# Patient Record
Sex: Female | Born: 1940 | Race: Black or African American | Hispanic: No | Smoking: Never smoker
Health system: Southern US, Community
[De-identification: ages and names within clinical notes are randomized; demographics above are authoritative.]

## PROBLEM LIST (undated history)

## (undated) DIAGNOSIS — C50919 Malignant neoplasm of unspecified site of unspecified female breast: Secondary | ICD-10-CM

## (undated) DIAGNOSIS — I2510 Atherosclerotic heart disease of native coronary artery without angina pectoris: Secondary | ICD-10-CM

## (undated) HISTORY — PX: ABDOMINAL HYSTERECTOMY: SHX81

## (undated) HISTORY — PX: BREAST SURGERY: SHX581

---

## 2005-07-01 ENCOUNTER — Ambulatory Visit (HOSPITAL_COMMUNITY): Admission: RE | Admit: 2005-07-01 | Discharge: 2005-07-01 | Payer: Self-pay | Admitting: Family Medicine

## 2006-01-06 ENCOUNTER — Ambulatory Visit (HOSPITAL_COMMUNITY): Admission: RE | Admit: 2006-01-06 | Discharge: 2006-01-06 | Payer: Self-pay | Admitting: Family Medicine

## 2006-07-14 ENCOUNTER — Ambulatory Visit (HOSPITAL_COMMUNITY): Admission: RE | Admit: 2006-07-14 | Discharge: 2006-07-14 | Payer: Self-pay | Admitting: Family Medicine

## 2007-07-18 ENCOUNTER — Ambulatory Visit (HOSPITAL_COMMUNITY): Admission: RE | Admit: 2007-07-18 | Discharge: 2007-07-18 | Payer: Self-pay | Admitting: Family Medicine

## 2007-07-26 ENCOUNTER — Encounter: Admission: RE | Admit: 2007-07-26 | Discharge: 2007-07-26 | Payer: Self-pay | Admitting: Family Medicine

## 2007-07-26 ENCOUNTER — Encounter (INDEPENDENT_AMBULATORY_CARE_PROVIDER_SITE_OTHER): Payer: Self-pay | Admitting: Diagnostic Radiology

## 2007-08-14 ENCOUNTER — Encounter: Admission: RE | Admit: 2007-08-14 | Discharge: 2007-08-14 | Payer: Self-pay | Admitting: Surgery

## 2007-08-15 ENCOUNTER — Encounter: Admission: RE | Admit: 2007-08-15 | Discharge: 2007-08-15 | Payer: Self-pay | Admitting: Surgery

## 2007-08-16 ENCOUNTER — Encounter: Admission: RE | Admit: 2007-08-16 | Discharge: 2007-08-16 | Payer: Self-pay | Admitting: Surgery

## 2007-08-16 ENCOUNTER — Encounter (INDEPENDENT_AMBULATORY_CARE_PROVIDER_SITE_OTHER): Payer: Self-pay | Admitting: Surgery

## 2007-08-16 ENCOUNTER — Ambulatory Visit (HOSPITAL_BASED_OUTPATIENT_CLINIC_OR_DEPARTMENT_OTHER): Admission: RE | Admit: 2007-08-16 | Discharge: 2007-08-16 | Payer: Self-pay | Admitting: Surgery

## 2007-08-22 ENCOUNTER — Encounter: Admission: RE | Admit: 2007-08-22 | Discharge: 2007-08-22 | Payer: Self-pay | Admitting: Surgery

## 2007-09-28 ENCOUNTER — Ambulatory Visit: Admission: RE | Admit: 2007-09-28 | Discharge: 2007-12-13 | Payer: Self-pay | Admitting: Radiation Oncology

## 2008-04-03 ENCOUNTER — Encounter: Admission: RE | Admit: 2008-04-03 | Discharge: 2008-04-03 | Payer: Self-pay | Admitting: Radiation Oncology

## 2008-05-01 ENCOUNTER — Encounter: Admission: RE | Admit: 2008-05-01 | Discharge: 2008-05-01 | Payer: Self-pay | Admitting: Radiation Oncology

## 2008-06-18 ENCOUNTER — Ambulatory Visit: Payer: Self-pay | Admitting: Gastroenterology

## 2008-06-18 ENCOUNTER — Ambulatory Visit (HOSPITAL_COMMUNITY): Admission: RE | Admit: 2008-06-18 | Discharge: 2008-06-18 | Payer: Self-pay | Admitting: Gastroenterology

## 2008-07-18 ENCOUNTER — Encounter: Admission: RE | Admit: 2008-07-18 | Discharge: 2008-07-18 | Payer: Self-pay | Admitting: Radiation Oncology

## 2009-07-25 ENCOUNTER — Ambulatory Visit (HOSPITAL_COMMUNITY): Admission: RE | Admit: 2009-07-25 | Discharge: 2009-07-25 | Payer: Self-pay | Admitting: Family Medicine

## 2010-07-30 ENCOUNTER — Ambulatory Visit (HOSPITAL_COMMUNITY): Admission: RE | Admit: 2010-07-30 | Discharge: 2010-07-30 | Payer: Self-pay | Admitting: Family Medicine

## 2011-01-18 ENCOUNTER — Encounter: Payer: Self-pay | Admitting: Family Medicine

## 2011-05-12 NOTE — Op Note (Signed)
Brittany Archer, Brittany Archer            ACCOUNT NO.:  [id number]   MEDICAL RECORD NO.:  [id number]          PATIENT TYPE:  AMB   LOCATION:  DSC                          FACILITY:  [facility]   PHYSICIAN:  Thomas A. 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   PROCEDURE:  Left breast needle-localized excisional lumpectomy.   SURGEON:  Harriette Bouillon, MD   ANESTHESIA:  LMA with 20 mL of 0.25% Sensorcaine local.   ESTIMATED BLOOD LOSS:  20 mL.   SPECIMEN:  1. Left breast mass with wire calcifications and stereotactic clip,      verified by radiography.  2. Additional marginal tissue submitted.   DRAINS:  None.   INDICATIONS FOR PROCEDURE:  The patient is a 70 year old female found to  have left breast microcalcifications.  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  She presents for excision of this area in an attempt to conserve her  left breast for DCIS.  Procedure was discussed preoperatively with the  risks and indications.  She understands and agrees to proceed.   DESCRIPTION OF PROCEDURE:  The patient was brought to the operating room  and placed supine.  The left breast prepped and draped in a sterile  fashion.  A localizing wire exited the left lateral breast.  After  infiltration of local anesthesia, a curvilinear incision was made around  the wire.  The wire was brought out through the incision and the tissue  around the entire wire tip was excised in its entirety.  This was  oriented and sent for a radiograph, which showed the calcifications,  clip and wire to be present.  I took additional margins of all margins  and placed a suture on the new surgical margins.  Hemostasis was  excellent.  The wound was  closed in layers with 3-0 Vicryl; 4-  0 Monocryl used to close the subcuticular skin.  Dermabond was placed as  a dressing.  All final counts of sponge, needle and instruments were  found be correct at this portion of the case.  The patient was awoken  and taken to Recovery in satisfactory condition.      Thomas A. Cornett, M.D.  Electronically Signed     TAC/MEDQ  D:  08/16/2007  T:  08/17/2007  Job:  [id number]   cc:   [facility], [place]

## 2011-05-12 NOTE — Op Note (Signed)
Brittany Archer, Brittany Archer            ACCOUNT NO.:  [id number]   MEDICAL RECORD NO.:  [id number]          PATIENT TYPE:  AMB   LOCATION:  DAY                           FACILITY:  [facility]   PHYSICIAN:  Kassie Mends, M.D.      DATE OF BIRTH:  05/28/1941   DATE OF PROCEDURE:  DATE OF DISCHARGE:                               OPERATIVE REPORT   REFERRING Brandilee Pies:  Abbey Chatters. Jorene Guest, MD   PROCEDURE:  Colonoscopy.   INDICATION FOR EXAM:  [name] is a 70 year old female who presents  for average risk colon cancer screening.   FINDINGS:  Frequent sigmoid colon diverticula.  Large internal  hemorrhoids.  Otherwise, no polyps, masses, inflammatory changes, or  AVMs seen.   RECOMMENDATIONS:  1. Screening colonoscopy in 10 years.  She should follow high-fiber      diet.  She was given a handout on high-fiber diet, diverticulosis,      and hemorrhoids.   MEDICATIONS:  1. Demerol 50 mg IV.  2. Versed 4 mg IV.   PROCEDURE TECHNIQUE:  Physical exam was performed.  Informed consent was  obtained from the patient after explaining the benefits, risks, and  alternatives to the procedure.  The patient was connected to monitor and  placed in the left lateral position.  Continuous oxygen was provided by  nasal cannula.  IV medicine administered through an indwelling cannula.  After administration of sedation and rectal exam, the patient's rectum  was entered and the scope was advanced under direct visualization to the  cecum.  The scope was removed slowly by carefully examining the color,  texture, anatomy, and integrity of the mucosa on the way out.  The  patient was recovered in endoscopy and discharged home in satisfactory  condition.      Kassie Mends, M.D.  Electronically Signed     SM/MEDQ  D:  06/18/2008  T:  06/18/2008  Job:  [id number]   cc:   Abbey Chatters. [employer]

## 2011-06-29 ENCOUNTER — Other Ambulatory Visit (HOSPITAL_COMMUNITY): Payer: Self-pay | Admitting: Family Medicine

## 2011-06-29 DIAGNOSIS — Z09 Encounter for follow-up examination after completed treatment for conditions other than malignant neoplasm: Secondary | ICD-10-CM

## 2011-08-12 ENCOUNTER — Ambulatory Visit (HOSPITAL_COMMUNITY)
Admission: RE | Admit: 2011-08-12 | Discharge: 2011-08-12 | Disposition: A | Discharge status: Home / Self Care | Payer: Medicare Other | Source: Ambulatory Visit | Attending: Family Medicine | Admitting: Family Medicine

## 2011-08-12 DIAGNOSIS — Z09 Encounter for follow-up examination after completed treatment for conditions other than malignant neoplasm: Secondary | ICD-10-CM

## 2011-08-12 DIAGNOSIS — Z853 Personal history of malignant neoplasm of breast: Secondary | ICD-10-CM | POA: Insufficient documentation

## 2011-09-10 ENCOUNTER — Encounter: Payer: Self-pay | Admitting: *Deleted

## 2011-09-10 DIAGNOSIS — K5730 Diverticulosis of large intestine without perforation or abscess without bleeding: Secondary | ICD-10-CM | POA: Insufficient documentation

## 2011-09-10 DIAGNOSIS — E119 Type 2 diabetes mellitus without complications: Secondary | ICD-10-CM | POA: Insufficient documentation

## 2011-09-10 DIAGNOSIS — I2510 Atherosclerotic heart disease of native coronary artery without angina pectoris: Secondary | ICD-10-CM | POA: Insufficient documentation

## 2011-09-10 DIAGNOSIS — N201 Calculus of ureter: Secondary | ICD-10-CM | POA: Insufficient documentation

## 2011-09-10 DIAGNOSIS — N1330 Unspecified hydronephrosis: Secondary | ICD-10-CM | POA: Insufficient documentation

## 2011-09-10 DIAGNOSIS — Z853 Personal history of malignant neoplasm of breast: Secondary | ICD-10-CM | POA: Insufficient documentation

## 2011-09-10 NOTE — ED Notes (Signed)
Sudden onset rt flank pain with n/v

## 2011-09-11 ENCOUNTER — Emergency Department (HOSPITAL_COMMUNITY): Payer: Medicare Other

## 2011-09-11 ENCOUNTER — Encounter (HOSPITAL_COMMUNITY): Payer: Self-pay | Admitting: Emergency Medicine

## 2011-09-11 ENCOUNTER — Emergency Department (HOSPITAL_COMMUNITY)
Admission: EM | Admit: 2011-09-11 | Discharge: 2011-09-11 | Disposition: A | Discharge status: Home / Self Care | Payer: Medicare Other | Attending: Emergency Medicine | Admitting: Emergency Medicine

## 2011-09-11 DIAGNOSIS — N200 Calculus of kidney: Secondary | ICD-10-CM

## 2011-09-11 HISTORY — DX: Atherosclerotic heart disease of native coronary artery without angina pectoris: I25.10

## 2011-09-11 HISTORY — DX: Malignant neoplasm of unspecified site of unspecified female breast: C50.919

## 2011-09-11 LAB — CBC
HCT: 31.5 % — ABNORMAL LOW (ref 36.0–46.0)
Hemoglobin: 10.9 g/dL — ABNORMAL LOW (ref 12.0–15.0)
MCH: 32.7 pg (ref 26.0–34.0)
RBC: 3.33 MIL/uL — ABNORMAL LOW (ref 3.87–5.11)

## 2011-09-11 LAB — COMPREHENSIVE METABOLIC PANEL
ALT: 6 U/L (ref 0–35)
AST: 11 U/L (ref 0–37)
CO2: 23 mEq/L (ref 19–32)
Chloride: 101 mEq/L (ref 96–112)
GFR calc non Af Amer: >60 mL/min (ref >60)
Glucose, Bld: 207 mg/dL — ABNORMAL HIGH (ref 70–99)
Sodium: 136 mEq/L (ref 135–145)
Total Bilirubin: 0.3 mg/dL (ref 0.3–1.2)

## 2011-09-11 LAB — DIFFERENTIAL
Eosinophils Absolute: 0 10*3/uL (ref 0.0–0.7)
Lymphs Abs: 0.7 10*3/uL (ref 0.7–4.0)
Monocytes Absolute: 0.3 10*3/uL (ref 0.1–1.0)
Monocytes Relative: 3 % (ref 3–12)
Neutro Abs: 8.9 10*3/uL — ABNORMAL HIGH (ref 1.7–7.7)
Neutrophils Relative %: 90 % — ABNORMAL HIGH (ref 43–77)

## 2011-09-11 LAB — LIPASE, BLOOD: Lipase: 35 U/L (ref 11–59)

## 2011-09-11 LAB — URINALYSIS, ROUTINE W REFLEX MICROSCOPIC
Bilirubin Urine: NEGATIVE
Glucose, UA: 250 mg/dL — AB
Nitrite: NEGATIVE
Specific Gravity, Urine: >1.03 — ABNORMAL HIGH (ref 1.005–1.030)
pH: 6 (ref 5.0–8.0)

## 2011-09-11 MED ORDER — SODIUM CHLORIDE 0.9 % IV SOLN
Freq: Once | INTRAVENOUS | Status: AC
  Administered 2011-09-11: 01:00:00 via INTRAVENOUS

## 2011-09-11 MED ORDER — IOHEXOL 300 MG/ML  SOLN
100.0000 mL | Freq: Once | INTRAMUSCULAR | Status: AC | PRN
  Administered 2011-09-11: 100 mL via INTRAVENOUS

## 2011-09-11 MED ORDER — SODIUM CHLORIDE 0.9 % IV BOLUS (SEPSIS): 500.0000 mL | Freq: Once | INTRAVENOUS | Status: DC

## 2011-09-11 MED ORDER — MORPHINE SULFATE 4 MG/ML IJ SOLN
4.0000 mg | Freq: Once | INTRAMUSCULAR | Status: AC
  Administered 2011-09-11: 4 mg via INTRAVENOUS
  Filled 2011-09-11: qty 1

## 2011-09-11 MED ORDER — ONDANSETRON HCL 4 MG/2ML IJ SOLN
4.0000 mg | Freq: Once | INTRAMUSCULAR | Status: AC
  Administered 2011-09-11: 4 mg via INTRAVENOUS
  Filled 2011-09-11: qty 2

## 2011-09-11 MED ORDER — TAMSULOSIN HCL 0.4 MG PO CAPS: 0.4000 mg | ORAL_CAPSULE | Freq: Two times a day (BID) | ORAL | Status: DC

## 2011-09-11 MED ORDER — HYDROCODONE-ACETAMINOPHEN 5-500 MG PO TABS: 1.0000 | ORAL_TABLET | Freq: Four times a day (QID) | ORAL | Status: AC | PRN

## 2011-09-11 NOTE — ED Provider Notes (Signed)
History     CSN: [id number] Arrival date & time: 09/11/2011 12:00 AM   Chief Complaint  Patient presents with  . Flank Pain     (Include location/radiation/quality/duration/timing/severity/associated sxs/prior treatment) HPI Comments: Gradual onset of right flank and side pain with radiation to the right lower cluttered and right side. Associated with nausea and dizziness. Symptoms are constant, nothing makes better or worse. She has no associated dysuria, diarrhea, rectal bleeding. He has never had symptoms like this before and has had a C-section many years ago. No other abdominal surgeries.  Family state that she had fried some chicken tonight and had some warned up being dispensed leftovers. He denies any other symptoms such as diarrhea  Patient is a 70 y.o. female presenting with flank pain. The history is provided by the patient and a relative.  Flank Pain This is a new problem. Episode onset: 7:00 PM 4 hours prior to arrival. The problem occurs constantly. The problem has not changed since onset.Associated symptoms include abdominal pain. Pertinent negatives include no chest pain, no headaches and no shortness of breath. The symptoms are aggravated by nothing. The symptoms are relieved by nothing. She has tried nothing for the symptoms.     Past Medical History  Diagnosis Date  . Diabetes mellitus   . Coronary artery disease   . Breast cancer      Past Surgical History  Procedure Date  . Abdominal hysterectomy   . Breast surgery     History reviewed. No pertinent family history.  History  Substance Use Topics  . Smoking status: Never Smoker   . Smokeless tobacco: Not on file  . Alcohol Use: No    OB History    Grav Para Term Preterm Abortions TAB SAB Ect Mult Living                  Review of Systems  Constitutional: Negative for fever and chills.  HENT: Negative for congestion, rhinorrhea, neck pain and neck stiffness.   Eyes: Negative for visual  disturbance.  Respiratory: Negative for shortness of breath.   Cardiovascular: Negative for chest pain.  Gastrointestinal: Positive for nausea and abdominal pain. Negative for vomiting, diarrhea, constipation and blood in stool.  Genitourinary: Positive for flank pain.  Musculoskeletal: Negative for back pain.  Neurological: Positive for dizziness. Negative for weakness and headaches.  Hematological: Negative for adenopathy.  All other systems reviewed and are negative.    Allergies  Motrin  Home Medications   Current Outpatient Rx  Name Route Sig Dispense Refill  . ASPIRIN 81 MG PO TABS Oral Take 81 mg by mouth daily.      Marland Kitchen GLIPIZIDE 5 MG PO TABS Oral Take 5 mg by mouth 1 day or 1 dose.      Marland Kitchen LISINOPRIL-HYDROCHLOROTHIAZIDE 10-12.5 MG PO TABS Oral Take 1 tablet by mouth daily.      Marland Kitchen METFORMIN HCL 1000 MG PO TABS Oral Take 1,000 mg by mouth 2 times daily at 12 noon and 4 pm.        Physical Exam    BP 137/43  Pulse 57  Temp(Src) 97.6 F (36.4 C) (Oral)  Resp 20  Ht 5\' 3"  (1.6 m)  Wt 184 lb (83.462 kg)  BMI 32.59 kg/m2  SpO2 96%  Physical Exam  Nursing note and vitals reviewed. Constitutional: She appears well-developed and well-nourished. No distress.  HENT:  Head: Normocephalic and atraumatic.  Mouth/Throat: Oropharynx is clear and moist. No oropharyngeal exudate.  Eyes: Conjunctivae  and EOM are normal. Pupils are equal, round, and reactive to light. Right eye exhibits no discharge. Left eye exhibits no discharge. No scleral icterus.  Neck: Normal range of motion. Neck supple. No JVD present. No thyromegaly present.  Cardiovascular: Normal rate, regular rhythm, normal heart sounds and intact distal pulses.  Exam reveals no gallop and no friction rub.   No murmur heard. Pulmonary/Chest: Effort normal and breath sounds normal. No respiratory distress. She has no wheezes. She has no rales.  Abdominal: Soft. Bowel sounds are normal. She exhibits no distension and no  mass. There is tenderness (Tender to palpation on the right side of the mid abdomen. This part of the abdomen is tympanitic to percussion. There is minimal pain in the right upper cluttered and no pain to the right lower cautery to palpation. No left-sided abdominal tenderness,).       Non-peritoneal, decreased bowel sounds  Musculoskeletal: Normal range of motion. She exhibits no edema and no tenderness.  Lymphadenopathy:    She has no cervical adenopathy.  Neurological: She is alert. Coordination normal.       Clear speech, follows commands, no nystagmus.  Skin: Skin is warm and dry. No rash noted. No erythema.  Psychiatric: She has a normal mood and affect. Her behavior is normal.    ED Course  Procedures  Results for orders placed during the hospital encounter of 09/11/11  URINALYSIS, ROUTINE W REFLEX MICROSCOPIC      Component Value Range   Color, Urine YELLOW  YELLOW    Appearance CLEAR  CLEAR    Specific Gravity, Urine >1.030 (*) 1.005 - 1.030    pH 6.0  5.0 - 8.0    Glucose, UA 250 (*) NEGATIVE (mg/dL)   Hgb urine dipstick LARGE (*) NEGATIVE    Bilirubin Urine NEGATIVE  NEGATIVE    Ketones, ur 15 (*) NEGATIVE (mg/dL)   Protein, ur NEGATIVE  NEGATIVE (mg/dL)   Urobilinogen, UA 0.2  0.0 - 1.0 (mg/dL)   Nitrite NEGATIVE  NEGATIVE    Leukocytes, UA NEGATIVE  NEGATIVE   CBC      Component Value Range   WBC 10.0  4.0 - 10.5 (K/uL)   RBC 3.33 (*) 3.87 - 5.11 (MIL/uL)   Hemoglobin 10.9 (*) 12.0 - 15.0 (g/dL)   HCT 16.1 (*) 09.6 - 46.0 (%)   MCV 94.6  78.0 - 100.0 (fL)   MCH 32.7  26.0 - 34.0 (pg)   MCHC 34.6  30.0 - 36.0 (g/dL)   RDW 04.5  40.9 - 81.1 (%)   Platelets 232  150 - 400 (K/uL)  DIFFERENTIAL      Component Value Range   Neutrophils Relative 90 (*) 43 - 77 (%)   Neutro Abs 8.9 (*) 1.7 - 7.7 (K/uL)   Lymphocytes Relative 7 (*) 12 - 46 (%)   Lymphs Abs 0.7  0.7 - 4.0 (K/uL)   Monocytes Relative 3  3 - 12 (%)   Monocytes Absolute 0.3  0.1 - 1.0 (K/uL)    Eosinophils Relative 0  0 - 5 (%)   Eosinophils Absolute 0.0  0.0 - 0.7 (K/uL)   Basophils Relative 0  0 - 1 (%)   Basophils Absolute 0.0  0.0 - 0.1 (K/uL)  LACTIC ACID, PLASMA      Component Value Range   Lactic Acid, Venous 2.8 (*) 0.5 - 2.2 (mmol/L)  LIPASE, BLOOD      Component Value Range   Lipase 35  11 - 59 (U/L)  COMPREHENSIVE METABOLIC PANEL      Component Value Range   Sodium 136  135 - 145 (mEq/L)   Potassium 3.6  3.5 - 5.1 (mEq/L)   Chloride 101  96 - 112 (mEq/L)   CO2 23  19 - 32 (mEq/L)   Glucose, Bld 207 (*) 70 - 99 (mg/dL)   BUN 20  6 - 23 (mg/dL)   Creatinine, Ser 6.96  0.50 - 1.10 (mg/dL)   Calcium 9.8  8.4 - 29.5 (mg/dL)   Total Protein 7.6  6.0 - 8.3 (g/dL)   Albumin 3.8  3.5 - 5.2 (g/dL)   AST 11  0 - 37 (U/L)   ALT 6  0 - 35 (U/L)   Alkaline Phosphatase 70  39 - 117 (U/L)   Total Bilirubin 0.3  0.3 - 1.2 (mg/dL)   GFR calc non Af Amer >60  >60 (mL/min)   GFR calc Af Amer >60  >60 (mL/min)  URINE MICROSCOPIC-ADD ON      Component Value Range   Squamous Epithelial / LPF MANY (*) RARE    WBC, UA 0-2  <3 (WBC/hpf)   RBC / HPF 3-6  <3 (RBC/hpf)   Bacteria, UA RARE  RARE      MDM Patient with right-sided abdominal pain constant for the last 4 hours, possibly associated with tainted food. Will need lab work do to tenderness and tympanitic sounds. CT scan to rule out other sources. IV placed, fluids, antiemetics, analgesics.  IMPRESSION:  1. Moderate right-sided hydronephrosis, with diffuse right-sided perinephric fluid and stranding, and an obstructing 3 mm stone at the right vesicoureteral junction. 2. Small left renal cyst and possible small hepatic cyst noted.  3. Diffuse diverticulosis along the distal descending and proximal sigmoid colon, without evidence of diverticulitis.  Original Report Authenticated By: Tonia Ghent, M.D.  Findings discussed with patient, medications given, and improved status and will followup as needed.    Vida Roller, MD 09/11/11 [phone]

## 2011-09-11 NOTE — ED Notes (Signed)
Pt states pain in abd started around 7 pm, has vomited several times since then and is now also dizzy.  Pt denies diarrhea.

## 2011-10-06 ENCOUNTER — Other Ambulatory Visit (HOSPITAL_COMMUNITY): Payer: Self-pay | Admitting: Urology

## 2011-10-06 DIAGNOSIS — IMO0002 Reserved for concepts with insufficient information to code with codable children: Secondary | ICD-10-CM

## 2011-10-09 LAB — COMPREHENSIVE METABOLIC PANEL
ALT: 12
AST: 19
Albumin: 3.9
Alkaline Phosphatase: 72
BUN: 6
CO2: 29
Calcium: 9.9
Chloride: 104
Creatinine, Ser: 0.66
GFR calc Af Amer: >60
GFR calc non Af Amer: >60
Glucose, Bld: 105 — ABNORMAL HIGH
Potassium: 4.7
Sodium: 140
Total Bilirubin: 0.7
Total Protein: 7.8

## 2011-10-09 LAB — CBC
HCT: 34.7 — ABNORMAL LOW
Hemoglobin: 12.1
MCHC: 34.8
MCV: 93.1
Platelets: 258
RBC: 3.73 — ABNORMAL LOW
RDW: 13.2
WBC: 4.3

## 2011-10-09 LAB — DIFFERENTIAL
Eosinophils Absolute: 0.1
Lymphocytes Relative: 38
Lymphs Abs: 1.7
Neutrophils Relative %: 55

## 2011-10-20 ENCOUNTER — Ambulatory Visit (HOSPITAL_COMMUNITY)
Admission: RE | Admit: 2011-10-20 | Discharge: 2011-10-20 | Disposition: A | Discharge status: Home / Self Care | Payer: Medicare Other | Source: Ambulatory Visit | Attending: Urology | Admitting: Urology

## 2011-10-20 ENCOUNTER — Encounter (HOSPITAL_COMMUNITY): Payer: Self-pay

## 2011-10-20 DIAGNOSIS — N201 Calculus of ureter: Secondary | ICD-10-CM | POA: Insufficient documentation

## 2011-10-20 DIAGNOSIS — R109 Unspecified abdominal pain: Secondary | ICD-10-CM | POA: Insufficient documentation

## 2011-10-20 DIAGNOSIS — Z853 Personal history of malignant neoplasm of breast: Secondary | ICD-10-CM | POA: Insufficient documentation

## 2011-10-20 DIAGNOSIS — IMO0002 Reserved for concepts with insufficient information to code with codable children: Secondary | ICD-10-CM

## 2011-10-20 DIAGNOSIS — K5730 Diverticulosis of large intestine without perforation or abscess without bleeding: Secondary | ICD-10-CM | POA: Insufficient documentation

## 2012-07-28 ENCOUNTER — Other Ambulatory Visit (HOSPITAL_COMMUNITY): Payer: Self-pay | Admitting: Nurse Practitioner

## 2012-07-28 DIAGNOSIS — Z9889 Other specified postprocedural states: Secondary | ICD-10-CM

## 2012-08-17 ENCOUNTER — Other Ambulatory Visit (HOSPITAL_COMMUNITY): Payer: Self-pay | Admitting: Nurse Practitioner

## 2012-08-17 ENCOUNTER — Ambulatory Visit (HOSPITAL_COMMUNITY)
Admission: RE | Admit: 2012-08-17 | Discharge: 2012-08-17 | Disposition: A | Discharge status: Home / Self Care | Payer: Medicare Other | Source: Ambulatory Visit | Attending: Nurse Practitioner | Admitting: Nurse Practitioner

## 2012-08-17 DIAGNOSIS — Z853 Personal history of malignant neoplasm of breast: Secondary | ICD-10-CM | POA: Insufficient documentation

## 2012-08-17 DIAGNOSIS — Z9889 Other specified postprocedural states: Secondary | ICD-10-CM

## 2012-08-17 DIAGNOSIS — C50912 Malignant neoplasm of unspecified site of left female breast: Secondary | ICD-10-CM

## 2013-08-16 ENCOUNTER — Encounter (HOSPITAL_COMMUNITY): Payer: Medicare Other

## 2013-08-23 ENCOUNTER — Ambulatory Visit (HOSPITAL_COMMUNITY)
Admission: RE | Admit: 2013-08-23 | Discharge: 2013-08-23 | Disposition: A | Discharge status: Home / Self Care | Payer: Medicare Other | Source: Ambulatory Visit | Attending: Nurse Practitioner | Admitting: Nurse Practitioner

## 2013-08-23 DIAGNOSIS — C50912 Malignant neoplasm of unspecified site of left female breast: Secondary | ICD-10-CM

## 2013-08-23 DIAGNOSIS — Z853 Personal history of malignant neoplasm of breast: Secondary | ICD-10-CM | POA: Insufficient documentation

## 2014-07-20 ENCOUNTER — Other Ambulatory Visit (HOSPITAL_COMMUNITY): Payer: Self-pay | Admitting: Family Medicine

## 2014-07-20 DIAGNOSIS — C50912 Malignant neoplasm of unspecified site of left female breast: Secondary | ICD-10-CM

## 2014-08-28 ENCOUNTER — Ambulatory Visit (HOSPITAL_COMMUNITY)
Admission: RE | Admit: 2014-08-28 | Discharge: 2014-08-28 | Disposition: A | Discharge status: Home / Self Care | Payer: Medicare Other | Source: Ambulatory Visit | Attending: Family Medicine | Admitting: Family Medicine

## 2014-08-28 DIAGNOSIS — Z853 Personal history of malignant neoplasm of breast: Secondary | ICD-10-CM | POA: Insufficient documentation

## 2014-08-28 DIAGNOSIS — C50912 Malignant neoplasm of unspecified site of left female breast: Secondary | ICD-10-CM

## 2014-08-28 DIAGNOSIS — Z9010 Acquired absence of unspecified breast and nipple: Secondary | ICD-10-CM | POA: Diagnosis not present

## 2015-07-26 ENCOUNTER — Other Ambulatory Visit (HOSPITAL_COMMUNITY): Payer: Self-pay | Admitting: Family Medicine

## 2015-07-26 DIAGNOSIS — Z1231 Encounter for screening mammogram for malignant neoplasm of breast: Secondary | ICD-10-CM

## 2015-09-04 ENCOUNTER — Ambulatory Visit (HOSPITAL_COMMUNITY)
Admission: RE | Admit: 2015-09-04 | Discharge: 2015-09-04 | Disposition: A | Discharge status: Home / Self Care | Payer: Medicare Other | Source: Ambulatory Visit | Attending: Family Medicine | Admitting: Family Medicine

## 2015-09-04 DIAGNOSIS — Z853 Personal history of malignant neoplasm of breast: Secondary | ICD-10-CM | POA: Insufficient documentation

## 2015-09-04 DIAGNOSIS — Z923 Personal history of irradiation: Secondary | ICD-10-CM | POA: Diagnosis not present

## 2015-09-04 DIAGNOSIS — Z1231 Encounter for screening mammogram for malignant neoplasm of breast: Secondary | ICD-10-CM

## 2016-08-03 ENCOUNTER — Other Ambulatory Visit (HOSPITAL_COMMUNITY): Payer: Self-pay | Admitting: Physician Assistant

## 2016-08-03 DIAGNOSIS — Z1231 Encounter for screening mammogram for malignant neoplasm of breast: Secondary | ICD-10-CM

## 2016-09-07 ENCOUNTER — Ambulatory Visit (HOSPITAL_COMMUNITY)
Admission: RE | Admit: 2016-09-07 | Discharge: 2016-09-07 | Disposition: A | Discharge status: Home / Self Care | Payer: Medicare Other | Source: Ambulatory Visit | Attending: Physician Assistant | Admitting: Physician Assistant

## 2016-09-07 DIAGNOSIS — Z1231 Encounter for screening mammogram for malignant neoplasm of breast: Secondary | ICD-10-CM | POA: Diagnosis present

## 2017-07-28 ENCOUNTER — Other Ambulatory Visit (HOSPITAL_COMMUNITY): Payer: Self-pay | Admitting: Physician Assistant

## 2017-07-28 DIAGNOSIS — Z1231 Encounter for screening mammogram for malignant neoplasm of breast: Secondary | ICD-10-CM

## 2017-09-15 ENCOUNTER — Ambulatory Visit (HOSPITAL_COMMUNITY)
Admission: RE | Admit: 2017-09-15 | Discharge: 2017-09-15 | Disposition: A | Discharge status: Home / Self Care | Payer: Medicare Other | Source: Ambulatory Visit | Attending: Physician Assistant | Admitting: Physician Assistant

## 2017-09-15 DIAGNOSIS — Z1231 Encounter for screening mammogram for malignant neoplasm of breast: Secondary | ICD-10-CM | POA: Diagnosis present

## 2017-09-19 ENCOUNTER — Emergency Department (HOSPITAL_COMMUNITY)
Admission: EM | Admit: 2017-09-19 | Discharge: 2017-09-19 | Disposition: A | Discharge status: Home / Self Care | Payer: Medicare Other | Attending: Emergency Medicine | Admitting: Emergency Medicine

## 2017-09-19 DIAGNOSIS — L089 Local infection of the skin and subcutaneous tissue, unspecified: Secondary | ICD-10-CM | POA: Insufficient documentation

## 2017-09-19 DIAGNOSIS — Z7984 Long term (current) use of oral hypoglycemic drugs: Secondary | ICD-10-CM | POA: Diagnosis not present

## 2017-09-19 DIAGNOSIS — Z853 Personal history of malignant neoplasm of breast: Secondary | ICD-10-CM | POA: Diagnosis not present

## 2017-09-19 DIAGNOSIS — E119 Type 2 diabetes mellitus without complications: Secondary | ICD-10-CM | POA: Diagnosis not present

## 2017-09-19 DIAGNOSIS — Z7982 Long term (current) use of aspirin: Secondary | ICD-10-CM | POA: Insufficient documentation

## 2017-09-19 DIAGNOSIS — I2510 Atherosclerotic heart disease of native coronary artery without angina pectoris: Secondary | ICD-10-CM | POA: Diagnosis not present

## 2017-09-19 DIAGNOSIS — R600 Localized edema: Secondary | ICD-10-CM | POA: Diagnosis present

## 2017-09-19 LAB — COMPREHENSIVE METABOLIC PANEL
ALBUMIN: 4.3 g/dL (ref 3.5–5.0)
ALK PHOS: 67 U/L (ref 38–126)
ALT: 11 U/L — ABNORMAL LOW (ref 14–54)
ANION GAP: 12 (ref 5–15)
AST: 17 U/L (ref 15–41)
BILIRUBIN TOTAL: 0.5 mg/dL (ref 0.3–1.2)
BUN: 18 mg/dL (ref 6–20)
CALCIUM: 10.3 mg/dL (ref 8.9–10.3)
CO2: 25 mmol/L (ref 22–32)
Chloride: 102 mmol/L (ref 101–111)
Creatinine, Ser: 1.02 mg/dL — ABNORMAL HIGH (ref 0.44–1.00)
GFR calc Af Amer: 60 mL/min — ABNORMAL LOW (ref >60)
GFR, EST NON AFRICAN AMERICAN: 52 mL/min — AB (ref >60)
GLUCOSE: 94 mg/dL (ref 65–99)
Potassium: 3.9 mmol/L (ref 3.5–5.1)
Sodium: 139 mmol/L (ref 135–145)
Total Protein: 8.7 g/dL — ABNORMAL HIGH (ref 6.5–8.1)

## 2017-09-19 LAB — CBC WITH DIFFERENTIAL/PLATELET
Basophils Absolute: 0 10*3/uL (ref 0.0–0.1)
Basophils Relative: 0 %
Eosinophils Absolute: 0.1 10*3/uL (ref 0.0–0.7)
Eosinophils Relative: 1 %
HEMATOCRIT: 32.6 % — AB (ref 36.0–46.0)
HEMOGLOBIN: 10.7 g/dL — AB (ref 12.0–15.0)
LYMPHS ABS: 2 10*3/uL (ref 0.7–4.0)
LYMPHS PCT: 19 %
MCH: 32.8 pg (ref 26.0–34.0)
MCHC: 32.8 g/dL (ref 30.0–36.0)
MCV: 100 fL (ref 78.0–100.0)
MONOS PCT: 9 %
Monocytes Absolute: 0.9 10*3/uL (ref 0.1–1.0)
NEUTROS PCT: 71 %
Neutro Abs: 7.8 10*3/uL — ABNORMAL HIGH (ref 1.7–7.7)
Platelets: 275 10*3/uL (ref 150–400)
RBC: 3.26 MIL/uL — ABNORMAL LOW (ref 3.87–5.11)
RDW: 14.8 % (ref 11.5–15.5)
WBC: 10.8 10*3/uL — AB (ref 4.0–10.5)

## 2017-09-19 MED ORDER — ACETAMINOPHEN 325 MG PO TABS
650.0000 mg | ORAL_TABLET | Freq: Once | ORAL | Status: AC
  Administered 2017-09-19: 650 mg via ORAL
  Filled 2017-09-19: qty 2

## 2017-09-19 MED ORDER — LIDOCAINE HCL (PF) 1 % IJ SOLN
INTRAMUSCULAR | Status: AC
  Administered 2017-09-19: 20:00:00
  Filled 2017-09-19: qty 4

## 2017-09-19 MED ORDER — DIPHENHYDRAMINE HCL 50 MG/ML IJ SOLN
25.0000 mg | Freq: Once | INTRAMUSCULAR | Status: AC
  Administered 2017-09-19: 25 mg via INTRAVENOUS
  Filled 2017-09-19: qty 1

## 2017-09-19 MED ORDER — SULFAMETHOXAZOLE-TRIMETHOPRIM 800-160 MG PO TABS: 1.0000 | ORAL_TABLET | Freq: Two times a day (BID) | ORAL | 0 refills | Status: AC

## 2017-09-19 MED ORDER — FAMOTIDINE IN NACL 20-0.9 MG/50ML-% IV SOLN
20.0000 mg | Freq: Once | INTRAVENOUS | Status: AC
  Administered 2017-09-19: 20 mg via INTRAVENOUS
  Filled 2017-09-19: qty 50

## 2017-09-19 MED ORDER — CEFTRIAXONE SODIUM 1 G IJ SOLR
1.0000 g | Freq: Once | INTRAMUSCULAR | Status: AC
  Administered 2017-09-19: 1 g via INTRAMUSCULAR
  Filled 2017-09-19: qty 10

## 2017-09-19 NOTE — ED Notes (Signed)
Dr Roderic Palau notified of pt's temp, additional orders received,

## 2017-09-19 NOTE — Discharge Instructions (Signed)
Follow-up with your doctor in 2-3 days for recheck return if getting worse

## 2017-09-19 NOTE — ED Triage Notes (Signed)
Patient c/o oral swelling that started yesterday. Per patient hit one of her teeth 2 days ago. Denies any fevers. Significant swelling noted to left jaw. Denies any difficulty swallowing or breathing. Patient also has some notable orbital swelling.

## 2017-09-19 NOTE — ED Provider Notes (Signed)
[facility] DEPT Provider Note   CSN: [id number] Arrival date & time: 09/19/17  1422     History   Chief Complaint Chief Complaint  Patient presents with  . Oral Swelling    HPI Brittany Archer is a 76 y.o. female.  Patient complains of swelling around her left side of her nose and left eye. Patient also has some tenderness   The history is provided by the patient.  Illness  This is a new problem. The problem occurs constantly. The problem has not changed since onset.Pertinent negatives include no chest pain, no abdominal pain and no headaches. Nothing aggravates the symptoms. Nothing relieves the symptoms.    Past Medical History:  Diagnosis Date  . Breast cancer    left breast/ 2008/ surg/ rad tx  . Coronary artery disease   . Diabetes mellitus     There are no active problems to display for this patient.   Past Surgical History:  Procedure Laterality Date  . ABDOMINAL HYSTERECTOMY    . BREAST SURGERY      OB History    No data available       Home Medications    Prior to Admission medications   Medication Sig Start Date End Date Taking? Authorizing Provider  acetaminophen (TYLENOL) 500 MG tablet Take 500 mg by mouth every 6 (six) hours as needed for mild pain or moderate pain.   Yes [provider]  aspirin 81 MG tablet Take 81 mg by mouth daily.     Yes [provider]  glipiZIDE (GLUCOTROL) 5 MG tablet Take 5 mg by mouth daily.    Yes [provider]  lisinopril-hydrochlorothiazide (PRINZIDE,ZESTORETIC) 10-12.5 MG per tablet Take 1 tablet by mouth daily.     Yes [provider]  metFORMIN (GLUCOPHAGE) 1000 MG tablet Take 1,000 mg by mouth 2 times daily at 12 noon and 4 pm.     Yes [provider]  sulfamethoxazole-trimethoprim (BACTRIM DS,SEPTRA DS) 800-160 MG tablet Take 1 tablet by mouth 2 (two) times daily. 09/19/17 09/26/17  Milton Ferguson, MD    Family History No family history on file.  Social  History Social History  Substance Use Topics  . Smoking status: Never Smoker  . Smokeless tobacco: Not on file  . Alcohol use No     Allergies   Motrin [ibuprofen]   Review of Systems Review of Systems  Constitutional: Negative for appetite change and fatigue.  HENT: Negative for congestion, ear discharge and sinus pressure.        Facial swelling  Eyes: Negative for discharge.  Respiratory: Negative for cough.   Cardiovascular: Negative for chest pain.  Gastrointestinal: Negative for abdominal pain and diarrhea.  Genitourinary: Negative for frequency and hematuria.  Musculoskeletal: Negative for back pain.  Skin: Negative for rash.  Neurological: Negative for seizures and headaches.  Psychiatric/Behavioral: Negative for hallucinations.     Physical Exam Updated Vital Signs BP (!) 160/69   Pulse 69   Temp 98.4 F (36.9 C) (Oral)   Resp 18   Ht 5\' 3"  (1.6 m)   Wt 81.8 kg (180 lb 6.4 oz)   SpO2 95%   BMI 31.96 kg/m   Physical Exam  Constitutional: She is oriented to person, place, and time. She appears well-developed.  HENT:  Head: Normocephalic.  Patient has swelling and tenderness to left side of nose which appears to be mild cellulitis  Eyes: Conjunctivae and EOM are normal. No scleral icterus.  Neck: Neck supple. No  thyromegaly present.  Cardiovascular: Normal rate and regular rhythm.  Exam reveals no gallop and no friction rub.   No murmur heard. Pulmonary/Chest: No stridor. She has no wheezes. She has no rales. She exhibits no tenderness.  Abdominal: She exhibits no distension. There is no tenderness. There is no rebound.  Musculoskeletal: Normal range of motion. She exhibits no edema.  Lymphadenopathy:    She has no cervical adenopathy.  Neurological: She is oriented to person, place, and time. She exhibits normal muscle tone. Coordination normal.  Skin: No rash noted. No erythema.  Psychiatric: She has a normal mood and affect. Her behavior is normal.       ED Treatments / Results  Labs (all labs ordered are listed, but only abnormal results are displayed) Labs Reviewed  CBC WITH DIFFERENTIAL/PLATELET - Abnormal; Notable for the following:       Result Value   WBC 10.8 (*)    RBC 3.26 (*)    Hemoglobin 10.7 (*)    HCT 32.6 (*)    Neutro Abs 7.8 (*)    All other components within normal limits  COMPREHENSIVE METABOLIC PANEL - Abnormal; Notable for the following:    Creatinine, Ser 1.02 (*)    Total Protein 8.7 (*)    ALT 11 (*)    GFR calc non Af Amer 52 (*)    GFR calc Af Amer 60 (*)    All other components within normal limits    EKG  EKG Interpretation None       Radiology No results found.  Procedures Procedures (including critical care time)  Medications Ordered in ED Medications  famotidine (PEPCID) IVPB 20 mg premix (0 mg Intravenous Stopped 09/19/17 1619)  diphenhydrAMINE (BENADRYL) injection 25 mg (25 mg Intravenous Given 09/19/17 1549)     Initial Impression / Assessment and Plan / ED Course  I have reviewed the triage vital signs and the nursing notes.  Pertinent labs & imaging results that were available during my care of the patient were reviewed by me and considered in my medical decision making (see chart for details).     Patient with cellulitis to the fascial be placed on Bactrim  Final Clinical Impressions(s) / ED Diagnoses   Final diagnoses:  Facial infection    New Prescriptions New Prescriptions   SULFAMETHOXAZOLE-TRIMETHOPRIM (BACTRIM DS,SEPTRA DS) 800-160 MG TABLET    Take 1 tablet by mouth 2 (two) times daily.     Milton Ferguson, MD 09/19/17 [phone]

## 2017-11-25 ENCOUNTER — Encounter (HOSPITAL_COMMUNITY): Payer: Self-pay | Admitting: Oncology

## 2017-11-25 ENCOUNTER — Other Ambulatory Visit: Payer: Self-pay

## 2017-11-25 ENCOUNTER — Encounter (HOSPITAL_COMMUNITY): Payer: Medicare Other | Attending: Oncology | Admitting: Oncology

## 2017-11-25 ENCOUNTER — Encounter (HOSPITAL_COMMUNITY): Payer: Medicare Other

## 2017-11-25 DIAGNOSIS — D508 Other iron deficiency anemias: Secondary | ICD-10-CM | POA: Diagnosis not present

## 2017-11-25 DIAGNOSIS — N183 Chronic kidney disease, stage 3 (moderate): Secondary | ICD-10-CM | POA: Diagnosis not present

## 2017-11-25 DIAGNOSIS — D509 Iron deficiency anemia, unspecified: Secondary | ICD-10-CM | POA: Insufficient documentation

## 2017-11-25 LAB — COMPREHENSIVE METABOLIC PANEL
ALT: 7 U/L — ABNORMAL LOW (ref 14–54)
AST: 14 U/L — AB (ref 15–41)
Albumin: 4.2 g/dL (ref 3.5–5.0)
Alkaline Phosphatase: 69 U/L (ref 38–126)
Anion gap: 9 (ref 5–15)
BILIRUBIN TOTAL: 0.6 mg/dL (ref 0.3–1.2)
BUN: 22 mg/dL — AB (ref 6–20)
CHLORIDE: 106 mmol/L (ref 101–111)
CO2: 26 mmol/L (ref 22–32)
CREATININE: 1.09 mg/dL — AB (ref 0.44–1.00)
Calcium: 10.5 mg/dL — ABNORMAL HIGH (ref 8.9–10.3)
GFR calc non Af Amer: 48 mL/min — ABNORMAL LOW (ref >60)
GFR, EST AFRICAN AMERICAN: 55 mL/min — AB (ref >60)
Glucose, Bld: 61 mg/dL — ABNORMAL LOW (ref 65–99)
POTASSIUM: 3.4 mmol/L — AB (ref 3.5–5.1)
Sodium: 141 mmol/L (ref 135–145)
TOTAL PROTEIN: 8.5 g/dL — AB (ref 6.5–8.1)

## 2017-11-25 LAB — CBC WITH DIFFERENTIAL/PLATELET
BASOS ABS: 0 10*3/uL (ref 0.0–0.1)
Basophils Relative: 0 %
EOS PCT: 1 %
Eosinophils Absolute: 0.1 10*3/uL (ref 0.0–0.7)
HEMATOCRIT: 29.2 % — AB (ref 36.0–46.0)
Hemoglobin: 9.4 g/dL — ABNORMAL LOW (ref 12.0–15.0)
LYMPHS ABS: 1.9 10*3/uL (ref 0.7–4.0)
LYMPHS PCT: 35 %
MCH: 32.4 pg (ref 26.0–34.0)
MCHC: 32.2 g/dL (ref 30.0–36.0)
MCV: 100.7 fL — AB (ref 78.0–100.0)
MONO ABS: 0.3 10*3/uL (ref 0.1–1.0)
MONOS PCT: 6 %
Neutro Abs: 3.2 10*3/uL (ref 1.7–7.7)
Neutrophils Relative %: 58 %
PLATELETS: 269 10*3/uL (ref 150–400)
RBC: 2.9 MIL/uL — ABNORMAL LOW (ref 3.87–5.11)
RDW: 14.5 % (ref 11.5–15.5)
WBC: 5.5 10*3/uL (ref 4.0–10.5)

## 2017-11-25 LAB — VITAMIN B12: Vitamin B-12: 164 pg/mL — ABNORMAL LOW (ref 180–914)

## 2017-11-25 LAB — RETICULOCYTES
RBC.: 2.9 MIL/uL — AB (ref 3.87–5.11)
RETIC CT PCT: 1.3 % (ref 0.4–3.1)
Retic Count, Absolute: 37.7 10*3/uL (ref 19.0–186.0)

## 2017-11-25 LAB — FERRITIN: Ferritin: 26 ng/mL (ref 11–307)

## 2017-11-25 LAB — IRON AND TIBC
IRON: 42 ug/dL (ref 28–170)
Saturation Ratios: 23 % (ref 10.4–31.8)
TIBC: 183 ug/dL — AB (ref 250–450)
UIBC: 141 ug/dL

## 2017-11-25 LAB — FOLATE: Folate: 16.2 ng/mL (ref >5.9)

## 2017-11-25 NOTE — Patient Instructions (Signed)
[place] at [facility] Discharge Instructions  RECOMMENDATIONS MADE BY THE CONSULTANT AND ANY TEST RESULTS WILL BE SENT TO YOUR REFERRING PHYSICIAN.  You were seen today by Dr. Twana First You will have lab work today Follow up for 2 iron infusions We will see you back in 8 weeks with labs   Thank you for choosing [place] at [facility] to provide your oncology and hematology care.  To afford each patient quality time with our provider, please arrive at least 15 minutes before your scheduled appointment time.    If you have a lab appointment with the [facility] please come in thru the  Main Entrance and check in at the main information desk  You need to re-schedule your appointment should you arrive 10 or more minutes late.  We strive to give you quality time with our providers, and arriving late affects you and other patients whose appointments are after yours.  Also, if you no show three or more times for appointments you may be dismissed from the clinic at the providers discretion.     Again, thank you for choosing [facility].  Our hope is that these requests will decrease the amount of time that you wait before being seen by our physicians.       _____________________________________________________________  Should you have questions after your visit to [facility], please contact our office at [phone] between the hours of 8:30 a.m. and 4:30 p.m.  Voicemails left after 4:30 p.m. will not be returned until the following business day.  For prescription refill requests, have your pharmacy contact our office.       Resources For Cancer Patients and their Caregivers ? American Cancer Society: Can assist with transportation, wigs, general needs, runs Look Good Feel Better.        224-661-4704 ? Cancer Care: Provides financial assistance, online support groups, medication/co-pay assistance.   1-800-813-HOPE (260) 036-7099) ? [facility] Assists [place] cancer patients and their families through emotional , educational and financial support.  228-752-6322 ? [place] DSS Where to apply for food stamps, Medicaid and utility assistance. (786) 572-6468 ? [facility]: Transportation to medical appointments. 612-857-6507 ? Social Security Administration: May apply for disability if have a Stage IV cancer. 306-606-8818 4431328959 ? [employer], Disability and Transit Services: Assists with nutrition, care and transit needs. [place] Support Programs: @10RELATIVEDAYS @ > Cancer Support Group  2nd Tuesday of the month 1pm-2pm, Journey Room  > Creative Journey  3rd Tuesday of the month 1130am-1pm, Journey Room  > Look Good Feel Better  1st Wednesday of the month 10am-12 noon, Journey Room (Call [place] to register [phone])

## 2017-11-25 NOTE — Progress Notes (Signed)
[facility] Initial Visit:  Patient Care Team: Vesta Mixer as PCP - General (Physician Assistant)  CHIEF COMPLAINTS/PURPOSE OF CONSULTATION:  Iron deficiency anemia  HISTORY OF PRESENTING ILLNESS: Brittany Archer 76 y.o. female presents today for evaluation of iron deficiency anemia.  She states that she has been doing well and has no complaints today.  She denies any evidence of bleeding including melena, hematochezia, hematuria, hemoptysis.  Her last colonoscopy was more than 5 years ago.  She denies any fatigue, chest pain, shortness of breath, abdominal pain, recent infections, nausea, vomiting, diarrhea.  Blood work on 11/03/2017 demonstrated ferritin 25, serum iron 46, iron binding capacity 152, percent saturation 30, WBC 5.6K, hemoglobin 9.3 g/dL, hematocrit 27.4%, MCV 95.5, platelet count 252K.  Previous CBC from 09/19/2017 demonstrated hemoglobin 10.7 g/dL, hematocrit 32.6%.  Review of Systems - Oncology ROS as per HPI otherwise 12 point ROS is negative.  MEDICAL HISTORY: Past Medical History:  Diagnosis Date  . Breast cancer    left breast/ 2008/ surg/ rad tx  . Coronary artery disease   . Diabetes mellitus     SURGICAL HISTORY: Past Surgical History:  Procedure Laterality Date  . ABDOMINAL HYSTERECTOMY    . BREAST SURGERY      SOCIAL HISTORY: Social History   Socioeconomic History  . Marital status: Divorced    Spouse name: Not on file  . Number of children: Not on file  . Years of education: Not on file  . Highest education level: Not on file  Social Needs  . Financial resource strain: Not on file  . Food insecurity - worry: Not on file  . Food insecurity - inability: Not on file  . Transportation needs - medical: Not on file  . Transportation needs - non-medical: Not on file  Occupational History  . Not on file  Tobacco Use  . Smoking status: Never Smoker  Substance and Sexual Activity  . Alcohol use: No  . Drug use: No   . Sexual activity: Yes    Birth control/protection: Surgical  Other Topics Concern  . Not on file  Social History Narrative  . Not on file    FAMILY HISTORY No family history on file.  ALLERGIES:  is allergic to motrin [ibuprofen].  MEDICATIONS:  Current Outpatient Medications  Medication Sig Dispense Refill  . acetaminophen (TYLENOL) 500 MG tablet Take 500 mg by mouth every 6 (six) hours as needed for mild pain or moderate pain.    Marland Kitchen aspirin 81 MG tablet Take 81 mg by mouth daily.      Marland Kitchen lisinopril-hydrochlorothiazide (PRINZIDE,ZESTORETIC) 10-12.5 MG per tablet Take 1 tablet by mouth daily.      . metFORMIN (GLUCOPHAGE) 1000 MG tablet Take 1,000 mg by mouth 2 times daily at 12 noon and 4 pm.       No current facility-administered medications for this visit.     PHYSICAL EXAMINATION:   Vitals:   11/25/17 1314  BP: (!) 124/57  Pulse: 63  Resp: 16  Temp: 98.4 F (36.9 C)  SpO2: 99%    Filed Weights   11/25/17 1314  Weight: 178 lb 4.8 oz (80.9 kg)     Physical Exam  Constitutional: Well-developed, well-nourished, and in no distress.   HENT:  Head: Normocephalic and atraumatic.  Mouth/Throat: No oropharyngeal exudate. Mucosa moist. Eyes: Pupils are equal, round, and reactive to light. Conjunctivae are normal. No scleral icterus.  Neck: Normal range of motion. Neck supple. No JVD present.  Cardiovascular: Normal rate, regular rhythm and normal heart sounds.  Exam reveals no gallop and no friction rub.   No murmur heard. Pulmonary/Chest: Effort normal and breath sounds normal. No respiratory distress. No wheezes.No rales.  Abdominal: Soft. Bowel sounds are normal. No distension. There is no tenderness. There is no guarding.  Musculoskeletal: No edema or tenderness.  Lymphadenopathy:    No cervical or supraclavicular adenopathy.  Neurological: Alert and oriented to person, place, and time. No cranial nerve deficit.  Skin: Skin is warm and dry. No rash noted. No  erythema. No pallor.  Psychiatric: Affect and judgment normal.    LABORATORY DATA: I have personally reviewed the data as listed:  No visits with results within 1 Month(s) from this visit.  Latest known visit with results is:  Admission on 09/19/2017, Discharged on 09/19/2017  Component Date Value Ref Range Status  . WBC 09/19/2017 10.8* 4.0 - 10.5 K/uL Final  . RBC 09/19/2017 3.26* 3.87 - 5.11 MIL/uL Final  . Hemoglobin 09/19/2017 10.7* 12.0 - 15.0 g/dL Final  . HCT 09/19/2017 32.6* 36.0 - 46.0 % Final  . MCV 09/19/2017 100.0  78.0 - 100.0 fL Final  . MCH 09/19/2017 32.8  26.0 - 34.0 pg Final  . MCHC 09/19/2017 32.8  30.0 - 36.0 g/dL Final  . RDW 09/19/2017 14.8  11.5 - 15.5 % Final  . Platelets 09/19/2017 275  150 - 400 K/uL Final  . Neutrophils Relative % 09/19/2017 71  % Final  . Neutro Abs 09/19/2017 7.8* 1.7 - 7.7 K/uL Final  . Lymphocytes Relative 09/19/2017 19  % Final  . Lymphs Abs 09/19/2017 2.0  0.7 - 4.0 K/uL Final  . Monocytes Relative 09/19/2017 9  % Final  . Monocytes Absolute 09/19/2017 0.9  0.1 - 1.0 K/uL Final  . Eosinophils Relative 09/19/2017 1  % Final  . Eosinophils Absolute 09/19/2017 0.1  0.0 - 0.7 K/uL Final  . Basophils Relative 09/19/2017 0  % Final  . Basophils Absolute 09/19/2017 0.0  0.0 - 0.1 K/uL Final  . Sodium 09/19/2017 139  135 - 145 mmol/L Final  . Potassium 09/19/2017 3.9  3.5 - 5.1 mmol/L Final  . Chloride 09/19/2017 102  101 - 111 mmol/L Final  . CO2 09/19/2017 25  22 - 32 mmol/L Final  . Glucose, Bld 09/19/2017 94  65 - 99 mg/dL Final  . BUN 09/19/2017 18  6 - 20 mg/dL Final  . Creatinine, Ser 09/19/2017 1.02* 0.44 - 1.00 mg/dL Final  . Calcium 09/19/2017 10.3  8.9 - 10.3 mg/dL Final  . Total Protein 09/19/2017 8.7* 6.5 - 8.1 g/dL Final  . Albumin 09/19/2017 4.3  3.5 - 5.0 g/dL Final  . AST 09/19/2017 17  15 - 41 U/L Final  . ALT 09/19/2017 11* 14 - 54 U/L Final  . Alkaline Phosphatase 09/19/2017 67  38 - 126 U/L Final  . Total  Bilirubin 09/19/2017 0.5  0.3 - 1.2 mg/dL Final  . GFR calc non Af Amer 09/19/2017 52* >60 mL/min Final  . GFR calc Af Amer 09/19/2017 60* >60 mL/min Final   Comment: (NOTE) The eGFR has been calculated using the CKD EPI equation. This calculation has not been validated in all clinical situations. eGFR's persistently <60 mL/min signify possible Chronic Kidney Disease.   . Anion gap 09/19/2017 12  5 - 15 Final    RADIOGRAPHIC STUDIES: I have personally reviewed the radiological images as listed and agree with the findings in the report  No results found.  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 Will call patient with any abnormal labs. -Will set patient up for 2 doses of IV feraheme for iron replacement. -RTC in 2 months to assess response to feraheme. CBC, CMP, iron studies on next visit.  Orders Placed This Encounter  Procedures  . CBC with Differential    Standing Status:   Future    Standing Expiration Date:   11/25/2018  . Comprehensive metabolic panel    Standing Status:   Future    Standing Expiration Date:   11/25/2018  . Erythropoietin    Standing Status:   Future    Standing Expiration Date:   11/25/2018  . Iron and TIBC    Standing Status:   Future    Standing Expiration Date:   11/25/2018  . Vitamin B12    Standing Status:   Future    Standing Expiration Date:   11/25/2018  . Ferritin    Standing Status:   Future    Standing Expiration Date:   11/25/2018  . Folate    Standing Status:   Future    Standing Expiration Date:   11/25/2018  . Multiple Myeloma Panel (SPEP&IFE w/QIG)    Standing Status:   Future    Standing Expiration Date:   11/25/2018  . Kappa/lambda light chains    Standing Status:   Future    Standing Expiration Date:   11/25/2018  . Hemoglobinopathy evaluation    Standing Status:   Future    Standing Expiration Date:   11/25/2018  . Reticulocytes     Standing Status:   Future    Standing Expiration Date:   11/25/2018    All questions were answered. The patient knows to call the clinic with any problems, questions or concerns.  This note was electronically signed.    Twana First, MD  11/25/2017 1:15 PM

## 2017-11-26 ENCOUNTER — Other Ambulatory Visit (HOSPITAL_COMMUNITY): Payer: Self-pay | Admitting: Oncology

## 2017-11-26 LAB — ERYTHROPOIETIN: ERYTHROPOIETIN: 18.7 m[IU]/mL — AB (ref 2.6–18.5)

## 2017-11-29 ENCOUNTER — Encounter (HOSPITAL_COMMUNITY): Payer: Self-pay

## 2017-11-29 ENCOUNTER — Encounter (HOSPITAL_COMMUNITY): Payer: Medicare Other | Attending: Oncology

## 2017-11-29 ENCOUNTER — Other Ambulatory Visit: Payer: Self-pay

## 2017-11-29 VITALS — BP 122/64 | HR 70 | Temp 98.9°F | Resp 20

## 2017-11-29 DIAGNOSIS — D509 Iron deficiency anemia, unspecified: Secondary | ICD-10-CM

## 2017-11-29 DIAGNOSIS — D508 Other iron deficiency anemias: Secondary | ICD-10-CM | POA: Insufficient documentation

## 2017-11-29 LAB — MULTIPLE MYELOMA PANEL, SERUM
ALPHA2 GLOB SERPL ELPH-MCNC: 0.8 g/dL (ref 0.4–1.0)
Albumin SerPl Elph-Mcnc: 3.5 g/dL (ref 2.9–4.4)
Albumin/Glob SerPl: 0.9 (ref 0.7–1.7)
Alpha 1: 0.2 g/dL (ref 0.0–0.4)
B-Globulin SerPl Elph-Mcnc: 1.7 g/dL — ABNORMAL HIGH (ref 0.7–1.3)
Gamma Glob SerPl Elph-Mcnc: 1.3 g/dL (ref 0.4–1.8)
Globulin, Total: 4 g/dL — ABNORMAL HIGH (ref 2.2–3.9)
IGG (IMMUNOGLOBIN G), SERUM: 1202 mg/dL (ref 700–1600)
IGM (IMMUNOGLOBULIN M), SRM: 52 mg/dL (ref 26–217)
IgA: 897 mg/dL — ABNORMAL HIGH (ref 64–422)
M Protein SerPl Elph-Mcnc: 0.9 g/dL — ABNORMAL HIGH
TOTAL PROTEIN ELP: 7.5 g/dL (ref 6.0–8.5)

## 2017-11-29 LAB — HEMOGLOBINOPATHY EVALUATION
HGB F QUANT: 0 % (ref 0.0–2.0)
HGB S QUANTITAION: 0 %
Hgb A2 Quant: 2.1 % (ref 1.8–3.2)
Hgb A: 97.9 % (ref 96.4–98.8)
Hgb C: 0 %
Hgb Variant: 0 %

## 2017-11-29 LAB — KAPPA/LAMBDA LIGHT CHAINS
KAPPA, LAMDA LIGHT CHAIN RATIO: 123.43 — AB (ref 0.26–1.65)
Kappa free light chain: 2048.9 mg/L — ABNORMAL HIGH (ref 3.3–19.4)
Lambda free light chains: 16.6 mg/L (ref 5.7–26.3)

## 2017-11-29 MED ORDER — SODIUM CHLORIDE 0.9 % IV SOLN
INTRAVENOUS | Status: DC
  Administered 2017-11-29: 14:00:00 via INTRAVENOUS

## 2017-11-29 MED ORDER — SODIUM CHLORIDE 0.9 % IV SOLN
510.0000 mg | Freq: Once | INTRAVENOUS | Status: AC
  Administered 2017-11-29: 510 mg via INTRAVENOUS
  Filled 2017-11-29: qty 17

## 2017-11-29 MED ORDER — CYANOCOBALAMIN 1000 MCG/ML IJ SOLN
1000.0000 ug | INTRAMUSCULAR | Status: DC
  Administered 2017-11-29: 1000 ug via INTRAMUSCULAR
  Filled 2017-11-29: qty 1

## 2017-11-29 NOTE — Progress Notes (Signed)
Tolerated infusion w/o adverse reaction.  Alert, in no distress; VSS; discharged ambulatory in c/o family.

## 2017-12-06 ENCOUNTER — Ambulatory Visit (HOSPITAL_COMMUNITY): Payer: Medicare Other

## 2017-12-08 NOTE — Progress Notes (Signed)
[facility], [place] [address]   CLINIC:  Medical Oncology/Hematology  PCP:  Vesta Mixer [address] [place] [address] [phone]   REASON FOR VISIT:  Follow-up for Anemia  CURRENT THERAPY: Initial work-up    HISTORY OF PRESENT ILLNESS:  (From Dr. Laverle Patter note on 11/25/17)      INTERVAL HISTORY:  [name] 76 y.o. female returns for routine follow-up for anemia.   Here today with her sister.   Received her first dose of IV Feraheme and her first vitamin B12 injection on 11/29/17. Tolerated both medications well.  She states that she feels well overall; appetite 100% and energy levels 75%. States that she may have noticed a bit of improvement in her energy levels after her initial iron infusion, "but my energy levels have never been bad!"    Denies any frank bleeding episodes including blood in her stools, dark/tarry stools, hematuria, vaginal bleeding, nosebleeds, or gingival bleeding.  Denies any focal bone pain or arthralgias.    She is largely without complaints today.     REVIEW OF SYSTEMS:  Review of Systems  Constitutional: Negative.  Negative for chills, fatigue and fever.  HENT:  Negative.  Negative for nosebleeds.   Eyes: Negative.   Respiratory: Negative.  Negative for cough and shortness of breath.   Cardiovascular: Negative.  Negative for chest pain and leg swelling.  Gastrointestinal: Negative.  Negative for abdominal pain, blood in stool, constipation, diarrhea, nausea and vomiting.  Endocrine: Negative.   Genitourinary: Negative.  Negative for dysuria and hematuria.   Musculoskeletal: Negative.  Negative for arthralgias.  Skin: Negative.  Negative for rash.  Neurological: Negative.  Negative for dizziness and headaches.  Hematological: Negative.  Negative for adenopathy. Does not bruise/bleed easily.  Psychiatric/Behavioral: Negative.  Negative for depression and sleep disturbance. The  patient is not nervous/anxious.      PAST MEDICAL/SURGICAL HISTORY:  Past Medical History:  Diagnosis Date  . Breast cancer ([place])    left breast/ 2008/ surg/ rad tx  . Coronary artery disease   . Diabetes mellitus    Past Surgical History:  Procedure Laterality Date  . ABDOMINAL HYSTERECTOMY    . BREAST SURGERY       SOCIAL HISTORY:  Social History   Socioeconomic History  . Marital status: Divorced    Spouse name: Not on file  . Number of children: Not on file  . Years of education: Not on file  . Highest education level: Not on file  Social Needs  . Financial resource strain: Not on file  . Food insecurity - worry: Not on file  . Food insecurity - inability: Not on file  . Transportation needs - medical: Not on file  . Transportation needs - non-medical: Not on file  Occupational History  . Not on file  Tobacco Use  . Smoking status: Never Smoker  . Smokeless tobacco: Never Used  Substance and Sexual Activity  . Alcohol use: No  . Drug use: No  . Sexual activity: Yes    Birth control/protection: Surgical  Other Topics Concern  . Not on file  Social History Narrative  . Not on file    FAMILY HISTORY:  History reviewed. No pertinent family history.  CURRENT MEDICATIONS:  Outpatient Encounter Medications as of 12/09/2017  Medication Sig  . acetaminophen (TYLENOL) 500 MG tablet Take 500 mg by mouth every 6 (six) hours as needed for mild pain or moderate pain.  Marland Kitchen  aspirin 81 MG tablet Take 81 mg by mouth daily.    Marland Kitchen lisinopril-hydrochlorothiazide (PRINZIDE,ZESTORETIC) 10-12.5 MG per tablet Take 1 tablet by mouth daily.    . metFORMIN (GLUCOPHAGE) 1000 MG tablet Take 1,000 mg by mouth 2 times daily at 12 noon and 4 pm.     No facility-administered encounter medications on file as of 12/09/2017.     ALLERGIES:  Allergies  Allergen Reactions  . Motrin [Ibuprofen] Rash     PHYSICAL EXAM:  ECOG Performance status: 0 - Asymptomatic   Vitals:   12/09/17  1327  BP: 125/63  Pulse: 79  Resp: 16  Temp: 99 F (37.2 C)  SpO2: 99%   Filed Weights   12/09/17 1327  Weight: 179 lb (81.2 kg)    Physical Exam  Constitutional: She is oriented to person, place, and time and well-developed, well-nourished, and in no distress.  HENT:  Head: Normocephalic.  Mouth/Throat: Oropharynx is clear and moist. No oropharyngeal exudate.  Eyes: Conjunctivae are normal. Pupils are equal, round, and reactive to light. No scleral icterus.  Neck: Normal range of motion. Neck supple.  Cardiovascular: Normal rate and regular rhythm.  Pulmonary/Chest: Effort normal and breath sounds normal. No respiratory distress.  Abdominal: Soft. Bowel sounds are normal. There is no tenderness.  Musculoskeletal: Normal range of motion. She exhibits no edema.  Lymphadenopathy:    She has no cervical adenopathy.       Right: No supraclavicular adenopathy present.       Left: No supraclavicular adenopathy present.  Neurological: She is alert and oriented to person, place, and time. No cranial nerve deficit. Gait normal.  Skin: Skin is warm and dry. No rash noted.  Psychiatric: Mood, memory, affect and judgment normal.  Nursing note and vitals reviewed.    LABORATORY DATA:  I have reviewed the labs as listed.  CBC    Component Value Date/Time   WBC 5.5 11/25/2017 1356   RBC 2.90 (L) 11/25/2017 1356   RBC 2.90 (L) 11/25/2017 1356   HGB 9.4 (L) 11/25/2017 1356   HCT 29.2 (L) 11/25/2017 1356   PLT 269 11/25/2017 1356   MCV 100.7 (H) 11/25/2017 1356   MCH 32.4 11/25/2017 1356   MCHC 32.2 11/25/2017 1356   RDW 14.5 11/25/2017 1356   LYMPHSABS 1.9 11/25/2017 1356   MONOABS 0.3 11/25/2017 1356   EOSABS 0.1 11/25/2017 1356   BASOSABS 0.0 11/25/2017 1356   CMP Latest Ref Rng & Units 11/25/2017 09/19/2017 09/11/2011  Glucose 65 - 99 mg/dL 61(L) 94 207(H)  BUN 6 - 20 mg/dL 22(H) 18 20  Creatinine 0.44 - 1.00 mg/dL 1.09(H) 1.02(H) 0.92  Sodium 135 - 145 mmol/L 141 139 136    Potassium 3.5 - 5.1 mmol/L 3.4(L) 3.9 3.6  Chloride 101 - 111 mmol/L 106 102 101  CO2 22 - 32 mmol/L _0 Calcium 8.9 - 10.3 mg/dL 10.5(H) 10.3 9.8  Total Protein 6.5 - 8.1 g/dL 8.5(H) 8.7(H) 7.6  Total Bilirubin 0.3 - 1.2 mg/dL 0.6 0.5 0.3  Alkaline Phos 38 - 126 U/L 69 67 70  AST 15 - 41 U/L 14(L) 17 11  ALT 14 - 54 U/L 7(L) 11(L) 6   Results for JUDE, LINCK (MRN [id number])  Ref. Range 11/25/2017 13:56  Iron Latest Ref Range: 28 - 170 ug/dL 42  UIBC Latest Units: ug/dL 141  TIBC Latest Ref Range: 250 - 450 ug/dL 183 (L)  Saturation Ratios Latest Ref Range: 10.4 - 31.8 % 23  Ferritin Latest Ref Range: 11 - 307 ng/mL 26  Results for JAMILYNN, WHITACRE (MRN [id number])   Ref. Range 11/25/2017 13:56  Vitamin B12 Latest Ref Range: 180 - 914 pg/mL 164 (L)  Results for COURTLYN, AKI (MRN [id number])   Ref. Range 11/25/2017 13:57  Folate Latest Ref Range: >5.9 ng/mL 16.2   Results for TERESE, HEIER (MRN [id number])   Ref. Range 11/25/2017 13:57  Total Protein ELP Latest Ref Range: 6.0 - 8.5 g/dL 7.5  Albumin SerPl Elph-Mcnc Latest Ref Range: 2.9 - 4.4 g/dL 3.5  Albumin/Glob SerPl Latest Ref Range: 0.7 - 1.7  0.9  Alpha2 Glob SerPl Elph-Mcnc Latest Ref Range: 0.4 - 1.0 g/dL 0.8  Alpha 1 Latest Ref Range: 0.0 - 0.4 g/dL 0.2  Gamma Glob SerPl Elph-Mcnc Latest Ref Range: 0.4 - 1.8 g/dL 1.3  IFE 1 Unknown Comment  Globulin, Total Latest Ref Range: 2.2 - 3.9 g/dL 4.0 (H)  B-Globulin SerPl Elph-Mcnc Latest Ref Range: 0.7 - 1.3 g/dL 1.7 (H)  IgG (Immunoglobin G), Serum Latest Ref Range: 700 - 1,600 mg/dL 1,202  IgA Latest Ref Range: 64 - 422 mg/dL 897 (H)  IgM (Immunoglobulin M), Srm Latest Ref Range: 26 - 217 mg/dL 52   Results for OCEANNA, ARRUDA (MRN [id number])   Ref. Range 11/25/2017 13:56  Kappa free light chain Latest Ref Range: 3.3 - 19.4 mg/L 2,048.9 (H)  Lamda free light chains Latest Ref Range: 5.7 - 26.3 mg/L 16.6  Kappa, lamda light chain ratio  Latest Ref Range: 0.26 - 1.65  123.43 (H)  Results for RESHONDA, KOERBER (MRN [id number])  Ref. Range 11/25/2017 13:57  M Protein SerPl Elph-Mcnc Latest Ref Range: Not Observed g/dL 0.9 (H)  Results for FATE, GALANTI (MRN [id number])   Ref. Range 11/25/2017 13:56  RBC. Latest Ref Range: 3.87 - 5.11 MIL/uL 2.90 (L)  Retic Ct Pct Latest Ref Range: 0.4 - 3.1 % 1.3  Retic Count, Absolute Latest Ref Range: 19.0 - 186.0 K/uL 37.7  Hgb A Latest Ref Range: 96.4 - 98.8 % 97.9  Hgb A2 Quant Latest Ref Range: 1.8 - 3.2 % 2.1  Hgb F Quant Latest Ref Range: 0.0 - 2.0 % 0.0  Hgb S Quant Latest Ref Range: 0.0 % 0.0  HGB C Latest Ref Range: 0.0 % 0.0  HGB VARIANT Latest Ref Range: 0.0 % 0.0  Please Note: Unknown Comment    Results for AHLEY, BULLS (MRN [id number])   Ref. Range 11/25/2017 13:56  Erythropoietin Latest Ref Range: 2.6 - 18.5 mIU/mL 18.7 (H)   PENDING LABS:    DIAGNOSTIC IMAGING:    PATHOLOGY:       ASSESSMENT & PLAN:   Anemia:  -Labs reviewed with patient in detail.  There are likely several elements contributing to her anemia including iron deficiency, vitamin B12 deficiency, & CKD. Also noted to have IgA monoclonal protein on SPEP/IFE (see below).  -Received 1 dose of IV Feraheme and first vitamin B12 injection on 11/29/17. She tolerated both medications without adverse event. Due for her 2nd dose of IV Feraheme today; this will be administered and she will be closely monitored by nursing during this infusion.  -Continue monthly vitamin B12 injections.  -Could consider starting ESA therapy as well; EPO level 18.7.  Will hold off until bone marrow biopsy is completed and results reviewed.  -Return to cancer center ~2 weeks after bone marrow biopsy to review results with MD.   Concern for multiple myeloma:  -Discussed with  Dr. Talbert Cage. 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  -"CRAB" signs/symptoms reviewed: Calcium elevated at 10.5. Mild renal disease appreciated with BUN/CRE 22/1.09 with GFR 55.  Hgb 9.4 g/dL.  Denies focal bony pain.  -Discussed the nature and diagnosis of multiple myeloma with patient in very simplistic terms. Discussed further work-up for multiple myeloma, which includes bone marrow biopsy. We discussed the purpose, procedure, and possible side effects of bone marrow biopsy/aspiration.  She agrees to proceed. Orders placed for bone marrow biopsy to be completed with image-guidance and sedation at [facility] in [place].  Instructed her to ensure she has someone to drive her to this procedure since she will receive sedation; she shares with me that she does not drive and her sister confirms that someone will be able to take the patient to [facility] for procedure.   -Will have her return for follow-up visit with MD ~2 weeks after bone marrow biopsy is completed to review results and discuss further plan of care.  Patient agrees with this plan.      Dispo:  -Bone marrow biopsy/aspiration sometime within the next couple of weeks at [facility] in [place].  -Return to cancer center for follow-up with MD ~2 weeks after bone marrow biopsy to review results and discuss further treatment plan.      All questions were answered to patient's stated satisfaction. Encouraged patient to call with any new concerns or questions before her next visit to the cancer center and we can certain see her sooner, if needed.    Plan of care discussed with Dr. Talbert Cage, who agrees with the above aforementioned.    Orders placed this encounter:  Orders Placed This Encounter  Procedures  . Biopsy bone marrow  . CT BIOPSY      Mike Craze, [place] [phone]

## 2017-12-09 ENCOUNTER — Other Ambulatory Visit: Payer: Self-pay

## 2017-12-09 ENCOUNTER — Encounter (HOSPITAL_BASED_OUTPATIENT_CLINIC_OR_DEPARTMENT_OTHER): Payer: Medicare Other | Admitting: Adult Health

## 2017-12-09 ENCOUNTER — Other Ambulatory Visit (HOSPITAL_COMMUNITY): Payer: Self-pay | Admitting: Adult Health

## 2017-12-09 ENCOUNTER — Encounter (HOSPITAL_COMMUNITY): Payer: Self-pay | Admitting: Adult Health

## 2017-12-09 ENCOUNTER — Encounter (HOSPITAL_BASED_OUTPATIENT_CLINIC_OR_DEPARTMENT_OTHER): Payer: Medicare Other

## 2017-12-09 VITALS — BP 124/62 | HR 81 | Temp 98.7°F | Resp 18

## 2017-12-09 VITALS — BP 125/63 | HR 79 | Temp 99.0°F | Resp 16 | Ht 62.0 in | Wt 179.0 lb

## 2017-12-09 DIAGNOSIS — D472 Monoclonal gammopathy: Secondary | ICD-10-CM

## 2017-12-09 DIAGNOSIS — D508 Other iron deficiency anemias: Secondary | ICD-10-CM

## 2017-12-09 DIAGNOSIS — D649 Anemia, unspecified: Secondary | ICD-10-CM

## 2017-12-09 DIAGNOSIS — E538 Deficiency of other specified B group vitamins: Secondary | ICD-10-CM

## 2017-12-09 MED ORDER — SODIUM CHLORIDE 0.9 % IV SOLN
510.0000 mg | Freq: Once | INTRAVENOUS | Status: AC
  Administered 2017-12-09: 510 mg via INTRAVENOUS
  Filled 2017-12-09: qty 17

## 2017-12-09 MED ORDER — SODIUM CHLORIDE 0.9 % IV SOLN
INTRAVENOUS | Status: DC
  Administered 2017-12-09: 14:00:00 via INTRAVENOUS

## 2017-12-09 NOTE — Patient Instructions (Signed)
[facility] at [facility] Discharge Instructions  RECOMMENDATIONS MADE BY THE CONSULTANT AND ANY TEST RESULTS WILL BE SENT TO YOUR REFERRING PHYSICIAN.  You were seen today by Mike Craze NP. Bone Marrow Biopsy to be scheduled. Continue monthly B12 injection. Return in about 2 weeks for follow up and results.   Thank you for choosing [facility] to provide your oncology and hematology care.  To afford each patient quality time with our provider, please arrive at least 15 minutes before your scheduled appointment time.    If you have a lab appointment with the [facility] please come in thru the  Main Entrance and check in at the main information desk  You need to re-schedule your appointment should you arrive 10 or more minutes late.  We strive to give you quality time with our providers, and arriving late affects you and other patients whose appointments are after yours.  Also, if you no show three or more times for appointments you may be dismissed from the clinic at the providers discretion.     Again, thank you for choosing [facility].  Our hope is that these requests will decrease the amount of time that you wait before being seen by our physicians.       _____________________________________________________________  Should you have questions after your visit to [facility], please contact our office at [phone] between the hours of 8:30 a.m. and 4:30 p.m.  Voicemails left after 4:30 p.m. will not be returned until the following business day.  For prescription refill requests, have your pharmacy contact our office.       Resources For Cancer Patients and their Caregivers ? American Cancer Society: Can assist with transportation, wigs, general needs, runs Look Good Feel Better.        470-381-3806 ? Cancer Care: Provides financial assistance, online support groups, medication/co-pay  assistance.  1-800-813-HOPE 671-476-9199) ? [facility] Assists [place] cancer patients and their families through emotional , educational and financial support.  (331)062-3417 ? [place] DSS Where to apply for food stamps, Medicaid and utility assistance. (703)555-4847 ? [facility]: Transportation to medical appointments. 234 607 4784 ? Social Security Administration: May apply for disability if have a Stage IV cancer. 225-207-2674 (920) 743-2079 ? [employer], Disability and Transit Services: Assists with nutrition, care and transit needs. [facility] Support Programs: _0 @ > Cancer Support Group  2nd Tuesday of the month 1pm-2pm, Journey Room  > Creative Journey  3rd Tuesday of the month 1130am-1pm, Journey Room  > Look Good Feel Better  1st Wednesday of the month 10am-12 noon, Journey Room (Call [facility] to register [phone])

## 2017-12-09 NOTE — Patient Instructions (Signed)
[facility] at [facility] Discharge Instructions  RECOMMENDATIONS MADE BY THE CONSULTANT AND ANY TEST RESULTS WILL BE SENT TO YOUR REFERRING PHYSICIAN.  Received Feraheme infusion today.Follow-up as scheduled. Call clinic for any questions or concerns  Thank you for choosing  Cancer Center at [facility] to provide your oncology and hematology care.  To afford each patient quality time with our provider, please arrive at least 15 minutes before your scheduled appointment time.    If you have a lab appointment with the Cancer Center please come in thru the  Main Entrance and check in at the main information desk  You need to re-schedule your appointment should you arrive 10 or more minutes late.  We strive to give you quality time with our providers, and arriving late affects you and other patients whose appointments are after yours.  Also, if you no show three or more times for appointments you may be dismissed from the clinic at the providers discretion.     Again, thank you for choosing [facility].  Our hope is that these requests will decrease the amount of time that you wait before being seen by our physicians.       _____________________________________________________________  Should you have questions after your visit to [facility], please contact our office at [phone] between the hours of 8:30 a.m. and 4:30 p.m.  Voicemails left after 4:30 p.m. will not be returned until the following business day.  For prescription refill requests, have your pharmacy contact our office.       Resources For Cancer Patients and their Caregivers ? American Cancer Society: Can assist with transportation, wigs, general needs, runs Look Good Feel Better.        1-888-227-6333 ? Cancer Care: Provides financial assistance, online support groups, medication/co-pay assistance.  1-800-813-HOPE (4673) ? [facility] Assists [place] cancer patients and their families through emotional , educational and financial support.  336-427-4357 ? [place] DSS Where to apply for food stamps, Medicaid and utility assistance. 336-342-1394 ? RCATS: Transportation to medical appointments. 336-347-2287 ? Social Security Administration: May apply for disability if have a Stage IV cancer. 336-342-7796 1-800-772-1213 ? [place] Aging, Disability and Transit Services: Assists with nutrition, care and transit needs. 336-349-2343  Cancer Center Support Programs: @[email or web address]@ > Cancer Support Group  2nd Tuesday of the month 1pm-2pm, Journey Room  > Creative Journey  3rd Tuesday of the month 1130am-1pm, Journey Room  > Look Good Feel Better  1st Wednesday of the month 10am-12 noon, Journey Room (Call American Cancer Society to register 1-800-395-5775)   

## 2017-12-09 NOTE — Progress Notes (Signed)
Shanitra Phillippi Dacosta tolerated Feraheme infusion well without complaints or incident. VSS upon discharge. Pt discharged self ambulatory in satisfactory condition accompanied by family member

## 2017-12-31 ENCOUNTER — Encounter (HOSPITAL_COMMUNITY): Payer: Self-pay

## 2017-12-31 ENCOUNTER — Inpatient Hospital Stay (HOSPITAL_COMMUNITY): Payer: Medicare Other | Attending: Oncology

## 2017-12-31 ENCOUNTER — Other Ambulatory Visit: Payer: Self-pay | Admitting: Radiology

## 2017-12-31 VITALS — BP 120/56 | HR 74 | Temp 98.9°F | Resp 20

## 2017-12-31 DIAGNOSIS — D649 Anemia, unspecified: Secondary | ICD-10-CM

## 2017-12-31 DIAGNOSIS — Z5112 Encounter for antineoplastic immunotherapy: Secondary | ICD-10-CM | POA: Insufficient documentation

## 2017-12-31 DIAGNOSIS — D508 Other iron deficiency anemias: Secondary | ICD-10-CM

## 2017-12-31 DIAGNOSIS — C9000 Multiple myeloma not having achieved remission: Secondary | ICD-10-CM | POA: Insufficient documentation

## 2017-12-31 DIAGNOSIS — E119 Type 2 diabetes mellitus without complications: Secondary | ICD-10-CM | POA: Insufficient documentation

## 2017-12-31 DIAGNOSIS — E538 Deficiency of other specified B group vitamins: Secondary | ICD-10-CM | POA: Insufficient documentation

## 2017-12-31 MED ORDER — CYANOCOBALAMIN 1000 MCG/ML IJ SOLN
INTRAMUSCULAR | Status: AC
  Filled 2017-12-31: qty 1

## 2017-12-31 MED ORDER — CYANOCOBALAMIN 1000 MCG/ML IJ SOLN
1000.0000 ug | INTRAMUSCULAR | Status: DC
  Administered 2017-12-31: 1000 ug via INTRAMUSCULAR

## 2017-12-31 NOTE — Patient Instructions (Signed)
[facility] at [facility] Discharge Instructions  RECOMMENDATIONS MADE BY THE CONSULTANT AND ANY TEST RESULTS WILL BE SENT TO YOUR REFERRING PHYSICIAN.  Received Vit B12 injection today. Follow-up as scheduled. Call clinic for any questions or concerns  Thank you for choosing [facility] at [facility] to provide your oncology and hematology care.  To afford each patient quality time with our provider, please arrive at least 15 minutes before your scheduled appointment time.    If you have a lab appointment with the Cancer Center please come in thru the  Main Entrance and check in at the main information desk  You need to re-schedule your appointment should you arrive 10 or more minutes late.  We strive to give you quality time with our providers, and arriving late affects you and other patients whose appointments are after yours.  Also, if you no show three or more times for appointments you may be dismissed from the clinic at the providers discretion.     Again, thank you for choosing  Cancer Center.  Our hope is that these requests will decrease the amount of time that you wait before being seen by our physicians.       _____________________________________________________________  Should you have questions after your visit to  Cancer Center, please contact our office at [phone] between the hours of 8:30 a.m. and 4:30 p.m.  Voicemails left after 4:30 p.m. will not be returned until the following business day.  For prescription refill requests, have your pharmacy contact our office.       Resources For Cancer Patients and their Caregivers ? American Cancer Society: Can assist with transportation, wigs, general needs, runs Look Good Feel Better.        1-888-227-6333 ? Cancer Care: Provides financial assistance, online support groups, medication/co-pay assistance.  1-800-813-HOPE (4673) ? [facility] Assists [place] cancer patients and their families through emotional , educational and financial support.  336-427-4357 ? [place] DSS Where to apply for food stamps, Medicaid and utility assistance. 336-342-1394 ? RCATS: Transportation to medical appointments. 336-347-2287 ? Social Security Administration: May apply for disability if have a Stage IV cancer. 336-342-7796 1-800-772-1213 ? [place] Aging, Disability and Transit Services: Assists with nutrition, care and transit needs. 336-349-2343  Cancer Center Support Programs: @[email or web address]@ > Cancer Support Group  2nd Tuesday of the month 1pm-2pm, Journey Room  > Creative Journey  3rd Tuesday of the month 1130am-1pm, Journey Room  > Look Good Feel Better  1st Wednesday of the month 10am-12 noon, Journey Room (Call American Cancer Society to register 1-800-395-5775)   

## 2017-12-31 NOTE — Progress Notes (Signed)
Brittany Archer tolerated Vit B12 injection well without complaints or incident. VSS Pt discharged self ambulatory in satisfactory condition accompanied by family member

## 2018-01-03 ENCOUNTER — Ambulatory Visit (HOSPITAL_COMMUNITY)
Admission: RE | Admit: 2018-01-03 | Discharge: 2018-01-03 | Disposition: A | Discharge status: Home / Self Care | Payer: Medicare Other | Source: Ambulatory Visit | Attending: Adult Health | Admitting: Adult Health

## 2018-01-03 ENCOUNTER — Encounter (HOSPITAL_COMMUNITY): Payer: Self-pay

## 2018-01-03 DIAGNOSIS — I2510 Atherosclerotic heart disease of native coronary artery without angina pectoris: Secondary | ICD-10-CM | POA: Insufficient documentation

## 2018-01-03 DIAGNOSIS — C9000 Multiple myeloma not having achieved remission: Secondary | ICD-10-CM | POA: Diagnosis not present

## 2018-01-03 DIAGNOSIS — D649 Anemia, unspecified: Secondary | ICD-10-CM | POA: Insufficient documentation

## 2018-01-03 DIAGNOSIS — D472 Monoclonal gammopathy: Secondary | ICD-10-CM | POA: Diagnosis present

## 2018-01-03 DIAGNOSIS — Z853 Personal history of malignant neoplasm of breast: Secondary | ICD-10-CM | POA: Insufficient documentation

## 2018-01-03 DIAGNOSIS — D539 Nutritional anemia, unspecified: Secondary | ICD-10-CM | POA: Insufficient documentation

## 2018-01-03 DIAGNOSIS — E119 Type 2 diabetes mellitus without complications: Secondary | ICD-10-CM | POA: Diagnosis not present

## 2018-01-03 DIAGNOSIS — Z7984 Long term (current) use of oral hypoglycemic drugs: Secondary | ICD-10-CM | POA: Diagnosis not present

## 2018-01-03 DIAGNOSIS — Z7982 Long term (current) use of aspirin: Secondary | ICD-10-CM | POA: Diagnosis not present

## 2018-01-03 LAB — BASIC METABOLIC PANEL
ANION GAP: 9 (ref 5–15)
BUN: 21 mg/dL — ABNORMAL HIGH (ref 6–20)
CHLORIDE: 105 mmol/L (ref 101–111)
CO2: 27 mmol/L (ref 22–32)
Calcium: 10.2 mg/dL (ref 8.9–10.3)
Creatinine, Ser: 0.93 mg/dL (ref 0.44–1.00)
GFR calc non Af Amer: 58 mL/min — ABNORMAL LOW (ref >60)
Glucose, Bld: 98 mg/dL (ref 65–99)
Potassium: 3.6 mmol/L (ref 3.5–5.1)
SODIUM: 141 mmol/L (ref 135–145)

## 2018-01-03 LAB — CBC WITH DIFFERENTIAL/PLATELET
BASOS PCT: 0 %
Basophils Absolute: 0 10*3/uL (ref 0.0–0.1)
Eosinophils Absolute: 0.1 10*3/uL (ref 0.0–0.7)
Eosinophils Relative: 2 %
HCT: 30.3 % — ABNORMAL LOW (ref 36.0–46.0)
HEMOGLOBIN: 9.9 g/dL — AB (ref 12.0–15.0)
LYMPHS ABS: 1.7 10*3/uL (ref 0.7–4.0)
Lymphocytes Relative: 26 %
MCH: 32.9 pg (ref 26.0–34.0)
MCHC: 32.7 g/dL (ref 30.0–36.0)
MCV: 100.7 fL — ABNORMAL HIGH (ref 78.0–100.0)
MONOS PCT: 6 %
Monocytes Absolute: 0.4 10*3/uL (ref 0.1–1.0)
NEUTROS ABS: 4.3 10*3/uL (ref 1.7–7.7)
NEUTROS PCT: 66 %
Platelets: 207 10*3/uL (ref 150–400)
RBC: 3.01 MIL/uL — AB (ref 3.87–5.11)
RDW: 14.8 % (ref 11.5–15.5)
WBC: 6.5 10*3/uL (ref 4.0–10.5)

## 2018-01-03 LAB — PROTIME-INR
INR: 0.89
PROTHROMBIN TIME: 12 s (ref 11.4–15.2)

## 2018-01-03 LAB — GLUCOSE, CAPILLARY: Glucose-Capillary: 97 mg/dL (ref 65–99)

## 2018-01-03 MED ORDER — FENTANYL CITRATE (PF) 100 MCG/2ML IJ SOLN
INTRAMUSCULAR | Status: AC | PRN
  Administered 2018-01-03 (×2): 50 ug via INTRAVENOUS

## 2018-01-03 MED ORDER — LIDOCAINE-EPINEPHRINE 1 %-1:100000 IJ SOLN
INTRAMUSCULAR | Status: AC | PRN
  Administered 2018-01-03: 10 mL via INTRADERMAL

## 2018-01-03 MED ORDER — SODIUM CHLORIDE 0.9 % IV SOLN
INTRAVENOUS | Status: DC
  Administered 2018-01-03: 09:00:00 via INTRAVENOUS

## 2018-01-03 MED ORDER — FLUMAZENIL 0.5 MG/5ML IV SOLN
INTRAVENOUS | Status: AC
  Filled 2018-01-03: qty 5

## 2018-01-03 MED ORDER — FENTANYL CITRATE (PF) 100 MCG/2ML IJ SOLN
INTRAMUSCULAR | Status: AC
  Filled 2018-01-03: qty 4

## 2018-01-03 MED ORDER — MIDAZOLAM HCL 2 MG/2ML IJ SOLN
INTRAMUSCULAR | Status: AC
  Filled 2018-01-03: qty 6

## 2018-01-03 MED ORDER — MIDAZOLAM HCL 2 MG/2ML IJ SOLN
INTRAMUSCULAR | Status: AC | PRN
  Administered 2018-01-03 (×2): 1 mg via INTRAVENOUS

## 2018-01-03 MED ORDER — NALOXONE HCL 0.4 MG/ML IJ SOLN
INTRAMUSCULAR | Status: AC
  Filled 2018-01-03: qty 1

## 2018-01-03 NOTE — Consult Note (Signed)
Chief Complaint: Patient was seen in consultation today for CT guided bone marrow biopsy  Referring Physician(s): Dawson,Gretchen W  Supervising Physician: Sandi Mariscal  Patient Status: [facility] - Out-pt  History of Present Illness: Brittany Archer is a 77 y.o. 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  She presents today for CT-guided bone marrow biopsy to rule out multiple myeloma  Past Medical History:  Diagnosis Date  . Breast cancer ([place])    left breast/ 2008/ surg/ rad tx  . Coronary artery disease   . Diabetes mellitus     Past Surgical History:  Procedure Laterality Date  . ABDOMINAL HYSTERECTOMY    . BREAST SURGERY      Allergies: Motrin [ibuprofen]  Medications: Prior to Admission medications   Medication Sig Start Date End Date Taking? Authorizing Provider  acetaminophen (TYLENOL) 500 MG tablet Take 500 mg by mouth every 6 (six) hours as needed for mild pain or moderate pain.    [provider]  aspirin 81 MG tablet Take 81 mg by mouth daily.      [provider]  lisinopril-hydrochlorothiazide (PRINZIDE,ZESTORETIC) 10-12.5 MG per tablet Take 1 tablet by mouth daily.      [provider]  metFORMIN (GLUCOPHAGE) 1000 MG tablet Take 1,000 mg by mouth 2 times daily at 12 noon and 4 pm.      [provider]     No family history on file.  Social History   Socioeconomic History  . Marital status: Divorced    Spouse name: Not on file  . Number of children: Not on file  . Years of education: Not on file  . Highest education level: Not on file  Social Needs  . Financial resource strain: Not on file  . Food insecurity - worry: Not on file  . Food insecurity - inability: Not on file  . Transportation needs - medical: Not on file  . Transportation needs - non-medical: Not on file  Occupational History  .  Not on file  Tobacco Use  . Smoking status: Never Smoker  . Smokeless tobacco: Never Used  Substance and Sexual Activity  . Alcohol use: No  . Drug use: No  . Sexual activity: Yes    Birth control/protection: Surgical  Other Topics Concern  . Not on file  Social History Narrative  . Not on file      Review of Systems currently denies fever, headache, chest pain, dyspnea, cough, abdominal/back pain, nausea, vomiting or bleeding.  Vital Signs: Blood pressure 148/76, heart rate 64, respirations 20, O2 sat 100% room air, temp 98.1   Physical Exam awake, alert.  Chest clear to auscultation bilaterally.  Heart with regular rate and rhythm.  Abdomen soft, positive bowel sounds, nontender.  Trace bilateral lower extremity edema.  Imaging: No results found.  Labs:  CBC: Recent Labs    09/19/17 1554 11/25/17 1356  WBC 10.8* 5.5  HGB 10.7* 9.4*  HCT 32.6* 29.2*  PLT 275 269    COAGS: No results for input(s): INR, APTT in the last 8760 hours.  BMP: Recent Labs    09/19/17 1554 11/25/17 1356  NA 139 141  K 3.9 3.4*  CL 102 106  CO2 25 26  GLUCOSE 94 61*  BUN 18 22*  CALCIUM 10.3 10.5*  CREATININE 1.02* 1.09*  GFRNONAA 52* 48*  GFRAA 60* 55*    LIVER FUNCTION TESTS: Recent  Labs    09/19/17 1554 11/25/17 1356  BILITOT 0.5 0.6  AST 17 14*  ALT 11* 7*  ALKPHOS 67 69  PROT 8.7* 8.5*  ALBUMIN 4.3 4.2    TUMOR MARKERS: No results for input(s): AFPTM, CEA, CA199, CHROMGRNA in the last 8760 hours.  Assessment and Plan: 77 y.o. 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  She presents today for CT-guided bone marrow biopsy to rule out multiple myeloma.Risks and benefits discussed with the patient/sister including, but not limited to bleeding, infection, damage to adjacent structures or low yield requiring additional tests.All of the patient's  questions were answered, patient is agreeable to proceed. Consent signed and in chart.       Thank you for this interesting consult.  I greatly enjoyed meeting Brittany Archer and look forward to participating in their care.  A copy of this report was sent to the requesting provider on this date.  Electronically Signed: D. Rowe Robert, PA-C 01/03/2018, 8:57 AM   I spent a total of  20 minutes   in face to face in clinical consultation, greater than 50% of which was counseling/coordinating care for CT guided bone marrow biopsy

## 2018-01-03 NOTE — Sedation Documentation (Signed)
Patient denies pain and is resting comfortably.  

## 2018-01-03 NOTE — Discharge Instructions (Signed)
Moderate Conscious Sedation, Adult, Care After These instructions provide you with information about caring for yourself after your procedure. Your health care provider may also give you more specific instructions. Your treatment has been planned according to current medical practices, but problems sometimes occur. Call your health care provider if you have any problems or questions after your procedure. What can I expect after the procedure? After your procedure, it is common:  To feel sleepy for several hours.  To feel clumsy and have poor balance for several hours.  To have poor judgment for several hours.  To vomit if you eat too soon.  Follow these instructions at home: For at least 24 hours after the procedure:   Do not: ? Participate in activities where you could fall or become injured. ? Drive. ? Use heavy machinery. ? Drink alcohol. ? Take sleeping pills or medicines that cause drowsiness. ? Make important decisions or sign legal documents. ? Take care of children on your own.  Rest. Eating and drinking  Follow the diet recommended by your health care provider.  If you vomit: ? Drink water, juice, or soup when you can drink without vomiting. ? Make sure you have little or no nausea before eating solid foods. General instructions  Have a responsible adult stay with you until you are awake and alert.  Take over-the-counter and prescription medicines only as told by your health care provider.  If you smoke, do not smoke without supervision.  Keep all follow-up visits as told by your health care provider. This is important. Contact a health care provider if:  You keep feeling nauseous or you keep vomiting.  You feel light-headed.  You develop a rash.  You have a fever. Get help right away if:  You have trouble breathing. This information is not intended to replace advice given to you by your health care provider. Make sure you discuss any questions you have  with your health care provider. Document Released: 10/04/2013 Document Revised: 05/18/2016 Document Reviewed: 04/04/2016 Elsevier Interactive Patient Education  2018 [place].   Bone Marrow Aspiration and Bone Marrow Biopsy, Adult, Care After This sheet gives you information about how to care for yourself after your procedure. Your health care provider may also give you more specific instructions. If you have problems or questions, contact your health care provider. What can I expect after the procedure? After the procedure, it is common to have:  Mild pain and tenderness.  Swelling.  Bruising.  Follow these instructions at home:  Take over-the-counter or prescription medicines only as told by your health care provider.  Do not take baths, swim, or use a hot tub until your health care provider approves. Ask if you can take a shower or have a sponge bath.  You may shower tomorrow.  Follow instructions from your health care provider about how to take care of the puncture site. Make sure you: ? Wash your hands with soap and water before you change your bandage (dressing). If soap and water are not available, use hand sanitizer. ? Change your dressing as told by your health care provider.  You may remove your dressing tomorrow.  Check your puncture siteevery day for signs of infection. Check for: ? More redness, swelling, or pain. ? More fluid or blood. ? Warmth. ? Pus or a bad smell.  Return to your normal activities as told by your health care provider. Ask your health care provider what activities are safe for you.  Do not drive  for 24 hours if you were given a medicine to help you relax (sedative). °· Keep all follow-up visits as told by your health care provider. This is important. °Contact a health care provider if: °· You have more redness, swelling, or pain around the puncture site. °· You have more fluid or blood coming from the puncture site. °· Your puncture site feels  warm to the touch. °· You have pus or a bad smell coming from the puncture site. °· You have a fever. °· Your pain is not controlled with medicine. °This information is not intended to replace advice given to you by your health care provider. Make sure you discuss any questions you have with your health care provider. °Document Released: 07/03/2005 Document Revised: 07/03/2016 Document Reviewed: 05/27/2016 °Elsevier Interactive Patient Education © 2018 Elsevier Inc. ° °

## 2018-01-03 NOTE — Procedures (Signed)
Pre-procedure Diagnosis: Concern for multiple myeloma Post-procedure Diagnosis: Same  Technically successful CT guided bone marrow aspiration and biopsy of left iliac crest.   Complications: None Immediate  EBL: None  SignedSandi Mariscal Pager: [phone] 01/03/2018, 11:03 AM

## 2018-01-20 ENCOUNTER — Inpatient Hospital Stay (HOSPITAL_BASED_OUTPATIENT_CLINIC_OR_DEPARTMENT_OTHER): Payer: Medicare Other | Admitting: Internal Medicine

## 2018-01-20 ENCOUNTER — Other Ambulatory Visit: Payer: Self-pay

## 2018-01-20 ENCOUNTER — Encounter (HOSPITAL_COMMUNITY): Payer: Self-pay

## 2018-01-20 ENCOUNTER — Encounter (HOSPITAL_COMMUNITY): Payer: Self-pay | Admitting: Internal Medicine

## 2018-01-20 ENCOUNTER — Inpatient Hospital Stay (HOSPITAL_COMMUNITY): Payer: Medicare Other

## 2018-01-20 DIAGNOSIS — E538 Deficiency of other specified B group vitamins: Secondary | ICD-10-CM | POA: Diagnosis not present

## 2018-01-20 DIAGNOSIS — C9000 Multiple myeloma not having achieved remission: Secondary | ICD-10-CM | POA: Diagnosis present

## 2018-01-20 DIAGNOSIS — E119 Type 2 diabetes mellitus without complications: Secondary | ICD-10-CM

## 2018-01-20 DIAGNOSIS — D508 Other iron deficiency anemias: Secondary | ICD-10-CM

## 2018-01-20 DIAGNOSIS — Z5112 Encounter for antineoplastic immunotherapy: Secondary | ICD-10-CM | POA: Diagnosis not present

## 2018-01-20 LAB — IRON AND TIBC
IRON: 48 ug/dL (ref 28–170)
Saturation Ratios: 38 % — ABNORMAL HIGH (ref 10.4–31.8)
TIBC: 127 ug/dL — ABNORMAL LOW (ref 250–450)
UIBC: 79 ug/dL

## 2018-01-20 LAB — CBC WITH DIFFERENTIAL/PLATELET
BASOS ABS: 0 10*3/uL (ref 0.0–0.1)
BASOS PCT: 0 %
EOS PCT: 1 %
Eosinophils Absolute: 0.1 10*3/uL (ref 0.0–0.7)
HEMATOCRIT: 28.9 % — AB (ref 36.0–46.0)
HEMOGLOBIN: 9.1 g/dL — AB (ref 12.0–15.0)
LYMPHS ABS: 2.1 10*3/uL (ref 0.7–4.0)
LYMPHS PCT: 31 %
MCH: 32.6 pg (ref 26.0–34.0)
MCHC: 31.5 g/dL (ref 30.0–36.0)
MCV: 103.6 fL — AB (ref 78.0–100.0)
Monocytes Absolute: 0.5 10*3/uL (ref 0.1–1.0)
Monocytes Relative: 8 %
NEUTROS ABS: 4 10*3/uL (ref 1.7–7.7)
Neutrophils Relative %: 60 %
Platelets: 232 10*3/uL (ref 150–400)
RBC: 2.79 MIL/uL — ABNORMAL LOW (ref 3.87–5.11)
RDW: 14.3 % (ref 11.5–15.5)
WBC: 6.7 10*3/uL (ref 4.0–10.5)

## 2018-01-20 LAB — COMPREHENSIVE METABOLIC PANEL
ALBUMIN: 3.8 g/dL (ref 3.5–5.0)
ALK PHOS: 64 U/L (ref 38–126)
ALT: 10 U/L — ABNORMAL LOW (ref 14–54)
ANION GAP: 11 (ref 5–15)
AST: 16 U/L (ref 15–41)
BILIRUBIN TOTAL: 0.4 mg/dL (ref 0.3–1.2)
BUN: 21 mg/dL — ABNORMAL HIGH (ref 6–20)
CALCIUM: 10.1 mg/dL (ref 8.9–10.3)
CO2: 26 mmol/L (ref 22–32)
Chloride: 103 mmol/L (ref 101–111)
Creatinine, Ser: 1.03 mg/dL — ABNORMAL HIGH (ref 0.44–1.00)
GFR, EST AFRICAN AMERICAN: 59 mL/min — AB (ref >60)
GFR, EST NON AFRICAN AMERICAN: 51 mL/min — AB (ref >60)
GLUCOSE: 84 mg/dL (ref 65–99)
POTASSIUM: 3.8 mmol/L (ref 3.5–5.1)
Sodium: 140 mmol/L (ref 135–145)
TOTAL PROTEIN: 7.7 g/dL (ref 6.5–8.1)

## 2018-01-20 LAB — FERRITIN: Ferritin: 416 ng/mL — ABNORMAL HIGH (ref 11–307)

## 2018-01-20 NOTE — Progress Notes (Addendum)
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  Returns to review bone marrow biopsy ordered at her last visit due to monoclonal protein noted in the peripheral blood.  Prior history of iron deficiency anemia.  HPI:  [name] has been seeing Dr. Talbert Cage here for anemia,  Workup for anemia  showed an IgM monoclonal protein in the serum.  Therefore a bone marrow biopsy was ordered which was performed recently and showed 60% infiltration by plasma cells Two different clones were detected 1 clone had normal karyotype by FISH the other Clone had multiple hyperdiploid cells.   Labs show intermittent mild increase in serum creatinine today is at 1.03 mild hypercalcemia ranging between 10.1 and 10.5.  Ferritin was 416 today. Serum protein electrophoresis from November 2018 showed an increase in IgA to 897 with normal IgG and IgM M protein was measured at 0.9 but beta globulin was also increased at 1.7.  Immunofixation confirmed this to be IgA kappa protein.  Serum erythropoietin level was elevated at 18.7 vitamin B12 was 164 serum kappa light chains were 2048 and kappa light lambda light chains were 16.6 with a ratio 123. And chronic anemia hemoglobin is stable at 9 Recent iron panel did not show iron deficiency. CKD  Assessment and plan: Multiple myeloma IgA kappa variety presenting with anemia renal insufficiency and hypercalcemia.  I do not have urine assessments for Bence-Jones proteinuria and no skeletal survey is available.  Nevertheless given the profound anemia and hypercalcemia and renal insufficiency, she should start treatment. Today we discussed the treatment options and prognosis. I recommended triple regimen with Revlimid and bortezomib with Decadron.  Given her diabetes I would do a low-dose( 76m weekly) Decadron.   Potential side effects particularly with diabetic neuropathy there is increased risk of worsening neuropathy with  Velcade.  We will offer subcutaneous Velcade weekly. Risk of cytopenias, myelosuppression and risk of infections from neutropenia, fatigue from anemia and bleeding or easy bruising from thrombocytopenia have been explained.   She will be at high risk for varicella-zoster infection and DVT. She will need herpes zoster prophylaxis.  We will begin aspirin 81 mg a day to reduce her risk of thrombosis.   We will set schedule her for detailed teaching with regard to the side effects and the schedule next week we will plan on beginning this therapy within 2 weeks to avoid risk of progressive kidney disease.  We will also initiate Xgeva for bone strengthening she currently has no active dental issues.  Continue drinking plenty of fluids due to hypercalcemia.    She has a history of diabetes with peripheral neuropathy.   Potential for worsening diabetes and the potential need to start insulin while on therapy with high-dose steroids has been discussed with the patient. Stop IV iron B12 def- continue B12 monthly  I will request skeletal survey and a 24-hour urine collection for baseline measurement.  Patient should be referred to stem cell transplant evaluation once she achieves remission. Total face-to-face time spent today was 60 minutes greater than 45 minutes was spent counseling coordinating her care and reviewing pathology discussing prognosis discussing the side effects of treatment risks and benefits.

## 2018-01-21 LAB — TISSUE HYBRIDIZATION (BONE MARROW)-NCBH

## 2018-01-21 LAB — CHROMOSOME ANALYSIS, BONE MARROW

## 2018-01-25 ENCOUNTER — Telehealth (HOSPITAL_COMMUNITY): Payer: Self-pay | Admitting: Adult Health

## 2018-01-25 ENCOUNTER — Other Ambulatory Visit (HOSPITAL_COMMUNITY): Payer: Self-pay | Admitting: *Deleted

## 2018-01-25 DIAGNOSIS — C9000 Multiple myeloma not having achieved remission: Secondary | ICD-10-CM

## 2018-01-25 MED ORDER — LENALIDOMIDE 25 MG PO CAPS
ORAL_CAPSULE | ORAL | 3 refills | Status: DC
Start: 2018-01-25 — End: 2018-01-25

## 2018-01-25 MED ORDER — ONDANSETRON HCL 8 MG PO TABS
8.0000 mg | ORAL_TABLET | Freq: Two times a day (BID) | ORAL | 1 refills | Status: DC | PRN
Start: 2018-01-25 — End: 2018-01-26

## 2018-01-25 MED ORDER — ACYCLOVIR 400 MG PO TABS: 400.0000 mg | ORAL_TABLET | Freq: Two times a day (BID) | ORAL | 3 refills | Status: DC

## 2018-01-25 MED ORDER — DEXAMETHASONE 4 MG PO TABS: ORAL_TABLET | ORAL | 3 refills | Status: DC

## 2018-01-25 MED ORDER — PROCHLORPERAZINE MALEATE 10 MG PO TABS: 10.0000 mg | ORAL_TABLET | Freq: Four times a day (QID) | ORAL | 1 refills | Status: DC | PRN

## 2018-01-25 MED ORDER — LENALIDOMIDE 25 MG PO CAPS: ORAL_CAPSULE | ORAL | 3 refills | Status: DC

## 2018-01-25 NOTE — Telephone Encounter (Signed)
FAXED REVLIMID SCRIPT TO [facility] °

## 2018-01-25 NOTE — Patient Instructions (Signed)
[place]   CHEMOTHERAPY INSTRUCTIONS  We are going to start treating your multiple myeloma with velcade and dexamethasone and revlimid.   Velcade will be day 1, day 8, and day 15 every 21 days.  Thus having Velcade weekly.  You will take your dexamethasone dose weekly.  You will take Revlimid for 14 days in a row with 7 days off.  You will have your labs checked prior to having your injection every week.   You will see the doctor regularly throughout treatment.  We monitor your lab work prior to every treatment.  The doctor monitors your response to treatment by the way you are feeling and your blood work.  There will be wait times while you are here for treatment.  It will take about 30 minutes to 1 hour for your labs to result.  Then pharmacy has to mix your medications.    POTENTIAL SIDE EFFECTS OF TREATMENT: Bortezomib (Velcade)  About This Drug Bortezomib is used to treat cancer. It is given in the vein (IV) or by a shot under the skin (subcutaneously)  Possible Side Effects . Bone marrow depression. Decrease in the number of white blood cells, red blood cells, and platelets. This may raise your risk of infection, make you tired and weak (fatigue), and raise your risk of bleeding. . Nausea and vomiting (throwing up) . Decreased appetite (decreased hunger) . Constipation (not able to move bowels) . Loose bowel movements (diarrhea) . Neuropathy. Effects on the nerves are called peripheral neuropathy. You may feel numbness, tingling, or pain in your hands and feet. It may be hard for you to button your clothes, open jars, or walk as usual. The effect on the nerves may get worse with more doses of the drug. These effects get better in some people after the drug is stopped but it does not get better in all people. . Tiredness . Rash . Fever Note: Each of the side effects above was reported in 20% or greater of patients treated with bortezomib. Not all  possible side effects are included above.  Warnings and Precautions . Low blood pressure . Congestive heart failure. You may be short of breath. Your arms, hands, legs and feet may swell. . Inflammation (swelling) of the lungs. You may have a dry cough or trouble breathing. . A partial or complete blockage of your small and/or large intestine. . Changes in your central nervous system can happen. The central nervous system is made up of your brain and spinal cord. You could feel extreme tiredness, agitation, confusion, hallucinations (see or hear things that are not there), trouble understanding or speaking, loss of control of your bowels or bladder, eyesight changes, numbness or lack of strength to your arms, legs, face, or body, and coma. If you start to have any of these symptoms let your doctor know right away. . Tumor lysis: This drug may act on the cancer cells very quickly. This may affect how your kidneys work. . Changes in your liver function  Important Information . This drug may be present in the saliva, tears, sweat, urine, stool, vomit, semen, and vaginal secretions. Talk to your doctor and/or your nurse about the necessary precautions to take during this time. . This drug may impair your ability to drive or use machinery. Use caution and tell your nurse or doctor if you feel dizzy, very sleepy, and/or experience low blood pressure.  Treating Side Effects . Manage tiredness by pacing your activities  for the day. . Be sure to include periods of rest between energy-draining activities. Wendee Copp your hands regularly. . Avoid close contact with people who have a cold, the flu, or other infections. . Use a soft toothbrush. Check with your nurse before using dental floss. . Be very careful when using knives or tools. . Use an electric shaver instead of a razor. . Ask your doctor or nurse about medicines that are available to help stop or lessen constipation. . If you are not able to move  your bowels, check with your doctor or nurse before you use enemas, laxatives, or suppositories. . Drink plenty of fluids (a minimum of eight glasses per day is recommended). . If you throw up or have loose bowel movements, you should drink more fluids so that you do not become dehydrated (lack water in the body from losing too much fluid). . If you get diarrhea, eat low-fiber foods that are high in protein and calories and avoid foods that can irritate your digestive tracts or lead to cramping. . Ask your nurse or doctor about medicine that can lessen or stop your diarrhea. . To help with nausea and vomiting, eat small, frequent meals instead of three large meals a day. Choose foods and drinks that are at room temperature. Ask your nurse or doctor about other helpful tips and medicine that is available to help or stop lessen these symptoms. . To help with decreased appetite, eat small, frequent meals. . Eat high caloric food such as pudding, ice cream, yogurt and milkshakes. . If you have numbness and tingling in your hands and feet, be careful when cooking, walking, and handling sharp objects and hot liquids. . If you get a rash do not put anything on it unless your doctor or nurse says you may. Keep the area around the rash clean and dry. Ask your doctor for medicine if your rash bothers you. . Signs of tumor lysis: Confusion or agitation, decreased urine, nausea/vomiting, diarrhea, muscle cramping, numbness and/or tingling, seizures.  Food and Drug Interactions . There are no known interactions of bortezomib with food. . Check with your doctor or pharmacist about all other prescription medicines and dietary supplements you are taking before starting this medicine as there are a lot of known drug interactions with bortezemib. Also, check with your doctor or pharmacist before starting any new prescription or over-the-counter medicines, or dietary supplement to make sure that there are no  interactions. . Avoid the use of St. John's Wort with bortezomib as this may lower the levels of the drug in your body, which can make it less effective.   When to Call the Doctor Call your doctor or nurse if you have any of these symptoms and/or any new or unusual symptoms: . Fever of 100.5 F (38 C) or higher . Chills . Fatigue that interferes with your daily activities . Feeling dizzy or lightheaded . Feeling that your heart is beating in a fast or not normal way (palpitations) . Trouble breathing . Swelling of legs, ankles, or feet . Weight gain of 5 pounds in one week (fluid retention) . Easy bleeding or bruising . Confusion and/or agitation . Hallucinations . Trouble understanding or speaking . Blurry vision or changes in your eyesight . Numbness or lack of strength to your arms, legs, face, or body . No bowel movement in 3 days or when you feel uncomfortable. . Abdominal pain that does not go away . Loose bowel movements (diarrhea) 4 times a  day or loose bowel movements with lack of strength or a feeling of being dizzy . Nausea that stops you from eating or drinking and/or is not relieved by prescribed medicines . Throwing up more than 3 times a day . Numbness, tingling, or pain your hands and feet . New rash and/or itching . Rash that is not relieved by prescribed medicines . Lasting loss of appetite or rapid weight loss of five pounds in a week . Signs of tumor lysis: Confusion or agitation, decreased urine, nausea/vomiting, diarrhea, muscle cramping, numbness and/or tingling, seizures. . Signs of possible liver problems: dark urine, pale bowel movements, bad stomach pain, feeling very tired and weak, unusual itching, or yellowing of the eyes or skin . If you think you are pregnant or may have impregnated your partner  Reproduction Warnings . Pregnancy warning: This drug can have harmful effects on the unborn baby. Women of child bearing potential should use effective  methods of birth control during your cancer treatment and for at least 2 months after treatment. Men with female partners of child bearing potential should use effective methods of birth control during your cancer treatment and for at least 2 months after your cancer treatment. Let your doctor know right away if you think you may be pregnant or may have impregnated your partner. . Breastfeeding warning: Women should not breast feed during treatment and for at least 2 months month after treatment because this drug could enter the breast milk and cause harm to a breast feeding baby. . Fertility warning: In men and women both, this drug may affect your ability to have children in the future. Talk with your doctor or nurse if you plan to have children. Ask for information on sperm or egg banking.   Lenalidomide (Revlimid)  About This Drug Lenalidomide is used to treat cancer. It is given orally (by mouth).  Possible Side Effects . Bone marrow depression. This is a decrease in the number of white blood cells, red blood cells, and platelets. This may raise your risk of infection, make you tired and weak (fatigue), and raise your risk of bleeding . Fever . Tiredness and weakness . Feeling dizzy . Trouble sleeping . Upper respiratory infection, bronchitis . Inflammation of the nasal passages and throat . Trouble breathing . Cough . Nausea . Decreased appetite (decreased hunger) . Loose bowel movements (diarrhea) . Constipation (unable to move bowels) . Inflammation of your stomach and/or intestines . Pain in your abdomen . Pain in your joints . Swelling of your legs, ankles and/or feet . Muscle cramps/spasms . Back pain . Rash and itching . Tremor Note: Each of the side effects above was reported in 15% or greater of patients treated with lenalidomide. Not all possible side effects are included above.  Warnings and Precautions . Blood clots and events such as stroke and heart attack. A  blood clot in your leg may cause your leg to swell, appear red and warm, and/or cause pain. A blood clot in your lungs may cause trouble breathing, pain when breathing, and/or chest pain. . Severe bone marrow depression . Changes in your liver function, which may cause liver failure and be life-threatening . Tumor lysis syndrome: This drug may act on the cancer cells very quickly. This may affect how your kidneys work and can be life-threatening. . Changes in your thyroid function . Severe allergic skin reaction which may be life-threatening. You may develop blisters on your skin that are filled with fluid or a severe  red rash all over your body that may be painful. . This drug may raise your risk of getting a second cancer . You may develop a syndrome called tumor flare reaction. You may have painful lymph nodes, enlarged spleen, fever and a rash. . This drug may make it more difficult to collect your stem cells if an autologous stem cell transplant is part of your treatment plan. . There is a rare increased risk of death in patients with chronic lymphocytic leukemia and a risk of early death (dying sooner) in patient with mantle cell lymphoma Note: Some of the side effects above are very rare. If you have concerns and/or questions, please discuss them with your medical team.  Important Information . You will need to sign up for a special program called Revlimid REMS when you start taking this drug. Your nurse will help you get started. . Do not donate blood during your treatment and for 4 weeks after your treatment. . For males only, do not donate sperm during your treatment and for 4 weeks after your treatment because this drug is present in semen and may badly harm a baby.  How to Take Your Medication . Swallow the medicine whole with water, with or without food. Do not chew, break, or open it. . Take this medicine at the same time each day . Missed dose: If you miss a dose, take it as soon  as you think about it ONLY if it has been less than 12 hours since your regular time. If it has been more than 12 hours, skip the missed dose and contact your physician. Take your next dose at the regular time. Do not take 2 doses at the same time and do not double up on the next dose. . If you vomit a dose, take your next dose at the regular time. . Handling: Wash your hands after handling your medicine, your caretakers should not handle your medicine with bare hands and should wear latex gloves. . If you get any of the content of a broken capsules on your skin, you should wash the area of the skin well with soap and water right away. Call your doctor if you get a skin reaction . This drug may be present in the saliva, tears, sweat, urine, stool, vomit, semen, and vaginal secretions. Talk to your doctor and/or your nurse about the necessary precautions to take during this time. . Storage: Store this medicine in the original container at room temperature. Discuss with your nurse or your doctor how to dispose of unused medicine Treating Side Effects . Manage tiredness by pacing your activities for the day. . Be sure to include periods of rest between energy-draining activities. . To decrease infection, wash your hands regularly. . Avoid close contact with people who have a cold, the flu, or other infections. . Take your temperature as your doctor or nurse tells you, and whenever you feel like you may have a fever. . To help decrease bleeding, use a soft toothbrush. Check with your nurse before using dental floss. . Be very careful when using knives or tools. . Use an electric shaver instead of a razor. . Ask your doctor or nurse about medicines that are available to help stop or lessen constipation. . If you are not able to move your bowels, check with your doctor or nurse before you use enemas, laxatives, or suppositories. . Drink plenty of fluids (a minimum of eight glasses per day is  recommended). . If  you throw up or have loose bowel movements, you should drink more fluids so that you do not become dehydrated (lack water in the body from losing too much fluid). . If you get diarrhea, eat low-fiber foods that are high in protein and calories and avoid foods that can irritate your digestive tracts or lead to cramping. . Ask your nurse or doctor about medicine that can lessen or stop your diarrhea. . To help with nausea and vomiting, eat small, frequent meals instead of three large meals a day. Choose foods and drinks that are at room temperature. Ask your nurse or doctor about other helpful tips and medicine that is available to help or stop lessen these symptoms. . To help with decreased appetite, eat small, frequent meals. . Eat high caloric food such as pudding, ice cream, yogurt and milkshakes. . If you get a rash, do not put anything on it unless your doctor or nurse says you may. Keep the area around the rash clean and dry. Ask your doctor for medicine if your rash bothers you. Marland Kitchen Keeping your pain under control is important to your well-being. Please tell your doctor or nurse if you are experiencing pain. . If you are having trouble sleeping, talk to your nurse or doctor on tips to help you sleep better.  Food and Drug Interactions . There are no known interactions of lenalidomide with food. . Check with your doctor or pharmacist about all other prescription medicines and dietary supplements you are taking before starting this medicine as there are known drug interactions with lenalidomide. Also, check with your doctor or pharmacist before starting any new prescription or over-the-counter medicines, or dietary supplement to make sure that there are no interactions.  When to Call the Doctor Call your doctor or nurse if you have any of these symptoms and/or any new or unusual symptoms: . Fever of 100.5 F (38 C) or higher . Chills . Fatigue that interferes with your daily  activities . Feeling dizzy or lightheaded . Easy bleeding or bruising . Your leg or arm is swollen, red, warm and/or painful . Painful lymph nodes . Wheezing and/or trouble breathing . Chest pain or symptoms of a heart attack. Most heart attacks involve pain in the center of the chest that lasts more than a few minutes. The pain may go away and come back. It can feel like pressure, squeezing, fullness, or pain. Sometimes pain is felt in one or both arms, the back, neck, jaw, or stomach. If any of these symptoms last 2 minutes, call 911. Marland Kitchen Symptoms of a stroke such as sudden numbness or weakness of your face, arm, or leg, mostly on one side of your body; sudden confusion, trouble speaking or understanding; sudden trouble seeing in one or both eyes; sudden trouble walking, feeling dizzy, loss of balance or coordination; or sudden, bad headache with no known cause. If you have any of these symptoms for 2 minutes, call 911. . Wheezing or trouble breathing . You cough up yellow, green, or bloody mucus . Feeling that your heart is beating in a fast or not normal way (palpitations) . Nausea that stops you from eating or drinking and/or is not relieved by prescribed medicines . Throwing up more than 3 times a day . Loose bowel movements (diarrhea) 4 times a day or loose bowel movements with lack of strength or a feeling of being dizzy . No bowel movement in 3 days or when you feel uncomfortable . Pain in your  abdomen that does not go away . Weight gain of 5 pounds in one week (fluid retention) . Unexplained weight gain . Lasting loss of appetite or rapid weight loss of five pounds in a week . Pain that does not go away, or is not relieved by prescribed medicines . Flu-like symptoms: fever, headache, muscle and joint aches, and fatigue (low energy, feeling weak) . A new rash or a rash that is not relieved by prescribed medicines . Signs of possible liver problems: dark urine, pale bowel movements, bad  stomach pain, feeling very tired and weak, unusual itching, or yellowing of the eyes or skin . Signs of tumor lysis: Confusion or agitation, decreased urine, nausea/vomiting, diarrhea, muscle cramping, numbness and/or tingling, seizures. . If you think you may be pregnant or may have impregnated your partner  Reproduction Warnings . Pregnancy warning: This drug can have harmful effects on the unborn baby. Women of childbearing potential should use 2 effective methods of birth control, one of which, must be a highly effective method of birth control, beginning 4 weeks before treatment starts, during your cancer treatment, including dose interruptions, and for at least 4 weeks after treatment. A highly effective method of birth control includes tubal ligation, intra-uterine device (IUD), hormonal (birth control pills, injections, patch and/or implants) or a partner's vasectomy. Let your doctor know right away if you think you may be pregnant . Two negative pregnancy tests are required in women of child-bearing potential prior to starting treatment. . You will need to have routine pregnancy tests while you are taking this drug. . Men with female partners of child-bearing potential should use effective methods of birth control during your cancer treatment and for at least 4 weeks after your cancer treatment. You should always wear a condom even if you have undergone a successful vasectomy. Let your doctor know right away if you think you may have impregnated your partner. . Breastfeeding warning: Women should not breast feed during treatment because this drug could enter the breast milk and cause harm to a breast feeding baby. . Fertility warning: Human fertility studies have not been done with this drug. Talk with your doctor or nurse if you plan to have children. Ask for information on sperm or egg banking.     SELF CARE ACTIVITIES WHILE ON CHEMOTHERAPY:  Hydration Increase your fluid intake 48  hours prior to treatment and drink at least 8 to 12 cups (64 ounces) of water/decaff beverages per day after treatment. You can still have your cup of coffee or soda but these beverages do not count as part of your 8 to 12 cups that you need to drink daily. No alcohol intake.  Medications Continue taking your normal prescription medication as prescribed.  If you start any new herbal or new supplements please let us know first to make sure it is safe.  Mouth Care Have teeth cleaned professionally before starting treatment. Keep dentures and partial plates clean. Use soft toothbrush and do not use mouthwashes that contain alcohol. Biotene is a good mouthwash that is available at most pharmacies or may be ordered by calling [phone]. Use warm salt water gargles (1 teaspoon salt per 1 quart warm water) before and after meals and at bedtime. Or you may rinse with 2 tablespoons of three-percent hydrogen peroxide mixed in eight ounces of water. If you are still having problems with your mouth or sores in your mouth please call the clinic. If you need dental work, please let the doctor  know before you go for your appointment so that we can coordinate the best possible time for you in regards to your chemo regimen. You need to also let your dentist know that you are actively taking chemo. We may need to do labs prior to your dental appointment.  Skin Care Always use sunscreen that has not expired and with SPF (Sun Protection Factor) of 50 or higher. Wear hats to protect your head from the sun. Remember to use sunscreen on your hands, ears, face, & feet.  Use good moisturizing lotions such as udder cream, eucerin, or even Vaseline. Some chemotherapies can cause dry skin, color changes in your skin and nails.    . Avoid long, hot showers or baths. . Use gentle, fragrance-free soaps and laundry detergent. . Use moisturizers, preferably creams or ointments rather than lotions because the thicker consistency  is better at preventing skin dehydration. Apply the cream or ointment within 15 minutes of showering. Reapply moisturizer at night, and moisturize your hands every time after you wash them.  Hair Loss (if your doctor says your hair will fall out)  . If your doctor says that your hair is likely to fall out, decide before you begin chemo whether you want to wear a wig. You may want to shop before treatment to match your hair color. . Hats, turbans, and scarves can also camouflage hair loss, although some people prefer to leave their heads uncovered. If you go bare-headed outdoors, be sure to use sunscreen on your scalp. . Cut your hair short. It eases the inconvenience of shedding lots of hair, but it also can reduce the emotional impact of watching your hair fall out. . Don't perm or color your hair during chemotherapy. Those chemical treatments are already damaging to hair and can enhance hair loss. Once your chemo treatments are done and your hair has grown back, it's OK to resume dyeing or perming hair. With chemotherapy, hair loss is almost always temporary. But when it grows back, it may be a different color or texture. In older adults who still had hair color before chemotherapy, the new growth may be completely gray.  Often, new hair is very fine and soft. Infection Prevention Please wash your hands for at least 30 seconds using warm soapy water. Handwashing is the #1 way to prevent the spread of germs. Stay away from sick people or people who are getting over a cold. If you develop respiratory systems such as green/yellow mucus production or productive cough or persistent cough let us know and we will see if you need an antibiotic. It is a good idea to keep a pair of gloves on when going into grocery stores/[employer] to decrease your risk of coming into contact with germs on the carts, etc. Carry alcohol hand gel with you at all times and use it frequently if out in public. If your temperature reaches  100.5 or higher please call the clinic and let us know.  If it is after hours or on the weekend please go to the ER if your temperature is over 100.5.  Please have your own personal thermometer at home to use.    Sex and bodily fluids If you are going to have sex, a condom must be used to protect the person that isn't taking chemotherapy. Chemo can decrease your libido (sex drive). For a few days after chemotherapy, chemotherapy can be excreted through your bodily fluids.  When using the toilet please close the lid and flush the  toilet twice.  Do this for a few day after you have had chemotherapy.   Effects of chemotherapy on your sex life Some changes are simple and won't last long. They won't affect your sex life permanently. Sometimes you may feel: . too tired . not strong enough to be very active . sick or sore  . not in the mood . anxious or low Your anxiety might not seem related to sex. For example, you may be worried about the cancer and how your treatment is going. Or you may be worried about money, or about how you family are coping with your illness. These things can cause stress, which can affect your interest in sex. It's important to talk to your partner about how you feel. Remember - the changes to your sex life don't usually last long. There's usually no medical reason to stop having sex during chemo. The drugs won't have any long term physical effects on your performance or enjoyment of sex. Cancer can't be passed on to your partner during sex  Contraception It's important to use reliable contraception during treatment. Avoid getting pregnant while you or your partner are having chemotherapy. This is because the drugs may harm the baby. Sometimes chemotherapy drugs can leave a man or woman infertile.  This means you would not be able to have children in the future. You might want to talk to someone about permanent infertility. It can be very difficult to learn that you may no  longer be able to have children. Some people find counselling helpful. There might be ways to preserve your fertility, although this is easier for men than for women. You may want to speak to a fertility expert. You can talk about sperm banking or harvesting your eggs. You can also ask about other fertility options, such as donor eggs. If you have or have had breast cancer, your doctor might advise you not to take the contraceptive pill. This is because the hormones in it might affect the cancer.  It is not known for sure whether or not chemotherapy drugs can be passed on through semen or secretions from the vagina. Because of this some doctors advise people to use a barrier method if you have sex during treatment. This applies to vaginal, anal or oral sex. Generally, doctors advise a barrier method only for the time you are actually having the treatment and for about a week after your treatment. Advice like this can be worrying, but this does not mean that you have to avoid being intimate with your partner. You can still have close contact with your partner and continue to enjoy sex.  Animals If you have cats or birds we just ask that you not change the litter or change the cage.  Please have someone else do this for you while you are on chemotherapy.   Food Safety During and After Cancer Treatment Food safety is important for people both during and after cancer treatment. Cancer and cancer treatments, such as chemotherapy, radiation therapy, and stem cell/bone marrow transplantation, often weaken the immune system. This makes it harder for your body to protect itself from foodborne illness, also called food poisoning. Foodborne illness is caused by eating food that contains harmful bacteria, parasites, or viruses.    Foods to avoid Some foods have a higher risk of becoming tainted with bacteria. These include: Marland Kitchen Unwashed fresh fruit and vegetables, especially leafy vegetables that can hide dirt  and other contaminants . Raw sprouts, such as alfalfa  sprouts . Raw or undercooked beef, especially ground beef, or other raw or undercooked meat and poultry . Fatty, fried, or spicy foods immediately before or after treatment.  These can sit heavy on your stomach and make you feel nauseous. . Raw or undercooked shellfish, such as oysters. . Sushi and sashimi, which often contain raw fish.  . Unpasteurized beverages, such as unpasteurized fruit juices, raw milk, raw yogurt, or cider . Undercooked eggs, such as soft boiled, over easy, and poached; raw, unpasteurized eggs; or foods made with raw egg, such as homemade raw cookie dough and homemade mayonnaise Simple steps for food safety Shop smart. . Do not buy food stored or displayed in an unclean area. . Do not buy bruised or damaged fruits or vegetables. . Do not buy cans that have cracks, dents, or bulges. . Pick up foods that can spoil at the end of your shopping trip and store them in a cooler on the way home. Prepare and clean up foods carefully. . Rinse all fresh fruits and vegetables under running water, and dry them with a clean towel or paper towel. . Clean the top of cans before opening them. . After preparing food, wash your hands for 20 seconds with hot water and soap. Pay special attention to areas between fingers and under nails. . Clean your utensils and dishes with hot water and soap. Marland Kitchen Disinfect your kitchen and cutting boards using 1 teaspoon of liquid, unscented bleach mixed into 1 quart of water.   Dispose of old food. . Eat canned and packaged food before its expiration date (the "use by" or "best before" date). . Consume refrigerated leftovers within 3 to 4 days. After that time, throw out the food. Even if the food does not smell or look spoiled, it still may be unsafe. Some bacteria, such as Listeria, can grow even on foods stored in the refrigerator if they are kept for too long. Take precautions when eating out. . At  restaurants, avoid buffets and salad bars where food sits out for a long time and comes in contact with many people. Food can become contaminated when someone with a virus, often a norovirus, or another "bug" handles it. . Put any leftover food in a "to-go" container yourself, rather than having the server do it. And, refrigerate leftovers as soon as you get home. . Choose restaurants that are clean and that are willing to prepare your food as you order it cooked.    MEDICATIONS:  Dexamethasone 50m tablet.  Take weekly as prescribed.  Acyclovir 400 mg tablets:  Take 1 tablet (400 mg total) by mouth 2 (two) times daily.  Zofran/Ondansetron 79m tablet. Take 1 tablet every 8 hours as needed for nausea/vomiting. (#1 nausea med to take, this can constipate)  Compazine/Prochlorperazine 17mtablet. Take 1 tablet every 6 hours as needed for nausea/vomiting. (#2 nausea med to take, this can make you sleepy)   Over-the-Counter Meds:  Miralax 1 capful in 8 oz of fluid daily. May increase to two times a day if needed. This is a stool softener. If this doesn't work proceed you can add:  Senokot S-start with 1 tablet two times a day and increase to 4 tablets two times a day if needed. (total of 8 tablets in a 24 hour period). This is a stimulant laxative.   Call us[place]af this does not help your bowels move.   Imodium 34m60mapsule. Take 2 capsules after the 1st loose stool and then 1 capsule every 2 hours until you go a total of 12 hours without having a loose stool. Call the [place] loose stools continue. If diarrhea occurs @ bedtime, take 2 capsules @ bedtime. Then take 2 capsules every 4 hours until morning. Call [place] Diarrhea Sheet  If you are having loose stools/diarrhea, please purchase Imodium and begin taking as outlined:   At the first sign of poorly formed or loose stools you should begin taking Imodium(loperamide) 2 mg capsules.  Take two caplets (4mg98mollowed by one caplet (34mg)37mery 2 hours until you have had no diarrhea for 12 hours.  During the night take two caplets (4mg) 1mbedtime and continue every 4 hours during the night until the morning.  Stop taking Imodium only after there is no sign of diarrhea for 12 hours.    Always call the [facility] are having loose stools/diarrhea that you can't get under control.  Loose stools/disrrhea leads to dehydration (loss of water) in your body.  We have other options of trying to get the loose stools/diarrhea to stopped but you must let us knoKorea    Constipation Sheet *Miralax in 8 oz of fluid daily.  May increase to two times a day if needed.  This is a stool softener.  If this not enough to keep your bowel regular:  You can add:  *Senokot S, start with one tablet twice a day and can increase to 4 tablets twice a day if needed.  This is a stimulant laxative.   Sometimes when you take pain medication you need BOTH a medicine to keep your stool soft and a medicine to help your bowel push it out!  Please call if the above does not work for you.   Do not go more than 2 days without a bowel movement.  It is very important that you do not become constipated.  It will make you feel sick to your stomach (nausea) and can cause abdominal pain and vomiting.    Nausea Sheet  Zofran/Ondansetron 8mg ta21mt. Take 1 tablet every 8 hours as needed for nausea/vomiting. (#1 nausea med to take, this can constipate)  Compazine/Prochlorperazine 10mg ta51m. Take 1 tablet every 6 hours as needed for nausea/vomiting. (#2 nausea med to take, this can make you sleepy)  You can take these medications together or separately.  We would first like for you to try the Ondansetron by itself and then take the Prochloperizine if needed. But you are allowed to take both medications  at the same time if your nausea is that severe.  If you are having persistent nausea (nausea that does not  stop) please take these medications on a staggered schedule so that the nausea medication stays in your body.  Please call the [facility] and let us know the amount of nausea that you are experiencing.  If you begin to vomit, you need to call the [place] and if it is the weekend and you have vomited more than one time and cant get it to stop-go to the Emergency Room.  Persistent nausea/vomiting can lead to dehydration (loss of fluid in your body) and will make you feel terrible.   Ice chips, sips of clear liquids, foods that are @ room temperature, crackers, and toast tend to be better tolerated.    SYMPTOMS TO REPORT AS SOON AS POSSIBLE AFTER TREATMENT:  FEVER GREATER THAN 100.5 F  CHILLS WITH OR WITHOUT FEVER  NAUSEA AND VOMITING THAT IS NOT CONTROLLED WITH YOUR NAUSEA MEDICATION  UNUSUAL SHORTNESS OF BREATH  UNUSUAL BRUISING OR BLEEDING  TENDERNESS IN MOUTH AND THROAT WITH OR WITHOUT PRESENCE OF ULCERS  URINARY PROBLEMS  BOWEL PROBLEMS  UNUSUAL RASH     Wear comfortable clothing and clothing appropriate for easy access to any Portacath or PICC line. Let us know if there is anything that we can do to make your therapy better!    What to do if you need assistance after hours or on the weekends: CALL [phone].  HOLD on the line, do not hang up.  You will hear multiple messages but at the end you will be connected with a nurse triage line.  They will contact the doctor if necessary.  Most of the time they will be able to assist you.  Do not call the hospital operator.      I have been informed and understand all of the instructions given to me and have received a copy. I have been instructed to call the clinic [phone] or my family physician as soon as possible for continued medical care, if indicated. I do not have any more questions at this time but  understand that I may call the [facility] or the Patient Navigator at [phone] during office hours should I have questions or need assistance in obtaining follow-up care.

## 2018-01-26 ENCOUNTER — Inpatient Hospital Stay (HOSPITAL_COMMUNITY): Payer: Medicare Other

## 2018-01-26 ENCOUNTER — Encounter (HOSPITAL_COMMUNITY): Payer: Self-pay | Admitting: Emergency Medicine

## 2018-01-26 ENCOUNTER — Encounter (HOSPITAL_COMMUNITY): Payer: Self-pay

## 2018-01-26 ENCOUNTER — Other Ambulatory Visit: Payer: Self-pay

## 2018-01-26 ENCOUNTER — Telehealth (HOSPITAL_COMMUNITY): Payer: Self-pay | Admitting: Emergency Medicine

## 2018-01-26 VITALS — BP 126/50 | HR 66 | Temp 98.7°F | Resp 20 | Wt 178.4 lb

## 2018-01-26 DIAGNOSIS — C9000 Multiple myeloma not having achieved remission: Secondary | ICD-10-CM

## 2018-01-26 DIAGNOSIS — Z5112 Encounter for antineoplastic immunotherapy: Secondary | ICD-10-CM | POA: Diagnosis not present

## 2018-01-26 LAB — COMPREHENSIVE METABOLIC PANEL
ALK PHOS: 68 U/L (ref 38–126)
ALT: 12 U/L — ABNORMAL LOW (ref 14–54)
AST: 16 U/L (ref 15–41)
Albumin: 4.1 g/dL (ref 3.5–5.0)
Anion gap: 11 (ref 5–15)
BUN: 18 mg/dL (ref 6–20)
CALCIUM: 10.5 mg/dL — AB (ref 8.9–10.3)
CHLORIDE: 102 mmol/L (ref 101–111)
CO2: 26 mmol/L (ref 22–32)
CREATININE: 1.04 mg/dL — AB (ref 0.44–1.00)
GFR calc Af Amer: 59 mL/min — ABNORMAL LOW (ref >60)
GFR, EST NON AFRICAN AMERICAN: 50 mL/min — AB (ref >60)
Glucose, Bld: 69 mg/dL (ref 65–99)
Potassium: 3.6 mmol/L (ref 3.5–5.1)
Sodium: 139 mmol/L (ref 135–145)
Total Bilirubin: 0.7 mg/dL (ref 0.3–1.2)
Total Protein: 8 g/dL (ref 6.5–8.1)

## 2018-01-26 LAB — CBC WITH DIFFERENTIAL/PLATELET
BASOS ABS: 0 10*3/uL (ref 0.0–0.1)
Basophils Relative: 0 %
EOS PCT: 2 %
Eosinophils Absolute: 0.1 10*3/uL (ref 0.0–0.7)
HCT: 30.1 % — ABNORMAL LOW (ref 36.0–46.0)
HEMOGLOBIN: 9.9 g/dL — AB (ref 12.0–15.0)
LYMPHS PCT: 34 %
Lymphs Abs: 2.2 10*3/uL (ref 0.7–4.0)
MCH: 33.4 pg (ref 26.0–34.0)
MCHC: 32.9 g/dL (ref 30.0–36.0)
MCV: 101.7 fL — AB (ref 78.0–100.0)
Monocytes Absolute: 0.5 10*3/uL (ref 0.1–1.0)
Monocytes Relative: 7 %
NEUTROS ABS: 3.7 10*3/uL (ref 1.7–7.7)
NEUTROS PCT: 57 %
PLATELETS: 236 10*3/uL (ref 150–400)
RBC: 2.96 MIL/uL — ABNORMAL LOW (ref 3.87–5.11)
RDW: 14.5 % (ref 11.5–15.5)
WBC: 6.5 10*3/uL (ref 4.0–10.5)

## 2018-01-26 MED ORDER — PROCHLORPERAZINE MALEATE 10 MG PO TABS: 10.0000 mg | ORAL_TABLET | Freq: Four times a day (QID) | ORAL | 1 refills | Status: DC | PRN

## 2018-01-26 MED ORDER — ONDANSETRON HCL 8 MG PO TABS: 8.0000 mg | ORAL_TABLET | Freq: Two times a day (BID) | ORAL | 1 refills | Status: DC | PRN

## 2018-01-26 MED ORDER — ACYCLOVIR 400 MG PO TABS: 400.0000 mg | ORAL_TABLET | Freq: Two times a day (BID) | ORAL | 3 refills | Status: DC

## 2018-01-26 MED ORDER — BORTEZOMIB CHEMO SQ INJECTION 3.5 MG (2.5MG/ML)
1.3000 mg/m2 | Freq: Once | INTRAMUSCULAR | Status: AC
  Administered 2018-01-26: 2.5 mg via SUBCUTANEOUS
  Filled 2018-01-26: qty 2.5

## 2018-01-26 MED ORDER — DENOSUMAB 120 MG/1.7ML ~~LOC~~ SOLN
120.0000 mg | Freq: Once | SUBCUTANEOUS | Status: AC
  Administered 2018-01-26: 120 mg via SUBCUTANEOUS
  Filled 2018-01-26: qty 1.7

## 2018-01-26 MED ORDER — PROCHLORPERAZINE MALEATE 10 MG PO TABS
10.0000 mg | ORAL_TABLET | Freq: Once | ORAL | Status: AC
  Administered 2018-01-26: 10 mg via ORAL
  Filled 2018-01-26: qty 1

## 2018-01-26 MED ORDER — DEXAMETHASONE 4 MG PO TABS: ORAL_TABLET | ORAL | 3 refills | Status: DC

## 2018-01-26 NOTE — Progress Notes (Signed)
Chemotherapy education completed.  Extensive teaching packet given.  Calender of appt and medications given.  Consent signed.

## 2018-01-26 NOTE — Progress Notes (Signed)
Careen Mauch Bautch presents today for injection per MD orders. Velcade 2.5mg  administered SQ in left Abdomen. Administration without incident. Patient tolerated well. Labs reviewed with Dr. [place]. Proceed with treatment today.   Lurae Hornbrook Mcmackin presents today for injection per MD orders. Xgeva 120 mcg administered SQ in right Abdomen. Administration without incident. Patient tolerated well.   Treatment given per orders. Patient tolerated it well without problems. Vitals stable and discharged home from clinic ambulatory. Follow up as scheduled.

## 2018-01-26 NOTE — Progress Notes (Signed)
RVD, x-geva teaching pulled together.  Calender made of appt and when to take medications.

## 2018-01-26 NOTE — Telephone Encounter (Signed)
Per Dr Sherrine Maples, she wants pt to hold off on taking calcium supplement at this time.  Notified the pt.  Explained that we would let her know when to start taking calcium.

## 2018-01-26 NOTE — Patient Instructions (Signed)
[facility] Discharge Instructions for Patients Receiving Chemotherapy   Beginning January 23rd 2017 lab work for the [facility] will be done in the  Main lab at [facility] on 1st floor. If you have a lab appointment with the [facility] please come in thru the  Main Entrance and check in at the main information desk  Delton See given today also.  Today you received the following chemotherapy agents   To help prevent nausea and vomiting after your treatment, we encourage you to take your nausea medication     If you develop nausea and vomiting, or diarrhea that is not controlled by your medication, call the clinic.  The clinic phone number is [phone]. Office hours are Monday-Friday 8:30am-5:00pm.  BELOW ARE SYMPTOMS THAT SHOULD BE REPORTED IMMEDIATELY:  *FEVER GREATER THAN 101.0 F  *CHILLS WITH OR WITHOUT FEVER  NAUSEA AND VOMITING THAT IS NOT CONTROLLED WITH YOUR NAUSEA MEDICATION  *UNUSUAL SHORTNESS OF BREATH  *UNUSUAL BRUISING OR BLEEDING  TENDERNESS IN MOUTH AND THROAT WITH OR WITHOUT PRESENCE OF ULCERS  *URINARY PROBLEMS  *BOWEL PROBLEMS  UNUSUAL RASH Items with * indicate a potential emergency and should be followed up as soon as possible. If you have an emergency after office hours please contact your primary care physician or go to the nearest emergency department.  Please call the clinic during office hours if you have any questions or concerns.   You may also contact the Patient Navigator at [phone] should you have any questions or need assistance in obtaining follow up care.      Resources For Cancer Patients and their Caregivers ? American Cancer Society: Can assist with transportation, wigs, general needs, runs Look Good Feel Better.        458-732-4941 ? Cancer Care: Provides financial assistance, online support groups, medication/co-pay assistance.  1-800-813-HOPE (402)478-3679) ? [place] Assists  [place] cancer patients and their families through emotional , educational and financial support.  ([phone] 586-2494 ? [place] DSS Where to apply for food stamps, Medicaid and utility assistance. 614-606-5368 ? RCATS: Transportation to medical appointments. ([phone] ? Social Security Administration: May apply for disability if have a Stage IV cancer. [phone] 763-775-4368 ? [employer], Disability and Transit Services: Assists with nutrition, care and transit needs. [phone]

## 2018-01-27 ENCOUNTER — Telehealth (HOSPITAL_COMMUNITY): Payer: Self-pay

## 2018-01-27 NOTE — Telephone Encounter (Signed)
24 hour follow up - patient is doing good, no complaints or concerns at this time.

## 2018-01-31 ENCOUNTER — Ambulatory Visit (HOSPITAL_COMMUNITY): Payer: Medicare Other

## 2018-01-31 ENCOUNTER — Telehealth (HOSPITAL_COMMUNITY): Payer: Self-pay | Admitting: Emergency Medicine

## 2018-01-31 NOTE — Telephone Encounter (Signed)
Called the pt to make sure she understands why we are treating her.  We went back over the medications.  She states that she could not understand the lady that had called her on the telephone to talk with her from the speciality pharmacy.  She knows that she comes in for weekly velcade and that she has her calender that her sister will help her with when she gets the medication (revlimid) to help her with to remember when to take it.  2 weeks on 1 week off.

## 2018-02-01 ENCOUNTER — Other Ambulatory Visit (HOSPITAL_COMMUNITY): Payer: Self-pay

## 2018-02-01 DIAGNOSIS — C9000 Multiple myeloma not having achieved remission: Secondary | ICD-10-CM

## 2018-02-02 ENCOUNTER — Encounter (HOSPITAL_COMMUNITY): Payer: Self-pay

## 2018-02-02 ENCOUNTER — Inpatient Hospital Stay (HOSPITAL_COMMUNITY): Payer: Medicare Other | Attending: Oncology

## 2018-02-02 ENCOUNTER — Inpatient Hospital Stay (HOSPITAL_COMMUNITY): Payer: Medicare Other

## 2018-02-02 VITALS — BP 136/50 | HR 79 | Temp 98.4°F | Resp 20 | Wt 182.6 lb

## 2018-02-02 DIAGNOSIS — Z5112 Encounter for antineoplastic immunotherapy: Secondary | ICD-10-CM | POA: Diagnosis present

## 2018-02-02 DIAGNOSIS — C9000 Multiple myeloma not having achieved remission: Secondary | ICD-10-CM | POA: Insufficient documentation

## 2018-02-02 DIAGNOSIS — D509 Iron deficiency anemia, unspecified: Secondary | ICD-10-CM | POA: Insufficient documentation

## 2018-02-02 LAB — COMPREHENSIVE METABOLIC PANEL
ALBUMIN: 3.9 g/dL (ref 3.5–5.0)
ALK PHOS: 66 U/L (ref 38–126)
ALT: 11 U/L — ABNORMAL LOW (ref 14–54)
ANION GAP: 12 (ref 5–15)
AST: 17 U/L (ref 15–41)
BUN: 22 mg/dL — ABNORMAL HIGH (ref 6–20)
CALCIUM: 9.2 mg/dL (ref 8.9–10.3)
CO2: 22 mmol/L (ref 22–32)
Chloride: 104 mmol/L (ref 101–111)
Creatinine, Ser: 0.89 mg/dL (ref 0.44–1.00)
GFR calc Af Amer: >60 mL/min (ref >60)
GLUCOSE: 129 mg/dL — AB (ref 65–99)
Potassium: 3.8 mmol/L (ref 3.5–5.1)
Sodium: 138 mmol/L (ref 135–145)
Total Bilirubin: 0.6 mg/dL (ref 0.3–1.2)
Total Protein: 7.9 g/dL (ref 6.5–8.1)

## 2018-02-02 LAB — CBC WITH DIFFERENTIAL/PLATELET
BASOS PCT: 0 %
Basophils Absolute: 0 10*3/uL (ref 0.0–0.1)
Eosinophils Absolute: 0 10*3/uL (ref 0.0–0.7)
Eosinophils Relative: 0 %
HCT: 31.1 % — ABNORMAL LOW (ref 36.0–46.0)
HEMOGLOBIN: 10.1 g/dL — AB (ref 12.0–15.0)
LYMPHS PCT: 20 %
Lymphs Abs: 1.7 10*3/uL (ref 0.7–4.0)
MCH: 32.8 pg (ref 26.0–34.0)
MCHC: 32.5 g/dL (ref 30.0–36.0)
MCV: 101 fL — ABNORMAL HIGH (ref 78.0–100.0)
MONO ABS: 0.3 10*3/uL (ref 0.1–1.0)
MONOS PCT: 3 %
NEUTROS ABS: 6.2 10*3/uL (ref 1.7–7.7)
NEUTROS PCT: 77 %
Platelets: 227 10*3/uL (ref 150–400)
RBC: 3.08 MIL/uL — ABNORMAL LOW (ref 3.87–5.11)
RDW: 14.7 % (ref 11.5–15.5)
WBC: 8.2 10*3/uL (ref 4.0–10.5)

## 2018-02-02 MED ORDER — BORTEZOMIB CHEMO SQ INJECTION 3.5 MG (2.5MG/ML)
1.3000 mg/m2 | Freq: Once | INTRAMUSCULAR | Status: AC
  Administered 2018-02-02: 2.5 mg via SUBCUTANEOUS
  Filled 2018-02-02: qty 2.5

## 2018-02-02 MED ORDER — PROCHLORPERAZINE MALEATE 10 MG PO TABS
10.0000 mg | ORAL_TABLET | Freq: Once | ORAL | Status: AC
  Administered 2018-02-02: 10 mg via ORAL

## 2018-02-02 MED ORDER — PROCHLORPERAZINE MALEATE 10 MG PO TABS
ORAL_TABLET | ORAL | Status: AC
  Filled 2018-02-02: qty 1

## 2018-02-02 NOTE — Patient Instructions (Signed)
Cancer Center Discharge Instructions for Patients Receiving Chemotherapy  Today you received the following chemotherapy agents velcade.    If you develop nausea and vomiting that is not controlled by your nausea medication, call the clinic.   BELOW ARE SYMPTOMS THAT SHOULD BE REPORTED IMMEDIATELY:  *FEVER GREATER THAN 100.5 F  *CHILLS WITH OR WITHOUT FEVER  NAUSEA AND VOMITING THAT IS NOT CONTROLLED WITH YOUR NAUSEA MEDICATION  *UNUSUAL SHORTNESS OF BREATH  *UNUSUAL BRUISING OR BLEEDING  TENDERNESS IN MOUTH AND THROAT WITH OR WITHOUT PRESENCE OF ULCERS  *URINARY PROBLEMS  *BOWEL PROBLEMS  UNUSUAL RASH Items with * indicate a potential emergency and should be followed up as soon as possible.  Feel free to call the clinic should you have any questions or concerns. The clinic phone number is [phone].  Please show the CHEMO ALERT CARD at check-in to the Emergency Department and triage nurse.   

## 2018-02-02 NOTE — Progress Notes (Signed)
Reviewed patients dexamethasone directions for clarification.  Patient and family verbalized understanding.  Revlimid to be delivered tomorrow per patient and family.  All questions asked and answered.    Reviewed vitamin b12 shot for today but the patient asked if she could wait until next week with next velcade shot to receive a b12 shot.  Patient tolerated velcade with no complaints voiced.  Injection site clean and dry with no bruising or swelling noted at site.  Band aid applied.  VSs with discharge and hand out information given for revlimid and velcade.  No s/s of distress noted.

## 2018-02-03 LAB — PROTEIN ELECTROPHORESIS, SERUM
A/G Ratio: 1 (ref 0.7–1.7)
ALBUMIN ELP: 3.7 g/dL (ref 2.9–4.4)
Alpha-1-Globulin: 0.2 g/dL (ref 0.0–0.4)
Alpha-2-Globulin: 0.8 g/dL (ref 0.4–1.0)
Beta Globulin: 1.5 g/dL — ABNORMAL HIGH (ref 0.7–1.3)
GAMMA GLOBULIN: 1.3 g/dL (ref 0.4–1.8)
Globulin, Total: 3.7 g/dL (ref 2.2–3.9)
M-Spike, %: 0.3 g/dL — ABNORMAL HIGH
TOTAL PROTEIN ELP: 7.4 g/dL (ref 6.0–8.5)

## 2018-02-03 LAB — BETA 2 MICROGLOBULIN, SERUM: BETA 2 MICROGLOBULIN: 2.4 mg/L (ref 0.6–2.4)

## 2018-02-03 LAB — IGG, IGA, IGM
IGG (IMMUNOGLOBIN G), SERUM: 1217 mg/dL (ref 700–1600)
IgA: 686 mg/dL — ABNORMAL HIGH (ref 64–422)
IgM (Immunoglobulin M), Srm: 50 mg/dL (ref 26–217)

## 2018-02-03 LAB — KAPPA/LAMBDA LIGHT CHAINS
KAPPA FREE LGHT CHN: 1796.7 mg/L — AB (ref 3.3–19.4)
Kappa, lambda light chain ratio: 114.44 — ABNORMAL HIGH (ref 0.26–1.65)
LAMDA FREE LIGHT CHAINS: 15.7 mg/L (ref 5.7–26.3)

## 2018-02-04 LAB — IMMUNOFIXATION ELECTROPHORESIS
IGG (IMMUNOGLOBIN G), SERUM: 1183 mg/dL (ref 700–1600)
IgA: 699 mg/dL — ABNORMAL HIGH (ref 64–422)
IgM (Immunoglobulin M), Srm: 45 mg/dL (ref 26–217)
Total Protein ELP: 7.4 g/dL (ref 6.0–8.5)

## 2018-02-04 LAB — MULTIPLE MYELOMA PANEL, SERUM
ALBUMIN SERPL ELPH-MCNC: 3.8 g/dL (ref 2.9–4.4)
ALPHA 1: 0.2 g/dL (ref 0.0–0.4)
Albumin/Glob SerPl: 1.1 (ref 0.7–1.7)
Alpha2 Glob SerPl Elph-Mcnc: 0.8 g/dL (ref 0.4–1.0)
B-Globulin SerPl Elph-Mcnc: 1.5 g/dL — ABNORMAL HIGH (ref 0.7–1.3)
Gamma Glob SerPl Elph-Mcnc: 1.2 g/dL (ref 0.4–1.8)
Globulin, Total: 3.7 g/dL (ref 2.2–3.9)
IGA: 698 mg/dL — AB (ref 64–422)
IGM (IMMUNOGLOBULIN M), SRM: 48 mg/dL (ref 26–217)
IgG (Immunoglobin G), Serum: 1167 mg/dL (ref 700–1600)
M Protein SerPl Elph-Mcnc: 0.6 g/dL — ABNORMAL HIGH
Total Protein ELP: 7.5 g/dL (ref 6.0–8.5)

## 2018-02-08 ENCOUNTER — Other Ambulatory Visit (HOSPITAL_COMMUNITY): Payer: Self-pay | Admitting: *Deleted

## 2018-02-08 DIAGNOSIS — C9000 Multiple myeloma not having achieved remission: Secondary | ICD-10-CM

## 2018-02-09 ENCOUNTER — Encounter (HOSPITAL_COMMUNITY): Payer: Self-pay

## 2018-02-09 ENCOUNTER — Inpatient Hospital Stay (HOSPITAL_COMMUNITY): Payer: Medicare Other

## 2018-02-09 VITALS — BP 114/50 | HR 71 | Temp 98.4°F | Resp 18 | Wt 183.6 lb

## 2018-02-09 DIAGNOSIS — C9000 Multiple myeloma not having achieved remission: Secondary | ICD-10-CM

## 2018-02-09 DIAGNOSIS — Z5112 Encounter for antineoplastic immunotherapy: Secondary | ICD-10-CM | POA: Diagnosis not present

## 2018-02-09 DIAGNOSIS — E538 Deficiency of other specified B group vitamins: Secondary | ICD-10-CM | POA: Insufficient documentation

## 2018-02-09 LAB — COMPREHENSIVE METABOLIC PANEL
ALT: 13 U/L — ABNORMAL LOW (ref 14–54)
ANION GAP: 12 (ref 5–15)
AST: 19 U/L (ref 15–41)
Albumin: 4 g/dL (ref 3.5–5.0)
Alkaline Phosphatase: 76 U/L (ref 38–126)
BILIRUBIN TOTAL: 0.5 mg/dL (ref 0.3–1.2)
BUN: 15 mg/dL (ref 6–20)
CO2: 25 mmol/L (ref 22–32)
Calcium: 10 mg/dL (ref 8.9–10.3)
Chloride: 99 mmol/L — ABNORMAL LOW (ref 101–111)
Creatinine, Ser: 1.05 mg/dL — ABNORMAL HIGH (ref 0.44–1.00)
GFR calc non Af Amer: 50 mL/min — ABNORMAL LOW (ref >60)
GFR, EST AFRICAN AMERICAN: 58 mL/min — AB (ref >60)
Glucose, Bld: 154 mg/dL — ABNORMAL HIGH (ref 65–99)
Potassium: 3.9 mmol/L (ref 3.5–5.1)
SODIUM: 136 mmol/L (ref 135–145)
TOTAL PROTEIN: 7.6 g/dL (ref 6.5–8.1)

## 2018-02-09 LAB — CBC WITH DIFFERENTIAL/PLATELET
BASOS PCT: 0 %
Basophils Absolute: 0 10*3/uL (ref 0.0–0.1)
EOS ABS: 0 10*3/uL (ref 0.0–0.7)
Eosinophils Relative: 0 %
HEMATOCRIT: 30.3 % — AB (ref 36.0–46.0)
HEMOGLOBIN: 9.9 g/dL — AB (ref 12.0–15.0)
LYMPHS ABS: 0.6 10*3/uL — AB (ref 0.7–4.0)
LYMPHS PCT: 8 %
MCH: 33.4 pg (ref 26.0–34.0)
MCHC: 32.7 g/dL (ref 30.0–36.0)
MCV: 102.4 fL — ABNORMAL HIGH (ref 78.0–100.0)
Monocytes Absolute: 0 10*3/uL — ABNORMAL LOW (ref 0.1–1.0)
Monocytes Relative: 0 %
NEUTROS ABS: 7.5 10*3/uL (ref 1.7–7.7)
Neutrophils Relative %: 92 %
Platelets: 210 10*3/uL (ref 150–400)
RBC: 2.96 MIL/uL — ABNORMAL LOW (ref 3.87–5.11)
RDW: 14.9 % (ref 11.5–15.5)
WBC: 8.1 10*3/uL (ref 4.0–10.5)

## 2018-02-09 MED ORDER — BORTEZOMIB CHEMO SQ INJECTION 3.5 MG (2.5MG/ML)
1.3000 mg/m2 | Freq: Once | INTRAMUSCULAR | Status: AC
  Administered 2018-02-09: 2.5 mg via SUBCUTANEOUS
  Filled 2018-02-09: qty 2.5

## 2018-02-09 MED ORDER — PROCHLORPERAZINE MALEATE 10 MG PO TABS
10.0000 mg | ORAL_TABLET | Freq: Once | ORAL | Status: AC
  Administered 2018-02-09: 10 mg via ORAL
  Filled 2018-02-09: qty 1

## 2018-02-09 MED ORDER — CYANOCOBALAMIN 1000 MCG/ML IJ SOLN
1000.0000 ug | Freq: Once | INTRAMUSCULAR | Status: AC
  Administered 2018-02-09: 1000 ug via INTRAMUSCULAR

## 2018-02-09 MED ORDER — CYANOCOBALAMIN 1000 MCG/ML IJ SOLN
INTRAMUSCULAR | Status: AC
  Filled 2018-02-09: qty 1

## 2018-02-09 NOTE — Progress Notes (Signed)
Brittany Archer tolerated Velcade and Vit B12 injection well without complaints or incident. Labs reviewed prior to administering these injections.Pt continues to take her Revlimid and Decadron as prescribed without issues VSS Pt discharged self ambulatory in satisfactory condition accompanied by family member

## 2018-02-09 NOTE — Patient Instructions (Signed)
[facility] Discharge Instructions for Patients Receiving Chemotherapy   Beginning January 23rd 2017 lab work for the [facility] will be done in the  Main lab at [facility] on 1st floor. If you have a lab appointment with the [facility] please come in thru the  Main Entrance and check in at the main information desk   Today you received the following chemotherapy agents Velcade injection as well as Vit B12 injection . Follow-up as scheduled. Call clinic for any questions or concerns  To help prevent nausea and vomiting after your treatment, we encourage you to take your nausea medication   If you develop nausea and vomiting, or diarrhea that is not controlled by your medication, call the clinic.  The clinic phone number is [phone]. Office hours are Monday-Friday 8:30am-5:00pm.  BELOW ARE SYMPTOMS THAT SHOULD BE REPORTED IMMEDIATELY:  *FEVER GREATER THAN 101.0 F  *CHILLS WITH OR WITHOUT FEVER  NAUSEA AND VOMITING THAT IS NOT CONTROLLED WITH YOUR NAUSEA MEDICATION  *UNUSUAL SHORTNESS OF BREATH  *UNUSUAL BRUISING OR BLEEDING  TENDERNESS IN MOUTH AND THROAT WITH OR WITHOUT PRESENCE OF ULCERS  *URINARY PROBLEMS  *BOWEL PROBLEMS  UNUSUAL RASH Items with * indicate a potential emergency and should be followed up as soon as possible. If you have an emergency after office hours please contact your primary care physician or go to the nearest emergency department.  Please call the clinic during office hours if you have any questions or concerns.   You may also contact the Patient Navigator at [phone] should you have any questions or need assistance in obtaining follow up care.      Resources For Cancer Patients and their Caregivers ? American Cancer Society: Can assist with transportation, wigs, general needs, runs Look Good Feel Better.        845-683-8851 ? Cancer Care: Provides financial assistance, online support groups,  medication/co-pay assistance.  1-800-813-HOPE 7400393988) ? [place] Assists [place] cancer patients and their families through emotional , educational and financial support.  424-542-7289 ? [place] DSS Where to apply for food stamps, Medicaid and utility assistance. 365-591-6498 ? RCATS: Transportation to medical appointments. 770-706-0921 ? Social Security Administration: May apply for disability if have a Stage IV cancer. [phone] ? [employer], Disability and Transit Services: Assists with nutrition, care and transit needs. [phone]

## 2018-02-11 NOTE — Addendum Note (Signed)
Addended by: Creola Corn on: 02/11/2018 05:13 PM   Modules accepted: Orders

## 2018-02-15 ENCOUNTER — Other Ambulatory Visit (HOSPITAL_COMMUNITY): Payer: Self-pay | Admitting: *Deleted

## 2018-02-15 DIAGNOSIS — C9000 Multiple myeloma not having achieved remission: Secondary | ICD-10-CM

## 2018-02-16 ENCOUNTER — Other Ambulatory Visit: Payer: Self-pay

## 2018-02-16 ENCOUNTER — Other Ambulatory Visit (HOSPITAL_COMMUNITY): Payer: Self-pay | Admitting: Emergency Medicine

## 2018-02-16 ENCOUNTER — Inpatient Hospital Stay (HOSPITAL_COMMUNITY): Payer: Medicare Other

## 2018-02-16 ENCOUNTER — Inpatient Hospital Stay (HOSPITAL_BASED_OUTPATIENT_CLINIC_OR_DEPARTMENT_OTHER): Payer: Medicare Other | Admitting: Oncology

## 2018-02-16 ENCOUNTER — Encounter (HOSPITAL_COMMUNITY): Payer: Self-pay | Admitting: Oncology

## 2018-02-16 ENCOUNTER — Encounter: Payer: Self-pay | Admitting: Oncology

## 2018-02-16 ENCOUNTER — Ambulatory Visit (HOSPITAL_COMMUNITY)
Admission: RE | Admit: 2018-02-16 | Discharge: 2018-02-16 | Disposition: A | Discharge status: Home / Self Care | Payer: Medicare Other | Source: Ambulatory Visit | Attending: Internal Medicine | Admitting: Internal Medicine

## 2018-02-16 VITALS — BP 118/53 | HR 69 | Temp 98.3°F | Resp 16 | Wt 184.1 lb

## 2018-02-16 DIAGNOSIS — C9000 Multiple myeloma not having achieved remission: Secondary | ICD-10-CM

## 2018-02-16 DIAGNOSIS — M8588 Other specified disorders of bone density and structure, other site: Secondary | ICD-10-CM | POA: Diagnosis not present

## 2018-02-16 DIAGNOSIS — R05 Cough: Secondary | ICD-10-CM

## 2018-02-16 DIAGNOSIS — Z5112 Encounter for antineoplastic immunotherapy: Secondary | ICD-10-CM | POA: Diagnosis not present

## 2018-02-16 LAB — COMPREHENSIVE METABOLIC PANEL
ALBUMIN: 3.6 g/dL (ref 3.5–5.0)
ALK PHOS: 68 U/L (ref 38–126)
ALT: 14 U/L (ref 14–54)
ANION GAP: 11 (ref 5–15)
AST: 18 U/L (ref 15–41)
BUN: 16 mg/dL (ref 6–20)
CALCIUM: 8.9 mg/dL (ref 8.9–10.3)
CO2: 23 mmol/L (ref 22–32)
Chloride: 102 mmol/L (ref 101–111)
Creatinine, Ser: 0.95 mg/dL (ref 0.44–1.00)
GFR calc Af Amer: >60 mL/min (ref >60)
GFR calc non Af Amer: 56 mL/min — ABNORMAL LOW (ref >60)
GLUCOSE: 83 mg/dL (ref 65–99)
POTASSIUM: 3.6 mmol/L (ref 3.5–5.1)
SODIUM: 136 mmol/L (ref 135–145)
Total Bilirubin: 0.5 mg/dL (ref 0.3–1.2)
Total Protein: 7.1 g/dL (ref 6.5–8.1)

## 2018-02-16 LAB — CBC WITH DIFFERENTIAL/PLATELET
BASOS ABS: 0.1 10*3/uL (ref 0.0–0.1)
Basophils Relative: 1 %
EOS ABS: 0.2 10*3/uL (ref 0.0–0.7)
Eosinophils Relative: 3 %
HEMATOCRIT: 28.2 % — AB (ref 36.0–46.0)
HEMOGLOBIN: 9.2 g/dL — AB (ref 12.0–15.0)
LYMPHS PCT: 19 %
Lymphs Abs: 1.2 10*3/uL (ref 0.7–4.0)
MCH: 33.3 pg (ref 26.0–34.0)
MCHC: 32.6 g/dL (ref 30.0–36.0)
MCV: 102.2 fL — ABNORMAL HIGH (ref 78.0–100.0)
Monocytes Absolute: 0.5 10*3/uL (ref 0.1–1.0)
Monocytes Relative: 8 %
NEUTROS ABS: 4.5 10*3/uL (ref 1.7–7.7)
NEUTROS PCT: 69 %
Platelets: 188 10*3/uL (ref 150–400)
RBC: 2.76 MIL/uL — AB (ref 3.87–5.11)
RDW: 14.9 % (ref 11.5–15.5)
WBC: 6.5 10*3/uL (ref 4.0–10.5)

## 2018-02-16 MED ORDER — BORTEZOMIB CHEMO SQ INJECTION 3.5 MG (2.5MG/ML)
1.3000 mg/m2 | Freq: Once | INTRAMUSCULAR | Status: AC
  Administered 2018-02-16: 2.5 mg via SUBCUTANEOUS
  Filled 2018-02-16: qty 2.5

## 2018-02-16 MED ORDER — PROCHLORPERAZINE MALEATE 10 MG PO TABS: 10.0000 mg | ORAL_TABLET | Freq: Once | ORAL | Status: DC

## 2018-02-16 MED ORDER — PROCHLORPERAZINE MALEATE 10 MG PO TABS
ORAL_TABLET | ORAL | Status: AC
  Filled 2018-02-16: qty 1

## 2018-02-16 MED ORDER — LENALIDOMIDE 25 MG PO CAPS: ORAL_CAPSULE | ORAL | 0 refills | Status: DC

## 2018-02-16 NOTE — Patient Instructions (Signed)
[facility] Discharge Instructions for Patients Receiving Chemotherapy   Beginning January 23rd 2017 lab work for the Cancer Center will be done in the  Main lab at [facility] on 1st floor. If you have a lab appointment with the Cancer Center please come in thru the  Main Entrance and check in at the main information desk   Today you received the following chemotherapy agents   To help prevent nausea and vomiting after your treatment, we encourage you to take your nausea medication     If you develop nausea and vomiting, or diarrhea that is not controlled by your medication, call the clinic.  The clinic phone number is [phone]. Office hours are Monday-Friday 8:30am-5:00pm.  BELOW ARE SYMPTOMS THAT SHOULD BE REPORTED IMMEDIATELY:  *FEVER GREATER THAN 101.0 F  *CHILLS WITH OR WITHOUT FEVER  NAUSEA AND VOMITING THAT IS NOT CONTROLLED WITH YOUR NAUSEA MEDICATION  *UNUSUAL SHORTNESS OF BREATH  *UNUSUAL BRUISING OR BLEEDING  TENDERNESS IN MOUTH AND THROAT WITH OR WITHOUT PRESENCE OF ULCERS  *URINARY PROBLEMS  *BOWEL PROBLEMS  UNUSUAL RASH Items with * indicate a potential emergency and should be followed up as soon as possible. If you have an emergency after office hours please contact your primary care physician or go to the nearest emergency department.  Please call the clinic during office hours if you have any questions or concerns.   You may also contact the Patient Navigator at [phone] should you have any questions or need assistance in obtaining follow up care.      Resources For Cancer Patients and their Caregivers ? American Cancer Society: Can assist with transportation, wigs, general needs, runs Look Good Feel Better.        1-888-227-6333 ? Cancer Care: Provides financial assistance, online support groups, medication/co-pay assistance.  1-800-813-HOPE (4673) ? [facility] Assists [place] cancer  patients and their families through emotional , educational and financial support.  336-427-4357 ? [place] DSS Where to apply for food stamps, Medicaid and utility assistance. 336-342-1394 ? RCATS: Transportation to medical appointments. 336-347-2287 ? Social Security Administration: May apply for disability if have a Stage IV cancer. [phone] 1-800-772-1213 ? [place] [employer], Disability and Transit Services: Assists with nutrition, care and transit needs. [phone]         

## 2018-02-16 NOTE — Progress Notes (Signed)
Patient is taking Revlimid and dexamethasone and has not missed any doses and reports no side effects at this time.

## 2018-02-16 NOTE — Progress Notes (Signed)
Brittany Archer presents today for injection per MD orders. Velcade 2.5 mg administered SQ in left Abdomen. Administration without incident. Patient tolerated well.

## 2018-02-16 NOTE — Patient Instructions (Signed)
[facility] at [facility] Discharge Instructions  RECOMMENDATIONS MADE BY THE CONSULTANT AND ANY TEST RESULTS WILL BE SENT TO YOUR REFERRING PHYSICIAN.  You were seen today by Jenny Burns, NP  Thank you for choosing [facility] at [facility] to provide your oncology and hematology care.  To afford each patient quality time with our provider, please arrive at least 15 minutes before your scheduled appointment time.    If you have a lab appointment with the Cancer Center please come in thru the  Main Entrance and check in at the main information desk  You need to re-schedule your appointment should you arrive 10 or more minutes late.  We strive to give you quality time with our providers, and arriving late affects you and other patients whose appointments are after yours.  Also, if you no show three or more times for appointments you may be dismissed from the clinic at the providers discretion.     Again, thank you for choosing [facility].  Our hope is that these requests will decrease the amount of time that you wait before being seen by our physicians.       _____________________________________________________________  Should you have questions after your visit to [facility], please contact our office at [phone] between the hours of 8:30 a.m. and 4:30 p.m.  Voicemails left after 4:30 p.m. will not be returned until the following business day.  For prescription refill requests, have your pharmacy contact our office.       Resources For Cancer Patients and their Caregivers ? American Cancer Society: Can assist with transportation, wigs, general needs, runs Look Good Feel Better.        1-888-227-6333 ? Cancer Care: Provides financial assistance, online support groups, medication/co-pay assistance.  1-800-813-HOPE (4673) ? [facility] Assists [place] cancer patients and their  families through emotional , educational and financial support.  336-427-4357 ? [place] DSS Where to apply for food stamps, Medicaid and utility assistance. 336-342-1394 ? RCATS: Transportation to medical appointments. 336-347-2287 ? Social Security Administration: May apply for disability if have a Stage IV cancer. 336-342-7796 1-800-772-1213 ? [place] Aging, Disability and Transit Services: Assists with nutrition, care and transit needs. 336-349-2343  Cancer Center Support Programs: [email or web address]@ > Cancer Support Group  2nd Tuesday of the month 1pm-2pm, Journey Room  > Creative Journey  3rd Tuesday of the month 1130am-1pm, Journey Room  > Look Good Feel Better  1st Wednesday of the month 10am-12 noon, Journey Room (Call American Cancer Society to register 1-800-395-5775)    

## 2018-02-16 NOTE — Progress Notes (Signed)
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  HPI: Patient was seen initially for anemia. Workup for anemia showed an IgM monoclonal protein in the serum.  Therefore a bone marrow biopsy was ordered which was performed recently and showed 60% infiltration by plasma cells Two different clones were detected 1 clone had normal karyotype by FISH the other Clone had multiple hyperdiploid cells.   Labs showed intermittent mild increase in serum creatinine today is at 1.03 mild hypercalcemia ranging between 10.1 and 10.5.  Ferritin was 416. Serum protein electrophoresis from November 2018 showed an increase in IgA to 897 with normal IgG and IgM M protein was measured at 0.9 but beta globulin was also increased at 1.7.  Immunofixation confirmed this to be IgA kappa protein.  Serum erythropoietin level was elevated at 18.7 vitamin B12 was 164 serum kappa light chains were 2048 and kappa light lambda light chains were 16.6 with a ratio 123. And chronic anemia hemoglobin is stable at 9. Recent iron panel did not show iron deficiency.  Per Dr. Annie Paras note from 01/20/2018:   "I recommended triple regimen with Revlimid and bortezomib with Decadron.  Given her diabetes I would do a low-dose( 18m weekly) Decadron.  Potential side effects particularly with diabetic neuropathy there is increased risk of worsening neuropathy with Velcade.  We will offer subcutaneous Velcade weekly. Risk of cytopenias, myelosuppression and risk of infections from neutropenia, fatigue from anemia and bleeding or easy bruising from thrombocytopenia have been explained.  She will be at high risk for varicella-zoster infection and DVT. She will need herpes zoster prophylaxis. We will begin aspirin 81 mg a day to reduce her risk of thrombosis."  Interval History: Patient presents today for cycle 2 day 1 of Revlimid/Velcade/Decadron. States her appetite is 25% and energy level to  75%. She complains of no pain today. Patient feels she is tolerating medications well. Patient states her appetite appears to be improving. She noticed she has gained some weight. Patient's sister states her weakness has improved.  She is trying to eat a balanced diet. She is drinking  Fluids. She admits to a dry hacking cough for the past week. She denies any sputum production. She admits to trying to remain active. She denies any recent illnesses or fevers. She denies shortness of breath, chest pain, abdominal pain, constipation, diarrhea, dizziness or nausea/vomiting.  Review of Systems  Constitutional: Negative.  Negative for chills, fever, malaise/fatigue and weight loss.  HENT: Negative for congestion and ear pain.   Eyes: Negative.  Negative for blurred vision and double vision.  Respiratory: Positive for cough. Negative for sputum production and shortness of breath.   Cardiovascular: Negative.  Negative for chest pain, palpitations and leg swelling.  Gastrointestinal: Negative.  Negative for abdominal pain, constipation, diarrhea, nausea and vomiting.  Genitourinary: Negative for dysuria, frequency and urgency.  Musculoskeletal: Negative for back pain and falls.  Skin: Negative.  Negative for rash.  Neurological: Negative.  Negative for weakness and headaches.  Endo/Heme/Allergies: Negative.  Does not bruise/bleed easily.  Psychiatric/Behavioral: Negative.  Negative for depression. The patient is not nervous/anxious and does not have insomnia.    Physical Exam  Constitutional: She is oriented to person, place, and time and well-developed, well-nourished, and in no distress. Vital signs are normal.  HENT:  Head: Normocephalic and atraumatic.  Eyes: Pupils are equal, round, and reactive to light.  Neck: Normal range of motion.  Cardiovascular: Normal rate, regular rhythm and normal  heart sounds.  No murmur heard. Pulmonary/Chest: Effort normal and breath sounds normal. She has no  wheezes.  Abdominal: Soft. Normal appearance and bowel sounds are normal. She exhibits no distension. There is no tenderness.  Musculoskeletal: Normal range of motion. She exhibits no edema.  Neurological: She is alert and oriented to person, place, and time. Gait normal.  Skin: Skin is warm and dry. No rash noted.  Psychiatric: Mood, memory, affect and judgment normal.   Assessment and plan: Continue with Revlimid/Velcade/Decadron as indicated by Dr. [place]. Patient is here for cycle 2 day 1. She is tolerating Revlimid/Velcade for/Decadron well. She is in need any refill on Revlimid and Decadron. Additionally she continues her acyclovir as prescribed.  - Labs from today are stable and okay for treatment. Hemoglobin slowly trending down and is today 9.2. Previously 9.9. Reiterated side effects of medications including cytopenias, myelosuppression and risk of infection from neutropenia, fatigue from anemia and bleeding or easy bruising from thrombocytopenia. We'll continue to monitor these counts closely. - Per Dr. Annie Paras last note patient needs 24-hour urine collection and bone scan to complete work-up. Patient states she will have bone scan completed today. RN presented patient with 24-hour urine jug and went over instructions. Patient denies any questions. - RTC in one week for Velcade and to initiate Xgeva for bone strengthening. - RTC in two weeks for Velcade, labs and MD assessment.  - Return to clinic monthly for vitamin B12 injections.  Greater than 50% was spent in counseling and coordination of care with this patient including but not limited to discussion of the relevant topics above (See A&P) including, but not limited to diagnosis and management of acute and chronic medical conditions.   Faythe Casa, NP 02/16/2018 1:29 PM

## 2018-02-16 NOTE — Progress Notes (Signed)
revlimid refilled

## 2018-02-18 ENCOUNTER — Other Ambulatory Visit (HOSPITAL_COMMUNITY)
Admission: RE | Admit: 2018-02-18 | Discharge: 2018-02-18 | Disposition: A | Discharge status: Home / Self Care | Payer: Medicare Other | Source: Ambulatory Visit | Attending: Internal Medicine | Admitting: Internal Medicine

## 2018-02-18 DIAGNOSIS — C9000 Multiple myeloma not having achieved remission: Secondary | ICD-10-CM | POA: Diagnosis present

## 2018-02-18 LAB — PROTEIN, URINE, 24 HOUR
Collection Interval-UPROT: 24 hours
PROTEIN, 24H URINE: 125 mg/d — AB (ref 50–100)
Urine Total Volume-UPROT: 500 mL

## 2018-02-23 ENCOUNTER — Other Ambulatory Visit: Payer: Self-pay

## 2018-02-23 ENCOUNTER — Encounter (HOSPITAL_COMMUNITY): Payer: Self-pay

## 2018-02-23 ENCOUNTER — Inpatient Hospital Stay (HOSPITAL_COMMUNITY): Payer: Medicare Other

## 2018-02-23 DIAGNOSIS — C9000 Multiple myeloma not having achieved remission: Secondary | ICD-10-CM

## 2018-02-23 DIAGNOSIS — Z5112 Encounter for antineoplastic immunotherapy: Secondary | ICD-10-CM | POA: Diagnosis not present

## 2018-02-23 LAB — CBC WITH DIFFERENTIAL/PLATELET
Basophils Absolute: 0 10*3/uL (ref 0.0–0.1)
Basophils Relative: 1 %
Eosinophils Absolute: 0.1 10*3/uL (ref 0.0–0.7)
Eosinophils Relative: 3 %
HEMATOCRIT: 27.2 % — AB (ref 36.0–46.0)
HEMOGLOBIN: 8.8 g/dL — AB (ref 12.0–15.0)
LYMPHS PCT: 34 %
Lymphs Abs: 1.3 10*3/uL (ref 0.7–4.0)
MCH: 33.2 pg (ref 26.0–34.0)
MCHC: 32.4 g/dL (ref 30.0–36.0)
MCV: 102.6 fL — ABNORMAL HIGH (ref 78.0–100.0)
MONOS PCT: 21 %
Monocytes Absolute: 0.8 10*3/uL (ref 0.1–1.0)
NEUTROS PCT: 41 %
Neutro Abs: 1.5 10*3/uL — ABNORMAL LOW (ref 1.7–7.7)
Platelets: 244 10*3/uL (ref 150–400)
RBC: 2.65 MIL/uL — AB (ref 3.87–5.11)
RDW: 14.8 % (ref 11.5–15.5)
WBC: 3.7 10*3/uL — AB (ref 4.0–10.5)

## 2018-02-23 LAB — COMPREHENSIVE METABOLIC PANEL
ALBUMIN: 3.1 g/dL — AB (ref 3.5–5.0)
ALT: 11 U/L — AB (ref 14–54)
AST: 13 U/L — AB (ref 15–41)
Alkaline Phosphatase: 67 U/L (ref 38–126)
Anion gap: 10 (ref 5–15)
BILIRUBIN TOTAL: 0.4 mg/dL (ref 0.3–1.2)
BUN: 15 mg/dL (ref 6–20)
CO2: 26 mmol/L (ref 22–32)
CREATININE: 1.09 mg/dL — AB (ref 0.44–1.00)
Calcium: 9.2 mg/dL (ref 8.9–10.3)
Chloride: 100 mmol/L — ABNORMAL LOW (ref 101–111)
GFR calc Af Amer: 55 mL/min — ABNORMAL LOW (ref >60)
GFR, EST NON AFRICAN AMERICAN: 48 mL/min — AB (ref >60)
GLUCOSE: 126 mg/dL — AB (ref 65–99)
Potassium: 3.5 mmol/L (ref 3.5–5.1)
Sodium: 136 mmol/L (ref 135–145)
TOTAL PROTEIN: 7.2 g/dL (ref 6.5–8.1)

## 2018-02-23 LAB — LACTATE DEHYDROGENASE: LDH: 110 U/L (ref 98–192)

## 2018-02-23 MED ORDER — BORTEZOMIB CHEMO SQ INJECTION 3.5 MG (2.5MG/ML)
1.3000 mg/m2 | Freq: Once | INTRAMUSCULAR | Status: AC
  Administered 2018-02-23: 2.5 mg via SUBCUTANEOUS
  Filled 2018-02-23: qty 2.5

## 2018-02-23 MED ORDER — DENOSUMAB 120 MG/1.7ML ~~LOC~~ SOLN
120.0000 mg | Freq: Once | SUBCUTANEOUS | Status: AC
  Administered 2018-02-23: 120 mg via SUBCUTANEOUS
  Filled 2018-02-23: qty 1.7

## 2018-02-23 MED ORDER — PROCHLORPERAZINE MALEATE 10 MG PO TABS: 10.0000 mg | ORAL_TABLET | Freq: Once | ORAL | Status: DC

## 2018-02-23 NOTE — Patient Instructions (Signed)
[facility] Discharge Instructions for Patients Receiving Chemotherapy   Beginning January 23rd 2017 lab work for the Cancer Center will be done in the  Main lab at [place] on 1st floor. If you have a lab appointment with the Cancer Center please come in thru the  Main Entrance and check in at the main information desk   Today you received the following chemotherapy agents   To help prevent nausea and vomiting after your treatment, we encourage you to take your nausea medication     If you develop nausea and vomiting, or diarrhea that is not controlled by your medication, call the clinic.  The clinic phone number is [phone]. Office hours are Monday-Friday 8:30am-5:00pm.  BELOW ARE SYMPTOMS THAT SHOULD BE REPORTED IMMEDIATELY:  *FEVER GREATER THAN 101.0 F  *CHILLS WITH OR WITHOUT FEVER  NAUSEA AND VOMITING THAT IS NOT CONTROLLED WITH YOUR NAUSEA MEDICATION  *UNUSUAL SHORTNESS OF BREATH  *UNUSUAL BRUISING OR BLEEDING  TENDERNESS IN MOUTH AND THROAT WITH OR WITHOUT PRESENCE OF ULCERS  *URINARY PROBLEMS  *BOWEL PROBLEMS  UNUSUAL RASH Items with * indicate a potential emergency and should be followed up as soon as possible. If you have an emergency after office hours please contact your primary care physician or go to the nearest emergency department.  Please call the clinic during office hours if you have any questions or concerns.   You may also contact the Patient Navigator at [phone] should you have any questions or need assistance in obtaining follow up care.      Resources For Cancer Patients and their Caregivers ? American Cancer Society: Can assist with transportation, wigs, general needs, runs Look Good Feel Better.        1-888-227-6333 ? Cancer Care: Provides financial assistance, online support groups, medication/co-pay assistance.  1-800-813-HOPE (4673) ? [facility] Assists [place] cancer  patients and their families through emotional , educational and financial support.  336-427-4357 ? [place] DSS Where to apply for food stamps, Medicaid and utility assistance. 336-342-1394 ? RCATS: Transportation to medical appointments. 336-347-2287 ? Social Security Administration: May apply for disability if have a Stage IV cancer. 336-342-7796 1-800-772-1213 ? [place] Aging, Disability and Transit Services: Assists with nutrition, care and transit needs. 336-349-2343         

## 2018-02-23 NOTE — Progress Notes (Signed)
Labs reviewed with md. Proceed with treatment.  Patient is taking Revlamid and has not missed any doses and reports no side effects at this time.   Chelle Cayton Moseley presents today for injection per MD orders. Velcade  2.5 mg administered SQ in left Abdomen. Administration without incident. Patient tolerated well.  Ieasha Boerema Washko presents today for injection per MD orders. xgeva 120 mg  administered SQ in right Abdomen. Administration without incident. Patient tolerated well.  Treatment given per orders. Patient tolerated it well without problems. Vitals stable and discharged home from clinic ambulatory. Follow up as scheduled.

## 2018-03-02 ENCOUNTER — Inpatient Hospital Stay (HOSPITAL_COMMUNITY): Payer: Medicare Other | Attending: Oncology

## 2018-03-02 ENCOUNTER — Encounter (HOSPITAL_COMMUNITY): Payer: Self-pay

## 2018-03-02 ENCOUNTER — Inpatient Hospital Stay (HOSPITAL_COMMUNITY): Payer: Medicare Other

## 2018-03-02 VITALS — BP 121/45 | HR 75 | Temp 98.2°F | Resp 18 | Wt 178.6 lb

## 2018-03-02 DIAGNOSIS — Z5112 Encounter for antineoplastic immunotherapy: Secondary | ICD-10-CM | POA: Insufficient documentation

## 2018-03-02 DIAGNOSIS — E538 Deficiency of other specified B group vitamins: Secondary | ICD-10-CM | POA: Diagnosis not present

## 2018-03-02 DIAGNOSIS — C9000 Multiple myeloma not having achieved remission: Secondary | ICD-10-CM | POA: Diagnosis present

## 2018-03-02 LAB — COMPREHENSIVE METABOLIC PANEL
ALT: 11 U/L — ABNORMAL LOW (ref 14–54)
AST: 13 U/L — AB (ref 15–41)
Albumin: 3.3 g/dL — ABNORMAL LOW (ref 3.5–5.0)
Alkaline Phosphatase: 77 U/L (ref 38–126)
Anion gap: 13 (ref 5–15)
BILIRUBIN TOTAL: 0.7 mg/dL (ref 0.3–1.2)
BUN: 14 mg/dL (ref 6–20)
CHLORIDE: 97 mmol/L — AB (ref 101–111)
CO2: 26 mmol/L (ref 22–32)
Calcium: 9.7 mg/dL (ref 8.9–10.3)
Creatinine, Ser: 1.03 mg/dL — ABNORMAL HIGH (ref 0.44–1.00)
GFR, EST AFRICAN AMERICAN: 59 mL/min — AB (ref >60)
GFR, EST NON AFRICAN AMERICAN: 51 mL/min — AB (ref >60)
Glucose, Bld: 82 mg/dL (ref 65–99)
POTASSIUM: 3.8 mmol/L (ref 3.5–5.1)
Sodium: 136 mmol/L (ref 135–145)
Total Protein: 7.9 g/dL (ref 6.5–8.1)

## 2018-03-02 LAB — CBC WITH DIFFERENTIAL/PLATELET
BASOS PCT: 1 %
Basophils Absolute: 0.1 10*3/uL (ref 0.0–0.1)
EOS PCT: 3 %
Eosinophils Absolute: 0.2 10*3/uL (ref 0.0–0.7)
HEMATOCRIT: 29.4 % — AB (ref 36.0–46.0)
Hemoglobin: 9.4 g/dL — ABNORMAL LOW (ref 12.0–15.0)
LYMPHS ABS: 1.2 10*3/uL (ref 0.7–4.0)
Lymphocytes Relative: 23 %
MCH: 32.9 pg (ref 26.0–34.0)
MCHC: 32 g/dL (ref 30.0–36.0)
MCV: 102.8 fL — AB (ref 78.0–100.0)
MONO ABS: 0.2 10*3/uL (ref 0.1–1.0)
MONOS PCT: 4 %
NEUTROS ABS: 3.5 10*3/uL (ref 1.7–7.7)
Neutrophils Relative %: 69 %
PLATELETS: 288 10*3/uL (ref 150–400)
RBC: 2.86 MIL/uL — ABNORMAL LOW (ref 3.87–5.11)
RDW: 14.8 % (ref 11.5–15.5)
WBC: 5.2 10*3/uL (ref 4.0–10.5)

## 2018-03-02 LAB — UIFE/LIGHT CHAINS/TP QN, 24-HR UR
% BETA, URINE: 19.1 %
ALPHA 1 URINE: 2 %
ALPHA 2 UR: 6.6 %
Albumin, U: 55.2 %
FREE KAPPA LT CHAINS, UR: 126 mg/L — AB (ref 1.35–24.19)
FREE LAMBDA LT CHAINS, UR: 10.5 mg/L — AB (ref 0.24–6.66)
Free Kappa/Lambda Ratio: 12 — ABNORMAL HIGH (ref 2.04–10.37)
GAMMA GLOBULIN URINE: 17.1 %
TOTAL VOLUME: 500
Total Protein, Urine: 32.4 mg/dL

## 2018-03-02 LAB — LACTATE DEHYDROGENASE: LDH: 128 U/L (ref 98–192)

## 2018-03-02 MED ORDER — PROCHLORPERAZINE MALEATE 10 MG PO TABS
10.0000 mg | ORAL_TABLET | Freq: Once | ORAL | Status: AC
  Administered 2018-03-02: 10 mg via ORAL
  Filled 2018-03-02: qty 1

## 2018-03-02 MED ORDER — BORTEZOMIB CHEMO SQ INJECTION 3.5 MG (2.5MG/ML)
1.3000 mg/m2 | Freq: Once | INTRAMUSCULAR | Status: AC
  Administered 2018-03-02: 2.5 mg via SUBCUTANEOUS
  Filled 2018-03-02: qty 2.5

## 2018-03-02 NOTE — Progress Notes (Signed)
Brittany Archer tolerated Velcade injection well without complaints or incident. Labs reviewed prior to administering this medication. VSS Pt discharged self ambulatory in satisfactory condition accompanied by family member

## 2018-03-02 NOTE — Patient Instructions (Signed)
[facility] Discharge Instructions for Patients Receiving Chemotherapy   Beginning January 23rd 2017 lab work for the Cancer Center will be done in the  Main lab at [place] on 1st floor. If you have a lab appointment with the Cancer Center please come in thru the  Main Entrance and check in at the main information desk   Today you received the following chemotherapy agents Velcade injection. Follow-up as scheduled. Call clinic for any questions or concerns  To help prevent nausea and vomiting after your treatment, we encourage you to take your nausea medication   If you develop nausea and vomiting, or diarrhea that is not controlled by your medication, call the clinic.  The clinic phone number is [phone]. Office hours are Monday-Friday 8:30am-5:00pm.  BELOW ARE SYMPTOMS THAT SHOULD BE REPORTED IMMEDIATELY:  *FEVER GREATER THAN 101.0 F  *CHILLS WITH OR WITHOUT FEVER  NAUSEA AND VOMITING THAT IS NOT CONTROLLED WITH YOUR NAUSEA MEDICATION  *UNUSUAL SHORTNESS OF BREATH  *UNUSUAL BRUISING OR BLEEDING  TENDERNESS IN MOUTH AND THROAT WITH OR WITHOUT PRESENCE OF ULCERS  *URINARY PROBLEMS  *BOWEL PROBLEMS  UNUSUAL RASH Items with * indicate a potential emergency and should be followed up as soon as possible. If you have an emergency after office hours please contact your primary care physician or go to the nearest emergency department.  Please call the clinic during office hours if you have any questions or concerns.   You may also contact the Patient Navigator at [phone] should you have any questions or need assistance in obtaining follow up care.      Resources For Cancer Patients and their Caregivers ? American Cancer Society: Can assist with transportation, wigs, general needs, runs Look Good Feel Better.        1-888-227-6333 ? Cancer Care: Provides financial assistance, online support groups, medication/co-pay assistance.   1-800-813-HOPE (4673) ? [facility] Assists [place] cancer patients and their families through emotional , educational and financial support.  336-427-4357 ? [place] DSS Where to apply for food stamps, Medicaid and utility assistance. 336-342-1394 ? RCATS: Transportation to medical appointments. 336-347-2287 ? Social Security Administration: May apply for disability if have a Stage IV cancer. 336-342-7796 1-800-772-1213 ? [place] Aging, Disability and Transit Services: Assists with nutrition, care and transit needs. 336-349-2343         

## 2018-03-03 LAB — IGG, IGA, IGM
IgA: 410 mg/dL (ref 64–422)
IgG (Immunoglobin G), Serum: 1361 mg/dL (ref 700–1600)
IgM (Immunoglobulin M), Srm: 60 mg/dL (ref 26–217)

## 2018-03-03 LAB — KAPPA/LAMBDA LIGHT CHAINS
Kappa free light chain: 102.5 mg/L — ABNORMAL HIGH (ref 3.3–19.4)
Kappa, lambda light chain ratio: 2.59 — ABNORMAL HIGH (ref 0.26–1.65)
Lambda free light chains: 39.6 mg/L — ABNORMAL HIGH (ref 5.7–26.3)

## 2018-03-03 LAB — BETA 2 MICROGLOBULIN, SERUM: Beta-2 Microglobulin: 3 mg/L — ABNORMAL HIGH (ref 0.6–2.4)

## 2018-03-07 LAB — MULTIPLE MYELOMA PANEL, SERUM
ALBUMIN SERPL ELPH-MCNC: 3.2 g/dL (ref 2.9–4.4)
ALPHA 1: 0.2 g/dL (ref 0.0–0.4)
Albumin/Glob SerPl: 0.9 (ref 0.7–1.7)
Alpha2 Glob SerPl Elph-Mcnc: 1 g/dL (ref 0.4–1.0)
B-Globulin SerPl Elph-Mcnc: 1.1 g/dL (ref 0.7–1.3)
GAMMA GLOB SERPL ELPH-MCNC: 1.3 g/dL (ref 0.4–1.8)
GLOBULIN, TOTAL: 3.7 g/dL (ref 2.2–3.9)
IGA: 421 mg/dL (ref 64–422)
IGM (IMMUNOGLOBULIN M), SRM: 61 mg/dL (ref 26–217)
IgG (Immunoglobin G), Serum: 1406 mg/dL (ref 700–1600)
M Protein SerPl Elph-Mcnc: 0.3 g/dL — ABNORMAL HIGH
Total Protein ELP: 6.9 g/dL (ref 6.0–8.5)

## 2018-03-09 ENCOUNTER — Ambulatory Visit (HOSPITAL_COMMUNITY): Payer: Medicare Other | Admitting: Internal Medicine

## 2018-03-09 ENCOUNTER — Encounter (HOSPITAL_COMMUNITY): Payer: Self-pay | Admitting: Internal Medicine

## 2018-03-09 ENCOUNTER — Inpatient Hospital Stay (HOSPITAL_BASED_OUTPATIENT_CLINIC_OR_DEPARTMENT_OTHER): Payer: Medicare Other | Admitting: Internal Medicine

## 2018-03-09 ENCOUNTER — Inpatient Hospital Stay (HOSPITAL_COMMUNITY): Payer: Medicare Other

## 2018-03-09 VITALS — BP 121/49 | HR 75 | Temp 97.2°F | Resp 16 | Wt 179.2 lb

## 2018-03-09 DIAGNOSIS — Z7982 Long term (current) use of aspirin: Secondary | ICD-10-CM

## 2018-03-09 DIAGNOSIS — Z5112 Encounter for antineoplastic immunotherapy: Secondary | ICD-10-CM | POA: Diagnosis not present

## 2018-03-09 DIAGNOSIS — C9000 Multiple myeloma not having achieved remission: Secondary | ICD-10-CM

## 2018-03-09 DIAGNOSIS — M8580 Other specified disorders of bone density and structure, unspecified site: Secondary | ICD-10-CM | POA: Diagnosis not present

## 2018-03-09 DIAGNOSIS — D509 Iron deficiency anemia, unspecified: Secondary | ICD-10-CM

## 2018-03-09 DIAGNOSIS — E538 Deficiency of other specified B group vitamins: Secondary | ICD-10-CM

## 2018-03-09 LAB — CBC WITH DIFFERENTIAL/PLATELET
BASOS ABS: 0 10*3/uL (ref 0.0–0.1)
Basophils Relative: 1 %
EOS ABS: 0.1 10*3/uL (ref 0.0–0.7)
Eosinophils Relative: 3 %
HEMATOCRIT: 28 % — AB (ref 36.0–46.0)
Hemoglobin: 9.1 g/dL — ABNORMAL LOW (ref 12.0–15.0)
LYMPHS PCT: 28 %
Lymphs Abs: 1.1 10*3/uL (ref 0.7–4.0)
MCH: 33.6 pg (ref 26.0–34.0)
MCHC: 32.5 g/dL (ref 30.0–36.0)
MCV: 103.3 fL — ABNORMAL HIGH (ref 78.0–100.0)
MONOS PCT: 7 %
Monocytes Absolute: 0.3 10*3/uL (ref 0.1–1.0)
Neutro Abs: 2.4 10*3/uL (ref 1.7–7.7)
Neutrophils Relative %: 61 %
Platelets: 204 10*3/uL (ref 150–400)
RBC: 2.71 MIL/uL — AB (ref 3.87–5.11)
RDW: 15 % (ref 11.5–15.5)
WBC: 3.9 10*3/uL — AB (ref 4.0–10.5)

## 2018-03-09 LAB — COMPREHENSIVE METABOLIC PANEL
ALBUMIN: 3.4 g/dL — AB (ref 3.5–5.0)
ALT: 12 U/L — ABNORMAL LOW (ref 14–54)
ANION GAP: 10 (ref 5–15)
AST: 18 U/L (ref 15–41)
Alkaline Phosphatase: 75 U/L (ref 38–126)
BILIRUBIN TOTAL: 0.5 mg/dL (ref 0.3–1.2)
BUN: 18 mg/dL (ref 6–20)
CHLORIDE: 103 mmol/L (ref 101–111)
CO2: 26 mmol/L (ref 22–32)
Calcium: 9 mg/dL (ref 8.9–10.3)
Creatinine, Ser: 0.96 mg/dL (ref 0.44–1.00)
GFR calc Af Amer: >60 mL/min (ref >60)
GFR calc non Af Amer: 56 mL/min — ABNORMAL LOW (ref >60)
GLUCOSE: 82 mg/dL (ref 65–99)
Potassium: 3.1 mmol/L — ABNORMAL LOW (ref 3.5–5.1)
SODIUM: 139 mmol/L (ref 135–145)
Total Protein: 7.3 g/dL (ref 6.5–8.1)

## 2018-03-09 LAB — LACTATE DEHYDROGENASE: LDH: 137 U/L (ref 98–192)

## 2018-03-09 MED ORDER — LENALIDOMIDE 25 MG PO CAPS: ORAL_CAPSULE | ORAL | 0 refills | Status: DC

## 2018-03-09 MED ORDER — PROCHLORPERAZINE MALEATE 10 MG PO TABS
ORAL_TABLET | ORAL | Status: AC
  Filled 2018-03-09: qty 1

## 2018-03-09 MED ORDER — BORTEZOMIB CHEMO SQ INJECTION 3.5 MG (2.5MG/ML)
1.3000 mg/m2 | Freq: Once | INTRAMUSCULAR | Status: AC
  Administered 2018-03-09: 2.5 mg via SUBCUTANEOUS
  Filled 2018-03-09: qty 2.5

## 2018-03-09 MED ORDER — PROCHLORPERAZINE MALEATE 10 MG PO TABS
10.0000 mg | ORAL_TABLET | Freq: Once | ORAL | Status: AC
  Administered 2018-03-09: 10 mg via ORAL

## 2018-03-09 MED ORDER — CYANOCOBALAMIN 1000 MCG/ML IJ SOLN
INTRAMUSCULAR | Status: AC
  Filled 2018-03-09: qty 1

## 2018-03-09 MED ORDER — CYANOCOBALAMIN 1000 MCG/ML IJ SOLN
1000.0000 ug | Freq: Once | INTRAMUSCULAR | Status: AC
  Administered 2018-03-09: 1000 ug via INTRAMUSCULAR

## 2018-03-09 NOTE — Patient Instructions (Signed)
[facility] Discharge Instructions for Patients Receiving Chemotherapy   Beginning January 23rd 2017 lab work for the [facility] will be done in the  Main lab at [facility] on 1st floor. If you have a lab appointment with the [facility] please come in thru the  Main Entrance and check in at the main information desk   Today you received the following chemotherapy agents Velcade injection as well as Vit B12 injection. Follow-up as scheduled. Call clinic for any questions or concerns  To help prevent nausea and vomiting after your treatment, we encourage you to take your nausea medication   If you develop nausea and vomiting, or diarrhea that is not controlled by your medication, call the clinic.  The clinic phone number is [phone]. Office hours are Monday-Friday 8:30am-5:00pm.  BELOW ARE SYMPTOMS THAT SHOULD BE REPORTED IMMEDIATELY:  *FEVER GREATER THAN 101.0 F  *CHILLS WITH OR WITHOUT FEVER  NAUSEA AND VOMITING THAT IS NOT CONTROLLED WITH YOUR NAUSEA MEDICATION  *UNUSUAL SHORTNESS OF BREATH  *UNUSUAL BRUISING OR BLEEDING  TENDERNESS IN MOUTH AND THROAT WITH OR WITHOUT PRESENCE OF ULCERS  *URINARY PROBLEMS  *BOWEL PROBLEMS  UNUSUAL RASH Items with * indicate a potential emergency and should be followed up as soon as possible. If you have an emergency after office hours please contact your primary care physician or go to the nearest emergency department.  Please call the clinic during office hours if you have any questions or concerns.   You may also contact the Patient Navigator at [phone] should you have any questions or need assistance in obtaining follow up care.      Resources For Cancer Patients and their Caregivers ? American Cancer Society: Can assist with transportation, wigs, general needs, runs Look Good Feel Better.        (747) 020-2286 ? Cancer Care: Provides financial assistance, online support groups,  medication/co-pay assistance.  1-800-813-HOPE 346-787-3671) ? [facility] Assists [place] cancer patients and their families through emotional , educational and financial support.  (302) 540-6461 ? [place] DSS Where to apply for food stamps, Medicaid and utility assistance. 276-276-6865 ? RCATS: Transportation to medical appointments. 838-373-2873 ? Social Security Administration: May apply for disability if have a Stage IV cancer. (623)190-8159 406-204-6976 ? [employer], Disability and Transit Services: Assists with nutrition, care and transit needs. [phone]

## 2018-03-09 NOTE — Patient Instructions (Signed)
[facility] Discharge Instructions   You were seen today by Dr. Zoila Shutter She went over your recent labs. Dr. Walden Field also discussed your scan results with you as well. We want you to have 4 complete cycles and then get a PET scan to reevaluate and determine if treatment is working. You will get treatment today. We will see you back in 3 weeks for your treatment. We will get you scheduled for your scan, labs and follow up once the scan has been done.   Thank you for choosing [facility] at [facility] to provide your oncology and hematology care.  To afford each patient quality time with our provider, please arrive at least 15 minutes before your scheduled appointment time.    If you have a lab appointment with the [facility] please come in thru the  Main Entrance and check in at the main information desk  You need to re-schedule your appointment should you arrive 10 or more minutes late.  We strive to give you quality time with our providers, and arriving late affects you and other patients whose appointments are after yours.  Also, if you no show three or more times for appointments you may be dismissed from the clinic at the providers discretion.     Again, thank you for choosing [facility].  Our hope is that these requests will decrease the amount of time that you wait before being seen by our physicians.       _____________________________________________________________  Should you have questions after your visit to [facility], please contact our office at [phone] between the hours of 8:30 a.m. and 4:30 p.m.  Voicemails left after 4:30 p.m. will not be returned until the following business day.  For prescription refill requests, have your pharmacy contact our office.       Resources For Cancer Patients and their Caregivers ? American Cancer Society: Can assist with transportation,  wigs, general needs, runs Look Good Feel Better.        (412)534-0147 ? Cancer Care: Provides financial assistance, online support groups, medication/co-pay assistance.  1-800-813-HOPE (470) 370-6375) ? [facility] Assists [place] cancer patients and their families through emotional , educational and financial support.  308 053 8090 ? [place] DSS Where to apply for food stamps, Medicaid and utility assistance. (412)644-9764 ? RCATS: Transportation to medical appointments. 281-787-8177 ? Social Security Administration: May apply for disability if have a Stage IV cancer. (408) 458-0855 319-448-9662 ? [employer], Disability and Transit Services: Assists with nutrition, care and transit needs. [place] Support Programs:   > Cancer Support Group  2nd Tuesday of the month 1pm-2pm, Journey Room   > Creative Journey  3rd Tuesday of the month 1130am-1pm, Journey Room

## 2018-03-09 NOTE — Progress Notes (Signed)
1440 Labs reviewed with and pt seen by Dr. Walden Field and pt approved for Velcade injection today per MD      Brittany Archer tolerated Velcade and Vit B12 injection well without complaints or incident. Pt continues to take her Revlimid as prescribed without any issues. VSS Pt discharged self ambulatory in satisfactory condition accompanied by family member

## 2018-03-30 ENCOUNTER — Inpatient Hospital Stay (HOSPITAL_COMMUNITY): Payer: Medicare Other | Attending: Oncology

## 2018-03-30 ENCOUNTER — Inpatient Hospital Stay (HOSPITAL_COMMUNITY): Payer: Medicare Other

## 2018-03-30 VITALS — BP 128/48 | HR 53 | Temp 98.3°F | Resp 16 | Wt 182.6 lb

## 2018-03-30 DIAGNOSIS — E538 Deficiency of other specified B group vitamins: Secondary | ICD-10-CM | POA: Diagnosis not present

## 2018-03-30 DIAGNOSIS — C9000 Multiple myeloma not having achieved remission: Secondary | ICD-10-CM

## 2018-03-30 DIAGNOSIS — Z5112 Encounter for antineoplastic immunotherapy: Secondary | ICD-10-CM | POA: Diagnosis present

## 2018-03-30 LAB — CBC WITH DIFFERENTIAL/PLATELET
BASOS ABS: 0.1 10*3/uL (ref 0.0–0.1)
Basophils Relative: 2 %
EOS PCT: 8 %
Eosinophils Absolute: 0.3 10*3/uL (ref 0.0–0.7)
HCT: 28 % — ABNORMAL LOW (ref 36.0–46.0)
Hemoglobin: 9.1 g/dL — ABNORMAL LOW (ref 12.0–15.0)
Lymphocytes Relative: 35 %
Lymphs Abs: 1.5 10*3/uL (ref 0.7–4.0)
MCH: 33.1 pg (ref 26.0–34.0)
MCHC: 32.5 g/dL (ref 30.0–36.0)
MCV: 101.8 fL — ABNORMAL HIGH (ref 78.0–100.0)
MONO ABS: 0.4 10*3/uL (ref 0.1–1.0)
Monocytes Relative: 9 %
Neutro Abs: 2 10*3/uL (ref 1.7–7.7)
Neutrophils Relative %: 46 %
PLATELETS: 167 10*3/uL (ref 150–400)
RBC: 2.75 MIL/uL — AB (ref 3.87–5.11)
RDW: 15.1 % (ref 11.5–15.5)
WBC: 4.3 10*3/uL (ref 4.0–10.5)

## 2018-03-30 LAB — COMPREHENSIVE METABOLIC PANEL
ALT: 12 U/L — AB (ref 14–54)
ANION GAP: 12 (ref 5–15)
AST: 16 U/L (ref 15–41)
Albumin: 3.7 g/dL (ref 3.5–5.0)
Alkaline Phosphatase: 85 U/L (ref 38–126)
BILIRUBIN TOTAL: 0.7 mg/dL (ref 0.3–1.2)
BUN: 17 mg/dL (ref 6–20)
CALCIUM: 9.4 mg/dL (ref 8.9–10.3)
CO2: 25 mmol/L (ref 22–32)
Chloride: 102 mmol/L (ref 101–111)
Creatinine, Ser: 1.05 mg/dL — ABNORMAL HIGH (ref 0.44–1.00)
GFR calc Af Amer: 58 mL/min — ABNORMAL LOW (ref >60)
GFR, EST NON AFRICAN AMERICAN: 50 mL/min — AB (ref >60)
Glucose, Bld: 140 mg/dL — ABNORMAL HIGH (ref 65–99)
Potassium: 3 mmol/L — ABNORMAL LOW (ref 3.5–5.1)
Sodium: 139 mmol/L (ref 135–145)
TOTAL PROTEIN: 7.7 g/dL (ref 6.5–8.1)

## 2018-03-30 LAB — LACTATE DEHYDROGENASE: LDH: 149 U/L (ref 98–192)

## 2018-03-30 MED ORDER — PROCHLORPERAZINE MALEATE 10 MG PO TABS
10.0000 mg | ORAL_TABLET | Freq: Once | ORAL | Status: AC
  Administered 2018-03-30: 10 mg via ORAL

## 2018-03-30 MED ORDER — PROCHLORPERAZINE MALEATE 10 MG PO TABS
ORAL_TABLET | ORAL | Status: AC
  Filled 2018-03-30: qty 1

## 2018-03-30 MED ORDER — BORTEZOMIB CHEMO SQ INJECTION 3.5 MG (2.5MG/ML)
1.3000 mg/m2 | Freq: Once | INTRAMUSCULAR | Status: AC
  Administered 2018-03-30: 2.5 mg via SUBCUTANEOUS
  Filled 2018-03-30: qty 2.5

## 2018-03-30 NOTE — Progress Notes (Signed)
Patient tolerated injection with no complaints voiced.  Injection site clean and dry with no bruising or swelling noted at site.  Band aid applied.  VSS with discharge and left ambulatory with family with no s/s of distress noted.

## 2018-03-30 NOTE — Patient Instructions (Signed)
[facility] Discharge Instructions for Patients Receiving Chemotherapy  Today you received the following chemotherapy agents velcade.    If you develop nausea and vomiting that is not controlled by your nausea medication, call the clinic.   BELOW ARE SYMPTOMS THAT SHOULD BE REPORTED IMMEDIATELY:  *FEVER GREATER THAN 100.5 F  *CHILLS WITH OR WITHOUT FEVER  NAUSEA AND VOMITING THAT IS NOT CONTROLLED WITH YOUR NAUSEA MEDICATION  *UNUSUAL SHORTNESS OF BREATH  *UNUSUAL BRUISING OR BLEEDING  TENDERNESS IN MOUTH AND THROAT WITH OR WITHOUT PRESENCE OF ULCERS  *URINARY PROBLEMS  *BOWEL PROBLEMS  UNUSUAL RASH Items with * indicate a potential emergency and should be followed up as soon as possible.  Feel free to call the clinic should you have any questions or concerns. The clinic phone number is [phone].  Please show the CHEMO ALERT CARD at check-in to the Emergency Department and triage nurse.   

## 2018-03-31 LAB — PROTEIN ELECTROPHORESIS, SERUM
A/G Ratio: 1 (ref 0.7–1.7)
ALPHA-1-GLOBULIN: 0.2 g/dL (ref 0.0–0.4)
Albumin ELP: 3.4 g/dL (ref 2.9–4.4)
Alpha-2-Globulin: 0.7 g/dL (ref 0.4–1.0)
Beta Globulin: 1.1 g/dL (ref 0.7–1.3)
GAMMA GLOBULIN: 1.5 g/dL (ref 0.4–1.8)
Globulin, Total: 3.5 g/dL (ref 2.2–3.9)
TOTAL PROTEIN ELP: 6.9 g/dL (ref 6.0–8.5)

## 2018-03-31 LAB — KAPPA/LAMBDA LIGHT CHAINS
KAPPA FREE LGHT CHN: 100 mg/L — AB (ref 3.3–19.4)
Kappa, lambda light chain ratio: 2.82 — ABNORMAL HIGH (ref 0.26–1.65)
Lambda free light chains: 35.5 mg/L — ABNORMAL HIGH (ref 5.7–26.3)

## 2018-03-31 LAB — BETA 2 MICROGLOBULIN, SERUM: Beta-2 Microglobulin: 3.2 mg/L — ABNORMAL HIGH (ref 0.6–2.4)

## 2018-03-31 LAB — IGG, IGA, IGM
IgA: 393 mg/dL (ref 64–422)
IgG (Immunoglobin G), Serum: 1398 mg/dL (ref 700–1600)
IgM (Immunoglobulin M), Srm: 80 mg/dL (ref 26–217)

## 2018-04-01 ENCOUNTER — Other Ambulatory Visit (HOSPITAL_COMMUNITY): Payer: Self-pay

## 2018-04-01 DIAGNOSIS — C9000 Multiple myeloma not having achieved remission: Secondary | ICD-10-CM

## 2018-04-01 LAB — IMMUNOFIXATION ELECTROPHORESIS
IgA: 405 mg/dL (ref 64–422)
IgG (Immunoglobin G), Serum: 1531 mg/dL (ref 700–1600)
IgM (Immunoglobulin M), Srm: 80 mg/dL (ref 26–217)
TOTAL PROTEIN ELP: 7.1 g/dL (ref 6.0–8.5)

## 2018-04-01 MED ORDER — LENALIDOMIDE 25 MG PO CAPS: ORAL_CAPSULE | ORAL | 0 refills | Status: DC

## 2018-04-01 NOTE — Telephone Encounter (Signed)
Received refill request from patients pharmacy for revlimid. Reviewed with provider, chart checked and refilled.

## 2018-04-06 ENCOUNTER — Inpatient Hospital Stay (HOSPITAL_COMMUNITY): Payer: Medicare Other

## 2018-04-06 ENCOUNTER — Encounter (HOSPITAL_COMMUNITY): Payer: Self-pay | Admitting: Internal Medicine

## 2018-04-06 ENCOUNTER — Inpatient Hospital Stay (HOSPITAL_BASED_OUTPATIENT_CLINIC_OR_DEPARTMENT_OTHER): Payer: Medicare Other | Admitting: Internal Medicine

## 2018-04-06 VITALS — BP 128/54 | HR 50 | Temp 98.3°F | Resp 16 | Wt 184.9 lb

## 2018-04-06 DIAGNOSIS — D509 Iron deficiency anemia, unspecified: Secondary | ICD-10-CM

## 2018-04-06 DIAGNOSIS — Z7982 Long term (current) use of aspirin: Secondary | ICD-10-CM

## 2018-04-06 DIAGNOSIS — Z5112 Encounter for antineoplastic immunotherapy: Secondary | ICD-10-CM | POA: Diagnosis not present

## 2018-04-06 DIAGNOSIS — M8580 Other specified disorders of bone density and structure, unspecified site: Secondary | ICD-10-CM

## 2018-04-06 DIAGNOSIS — E538 Deficiency of other specified B group vitamins: Secondary | ICD-10-CM | POA: Diagnosis not present

## 2018-04-06 DIAGNOSIS — C9000 Multiple myeloma not having achieved remission: Secondary | ICD-10-CM

## 2018-04-06 LAB — CBC WITH DIFFERENTIAL/PLATELET
BASOS ABS: 0 10*3/uL (ref 0.0–0.1)
Basophils Relative: 1 %
Eosinophils Absolute: 0.1 10*3/uL (ref 0.0–0.7)
Eosinophils Relative: 3 %
HCT: 29.1 % — ABNORMAL LOW (ref 36.0–46.0)
Hemoglobin: 9.4 g/dL — ABNORMAL LOW (ref 12.0–15.0)
LYMPHS PCT: 54 %
Lymphs Abs: 1.6 10*3/uL (ref 0.7–4.0)
MCH: 33.5 pg (ref 26.0–34.0)
MCHC: 32.3 g/dL (ref 30.0–36.0)
MCV: 103.6 fL — AB (ref 78.0–100.0)
MONO ABS: 0.3 10*3/uL (ref 0.1–1.0)
MONOS PCT: 11 %
Neutro Abs: 0.9 10*3/uL — ABNORMAL LOW (ref 1.7–7.7)
Neutrophils Relative %: 31 %
PLATELETS: 172 10*3/uL (ref 150–400)
RBC: 2.81 MIL/uL — ABNORMAL LOW (ref 3.87–5.11)
RDW: 15.2 % (ref 11.5–15.5)
WBC: 2.9 10*3/uL — ABNORMAL LOW (ref 4.0–10.5)

## 2018-04-06 LAB — COMPREHENSIVE METABOLIC PANEL
ALT: 11 U/L — ABNORMAL LOW (ref 14–54)
ANION GAP: 9 (ref 5–15)
AST: 14 U/L — AB (ref 15–41)
Albumin: 3.6 g/dL (ref 3.5–5.0)
Alkaline Phosphatase: 82 U/L (ref 38–126)
BUN: 20 mg/dL (ref 6–20)
CHLORIDE: 107 mmol/L (ref 101–111)
CO2: 24 mmol/L (ref 22–32)
Calcium: 9.2 mg/dL (ref 8.9–10.3)
Creatinine, Ser: 1 mg/dL (ref 0.44–1.00)
GFR calc Af Amer: >60 mL/min (ref >60)
GFR, EST NON AFRICAN AMERICAN: 53 mL/min — AB (ref >60)
Glucose, Bld: 251 mg/dL — ABNORMAL HIGH (ref 65–99)
POTASSIUM: 3.7 mmol/L (ref 3.5–5.1)
Sodium: 140 mmol/L (ref 135–145)
TOTAL PROTEIN: 7.3 g/dL (ref 6.5–8.1)
Total Bilirubin: 0.5 mg/dL (ref 0.3–1.2)

## 2018-04-06 LAB — LACTATE DEHYDROGENASE: LDH: 143 U/L (ref 98–192)

## 2018-04-06 MED ORDER — BORTEZOMIB CHEMO SQ INJECTION 3.5 MG (2.5MG/ML)
1.3000 mg/m2 | Freq: Once | INTRAMUSCULAR | Status: AC
  Administered 2018-04-06: 2.5 mg via SUBCUTANEOUS
  Filled 2018-04-06: qty 2.5

## 2018-04-06 MED ORDER — PROCHLORPERAZINE MALEATE 10 MG PO TABS
10.0000 mg | ORAL_TABLET | Freq: Once | ORAL | Status: AC
  Administered 2018-04-06: 10 mg via ORAL

## 2018-04-06 MED ORDER — PROCHLORPERAZINE MALEATE 10 MG PO TABS
ORAL_TABLET | ORAL | Status: AC
  Filled 2018-04-06: qty 1

## 2018-04-06 NOTE — Progress Notes (Signed)
Diagnosis Multiple myeloma not having achieved remission ([place]) - Plan: NM PET Image Initial (PI) Whole Body, CBC with Differential/Platelet, Comprehensive metabolic panel, Lactate dehydrogenase, Protein electrophoresis, serum, Kappa/lambda light chains, Beta 2 microglobuline, serum, IgG, IgA, IgM  Staging Cancer Staging No matching staging information was found for the patient.  Assessment and Plan:  1.  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  Labs done 11/25/2018 showed a kappa/lambda ratio 123.43, SPEP 0.9 g/dl.  Cr 1.09, HB 9.4 Calcium 10.5.  Skeletal survey done 02/16/2018 showed  IMPRESSION: Subtle patchy areas of osteopenia of the thoracolumbar spine and possibly distal right clavicle that may reflect subtle changes of multiple myeloma. Otherwise negative.  Based on these findings of free light chain ratio greater than 100 and bone marrow plasma cells greater than 60% with evidence of anemia with hemoglobin 9.4 and an elevated calcium was 10.5, Dr. Sherrine Maples recommended treatment with  triple regimen with Revlimid and bortezomib with Decadron.  Given her diabetes she recommended Decadron '40mg'$  weekly.   She is on aspirin 81 mg a day for thrombosis prophylaxis.  She is also on acyclovir for HSV prophylaxis.  Patient is here today for evaluation prior to day 15 cycle 3 of Velcade.  She will be treated with 4 cycles of therapy and will undergo PET scan for detailed bone evaluation.  We will continue to monitor labs as therapy proceeds.  SPEP done 03/2018 is negative.  Labs done today 04/06/2018 shw HB 9.4 Cr 1 Ca++ 9.2.  Will discuss with Dr. Norma Fredrickson  once Pet has been reviewed.    2.  Osteopenia.  This was noted on skeletal survey and she also has subtle areas in the spine and clavicle that could represent myeloma.  She will be set up for a PET scan for evaluation after completion of 4 cycles.  She will receive Xgeva  monthly.  3. B12 def- continue B12 monthly.    4.  Iron deficiency anemia.  She has been treated with IV iron in the past.  We will continue to monitor hemoglobin which was 9.4 on labs done 04/06/2018.    Interval History:  77 yo Patient was seen initially for anemia by Dr. Sherrine Maples. Workup for anemia showed an IgM monoclonal protein in the serum.  Bone marrow biopsy was ordered which was performed 01/03/2018  and showed 60% infiltration by plasma cells Two different clones were detected 1 clone had normal karyotype by FISH the other Clone had multiple hyperdiploid cells. Kappa/lambda ratio done 11/25/2017 was elevated at 123.43.  Skeletal survey done 02/16/2018 showed IMPRESSION: Subtle patchy areas of osteopenia of the thoracolumbar spine and possibly distal right clavicle that may reflect subtle changes of multiple myeloma. Otherwise negative.  Per Dr. Corliss Skains note from 01/20/2018:   "I recommended triple regimen with Revlimid and bortezomib with Decadron.  Given her diabetes I would do a low-dose( '40mg'$  weekly) Decadron.  Potential side effects particularly with diabetic neuropathy there is increased risk of worsening neuropathy with Velcade.  We will offer subcutaneous Velcade weekly. Risk of cytopenias, myelosuppression and risk of infections from neutropenia, fatigue from anemia and bleeding or easy bruising from thrombocytopenia have been explained.  She will be at high risk for varicella-zoster infection and DVT. She will need herpes zoster prophylaxis. We will begin aspirin 81 mg a day to reduce her risk of thrombosis."   Current Status: She is here today for follow-up.  She is here today for cycle 3 day 15  of Velcade.   Problem List Patient Active Problem List   Diagnosis Date Noted  . B12 deficiency [E53.8] 02/09/2018  . Multiple myeloma not having achieved remission ([place]) [C90.00] 01/20/2018  . Iron deficiency anemia [D50.9] 11/25/2017    Past Medical History Past Medical  History:  Diagnosis Date  . Breast cancer ([place])    left breast/ 2008/ surg/ rad tx  . Coronary artery disease   . Diabetes mellitus     Past Surgical History Past Surgical History:  Procedure Laterality Date  . ABDOMINAL HYSTERECTOMY    . BREAST SURGERY      Family History History reviewed. No pertinent family history.   Social History  reports that she has never smoked. She has never used smokeless tobacco. She reports that she does not drink alcohol or use drugs.  Medications  Current Outpatient Medications:  .  acetaminophen (TYLENOL) 500 MG tablet, Take 500 mg by mouth every 6 (six) hours as needed for mild pain or moderate pain., Disp: , Rfl:  .  acyclovir (ZOVIRAX) 400 MG tablet, Take 1 tablet (400 mg total) by mouth 2 (two) times daily., Disp: 60 tablet, Rfl: 3 .  aspirin 81 MG tablet, Take 81 mg by mouth daily.  , Disp: , Rfl:  .  bortezomib IV (VELCADE) 3.5 MG injection, Inject into the vein once. weekly, Disp: , Rfl:  .  cholecalciferol (VITAMIN D) 1000 units tablet, Take 1,000 Units by mouth daily., Disp: , Rfl:  .  Denosumab (XGEVA [place]), Inject into the skin. Every 28 days, Disp: , Rfl:  .  dexamethasone (DECADRON) 4 MG tablet, Take 10 tablets (40 mg) on days 1, 8, and 15 of chemo. Repeat every 21 days., Disp: 30 tablet, Rfl: 3 .  glipiZIDE (GLUCOTROL) 5 MG tablet, , Disp: , Rfl:  .  lenalidomide (REVLIMID) 25 MG capsule, Take one capsule daily on days 1-14 every 21 days., Disp: 14 capsule, Rfl: 0 .  lisinopril-hydrochlorothiazide (PRINZIDE,ZESTORETIC) 20-25 MG tablet, , Disp: , Rfl:  .  metFORMIN (GLUCOPHAGE) 1000 MG tablet, Take 1,000 mg by mouth 2 times daily at 12 noon and 4 pm.  , Disp: , Rfl:  .  ondansetron (ZOFRAN) 8 MG tablet, Take 1 tablet (8 mg total) by mouth 2 (two) times daily as needed (Nausea or vomiting)., Disp: 30 tablet, Rfl: 1 .  prochlorperazine (COMPAZINE) 10 MG tablet, Take 1 tablet (10 mg total) by mouth every 6 (six) hours as needed (Nausea or  vomiting)., Disp: 30 tablet, Rfl: 1  Allergies Motrin [ibuprofen]  Review of Systems Review of Systems - Oncology ROS as per HPI otherwise 12 point ROS is negative.   Physical Exam  Vitals Wt Readings from Last 3 Encounters:  04/06/18 184 lb 14.4 oz (83.9 kg)  03/30/18 182 lb 9.6 oz (82.8 kg)  03/09/18 179 lb 3.2 oz (81.3 kg)   Temp Readings from Last 3 Encounters:  04/06/18 98.3 F (36.8 C) (Oral)  03/30/18 98.3 F (36.8 C) (Oral)  03/09/18 (!) 97.2 F (36.2 C) (Oral)   BP Readings from Last 3 Encounters:  04/06/18 (!) 128/54  03/30/18 (!) 128/48  03/09/18 (!) 121/49   Pulse Readings from Last 3 Encounters:  04/06/18 (!) 50  03/30/18 (!) 53  03/09/18 75    Constitutional: Well-developed, well-nourished, and in no distress.   HENT: Head: Normocephalic and atraumatic.  Mouth/Throat: No oropharyngeal exudate. Mucosa moist. Eyes: Pupils are equal, round, and reactive to light. Conjunctivae are normal. No scleral icterus.  Neck:  Normal range of motion. Neck supple. No JVD present.  Cardiovascular: Normal rate, regular rhythm and normal heart sounds.  Exam reveals no gallop and no friction rub.   No murmur heard. Pulmonary/Chest: Effort normal and breath sounds normal. No respiratory distress. No wheezes.No rales.  Abdominal: Soft. Bowel sounds are normal. No distension. There is no tenderness. There is no guarding.  Musculoskeletal: No edema or tenderness.  Lymphadenopathy: No cervical, axillary or supraclavicular adenopathy.  Neurological: Alert and oriented to person, place, and time. No cranial nerve deficit.  Skin: Skin is warm and dry. No rash noted. No erythema. No pallor.  Psychiatric: Affect and judgment normal.   Labs Appointment on 04/06/2018  Component Date Value Ref Range Status  . WBC 04/06/2018 2.9* 4.0 - 10.5 K/uL Final  . RBC 04/06/2018 2.81* 3.87 - 5.11 MIL/uL Final  . Hemoglobin 04/06/2018 9.4* 12.0 - 15.0 g/dL Final  . HCT 04/06/2018 29.1* 36.0  - 46.0 % Final  . MCV 04/06/2018 103.6* 78.0 - 100.0 fL Final  . MCH 04/06/2018 33.5  26.0 - 34.0 pg Final  . MCHC 04/06/2018 32.3  30.0 - 36.0 g/dL Final  . RDW 04/06/2018 15.2  11.5 - 15.5 % Final  . Platelets 04/06/2018 172  150 - 400 K/uL Final  . Neutrophils Relative % 04/06/2018 31  % Final  . Neutro Abs 04/06/2018 0.9* 1.7 - 7.7 K/uL Final  . Lymphocytes Relative 04/06/2018 54  % Final  . Lymphs Abs 04/06/2018 1.6  0.7 - 4.0 K/uL Final  . Monocytes Relative 04/06/2018 11  % Final  . Monocytes Absolute 04/06/2018 0.3  0.1 - 1.0 K/uL Final  . Eosinophils Relative 04/06/2018 3  % Final  . Eosinophils Absolute 04/06/2018 0.1  0.0 - 0.7 K/uL Final  . Basophils Relative 04/06/2018 1  % Final  . Basophils Absolute 04/06/2018 0.0  0.0 - 0.1 K/uL Final   Performed at [facility], [address] [place], [place] [address]  . Sodium 04/06/2018 140  135 - 145 mmol/L Final  . Potassium 04/06/2018 3.7  3.5 - 5.1 mmol/L Final  . Chloride 04/06/2018 107  101 - 111 mmol/L Final  . CO2 04/06/2018 24  22 - 32 mmol/L Final  . Glucose, Bld 04/06/2018 251* 65 - 99 mg/dL Final  . BUN 04/06/2018 20  6 - 20 mg/dL Final  . Creatinine, Ser 04/06/2018 1.00  0.44 - 1.00 mg/dL Final  . Calcium 04/06/2018 9.2  8.9 - 10.3 mg/dL Final  . Total Protein 04/06/2018 7.3  6.5 - 8.1 g/dL Final  . Albumin 04/06/2018 3.6  3.5 - 5.0 g/dL Final  . AST 04/06/2018 14* 15 - 41 U/L Final  . ALT 04/06/2018 11* 14 - 54 U/L Final  . Alkaline Phosphatase 04/06/2018 82  38 - 126 U/L Final  . Total Bilirubin 04/06/2018 0.5  0.3 - 1.2 mg/dL Final  . GFR calc non Af Amer 04/06/2018 53* >60 mL/min Final  . GFR calc Af Amer 04/06/2018 >60  >60 mL/min Final   Comment: (NOTE) The eGFR has been calculated using the CKD EPI equation. This calculation has not been validated in all clinical situations. eGFR's persistently <60 mL/min signify possible Chronic Kidney Disease.   Georgiann Hahn gap 04/06/2018 9  5 - 15 Final   Performed at  [facility], [address] [place], [place] [address]  . LDH 04/06/2018 143  98 - 192 U/L Final   Performed at [facility], [address] [place], [place] [address]  Pathology Orders Placed This Encounter  Procedures  . NM PET Image Initial (PI) Whole Body    Standing Status:   Future    Standing Expiration Date:   04/06/2019    Order Specific Question:   If indicated for the ordered procedure, I authorize the administration of a radiopharmaceutical per Radiology protocol    Answer:   Yes    Order Specific Question:   Preferred imaging location?    Answer:   [facility]    Order Specific Question:   Radiology Contrast Protocol - do NOT remove file path    Answer:   [email or web address]  . CBC with Differential/Platelet    Standing Status:   Future    Standing Expiration Date:   04/07/2019  . Comprehensive metabolic panel    Standing Status:   Future    Standing Expiration Date:   04/07/2019  . Lactate dehydrogenase    Standing Status:   Future    Standing Expiration Date:   04/07/2019  . Protein electrophoresis, serum    Standing Status:   Future    Standing Expiration Date:   04/07/2019  . Kappa/lambda light chains    Standing Status:   Future    Standing Expiration Date:   04/07/2019  . Beta 2 microglobuline, serum    Standing Status:   Future    Standing Expiration Date:   04/07/2019  . IgG, IgA, IgM    Standing Status:   Future    Standing Expiration Date:   04/07/2019       Zoila Shutter MD

## 2018-04-06 NOTE — Progress Notes (Signed)
Diagnosis Multiple myeloma not having achieved remission ([place]) - Plan: CBC with Differential/Platelet, Comprehensive metabolic panel, Lactate dehydrogenase, CBC with Differential/Platelet, Comprehensive metabolic panel, Lactate dehydrogenase, Protein electrophoresis, serum, IgG, IgA, IgM, Beta 2 microglobuline, serum, Kappa/lambda light chains, Immunofixation electrophoresis, NM PET Image Initial (PI) Whole Body, DISCONTINUED: lenalidomide (REVLIMID) 25 MG capsule, DISCONTINUED: prochlorperazine (COMPAZINE) tablet 10 mg, DISCONTINUED: bortezomib SQ (VELCADE) chemo injection 2.5 mg, DISCONTINUED: prochlorperazine (COMPAZINE) tablet 10 mg, DISCONTINUED: bortezomib SQ (VELCADE) chemo injection 2.5 mg  Staging Cancer Staging No matching staging information was found for the patient.  Assessment and Plan:  1.  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  Labs done 11/25/2018 showed a kappa/lambda ratio 123.43, SPEP 0.9 g/dl.  Cr 1.09, HB 9.4 Calcium 10.5.  Skeletal survey done 02/16/2018 showed  IMPRESSION: Subtle patchy areas of osteopenia of the thoracolumbar spine and possibly distal right clavicle that may reflect subtle changes of multiple myeloma. Otherwise negative.  Based on these findings of free light chain ratio greater than 100 and bone marrow plasma cells greater than 60% with evidence of anemia with hemoglobin 9.4 and an elevated calcium was 10.5, Dr. Sherrine Maples recommended treatment with  triple regimen with Revlimid and bortezomib with Decadron.  Given her diabetes she recommended Decadron 72m weekly.   She is on aspirin 81 mg a day for thrombosis prophylaxis.  She is also on acyclovir for HSV prophylaxis.  Patient is here today for evaluation prior to day 1 cycle 3 of Velcade.  She will be treated with 4 cycles of therapy and will undergo PET scan for detailed bone evaluation.  We will continue to monitor labs as therapy  proceeds.  SPEP is improved at 0.3 g/dL.  Calcium level is improved.  2.  Osteopenia.  This was noted on skeletal survey and she also has subtle areas in the spine and clavicle that could represent myeloma.  She will be set up for a PET scan for evaluation after completion of 4 cycles.  She will receive Xgeva monthly.  3. B12 def- continue B12 monthly.    4.  Iron deficiency anemia.  She has been treated with IV iron in the past.  We will continue to monitor hemoglobin which was 9.1 on recent labs.  Interval History:  77yo Patient was seen initially for anemia by Dr. PSherrine Maples Workup for anemia showed an IgM monoclonal protein in the serum.  Bone marrow biopsy was ordered which was performed 01/03/2018  and showed 60% infiltration by plasma cells Two different clones were detected 1 clone had normal karyotype by FISH the other Clone had multiple hyperdiploid cells. Kappa/lambda ratio done 11/25/2017 was elevated at 123.43.  Skeletal survey done 02/16/2018 showed IMPRESSION: Subtle patchy areas of osteopenia of the thoracolumbar spine and possibly distal right clavicle that may reflect subtle changes of multiple myeloma. Otherwise negative.  Per Dr. PCorliss Skainsnote from 01/20/2018:   "I recommended triple regimen with Revlimid and bortezomib with Decadron.  Given her diabetes I would do a low-dose( 443mweekly) Decadron.  Potential side effects particularly with diabetic neuropathy there is increased risk of worsening neuropathy with Velcade.  We will offer subcutaneous Velcade weekly. Risk of cytopenias, myelosuppression and risk of infections from neutropenia, fatigue from anemia and bleeding or easy bruising from thrombocytopenia have been explained.  She will be at high risk for varicella-zoster infection and DVT. She will need herpes zoster prophylaxis. We will begin aspirin 81 mg a day to reduce her risk of  thrombosis."   Current Status: She is here today for follow-up.  She is here today  for cycle 3 day 1of Velcade.     Problem List Patient Active Problem List   Diagnosis Date Noted  . B12 deficiency [E53.8] 02/09/2018  . Multiple myeloma not having achieved remission ([place]) [C90.00] 01/20/2018  . Iron deficiency anemia [D50.9] 11/25/2017    Past Medical History Past Medical History:  Diagnosis Date  . Breast cancer ([place])    left breast/ 2008/ surg/ rad tx  . Coronary artery disease   . Diabetes mellitus     Past Surgical History Past Surgical History:  Procedure Laterality Date  . ABDOMINAL HYSTERECTOMY    . BREAST SURGERY      Family History History reviewed. No pertinent family history.   Social History  reports that she has never smoked. She has never used smokeless tobacco. She reports that she does not drink alcohol or use drugs.  Medications  Current Outpatient Medications:  .  acetaminophen (TYLENOL) 500 MG tablet, Take 500 mg by mouth every 6 (six) hours as needed for mild pain or moderate pain., Disp: , Rfl:  .  acyclovir (ZOVIRAX) 400 MG tablet, Take 1 tablet (400 mg total) by mouth 2 (two) times daily., Disp: 60 tablet, Rfl: 3 .  aspirin 81 MG tablet, Take 81 mg by mouth daily.  , Disp: , Rfl:  .  bortezomib IV (VELCADE) 3.5 MG injection, Inject into the vein once. weekly, Disp: , Rfl:  .  cholecalciferol (VITAMIN D) 1000 units tablet, Take 1,000 Units by mouth daily., Disp: , Rfl:  .  Denosumab (XGEVA [place]), Inject into the skin. Every 28 days, Disp: , Rfl:  .  dexamethasone (DECADRON) 4 MG tablet, Take 10 tablets (40 mg) on days 1, 8, and 15 of chemo. Repeat every 21 days., Disp: 30 tablet, Rfl: 3 .  glipiZIDE (GLUCOTROL) 5 MG tablet, , Disp: , Rfl:  .  lenalidomide (REVLIMID) 25 MG capsule, Take one capsule daily on days 1-14 every 21 days., Disp: 14 capsule, Rfl: 0 .  lisinopril-hydrochlorothiazide (PRINZIDE,ZESTORETIC) 20-25 MG tablet, , Disp: , Rfl:  .  metFORMIN (GLUCOPHAGE) 1000 MG tablet, Take 1,000 mg by mouth 2 times daily at 12 noon  and 4 pm.  , Disp: , Rfl:  .  ondansetron (ZOFRAN) 8 MG tablet, Take 1 tablet (8 mg total) by mouth 2 (two) times daily as needed (Nausea or vomiting)., Disp: 30 tablet, Rfl: 1 .  prochlorperazine (COMPAZINE) 10 MG tablet, Take 1 tablet (10 mg total) by mouth every 6 (six) hours as needed (Nausea or vomiting)., Disp: 30 tablet, Rfl: 1  Allergies Motrin [ibuprofen]  Review of Systems Review of Systems - Oncology ROS as per HPI otherwise 12 point ROS is negative.   Physical Exam  Vitals Wt Readings from Last 3 Encounters:  03/30/18 182 lb 9.6 oz (82.8 kg)  03/09/18 179 lb 3.2 oz (81.3 kg)  03/02/18 178 lb 9.6 oz (81 kg)   Temp Readings from Last 3 Encounters:  03/30/18 98.3 F (36.8 C) (Oral)  03/09/18 (!) 97.2 F (36.2 C) (Oral)  03/02/18 98.2 F (36.8 C) (Oral)   BP Readings from Last 3 Encounters:  03/30/18 (!) 128/48  03/09/18 (!) 121/49  03/02/18 (!) 121/45   Pulse Readings from Last 3 Encounters:  03/30/18 (!) 53  03/09/18 75  03/02/18 75   Constitutional: Well-developed, well-nourished, and in no distress.   HENT: Head: Normocephalic and atraumatic.  Mouth/Throat: No oropharyngeal  exudate. Mucosa moist. Eyes: Pupils are equal, round, and reactive to light. Conjunctivae are normal. No scleral icterus.  Neck: Normal range of motion. Neck supple. No JVD present.  Cardiovascular: Normal rate, regular rhythm and normal heart sounds.  Exam reveals no gallop and no friction rub.   No murmur heard. Pulmonary/Chest: Effort normal and breath sounds normal. No respiratory distress. No wheezes.No rales.  Abdominal: Soft. Bowel sounds are normal. No distension. There is no tenderness. There is no guarding.  Musculoskeletal: No edema or tenderness.  Lymphadenopathy: No cervical, axillary or supraclavicular adenopathy.  Neurological: Alert and oriented to person, place, and time. No cranial nerve deficit.  Skin: Skin is warm and dry. No rash noted. No erythema. No pallor.   Psychiatric: Affect and judgment normal.   Labs Appointment on 03/09/2018  Component Date Value Ref Range Status  . WBC 03/09/2018 3.9* 4.0 - 10.5 K/uL Final  . RBC 03/09/2018 2.71* 3.87 - 5.11 MIL/uL Final  . Hemoglobin 03/09/2018 9.1* 12.0 - 15.0 g/dL Final  . HCT 03/09/2018 28.0* 36.0 - 46.0 % Final  . MCV 03/09/2018 103.3* 78.0 - 100.0 fL Final  . MCH 03/09/2018 33.6  26.0 - 34.0 pg Final  . MCHC 03/09/2018 32.5  30.0 - 36.0 g/dL Final  . RDW 03/09/2018 15.0  11.5 - 15.5 % Final  . Platelets 03/09/2018 204  150 - 400 K/uL Final  . Neutrophils Relative % 03/09/2018 61  % Final  . Lymphocytes Relative 03/09/2018 28  % Final  . Monocytes Relative 03/09/2018 7  % Final  . Eosinophils Relative 03/09/2018 3  % Final  . Basophils Relative 03/09/2018 1  % Final  . Neutro Abs 03/09/2018 2.4  1.7 - 7.7 K/uL Final  . Lymphs Abs 03/09/2018 1.1  0.7 - 4.0 K/uL Final  . Monocytes Absolute 03/09/2018 0.3  0.1 - 1.0 K/uL Final  . Eosinophils Absolute 03/09/2018 0.1  0.0 - 0.7 K/uL Final  . Basophils Absolute 03/09/2018 0.0  0.0 - 0.1 K/uL Final  . WBC Morphology 03/09/2018 FEW BANDS   Final   Performed at [facility], [address], [place], [place] [address]  . Sodium 03/09/2018 139  135 - 145 mmol/L Final  . Potassium 03/09/2018 3.1* 3.5 - 5.1 mmol/L Final  . Chloride 03/09/2018 103  101 - 111 mmol/L Final  . CO2 03/09/2018 26  22 - 32 mmol/L Final  . Glucose, Bld 03/09/2018 82  65 - 99 mg/dL Final  . BUN 03/09/2018 18  6 - 20 mg/dL Final  . Creatinine, Ser 03/09/2018 0.96  0.44 - 1.00 mg/dL Final  . Calcium 03/09/2018 9.0  8.9 - 10.3 mg/dL Final  . Total Protein 03/09/2018 7.3  6.5 - 8.1 g/dL Final  . Albumin 03/09/2018 3.4* 3.5 - 5.0 g/dL Final  . AST 03/09/2018 18  15 - 41 U/L Final  . ALT 03/09/2018 12* 14 - 54 U/L Final  . Alkaline Phosphatase 03/09/2018 75  38 - 126 U/L Final  . Total Bilirubin 03/09/2018 0.5  0.3 - 1.2 mg/dL Final  . GFR calc non Af Amer 03/09/2018 56* >60  mL/min Final  . GFR calc Af Amer 03/09/2018 >60  >60 mL/min Final   Comment: (NOTE) The eGFR has been calculated using the CKD EPI equation. This calculation has not been validated in all clinical situations. eGFR's persistently <60 mL/min signify possible Chronic Kidney Disease.   . Anion gap 03/09/2018 10  5 - 15 Final   Performed at [facility], [address] [place], [place] [address]  . LDH 03/09/2018 137  98 - 192 U/L Final   Performed at [facility], [address] [place], [place] [address]     Pathology Orders Placed This Encounter  Procedures  . NM PET Image Initial (PI) Whole Body    Standing Status:   Future    Standing Expiration Date:   03/09/2019    Order Specific Question:   If indicated for the ordered procedure, I authorize the administration of a radiopharmaceutical per Radiology protocol    Answer:   Yes    Order Specific Question:   Preferred imaging location?    Answer:   [facility]    Order Specific Question:   Radiology Contrast Protocol - do NOT remove file path    Answer:   [email or web address]  . CBC with Differential/Platelet    Standing Status:   Future    Standing Expiration Date:   03/10/2019  . Comprehensive metabolic panel    Standing Status:   Future    Standing Expiration Date:   03/10/2019  . Lactate dehydrogenase    Standing Status:   Future    Standing Expiration Date:   03/10/2019  . CBC with Differential/Platelet    Standing Status:   Future    Number of Occurrences:   1    Standing Expiration Date:   03/10/2019  . Comprehensive metabolic panel    Standing Status:   Future    Number of Occurrences:   1    Standing Expiration Date:   03/10/2019  . Lactate dehydrogenase    Standing Status:   Future    Number of Occurrences:   1    Standing Expiration Date:   03/10/2019  . Protein electrophoresis, serum    Standing Status:   Future    Number of Occurrences:   1    Standing Expiration Date:   03/10/2019   . IgG, IgA, IgM    Standing Status:   Future    Number of Occurrences:   1    Standing Expiration Date:   03/09/2019  . Beta 2 microglobuline, serum    Standing Status:   Future    Number of Occurrences:   1    Standing Expiration Date:   03/09/2019  . Kappa/lambda light chains    Standing Status:   Future    Number of Occurrences:   1    Standing Expiration Date:   03/09/2019  . Immunofixation electrophoresis    Standing Status:   Future    Number of Occurrences:   1    Standing Expiration Date:   03/09/2019       Zoila Shutter MD

## 2018-04-06 NOTE — Progress Notes (Signed)
Labs reviewed by Dr. Walden Field - okay to tx today per MD.   Brittany Archer presents today for injection per the provider's orders.  Velcade administration without incident; see MAR for injection details.  Patient tolerated procedure well and without incident.  No questions or complaints noted at this time.  Discharged ambulatory.

## 2018-04-06 NOTE — Patient Instructions (Signed)
[place] at [facility] Discharge Instructions  Seen by Dr. Walden Field today. F/U as scheduled   Thank you for choosing [place] at [facility] to provide your oncology and hematology care.  To afford each patient quality time with our provider, please arrive at least 15 minutes before your scheduled appointment time.   If you have a lab appointment with the [facility] please come in thru the  Main Entrance and check in at the main information desk  You need to re-schedule your appointment should you arrive 10 or more minutes late.  We strive to give you quality time with our providers, and arriving late affects you and other patients whose appointments are after yours.  Also, if you no show three or more times for appointments you may be dismissed from the clinic at the providers discretion.     Again, thank you for choosing [name].  Our hope is that these requests will decrease the amount of time that you wait before being seen by our physicians.       _____________________________________________________________  Should you have questions after your visit to [facility], please contact our office at [phone] between the hours of 8:30 a.m. and 4:30 p.m.  Voicemails left after 4:30 p.m. will not be returned until the following business day.  For prescription refill requests, have your pharmacy contact our office.       Resources For Cancer Patients and their Caregivers ? American Cancer Society: Can assist with transportation, wigs, general needs, runs Look Good Feel Better.        6504029091 ? Cancer Care: Provides financial assistance, online support groups, medication/co-pay assistance.  1-800-813-HOPE 314-393-2640) ? [place] Assists [place] cancer patients and their families through emotional , educational and financial support.  901-512-7575 ? [place] DSS Where  to apply for food stamps, Medicaid and utility assistance. 4105712266 ? [employer]: Transportation to medical appointments. (405)208-2084 ? Social Security Administration: May apply for disability if have a Stage IV cancer. 385-837-4474 720-721-2234 ? [employer], Disability and Transit Services: Assists with nutrition, care and transit needs. [place] Support Programs:   > Cancer Support Group  2nd Tuesday of the month 1pm-2pm, Journey Room   > Creative Journey  3rd Tuesday of the month 1130am-1pm, Journey Room

## 2018-04-13 ENCOUNTER — Inpatient Hospital Stay (HOSPITAL_COMMUNITY): Payer: Medicare Other

## 2018-04-13 ENCOUNTER — Encounter (HOSPITAL_COMMUNITY): Payer: Self-pay

## 2018-04-13 VITALS — BP 115/48 | HR 78 | Temp 98.2°F | Resp 18 | Wt 184.6 lb

## 2018-04-13 DIAGNOSIS — Z5112 Encounter for antineoplastic immunotherapy: Secondary | ICD-10-CM | POA: Diagnosis not present

## 2018-04-13 DIAGNOSIS — C9000 Multiple myeloma not having achieved remission: Secondary | ICD-10-CM

## 2018-04-13 DIAGNOSIS — E538 Deficiency of other specified B group vitamins: Secondary | ICD-10-CM

## 2018-04-13 LAB — LACTATE DEHYDROGENASE: LDH: 131 U/L (ref 98–192)

## 2018-04-13 LAB — CBC WITH DIFFERENTIAL/PLATELET
BASOS ABS: 0 10*3/uL (ref 0.0–0.1)
Basophils Relative: 0 %
EOS ABS: 0.4 10*3/uL (ref 0.0–0.7)
Eosinophils Relative: 14 %
HEMATOCRIT: 28.1 % — AB (ref 36.0–46.0)
Hemoglobin: 9.2 g/dL — ABNORMAL LOW (ref 12.0–15.0)
Lymphocytes Relative: 46 %
Lymphs Abs: 1.4 10*3/uL (ref 0.7–4.0)
MCH: 33.5 pg (ref 26.0–34.0)
MCHC: 32.7 g/dL (ref 30.0–36.0)
MCV: 102.2 fL — ABNORMAL HIGH (ref 78.0–100.0)
MONO ABS: 0.2 10*3/uL (ref 0.1–1.0)
Monocytes Relative: 5 %
NEUTROS ABS: 1.1 10*3/uL — AB (ref 1.7–7.7)
NEUTROS PCT: 35 %
Platelets: 251 10*3/uL (ref 150–400)
RBC: 2.75 MIL/uL — ABNORMAL LOW (ref 3.87–5.11)
RDW: 15.9 % — AB (ref 11.5–15.5)
WBC: 3.1 10*3/uL — ABNORMAL LOW (ref 4.0–10.5)

## 2018-04-13 LAB — COMPREHENSIVE METABOLIC PANEL
ALT: 12 U/L — ABNORMAL LOW (ref 14–54)
ANION GAP: 10 (ref 5–15)
AST: 17 U/L (ref 15–41)
Albumin: 3.7 g/dL (ref 3.5–5.0)
Alkaline Phosphatase: 71 U/L (ref 38–126)
BILIRUBIN TOTAL: 0.3 mg/dL (ref 0.3–1.2)
BUN: 20 mg/dL (ref 6–20)
CHLORIDE: 105 mmol/L (ref 101–111)
CO2: 24 mmol/L (ref 22–32)
Calcium: 9.1 mg/dL (ref 8.9–10.3)
Creatinine, Ser: 1.14 mg/dL — ABNORMAL HIGH (ref 0.44–1.00)
GFR calc Af Amer: 52 mL/min — ABNORMAL LOW (ref >60)
GFR calc non Af Amer: 45 mL/min — ABNORMAL LOW (ref >60)
GLUCOSE: 132 mg/dL — AB (ref 65–99)
POTASSIUM: 3.2 mmol/L — AB (ref 3.5–5.1)
SODIUM: 139 mmol/L (ref 135–145)
TOTAL PROTEIN: 7.1 g/dL (ref 6.5–8.1)

## 2018-04-13 MED ORDER — CYANOCOBALAMIN 1000 MCG/ML IJ SOLN
INTRAMUSCULAR | Status: AC
Start: 2018-04-13 — End: ?
  Filled 2018-04-13: qty 1

## 2018-04-13 MED ORDER — DENOSUMAB 120 MG/1.7ML ~~LOC~~ SOLN
120.0000 mg | Freq: Once | SUBCUTANEOUS | Status: AC
  Administered 2018-04-13: 120 mg via SUBCUTANEOUS
  Filled 2018-04-13: qty 1.7

## 2018-04-13 MED ORDER — CYANOCOBALAMIN 1000 MCG/ML IJ SOLN
1000.0000 ug | Freq: Once | INTRAMUSCULAR | Status: AC
  Administered 2018-04-13: 1000 ug via INTRAMUSCULAR

## 2018-04-13 MED ORDER — BORTEZOMIB CHEMO SQ INJECTION 3.5 MG (2.5MG/ML)
1.3000 mg/m2 | Freq: Once | INTRAMUSCULAR | Status: AC
  Administered 2018-04-13: 2.5 mg via SUBCUTANEOUS
  Filled 2018-04-13: qty 2.5

## 2018-04-13 MED ORDER — PROCHLORPERAZINE MALEATE 10 MG PO TABS
10.0000 mg | ORAL_TABLET | Freq: Once | ORAL | Status: AC
  Administered 2018-04-13: 10 mg via ORAL

## 2018-04-13 MED ORDER — PROCHLORPERAZINE MALEATE 10 MG PO TABS
ORAL_TABLET | ORAL | Status: AC
  Filled 2018-04-13: qty 1

## 2018-04-13 NOTE — Progress Notes (Signed)
[place]ave reviewed with Dr. Delton Coombes and pt approved for Velcade injection today per MD                 Yves Dill Covey tolerated Velcade,Xgeva and Vit B12 injections well without complaint or incident. Calcium 9.1 today and pt denied any tooth or jaw pain and no recent or future dental visits. VSS Pt discharged self ambulatory in satisfactory condition accompanied by family member

## 2018-04-13 NOTE — Progress Notes (Signed)
Brittany Archer continues to take her Revlimid as prescribed without any issues per pt

## 2018-04-13 NOTE — Patient Instructions (Signed)
[facility] Discharge Instructions for Patients Receiving Chemotherapy   Beginning January 23rd 2017 lab work for the [facility] will be done in the  Main lab at [facility] on 1st floor. If you have a lab appointment with the [facility] please come in thru the  Main Entrance and check in at the main information desk   Today you received the following chemotherapy agents Velcade injection as well as Xgeva and Vit B12 injections. Follow-up as scheduled. Call clinic for any questions or concerns  To help prevent nausea and vomiting after your treatment, we encourage you to take your nausea medication   If you develop nausea and vomiting, or diarrhea that is not controlled by your medication, call the clinic.  The clinic phone number is [phone]. Office hours are Monday-Friday 8:30am-5:00pm.  BELOW ARE SYMPTOMS THAT SHOULD BE REPORTED IMMEDIATELY:  *FEVER GREATER THAN 101.0 F  *CHILLS WITH OR WITHOUT FEVER  NAUSEA AND VOMITING THAT IS NOT CONTROLLED WITH YOUR NAUSEA MEDICATION  *UNUSUAL SHORTNESS OF BREATH  *UNUSUAL BRUISING OR BLEEDING  TENDERNESS IN MOUTH AND THROAT WITH OR WITHOUT PRESENCE OF ULCERS  *URINARY PROBLEMS  *BOWEL PROBLEMS  UNUSUAL RASH Items with * indicate a potential emergency and should be followed up as soon as possible. If you have an emergency after office hours please contact your primary care physician or go to the nearest emergency department.  Please call the clinic during office hours if you have any questions or concerns.   You may also contact the Patient Navigator at [phone] should you have any questions or need assistance in obtaining follow up care.      Resources For Cancer Patients and their Caregivers ? American Cancer Society: Can assist with transportation, wigs, general needs, runs Look Good Feel Better.        305-082-3489 ? Cancer Care: Provides financial assistance, online support groups,  medication/co-pay assistance.  1-800-813-HOPE 787-483-4193) ? [place] Assists [place] cancer patients and their families through emotional , educational and financial support.  408-480-8863 ? [place] DSS Where to apply for food stamps, Medicaid and utility assistance. 867-289-0918 ? RCATS: Transportation to medical appointments. 740-466-1901 ? Social Security Administration: May apply for disability if have a Stage IV cancer. 719-529-9520 (305)715-3257 ? [employer], Disability and Transit Services: Assists with nutrition, care and transit needs. [phone]

## 2018-04-20 ENCOUNTER — Ambulatory Visit (HOSPITAL_COMMUNITY): Payer: Medicare Other

## 2018-04-20 ENCOUNTER — Other Ambulatory Visit (HOSPITAL_COMMUNITY): Payer: Medicare Other

## 2018-04-21 ENCOUNTER — Inpatient Hospital Stay (HOSPITAL_COMMUNITY): Payer: Medicare Other

## 2018-04-21 ENCOUNTER — Encounter (HOSPITAL_COMMUNITY): Payer: Self-pay

## 2018-04-21 ENCOUNTER — Other Ambulatory Visit (HOSPITAL_COMMUNITY): Payer: Self-pay

## 2018-04-21 VITALS — BP 142/60 | HR 80 | Temp 98.1°F | Resp 18 | Wt 183.2 lb

## 2018-04-21 DIAGNOSIS — C9000 Multiple myeloma not having achieved remission: Secondary | ICD-10-CM

## 2018-04-21 DIAGNOSIS — E876 Hypokalemia: Secondary | ICD-10-CM

## 2018-04-21 DIAGNOSIS — Z5112 Encounter for antineoplastic immunotherapy: Secondary | ICD-10-CM | POA: Diagnosis not present

## 2018-04-21 LAB — CBC WITH DIFFERENTIAL/PLATELET
BASOS ABS: 0 10*3/uL (ref 0.0–0.1)
BASOS PCT: 0 %
Eosinophils Absolute: 0.1 10*3/uL (ref 0.0–0.7)
Eosinophils Relative: 2 %
HEMATOCRIT: 28.3 % — AB (ref 36.0–46.0)
Hemoglobin: 9.1 g/dL — ABNORMAL LOW (ref 12.0–15.0)
Lymphocytes Relative: 32 %
Lymphs Abs: 1.1 10*3/uL (ref 0.7–4.0)
MCH: 33.5 pg (ref 26.0–34.0)
MCHC: 32.2 g/dL (ref 30.0–36.0)
MCV: 104 fL — ABNORMAL HIGH (ref 78.0–100.0)
MONO ABS: 0.4 10*3/uL (ref 0.1–1.0)
Monocytes Relative: 12 %
NEUTROS ABS: 1.9 10*3/uL (ref 1.7–7.7)
Neutrophils Relative %: 54 %
Platelets: 186 10*3/uL (ref 150–400)
RBC: 2.72 MIL/uL — AB (ref 3.87–5.11)
RDW: 15.1 % (ref 11.5–15.5)
WBC: 3.4 10*3/uL — AB (ref 4.0–10.5)

## 2018-04-21 LAB — COMPREHENSIVE METABOLIC PANEL
ALBUMIN: 3.7 g/dL (ref 3.5–5.0)
ALT: 14 U/L (ref 14–54)
AST: 16 U/L (ref 15–41)
Alkaline Phosphatase: 68 U/L (ref 38–126)
Anion gap: 12 (ref 5–15)
BILIRUBIN TOTAL: 0.9 mg/dL (ref 0.3–1.2)
BUN: 15 mg/dL (ref 6–20)
CO2: 26 mmol/L (ref 22–32)
Calcium: 8.9 mg/dL (ref 8.9–10.3)
Chloride: 103 mmol/L (ref 101–111)
Creatinine, Ser: 0.96 mg/dL (ref 0.44–1.00)
GFR calc Af Amer: >60 mL/min (ref >60)
GFR calc non Af Amer: 55 mL/min — ABNORMAL LOW (ref >60)
GLUCOSE: 102 mg/dL — AB (ref 65–99)
POTASSIUM: 3 mmol/L — AB (ref 3.5–5.1)
Sodium: 141 mmol/L (ref 135–145)
Total Protein: 7.3 g/dL (ref 6.5–8.1)

## 2018-04-21 LAB — LACTATE DEHYDROGENASE: LDH: 141 U/L (ref 98–192)

## 2018-04-21 MED ORDER — PROCHLORPERAZINE MALEATE 10 MG PO TABS
10.0000 mg | ORAL_TABLET | Freq: Once | ORAL | Status: AC
  Administered 2018-04-21: 10 mg via ORAL

## 2018-04-21 MED ORDER — PROCHLORPERAZINE MALEATE 10 MG PO TABS
ORAL_TABLET | ORAL | Status: AC
  Filled 2018-04-21: qty 1

## 2018-04-21 MED ORDER — LENALIDOMIDE 25 MG PO CAPS: ORAL_CAPSULE | ORAL | 0 refills | Status: DC

## 2018-04-21 MED ORDER — POTASSIUM CHLORIDE CRYS ER 20 MEQ PO TBCR: 20.0000 meq | EXTENDED_RELEASE_TABLET | Freq: Every day | ORAL | 3 refills | Status: DC

## 2018-04-21 MED ORDER — BORTEZOMIB CHEMO SQ INJECTION 3.5 MG (2.5MG/ML)
1.3000 mg/m2 | Freq: Once | INTRAMUSCULAR | Status: AC
  Administered 2018-04-21: 2.5 mg via SUBCUTANEOUS
  Filled 2018-04-21: qty 2.5

## 2018-04-21 MED ORDER — POTASSIUM CHLORIDE CRYS ER 20 MEQ PO TBCR
40.0000 meq | EXTENDED_RELEASE_TABLET | Freq: Once | ORAL | Status: AC
  Administered 2018-04-21: 40 meq via ORAL
  Filled 2018-04-21: qty 2

## 2018-04-21 NOTE — Patient Instructions (Signed)
[facility] Discharge Instructions for Patients Receiving Chemotherapy   Beginning January 23rd 2017 lab work for the Cancer Center will be done in the  Main lab at [facility] on 1st floor. If you have a lab appointment with the Cancer Center please come in thru the  Main Entrance and check in at the main information desk   Today you received the following chemotherapy agents Velcade injection. Follow-up as scheduled. Call clinic for any questions or concerns  To help prevent nausea and vomiting after your treatment, we encourage you to take your nausea medication   If you develop nausea and vomiting, or diarrhea that is not controlled by your medication, call the clinic.  The clinic phone number is [phone]. Office hours are Monday-Friday 8:30am-5:00pm.  BELOW ARE SYMPTOMS THAT SHOULD BE REPORTED IMMEDIATELY:  *FEVER GREATER THAN 101.0 F  *CHILLS WITH OR WITHOUT FEVER  NAUSEA AND VOMITING THAT IS NOT CONTROLLED WITH YOUR NAUSEA MEDICATION  *UNUSUAL SHORTNESS OF BREATH  *UNUSUAL BRUISING OR BLEEDING  TENDERNESS IN MOUTH AND THROAT WITH OR WITHOUT PRESENCE OF ULCERS  *URINARY PROBLEMS  *BOWEL PROBLEMS  UNUSUAL RASH Items with * indicate a potential emergency and should be followed up as soon as possible. If you have an emergency after office hours please contact your primary care physician or go to the nearest emergency department.  Please call the clinic during office hours if you have any questions or concerns.   You may also contact the Patient Navigator at [phone] should you have any questions or need assistance in obtaining follow up care.      Resources For Cancer Patients and their Caregivers ? American Cancer Society: Can assist with transportation, wigs, general needs, runs Look Good Feel Better.        1-888-227-6333 ? Cancer Care: Provides financial assistance, online support groups, medication/co-pay assistance.   1-800-813-HOPE (4673) ? [facility] Assists [place] cancer patients and their families through emotional , educational and financial support.  336-427-4357 ? [place] DSS Where to apply for food stamps, Medicaid and utility assistance. 336-342-1394 ? [employer]: Transportation to medical appointments. 336-347-2287 ? Social Security Administration: May apply for disability if have a Stage IV cancer. 336-342-7796 1-800-772-1213 ? [place] Aging, Disability and Transit Services: Assists with nutrition, care and transit needs. [phone]         

## 2018-04-21 NOTE — Progress Notes (Signed)
Brittany Archer Evinger tolerated Velcade injection well without complaints or incident. Labs reviewed with Dr. Raliegh Ip prior to administering this medication. Potassium 3 today so MD ordered K-dur 40 meq PO today which was given and script was called in for 20 meq PO daily to start tomorrow. Reviewed this information with the pt and family member along with Potassium rich food she could try and pt verbalized understanding. VSS Pt discharged self ambulatory in satisfactory condition accompanied by family member

## 2018-04-21 NOTE — Telephone Encounter (Signed)
Received refill request from patients pharmacy for Revlimid. Reviewed with provider, chart checked and refilled.  

## 2018-04-25 ENCOUNTER — Encounter (HOSPITAL_COMMUNITY)
Admission: RE | Admit: 2018-04-25 | Discharge: 2018-04-25 | Disposition: A | Discharge status: Home / Self Care | Payer: Medicare Other | Source: Ambulatory Visit | Attending: Internal Medicine | Admitting: Internal Medicine

## 2018-04-25 DIAGNOSIS — C9000 Multiple myeloma not having achieved remission: Secondary | ICD-10-CM | POA: Insufficient documentation

## 2018-04-26 ENCOUNTER — Inpatient Hospital Stay (HOSPITAL_COMMUNITY): Payer: Medicare Other

## 2018-04-26 DIAGNOSIS — Z5112 Encounter for antineoplastic immunotherapy: Secondary | ICD-10-CM | POA: Diagnosis not present

## 2018-04-26 DIAGNOSIS — C9000 Multiple myeloma not having achieved remission: Secondary | ICD-10-CM

## 2018-04-26 LAB — COMPREHENSIVE METABOLIC PANEL
ALBUMIN: 3.6 g/dL (ref 3.5–5.0)
ALK PHOS: 68 U/L (ref 38–126)
ALT: 11 U/L — AB (ref 14–54)
AST: 15 U/L (ref 15–41)
Anion gap: 10 (ref 5–15)
BUN: 17 mg/dL (ref 6–20)
CALCIUM: 9.1 mg/dL (ref 8.9–10.3)
CHLORIDE: 103 mmol/L (ref 101–111)
CO2: 24 mmol/L (ref 22–32)
CREATININE: 0.99 mg/dL (ref 0.44–1.00)
GFR calc Af Amer: >60 mL/min (ref >60)
GFR calc non Af Amer: 53 mL/min — ABNORMAL LOW (ref >60)
GLUCOSE: 60 mg/dL — AB (ref 65–99)
Potassium: 3.5 mmol/L (ref 3.5–5.1)
SODIUM: 137 mmol/L (ref 135–145)
Total Bilirubin: 0.8 mg/dL (ref 0.3–1.2)
Total Protein: 7.5 g/dL (ref 6.5–8.1)

## 2018-04-26 LAB — CBC WITH DIFFERENTIAL/PLATELET
BASOS ABS: 0 10*3/uL (ref 0.0–0.1)
BASOS PCT: 1 %
Eosinophils Absolute: 0.1 10*3/uL (ref 0.0–0.7)
Eosinophils Relative: 3 %
HCT: 27.9 % — ABNORMAL LOW (ref 36.0–46.0)
HEMOGLOBIN: 9.1 g/dL — AB (ref 12.0–15.0)
LYMPHS ABS: 1.7 10*3/uL (ref 0.7–4.0)
Lymphocytes Relative: 57 %
MCH: 33.1 pg (ref 26.0–34.0)
MCHC: 32.6 g/dL (ref 30.0–36.0)
MCV: 101.5 fL — ABNORMAL HIGH (ref 78.0–100.0)
Monocytes Absolute: 0.5 10*3/uL (ref 0.1–1.0)
Monocytes Relative: 18 %
NEUTROS PCT: 21 %
Neutro Abs: 0.6 10*3/uL — ABNORMAL LOW (ref 1.7–7.7)
Platelets: 198 10*3/uL (ref 150–400)
RBC: 2.75 MIL/uL — AB (ref 3.87–5.11)
RDW: 15.3 % (ref 11.5–15.5)
WBC: 2.9 10*3/uL — AB (ref 4.0–10.5)

## 2018-04-26 LAB — LACTATE DEHYDROGENASE: LDH: 151 U/L (ref 98–192)

## 2018-04-27 ENCOUNTER — Other Ambulatory Visit: Payer: Self-pay

## 2018-04-27 ENCOUNTER — Inpatient Hospital Stay (HOSPITAL_BASED_OUTPATIENT_CLINIC_OR_DEPARTMENT_OTHER): Payer: Medicare Other | Admitting: Hematology

## 2018-04-27 ENCOUNTER — Inpatient Hospital Stay (HOSPITAL_COMMUNITY): Payer: Medicare Other | Attending: Oncology

## 2018-04-27 ENCOUNTER — Encounter (HOSPITAL_COMMUNITY): Payer: Self-pay | Admitting: Hematology

## 2018-04-27 VITALS — BP 115/47 | HR 69 | Temp 98.6°F | Resp 16 | Wt 182.4 lb

## 2018-04-27 DIAGNOSIS — M8580 Other specified disorders of bone density and structure, unspecified site: Secondary | ICD-10-CM

## 2018-04-27 DIAGNOSIS — C9000 Multiple myeloma not having achieved remission: Secondary | ICD-10-CM

## 2018-04-27 DIAGNOSIS — D72819 Decreased white blood cell count, unspecified: Secondary | ICD-10-CM

## 2018-04-27 DIAGNOSIS — E538 Deficiency of other specified B group vitamins: Secondary | ICD-10-CM

## 2018-04-27 DIAGNOSIS — Z5112 Encounter for antineoplastic immunotherapy: Secondary | ICD-10-CM | POA: Insufficient documentation

## 2018-04-27 DIAGNOSIS — Z5111 Encounter for antineoplastic chemotherapy: Secondary | ICD-10-CM

## 2018-04-27 DIAGNOSIS — Z79899 Other long term (current) drug therapy: Secondary | ICD-10-CM | POA: Diagnosis not present

## 2018-04-27 NOTE — Patient Instructions (Signed)
[place] at [facility] Discharge Instructions  DO NOT TAKE Revlimid until we tell you to start it again.  We will see you next week for follow up, labs and injections.   Thank you for choosing [place] at [facility] to provide your oncology and hematology care.  To afford each patient quality time with our provider, please arrive at least 15 minutes before your scheduled appointment time.   If you have a lab appointment with the [facility] please come in thru the  Main Entrance and check in at the main information desk  You need to re-schedule your appointment should you arrive 10 or more minutes late.  We strive to give you quality time with our providers, and arriving late affects you and other patients whose appointments are after yours.  Also, if you no show three or more times for appointments you may be dismissed from the clinic at the providers discretion.     Again, thank you for choosing [facility].  Our hope is that these requests will decrease the amount of time that you wait before being seen by our physicians.       _____________________________________________________________  Should you have questions after your visit to [facility], please contact our office at [phone] between the hours of 8:30 a.m. and 4:30 p.m.  Voicemails left after 4:30 p.m. will not be returned until the following business day.  For prescription refill requests, have your pharmacy contact our office.       Resources For Cancer Patients and their Caregivers ? American Cancer Society: Can assist with transportation, wigs, general needs, runs Look Good Feel Better.        ([phone] ? Cancer Care: Provides financial assistance, online support groups, medication/co-pay assistance.  1-800-813-HOPE 6407928851) ? [place] Assists [place] cancer patients and their families through emotional  , educational and financial support.  559-747-9119 ? [place] DSS Where to apply for food stamps, Medicaid and utility assistance. ([phone] 395-4343 ? [employer]: Transportation to medical appointments. 913-185-8907 ? Social Security Administration: May apply for disability if have a Stage IV cancer. 713-363-2422 364-008-2081 ? [employer], Disability and Transit Services: Assists with nutrition, care and transit needs. [place] Support Programs:   > Cancer Support Group  2nd Tuesday of the month 1pm-2pm, Journey Room   > Creative Journey  3rd Tuesday of the month 1130am-1pm, Journey Room

## 2018-04-27 NOTE — Assessment & Plan Note (Signed)
1.  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: -Bone marrow biopsy on 01/03/2018 with 60% plasma cells, FISH panel with no abnormalities, chromosome analysis showing hyperdiploidy with gains of chromosomes 2, 3, 4, 7, 10, 15, beta-2 microglobulin 3.2, LDH normal, 0.9 g/dL of M spike at diagnosis, free light chain ratio of 114, Kappa Light chain of 1796 -Skeletal survey on 02/16/2018 showing subtle patchy areas of osteopenia of the thoracolumbar spine and possibly distal right clavicle which may reflect subtle changes of multiple myeloma - RVD cycle 1 started on 01/26/2018, cycle 4 on 04/13/2018, she is asynchronous with Revlimid and Velcade.  Today is cycle 4-day 15.  She reports starting new bottle of Revlimid 2 days ago.  Her ANC is down to 600.  I have told her to hold Revlimid.  I will hold her Velcade today.  She reports that she is taking 1 tablet of dexamethasone on Wednesdays.  She is not sure about it.  We have told her to bring all the medication bottles at next visit next week.  She is also not sure if she is taking acyclovir.  She definitely takes aspirin.  She is also taking calcium pills.  She does not seem to have good understanding of her disease process.  2.  Bone strengthening: She is on monthly denosumab injections.  Her last denosumab was on 04/13/2018.

## 2018-04-27 NOTE — Progress Notes (Signed)
[place]  Patient Care Team: Vesta Mixer as PCP - General (Physician Assistant)  DIAGNOSIS:  Encounter Diagnoses  Name Primary?  . Multiple myeloma not having achieved remission ([place]) Yes  . Vitamin B12 deficiency   . Encounter for antineoplastic chemotherapy     SUMMARY OF ONCOLOGIC HISTORY:  No history exists.    CHIEF COMPLIANT: IgA kappa multiple myeloma   INTERVAL HISTORY: Brittany Archer is a 77 y.o. female here for follow-up for multiple myeloma.    Here today with her sister.   Due for cycle #4, day15 Velcade today.  Remains on Revlimid 25 mg and Decadron. She appears confused on when she takes her Revlimid and when/how much of her Decadron she is taking.  It sounds like she has been taking the Decadron once daily.  It also sounds like she is not taking her Acyclovir correctly either.   Last skeletal survey on 02/16/18 showed subtle patchy areas of osteopenia of thoracolumbar spine and possibly distal (R) clavicle that may reflect subtle changes of multiple myeloma; otherwise skeletal survey was negative.   She is also receiving Xgeva monthly, as well as monthly vitamin B12 injections.   Her WBCs are low; recommended she stop Revlimid for now. Will plan to recheck labs next week.  Strongly recommended she bring all of her medications next week so that we can review; it appears that she has some confusion about how to take her medications.    No fevers, infections, diarrhea, constipation, or blood in her stools. Denies peripheral neuropathy. Reports that her appetite is good.   Will hold Velcade today.      REVIEW OF SYSTEMS:   Constitutional: Denies fevers, chills or abnormal weight loss Eyes: Denies blurriness of vision Ears, nose, mouth, throat, and face: Denies mucositis or sore throat Respiratory: Denies cough, dyspnea or wheezes Cardiovascular: Denies palpitation, chest discomfort Gastrointestinal:  Denies nausea, heartburn or  change in bowel habits Skin: Denies abnormal skin rashes Lymphatics: Denies new lymphadenopathy or easy bruising Neurological:Denies numbness, tingling or new weaknesses Behavioral/Psych: Mood is stable, no new changes  Extremities: No lower extremity edema All other systems were reviewed with the patient and are negative.  I have reviewed the past medical history, past surgical history, social history and family history with the patient and they are unchanged from previous note.  ALLERGIES:  is allergic to motrin [ibuprofen].  MEDICATIONS:  Current Outpatient Medications  Medication Sig Dispense Refill  . acetaminophen (TYLENOL) 500 MG tablet Take 500 mg by mouth every 6 (six) hours as needed for mild pain or moderate pain.    Marland Kitchen acyclovir (ZOVIRAX) 400 MG tablet Take 1 tablet (400 mg total) by mouth 2 (two) times daily. 60 tablet 3  . aspirin 81 MG tablet Take 81 mg by mouth daily.      . bortezomib IV (VELCADE) 3.5 MG injection Inject into the vein once. weekly    . cholecalciferol (VITAMIN D) 1000 units tablet Take 1,000 Units by mouth daily.    . Denosumab (XGEVA [place]) Inject into the skin. Every 28 days    . dexamethasone (DECADRON) 4 MG tablet Take 10 tablets (40 mg) on days 1, 8, and 15 of chemo. Repeat every 21 days. 30 tablet 3  . glipiZIDE (GLUCOTROL) 5 MG tablet     . lenalidomide (REVLIMID) 25 MG capsule Take one capsule daily on days 1-14 every 21 days. 14 capsule 0  . lisinopril-hydrochlorothiazide (PRINZIDE,ZESTORETIC) 20-25 MG tablet     .  metFORMIN (GLUCOPHAGE) 1000 MG tablet Take 1,000 mg by mouth 2 times daily at 12 noon and 4 pm.      . ondansetron (ZOFRAN) 8 MG tablet Take 1 tablet (8 mg total) by mouth 2 (two) times daily as needed (Nausea or vomiting). 30 tablet 1  . potassium chloride SA (K-DUR,KLOR-CON) 20 MEQ tablet Take 1 tablet (20 mEq total) by mouth daily. 30 tablet 3  . prochlorperazine (COMPAZINE) 10 MG tablet Take 1 tablet (10 mg total) by mouth every 6  (six) hours as needed (Nausea or vomiting). 30 tablet 1   No current facility-administered medications for this visit.     PHYSICAL EXAMINATION: ECOG PERFORMANCE STATUS: 1 - Symptomatic but completely ambulatory  Vitals:   04/27/18 1003  BP: (!) 115/47  Pulse: 69  Resp: 16  Temp: 98.6 F (37 C)  SpO2: 99%   Filed Weights   04/27/18 1003  Weight: 182 lb 6.4 oz (82.7 kg)    GENERAL:alert, no distress and comfortable SKIN: skin color, texture, turgor are normal, no rashes or significant lesions EYES: normal, Conjunctiva are pink and non-injected, sclera clear OROPHARYNX:no mucositis, no erythema and lips, buccal mucosa, and tongue normal  LYMPH:  no palpable lymphadenopathy in the cervical, axillary or inguinal LUNGS: clear to auscultation and percussion with normal breathing effort HEART: regular rate & rhythm and no murmurs and no lower extremity edema ABDOMEN:abdomen soft, non-tender and normal bowel sounds MUSCULOSKELETAL:no cyanosis of digits and no clubbing  EXTREMITIES: No lower extremity edema   LABORATORY DATA:  I have reviewed the data as listed CMP Latest Ref Rng & Units 04/26/2018 04/21/2018 04/13/2018  Glucose 65 - 99 mg/dL 60(L) 102(H) 132(H)  BUN 6 - 20 mg/dL 17 15 20  Creatinine 0.44 - 1.00 mg/dL 0.99 0.96 1.14(H)  Sodium 135 - 145 mmol/L 137 141 139  Potassium 3.5 - 5.1 mmol/L 3.5 3.0(L) 3.2(L)  Chloride 101 - 111 mmol/L 103 103 105  CO2 22 - 32 mmol/L 24 26 24  Calcium 8.9 - 10.3 mg/dL 9.1 8.9 9.1  Total Protein 6.5 - 8.1 g/dL 7.5 7.3 7.1  Total Bilirubin 0.3 - 1.2 mg/dL 0.8 0.9 0.3  Alkaline Phos 38 - 126 U/L 68 68 71  AST 15 - 41 U/L 15 16 17  ALT 14 - 54 U/L 11(L) 14 12(L)   No results found for: CAN153   Lab Results  Component Value Date   WBC 2.9 (L) 04/26/2018   HGB 9.1 (L) 04/26/2018   HCT 27.9 (L) 04/26/2018   MCV 101.5 (H) 04/26/2018   PLT 198 04/26/2018   NEUTROABS 0.6 (L) 04/26/2018    ASSESSMENT & PLAN:  Multiple myeloma not  having achieved remission ([facility]) 1.  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: -Bone marrow biopsy on 01/03/2018 with 60% plasma cells, FISH panel with no abnormalities, chromosome analysis showing hyperdiploidy with gains of chromosomes 2, 3, 4, 7, 10, 15, beta-2 microglobulin 3.2, LDH normal, 0.9 g/dL of M spike at diagnosis, free light chain ratio of 114, Kappa Light chain of 1796 -Skeletal survey on 02/16/2018 showing subtle patchy areas of osteopenia of the thoracolumbar spine and possibly distal right clavicle which may reflect subtle changes of multiple myeloma - RVD cycle 1 started on 01/26/2018, cycle 4 on 04/13/2018, she is asynchronous with Revlimid and Velcade.  Today is cycle 4-day 15.  She reports starting new bottle of Revlimid 2 days ago.  Her ANC is down to 600.  I   have told her to hold Revlimid.  I will hold her Velcade today.  She reports that she is taking 1 tablet of dexamethasone on Wednesdays.  She is not sure about it.  We have told her to bring all the medication bottles at next visit next week.  She is also not sure if she is taking acyclovir.  She definitely takes aspirin.  She is also taking calcium pills.  She does not seem to have good understanding of her disease process.  2.  Bone strengthening: She is on monthly denosumab injections.  Her last denosumab was on 04/13/2018.   The patient has a good understanding of the overall plan. she agrees with it. she will call with any problems that may develop before the next visit here.  This note includes documentation from Mike Craze, NP, who was present during this patient's office visit and evaluation.  I have reviewed this note for its completeness and accuracy.  I have edited this note accordingly based on my findings and medical opinion.      Derek Jack, MD 04/27/18

## 2018-05-06 ENCOUNTER — Inpatient Hospital Stay (HOSPITAL_BASED_OUTPATIENT_CLINIC_OR_DEPARTMENT_OTHER): Payer: Medicare Other | Admitting: Adult Health

## 2018-05-06 ENCOUNTER — Inpatient Hospital Stay (HOSPITAL_COMMUNITY): Payer: Medicare Other

## 2018-05-06 ENCOUNTER — Encounter (HOSPITAL_COMMUNITY): Payer: Self-pay | Admitting: Hematology

## 2018-05-06 ENCOUNTER — Other Ambulatory Visit: Payer: Self-pay

## 2018-05-06 VITALS — BP 122/61 | HR 76 | Temp 98.2°F | Resp 16 | Wt 179.3 lb

## 2018-05-06 DIAGNOSIS — C9000 Multiple myeloma not having achieved remission: Secondary | ICD-10-CM | POA: Diagnosis not present

## 2018-05-06 DIAGNOSIS — Z79899 Other long term (current) drug therapy: Secondary | ICD-10-CM

## 2018-05-06 DIAGNOSIS — Z5112 Encounter for antineoplastic immunotherapy: Secondary | ICD-10-CM | POA: Diagnosis not present

## 2018-05-06 LAB — CBC WITH DIFFERENTIAL/PLATELET
Basophils Absolute: 0 10*3/uL (ref 0.0–0.1)
Basophils Relative: 0 %
EOS ABS: 0 10*3/uL (ref 0.0–0.7)
Eosinophils Relative: 1 %
HCT: 31.1 % — ABNORMAL LOW (ref 36.0–46.0)
Hemoglobin: 10.2 g/dL — ABNORMAL LOW (ref 12.0–15.0)
LYMPHS ABS: 1.1 10*3/uL (ref 0.7–4.0)
LYMPHS PCT: 22 %
MCH: 33.1 pg (ref 26.0–34.0)
MCHC: 32.8 g/dL (ref 30.0–36.0)
MCV: 101 fL — ABNORMAL HIGH (ref 78.0–100.0)
MONOS PCT: 5 %
Monocytes Absolute: 0.2 10*3/uL (ref 0.1–1.0)
Neutro Abs: 3.6 10*3/uL (ref 1.7–7.7)
Neutrophils Relative %: 72 %
PLATELETS: 352 10*3/uL (ref 150–400)
RBC: 3.08 MIL/uL — AB (ref 3.87–5.11)
RDW: 15.5 % (ref 11.5–15.5)
WBC: 4.9 10*3/uL (ref 4.0–10.5)

## 2018-05-06 LAB — COMPREHENSIVE METABOLIC PANEL
ALT: 10 U/L — ABNORMAL LOW (ref 14–54)
ANION GAP: 8 (ref 5–15)
AST: 15 U/L (ref 15–41)
Albumin: 3.9 g/dL (ref 3.5–5.0)
Alkaline Phosphatase: 66 U/L (ref 38–126)
BUN: 20 mg/dL (ref 6–20)
CO2: 25 mmol/L (ref 22–32)
Calcium: 9.1 mg/dL (ref 8.9–10.3)
Chloride: 107 mmol/L (ref 101–111)
Creatinine, Ser: 1.04 mg/dL — ABNORMAL HIGH (ref 0.44–1.00)
GFR calc non Af Amer: 50 mL/min — ABNORMAL LOW (ref >60)
GFR, EST AFRICAN AMERICAN: 58 mL/min — AB (ref >60)
Glucose, Bld: 115 mg/dL — ABNORMAL HIGH (ref 65–99)
POTASSIUM: 3.9 mmol/L (ref 3.5–5.1)
SODIUM: 140 mmol/L (ref 135–145)
Total Bilirubin: 0.6 mg/dL (ref 0.3–1.2)
Total Protein: 8.2 g/dL — ABNORMAL HIGH (ref 6.5–8.1)

## 2018-05-06 LAB — LACTATE DEHYDROGENASE: LDH: 162 U/L (ref 98–192)

## 2018-05-06 MED ORDER — PROCHLORPERAZINE MALEATE 10 MG PO TABS
ORAL_TABLET | ORAL | Status: AC
  Filled 2018-05-06: qty 1

## 2018-05-06 MED ORDER — BORTEZOMIB CHEMO SQ INJECTION 3.5 MG (2.5MG/ML)
1.3000 mg/m2 | Freq: Once | INTRAMUSCULAR | Status: AC
  Administered 2018-05-06: 2.5 mg via SUBCUTANEOUS
  Filled 2018-05-06: qty 2.5

## 2018-05-06 MED ORDER — ACYCLOVIR 400 MG PO TABS: 400.0000 mg | ORAL_TABLET | Freq: Two times a day (BID) | ORAL | 3 refills | Status: DC

## 2018-05-06 MED ORDER — PROCHLORPERAZINE MALEATE 10 MG PO TABS
10.0000 mg | ORAL_TABLET | Freq: Once | ORAL | Status: AC
  Administered 2018-05-06: 10 mg via ORAL

## 2018-05-06 NOTE — Progress Notes (Signed)
Brittany Archer presents today for injection per the provider's orders.  Velcade administration without incident; see MAR for injection details.  Patient tolerated procedure well and without incident.  No questions or complaints noted at this time. Discharged ambulatory in c/o family.  

## 2018-05-06 NOTE — Progress Notes (Signed)
[facility]  Patient Care Team: Vesta Mixer as PCP - General (Physician Assistant)  DIAGNOSIS:  Encounter Diagnoses  Name Primary?  . Multiple myeloma not having achieved remission ([place]) Yes  . Polypharmacy     SUMMARY OF ONCOLOGIC HISTORY:  No history exists.    CHIEF COMPLIANT: IgA kappa multiple myeloma   INTERVAL HISTORY: Brittany Archer is a 77 y.o. female here for follow-up for multiple myeloma.    Here today with her sister.   Appetite and energy levels 75%. She has not taken her Revlimid since she was told to hold her medications last week at her visit with Dr. Delton Coombes (d/t concerns regarding adherence to medication regimen).  She reports that she did take her "10 pills" this morning (she is referring to the Dexamethasone).    She brought all of her pill bottles with her today and we will review her oncologic medications together today.   Otherwise, she is largely without other complaints today.     REVIEW OF SYSTEMS:     I have reviewed the past medical history, past surgical history, social history and family history with the patient and they are unchanged from previous note.  ALLERGIES:  is allergic to motrin [ibuprofen].  MEDICATIONS:  Current Outpatient Medications  Medication Sig Dispense Refill  . acetaminophen (TYLENOL) 500 MG tablet Take 500 mg by mouth every 6 (six) hours as needed for mild pain or moderate pain.    Marland Kitchen acyclovir (ZOVIRAX) 400 MG tablet Take 1 tablet (400 mg total) by mouth 2 (two) times daily. 60 tablet 3  . aspirin 81 MG tablet Take 81 mg by mouth daily.      . bortezomib IV (VELCADE) 3.5 MG injection Inject into the vein once. weekly    . cholecalciferol (VITAMIN D) 1000 units tablet Take 1,000 Units by mouth daily.    . Denosumab (XGEVA ) Inject into the skin. Every 28 days    . dexamethasone (DECADRON) 4 MG tablet Take 10 tablets (40 mg) on days 1, 8, and 15 of chemo. Repeat every 21 days. 30 tablet 3   . glipiZIDE (GLUCOTROL) 5 MG tablet     . lenalidomide (REVLIMID) 25 MG capsule Take one capsule daily on days 1-14 every 21 days. 14 capsule 0  . lisinopril-hydrochlorothiazide (PRINZIDE,ZESTORETIC) 20-25 MG tablet     . metFORMIN (GLUCOPHAGE) 1000 MG tablet Take 1,000 mg by mouth 2 times daily at 12 noon and 4 pm.      . ondansetron (ZOFRAN) 8 MG tablet Take 1 tablet (8 mg total) by mouth 2 (two) times daily as needed (Nausea or vomiting). 30 tablet 1  . potassium chloride SA (K-DUR,KLOR-CON) 20 MEQ tablet Take 1 tablet (20 mEq total) by mouth daily. 30 tablet 3  . prochlorperazine (COMPAZINE) 10 MG tablet Take 1 tablet (10 mg total) by mouth every 6 (six) hours as needed (Nausea or vomiting). 30 tablet 1   No current facility-administered medications for this visit.     PHYSICAL EXAMINATION: ECOG PERFORMANCE STATUS: 1 - Symptomatic but completely ambulatory  Vitals:   05/06/18 1041  BP: 122/61  Pulse: 76  Resp: 16  Temp: 98.2 F (36.8 C)  SpO2: 98%   Filed Weights   05/06/18 1041  Weight: 179 lb 4.8 oz (81.3 kg)    Physical Exam  Constitutional: She is well-developed, well-nourished, and in no distress.  Eyes: Conjunctivae are normal. No scleral icterus.  Neck: Normal range of motion.  Pulmonary/Chest:  Effort normal.  Musculoskeletal: Normal range of motion.  Neurological: She is alert.  Psychiatric: Mood and affect normal.  Nursing note and vitals reviewed.     LABORATORY DATA:  I have reviewed the data as listed CMP Latest Ref Rng & Units 05/06/2018 04/26/2018 04/21/2018  Glucose 65 - 99 mg/dL 115(H) 60(L) 102(H)  BUN 6 - 20 mg/dL '20 17 15  ' Creatinine 0.44 - 1.00 mg/dL 1.04(H) 0.99 0.96  Sodium 135 - 145 mmol/L 140 137 141  Potassium 3.5 - 5.1 mmol/L 3.9 3.5 3.0(L)  Chloride 101 - 111 mmol/L 107 103 103  CO2 22 - 32 mmol/L '25 24 26  ' Calcium 8.9 - 10.3 mg/dL 9.1 9.1 8.9  Total Protein 6.5 - 8.1 g/dL 8.2(H) 7.5 7.3  Total Bilirubin 0.3 - 1.2 mg/dL 0.6 0.8 0.9    Alkaline Phos 38 - 126 U/L 66 68 68  AST 15 - 41 U/L '15 15 16  ' ALT 14 - 54 U/L 10(L) 11(L) 14   No results found for: [id number]   Lab Results  Component Value Date   WBC 4.9 05/06/2018   HGB 10.2 (L) 05/06/2018   HCT 31.1 (L) 05/06/2018   MCV 101.0 (H) 05/06/2018   PLT 352 05/06/2018   NEUTROABS 3.6 05/06/2018    ASSESSMENT & PLAN:  No problem-specific Assessment & Plan notes found for this encounter.   Medication Reconciliation: -Spent about 20 minutes with patient today reconciling her oncologic-related medications including Revlimid, Dexamethasone, and Acyclovir.    -It is difficult for her to tell me how she takes her medications.  She did bring all of her medications today to review together. Below are my findings and recommendations;   *Revlimid: Appears she has been taking her Revlimid daily for 14 days as directed (she does have about 12 pills in her bottle today because Dr. Delton Coombes told her not to take any of her medications until we were able to review them together today.  She and her sister state that the speciality pharmacy gives her a calendar and also reviews with her how to take her medications.  She has used pill bottles in the past; unclear if she uses them consistently at this time.  Recommended she resume Revlimid today once daily to complete this cycle of therapy.  Reinforced the importance of waiting 7 days before starting the next cycle (and subsequent cycles in the future).    *Dexamethasone: Initially she tells me that she takes this medication once daily.  Then, she is able to tell me that she takes 10 pills (40 mg) "on the day I come up here for my shot."  She denies taking Decadron any other days of the week, which is encouraging.  Reinforced the importance of taking the Decadron once per week; she has been taking on the same day as her Velcade and this is excellent.  Recommended she continue to do so.    *Acyclovir:  She has not been taking this medication at  all for unknown amount of time.  She is not clear if "I ever took a shingles pill before."  Her sister tells me that she did take the "shingles pill" initially when she first started her treatment, "but I'm not sure if she ever got any refills after she finished taking that bottle."  If this is accurate, she may not have had Acyclovir since 01/2018 (initial prescription was given by Dr. Ulyses Amor medical oncologist in 12/2017).  Reinforced the importance of Acyclovir in VZV prophylaxis with  active treatment for her myeloma.  A new prescription for the Acyclovir was e-scribed to [facility] today.  Instructed to her to pick up this medication and start it today.    She voiced understanding of her oncologic medications and voiced no additional questions.  She will receive Velcade injection today as scheduled.    The patient has a good understanding of the overall plan. she agrees with it. she will call with any problems that may develop before the next visit here.    Dispo:  -Continue weekly Velcade  -I will have her follow-up with Dr. Delton Coombes again in 2 weeks to ensure continued tolerance and adherence to her chemotherapy regimen.    A total of 20 minutes was spent in face-to-face care of this patient, with greater than 50% of that time spent in counseling and care-coordination.     Mike Craze, NP [facility] [phone]

## 2018-05-11 ENCOUNTER — Encounter (HOSPITAL_COMMUNITY): Payer: Self-pay

## 2018-05-11 ENCOUNTER — Other Ambulatory Visit (HOSPITAL_COMMUNITY): Payer: Self-pay | Admitting: Emergency Medicine

## 2018-05-11 ENCOUNTER — Inpatient Hospital Stay (HOSPITAL_COMMUNITY): Payer: Medicare Other

## 2018-05-11 VITALS — BP 122/56 | HR 75 | Temp 98.1°F | Resp 18

## 2018-05-11 DIAGNOSIS — C9000 Multiple myeloma not having achieved remission: Secondary | ICD-10-CM

## 2018-05-11 DIAGNOSIS — Z5112 Encounter for antineoplastic immunotherapy: Secondary | ICD-10-CM | POA: Diagnosis not present

## 2018-05-11 DIAGNOSIS — E538 Deficiency of other specified B group vitamins: Secondary | ICD-10-CM

## 2018-05-11 LAB — COMPREHENSIVE METABOLIC PANEL
ALBUMIN: 3.8 g/dL (ref 3.5–5.0)
ALK PHOS: 76 U/L (ref 38–126)
ALT: 12 U/L — ABNORMAL LOW (ref 14–54)
ANION GAP: 10 (ref 5–15)
AST: 16 U/L (ref 15–41)
BILIRUBIN TOTAL: 0.7 mg/dL (ref 0.3–1.2)
BUN: 18 mg/dL (ref 6–20)
CALCIUM: 9.4 mg/dL (ref 8.9–10.3)
CO2: 23 mmol/L (ref 22–32)
Chloride: 100 mmol/L — ABNORMAL LOW (ref 101–111)
Creatinine, Ser: 0.97 mg/dL (ref 0.44–1.00)
GFR calc non Af Amer: 55 mL/min — ABNORMAL LOW (ref >60)
GLUCOSE: 190 mg/dL — AB (ref 65–99)
POTASSIUM: 3.6 mmol/L (ref 3.5–5.1)
SODIUM: 133 mmol/L — AB (ref 135–145)
Total Protein: 7.8 g/dL (ref 6.5–8.1)

## 2018-05-11 LAB — CBC WITH DIFFERENTIAL/PLATELET
BASOS PCT: 0 %
Basophils Absolute: 0 10*3/uL (ref 0.0–0.1)
EOS ABS: 0 10*3/uL (ref 0.0–0.7)
Eosinophils Relative: 0 %
HCT: 30.6 % — ABNORMAL LOW (ref 36.0–46.0)
Hemoglobin: 10.3 g/dL — ABNORMAL LOW (ref 12.0–15.0)
LYMPHS ABS: 0.7 10*3/uL (ref 0.7–4.0)
Lymphocytes Relative: 11 %
MCH: 33.4 pg (ref 26.0–34.0)
MCHC: 33.7 g/dL (ref 30.0–36.0)
MCV: 99.4 fL (ref 78.0–100.0)
MONO ABS: 0.1 10*3/uL (ref 0.1–1.0)
MONOS PCT: 2 %
Neutro Abs: 5.7 10*3/uL (ref 1.7–7.7)
Neutrophils Relative %: 87 %
Platelets: 209 10*3/uL (ref 150–400)
RBC: 3.08 MIL/uL — ABNORMAL LOW (ref 3.87–5.11)
RDW: 15.5 % (ref 11.5–15.5)
WBC: 6.5 10*3/uL (ref 4.0–10.5)

## 2018-05-11 LAB — LACTATE DEHYDROGENASE: LDH: 124 U/L (ref 98–192)

## 2018-05-11 MED ORDER — PROCHLORPERAZINE MALEATE 10 MG PO TABS
ORAL_TABLET | ORAL | Status: AC
  Filled 2018-05-11: qty 1

## 2018-05-11 MED ORDER — PROCHLORPERAZINE MALEATE 10 MG PO TABS
10.0000 mg | ORAL_TABLET | Freq: Once | ORAL | Status: AC
  Administered 2018-05-11: 10 mg via ORAL

## 2018-05-11 MED ORDER — DENOSUMAB 120 MG/1.7ML ~~LOC~~ SOLN
120.0000 mg | Freq: Once | SUBCUTANEOUS | Status: AC
Start: 2018-05-11 — End: 2018-05-11
  Administered 2018-05-11: 120 mg via SUBCUTANEOUS
  Filled 2018-05-11: qty 1.7

## 2018-05-11 MED ORDER — LENALIDOMIDE 25 MG PO CAPS: ORAL_CAPSULE | ORAL | 0 refills | Status: DC

## 2018-05-11 MED ORDER — CYANOCOBALAMIN 1000 MCG/ML IJ SOLN
1000.0000 ug | Freq: Once | INTRAMUSCULAR | Status: AC
  Administered 2018-05-11: 1000 ug via INTRAMUSCULAR
  Filled 2018-05-11: qty 1

## 2018-05-11 MED ORDER — BORTEZOMIB CHEMO SQ INJECTION 3.5 MG (2.5MG/ML)
1.3000 mg/m2 | Freq: Once | INTRAMUSCULAR | Status: AC
  Administered 2018-05-11: 2.5 mg via SUBCUTANEOUS
  Filled 2018-05-11: qty 2.5

## 2018-05-11 NOTE — Patient Instructions (Signed)
[facility] Discharge Instructions for Patients Receiving Chemotherapy  Today you received the following chemotherapy agents velcade, xgeva, and b12 shot.  If you develop nausea and vomiting that is not controlled by your nausea medication, call the clinic.   BELOW ARE SYMPTOMS THAT SHOULD BE REPORTED IMMEDIATELY:  *FEVER GREATER THAN 100.5 F  *CHILLS WITH OR WITHOUT FEVER  NAUSEA AND VOMITING THAT IS NOT CONTROLLED WITH YOUR NAUSEA MEDICATION  *UNUSUAL SHORTNESS OF BREATH  *UNUSUAL BRUISING OR BLEEDING  TENDERNESS IN MOUTH AND THROAT WITH OR WITHOUT PRESENCE OF ULCERS  *URINARY PROBLEMS  *BOWEL PROBLEMS  UNUSUAL RASH Items with * indicate a potential emergency and should be followed up as soon as possible.  Feel free to call the clinic should you have any questions or concerns. The clinic phone number is [phone].  Please show the [facility] at check-in to the Emergency Department and triage nurse.

## 2018-05-11 NOTE — Progress Notes (Signed)
revlimid refilled

## 2018-05-11 NOTE — Progress Notes (Signed)
Patient taking calcium  Bid as directed.  Denied tooth, jaw, or leg pain.  No recent or upcoming dental visits.  No other complaints voiced.  Patient tolerated injections with no complaints voiced.  All sites clean and dry with no bruising or swelling noted at sites.  Band aids applied.  VSs with discharge and left ambulatory with family with no s/s of distress noted.

## 2018-05-12 LAB — KAPPA/LAMBDA LIGHT CHAINS
Kappa free light chain: 44 mg/L — ABNORMAL HIGH (ref 3.3–19.4)
Kappa, lambda light chain ratio: 1.74 — ABNORMAL HIGH (ref 0.26–1.65)
Lambda free light chains: 25.3 mg/L (ref 5.7–26.3)

## 2018-05-12 LAB — IGG, IGA, IGM
IGA: 466 mg/dL — AB (ref 64–422)
IGM (IMMUNOGLOBULIN M), SRM: 88 mg/dL (ref 26–217)
IgG (Immunoglobin G), Serum: 1479 mg/dL (ref 700–1600)

## 2018-05-13 LAB — PROTEIN ELECTROPHORESIS, SERUM
A/G Ratio: 1 (ref 0.7–1.7)
Albumin ELP: 3.6 g/dL (ref 2.9–4.4)
Alpha-1-Globulin: 0.2 g/dL (ref 0.0–0.4)
Alpha-2-Globulin: 0.8 g/dL (ref 0.4–1.0)
Beta Globulin: 1.2 g/dL (ref 0.7–1.3)
GLOBULIN, TOTAL: 3.6 g/dL (ref 2.2–3.9)
Gamma Globulin: 1.4 g/dL (ref 0.4–1.8)
M-SPIKE, %: 0.5 g/dL — AB
TOTAL PROTEIN ELP: 7.2 g/dL (ref 6.0–8.5)

## 2018-05-13 LAB — BETA 2 MICROGLOBULIN, SERUM: BETA 2 MICROGLOBULIN: 1.9 mg/L (ref 0.6–2.4)

## 2018-05-18 ENCOUNTER — Other Ambulatory Visit (HOSPITAL_COMMUNITY): Payer: Self-pay

## 2018-05-18 DIAGNOSIS — Z5111 Encounter for antineoplastic chemotherapy: Secondary | ICD-10-CM

## 2018-05-18 DIAGNOSIS — D472 Monoclonal gammopathy: Secondary | ICD-10-CM

## 2018-05-18 DIAGNOSIS — C9000 Multiple myeloma not having achieved remission: Secondary | ICD-10-CM

## 2018-05-20 ENCOUNTER — Encounter (HOSPITAL_COMMUNITY): Payer: Self-pay

## 2018-05-20 ENCOUNTER — Inpatient Hospital Stay (HOSPITAL_COMMUNITY): Payer: Medicare Other

## 2018-05-20 ENCOUNTER — Inpatient Hospital Stay (HOSPITAL_COMMUNITY): Payer: Medicare Other | Attending: Hematology

## 2018-05-20 VITALS — BP 113/52 | HR 63 | Temp 98.2°F | Resp 18 | Wt 177.6 lb

## 2018-05-20 DIAGNOSIS — C9000 Multiple myeloma not having achieved remission: Secondary | ICD-10-CM

## 2018-05-20 DIAGNOSIS — Z5111 Encounter for antineoplastic chemotherapy: Secondary | ICD-10-CM

## 2018-05-20 DIAGNOSIS — Z5112 Encounter for antineoplastic immunotherapy: Secondary | ICD-10-CM | POA: Diagnosis not present

## 2018-05-20 DIAGNOSIS — D472 Monoclonal gammopathy: Secondary | ICD-10-CM

## 2018-05-20 LAB — COMPREHENSIVE METABOLIC PANEL
ALT: 14 U/L (ref 14–54)
AST: 17 U/L (ref 15–41)
Albumin: 3.8 g/dL (ref 3.5–5.0)
Alkaline Phosphatase: 61 U/L (ref 38–126)
Anion gap: 9 (ref 5–15)
BUN: 22 mg/dL — AB (ref 6–20)
CHLORIDE: 102 mmol/L (ref 101–111)
CO2: 23 mmol/L (ref 22–32)
CREATININE: 0.96 mg/dL (ref 0.44–1.00)
Calcium: 8.5 mg/dL — ABNORMAL LOW (ref 8.9–10.3)
GFR calc Af Amer: >60 mL/min (ref >60)
GFR, EST NON AFRICAN AMERICAN: 55 mL/min — AB (ref >60)
Glucose, Bld: 193 mg/dL — ABNORMAL HIGH (ref 65–99)
Potassium: 4.4 mmol/L (ref 3.5–5.1)
Sodium: 134 mmol/L — ABNORMAL LOW (ref 135–145)
Total Bilirubin: 0.7 mg/dL (ref 0.3–1.2)
Total Protein: 7.4 g/dL (ref 6.5–8.1)

## 2018-05-20 LAB — CBC WITH DIFFERENTIAL/PLATELET
BASOS ABS: 0 10*3/uL (ref 0.0–0.1)
Basophils Relative: 0 %
Eosinophils Absolute: 0 10*3/uL (ref 0.0–0.7)
Eosinophils Relative: 0 %
HEMATOCRIT: 29.2 % — AB (ref 36.0–46.0)
HEMOGLOBIN: 9.7 g/dL — AB (ref 12.0–15.0)
LYMPHS ABS: 0.5 10*3/uL — AB (ref 0.7–4.0)
Lymphocytes Relative: 10 %
MCH: 33 pg (ref 26.0–34.0)
MCHC: 33.2 g/dL (ref 30.0–36.0)
MCV: 99.3 fL (ref 78.0–100.0)
Monocytes Absolute: 0.1 10*3/uL (ref 0.1–1.0)
Monocytes Relative: 2 %
Neutro Abs: 4.6 10*3/uL (ref 1.7–7.7)
Neutrophils Relative %: 88 %
PLATELETS: 131 10*3/uL — AB (ref 150–400)
RBC: 2.94 MIL/uL — AB (ref 3.87–5.11)
RDW: 15.8 % — ABNORMAL HIGH (ref 11.5–15.5)
WBC: 5.2 10*3/uL (ref 4.0–10.5)

## 2018-05-20 MED ORDER — BORTEZOMIB CHEMO SQ INJECTION 3.5 MG (2.5MG/ML)
1.3000 mg/m2 | Freq: Once | INTRAMUSCULAR | Status: AC
  Administered 2018-05-20: 2.5 mg via SUBCUTANEOUS
  Filled 2018-05-20: qty 1

## 2018-05-20 MED ORDER — PROCHLORPERAZINE MALEATE 10 MG PO TABS
ORAL_TABLET | ORAL | Status: AC
  Filled 2018-05-20: qty 1

## 2018-05-20 MED ORDER — PROCHLORPERAZINE MALEATE 10 MG PO TABS
10.0000 mg | ORAL_TABLET | Freq: Once | ORAL | Status: AC
  Administered 2018-05-20: 10 mg via ORAL

## 2018-05-20 NOTE — Patient Instructions (Signed)
[facility] Discharge Instructions for Patients Receiving Chemotherapy   Beginning January 23rd 2017 lab work for the Cancer Center will be done in the  Main lab at [facility] on 1st floor. If you have a lab appointment with the Cancer Center please come in thru the  Main Entrance and check in at the main information desk   Today you received the following chemotherapy agents Velcade injection. Follow-up as scheduled. Call clinic for any questions or concerns  To help prevent nausea and vomiting after your treatment, we encourage you to take your nausea medication   If you develop nausea and vomiting, or diarrhea that is not controlled by your medication, call the clinic.  The clinic phone number is [phone]. Office hours are Monday-Friday 8:30am-5:00pm.  BELOW ARE SYMPTOMS THAT SHOULD BE REPORTED IMMEDIATELY:  *FEVER GREATER THAN 101.0 F  *CHILLS WITH OR WITHOUT FEVER  NAUSEA AND VOMITING THAT IS NOT CONTROLLED WITH YOUR NAUSEA MEDICATION  *UNUSUAL SHORTNESS OF BREATH  *UNUSUAL BRUISING OR BLEEDING  TENDERNESS IN MOUTH AND THROAT WITH OR WITHOUT PRESENCE OF ULCERS  *URINARY PROBLEMS  *BOWEL PROBLEMS  UNUSUAL RASH Items with * indicate a potential emergency and should be followed up as soon as possible. If you have an emergency after office hours please contact your primary care physician or go to the nearest emergency department.  Please call the clinic during office hours if you have any questions or concerns.   You may also contact the Patient Navigator at [phone] should you have any questions or need assistance in obtaining follow up care.      Resources For Cancer Patients and their Caregivers ? American Cancer Society: Can assist with transportation, wigs, general needs, runs Look Good Feel Better.        1-888-227-6333 ? Cancer Care: Provides financial assistance, online support groups, medication/co-pay assistance.   1-800-813-HOPE (4673) ? [facility] Assists [place] cancer patients and their families through emotional , educational and financial support.  336-427-4357 ? [place] DSS Where to apply for food stamps, Medicaid and utility assistance. 336-342-1394 ? RCATS: Transportation to medical appointments. 336-347-2287 ? Social Security Administration: May apply for disability if have a Stage IV cancer. 336-342-7796 1-800-772-1213 ? [place] Aging, Disability and Transit Services: Assists with nutrition, care and transit needs. 336-349-2343         

## 2018-05-20 NOTE — Progress Notes (Signed)
Brittany Archer tolerated Velcade injection well without complaints or incident. Labs reviewed prior to administering this medication.Pt continues to take her Revlimid as prescribed without any issues. VSS Pt discharged self ambulatory in satisfactory condition accompanied by a family memebr

## 2018-05-25 ENCOUNTER — Encounter: Payer: Self-pay | Admitting: Gastroenterology

## 2018-05-27 ENCOUNTER — Inpatient Hospital Stay (HOSPITAL_COMMUNITY): Payer: Medicare Other

## 2018-05-27 ENCOUNTER — Inpatient Hospital Stay (HOSPITAL_BASED_OUTPATIENT_CLINIC_OR_DEPARTMENT_OTHER): Payer: Medicare Other | Admitting: Hematology

## 2018-05-27 ENCOUNTER — Other Ambulatory Visit: Payer: Self-pay

## 2018-05-27 ENCOUNTER — Encounter (HOSPITAL_COMMUNITY): Payer: Self-pay | Admitting: Hematology

## 2018-05-27 VITALS — BP 108/56 | HR 56 | Temp 99.0°F | Resp 18 | Wt 178.2 lb

## 2018-05-27 DIAGNOSIS — Z5111 Encounter for antineoplastic chemotherapy: Secondary | ICD-10-CM

## 2018-05-27 DIAGNOSIS — Z5112 Encounter for antineoplastic immunotherapy: Secondary | ICD-10-CM | POA: Diagnosis not present

## 2018-05-27 DIAGNOSIS — C9000 Multiple myeloma not having achieved remission: Secondary | ICD-10-CM

## 2018-05-27 DIAGNOSIS — D472 Monoclonal gammopathy: Secondary | ICD-10-CM

## 2018-05-27 LAB — CBC WITH DIFFERENTIAL/PLATELET
BASOS ABS: 0 10*3/uL (ref 0.0–0.1)
Basophils Relative: 1 %
EOS PCT: 8 %
Eosinophils Absolute: 0.2 10*3/uL (ref 0.0–0.7)
HCT: 28 % — ABNORMAL LOW (ref 36.0–46.0)
Hemoglobin: 9.2 g/dL — ABNORMAL LOW (ref 12.0–15.0)
Lymphocytes Relative: 27 %
Lymphs Abs: 0.5 10*3/uL — ABNORMAL LOW (ref 0.7–4.0)
MCH: 33.3 pg (ref 26.0–34.0)
MCHC: 32.9 g/dL (ref 30.0–36.0)
MCV: 101.4 fL — ABNORMAL HIGH (ref 78.0–100.0)
MONO ABS: 0.2 10*3/uL (ref 0.1–1.0)
Monocytes Relative: 10 %
NEUTROS ABS: 1 10*3/uL — AB (ref 1.7–7.7)
Neutrophils Relative %: 54 %
Platelets: 130 10*3/uL — ABNORMAL LOW (ref 150–400)
RBC: 2.76 MIL/uL — ABNORMAL LOW (ref 3.87–5.11)
RDW: 16.2 % — AB (ref 11.5–15.5)
WBC: 1.9 10*3/uL — ABNORMAL LOW (ref 4.0–10.5)

## 2018-05-27 LAB — COMPREHENSIVE METABOLIC PANEL
ALBUMIN: 3.7 g/dL (ref 3.5–5.0)
ALT: 9 U/L — ABNORMAL LOW (ref 14–54)
AST: 15 U/L (ref 15–41)
Alkaline Phosphatase: 57 U/L (ref 38–126)
Anion gap: 10 (ref 5–15)
BUN: 21 mg/dL — AB (ref 6–20)
CO2: 23 mmol/L (ref 22–32)
Calcium: 8.9 mg/dL (ref 8.9–10.3)
Chloride: 104 mmol/L (ref 101–111)
Creatinine, Ser: 1.08 mg/dL — ABNORMAL HIGH (ref 0.44–1.00)
GFR calc Af Amer: 55 mL/min — ABNORMAL LOW (ref >60)
GFR calc non Af Amer: 48 mL/min — ABNORMAL LOW (ref >60)
GLUCOSE: 103 mg/dL — AB (ref 65–99)
POTASSIUM: 3.9 mmol/L (ref 3.5–5.1)
Sodium: 137 mmol/L (ref 135–145)
Total Bilirubin: 1 mg/dL (ref 0.3–1.2)
Total Protein: 6.9 g/dL (ref 6.5–8.1)

## 2018-05-27 MED ORDER — BORTEZOMIB CHEMO SQ INJECTION 3.5 MG (2.5MG/ML)
1.3000 mg/m2 | Freq: Once | INTRAMUSCULAR | Status: AC
Start: 2018-05-27 — End: 2018-05-27
  Administered 2018-05-27: 2.5 mg via SUBCUTANEOUS
  Filled 2018-05-27: qty 2.5

## 2018-05-27 MED ORDER — PROCHLORPERAZINE MALEATE 10 MG PO TABS
10.0000 mg | ORAL_TABLET | Freq: Once | ORAL | Status: AC
  Administered 2018-05-27: 10 mg via ORAL

## 2018-05-27 MED ORDER — PROCHLORPERAZINE MALEATE 10 MG PO TABS
ORAL_TABLET | ORAL | Status: AC
  Filled 2018-05-27: qty 1

## 2018-05-27 NOTE — Progress Notes (Signed)
 [facility] [address] [place], [place] [address]   CLINIC:  Medical Oncology/Hematology  PCP:  Baucom, Jenny B, PA-C [address] [place]  [address] [phone]   REASON FOR VISIT:  Follow-up for multiple myeloma.  CURRENT THERAPY: Revlimid, Velcade and dexamethasone.   INTERVAL HISTORY:  Brittany Archer 77 y.o. female returns for follow-up of multiple myeloma.  She is on RVD regimen.  She has poor understanding of her disease.  She does not remember when she started her Revlimid.  She lives by herself at home.  We have made her get her medication from her car.  She has about 4 more Revlimid pills left.  She denies any diarrhea with it.  She denies any nausea or vomiting.  She denies any peripheral neuropathy.  No fevers or infections noted.  She is able to function well.  No hospitalizations.  She is taking dexamethasone 10 tablets on days of Velcade injection.  She is continuing acyclovir tablets twice daily.    REVIEW OF SYSTEMS:  Review of Systems  Constitutional: Positive for fatigue.  All other systems reviewed and are negative.    PAST MEDICAL/SURGICAL HISTORY:  Past Medical History:  Diagnosis Date  . Breast cancer (HCC)    left breast/ 2008/ surg/ rad tx  . Coronary artery disease   . Diabetes mellitus    Past Surgical History:  Procedure Laterality Date  . ABDOMINAL HYSTERECTOMY    . BREAST SURGERY       SOCIAL HISTORY:  Social History   Socioeconomic History  . Marital status: Divorced    Spouse name: Not on file  . Number of children: Not on file  . Years of education: Not on file  . Highest education level: Not on file  Occupational History  . Not on file  Social Needs  . Financial resource strain: Not on file  . Food insecurity:    Worry: Not on file    Inability: Not on file  . Transportation needs:    Medical: Not on file    Non-medical: Not on file  Tobacco Use  . Smoking status: Never Smoker  . Smokeless  tobacco: Never Used  Substance and Sexual Activity  . Alcohol use: No  . Drug use: No  . Sexual activity: Yes    Birth control/protection: Surgical  Lifestyle  . Physical activity:    Days per week: Not on file    Minutes per session: Not on file  . Stress: Not on file  Relationships  . Social connections:    Talks on phone: Not on file    Gets together: Not on file    Attends religious service: Not on file    Active member of club or organization: Not on file    Attends meetings of clubs or organizations: Not on file    Relationship status: Not on file  . Intimate partner violence:    Fear of current or ex partner: Not on file    Emotionally abused: Not on file    Physically abused: Not on file    Forced sexual activity: Not on file  Other Topics Concern  . Not on file  Social History Narrative  . Not on file    FAMILY HISTORY:  History reviewed. No pertinent family history.  CURRENT MEDICATIONS:  Outpatient Encounter Medications as of 05/27/2018  Medication Sig  . acetaminophen (TYLENOL) 500 MG tablet Take 500 mg by mouth every 6 (six) hours as needed   for mild pain or moderate pain.  . acyclovir (ZOVIRAX) 400 MG tablet Take 1 tablet (400 mg total) by mouth 2 (two) times daily.  . aspirin 81 MG tablet Take 81 mg by mouth daily.    . bortezomib IV (VELCADE) 3.5 MG injection Inject into the vein once. weekly  . cholecalciferol (VITAMIN D) 1000 units tablet Take 1,000 Units by mouth daily.  . Denosumab (XGEVA [place]) Inject into the skin. Every 28 days  . dexamethasone (DECADRON) 4 MG tablet Take 10 tablets (40 mg) on days 1, 8, and 15 of chemo. Repeat every 21 days.  . glipiZIDE (GLUCOTROL) 5 MG tablet   . lenalidomide (REVLIMID) 25 MG capsule Take one capsule daily on days 1-14 every 21 days.  . lisinopril-hydrochlorothiazide (PRINZIDE,ZESTORETIC) 20-25 MG tablet   . metFORMIN (GLUCOPHAGE) 1000 MG tablet Take 1,000 mg by mouth 2 times daily at 12 noon and 4 pm.    .  ondansetron (ZOFRAN) 8 MG tablet Take 1 tablet (8 mg total) by mouth 2 (two) times daily as needed (Nausea or vomiting).  . potassium chloride SA (K-DUR,KLOR-CON) 20 MEQ tablet Take 1 tablet (20 mEq total) by mouth daily.  . prochlorperazine (COMPAZINE) 10 MG tablet Take 1 tablet (10 mg total) by mouth every 6 (six) hours as needed (Nausea or vomiting).   No facility-administered encounter medications on file as of 05/27/2018.     ALLERGIES:  Allergies  Allergen Reactions  . Motrin [Ibuprofen] Rash     PHYSICAL EXAM:  ECOG Performance status: 1  Vitals:   05/27/18 0855  BP: (!) 108/56  Pulse: (!) 56  Resp: 18  Temp: 99 F (37.2 C)  SpO2: 100%   Filed Weights   05/27/18 0855  Weight: 178 lb 3.2 oz (80.8 kg)    Physical Exam HEENT shows no mucositis or thrush. Chest is bilaterally clear to auscultation. Cardiovascular S1-S2 regular rate and rhythm. Extremities no edema or cyanosis.  LABORATORY DATA:  I have reviewed the labs as listed.  CBC    Component Value Date/Time   WBC 1.9 (L) 05/27/2018 0758   RBC 2.76 (L) 05/27/2018 0758   HGB 9.2 (L) 05/27/2018 0758   HCT 28.0 (L) 05/27/2018 0758   PLT 130 (L) 05/27/2018 0758   MCV 101.4 (H) 05/27/2018 0758   MCH 33.3 05/27/2018 0758   MCHC 32.9 05/27/2018 0758   RDW 16.2 (H) 05/27/2018 0758   LYMPHSABS 0.5 (L) 05/27/2018 0758   MONOABS 0.2 05/27/2018 0758   EOSABS 0.2 05/27/2018 0758   BASOSABS 0.0 05/27/2018 0758   CMP Latest Ref Rng & Units 05/27/2018 05/20/2018 05/11/2018  Glucose 65 - 99 mg/dL 103(H) 193(H) 190(H)  BUN 6 - 20 mg/dL 21(H) 22(H) 18  Creatinine 0.44 - 1.00 mg/dL 1.08(H) 0.96 0.97  Sodium 135 - 145 mmol/L 137 134(L) 133(L)  Potassium 3.5 - 5.1 mmol/L 3.9 4.4 3.6  Chloride 101 - 111 mmol/L 104 102 100(L)  CO2 22 - 32 mmol/L 23 23 23  Calcium 8.9 - 10.3 mg/dL 8.9 8.5(L) 9.4  Total Protein 6.5 - 8.1 g/dL 6.9 7.4 7.8  Total Bilirubin 0.3 - 1.2 mg/dL 1.0 0.7 0.7  Alkaline Phos 38 - 126 U/L 57 61 76    AST 15 - 41 U/L 15 17 16  ALT 14 - 54 U/L 9(L) 14 12(L)       ASSESSMENT & PLAN:   Multiple myeloma not having achieved remission (HCC) 1.  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,   standard risk: -Bone marrow biopsy on 01/03/2018 with 60% plasma cells, FISH panel with no abnormalities, chromosome analysis showing hyperdiploidy with gains of chromosomes 2, 3, 4, 7, 10, 15, beta-2 microglobulin 3.2, LDH normal, 0.9 g/dL of M spike at diagnosis, free light chain ratio of 114, Kappa Light chain of 1796 -Skeletal survey on 02/16/2018 showing subtle patchy areas of osteopenia of the thoracolumbar spine and possibly distal right clavicle which may reflect subtle changes of multiple myeloma - RVD cycle 1 started on 01/26/2018, cycle 4 on 04/13/2018, she is asynchronous with Revlimid and Velcade.  We have held her Revlimid once when [place] dropped to 600 during cycle 4. - She is here today for cycle 5-day 15.  We have counted her pills.  She has 4 more Revlimid tablets left.  Today ANC is 1000.  She may proceed with her Velcade shot.  If her ANC drops again below 1000, we will consider decreasing the dose of Revlimid to 20 mg. -I have discussed the results of M spike which went to 0.5.  This was undetectable on 04/09/2018.  It was 0.3 on 02/02/2018.  However light chain ratio has come down to 1.74 from 2.8.  Kappa light chains also come down to 44 from 100.  Hence I will continue the same regimen at this time.  I plan to repeat SPEP and free light chains in 1 month.  05/11/2018.  2.  Bone strengthening: She is on monthly denosumab injections.  Her last denosumab was on 04/13/2018.      Orders placed this encounter:  Orders Placed This Encounter  Procedures  . Kappa/lambda light chains  . Protein electrophoresis, serum  . CBC with Differential  . Comprehensive metabolic panel      Derek Jack, MD [place] [phone]

## 2018-05-27 NOTE — Patient Instructions (Signed)
[facility] Discharge Instructions  Today you saw Dr. K.   Thank you for choosing [facility] at [facility] to provide your oncology and hematology care.  To afford each patient quality time with our provider, please arrive at least 15 minutes before your scheduled appointment time.   If you have a lab appointment with the Cancer Center please come in thru the  Main Entrance and check in at the main information desk  You need to re-schedule your appointment should you arrive 10 or more minutes late.  We strive to give you quality time with our providers, and arriving late affects you and other patients whose appointments are after yours.  Also, if you no show three or more times for appointments you may be dismissed from the clinic at the providers discretion.     Again, thank you for choosing [facility].  Our hope is that these requests will decrease the amount of time that you wait before being seen by our physicians.       _____________________________________________________________  Should you have questions after your visit to [facility], please contact our office at [phone] between the hours of 8:30 a.m. and 4:30 p.m.  Voicemails left after 4:30 p.m. will not be returned until the following business day.  For prescription refill requests, have your pharmacy contact our office.       Resources For Cancer Patients and their Caregivers ? American Cancer Society: Can assist with transportation, wigs, general needs, runs Look Good Feel Better.        1-888-227-6333 ? Cancer Care: Provides financial assistance, online support groups, medication/co-pay assistance.  1-800-813-HOPE (4673) ? [facility] Assists [place] cancer patients and their families through emotional , educational and financial support.  336-427-4357 ? [place] DSS Where to apply for food  stamps, Medicaid and utility assistance. 336-342-1394 ? RCATS: Transportation to medical appointments. 336-347-2287 ? Social Security Administration: May apply for disability if have a Stage IV cancer. 336-342-7796 1-800-772-1213 ? [place] Aging, Disability and Transit Services: Assists with nutrition, care and transit needs. 336-349-2343  Cancer Center Support Programs:   > Cancer Support Group  2nd Tuesday of the month 1pm-2pm, Journey Room   > Creative Journey  3rd Tuesday of the month 1130am-1pm, Journey Room    

## 2018-05-27 NOTE — Progress Notes (Signed)
Exam and discussion today with Dr. Delton Coombes - he is aware of lab results, [place] 1000 - okay to tx with Velcade today per MD.   Brittany Archer presents today for injection per the provider's orders.  Velcade administration without incident; see MAR for injection details.  Patient tolerated procedure well and without incident.  No questions or complaints noted at this time.  Discharged ambulatory in c/o family.

## 2018-05-27 NOTE — Assessment & Plan Note (Signed)
1.  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: -Bone marrow biopsy on 01/03/2018 with 60% plasma cells, FISH panel with no abnormalities, chromosome analysis showing hyperdiploidy with gains of chromosomes 2, 3, 4, 7, 10, 15, beta-2 microglobulin 3.2, LDH normal, 0.9 g/dL of M spike at diagnosis, free light chain ratio of 114, Kappa Light chain of 1796 -Skeletal survey on 02/16/2018 showing subtle patchy areas of osteopenia of the thoracolumbar spine and possibly distal right clavicle which may reflect subtle changes of multiple myeloma - RVD cycle 1 started on 01/26/2018, cycle 4 on 04/13/2018, she is asynchronous with Revlimid and Velcade.  We have held her Revlimid once when [place] dropped to 600 during cycle 4. - She is here today for cycle 5-day 15.  We have counted her pills.  She has 4 more Revlimid tablets left.  Today ANC is 1000.  She may proceed with her Velcade shot.  If her ANC drops again below 1000, we will consider decreasing the dose of Revlimid to 20 mg. -I have discussed the results of M spike which went to 0.5.  This was undetectable on 04/09/2018.  It was 0.3 on 02/02/2018.  However light chain ratio has come down to 1.74 from 2.8.  Kappa light chains also come down to 44 from 100.  Hence I will continue the same regimen at this time.  I plan to repeat SPEP and free light chains in 1 month.  05/11/2018.  2.  Bone strengthening: She is on monthly denosumab injections.  Her last denosumab was on 04/13/2018.

## 2018-06-03 ENCOUNTER — Inpatient Hospital Stay (HOSPITAL_COMMUNITY): Payer: Medicare Other | Attending: Hematology

## 2018-06-03 ENCOUNTER — Inpatient Hospital Stay (HOSPITAL_COMMUNITY): Payer: Medicare Other

## 2018-06-03 DIAGNOSIS — E538 Deficiency of other specified B group vitamins: Secondary | ICD-10-CM | POA: Diagnosis not present

## 2018-06-03 DIAGNOSIS — C9000 Multiple myeloma not having achieved remission: Secondary | ICD-10-CM | POA: Insufficient documentation

## 2018-06-03 DIAGNOSIS — Z5111 Encounter for antineoplastic chemotherapy: Secondary | ICD-10-CM

## 2018-06-03 DIAGNOSIS — Z5112 Encounter for antineoplastic immunotherapy: Secondary | ICD-10-CM | POA: Diagnosis not present

## 2018-06-03 DIAGNOSIS — D472 Monoclonal gammopathy: Secondary | ICD-10-CM

## 2018-06-03 LAB — CBC WITH DIFFERENTIAL/PLATELET
BASOS ABS: 0 10*3/uL (ref 0.0–0.1)
Basophils Relative: 2 %
EOS ABS: 0.1 10*3/uL (ref 0.0–0.7)
EOS PCT: 4 %
HCT: 28.8 % — ABNORMAL LOW (ref 36.0–46.0)
Hemoglobin: 9.6 g/dL — ABNORMAL LOW (ref 12.0–15.0)
Lymphocytes Relative: 34 %
Lymphs Abs: 0.5 10*3/uL — ABNORMAL LOW (ref 0.7–4.0)
MCH: 33.4 pg (ref 26.0–34.0)
MCHC: 33.3 g/dL (ref 30.0–36.0)
MCV: 100.3 fL — AB (ref 78.0–100.0)
MONO ABS: 0.2 10*3/uL (ref 0.1–1.0)
Monocytes Relative: 16 %
NEUTROS PCT: 44 %
Neutro Abs: 0.6 10*3/uL — ABNORMAL LOW (ref 1.7–7.7)
PLATELETS: 142 10*3/uL — AB (ref 150–400)
RBC: 2.87 MIL/uL — AB (ref 3.87–5.11)
RDW: 16.3 % — ABNORMAL HIGH (ref 11.5–15.5)
WBC: 1.4 10*3/uL — AB (ref 4.0–10.5)

## 2018-06-03 LAB — COMPREHENSIVE METABOLIC PANEL
ALBUMIN: 3.5 g/dL (ref 3.5–5.0)
ALT: 9 U/L — AB (ref 14–54)
AST: 12 U/L — AB (ref 15–41)
Alkaline Phosphatase: 63 U/L (ref 38–126)
Anion gap: 9 (ref 5–15)
BUN: 14 mg/dL (ref 6–20)
CHLORIDE: 106 mmol/L (ref 101–111)
CO2: 25 mmol/L (ref 22–32)
CREATININE: 1.07 mg/dL — AB (ref 0.44–1.00)
Calcium: 8.6 mg/dL — ABNORMAL LOW (ref 8.9–10.3)
GFR calc Af Amer: 56 mL/min — ABNORMAL LOW (ref >60)
GFR calc non Af Amer: 48 mL/min — ABNORMAL LOW (ref >60)
GLUCOSE: 144 mg/dL — AB (ref 65–99)
Potassium: 4.3 mmol/L (ref 3.5–5.1)
SODIUM: 140 mmol/L (ref 135–145)
Total Bilirubin: 0.8 mg/dL (ref 0.3–1.2)
Total Protein: 7.2 g/dL (ref 6.5–8.1)

## 2018-06-03 MED ORDER — LENALIDOMIDE 25 MG PO CAPS: ORAL_CAPSULE | ORAL | 0 refills | Status: DC

## 2018-06-03 NOTE — Progress Notes (Signed)
Per Dr. Delton Coombes, Tried to call and notify patient to not take any Revlimid until after she has labs performed next Friday and finds out the results. The nurse next Friday will let her know. Unable to reach her. Will try again later this afternoon.

## 2018-06-03 NOTE — Progress Notes (Signed)
Spoke with patients sister, Lanney Gins, and instructed her to tell patient not to take any Revlimid until she has her labs rechecked next Friday. She verbalized understanding and states she will let the patient know.

## 2018-06-03 NOTE — Progress Notes (Signed)
Will hold velcade today per Dr. Delton Coombes due to abnormal labs.

## 2018-06-03 NOTE — Progress Notes (Signed)
CRITICAL VALUE ALERT Critical value received:  WBC-1.4 Date of notification:  06/03/18 Time of notification: 9629 Critical value read back:  Yes.   Nurse who received alert:  M.Roya Gieselman,LPN MD notified (1st page):  S.Katragadda, MD

## 2018-06-07 ENCOUNTER — Telehealth (HOSPITAL_COMMUNITY): Payer: Self-pay

## 2018-06-07 NOTE — Telephone Encounter (Signed)
Spoke with Cyril Loosen, the patients sister, she will tell Evelisse to hold off on taking the revlimid until she see the doctor again.

## 2018-06-10 ENCOUNTER — Inpatient Hospital Stay (HOSPITAL_COMMUNITY): Payer: Medicare Other

## 2018-06-10 ENCOUNTER — Encounter (HOSPITAL_COMMUNITY): Payer: Self-pay

## 2018-06-10 ENCOUNTER — Other Ambulatory Visit: Payer: Self-pay

## 2018-06-10 VITALS — BP 119/51 | HR 56 | Temp 97.6°F | Resp 18

## 2018-06-10 DIAGNOSIS — Z5112 Encounter for antineoplastic immunotherapy: Secondary | ICD-10-CM | POA: Diagnosis not present

## 2018-06-10 DIAGNOSIS — C9000 Multiple myeloma not having achieved remission: Secondary | ICD-10-CM

## 2018-06-10 DIAGNOSIS — E538 Deficiency of other specified B group vitamins: Secondary | ICD-10-CM

## 2018-06-10 LAB — COMPREHENSIVE METABOLIC PANEL
ALT: 11 U/L — ABNORMAL LOW (ref 14–54)
ANION GAP: 7 (ref 5–15)
AST: 13 U/L — AB (ref 15–41)
Albumin: 3.6 g/dL (ref 3.5–5.0)
Alkaline Phosphatase: 68 U/L (ref 38–126)
BILIRUBIN TOTAL: 0.6 mg/dL (ref 0.3–1.2)
BUN: 18 mg/dL (ref 6–20)
CO2: 26 mmol/L (ref 22–32)
Calcium: 9.5 mg/dL (ref 8.9–10.3)
Chloride: 106 mmol/L (ref 101–111)
Creatinine, Ser: 1.07 mg/dL — ABNORMAL HIGH (ref 0.44–1.00)
GFR calc Af Amer: 56 mL/min — ABNORMAL LOW (ref >60)
GFR, EST NON AFRICAN AMERICAN: 48 mL/min — AB (ref >60)
Glucose, Bld: 105 mg/dL — ABNORMAL HIGH (ref 65–99)
POTASSIUM: 3.4 mmol/L — AB (ref 3.5–5.1)
Sodium: 139 mmol/L (ref 135–145)
TOTAL PROTEIN: 7.4 g/dL (ref 6.5–8.1)

## 2018-06-10 LAB — CBC WITH DIFFERENTIAL/PLATELET
Basophils Absolute: 0.1 10*3/uL (ref 0.0–0.1)
Basophils Relative: 2 %
Eosinophils Absolute: 0.1 10*3/uL (ref 0.0–0.7)
Eosinophils Relative: 3 %
HCT: 28.8 % — ABNORMAL LOW (ref 36.0–46.0)
Hemoglobin: 9.6 g/dL — ABNORMAL LOW (ref 12.0–15.0)
LYMPHS PCT: 32 %
Lymphs Abs: 1 10*3/uL (ref 0.7–4.0)
MCH: 33.7 pg (ref 26.0–34.0)
MCHC: 33.3 g/dL (ref 30.0–36.0)
MCV: 101.1 fL — AB (ref 78.0–100.0)
MONO ABS: 0.2 10*3/uL (ref 0.1–1.0)
MONOS PCT: 7 %
NEUTROS ABS: 1.8 10*3/uL (ref 1.7–7.7)
Neutrophils Relative %: 56 %
Platelets: 253 10*3/uL (ref 150–400)
RBC: 2.85 MIL/uL — ABNORMAL LOW (ref 3.87–5.11)
RDW: 16 % — AB (ref 11.5–15.5)
WBC: 3.2 10*3/uL — ABNORMAL LOW (ref 4.0–10.5)

## 2018-06-10 MED ORDER — DENOSUMAB 120 MG/1.7ML ~~LOC~~ SOLN
120.0000 mg | Freq: Once | SUBCUTANEOUS | Status: AC
  Administered 2018-06-10: 120 mg via SUBCUTANEOUS
  Filled 2018-06-10: qty 1.7

## 2018-06-10 MED ORDER — PROCHLORPERAZINE MALEATE 10 MG PO TABS
ORAL_TABLET | ORAL | Status: AC
  Filled 2018-06-10: qty 1

## 2018-06-10 MED ORDER — PROCHLORPERAZINE MALEATE 10 MG PO TABS
10.0000 mg | ORAL_TABLET | Freq: Once | ORAL | Status: AC
  Administered 2018-06-10: 10 mg via ORAL

## 2018-06-10 MED ORDER — CYANOCOBALAMIN 1000 MCG/ML IJ SOLN
INTRAMUSCULAR | Status: AC
  Filled 2018-06-10: qty 1

## 2018-06-10 MED ORDER — LENALIDOMIDE 20 MG PO CAPS: ORAL_CAPSULE | ORAL | 0 refills | Status: DC

## 2018-06-10 MED ORDER — CYANOCOBALAMIN 1000 MCG/ML IJ SOLN
1000.0000 ug | Freq: Once | INTRAMUSCULAR | Status: AC
Start: 2018-06-10 — End: 2018-06-10
  Administered 2018-06-10: 1000 ug via INTRAMUSCULAR

## 2018-06-10 MED ORDER — BORTEZOMIB CHEMO SQ INJECTION 3.5 MG (2.5MG/ML)
1.3000 mg/m2 | Freq: Once | INTRAMUSCULAR | Status: AC
  Administered 2018-06-10: 2.5 mg via SUBCUTANEOUS
  Filled 2018-06-10: qty 1

## 2018-06-10 NOTE — Progress Notes (Signed)
Labs reviewed with Dr. Delton Coombes, proceed with treatment.  B12 ,xgeva and velcade given today per orders. See MAR for details.   Prescription sent in for revlimid 20 mg , instructed patient to start Monday.   Treatment given per orders. Patient tolerated it well without problems. Vitals stable and discharged home from clinic ambulatory. Follow up as scheduled.

## 2018-06-10 NOTE — Patient Instructions (Signed)
[facility] Discharge Instructions for Patients Receiving Chemotherapy   Beginning January 23rd 2017 lab work for the [facility] will be done in the  Main lab at [facility] on 1st floor. If you have a lab appointment with the [place] please come in thru the  Main Entrance and check in at the main information desk  B12 and xgeva given today.   Today you received the following chemotherapy agents velcade.  To help prevent nausea and vomiting after your treatment, we encourage you to take your nausea medication     If you develop nausea and vomiting, or diarrhea that is not controlled by your medication, call the clinic.  The clinic phone number is [phone]. Office hours are Monday-Friday 8:30am-5:00pm.  BELOW ARE SYMPTOMS THAT SHOULD BE REPORTED IMMEDIATELY:  *FEVER GREATER THAN 101.0 F  *CHILLS WITH OR WITHOUT FEVER  NAUSEA AND VOMITING THAT IS NOT CONTROLLED WITH YOUR NAUSEA MEDICATION  *UNUSUAL SHORTNESS OF BREATH  *UNUSUAL BRUISING OR BLEEDING  TENDERNESS IN MOUTH AND THROAT WITH OR WITHOUT PRESENCE OF ULCERS  *URINARY PROBLEMS  *BOWEL PROBLEMS  UNUSUAL RASH Items with * indicate a potential emergency and should be followed up as soon as possible. If you have an emergency after office hours please contact your primary care physician or go to the nearest emergency department.  Please call the clinic during office hours if you have any questions or concerns.   You may also contact the Patient Navigator at [phone] should you have any questions or need assistance in obtaining follow up care.      Resources For Cancer Patients and their Caregivers ? American Cancer Society: Can assist with transportation, wigs, general needs, runs Look Good Feel Better.        (609) 085-9815 ? Cancer Care: Provides financial assistance, online support groups, medication/co-pay assistance.  1-800-813-HOPE 570-672-9706) ? [facility] Assists [place] cancer patients and their families through emotional , educational and financial support.  905-451-8406 ? [place] DSS Where to apply for food stamps, Medicaid and utility assistance. 640-467-6380 ? [employer]: Transportation to medical appointments. 5394369125 ? Social Security Administration: May apply for disability if have a Stage IV cancer. (312)832-6683 (551) 770-9018 ? [employer], Disability and Transit Services: Assists with nutrition, care and transit needs. ([phone] [phone]

## 2018-06-13 ENCOUNTER — Telehealth (HOSPITAL_COMMUNITY): Payer: Self-pay | Admitting: Hematology

## 2018-06-13 LAB — PROTEIN ELECTROPHORESIS, SERUM
A/G RATIO SPE: 1.1 (ref 0.7–1.7)
ALBUMIN ELP: 3.5 g/dL (ref 2.9–4.4)
ALPHA-1-GLOBULIN: 0.2 g/dL (ref 0.0–0.4)
Alpha-2-Globulin: 0.9 g/dL (ref 0.4–1.0)
BETA GLOBULIN: 1.2 g/dL (ref 0.7–1.3)
Gamma Globulin: 0.8 g/dL (ref 0.4–1.8)
Globulin, Total: 3.2 g/dL (ref 2.2–3.9)
M-Spike, %: 0.5 g/dL — ABNORMAL HIGH
Total Protein ELP: 6.7 g/dL (ref 6.0–8.5)

## 2018-06-13 LAB — KAPPA/LAMBDA LIGHT CHAINS
KAPPA, LAMDA LIGHT CHAIN RATIO: 1.64 (ref 0.26–1.65)
Kappa free light chain: 47.3 mg/L — ABNORMAL HIGH (ref 3.3–19.4)
Lambda free light chains: 28.9 mg/L — ABNORMAL HIGH (ref 5.7–26.3)

## 2018-06-13 NOTE — Telephone Encounter (Signed)
PC TO PT TO LET HER KNOW HER REVLIMID WILL NOT BE DELIVERED TODAY AND TO LISTEN OUT FOR A CALL FROM [facility]. ADVISED HER TO CONTACT us WHEN MEDS ARE DELIVERED.

## 2018-06-17 ENCOUNTER — Other Ambulatory Visit: Payer: Self-pay

## 2018-06-17 ENCOUNTER — Encounter (HOSPITAL_COMMUNITY): Payer: Self-pay

## 2018-06-17 ENCOUNTER — Inpatient Hospital Stay (HOSPITAL_COMMUNITY): Payer: Medicare Other

## 2018-06-17 VITALS — BP 125/54 | HR 82 | Temp 98.7°F | Resp 18

## 2018-06-17 DIAGNOSIS — C9000 Multiple myeloma not having achieved remission: Secondary | ICD-10-CM

## 2018-06-17 DIAGNOSIS — D472 Monoclonal gammopathy: Secondary | ICD-10-CM

## 2018-06-17 DIAGNOSIS — Z5112 Encounter for antineoplastic immunotherapy: Secondary | ICD-10-CM | POA: Diagnosis not present

## 2018-06-17 DIAGNOSIS — Z5111 Encounter for antineoplastic chemotherapy: Secondary | ICD-10-CM

## 2018-06-17 LAB — COMPREHENSIVE METABOLIC PANEL
ALBUMIN: 3.7 g/dL (ref 3.5–5.0)
ALT: 10 U/L — AB (ref 14–54)
AST: 16 U/L (ref 15–41)
Alkaline Phosphatase: 69 U/L (ref 38–126)
Anion gap: 9 (ref 5–15)
BILIRUBIN TOTAL: 0.5 mg/dL (ref 0.3–1.2)
BUN: 19 mg/dL (ref 6–20)
CHLORIDE: 106 mmol/L (ref 101–111)
CO2: 22 mmol/L (ref 22–32)
CREATININE: 1.04 mg/dL — AB (ref 0.44–1.00)
Calcium: 9.3 mg/dL (ref 8.9–10.3)
GFR calc Af Amer: 58 mL/min — ABNORMAL LOW (ref >60)
GFR calc non Af Amer: 50 mL/min — ABNORMAL LOW (ref >60)
GLUCOSE: 152 mg/dL — AB (ref 65–99)
POTASSIUM: 4 mmol/L (ref 3.5–5.1)
Sodium: 137 mmol/L (ref 135–145)
TOTAL PROTEIN: 7.5 g/dL (ref 6.5–8.1)

## 2018-06-17 LAB — CBC WITH DIFFERENTIAL/PLATELET
BASOS ABS: 0 10*3/uL (ref 0.0–0.1)
BASOS PCT: 0 %
Eosinophils Absolute: 0 10*3/uL (ref 0.0–0.7)
Eosinophils Relative: 0 %
HEMATOCRIT: 29 % — AB (ref 36.0–46.0)
Hemoglobin: 9.5 g/dL — ABNORMAL LOW (ref 12.0–15.0)
LYMPHS PCT: 10 %
Lymphs Abs: 0.6 10*3/uL — ABNORMAL LOW (ref 0.7–4.0)
MCH: 33.1 pg (ref 26.0–34.0)
MCHC: 32.8 g/dL (ref 30.0–36.0)
MCV: 101 fL — AB (ref 78.0–100.0)
MONO ABS: 0.1 10*3/uL (ref 0.1–1.0)
Monocytes Relative: 1 %
NEUTROS ABS: 5.4 10*3/uL (ref 1.7–7.7)
Neutrophils Relative %: 89 %
Platelets: 224 10*3/uL (ref 150–400)
RBC: 2.87 MIL/uL — ABNORMAL LOW (ref 3.87–5.11)
RDW: 16.9 % — AB (ref 11.5–15.5)
WBC: 6.1 10*3/uL (ref 4.0–10.5)

## 2018-06-17 MED ORDER — PROCHLORPERAZINE MALEATE 10 MG PO TABS
10.0000 mg | ORAL_TABLET | Freq: Once | ORAL | Status: AC
  Administered 2018-06-17: 10 mg via ORAL
  Filled 2018-06-17: qty 1

## 2018-06-17 MED ORDER — BORTEZOMIB CHEMO SQ INJECTION 3.5 MG (2.5MG/ML)
1.3000 mg/m2 | Freq: Once | INTRAMUSCULAR | Status: AC
  Administered 2018-06-17: 2.5 mg via SUBCUTANEOUS
  Filled 2018-06-17: qty 1

## 2018-06-17 NOTE — Progress Notes (Signed)
Brittany Archer presents today for injection per the provider's orders.  Velcade administration without incident; see MAR for injection details.  Patient tolerated procedure well and without incident.  No questions or complaints noted at this time. Discharged ambulatory in c/o family.  

## 2018-06-24 ENCOUNTER — Inpatient Hospital Stay (HOSPITAL_COMMUNITY): Payer: Medicare Other

## 2018-06-24 ENCOUNTER — Inpatient Hospital Stay (HOSPITAL_BASED_OUTPATIENT_CLINIC_OR_DEPARTMENT_OTHER): Payer: Medicare Other | Admitting: Hematology

## 2018-06-24 ENCOUNTER — Encounter (HOSPITAL_COMMUNITY): Payer: Self-pay | Admitting: Hematology

## 2018-06-24 VITALS — BP 109/49 | HR 72 | Temp 98.0°F | Resp 18

## 2018-06-24 VITALS — BP 137/58 | HR 75 | Temp 98.1°F | Resp 16 | Wt 174.6 lb

## 2018-06-24 DIAGNOSIS — Z5111 Encounter for antineoplastic chemotherapy: Secondary | ICD-10-CM

## 2018-06-24 DIAGNOSIS — Z5112 Encounter for antineoplastic immunotherapy: Secondary | ICD-10-CM | POA: Diagnosis not present

## 2018-06-24 DIAGNOSIS — D472 Monoclonal gammopathy: Secondary | ICD-10-CM

## 2018-06-24 DIAGNOSIS — C9000 Multiple myeloma not having achieved remission: Secondary | ICD-10-CM

## 2018-06-24 LAB — CBC WITH DIFFERENTIAL/PLATELET
BASOS PCT: 0 %
Basophils Absolute: 0 10*3/uL (ref 0.0–0.1)
EOS ABS: 0.1 10*3/uL (ref 0.0–0.7)
EOS PCT: 1 %
HEMATOCRIT: 29.9 % — AB (ref 36.0–46.0)
HEMOGLOBIN: 9.7 g/dL — AB (ref 12.0–15.0)
LYMPHS PCT: 11 %
Lymphs Abs: 0.7 10*3/uL (ref 0.7–4.0)
MCH: 32.7 pg (ref 26.0–34.0)
MCHC: 32.4 g/dL (ref 30.0–36.0)
MCV: 100.7 fL — ABNORMAL HIGH (ref 78.0–100.0)
MONOS PCT: 4 %
Monocytes Absolute: 0.3 10*3/uL (ref 0.1–1.0)
NEUTROS ABS: 5.5 10*3/uL (ref 1.7–7.7)
NEUTROS PCT: 84 %
Platelets: 212 10*3/uL (ref 150–400)
RBC: 2.97 MIL/uL — ABNORMAL LOW (ref 3.87–5.11)
RDW: 17.4 % — ABNORMAL HIGH (ref 11.5–15.5)
WBC: 6.6 10*3/uL (ref 4.0–10.5)

## 2018-06-24 LAB — COMPREHENSIVE METABOLIC PANEL
ALBUMIN: 3.9 g/dL (ref 3.5–5.0)
ALK PHOS: 68 U/L (ref 38–126)
ALT: 10 U/L (ref 0–44)
AST: 17 U/L (ref 15–41)
Anion gap: 11 (ref 5–15)
BUN: 17 mg/dL (ref 8–23)
CALCIUM: 9.8 mg/dL (ref 8.9–10.3)
CO2: 22 mmol/L (ref 22–32)
CREATININE: 1.26 mg/dL — AB (ref 0.44–1.00)
Chloride: 105 mmol/L (ref 98–111)
GFR calc non Af Amer: 40 mL/min — ABNORMAL LOW (ref >60)
GFR, EST AFRICAN AMERICAN: 46 mL/min — AB (ref >60)
GLUCOSE: 129 mg/dL — AB (ref 70–99)
Potassium: 3.6 mmol/L (ref 3.5–5.1)
SODIUM: 138 mmol/L (ref 135–145)
Total Bilirubin: 0.8 mg/dL (ref 0.3–1.2)
Total Protein: 7.4 g/dL (ref 6.5–8.1)

## 2018-06-24 MED ORDER — BORTEZOMIB CHEMO SQ INJECTION 3.5 MG (2.5MG/ML)
1.3000 mg/m2 | Freq: Once | INTRAMUSCULAR | Status: AC
  Administered 2018-06-24: 2.5 mg via SUBCUTANEOUS
  Filled 2018-06-24: qty 1

## 2018-06-24 MED ORDER — PROCHLORPERAZINE MALEATE 10 MG PO TABS
10.0000 mg | ORAL_TABLET | Freq: Once | ORAL | Status: AC
  Administered 2018-06-24: 10 mg via ORAL
  Filled 2018-06-24: qty 1

## 2018-06-24 NOTE — Progress Notes (Signed)
Patient is taking Revlimid as directed. She denies side effects.

## 2018-06-24 NOTE — Progress Notes (Signed)
Brittany Archer tolerated Velcade injection well without complaints or incident. Labs reviewed with and pt seen by Dr. Delton Coombes prior to administering this medication.Pt continues to take Revlimid as prescribed without issues VSS Pt discharged self ambulatory in satisfactory condition accompanied by family member

## 2018-06-24 NOTE — Patient Instructions (Signed)
[facility] at [facility] Discharge Instructions  You saw Dr. Katragadda today.   Thank you for choosing [facility] at [facility] to provide your oncology and hematology care.  To afford each patient quality time with our provider, please arrive at least 15 minutes before your scheduled appointment time.   If you have a lab appointment with the Cancer Center please come in thru the  Main Entrance and check in at the main information desk  You need to re-schedule your appointment should you arrive 10 or more minutes late.  We strive to give you quality time with our providers, and arriving late affects you and other patients whose appointments are after yours.  Also, if you no show three or more times for appointments you may be dismissed from the clinic at the providers discretion.     Again, thank you for choosing [facility].  Our hope is that these requests will decrease the amount of time that you wait before being seen by our physicians.       _____________________________________________________________  Should you have questions after your visit to [facility], please contact our office at [phone] between the hours of 8:30 a.m. and 4:30 p.m.  Voicemails left after 4:30 p.m. will not be returned until the following business day.  For prescription refill requests, have your pharmacy contact our office.       Resources For Cancer Patients and their Caregivers ? American Cancer Society: Can assist with transportation, wigs, general needs, runs Look Good Feel Better.        1-888-227-6333 ? Cancer Care: Provides financial assistance, online support groups, medication/co-pay assistance.  1-800-813-HOPE (4673) ? [facility] Assists [place] cancer patients and their families through emotional , educational and financial support.  336-427-4357 ? [place] DSS Where to apply for  food stamps, Medicaid and utility assistance. 336-342-1394 ? RCATS: Transportation to medical appointments. 336-347-2287 ? Social Security Administration: May apply for disability if have a Stage IV cancer. 336-342-7796 1-800-772-1213 ? [place] [employer], Disability and Transit Services: Assists with nutrition, care and transit needs. 336-349-2343  Cancer Center Support Programs:   > Cancer Support Group  2nd Tuesday of the month 1pm-2pm, Journey Room   > Creative Journey  3rd Tuesday of the month 1130am-1pm, Journey Room     

## 2018-06-24 NOTE — Patient Instructions (Signed)
[place] at [facility] Discharge Instructions  Received Velcade injection today. Follow-up as scheduled. Call clinic for any questions or concerns   Thank you for choosing [facility] at [facility] to provide your oncology and hematology care.  To afford each patient quality time with our provider, please arrive at least 15 minutes before your scheduled appointment time.   If you have a lab appointment with the [facility] please come in thru the  Main Entrance and check in at the main information desk  You need to re-schedule your appointment should you arrive 10 or more minutes late.  We strive to give you quality time with our providers, and arriving late affects you and other patients whose appointments are after yours.  Also, if you no show three or more times for appointments you may be dismissed from the clinic at the providers discretion.     Again, thank you for choosing [facility].  Our hope is that these requests will decrease the amount of time that you wait before being seen by our physicians.       _____________________________________________________________  Should you have questions after your visit to [facility], please contact our office at [phone] between the hours of 8:30 a.m. and 4:30 p.m.  Voicemails left after 4:30 p.m. will not be returned until the following business day.  For prescription refill requests, have your pharmacy contact our office.       Resources For Cancer Patients and their Caregivers ? American Cancer Society: Can assist with transportation, wigs, general needs, runs Look Good Feel Better.        (301) 432-8923 ? Cancer Care: Provides financial assistance, online support groups, medication/co-pay assistance.  1-800-813-HOPE (240)256-1816) ? [facility] Assists [place] cancer patients and their families through emotional , educational and  financial support.  581-651-7837 ? [place] DSS Where to apply for food stamps, Medicaid and utility assistance. 267-794-3754 ? [employer]: Transportation to medical appointments. 629-825-2874 ? Social Security Administration: May apply for disability if have a Stage IV cancer. (281)231-5896 631-463-7199 ? [employer], Disability and Transit Services: Assists with nutrition, care and transit needs. [facility] Support Programs:   > Cancer Support Group  2nd Tuesday of the month 1pm-2pm, Journey Room   > Creative Journey  3rd Tuesday of the month 1130am-1pm, Journey Room

## 2018-06-24 NOTE — Assessment & Plan Note (Signed)
1.  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: -Bone marrow biopsy on 01/03/2018 with 60% plasma cells, FISH panel with no abnormalities, chromosome analysis showing hyperdiploidy with gains of chromosomes 2, 3, 4, 7, 10, 15, beta-2 microglobulin 3.2, LDH normal, 0.9 g/dL of M spike at diagnosis, free light chain ratio of 114, Kappa Light chain of 1796 -Skeletal survey on 02/16/2018 showing subtle patchy areas of osteopenia of the thoracolumbar spine and possibly distal right clavicle which may reflect subtle changes of multiple myeloma - RVD (Velcade on days 1, 8 and 15, Revlimid 25 on days 1-21, dexamethasone 10 pills on day of Velcade every 28 days) cycle 1 started on 01/26/2018, cycle 4 on 04/13/2018, she is asynchronous with Revlimid and Velcade.  We have held her Revlimid once when ANC dropped to 600 during cycle 4. - She had drop in her white count in the past.  If there is any further drop of ANC, we will consider decreasing Revlimid dose to 20 mg daily. - We discussed the results of the M spike which is stable at 0.5.  However free light chain ratio has decreased.  Hence I did not recommend any changes.  If there is any increase in M spike, we will consider changing her regimen.  2.  Bone strengthening: She will continue monthly denosumab injections. 

## 2018-06-24 NOTE — Progress Notes (Signed)
Brittany Archer,  [id number]   CLINIC:  Medical Oncology/Hematology  PCP:  Vesta Mixer [address] [place] [address] [phone]   REASON FOR VISIT:  Follow-up for multiple myeloma.  CURRENT THERAPY: RVD.  BRIEF ONCOLOGIC HISTORY:    Multiple myeloma not having achieved remission (Brittany Archer)   01/20/2018 Initial Diagnosis    Multiple myeloma not having achieved remission (Brittany Archer)      01/23/2018 -  Chemotherapy    The patient had bortezomib SQ (VELCADE) chemo injection 2.5 mg, 1.3 mg/m2 = 2.5 mg, Subcutaneous,  Once, 6 of 8 cycles Administration: 2.5 mg (01/26/2018), 2.5 mg (02/02/2018), 2.5 mg (02/09/2018), 2.5 mg (02/16/2018), 2.5 mg (02/23/2018), 2.5 mg (03/02/2018), 2.5 mg (03/09/2018), 2.5 mg (03/30/2018), 2.5 mg (04/06/2018), 2.5 mg (04/13/2018), 2.5 mg (04/21/2018), 2.5 mg (05/06/2018), 2.5 mg (05/11/2018), 2.5 mg (05/20/2018), 2.5 mg (05/27/2018), 2.5 mg (06/10/2018), 2.5 mg (06/17/2018)  for chemotherapy treatment.         CANCER STAGING: Cancer Staging No matching staging information was found for the patient.   INTERVAL HISTORY:  [name] 77 y.o. female returns for next weekly dose of Velcade.  She is taking Revlimid as directed.  She denies any GI side effects including nausea, vomiting or diarrhea.  She denies any tingling or numbness next remedies.  No fevers or infections were reported.  She is accompanied by her sister today.  Denies any new onset pains.  No ER visits or hospitalizations.    REVIEW OF SYSTEMS:  Review of Systems  Constitutional: Positive for fatigue.  All other systems reviewed and are negative.    PAST MEDICAL/SURGICAL HISTORY:  Past Medical History:  Diagnosis Date  . Breast cancer ([place])    left breast/ 2008/ surg/ rad tx  . Coronary artery disease   . Diabetes mellitus    Past Surgical History:  Procedure Laterality Date  . ABDOMINAL HYSTERECTOMY    . BREAST SURGERY       SOCIAL  HISTORY:  Social History   Socioeconomic History  . Marital status: Divorced    Spouse name: Not on file  . Number of children: Not on file  . Years of education: Not on file  . Highest education level: Not on file  Occupational History  . Not on file  Social Needs  . Financial resource strain: Not on file  . Food insecurity:    Worry: Not on file    Inability: Not on file  . Transportation needs:    Medical: Not on file    Non-medical: Not on file  Tobacco Use  . Smoking status: Never Smoker  . Smokeless tobacco: Never Used  Substance and Sexual Activity  . Alcohol use: No  . Drug use: No  . Sexual activity: Yes    Birth control/protection: Surgical  Lifestyle  . Physical activity:    Days per week: Not on file    Minutes per session: Not on file  . Stress: Not on file  Relationships  . Social connections:    Talks on phone: Not on file    Gets together: Not on file    Attends religious service: Not on file    Active member of club or organization: Not on file    Attends meetings of clubs or organizations: Not on file    Relationship status: Not on file  . Intimate partner violence:    Fear of current or ex partner: Not on file  Emotionally abused: Not on file    Physically abused: Not on file    Forced sexual activity: Not on file  Other Topics Concern  . Not on file  Social History Narrative  . Not on file    FAMILY HISTORY:  Family History  Problem Relation Age of Onset  . Obesity Sister     CURRENT MEDICATIONS:  Outpatient Encounter Medications as of 06/24/2018  Medication Sig  . acetaminophen (TYLENOL) 500 MG tablet Take 500 mg by mouth every 6 (six) hours as needed for mild pain or moderate pain.  Marland Kitchen acyclovir (ZOVIRAX) 400 MG tablet Take 1 tablet (400 mg total) by mouth 2 (two) times daily.  Marland Kitchen aspirin 81 MG tablet Take 81 mg by mouth daily.    . bortezomib IV (VELCADE) 3.5 MG injection Inject into the vein once. weekly  . cholecalciferol  (VITAMIN D) 1000 units tablet Take 1,000 Units by mouth daily.  . Denosumab (XGEVA [place]) Inject into the skin. Every 28 days  . dexamethasone (DECADRON) 4 MG tablet Take 10 tablets (40 mg) on days 1, 8, and 15 of chemo. Repeat every 21 days.  Marland Kitchen glipiZIDE (GLUCOTROL) 5 MG tablet   . lenalidomide (REVLIMID) 20 MG capsule Take one capsule daily on days 1-14 every 21 days.  Marland Kitchen lisinopril-hydrochlorothiazide (PRINZIDE,ZESTORETIC) 20-25 MG tablet   . metFORMIN (GLUCOPHAGE) 1000 MG tablet Take 1,000 mg by mouth 2 times daily at 12 noon and 4 pm.    . ondansetron (ZOFRAN) 8 MG tablet Take 1 tablet (8 mg total) by mouth 2 (two) times daily as needed (Nausea or vomiting).  . potassium chloride SA (K-DUR,KLOR-CON) 20 MEQ tablet Take 1 tablet (20 mEq total) by mouth daily.  . prochlorperazine (COMPAZINE) 10 MG tablet Take 1 tablet (10 mg total) by mouth every 6 (six) hours as needed (Nausea or vomiting).   No facility-administered encounter medications on file as of 06/24/2018.     ALLERGIES:  Allergies  Allergen Reactions  . Motrin [Ibuprofen] Rash     PHYSICAL EXAM:  ECOG Performance status: 1  Vitals:   06/24/18 0950  BP: (!) 137/58  Pulse: 75  Resp: 16  Temp: 98.1 F (36.7 C)  SpO2: 99%   Filed Weights   06/24/18 0950  Weight: 174 lb 9.6 oz (79.2 kg)    Physical Exam   LABORATORY DATA:  I have reviewed the labs as listed.  CBC    Component Value Date/Time   WBC 6.6 06/24/2018 0921   RBC 2.97 (L) 06/24/2018 0921   HGB 9.7 (L) 06/24/2018 0921   HCT 29.9 (L) 06/24/2018 0921   PLT 212 06/24/2018 0921   MCV 100.7 (H) 06/24/2018 0921   MCH 32.7 06/24/2018 0921   MCHC 32.4 06/24/2018 0921   RDW 17.4 (H) 06/24/2018 0921   LYMPHSABS 0.7 06/24/2018 0921   MONOABS 0.3 06/24/2018 0921   EOSABS 0.1 06/24/2018 0921   BASOSABS 0.0 06/24/2018 0921   CMP Latest Ref Rng & Units 06/24/2018 06/17/2018 06/10/2018  Glucose 70 - 99 mg/dL 129(H) 152(H) 105(H)  BUN 8 - 23 mg/dL _0 Creatinine 0.44 - 1.00 mg/dL 1.26(H) 1.04(H) 1.07(H)  Sodium 135 - 145 mmol/L 138 137 139  Potassium 3.5 - 5.1 mmol/L 3.6 4.0 3.4(L)  Chloride 98 - 111 mmol/L 105 106 106  CO2 22 - 32 mmol/L _1 Calcium 8.9 - 10.3 mg/dL 9.8 9.3 9.5  Total Protein 6.5 - 8.1 g/dL 7.4 7.5 7.4  Total Bilirubin 0.3 - 1.2 mg/dL 0.8 0.5 0.6  Alkaline Phos 38 - 126 U/L 68 69 68  AST 15 - 41 U/L 17 16 13(L)  ALT 0 - 44 U/L 10 10(L) 11(L)             ASSESSMENT & PLAN:   Multiple myeloma not having achieved remission (HCC) 1.  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: -Bone marrow biopsy on 01/03/2018 with 60% plasma cells, FISH panel with no abnormalities, chromosome analysis showing hyperdiploidy with gains of chromosomes 2, 3, 4, 7, 10, 15, beta-2 microglobulin 3.2, LDH normal, 0.9 g/dL of M spike at diagnosis, free light chain ratio of 114, Kappa Light chain of 1796 -Skeletal survey on 02/16/2018 showing subtle patchy areas of osteopenia of the thoracolumbar spine and possibly distal right clavicle which may reflect subtle changes of multiple myeloma - RVD (Velcade on days 1, 8 and 15, Revlimid 25 on days 1-21, dexamethasone 10 pills on day of Velcade every 28 days) cycle 1 started on 01/26/2018, cycle 4 on 04/13/2018, she is asynchronous with Revlimid and Velcade.  We have held her Revlimid once when [place] dropped to 600 during cycle 4. - She had drop in her white count in the past.  If there is any further drop of ANC, we will consider decreasing Revlimid dose to 20 mg daily. - We discussed the results of the M spike which is stable at 0.5.  However free light chain ratio has decreased.  Hence I did not recommend any changes.  If there is any increase in M spike, we will consider changing her regimen.  2.  Bone strengthening: She will continue monthly denosumab injections.      Orders placed this encounter:  Orders Placed This Encounter  Procedures  . Protein electrophoresis,  serum  . Kappa/lambda light chains  . CBC with Differential/Platelet  . Comprehensive metabolic panel      Derek Jack, MD [place] [phone]

## 2018-06-29 ENCOUNTER — Other Ambulatory Visit (HOSPITAL_COMMUNITY): Payer: Self-pay | Admitting: *Deleted

## 2018-06-29 DIAGNOSIS — C9000 Multiple myeloma not having achieved remission: Secondary | ICD-10-CM

## 2018-06-29 MED ORDER — LENALIDOMIDE 20 MG PO CAPS: ORAL_CAPSULE | ORAL | 0 refills | Status: DC

## 2018-07-01 ENCOUNTER — Ambulatory Visit (HOSPITAL_COMMUNITY): Payer: Medicare Other

## 2018-07-01 ENCOUNTER — Other Ambulatory Visit (HOSPITAL_COMMUNITY): Payer: Medicare Other

## 2018-07-07 ENCOUNTER — Other Ambulatory Visit (HOSPITAL_COMMUNITY): Payer: Self-pay | Admitting: *Deleted

## 2018-07-07 DIAGNOSIS — C9000 Multiple myeloma not having achieved remission: Secondary | ICD-10-CM

## 2018-07-07 DIAGNOSIS — E876 Hypokalemia: Secondary | ICD-10-CM

## 2018-07-07 MED ORDER — POTASSIUM CHLORIDE CRYS ER 20 MEQ PO TBCR: 20.0000 meq | EXTENDED_RELEASE_TABLET | Freq: Every day | ORAL | 3 refills | Status: DC

## 2018-07-08 ENCOUNTER — Other Ambulatory Visit: Payer: Self-pay

## 2018-07-08 ENCOUNTER — Inpatient Hospital Stay (HOSPITAL_COMMUNITY): Payer: Medicare Other | Attending: Hematology

## 2018-07-08 ENCOUNTER — Encounter (HOSPITAL_COMMUNITY): Payer: Self-pay

## 2018-07-08 ENCOUNTER — Inpatient Hospital Stay (HOSPITAL_COMMUNITY): Payer: Medicare Other

## 2018-07-08 VITALS — BP 124/55 | HR 67 | Temp 98.7°F | Resp 18 | Wt 179.0 lb

## 2018-07-08 DIAGNOSIS — E538 Deficiency of other specified B group vitamins: Secondary | ICD-10-CM

## 2018-07-08 DIAGNOSIS — Z5112 Encounter for antineoplastic immunotherapy: Secondary | ICD-10-CM | POA: Insufficient documentation

## 2018-07-08 DIAGNOSIS — C9000 Multiple myeloma not having achieved remission: Secondary | ICD-10-CM | POA: Diagnosis present

## 2018-07-08 DIAGNOSIS — D472 Monoclonal gammopathy: Secondary | ICD-10-CM

## 2018-07-08 DIAGNOSIS — E876 Hypokalemia: Secondary | ICD-10-CM | POA: Diagnosis not present

## 2018-07-08 DIAGNOSIS — Z5111 Encounter for antineoplastic chemotherapy: Secondary | ICD-10-CM

## 2018-07-08 LAB — COMPREHENSIVE METABOLIC PANEL
ALBUMIN: 3.6 g/dL (ref 3.5–5.0)
ALT: 10 U/L (ref 0–44)
ANION GAP: 10 (ref 5–15)
AST: 13 U/L — ABNORMAL LOW (ref 15–41)
Alkaline Phosphatase: 62 U/L (ref 38–126)
BILIRUBIN TOTAL: 0.7 mg/dL (ref 0.3–1.2)
BUN: 15 mg/dL (ref 8–23)
CO2: 24 mmol/L (ref 22–32)
Calcium: 8.8 mg/dL — ABNORMAL LOW (ref 8.9–10.3)
Chloride: 105 mmol/L (ref 98–111)
Creatinine, Ser: 0.94 mg/dL (ref 0.44–1.00)
GFR calc Af Amer: >60 mL/min (ref >60)
GFR calc non Af Amer: 57 mL/min — ABNORMAL LOW (ref >60)
GLUCOSE: 170 mg/dL — AB (ref 70–99)
POTASSIUM: 3.9 mmol/L (ref 3.5–5.1)
Sodium: 139 mmol/L (ref 135–145)
TOTAL PROTEIN: 7.1 g/dL (ref 6.5–8.1)

## 2018-07-08 LAB — CBC WITH DIFFERENTIAL/PLATELET
BASOS PCT: 0 %
Basophils Absolute: 0 10*3/uL (ref 0.0–0.1)
EOS ABS: 0 10*3/uL (ref 0.0–0.7)
Eosinophils Relative: 0 %
HEMATOCRIT: 26.6 % — AB (ref 36.0–46.0)
Hemoglobin: 8.7 g/dL — ABNORMAL LOW (ref 12.0–15.0)
Lymphocytes Relative: 19 %
Lymphs Abs: 0.6 10*3/uL — ABNORMAL LOW (ref 0.7–4.0)
MCH: 33.7 pg (ref 26.0–34.0)
MCHC: 32.7 g/dL (ref 30.0–36.0)
MCV: 103.1 fL — ABNORMAL HIGH (ref 78.0–100.0)
MONO ABS: 0.1 10*3/uL (ref 0.1–1.0)
MONOS PCT: 4 %
Neutro Abs: 2.3 10*3/uL (ref 1.7–7.7)
Neutrophils Relative %: 77 %
Platelets: 262 10*3/uL (ref 150–400)
RBC: 2.58 MIL/uL — ABNORMAL LOW (ref 3.87–5.11)
RDW: 17.8 % — AB (ref 11.5–15.5)
WBC: 3 10*3/uL — ABNORMAL LOW (ref 4.0–10.5)

## 2018-07-08 MED ORDER — CYANOCOBALAMIN 1000 MCG/ML IJ SOLN
1000.0000 ug | Freq: Once | INTRAMUSCULAR | Status: AC
  Administered 2018-07-08: 1000 ug via INTRAMUSCULAR
  Filled 2018-07-08: qty 1

## 2018-07-08 MED ORDER — PROCHLORPERAZINE MALEATE 10 MG PO TABS
10.0000 mg | ORAL_TABLET | Freq: Once | ORAL | Status: AC
  Administered 2018-07-08: 10 mg via ORAL
  Filled 2018-07-08: qty 1

## 2018-07-08 MED ORDER — DENOSUMAB 120 MG/1.7ML ~~LOC~~ SOLN
120.0000 mg | Freq: Once | SUBCUTANEOUS | Status: AC
  Administered 2018-07-08: 120 mg via SUBCUTANEOUS
  Filled 2018-07-08: qty 1.7

## 2018-07-08 MED ORDER — BORTEZOMIB CHEMO SQ INJECTION 3.5 MG (2.5MG/ML)
1.3000 mg/m2 | Freq: Once | INTRAMUSCULAR | Status: AC
  Administered 2018-07-08: 2.5 mg via SUBCUTANEOUS
  Filled 2018-07-08: qty 1

## 2018-07-08 NOTE — Progress Notes (Signed)
Brittany Archer presents today for injection per the provider's orders.  Velcade, Xgeva, and B12 administrations without incident; see MAR for injection details.  Patient tolerated procedure well and without incident.  No questions or complaints noted at this time.  Discharged ambulatory in c/o family.

## 2018-07-15 ENCOUNTER — Inpatient Hospital Stay (HOSPITAL_COMMUNITY): Payer: Medicare Other

## 2018-07-15 VITALS — BP 110/48 | HR 61 | Temp 98.1°F | Resp 18 | Wt 174.4 lb

## 2018-07-15 DIAGNOSIS — C9000 Multiple myeloma not having achieved remission: Secondary | ICD-10-CM

## 2018-07-15 DIAGNOSIS — Z5112 Encounter for antineoplastic immunotherapy: Secondary | ICD-10-CM | POA: Diagnosis not present

## 2018-07-15 LAB — CBC WITH DIFFERENTIAL/PLATELET
BASOS PCT: 0 %
Basophils Absolute: 0 10*3/uL (ref 0.0–0.1)
Eosinophils Absolute: 0.1 10*3/uL (ref 0.0–0.7)
Eosinophils Relative: 1 %
HEMATOCRIT: 26.6 % — AB (ref 36.0–46.0)
HEMOGLOBIN: 8.9 g/dL — AB (ref 12.0–15.0)
LYMPHS PCT: 14 %
Lymphs Abs: 0.7 10*3/uL (ref 0.7–4.0)
MCH: 34.8 pg — ABNORMAL HIGH (ref 26.0–34.0)
MCHC: 33.5 g/dL (ref 30.0–36.0)
MCV: 103.9 fL — ABNORMAL HIGH (ref 78.0–100.0)
Monocytes Absolute: 0.2 10*3/uL (ref 0.1–1.0)
Monocytes Relative: 5 %
NEUTROS ABS: 3.9 10*3/uL (ref 1.7–7.7)
Neutrophils Relative %: 80 %
Platelets: 195 10*3/uL (ref 150–400)
RBC: 2.56 MIL/uL — ABNORMAL LOW (ref 3.87–5.11)
RDW: 17.8 % — ABNORMAL HIGH (ref 11.5–15.5)
WBC: 4.9 10*3/uL (ref 4.0–10.5)

## 2018-07-15 LAB — COMPREHENSIVE METABOLIC PANEL
ALBUMIN: 3.6 g/dL (ref 3.5–5.0)
ALK PHOS: 55 U/L (ref 38–126)
ALT: 8 U/L (ref 0–44)
ANION GAP: 10 (ref 5–15)
AST: 13 U/L — ABNORMAL LOW (ref 15–41)
BILIRUBIN TOTAL: 0.8 mg/dL (ref 0.3–1.2)
BUN: 14 mg/dL (ref 8–23)
CALCIUM: 8.1 mg/dL — AB (ref 8.9–10.3)
CO2: 24 mmol/L (ref 22–32)
Chloride: 104 mmol/L (ref 98–111)
Creatinine, Ser: 1.09 mg/dL — ABNORMAL HIGH (ref 0.44–1.00)
GFR calc non Af Amer: 47 mL/min — ABNORMAL LOW (ref >60)
GFR, EST AFRICAN AMERICAN: 55 mL/min — AB (ref >60)
GLUCOSE: 109 mg/dL — AB (ref 70–99)
Potassium: 3.1 mmol/L — ABNORMAL LOW (ref 3.5–5.1)
Sodium: 138 mmol/L (ref 135–145)
TOTAL PROTEIN: 7.1 g/dL (ref 6.5–8.1)

## 2018-07-15 MED ORDER — PROCHLORPERAZINE MALEATE 10 MG PO TABS
10.0000 mg | ORAL_TABLET | Freq: Once | ORAL | Status: AC
  Administered 2018-07-15: 10 mg via ORAL
  Filled 2018-07-15: qty 1

## 2018-07-15 MED ORDER — BORTEZOMIB CHEMO SQ INJECTION 3.5 MG (2.5MG/ML)
1.3000 mg/m2 | Freq: Once | INTRAMUSCULAR | Status: AC
  Administered 2018-07-15: 2.5 mg via SUBCUTANEOUS
  Filled 2018-07-15: qty 1

## 2018-07-15 NOTE — Patient Instructions (Signed)
[facility] Discharge Instructions for Patients Receiving Chemotherapy  Today you received the following chemotherapy agents velcade.    If you develop nausea and vomiting that is not controlled by your nausea medication, call the clinic.   BELOW ARE SYMPTOMS THAT SHOULD BE REPORTED IMMEDIATELY:  *FEVER GREATER THAN 100.5 F  *CHILLS WITH OR WITHOUT FEVER  NAUSEA AND VOMITING THAT IS NOT CONTROLLED WITH YOUR NAUSEA MEDICATION  *UNUSUAL SHORTNESS OF BREATH  *UNUSUAL BRUISING OR BLEEDING  TENDERNESS IN MOUTH AND THROAT WITH OR WITHOUT PRESENCE OF ULCERS  *URINARY PROBLEMS  *BOWEL PROBLEMS  UNUSUAL RASH Items with * indicate a potential emergency and should be followed up as soon as possible.  Feel free to call the clinic should you have any questions or concerns. The clinic phone number is [phone].  Please show the CHEMO ALERT CARD at check-in to the Emergency Department and triage nurse.   

## 2018-07-15 NOTE — Progress Notes (Signed)
Reviewed Potassium 3.1 and calcium 8.1 and patients history with Dr. Walden Field.  Patient to start taking her PO potassium by mouth twice a day.  No orders received for low calcium.  Reviewed with the patient with understanding verbalized.   Patient tolerated velcade injection with no complaints voiced.  Site clean and dry with no bruising or swelling noted at site.  Band aid applied.  VSS with discharge and left ambulatory with no s/s of distress noted.

## 2018-07-18 LAB — PROTEIN ELECTROPHORESIS, SERUM
A/G Ratio: 1.2 (ref 0.7–1.7)
ALBUMIN ELP: 3.7 g/dL (ref 2.9–4.4)
Alpha-1-Globulin: 0.2 g/dL (ref 0.0–0.4)
Alpha-2-Globulin: 0.8 g/dL (ref 0.4–1.0)
Beta Globulin: 1.2 g/dL (ref 0.7–1.3)
GAMMA GLOBULIN: 0.9 g/dL (ref 0.4–1.8)
GLOBULIN, TOTAL: 3.1 g/dL (ref 2.2–3.9)
M-Spike, %: 0.4 g/dL — ABNORMAL HIGH
TOTAL PROTEIN ELP: 6.8 g/dL (ref 6.0–8.5)

## 2018-07-18 LAB — KAPPA/LAMBDA LIGHT CHAINS
KAPPA FREE LGHT CHN: 46 mg/L — AB (ref 3.3–19.4)
Kappa, lambda light chain ratio: 1.86 — ABNORMAL HIGH (ref 0.26–1.65)
Lambda free light chains: 24.7 mg/L (ref 5.7–26.3)

## 2018-07-19 ENCOUNTER — Other Ambulatory Visit (HOSPITAL_COMMUNITY): Payer: Self-pay | Admitting: *Deleted

## 2018-07-19 DIAGNOSIS — C9000 Multiple myeloma not having achieved remission: Secondary | ICD-10-CM

## 2018-07-19 MED ORDER — LENALIDOMIDE 20 MG PO CAPS: ORAL_CAPSULE | ORAL | 0 refills | Status: DC

## 2018-07-19 NOTE — Telephone Encounter (Signed)
Chart reviewed, Revlimid refilled.   

## 2018-07-22 ENCOUNTER — Inpatient Hospital Stay (HOSPITAL_BASED_OUTPATIENT_CLINIC_OR_DEPARTMENT_OTHER): Payer: Medicare Other | Admitting: Hematology

## 2018-07-22 ENCOUNTER — Encounter (HOSPITAL_COMMUNITY): Payer: Self-pay | Admitting: Hematology

## 2018-07-22 ENCOUNTER — Inpatient Hospital Stay (HOSPITAL_COMMUNITY): Payer: Medicare Other

## 2018-07-22 VITALS — BP 118/47 | HR 58 | Temp 98.4°F | Resp 16 | Wt 171.0 lb

## 2018-07-22 DIAGNOSIS — D472 Monoclonal gammopathy: Secondary | ICD-10-CM

## 2018-07-22 DIAGNOSIS — E876 Hypokalemia: Secondary | ICD-10-CM | POA: Diagnosis not present

## 2018-07-22 DIAGNOSIS — Z5111 Encounter for antineoplastic chemotherapy: Secondary | ICD-10-CM

## 2018-07-22 DIAGNOSIS — C9000 Multiple myeloma not having achieved remission: Secondary | ICD-10-CM | POA: Diagnosis not present

## 2018-07-22 DIAGNOSIS — Z5112 Encounter for antineoplastic immunotherapy: Secondary | ICD-10-CM | POA: Diagnosis not present

## 2018-07-22 LAB — CBC WITH DIFFERENTIAL/PLATELET
Basophils Absolute: 0 10*3/uL (ref 0.0–0.1)
Basophils Relative: 0 %
Eosinophils Absolute: 0 10*3/uL (ref 0.0–0.7)
Eosinophils Relative: 1 %
HEMATOCRIT: 25.8 % — AB (ref 36.0–46.0)
Hemoglobin: 8.8 g/dL — ABNORMAL LOW (ref 12.0–15.0)
LYMPHS ABS: 0.6 10*3/uL — AB (ref 0.7–4.0)
LYMPHS PCT: 15 %
MCH: 34.4 pg — AB (ref 26.0–34.0)
MCHC: 34.1 g/dL (ref 30.0–36.0)
MCV: 100.8 fL — AB (ref 78.0–100.0)
MONO ABS: 0.2 10*3/uL (ref 0.1–1.0)
Monocytes Relative: 6 %
NEUTROS ABS: 3.2 10*3/uL (ref 1.7–7.7)
Neutrophils Relative %: 78 %
Platelets: 116 10*3/uL — ABNORMAL LOW (ref 150–400)
RBC: 2.56 MIL/uL — AB (ref 3.87–5.11)
RDW: 18.4 % — ABNORMAL HIGH (ref 11.5–15.5)
WBC: 4.1 10*3/uL (ref 4.0–10.5)

## 2018-07-22 LAB — COMPREHENSIVE METABOLIC PANEL
ALT: 8 U/L (ref 0–44)
AST: 15 U/L (ref 15–41)
Albumin: 3.6 g/dL (ref 3.5–5.0)
Alkaline Phosphatase: 57 U/L (ref 38–126)
Anion gap: 9 (ref 5–15)
BILIRUBIN TOTAL: 1 mg/dL (ref 0.3–1.2)
BUN: 14 mg/dL (ref 8–23)
CALCIUM: 8 mg/dL — AB (ref 8.9–10.3)
CO2: 21 mmol/L — ABNORMAL LOW (ref 22–32)
CREATININE: 0.92 mg/dL (ref 0.44–1.00)
Chloride: 107 mmol/L (ref 98–111)
GFR, EST NON AFRICAN AMERICAN: 58 mL/min — AB (ref >60)
Glucose, Bld: 106 mg/dL — ABNORMAL HIGH (ref 70–99)
Potassium: 3.6 mmol/L (ref 3.5–5.1)
Sodium: 137 mmol/L (ref 135–145)
TOTAL PROTEIN: 6.7 g/dL (ref 6.5–8.1)

## 2018-07-22 MED ORDER — PROCHLORPERAZINE MALEATE 10 MG PO TABS
10.0000 mg | ORAL_TABLET | Freq: Once | ORAL | Status: AC
  Administered 2018-07-22: 10 mg via ORAL
  Filled 2018-07-22: qty 1

## 2018-07-22 MED ORDER — BORTEZOMIB CHEMO SQ INJECTION 3.5 MG (2.5MG/ML)
1.3000 mg/m2 | Freq: Once | INTRAMUSCULAR | Status: AC
  Administered 2018-07-22: 2.5 mg via SUBCUTANEOUS
  Filled 2018-07-22: qty 1

## 2018-07-22 NOTE — Progress Notes (Signed)
Brittany Archer presents today for injection per the provider's orders.  Velcade administration without incident; see MAR for injection details.  Patient tolerated procedure well and without incident.  No questions or complaints noted at this time. Discharged ambulatory in c/o family.  

## 2018-07-22 NOTE — Patient Instructions (Signed)
[facility] at [facility]  Discharge Instructions:  You were seen by dr. k today _______________________________________________________________  Thank you for choosing [facility] at [facility] to provide your oncology and hematology care.  To afford each patient quality time with our providers, please arrive at least 15 minutes before your scheduled appointment.  You need to re-schedule your appointment if you arrive 10 or more minutes late.  We strive to give you quality time with our providers, and arriving late affects you and other patients whose appointments are after yours.  Also, if you no show three or more times for appointments you may be dismissed from the clinic.  Again, thank you for choosing [facility] at [facility]. Our hope is that these requests will allow you access to exceptional care and in a timely manner. _______________________________________________________________  If you have questions after your visit, please contact our office at [phone] between the hours of 8:30 a.m. and 5:00 p.m. Voicemails left after 4:30 p.m. will not be returned until the following business day. _______________________________________________________________  For prescription refill requests, have your pharmacy contact our office. _______________________________________________________________  Recommendations made by the consultant and any test results will be sent to your referring physician. _______________________________________________________________ 

## 2018-07-22 NOTE — Assessment & Plan Note (Signed)
1.  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: -Bone marrow biopsy on 01/03/2018 with 60% plasma cells, FISH panel with no abnormalities, chromosome analysis showing hyperdiploidy with gains of chromosomes 2, 3, 4, 7, 10, 15, beta-2 microglobulin 3.2, LDH normal, 0.9 g/dL of M spike at diagnosis, free light chain ratio of 114, Kappa Light chain of 1796 -Skeletal survey on 02/16/2018 showing subtle patchy areas of osteopenia of the thoracolumbar spine and possibly distal right clavicle which may reflect subtle changes of multiple myeloma - RVD (Velcade on days 1, 8 and 15, Revlimid 25 on days 1-21, dexamethasone 10 pills on day of Velcade every 28 days) cycle 1 started on 01/26/2018, cycle 4 on 04/13/2018, she is asynchronous with Revlimid and Velcade.  We have held her Revlimid once when [place] dropped to 600 during cycle 4. - She had drop in her white count in the past.  If there is any further drop of ANC, we will consider decreasing Revlimid dose to 20 mg daily. - We discussed the results of the M spike on 07/15/2018 which has come down to 0.4.  Previously this was 0.5.  Free light chain ratio was 1.86, slightly up from 1.64.  Hence we will continue the same regimen at this time.  She finished her last Revlimid pill yesterday.  She will have the next week off of Revlimid.  I will see her back in 6 weeks with repeat myeloma work-up 1 week prior.  2.  Bone strengthening: She will continue monthly denosumab injections.  She will continue calcium and vitamin D twice daily.  3.  Hypokalemia: Today her potassium is within normal limits.  She will continue potassium 20 mEq twice a day.

## 2018-07-22 NOTE — Progress Notes (Signed)
[place], [place] [address]   CLINIC:  Medical Oncology/Hematology  PCP:  Vesta Mixer [address] [place] [address] [phone]   REASON FOR VISIT:  Follow-up for multiple myeloma.  CURRENT THERAPY: RVD.  BRIEF ONCOLOGIC HISTORY:    Multiple myeloma not having achieved remission ([place])   01/20/2018 Initial Diagnosis    Multiple myeloma not having achieved remission ([facility] [place])      01/23/2018 -  Chemotherapy    The patient had bortezomib SQ (VELCADE) chemo injection 2.5 mg, 1.3 mg/m2 = 2.5 mg, Subcutaneous,  Once, 7 of 8 cycles Administration: 2.5 mg (01/26/2018), 2.5 mg (02/02/2018), 2.5 mg (02/09/2018), 2.5 mg (02/16/2018), 2.5 mg (02/23/2018), 2.5 mg (03/02/2018), 2.5 mg (03/09/2018), 2.5 mg (03/30/2018), 2.5 mg (04/06/2018), 2.5 mg (04/13/2018), 2.5 mg (04/21/2018), 2.5 mg (05/06/2018), 2.5 mg (05/11/2018), 2.5 mg (05/20/2018), 2.5 mg (05/27/2018), 2.5 mg (06/10/2018), 2.5 mg (06/17/2018), 2.5 mg (06/24/2018), 2.5 mg (07/08/2018), 2.5 mg (07/15/2018), 2.5 mg (07/22/2018)  for chemotherapy treatment.         CANCER STAGING: Cancer Staging No matching staging information was found for the patient.   INTERVAL HISTORY:  Brittany Archer 77 y.o. female returns for next weekly dose of Velcade.  She is taking Revlimid as directed.  She denies any GI side effects including nausea, vomiting or diarrhea.  She denies any tingling or numbness next remedies.  No fevers or infections were reported.  Denies any recent hospitalizations or ER visits.  No new onset pains were reported.  She takes potassium twice daily.  Energy levels are rated at 75%.   REVIEW OF SYSTEMS:  Review of Systems  Constitutional: Positive for fatigue.  All other systems reviewed and are negative.    PAST MEDICAL/SURGICAL HISTORY:  Past Medical History:  Diagnosis Date  . Breast cancer ([place])    left breast/ 2008/ surg/ rad tx  . Coronary artery disease   . Diabetes mellitus     Past Surgical History:  Procedure Laterality Date  . ABDOMINAL HYSTERECTOMY    . BREAST SURGERY       SOCIAL HISTORY:  Social History   Socioeconomic History  . Marital status: Divorced    Spouse name: Not on file  . Number of children: Not on file  . Years of education: Not on file  . Highest education level: Not on file  Occupational History  . Not on file  Social Needs  . Financial resource strain: Not on file  . Food insecurity:    Worry: Not on file    Inability: Not on file  . Transportation needs:    Medical: Not on file    Non-medical: Not on file  Tobacco Use  . Smoking status: Never Smoker  . Smokeless tobacco: Never Used  Substance and Sexual Activity  . Alcohol use: No  . Drug use: No  . Sexual activity: Yes    Birth control/protection: Surgical  Lifestyle  . Physical activity:    Days per week: Not on file    Minutes per session: Not on file  . Stress: Not on file  Relationships  . Social connections:    Talks on phone: Not on file    Gets together: Not on file    Attends religious service: Not on file    Active member of club or organization: Not on file    Attends meetings of clubs or organizations: Not on file    Relationship status: Not  on file  . Intimate partner violence:    Fear of current or ex partner: Not on file    Emotionally abused: Not on file    Physically abused: Not on file    Forced sexual activity: Not on file  Other Topics Concern  . Not on file  Social History Narrative  . Not on file    FAMILY HISTORY:  Family History  Problem Relation Age of Onset  . Obesity Sister     CURRENT MEDICATIONS:  Outpatient Encounter Medications as of 07/22/2018  Medication Sig  . acetaminophen (TYLENOL) 500 MG tablet Take 500 mg by mouth every 6 (six) hours as needed for mild pain or moderate pain.  Marland Kitchen acyclovir (ZOVIRAX) 400 MG tablet Take 1 tablet (400 mg total) by mouth 2 (two) times daily.  Marland Kitchen aspirin 81 MG tablet Take 81 mg by  mouth daily.    . bortezomib IV (VELCADE) 3.5 MG injection Inject into the vein once. weekly  . cholecalciferol (VITAMIN D) 1000 units tablet Take 1,000 Units by mouth daily.  . Denosumab (XGEVA [place]) Inject into the skin. Every 28 days  . dexamethasone (DECADRON) 4 MG tablet Take 10 tablets (40 mg) on days 1, 8, and 15 of chemo. Repeat every 21 days.  Marland Kitchen glipiZIDE (GLUCOTROL) 5 MG tablet   . lenalidomide (REVLIMID) 20 MG capsule Take one capsule daily on days 1-14 every 21 days.  Marland Kitchen lisinopril-hydrochlorothiazide (PRINZIDE,ZESTORETIC) 20-25 MG tablet   . metFORMIN (GLUCOPHAGE) 1000 MG tablet Take 1,000 mg by mouth 2 times daily at 12 noon and 4 pm.    . ondansetron (ZOFRAN) 8 MG tablet Take 1 tablet (8 mg total) by mouth 2 (two) times daily as needed (Nausea or vomiting).  . potassium chloride SA (K-DUR,KLOR-CON) 20 MEQ tablet Take 1 tablet (20 mEq total) by mouth daily.  . prochlorperazine (COMPAZINE) 10 MG tablet Take 1 tablet (10 mg total) by mouth every 6 (six) hours as needed (Nausea or vomiting).   No facility-administered encounter medications on file as of 07/22/2018.     ALLERGIES:  Allergies  Allergen Reactions  . Motrin [Ibuprofen] Rash     PHYSICAL EXAM:  ECOG Performance status: 1  Vitals:   07/22/18 1103  BP: (!) 118/47  Pulse: (!) 58  Resp: 16  Temp: 98.4 F (36.9 C)  SpO2: 100%   Filed Weights   07/22/18 1103  Weight: 171 lb (77.6 kg)    Physical Exam HEENT: No thrush or mucositis. Chest: Bilaterally clear to auscultation. CVS: S1-S2 regular rate and rhythm. Extremities: No edema or cyanosis.  LABORATORY DATA:  I have reviewed the labs as listed.  CBC    Component Value Date/Time   WBC 4.1 07/22/2018 1014   RBC 2.56 (L) 07/22/2018 1014   HGB 8.8 (L) 07/22/2018 1014   HCT 25.8 (L) 07/22/2018 1014   PLT 116 (L) 07/22/2018 1014   MCV 100.8 (H) 07/22/2018 1014   MCH 34.4 (H) 07/22/2018 1014   MCHC 34.1 07/22/2018 1014   RDW 18.4 (H) 07/22/2018 1014     LYMPHSABS 0.6 (L) 07/22/2018 1014   MONOABS 0.2 07/22/2018 1014   EOSABS 0.0 07/22/2018 1014   BASOSABS 0.0 07/22/2018 1014   CMP Latest Ref Rng & Units 07/22/2018 07/15/2018 07/08/2018  Glucose 70 - 99 mg/dL 106(H) 109(H) 170(H)  BUN 8 - 23 mg/dL '14 14 15  ' Creatinine 0.44 - 1.00 mg/dL 0.92 1.09(H) 0.94  Sodium 135 - 145 mmol/L 137 138 139  Potassium 3.5 -  5.1 mmol/L 3.6 3.1(L) 3.9  Chloride 98 - 111 mmol/L 107 104 105  CO2 22 - 32 mmol/L 21(L) 24 24  Calcium 8.9 - 10.3 mg/dL 8.0(L) 8.1(L) 8.8(L)  Total Protein 6.5 - 8.1 g/dL 6.7 7.1 7.1  Total Bilirubin 0.3 - 1.2 mg/dL 1.0 0.8 0.7  Alkaline Phos 38 - 126 U/L 57 55 62  AST 15 - 41 U/L 15 13(L) 13(L)  ALT 0 - 44 U/L '8 8 10          ' ASSESSMENT & PLAN:   Multiple myeloma not having achieved remission ([facility]) 1.  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: -Bone marrow biopsy on 01/03/2018 with 60% plasma cells, FISH panel with no abnormalities, chromosome analysis showing hyperdiploidy with gains of chromosomes 2, 3, 4, 7, 10, 15, beta-2 microglobulin 3.2, LDH normal, 0.9 g/dL of M spike at diagnosis, free light chain ratio of 114, Kappa Light chain of 1796 -Skeletal survey on 02/16/2018 showing subtle patchy areas of osteopenia of the thoracolumbar spine and possibly distal right clavicle which may reflect subtle changes of multiple myeloma - RVD (Velcade on days 1, 8 and 15, Revlimid 25 on days 1-21, dexamethasone 10 pills on day of Velcade every 28 days) cycle 1 started on 01/26/2018, cycle 4 on 04/13/2018, she is asynchronous with Revlimid and Velcade.  We have held her Revlimid once when [place] dropped to 600 during cycle 4. - She had drop in her white count in the past.  If there is any further drop of ANC, we will consider decreasing Revlimid dose to 20 mg daily. - We discussed the results of the M spike on 07/15/2018 which has come down to 0.4.  Previously this was 0.5.  Free light chain ratio was 1.86, slightly up  from 1.64.  Hence we will continue the same regimen at this time.  She finished her last Revlimid pill yesterday.  She will have the next week off of Revlimid.  I will see her back in 6 weeks with repeat myeloma work-up 1 week prior.  2.  Bone strengthening: She will continue monthly denosumab injections.  She will continue calcium and vitamin D twice daily.  3.  Hypokalemia: Today her potassium is within normal limits.  She will continue potassium 20 mEq twice a day.      Orders placed this encounter:  Orders Placed This Encounter  Procedures  . Protein electrophoresis, serum  . Kappa/lambda light chains      Derek Jack, [place] [phone]

## 2018-08-05 ENCOUNTER — Inpatient Hospital Stay (HOSPITAL_COMMUNITY): Payer: Medicare Other | Attending: Hematology

## 2018-08-05 ENCOUNTER — Encounter (HOSPITAL_COMMUNITY): Payer: Self-pay

## 2018-08-05 ENCOUNTER — Inpatient Hospital Stay (HOSPITAL_COMMUNITY): Payer: Medicare Other

## 2018-08-05 ENCOUNTER — Other Ambulatory Visit: Payer: Self-pay

## 2018-08-05 VITALS — BP 122/45 | HR 61 | Temp 98.2°F | Resp 18 | Wt 170.6 lb

## 2018-08-05 DIAGNOSIS — C9000 Multiple myeloma not having achieved remission: Secondary | ICD-10-CM | POA: Diagnosis present

## 2018-08-05 DIAGNOSIS — E538 Deficiency of other specified B group vitamins: Secondary | ICD-10-CM | POA: Insufficient documentation

## 2018-08-05 DIAGNOSIS — Z5112 Encounter for antineoplastic immunotherapy: Secondary | ICD-10-CM | POA: Insufficient documentation

## 2018-08-05 DIAGNOSIS — Z5111 Encounter for antineoplastic chemotherapy: Secondary | ICD-10-CM

## 2018-08-05 DIAGNOSIS — D472 Monoclonal gammopathy: Secondary | ICD-10-CM

## 2018-08-05 LAB — COMPREHENSIVE METABOLIC PANEL
ALT: 8 U/L (ref 0–44)
AST: 12 U/L — ABNORMAL LOW (ref 15–41)
Albumin: 3.7 g/dL (ref 3.5–5.0)
Alkaline Phosphatase: 51 U/L (ref 38–126)
Anion gap: 8 (ref 5–15)
BILIRUBIN TOTAL: 0.8 mg/dL (ref 0.3–1.2)
BUN: 14 mg/dL (ref 8–23)
CO2: 24 mmol/L (ref 22–32)
Calcium: 8.6 mg/dL — ABNORMAL LOW (ref 8.9–10.3)
Chloride: 108 mmol/L (ref 98–111)
Creatinine, Ser: 1.03 mg/dL — ABNORMAL HIGH (ref 0.44–1.00)
GFR, EST AFRICAN AMERICAN: 59 mL/min — AB (ref >60)
GFR, EST NON AFRICAN AMERICAN: 51 mL/min — AB (ref >60)
Glucose, Bld: 69 mg/dL — ABNORMAL LOW (ref 70–99)
POTASSIUM: 3.5 mmol/L (ref 3.5–5.1)
Sodium: 140 mmol/L (ref 135–145)
TOTAL PROTEIN: 6.9 g/dL (ref 6.5–8.1)

## 2018-08-05 LAB — CBC WITH DIFFERENTIAL/PLATELET
BASOS ABS: 0.1 10*3/uL (ref 0.0–0.1)
Basophils Relative: 2 %
Eosinophils Absolute: 0.2 10*3/uL (ref 0.0–0.7)
Eosinophils Relative: 7 %
HEMATOCRIT: 25 % — AB (ref 36.0–46.0)
Hemoglobin: 8.3 g/dL — ABNORMAL LOW (ref 12.0–15.0)
LYMPHS ABS: 1 10*3/uL (ref 0.7–4.0)
LYMPHS PCT: 32 %
MCH: 34.6 pg — AB (ref 26.0–34.0)
MCHC: 33.2 g/dL (ref 30.0–36.0)
MCV: 104.2 fL — AB (ref 78.0–100.0)
MONOS PCT: 6 %
Monocytes Absolute: 0.2 10*3/uL (ref 0.1–1.0)
NEUTROS ABS: 1.7 10*3/uL (ref 1.7–7.7)
Neutrophils Relative %: 53 %
Platelets: 157 10*3/uL (ref 150–400)
RBC: 2.4 MIL/uL — ABNORMAL LOW (ref 3.87–5.11)
RDW: 17.3 % — AB (ref 11.5–15.5)
WBC: 3.2 10*3/uL — ABNORMAL LOW (ref 4.0–10.5)

## 2018-08-05 MED ORDER — BORTEZOMIB CHEMO SQ INJECTION 3.5 MG (2.5MG/ML)
1.3000 mg/m2 | Freq: Once | INTRAMUSCULAR | Status: AC
  Administered 2018-08-05: 2.5 mg via SUBCUTANEOUS
  Filled 2018-08-05: qty 1

## 2018-08-05 MED ORDER — DENOSUMAB 120 MG/1.7ML ~~LOC~~ SOLN
120.0000 mg | Freq: Once | SUBCUTANEOUS | Status: AC
  Administered 2018-08-05: 120 mg via SUBCUTANEOUS
  Filled 2018-08-05: qty 1.7

## 2018-08-05 MED ORDER — CYANOCOBALAMIN 1000 MCG/ML IJ SOLN
1000.0000 ug | Freq: Once | INTRAMUSCULAR | Status: AC
  Administered 2018-08-05: 1000 ug via INTRAMUSCULAR
  Filled 2018-08-05: qty 1

## 2018-08-05 MED ORDER — PROCHLORPERAZINE MALEATE 10 MG PO TABS
10.0000 mg | ORAL_TABLET | Freq: Once | ORAL | Status: AC
  Administered 2018-08-05: 10 mg via ORAL
  Filled 2018-08-05: qty 1

## 2018-08-05 NOTE — Progress Notes (Signed)
Labs reviewed with Dr. Delton Coombes today for velcade. Proceed with treatment today. Given information on xgeva today regarding taking calcium and vitamin D. Patient and sister understood. B12 , xgeva and velcade given as ordered. See MAR for details.   Patient tolerated it well without problems. Vitals stable and discharged home from clinic ambulatory. Follow up as scheduled.

## 2018-08-05 NOTE — Progress Notes (Signed)
08/05/18 @ [facility], RN confirms patient has not been taking calcium supplement and will discuss with patient to start calcium supplements and provide written education.  V.O. Forest Gleason, RN/[facility] [place] Ronnald Ramp, PharmD

## 2018-08-05 NOTE — Patient Instructions (Signed)
[facility] Discharge Instructions for Patients Receiving Chemotherapy   Beginning January 23rd 2017 lab work for the Cancer Center will be done in the  Main lab at [facility] on 1st floor. If you have a lab appointment with the Cancer Center please come in thru the  Main Entrance and check in at the main information desk   Today you received the following chemotherapy agents   To help prevent nausea and vomiting after your treatment, we encourage you to take your nausea medication     If you develop nausea and vomiting, or diarrhea that is not controlled by your medication, call the clinic.  The clinic phone number is [phone]. Office hours are Monday-Friday 8:30am-5:00pm.  BELOW ARE SYMPTOMS THAT SHOULD BE REPORTED IMMEDIATELY:  *FEVER GREATER THAN 101.0 F  *CHILLS WITH OR WITHOUT FEVER  NAUSEA AND VOMITING THAT IS NOT CONTROLLED WITH YOUR NAUSEA MEDICATION  *UNUSUAL SHORTNESS OF BREATH  *UNUSUAL BRUISING OR BLEEDING  TENDERNESS IN MOUTH AND THROAT WITH OR WITHOUT PRESENCE OF ULCERS  *URINARY PROBLEMS  *BOWEL PROBLEMS  UNUSUAL RASH Items with * indicate a potential emergency and should be followed up as soon as possible. If you have an emergency after office hours please contact your primary care physician or go to the nearest emergency department.  Please call the clinic during office hours if you have any questions or concerns.   You may also contact the Patient Navigator at [phone] should you have any questions or need assistance in obtaining follow up care.      Resources For Cancer Patients and their Caregivers ? American Cancer Society: Can assist with transportation, wigs, general needs, runs Look Good Feel Better.        1-888-227-6333 ? Cancer Care: Provides financial assistance, online support groups, medication/co-pay assistance.  1-800-813-HOPE (4673) ? [facility] Assists [place] cancer  patients and their families through emotional , educational and financial support.  336-427-4357 ? [place] DSS Where to apply for food stamps, Medicaid and utility assistance. 336-342-1394 ? [facility]: Transportation to medical appointments. 336-347-2287 ? Social Security Administration: May apply for disability if have a Stage IV cancer. 336-342-7796 1-800-772-1213 ? [place] Aging, Disability and Transit Services: Assists with nutrition, care and transit needs. 336-349-2343         

## 2018-08-09 ENCOUNTER — Other Ambulatory Visit (HOSPITAL_COMMUNITY): Payer: Self-pay

## 2018-08-09 DIAGNOSIS — Z5111 Encounter for antineoplastic chemotherapy: Secondary | ICD-10-CM

## 2018-08-09 DIAGNOSIS — C9000 Multiple myeloma not having achieved remission: Secondary | ICD-10-CM

## 2018-08-11 ENCOUNTER — Other Ambulatory Visit (HOSPITAL_COMMUNITY): Payer: Self-pay | Admitting: *Deleted

## 2018-08-11 DIAGNOSIS — C9000 Multiple myeloma not having achieved remission: Secondary | ICD-10-CM

## 2018-08-11 MED ORDER — LENALIDOMIDE 20 MG PO CAPS: ORAL_CAPSULE | ORAL | 0 refills | Status: DC

## 2018-08-11 NOTE — Telephone Encounter (Signed)
Chart reviewed, revlimid refilled. 

## 2018-08-12 ENCOUNTER — Inpatient Hospital Stay (HOSPITAL_COMMUNITY): Payer: Medicare Other

## 2018-08-12 ENCOUNTER — Encounter (HOSPITAL_COMMUNITY): Payer: Self-pay

## 2018-08-12 ENCOUNTER — Other Ambulatory Visit: Payer: Self-pay

## 2018-08-12 VITALS — BP 101/43 | HR 60 | Temp 97.5°F | Resp 18 | Wt 168.6 lb

## 2018-08-12 DIAGNOSIS — C9000 Multiple myeloma not having achieved remission: Secondary | ICD-10-CM

## 2018-08-12 DIAGNOSIS — Z5112 Encounter for antineoplastic immunotherapy: Secondary | ICD-10-CM | POA: Diagnosis not present

## 2018-08-12 DIAGNOSIS — Z5111 Encounter for antineoplastic chemotherapy: Secondary | ICD-10-CM

## 2018-08-12 DIAGNOSIS — D649 Anemia, unspecified: Secondary | ICD-10-CM

## 2018-08-12 LAB — CBC WITH DIFFERENTIAL/PLATELET
Basophils Absolute: 0 10*3/uL (ref 0.0–0.1)
Basophils Relative: 1 %
EOS PCT: 6 %
Eosinophils Absolute: 0.2 10*3/uL (ref 0.0–0.7)
HCT: 23.4 % — ABNORMAL LOW (ref 36.0–46.0)
Hemoglobin: 7.8 g/dL — ABNORMAL LOW (ref 12.0–15.0)
LYMPHS ABS: 0.9 10*3/uL (ref 0.7–4.0)
LYMPHS PCT: 38 %
MCH: 34.2 pg — AB (ref 26.0–34.0)
MCHC: 33.3 g/dL (ref 30.0–36.0)
MCV: 102.6 fL — AB (ref 78.0–100.0)
MONO ABS: 0.3 10*3/uL (ref 0.1–1.0)
MONOS PCT: 12 %
Neutro Abs: 1 10*3/uL — ABNORMAL LOW (ref 1.7–7.7)
Neutrophils Relative %: 43 %
Platelets: 136 10*3/uL — ABNORMAL LOW (ref 150–400)
RBC: 2.28 MIL/uL — ABNORMAL LOW (ref 3.87–5.11)
RDW: 17.6 % — ABNORMAL HIGH (ref 11.5–15.5)
WBC: 2.4 10*3/uL — ABNORMAL LOW (ref 4.0–10.5)

## 2018-08-12 LAB — COMPREHENSIVE METABOLIC PANEL
ALK PHOS: 51 U/L (ref 38–126)
ALT: 9 U/L (ref 0–44)
ANION GAP: 8 (ref 5–15)
AST: 14 U/L — ABNORMAL LOW (ref 15–41)
Albumin: 3.8 g/dL (ref 3.5–5.0)
BUN: 12 mg/dL (ref 8–23)
CALCIUM: 7.6 mg/dL — AB (ref 8.9–10.3)
CO2: 22 mmol/L (ref 22–32)
CREATININE: 0.9 mg/dL (ref 0.44–1.00)
Chloride: 110 mmol/L (ref 98–111)
GFR, EST NON AFRICAN AMERICAN: 60 mL/min — AB (ref >60)
Glucose, Bld: 61 mg/dL — ABNORMAL LOW (ref 70–99)
Potassium: 3.4 mmol/L — ABNORMAL LOW (ref 3.5–5.1)
Sodium: 140 mmol/L (ref 135–145)
Total Bilirubin: 1.2 mg/dL (ref 0.3–1.2)
Total Protein: 6.7 g/dL (ref 6.5–8.1)

## 2018-08-12 LAB — IRON AND TIBC
Iron: 61 ug/dL (ref 28–170)
SATURATION RATIOS: 44 % — AB (ref 10.4–31.8)
TIBC: 139 ug/dL — ABNORMAL LOW (ref 250–450)
UIBC: 78 ug/dL

## 2018-08-12 LAB — ABO/RH: ABO/RH(D): O POS

## 2018-08-12 LAB — PREPARE RBC (CROSSMATCH)

## 2018-08-12 LAB — FERRITIN: FERRITIN: 367 ng/mL — AB (ref 11–307)

## 2018-08-12 MED ORDER — PROCHLORPERAZINE MALEATE 10 MG PO TABS
10.0000 mg | ORAL_TABLET | Freq: Once | ORAL | Status: AC
  Administered 2018-08-12: 10 mg via ORAL
  Filled 2018-08-12: qty 1

## 2018-08-12 MED ORDER — BORTEZOMIB CHEMO SQ INJECTION 3.5 MG (2.5MG/ML)
1.3000 mg/m2 | Freq: Once | INTRAMUSCULAR | Status: AC
  Administered 2018-08-12: 2.5 mg via SUBCUTANEOUS
  Filled 2018-08-12: qty 1

## 2018-08-12 NOTE — Progress Notes (Signed)
Pt reports numbness bilateral to toes, off and on, onset several days ago.  This was told to Dr. Delton Coombes, as well as lab results - ANC 1000, K+ 3.4, and hgb 7.8.  Okay to proceed with Velcade injection today per MD.  Orders rec'd for add on labs of iron/TIBC, ferritin, and to transfuse 1 unit of blood on Monday, 08/15/18.    Brittany Archer presents today for injection per the provider's orders.  Velcade administration without incident; see MAR for injection details.  Patient tolerated procedure well and without incident.  No questions or complaints noted at this time.  Discharged ambulatory in c/o family.

## 2018-08-15 ENCOUNTER — Other Ambulatory Visit: Payer: Self-pay

## 2018-08-15 ENCOUNTER — Encounter (HOSPITAL_COMMUNITY): Payer: Self-pay

## 2018-08-15 ENCOUNTER — Inpatient Hospital Stay (HOSPITAL_COMMUNITY): Payer: Medicare Other

## 2018-08-15 DIAGNOSIS — C9000 Multiple myeloma not having achieved remission: Secondary | ICD-10-CM

## 2018-08-15 DIAGNOSIS — D649 Anemia, unspecified: Secondary | ICD-10-CM

## 2018-08-15 DIAGNOSIS — Z5112 Encounter for antineoplastic immunotherapy: Secondary | ICD-10-CM | POA: Diagnosis not present

## 2018-08-15 MED ORDER — ACETAMINOPHEN 325 MG PO TABS
ORAL_TABLET | ORAL | Status: AC
  Filled 2018-08-15: qty 2

## 2018-08-15 MED ORDER — SODIUM CHLORIDE 0.9% IV SOLUTION
250.0000 mL | Freq: Once | INTRAVENOUS | Status: AC
  Administered 2018-08-15: 250 mL via INTRAVENOUS

## 2018-08-15 MED ORDER — DIPHENHYDRAMINE HCL 25 MG PO CAPS
ORAL_CAPSULE | ORAL | Status: AC
  Filled 2018-08-15: qty 1

## 2018-08-15 MED ORDER — ACETAMINOPHEN 325 MG PO TABS
650.0000 mg | ORAL_TABLET | Freq: Once | ORAL | Status: AC
  Administered 2018-08-15: 650 mg via ORAL

## 2018-08-15 MED ORDER — DIPHENHYDRAMINE HCL 25 MG PO CAPS
25.0000 mg | ORAL_CAPSULE | Freq: Once | ORAL | Status: AC
  Administered 2018-08-15: 25 mg via ORAL

## 2018-08-15 NOTE — Progress Notes (Signed)
Patient presented for one unit of blood today per orders. Consent signed.     Patient tolerated it well without problems. Vitals stable and discharged home from clinic ambulatory. Follow up as scheduled.

## 2018-08-15 NOTE — Patient Instructions (Signed)
[facility] at [facility] Discharge Instructions  One unit of blood given today Follow up as scheduled   Thank you for choosing [facility] at [facility] to provide your oncology and hematology care.  To afford each patient quality time with our provider, please arrive at least 15 minutes before your scheduled appointment time.   If you have a lab appointment with the Cancer Center please come in thru the  Main Entrance and check in at the main information desk  You need to re-schedule your appointment should you arrive 10 or more minutes late.  We strive to give you quality time with our providers, and arriving late affects you and other patients whose appointments are after yours.  Also, if you no show three or more times for appointments you may be dismissed from the clinic at the providers discretion.     Again, thank you for choosing [facility].  Our hope is that these requests will decrease the amount of time that you wait before being seen by our physicians.       _____________________________________________________________  Should you have questions after your visit to [facility], please contact our office at [phone] between the hours of 8:00 a.m. and 4:30 p.m.  Voicemails left after 4:00 p.m. will not be returned until the following business day.  For prescription refill requests, have your pharmacy contact our office and allow 72 hours.    Cancer Center Support Programs:   > Cancer Support Group  2nd Tuesday of the month 1pm-2pm, Journey Room   

## 2018-08-16 LAB — TYPE AND SCREEN
ABO/RH(D): O POS
Antibody Screen: NEGATIVE
Unit division: 0

## 2018-08-16 LAB — BPAM RBC
Blood Product Expiration Date: 201909092359
ISSUE DATE / TIME: 201908191106
Unit Type and Rh: 5100

## 2018-08-19 ENCOUNTER — Inpatient Hospital Stay (HOSPITAL_COMMUNITY): Payer: Medicare Other

## 2018-08-19 DIAGNOSIS — C9000 Multiple myeloma not having achieved remission: Secondary | ICD-10-CM

## 2018-08-19 DIAGNOSIS — Z5112 Encounter for antineoplastic immunotherapy: Secondary | ICD-10-CM | POA: Diagnosis not present

## 2018-08-19 DIAGNOSIS — Z5111 Encounter for antineoplastic chemotherapy: Secondary | ICD-10-CM

## 2018-08-19 LAB — COMPREHENSIVE METABOLIC PANEL
ALT: 9 U/L (ref 0–44)
AST: 12 U/L — AB (ref 15–41)
Albumin: 3.8 g/dL (ref 3.5–5.0)
Alkaline Phosphatase: 58 U/L (ref 38–126)
Anion gap: 9 (ref 5–15)
BUN: 17 mg/dL (ref 8–23)
CHLORIDE: 109 mmol/L (ref 98–111)
CO2: 21 mmol/L — AB (ref 22–32)
CREATININE: 0.99 mg/dL (ref 0.44–1.00)
Calcium: 7.3 mg/dL — ABNORMAL LOW (ref 8.9–10.3)
GFR calc non Af Amer: 53 mL/min — ABNORMAL LOW (ref >60)
Glucose, Bld: 67 mg/dL — ABNORMAL LOW (ref 70–99)
Potassium: 3.5 mmol/L (ref 3.5–5.1)
SODIUM: 139 mmol/L (ref 135–145)
Total Bilirubin: 1.1 mg/dL (ref 0.3–1.2)
Total Protein: 6.9 g/dL (ref 6.5–8.1)

## 2018-08-19 LAB — CBC WITH DIFFERENTIAL/PLATELET
BASOS PCT: 1 %
Basophils Absolute: 0 10*3/uL (ref 0.0–0.1)
EOS PCT: 4 %
Eosinophils Absolute: 0.1 10*3/uL (ref 0.0–0.7)
HCT: 26.8 % — ABNORMAL LOW (ref 36.0–46.0)
Hemoglobin: 8.9 g/dL — ABNORMAL LOW (ref 12.0–15.0)
Lymphocytes Relative: 51 %
Lymphs Abs: 1 10*3/uL (ref 0.7–4.0)
MCH: 33.5 pg (ref 26.0–34.0)
MCHC: 33.2 g/dL (ref 30.0–36.0)
MCV: 100.8 fL — ABNORMAL HIGH (ref 78.0–100.0)
Monocytes Absolute: 0.3 10*3/uL (ref 0.1–1.0)
Monocytes Relative: 18 %
Neutro Abs: 0.5 10*3/uL — ABNORMAL LOW (ref 1.7–7.7)
Neutrophils Relative %: 26 %
PLATELETS: 151 10*3/uL (ref 150–400)
RBC: 2.66 MIL/uL — AB (ref 3.87–5.11)
RDW: 17.5 % — ABNORMAL HIGH (ref 11.5–15.5)
WBC: 1.9 10*3/uL — AB (ref 4.0–10.5)

## 2018-08-19 MED ORDER — DEXAMETHASONE 4 MG PO TABS: ORAL_TABLET | ORAL | 3 refills | Status: DC

## 2018-08-19 NOTE — Patient Instructions (Signed)
[facility] at [facility] Discharge Instructions  Hold Revlimid till next week when you see doctor again. Will hold velcade this week.  Take one vitamin D tablet twice a day.  Start Taking your decadron again on day 1, 8, and 15 (same days of velcade)   Thank you for choosing [place] at [facility] to provide your oncology and hematology care.  To afford each patient quality time with our provider, please arrive at least 15 minutes before your scheduled appointment time.   If you have a lab appointment with the [facility] please come in thru the  Main Entrance and check in at the main information desk  You need to re-schedule your appointment should you arrive 10 or more minutes late.  We strive to give you quality time with our providers, and arriving late affects you and other patients whose appointments are after yours.  Also, if you no show three or more times for appointments you may be dismissed from the clinic at the providers discretion.     Again, thank you for choosing [facility].  Our hope is that these requests will decrease the amount of time that you wait before being seen by our physicians.       _____________________________________________________________  Should you have questions after your visit to [facility], please contact our office at [phone] between the hours of 8:00 a.m. and 4:30 p.m.  Voicemails left after 4:00 p.m. will not be returned until the following business day.  For prescription refill requests, have your pharmacy contact our office and allow 72 hours.    Cancer Center Support Programs:   > Cancer Support Group  2nd Tuesday of the month 1pm-2pm, Journey Room

## 2018-08-19 NOTE — Progress Notes (Signed)
Labs reviewed with Dr. Delton Coombes today. WBC 1.9, Neutrophils 0.5. Will hold velcade today. This week was her week off revlimid. Patient stated she is supposed to receive the medication this week. Instructed patient and her sister to not take the revlimid till she comes back next week and sees the doctor. I also reordered her prednisone that she was not taking and instructed her and the sister on how to take this. I told her she was supposed to be taking the calcium/vitD twice a day. Her calcium runs really low.   Patient verbalized understanding and I gave them a new scheduled.   Vitals stable and discharged home from clinic ambulatory. Follow up as scheduled.

## 2018-08-22 LAB — PROTEIN ELECTROPHORESIS, SERUM
A/G RATIO SPE: 1.3 (ref 0.7–1.7)
Albumin ELP: 3.6 g/dL (ref 2.9–4.4)
Alpha-1-Globulin: 0.2 g/dL (ref 0.0–0.4)
Alpha-2-Globulin: 0.8 g/dL (ref 0.4–1.0)
Beta Globulin: 1 g/dL (ref 0.7–1.3)
GLOBULIN, TOTAL: 2.8 g/dL (ref 2.2–3.9)
Gamma Globulin: 0.8 g/dL (ref 0.4–1.8)
TOTAL PROTEIN ELP: 6.4 g/dL (ref 6.0–8.5)

## 2018-08-22 LAB — KAPPA/LAMBDA LIGHT CHAINS
KAPPA FREE LGHT CHN: 35.3 mg/L — AB (ref 3.3–19.4)
Kappa, lambda light chain ratio: 2.1 — ABNORMAL HIGH (ref 0.26–1.65)
LAMDA FREE LIGHT CHAINS: 16.8 mg/L (ref 5.7–26.3)

## 2018-09-02 ENCOUNTER — Inpatient Hospital Stay (HOSPITAL_BASED_OUTPATIENT_CLINIC_OR_DEPARTMENT_OTHER): Payer: Medicare Other | Admitting: Hematology

## 2018-09-02 ENCOUNTER — Inpatient Hospital Stay (HOSPITAL_COMMUNITY): Payer: Medicare Other | Attending: Hematology

## 2018-09-02 ENCOUNTER — Other Ambulatory Visit: Payer: Self-pay

## 2018-09-02 ENCOUNTER — Encounter (HOSPITAL_COMMUNITY): Payer: Self-pay | Admitting: Hematology

## 2018-09-02 ENCOUNTER — Inpatient Hospital Stay (HOSPITAL_COMMUNITY): Payer: Medicare Other

## 2018-09-02 VITALS — BP 112/44 | HR 53 | Temp 98.2°F | Resp 18 | Wt 163.6 lb

## 2018-09-02 DIAGNOSIS — Z5112 Encounter for antineoplastic immunotherapy: Secondary | ICD-10-CM | POA: Diagnosis present

## 2018-09-02 DIAGNOSIS — C9000 Multiple myeloma not having achieved remission: Secondary | ICD-10-CM | POA: Diagnosis present

## 2018-09-02 DIAGNOSIS — Z79899 Other long term (current) drug therapy: Secondary | ICD-10-CM | POA: Insufficient documentation

## 2018-09-02 DIAGNOSIS — E876 Hypokalemia: Secondary | ICD-10-CM | POA: Diagnosis not present

## 2018-09-02 DIAGNOSIS — R200 Anesthesia of skin: Secondary | ICD-10-CM

## 2018-09-02 DIAGNOSIS — Z5111 Encounter for antineoplastic chemotherapy: Secondary | ICD-10-CM

## 2018-09-02 DIAGNOSIS — E538 Deficiency of other specified B group vitamins: Secondary | ICD-10-CM

## 2018-09-02 LAB — CBC WITH DIFFERENTIAL/PLATELET
Basophils Absolute: 0 10*3/uL (ref 0.0–0.1)
Basophils Relative: 1 %
EOS PCT: 3 %
Eosinophils Absolute: 0.1 10*3/uL (ref 0.0–0.7)
HCT: 27.2 % — ABNORMAL LOW (ref 36.0–46.0)
Hemoglobin: 8.9 g/dL — ABNORMAL LOW (ref 12.0–15.0)
LYMPHS ABS: 1 10*3/uL (ref 0.7–4.0)
LYMPHS PCT: 23 %
MCH: 33.7 pg (ref 26.0–34.0)
MCHC: 32.7 g/dL (ref 30.0–36.0)
MCV: 103 fL — AB (ref 78.0–100.0)
MONO ABS: 0.3 10*3/uL (ref 0.1–1.0)
MONOS PCT: 7 %
Neutro Abs: 2.9 10*3/uL (ref 1.7–7.7)
Neutrophils Relative %: 66 %
PLATELETS: 187 10*3/uL (ref 150–400)
RBC: 2.64 MIL/uL — AB (ref 3.87–5.11)
RDW: 16.3 % — ABNORMAL HIGH (ref 11.5–15.5)
WBC: 4.3 10*3/uL (ref 4.0–10.5)

## 2018-09-02 LAB — COMPREHENSIVE METABOLIC PANEL
ALT: 6 U/L (ref 0–44)
AST: 9 U/L — ABNORMAL LOW (ref 15–41)
Albumin: 3.8 g/dL (ref 3.5–5.0)
Alkaline Phosphatase: 53 U/L (ref 38–126)
Anion gap: 10 (ref 5–15)
BUN: 15 mg/dL (ref 8–23)
CALCIUM: 8.7 mg/dL — AB (ref 8.9–10.3)
CHLORIDE: 107 mmol/L (ref 98–111)
CO2: 21 mmol/L — ABNORMAL LOW (ref 22–32)
CREATININE: 0.93 mg/dL (ref 0.44–1.00)
GFR, EST NON AFRICAN AMERICAN: 57 mL/min — AB (ref >60)
Glucose, Bld: 62 mg/dL — ABNORMAL LOW (ref 70–99)
Potassium: 3.2 mmol/L — ABNORMAL LOW (ref 3.5–5.1)
Sodium: 138 mmol/L (ref 135–145)
Total Bilirubin: 0.8 mg/dL (ref 0.3–1.2)
Total Protein: 7 g/dL (ref 6.5–8.1)

## 2018-09-02 MED ORDER — PROCHLORPERAZINE MALEATE 10 MG PO TABS
ORAL_TABLET | ORAL | Status: AC
  Filled 2018-09-02: qty 1

## 2018-09-02 MED ORDER — CYANOCOBALAMIN 1000 MCG/ML IJ SOLN
1000.0000 ug | Freq: Once | INTRAMUSCULAR | Status: AC
  Administered 2018-09-02: 1000 ug via INTRAMUSCULAR
  Filled 2018-09-02: qty 1

## 2018-09-02 MED ORDER — DENOSUMAB 120 MG/1.7ML ~~LOC~~ SOLN
120.0000 mg | Freq: Once | SUBCUTANEOUS | Status: AC
  Administered 2018-09-02: 120 mg via SUBCUTANEOUS
  Filled 2018-09-02: qty 1.7

## 2018-09-02 MED ORDER — PROCHLORPERAZINE MALEATE 10 MG PO TABS
10.0000 mg | ORAL_TABLET | Freq: Once | ORAL | Status: AC
  Administered 2018-09-02: 10 mg via ORAL

## 2018-09-02 MED ORDER — BORTEZOMIB CHEMO SQ INJECTION 3.5 MG (2.5MG/ML)
1.3000 mg/m2 | Freq: Once | INTRAMUSCULAR | Status: AC
  Administered 2018-09-02: 2.5 mg via SUBCUTANEOUS
  Filled 2018-09-02: qty 1

## 2018-09-02 NOTE — Progress Notes (Signed)
Patient seen by oncologist with lab review and ok to treat today verbal order Dr. Delton Coombes.   Patient taking calcium as directed with no jaw, tooth, or leg pain.  No recent or upcoming dental visits.  Patient tolerated injections with no complaints voiced.  Sites clean and dry with no bruising or swelling noted at site.  Band aids applied.  VSs with discharge and left ambulatory with no s/s of distress noted.

## 2018-09-02 NOTE — Patient Instructions (Signed)
[facility] at [facility] Discharge Instructions  Follow up in 6 weeks with labs. Continue your normal treatment schedule.    Thank you for choosing  [facility] at [facility] to provide your oncology and hematology care.  To afford each patient quality time with our provider, please arrive at least 15 minutes before your scheduled appointment time.   If you have a lab appointment with the Cancer Center please come in thru the  Main Entrance and check in at the main information desk  You need to re-schedule your appointment should you arrive 10 or more minutes late.  We strive to give you quality time with our providers, and arriving late affects you and other patients whose appointments are after yours.  Also, if you no show three or more times for appointments you may be dismissed from the clinic at the providers discretion.     Again, thank you for choosing [facility].  Our hope is that these requests will decrease the amount of time that you wait before being seen by our physicians.       _____________________________________________________________  Should you have questions after your visit to [facility], please contact our office at [phone] between the hours of 8:00 a.m. and 4:30 p.m.  Voicemails left after 4:00 p.m. will not be returned until the following business day.  For prescription refill requests, have your pharmacy contact our office and allow 72 hours.    Cancer Center Support Programs:   > Cancer Support Group  2nd Tuesday of the month 1pm-2pm, Journey Room    

## 2018-09-02 NOTE — Progress Notes (Signed)
[place], [place] [address]   CLINIC:  Medical Oncology/Hematology  PCP:  Vesta Mixer [address] [place] [address] [phone]   REASON FOR VISIT:  Follow-up for multiple myeloma  CURRENT THERAPY: Valcade, Revlimd, and dexamethasone every 3 weeks  BRIEF ONCOLOGIC HISTORY:    Multiple myeloma not having achieved remission ([facility])   01/20/2018 Initial Diagnosis    Multiple myeloma not having achieved remission ([facility])    01/23/2018 -  Chemotherapy    The patient had bortezomib SQ (VELCADE) chemo injection 2.5 mg, 1.3 mg/m2 = 2.5 mg, Subcutaneous,  Once, 9 of 12 cycles Administration: 2.5 mg (01/26/2018), 2.5 mg (02/02/2018), 2.5 mg (02/09/2018), 2.5 mg (02/16/2018), 2.5 mg (02/23/2018), 2.5 mg (03/02/2018), 2.5 mg (03/09/2018), 2.5 mg (03/30/2018), 2.5 mg (04/06/2018), 2.5 mg (04/13/2018), 2.5 mg (04/21/2018), 2.5 mg (05/06/2018), 2.5 mg (05/11/2018), 2.5 mg (05/20/2018), 2.5 mg (05/27/2018), 2.5 mg (06/10/2018), 2.5 mg (06/17/2018), 2.5 mg (06/24/2018), 2.5 mg (07/08/2018), 2.5 mg (07/15/2018), 2.5 mg (07/22/2018), 2.5 mg (08/05/2018), 2.5 mg (08/12/2018), 2.5 mg (09/02/2018)  for chemotherapy treatment.       INTERVAL HISTORY:  Brittany Archer 77 y.o. female returns for routine follow-up for multiple myeloma. Patient is here today with her daughter. She is doing well and tolerating her treatment well. She does report numbness in her feet but this time stable at this time. She had one episode of diarrhea last night but that was the only time. Denies any fevers or infections. Denies any new pains. Denies any nausea, vomiting, or constipation. She reports her appetite and energy level at 75%. She is maintaining her weight well.    REVIEW OF SYSTEMS:  Review of Systems  Neurological: Positive for dizziness and numbness.  All other systems reviewed and are negative.    PAST MEDICAL/SURGICAL HISTORY:  Past Medical History:  Diagnosis Date  . Breast  cancer ([facility])    left breast/ 2008/ surg/ rad tx  . Coronary artery disease   . Diabetes mellitus    Past Surgical History:  Procedure Laterality Date  . ABDOMINAL HYSTERECTOMY    . BREAST SURGERY       SOCIAL HISTORY:  Social History   Socioeconomic History  . Marital status: Divorced    Spouse name: Not on file  . Number of children: Not on file  . Years of education: Not on file  . Highest education level: Not on file  Occupational History  . Not on file  Social Needs  . Financial resource strain: Not on file  . Food insecurity:    Worry: Not on file    Inability: Not on file  . Transportation needs:    Medical: Not on file    Non-medical: Not on file  Tobacco Use  . Smoking status: Never Smoker  . Smokeless tobacco: Never Used  Substance and Sexual Activity  . Alcohol use: No  . Drug use: No  . Sexual activity: Yes    Birth control/protection: Surgical  Lifestyle  . Physical activity:    Days per week: Not on file    Minutes per session: Not on file  . Stress: Not on file  Relationships  . Social connections:    Talks on phone: Not on file    Gets together: Not on file    Attends religious service: Not on file    Active member of club or organization: Not on file    Attends meetings of clubs or  organizations: Not on file    Relationship status: Not on file  . Intimate partner violence:    Fear of current or ex partner: Not on file    Emotionally abused: Not on file    Physically abused: Not on file    Forced sexual activity: Not on file  Other Topics Concern  . Not on file  Social History Narrative  . Not on file    FAMILY HISTORY:  Family History  Problem Relation Age of Onset  . Obesity Sister     CURRENT MEDICATIONS:  Outpatient Encounter Medications as of 09/02/2018  Medication Sig  . acetaminophen (TYLENOL) 500 MG tablet Take 500 mg by mouth every 6 (six) hours as needed for mild pain or moderate pain.  Marland Kitchen acyclovir (ZOVIRAX) 400 MG tablet  Take 1 tablet (400 mg total) by mouth 2 (two) times daily.  Marland Kitchen aspirin 81 MG tablet Take 81 mg by mouth daily.    . bortezomib IV (VELCADE) 3.5 MG injection Inject into the vein once. weekly  . cholecalciferol (VITAMIN D) 1000 units tablet Take 1,000 Units by mouth daily.  . Denosumab (XGEVA [place]) Inject into the skin. Every 28 days  . dexamethasone (DECADRON) 4 MG tablet Take 10 tablets (40 mg) on days 1, 8, and 15 of chemo. Repeat every 21 days.  Marland Kitchen glipiZIDE (GLUCOTROL) 5 MG tablet   . lenalidomide (REVLIMID) 20 MG capsule Take one capsule daily on days 1-14 every 21 days.  Marland Kitchen lisinopril-hydrochlorothiazide (PRINZIDE,ZESTORETIC) 20-25 MG tablet   . metFORMIN (GLUCOPHAGE) 1000 MG tablet Take 1,000 mg by mouth 2 times daily at 12 noon and 4 pm.    . ondansetron (ZOFRAN) 8 MG tablet Take 1 tablet (8 mg total) by mouth 2 (two) times daily as needed (Nausea or vomiting).  . potassium chloride SA (K-DUR,KLOR-CON) 20 MEQ tablet Take 1 tablet (20 mEq total) by mouth daily.  . prochlorperazine (COMPAZINE) 10 MG tablet Take 1 tablet (10 mg total) by mouth every 6 (six) hours as needed (Nausea or vomiting).   No facility-administered encounter medications on file as of 09/02/2018.     ALLERGIES:  Allergies  Allergen Reactions  . Motrin [Ibuprofen] Rash     PHYSICAL EXAM:  ECOG Performance status: 1  Vitals:   09/02/18 1053  BP: (!) 112/44  Pulse: (!) 53  Resp: 18  Temp: 98.2 F (36.8 C)  SpO2: 100%   Filed Weights   09/02/18 1053  Weight: 163 lb 9.6 oz (74.2 kg)    Physical Exam  Constitutional: She is oriented to person, place, and time. She appears well-developed and well-nourished.  Cardiovascular: Normal rate, regular rhythm and normal heart sounds.  Pulmonary/Chest: Effort normal and breath sounds normal.  Musculoskeletal: Normal range of motion.  Neurological: She is alert and oriented to person, place, and time.  Skin: Skin is warm and dry.  Psychiatric: She has a normal mood  and affect. Her behavior is normal. Judgment and thought content normal.     LABORATORY DATA:  I have reviewed the labs as listed.  CBC    Component Value Date/Time   WBC 4.3 09/02/2018 0950   RBC 2.64 (L) 09/02/2018 0950   HGB 8.9 (L) 09/02/2018 0950   HCT 27.2 (L) 09/02/2018 0950   PLT 187 09/02/2018 0950   MCV 103.0 (H) 09/02/2018 0950   MCH 33.7 09/02/2018 0950   MCHC 32.7 09/02/2018 0950   RDW 16.3 (H) 09/02/2018 0950   LYMPHSABS 1.0 09/02/2018 0950  MONOABS 0.3 09/02/2018 0950   EOSABS 0.1 09/02/2018 0950   BASOSABS 0.0 09/02/2018 0950   CMP Latest Ref Rng & Units 09/02/2018 08/19/2018 08/12/2018  Glucose 70 - 99 mg/dL 62(L) 67(L) 61(L)  BUN 8 - 23 mg/dL '15 17 12  ' Creatinine 0.44 - 1.00 mg/dL 0.93 0.99 0.90  Sodium 135 - 145 mmol/L 138 139 140  Potassium 3.5 - 5.1 mmol/L 3.2(L) 3.5 3.4(L)  Chloride 98 - 111 mmol/L 107 109 110  CO2 22 - 32 mmol/L 21(L) 21(L) 22  Calcium 8.9 - 10.3 mg/dL 8.7(L) 7.3(L) 7.6(L)  Total Protein 6.5 - 8.1 g/dL 7.0 6.9 6.7  Total Bilirubin 0.3 - 1.2 mg/dL 0.8 1.1 1.2  Alkaline Phos 38 - 126 U/L 53 58 51  AST 15 - 41 U/L 9(L) 12(L) 14(L)  ALT 0 - 44 U/L '6 9 9         ' ASSESSMENT & PLAN:   Multiple myeloma not having achieved remission ([facility]) 1.  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: -Bone marrow biopsy on 01/03/2018 with 60% plasma cells, FISH panel with no abnormalities, chromosome analysis showing hyperdiploidy with gains of chromosomes 2, 3, 4, 7, 10, 15, beta-2 microglobulin 3.2, LDH normal, 0.9 g/dL of M spike at diagnosis, free light chain ratio of 114, Kappa Light chain of 1796 -Skeletal survey on 02/16/2018 showing subtle patchy areas of osteopenia of the thoracolumbar spine and possibly distal right clavicle which may reflect subtle changes of multiple myeloma - RVD (Velcade on days 1, 8 and 15, Revlimid 25 on days 1-21, dexamethasone 10 pills on day of Velcade every 28 days) cycle 1 started on 01/26/2018, cycle  4 on 04/13/2018, she is asynchronous with Revlimid and Velcade.  We have held her Revlimid once when [place] dropped to 600 during cycle 4. - Her Revlimid was dose reduced to 20 mg because of cytopenias.  She is tolerating it very well.  She will finish her Revlimid on 09/08/2018. - We discussed myeloma test results from 08/19/2018.  SPEP has become negative.  Free light chain ratio has gone up to 2.1 although kappa light chains have decreased from 46-35. -We will continue the same regimen at this time.  She has not developed any side effects from it.  We will see her back in 6 weeks for follow-up.  2.  Bone strengthening: She will continue calcium and vitamin D twice daily.  She will continue denosumab injections.  3.  Hypokalemia: Today her potassium is within normal limits.  She will continue potassium 20 mEq twice a day.      Orders placed this encounter:  Orders Placed This Encounter  Procedures  . Protein electrophoresis, serum  . Immunofixation electrophoresis  . Kappa/lambda light chains  . CBC with Differential/Platelet  . Comprehensive metabolic panel  . Lactate dehydrogenase      Derek Jack, MD [place] [phone]

## 2018-09-02 NOTE — Assessment & Plan Note (Signed)
1.  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: -Bone marrow biopsy on 01/03/2018 with 60% plasma cells, FISH panel with no abnormalities, chromosome analysis showing hyperdiploidy with gains of chromosomes 2, 3, 4, 7, 10, 15, beta-2 microglobulin 3.2, LDH normal, 0.9 g/dL of M spike at diagnosis, free light chain ratio of 114, Kappa Light chain of 1796 -Skeletal survey on 02/16/2018 showing subtle patchy areas of osteopenia of the thoracolumbar spine and possibly distal right clavicle which may reflect subtle changes of multiple myeloma - RVD (Velcade on days 1, 8 and 15, Revlimid 25 on days 1-21, dexamethasone 10 pills on day of Velcade every 28 days) cycle 1 started on 01/26/2018, cycle 4 on 04/13/2018, she is asynchronous with Revlimid and Velcade.  We have held her Revlimid once when [place] dropped to 600 during cycle 4. - Her Revlimid was dose reduced to 20 mg because of cytopenias.  She is tolerating it very well.  She will finish her Revlimid on 09/08/2018. - We discussed myeloma test results from 08/19/2018.  SPEP has become negative.  Free light chain ratio has gone up to 2.1 although kappa light chains have decreased from 46-35. -We will continue the same regimen at this time.  She has not developed any side effects from it.  We will see her back in 6 weeks for follow-up.  2.  Bone strengthening: She will continue calcium and vitamin D twice daily.  She will continue denosumab injections.  3.  Hypokalemia: Today her potassium is within normal limits.  She will continue potassium 20 mEq twice a day.

## 2018-09-02 NOTE — Patient Instructions (Signed)
[facility] Discharge Instructions for Patients Receiving Chemotherapy  Today you received the following chemotherapy agents velcade, b12, and xgeva.    If you develop nausea and vomiting that is not controlled by your nausea medication, call the clinic.   BELOW ARE SYMPTOMS THAT SHOULD BE REPORTED IMMEDIATELY:  *FEVER GREATER THAN 100.5 F  *CHILLS WITH OR WITHOUT FEVER  NAUSEA AND VOMITING THAT IS NOT CONTROLLED WITH YOUR NAUSEA MEDICATION  *UNUSUAL SHORTNESS OF BREATH  *UNUSUAL BRUISING OR BLEEDING  TENDERNESS IN MOUTH AND THROAT WITH OR WITHOUT PRESENCE OF ULCERS  *URINARY PROBLEMS  *BOWEL PROBLEMS  UNUSUAL RASH Items with * indicate a potential emergency and should be followed up as soon as possible.  Feel free to call the clinic should you have any questions or concerns. The clinic phone number is [phone].  Please show the [facility] at check-in to the Emergency Department and triage nurse.

## 2018-09-05 ENCOUNTER — Other Ambulatory Visit (HOSPITAL_COMMUNITY): Payer: Self-pay | Admitting: *Deleted

## 2018-09-05 DIAGNOSIS — C9000 Multiple myeloma not having achieved remission: Secondary | ICD-10-CM

## 2018-09-05 MED ORDER — LENALIDOMIDE 20 MG PO CAPS: ORAL_CAPSULE | ORAL | 0 refills | Status: DC

## 2018-09-05 NOTE — Telephone Encounter (Signed)
Chart reviewed, per Dr. Tomie China last office note, Revlimid refilled.

## 2018-09-09 ENCOUNTER — Inpatient Hospital Stay (HOSPITAL_COMMUNITY): Payer: Medicare Other

## 2018-09-09 ENCOUNTER — Encounter (HOSPITAL_COMMUNITY): Payer: Self-pay

## 2018-09-09 VITALS — BP 112/54 | HR 56 | Temp 98.0°F | Resp 18

## 2018-09-09 DIAGNOSIS — C9000 Multiple myeloma not having achieved remission: Secondary | ICD-10-CM

## 2018-09-09 DIAGNOSIS — Z5111 Encounter for antineoplastic chemotherapy: Secondary | ICD-10-CM

## 2018-09-09 DIAGNOSIS — Z5112 Encounter for antineoplastic immunotherapy: Secondary | ICD-10-CM | POA: Diagnosis not present

## 2018-09-09 LAB — CBC WITH DIFFERENTIAL/PLATELET
BASOS ABS: 0 10*3/uL (ref 0.0–0.1)
Basophils Relative: 0 %
EOS PCT: 4 %
Eosinophils Absolute: 0.1 10*3/uL (ref 0.0–0.7)
HCT: 26.2 % — ABNORMAL LOW (ref 36.0–46.0)
HEMOGLOBIN: 8.8 g/dL — AB (ref 12.0–15.0)
LYMPHS PCT: 24 %
Lymphs Abs: 0.8 10*3/uL (ref 0.7–4.0)
MCH: 34.1 pg — ABNORMAL HIGH (ref 26.0–34.0)
MCHC: 33.6 g/dL (ref 30.0–36.0)
MCV: 101.6 fL — AB (ref 78.0–100.0)
Monocytes Absolute: 0.6 10*3/uL (ref 0.1–1.0)
Monocytes Relative: 19 %
NEUTROS ABS: 1.8 10*3/uL (ref 1.7–7.7)
NEUTROS PCT: 53 %
PLATELETS: 149 10*3/uL — AB (ref 150–400)
RBC: 2.58 MIL/uL — AB (ref 3.87–5.11)
RDW: 16.3 % — ABNORMAL HIGH (ref 11.5–15.5)
WBC: 3.3 10*3/uL — AB (ref 4.0–10.5)

## 2018-09-09 LAB — COMPREHENSIVE METABOLIC PANEL
ALT: 9 U/L (ref 0–44)
ANION GAP: 9 (ref 5–15)
AST: 10 U/L — ABNORMAL LOW (ref 15–41)
Albumin: 3.8 g/dL (ref 3.5–5.0)
Alkaline Phosphatase: 61 U/L (ref 38–126)
BUN: 21 mg/dL (ref 8–23)
CHLORIDE: 106 mmol/L (ref 98–111)
CO2: 26 mmol/L (ref 22–32)
CREATININE: 1.11 mg/dL — AB (ref 0.44–1.00)
Calcium: 7.7 mg/dL — ABNORMAL LOW (ref 8.9–10.3)
GFR, EST AFRICAN AMERICAN: 54 mL/min — AB (ref >60)
GFR, EST NON AFRICAN AMERICAN: 46 mL/min — AB (ref >60)
Glucose, Bld: 77 mg/dL (ref 70–99)
POTASSIUM: 2.3 mmol/L — AB (ref 3.5–5.1)
Sodium: 141 mmol/L (ref 135–145)
Total Bilirubin: 1.3 mg/dL — ABNORMAL HIGH (ref 0.3–1.2)
Total Protein: 7 g/dL (ref 6.5–8.1)

## 2018-09-09 MED ORDER — BORTEZOMIB CHEMO SQ INJECTION 3.5 MG (2.5MG/ML)
1.3000 mg/m2 | Freq: Once | INTRAMUSCULAR | Status: AC
  Administered 2018-09-09: 2.5 mg via SUBCUTANEOUS
  Filled 2018-09-09: qty 1

## 2018-09-09 MED ORDER — PROCHLORPERAZINE MALEATE 10 MG PO TABS
10.0000 mg | ORAL_TABLET | Freq: Once | ORAL | Status: AC
  Administered 2018-09-09: 10 mg via ORAL
  Filled 2018-09-09: qty 1

## 2018-09-09 MED ORDER — SODIUM CHLORIDE 0.9 % IV SOLN
INTRAVENOUS | Status: DC
  Administered 2018-09-09: 14:00:00 via INTRAVENOUS

## 2018-09-09 MED ORDER — POTASSIUM CHLORIDE 10 MEQ/100ML IV SOLN
10.0000 meq | INTRAVENOUS | Status: AC
  Administered 2018-09-09 (×2): 10 meq via INTRAVENOUS
  Filled 2018-09-09 (×2): qty 100

## 2018-09-09 MED ORDER — ACYCLOVIR 400 MG PO TABS: 400.0000 mg | ORAL_TABLET | Freq: Two times a day (BID) | ORAL | 3 refills | Status: DC

## 2018-09-09 NOTE — Progress Notes (Signed)
1340 Labs reviewed with Dr. Walden Field  And pt approved for Velcade injection today as well as Potassium 20 meq IV now for K+ of 2.3 per MD orders. Pt has been taking her Potassium 20 meq PO daily as prescribed and will continue to do so.                                                                       Rafeef Lau Broz tolerated Velcade injection and Potassium infusions well. Pt did complain of her right arm burning a little from the IV potassium, peripheral IV site checked with positive blood return noted, so rate was decreased and pt was able to tolerate it better. VSS upon discharge. Pt discharged self ambulatory in satisfactory condition accompanied by a family member

## 2018-09-09 NOTE — Patient Instructions (Signed)
[facility] Discharge Instructions for Patients Receiving Chemotherapy   Beginning January 23rd 2017 lab work for the [facility] will be done in the  Main lab at [facility] on 1st floor. If you have a lab appointment with the [facility] please come in thru the  Main Entrance and check in at the main information desk   Today you received the following chemotherapy agents Velcade injection as well as Potassium infusions. Follow-up as scheduled. Call clinic for any questions or concerns  To help prevent nausea and vomiting after your treatment, we encourage you to take your nausea medication   If you develop nausea and vomiting, or diarrhea that is not controlled by your medication, call the clinic.  The clinic phone number is [phone]. Office hours are Monday-Friday 8:30am-5:00pm.  BELOW ARE SYMPTOMS THAT SHOULD BE REPORTED IMMEDIATELY:  *FEVER GREATER THAN 101.0 F  *CHILLS WITH OR WITHOUT FEVER  NAUSEA AND VOMITING THAT IS NOT CONTROLLED WITH YOUR NAUSEA MEDICATION  *UNUSUAL SHORTNESS OF BREATH  *UNUSUAL BRUISING OR BLEEDING  TENDERNESS IN MOUTH AND THROAT WITH OR WITHOUT PRESENCE OF ULCERS  *URINARY PROBLEMS  *BOWEL PROBLEMS  UNUSUAL RASH Items with * indicate a potential emergency and should be followed up as soon as possible. If you have an emergency after office hours please contact your primary care physician or go to the nearest emergency department.  Please call the clinic during office hours if you have any questions or concerns.   You may also contact the Patient Navigator at [phone] should you have any questions or need assistance in obtaining follow up care.      Resources For Cancer Patients and their Caregivers ? American Cancer Society: Can assist with transportation, wigs, general needs, runs Look Good Feel Better.        9177885060 ? Cancer Care: Provides financial assistance, online support groups,  medication/co-pay assistance.  1-800-813-HOPE (313) 052-5721) ? [facility] Assists [place] cancer patients and their families through emotional , educational and financial support.  463-489-0145 ? [place] DSS Where to apply for food stamps, Medicaid and utility assistance. [phone] ? [facility]: Transportation to medical appointments. (938)241-5732 ? Social Security Administration: May apply for disability if have a Stage IV cancer. 780-214-6637 [phone] ? [employer], Disability and Transit Services: Assists with nutrition, care and transit needs. [phone]

## 2018-09-16 ENCOUNTER — Inpatient Hospital Stay (HOSPITAL_COMMUNITY): Payer: Medicare Other

## 2018-09-16 ENCOUNTER — Encounter (HOSPITAL_COMMUNITY): Payer: Self-pay

## 2018-09-16 VITALS — BP 106/42 | HR 59 | Temp 97.8°F | Resp 18

## 2018-09-16 DIAGNOSIS — Z5111 Encounter for antineoplastic chemotherapy: Secondary | ICD-10-CM

## 2018-09-16 DIAGNOSIS — Z5112 Encounter for antineoplastic immunotherapy: Secondary | ICD-10-CM | POA: Diagnosis not present

## 2018-09-16 DIAGNOSIS — C9000 Multiple myeloma not having achieved remission: Secondary | ICD-10-CM

## 2018-09-16 LAB — COMPREHENSIVE METABOLIC PANEL
ALBUMIN: 3.7 g/dL (ref 3.5–5.0)
ALT: 10 U/L (ref 0–44)
AST: 14 U/L — AB (ref 15–41)
Alkaline Phosphatase: 59 U/L (ref 38–126)
Anion gap: 9 (ref 5–15)
BUN: 21 mg/dL (ref 8–23)
CHLORIDE: 104 mmol/L (ref 98–111)
CO2: 25 mmol/L (ref 22–32)
CREATININE: 1.22 mg/dL — AB (ref 0.44–1.00)
Calcium: 7.1 mg/dL — ABNORMAL LOW (ref 8.9–10.3)
GFR calc Af Amer: 48 mL/min — ABNORMAL LOW (ref >60)
GFR calc non Af Amer: 41 mL/min — ABNORMAL LOW (ref >60)
GLUCOSE: 59 mg/dL — AB (ref 70–99)
POTASSIUM: 2.7 mmol/L — AB (ref 3.5–5.1)
SODIUM: 138 mmol/L (ref 135–145)
Total Bilirubin: 1 mg/dL (ref 0.3–1.2)
Total Protein: 6.6 g/dL (ref 6.5–8.1)

## 2018-09-16 LAB — CBC WITH DIFFERENTIAL/PLATELET
BASOS ABS: 0 10*3/uL (ref 0.0–0.1)
Basophils Relative: 0 %
EOS ABS: 0.2 10*3/uL (ref 0.0–0.7)
EOS PCT: 5 %
HCT: 26.3 % — ABNORMAL LOW (ref 36.0–46.0)
Hemoglobin: 8.8 g/dL — ABNORMAL LOW (ref 12.0–15.0)
Lymphocytes Relative: 26 %
Lymphs Abs: 0.8 10*3/uL (ref 0.7–4.0)
MCH: 33.8 pg (ref 26.0–34.0)
MCHC: 33.5 g/dL (ref 30.0–36.0)
MCV: 101.2 fL — ABNORMAL HIGH (ref 78.0–100.0)
MONO ABS: 0.7 10*3/uL (ref 0.1–1.0)
Monocytes Relative: 21 %
NEUTROS ABS: 1.5 10*3/uL — AB (ref 1.7–7.7)
Neutrophils Relative %: 48 %
PLATELETS: 151 10*3/uL (ref 150–400)
RBC: 2.6 MIL/uL — ABNORMAL LOW (ref 3.87–5.11)
RDW: 16.4 % — AB (ref 11.5–15.5)
WBC: 3.2 10*3/uL — ABNORMAL LOW (ref 4.0–10.5)

## 2018-09-16 MED ORDER — PROCHLORPERAZINE MALEATE 10 MG PO TABS
10.0000 mg | ORAL_TABLET | Freq: Once | ORAL | Status: AC
  Administered 2018-09-16: 10 mg via ORAL
  Filled 2018-09-16: qty 1

## 2018-09-16 MED ORDER — SODIUM CHLORIDE 0.9 % IV SOLN
INTRAVENOUS | Status: DC
  Administered 2018-09-16: 13:00:00 via INTRAVENOUS

## 2018-09-16 MED ORDER — BORTEZOMIB CHEMO SQ INJECTION 3.5 MG (2.5MG/ML)
1.3000 mg/m2 | Freq: Once | INTRAMUSCULAR | Status: AC
  Administered 2018-09-16: 2.5 mg via SUBCUTANEOUS
  Filled 2018-09-16: qty 1

## 2018-09-16 MED ORDER — POTASSIUM CHLORIDE 10 MEQ/100ML IV SOLN
10.0000 meq | INTRAVENOUS | Status: AC
  Administered 2018-09-16 (×2): 10 meq via INTRAVENOUS
  Filled 2018-09-16 (×2): qty 100

## 2018-09-16 NOTE — Patient Instructions (Signed)
[facility] Discharge Instructions for Patients Receiving Chemotherapy   Beginning January 23rd 2017 lab work for the [facility] will be done in the  Main lab at [facility] on 1st floor. If you have a lab appointment with the [facility] please come in thru the  Main Entrance and check in at the main information desk   Today you received the following chemotherapy agents Velcade injection as well as Potassium infusions. Follow-up as scheduled. Call clinic for any questions or concerns  To help prevent nausea and vomiting after your treatment, we encourage you to take your nausea medication   If you develop nausea and vomiting, or diarrhea that is not controlled by your medication, call the clinic.  The clinic phone number is [phone]. Office hours are Monday-Friday 8:30am-5:00pm.  BELOW ARE SYMPTOMS THAT SHOULD BE REPORTED IMMEDIATELY:  *FEVER GREATER THAN 101.0 F  *CHILLS WITH OR WITHOUT FEVER  NAUSEA AND VOMITING THAT IS NOT CONTROLLED WITH YOUR NAUSEA MEDICATION  *UNUSUAL SHORTNESS OF BREATH  *UNUSUAL BRUISING OR BLEEDING  TENDERNESS IN MOUTH AND THROAT WITH OR WITHOUT PRESENCE OF ULCERS  *URINARY PROBLEMS  *BOWEL PROBLEMS  UNUSUAL RASH Items with * indicate a potential emergency and should be followed up as soon as possible. If you have an emergency after office hours please contact your primary care physician or go to the nearest emergency department.  Please call the clinic during office hours if you have any questions or concerns.   You may also contact the Patient Navigator at [phone] should you have any questions or need assistance in obtaining follow up care.      Resources For Cancer Patients and their Caregivers ? American Cancer Society: Can assist with transportation, wigs, general needs, runs Look Good Feel Better.        (913)256-4606 ? Cancer Care: Provides financial assistance, online support groups,  medication/co-pay assistance.  1-800-813-HOPE (820)407-5635) ? [facility] Assists [place] cancer patients and their families through emotional , educational and financial support.  475-301-1671 ? [place] DSS Where to apply for food stamps, Medicaid and utility assistance. 915 265 6614 ? [employer]: Transportation to medical appointments. 432-377-9365 ? Social Security Administration: May apply for disability if have a Stage IV cancer. [phone] ? [employer], Disability and Transit Services: Assists with nutrition, care and transit needs. 519-264-7975

## 2018-09-16 NOTE — Progress Notes (Signed)
1230 Lab reviewed with Dr. Walden Field and pt approved for Velcade injection today as well as Potassium 20 meq IV for K+ of 2.7 per MD             Brittany Archer tolerated Velcade injection and Potassium infusions well without complaints or incident. Peripheral IV checked with positive blood return noted prior to and after infusions. VSS upon discharge. Pt discharged self ambulatory in satisfactory condition accompanied by family member

## 2018-09-30 ENCOUNTER — Inpatient Hospital Stay (HOSPITAL_COMMUNITY): Payer: Medicare Other

## 2018-09-30 ENCOUNTER — Inpatient Hospital Stay (HOSPITAL_COMMUNITY): Payer: Medicare Other | Attending: Hematology

## 2018-09-30 ENCOUNTER — Encounter (HOSPITAL_COMMUNITY): Payer: Self-pay

## 2018-09-30 ENCOUNTER — Other Ambulatory Visit: Payer: Self-pay

## 2018-09-30 VITALS — BP 119/44 | HR 59 | Temp 98.2°F | Resp 18 | Wt 155.0 lb

## 2018-09-30 DIAGNOSIS — E876 Hypokalemia: Secondary | ICD-10-CM | POA: Insufficient documentation

## 2018-09-30 DIAGNOSIS — C9000 Multiple myeloma not having achieved remission: Secondary | ICD-10-CM

## 2018-09-30 DIAGNOSIS — Z79899 Other long term (current) drug therapy: Secondary | ICD-10-CM | POA: Diagnosis not present

## 2018-09-30 DIAGNOSIS — E538 Deficiency of other specified B group vitamins: Secondary | ICD-10-CM

## 2018-09-30 DIAGNOSIS — Z5112 Encounter for antineoplastic immunotherapy: Secondary | ICD-10-CM | POA: Diagnosis not present

## 2018-09-30 DIAGNOSIS — Z5111 Encounter for antineoplastic chemotherapy: Secondary | ICD-10-CM

## 2018-09-30 DIAGNOSIS — D649 Anemia, unspecified: Secondary | ICD-10-CM | POA: Diagnosis not present

## 2018-09-30 LAB — COMPREHENSIVE METABOLIC PANEL
ALT: 9 U/L (ref 0–44)
AST: 13 U/L — AB (ref 15–41)
Albumin: 3.8 g/dL (ref 3.5–5.0)
Alkaline Phosphatase: 57 U/L (ref 38–126)
Anion gap: 13 (ref 5–15)
BUN: 23 mg/dL (ref 8–23)
CHLORIDE: 100 mmol/L (ref 98–111)
CO2: 24 mmol/L (ref 22–32)
CREATININE: 1.23 mg/dL — AB (ref 0.44–1.00)
Calcium: 10.1 mg/dL (ref 8.9–10.3)
GFR, EST AFRICAN AMERICAN: 47 mL/min — AB (ref >60)
GFR, EST NON AFRICAN AMERICAN: 41 mL/min — AB (ref >60)
Glucose, Bld: 56 mg/dL — ABNORMAL LOW (ref 70–99)
POTASSIUM: 3.2 mmol/L — AB (ref 3.5–5.1)
SODIUM: 137 mmol/L (ref 135–145)
Total Bilirubin: 0.8 mg/dL (ref 0.3–1.2)
Total Protein: 7.4 g/dL (ref 6.5–8.1)

## 2018-09-30 LAB — CBC WITH DIFFERENTIAL/PLATELET
BASOS ABS: 0 10*3/uL (ref 0.0–0.1)
Basophils Relative: 0 %
Eosinophils Absolute: 0.2 10*3/uL (ref 0.0–0.7)
Eosinophils Relative: 4 %
HCT: 26.4 % — ABNORMAL LOW (ref 36.0–46.0)
Hemoglobin: 8.6 g/dL — ABNORMAL LOW (ref 12.0–15.0)
LYMPHS PCT: 19 %
Lymphs Abs: 1.1 10*3/uL (ref 0.7–4.0)
MCH: 34 pg (ref 26.0–34.0)
MCHC: 32.6 g/dL (ref 30.0–36.0)
MCV: 104.3 fL — AB (ref 78.0–100.0)
MONO ABS: 0.3 10*3/uL (ref 0.1–1.0)
MONOS PCT: 4 %
NEUTROS ABS: 4.3 10*3/uL (ref 1.7–7.7)
Neutrophils Relative %: 73 %
PLATELETS: 161 10*3/uL (ref 150–400)
RBC: 2.53 MIL/uL — ABNORMAL LOW (ref 3.87–5.11)
RDW: 15.9 % — AB (ref 11.5–15.5)
WBC: 5.9 10*3/uL (ref 4.0–10.5)

## 2018-09-30 MED ORDER — BORTEZOMIB CHEMO SQ INJECTION 3.5 MG (2.5MG/ML)
1.3000 mg/m2 | Freq: Once | INTRAMUSCULAR | Status: AC
  Administered 2018-09-30: 2.5 mg via SUBCUTANEOUS
  Filled 2018-09-30: qty 1

## 2018-09-30 MED ORDER — POTASSIUM CHLORIDE CRYS ER 20 MEQ PO TBCR: 20.0000 meq | EXTENDED_RELEASE_TABLET | Freq: Two times a day (BID) | ORAL | 3 refills | Status: DC

## 2018-09-30 MED ORDER — PROCHLORPERAZINE MALEATE 10 MG PO TABS
10.0000 mg | ORAL_TABLET | Freq: Once | ORAL | Status: AC
  Administered 2018-09-30: 10 mg via ORAL
  Filled 2018-09-30: qty 1

## 2018-09-30 MED ORDER — CYANOCOBALAMIN 1000 MCG/ML IJ SOLN
1000.0000 ug | Freq: Once | INTRAMUSCULAR | Status: AC
  Administered 2018-09-30: 1000 ug via INTRAMUSCULAR
  Filled 2018-09-30: qty 1

## 2018-09-30 MED ORDER — DENOSUMAB 120 MG/1.7ML ~~LOC~~ SOLN
120.0000 mg | Freq: Once | SUBCUTANEOUS | Status: AC
  Administered 2018-09-30: 120 mg via SUBCUTANEOUS
  Filled 2018-09-30: qty 1.7

## 2018-09-30 NOTE — Progress Notes (Signed)
Brittany Archer presents today for injection per the provider's orders.  Velcade, B12, and Xgeva administration without incident; see MAR for injection details.  Patient tolerated procedure well and without incident.  No questions or complaints noted at this time.  Discharged ambulatory in c/o family.

## 2018-10-03 ENCOUNTER — Other Ambulatory Visit (HOSPITAL_COMMUNITY): Payer: Self-pay | Admitting: *Deleted

## 2018-10-03 DIAGNOSIS — C9000 Multiple myeloma not having achieved remission: Secondary | ICD-10-CM

## 2018-10-03 MED ORDER — LENALIDOMIDE 20 MG PO CAPS: ORAL_CAPSULE | ORAL | 0 refills | Status: DC

## 2018-10-03 NOTE — Telephone Encounter (Signed)
Chart reviewed, revlimid refilled. 

## 2018-10-07 ENCOUNTER — Inpatient Hospital Stay (HOSPITAL_COMMUNITY): Payer: Medicare Other

## 2018-10-07 ENCOUNTER — Encounter (HOSPITAL_COMMUNITY): Payer: Self-pay

## 2018-10-07 ENCOUNTER — Other Ambulatory Visit: Payer: Self-pay

## 2018-10-07 VITALS — BP 100/48 | HR 60 | Temp 97.3°F | Resp 18 | Wt 152.6 lb

## 2018-10-07 DIAGNOSIS — C9000 Multiple myeloma not having achieved remission: Secondary | ICD-10-CM

## 2018-10-07 DIAGNOSIS — Z5112 Encounter for antineoplastic immunotherapy: Secondary | ICD-10-CM | POA: Diagnosis not present

## 2018-10-07 LAB — CBC WITH DIFFERENTIAL/PLATELET
ABS IMMATURE GRANULOCYTES: 0.02 10*3/uL (ref 0.00–0.07)
BASOS ABS: 0 10*3/uL (ref 0.0–0.1)
Basophils Relative: 0 %
EOS PCT: 7 %
Eosinophils Absolute: 0.3 10*3/uL (ref 0.0–0.5)
HEMATOCRIT: 26.5 % — AB (ref 36.0–46.0)
HEMOGLOBIN: 8.6 g/dL — AB (ref 12.0–15.0)
Immature Granulocytes: 0 %
LYMPHS ABS: 0.8 10*3/uL (ref 0.7–4.0)
Lymphocytes Relative: 15 %
MCH: 33.7 pg (ref 26.0–34.0)
MCHC: 32.5 g/dL (ref 30.0–36.0)
MCV: 103.9 fL — ABNORMAL HIGH (ref 80.0–100.0)
MONO ABS: 0.6 10*3/uL (ref 0.1–1.0)
Monocytes Relative: 12 %
NEUTROS ABS: 3.5 10*3/uL (ref 1.7–7.7)
Neutrophils Relative %: 66 %
Platelets: 146 10*3/uL — ABNORMAL LOW (ref 150–400)
RBC: 2.55 MIL/uL — ABNORMAL LOW (ref 3.87–5.11)
RDW: 15.6 % — ABNORMAL HIGH (ref 11.5–15.5)
WBC: 5.2 10*3/uL (ref 4.0–10.5)
nRBC: 0 % (ref 0.0–0.2)

## 2018-10-07 LAB — COMPREHENSIVE METABOLIC PANEL
ALBUMIN: 3.8 g/dL (ref 3.5–5.0)
ALK PHOS: 57 U/L (ref 38–126)
ALT: 7 U/L (ref 0–44)
ANION GAP: 11 (ref 5–15)
AST: 12 U/L — ABNORMAL LOW (ref 15–41)
BILIRUBIN TOTAL: 0.8 mg/dL (ref 0.3–1.2)
BUN: 34 mg/dL — ABNORMAL HIGH (ref 8–23)
CO2: 18 mmol/L — AB (ref 22–32)
CREATININE: 1.21 mg/dL — AB (ref 0.44–1.00)
Calcium: 8.5 mg/dL — ABNORMAL LOW (ref 8.9–10.3)
Chloride: 105 mmol/L (ref 98–111)
GFR calc Af Amer: 48 mL/min — ABNORMAL LOW (ref >60)
GFR calc non Af Amer: 42 mL/min — ABNORMAL LOW (ref >60)
GLUCOSE: 113 mg/dL — AB (ref 70–99)
Potassium: 3.1 mmol/L — ABNORMAL LOW (ref 3.5–5.1)
Sodium: 134 mmol/L — ABNORMAL LOW (ref 135–145)
TOTAL PROTEIN: 7.4 g/dL (ref 6.5–8.1)

## 2018-10-07 LAB — LACTATE DEHYDROGENASE: LDH: 106 U/L (ref 98–192)

## 2018-10-07 MED ORDER — BORTEZOMIB CHEMO SQ INJECTION 3.5 MG (2.5MG/ML)
1.3000 mg/m2 | Freq: Once | INTRAMUSCULAR | Status: AC
  Administered 2018-10-07: 2.5 mg via SUBCUTANEOUS
  Filled 2018-10-07: qty 1

## 2018-10-07 MED ORDER — POTASSIUM CHLORIDE CRYS ER 20 MEQ PO TBCR
40.0000 meq | EXTENDED_RELEASE_TABLET | Freq: Once | ORAL | Status: AC
  Administered 2018-10-07: 40 meq via ORAL
  Filled 2018-10-07: qty 2

## 2018-10-07 MED ORDER — PROCHLORPERAZINE MALEATE 10 MG PO TABS
10.0000 mg | ORAL_TABLET | Freq: Once | ORAL | Status: AC
  Administered 2018-10-07: 10 mg via ORAL
  Filled 2018-10-07: qty 1

## 2018-10-07 NOTE — Progress Notes (Signed)
Brittany Archer presents today for injection per the provider's orders.  Velcade administration without incident; see MAR for injection details.  Patient tolerated procedure well and without incident.  No questions or complaints noted at this time. Discharged ambulatory in c/o family.  

## 2018-10-10 LAB — KAPPA/LAMBDA LIGHT CHAINS
KAPPA, LAMDA LIGHT CHAIN RATIO: 1.45 (ref 0.26–1.65)
Kappa free light chain: 35 mg/L — ABNORMAL HIGH (ref 3.3–19.4)
LAMDA FREE LIGHT CHAINS: 24.2 mg/L (ref 5.7–26.3)

## 2018-10-10 LAB — IMMUNOFIXATION ELECTROPHORESIS
IGM (IMMUNOGLOBULIN M), SRM: 53 mg/dL (ref 26–217)
IgA: 296 mg/dL (ref 64–422)
IgG (Immunoglobin G), Serum: 776 mg/dL (ref 700–1600)
TOTAL PROTEIN ELP: 6.6 g/dL (ref 6.0–8.5)

## 2018-10-10 LAB — PROTEIN ELECTROPHORESIS, SERUM
A/G Ratio: 1 (ref 0.7–1.7)
ALBUMIN ELP: 3.3 g/dL (ref 2.9–4.4)
Alpha-1-Globulin: 0.2 g/dL (ref 0.0–0.4)
Alpha-2-Globulin: 1.2 g/dL — ABNORMAL HIGH (ref 0.4–1.0)
BETA GLOBULIN: 1.1 g/dL (ref 0.7–1.3)
GAMMA GLOBULIN: 0.7 g/dL (ref 0.4–1.8)
Globulin, Total: 3.2 g/dL (ref 2.2–3.9)
M-SPIKE, %: 0.5 g/dL — AB
TOTAL PROTEIN ELP: 6.5 g/dL (ref 6.0–8.5)

## 2018-10-12 ENCOUNTER — Other Ambulatory Visit (HOSPITAL_COMMUNITY): Payer: Self-pay | Admitting: Nurse Practitioner

## 2018-10-14 ENCOUNTER — Inpatient Hospital Stay (HOSPITAL_BASED_OUTPATIENT_CLINIC_OR_DEPARTMENT_OTHER): Payer: Medicare Other | Admitting: Internal Medicine

## 2018-10-14 ENCOUNTER — Inpatient Hospital Stay (HOSPITAL_COMMUNITY): Payer: Medicare Other

## 2018-10-14 ENCOUNTER — Other Ambulatory Visit: Payer: Self-pay

## 2018-10-14 ENCOUNTER — Encounter (HOSPITAL_COMMUNITY): Payer: Self-pay | Admitting: Internal Medicine

## 2018-10-14 VITALS — BP 91/45 | HR 66 | Temp 98.1°F | Resp 18 | Wt 150.1 lb

## 2018-10-14 DIAGNOSIS — C9000 Multiple myeloma not having achieved remission: Secondary | ICD-10-CM

## 2018-10-14 DIAGNOSIS — Z5112 Encounter for antineoplastic immunotherapy: Secondary | ICD-10-CM | POA: Diagnosis not present

## 2018-10-14 DIAGNOSIS — E876 Hypokalemia: Secondary | ICD-10-CM

## 2018-10-14 DIAGNOSIS — D649 Anemia, unspecified: Secondary | ICD-10-CM | POA: Diagnosis not present

## 2018-10-14 DIAGNOSIS — Z5111 Encounter for antineoplastic chemotherapy: Secondary | ICD-10-CM

## 2018-10-14 LAB — COMPREHENSIVE METABOLIC PANEL
ALT: 9 U/L (ref 0–44)
ANION GAP: 11 (ref 5–15)
AST: 13 U/L — ABNORMAL LOW (ref 15–41)
Albumin: 3.9 g/dL (ref 3.5–5.0)
Alkaline Phosphatase: 55 U/L (ref 38–126)
BILIRUBIN TOTAL: 0.5 mg/dL (ref 0.3–1.2)
BUN: 69 mg/dL — ABNORMAL HIGH (ref 8–23)
CHLORIDE: 110 mmol/L (ref 98–111)
CO2: 16 mmol/L — ABNORMAL LOW (ref 22–32)
Calcium: 8.1 mg/dL — ABNORMAL LOW (ref 8.9–10.3)
Creatinine, Ser: 1.78 mg/dL — ABNORMAL HIGH (ref 0.44–1.00)
GFR calc Af Amer: 30 mL/min — ABNORMAL LOW (ref >60)
GFR calc non Af Amer: 26 mL/min — ABNORMAL LOW (ref >60)
GLUCOSE: 89 mg/dL (ref 70–99)
POTASSIUM: 3.1 mmol/L — AB (ref 3.5–5.1)
Sodium: 137 mmol/L (ref 135–145)
TOTAL PROTEIN: 7.3 g/dL (ref 6.5–8.1)

## 2018-10-14 LAB — CBC WITH DIFFERENTIAL/PLATELET
Abs Immature Granulocytes: 0.01 10*3/uL (ref 0.00–0.07)
BASOS ABS: 0 10*3/uL (ref 0.0–0.1)
BASOS PCT: 1 %
EOS ABS: 0.1 10*3/uL (ref 0.0–0.5)
EOS PCT: 3 %
HEMATOCRIT: 26.7 % — AB (ref 36.0–46.0)
Hemoglobin: 8.7 g/dL — ABNORMAL LOW (ref 12.0–15.0)
Immature Granulocytes: 0 %
LYMPHS ABS: 0.7 10*3/uL (ref 0.7–4.0)
Lymphocytes Relative: 21 %
MCH: 33.3 pg (ref 26.0–34.0)
MCHC: 32.6 g/dL (ref 30.0–36.0)
MCV: 102.3 fL — ABNORMAL HIGH (ref 80.0–100.0)
Monocytes Absolute: 0.4 10*3/uL (ref 0.1–1.0)
Monocytes Relative: 12 %
NEUTROS PCT: 63 %
Neutro Abs: 2.1 10*3/uL (ref 1.7–7.7)
PLATELETS: 141 10*3/uL — AB (ref 150–400)
RBC: 2.61 MIL/uL — AB (ref 3.87–5.11)
RDW: 16.3 % — AB (ref 11.5–15.5)
WBC: 3.4 10*3/uL — AB (ref 4.0–10.5)
nRBC: 0 % (ref 0.0–0.2)

## 2018-10-14 MED ORDER — PROCHLORPERAZINE MALEATE 10 MG PO TABS
ORAL_TABLET | ORAL | Status: AC
  Filled 2018-10-14: qty 1

## 2018-10-14 MED ORDER — BORTEZOMIB CHEMO SQ INJECTION 3.5 MG (2.5MG/ML)
1.3000 mg/m2 | Freq: Once | INTRAMUSCULAR | Status: AC
  Administered 2018-10-14: 2.5 mg via SUBCUTANEOUS
  Filled 2018-10-14: qty 1

## 2018-10-14 MED ORDER — PROCHLORPERAZINE MALEATE 10 MG PO TABS
10.0000 mg | ORAL_TABLET | Freq: Once | ORAL | Status: AC
  Administered 2018-10-14: 10 mg via ORAL

## 2018-10-14 NOTE — Progress Notes (Signed)
1100 Labs reviewed with and pt seen by Dr. Walden Field today and pt approved for Velcade injection per MD                                                                      Brittany Archer tolerated Velcade injection well without complaints or incident.Pt continues to take her Revlimid as prescribed without any issues Pt discharged self ambulatory in satisfactory condition accompanied by family member

## 2018-10-14 NOTE — Patient Instructions (Signed)
[facility] Discharge Instructions for Patients Receiving Chemotherapy   Beginning January 23rd 2017 lab work for the Cancer Center will be done in the  Main lab at [facility] on 1st floor. If you have a lab appointment with the Cancer Center please come in thru the  Main Entrance and check in at the main information desk   Today you received the following chemotherapy agents Velcade injection. Follow-up as scheduled. Call clinic for any questions or concerns  To help prevent nausea and vomiting after your treatment, we encourage you to take your nausea medication   If you develop nausea and vomiting, or diarrhea that is not controlled by your medication, call the clinic.  The clinic phone number is [phone]. Office hours are Monday-Friday 8:30am-5:00pm.  BELOW ARE SYMPTOMS THAT SHOULD BE REPORTED IMMEDIATELY:  *FEVER GREATER THAN 101.0 F  *CHILLS WITH OR WITHOUT FEVER  NAUSEA AND VOMITING THAT IS NOT CONTROLLED WITH YOUR NAUSEA MEDICATION  *UNUSUAL SHORTNESS OF BREATH  *UNUSUAL BRUISING OR BLEEDING  TENDERNESS IN MOUTH AND THROAT WITH OR WITHOUT PRESENCE OF ULCERS  *URINARY PROBLEMS  *BOWEL PROBLEMS  UNUSUAL RASH Items with * indicate a potential emergency and should be followed up as soon as possible. If you have an emergency after office hours please contact your primary care physician or go to the nearest emergency department.  Please call the clinic during office hours if you have any questions or concerns.   You may also contact the Patient Navigator at [phone] should you have any questions or need assistance in obtaining follow up care.      Resources For Cancer Patients and their Caregivers ? American Cancer Society: Can assist with transportation, wigs, general needs, runs Look Good Feel Better.        1-888-227-6333 ? Cancer Care: Provides financial assistance, online support groups, medication/co-pay assistance.   1-800-813-HOPE (4673) ? [facility] Assists [place] cancer patients and their families through emotional , educational and financial support.  336-427-4357 ? [place] DSS Where to apply for food stamps, Medicaid and utility assistance. 336-342-1394 ? [employer]: Transportation to medical appointments. 336-347-2287 ? Social Security Administration: May apply for disability if have a Stage IV cancer. [phone] 1-800-772-1213 ? [place] [employer], Disability and Transit Services: Assists with nutrition, care and transit needs. [phone]         

## 2018-10-14 NOTE — Progress Notes (Signed)
Diagnosis No diagnosis found.  Staging Cancer Staging No matching staging information was found for the patient.  Assessment and Plan:  Multiple myeloma not having achieved remission ([place]) 1.  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: -Bone marrow biopsy on 01/03/2018 with 60% plasma cells, FISH panel with no abnormalities, chromosome analysis showing hyperdiploidy with gains of chromosomes 2, 3, 4, 7, 10, 15, beta-2 microglobulin 3.2, LDH normal, 0.9 g/dL of M spike at diagnosis, free light chain ratio of 114, Kappa Light chain of 1796 -Skeletal survey on 02/16/2018 showing subtle patchy areas of osteopenia of the thoracolumbar spine and possibly distal right clavicle which may reflect subtle changes of multiple myeloma - RVD (Velcade on days 1, 8 and 15, Revlimid 25 on days 1-21, dexamethasone 10 pills on day of Velcade every 28 days) cycle 1 started on 01/26/2018, cycle 4 on 04/13/2018, she is asynchronous with Revlimid and Velcade.  We have held her Revlimid once when [place] dropped to 600 during cycle 4. - Her Revlimid was dose reduced to 20 mg because of cytopenias.   - Myeloma test results from 08/19/2018 showed SPEP has become negative.  Free light chain ratio has gone up to 2.1 although kappa light chains have decreased from 46-35.  Labs done 10/14/2018 reviewed and showed WBC 3.4 Hb 8.7 Plts 141,000.  Chemistries show K+ 3.1 and Cr 1.78.  SPEP on labs done 10/07/2018 showed an M spike of 0.5 g/dl.  Will continue Velcade and repeat labs in 6 weeks.  Pt will follow-up with Dr. Worthy Keeler.    2.  Bone strengthening: She will continue calcium and vitamin D twice daily.  She will continue Xgeva as directed.    3.  Hypokalemia: Potassium 3.1.  Pt should continue potassium 20 mEq twice a day.  4.  Anemia.  HB stable at 8.7.  Pt will have repeat labs in 6 weeks.    Current Status:  Pt is seen today for follow-up prior to Velcade.     Multiple myeloma not having achieved  remission ([place])   01/20/2018 Initial Diagnosis    Multiple myeloma not having achieved remission ([place])    01/23/2018 -  Chemotherapy    The patient had bortezomib SQ (VELCADE) chemo injection 2.5 mg, 1.3 mg/m2 = 2.5 mg, Subcutaneous,  Once, 10 of 12 cycles Administration: 2.5 mg (01/26/2018), 2.5 mg (02/02/2018), 2.5 mg (02/09/2018), 2.5 mg (02/16/2018), 2.5 mg (02/23/2018), 2.5 mg (03/02/2018), 2.5 mg (03/09/2018), 2.5 mg (03/30/2018), 2.5 mg (04/06/2018), 2.5 mg (04/13/2018), 2.5 mg (04/21/2018), 2.5 mg (05/06/2018), 2.5 mg (05/11/2018), 2.5 mg (05/20/2018), 2.5 mg (05/27/2018), 2.5 mg (06/10/2018), 2.5 mg (06/17/2018), 2.5 mg (06/24/2018), 2.5 mg (07/08/2018), 2.5 mg (07/15/2018), 2.5 mg (07/22/2018), 2.5 mg (08/05/2018), 2.5 mg (08/12/2018), 2.5 mg (09/02/2018), 2.5 mg (09/09/2018), 2.5 mg (09/16/2018), 2.5 mg (09/30/2018), 2.5 mg (10/07/2018), 2.5 mg (10/14/2018)  for chemotherapy treatment.       Problem List Patient Active Problem List   Diagnosis Date Noted  . B12 deficiency [E53.8] 02/09/2018  . Multiple myeloma not having achieved remission ([place]) [C90.00] 01/20/2018  . Iron deficiency anemia [D50.9] 11/25/2017    Past Medical History Past Medical History:  Diagnosis Date  . Breast cancer ([place])    left breast/ 2008/ surg/ rad tx  . Coronary artery disease   . Diabetes mellitus     Past Surgical History Past Surgical History:  Procedure Laterality Date  . ABDOMINAL HYSTERECTOMY    . BREAST SURGERY  Family History Family History  Problem Relation Age of Onset  . Obesity Sister      Social History  reports that she has never smoked. She has never used smokeless tobacco. She reports that she does not drink alcohol or use drugs.  Medications  Current Outpatient Medications:  .  acetaminophen (TYLENOL) 500 MG tablet, Take 500 mg by mouth every 6 (six) hours as needed for mild pain or moderate pain., Disp: , Rfl:  .  acyclovir (ZOVIRAX) 400 MG tablet, Take 1 tablet (400 mg total) by mouth 2  (two) times daily., Disp: 60 tablet, Rfl: 3 .  aspirin 81 MG tablet, Take 81 mg by mouth daily.  , Disp: , Rfl:  .  bortezomib IV (VELCADE) 3.5 MG injection, Inject into the vein once. weekly, Disp: , Rfl:  .  cholecalciferol (VITAMIN D) 1000 units tablet, Take 1,000 Units by mouth daily., Disp: , Rfl:  .  Denosumab (XGEVA [place]), Inject into the skin. Every 28 days, Disp: , Rfl:  .  dexamethasone (DECADRON) 4 MG tablet, Take 10 tablets (40 mg) on days 1, 8, and 15 of chemo. Repeat every 21 days., Disp: 30 tablet, Rfl: 3 .  glipiZIDE (GLUCOTROL) 5 MG tablet, , Disp: , Rfl:  .  lenalidomide (REVLIMID) 20 MG capsule, Take one capsule daily on days 1-14 every 21 days., Disp: 14 capsule, Rfl: 0 .  lisinopril-hydrochlorothiazide (PRINZIDE,ZESTORETIC) 20-25 MG tablet, , Disp: , Rfl:  .  metFORMIN (GLUCOPHAGE) 1000 MG tablet, Take 1,000 mg by mouth 2 times daily at 12 noon and 4 pm.  , Disp: , Rfl:  .  ondansetron (ZOFRAN) 8 MG tablet, Take 1 tablet (8 mg total) by mouth 2 (two) times daily as needed (Nausea or vomiting)., Disp: 30 tablet, Rfl: 1 .  potassium chloride SA (K-DUR,KLOR-CON) 20 MEQ tablet, Take 1 tablet (20 mEq total) by mouth 2 (two) times daily., Disp: 60 tablet, Rfl: 3 .  prochlorperazine (COMPAZINE) 10 MG tablet, Take 1 tablet (10 mg total) by mouth every 6 (six) hours as needed (Nausea or vomiting)., Disp: 30 tablet, Rfl: 1  Allergies Motrin [ibuprofen]  Review of Systems Review of Systems - Oncology ROS negative.    Physical Exam  Vitals Wt Readings from Last 3 Encounters:  10/14/18 150 lb 1.6 oz (68.1 kg)  10/07/18 152 lb 9.6 oz (69.2 kg)  09/30/18 155 lb (70.3 kg)   Temp Readings from Last 3 Encounters:  10/14/18 98.1 F (36.7 C) (Oral)  10/07/18 (!) 97.3 F (36.3 C) (Oral)  09/30/18 98.2 F (36.8 C) (Oral)   BP Readings from Last 3 Encounters:  10/14/18 (!) 91/45  10/07/18 (!) 100/48  09/30/18 (!) 119/44   Pulse Readings from Last 3 Encounters:  10/14/18 66   10/07/18 60  09/30/18 (!) 59   Constitutional: Well-developed, well-nourished, and in no distress.   HENT: Head: Normocephalic and atraumatic.  Mouth/Throat: No oropharyngeal exudate. Mucosa moist. Eyes: Pupils are equal, round, and reactive to light. Conjunctivae are normal. No scleral icterus.  Neck: Normal range of motion. Neck supple. No JVD present.  Cardiovascular: Normal rate, regular rhythm and normal heart sounds.  Exam reveals no gallop and no friction rub.   No murmur heard. Pulmonary/Chest: Effort normal and breath sounds normal. No respiratory distress. No wheezes.No rales.  Abdominal: Soft. Bowel sounds are normal. No distension. There is no tenderness. There is no guarding.  Musculoskeletal: No edema or tenderness.  Lymphadenopathy: No cervical, axillary or supraclavicular adenopathy.  Neurological: Alert  and oriented to person, place, and time. No cranial nerve deficit.  Skin: Skin is warm and dry. No rash noted. No erythema. No pallor.  Psychiatric: Affect and judgment normal.   Labs Appointment on 10/14/2018  Component Date Value Ref Range Status  . WBC 10/14/2018 3.4* 4.0 - 10.5 K/uL Final  . RBC 10/14/2018 2.61* 3.87 - 5.11 MIL/uL Final  . Hemoglobin 10/14/2018 8.7* 12.0 - 15.0 g/dL Final  . HCT 10/14/2018 26.7* 36.0 - 46.0 % Final  . MCV 10/14/2018 102.3* 80.0 - 100.0 fL Final  . MCH 10/14/2018 33.3  26.0 - 34.0 pg Final  . MCHC 10/14/2018 32.6  30.0 - 36.0 g/dL Final  . RDW 10/14/2018 16.3* 11.5 - 15.5 % Final  . Platelets 10/14/2018 141* 150 - 400 K/uL Final  . nRBC 10/14/2018 0.0  0.0 - 0.2 % Final  . Neutrophils Relative % 10/14/2018 63  % Final  . Neutro Abs 10/14/2018 2.1  1.7 - 7.7 K/uL Final  . Lymphocytes Relative 10/14/2018 21  % Final  . Lymphs Abs 10/14/2018 0.7  0.7 - 4.0 K/uL Final  . Monocytes Relative 10/14/2018 12  % Final  . Monocytes Absolute 10/14/2018 0.4  0.1 - 1.0 K/uL Final  . Eosinophils Relative 10/14/2018 3  % Final  .  Eosinophils Absolute 10/14/2018 0.1  0.0 - 0.5 K/uL Final  . Basophils Relative 10/14/2018 1  % Final  . Basophils Absolute 10/14/2018 0.0  0.0 - 0.1 K/uL Final  . Immature Granulocytes 10/14/2018 0  % Final  . Abs Immature Granulocytes 10/14/2018 0.01  0.00 - 0.07 K/uL Final   Performed at [facility], [address], [place], [place] [address]  . Sodium 10/14/2018 137  135 - 145 mmol/L Final  . Potassium 10/14/2018 3.1* 3.5 - 5.1 mmol/L Final  . Chloride 10/14/2018 110  98 - 111 mmol/L Final  . CO2 10/14/2018 16* 22 - 32 mmol/L Final  . Glucose, Bld 10/14/2018 89  70 - 99 mg/dL Final  . BUN 10/14/2018 69* 8 - 23 mg/dL Final  . Creatinine, Ser 10/14/2018 1.78* 0.44 - 1.00 mg/dL Final  . Calcium 10/14/2018 8.1* 8.9 - 10.3 mg/dL Final  . Total Protein 10/14/2018 7.3  6.5 - 8.1 g/dL Final  . Albumin 10/14/2018 3.9  3.5 - 5.0 g/dL Final  . AST 10/14/2018 13* 15 - 41 U/L Final  . ALT 10/14/2018 9  0 - 44 U/L Final  . Alkaline Phosphatase 10/14/2018 55  38 - 126 U/L Final  . Total Bilirubin 10/14/2018 0.5  0.3 - 1.2 mg/dL Final  . GFR calc non Af Amer 10/14/2018 26* >60 mL/min Final  . GFR calc Af Amer 10/14/2018 30* >60 mL/min Final   Comment: (NOTE) The eGFR has been calculated using the CKD EPI equation. This calculation has not been validated in all clinical situations. eGFR's persistently <60 mL/min signify possible Chronic Kidney Disease.   Georgiann Hahn gap 10/14/2018 11  5 - 15 Final   Performed at [facility], [address], [place], [place] [address]     Pathology No orders of the defined types were placed in this encounter.      Zoila Shutter MD

## 2018-10-31 ENCOUNTER — Inpatient Hospital Stay (HOSPITAL_COMMUNITY): Payer: Medicare Other

## 2018-10-31 ENCOUNTER — Inpatient Hospital Stay (HOSPITAL_COMMUNITY): Payer: Medicare Other | Attending: Hematology

## 2018-10-31 ENCOUNTER — Other Ambulatory Visit (HOSPITAL_COMMUNITY): Payer: Self-pay | Admitting: Hematology

## 2018-10-31 VITALS — BP 110/45 | HR 65 | Temp 97.3°F | Resp 18 | Wt 152.2 lb

## 2018-10-31 DIAGNOSIS — C9000 Multiple myeloma not having achieved remission: Secondary | ICD-10-CM

## 2018-10-31 DIAGNOSIS — Z5112 Encounter for antineoplastic immunotherapy: Secondary | ICD-10-CM | POA: Insufficient documentation

## 2018-10-31 DIAGNOSIS — E538 Deficiency of other specified B group vitamins: Secondary | ICD-10-CM | POA: Insufficient documentation

## 2018-10-31 DIAGNOSIS — D649 Anemia, unspecified: Secondary | ICD-10-CM | POA: Diagnosis not present

## 2018-10-31 DIAGNOSIS — Z5111 Encounter for antineoplastic chemotherapy: Secondary | ICD-10-CM

## 2018-10-31 LAB — CBC WITH DIFFERENTIAL/PLATELET
Abs Immature Granulocytes: 0.01 10*3/uL (ref 0.00–0.07)
BASOS ABS: 0 10*3/uL (ref 0.0–0.1)
Basophils Relative: 1 %
EOS ABS: 0.2 10*3/uL (ref 0.0–0.5)
Eosinophils Relative: 5 %
HEMATOCRIT: 23 % — AB (ref 36.0–46.0)
HEMOGLOBIN: 7.3 g/dL — AB (ref 12.0–15.0)
IMMATURE GRANULOCYTES: 0 %
LYMPHS ABS: 0.9 10*3/uL (ref 0.7–4.0)
LYMPHS PCT: 30 %
MCH: 33.5 pg (ref 26.0–34.0)
MCHC: 31.7 g/dL (ref 30.0–36.0)
MCV: 105.5 fL — ABNORMAL HIGH (ref 80.0–100.0)
Monocytes Absolute: 0.4 10*3/uL (ref 0.1–1.0)
Monocytes Relative: 14 %
NEUTROS PCT: 50 %
NRBC: 0 % (ref 0.0–0.2)
Neutro Abs: 1.6 10*3/uL — ABNORMAL LOW (ref 1.7–7.7)
PLATELETS: 175 10*3/uL (ref 150–400)
RBC: 2.18 MIL/uL — AB (ref 3.87–5.11)
RDW: 16.9 % — ABNORMAL HIGH (ref 11.5–15.5)
WBC: 3.1 10*3/uL — AB (ref 4.0–10.5)

## 2018-10-31 LAB — COMPREHENSIVE METABOLIC PANEL
ALK PHOS: 43 U/L (ref 38–126)
ALT: 7 U/L (ref 0–44)
AST: 12 U/L — ABNORMAL LOW (ref 15–41)
Albumin: 3.5 g/dL (ref 3.5–5.0)
Anion gap: 10 (ref 5–15)
BUN: 25 mg/dL — ABNORMAL HIGH (ref 8–23)
CALCIUM: 8.8 mg/dL — AB (ref 8.9–10.3)
CHLORIDE: 105 mmol/L (ref 98–111)
CO2: 22 mmol/L (ref 22–32)
Creatinine, Ser: 1.28 mg/dL — ABNORMAL HIGH (ref 0.44–1.00)
GFR, EST AFRICAN AMERICAN: 45 mL/min — AB (ref >60)
GFR, EST NON AFRICAN AMERICAN: 39 mL/min — AB (ref >60)
Glucose, Bld: 41 mg/dL — CL (ref 70–99)
Potassium: 2.8 mmol/L — ABNORMAL LOW (ref 3.5–5.1)
Sodium: 137 mmol/L (ref 135–145)
Total Bilirubin: 0.8 mg/dL (ref 0.3–1.2)
Total Protein: 6.6 g/dL (ref 6.5–8.1)

## 2018-10-31 LAB — PREPARE RBC (CROSSMATCH)

## 2018-10-31 MED ORDER — POTASSIUM CHLORIDE CRYS ER 20 MEQ PO TBCR
40.0000 meq | EXTENDED_RELEASE_TABLET | Freq: Once | ORAL | Status: AC
  Administered 2018-10-31: 40 meq via ORAL

## 2018-10-31 MED ORDER — BORTEZOMIB CHEMO SQ INJECTION 3.5 MG (2.5MG/ML)
1.3000 mg/m2 | Freq: Once | INTRAMUSCULAR | Status: AC
  Administered 2018-10-31: 2.5 mg via SUBCUTANEOUS
  Filled 2018-10-31: qty 1

## 2018-10-31 MED ORDER — PROCHLORPERAZINE MALEATE 10 MG PO TABS
ORAL_TABLET | ORAL | Status: AC
  Filled 2018-10-31: qty 1

## 2018-10-31 MED ORDER — PROCHLORPERAZINE MALEATE 10 MG PO TABS
10.0000 mg | ORAL_TABLET | Freq: Once | ORAL | Status: AC
  Administered 2018-10-31: 10 mg via ORAL

## 2018-10-31 MED ORDER — POTASSIUM CHLORIDE CRYS ER 20 MEQ PO TBCR
EXTENDED_RELEASE_TABLET | ORAL | Status: AC
  Filled 2018-10-31: qty 1

## 2018-10-31 NOTE — Patient Instructions (Signed)
[facility] Discharge Instructions for Patients Receiving Chemotherapy   Beginning January 23rd 2017 lab work for the Cancer Center will be done in the  Main lab at [facility] on 1st floor. If you have a lab appointment with the Cancer Center please come in thru the  Main Entrance and check in at the main information desk   Today you received the following chemotherapy agents Velcade  To help prevent nausea and vomiting after your treatment, we encourage you to take your nausea medication    If you develop nausea and vomiting, or diarrhea that is not controlled by your medication, call the clinic.  The clinic phone number is [phone]. Office hours are Monday-Friday 8:30am-5:00pm.  BELOW ARE SYMPTOMS THAT SHOULD BE REPORTED IMMEDIATELY:  *FEVER GREATER THAN 101.0 F  *CHILLS WITH OR WITHOUT FEVER  NAUSEA AND VOMITING THAT IS NOT CONTROLLED WITH YOUR NAUSEA MEDICATION  *UNUSUAL SHORTNESS OF BREATH  *UNUSUAL BRUISING OR BLEEDING  TENDERNESS IN MOUTH AND THROAT WITH OR WITHOUT PRESENCE OF ULCERS  *URINARY PROBLEMS  *BOWEL PROBLEMS  UNUSUAL RASH Items with * indicate a potential emergency and should be followed up as soon as possible. If you have an emergency after office hours please contact your primary care physician or go to the nearest emergency department.  Please call the clinic during office hours if you have any questions or concerns.   You may also contact the Patient Navigator at [phone] should you have any questions or need assistance in obtaining follow up care.      Resources For Cancer Patients and their Caregivers ? American Cancer Society: Can assist with transportation, wigs, general needs, runs Look Good Feel Better.        1-888-227-6333 ? Cancer Care: Provides financial assistance, online support groups, medication/co-pay assistance.  1-800-813-HOPE (4673) ? [facility] Assists [place] cancer  patients and their families through emotional , educational and financial support.  336-427-4357 ? [place] DSS Where to apply for food stamps, Medicaid and utility assistance. 336-342-1394 ? RCATS: Transportation to medical appointments. 336-347-2287 ? Social Security Administration: May apply for disability if have a Stage IV cancer. 336-342-7796 1-800-772-1213 ? [place] Aging, Disability and Transit Services: Assists with nutrition, care and transit needs. 336-349-2343          

## 2018-10-31 NOTE — Progress Notes (Signed)
1400 labs reviewed with Dr. Delton Coombes, including blood glucose 41, hgb 7.3, and K+ 2.8. Order received to transfuse one unit PRBC tomorrow and to give 70mEq potassium PO x2 today. Drink and snack given to patient for low glucose. Pt states she feels better after eating. Dr. Delton Coombes also approved pt for Velcade injection today.  Hillari Zumwalt Lodato tolerated Velcade injection into left abdomen without incident or complaint. VSS. Pt and niece made aware of appointment time for tomorrows blood transfusion in Short Stay. Verbalized understanding. Pt discharged self ambulatory in satisfactory condition in presence of family members.

## 2018-11-01 ENCOUNTER — Encounter (HOSPITAL_COMMUNITY)
Admission: RE | Admit: 2018-11-01 | Discharge: 2018-11-01 | Disposition: A | Discharge status: Home / Self Care | Payer: Medicare Other | Source: Ambulatory Visit | Attending: Hematology | Admitting: Hematology

## 2018-11-01 ENCOUNTER — Encounter (HOSPITAL_COMMUNITY): Payer: Self-pay

## 2018-11-01 DIAGNOSIS — C9000 Multiple myeloma not having achieved remission: Secondary | ICD-10-CM | POA: Insufficient documentation

## 2018-11-01 MED ORDER — SODIUM CHLORIDE 0.9% IV SOLUTION
250.0000 mL | Freq: Once | INTRAVENOUS | Status: AC
  Administered 2018-11-01: 250 mL via INTRAVENOUS

## 2018-11-01 MED ORDER — DIPHENHYDRAMINE HCL 25 MG PO CAPS
25.0000 mg | ORAL_CAPSULE | Freq: Once | ORAL | Status: AC
  Administered 2018-11-01: 25 mg via ORAL
  Filled 2018-11-01: qty 1

## 2018-11-01 MED ORDER — ACETAMINOPHEN 325 MG PO TABS
650.0000 mg | ORAL_TABLET | Freq: Once | ORAL | Status: AC
  Administered 2018-11-01: 650 mg via ORAL
  Filled 2018-11-01: qty 2

## 2018-11-02 LAB — TYPE AND SCREEN
ABO/RH(D): O POS
Antibody Screen: NEGATIVE
Unit division: 0

## 2018-11-02 LAB — BPAM RBC
Blood Product Expiration Date: 201912022359
ISSUE DATE / TIME: 201911050901
Unit Type and Rh: 5100

## 2018-11-03 ENCOUNTER — Encounter (HOSPITAL_COMMUNITY): Payer: Self-pay | Admitting: *Deleted

## 2018-11-03 ENCOUNTER — Emergency Department (HOSPITAL_COMMUNITY)
Admission: EM | Admit: 2018-11-03 | Discharge: 2018-11-03 | Disposition: A | Discharge status: Home / Self Care | Payer: Medicare Other | Attending: Emergency Medicine | Admitting: Emergency Medicine

## 2018-11-03 ENCOUNTER — Other Ambulatory Visit: Payer: Self-pay

## 2018-11-03 DIAGNOSIS — C9000 Multiple myeloma not having achieved remission: Secondary | ICD-10-CM | POA: Diagnosis not present

## 2018-11-03 DIAGNOSIS — Z79899 Other long term (current) drug therapy: Secondary | ICD-10-CM | POA: Insufficient documentation

## 2018-11-03 DIAGNOSIS — Z7982 Long term (current) use of aspirin: Secondary | ICD-10-CM | POA: Insufficient documentation

## 2018-11-03 DIAGNOSIS — Z853 Personal history of malignant neoplasm of breast: Secondary | ICD-10-CM | POA: Diagnosis not present

## 2018-11-03 DIAGNOSIS — Z9221 Personal history of antineoplastic chemotherapy: Secondary | ICD-10-CM | POA: Diagnosis not present

## 2018-11-03 DIAGNOSIS — I2510 Atherosclerotic heart disease of native coronary artery without angina pectoris: Secondary | ICD-10-CM | POA: Insufficient documentation

## 2018-11-03 DIAGNOSIS — Z7984 Long term (current) use of oral hypoglycemic drugs: Secondary | ICD-10-CM | POA: Diagnosis not present

## 2018-11-03 DIAGNOSIS — E119 Type 2 diabetes mellitus without complications: Secondary | ICD-10-CM | POA: Insufficient documentation

## 2018-11-03 DIAGNOSIS — E11649 Type 2 diabetes mellitus with hypoglycemia without coma: Secondary | ICD-10-CM | POA: Diagnosis not present

## 2018-11-03 DIAGNOSIS — E162 Hypoglycemia, unspecified: Secondary | ICD-10-CM

## 2018-11-03 DIAGNOSIS — R5381 Other malaise: Secondary | ICD-10-CM | POA: Diagnosis present

## 2018-11-03 LAB — BASIC METABOLIC PANEL
ANION GAP: 8 (ref 5–15)
BUN: 20 mg/dL (ref 8–23)
CALCIUM: 8.4 mg/dL — AB (ref 8.9–10.3)
CHLORIDE: 112 mmol/L — AB (ref 98–111)
CO2: 21 mmol/L — ABNORMAL LOW (ref 22–32)
CREATININE: 1.09 mg/dL — AB (ref 0.44–1.00)
GFR calc Af Amer: 55 mL/min — ABNORMAL LOW (ref >60)
GFR calc non Af Amer: 47 mL/min — ABNORMAL LOW (ref >60)
Glucose, Bld: 159 mg/dL — ABNORMAL HIGH (ref 70–99)
Potassium: 4 mmol/L (ref 3.5–5.1)
SODIUM: 141 mmol/L (ref 135–145)

## 2018-11-03 LAB — CBC
HEMATOCRIT: 28.3 % — AB (ref 36.0–46.0)
Hemoglobin: 8.9 g/dL — ABNORMAL LOW (ref 12.0–15.0)
MCH: 32.6 pg (ref 26.0–34.0)
MCHC: 31.4 g/dL (ref 30.0–36.0)
MCV: 103.7 fL — ABNORMAL HIGH (ref 80.0–100.0)
NRBC: 0.9 % — AB (ref 0.0–0.2)
PLATELETS: 169 10*3/uL (ref 150–400)
RBC: 2.73 MIL/uL — AB (ref 3.87–5.11)
RDW: 17.9 % — AB (ref 11.5–15.5)
WBC: 2.1 10*3/uL — AB (ref 4.0–10.5)

## 2018-11-03 LAB — CBG MONITORING, ED
GLUCOSE-CAPILLARY: 97 mg/dL (ref 70–99)
Glucose-Capillary: 145 mg/dL — ABNORMAL HIGH (ref 70–99)
Glucose-Capillary: 146 mg/dL — ABNORMAL HIGH (ref 70–99)

## 2018-11-03 NOTE — ED Notes (Signed)
Patient arrived soiled from urine.  Patient's clothing was removed and pads in place.

## 2018-11-03 NOTE — ED Notes (Signed)
Patient ambulatory to restroom with assistance.  Patient walked with no problems

## 2018-11-03 NOTE — ED Triage Notes (Signed)
Pt brought in by [facility] for c/o low blood sugar; pt was found to have a cbg of 44 on scene with ems; ems administered 1/2 amp of D50; cbg increased 133; pt is alert and oriented at this time; pt is a cancer pt and is taking chemo; pt c/o numbness and tingling to bilateral feet; pt c/o generalized weakness

## 2018-11-03 NOTE — ED Provider Notes (Signed)
[facility] EMERGENCY DEPARTMENT Provider Note   CSN: [id number] Arrival date & time: 11/03/18  3536     History   Chief Complaint Chief Complaint  Patient presents with  . Hypoglycemia    HPI MADGELINE RAYO is a 77 y.o. female.  Patient was very weak this morning when she woke up.  Check she felt some weakness before she went to bed.  She was too weak or feeling too dizzy and lightheaded when she got up so her family member that stays with her this week called EMS.  EMS noted that patient's blood sugar was 44 at the scene.  EMS gave a half amp of D50.  Blood sugar increased to 133.  Following this patient became alert and oriented.  Patient is a cancer patient undergoes chemotherapy for multiple myeloma.  Patient had some long-term complaints of some right-sided abdominal pain and numbness and tingling in both feet.  Patient is also had some complaint for generalized weakness but it was much worse this morning.  Patient denied any chest pain or shortness of breath.  There was no fall she did not hit her head.  Was reported to that may be her speech was a little slurred.  All this corrected itself when the blood sugar improved.     Past Medical History:  Diagnosis Date  . Breast cancer ([place])    left breast/ 2008/ surg/ rad tx  . Coronary artery disease   . Diabetes mellitus     Patient Active Problem List   Diagnosis Date Noted  . B12 deficiency 02/09/2018  . Multiple myeloma not having achieved remission ([place]) 01/20/2018  . Iron deficiency anemia 11/25/2017    Past Surgical History:  Procedure Laterality Date  . ABDOMINAL HYSTERECTOMY    . BREAST SURGERY       OB History   None      Home Medications    Prior to Admission medications   Medication Sig Start Date End Date Taking? Authorizing Provider  acetaminophen (TYLENOL) 500 MG tablet Take 500 mg by mouth every 6 (six) hours as needed for mild pain or moderate pain.   Yes [provider]    acyclovir (ZOVIRAX) 400 MG tablet Take 1 tablet (400 mg total) by mouth 2 (two) times daily. 09/09/18  Yes Higgs, Mathis Dad, MD  aspirin 81 MG tablet Take 81 mg by mouth daily.     Yes [provider]  cholecalciferol (VITAMIN D) 1000 units tablet Take 1,000 Units by mouth daily.   Yes [provider]  Denosumab (XGEVA [place]) Inject into the skin. Every 28 days   Yes [provider]  dexamethasone (DECADRON) 4 MG tablet Take 10 tablets (40 mg) on days 1, 8, and 15 of chemo. Repeat every 21 days. 08/19/18  Yes Derek Jack, MD  glipiZIDE (GLUCOTROL) 5 MG tablet Take 5 mg by mouth daily before breakfast.  01/31/18  Yes [provider]  lenalidomide (REVLIMID) 20 MG capsule Take one capsule daily on days 1-14 every 21 days. 10/03/18  Yes Derek Jack, MD  lisinopril-hydrochlorothiazide (PRINZIDE,ZESTORETIC) 20-25 MG tablet Take 1 tablet by mouth daily.  01/12/18  Yes [provider]  metFORMIN (GLUCOPHAGE) 1000 MG tablet Take 1,000 mg by mouth 2 times daily at 12 noon and 4 pm.     Yes [provider]  ondansetron (ZOFRAN) 8 MG tablet Take 1 tablet (8 mg total) by mouth 2 (two) times daily as needed (Nausea or vomiting). 01/26/18  Yes Perumandla,  Pascal Lux, MD  potassium chloride SA (K-DUR,KLOR-CON) 20 MEQ tablet Take 1 tablet (20 mEq total) by mouth 2 (two) times daily. 09/30/18  Yes Derek Jack, MD  prochlorperazine (COMPAZINE) 10 MG tablet Take 1 tablet (10 mg total) by mouth every 6 (six) hours as needed (Nausea or vomiting). 01/26/18  Yes Creola Corn, MD  bortezomib IV (VELCADE) 3.5 MG injection Inject into the vein once. weekly    [provider]    Family History Family History  Problem Relation Age of Onset  . Obesity Sister     Social History Social History   Tobacco Use  . Smoking status: Never Smoker  . Smokeless tobacco: Never Used  Substance Use Topics  . Alcohol use: No  . Drug use: No      Allergies   Motrin [ibuprofen]   Review of Systems Review of Systems  Constitutional: Positive for fatigue. Negative for fever.  HENT: Negative for congestion.   Eyes: Negative for visual disturbance.  Respiratory: Negative for shortness of breath.   Cardiovascular: Negative for chest pain.  Gastrointestinal: Positive for abdominal pain.  Genitourinary: Negative for dysuria.  Musculoskeletal: Negative for back pain and neck pain.  Skin: Negative for rash.  Neurological: Positive for speech difficulty, weakness, light-headedness and numbness. Negative for dizziness.  Hematological: Does not bruise/bleed easily.  Psychiatric/Behavioral: Negative for confusion.     Physical Exam Updated Vital Signs BP (!) 119/55   Pulse 60   Temp 98.2 F (36.8 C) (Oral)   Resp 17   Ht 1.6 m (_0 )   Wt 69 kg   SpO2 100%   BMI 26.96 kg/m   Physical Exam  Constitutional: She is oriented to person, place, and time. She appears well-developed and well-nourished. No distress.  HENT:  Head: Normocephalic and atraumatic.  Mouth/Throat: Oropharynx is clear and moist.  Eyes: Pupils are equal, round, and reactive to light. EOM are normal.  Neck: Normal range of motion. Neck supple.  Cardiovascular: Normal rate, regular rhythm and normal heart sounds.  Pulmonary/Chest: Effort normal and breath sounds normal.  Abdominal: Soft. Bowel sounds are normal.  Musculoskeletal: Normal range of motion.  Neurological: She is alert and oriented to person, place, and time. No cranial nerve deficit or sensory deficit. She exhibits normal muscle tone. Coordination normal.  Neuro exam normal.  No evidence of any vertigo.  Patient able to ambulate fine.  No further lightheadedness or dizziness.  Speech is normal.  Skin: Skin is warm.  Nursing note and vitals reviewed.    ED Treatments / Results  Labs (all labs ordered are listed, but only abnormal results are displayed) Labs Reviewed  BASIC METABOLIC  PANEL - Abnormal; Notable for the following components:      Result Value   Chloride 112 (*)    CO2 21 (*)    Glucose, Bld 159 (*)    Creatinine, Ser 1.09 (*)    Calcium 8.4 (*)    GFR calc non Af Amer 47 (*)    GFR calc Af Amer 55 (*)    All other components within normal limits  CBC - Abnormal; Notable for the following components:   WBC 2.1 (*)    RBC 2.73 (*)    Hemoglobin 8.9 (*)    HCT 28.3 (*)    MCV 103.7 (*)    RDW 17.9 (*)    nRBC 0.9 (*)    All other components within normal limits  CBG MONITORING, ED - Abnormal; Notable for the following  components:   Glucose-Capillary 145 (*)    All other components within normal limits  CBG MONITORING, ED - Abnormal; Notable for the following components:   Glucose-Capillary 146 (*)    All other components within normal limits  CBG MONITORING, ED    EKG None  Radiology No results found.  Procedures Procedures (including critical care time)  Medications Ordered in ED Medications - No data to display   Initial Impression / Assessment and Plan / ED Course  I have reviewed the triage vital signs and the nursing notes.  Pertinent labs & imaging results that were available during my care of the patient were reviewed by me and considered in my medical decision making (see chart for details).     Patient symptoms seem to be entirely related to the low blood sugar.  When that was corrected all symptoms resolved.  Family stated that when she was at the hematology oncology clinic a few days ago blood sugar was low than as well.  Patient is not on anything the lower blood pressure.  Discussed with family may be the concerns that she is not getting enough caloric intake.  Talked about Ensure.  Here patient to maintain her blood sugar.  She did eat.  Blood sugars staying in the 140s.  Patient was ambulated no further symptoms.  Patient back to baseline.  Labs without significant abnormalities.  Final Clinical Impressions(s) / ED  Diagnoses   Final diagnoses:  Hypoglycemia    ED Discharge Orders    None       Fredia Sorrow, MD 11/03/18 1515

## 2018-11-03 NOTE — ED Notes (Signed)
Given graham crackers. Orange juice, cereal and milk.

## 2018-11-03 NOTE — Discharge Instructions (Addendum)
Recommend following up with your doctor for blood sugar checks.  Check your sugars carefully at home.  Would recommend a calorie supplement like Ensure at least twice a day.  Return for any new or worse symptoms.

## 2018-11-07 ENCOUNTER — Inpatient Hospital Stay (HOSPITAL_COMMUNITY): Payer: Medicare Other

## 2018-11-07 VITALS — BP 109/50 | HR 70 | Temp 98.9°F | Resp 18 | Wt 153.2 lb

## 2018-11-07 DIAGNOSIS — C9000 Multiple myeloma not having achieved remission: Secondary | ICD-10-CM

## 2018-11-07 DIAGNOSIS — Z5112 Encounter for antineoplastic immunotherapy: Secondary | ICD-10-CM | POA: Diagnosis not present

## 2018-11-07 DIAGNOSIS — E538 Deficiency of other specified B group vitamins: Secondary | ICD-10-CM

## 2018-11-07 DIAGNOSIS — Z5111 Encounter for antineoplastic chemotherapy: Secondary | ICD-10-CM

## 2018-11-07 LAB — COMPREHENSIVE METABOLIC PANEL
ALT: 7 U/L (ref 0–44)
AST: 12 U/L — AB (ref 15–41)
Albumin: 3.4 g/dL — ABNORMAL LOW (ref 3.5–5.0)
Alkaline Phosphatase: 48 U/L (ref 38–126)
Anion gap: 7 (ref 5–15)
BILIRUBIN TOTAL: 0.8 mg/dL (ref 0.3–1.2)
BUN: 20 mg/dL (ref 8–23)
CALCIUM: 8.5 mg/dL — AB (ref 8.9–10.3)
CHLORIDE: 107 mmol/L (ref 98–111)
CO2: 25 mmol/L (ref 22–32)
Creatinine, Ser: 1.13 mg/dL — ABNORMAL HIGH (ref 0.44–1.00)
GFR, EST AFRICAN AMERICAN: 53 mL/min — AB (ref >60)
GFR, EST NON AFRICAN AMERICAN: 45 mL/min — AB (ref >60)
GLUCOSE: 82 mg/dL (ref 70–99)
POTASSIUM: 3.2 mmol/L — AB (ref 3.5–5.1)
Sodium: 139 mmol/L (ref 135–145)
Total Protein: 6.6 g/dL (ref 6.5–8.1)

## 2018-11-07 LAB — CBC WITH DIFFERENTIAL/PLATELET
Abs Immature Granulocytes: 0.02 10*3/uL (ref 0.00–0.07)
BASOS PCT: 1 %
Basophils Absolute: 0 10*3/uL (ref 0.0–0.1)
EOS ABS: 0.2 10*3/uL (ref 0.0–0.5)
Eosinophils Relative: 4 %
HEMATOCRIT: 26.4 % — AB (ref 36.0–46.0)
Hemoglobin: 8.3 g/dL — ABNORMAL LOW (ref 12.0–15.0)
IMMATURE GRANULOCYTES: 1 %
LYMPHS ABS: 0.8 10*3/uL (ref 0.7–4.0)
Lymphocytes Relative: 20 %
MCH: 32.3 pg (ref 26.0–34.0)
MCHC: 31.4 g/dL (ref 30.0–36.0)
MCV: 102.7 fL — ABNORMAL HIGH (ref 80.0–100.0)
MONO ABS: 0.4 10*3/uL (ref 0.1–1.0)
Monocytes Relative: 9 %
NEUTROS PCT: 65 %
Neutro Abs: 2.6 10*3/uL (ref 1.7–7.7)
Platelets: 173 10*3/uL (ref 150–400)
RBC: 2.57 MIL/uL — ABNORMAL LOW (ref 3.87–5.11)
RDW: 17 % — AB (ref 11.5–15.5)
WBC: 3.9 10*3/uL — ABNORMAL LOW (ref 4.0–10.5)
nRBC: 0 % (ref 0.0–0.2)

## 2018-11-07 MED ORDER — PROCHLORPERAZINE MALEATE 10 MG PO TABS
10.0000 mg | ORAL_TABLET | Freq: Once | ORAL | Status: AC
  Administered 2018-11-07: 10 mg via ORAL
  Filled 2018-11-07: qty 1

## 2018-11-07 MED ORDER — CYANOCOBALAMIN 1000 MCG/ML IJ SOLN
1000.0000 ug | Freq: Once | INTRAMUSCULAR | Status: AC
  Administered 2018-11-07: 1000 ug via INTRAMUSCULAR
  Filled 2018-11-07: qty 1

## 2018-11-07 MED ORDER — BORTEZOMIB CHEMO SQ INJECTION 3.5 MG (2.5MG/ML)
1.3000 mg/m2 | Freq: Once | INTRAMUSCULAR | Status: AC
  Administered 2018-11-07: 2.5 mg via SUBCUTANEOUS
  Filled 2018-11-07: qty 1

## 2018-11-07 MED ORDER — DENOSUMAB 120 MG/1.7ML ~~LOC~~ SOLN
120.0000 mg | Freq: Once | SUBCUTANEOUS | Status: AC
  Administered 2018-11-07: 120 mg via SUBCUTANEOUS
  Filled 2018-11-07: qty 1.7

## 2018-11-07 NOTE — Patient Instructions (Signed)
[facility] Discharge Instructions for Patients Receiving Chemotherapy   Beginning January 23rd 2017 lab work for the [facility] will be done in the  Main lab at [facility] on 1st floor. If you have a lab appointment with the [facility] please come in thru the  Main Entrance and check in at the main information desk   Today you received the following chemotherapy agents Velcade, Xgeva, and B12 injections.  To help prevent nausea and vomiting after your treatment, we encourage you to take your nausea medication   If you develop nausea and vomiting, or diarrhea that is not controlled by your medication, call the clinic.  The clinic phone number is [phone]. Office hours are Monday-Friday 8:30am-5:00pm.  BELOW ARE SYMPTOMS THAT SHOULD BE REPORTED IMMEDIATELY:  *FEVER GREATER THAN 101.0 F  *CHILLS WITH OR WITHOUT FEVER  NAUSEA AND VOMITING THAT IS NOT CONTROLLED WITH YOUR NAUSEA MEDICATION  *UNUSUAL SHORTNESS OF BREATH  *UNUSUAL BRUISING OR BLEEDING  TENDERNESS IN MOUTH AND THROAT WITH OR WITHOUT PRESENCE OF ULCERS  *URINARY PROBLEMS  *BOWEL PROBLEMS  UNUSUAL RASH Items with * indicate a potential emergency and should be followed up as soon as possible. If you have an emergency after office hours please contact your primary care physician or go to the nearest emergency department.  Please call the clinic during office hours if you have any questions or concerns.   You may also contact the Patient Navigator at [phone] should you have any questions or need assistance in obtaining follow up care.      Resources For Cancer Patients and their Caregivers ? American Cancer Society: Can assist with transportation, wigs, general needs, runs Look Good Feel Better.        404-268-7912 ? Cancer Care: Provides financial assistance, online support groups, medication/co-pay assistance.  1-800-813-HOPE 639 216 5914) ? [facility] Assists [place] cancer patients and their families through emotional , educational and financial support.  618-575-2519 ? [place] DSS Where to apply for food stamps, Medicaid and utility assistance. 870-018-0598 ? RCATS: Transportation to medical appointments. 512 196 0179 ? Social Security Administration: May apply for disability if have a Stage IV cancer. [phone] 423-345-9345 ? [employer], Disability and Transit Services: Assists with nutrition, care and transit needs. 305-533-4538

## 2018-11-07 NOTE — Progress Notes (Signed)
1345 labs reviewed with Dr. Delton Coombes and pt approved for Velcade, B12, and Xgeva injections today.   Brittany Archer tolerated injections without incident or complaint. Denies any recent dental work, tooth, or jaw pain. Ca 8.2 and pt reports taking her Ca supplements. Pt discharged self ambulatory in satisfactory condition in presence of family member.

## 2018-11-09 ENCOUNTER — Other Ambulatory Visit (HOSPITAL_COMMUNITY): Payer: Self-pay | Admitting: *Deleted

## 2018-11-09 DIAGNOSIS — C9000 Multiple myeloma not having achieved remission: Secondary | ICD-10-CM

## 2018-11-09 MED ORDER — LENALIDOMIDE 20 MG PO CAPS: ORAL_CAPSULE | ORAL | 0 refills | Status: DC

## 2018-11-09 NOTE — Telephone Encounter (Signed)
Chart reviewed, per Dr. Higgs last office note, Revlimid refilled. 

## 2018-11-14 ENCOUNTER — Encounter (HOSPITAL_COMMUNITY): Payer: Self-pay

## 2018-11-14 ENCOUNTER — Inpatient Hospital Stay (HOSPITAL_COMMUNITY): Payer: Medicare Other

## 2018-11-14 ENCOUNTER — Other Ambulatory Visit (HOSPITAL_COMMUNITY): Payer: Self-pay | Admitting: *Deleted

## 2018-11-14 VITALS — BP 107/68 | HR 51 | Temp 97.6°F | Resp 18 | Wt 150.2 lb

## 2018-11-14 DIAGNOSIS — E876 Hypokalemia: Secondary | ICD-10-CM

## 2018-11-14 DIAGNOSIS — Z5112 Encounter for antineoplastic immunotherapy: Secondary | ICD-10-CM | POA: Diagnosis not present

## 2018-11-14 DIAGNOSIS — C9000 Multiple myeloma not having achieved remission: Secondary | ICD-10-CM

## 2018-11-14 LAB — COMPREHENSIVE METABOLIC PANEL
ALT: 7 U/L (ref 0–44)
AST: 10 U/L — AB (ref 15–41)
Albumin: 3.5 g/dL (ref 3.5–5.0)
Alkaline Phosphatase: 51 U/L (ref 38–126)
Anion gap: 9 (ref 5–15)
BUN: 17 mg/dL (ref 8–23)
CHLORIDE: 105 mmol/L (ref 98–111)
CO2: 26 mmol/L (ref 22–32)
Calcium: 8.6 mg/dL — ABNORMAL LOW (ref 8.9–10.3)
Creatinine, Ser: 1.17 mg/dL — ABNORMAL HIGH (ref 0.44–1.00)
GFR calc Af Amer: 50 mL/min — ABNORMAL LOW (ref >60)
GFR, EST NON AFRICAN AMERICAN: 43 mL/min — AB (ref >60)
Glucose, Bld: 103 mg/dL — ABNORMAL HIGH (ref 70–99)
Potassium: 2.5 mmol/L — CL (ref 3.5–5.1)
Sodium: 140 mmol/L (ref 135–145)
Total Bilirubin: 0.8 mg/dL (ref 0.3–1.2)
Total Protein: 6.8 g/dL (ref 6.5–8.1)

## 2018-11-14 LAB — CBC WITH DIFFERENTIAL/PLATELET
Abs Immature Granulocytes: 0.02 10*3/uL (ref 0.00–0.07)
BASOS ABS: 0 10*3/uL (ref 0.0–0.1)
Basophils Relative: 0 %
Eosinophils Absolute: 0 10*3/uL (ref 0.0–0.5)
Eosinophils Relative: 1 %
HCT: 27.1 % — ABNORMAL LOW (ref 36.0–46.0)
HEMOGLOBIN: 8.5 g/dL — AB (ref 12.0–15.0)
Immature Granulocytes: 1 %
LYMPHS PCT: 19 %
Lymphs Abs: 0.8 10*3/uL (ref 0.7–4.0)
MCH: 32.2 pg (ref 26.0–34.0)
MCHC: 31.4 g/dL (ref 30.0–36.0)
MCV: 102.7 fL — ABNORMAL HIGH (ref 80.0–100.0)
Monocytes Absolute: 0.3 10*3/uL (ref 0.1–1.0)
Monocytes Relative: 6 %
NEUTROS ABS: 3.2 10*3/uL (ref 1.7–7.7)
NRBC: 0 % (ref 0.0–0.2)
Neutrophils Relative %: 73 %
PLATELETS: 149 10*3/uL — AB (ref 150–400)
RBC: 2.64 MIL/uL — AB (ref 3.87–5.11)
RDW: 16.9 % — ABNORMAL HIGH (ref 11.5–15.5)
WBC: 4.4 10*3/uL (ref 4.0–10.5)

## 2018-11-14 LAB — LACTATE DEHYDROGENASE: LDH: 132 U/L (ref 98–192)

## 2018-11-14 MED ORDER — PROCHLORPERAZINE MALEATE 10 MG PO TABS
10.0000 mg | ORAL_TABLET | Freq: Once | ORAL | Status: AC
  Administered 2018-11-14: 10 mg via ORAL

## 2018-11-14 MED ORDER — PROCHLORPERAZINE MALEATE 10 MG PO TABS
ORAL_TABLET | ORAL | Status: AC
  Filled 2018-11-14: qty 1

## 2018-11-14 MED ORDER — SODIUM CHLORIDE 0.9 % IV SOLN: INTRAVENOUS | Status: DC

## 2018-11-14 MED ORDER — POTASSIUM CHLORIDE 10 MEQ/100ML IV SOLN
10.0000 meq | INTRAVENOUS | Status: AC
  Administered 2018-11-14 (×2): 10 meq via INTRAVENOUS
  Filled 2018-11-14 (×2): qty 100

## 2018-11-14 MED ORDER — POTASSIUM CHLORIDE CRYS ER 20 MEQ PO TBCR
40.0000 meq | EXTENDED_RELEASE_TABLET | Freq: Two times a day (BID) | ORAL | Status: DC
  Administered 2018-11-14 (×2): 40 meq via ORAL
  Filled 2018-11-14 (×2): qty 2

## 2018-11-14 MED ORDER — BORTEZOMIB CHEMO SQ INJECTION 3.5 MG (2.5MG/ML)
1.3000 mg/m2 | Freq: Once | INTRAMUSCULAR | Status: AC
  Administered 2018-11-14: 2.5 mg via SUBCUTANEOUS
  Filled 2018-11-14: qty 1

## 2018-11-14 MED ORDER — SODIUM CHLORIDE 0.9 % IV SOLN
INTRAVENOUS | Status: DC
  Administered 2018-11-14: 14:00:00 via INTRAVENOUS

## 2018-11-14 NOTE — Progress Notes (Signed)
Treatment given per orders. Patient tolerated it well without problems. Vitals stable and discharged home from clinic ambulatory. Follow up as scheduled.  

## 2018-11-14 NOTE — Progress Notes (Signed)
Reviewed Potassium 2.5 for lab results today with orders for Potassium 74meq IV and Potassium 59meq by mouth every hour x 2 verbal order Dr. Delton Coombes.   Patient denied any symptoms for low potassium.  No s/s of distress noted.  Resting with family at side.

## 2018-11-14 NOTE — Progress Notes (Signed)
K+ 2.5 reported from lab. Dr. Delton Coombes notified. Report received at 1pm and given to [place].

## 2018-11-18 ENCOUNTER — Other Ambulatory Visit (HOSPITAL_COMMUNITY): Payer: Self-pay

## 2018-11-18 DIAGNOSIS — D649 Anemia, unspecified: Secondary | ICD-10-CM

## 2018-11-18 DIAGNOSIS — E876 Hypokalemia: Secondary | ICD-10-CM

## 2018-11-18 DIAGNOSIS — E538 Deficiency of other specified B group vitamins: Secondary | ICD-10-CM

## 2018-11-18 DIAGNOSIS — Z5111 Encounter for antineoplastic chemotherapy: Secondary | ICD-10-CM

## 2018-11-18 DIAGNOSIS — C9000 Multiple myeloma not having achieved remission: Secondary | ICD-10-CM

## 2018-11-28 ENCOUNTER — Inpatient Hospital Stay (HOSPITAL_BASED_OUTPATIENT_CLINIC_OR_DEPARTMENT_OTHER): Payer: Medicare Other | Admitting: Hematology

## 2018-11-28 ENCOUNTER — Other Ambulatory Visit: Payer: Self-pay

## 2018-11-28 ENCOUNTER — Inpatient Hospital Stay (HOSPITAL_COMMUNITY): Payer: Medicare Other

## 2018-11-28 ENCOUNTER — Inpatient Hospital Stay (HOSPITAL_COMMUNITY): Payer: Medicare Other | Attending: Hematology

## 2018-11-28 ENCOUNTER — Encounter (HOSPITAL_COMMUNITY): Payer: Self-pay | Admitting: Hematology

## 2018-11-28 VITALS — BP 108/46 | HR 49 | Temp 98.5°F | Resp 18 | Wt 150.1 lb

## 2018-11-28 DIAGNOSIS — Z5111 Encounter for antineoplastic chemotherapy: Secondary | ICD-10-CM

## 2018-11-28 DIAGNOSIS — Z79899 Other long term (current) drug therapy: Secondary | ICD-10-CM | POA: Diagnosis not present

## 2018-11-28 DIAGNOSIS — C9000 Multiple myeloma not having achieved remission: Secondary | ICD-10-CM | POA: Diagnosis present

## 2018-11-28 DIAGNOSIS — D649 Anemia, unspecified: Secondary | ICD-10-CM

## 2018-11-28 DIAGNOSIS — E876 Hypokalemia: Secondary | ICD-10-CM

## 2018-11-28 DIAGNOSIS — D631 Anemia in chronic kidney disease: Secondary | ICD-10-CM | POA: Diagnosis not present

## 2018-11-28 DIAGNOSIS — E538 Deficiency of other specified B group vitamins: Secondary | ICD-10-CM

## 2018-11-28 DIAGNOSIS — N189 Chronic kidney disease, unspecified: Secondary | ICD-10-CM | POA: Diagnosis present

## 2018-11-28 DIAGNOSIS — Z5112 Encounter for antineoplastic immunotherapy: Secondary | ICD-10-CM | POA: Diagnosis not present

## 2018-11-28 LAB — CBC WITH DIFFERENTIAL/PLATELET
ABS IMMATURE GRANULOCYTES: 0.02 10*3/uL (ref 0.00–0.07)
Basophils Absolute: 0 10*3/uL (ref 0.0–0.1)
Basophils Relative: 1 %
Eosinophils Absolute: 0.1 10*3/uL (ref 0.0–0.5)
Eosinophils Relative: 2 %
HCT: 25.5 % — ABNORMAL LOW (ref 36.0–46.0)
Hemoglobin: 7.8 g/dL — ABNORMAL LOW (ref 12.0–15.0)
Immature Granulocytes: 1 %
LYMPHS PCT: 22 %
Lymphs Abs: 0.8 10*3/uL (ref 0.7–4.0)
MCH: 32.4 pg (ref 26.0–34.0)
MCHC: 30.6 g/dL (ref 30.0–36.0)
MCV: 105.8 fL — AB (ref 80.0–100.0)
MONO ABS: 0.2 10*3/uL (ref 0.1–1.0)
MONOS PCT: 7 %
NEUTROS ABS: 2.5 10*3/uL (ref 1.7–7.7)
Neutrophils Relative %: 67 %
Platelets: 173 10*3/uL (ref 150–400)
RBC: 2.41 MIL/uL — ABNORMAL LOW (ref 3.87–5.11)
RDW: 17.5 % — ABNORMAL HIGH (ref 11.5–15.5)
WBC: 3.7 10*3/uL — ABNORMAL LOW (ref 4.0–10.5)
nRBC: 0 % (ref 0.0–0.2)

## 2018-11-28 LAB — COMPREHENSIVE METABOLIC PANEL
ALT: 8 U/L (ref 0–44)
ANION GAP: 8 (ref 5–15)
AST: 11 U/L — ABNORMAL LOW (ref 15–41)
Albumin: 3.6 g/dL (ref 3.5–5.0)
Alkaline Phosphatase: 44 U/L (ref 38–126)
BUN: 13 mg/dL (ref 8–23)
CO2: 26 mmol/L (ref 22–32)
Calcium: 8.8 mg/dL — ABNORMAL LOW (ref 8.9–10.3)
Chloride: 108 mmol/L (ref 98–111)
Creatinine, Ser: 1.1 mg/dL — ABNORMAL HIGH (ref 0.44–1.00)
GFR calc Af Amer: 56 mL/min — ABNORMAL LOW (ref >60)
GFR calc non Af Amer: 48 mL/min — ABNORMAL LOW (ref >60)
Glucose, Bld: 129 mg/dL — ABNORMAL HIGH (ref 70–99)
POTASSIUM: 3 mmol/L — AB (ref 3.5–5.1)
Sodium: 142 mmol/L (ref 135–145)
Total Bilirubin: 0.8 mg/dL (ref 0.3–1.2)
Total Protein: 6.8 g/dL (ref 6.5–8.1)

## 2018-11-28 LAB — LACTATE DEHYDROGENASE: LDH: 133 U/L (ref 98–192)

## 2018-11-28 MED ORDER — POTASSIUM CHLORIDE CRYS ER 20 MEQ PO TBCR: 20.0000 meq | EXTENDED_RELEASE_TABLET | Freq: Three times a day (TID) | ORAL | 3 refills | Status: DC

## 2018-11-28 MED ORDER — BORTEZOMIB CHEMO SQ INJECTION 3.5 MG (2.5MG/ML)
1.3000 mg/m2 | Freq: Once | INTRAMUSCULAR | Status: AC
  Administered 2018-11-28: 2.5 mg via SUBCUTANEOUS
  Filled 2018-11-28: qty 1

## 2018-11-28 MED ORDER — PROCHLORPERAZINE MALEATE 10 MG PO TABS
10.0000 mg | ORAL_TABLET | Freq: Once | ORAL | Status: AC
  Administered 2018-11-28: 10 mg via ORAL
  Filled 2018-11-28: qty 1

## 2018-11-28 NOTE — Progress Notes (Signed)
[place], [place] [address]   CLINIC:  Medical Oncology/Hematology  PCP:  Vesta Mixer [address] [place] [address] [phone]   REASON FOR VISIT: Follow-up for multiple myeloma  CURRENT THERAPY: Valcade, Revlimd, and dexamethasone every 3 weeks  BRIEF ONCOLOGIC HISTORY:    Multiple myeloma not having achieved remission ([place])   01/20/2018 Initial Diagnosis    Multiple myeloma not having achieved remission ([place])    01/23/2018 -  Chemotherapy    The patient had bortezomib SQ (VELCADE) chemo injection 2.5 mg, 1.3 mg/m2 = 2.5 mg, Subcutaneous,  Once, 12 of 12 cycles Administration: 2.5 mg (01/26/2018), 2.5 mg (02/02/2018), 2.5 mg (02/09/2018), 2.5 mg (02/16/2018), 2.5 mg (02/23/2018), 2.5 mg (03/02/2018), 2.5 mg (03/09/2018), 2.5 mg (03/30/2018), 2.5 mg (04/06/2018), 2.5 mg (04/13/2018), 2.5 mg (04/21/2018), 2.5 mg (05/06/2018), 2.5 mg (05/11/2018), 2.5 mg (05/20/2018), 2.5 mg (05/27/2018), 2.5 mg (06/10/2018), 2.5 mg (06/17/2018), 2.5 mg (06/24/2018), 2.5 mg (07/08/2018), 2.5 mg (07/15/2018), 2.5 mg (07/22/2018), 2.5 mg (08/05/2018), 2.5 mg (08/12/2018), 2.5 mg (09/02/2018), 2.5 mg (09/09/2018), 2.5 mg (09/16/2018), 2.5 mg (09/30/2018), 2.5 mg (10/07/2018), 2.5 mg (10/14/2018), 2.5 mg (10/31/2018), 2.5 mg (11/07/2018), 2.5 mg (11/14/2018)  for chemotherapy treatment.       INTERVAL HISTORY:  Ms. Gladd 77 y.o. female returns for routine follow-up for multiple myeloma. She is here today with her daughter. She is tolerating treatment well. She had one episode of hypoglycemia and had to go to the ER last week. She has mild numbness and tingling in her toes however it is stable at this time. She denies any new pains. Denies any nausea, vomiting, or diarrhea. Denies any bleeding or easy bruising. Denies any fevers or recent infections. She reports her appetite at 100% and her energy level at 50%.    REVIEW OF SYSTEMS:  Review of Systems  Constitutional:  Positive for fatigue.  Cardiovascular: Positive for leg swelling.  Neurological: Positive for numbness.  Hematological: Bruises/bleeds easily.  All other systems reviewed and are negative.    PAST MEDICAL/SURGICAL HISTORY:  Past Medical History:  Diagnosis Date  . Breast cancer ([facility])    left breast/ 2008/ surg/ rad tx  . Coronary artery disease   . Diabetes mellitus    Past Surgical History:  Procedure Laterality Date  . ABDOMINAL HYSTERECTOMY    . BREAST SURGERY       SOCIAL HISTORY:  Social History   Socioeconomic History  . Marital status: Divorced    Spouse name: Not on file  . Number of children: Not on file  . Years of education: Not on file  . Highest education level: Not on file  Occupational History  . Not on file  Social Needs  . Financial resource strain: Not on file  . Food insecurity:    Worry: Not on file    Inability: Not on file  . Transportation needs:    Medical: Not on file    Non-medical: Not on file  Tobacco Use  . Smoking status: Never Smoker  . Smokeless tobacco: Never Used  Substance and Sexual Activity  . Alcohol use: No  . Drug use: No  . Sexual activity: Yes    Birth control/protection: Surgical  Lifestyle  . Physical activity:    Days per week: Not on file    Minutes per session: Not on file  . Stress: Not on file  Relationships  . Social connections:    Talks on phone:  Not on file    Gets together: Not on file    Attends religious service: Not on file    Active member of club or organization: Not on file    Attends meetings of clubs or organizations: Not on file    Relationship status: Not on file  . Intimate partner violence:    Fear of current or ex partner: Not on file    Emotionally abused: Not on file    Physically abused: Not on file    Forced sexual activity: Not on file  Other Topics Concern  . Not on file  Social History Narrative  . Not on file    FAMILY HISTORY:  Family History  Problem Relation Age of  Onset  . Obesity Sister     CURRENT MEDICATIONS:  Outpatient Encounter Medications as of 11/28/2018  Medication Sig  . acetaminophen (TYLENOL) 500 MG tablet Take 500 mg by mouth every 6 (six) hours as needed for mild pain or moderate pain.  Marland Kitchen acyclovir (ZOVIRAX) 400 MG tablet Take 1 tablet (400 mg total) by mouth 2 (two) times daily.  Marland Kitchen aspirin 81 MG tablet Take 81 mg by mouth daily.    . bortezomib IV (VELCADE) 3.5 MG injection Inject into the vein once. weekly  . cholecalciferol (VITAMIN D) 1000 units tablet Take 1,000 Units by mouth daily.  . Denosumab (XGEVA [place]) Inject into the skin. Every 28 days  . dexamethasone (DECADRON) 4 MG tablet Take 10 tablets (40 mg) on days 1, 8, and 15 of chemo. Repeat every 21 days.  Marland Kitchen glipiZIDE (GLUCOTROL) 5 MG tablet Take 5 mg by mouth daily before breakfast.   . lenalidomide (REVLIMID) 20 MG capsule Take one capsule daily on days 1-14 every 21 days.  Marland Kitchen lisinopril-hydrochlorothiazide (PRINZIDE,ZESTORETIC) 20-25 MG tablet Take 1 tablet by mouth daily.   . metFORMIN (GLUCOPHAGE) 1000 MG tablet Take 1,000 mg by mouth 2 times daily at 12 noon and 4 pm.    . ondansetron (ZOFRAN) 8 MG tablet Take 1 tablet (8 mg total) by mouth 2 (two) times daily as needed (Nausea or vomiting).  . potassium chloride SA (K-DUR,KLOR-CON) 20 MEQ tablet Take 1 tablet (20 mEq total) by mouth 3 (three) times daily.  . prochlorperazine (COMPAZINE) 10 MG tablet Take 1 tablet (10 mg total) by mouth every 6 (six) hours as needed (Nausea or vomiting).  . [DISCONTINUED] potassium chloride SA (K-DUR,KLOR-CON) 20 MEQ tablet Take 1 tablet (20 mEq total) by mouth 2 (two) times daily.   No facility-administered encounter medications on file as of 11/28/2018.     ALLERGIES:  Allergies  Allergen Reactions  . Motrin [Ibuprofen] Rash     PHYSICAL EXAM:  ECOG Performance status: 1  Vitals:   11/28/18 1319  BP: (!) 108/46  Pulse: (!) 49  Resp: 18  Temp: 98.5 F (36.9 C)  SpO2: 100%    Filed Weights   11/28/18 1319  Weight: 150 lb 1.6 oz (68.1 kg)    Physical Exam  Constitutional: She is oriented to person, place, and time. She appears well-developed and well-nourished.  Musculoskeletal: Normal range of motion.  Neurological: She is alert and oriented to person, place, and time.  Skin: Skin is warm and dry.  Psychiatric: She has a normal mood and affect. Her behavior is normal. Judgment and thought content normal.     LABORATORY DATA:  I have reviewed the labs as listed.  CBC    Component Value Date/Time   WBC 3.7 (L) 11/28/2018  1242   RBC 2.41 (L) 11/28/2018 1242   HGB 7.8 (L) 11/28/2018 1242   HCT 25.5 (L) 11/28/2018 1242   PLT 173 11/28/2018 1242   MCV 105.8 (H) 11/28/2018 1242   MCH 32.4 11/28/2018 1242   MCHC 30.6 11/28/2018 1242   RDW 17.5 (H) 11/28/2018 1242   LYMPHSABS 0.8 11/28/2018 1242   MONOABS 0.2 11/28/2018 1242   EOSABS 0.1 11/28/2018 1242   BASOSABS 0.0 11/28/2018 1242   CMP Latest Ref Rng & Units 11/28/2018 11/14/2018 11/07/2018  Glucose 70 - 99 mg/dL 129(H) 103(H) 82  BUN 8 - 23 mg/dL _0 Creatinine 0.44 - 1.00 mg/dL 1.10(H) 1.17(H) 1.13(H)  Sodium 135 - 145 mmol/L 142 140 139  Potassium 3.5 - 5.1 mmol/L 3.0(L) 2.5(LL) 3.2(L)  Chloride 98 - 111 mmol/L 108 105 107  CO2 22 - 32 mmol/L _1 Calcium 8.9 - 10.3 mg/dL 8.8(L) 8.6(L) 8.5(L)  Total Protein 6.5 - 8.1 g/dL 6.8 6.8 6.6  Total Bilirubin 0.3 - 1.2 mg/dL 0.8 0.8 0.8  Alkaline Phos 38 - 126 U/L 44 51 48  AST 15 - 41 U/L 11(L) 10(L) 12(L)  ALT 0 - 44 U/L _2 I have reviewed Francene Finders, NP's note and agree with the documentation.  I personally performed a face-to-face visit, made revisions and my assessment and plan is as follows.    ASSESSMENT & PLAN:   Multiple myeloma not having achieved remission ([place]) 1.  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: -Bone marrow biopsy on 01/03/2018 with 60% plasma cells, FISH panel with no  abnormalities, chromosome analysis showing hyperdiploidy with gains of chromosomes 2, 3, 4, 7, 10, 15, beta-2 microglobulin 3.2, LDH normal, 0.9 g/dL of M spike at diagnosis, free light chain ratio of 114, Kappa Light chain of 1796 -Skeletal survey on 02/16/2018 showing subtle patchy areas of osteopenia of the thoracolumbar spine and possibly distal right clavicle which may reflect subtle changes of multiple myeloma - RVD (Velcade on days 1, 8 and 15, Revlimid 25 on days 1-21, dexamethasone 10 pills on day of Velcade every 28 days) cycle 1 started on 01/26/2018, cycle 4 on 04/13/2018, she is asynchronous with Revlimid and Velcade.  We have held her Revlimid once when [place] dropped to 600 during cycle 4. - Her Revlimid was dose reduced to 20 mg because of cytopenias.  She is tolerating it very well.  She will finish her Revlimid on 09/08/2018. - I have reviewed myeloma panel from 10/07/2018.  SPEP showed 0.5 g of monoclonal protein.  However serum immunofixation was normal.  Free light chain ratio was normal at 1.45. -We have sent another myeloma panel from today which is pending.  We reviewed blood work from today which showed hemoglobin of 7.8.  Patient is asymptomatic.  We will hold off on any transfusion today.  We will recheck her counts next week.  She may proceed with Velcade today.  She is continuing Revlimid 3 weeks on 1 week off.  She was reinforced to take dexamethasone 40 mg once a week along with Velcade. - If she gets a complete response, we will plan to discontinue Velcade and to keep her on Revlimid maintenance. -I will see her back in 6 weeks.  2.  Bone strengthening: She will continue denosumab.  She will continue calcium and vitamin D twice daily.  3.  Hypokalemia: She is taking potassium 20 mg twice daily.  Her potassium has been consistently low her last few times.  Hence I have told her to increase it to 20 mEq 3 times a day.      Orders placed this encounter:  Orders Placed This  Encounter  Procedures  . Lactate dehydrogenase  . Protein electrophoresis, serum  . Immunofixation electrophoresis  . Kappa/lambda light chains  . Lactate dehydrogenase  . Protein electrophoresis, serum  . Immunofixation electrophoresis  . Kappa/lambda light chains  . IgG, IgA, IgM  . CBC with Differential/Platelet  . Comprehensive metabolic panel  . CBC with Differential/Platelet  . Comprehensive metabolic panel  . Draw extra clot tube      Derek Jack, MD [place] [phone]

## 2018-11-28 NOTE — Progress Notes (Signed)
Brittany Archer presents today for injection per the provider's orders.  Velcade administration without incident; see MAR for injection details.  Patient tolerated procedure well and without incident.  No questions or complaints noted at this time. Discharged ambulatory in c/o family.

## 2018-11-28 NOTE — Patient Instructions (Signed)
[facility] at [facility]  Discharge Instructions:  You saw Dr. Katragadda today. _______________________________________________________________  Thank you for choosing [facility] at [facility] to provide your oncology and hematology care.  To afford each patient quality time with our providers, please arrive at least 15 minutes before your scheduled appointment.  You need to re-schedule your appointment if you arrive 10 or more minutes late.  We strive to give you quality time with our providers, and arriving late affects you and other patients whose appointments are after yours.  Also, if you no show three or more times for appointments you may be dismissed from the clinic.  Again, thank you for choosing [facility] at [facility]. Our hope is that these requests will allow you access to exceptional care and in a timely manner. _______________________________________________________________  If you have questions after your visit, please contact our office at [phone] between the hours of 8:30 a.m. and 5:00 p.m. Voicemails left after 4:30 p.m. will not be returned until the following business day. _______________________________________________________________  For prescription refill requests, have your pharmacy contact our office. _______________________________________________________________  Recommendations made by the consultant and any test results will be sent to your referring physician. _______________________________________________________________ 

## 2018-11-28 NOTE — Assessment & Plan Note (Signed)
1.  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: -Bone marrow biopsy on 01/03/2018 with 60% plasma cells, FISH panel with no abnormalities, chromosome analysis showing hyperdiploidy with gains of chromosomes 2, 3, 4, 7, 10, 15, beta-2 microglobulin 3.2, LDH normal, 0.9 g/dL of M spike at diagnosis, free light chain ratio of 114, Kappa Light chain of 1796 -Skeletal survey on 02/16/2018 showing subtle patchy areas of osteopenia of the thoracolumbar spine and possibly distal right clavicle which may reflect subtle changes of multiple myeloma - RVD (Velcade on days 1, 8 and 15, Revlimid 25 on days 1-21, dexamethasone 10 pills on day of Velcade every 28 days) cycle 1 started on 01/26/2018, cycle 4 on 04/13/2018, she is asynchronous with Revlimid and Velcade.  We have held her Revlimid once when [place] dropped to 600 during cycle 4. - Her Revlimid was dose reduced to 20 mg because of cytopenias.  She is tolerating it very well.  She will finish her Revlimid on 09/08/2018. - I have reviewed myeloma panel from 10/07/2018.  SPEP showed 0.5 g of monoclonal protein.  However serum immunofixation was normal.  Free light chain ratio was normal at 1.45. -We have sent another myeloma panel from today which is pending.  We reviewed blood work from today which showed hemoglobin of 7.8.  Patient is asymptomatic.  We will hold off on any transfusion today.  We will recheck her counts next week.  She may proceed with Velcade today.  She is continuing Revlimid 3 weeks on 1 week off.  She was reinforced to take dexamethasone 40 mg once a week along with Velcade. - If she gets a complete response, we will plan to discontinue Velcade and to keep her on Revlimid maintenance. -I will see her back in 6 weeks.  2.  Bone strengthening: She will continue denosumab.  She will continue calcium and vitamin D twice daily.  3.  Hypokalemia: She is taking potassium 20 mg twice daily.  Her potassium has been consistently low her  last few times.  Hence I have told her to increase it to 20 mEq 3 times a day.

## 2018-11-29 LAB — PROTEIN ELECTROPHORESIS, SERUM
A/G Ratio: 1.2 (ref 0.7–1.7)
Albumin ELP: 3.4 g/dL (ref 2.9–4.4)
Alpha-1-Globulin: 0.2 g/dL (ref 0.0–0.4)
Alpha-2-Globulin: 0.9 g/dL (ref 0.4–1.0)
Beta Globulin: 1 g/dL (ref 0.7–1.3)
Gamma Globulin: 0.8 g/dL (ref 0.4–1.8)
Globulin, Total: 2.9 g/dL (ref 2.2–3.9)
M-Spike, %: 0.4 g/dL — ABNORMAL HIGH
TOTAL PROTEIN ELP: 6.3 g/dL (ref 6.0–8.5)

## 2018-11-29 LAB — IGG, IGA, IGM
IGA: 316 mg/dL (ref 64–422)
IgG (Immunoglobin G), Serum: 882 mg/dL (ref 700–1600)
IgM (Immunoglobulin M), Srm: 50 mg/dL (ref 26–217)

## 2018-11-29 LAB — KAPPA/LAMBDA LIGHT CHAINS
Kappa free light chain: 46 mg/L — ABNORMAL HIGH (ref 3.3–19.4)
Kappa, lambda light chain ratio: 1.54 (ref 0.26–1.65)
Lambda free light chains: 29.8 mg/L — ABNORMAL HIGH (ref 5.7–26.3)

## 2018-11-29 LAB — IMMUNOFIXATION ELECTROPHORESIS: Total Protein ELP: UNDETERMINED g/dL

## 2018-12-01 ENCOUNTER — Other Ambulatory Visit (HOSPITAL_COMMUNITY): Payer: Self-pay | Admitting: *Deleted

## 2018-12-01 DIAGNOSIS — C9000 Multiple myeloma not having achieved remission: Secondary | ICD-10-CM

## 2018-12-05 ENCOUNTER — Inpatient Hospital Stay (HOSPITAL_COMMUNITY): Payer: Medicare Other

## 2018-12-05 ENCOUNTER — Encounter (HOSPITAL_COMMUNITY): Payer: Self-pay

## 2018-12-05 ENCOUNTER — Other Ambulatory Visit: Payer: Self-pay

## 2018-12-05 VITALS — BP 109/43 | HR 56 | Temp 97.6°F | Resp 18 | Wt 146.4 lb

## 2018-12-05 DIAGNOSIS — E538 Deficiency of other specified B group vitamins: Secondary | ICD-10-CM

## 2018-12-05 DIAGNOSIS — C9000 Multiple myeloma not having achieved remission: Secondary | ICD-10-CM

## 2018-12-05 DIAGNOSIS — Z5112 Encounter for antineoplastic immunotherapy: Secondary | ICD-10-CM | POA: Diagnosis not present

## 2018-12-05 LAB — CBC WITH DIFFERENTIAL/PLATELET
Abs Immature Granulocytes: 0.02 10*3/uL (ref 0.00–0.07)
Basophils Absolute: 0 10*3/uL (ref 0.0–0.1)
Basophils Relative: 1 %
Eosinophils Absolute: 0.1 10*3/uL (ref 0.0–0.5)
Eosinophils Relative: 2 %
HCT: 24.8 % — ABNORMAL LOW (ref 36.0–46.0)
HEMOGLOBIN: 7.8 g/dL — AB (ref 12.0–15.0)
Immature Granulocytes: 1 %
Lymphocytes Relative: 17 %
Lymphs Abs: 0.8 10*3/uL (ref 0.7–4.0)
MCH: 32.4 pg (ref 26.0–34.0)
MCHC: 31.5 g/dL (ref 30.0–36.0)
MCV: 102.9 fL — ABNORMAL HIGH (ref 80.0–100.0)
Monocytes Absolute: 0.5 10*3/uL (ref 0.1–1.0)
Monocytes Relative: 13 %
Neutro Abs: 2.9 10*3/uL (ref 1.7–7.7)
Neutrophils Relative %: 66 %
Platelets: 122 10*3/uL — ABNORMAL LOW (ref 150–400)
RBC: 2.41 MIL/uL — AB (ref 3.87–5.11)
RDW: 17.2 % — ABNORMAL HIGH (ref 11.5–15.5)
WBC: 4.3 10*3/uL (ref 4.0–10.5)
nRBC: 0 % (ref 0.0–0.2)

## 2018-12-05 LAB — COMPREHENSIVE METABOLIC PANEL
ALT: 9 U/L (ref 0–44)
AST: 9 U/L — ABNORMAL LOW (ref 15–41)
Albumin: 3.6 g/dL (ref 3.5–5.0)
Alkaline Phosphatase: 47 U/L (ref 38–126)
Anion gap: 9 (ref 5–15)
BUN: 23 mg/dL (ref 8–23)
CO2: 22 mmol/L (ref 22–32)
Calcium: 8.1 mg/dL — ABNORMAL LOW (ref 8.9–10.3)
Chloride: 107 mmol/L (ref 98–111)
Creatinine, Ser: 1.38 mg/dL — ABNORMAL HIGH (ref 0.44–1.00)
GFR calc Af Amer: 42 mL/min — ABNORMAL LOW (ref >60)
GFR calc non Af Amer: 37 mL/min — ABNORMAL LOW (ref >60)
Glucose, Bld: 114 mg/dL — ABNORMAL HIGH (ref 70–99)
Potassium: 3.2 mmol/L — ABNORMAL LOW (ref 3.5–5.1)
Sodium: 138 mmol/L (ref 135–145)
Total Bilirubin: 1 mg/dL (ref 0.3–1.2)
Total Protein: 6.5 g/dL (ref 6.5–8.1)

## 2018-12-05 MED ORDER — BORTEZOMIB CHEMO SQ INJECTION 3.5 MG (2.5MG/ML)
1.3000 mg/m2 | Freq: Once | INTRAMUSCULAR | Status: AC
  Administered 2018-12-05: 2.25 mg via SUBCUTANEOUS
  Filled 2018-12-05: qty 0.9

## 2018-12-05 MED ORDER — POTASSIUM CHLORIDE CRYS ER 20 MEQ PO TBCR
40.0000 meq | EXTENDED_RELEASE_TABLET | Freq: Once | ORAL | Status: AC
  Administered 2018-12-05: 40 meq via ORAL
  Filled 2018-12-05: qty 2

## 2018-12-05 MED ORDER — PROCHLORPERAZINE MALEATE 10 MG PO TABS
10.0000 mg | ORAL_TABLET | Freq: Once | ORAL | Status: AC
  Administered 2018-12-05: 10 mg via ORAL
  Filled 2018-12-05: qty 1

## 2018-12-05 MED ORDER — CYANOCOBALAMIN 1000 MCG/ML IJ SOLN
1000.0000 ug | Freq: Once | INTRAMUSCULAR | Status: AC
  Administered 2018-12-05: 1000 ug via INTRAMUSCULAR

## 2018-12-05 MED ORDER — CYANOCOBALAMIN 1000 MCG/ML IJ SOLN
INTRAMUSCULAR | Status: AC
  Filled 2018-12-05: qty 1

## 2018-12-05 MED ORDER — DENOSUMAB 120 MG/1.7ML ~~LOC~~ SOLN
120.0000 mg | Freq: Once | SUBCUTANEOUS | Status: AC
  Administered 2018-12-05: 120 mg via SUBCUTANEOUS
  Filled 2018-12-05: qty 1.7

## 2018-12-05 NOTE — Progress Notes (Signed)
Labs reviewed with Dr. Delton Coombes - he is aware of hgb 7.8, K+ of 3.2, and Ca++ 8.1. Okay to proceed with Velcade injection, as well as B12 and Xgeva today per MD. Order rec'd for K-Dur 40 mEq po.   Brittany Archer presents today for injection per the provider's orders.  Velcade, Xgeva, and B12 administrations without incident; see MAR for injection details.  Patient tolerated procedure well and without incident.  No questions or complaints noted at this time.  Discharged ambulatory in c/o family.

## 2018-12-09 LAB — IMMUNOFIXATION ELECTROPHORESIS
IgA: 302 mg/dL (ref 64–422)
IgG (Immunoglobin G), Serum: 847 mg/dL (ref 700–1600)
IgM (Immunoglobulin M), Srm: 52 mg/dL (ref 26–217)
Total Protein ELP: 6.1 g/dL (ref 6.0–8.5)

## 2018-12-12 ENCOUNTER — Inpatient Hospital Stay (HOSPITAL_COMMUNITY): Payer: Medicare Other

## 2018-12-12 ENCOUNTER — Encounter (HOSPITAL_COMMUNITY): Payer: Self-pay

## 2018-12-12 VITALS — BP 116/51 | HR 59 | Temp 98.0°F | Resp 18 | Wt 141.4 lb

## 2018-12-12 DIAGNOSIS — C9000 Multiple myeloma not having achieved remission: Secondary | ICD-10-CM

## 2018-12-12 DIAGNOSIS — Z5112 Encounter for antineoplastic immunotherapy: Secondary | ICD-10-CM | POA: Diagnosis not present

## 2018-12-12 LAB — CBC WITH DIFFERENTIAL/PLATELET
Abs Immature Granulocytes: 0.02 10*3/uL (ref 0.00–0.07)
Basophils Absolute: 0 10*3/uL (ref 0.0–0.1)
Basophils Relative: 0 %
EOS ABS: 0.1 10*3/uL (ref 0.0–0.5)
Eosinophils Relative: 2 %
HCT: 22.7 % — ABNORMAL LOW (ref 36.0–46.0)
Hemoglobin: 7.3 g/dL — ABNORMAL LOW (ref 12.0–15.0)
IMMATURE GRANULOCYTES: 1 %
Lymphocytes Relative: 25 %
Lymphs Abs: 0.7 10*3/uL (ref 0.7–4.0)
MCH: 33.2 pg (ref 26.0–34.0)
MCHC: 32.2 g/dL (ref 30.0–36.0)
MCV: 103.2 fL — ABNORMAL HIGH (ref 80.0–100.0)
Monocytes Absolute: 0.5 10*3/uL (ref 0.1–1.0)
Monocytes Relative: 18 %
NEUTROS PCT: 54 %
Neutro Abs: 1.6 10*3/uL — ABNORMAL LOW (ref 1.7–7.7)
Platelets: 120 10*3/uL — ABNORMAL LOW (ref 150–400)
RBC: 2.2 MIL/uL — ABNORMAL LOW (ref 3.87–5.11)
RDW: 17.1 % — AB (ref 11.5–15.5)
WBC: 3 10*3/uL — ABNORMAL LOW (ref 4.0–10.5)
nRBC: 0 % (ref 0.0–0.2)

## 2018-12-12 LAB — COMPREHENSIVE METABOLIC PANEL
ALT: 10 U/L (ref 0–44)
AST: 14 U/L — AB (ref 15–41)
Albumin: 3.8 g/dL (ref 3.5–5.0)
Alkaline Phosphatase: 42 U/L (ref 38–126)
Anion gap: 6 (ref 5–15)
BUN: 21 mg/dL (ref 8–23)
CO2: 21 mmol/L — ABNORMAL LOW (ref 22–32)
Calcium: 7.7 mg/dL — ABNORMAL LOW (ref 8.9–10.3)
Chloride: 112 mmol/L — ABNORMAL HIGH (ref 98–111)
Creatinine, Ser: 1.26 mg/dL — ABNORMAL HIGH (ref 0.44–1.00)
GFR calc Af Amer: 47 mL/min — ABNORMAL LOW (ref >60)
GFR, EST NON AFRICAN AMERICAN: 41 mL/min — AB (ref >60)
Glucose, Bld: 131 mg/dL — ABNORMAL HIGH (ref 70–99)
Potassium: 3 mmol/L — ABNORMAL LOW (ref 3.5–5.1)
Sodium: 139 mmol/L (ref 135–145)
Total Bilirubin: 2 mg/dL — ABNORMAL HIGH (ref 0.3–1.2)
Total Protein: 6.4 g/dL — ABNORMAL LOW (ref 6.5–8.1)

## 2018-12-12 LAB — PREPARE RBC (CROSSMATCH)

## 2018-12-12 MED ORDER — POTASSIUM CHLORIDE CRYS ER 20 MEQ PO TBCR
EXTENDED_RELEASE_TABLET | ORAL | Status: AC
  Filled 2018-12-12: qty 1

## 2018-12-12 MED ORDER — PROCHLORPERAZINE MALEATE 10 MG PO TABS
10.0000 mg | ORAL_TABLET | Freq: Once | ORAL | Status: AC
  Administered 2018-12-12: 10 mg via ORAL

## 2018-12-12 MED ORDER — PROCHLORPERAZINE MALEATE 10 MG PO TABS
ORAL_TABLET | ORAL | Status: AC
  Filled 2018-12-12: qty 1

## 2018-12-12 MED ORDER — BORTEZOMIB CHEMO SQ INJECTION 3.5 MG (2.5MG/ML)
1.3000 mg/m2 | Freq: Once | INTRAMUSCULAR | Status: AC
  Administered 2018-12-12: 2.25 mg via SUBCUTANEOUS
  Filled 2018-12-12: qty 0.9

## 2018-12-12 MED ORDER — POTASSIUM CHLORIDE CRYS ER 20 MEQ PO TBCR
40.0000 meq | EXTENDED_RELEASE_TABLET | Freq: Once | ORAL | Status: AC
  Administered 2018-12-12: 40 meq via ORAL
  Filled 2018-12-12: qty 2

## 2018-12-12 NOTE — Progress Notes (Signed)
Reviewed labs with Dr. Delton Coombes. VO received to give 1 unit of blood on Wed, 76meq of k+ today. VSS. Proceed with treatment per Dr. Delton Coombes.

## 2018-12-12 NOTE — Patient Instructions (Signed)
[facility] Discharge Instructions for Patients Receiving Chemotherapy   Beginning January 23rd 2017 lab work for the Cancer Center will be done in the  Main lab at [facility] on 1st floor. If you have a lab appointment with the Cancer Center please come in thru the  Main Entrance and check in at the main information desk   Today you received the following chemotherapy agents   To help prevent nausea and vomiting after your treatment, we encourage you to take your nausea medication     If you develop nausea and vomiting, or diarrhea that is not controlled by your medication, call the clinic.  The clinic phone number is [phone]. Office hours are Monday-Friday 8:30am-5:00pm.  BELOW ARE SYMPTOMS THAT SHOULD BE REPORTED IMMEDIATELY:  *FEVER GREATER THAN 101.0 F  *CHILLS WITH OR WITHOUT FEVER  NAUSEA AND VOMITING THAT IS NOT CONTROLLED WITH YOUR NAUSEA MEDICATION  *UNUSUAL SHORTNESS OF BREATH  *UNUSUAL BRUISING OR BLEEDING  TENDERNESS IN MOUTH AND THROAT WITH OR WITHOUT PRESENCE OF ULCERS  *URINARY PROBLEMS  *BOWEL PROBLEMS  UNUSUAL RASH Items with * indicate a potential emergency and should be followed up as soon as possible. If you have an emergency after office hours please contact your primary care physician or go to the nearest emergency department.  Please call the clinic during office hours if you have any questions or concerns.   You may also contact the Patient Navigator at [phone] should you have any questions or need assistance in obtaining follow up care.      Resources For Cancer Patients and their Caregivers ? American Cancer Society: Can assist with transportation, wigs, general needs, runs Look Good Feel Better.        1-888-227-6333 ? Cancer Care: Provides financial assistance, online support groups, medication/co-pay assistance.  1-800-813-HOPE (4673) ? [facility] Assists [place] cancer  patients and their families through emotional , educational and financial support.  336-427-4357 ? [place] DSS Where to apply for food stamps, Medicaid and utility assistance. 336-342-1394 ? [facility]: Transportation to medical appointments. 336-347-2287 ? Social Security Administration: May apply for disability if have a Stage IV cancer. 336-342-7796 1-800-772-1213 ? [place] Aging, Disability and Transit Services: Assists with nutrition, care and transit needs. 336-349-2343         

## 2018-12-14 ENCOUNTER — Encounter (HOSPITAL_COMMUNITY): Payer: Self-pay

## 2018-12-14 ENCOUNTER — Inpatient Hospital Stay (HOSPITAL_COMMUNITY): Payer: Medicare Other

## 2018-12-14 DIAGNOSIS — Z5112 Encounter for antineoplastic immunotherapy: Secondary | ICD-10-CM | POA: Diagnosis not present

## 2018-12-14 DIAGNOSIS — C9000 Multiple myeloma not having achieved remission: Secondary | ICD-10-CM

## 2018-12-14 MED ORDER — DIPHENHYDRAMINE HCL 25 MG PO CAPS
25.0000 mg | ORAL_CAPSULE | Freq: Once | ORAL | Status: AC
  Administered 2018-12-14: 25 mg via ORAL

## 2018-12-14 MED ORDER — DIPHENHYDRAMINE HCL 50 MG/ML IJ SOLN
25.0000 mg | Freq: Once | INTRAMUSCULAR | Status: DC
  Filled 2018-12-14: qty 1

## 2018-12-14 MED ORDER — DIPHENHYDRAMINE HCL 25 MG PO CAPS
ORAL_CAPSULE | ORAL | Status: AC
  Filled 2018-12-14: qty 1

## 2018-12-14 MED ORDER — ACETAMINOPHEN 325 MG PO TABS
650.0000 mg | ORAL_TABLET | Freq: Once | ORAL | Status: AC
  Administered 2018-12-14: 650 mg via ORAL
  Filled 2018-12-14: qty 2

## 2018-12-14 MED ORDER — SODIUM CHLORIDE 0.9% IV SOLUTION
250.0000 mL | Freq: Once | INTRAVENOUS | Status: AC
  Administered 2018-12-14: 250 mL via INTRAVENOUS

## 2018-12-14 NOTE — Patient Instructions (Signed)
[facility] at [facility] Discharge Instructions  Received 1 unit of blood today. Follow-up as scheduled. Call clinic for any questions or concerns   Thank you for choosing [facility] at [facility] to provide your oncology and hematology care.  To afford each patient quality time with our provider, please arrive at least 15 minutes before your scheduled appointment time.   If you have a lab appointment with the Cancer Center please come in thru the  Main Entrance and check in at the main information desk  You need to re-schedule your appointment should you arrive 10 or more minutes late.  We strive to give you quality time with our providers, and arriving late affects you and other patients whose appointments are after yours.  Also, if you no show three or more times for appointments you may be dismissed from the clinic at the providers discretion.     Again, thank you for choosing [facility].  Our hope is that these requests will decrease the amount of time that you wait before being seen by our physicians.       _____________________________________________________________  Should you have questions after your visit to [facility], please contact our office at [phone] between the hours of 8:00 a.m. and 4:30 p.m.  Voicemails left after 4:00 p.m. will not be returned until the following business day.  For prescription refill requests, have your pharmacy contact our office and allow 72 hours.    Cancer Center Support Programs:   > Cancer Support Group  2nd Tuesday of the month 1pm-2pm, Journey Room   

## 2018-12-14 NOTE — Progress Notes (Signed)
Brittany Archer tolerated blood transfusion well without complaints or incident. VSS upon discharge. Pt discharged self ambulatory in satisfactory condition accompanied by family member

## 2018-12-15 LAB — BPAM RBC
Blood Product Expiration Date: 202001102359
ISSUE DATE / TIME: 201912181234
Unit Type and Rh: 5100

## 2018-12-15 LAB — TYPE AND SCREEN
ABO/RH(D): O POS
Antibody Screen: NEGATIVE
Unit division: 0

## 2018-12-26 ENCOUNTER — Encounter (HOSPITAL_COMMUNITY): Payer: Self-pay | Admitting: *Deleted

## 2018-12-26 ENCOUNTER — Other Ambulatory Visit: Payer: Self-pay

## 2018-12-26 ENCOUNTER — Inpatient Hospital Stay (HOSPITAL_COMMUNITY): Payer: Medicare Other

## 2018-12-26 ENCOUNTER — Encounter (HOSPITAL_COMMUNITY): Payer: Self-pay

## 2018-12-26 ENCOUNTER — Other Ambulatory Visit (HOSPITAL_COMMUNITY): Payer: Self-pay | Admitting: *Deleted

## 2018-12-26 VITALS — BP 108/44 | HR 70 | Temp 98.6°F | Wt 152.1 lb

## 2018-12-26 DIAGNOSIS — C9000 Multiple myeloma not having achieved remission: Secondary | ICD-10-CM

## 2018-12-26 DIAGNOSIS — E538 Deficiency of other specified B group vitamins: Secondary | ICD-10-CM

## 2018-12-26 DIAGNOSIS — Z5112 Encounter for antineoplastic immunotherapy: Secondary | ICD-10-CM | POA: Diagnosis not present

## 2018-12-26 LAB — CBC WITH DIFFERENTIAL/PLATELET
Abs Immature Granulocytes: 0.01 10*3/uL (ref 0.00–0.07)
Basophils Absolute: 0.1 10*3/uL (ref 0.0–0.1)
Basophils Relative: 1 %
EOS ABS: 0.1 10*3/uL (ref 0.0–0.5)
Eosinophils Relative: 3 %
HCT: 24.1 % — ABNORMAL LOW (ref 36.0–46.0)
Hemoglobin: 7.7 g/dL — ABNORMAL LOW (ref 12.0–15.0)
Immature Granulocytes: 0 %
Lymphocytes Relative: 19 %
Lymphs Abs: 0.9 10*3/uL (ref 0.7–4.0)
MCH: 33.3 pg (ref 26.0–34.0)
MCHC: 32 g/dL (ref 30.0–36.0)
MCV: 104.3 fL — ABNORMAL HIGH (ref 80.0–100.0)
MONO ABS: 0.3 10*3/uL (ref 0.1–1.0)
Monocytes Relative: 7 %
Neutro Abs: 3.1 10*3/uL (ref 1.7–7.7)
Neutrophils Relative %: 70 %
Platelets: 130 10*3/uL — ABNORMAL LOW (ref 150–400)
RBC: 2.31 MIL/uL — ABNORMAL LOW (ref 3.87–5.11)
RDW: 17.4 % — ABNORMAL HIGH (ref 11.5–15.5)
WBC: 4.4 10*3/uL (ref 4.0–10.5)
nRBC: 0 % (ref 0.0–0.2)

## 2018-12-26 LAB — COMPREHENSIVE METABOLIC PANEL
ALT: 7 U/L (ref 0–44)
ANION GAP: 7 (ref 5–15)
AST: 11 U/L — ABNORMAL LOW (ref 15–41)
Albumin: 3.5 g/dL (ref 3.5–5.0)
Alkaline Phosphatase: 46 U/L (ref 38–126)
BUN: 21 mg/dL (ref 8–23)
CO2: 22 mmol/L (ref 22–32)
Calcium: 8.1 mg/dL — ABNORMAL LOW (ref 8.9–10.3)
Chloride: 109 mmol/L (ref 98–111)
Creatinine, Ser: 1.23 mg/dL — ABNORMAL HIGH (ref 0.44–1.00)
GFR calc Af Amer: 49 mL/min — ABNORMAL LOW (ref >60)
GFR calc non Af Amer: 42 mL/min — ABNORMAL LOW (ref >60)
Glucose, Bld: 86 mg/dL (ref 70–99)
POTASSIUM: 3.4 mmol/L — AB (ref 3.5–5.1)
Sodium: 138 mmol/L (ref 135–145)
Total Bilirubin: 0.6 mg/dL (ref 0.3–1.2)
Total Protein: 6.1 g/dL — ABNORMAL LOW (ref 6.5–8.1)

## 2018-12-26 MED ORDER — BORTEZOMIB CHEMO SQ INJECTION 3.5 MG (2.5MG/ML)
1.3000 mg/m2 | Freq: Once | INTRAMUSCULAR | Status: AC
  Administered 2018-12-26: 2.25 mg via SUBCUTANEOUS
  Filled 2018-12-26: qty 0.9

## 2018-12-26 MED ORDER — PROCHLORPERAZINE MALEATE 10 MG PO TABS: 10.0000 mg | ORAL_TABLET | Freq: Once | ORAL | Status: DC

## 2018-12-26 MED ORDER — EPOETIN ALFA 20000 UNIT/ML IJ SOLN
20000.0000 [IU] | Freq: Once | INTRAMUSCULAR | Status: AC
  Administered 2018-12-26: 20000 [IU] via SUBCUTANEOUS

## 2018-12-26 NOTE — Patient Instructions (Signed)
[facility] Discharge Instructions for Patients Receiving Chemotherapy  Today you received the following chemotherapy agents   To help prevent nausea and vomiting after your treatment, we encourage you to take your nausea medication   If you develop nausea and vomiting that is not controlled by your nausea medication, call the clinic.   BELOW ARE SYMPTOMS THAT SHOULD BE REPORTED IMMEDIATELY:  *FEVER GREATER THAN 100.5 F  *CHILLS WITH OR WITHOUT FEVER  NAUSEA AND VOMITING THAT IS NOT CONTROLLED WITH YOUR NAUSEA MEDICATION  *UNUSUAL SHORTNESS OF BREATH  *UNUSUAL BRUISING OR BLEEDING  TENDERNESS IN MOUTH AND THROAT WITH OR WITHOUT PRESENCE OF ULCERS  *URINARY PROBLEMS  *BOWEL PROBLEMS  UNUSUAL RASH Items with * indicate a potential emergency and should be followed up as soon as possible.  Feel free to call the clinic should you have any questions or concerns. The clinic phone number is [phone].  Please show the CHEMO ALERT CARD at check-in to the Emergency Department and triage nurse.   

## 2018-12-26 NOTE — Progress Notes (Signed)
Pt presents today for Velcade injection. VSS. Pt has no complaints of any pain or changes since the last visit. MAR reviewed. Labs drawn. Appt schedule printed off and given to patient.   VO received today to start patient on Procrit 20,000 weekly. Labs reviewed by Dr. Delton Coombes. MD aware hgb 7.7. No authorization needed per [employer].   Procrit and Velcade today. Pt tolerated well. Appt schedule given. Niece to call Jene Every Nurse Navigator, pertaining to Revlimid. Pt ambulated off the unit.

## 2018-12-26 NOTE — Progress Notes (Signed)
I spoke with Dr. Delton Coombes today and patient is to be taking Revlimid 14 days on and 7 days off.  I spoke with Oneita Jolly, patient's niece and she states that patient has not been taking the revlimid correctly and she doesn't remember the last time she took a pill.  I advised her per Dr. Delton Coombes, to start taking the revlimid today 12/30 and take it through 1/12 then stop for 7 days which she will restart it again on 1/20.  I advised her to write it on the calendar for Mrs. Mooty and she states that she has already completed that.  With patient starting the Revlimid back today, this makes her synchronous with her Velcade injections.  I advised them to call anytime they have any questions or concerns.

## 2018-12-26 NOTE — Progress Notes (Signed)
Pt unable to confirm if she is taking Revlimid. Niece to call Nurse Navigator.

## 2019-01-02 ENCOUNTER — Encounter (HOSPITAL_COMMUNITY): Payer: Self-pay

## 2019-01-02 ENCOUNTER — Inpatient Hospital Stay (HOSPITAL_COMMUNITY): Payer: Medicare Other

## 2019-01-02 ENCOUNTER — Inpatient Hospital Stay (HOSPITAL_COMMUNITY): Payer: Medicare Other | Attending: Hematology

## 2019-01-02 VITALS — BP 121/49 | HR 62 | Temp 98.6°F | Resp 18 | Wt 151.6 lb

## 2019-01-02 DIAGNOSIS — Z5112 Encounter for antineoplastic immunotherapy: Secondary | ICD-10-CM | POA: Insufficient documentation

## 2019-01-02 DIAGNOSIS — N189 Chronic kidney disease, unspecified: Secondary | ICD-10-CM | POA: Diagnosis present

## 2019-01-02 DIAGNOSIS — E538 Deficiency of other specified B group vitamins: Secondary | ICD-10-CM

## 2019-01-02 DIAGNOSIS — C9000 Multiple myeloma not having achieved remission: Secondary | ICD-10-CM

## 2019-01-02 DIAGNOSIS — D631 Anemia in chronic kidney disease: Secondary | ICD-10-CM | POA: Diagnosis not present

## 2019-01-02 LAB — CBC WITH DIFFERENTIAL/PLATELET
Abs Immature Granulocytes: 0.01 10*3/uL (ref 0.00–0.07)
Basophils Absolute: 0 10*3/uL (ref 0.0–0.1)
Basophils Relative: 0 %
EOS PCT: 4 %
Eosinophils Absolute: 0.1 10*3/uL (ref 0.0–0.5)
HCT: 25.2 % — ABNORMAL LOW (ref 36.0–46.0)
Hemoglobin: 7.7 g/dL — ABNORMAL LOW (ref 12.0–15.0)
Immature Granulocytes: 0 %
Lymphocytes Relative: 24 %
Lymphs Abs: 0.8 10*3/uL (ref 0.7–4.0)
MCH: 32.5 pg (ref 26.0–34.0)
MCHC: 30.6 g/dL (ref 30.0–36.0)
MCV: 106.3 fL — ABNORMAL HIGH (ref 80.0–100.0)
MONO ABS: 0.2 10*3/uL (ref 0.1–1.0)
Monocytes Relative: 6 %
Neutro Abs: 2.2 10*3/uL (ref 1.7–7.7)
Neutrophils Relative %: 66 %
Platelets: 138 10*3/uL — ABNORMAL LOW (ref 150–400)
RBC: 2.37 MIL/uL — ABNORMAL LOW (ref 3.87–5.11)
RDW: 18 % — ABNORMAL HIGH (ref 11.5–15.5)
WBC: 3.3 10*3/uL — ABNORMAL LOW (ref 4.0–10.5)
nRBC: 0 % (ref 0.0–0.2)

## 2019-01-02 LAB — COMPREHENSIVE METABOLIC PANEL
ALT: 9 U/L (ref 0–44)
AST: 10 U/L — ABNORMAL LOW (ref 15–41)
Albumin: 3.5 g/dL (ref 3.5–5.0)
Alkaline Phosphatase: 52 U/L (ref 38–126)
Anion gap: 6 (ref 5–15)
BUN: 15 mg/dL (ref 8–23)
CALCIUM: 8.8 mg/dL — AB (ref 8.9–10.3)
CO2: 24 mmol/L (ref 22–32)
Chloride: 107 mmol/L (ref 98–111)
Creatinine, Ser: 0.93 mg/dL (ref 0.44–1.00)
GFR calc Af Amer: >60 mL/min (ref >60)
GFR, EST NON AFRICAN AMERICAN: 59 mL/min — AB (ref >60)
Glucose, Bld: 93 mg/dL (ref 70–99)
Potassium: 4 mmol/L (ref 3.5–5.1)
Sodium: 137 mmol/L (ref 135–145)
Total Bilirubin: 0.5 mg/dL (ref 0.3–1.2)
Total Protein: 6.3 g/dL — ABNORMAL LOW (ref 6.5–8.1)

## 2019-01-02 MED ORDER — PROCHLORPERAZINE MALEATE 10 MG PO TABS
10.0000 mg | ORAL_TABLET | Freq: Once | ORAL | Status: AC
  Administered 2019-01-02: 10 mg via ORAL
  Filled 2019-01-02: qty 1

## 2019-01-02 MED ORDER — DIPHENHYDRAMINE HCL 50 MG/ML IJ SOLN: 50.0000 mg | Freq: Once | INTRAMUSCULAR | Status: DC | PRN

## 2019-01-02 MED ORDER — CYANOCOBALAMIN 1000 MCG/ML IJ SOLN
1000.0000 ug | Freq: Once | INTRAMUSCULAR | Status: AC
  Administered 2019-01-02: 1000 ug via INTRAMUSCULAR
  Filled 2019-01-02: qty 1

## 2019-01-02 MED ORDER — METHYLPREDNISOLONE SODIUM SUCC 125 MG IJ SOLR: 125.0000 mg | Freq: Once | INTRAMUSCULAR | Status: DC | PRN

## 2019-01-02 MED ORDER — SODIUM CHLORIDE 0.9 % IV SOLN: Freq: Once | INTRAVENOUS | Status: DC | PRN

## 2019-01-02 MED ORDER — BORTEZOMIB CHEMO SQ INJECTION 3.5 MG (2.5MG/ML)
1.3000 mg/m2 | Freq: Once | INTRAMUSCULAR | Status: AC
  Administered 2019-01-02: 2.25 mg via SUBCUTANEOUS
  Filled 2019-01-02: qty 0.9

## 2019-01-02 MED ORDER — DIPHENHYDRAMINE HCL 50 MG/ML IJ SOLN: 25.0000 mg | Freq: Once | INTRAMUSCULAR | Status: DC | PRN

## 2019-01-02 MED ORDER — DENOSUMAB 120 MG/1.7ML ~~LOC~~ SOLN
120.0000 mg | Freq: Once | SUBCUTANEOUS | Status: AC
  Administered 2019-01-02: 120 mg via SUBCUTANEOUS
  Filled 2019-01-02: qty 1.7

## 2019-01-02 MED ORDER — EPINEPHRINE HCL 0.1 MG/ML IJ SOLN: 0.2500 mg | Freq: Once | INTRAMUSCULAR | Status: DC | PRN

## 2019-01-02 MED ORDER — ALBUTEROL SULFATE (2.5 MG/3ML) 0.083% IN NEBU: 2.5000 mg | INHALATION_SOLUTION | Freq: Once | RESPIRATORY_TRACT | Status: DC | PRN

## 2019-01-02 MED ORDER — EPOETIN ALFA 20000 UNIT/ML IJ SOLN
20000.0000 [IU] | Freq: Once | INTRAMUSCULAR | Status: AC
  Administered 2019-01-02: 20000 [IU] via SUBCUTANEOUS
  Filled 2019-01-02: qty 1

## 2019-01-02 NOTE — Patient Instructions (Signed)
[facility] Discharge Instructions for Patients Receiving Chemotherapy   Beginning January 23rd 2017 lab work for the [facility] will be done in the  Main lab at [facility] on 1st floor. If you have a lab appointment with the [facility] please come in thru the  Main Entrance and check in at the main information desk   Today you received the following chemotherapy agents Velcade injection as well as Vit B12,Xgeva and Procrit injections. Follow-up as scheduled. Call clinic for any questions or concerns  To help prevent nausea and vomiting after your treatment, we encourage you to take your nausea medication   If you develop nausea and vomiting, or diarrhea that is not controlled by your medication, call the clinic.  The clinic phone number is [phone]. Office hours are Monday-Friday 8:30am-5:00pm.  BELOW ARE SYMPTOMS THAT SHOULD BE REPORTED IMMEDIATELY:  *FEVER GREATER THAN 101.0 F  *CHILLS WITH OR WITHOUT FEVER  NAUSEA AND VOMITING THAT IS NOT CONTROLLED WITH YOUR NAUSEA MEDICATION  *UNUSUAL SHORTNESS OF BREATH  *UNUSUAL BRUISING OR BLEEDING  TENDERNESS IN MOUTH AND THROAT WITH OR WITHOUT PRESENCE OF ULCERS  *URINARY PROBLEMS  *BOWEL PROBLEMS  UNUSUAL RASH Items with * indicate a potential emergency and should be followed up as soon as possible. If you have an emergency after office hours please contact your primary care physician or go to the nearest emergency department.  Please call the clinic during office hours if you have any questions or concerns.   You may also contact the Patient Navigator at [phone] should you have any questions or need assistance in obtaining follow up care.      Resources For Cancer Patients and their Caregivers ? American Cancer Society: Can assist with transportation, wigs, general needs, runs Look Good Feel Better.        608-748-3260 ? Cancer Care: Provides financial assistance, online support  groups, medication/co-pay assistance.  1-800-813-HOPE (806) 049-8829) ? [place] Assists [place] cancer patients and their families through emotional , educational and financial support.  619-224-1049 ? [place] DSS Where to apply for food stamps, Medicaid and utility assistance. 337-220-5754 ? [employer]: Transportation to medical appointments. 250-282-3462 ? Social Security Administration: May apply for disability if have a Stage IV cancer. 848-135-8539 [phone] ? [employer], Disability and Transit Services: Assists with nutrition, care and transit needs. [phone]

## 2019-01-02 NOTE — Progress Notes (Signed)
1329 Labs,including Hgb 7.7, reviewed with Dr. Delton Coombes and pt approved for Velcade and Procrit injections today per MD.                                 Yves Dill Sabic tolerated Velcade,Procrit,Xgeva and Vit B12 injections well without complaints or incident. Calcium 8.8 today and pt denied any tooth or jaw pain and no recent or future dental visits prior to administering the Xgeva injection. VSS Pt discharged self ambulatory in satisfactory condition accompanied by family member

## 2019-01-09 ENCOUNTER — Other Ambulatory Visit (HOSPITAL_COMMUNITY): Payer: Medicare Other

## 2019-01-09 ENCOUNTER — Inpatient Hospital Stay (HOSPITAL_COMMUNITY): Payer: Medicare Other

## 2019-01-09 ENCOUNTER — Other Ambulatory Visit (HOSPITAL_COMMUNITY): Payer: Self-pay | Admitting: Hematology

## 2019-01-09 ENCOUNTER — Ambulatory Visit (HOSPITAL_COMMUNITY): Payer: Medicare Other | Admitting: Hematology

## 2019-01-09 ENCOUNTER — Ambulatory Visit (HOSPITAL_COMMUNITY): Payer: Medicare Other

## 2019-01-09 ENCOUNTER — Other Ambulatory Visit (HOSPITAL_COMMUNITY): Payer: Self-pay | Admitting: *Deleted

## 2019-01-09 VITALS — BP 120/49 | HR 67 | Temp 98.6°F | Resp 18 | Wt 147.2 lb

## 2019-01-09 DIAGNOSIS — C9000 Multiple myeloma not having achieved remission: Secondary | ICD-10-CM

## 2019-01-09 DIAGNOSIS — E538 Deficiency of other specified B group vitamins: Secondary | ICD-10-CM

## 2019-01-09 DIAGNOSIS — Z5112 Encounter for antineoplastic immunotherapy: Secondary | ICD-10-CM | POA: Diagnosis not present

## 2019-01-09 LAB — CBC WITH DIFFERENTIAL/PLATELET
Abs Immature Granulocytes: 0.01 10*3/uL (ref 0.00–0.07)
BASOS ABS: 0 10*3/uL (ref 0.0–0.1)
Basophils Relative: 0 %
Eosinophils Absolute: 0.1 10*3/uL (ref 0.0–0.5)
Eosinophils Relative: 3 %
HCT: 28.6 % — ABNORMAL LOW (ref 36.0–46.0)
Hemoglobin: 8.5 g/dL — ABNORMAL LOW (ref 12.0–15.0)
IMMATURE GRANULOCYTES: 0 %
Lymphocytes Relative: 20 %
Lymphs Abs: 0.9 10*3/uL (ref 0.7–4.0)
MCH: 32.4 pg (ref 26.0–34.0)
MCHC: 29.7 g/dL — ABNORMAL LOW (ref 30.0–36.0)
MCV: 109.2 fL — ABNORMAL HIGH (ref 80.0–100.0)
Monocytes Absolute: 0.7 10*3/uL (ref 0.1–1.0)
Monocytes Relative: 15 %
NEUTROS ABS: 2.6 10*3/uL (ref 1.7–7.7)
NEUTROS PCT: 62 %
Platelets: 131 10*3/uL — ABNORMAL LOW (ref 150–400)
RBC: 2.62 MIL/uL — ABNORMAL LOW (ref 3.87–5.11)
RDW: 18.4 % — ABNORMAL HIGH (ref 11.5–15.5)
WBC: 4.3 10*3/uL (ref 4.0–10.5)
nRBC: 0 % (ref 0.0–0.2)

## 2019-01-09 LAB — COMPREHENSIVE METABOLIC PANEL
ALT: 8 U/L (ref 0–44)
AST: 10 U/L — AB (ref 15–41)
Albumin: 3.6 g/dL (ref 3.5–5.0)
Alkaline Phosphatase: 46 U/L (ref 38–126)
Anion gap: 9 (ref 5–15)
BUN: 11 mg/dL (ref 8–23)
CO2: 26 mmol/L (ref 22–32)
Calcium: 8.1 mg/dL — ABNORMAL LOW (ref 8.9–10.3)
Chloride: 105 mmol/L (ref 98–111)
Creatinine, Ser: 1.07 mg/dL — ABNORMAL HIGH (ref 0.44–1.00)
GFR calc Af Amer: 58 mL/min — ABNORMAL LOW (ref >60)
GFR calc non Af Amer: 50 mL/min — ABNORMAL LOW (ref >60)
GLUCOSE: 126 mg/dL — AB (ref 70–99)
Potassium: 3.1 mmol/L — ABNORMAL LOW (ref 3.5–5.1)
Sodium: 140 mmol/L (ref 135–145)
Total Bilirubin: 0.9 mg/dL (ref 0.3–1.2)
Total Protein: 6.3 g/dL — ABNORMAL LOW (ref 6.5–8.1)

## 2019-01-09 MED ORDER — PROCHLORPERAZINE MALEATE 10 MG PO TABS
10.0000 mg | ORAL_TABLET | Freq: Once | ORAL | Status: AC
  Administered 2019-01-09: 10 mg via ORAL

## 2019-01-09 MED ORDER — EPOETIN ALFA 20000 UNIT/ML IJ SOLN
INTRAMUSCULAR | Status: AC
  Filled 2019-01-09: qty 1

## 2019-01-09 MED ORDER — EPOETIN ALFA 20000 UNIT/ML IJ SOLN
20000.0000 [IU] | Freq: Once | INTRAMUSCULAR | Status: AC
  Administered 2019-01-09: 20000 [IU] via SUBCUTANEOUS

## 2019-01-09 MED ORDER — BORTEZOMIB CHEMO SQ INJECTION 3.5 MG (2.5MG/ML)
1.3000 mg/m2 | Freq: Once | INTRAMUSCULAR | Status: AC
  Administered 2019-01-09: 2.25 mg via SUBCUTANEOUS
  Filled 2019-01-09: qty 0.9

## 2019-01-09 MED ORDER — PROCHLORPERAZINE MALEATE 10 MG PO TABS
ORAL_TABLET | ORAL | Status: AC
  Filled 2019-01-09: qty 1

## 2019-01-09 MED ORDER — LENALIDOMIDE 20 MG PO CAPS: ORAL_CAPSULE | ORAL | 0 refills | Status: DC

## 2019-01-09 NOTE — Progress Notes (Signed)
Brittany Archer tolerated Velcade and Procrit injections without incident or complaint. VSS. Discharged self ambulatory in satisfactory condition in presence of family member.

## 2019-01-09 NOTE — Patient Instructions (Signed)
[facility] Discharge Instructions for Patients Receiving Chemotherapy   Beginning January 23rd 2017 lab work for the [facility] will be done in the  Main lab at [facility] on 1st floor. If you have a lab appointment with the [facility] please come in thru the  Main Entrance and check in at the main information desk   Today you received the following chemotherapy agents Velcade. You also received Procrit.  To help prevent nausea and vomiting after your treatment, we encourage you to take your nausea medication   If you develop nausea and vomiting, or diarrhea that is not controlled by your medication, call the clinic.  The clinic phone number is [phone]. Office hours are Monday-Friday 8:30am-5:00pm.  BELOW ARE SYMPTOMS THAT SHOULD BE REPORTED IMMEDIATELY:  *FEVER GREATER THAN 101.0 F  *CHILLS WITH OR WITHOUT FEVER  NAUSEA AND VOMITING THAT IS NOT CONTROLLED WITH YOUR NAUSEA MEDICATION  *UNUSUAL SHORTNESS OF BREATH  *UNUSUAL BRUISING OR BLEEDING  TENDERNESS IN MOUTH AND THROAT WITH OR WITHOUT PRESENCE OF ULCERS  *URINARY PROBLEMS  *BOWEL PROBLEMS  UNUSUAL RASH Items with * indicate a potential emergency and should be followed up as soon as possible. If you have an emergency after office hours please contact your primary care physician or go to the nearest emergency department.  Please call the clinic during office hours if you have any questions or concerns.   You may also contact the Patient Navigator at [phone] should you have any questions or need assistance in obtaining follow up care.      Resources For Cancer Patients and their Caregivers ? American Cancer Society: Can assist with transportation, wigs, general needs, runs Look Good Feel Better.        ([phone] ? Cancer Care: Provides financial assistance, online support groups, medication/co-pay assistance.  1-800-813-HOPE 616-422-8219) ? [place] Assists [place] cancer patients and their families through emotional , educational and financial support.  270-821-5478 ? [place] DSS Where to apply for food stamps, Medicaid and utility assistance. ([phone] ? RCATS: Transportation to medical appointments. (832) [phone] ? Social Security Administration: May apply for disability if have a Stage IV cancer. 707-385-7058 [phone] ? [employer], Disability and Transit Services: Assists with nutrition, care and transit needs. [phone]

## 2019-01-09 NOTE — Telephone Encounter (Signed)
Chart reviewed, revlimid refilled. 

## 2019-01-23 ENCOUNTER — Encounter (HOSPITAL_COMMUNITY): Payer: Self-pay | Admitting: Hematology

## 2019-01-23 ENCOUNTER — Ambulatory Visit (HOSPITAL_COMMUNITY): Payer: Medicare Other

## 2019-01-23 ENCOUNTER — Inpatient Hospital Stay (HOSPITAL_COMMUNITY): Payer: Medicare Other

## 2019-01-23 ENCOUNTER — Inpatient Hospital Stay (HOSPITAL_BASED_OUTPATIENT_CLINIC_OR_DEPARTMENT_OTHER): Payer: Medicare Other | Admitting: Hematology

## 2019-01-23 ENCOUNTER — Other Ambulatory Visit (HOSPITAL_COMMUNITY): Payer: Medicare Other

## 2019-01-23 VITALS — BP 129/60 | HR 64 | Temp 98.3°F | Resp 16 | Wt 151.0 lb

## 2019-01-23 DIAGNOSIS — C9000 Multiple myeloma not having achieved remission: Secondary | ICD-10-CM

## 2019-01-23 DIAGNOSIS — E538 Deficiency of other specified B group vitamins: Secondary | ICD-10-CM

## 2019-01-23 DIAGNOSIS — E876 Hypokalemia: Secondary | ICD-10-CM

## 2019-01-23 DIAGNOSIS — Z5112 Encounter for antineoplastic immunotherapy: Secondary | ICD-10-CM | POA: Diagnosis not present

## 2019-01-23 LAB — COMPREHENSIVE METABOLIC PANEL
ALT: 9 U/L (ref 0–44)
AST: 11 U/L — ABNORMAL LOW (ref 15–41)
Albumin: 3.4 g/dL — ABNORMAL LOW (ref 3.5–5.0)
Alkaline Phosphatase: 48 U/L (ref 38–126)
Anion gap: 7 (ref 5–15)
BUN: 18 mg/dL (ref 8–23)
CO2: 26 mmol/L (ref 22–32)
Calcium: 8.4 mg/dL — ABNORMAL LOW (ref 8.9–10.3)
Chloride: 107 mmol/L (ref 98–111)
Creatinine, Ser: 0.95 mg/dL (ref 0.44–1.00)
GFR calc Af Amer: >60 mL/min (ref >60)
GFR calc non Af Amer: 57 mL/min — ABNORMAL LOW (ref >60)
Glucose, Bld: 91 mg/dL (ref 70–99)
Potassium: 3.6 mmol/L (ref 3.5–5.1)
Sodium: 140 mmol/L (ref 135–145)
Total Bilirubin: 0.8 mg/dL (ref 0.3–1.2)
Total Protein: 6.2 g/dL — ABNORMAL LOW (ref 6.5–8.1)

## 2019-01-23 LAB — CBC WITH DIFFERENTIAL/PLATELET
Abs Immature Granulocytes: 0.01 10*3/uL (ref 0.00–0.07)
Basophils Absolute: 0 10*3/uL (ref 0.0–0.1)
Basophils Relative: 1 %
EOS ABS: 0 10*3/uL (ref 0.0–0.5)
Eosinophils Relative: 2 %
HCT: 28.8 % — ABNORMAL LOW (ref 36.0–46.0)
Hemoglobin: 8.9 g/dL — ABNORMAL LOW (ref 12.0–15.0)
Immature Granulocytes: 0 %
LYMPHS ABS: 0.7 10*3/uL (ref 0.7–4.0)
Lymphocytes Relative: 32 %
MCH: 33.6 pg (ref 26.0–34.0)
MCHC: 30.9 g/dL (ref 30.0–36.0)
MCV: 108.7 fL — ABNORMAL HIGH (ref 80.0–100.0)
Monocytes Absolute: 0.2 10*3/uL (ref 0.1–1.0)
Monocytes Relative: 8 %
Neutro Abs: 1.3 10*3/uL — ABNORMAL LOW (ref 1.7–7.7)
Neutrophils Relative %: 57 %
Platelets: 179 10*3/uL (ref 150–400)
RBC: 2.65 MIL/uL — ABNORMAL LOW (ref 3.87–5.11)
RDW: 17.2 % — ABNORMAL HIGH (ref 11.5–15.5)
WBC: 2.3 10*3/uL — ABNORMAL LOW (ref 4.0–10.5)
nRBC: 0 % (ref 0.0–0.2)

## 2019-01-23 MED ORDER — EPOETIN ALFA 20000 UNIT/ML IJ SOLN
INTRAMUSCULAR | Status: AC
  Filled 2019-01-23: qty 1

## 2019-01-23 MED ORDER — PROCHLORPERAZINE MALEATE 10 MG PO TABS
10.0000 mg | ORAL_TABLET | Freq: Once | ORAL | Status: AC
  Administered 2019-01-23: 10 mg via ORAL

## 2019-01-23 MED ORDER — PROCHLORPERAZINE MALEATE 10 MG PO TABS
ORAL_TABLET | ORAL | Status: AC
  Filled 2019-01-23: qty 1

## 2019-01-23 MED ORDER — EPOETIN ALFA 20000 UNIT/ML IJ SOLN
20000.0000 [IU] | Freq: Once | INTRAMUSCULAR | Status: AC
  Administered 2019-01-23: 20000 [IU] via SUBCUTANEOUS

## 2019-01-23 MED ORDER — BORTEZOMIB CHEMO SQ INJECTION 3.5 MG (2.5MG/ML)
1.3000 mg/m2 | Freq: Once | INTRAMUSCULAR | Status: AC
  Administered 2019-01-23: 2.25 mg via SUBCUTANEOUS
  Filled 2019-01-23: qty 0.9

## 2019-01-23 NOTE — Patient Instructions (Signed)
[facility] Discharge Instructions     Thank you for choosing [facility] at [facility] to provide your oncology and hematology care.  To afford each patient quality time with our provider, please arrive at least 15 minutes before your scheduled appointment time.   If you have a lab appointment with the Cancer Center please come in thru the  Main Entrance and check in at the main information desk  You need to re-schedule your appointment should you arrive 10 or more minutes late.  We strive to give you quality time with our providers, and arriving late affects you and other patients whose appointments are after yours.  Also, if you no show three or more times for appointments you may be dismissed from the clinic at the providers discretion.     Again, thank you for choosing [facility].  Our hope is that these requests will decrease the amount of time that you wait before being seen by our physicians.       _____________________________________________________________  Should you have questions after your visit to [facility], please contact our office at [phone] between the hours of 8:00 a.m. and 4:30 p.m.  Voicemails left after 4:00 p.m. will not be returned until the following business day.  For prescription refill requests, have your pharmacy contact our office and allow 72 hours.    Cancer Center Support Programs:   > Cancer Support Group  2nd Tuesday of the month 1pm-2pm, Journey Room    

## 2019-01-23 NOTE — Patient Instructions (Signed)
[facility] Discharge Instructions for Patients Receiving Chemotherapy   Beginning January 23rd 2017 lab work for the [facility] will be done in the  Main lab at [facility] on 1st floor. If you have a lab appointment with the [facility] please come in thru the  Main Entrance and check in at the main information desk   Today you received the following chemotherapy agents Velcade. You also received Procrit.  To help prevent nausea and vomiting after your treatment, we encourage you to take your nausea medication   If you develop nausea and vomiting, or diarrhea that is not controlled by your medication, call the clinic.  The clinic phone number is [phone]. Office hours are Monday-Friday 8:30am-5:00pm.  BELOW ARE SYMPTOMS THAT SHOULD BE REPORTED IMMEDIATELY:  *FEVER GREATER THAN 101.0 F  *CHILLS WITH OR WITHOUT FEVER  NAUSEA AND VOMITING THAT IS NOT CONTROLLED WITH YOUR NAUSEA MEDICATION  *UNUSUAL SHORTNESS OF BREATH  *UNUSUAL BRUISING OR BLEEDING  TENDERNESS IN MOUTH AND THROAT WITH OR WITHOUT PRESENCE OF ULCERS  *URINARY PROBLEMS  *BOWEL PROBLEMS  UNUSUAL RASH Items with * indicate a potential emergency and should be followed up as soon as possible. If you have an emergency after office hours please contact your primary care physician or go to the nearest emergency department.  Please call the clinic during office hours if you have any questions or concerns.   You may also contact the Patient Navigator at [phone] should you have any questions or need assistance in obtaining follow up care.      Resources For Cancer Patients and their Caregivers ? American Cancer Society: Can assist with transportation, wigs, general needs, runs Look Good Feel Better.        267-491-1587 ? Cancer Care: Provides financial assistance, online support groups, medication/co-pay assistance.  1-800-813-HOPE 780-542-6346) ? [facility] Assists [place] cancer patients and their families through emotional , educational and financial support.  ([phone] [phone] ? [place] DSS Where to apply for food stamps, Medicaid and utility assistance. 647-662-0614 ? [employer]: Transportation to medical appointments. 534-429-2872 ? Social Security Administration: May apply for disability if have a Stage IV cancer. [phone] [phone] ? [employer], Disability and Transit Services: Assists with nutrition, care and transit needs. [phone]

## 2019-01-23 NOTE — Progress Notes (Signed)
Patient seen by Dr. Delton Coombes and lab work reviewed, including ANC 1.3. Per MD, ok to proceed with Velcade and Procrit injections today.  Brittany Archer tolerated Velcade and Procrit injections without incident or complaints. Discharged self ambulatory in satisfactory condition.

## 2019-01-23 NOTE — Progress Notes (Signed)
[place], [place] [address]   CLINIC:  Medical Oncology/Hematology  PCP:  Vesta Mixer [address] [place] [address] [phone]   REASON FOR VISIT: Follow-up for multiple myeloma  CURRENT THERAPY: Valcade, Revlimd, and dexamethasone every 3 weeks  BRIEF ONCOLOGIC HISTORY:    Multiple myeloma not having achieved remission ([place])   01/20/2018 Initial Diagnosis    Multiple myeloma not having achieved remission ([place])    01/26/2018 -  Chemotherapy    The patient had bortezomib SQ (VELCADE) chemo injection 2.5 mg, 1.3 mg/m2 = 2.5 mg, Subcutaneous,  Once, 14 of 16 cycles Administration: 2.5 mg (01/26/2018), 2.5 mg (02/02/2018), 2.5 mg (02/09/2018), 2.5 mg (02/16/2018), 2.5 mg (02/23/2018), 2.5 mg (03/02/2018), 2.5 mg (03/09/2018), 2.5 mg (03/30/2018), 2.5 mg (04/06/2018), 2.5 mg (04/13/2018), 2.5 mg (04/21/2018), 2.5 mg (05/06/2018), 2.5 mg (05/11/2018), 2.5 mg (05/20/2018), 2.5 mg (05/27/2018), 2.5 mg (06/10/2018), 2.5 mg (06/17/2018), 2.5 mg (06/24/2018), 2.5 mg (07/08/2018), 2.5 mg (07/15/2018), 2.5 mg (07/22/2018), 2.5 mg (08/05/2018), 2.5 mg (08/12/2018), 2.5 mg (09/02/2018), 2.5 mg (09/09/2018), 2.5 mg (09/16/2018), 2.5 mg (09/30/2018), 2.5 mg (10/07/2018), 2.5 mg (10/14/2018), 2.5 mg (10/31/2018), 2.5 mg (11/07/2018), 2.5 mg (11/14/2018), 2.5 mg (11/28/2018), 2.25 mg (12/05/2018), 2.25 mg (12/12/2018), 2.25 mg (12/26/2018), 2.25 mg (01/02/2019), 2.25 mg (01/09/2019)  for chemotherapy treatment.      INTERVAL HISTORY:  Ms. Pippins 78 y.o. female returns for routine follow-up for multiple myeloma. She is here today with her daughter. She has been tolerating treatment well. She has no complains besides intermittent numbness in her toes. Denies any nausea, vomiting, or diarrhea. Denies any new pains. Had not noticed any recent bleeding such as epistaxis, hematuria or hematochezia. Denies recent chest pain on exertion, shortness of breath on minimal exertion,  pre-syncopal episodes, or palpitations. Denies any numbness or tingling in hands. Denies any recent fevers, infections, or recent hospitalizations. Patient reports appetite at 100% and energy level at 75%.    REVIEW OF SYSTEMS:  Review of Systems  Neurological: Positive for numbness.  All other systems reviewed and are negative.    PAST MEDICAL/SURGICAL HISTORY:  Past Medical History:  Diagnosis Date  . Breast cancer ([place])    left breast/ 2008/ surg/ rad tx  . Coronary artery disease   . Diabetes mellitus    Past Surgical History:  Procedure Laterality Date  . ABDOMINAL HYSTERECTOMY    . BREAST SURGERY       SOCIAL HISTORY:  Social History   Socioeconomic History  . Marital status: Divorced    Spouse name: Not on file  . Number of children: Not on file  . Years of education: Not on file  . Highest education level: Not on file  Occupational History  . Not on file  Social Needs  . Financial resource strain: Not on file  . Food insecurity:    Worry: Not on file    Inability: Not on file  . Transportation needs:    Medical: Not on file    Non-medical: Not on file  Tobacco Use  . Smoking status: Never Smoker  . Smokeless tobacco: Never Used  Substance and Sexual Activity  . Alcohol use: No  . Drug use: No  . Sexual activity: Yes    Birth control/protection: Surgical  Lifestyle  . Physical activity:    Days per week: Not on file    Minutes per session: Not on file  . Stress: Not on file  Relationships  .  Social connections:    Talks on phone: Not on file    Gets together: Not on file    Attends religious service: Not on file    Active member of club or organization: Not on file    Attends meetings of clubs or organizations: Not on file    Relationship status: Not on file  . Intimate partner violence:    Fear of current or ex partner: Not on file    Emotionally abused: Not on file    Physically abused: Not on file    Forced sexual activity: Not on file    Other Topics Concern  . Not on file  Social History Narrative  . Not on file    FAMILY HISTORY:  Family History  Problem Relation Age of Onset  . Obesity Sister     CURRENT MEDICATIONS:  Outpatient Encounter Medications as of 01/23/2019  Medication Sig  . acetaminophen (TYLENOL) 500 MG tablet Take 500 mg by mouth every 6 (six) hours as needed for mild pain or moderate pain.  Marland Kitchen acyclovir (ZOVIRAX) 400 MG tablet Take 1 tablet (400 mg total) by mouth 2 (two) times daily.  Marland Kitchen aspirin 81 MG tablet Take 81 mg by mouth daily.    . bortezomib IV (VELCADE) 3.5 MG injection Inject into the vein once. weekly  . cholecalciferol (VITAMIN D) 1000 units tablet Take 1,000 Units by mouth daily.  . Denosumab (XGEVA [place]) Inject into the skin. Every 28 days  . dexamethasone (DECADRON) 4 MG tablet Take 10 tablets (40 mg) on days 1, 8, and 15 of chemo. Repeat every 21 days.  Marland Kitchen glipiZIDE (GLUCOTROL) 5 MG tablet Take 5 mg by mouth daily before breakfast.   . lenalidomide (REVLIMID) 20 MG capsule Take one capsule daily on days 1-14 every 21 days.  Marland Kitchen lisinopril-hydrochlorothiazide (PRINZIDE,ZESTORETIC) 20-25 MG tablet Take 1 tablet by mouth daily.   . metFORMIN (GLUCOPHAGE) 1000 MG tablet Take 1,000 mg by mouth 2 times daily at 12 noon and 4 pm.    . ondansetron (ZOFRAN) 8 MG tablet Take 1 tablet (8 mg total) by mouth 2 (two) times daily as needed (Nausea or vomiting).  . potassium chloride SA (K-DUR,KLOR-CON) 20 MEQ tablet Take 1 tablet (20 mEq total) by mouth 3 (three) times daily.  . prochlorperazine (COMPAZINE) 10 MG tablet Take 1 tablet (10 mg total) by mouth every 6 (six) hours as needed (Nausea or vomiting).   No facility-administered encounter medications on file as of 01/23/2019.     ALLERGIES:  Allergies  Allergen Reactions  . Motrin [Ibuprofen] Rash     PHYSICAL EXAM:  ECOG Performance status: 1  Vitals:   01/23/19 0915  BP: 129/60  Pulse: 64  Resp: 16  Temp: 98.3 F (36.8 C)  SpO2:  100%   Filed Weights   01/23/19 0915  Weight: 151 lb (68.5 kg)    Physical Exam Constitutional:      Appearance: Normal appearance. She is normal weight.  Cardiovascular:     Rate and Rhythm: Normal rate and regular rhythm.     Heart sounds: Normal heart sounds.  Pulmonary:     Effort: Pulmonary effort is normal.     Breath sounds: Normal breath sounds.  Musculoskeletal: Normal range of motion.  Skin:    General: Skin is warm and dry.  Neurological:     Mental Status: She is alert and oriented to person, place, and time. Mental status is at baseline.  Psychiatric:  Mood and Affect: Mood normal.        Behavior: Behavior normal.        Thought Content: Thought content normal.        Judgment: Judgment normal.      LABORATORY DATA:  I have reviewed the labs as listed.  CBC    Component Value Date/Time   WBC 2.3 (L) 01/23/2019 0852   RBC 2.65 (L) 01/23/2019 0852   HGB 8.9 (L) 01/23/2019 0852   HCT 28.8 (L) 01/23/2019 0852   PLT 179 01/23/2019 0852   MCV 108.7 (H) 01/23/2019 0852   MCH 33.6 01/23/2019 0852   MCHC 30.9 01/23/2019 0852   RDW 17.2 (H) 01/23/2019 0852   LYMPHSABS 0.7 01/23/2019 0852   MONOABS 0.2 01/23/2019 0852   EOSABS 0.0 01/23/2019 0852   BASOSABS 0.0 01/23/2019 0852   CMP Latest Ref Rng & Units 01/23/2019 01/09/2019 01/02/2019  Glucose 70 - 99 mg/dL 91 126(H) 93  BUN 8 - 23 mg/dL '18 11 15  ' Creatinine 0.44 - 1.00 mg/dL 0.95 1.07(H) 0.93  Sodium 135 - 145 mmol/L 140 140 137  Potassium 3.5 - 5.1 mmol/L 3.6 3.1(L) 4.0  Chloride 98 - 111 mmol/L 107 105 107  CO2 22 - 32 mmol/L '26 26 24  ' Calcium 8.9 - 10.3 mg/dL 8.4(L) 8.1(L) 8.8(L)  Total Protein 6.5 - 8.1 g/dL 6.2(L) 6.3(L) 6.3(L)  Total Bilirubin 0.3 - 1.2 mg/dL 0.8 0.9 0.5  Alkaline Phos 38 - 126 U/L 48 46 52  AST 15 - 41 U/L 11(L) 10(L) 10(L)  ALT 0 - 44 U/L '9 8 9       ' DIAGNOSTIC IMAGING:  I have independently reviewed the scans and discussed with the patient.   I have reviewed  Francene Finders, NP's note and agree with the documentation.  I personally performed a face-to-face visit, made revisions and my assessment and plan is as follows.    ASSESSMENT & PLAN:   Multiple myeloma not having achieved remission ([place]) 1.  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: -Bone marrow biopsy on 01/03/2018 with 60% plasma cells, FISH panel with no abnormalities, chromosome analysis showing hyperdiploidy with gains of chromosomes 2, 3, 4, 7, 10, 15, beta-2 microglobulin 3.2, LDH normal, 0.9 g/dL of M spike at diagnosis, free light chain ratio of 114, Kappa Light chain of 1796 -Skeletal survey on 02/16/2018 showing subtle patchy areas of osteopenia of the thoracolumbar spine and possibly distal right clavicle which may reflect subtle changes of multiple myeloma - RVD (Velcade on days 1, 8 and 15, Revlimid 25 on days 1-21, dexamethasone 10 pills on day of Velcade every 28 days) cycle 1 started on 01/26/2018, cycle 4 on 04/13/2018, she is asynchronous with Revlimid and Velcade.  We have held her Revlimid once when [place] dropped to 600 during cycle 4. - Revlimid was dose reduced to 20 mg because of cytopenias.  - She is tolerating Velcade very well without any side effects.  Denies any peripheral neuropathy. -She is continuing dexamethasone 40 mg weekly. - We reviewed myeloma panel from 11/28/2018 which shows M spike of 0.4 g/dL.  Free light chain ratio was 1.54.  Kappa light chains were 46.  Serum immunofixation on 12/05/2018 was within normal limits. - We will see her back in 6 weeks for follow-up.  I plan to repeat multiple myeloma panel next week.  2.  Bone strengthening: -She is continue monthly denosumab injections. -She will continue calcium and vitamin D twice daily.  3.  Hypokalemia: Potassium is 3.6 today.  She will continue potassium 20 mEq 3 times a day.        Orders placed this encounter:  Orders Placed This Encounter  Procedures  . CBC with  Differential/Platelet  . Comprehensive metabolic panel  . Lactate dehydrogenase  . Protein electrophoresis, serum  . Immunofixation electrophoresis  . Kappa/lambda light chains  . Lactate dehydrogenase  . Protein electrophoresis, serum  . Immunofixation electrophoresis  . Kappa/lambda light chains  . CBC with Differential/Platelet  . Comprehensive metabolic panel      Derek Jack, MD [facility] [phone]

## 2019-01-23 NOTE — Progress Notes (Signed)
Patient is in room 413 and ready for treatment. Labs reviewed.

## 2019-01-23 NOTE — Assessment & Plan Note (Signed)
1.  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: -Bone marrow biopsy on 01/03/2018 with 60% plasma cells, FISH panel with no abnormalities, chromosome analysis showing hyperdiploidy with gains of chromosomes 2, 3, 4, 7, 10, 15, beta-2 microglobulin 3.2, LDH normal, 0.9 g/dL of M spike at diagnosis, free light chain ratio of 114, Kappa Light chain of 1796 -Skeletal survey on 02/16/2018 showing subtle patchy areas of osteopenia of the thoracolumbar spine and possibly distal right clavicle which may reflect subtle changes of multiple myeloma - RVD (Velcade on days 1, 8 and 15, Revlimid 25 on days 1-21, dexamethasone 10 pills on day of Velcade every 28 days) cycle 1 started on 01/26/2018, cycle 4 on 04/13/2018, she is asynchronous with Revlimid and Velcade.  We have held her Revlimid once when [place] dropped to 600 during cycle 4. - Revlimid was dose reduced to 20 mg because of cytopenias.  - She is tolerating Velcade very well without any side effects.  Denies any peripheral neuropathy. -She is continuing dexamethasone 40 mg weekly. - We reviewed myeloma panel from 11/28/2018 which shows M spike of 0.4 g/dL.  Free light chain ratio was 1.54.  Kappa light chains were 46.  Serum immunofixation on 12/05/2018 was within normal limits. - We will see her back in 6 weeks for follow-up.  I plan to repeat multiple myeloma panel next week.  2.  Bone strengthening: -She is continue monthly denosumab injections. -She will continue calcium and vitamin D twice daily.  3.  Hypokalemia: Potassium is 3.6 today.  She will continue potassium 20 mEq 3 times a day.

## 2019-01-26 ENCOUNTER — Other Ambulatory Visit (HOSPITAL_COMMUNITY): Payer: Self-pay | Admitting: *Deleted

## 2019-01-26 DIAGNOSIS — C9000 Multiple myeloma not having achieved remission: Secondary | ICD-10-CM

## 2019-01-26 MED ORDER — LENALIDOMIDE 20 MG PO CAPS: ORAL_CAPSULE | ORAL | 0 refills | Status: DC

## 2019-01-26 NOTE — Telephone Encounter (Signed)
Chart reviewed, revlimid refilled. 

## 2019-01-30 ENCOUNTER — Inpatient Hospital Stay (HOSPITAL_COMMUNITY): Payer: Medicare Other | Attending: Hematology

## 2019-01-30 ENCOUNTER — Inpatient Hospital Stay (HOSPITAL_COMMUNITY): Payer: Medicare Other

## 2019-01-30 ENCOUNTER — Other Ambulatory Visit (HOSPITAL_COMMUNITY): Payer: Self-pay | Admitting: Pharmacist

## 2019-01-30 ENCOUNTER — Encounter (HOSPITAL_COMMUNITY): Payer: Self-pay

## 2019-01-30 VITALS — BP 126/54 | HR 62 | Temp 97.7°F | Resp 16 | Wt 152.8 lb

## 2019-01-30 DIAGNOSIS — N189 Chronic kidney disease, unspecified: Secondary | ICD-10-CM | POA: Insufficient documentation

## 2019-01-30 DIAGNOSIS — D631 Anemia in chronic kidney disease: Secondary | ICD-10-CM | POA: Diagnosis not present

## 2019-01-30 DIAGNOSIS — Z5112 Encounter for antineoplastic immunotherapy: Secondary | ICD-10-CM | POA: Insufficient documentation

## 2019-01-30 DIAGNOSIS — C9000 Multiple myeloma not having achieved remission: Secondary | ICD-10-CM | POA: Diagnosis present

## 2019-01-30 DIAGNOSIS — E538 Deficiency of other specified B group vitamins: Secondary | ICD-10-CM

## 2019-01-30 LAB — COMPREHENSIVE METABOLIC PANEL
ALT: 10 U/L (ref 0–44)
AST: 11 U/L — ABNORMAL LOW (ref 15–41)
Albumin: 3.4 g/dL — ABNORMAL LOW (ref 3.5–5.0)
Alkaline Phosphatase: 49 U/L (ref 38–126)
Anion gap: 7 (ref 5–15)
BILIRUBIN TOTAL: 0.6 mg/dL (ref 0.3–1.2)
BUN: 19 mg/dL (ref 8–23)
CO2: 26 mmol/L (ref 22–32)
Calcium: 8.5 mg/dL — ABNORMAL LOW (ref 8.9–10.3)
Chloride: 107 mmol/L (ref 98–111)
Creatinine, Ser: 0.91 mg/dL (ref 0.44–1.00)
GFR calc Af Amer: >60 mL/min (ref >60)
GFR calc non Af Amer: >60 mL/min (ref >60)
Glucose, Bld: 144 mg/dL — ABNORMAL HIGH (ref 70–99)
POTASSIUM: 3 mmol/L — AB (ref 3.5–5.1)
Sodium: 140 mmol/L (ref 135–145)
Total Protein: 6.3 g/dL — ABNORMAL LOW (ref 6.5–8.1)

## 2019-01-30 LAB — CBC WITH DIFFERENTIAL/PLATELET
Abs Immature Granulocytes: 0.01 10*3/uL (ref 0.00–0.07)
Basophils Absolute: 0 10*3/uL (ref 0.0–0.1)
Basophils Relative: 1 %
Eosinophils Absolute: 0.1 10*3/uL (ref 0.0–0.5)
Eosinophils Relative: 2 %
HCT: 29.8 % — ABNORMAL LOW (ref 36.0–46.0)
Hemoglobin: 9.2 g/dL — ABNORMAL LOW (ref 12.0–15.0)
Immature Granulocytes: 0 %
Lymphocytes Relative: 23 %
Lymphs Abs: 0.9 10*3/uL (ref 0.7–4.0)
MCH: 33.3 pg (ref 26.0–34.0)
MCHC: 30.9 g/dL (ref 30.0–36.0)
MCV: 108 fL — ABNORMAL HIGH (ref 80.0–100.0)
MONO ABS: 0.6 10*3/uL (ref 0.1–1.0)
MONOS PCT: 16 %
Neutro Abs: 2.1 10*3/uL (ref 1.7–7.7)
Neutrophils Relative %: 58 %
Platelets: 170 10*3/uL (ref 150–400)
RBC: 2.76 MIL/uL — ABNORMAL LOW (ref 3.87–5.11)
RDW: 17.2 % — ABNORMAL HIGH (ref 11.5–15.5)
WBC: 3.7 10*3/uL — ABNORMAL LOW (ref 4.0–10.5)
nRBC: 0 % (ref 0.0–0.2)

## 2019-01-30 LAB — LACTATE DEHYDROGENASE: LDH: 128 U/L (ref 98–192)

## 2019-01-30 MED ORDER — EPOETIN ALFA 20000 UNIT/ML IJ SOLN
20000.0000 [IU] | Freq: Once | INTRAMUSCULAR | Status: AC
  Administered 2019-01-30: 20000 [IU] via SUBCUTANEOUS
  Filled 2019-01-30: qty 1

## 2019-01-30 MED ORDER — PROCHLORPERAZINE MALEATE 10 MG PO TABS
10.0000 mg | ORAL_TABLET | Freq: Once | ORAL | Status: AC
  Administered 2019-01-30: 10 mg via ORAL
  Filled 2019-01-30: qty 1

## 2019-01-30 MED ORDER — POTASSIUM CHLORIDE CRYS ER 20 MEQ PO TBCR
40.0000 meq | EXTENDED_RELEASE_TABLET | Freq: Once | ORAL | Status: AC
  Administered 2019-01-30: 40 meq via ORAL
  Filled 2019-01-30: qty 2

## 2019-01-30 MED ORDER — CYANOCOBALAMIN 1000 MCG/ML IJ SOLN
1000.0000 ug | Freq: Once | INTRAMUSCULAR | Status: AC
  Administered 2019-01-30: 1000 ug via INTRAMUSCULAR
  Filled 2019-01-30: qty 1

## 2019-01-30 MED ORDER — DENOSUMAB 120 MG/1.7ML ~~LOC~~ SOLN
120.0000 mg | Freq: Once | SUBCUTANEOUS | Status: AC
  Administered 2019-01-30: 120 mg via SUBCUTANEOUS
  Filled 2019-01-30: qty 1.7

## 2019-01-30 MED ORDER — BORTEZOMIB CHEMO SQ INJECTION 3.5 MG (2.5MG/ML)
1.3000 mg/m2 | Freq: Once | INTRAMUSCULAR | Status: AC
  Administered 2019-01-30: 2.25 mg via SUBCUTANEOUS
  Filled 2019-01-30: qty 0.9

## 2019-01-30 NOTE — Patient Instructions (Signed)
[facility] Discharge Instructions for Patients Receiving Chemotherapy  Today you received the following chemotherapy agents   To help prevent nausea and vomiting after your treatment, we encourage you to take your nausea medication   If you develop nausea and vomiting that is not controlled by your nausea medication, call the clinic.   BELOW ARE SYMPTOMS THAT SHOULD BE REPORTED IMMEDIATELY:  *FEVER GREATER THAN 100.5 F  *CHILLS WITH OR WITHOUT FEVER  NAUSEA AND VOMITING THAT IS NOT CONTROLLED WITH YOUR NAUSEA MEDICATION  *UNUSUAL SHORTNESS OF BREATH  *UNUSUAL BRUISING OR BLEEDING  TENDERNESS IN MOUTH AND THROAT WITH OR WITHOUT PRESENCE OF ULCERS  *URINARY PROBLEMS  *BOWEL PROBLEMS  UNUSUAL RASH Items with * indicate a potential emergency and should be followed up as soon as possible.  Feel free to call the clinic should you have any questions or concerns. The clinic phone number is [phone].  Please show the CHEMO ALERT CARD at check-in to the Emergency Department and triage nurse.   

## 2019-01-30 NOTE — Progress Notes (Signed)
Labs reviewed with Dr. Delton Coombes. Will proceed with treatment today and give Potassium PO per orders. Xgeva, B12, procrit given today per orders. See MAR  Treatment given per orders. Patient tolerated it well without problems. Vitals stable and discharged home from clinic ambulatory. Follow up as scheduled.]

## 2019-01-31 LAB — PROTEIN ELECTROPHORESIS, SERUM
A/G Ratio: 1.3 (ref 0.7–1.7)
ALPHA-1-GLOBULIN: 0.2 g/dL (ref 0.0–0.4)
Albumin ELP: 3.3 g/dL (ref 2.9–4.4)
Alpha-2-Globulin: 0.8 g/dL (ref 0.4–1.0)
Beta Globulin: 0.8 g/dL (ref 0.7–1.3)
Gamma Globulin: 0.8 g/dL (ref 0.4–1.8)
Globulin, Total: 2.6 g/dL (ref 2.2–3.9)
Total Protein ELP: 5.9 g/dL — ABNORMAL LOW (ref 6.0–8.5)

## 2019-01-31 LAB — KAPPA/LAMBDA LIGHT CHAINS
Kappa free light chain: 33.2 mg/L — ABNORMAL HIGH (ref 3.3–19.4)
Kappa, lambda light chain ratio: 1.46 (ref 0.26–1.65)
Lambda free light chains: 22.7 mg/L (ref 5.7–26.3)

## 2019-02-02 LAB — IMMUNOFIXATION ELECTROPHORESIS
IgA: 279 mg/dL (ref 64–422)
IgG (Immunoglobin G), Serum: 855 mg/dL (ref 700–1600)
IgM (Immunoglobulin M), Srm: 61 mg/dL (ref 26–217)
Total Protein ELP: 6.1 g/dL (ref 6.0–8.5)

## 2019-02-06 ENCOUNTER — Other Ambulatory Visit: Payer: Self-pay

## 2019-02-06 ENCOUNTER — Inpatient Hospital Stay (HOSPITAL_COMMUNITY): Payer: Medicare Other

## 2019-02-06 ENCOUNTER — Encounter (HOSPITAL_COMMUNITY): Payer: Self-pay

## 2019-02-06 VITALS — BP 124/54 | HR 62 | Temp 98.6°F | Wt 149.4 lb

## 2019-02-06 DIAGNOSIS — C9000 Multiple myeloma not having achieved remission: Secondary | ICD-10-CM

## 2019-02-06 DIAGNOSIS — Z5112 Encounter for antineoplastic immunotherapy: Secondary | ICD-10-CM | POA: Diagnosis not present

## 2019-02-06 LAB — CBC WITH DIFFERENTIAL/PLATELET
Abs Immature Granulocytes: 0.01 10*3/uL (ref 0.00–0.07)
BASOS ABS: 0 10*3/uL (ref 0.0–0.1)
Basophils Relative: 1 %
EOS PCT: 3 %
Eosinophils Absolute: 0.1 10*3/uL (ref 0.0–0.5)
HCT: 33.3 % — ABNORMAL LOW (ref 36.0–46.0)
Hemoglobin: 10.4 g/dL — ABNORMAL LOW (ref 12.0–15.0)
Immature Granulocytes: 0 %
Lymphocytes Relative: 21 %
Lymphs Abs: 0.9 10*3/uL (ref 0.7–4.0)
MCH: 34 pg (ref 26.0–34.0)
MCHC: 31.2 g/dL (ref 30.0–36.0)
MCV: 108.8 fL — ABNORMAL HIGH (ref 80.0–100.0)
Monocytes Absolute: 0.4 10*3/uL (ref 0.1–1.0)
Monocytes Relative: 9 %
Neutro Abs: 2.9 10*3/uL (ref 1.7–7.7)
Neutrophils Relative %: 66 %
Platelets: 172 10*3/uL (ref 150–400)
RBC: 3.06 MIL/uL — ABNORMAL LOW (ref 3.87–5.11)
RDW: 17.4 % — ABNORMAL HIGH (ref 11.5–15.5)
WBC: 4.3 10*3/uL (ref 4.0–10.5)
nRBC: 0 % (ref 0.0–0.2)

## 2019-02-06 LAB — COMPREHENSIVE METABOLIC PANEL
ALT: 8 U/L (ref 0–44)
AST: 9 U/L — ABNORMAL LOW (ref 15–41)
Albumin: 3.8 g/dL (ref 3.5–5.0)
Alkaline Phosphatase: 48 U/L (ref 38–126)
Anion gap: 9 (ref 5–15)
BILIRUBIN TOTAL: 0.7 mg/dL (ref 0.3–1.2)
BUN: 13 mg/dL (ref 8–23)
CO2: 26 mmol/L (ref 22–32)
Calcium: 9.5 mg/dL (ref 8.9–10.3)
Chloride: 106 mmol/L (ref 98–111)
Creatinine, Ser: 1.02 mg/dL — ABNORMAL HIGH (ref 0.44–1.00)
GFR calc Af Amer: >60 mL/min (ref >60)
GFR calc non Af Amer: 53 mL/min — ABNORMAL LOW (ref >60)
GLUCOSE: 55 mg/dL — AB (ref 70–99)
Potassium: 3.2 mmol/L — ABNORMAL LOW (ref 3.5–5.1)
Sodium: 141 mmol/L (ref 135–145)
TOTAL PROTEIN: 6.8 g/dL (ref 6.5–8.1)

## 2019-02-06 MED ORDER — BORTEZOMIB CHEMO SQ INJECTION 3.5 MG (2.5MG/ML)
1.3000 mg/m2 | Freq: Once | INTRAMUSCULAR | Status: AC
  Administered 2019-02-06: 2.25 mg via SUBCUTANEOUS
  Filled 2019-02-06: qty 0.9

## 2019-02-06 MED ORDER — PROCHLORPERAZINE MALEATE 10 MG PO TABS: 10.0000 mg | ORAL_TABLET | Freq: Once | ORAL | Status: DC

## 2019-02-06 NOTE — Patient Instructions (Signed)
[facility] Discharge Instructions for Patients Receiving Chemotherapy  Today you received the following chemotherapy agents   To help prevent nausea and vomiting after your treatment, we encourage you to take your nausea medication   If you develop nausea and vomiting that is not controlled by your nausea medication, call the clinic.   BELOW ARE SYMPTOMS THAT SHOULD BE REPORTED IMMEDIATELY:  *FEVER GREATER THAN 100.5 F  *CHILLS WITH OR WITHOUT FEVER  NAUSEA AND VOMITING THAT IS NOT CONTROLLED WITH YOUR NAUSEA MEDICATION  *UNUSUAL SHORTNESS OF BREATH  *UNUSUAL BRUISING OR BLEEDING  TENDERNESS IN MOUTH AND THROAT WITH OR WITHOUT PRESENCE OF ULCERS  *URINARY PROBLEMS  *BOWEL PROBLEMS  UNUSUAL RASH Items with * indicate a potential emergency and should be followed up as soon as possible.  Feel free to call the clinic should you have any questions or concerns. The clinic phone number is [phone].  Please show the CHEMO ALERT CARD at check-in to the Emergency Department and triage nurse.   

## 2019-02-06 NOTE — Progress Notes (Signed)
Brittany Archer presents today for injection per MD orders. Velcade administered SQ in abdomen. Administration without incident. Patient tolerated well.  Velcade administered today per MD orders. Tolerated infusion without adverse affects. Vital signs stable. No complaints at this time. Discharged from clinic ambulatory. Pt given a new copy of appt scheduled and highlighted dates and times for patient. Understanding verbalized. F/U with [facility] as scheduled.

## 2019-02-13 ENCOUNTER — Inpatient Hospital Stay (HOSPITAL_COMMUNITY): Payer: Medicare Other

## 2019-02-13 ENCOUNTER — Encounter (HOSPITAL_COMMUNITY): Payer: Self-pay

## 2019-02-13 VITALS — BP 143/50 | HR 58 | Temp 98.7°F | Resp 18

## 2019-02-13 DIAGNOSIS — E538 Deficiency of other specified B group vitamins: Secondary | ICD-10-CM

## 2019-02-13 DIAGNOSIS — Z5112 Encounter for antineoplastic immunotherapy: Secondary | ICD-10-CM | POA: Diagnosis not present

## 2019-02-13 DIAGNOSIS — C9000 Multiple myeloma not having achieved remission: Secondary | ICD-10-CM

## 2019-02-13 MED ORDER — EPOETIN ALFA 20000 UNIT/ML IJ SOLN
INTRAMUSCULAR | Status: AC
  Filled 2019-02-13: qty 1

## 2019-02-13 MED ORDER — EPOETIN ALFA 20000 UNIT/ML IJ SOLN
20000.0000 [IU] | Freq: Once | INTRAMUSCULAR | Status: AC
  Administered 2019-02-13: 20000 [IU] via SUBCUTANEOUS

## 2019-02-13 NOTE — Patient Instructions (Signed)
[facility] at [facility]  Discharge Instructions:   _______________________________________________________________  Thank you for choosing [facility] at [facility] to provide your oncology and hematology care.  To afford each patient quality time with our providers, please arrive at least 15 minutes before your scheduled appointment.  You need to re-schedule your appointment if you arrive 10 or more minutes late.  We strive to give you quality time with our providers, and arriving late affects you and other patients whose appointments are after yours.  Also, if you no show three or more times for appointments you may be dismissed from the clinic.  Again, thank you for choosing [facility] at [facility]. Our hope is that these requests will allow you access to exceptional care and in a timely manner. _______________________________________________________________  If you have questions after your visit, please contact our office at [phone] between the hours of 8:30 a.m. and 5:00 p.m. Voicemails left after 4:30 p.m. will not be returned until the following business day. _______________________________________________________________  For prescription refill requests, have your pharmacy contact our office. _______________________________________________________________  Recommendations made by the consultant and any test results will be sent to your referring physician. _______________________________________________________________ 

## 2019-02-13 NOTE — Progress Notes (Signed)
Brittany Archer presents today for injection per MD orders. Procrit 20,000 units administered SQ in left Abdomen. Administration without incident. Patient tolerated well.  Treatment given per orders. Patient tolerated it well without problems. Vitals stable and discharged home from clinic ambulatory. Follow up as scheduled.

## 2019-02-17 ENCOUNTER — Other Ambulatory Visit (HOSPITAL_COMMUNITY): Payer: Self-pay | Admitting: Hematology

## 2019-02-20 ENCOUNTER — Inpatient Hospital Stay (HOSPITAL_COMMUNITY): Payer: Medicare Other

## 2019-02-20 ENCOUNTER — Other Ambulatory Visit: Payer: Self-pay

## 2019-02-20 ENCOUNTER — Encounter (HOSPITAL_COMMUNITY): Payer: Self-pay

## 2019-02-20 VITALS — BP 144/58 | HR 61 | Temp 98.8°F | Resp 18 | Wt 151.8 lb

## 2019-02-20 DIAGNOSIS — E538 Deficiency of other specified B group vitamins: Secondary | ICD-10-CM

## 2019-02-20 DIAGNOSIS — C9000 Multiple myeloma not having achieved remission: Secondary | ICD-10-CM

## 2019-02-20 DIAGNOSIS — Z5112 Encounter for antineoplastic immunotherapy: Secondary | ICD-10-CM | POA: Diagnosis not present

## 2019-02-20 LAB — COMPREHENSIVE METABOLIC PANEL
ALBUMIN: 3.7 g/dL (ref 3.5–5.0)
ALT: 5 U/L (ref 0–44)
AST: 9 U/L — ABNORMAL LOW (ref 15–41)
Alkaline Phosphatase: 50 U/L (ref 38–126)
Anion gap: 8 (ref 5–15)
BUN: 13 mg/dL (ref 8–23)
CALCIUM: 8.5 mg/dL — AB (ref 8.9–10.3)
CO2: 25 mmol/L (ref 22–32)
Chloride: 110 mmol/L (ref 98–111)
Creatinine, Ser: 1.08 mg/dL — ABNORMAL HIGH (ref 0.44–1.00)
GFR calc Af Amer: 57 mL/min — ABNORMAL LOW (ref >60)
GFR calc non Af Amer: 49 mL/min — ABNORMAL LOW (ref >60)
GLUCOSE: 64 mg/dL — AB (ref 70–99)
Potassium: 3 mmol/L — ABNORMAL LOW (ref 3.5–5.1)
Sodium: 143 mmol/L (ref 135–145)
Total Bilirubin: 0.6 mg/dL (ref 0.3–1.2)
Total Protein: 6.7 g/dL (ref 6.5–8.1)

## 2019-02-20 LAB — CBC WITH DIFFERENTIAL/PLATELET
ABS IMMATURE GRANULOCYTES: 0.02 10*3/uL (ref 0.00–0.07)
Basophils Absolute: 0 10*3/uL (ref 0.0–0.1)
Basophils Relative: 1 %
Eosinophils Absolute: 0.1 10*3/uL (ref 0.0–0.5)
Eosinophils Relative: 5 %
HEMATOCRIT: 32.8 % — AB (ref 36.0–46.0)
Hemoglobin: 10.3 g/dL — ABNORMAL LOW (ref 12.0–15.0)
Immature Granulocytes: 1 %
LYMPHS ABS: 0.8 10*3/uL (ref 0.7–4.0)
LYMPHS PCT: 33 %
MCH: 33.3 pg (ref 26.0–34.0)
MCHC: 31.4 g/dL (ref 30.0–36.0)
MCV: 106.1 fL — ABNORMAL HIGH (ref 80.0–100.0)
Monocytes Absolute: 0.3 10*3/uL (ref 0.1–1.0)
Monocytes Relative: 11 %
Neutro Abs: 1.2 10*3/uL — ABNORMAL LOW (ref 1.7–7.7)
Neutrophils Relative %: 49 %
Platelets: 211 10*3/uL (ref 150–400)
RBC: 3.09 MIL/uL — ABNORMAL LOW (ref 3.87–5.11)
RDW: 16.1 % — ABNORMAL HIGH (ref 11.5–15.5)
WBC: 2.5 10*3/uL — ABNORMAL LOW (ref 4.0–10.5)
nRBC: 0 % (ref 0.0–0.2)

## 2019-02-20 MED ORDER — BORTEZOMIB CHEMO SQ INJECTION 3.5 MG (2.5MG/ML)
1.3000 mg/m2 | Freq: Once | INTRAMUSCULAR | Status: AC
  Administered 2019-02-20: 2.25 mg via SUBCUTANEOUS
  Filled 2019-02-20: qty 0.9

## 2019-02-20 MED ORDER — PROCHLORPERAZINE MALEATE 10 MG PO TABS
10.0000 mg | ORAL_TABLET | Freq: Once | ORAL | Status: AC
  Administered 2019-02-20: 10 mg via ORAL

## 2019-02-20 MED ORDER — PROCHLORPERAZINE MALEATE 10 MG PO TABS
ORAL_TABLET | ORAL | Status: AC
  Filled 2019-02-20: qty 1

## 2019-02-20 MED ORDER — EPOETIN ALFA 20000 UNIT/ML IJ SOLN
20000.0000 [IU] | Freq: Once | INTRAMUSCULAR | Status: AC
  Administered 2019-02-20: 20000 [IU] via SUBCUTANEOUS
  Filled 2019-02-20: qty 1

## 2019-02-20 NOTE — Patient Instructions (Signed)

## 2019-02-20 NOTE — Progress Notes (Signed)
Labs reviewed with Dr. Walden Field today. ANC 1.2 noted by MD. Proceed with treatment.   Treatment given per orders. Patient tolerated it well without problems. Vitals stable and discharged home from clinic ambulatory. Follow up as scheduled.

## 2019-02-24 ENCOUNTER — Other Ambulatory Visit (HOSPITAL_COMMUNITY): Payer: Self-pay | Admitting: *Deleted

## 2019-02-24 DIAGNOSIS — C9000 Multiple myeloma not having achieved remission: Secondary | ICD-10-CM

## 2019-02-24 MED ORDER — LENALIDOMIDE 20 MG PO CAPS: ORAL_CAPSULE | ORAL | 0 refills | Status: DC

## 2019-02-24 NOTE — Telephone Encounter (Signed)
Chart reviewed, revlimid refilled. 

## 2019-02-27 ENCOUNTER — Encounter (HOSPITAL_COMMUNITY): Payer: Self-pay

## 2019-02-27 ENCOUNTER — Inpatient Hospital Stay (HOSPITAL_COMMUNITY): Payer: Medicare Other | Attending: Hematology

## 2019-02-27 ENCOUNTER — Inpatient Hospital Stay (HOSPITAL_COMMUNITY): Payer: Medicare Other

## 2019-02-27 VITALS — BP 139/52 | HR 55 | Temp 97.9°F | Resp 18 | Wt 153.0 lb

## 2019-02-27 DIAGNOSIS — C9000 Multiple myeloma not having achieved remission: Secondary | ICD-10-CM

## 2019-02-27 DIAGNOSIS — Z5112 Encounter for antineoplastic immunotherapy: Secondary | ICD-10-CM | POA: Insufficient documentation

## 2019-02-27 DIAGNOSIS — E538 Deficiency of other specified B group vitamins: Secondary | ICD-10-CM

## 2019-02-27 LAB — CBC WITH DIFFERENTIAL/PLATELET
Abs Immature Granulocytes: 0.02 10*3/uL (ref 0.00–0.07)
BASOS ABS: 0 10*3/uL (ref 0.0–0.1)
Basophils Relative: 1 %
Eosinophils Absolute: 0.1 10*3/uL (ref 0.0–0.5)
Eosinophils Relative: 1 %
HCT: 36.2 % (ref 36.0–46.0)
Hemoglobin: 11.1 g/dL — ABNORMAL LOW (ref 12.0–15.0)
IMMATURE GRANULOCYTES: 1 %
Lymphocytes Relative: 22 %
Lymphs Abs: 0.9 10*3/uL (ref 0.7–4.0)
MCH: 32.7 pg (ref 26.0–34.0)
MCHC: 30.7 g/dL (ref 30.0–36.0)
MCV: 106.8 fL — ABNORMAL HIGH (ref 80.0–100.0)
Monocytes Absolute: 0.4 10*3/uL (ref 0.1–1.0)
Monocytes Relative: 9 %
NRBC: 0 % (ref 0.0–0.2)
Neutro Abs: 2.8 10*3/uL (ref 1.7–7.7)
Neutrophils Relative %: 66 %
Platelets: 198 10*3/uL (ref 150–400)
RBC: 3.39 MIL/uL — AB (ref 3.87–5.11)
RDW: 16 % — ABNORMAL HIGH (ref 11.5–15.5)
WBC: 4.2 10*3/uL (ref 4.0–10.5)

## 2019-02-27 LAB — COMPREHENSIVE METABOLIC PANEL
ALT: 7 U/L (ref 0–44)
AST: 8 U/L — ABNORMAL LOW (ref 15–41)
Albumin: 3.9 g/dL (ref 3.5–5.0)
Alkaline Phosphatase: 55 U/L (ref 38–126)
Anion gap: 8 (ref 5–15)
BUN: 20 mg/dL (ref 8–23)
CO2: 25 mmol/L (ref 22–32)
Calcium: 9.4 mg/dL (ref 8.9–10.3)
Chloride: 106 mmol/L (ref 98–111)
Creatinine, Ser: 1.01 mg/dL — ABNORMAL HIGH (ref 0.44–1.00)
GFR calc Af Amer: >60 mL/min (ref >60)
GFR calc non Af Amer: 53 mL/min — ABNORMAL LOW (ref >60)
Glucose, Bld: 74 mg/dL (ref 70–99)
Potassium: 3.2 mmol/L — ABNORMAL LOW (ref 3.5–5.1)
SODIUM: 139 mmol/L (ref 135–145)
Total Bilirubin: 0.5 mg/dL (ref 0.3–1.2)
Total Protein: 6.8 g/dL (ref 6.5–8.1)

## 2019-02-27 LAB — LACTATE DEHYDROGENASE: LDH: 132 U/L (ref 98–192)

## 2019-02-27 MED ORDER — DENOSUMAB 120 MG/1.7ML ~~LOC~~ SOLN
120.0000 mg | Freq: Once | SUBCUTANEOUS | Status: AC
  Administered 2019-02-27: 120 mg via SUBCUTANEOUS
  Filled 2019-02-27: qty 1.7

## 2019-02-27 MED ORDER — PROCHLORPERAZINE MALEATE 10 MG PO TABS
ORAL_TABLET | ORAL | Status: AC
  Filled 2019-02-27: qty 1

## 2019-02-27 MED ORDER — BORTEZOMIB CHEMO SQ INJECTION 3.5 MG (2.5MG/ML)
1.3000 mg/m2 | Freq: Once | INTRAMUSCULAR | Status: AC
  Administered 2019-02-27: 2.25 mg via SUBCUTANEOUS
  Filled 2019-02-27: qty 0.9

## 2019-02-27 MED ORDER — PROCHLORPERAZINE MALEATE 10 MG PO TABS
10.0000 mg | ORAL_TABLET | Freq: Once | ORAL | Status: AC
  Administered 2019-02-27: 10 mg via ORAL

## 2019-02-27 NOTE — Progress Notes (Signed)
[place] reviewed prior to administering Velcade and Xgeva injections Calcium 9.4 today and pt denied any tooth or jaw pain and no recent or future dental visits. Pt continues to take her Revlimid,Calcium and Decadron medications as prescribed without any issues. Hgb 11.1 today so Procrit injection will be held today per parameters                                                        Brittany Archer tolerated Velcade and Xgeva injections well without complaints or incident. VSS Pt discharged self ambulatory in satisfactory condition accompanied by family member

## 2019-02-27 NOTE — Patient Instructions (Signed)
[facility] Discharge Instructions for Patients Receiving Chemotherapy   Beginning January 23rd 2017 lab work for the [facility] will be done in the  Main lab at [facility] on 1st floor. If you have a lab appointment with the [facility] please come in thru the  Main Entrance and check in at the main information desk   Today you received the following chemotherapy agents Velcade injection as well as  Xgeva injection. Follow-up as scheduled. Call clinic for any questions or concerns To help prevent nausea and vomiting after your treatment, we encourage you to take your nausea medication   If you develop nausea and vomiting, or diarrhea that is not controlled by your medication, call the clinic.  The clinic phone number is [phone]. Office hours are Monday-Friday 8:30am-5:00pm.  BELOW ARE SYMPTOMS THAT SHOULD BE REPORTED IMMEDIATELY:  *FEVER GREATER THAN 101.0 F  *CHILLS WITH OR WITHOUT FEVER  NAUSEA AND VOMITING THAT IS NOT CONTROLLED WITH YOUR NAUSEA MEDICATION  *UNUSUAL SHORTNESS OF BREATH  *UNUSUAL BRUISING OR BLEEDING  TENDERNESS IN MOUTH AND THROAT WITH OR WITHOUT PRESENCE OF ULCERS  *URINARY PROBLEMS  *BOWEL PROBLEMS  UNUSUAL RASH Items with * indicate a potential emergency and should be followed up as soon as possible. If you have an emergency after office hours please contact your primary care physician or go to the nearest emergency department.  Please call the clinic during office hours if you have any questions or concerns.   You may also contact the Patient Navigator at [phone] should you have any questions or need assistance in obtaining follow up care.      Resources For Cancer Patients and their Caregivers ? American Cancer Society: Can assist with transportation, wigs, general needs, runs Look Good Feel Better.        ([phone] ? Cancer Care: Provides financial assistance, online support groups, medication/co-pay  assistance.  1-800-813-HOPE (937) 769-4873) ? [place] Assists [place] cancer patients and their families through emotional , educational and financial support.  325-318-1375 ? [place] DSS Where to apply for food stamps, Medicaid and utility assistance. 2721436376 ? [employer]: Transportation to medical appointments. 814-724-7939 ? Social Security Administration: May apply for disability if have a Stage IV cancer. [phone] ? [employer], Disability and Transit Services: Assists with nutrition, care and transit needs. [phone]

## 2019-02-28 LAB — IMMUNOFIXATION ELECTROPHORESIS
IGA: 340 mg/dL (ref 64–422)
IgG (Immunoglobin G), Serum: 946 mg/dL (ref 700–1600)
IgM (Immunoglobulin M), Srm: 78 mg/dL (ref 26–217)
Total Protein ELP: 6.4 g/dL (ref 6.0–8.5)

## 2019-02-28 LAB — KAPPA/LAMBDA LIGHT CHAINS
Kappa free light chain: 30.1 mg/L — ABNORMAL HIGH (ref 3.3–19.4)
Kappa, lambda light chain ratio: 1.7 — ABNORMAL HIGH (ref 0.26–1.65)
Lambda free light chains: 17.7 mg/L (ref 5.7–26.3)

## 2019-03-01 LAB — PROTEIN ELECTROPHORESIS, SERUM
A/G Ratio: 1.3 (ref 0.7–1.7)
ALBUMIN ELP: 3.6 g/dL (ref 2.9–4.4)
Alpha-1-Globulin: 0.2 g/dL (ref 0.0–0.4)
Alpha-2-Globulin: 0.8 g/dL (ref 0.4–1.0)
Beta Globulin: 0.9 g/dL (ref 0.7–1.3)
Gamma Globulin: 0.8 g/dL (ref 0.4–1.8)
Globulin, Total: 2.8 g/dL (ref 2.2–3.9)
M-Spike, %: 0.2 g/dL — ABNORMAL HIGH
Total Protein ELP: 6.4 g/dL (ref 6.0–8.5)

## 2019-03-06 ENCOUNTER — Other Ambulatory Visit: Payer: Self-pay

## 2019-03-06 ENCOUNTER — Inpatient Hospital Stay (HOSPITAL_COMMUNITY): Payer: Medicare Other

## 2019-03-06 ENCOUNTER — Encounter (HOSPITAL_COMMUNITY): Payer: Self-pay | Admitting: Hematology

## 2019-03-06 ENCOUNTER — Inpatient Hospital Stay (HOSPITAL_BASED_OUTPATIENT_CLINIC_OR_DEPARTMENT_OTHER): Payer: Medicare Other | Admitting: Hematology

## 2019-03-06 VITALS — BP 125/52 | HR 70 | Temp 98.0°F | Resp 18 | Wt 154.0 lb

## 2019-03-06 DIAGNOSIS — Z5112 Encounter for antineoplastic immunotherapy: Secondary | ICD-10-CM | POA: Diagnosis not present

## 2019-03-06 DIAGNOSIS — E876 Hypokalemia: Secondary | ICD-10-CM | POA: Diagnosis not present

## 2019-03-06 DIAGNOSIS — C9000 Multiple myeloma not having achieved remission: Secondary | ICD-10-CM | POA: Diagnosis not present

## 2019-03-06 LAB — CBC WITH DIFFERENTIAL/PLATELET
Abs Immature Granulocytes: 0.01 10*3/uL (ref 0.00–0.07)
Basophils Absolute: 0 10*3/uL (ref 0.0–0.1)
Basophils Relative: 0 %
Eosinophils Absolute: 0.1 10*3/uL (ref 0.0–0.5)
Eosinophils Relative: 2 %
HCT: 36.1 % (ref 36.0–46.0)
Hemoglobin: 11.2 g/dL — ABNORMAL LOW (ref 12.0–15.0)
Immature Granulocytes: 0 %
Lymphocytes Relative: 19 %
Lymphs Abs: 0.9 10*3/uL (ref 0.7–4.0)
MCH: 33.2 pg (ref 26.0–34.0)
MCHC: 31 g/dL (ref 30.0–36.0)
MCV: 107.1 fL — AB (ref 80.0–100.0)
Monocytes Absolute: 0.4 10*3/uL (ref 0.1–1.0)
Monocytes Relative: 8 %
Neutro Abs: 3.3 10*3/uL (ref 1.7–7.7)
Neutrophils Relative %: 71 %
Platelets: 124 10*3/uL — ABNORMAL LOW (ref 150–400)
RBC: 3.37 MIL/uL — ABNORMAL LOW (ref 3.87–5.11)
RDW: 16.2 % — ABNORMAL HIGH (ref 11.5–15.5)
WBC: 4.7 10*3/uL (ref 4.0–10.5)
nRBC: 0 % (ref 0.0–0.2)

## 2019-03-06 LAB — COMPREHENSIVE METABOLIC PANEL
ALT: 7 U/L (ref 0–44)
AST: 9 U/L — ABNORMAL LOW (ref 15–41)
Albumin: 3.7 g/dL (ref 3.5–5.0)
Alkaline Phosphatase: 51 U/L (ref 38–126)
Anion gap: 7 (ref 5–15)
BUN: 27 mg/dL — ABNORMAL HIGH (ref 8–23)
CO2: 28 mmol/L (ref 22–32)
Calcium: 9.7 mg/dL (ref 8.9–10.3)
Chloride: 106 mmol/L (ref 98–111)
Creatinine, Ser: 0.96 mg/dL (ref 0.44–1.00)
GFR calc Af Amer: >60 mL/min (ref >60)
GFR, EST NON AFRICAN AMERICAN: 57 mL/min — AB (ref >60)
Glucose, Bld: 55 mg/dL — ABNORMAL LOW (ref 70–99)
Potassium: 3.8 mmol/L (ref 3.5–5.1)
Sodium: 141 mmol/L (ref 135–145)
Total Bilirubin: 1 mg/dL (ref 0.3–1.2)
Total Protein: 6.6 g/dL (ref 6.5–8.1)

## 2019-03-06 MED ORDER — PROCHLORPERAZINE MALEATE 10 MG PO TABS
10.0000 mg | ORAL_TABLET | Freq: Once | ORAL | Status: AC
  Administered 2019-03-06: 10 mg via ORAL
  Filled 2019-03-06: qty 1

## 2019-03-06 MED ORDER — BORTEZOMIB CHEMO SQ INJECTION 3.5 MG (2.5MG/ML)
1.3000 mg/m2 | Freq: Once | INTRAMUSCULAR | Status: AC
  Administered 2019-03-06: 2.25 mg via SUBCUTANEOUS
  Filled 2019-03-06: qty 0.9

## 2019-03-06 NOTE — Progress Notes (Signed)
Patient tolerated injection with no complaints voiced.  Site clean and dry with no bruising or swelling noted at site.  Band aid applied.  Vss with discharge and left ambulatory with no s/s of distress noted.  

## 2019-03-06 NOTE — Patient Instructions (Signed)
[facility] Discharge Instructions for Patients Receiving Chemotherapy  Today you received the following chemotherapy agents  If you develop nausea and vomiting that is not controlled by your nausea medication, call the clinic.   BELOW ARE SYMPTOMS THAT SHOULD BE REPORTED IMMEDIATELY:  *FEVER GREATER THAN 100.5 F  *CHILLS WITH OR WITHOUT FEVER  NAUSEA AND VOMITING THAT IS NOT CONTROLLED WITH YOUR NAUSEA MEDICATION  *UNUSUAL SHORTNESS OF BREATH  *UNUSUAL BRUISING OR BLEEDING  TENDERNESS IN MOUTH AND THROAT WITH OR WITHOUT PRESENCE OF ULCERS  *URINARY PROBLEMS  *BOWEL PROBLEMS  UNUSUAL RASH Items with * indicate a potential emergency and should be followed up as soon as possible.  Feel free to call the clinic should you have any questions or concerns. The clinic phone number is [phone].  Please show the CHEMO ALERT CARD at check-in to the Emergency Department and triage nurse.   

## 2019-03-06 NOTE — Progress Notes (Signed)
[place] [place], [place] [address]   CLINIC:  Medical Oncology/Hematology  PCP:  Vesta Mixer [address] [place] [address] [phone]   REASON FOR VISIT: Follow-up for multiple myeloma  CURRENT THERAPY:Valcade, Revlimd, and dexamethasone every 3 weeks   BRIEF ONCOLOGIC HISTORY:    Multiple myeloma not having achieved remission ([place])   01/20/2018 Initial Diagnosis    Multiple myeloma not having achieved remission ([place])    01/26/2018 -  Chemotherapy    The patient had bortezomib SQ (VELCADE) chemo injection 2.5 mg, 1.3 mg/m2 = 2.5 mg, Subcutaneous,  Once, 15 of 16 cycles Administration: 2.5 mg (01/26/2018), 2.5 mg (02/02/2018), 2.5 mg (02/09/2018), 2.5 mg (02/16/2018), 2.5 mg (02/23/2018), 2.5 mg (03/02/2018), 2.5 mg (03/09/2018), 2.5 mg (03/30/2018), 2.5 mg (04/06/2018), 2.5 mg (04/13/2018), 2.5 mg (04/21/2018), 2.5 mg (05/06/2018), 2.5 mg (05/11/2018), 2.5 mg (05/20/2018), 2.5 mg (05/27/2018), 2.5 mg (06/10/2018), 2.5 mg (06/17/2018), 2.5 mg (06/24/2018), 2.5 mg (07/08/2018), 2.5 mg (07/15/2018), 2.5 mg (07/22/2018), 2.5 mg (08/05/2018), 2.5 mg (08/12/2018), 2.5 mg (09/02/2018), 2.5 mg (09/09/2018), 2.5 mg (09/16/2018), 2.5 mg (09/30/2018), 2.5 mg (10/07/2018), 2.5 mg (10/14/2018), 2.5 mg (10/31/2018), 2.5 mg (11/07/2018), 2.5 mg (11/14/2018), 2.5 mg (11/28/2018), 2.25 mg (12/05/2018), 2.25 mg (12/12/2018), 2.25 mg (12/26/2018), 2.25 mg (01/02/2019), 2.25 mg (01/09/2019), 2.25 mg (01/23/2019), 2.25 mg (01/30/2019), 2.25 mg (02/06/2019), 2.25 mg (02/20/2019), 2.25 mg (02/27/2019), 2.25 mg (03/06/2019)  for chemotherapy treatment.       INTERVAL HISTORY:  Ms. Peyser 78 y.o. female returns for routine follow-up multiple myeloma. She reports she has been doing well since her last visit. She has been taking her revlimid as prescribed with no complications. Denies any nausea, vomiting, or diarrhea. Denies any new pains. Had not noticed any recent bleeding such as epistaxis,  hematuria or hematochezia. Denies recent chest pain on exertion, shortness of breath on minimal exertion, pre-syncopal episodes, or palpitations. Denies any numbness or tingling in hands or feet. Denies any recent fevers, infections, or recent hospitalizations. Patient reports appetite at 100% and energy level at 75%. She is eating well and maintaining her weight at this time.    REVIEW OF SYSTEMS:  Review of Systems  All other systems reviewed and are negative.    PAST MEDICAL/SURGICAL HISTORY:  Past Medical History:  Diagnosis Date  . Breast cancer ([place])    left breast/ 2008/ surg/ rad tx  . Coronary artery disease   . Diabetes mellitus    Past Surgical History:  Procedure Laterality Date  . ABDOMINAL HYSTERECTOMY    . BREAST SURGERY       SOCIAL HISTORY:  Social History   Socioeconomic History  . Marital status: Divorced    Spouse name: Not on file  . Number of children: Not on file  . Years of education: Not on file  . Highest education level: Not on file  Occupational History  . Not on file  Social Needs  . Financial resource strain: Not on file  . Food insecurity:    Worry: Not on file    Inability: Not on file  . Transportation needs:    Medical: Not on file    Non-medical: Not on file  Tobacco Use  . Smoking status: Never Smoker  . Smokeless tobacco: Never Used  Substance and Sexual Activity  . Alcohol use: No  . Drug use: No  . Sexual activity: Yes    Birth control/protection: Surgical  Lifestyle  . Physical activity:  Days per week: Not on file    Minutes per session: Not on file  . Stress: Not on file  Relationships  . Social connections:    Talks on phone: Not on file    Gets together: Not on file    Attends religious service: Not on file    Active member of club or organization: Not on file    Attends meetings of clubs or organizations: Not on file    Relationship status: Not on file  . Intimate partner violence:    Fear of current or ex  partner: Not on file    Emotionally abused: Not on file    Physically abused: Not on file    Forced sexual activity: Not on file  Other Topics Concern  . Not on file  Social History Narrative  . Not on file    FAMILY HISTORY:  Family History  Problem Relation Age of Onset  . Obesity Sister     CURRENT MEDICATIONS:  Outpatient Encounter Medications as of 03/06/2019  Medication Sig  . acetaminophen (TYLENOL) 500 MG tablet Take 500 mg by mouth every 6 (six) hours as needed for mild pain or moderate pain.  [place] acyclovir (ZOVIRAX) 400 MG tablet Take 1 tablet (400 mg total) by mouth 2 (two) times daily.  Mar[place] Kitchen aspirin 81 MG tablet Take 81 mg by mouth daily.    . bortezomib IV (VELCADE) 3.5 MG injection Inject into the vein once. weekly  . cholecalciferol (VITAMIN D) 1000 units tablet Take 1,000 Units by mouth daily.  . Denosumab (XGEVA [place]) Inject into the skin. Every 28 days  . dexamethasone (DECADRON) 4 MG tablet Take 10 tablets (40 mg) on days 1, 8, and 15 of chemo. Repeat every 21 days.  Marland Kitchen glipiZIDE (GLUCOTROL) 5 MG tablet Take 5 mg by mouth daily before breakfast.   . lenalidomide (REVLIMID) 20 MG capsule Take one capsule daily on days 1-14 every 21 days.  Marland Kitchen lisinopril-hydrochlorothiazide (PRINZIDE,ZESTORETIC) 20-25 MG tablet Take 1 tablet by mouth daily.   . metFORMIN (GLUCOPHAGE) 1000 MG tablet Take 1,000 mg by mouth 2 times daily at 12 noon and 4 pm.    . ondansetron (ZOFRAN) 8 MG tablet Take 1 tablet (8 mg total) by mouth 2 (two) times daily as needed (Nausea or vomiting).  . potassium chloride SA (K-DUR,KLOR-CON) 20 MEQ tablet Take 1 tablet (20 mEq total) by mouth 3 (three) times daily.  . prochlorperazine (COMPAZINE) 10 MG tablet Take 1 tablet (10 mg total) by mouth every 6 (six) hours as needed (Nausea or vomiting).   No facility-administered encounter medications on file as of 03/06/2019.     ALLERGIES:  Allergies  Allergen Reactions  . Motrin [Ibuprofen] Rash     PHYSICAL  EXAM:  ECOG Performance status: 1  Vitals:   03/06/19 1029  BP: (!) 125/52  Pulse: 70  Resp: 18  Temp: 98 F (36.7 C)  SpO2: 100%   Filed Weights   03/06/19 1029  Weight: 154 lb (69.9 kg)    Physical Exam Constitutional:      Appearance: Normal appearance. She is normal weight.  Musculoskeletal: Normal range of motion.  Skin:    General: Skin is warm and dry.  Neurological:     Mental Status: She is alert and oriented to person, place, and time. Mental status is at baseline.  Psychiatric:        Mood and Affect: Mood normal.        Behavior: Behavior normal.  Thought Content: Thought content normal.        Judgment: Judgment normal.   Chest: Bilateral clear to auscultation CVS: Sinus regular rate and rhythm.   LABORATORY DATA:  I have reviewed the labs as listed.  CBC    Component Value Date/Time   WBC 4.7 03/06/2019 0959   RBC 3.37 (L) 03/06/2019 0959   HGB 11.2 (L) 03/06/2019 0959   HCT 36.1 03/06/2019 0959   PLT 124 (L) 03/06/2019 0959   MCV 107.1 (H) 03/06/2019 0959   MCH 33.2 03/06/2019 0959   MCHC 31.0 03/06/2019 0959   RDW 16.2 (H) 03/06/2019 0959   LYMPHSABS 0.9 03/06/2019 0959   MONOABS 0.4 03/06/2019 0959   EOSABS 0.1 03/06/2019 0959   BASOSABS 0.0 03/06/2019 0959   CMP Latest Ref Rng & Units 03/06/2019 02/27/2019 02/20/2019  Glucose 70 - 99 mg/dL 55(L) 74 64(L)  BUN 8 - 23 mg/dL 27(H) 20 13  Creatinine 0.44 - 1.00 mg/dL 0.96 1.01(H) 1.08(H)  Sodium 135 - 145 mmol/L 141 139 143  Potassium 3.5 - 5.1 mmol/L 3.8 3.2(L) 3.0(L)  Chloride 98 - 111 mmol/L 106 106 110  CO2 22 - 32 mmol/L '28 25 25  ' Calcium 8.9 - 10.3 mg/dL 9.7 9.4 8.5(L)  Total Protein 6.5 - 8.1 g/dL 6.6 6.8 6.7  Total Bilirubin 0.3 - 1.2 mg/dL 1.0 0.5 0.6  Alkaline Phos 38 - 126 U/L 51 55 50  AST 15 - 41 U/L 9(L) 8(L) 9(L)  ALT 0 - 44 U/L '7 7 5       ' DIAGNOSTIC IMAGING:  I have independently reviewed the scans and discussed with the patient.   I have reviewed Francene Finders, NP's note and agree with the documentation.  I personally performed a face-to-face visit, made revisions and my assessment and plan is as follows.    ASSESSMENT & PLAN:   Multiple myeloma not having achieved remission ([place]) 1.  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: -Bone marrow biopsy on 01/03/2018 with 60% plasma cells, FISH panel with no abnormalities, chromosome analysis showing hyperdiploidy with gains of chromosomes 2, 3, 4, 7, 10, 15, beta-2 microglobulin 3.2, LDH normal, 0.9 g/dL of M spike at diagnosis, free light chain ratio of 114, Kappa Light chain of 1796 -Skeletal survey on 02/16/2018 showing subtle patchy areas of osteopenia of the thoracolumbar spine and possibly distal right clavicle which may reflect subtle changes of multiple myeloma - RVD (Velcade on days 1, 8 and 15, Revlimid 25 on days 1-21, dexamethasone 10 pills on day of Velcade every 28 days) cycle 1 started on 01/26/2018, cycle 4 on 04/13/2018, she is asynchronous with Revlimid and Velcade.  We have held her Revlimid once when [place] dropped to 600 during cycle 4. - Revlimid was dose reduced to 20 mg because of cytopenias.  - She is continuing to tolerate Velcade weekly without any side effects.  Denies any tingling or numbness in extremities. -She is taking dexamethasone 40 mg weekly.  She is also taking Revlimid 20 mg, 2 weeks on 1 week off. -We reviewed myeloma blood work from 02/27/2019.  M spike was 0.2 g.  Kappa light chains also improved to 30.1.  Free light chain ratio was 1.7. - I will reevaluate her in 4 weeks.  We plan to discontinue Velcade if her SPEP and immunofixation normalize.  2.  Bone strengthening: -She is continuing monthly denosumab. -She will continue calcium and vitamin D twice daily.   3.  Hypokalemia: -  Potassium is normal today.  She will continue potassium supplements.       Orders placed this encounter:  Orders Placed This Encounter  Procedures  . CBC with  Differential/Platelet  . Comprehensive metabolic panel  . Draw extra clot tube      Derek Jack, MD [place] [phone]

## 2019-03-06 NOTE — Patient Instructions (Addendum)
[facility] at [facility] Discharge Instructions  Follow up in 4 weeks with labs    Thank you for choosing  Cancer Center at [facility] to provide your oncology and hematology care.  To afford each patient quality time with our provider, please arrive at least 15 minutes before your scheduled appointment time.   If you have a lab appointment with the Cancer Center please come in thru the  Main Entrance and check in at the main information desk  You need to re-schedule your appointment should you arrive 10 or more minutes late.  We strive to give you quality time with our providers, and arriving late affects you and other patients whose appointments are after yours.  Also, if you no show three or more times for appointments you may be dismissed from the clinic at the providers discretion.     Again, thank you for choosing [facility].  Our hope is that these requests will decrease the amount of time that you wait before being seen by our physicians.       _____________________________________________________________  Should you have questions after your visit to [facility], please contact our office at [phone] between the hours of 8:00 a.m. and 4:30 p.m.  Voicemails left after 4:00 p.m. will not be returned until the following business day.  For prescription refill requests, have your pharmacy contact our office and allow 72 hours.    Cancer Center Support Programs:   > Cancer Support Group  2nd Tuesday of the month 1pm-2pm, Journey Room    

## 2019-03-06 NOTE — Assessment & Plan Note (Signed)
1.  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: -Bone marrow biopsy on 01/03/2018 with 60% plasma cells, FISH panel with no abnormalities, chromosome analysis showing hyperdiploidy with gains of chromosomes 2, 3, 4, 7, 10, 15, beta-2 microglobulin 3.2, LDH normal, 0.9 g/dL of M spike at diagnosis, free light chain ratio of 114, Kappa Light chain of 1796 -Skeletal survey on 02/16/2018 showing subtle patchy areas of osteopenia of the thoracolumbar spine and possibly distal right clavicle which may reflect subtle changes of multiple myeloma - RVD (Velcade on days 1, 8 and 15, Revlimid 25 on days 1-21, dexamethasone 10 pills on day of Velcade every 28 days) cycle 1 started on 01/26/2018, cycle 4 on 04/13/2018, she is asynchronous with Revlimid and Velcade.  We have held her Revlimid once when [place] dropped to 600 during cycle 4. - Revlimid was dose reduced to 20 mg because of cytopenias.  - She is continuing to tolerate Velcade weekly without any side effects.  Denies any tingling or numbness in extremities. -She is taking dexamethasone 40 mg weekly.  She is also taking Revlimid 20 mg, 2 weeks on 1 week off. -We reviewed myeloma blood work from 02/27/2019.  M spike was 0.2 g.  Kappa light chains also improved to 30.1.  Free light chain ratio was 1.7. - I will reevaluate her in 4 weeks.  We plan to discontinue Velcade if her SPEP and immunofixation normalize.  2.  Bone strengthening: -She is continuing monthly denosumab. -She will continue calcium and vitamin D twice daily.   3.  Hypokalemia: -Potassium is normal today.  She will continue potassium supplements.

## 2019-03-20 ENCOUNTER — Inpatient Hospital Stay (HOSPITAL_COMMUNITY): Payer: Medicare Other

## 2019-03-20 ENCOUNTER — Other Ambulatory Visit: Payer: Self-pay

## 2019-03-20 VITALS — BP 153/62 | HR 57 | Temp 98.0°F | Resp 18 | Wt 160.4 lb

## 2019-03-20 DIAGNOSIS — E538 Deficiency of other specified B group vitamins: Secondary | ICD-10-CM

## 2019-03-20 DIAGNOSIS — Z5112 Encounter for antineoplastic immunotherapy: Secondary | ICD-10-CM | POA: Diagnosis not present

## 2019-03-20 DIAGNOSIS — C9000 Multiple myeloma not having achieved remission: Secondary | ICD-10-CM

## 2019-03-20 LAB — CBC WITH DIFFERENTIAL/PLATELET
Abs Immature Granulocytes: 0.01 10*3/uL (ref 0.00–0.07)
Basophils Absolute: 0 10*3/uL (ref 0.0–0.1)
Basophils Relative: 1 %
EOS ABS: 0.1 10*3/uL (ref 0.0–0.5)
Eosinophils Relative: 2 %
HCT: 36.7 % (ref 36.0–46.0)
Hemoglobin: 11.3 g/dL — ABNORMAL LOW (ref 12.0–15.0)
Immature Granulocytes: 0 %
LYMPHS ABS: 1 10*3/uL (ref 0.7–4.0)
Lymphocytes Relative: 27 %
MCH: 33.4 pg (ref 26.0–34.0)
MCHC: 30.8 g/dL (ref 30.0–36.0)
MCV: 108.6 fL — ABNORMAL HIGH (ref 80.0–100.0)
Monocytes Absolute: 0.4 10*3/uL (ref 0.1–1.0)
Monocytes Relative: 11 %
NRBC: 0 % (ref 0.0–0.2)
Neutro Abs: 2.1 10*3/uL (ref 1.7–7.7)
Neutrophils Relative %: 59 %
Platelets: 209 10*3/uL (ref 150–400)
RBC: 3.38 MIL/uL — ABNORMAL LOW (ref 3.87–5.11)
RDW: 14.9 % (ref 11.5–15.5)
WBC: 3.6 10*3/uL — ABNORMAL LOW (ref 4.0–10.5)

## 2019-03-20 LAB — COMPREHENSIVE METABOLIC PANEL
ALT: 9 U/L (ref 0–44)
AST: 10 U/L — ABNORMAL LOW (ref 15–41)
Albumin: 4 g/dL (ref 3.5–5.0)
Alkaline Phosphatase: 51 U/L (ref 38–126)
Anion gap: 9 (ref 5–15)
BILIRUBIN TOTAL: 0.5 mg/dL (ref 0.3–1.2)
BUN: 24 mg/dL — ABNORMAL HIGH (ref 8–23)
CO2: 26 mmol/L (ref 22–32)
Calcium: 9.3 mg/dL (ref 8.9–10.3)
Chloride: 108 mmol/L (ref 98–111)
Creatinine, Ser: 1.02 mg/dL — ABNORMAL HIGH (ref 0.44–1.00)
GFR calc Af Amer: >60 mL/min (ref >60)
GFR calc non Af Amer: 53 mL/min — ABNORMAL LOW (ref >60)
Glucose, Bld: 90 mg/dL (ref 70–99)
POTASSIUM: 4.1 mmol/L (ref 3.5–5.1)
Sodium: 143 mmol/L (ref 135–145)
TOTAL PROTEIN: 7.2 g/dL (ref 6.5–8.1)

## 2019-03-20 MED ORDER — CYANOCOBALAMIN 1000 MCG/ML IJ SOLN
1000.0000 ug | Freq: Once | INTRAMUSCULAR | Status: AC
  Administered 2019-03-20: 1000 ug via INTRAMUSCULAR
  Filled 2019-03-20: qty 1

## 2019-03-20 MED ORDER — BORTEZOMIB CHEMO SQ INJECTION 3.5 MG (2.5MG/ML)
1.3000 mg/m2 | Freq: Once | INTRAMUSCULAR | Status: AC
  Administered 2019-03-20: 2.25 mg via SUBCUTANEOUS
  Filled 2019-03-20: qty 0.9

## 2019-03-20 MED ORDER — PROCHLORPERAZINE MALEATE 10 MG PO TABS
10.0000 mg | ORAL_TABLET | Freq: Once | ORAL | Status: AC
  Administered 2019-03-20: 10 mg via ORAL
  Filled 2019-03-20: qty 1

## 2019-03-20 NOTE — Patient Instructions (Signed)
[facility] Discharge Instructions for Patients Receiving Chemotherapy   Beginning January 23rd 2017 lab work for the [facility] will be done in the  Main lab at [facility] on 1st floor. If you have a lab appointment with the [facility] please come in thru the  Main Entrance and check in at the main information desk  Today you received the following chemotherapy agents Velcade and a B12 injection  To help prevent nausea and vomiting after your treatment, we encourage you to take your nausea medication   If you develop nausea and vomiting, or diarrhea that is not controlled by your medication, call the clinic.  The clinic phone number is [phone]. Office hours are Monday-Friday 8:30am-5:00pm.  BELOW ARE SYMPTOMS THAT SHOULD BE REPORTED IMMEDIATELY:  *FEVER GREATER THAN 101.0 F  *CHILLS WITH OR WITHOUT FEVER  NAUSEA AND VOMITING THAT IS NOT CONTROLLED WITH YOUR NAUSEA MEDICATION  *UNUSUAL SHORTNESS OF BREATH  *UNUSUAL BRUISING OR BLEEDING  TENDERNESS IN MOUTH AND THROAT WITH OR WITHOUT PRESENCE OF ULCERS  *URINARY PROBLEMS  *BOWEL PROBLEMS  UNUSUAL RASH Items with * indicate a potential emergency and should be followed up as soon as possible. If you have an emergency after office hours please contact your primary care physician or go to the nearest emergency department.  Please call the clinic during office hours if you have any questions or concerns.   You may also contact the Patient Navigator at [phone] should you have any questions or need assistance in obtaining follow up care.      Resources For Cancer Patients and their Caregivers ? American Cancer Society: Can assist with transportation, wigs, general needs, runs Look Good Feel Better.        5057950279 ? Cancer Care: Provides financial assistance, online support groups, medication/co-pay assistance.  1-800-813-HOPE 669 081 7608) ? [place] Assists  [place] cancer patients and their families through emotional , educational and financial support.  617-468-6827 ? [place] DSS Where to apply for food stamps, Medicaid and utility assistance. (562) 175-0475 ? [employer]: Transportation to medical appointments. 670 293 3247 ? Social Security Administration: May apply for disability if have a Stage IV cancer. [phone] ? [employer], Disability and Transit Services: Assists with nutrition, care and transit needs. [phone]

## 2019-03-20 NOTE — Progress Notes (Signed)
Brittany Archer tolerated Velcade and B12 injection without incident or complaint. VSS. Procrit held for hgb 11.3. Pt reports she is taking her Revlimid as instructed. Discharged self ambulatory in satisfactory condition.

## 2019-03-27 ENCOUNTER — Inpatient Hospital Stay (HOSPITAL_COMMUNITY): Payer: Medicare Other

## 2019-03-27 ENCOUNTER — Other Ambulatory Visit: Payer: Self-pay

## 2019-03-27 VITALS — BP 130/65 | HR 67 | Temp 98.4°F | Resp 18 | Wt 161.2 lb

## 2019-03-27 DIAGNOSIS — E538 Deficiency of other specified B group vitamins: Secondary | ICD-10-CM

## 2019-03-27 DIAGNOSIS — C9000 Multiple myeloma not having achieved remission: Secondary | ICD-10-CM

## 2019-03-27 DIAGNOSIS — Z5112 Encounter for antineoplastic immunotherapy: Secondary | ICD-10-CM | POA: Diagnosis not present

## 2019-03-27 LAB — CBC WITH DIFFERENTIAL/PLATELET
Abs Immature Granulocytes: 0.01 10*3/uL (ref 0.00–0.07)
Basophils Absolute: 0 10*3/uL (ref 0.0–0.1)
Basophils Relative: 1 %
Eosinophils Absolute: 0.1 10*3/uL (ref 0.0–0.5)
Eosinophils Relative: 4 %
HCT: 35 % — ABNORMAL LOW (ref 36.0–46.0)
Hemoglobin: 11.1 g/dL — ABNORMAL LOW (ref 12.0–15.0)
Immature Granulocytes: 0 %
Lymphocytes Relative: 29 %
Lymphs Abs: 0.9 10*3/uL (ref 0.7–4.0)
MCH: 34.3 pg — AB (ref 26.0–34.0)
MCHC: 31.7 g/dL (ref 30.0–36.0)
MCV: 108 fL — ABNORMAL HIGH (ref 80.0–100.0)
MONO ABS: 0.3 10*3/uL (ref 0.1–1.0)
Monocytes Relative: 9 %
Neutro Abs: 1.7 10*3/uL (ref 1.7–7.7)
Neutrophils Relative %: 57 %
Platelets: 183 10*3/uL (ref 150–400)
RBC: 3.24 MIL/uL — ABNORMAL LOW (ref 3.87–5.11)
RDW: 15 % (ref 11.5–15.5)
WBC: 3 10*3/uL — ABNORMAL LOW (ref 4.0–10.5)
nRBC: 0 % (ref 0.0–0.2)

## 2019-03-27 LAB — COMPREHENSIVE METABOLIC PANEL
ALT: 7 U/L (ref 0–44)
AST: 12 U/L — ABNORMAL LOW (ref 15–41)
Albumin: 4.1 g/dL (ref 3.5–5.0)
Alkaline Phosphatase: 66 U/L (ref 38–126)
Anion gap: 8 (ref 5–15)
BUN: 21 mg/dL (ref 8–23)
CALCIUM: 9.1 mg/dL (ref 8.9–10.3)
CO2: 24 mmol/L (ref 22–32)
Chloride: 108 mmol/L (ref 98–111)
Creatinine, Ser: 1.12 mg/dL — ABNORMAL HIGH (ref 0.44–1.00)
GFR calc non Af Amer: 47 mL/min — ABNORMAL LOW (ref >60)
GFR, EST AFRICAN AMERICAN: 54 mL/min — AB (ref >60)
Glucose, Bld: 104 mg/dL — ABNORMAL HIGH (ref 70–99)
Potassium: 3.6 mmol/L (ref 3.5–5.1)
Sodium: 140 mmol/L (ref 135–145)
Total Bilirubin: 0.5 mg/dL (ref 0.3–1.2)
Total Protein: 7.3 g/dL (ref 6.5–8.1)

## 2019-03-27 MED ORDER — PROCHLORPERAZINE MALEATE 10 MG PO TABS
10.0000 mg | ORAL_TABLET | Freq: Once | ORAL | Status: AC
  Administered 2019-03-27: 10 mg via ORAL

## 2019-03-27 MED ORDER — DENOSUMAB 120 MG/1.7ML ~~LOC~~ SOLN
120.0000 mg | Freq: Once | SUBCUTANEOUS | Status: AC
  Administered 2019-03-27: 120 mg via SUBCUTANEOUS

## 2019-03-27 MED ORDER — BORTEZOMIB CHEMO SQ INJECTION 3.5 MG (2.5MG/ML)
1.3000 mg/m2 | Freq: Once | INTRAMUSCULAR | Status: AC
  Administered 2019-03-27: 2.25 mg via SUBCUTANEOUS
  Filled 2019-03-27: qty 0.9

## 2019-03-27 NOTE — Patient Instructions (Signed)
[facility] Discharge Instructions for Patients Receiving Chemotherapy   Beginning January 23rd 2017 lab work for the [facility] will be done in the  Main lab at [facility] on 1st floor. If you have a lab appointment with the [facility] please come in thru the  Main Entrance and check in at the main information desk   Today you received the following chemotherapy agents Velcade and Xgeva  To help prevent nausea and vomiting after your treatment, we encourage you to take your nausea medication   If you develop nausea and vomiting, or diarrhea that is not controlled by your medication, call the clinic.  The clinic phone number is [phone]. Office hours are Monday-Friday 8:30am-5:00pm.  BELOW ARE SYMPTOMS THAT SHOULD BE REPORTED IMMEDIATELY:  *FEVER GREATER THAN 101.0 F  *CHILLS WITH OR WITHOUT FEVER  NAUSEA AND VOMITING THAT IS NOT CONTROLLED WITH YOUR NAUSEA MEDICATION  *UNUSUAL SHORTNESS OF BREATH  *UNUSUAL BRUISING OR BLEEDING  TENDERNESS IN MOUTH AND THROAT WITH OR WITHOUT PRESENCE OF ULCERS  *URINARY PROBLEMS  *BOWEL PROBLEMS  UNUSUAL RASH Items with * indicate a potential emergency and should be followed up as soon as possible. If you have an emergency after office hours please contact your primary care physician or go to the nearest emergency department.  Please call the clinic during office hours if you have any questions or concerns.   You may also contact the Patient Navigator at [phone] should you have any questions or need assistance in obtaining follow up care.      Resources For Cancer Patients and their Caregivers ? American Cancer Society: Can assist with transportation, wigs, general needs, runs Look Good Feel Better.        ([phone] ? Cancer Care: Provides financial assistance, online support groups, medication/co-pay assistance.  1-800-813-HOPE 928-735-5157) ? [place] Assists [place] cancer patients and their families through emotional , educational and financial support.  680-765-2830 ? [place] DSS Where to apply for food stamps, Medicaid and utility assistance. 8014551735 ? RCATS: Transportation to medical appointments. [phone] ? Social Security Administration: May apply for disability if have a Stage IV cancer. [phone] ? [employer], Disability and Transit Services: Assists with nutrition, care and transit needs. [phone]

## 2019-03-27 NOTE — Progress Notes (Signed)
Brittany Archer tolerated Xgeva and Velcade injections without incident or complaint. VSS. Discharged self ambulatory in satisfactory condition.

## 2019-03-28 ENCOUNTER — Other Ambulatory Visit (HOSPITAL_COMMUNITY): Payer: Self-pay | Admitting: *Deleted

## 2019-03-28 DIAGNOSIS — C9000 Multiple myeloma not having achieved remission: Secondary | ICD-10-CM

## 2019-03-28 MED ORDER — LENALIDOMIDE 20 MG PO CAPS: ORAL_CAPSULE | ORAL | 0 refills | Status: DC

## 2019-04-03 ENCOUNTER — Other Ambulatory Visit: Payer: Self-pay

## 2019-04-03 ENCOUNTER — Inpatient Hospital Stay (HOSPITAL_COMMUNITY): Payer: Medicare Other | Attending: Hematology

## 2019-04-03 ENCOUNTER — Inpatient Hospital Stay (HOSPITAL_BASED_OUTPATIENT_CLINIC_OR_DEPARTMENT_OTHER): Payer: Medicare Other | Admitting: Hematology

## 2019-04-03 ENCOUNTER — Inpatient Hospital Stay (HOSPITAL_COMMUNITY): Payer: Medicare Other

## 2019-04-03 ENCOUNTER — Encounter (HOSPITAL_COMMUNITY): Payer: Self-pay | Admitting: Hematology

## 2019-04-03 VITALS — BP 123/47 | HR 61 | Temp 98.4°F | Resp 14 | Wt 158.3 lb

## 2019-04-03 DIAGNOSIS — C9000 Multiple myeloma not having achieved remission: Secondary | ICD-10-CM

## 2019-04-03 DIAGNOSIS — Z5112 Encounter for antineoplastic immunotherapy: Secondary | ICD-10-CM | POA: Insufficient documentation

## 2019-04-03 DIAGNOSIS — E876 Hypokalemia: Secondary | ICD-10-CM

## 2019-04-03 LAB — COMPREHENSIVE METABOLIC PANEL
ALT: 8 U/L (ref 0–44)
AST: 8 U/L — ABNORMAL LOW (ref 15–41)
Albumin: 3.9 g/dL (ref 3.5–5.0)
Alkaline Phosphatase: 60 U/L (ref 38–126)
Anion gap: 9 (ref 5–15)
BUN: 23 mg/dL (ref 8–23)
CO2: 25 mmol/L (ref 22–32)
Calcium: 9.5 mg/dL (ref 8.9–10.3)
Chloride: 106 mmol/L (ref 98–111)
Creatinine, Ser: 1.1 mg/dL — ABNORMAL HIGH (ref 0.44–1.00)
GFR calc Af Amer: 56 mL/min — ABNORMAL LOW (ref >60)
GFR calc non Af Amer: 48 mL/min — ABNORMAL LOW (ref >60)
Glucose, Bld: 84 mg/dL (ref 70–99)
Potassium: 2.8 mmol/L — ABNORMAL LOW (ref 3.5–5.1)
Sodium: 140 mmol/L (ref 135–145)
Total Bilirubin: 0.6 mg/dL (ref 0.3–1.2)
Total Protein: 7.1 g/dL (ref 6.5–8.1)

## 2019-04-03 LAB — CBC WITH DIFFERENTIAL/PLATELET
Abs Immature Granulocytes: 0.03 10*3/uL (ref 0.00–0.07)
Basophils Absolute: 0 10*3/uL (ref 0.0–0.1)
Basophils Relative: 0 %
Eosinophils Absolute: 0.1 10*3/uL (ref 0.0–0.5)
Eosinophils Relative: 2 %
HCT: 34.3 % — ABNORMAL LOW (ref 36.0–46.0)
Hemoglobin: 11.1 g/dL — ABNORMAL LOW (ref 12.0–15.0)
Immature Granulocytes: 1 %
Lymphocytes Relative: 22 %
Lymphs Abs: 1 10*3/uL (ref 0.7–4.0)
MCH: 33.7 pg (ref 26.0–34.0)
MCHC: 32.4 g/dL (ref 30.0–36.0)
MCV: 104.3 fL — ABNORMAL HIGH (ref 80.0–100.0)
Monocytes Absolute: 0.5 10*3/uL (ref 0.1–1.0)
Monocytes Relative: 13 %
Neutro Abs: 2.7 10*3/uL (ref 1.7–7.7)
Neutrophils Relative %: 62 %
Platelets: 163 10*3/uL (ref 150–400)
RBC: 3.29 MIL/uL — ABNORMAL LOW (ref 3.87–5.11)
RDW: 14.5 % (ref 11.5–15.5)
WBC: 4.3 10*3/uL (ref 4.0–10.5)
nRBC: 0 % (ref 0.0–0.2)

## 2019-04-03 MED ORDER — PROCHLORPERAZINE MALEATE 10 MG PO TABS
10.0000 mg | ORAL_TABLET | Freq: Once | ORAL | Status: AC
  Administered 2019-04-03: 10 mg via ORAL
  Filled 2019-04-03: qty 1

## 2019-04-03 MED ORDER — ACYCLOVIR 400 MG PO TABS: 400.0000 mg | ORAL_TABLET | Freq: Two times a day (BID) | ORAL | 3 refills | Status: DC

## 2019-04-03 MED ORDER — BORTEZOMIB CHEMO SQ INJECTION 3.5 MG (2.5MG/ML)
1.3000 mg/m2 | Freq: Once | INTRAMUSCULAR | Status: AC
  Administered 2019-04-03: 2.25 mg via SUBCUTANEOUS
  Filled 2019-04-03: qty 0.9

## 2019-04-03 NOTE — Patient Instructions (Signed)
[facility] at [facility] Discharge Instructions  You were seen today by Dr. Katragadda. He went over your recent lab results. He will see you back in 4 weeks for labs and follow up.   Thank you for choosing [facility] at [facility] to provide your oncology and hematology care.  To afford each patient quality time with our provider, please arrive at least 15 minutes before your scheduled appointment time.   If you have a lab appointment with the Cancer Center please come in thru the  Main Entrance and check in at the main information desk  You need to re-schedule your appointment should you arrive 10 or more minutes late.  We strive to give you quality time with our providers, and arriving late affects you and other patients whose appointments are after yours.  Also, if you no show three or more times for appointments you may be dismissed from the clinic at the providers discretion.     Again, thank you for choosing [facility].  Our hope is that these requests will decrease the amount of time that you wait before being seen by our physicians.       _____________________________________________________________  Should you have questions after your visit to [facility], please contact our office at [phone] between the hours of 8:00 a.m. and 4:30 p.m.  Voicemails left after 4:00 p.m. will not be returned until the following business day.  For prescription refill requests, have your pharmacy contact our office and allow 72 hours.    Cancer Center Support Programs:   > Cancer Support Group  2nd Tuesday of the month 1pm-2pm, Journey Room    

## 2019-04-03 NOTE — Progress Notes (Signed)
[place], [place] [address]   CLINIC:  Medical Oncology/Hematology  PCP:  Vesta Mixer [address] [place] [address] [phone]   REASON FOR VISIT:  Follow-up for  multiple myeloma  CURRENT THERAPY:Valcade, Revlimd, and dexamethasone every 3 weeks    BRIEF ONCOLOGIC HISTORY:    Multiple myeloma not having achieved remission ([place])   01/20/2018 Initial Diagnosis    Multiple myeloma not having achieved remission ([place])    01/26/2018 -  Chemotherapy    The patient had bortezomib SQ (VELCADE) chemo injection 2.5 mg, 1.3 mg/m2 = 2.5 mg, Subcutaneous,  Once, 16 of 16 cycles Administration: 2.5 mg (01/26/2018), 2.5 mg (02/02/2018), 2.5 mg (02/09/2018), 2.5 mg (02/16/2018), 2.5 mg (02/23/2018), 2.5 mg (03/02/2018), 2.5 mg (03/09/2018), 2.5 mg (03/30/2018), 2.5 mg (04/06/2018), 2.5 mg (04/13/2018), 2.5 mg (04/21/2018), 2.5 mg (05/06/2018), 2.5 mg (05/11/2018), 2.5 mg (05/20/2018), 2.5 mg (05/27/2018), 2.5 mg (06/10/2018), 2.5 mg (06/17/2018), 2.5 mg (06/24/2018), 2.5 mg (07/08/2018), 2.5 mg (07/15/2018), 2.5 mg (07/22/2018), 2.5 mg (08/05/2018), 2.5 mg (08/12/2018), 2.5 mg (09/02/2018), 2.5 mg (09/09/2018), 2.5 mg (09/16/2018), 2.5 mg (09/30/2018), 2.5 mg (10/07/2018), 2.5 mg (10/14/2018), 2.5 mg (10/31/2018), 2.5 mg (11/07/2018), 2.5 mg (11/14/2018), 2.5 mg (11/28/2018), 2.25 mg (12/05/2018), 2.25 mg (12/12/2018), 2.25 mg (12/26/2018), 2.25 mg (01/02/2019), 2.25 mg (01/09/2019), 2.25 mg (01/23/2019), 2.25 mg (01/30/2019), 2.25 mg (02/06/2019), 2.25 mg (02/20/2019), 2.25 mg (02/27/2019), 2.25 mg (03/06/2019), 2.25 mg (03/20/2019), 2.25 mg (03/27/2019)  for chemotherapy treatment.       CANCER STAGING: Cancer Staging No matching staging information was found for the patient.   INTERVAL HISTORY:  Brittany Archer 78 y.o. female returns for routine follow-up and consideration for next cycle of chemotherapy.  She is continuing to tolerate Velcade very well.  She is also taking  acyclovir but requests refill.  Denies any shingles.  Denies any other infections or fevers in the last 4 weeks.  Denies any tingling or numbness in extremities.  Appetite is 100% and energy 75%.    REVIEW OF SYSTEMS:  Review of Systems  All other systems reviewed and are negative.    PAST MEDICAL/SURGICAL HISTORY:  Past Medical History:  Diagnosis Date  . Breast cancer ([place])    left breast/ 2008/ surg/ rad tx  . Coronary artery disease   . Diabetes mellitus    Past Surgical History:  Procedure Laterality Date  . ABDOMINAL HYSTERECTOMY    . BREAST SURGERY       SOCIAL HISTORY:  Social History   Socioeconomic History  . Marital status: Divorced    Spouse name: Not on file  . Number of children: Not on file  . Years of education: Not on file  . Highest education level: Not on file  Occupational History  . Not on file  Social Needs  . Financial resource strain: Not on file  . Food insecurity:    Worry: Not on file    Inability: Not on file  . Transportation needs:    Medical: Not on file    Non-medical: Not on file  Tobacco Use  . Smoking status: Never Smoker  . Smokeless tobacco: Never Used  Substance and Sexual Activity  . Alcohol use: No  . Drug use: No  . Sexual activity: Yes    Birth control/protection: Surgical  Lifestyle  . Physical activity:    Days per week: Not on file    Minutes per session: Not on file  . Stress:  Not on file  Relationships  . Social connections:    Talks on phone: Not on file    Gets together: Not on file    Attends religious service: Not on file    Active member of club or organization: Not on file    Attends meetings of clubs or organizations: Not on file    Relationship status: Not on file  . Intimate partner violence:    Fear of current or ex partner: Not on file    Emotionally abused: Not on file    Physically abused: Not on file    Forced sexual activity: Not on file  Other Topics Concern  . Not on file  Social  History Narrative  . Not on file    FAMILY HISTORY:  Family History  Problem Relation Age of Onset  . Obesity Sister     CURRENT MEDICATIONS:  Outpatient Encounter Medications as of 04/03/2019  Medication Sig  . acetaminophen (TYLENOL) 500 MG tablet Take 500 mg by mouth every 6 (six) hours as needed for mild pain or moderate pain.  Marland Kitchen acyclovir (ZOVIRAX) 400 MG tablet Take 1 tablet (400 mg total) by mouth 2 (two) times daily.  Marland Kitchen aspirin 81 MG tablet Take 81 mg by mouth daily.    . bortezomib IV (VELCADE) 3.5 MG injection Inject into the vein once. weekly  . cholecalciferol (VITAMIN D) 1000 units tablet Take 1,000 Units by mouth daily.  . Denosumab (XGEVA [place]) Inject into the skin. Every 28 days  . dexamethasone (DECADRON) 4 MG tablet Take 10 tablets (40 mg) on days 1, 8, and 15 of chemo. Repeat every 21 days.  Marland Kitchen glipiZIDE (GLUCOTROL) 5 MG tablet Take 5 mg by mouth daily before breakfast.   . lenalidomide (REVLIMID) 20 MG capsule Take one capsule daily on days 1-14 every 21 days.  Marland Kitchen lisinopril-hydrochlorothiazide (PRINZIDE,ZESTORETIC) 20-25 MG tablet Take 1 tablet by mouth daily.   . metFORMIN (GLUCOPHAGE) 1000 MG tablet Take 1,000 mg by mouth 2 times daily at 12 noon and 4 pm.    . ondansetron (ZOFRAN) 8 MG tablet Take 1 tablet (8 mg total) by mouth 2 (two) times daily as needed (Nausea or vomiting).  . potassium chloride SA (K-DUR,KLOR-CON) 20 MEQ tablet Take 1 tablet (20 mEq total) by mouth 3 (three) times daily.  . prochlorperazine (COMPAZINE) 10 MG tablet Take 1 tablet (10 mg total) by mouth every 6 (six) hours as needed (Nausea or vomiting).   No facility-administered encounter medications on file as of 04/03/2019.     ALLERGIES:  Allergies  Allergen Reactions  . Motrin [Ibuprofen] Rash     PHYSICAL EXAM:  ECOG Performance status: 1  Vitals:   04/03/19 1025  BP: (!) 123/47  Pulse: 61  Resp: 14  Temp: 98.4 F (36.9 C)  SpO2: 100%   Filed Weights   04/03/19 1025   Weight: 158 lb 4.8 oz (71.8 kg)    Physical Exam Constitutional:      Appearance: Normal appearance.  Cardiovascular:     Rate and Rhythm: Normal rate and regular rhythm.  Pulmonary:     Effort: Pulmonary effort is normal.     Breath sounds: Normal breath sounds.  Abdominal:     General: There is no distension.     Palpations: Abdomen is soft.     Tenderness: There is no abdominal tenderness.  Musculoskeletal:        General: No swelling.  Skin:    General: Skin is warm.  Neurological:     General: No focal deficit present.     Mental Status: She is alert and oriented to person, place, and time.  Psychiatric:        Mood and Affect: Mood normal.        Behavior: Behavior normal.      LABORATORY DATA:  I have reviewed the labs as listed.  CBC    Component Value Date/Time   WBC 4.3 04/03/2019 0946   RBC 3.29 (L) 04/03/2019 0946   HGB 11.1 (L) 04/03/2019 0946   HCT 34.3 (L) 04/03/2019 0946   PLT 163 04/03/2019 0946   MCV 104.3 (H) 04/03/2019 0946   MCH 33.7 04/03/2019 0946   MCHC 32.4 04/03/2019 0946   RDW 14.5 04/03/2019 0946   LYMPHSABS 1.0 04/03/2019 0946   MONOABS 0.5 04/03/2019 0946   EOSABS 0.1 04/03/2019 0946   BASOSABS 0.0 04/03/2019 0946   CMP Latest Ref Rng & Units 04/03/2019 03/27/2019 03/20/2019  Glucose 70 - 99 mg/dL 84 104(H) 90  BUN 8 - 23 mg/dL 23 21 24(H)  Creatinine 0.44 - 1.00 mg/dL 1.10(H) 1.12(H) 1.02(H)  Sodium 135 - 145 mmol/L 140 140 143  Potassium 3.5 - 5.1 mmol/L 2.8(L) 3.6 4.1  Chloride 98 - 111 mmol/L 106 108 108  CO2 22 - 32 mmol/L _0 Calcium 8.9 - 10.3 mg/dL 9.5 9.1 9.3  Total Protein 6.5 - 8.1 g/dL 7.1 7.3 7.2  Total Bilirubin 0.3 - 1.2 mg/dL 0.6 0.5 0.5  Alkaline Phos 38 - 126 U/L 60 66 51  AST 15 - 41 U/L 8(L) 12(L) 10(L)  ALT 0 - 44 U/L _1 DIAGNOSTIC IMAGING:  I have independently reviewed the scans and discussed with the patient.   I have reviewed Venita Lick LPN's note and agree with the  documentation.  I personally performed a face-to-face visit, made revisions and my assessment and plan is as follows.    ASSESSMENT & PLAN:   Multiple myeloma not having achieved remission ([place]) 1.  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: -Bone marrow biopsy on 01/03/2018 with 60% plasma cells, FISH panel with no abnormalities, chromosome analysis showing hyperdiploidy with gains of chromosomes 2, 3, 4, 7, 10, 15, beta-2 microglobulin 3.2, LDH normal, 0.9 g/dL of M spike at diagnosis, free light chain ratio of 114, Kappa Light chain of 1796 -Skeletal survey on 02/16/2018 showing subtle patchy areas of osteopenia of the thoracolumbar spine and possibly distal right clavicle which may reflect subtle changes of multiple myeloma - RVD (Velcade on days 1, 8 and 15, Revlimid 25 on days 1-21, dexamethasone 10 pills on day of Velcade every 28 days) cycle 1 started on 01/26/2018, cycle 4 on 04/13/2018, she is asynchronous with Revlimid and Velcade.  We have held her Revlimid once when [place] dropped to 600 during cycle 4. - Revlimid was dose reduced to 20 mg because of cytopenias. -She is continuing to tolerate Velcade very well without any major side effects.  Denies any tingling or numbness next 2 days. -She will continue dexamethasone 40 mg weekly.  She takes Revlimid 2 weeks on 1 week off.  - I reviewed myeloma panel from 02/27/2019.  M spike was 0.2 g.  Free light chain ratio was 1.7, up from 1.46.  Kappa light chains are down to 30.1, from 33. - We will send in a refill for acyclovir.  She will continue current treatment.  If  her SPEP and immunofixation are negative, will consider discontinuing Velcade at that point. -I will plan to see her back in 4 weeks with repeat myeloma panel.  2.  Bone strengthening: -She will continue monthly denosumab.  She will continue calcium and vitamin D twice daily.   3.  Hypokalemia: -She will continue potassium supplements.  She was told to take  extra pills today.      Orders placed this encounter:  Orders Placed This Encounter  Procedures  . Protein electrophoresis, serum  . Kappa/lambda light chains  . Lactate dehydrogenase  . CBC with Differential/Platelet  . Comprehensive metabolic panel  . Immunofixation electrophoresis      Derek Jack, MD [place] [phone]

## 2019-04-03 NOTE — Progress Notes (Signed)
1130 CBCD and CMET results reviewed with and pt seen by Dr. Delton Coombes and pt approved for Velcade injection today per MD. Pt took 2 of her Potassium pills that she brought with her today per MD order since her potassium was low. Pt instructed to continue and take her Potassium TID and verbalized understanding.           Brittany Archer tolerated Velcade injection well without complaints or incident. VSS Pt discharged self ambulatory in satisfactory condition

## 2019-04-03 NOTE — Assessment & Plan Note (Signed)
1.  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: -Bone marrow biopsy on 01/03/2018 with 60% plasma cells, FISH panel with no abnormalities, chromosome analysis showing hyperdiploidy with gains of chromosomes 2, 3, 4, 7, 10, 15, beta-2 microglobulin 3.2, LDH normal, 0.9 g/dL of M spike at diagnosis, free light chain ratio of 114, Kappa Light chain of 1796 -Skeletal survey on 02/16/2018 showing subtle patchy areas of osteopenia of the thoracolumbar spine and possibly distal right clavicle which may reflect subtle changes of multiple myeloma - RVD (Velcade on days 1, 8 and 15, Revlimid 25 on days 1-21, dexamethasone 10 pills on day of Velcade every 28 days) cycle 1 started on 01/26/2018, cycle 4 on 04/13/2018, she is asynchronous with Revlimid and Velcade.  We have held her Revlimid once when [place] dropped to 600 during cycle 4. - Revlimid was dose reduced to 20 mg because of cytopenias. -She is continuing to tolerate Velcade very well without any major side effects.  Denies any tingling or numbness next 2 days. -She will continue dexamethasone 40 mg weekly.  She takes Revlimid 2 weeks on 1 week off.  - I reviewed myeloma panel from 02/27/2019.  M spike was 0.2 g.  Free light chain ratio was 1.7, up from 1.46.  Kappa light chains are down to 30.1, from 33. - We will send in a refill for acyclovir.  She will continue current treatment.  If her SPEP and immunofixation are negative, will consider discontinuing Velcade at that point. -I will plan to see her back in 4 weeks with repeat myeloma panel.  2.  Bone strengthening: -She will continue monthly denosumab.  She will continue calcium and vitamin D twice daily.   3.  Hypokalemia: -She will continue potassium supplements.  She was told to take extra pills today.

## 2019-04-03 NOTE — Patient Instructions (Signed)
[facility] Discharge Instructions for Patients Receiving Chemotherapy   Beginning January 23rd 2017 lab work for the [facility] will be done in the  Main lab at [facility] on 1st floor. If you have a lab appointment with the [facility] please come in thru the  Main Entrance and check in at the main information desk   Today you received the following chemotherapy agents Velcae. Follow-up as scheduled. Call clinic for any questions or concerns  To help prevent nausea and vomiting after your treatment, we encourage you to take your nausea medication   If you develop nausea and vomiting, or diarrhea that is not controlled by your medication, call the clinic.  The clinic phone number is [phone]. Office hours are Monday-Friday 8:30am-5:00pm.  BELOW ARE SYMPTOMS THAT SHOULD BE REPORTED IMMEDIATELY:  *FEVER GREATER THAN 101.0 F  *CHILLS WITH OR WITHOUT FEVER  NAUSEA AND VOMITING THAT IS NOT CONTROLLED WITH YOUR NAUSEA MEDICATION  *UNUSUAL SHORTNESS OF BREATH  *UNUSUAL BRUISING OR BLEEDING  TENDERNESS IN MOUTH AND THROAT WITH OR WITHOUT PRESENCE OF ULCERS  *URINARY PROBLEMS  *BOWEL PROBLEMS  UNUSUAL RASH Items with * indicate a potential emergency and should be followed up as soon as possible. If you have an emergency after office hours please contact your primary care physician or go to the nearest emergency department.  Please call the clinic during office hours if you have any questions or concerns.   You may also contact the Patient Navigator at [phone] should you have any questions or need assistance in obtaining follow up care.      Resources For Cancer Patients and their Caregivers ? American Cancer Society: Can assist with transportation, wigs, general needs, runs Look Good Feel Better.        ([phone] ? Cancer Care: Provides financial assistance, online support groups, medication/co-pay assistance.  1-800-813-HOPE  804-516-4599) ? [place] Assists [place] cancer patients and their families through emotional , educational and financial support.  813-453-7120 ? [place] DSS Where to apply for food stamps, Medicaid and utility assistance. (860)057-4685 ? [employer]: Transportation to medical appointments. 2505540623 ? Social Security Administration: May apply for disability if have a Stage IV cancer. [phone] 412-590-1275 ? [employer], Disability and Transit Services: Assists with nutrition, care and transit needs. [phone]

## 2019-04-17 ENCOUNTER — Other Ambulatory Visit: Payer: Self-pay

## 2019-04-17 ENCOUNTER — Inpatient Hospital Stay (HOSPITAL_COMMUNITY): Payer: Medicare Other

## 2019-04-17 VITALS — BP 119/51 | HR 62 | Temp 98.6°F | Resp 18 | Wt 160.3 lb

## 2019-04-17 DIAGNOSIS — E876 Hypokalemia: Secondary | ICD-10-CM

## 2019-04-17 DIAGNOSIS — C9000 Multiple myeloma not having achieved remission: Secondary | ICD-10-CM

## 2019-04-17 DIAGNOSIS — D649 Anemia, unspecified: Secondary | ICD-10-CM

## 2019-04-17 DIAGNOSIS — Z5112 Encounter for antineoplastic immunotherapy: Secondary | ICD-10-CM | POA: Diagnosis not present

## 2019-04-17 DIAGNOSIS — Z5111 Encounter for antineoplastic chemotherapy: Secondary | ICD-10-CM

## 2019-04-17 DIAGNOSIS — E538 Deficiency of other specified B group vitamins: Secondary | ICD-10-CM

## 2019-04-17 LAB — COMPREHENSIVE METABOLIC PANEL
ALT: 11 U/L (ref 0–44)
AST: 10 U/L — ABNORMAL LOW (ref 15–41)
Albumin: 4.1 g/dL (ref 3.5–5.0)
Alkaline Phosphatase: 53 U/L (ref 38–126)
Anion gap: 9 (ref 5–15)
BUN: 30 mg/dL — ABNORMAL HIGH (ref 8–23)
CO2: 24 mmol/L (ref 22–32)
Calcium: 10 mg/dL (ref 8.9–10.3)
Chloride: 106 mmol/L (ref 98–111)
Creatinine, Ser: 1.25 mg/dL — ABNORMAL HIGH (ref 0.44–1.00)
GFR calc Af Amer: 47 mL/min — ABNORMAL LOW (ref >60)
GFR calc non Af Amer: 41 mL/min — ABNORMAL LOW (ref >60)
Glucose, Bld: 122 mg/dL — ABNORMAL HIGH (ref 70–99)
Potassium: 3.9 mmol/L (ref 3.5–5.1)
Sodium: 139 mmol/L (ref 135–145)
Total Bilirubin: 0.5 mg/dL (ref 0.3–1.2)
Total Protein: 7.3 g/dL (ref 6.5–8.1)

## 2019-04-17 LAB — CBC WITH DIFFERENTIAL/PLATELET
Abs Immature Granulocytes: 0.02 10*3/uL (ref 0.00–0.07)
Basophils Absolute: 0 10*3/uL (ref 0.0–0.1)
Basophils Relative: 1 %
Eosinophils Absolute: 0.2 10*3/uL (ref 0.0–0.5)
Eosinophils Relative: 5 %
HCT: 33.8 % — ABNORMAL LOW (ref 36.0–46.0)
Hemoglobin: 10.7 g/dL — ABNORMAL LOW (ref 12.0–15.0)
Immature Granulocytes: 1 %
Lymphocytes Relative: 26 %
Lymphs Abs: 0.9 10*3/uL (ref 0.7–4.0)
MCH: 33.3 pg (ref 26.0–34.0)
MCHC: 31.7 g/dL (ref 30.0–36.0)
MCV: 105.3 fL — ABNORMAL HIGH (ref 80.0–100.0)
Monocytes Absolute: 0.4 10*3/uL (ref 0.1–1.0)
Monocytes Relative: 12 %
Neutro Abs: 1.9 10*3/uL (ref 1.7–7.7)
Neutrophils Relative %: 55 %
Platelets: 199 10*3/uL (ref 150–400)
RBC: 3.21 MIL/uL — ABNORMAL LOW (ref 3.87–5.11)
RDW: 15 % (ref 11.5–15.5)
WBC: 3.3 10*3/uL — ABNORMAL LOW (ref 4.0–10.5)
nRBC: 0 % (ref 0.0–0.2)

## 2019-04-17 MED ORDER — PROCHLORPERAZINE MALEATE 10 MG PO TABS
10.0000 mg | ORAL_TABLET | Freq: Once | ORAL | Status: AC
  Administered 2019-04-17: 10 mg via ORAL
  Filled 2019-04-17: qty 1

## 2019-04-17 MED ORDER — BORTEZOMIB CHEMO SQ INJECTION 3.5 MG (2.5MG/ML)
1.3000 mg/m2 | Freq: Once | INTRAMUSCULAR | Status: AC
  Administered 2019-04-17: 2.25 mg via SUBCUTANEOUS
  Filled 2019-04-17: qty 0.9

## 2019-04-17 MED ORDER — LENALIDOMIDE 20 MG PO CAPS: ORAL_CAPSULE | ORAL | 0 refills | Status: DC

## 2019-04-17 MED ORDER — CYANOCOBALAMIN 1000 MCG/ML IJ SOLN
1000.0000 ug | Freq: Once | INTRAMUSCULAR | Status: AC
  Administered 2019-04-17: 1000 ug via INTRAMUSCULAR

## 2019-04-17 MED ORDER — CYANOCOBALAMIN 1000 MCG/ML IJ SOLN
INTRAMUSCULAR | Status: AC
  Filled 2019-04-17: qty 1

## 2019-04-17 NOTE — Patient Instructions (Signed)
[facility] Discharge Instructions for Patients Receiving Chemotherapy   Beginning January 23rd 2017 lab work for the Cancer Center will be done in the  Main lab at [facility] on 1st floor. If you have a lab appointment with the Cancer Center please come in thru the  Main Entrance and check in at the main information desk   Today you received the following chemotherapy agents Velcade  To help prevent nausea and vomiting after your treatment, we encourage you to take your nausea medication    If you develop nausea and vomiting, or diarrhea that is not controlled by your medication, call the clinic.  The clinic phone number is [phone]. Office hours are Monday-Friday 8:30am-5:00pm.  BELOW ARE SYMPTOMS THAT SHOULD BE REPORTED IMMEDIATELY:  *FEVER GREATER THAN 101.0 F  *CHILLS WITH OR WITHOUT FEVER  NAUSEA AND VOMITING THAT IS NOT CONTROLLED WITH YOUR NAUSEA MEDICATION  *UNUSUAL SHORTNESS OF BREATH  *UNUSUAL BRUISING OR BLEEDING  TENDERNESS IN MOUTH AND THROAT WITH OR WITHOUT PRESENCE OF ULCERS  *URINARY PROBLEMS  *BOWEL PROBLEMS  UNUSUAL RASH Items with * indicate a potential emergency and should be followed up as soon as possible. If you have an emergency after office hours please contact your primary care physician or go to the nearest emergency department.  Please call the clinic during office hours if you have any questions or concerns.   You may also contact the Patient Navigator at [phone] should you have any questions or need assistance in obtaining follow up care.      Resources For Cancer Patients and their Caregivers ? American Cancer Society: Can assist with transportation, wigs, general needs, runs Look Good Feel Better.        1-888-227-6333 ? Cancer Care: Provides financial assistance, online support groups, medication/co-pay assistance.  1-800-813-HOPE (4673) ? [facility] Assists [place] cancer  patients and their families through emotional , educational and financial support.  336-427-4357 ? [place] DSS Where to apply for food stamps, Medicaid and utility assistance. 336-342-1394 ? [employer]: Transportation to medical appointments. 336-347-2287 ? Social Security Administration: May apply for disability if have a Stage IV cancer. [phone] 1-800-772-1213 ? [place] Aging, Disability and Transit Services: Assists with nutrition, care and transit needs. [phone]          

## 2019-04-17 NOTE — Addendum Note (Signed)
Addended by: Joie Bimler on: 04/17/2019 11:53 AM   Modules accepted: Orders

## 2019-04-17 NOTE — Progress Notes (Signed)
Brittany Archer tolerated Velcade and B12 injection without incident or complaint. VSS. Discharged self ambulatory in satisfactory condition.

## 2019-04-24 ENCOUNTER — Inpatient Hospital Stay (HOSPITAL_COMMUNITY): Payer: Medicare Other

## 2019-04-24 ENCOUNTER — Other Ambulatory Visit: Payer: Self-pay

## 2019-04-24 ENCOUNTER — Encounter (HOSPITAL_COMMUNITY): Payer: Self-pay

## 2019-04-24 VITALS — BP 146/61 | HR 61 | Temp 97.7°F | Resp 18 | Wt 164.0 lb

## 2019-04-24 DIAGNOSIS — C9000 Multiple myeloma not having achieved remission: Secondary | ICD-10-CM

## 2019-04-24 DIAGNOSIS — Z5112 Encounter for antineoplastic immunotherapy: Secondary | ICD-10-CM | POA: Diagnosis not present

## 2019-04-24 DIAGNOSIS — E538 Deficiency of other specified B group vitamins: Secondary | ICD-10-CM

## 2019-04-24 LAB — CBC WITH DIFFERENTIAL/PLATELET
Abs Immature Granulocytes: 0.01 10*3/uL (ref 0.00–0.07)
Basophils Absolute: 0 10*3/uL (ref 0.0–0.1)
Basophils Relative: 1 %
Eosinophils Absolute: 0 10*3/uL (ref 0.0–0.5)
Eosinophils Relative: 1 %
HCT: 31.6 % — ABNORMAL LOW (ref 36.0–46.0)
Hemoglobin: 10.5 g/dL — ABNORMAL LOW (ref 12.0–15.0)
Immature Granulocytes: 0 %
Lymphocytes Relative: 30 %
Lymphs Abs: 1.2 10*3/uL (ref 0.7–4.0)
MCH: 34 pg (ref 26.0–34.0)
MCHC: 33.2 g/dL (ref 30.0–36.0)
MCV: 102.3 fL — ABNORMAL HIGH (ref 80.0–100.0)
Monocytes Absolute: 0.5 10*3/uL (ref 0.1–1.0)
Monocytes Relative: 12 %
Neutro Abs: 2.3 10*3/uL (ref 1.7–7.7)
Neutrophils Relative %: 56 %
Platelets: 155 10*3/uL (ref 150–400)
RBC: 3.09 MIL/uL — ABNORMAL LOW (ref 3.87–5.11)
RDW: 14.9 % (ref 11.5–15.5)
WBC: 4.1 10*3/uL (ref 4.0–10.5)
nRBC: 0 % (ref 0.0–0.2)

## 2019-04-24 LAB — COMPREHENSIVE METABOLIC PANEL
ALT: 10 U/L (ref 0–44)
AST: 11 U/L — ABNORMAL LOW (ref 15–41)
Albumin: 3.9 g/dL (ref 3.5–5.0)
Alkaline Phosphatase: 58 U/L (ref 38–126)
Anion gap: 7 (ref 5–15)
BUN: 20 mg/dL (ref 8–23)
CO2: 23 mmol/L (ref 22–32)
Calcium: 8.6 mg/dL — ABNORMAL LOW (ref 8.9–10.3)
Chloride: 110 mmol/L (ref 98–111)
Creatinine, Ser: 0.93 mg/dL (ref 0.44–1.00)
GFR calc Af Amer: >60 mL/min (ref >60)
GFR calc non Af Amer: 58 mL/min — ABNORMAL LOW (ref >60)
Glucose, Bld: 104 mg/dL — ABNORMAL HIGH (ref 70–99)
Potassium: 4.1 mmol/L (ref 3.5–5.1)
Sodium: 140 mmol/L (ref 135–145)
Total Bilirubin: 0.7 mg/dL (ref 0.3–1.2)
Total Protein: 7 g/dL (ref 6.5–8.1)

## 2019-04-24 LAB — LACTATE DEHYDROGENASE: LDH: 163 U/L (ref 98–192)

## 2019-04-24 MED ORDER — PROCHLORPERAZINE MALEATE 10 MG PO TABS
10.0000 mg | ORAL_TABLET | Freq: Once | ORAL | Status: AC
  Administered 2019-04-24: 10 mg via ORAL
  Filled 2019-04-24: qty 1

## 2019-04-24 MED ORDER — DEXAMETHASONE 4 MG PO TABS: ORAL_TABLET | ORAL | 3 refills | Status: DC

## 2019-04-24 MED ORDER — DENOSUMAB 120 MG/1.7ML ~~LOC~~ SOLN
120.0000 mg | Freq: Once | SUBCUTANEOUS | Status: AC
  Administered 2019-04-24: 120 mg via SUBCUTANEOUS
  Filled 2019-04-24: qty 1.7

## 2019-04-24 MED ORDER — BORTEZOMIB CHEMO SQ INJECTION 3.5 MG (2.5MG/ML)
1.3000 mg/m2 | Freq: Once | INTRAMUSCULAR | Status: AC
  Administered 2019-04-24: 2.25 mg via SUBCUTANEOUS
  Filled 2019-04-24: qty 0.9

## 2019-04-24 MED ORDER — DEXAMETHASONE 4 MG PO TABS
40.0000 mg | ORAL_TABLET | Freq: Once | ORAL | Status: AC
  Administered 2019-04-24: 40 mg via ORAL
  Filled 2019-04-24: qty 10

## 2019-04-24 NOTE — Progress Notes (Signed)
Pt presents today for Velcade injection. Pt states she is taking Revlimid as prescribed. VSS. Pt has no complaints of any changes since last visit. MAR reviewed. Labs within parameters for treatment.

## 2019-04-24 NOTE — Patient Instructions (Signed)
[facility] Discharge Instructions for Patients Receiving Chemotherapy  Today you received the following chemotherapy agents   To help prevent nausea and vomiting after your treatment, we encourage you to take your nausea medication   If you develop nausea and vomiting that is not controlled by your nausea medication, call the clinic.   BELOW ARE SYMPTOMS THAT SHOULD BE REPORTED IMMEDIATELY:  *FEVER GREATER THAN 100.5 F  *CHILLS WITH OR WITHOUT FEVER  NAUSEA AND VOMITING THAT IS NOT CONTROLLED WITH YOUR NAUSEA MEDICATION  *UNUSUAL SHORTNESS OF BREATH  *UNUSUAL BRUISING OR BLEEDING  TENDERNESS IN MOUTH AND THROAT WITH OR WITHOUT PRESENCE OF ULCERS  *URINARY PROBLEMS  *BOWEL PROBLEMS  UNUSUAL RASH Items with * indicate a potential emergency and should be followed up as soon as possible.  Feel free to call the clinic should you have any questions or concerns. The clinic phone number is [phone].  Please show the CHEMO ALERT CARD at check-in to the Emergency Department and triage nurse.   

## 2019-04-25 LAB — PROTEIN ELECTROPHORESIS, SERUM
A/G Ratio: 1.3 (ref 0.7–1.7)
Albumin ELP: 3.6 g/dL (ref 2.9–4.4)
Alpha-1-Globulin: 0.2 g/dL (ref 0.0–0.4)
Alpha-2-Globulin: 0.7 g/dL (ref 0.4–1.0)
Beta Globulin: 1 g/dL (ref 0.7–1.3)
Gamma Globulin: 0.9 g/dL (ref 0.4–1.8)
Globulin, Total: 2.8 g/dL (ref 2.2–3.9)
Total Protein ELP: 6.4 g/dL (ref 6.0–8.5)

## 2019-04-25 LAB — KAPPA/LAMBDA LIGHT CHAINS
Kappa free light chain: 25.9 mg/L — ABNORMAL HIGH (ref 3.3–19.4)
Kappa, lambda light chain ratio: 1.6 (ref 0.26–1.65)
Lambda free light chains: 16.2 mg/L (ref 5.7–26.3)

## 2019-04-26 LAB — IMMUNOFIXATION ELECTROPHORESIS
IgA: 354 mg/dL (ref 64–422)
IgG (Immunoglobin G), Serum: 1085 mg/dL (ref 586–1602)
IgM (Immunoglobulin M), Srm: 78 mg/dL (ref 26–217)
Total Protein ELP: 6.6 g/dL (ref 6.0–8.5)

## 2019-05-01 ENCOUNTER — Encounter (HOSPITAL_COMMUNITY): Payer: Self-pay | Admitting: Hematology

## 2019-05-01 ENCOUNTER — Inpatient Hospital Stay (HOSPITAL_COMMUNITY): Payer: Medicare Other | Attending: Hematology

## 2019-05-01 ENCOUNTER — Other Ambulatory Visit: Payer: Self-pay

## 2019-05-01 ENCOUNTER — Inpatient Hospital Stay (HOSPITAL_BASED_OUTPATIENT_CLINIC_OR_DEPARTMENT_OTHER): Payer: Medicare Other | Admitting: Hematology

## 2019-05-01 VITALS — BP 147/47 | HR 61 | Temp 98.1°F | Resp 18 | Wt 166.9 lb

## 2019-05-01 DIAGNOSIS — N189 Chronic kidney disease, unspecified: Secondary | ICD-10-CM

## 2019-05-01 DIAGNOSIS — C9000 Multiple myeloma not having achieved remission: Secondary | ICD-10-CM | POA: Diagnosis present

## 2019-05-01 DIAGNOSIS — D631 Anemia in chronic kidney disease: Secondary | ICD-10-CM | POA: Insufficient documentation

## 2019-05-01 DIAGNOSIS — Z5112 Encounter for antineoplastic immunotherapy: Secondary | ICD-10-CM | POA: Diagnosis not present

## 2019-05-01 DIAGNOSIS — E876 Hypokalemia: Secondary | ICD-10-CM | POA: Diagnosis not present

## 2019-05-01 DIAGNOSIS — E538 Deficiency of other specified B group vitamins: Secondary | ICD-10-CM

## 2019-05-01 LAB — COMPREHENSIVE METABOLIC PANEL
ALT: 9 U/L (ref 0–44)
AST: 11 U/L — ABNORMAL LOW (ref 15–41)
Albumin: 3.7 g/dL (ref 3.5–5.0)
Alkaline Phosphatase: 52 U/L (ref 38–126)
Anion gap: 10 (ref 5–15)
BUN: 20 mg/dL (ref 8–23)
CO2: 22 mmol/L (ref 22–32)
Calcium: 9.2 mg/dL (ref 8.9–10.3)
Chloride: 110 mmol/L (ref 98–111)
Creatinine, Ser: 1.13 mg/dL — ABNORMAL HIGH (ref 0.44–1.00)
GFR calc Af Amer: 54 mL/min — ABNORMAL LOW (ref >60)
GFR calc non Af Amer: 46 mL/min — ABNORMAL LOW (ref >60)
Glucose, Bld: 70 mg/dL (ref 70–99)
Potassium: 3.7 mmol/L (ref 3.5–5.1)
Sodium: 142 mmol/L (ref 135–145)
Total Bilirubin: 0.7 mg/dL (ref 0.3–1.2)
Total Protein: 6.6 g/dL (ref 6.5–8.1)

## 2019-05-01 LAB — CBC WITH DIFFERENTIAL/PLATELET
Abs Immature Granulocytes: 0.01 10*3/uL (ref 0.00–0.07)
Basophils Absolute: 0 10*3/uL (ref 0.0–0.1)
Basophils Relative: 0 %
Eosinophils Absolute: 0.1 10*3/uL (ref 0.0–0.5)
Eosinophils Relative: 2 %
HCT: 30 % — ABNORMAL LOW (ref 36.0–46.0)
Hemoglobin: 9.9 g/dL — ABNORMAL LOW (ref 12.0–15.0)
Immature Granulocytes: 0 %
Lymphocytes Relative: 26 %
Lymphs Abs: 1.2 10*3/uL (ref 0.7–4.0)
MCH: 33.6 pg (ref 26.0–34.0)
MCHC: 33 g/dL (ref 30.0–36.0)
MCV: 101.7 fL — ABNORMAL HIGH (ref 80.0–100.0)
Monocytes Absolute: 0.5 10*3/uL (ref 0.1–1.0)
Monocytes Relative: 11 %
Neutro Abs: 2.8 10*3/uL (ref 1.7–7.7)
Neutrophils Relative %: 61 %
Platelets: 117 10*3/uL — ABNORMAL LOW (ref 150–400)
RBC: 2.95 MIL/uL — ABNORMAL LOW (ref 3.87–5.11)
RDW: 15.1 % (ref 11.5–15.5)
WBC: 4.6 10*3/uL (ref 4.0–10.5)
nRBC: 0 % (ref 0.0–0.2)

## 2019-05-01 MED ORDER — EPOETIN ALFA 20000 UNIT/ML IJ SOLN
20000.0000 [IU] | Freq: Once | INTRAMUSCULAR | Status: AC
  Administered 2019-05-01: 20000 [IU] via SUBCUTANEOUS
  Filled 2019-05-01: qty 1

## 2019-05-01 MED ORDER — DIPHENHYDRAMINE HCL 50 MG/ML IJ SOLN: 25.0000 mg | Freq: Once | INTRAMUSCULAR | Status: DC | PRN

## 2019-05-01 MED ORDER — DIPHENHYDRAMINE HCL 50 MG/ML IJ SOLN: 50.0000 mg | Freq: Once | INTRAMUSCULAR | Status: DC | PRN

## 2019-05-01 MED ORDER — EPINEPHRINE HCL 0.1 MG/ML IJ SOLN: 0.2500 mg | Freq: Once | INTRAMUSCULAR | Status: DC | PRN

## 2019-05-01 MED ORDER — SODIUM CHLORIDE 0.9 % IV SOLN: Freq: Once | INTRAVENOUS | Status: DC | PRN

## 2019-05-01 MED ORDER — METHYLPREDNISOLONE SODIUM SUCC 125 MG IJ SOLR: 125.0000 mg | Freq: Once | INTRAMUSCULAR | Status: DC | PRN

## 2019-05-01 MED ORDER — ALBUTEROL SULFATE (2.5 MG/3ML) 0.083% IN NEBU: 2.5000 mg | INHALATION_SOLUTION | Freq: Once | RESPIRATORY_TRACT | Status: DC | PRN

## 2019-05-01 MED ORDER — PROCHLORPERAZINE MALEATE 10 MG PO TABS
10.0000 mg | ORAL_TABLET | Freq: Once | ORAL | Status: AC
  Administered 2019-05-01: 10 mg via ORAL
  Filled 2019-05-01: qty 1

## 2019-05-01 MED ORDER — CYANOCOBALAMIN 1000 MCG/ML IJ SOLN: 1000.0000 ug | Freq: Once | INTRAMUSCULAR | Status: DC

## 2019-05-01 MED ORDER — BORTEZOMIB CHEMO SQ INJECTION 3.5 MG (2.5MG/ML)
1.3000 mg/m2 | Freq: Once | INTRAMUSCULAR | Status: AC
  Administered 2019-05-01: 2.25 mg via SUBCUTANEOUS
  Filled 2019-05-01: qty 0.9

## 2019-05-01 NOTE — Assessment & Plan Note (Signed)
1.  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: -Bone marrow biopsy on 01/03/2018 with 60% plasma cells, FISH panel with no abnormalities, chromosome analysis showing hyperdiploidy with gains of chromosomes 2, 3, 4, 7, 10, 15, beta-2 microglobulin 3.2, LDH normal, 0.9 g/dL of M spike at diagnosis, free light chain ratio of 114, Kappa Light chain of 1796 -Skeletal survey on 02/16/2018 showing subtle patchy areas of osteopenia of the thoracolumbar spine and possibly distal right clavicle which may reflect subtle changes of multiple myeloma - RVD (Velcade on days 1, 8 and 15, Revlimid 25 on days 1-21, dexamethasone 10 pills on day of Velcade every 28 days) cycle 1 started on 01/26/2018, cycle 4 on 04/13/2018, she is asynchronous with Revlimid and Velcade.  We have held her Revlimid once when [place] dropped to 600 during cycle 4. - Revlimid was dose reduced to 20 mg because of cytopenias. -She is continuing dexamethasone 40 mg weekly.  She takes Revlimid 20 mg 2 weeks on 1 week off.  - Myeloma panel from 04/24/2019 shows SPEP negative.  Immunofixation was normal.  Kappa light chains are 26.  However ratio is 1.6. -At this time we will plan to continue Velcade along with Revlimid.  I plan to see her back in 4 weeks.  I plan to repeat myeloma panel again.  If they continue to be negative, will consider discontinuing Velcade.  2.  Bone strengthening: -She will continue monthly denosumab.  She will also continue calcium and vitamin D twice daily.  3.  Hypokalemia: -Potassium is normal today.  She will continue potassium supplements.

## 2019-05-01 NOTE — Patient Instructions (Signed)
Continue current Tx

## 2019-05-01 NOTE — Progress Notes (Addendum)
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  CURRENT THERAPY: RVD  BRIEF ONCOLOGIC HISTORY:    Multiple myeloma not having achieved remission ([place])   01/20/2018 Initial Diagnosis    Multiple myeloma not having achieved remission ([place])    01/26/2018 -  Chemotherapy    The patient had bortezomib SQ (VELCADE) chemo injection 2.5 mg, 1.3 mg/m2 = 2.5 mg, Subcutaneous,  Once, 17 of 19 cycles Administration: 2.5 mg (01/26/2018), 2.5 mg (02/02/2018), 2.5 mg (02/09/2018), 2.5 mg (02/16/2018), 2.5 mg (02/23/2018), 2.5 mg (03/02/2018), 2.5 mg (03/09/2018), 2.5 mg (03/30/2018), 2.5 mg (04/06/2018), 2.5 mg (04/13/2018), 2.5 mg (04/21/2018), 2.5 mg (05/06/2018), 2.5 mg (05/11/2018), 2.5 mg (05/20/2018), 2.5 mg (05/27/2018), 2.5 mg (06/10/2018), 2.5 mg (06/17/2018), 2.5 mg (06/24/2018), 2.5 mg (07/08/2018), 2.5 mg (07/15/2018), 2.5 mg (07/22/2018), 2.5 mg (08/05/2018), 2.5 mg (08/12/2018), 2.5 mg (09/02/2018), 2.5 mg (09/09/2018), 2.5 mg (09/16/2018), 2.5 mg (09/30/2018), 2.5 mg (10/07/2018), 2.5 mg (10/14/2018), 2.5 mg (10/31/2018), 2.5 mg (11/07/2018), 2.5 mg (11/14/2018), 2.5 mg (11/28/2018), 2.25 mg (12/05/2018), 2.25 mg (12/12/2018), 2.25 mg (12/26/2018), 2.25 mg (01/02/2019), 2.25 mg (01/09/2019), 2.25 mg (01/23/2019), 2.25 mg (01/30/2019), 2.25 mg (02/06/2019), 2.25 mg (02/20/2019), 2.25 mg (02/27/2019), 2.25 mg (03/06/2019), 2.25 mg (03/20/2019), 2.25 mg (03/27/2019), 2.25 mg (04/03/2019), 2.25 mg (04/17/2019), 2.25 mg (04/24/2019), 2.25 mg (05/01/2019)  for chemotherapy treatment.       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 Vanengen 78 y.o. female returns for routine follow-up and consideration for next cycle of chemotherapy.  She  reports overall doing well. Denies any nausea, vomiting, or diarrhea. No abdominal pain. No new neuropathies. No CP, SOB, lightheadedness or dizziness. Overall, she tells me she has been feeling pretty well. Energy levels 75%; appetite 75%. States feels ready for next cycle of chemo today.     REVIEW OF SYSTEMS:  Review of Systems  Constitutional: Positive for fatigue.  HENT:  Negative.   Eyes: Negative.   Respiratory: Negative.   Cardiovascular: Positive for leg swelling.  Gastrointestinal: Negative.   Endocrine: Negative.   Genitourinary: Negative.    Musculoskeletal: Negative.   Skin: Negative.   Neurological: Negative.   Hematological: Negative.   Psychiatric/Behavioral: Negative.      PAST MEDICAL/SURGICAL HISTORY:  Past Medical History:  Diagnosis Date  . Breast cancer ([place])    left breast/ 2008/ surg/ rad tx  . Coronary artery disease   . Diabetes mellitus    Past Surgical History:  Procedure Laterality Date  . ABDOMINAL HYSTERECTOMY    . BREAST SURGERY       SOCIAL HISTORY:  Social History   Socioeconomic History  . Marital status: Divorced    Spouse name: Not on file  . Number of children: Not on file  . Years of education: Not on file  . Highest education level: Not on file  Occupational History  . Not on file  Social Needs  . Financial resource strain: Not on file  . Food insecurity:    Worry: Not on file    Inability: Not on file  . Transportation needs:    Medical: Not on file    Non-medical: Not on file  Tobacco Use  .  Smoking status: Never Smoker  . Smokeless tobacco: Never Used  Substance and Sexual Activity  . Alcohol use: No  . Drug use: No  . Sexual activity: Yes    Birth control/protection: Surgical  Lifestyle  . Physical activity:    Days per week: Not on file    Minutes per session: Not on file  . Stress: Not on file  Relationships  . Social connections:    Talks on phone: Not on file    Gets together: Not on file     Attends religious service: Not on file    Active member of club or organization: Not on file    Attends meetings of clubs or organizations: Not on file    Relationship status: Not on file  . Intimate partner violence:    Fear of current or ex partner: Not on file    Emotionally abused: Not on file    Physically abused: Not on file    Forced sexual activity: Not on file  Other Topics Concern  . Not on file  Social History Narrative  . Not on file    FAMILY HISTORY:  Family History  Problem Relation Age of Onset  . Obesity Sister     CURRENT MEDICATIONS:  Outpatient Encounter Medications as of 05/01/2019  Medication Sig  . acetaminophen (TYLENOL) 500 MG tablet Take 500 mg by mouth every 6 (six) hours as needed for mild pain or moderate pain.  Marland Kitchen acyclovir (ZOVIRAX) 400 MG tablet Take 1 tablet (400 mg total) by mouth 2 (two) times daily.  Marland Kitchen aspirin 81 MG tablet Take 81 mg by mouth daily.    . bortezomib IV (VELCADE) 3.5 MG injection Inject into the vein once. weekly  . cholecalciferol (VITAMIN D) 1000 units tablet Take 1,000 Units by mouth daily.  . Denosumab (XGEVA [place]) Inject into the skin. Every 28 days  . dexamethasone (DECADRON) 4 MG tablet Take 10 tablets (40 mg) on days 1, 8, and 15 of chemo. Repeat every 21 days.  Marland Kitchen glipiZIDE (GLUCOTROL) 5 MG tablet Take 5 mg by mouth daily before breakfast.   . lenalidomide (REVLIMID) 20 MG capsule Take one capsule daily on days 1-14 every 21 days.  Marland Kitchen lisinopril-hydrochlorothiazide (PRINZIDE,ZESTORETIC) 20-25 MG tablet Take 1 tablet by mouth daily.   . metFORMIN (GLUCOPHAGE) 1000 MG tablet Take 1,000 mg by mouth 2 times daily at 12 noon and 4 pm.    . ondansetron (ZOFRAN) 8 MG tablet Take 1 tablet (8 mg total) by mouth 2 (two) times daily as needed (Nausea or vomiting).  . potassium chloride SA (K-DUR,KLOR-CON) 20 MEQ tablet Take 1 tablet (20 mEq total) by mouth 3 (three) times daily.  . prochlorperazine (COMPAZINE) 10 MG tablet Take 1 tablet  (10 mg total) by mouth every 6 (six) hours as needed (Nausea or vomiting).   No facility-administered encounter medications on file as of 05/01/2019.     ALLERGIES:  Allergies  Allergen Reactions  . Motrin [Ibuprofen] Rash     PHYSICAL EXAM:  ECOG Performance status: 1  Vitals:   05/01/19 0852  BP: (!) 147/47  Pulse: 61  Resp: 18  Temp: 98.1 F (36.7 C)  SpO2: 100%   Filed Weights   05/01/19 0852  Weight: 166 lb 14.4 oz (75.7 kg)    Physical Exam Vitals signs reviewed.  Constitutional:      Appearance: Normal appearance.  Cardiovascular:     Rate and Rhythm: Normal rate and regular rhythm.  Heart sounds: Normal heart sounds.  Pulmonary:     Effort: Pulmonary effort is normal.     Breath sounds: Normal breath sounds.  Abdominal:     General: There is no distension.     Palpations: Abdomen is soft. There is no mass.  Musculoskeletal:        General: No swelling.  Skin:    General: Skin is warm.  Neurological:     General: No focal deficit present.     Mental Status: She is alert and oriented to person, place, and time.  Psychiatric:        Mood and Affect: Mood normal.        Behavior: Behavior normal.      LABORATORY DATA:  I have reviewed the labs as listed.  CBC    Component Value Date/Time   WBC 4.6 05/01/2019 0833   RBC 2.95 (L) 05/01/2019 0833   HGB 9.9 (L) 05/01/2019 0833   HCT 30.0 (L) 05/01/2019 0833   PLT 117 (L) 05/01/2019 0833   MCV 101.7 (H) 05/01/2019 0833   MCH 33.6 05/01/2019 0833   MCHC 33.0 05/01/2019 0833   RDW 15.1 05/01/2019 0833   LYMPHSABS 1.2 05/01/2019 0833   MONOABS 0.5 05/01/2019 0833   EOSABS 0.1 05/01/2019 0833   BASOSABS 0.0 05/01/2019 0833   CMP Latest Ref Rng & Units 05/01/2019 04/24/2019 04/17/2019  Glucose 70 - 99 mg/dL 70 104(H) 122(H)  BUN 8 - 23 mg/dL 20 20 30(H)  Creatinine 0.44 - 1.00 mg/dL 1.13(H) 0.93 1.25(H)  Sodium 135 - 145 mmol/L 142 140 139  Potassium 3.5 - 5.1 mmol/L 3.7 4.1 3.9  Chloride 98 -  111 mmol/L 110 110 106  CO2 22 - 32 mmol/L _0 Calcium 8.9 - 10.3 mg/dL 9.2 8.6(L) 10.0  Total Protein 6.5 - 8.1 g/dL 6.6 7.0 7.3  Total Bilirubin 0.3 - 1.2 mg/dL 0.7 0.7 0.5  Alkaline Phos 38 - 126 U/L 52 58 53  AST 15 - 41 U/L 11(L) 11(L) 10(L)  ALT 0 - 44 U/L _1 DIAGNOSTIC IMAGING:  I have independently reviewed the scans and discussed with the patient.   I have reviewed Elodia Florence, NP's note and agree with the documentation.  I personally performed a face-to-face visit, made revisions and my assessment and plan is as follows.    ASSESSMENT & PLAN:   Multiple myeloma not having achieved remission ([place]) 1.  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: -Bone marrow biopsy on 01/03/2018 with 60% plasma cells, FISH panel with no abnormalities, chromosome analysis showing hyperdiploidy with gains of chromosomes 2, 3, 4, 7, 10, 15, beta-2 microglobulin 3.2, LDH normal, 0.9 g/dL of M spike at diagnosis, free light chain ratio of 114, Kappa Light chain of 1796 -Skeletal survey on 02/16/2018 showing subtle patchy areas of osteopenia of the thoracolumbar spine and possibly distal right clavicle which may reflect subtle changes of multiple myeloma - RVD (Velcade on days 1, 8 and 15, Revlimid 25 on days 1-21, dexamethasone 10 pills on day of Velcade every 28 days) cycle 1 started on 01/26/2018, cycle 4 on 04/13/2018, she is asynchronous with Revlimid and Velcade.  We have held her Revlimid once when [place] dropped to 600 during cycle 4. - Revlimid was dose reduced to 20 mg because of cytopenias. -She is continuing dexamethasone 40 mg weekly.  She takes Revlimid 20 mg 2 weeks on 1 week off.  -  Myeloma panel from 04/24/2019 shows SPEP negative.  Immunofixation was normal.  Kappa light chains are 26.  However ratio is 1.6. -At this time we will plan to continue Velcade along with Revlimid.  I plan to see her back in 4 weeks.  I plan to repeat myeloma panel again.  If  they continue to be negative, will consider discontinuing Velcade.  2.  Bone strengthening: -She will continue monthly denosumab.  She will also continue calcium and vitamin D twice daily.  3.  Hypokalemia: -Potassium is normal today.  She will continue potassium supplements.  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  Total time spent is 25 minutes with more than 50% of the time spent face-to-face discussing treatment management, management of side effects and coordination of care.    Orders placed this encounter:  Orders Placed This Encounter  Procedures  . CBC with Differential  . Comprehensive metabolic panel  . Immunofixation electrophoresis  . Protein electrophoresis, serum  . Kappa/lambda light chains      Derek Jack, [place] [phone]

## 2019-05-01 NOTE — Progress Notes (Signed)
Madison Direnzo Hanrahan presents today for injection per MD orders. Procrit 20,000 administered SQ in left Upper Arm. Administration without incident. Patient tolerated well.  Velcade given today per MD orders. Tolerated infusion without adverse affects. Vital signs stable. No complaints at this time. Discharged from clinic ambulatory. F/U with [facility] as scheduled.

## 2019-05-11 ENCOUNTER — Other Ambulatory Visit (HOSPITAL_COMMUNITY): Payer: Self-pay | Admitting: Nurse Practitioner

## 2019-05-11 DIAGNOSIS — E876 Hypokalemia: Secondary | ICD-10-CM

## 2019-05-12 ENCOUNTER — Other Ambulatory Visit (HOSPITAL_COMMUNITY): Payer: Self-pay | Admitting: Nurse Practitioner

## 2019-05-12 ENCOUNTER — Other Ambulatory Visit (HOSPITAL_COMMUNITY): Payer: Self-pay | Admitting: Hematology

## 2019-05-15 ENCOUNTER — Inpatient Hospital Stay (HOSPITAL_COMMUNITY): Payer: Medicare Other

## 2019-05-15 ENCOUNTER — Other Ambulatory Visit: Payer: Self-pay

## 2019-05-15 ENCOUNTER — Encounter (HOSPITAL_COMMUNITY): Payer: Self-pay

## 2019-05-15 VITALS — BP 144/68 | HR 71 | Temp 97.7°F | Resp 18 | Wt 164.0 lb

## 2019-05-15 DIAGNOSIS — C9000 Multiple myeloma not having achieved remission: Secondary | ICD-10-CM

## 2019-05-15 DIAGNOSIS — E538 Deficiency of other specified B group vitamins: Secondary | ICD-10-CM

## 2019-05-15 DIAGNOSIS — Z5112 Encounter for antineoplastic immunotherapy: Secondary | ICD-10-CM | POA: Diagnosis not present

## 2019-05-15 LAB — CBC WITH DIFFERENTIAL/PLATELET
Abs Immature Granulocytes: 0.01 10*3/uL (ref 0.00–0.07)
Basophils Absolute: 0 10*3/uL (ref 0.0–0.1)
Basophils Relative: 0 %
Eosinophils Absolute: 0.1 10*3/uL (ref 0.0–0.5)
Eosinophils Relative: 1 %
HCT: 31.5 % — ABNORMAL LOW (ref 36.0–46.0)
Hemoglobin: 10.1 g/dL — ABNORMAL LOW (ref 12.0–15.0)
Immature Granulocytes: 0 %
Lymphocytes Relative: 19 %
Lymphs Abs: 1.3 10*3/uL (ref 0.7–4.0)
MCH: 33.3 pg (ref 26.0–34.0)
MCHC: 32.1 g/dL (ref 30.0–36.0)
MCV: 104 fL — ABNORMAL HIGH (ref 80.0–100.0)
Monocytes Absolute: 0.7 10*3/uL (ref 0.1–1.0)
Monocytes Relative: 11 %
Neutro Abs: 4.7 10*3/uL (ref 1.7–7.7)
Neutrophils Relative %: 69 %
Platelets: 189 10*3/uL (ref 150–400)
RBC: 3.03 MIL/uL — ABNORMAL LOW (ref 3.87–5.11)
RDW: 16 % — ABNORMAL HIGH (ref 11.5–15.5)
WBC: 6.8 10*3/uL (ref 4.0–10.5)
nRBC: 0 % (ref 0.0–0.2)

## 2019-05-15 LAB — COMPREHENSIVE METABOLIC PANEL
ALT: 9 U/L (ref 0–44)
AST: 9 U/L — ABNORMAL LOW (ref 15–41)
Albumin: 3.9 g/dL (ref 3.5–5.0)
Alkaline Phosphatase: 61 U/L (ref 38–126)
Anion gap: 10 (ref 5–15)
BUN: 19 mg/dL (ref 8–23)
CO2: 24 mmol/L (ref 22–32)
Calcium: 9 mg/dL (ref 8.9–10.3)
Chloride: 106 mmol/L (ref 98–111)
Creatinine, Ser: 1.07 mg/dL — ABNORMAL HIGH (ref 0.44–1.00)
GFR calc Af Amer: 57 mL/min — ABNORMAL LOW (ref >60)
GFR calc non Af Amer: 49 mL/min — ABNORMAL LOW (ref >60)
Glucose, Bld: 92 mg/dL (ref 70–99)
Potassium: 3.8 mmol/L (ref 3.5–5.1)
Sodium: 140 mmol/L (ref 135–145)
Total Bilirubin: 0.8 mg/dL (ref 0.3–1.2)
Total Protein: 7.1 g/dL (ref 6.5–8.1)

## 2019-05-15 MED ORDER — PROCHLORPERAZINE MALEATE 10 MG PO TABS
ORAL_TABLET | ORAL | Status: AC
  Filled 2019-05-15: qty 1

## 2019-05-15 MED ORDER — BORTEZOMIB CHEMO SQ INJECTION 3.5 MG (2.5MG/ML)
1.3000 mg/m2 | Freq: Once | INTRAMUSCULAR | Status: AC
  Administered 2019-05-15: 14:00:00 2.25 mg via SUBCUTANEOUS
  Filled 2019-05-15: qty 0.9

## 2019-05-15 MED ORDER — PROCHLORPERAZINE MALEATE 10 MG PO TABS
10.0000 mg | ORAL_TABLET | Freq: Once | ORAL | Status: AC
  Administered 2019-05-15: 10 mg via ORAL

## 2019-05-15 MED ORDER — CYANOCOBALAMIN 1000 MCG/ML IJ SOLN
1000.0000 ug | Freq: Once | INTRAMUSCULAR | Status: AC
  Administered 2019-05-15: 1000 ug via INTRAMUSCULAR

## 2019-05-15 MED ORDER — CYANOCOBALAMIN 1000 MCG/ML IJ SOLN
INTRAMUSCULAR | Status: AC
  Filled 2019-05-15: qty 1

## 2019-05-15 NOTE — Patient Instructions (Signed)
[facility] Discharge Instructions for Patients Receiving Chemotherapy   Beginning January 23rd 2017 lab work for the Cancer Center will be done in the  Main lab at [facility] on 1st floor. If you have a lab appointment with the Cancer Center please come in thru the  Main Entrance and check in at the main information desk   Today you received the following chemotherapy agents Velcade  To help prevent nausea and vomiting after your treatment, we encourage you to take your nausea medication    If you develop nausea and vomiting, or diarrhea that is not controlled by your medication, call the clinic.  The clinic phone number is [phone]. Office hours are Monday-Friday 8:30am-5:00pm.  BELOW ARE SYMPTOMS THAT SHOULD BE REPORTED IMMEDIATELY:  *FEVER GREATER THAN 101.0 F  *CHILLS WITH OR WITHOUT FEVER  NAUSEA AND VOMITING THAT IS NOT CONTROLLED WITH YOUR NAUSEA MEDICATION  *UNUSUAL SHORTNESS OF BREATH  *UNUSUAL BRUISING OR BLEEDING  TENDERNESS IN MOUTH AND THROAT WITH OR WITHOUT PRESENCE OF ULCERS  *URINARY PROBLEMS  *BOWEL PROBLEMS  UNUSUAL RASH Items with * indicate a potential emergency and should be followed up as soon as possible. If you have an emergency after office hours please contact your primary care physician or go to the nearest emergency department.  Please call the clinic during office hours if you have any questions or concerns.   You may also contact the Patient Navigator at [phone] should you have any questions or need assistance in obtaining follow up care.      Resources For Cancer Patients and their Caregivers ? American Cancer Society: Can assist with transportation, wigs, general needs, runs Look Good Feel Better.        1-888-227-6333 ? Cancer Care: Provides financial assistance, online support groups, medication/co-pay assistance.  1-800-813-HOPE (4673) ? [facility] Assists [place] cancer  patients and their families through emotional , educational and financial support.  336-427-4357 ? [place] DSS Where to apply for food stamps, Medicaid and utility assistance. 336-342-1394 ? [facility]: Transportation to medical appointments. 336-347-2287 ? Social Security Administration: May apply for disability if have a Stage IV cancer. 336-342-7796 1-800-772-1213 ? [place] [facility]ging, Disability and Transit Services: Assists with nutrition, care and transit needs. 336-349-2343          

## 2019-05-15 NOTE — Progress Notes (Signed)
Brittany Archer tolerated Velcade injection without incident or complaint. VSS. Discharged self ambulatory in satisfactory condition.

## 2019-05-23 ENCOUNTER — Encounter (HOSPITAL_COMMUNITY): Payer: Self-pay

## 2019-05-23 ENCOUNTER — Inpatient Hospital Stay (HOSPITAL_COMMUNITY): Payer: Medicare Other

## 2019-05-23 ENCOUNTER — Other Ambulatory Visit: Payer: Self-pay

## 2019-05-23 VITALS — BP 138/68 | HR 55 | Temp 98.7°F | Resp 18 | Wt 161.0 lb

## 2019-05-23 DIAGNOSIS — Z5112 Encounter for antineoplastic immunotherapy: Secondary | ICD-10-CM | POA: Diagnosis not present

## 2019-05-23 DIAGNOSIS — E538 Deficiency of other specified B group vitamins: Secondary | ICD-10-CM

## 2019-05-23 DIAGNOSIS — C9000 Multiple myeloma not having achieved remission: Secondary | ICD-10-CM

## 2019-05-23 LAB — CBC WITH DIFFERENTIAL/PLATELET
Abs Immature Granulocytes: 0 10*3/uL (ref 0.00–0.07)
Basophils Absolute: 0 10*3/uL (ref 0.0–0.1)
Basophils Relative: 0 %
Eosinophils Absolute: 0.1 10*3/uL (ref 0.0–0.5)
Eosinophils Relative: 1 %
HCT: 30.7 % — ABNORMAL LOW (ref 36.0–46.0)
Hemoglobin: 9.7 g/dL — ABNORMAL LOW (ref 12.0–15.0)
Immature Granulocytes: 0 %
Lymphocytes Relative: 28 %
Lymphs Abs: 1.2 10*3/uL (ref 0.7–4.0)
MCH: 33.2 pg (ref 26.0–34.0)
MCHC: 31.6 g/dL (ref 30.0–36.0)
MCV: 105.1 fL — ABNORMAL HIGH (ref 80.0–100.0)
Monocytes Absolute: 0.3 10*3/uL (ref 0.1–1.0)
Monocytes Relative: 8 %
Neutro Abs: 2.6 10*3/uL (ref 1.7–7.7)
Neutrophils Relative %: 63 %
Platelets: 211 10*3/uL (ref 150–400)
RBC: 2.92 MIL/uL — ABNORMAL LOW (ref 3.87–5.11)
RDW: 15.5 % (ref 11.5–15.5)
WBC: 4.2 10*3/uL (ref 4.0–10.5)
nRBC: 0 % (ref 0.0–0.2)

## 2019-05-23 LAB — COMPREHENSIVE METABOLIC PANEL
ALT: 8 U/L (ref 0–44)
AST: 10 U/L — ABNORMAL LOW (ref 15–41)
Albumin: 3.9 g/dL (ref 3.5–5.0)
Alkaline Phosphatase: 57 U/L (ref 38–126)
Anion gap: 9 (ref 5–15)
BUN: 23 mg/dL (ref 8–23)
CO2: 26 mmol/L (ref 22–32)
Calcium: 10.6 mg/dL — ABNORMAL HIGH (ref 8.9–10.3)
Chloride: 104 mmol/L (ref 98–111)
Creatinine, Ser: 1.17 mg/dL — ABNORMAL HIGH (ref 0.44–1.00)
GFR calc Af Amer: 51 mL/min — ABNORMAL LOW (ref >60)
GFR calc non Af Amer: 44 mL/min — ABNORMAL LOW (ref >60)
Glucose, Bld: 124 mg/dL — ABNORMAL HIGH (ref 70–99)
Potassium: 3.9 mmol/L (ref 3.5–5.1)
Sodium: 139 mmol/L (ref 135–145)
Total Bilirubin: 0.7 mg/dL (ref 0.3–1.2)
Total Protein: 7.3 g/dL (ref 6.5–8.1)

## 2019-05-23 MED ORDER — DEXAMETHASONE 4 MG PO TABS
40.0000 mg | ORAL_TABLET | Freq: Once | ORAL | Status: AC
  Administered 2019-05-23: 15:00:00 40 mg via ORAL

## 2019-05-23 MED ORDER — EPOETIN ALFA 20000 UNIT/ML IJ SOLN
20000.0000 [IU] | Freq: Once | INTRAMUSCULAR | Status: AC
  Administered 2019-05-23: 15:00:00 20000 [IU] via SUBCUTANEOUS
  Filled 2019-05-23: qty 1

## 2019-05-23 MED ORDER — DENOSUMAB 120 MG/1.7ML ~~LOC~~ SOLN
120.0000 mg | Freq: Once | SUBCUTANEOUS | Status: AC
  Administered 2019-05-23: 120 mg via SUBCUTANEOUS
  Filled 2019-05-23: qty 1.7

## 2019-05-23 MED ORDER — BORTEZOMIB CHEMO SQ INJECTION 3.5 MG (2.5MG/ML)
1.3000 mg/m2 | Freq: Once | INTRAMUSCULAR | Status: AC
  Administered 2019-05-23: 2.25 mg via SUBCUTANEOUS
  Filled 2019-05-23: qty 0.9

## 2019-05-23 MED ORDER — PROCHLORPERAZINE MALEATE 10 MG PO TABS
10.0000 mg | ORAL_TABLET | Freq: Once | ORAL | Status: AC
  Administered 2019-05-23: 10 mg via ORAL
  Filled 2019-05-23: qty 1

## 2019-05-23 NOTE — Progress Notes (Signed)
Patient tolerated injections with no complaints voiced.  Sites clean and dry with no bruising or swelling noted at site.  Band aids applied.  Vss with discharge and left ambulatory with no s/s of distress noted.  

## 2019-05-24 LAB — KAPPA/LAMBDA LIGHT CHAINS
Kappa free light chain: 40.5 mg/L — ABNORMAL HIGH (ref 3.3–19.4)
Kappa, lambda light chain ratio: 1.91 — ABNORMAL HIGH (ref 0.26–1.65)
Lambda free light chains: 21.2 mg/L (ref 5.7–26.3)

## 2019-05-24 LAB — PROTEIN ELECTROPHORESIS, SERUM
A/G Ratio: 1.1 (ref 0.7–1.7)
Albumin ELP: 3.5 g/dL (ref 2.9–4.4)
Alpha-1-Globulin: 0.2 g/dL (ref 0.0–0.4)
Alpha-2-Globulin: 0.8 g/dL (ref 0.4–1.0)
Beta Globulin: 1.2 g/dL (ref 0.7–1.3)
Gamma Globulin: 1 g/dL (ref 0.4–1.8)
Globulin, Total: 3.2 g/dL (ref 2.2–3.9)
Total Protein ELP: 6.7 g/dL (ref 6.0–8.5)

## 2019-05-24 LAB — IMMUNOFIXATION ELECTROPHORESIS
IgA: 407 mg/dL (ref 64–422)
IgG (Immunoglobin G), Serum: 1017 mg/dL (ref 586–1602)
IgM (Immunoglobulin M), Srm: 78 mg/dL (ref 26–217)
Total Protein ELP: 6.5 g/dL (ref 6.0–8.5)

## 2019-05-30 ENCOUNTER — Encounter (HOSPITAL_COMMUNITY): Payer: Self-pay | Admitting: Hematology

## 2019-05-30 ENCOUNTER — Inpatient Hospital Stay (HOSPITAL_COMMUNITY): Payer: Medicare Other | Attending: Hematology

## 2019-05-30 ENCOUNTER — Other Ambulatory Visit: Payer: Self-pay

## 2019-05-30 ENCOUNTER — Inpatient Hospital Stay (HOSPITAL_COMMUNITY): Payer: Medicare Other

## 2019-05-30 ENCOUNTER — Inpatient Hospital Stay (HOSPITAL_BASED_OUTPATIENT_CLINIC_OR_DEPARTMENT_OTHER): Payer: Medicare Other | Admitting: Hematology

## 2019-05-30 VITALS — BP 138/60 | HR 64 | Temp 98.4°F | Resp 18 | Wt 160.5 lb

## 2019-05-30 DIAGNOSIS — E876 Hypokalemia: Secondary | ICD-10-CM

## 2019-05-30 DIAGNOSIS — C9000 Multiple myeloma not having achieved remission: Secondary | ICD-10-CM | POA: Insufficient documentation

## 2019-05-30 DIAGNOSIS — D631 Anemia in chronic kidney disease: Secondary | ICD-10-CM | POA: Insufficient documentation

## 2019-05-30 DIAGNOSIS — E119 Type 2 diabetes mellitus without complications: Secondary | ICD-10-CM | POA: Diagnosis not present

## 2019-05-30 DIAGNOSIS — I2510 Atherosclerotic heart disease of native coronary artery without angina pectoris: Secondary | ICD-10-CM | POA: Diagnosis not present

## 2019-05-30 DIAGNOSIS — N189 Chronic kidney disease, unspecified: Secondary | ICD-10-CM | POA: Diagnosis not present

## 2019-05-30 DIAGNOSIS — Z7982 Long term (current) use of aspirin: Secondary | ICD-10-CM | POA: Diagnosis not present

## 2019-05-30 DIAGNOSIS — Z7984 Long term (current) use of oral hypoglycemic drugs: Secondary | ICD-10-CM | POA: Insufficient documentation

## 2019-05-30 DIAGNOSIS — Z79899 Other long term (current) drug therapy: Secondary | ICD-10-CM | POA: Insufficient documentation

## 2019-05-30 DIAGNOSIS — Z5112 Encounter for antineoplastic immunotherapy: Secondary | ICD-10-CM | POA: Diagnosis present

## 2019-05-30 DIAGNOSIS — Z853 Personal history of malignant neoplasm of breast: Secondary | ICD-10-CM | POA: Insufficient documentation

## 2019-05-30 DIAGNOSIS — E538 Deficiency of other specified B group vitamins: Secondary | ICD-10-CM

## 2019-05-30 LAB — CBC WITH DIFFERENTIAL/PLATELET
Abs Immature Granulocytes: 0.03 10*3/uL (ref 0.00–0.07)
Basophils Absolute: 0 10*3/uL (ref 0.0–0.1)
Basophils Relative: 1 %
Eosinophils Absolute: 0.1 10*3/uL (ref 0.0–0.5)
Eosinophils Relative: 1 %
HCT: 31.5 % — ABNORMAL LOW (ref 36.0–46.0)
Hemoglobin: 10 g/dL — ABNORMAL LOW (ref 12.0–15.0)
Immature Granulocytes: 1 %
Lymphocytes Relative: 29 %
Lymphs Abs: 1.2 10*3/uL (ref 0.7–4.0)
MCH: 33.1 pg (ref 26.0–34.0)
MCHC: 31.7 g/dL (ref 30.0–36.0)
MCV: 104.3 fL — ABNORMAL HIGH (ref 80.0–100.0)
Monocytes Absolute: 0.6 10*3/uL (ref 0.1–1.0)
Monocytes Relative: 15 %
Neutro Abs: 2.2 10*3/uL (ref 1.7–7.7)
Neutrophils Relative %: 53 %
Platelets: 216 10*3/uL (ref 150–400)
RBC: 3.02 MIL/uL — ABNORMAL LOW (ref 3.87–5.11)
RDW: 16 % — ABNORMAL HIGH (ref 11.5–15.5)
WBC: 4.1 10*3/uL (ref 4.0–10.5)
nRBC: 1 % — ABNORMAL HIGH (ref 0.0–0.2)

## 2019-05-30 LAB — COMPREHENSIVE METABOLIC PANEL
ALT: 9 U/L (ref 0–44)
AST: 11 U/L — ABNORMAL LOW (ref 15–41)
Albumin: 3.9 g/dL (ref 3.5–5.0)
Alkaline Phosphatase: 54 U/L (ref 38–126)
Anion gap: 9 (ref 5–15)
BUN: 27 mg/dL — ABNORMAL HIGH (ref 8–23)
CO2: 24 mmol/L (ref 22–32)
Calcium: 9.8 mg/dL (ref 8.9–10.3)
Chloride: 107 mmol/L (ref 98–111)
Creatinine, Ser: 1.05 mg/dL — ABNORMAL HIGH (ref 0.44–1.00)
GFR calc Af Amer: 58 mL/min — ABNORMAL LOW (ref >60)
GFR calc non Af Amer: 50 mL/min — ABNORMAL LOW (ref >60)
Glucose, Bld: 138 mg/dL — ABNORMAL HIGH (ref 70–99)
Potassium: 4.1 mmol/L (ref 3.5–5.1)
Sodium: 140 mmol/L (ref 135–145)
Total Bilirubin: 0.7 mg/dL (ref 0.3–1.2)
Total Protein: 7.1 g/dL (ref 6.5–8.1)

## 2019-05-30 MED ORDER — DENOSUMAB 120 MG/1.7ML ~~LOC~~ SOLN: 120.0000 mg | Freq: Once | SUBCUTANEOUS | Status: DC

## 2019-05-30 MED ORDER — CYANOCOBALAMIN 1000 MCG/ML IJ SOLN: 1000.0000 ug | Freq: Once | INTRAMUSCULAR | Status: DC

## 2019-05-30 MED ORDER — PROCHLORPERAZINE MALEATE 10 MG PO TABS
10.0000 mg | ORAL_TABLET | Freq: Once | ORAL | Status: AC
  Administered 2019-05-30: 10 mg via ORAL

## 2019-05-30 MED ORDER — BORTEZOMIB CHEMO SQ INJECTION 3.5 MG (2.5MG/ML)
1.3000 mg/m2 | Freq: Once | INTRAMUSCULAR | Status: AC
  Administered 2019-05-30: 2.25 mg via SUBCUTANEOUS
  Filled 2019-05-30: qty 0.9

## 2019-05-30 MED ORDER — EPOETIN ALFA 20000 UNIT/ML IJ SOLN
20000.0000 [IU] | Freq: Once | INTRAMUSCULAR | Status: AC
  Administered 2019-05-30: 20000 [IU] via SUBCUTANEOUS
  Filled 2019-05-30: qty 1

## 2019-05-30 MED ORDER — PROCHLORPERAZINE MALEATE 10 MG PO TABS
ORAL_TABLET | ORAL | Status: AC
  Filled 2019-05-30: qty 1

## 2019-05-30 NOTE — Progress Notes (Signed)
[place], [place] [address]   CLINIC:  Medical Oncology/Hematology  PCP:  Vesta Mixer [address] [place] [address] [phone]   REASON FOR VISIT:  Follow-up for multiple myeloma   BRIEF ONCOLOGIC HISTORY:    Multiple myeloma not having achieved remission ([place])   01/20/2018 Initial Diagnosis    Multiple myeloma not having achieved remission ([place])    01/26/2018 -  Chemotherapy    The patient had bortezomib SQ (VELCADE) chemo injection 2.5 mg, 1.3 mg/m2 = 2.5 mg, Subcutaneous,  Once, 18 of 20 cycles Administration: 2.5 mg (01/26/2018), 2.5 mg (02/02/2018), 2.5 mg (02/09/2018), 2.5 mg (02/16/2018), 2.5 mg (02/23/2018), 2.5 mg (03/02/2018), 2.5 mg (03/09/2018), 2.5 mg (03/30/2018), 2.5 mg (04/06/2018), 2.5 mg (04/13/2018), 2.5 mg (04/21/2018), 2.5 mg (05/06/2018), 2.5 mg (05/11/2018), 2.5 mg (05/20/2018), 2.5 mg (05/27/2018), 2.5 mg (06/10/2018), 2.5 mg (06/17/2018), 2.5 mg (06/24/2018), 2.5 mg (07/08/2018), 2.5 mg (07/15/2018), 2.5 mg (07/22/2018), 2.5 mg (08/05/2018), 2.5 mg (08/12/2018), 2.5 mg (09/02/2018), 2.5 mg (09/09/2018), 2.5 mg (09/16/2018), 2.5 mg (09/30/2018), 2.5 mg (10/07/2018), 2.5 mg (10/14/2018), 2.5 mg (10/31/2018), 2.5 mg (11/07/2018), 2.5 mg (11/14/2018), 2.5 mg (11/28/2018), 2.25 mg (12/05/2018), 2.25 mg (12/12/2018), 2.25 mg (12/26/2018), 2.25 mg (01/02/2019), 2.25 mg (01/09/2019), 2.25 mg (01/23/2019), 2.25 mg (01/30/2019), 2.25 mg (02/06/2019), 2.25 mg (02/20/2019), 2.25 mg (02/27/2019), 2.25 mg (03/06/2019), 2.25 mg (03/20/2019), 2.25 mg (03/27/2019), 2.25 mg (04/03/2019), 2.25 mg (04/17/2019), 2.25 mg (04/24/2019), 2.25 mg (05/01/2019), 2.25 mg (05/15/2019), 2.25 mg (05/23/2019), 2.25 mg (05/30/2019)  for chemotherapy treatment.       CANCER STAGING: Cancer Staging No matching staging information was found for the patient.   INTERVAL HISTORY:  Brittany Archer 78 y.o. female returns for routine follow-up. She is here today alone. She states that she has been  doing well since her last visit. She continues taking her revlimid with no missed doses. Denies any nausea, vomiting, or diarrhea. Denies any new pains. Had not noticed any recent bleeding such as epistaxis, hematuria or hematochezia. Denies recent chest pain on exertion, shortness of breath on minimal exertion, pre-syncopal episodes, or palpitations. Denies any numbness or tingling in hands or feet. Denies any recent fevers, infections, or recent hospitalizations. Patient reports appetite at 100% and energy level at 100%.   REVIEW OF SYSTEMS:  Review of Systems  All other systems reviewed and are negative.    PAST MEDICAL/SURGICAL HISTORY:  Past Medical History:  Diagnosis Date  . Breast cancer ([place])    left breast/ 2008/ surg/ rad tx  . Coronary artery disease   . Diabetes mellitus    Past Surgical History:  Procedure Laterality Date  . ABDOMINAL HYSTERECTOMY    . BREAST SURGERY       SOCIAL HISTORY:  Social History   Socioeconomic History  . Marital status: Divorced    Spouse name: Not on file  . Number of children: Not on file  . Years of education: Not on file  . Highest education level: Not on file  Occupational History  . Not on file  Social Needs  . Financial resource strain: Not on file  . Food insecurity:    Worry: Not on file    Inability: Not on file  . Transportation needs:    Medical: Not on file    Non-medical: Not on file  Tobacco Use  . Smoking status: Never Smoker  . Smokeless tobacco: Never Used  Substance and Sexual Activity  . Alcohol use:  No  . Drug use: No  . Sexual activity: Yes    Birth control/protection: Surgical  Lifestyle  . Physical activity:    Days per week: Not on file    Minutes per session: Not on file  . Stress: Not on file  Relationships  . Social connections:    Talks on phone: Not on file    Gets together: Not on file    Attends religious service: Not on file    Active member of club or organization: Not on file     Attends meetings of clubs or organizations: Not on file    Relationship status: Not on file  . Intimate partner violence:    Fear of current or ex partner: Not on file    Emotionally abused: Not on file    Physically abused: Not on file    Forced sexual activity: Not on file  Other Topics Concern  . Not on file  Social History Narrative  . Not on file    FAMILY HISTORY:  Family History  Problem Relation Age of Onset  . Obesity Sister     CURRENT MEDICATIONS:  Outpatient Encounter Medications as of 05/30/2019  Medication Sig Note  . acetaminophen (TYLENOL) 500 MG tablet Take 500 mg by mouth every 6 (six) hours as needed for mild pain or moderate pain.   Marland Kitchen acyclovir (ZOVIRAX) 400 MG tablet Take 1 tablet (400 mg total) by mouth 2 (two) times daily.   Marland Kitchen aspirin 81 MG tablet Take 81 mg by mouth daily.     . bortezomib IV (VELCADE) 3.5 MG injection Inject into the vein once. weekly   . cholecalciferol (VITAMIN D) 1000 units tablet Take 1,000 Units by mouth daily.   . Denosumab (XGEVA [place]) Inject into the skin. Every 28 days   . dexamethasone (DECADRON) 4 MG tablet Take 10 tablets (40 mg) on days 1, 8, and 15 of chemo. Repeat every 21 days.   Marland Kitchen glipiZIDE (GLUCOTROL) 5 MG tablet Take 5 mg by mouth daily before breakfast.    . lenalidomide (REVLIMID) 20 MG capsule Take one capsule daily on days 1-14 every 21 days.   Marland Kitchen lisinopril-hydrochlorothiazide (PRINZIDE,ZESTORETIC) 20-25 MG tablet Take 1 tablet by mouth daily.    . metFORMIN (GLUCOPHAGE) 1000 MG tablet Take 1,000 mg by mouth 2 times daily at 12 noon and 4 pm.     . ondansetron (ZOFRAN) 8 MG tablet Take 1 tablet (8 mg total) by mouth 2 (two) times daily as needed (Nausea or vomiting).   . potassium chloride SA (K-DUR) 20 MEQ tablet TAKE (1) TABLET BY MOUTH THREE TIMES DAILY   . prochlorperazine (COMPAZINE) 10 MG tablet Take 1 tablet (10 mg total) by mouth every 6 (six) hours as needed (Nausea or vomiting).   . [EXPIRED] epoetin alfa  (EPOGEN) injection 20,000 Units    . [DISCONTINUED] cyanocobalamin ((VITAMIN B-12)) injection 1,000 mcg  05/30/2019: last dose 5/18  . [DISCONTINUED] denosumab (XGEVA) injection 120 mg  05/30/2019: last dose 5/26   No facility-administered encounter medications on file as of 05/30/2019.     ALLERGIES:  Allergies  Allergen Reactions  . Motrin [Ibuprofen] Rash     PHYSICAL EXAM:  ECOG Performance status: 1  Vitals:   05/30/19 1133  BP: 138/60  Pulse: 64  Resp: 18  Temp: 98.4 F (36.9 C)  SpO2: 100%   Filed Weights   05/30/19 1133  Weight: 160 lb 8 oz (72.8 kg)    Physical Exam Vitals signs  reviewed.  Constitutional:      Appearance: Normal appearance.  Cardiovascular:     Rate and Rhythm: Normal rate and regular rhythm.     Heart sounds: Normal heart sounds.  Pulmonary:     Effort: Pulmonary effort is normal.     Breath sounds: Normal breath sounds.  Abdominal:     General: There is no distension.     Palpations: Abdomen is soft. There is no mass.  Musculoskeletal:        General: No swelling.  Skin:    General: Skin is warm.  Neurological:     General: No focal deficit present.     Mental Status: She is alert and oriented to person, place, and time.  Psychiatric:        Mood and Affect: Mood normal.        Behavior: Behavior normal.      LABORATORY DATA:  I have reviewed the labs as listed.  CBC    Component Value Date/Time   WBC 4.1 05/30/2019 1031   RBC 3.02 (L) 05/30/2019 1031   HGB 10.0 (L) 05/30/2019 1031   HCT 31.5 (L) 05/30/2019 1031   PLT 216 05/30/2019 1031   MCV 104.3 (H) 05/30/2019 1031   MCH 33.1 05/30/2019 1031   MCHC 31.7 05/30/2019 1031   RDW 16.0 (H) 05/30/2019 1031   LYMPHSABS 1.2 05/30/2019 1031   MONOABS 0.6 05/30/2019 1031   EOSABS 0.1 05/30/2019 1031   BASOSABS 0.0 05/30/2019 1031   CMP Latest Ref Rng & Units 05/30/2019 05/23/2019 05/15/2019  Glucose 70 - 99 mg/dL 138(H) 124(H) 92  BUN 8 - 23 mg/dL 27(H) 23 19  Creatinine 0.44  - 1.00 mg/dL 1.05(H) 1.17(H) 1.07(H)  Sodium 135 - 145 mmol/L 140 139 140  Potassium 3.5 - 5.1 mmol/L 4.1 3.9 3.8  Chloride 98 - 111 mmol/L 107 104 106  CO2 22 - 32 mmol/L '24 26 24  ' Calcium 8.9 - 10.3 mg/dL 9.8 10.6(H) 9.0  Total Protein 6.5 - 8.1 g/dL 7.1 7.3 7.1  Total Bilirubin 0.3 - 1.2 mg/dL 0.7 0.7 0.8  Alkaline Phos 38 - 126 U/L 54 57 61  AST 15 - 41 U/L 11(L) 10(L) 9(L)  ALT 0 - 44 U/L '9 8 9       ' DIAGNOSTIC IMAGING:  I have independently reviewed the scans and discussed with the patient.   I have reviewed Venita Lick LPN's note and agree with the documentation.  I personally performed a face-to-face visit, made revisions and my assessment and plan is as follows.    ASSESSMENT & PLAN:   Multiple myeloma not having achieved remission ([place]) 1.  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: -Bone marrow biopsy on 01/03/2018 with 60% plasma cells, FISH panel with no abnormalities, chromosome analysis showing hyperdiploidy with gains of chromosomes 2, 3, 4, 7, 10, 15, beta-2 microglobulin 3.2, LDH normal, 0.9 g/dL of M spike at diagnosis, free light chain ratio of 114, Kappa Light chain of 1796 -Skeletal survey on 02/16/2018 showing subtle patchy areas of osteopenia of the thoracolumbar spine and possibly distal right clavicle which may reflect subtle changes of multiple myeloma - RVD (Velcade on days 1, 8 and 15, Revlimid 25 on days 1-21, dexamethasone 10 pills on day of Velcade every 28 days) cycle 1 started on 01/26/2018, cycle 4 on 04/13/2018, she is asynchronous with Revlimid and Velcade.  We have held her Revlimid once when [place] dropped to 600 during cycle 4. -  Revlimid was dose reduced to 20 mg 2 weeks on/1 week off because of cytopenias.  She takes dexamethasone 40 mg weekly. - Myeloma panel from 05/23/2019 shows SPEP was negative.  Immunofixation was negative.  However free light chain ratio has gone up to 1.91.  Previously this was 1.6.  Kappa light chains  have also gone up to 40 from 26.  -Hence I have recommended continuing current triple drug regimen.  She is tolerating it without any side effects. -I plan to repeat her myeloma panel in 1 month and see her back.  If her free light chains also become negative, then we will consider discontinuing Velcade.  2.  Bone strengthening: -She will continue monthly denosumab.  She will continue calcium and vitamin D.  3.  Hypokalemia: -Potassium is normal today.  She will continue potassium supplements.   Total time spent is 25 minutes with more than 50% of the time spent face-to-face discussing test results, treatment plan and coordination of care.    Orders placed this encounter:  Orders Placed This Encounter  Procedures  . CBC with Differential/Platelet  . Comprehensive metabolic panel  . Protein electrophoresis, serum  . Kappa/lambda light chains  . Lactate dehydrogenase  . Immunofixation electrophoresis      Derek Jack, MD [place] [phone]

## 2019-05-30 NOTE — Patient Instructions (Addendum)
[facility] at [facility] Discharge Instructions  You were seen today by Dr. Katragadda. He went over your recent lab results. He will see you back in 4 weeks for labs and follow up.   Thank you for choosing [facility] at [facility] to provide your oncology and hematology care.  To afford each patient quality time with our provider, please arrive at least 15 minutes before your scheduled appointment time.   If you have a lab appointment with the Cancer Center please come in thru the  Main Entrance and check in at the main information desk  You need to re-schedule your appointment should you arrive 10 or more minutes late.  We strive to give you quality time with our providers, and arriving late affects you and other patients whose appointments are after yours.  Also, if you no show three or more times for appointments you may be dismissed from the clinic at the providers discretion.     Again, thank you for choosing [facility].  Our hope is that these requests will decrease the amount of time that you wait before being seen by our physicians.       _____________________________________________________________  Should you have questions after your visit to [facility], please contact our office at [phone] between the hours of 8:00 a.m. and 4:30 p.m.  Voicemails left after 4:00 p.m. will not be returned until the following business day.  For prescription refill requests, have your pharmacy contact our office and allow 72 hours.    Cancer Center Support Programs:   > Cancer Support Group  2nd Tuesday of the month 1pm-2pm, Journey Room    

## 2019-05-30 NOTE — Assessment & Plan Note (Signed)
1.  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: -Bone marrow biopsy on 01/03/2018 with 60% plasma cells, FISH panel with no abnormalities, chromosome analysis showing hyperdiploidy with gains of chromosomes 2, 3, 4, 7, 10, 15, beta-2 microglobulin 3.2, LDH normal, 0.9 g/dL of M spike at diagnosis, free light chain ratio of 114, Kappa Light chain of 1796 -Skeletal survey on 02/16/2018 showing subtle patchy areas of osteopenia of the thoracolumbar spine and possibly distal right clavicle which may reflect subtle changes of multiple myeloma - RVD (Velcade on days 1, 8 and 15, Revlimid 25 on days 1-21, dexamethasone 10 pills on day of Velcade every 28 days) cycle 1 started on 01/26/2018, cycle 4 on 04/13/2018, she is asynchronous with Revlimid and Velcade.  We have held her Revlimid once when [place] dropped to 600 during cycle 4. - Revlimid was dose reduced to 20 mg 2 weeks on/1 week off because of cytopenias.  She takes dexamethasone 40 mg weekly. - Myeloma panel from 05/23/2019 shows SPEP was negative.  Immunofixation was negative.  However free light chain ratio has gone up to 1.91.  Previously this was 1.6.  Kappa light chains have also gone up to 40 from 26.  -Hence I have recommended continuing current triple drug regimen.  She is tolerating it without any side effects. -I plan to repeat her myeloma panel in 1 month and see her back.  If her free light chains also become negative, then we will consider discontinuing Velcade.  2.  Bone strengthening: -She will continue monthly denosumab.  She will continue calcium and vitamin D.  3.  Hypokalemia: -Potassium is normal today.  She will continue potassium supplements.

## 2019-05-30 NOTE — Progress Notes (Signed)
Labs reviewed during office visit today. Proceed with treatment today per MD.   Procrit and velcade given today per orders.  Patient tolerated it well without problems. Vitals stable and discharged home from clinic ambulatory. Follow up as scheduled.

## 2019-05-31 LAB — KAPPA/LAMBDA LIGHT CHAINS
Kappa free light chain: 34.9 mg/L — ABNORMAL HIGH (ref 3.3–19.4)
Kappa, lambda light chain ratio: 2.03 — ABNORMAL HIGH (ref 0.26–1.65)
Lambda free light chains: 17.2 mg/L (ref 5.7–26.3)

## 2019-05-31 LAB — PROTEIN ELECTROPHORESIS, SERUM
A/G Ratio: 1.2 (ref 0.7–1.7)
Albumin ELP: 3.6 g/dL (ref 2.9–4.4)
Alpha-1-Globulin: 0.2 g/dL (ref 0.0–0.4)
Alpha-2-Globulin: 0.9 g/dL (ref 0.4–1.0)
Beta Globulin: 1.1 g/dL (ref 0.7–1.3)
Gamma Globulin: 0.9 g/dL (ref 0.4–1.8)
Globulin, Total: 3 g/dL (ref 2.2–3.9)
Total Protein ELP: 6.6 g/dL (ref 6.0–8.5)

## 2019-06-01 LAB — IMMUNOFIXATION ELECTROPHORESIS
IgA: 400 mg/dL (ref 64–422)
IgG (Immunoglobin G), Serum: 1016 mg/dL (ref 586–1602)
IgM (Immunoglobulin M), Srm: 80 mg/dL (ref 26–217)
Total Protein ELP: 6.7 g/dL (ref 6.0–8.5)

## 2019-06-06 ENCOUNTER — Inpatient Hospital Stay (HOSPITAL_COMMUNITY): Payer: Medicare Other

## 2019-06-06 ENCOUNTER — Other Ambulatory Visit: Payer: Self-pay

## 2019-06-06 ENCOUNTER — Encounter (HOSPITAL_COMMUNITY): Payer: Self-pay

## 2019-06-06 VITALS — BP 141/61 | HR 56 | Temp 98.5°F | Resp 18

## 2019-06-06 DIAGNOSIS — E538 Deficiency of other specified B group vitamins: Secondary | ICD-10-CM

## 2019-06-06 DIAGNOSIS — C9000 Multiple myeloma not having achieved remission: Secondary | ICD-10-CM

## 2019-06-06 DIAGNOSIS — Z5111 Encounter for antineoplastic chemotherapy: Secondary | ICD-10-CM

## 2019-06-06 DIAGNOSIS — D649 Anemia, unspecified: Secondary | ICD-10-CM

## 2019-06-06 DIAGNOSIS — E876 Hypokalemia: Secondary | ICD-10-CM

## 2019-06-06 DIAGNOSIS — Z5112 Encounter for antineoplastic immunotherapy: Secondary | ICD-10-CM | POA: Diagnosis not present

## 2019-06-06 LAB — CBC WITH DIFFERENTIAL/PLATELET
Abs Immature Granulocytes: 0.01 10*3/uL (ref 0.00–0.07)
Basophils Absolute: 0 10*3/uL (ref 0.0–0.1)
Basophils Relative: 0 %
Eosinophils Absolute: 0 10*3/uL (ref 0.0–0.5)
Eosinophils Relative: 1 %
HCT: 31.8 % — ABNORMAL LOW (ref 36.0–46.0)
Hemoglobin: 10.2 g/dL — ABNORMAL LOW (ref 12.0–15.0)
Immature Granulocytes: 0 %
Lymphocytes Relative: 27 %
Lymphs Abs: 1.3 10*3/uL (ref 0.7–4.0)
MCH: 34 pg (ref 26.0–34.0)
MCHC: 32.1 g/dL (ref 30.0–36.0)
MCV: 106 fL — ABNORMAL HIGH (ref 80.0–100.0)
Monocytes Absolute: 0.5 10*3/uL (ref 0.1–1.0)
Monocytes Relative: 9 %
Neutro Abs: 3.1 10*3/uL (ref 1.7–7.7)
Neutrophils Relative %: 63 %
Platelets: 169 10*3/uL (ref 150–400)
RBC: 3 MIL/uL — ABNORMAL LOW (ref 3.87–5.11)
RDW: 17.1 % — ABNORMAL HIGH (ref 11.5–15.5)
WBC: 4.9 10*3/uL (ref 4.0–10.5)
nRBC: 0.6 % — ABNORMAL HIGH (ref 0.0–0.2)

## 2019-06-06 MED ORDER — EPOETIN ALFA 20000 UNIT/ML IJ SOLN
20000.0000 [IU] | Freq: Once | INTRAMUSCULAR | Status: AC
  Administered 2019-06-06: 20000 [IU] via SUBCUTANEOUS
  Filled 2019-06-06: qty 1

## 2019-06-06 NOTE — Progress Notes (Signed)
Patient tolerated injection with no complaints voiced.  Site clean and dry with no bruising or swelling noted at site.  Band aid applied.  Vss with discharge and left ambulatory with no s/s of distress noted.  

## 2019-06-13 ENCOUNTER — Encounter (HOSPITAL_COMMUNITY): Payer: Self-pay

## 2019-06-13 ENCOUNTER — Other Ambulatory Visit: Payer: Self-pay

## 2019-06-13 ENCOUNTER — Inpatient Hospital Stay (HOSPITAL_COMMUNITY): Payer: Medicare Other

## 2019-06-13 ENCOUNTER — Other Ambulatory Visit (HOSPITAL_COMMUNITY): Payer: Self-pay | Admitting: Nurse Practitioner

## 2019-06-13 VITALS — BP 145/75 | HR 58 | Temp 98.2°F | Resp 18

## 2019-06-13 DIAGNOSIS — Z5112 Encounter for antineoplastic immunotherapy: Secondary | ICD-10-CM | POA: Diagnosis not present

## 2019-06-13 DIAGNOSIS — Z5111 Encounter for antineoplastic chemotherapy: Secondary | ICD-10-CM

## 2019-06-13 DIAGNOSIS — E538 Deficiency of other specified B group vitamins: Secondary | ICD-10-CM

## 2019-06-13 DIAGNOSIS — D649 Anemia, unspecified: Secondary | ICD-10-CM

## 2019-06-13 DIAGNOSIS — E876 Hypokalemia: Secondary | ICD-10-CM

## 2019-06-13 DIAGNOSIS — C9000 Multiple myeloma not having achieved remission: Secondary | ICD-10-CM

## 2019-06-13 LAB — CBC WITH DIFFERENTIAL/PLATELET
Abs Immature Granulocytes: 0.02 10*3/uL (ref 0.00–0.07)
Basophils Absolute: 0 10*3/uL (ref 0.0–0.1)
Basophils Relative: 0 %
Eosinophils Absolute: 0 10*3/uL (ref 0.0–0.5)
Eosinophils Relative: 1 %
HCT: 33.6 % — ABNORMAL LOW (ref 36.0–46.0)
Hemoglobin: 10.7 g/dL — ABNORMAL LOW (ref 12.0–15.0)
Immature Granulocytes: 0 %
Lymphocytes Relative: 16 %
Lymphs Abs: 0.8 10*3/uL (ref 0.7–4.0)
MCH: 34.2 pg — ABNORMAL HIGH (ref 26.0–34.0)
MCHC: 31.8 g/dL (ref 30.0–36.0)
MCV: 107.3 fL — ABNORMAL HIGH (ref 80.0–100.0)
Monocytes Absolute: 0.5 10*3/uL (ref 0.1–1.0)
Monocytes Relative: 10 %
Neutro Abs: 3.7 10*3/uL (ref 1.7–7.7)
Neutrophils Relative %: 73 %
Platelets: 221 10*3/uL (ref 150–400)
RBC: 3.13 MIL/uL — ABNORMAL LOW (ref 3.87–5.11)
RDW: 17.7 % — ABNORMAL HIGH (ref 11.5–15.5)
WBC: 5.1 10*3/uL (ref 4.0–10.5)
nRBC: 0.4 % — ABNORMAL HIGH (ref 0.0–0.2)

## 2019-06-13 LAB — COMPREHENSIVE METABOLIC PANEL
ALT: 8 U/L (ref 0–44)
AST: 9 U/L — ABNORMAL LOW (ref 15–41)
Albumin: 3.8 g/dL (ref 3.5–5.0)
Alkaline Phosphatase: 67 U/L (ref 38–126)
Anion gap: 9 (ref 5–15)
BUN: 18 mg/dL (ref 8–23)
CO2: 27 mmol/L (ref 22–32)
Calcium: 9.5 mg/dL (ref 8.9–10.3)
Chloride: 105 mmol/L (ref 98–111)
Creatinine, Ser: 1.12 mg/dL — ABNORMAL HIGH (ref 0.44–1.00)
GFR calc Af Amer: 54 mL/min — ABNORMAL LOW (ref >60)
GFR calc non Af Amer: 47 mL/min — ABNORMAL LOW (ref >60)
Glucose, Bld: 147 mg/dL — ABNORMAL HIGH (ref 70–99)
Potassium: 3.6 mmol/L (ref 3.5–5.1)
Sodium: 141 mmol/L (ref 135–145)
Total Bilirubin: 0.8 mg/dL (ref 0.3–1.2)
Total Protein: 7 g/dL (ref 6.5–8.1)

## 2019-06-13 MED ORDER — PROCHLORPERAZINE MALEATE 10 MG PO TABS
10.0000 mg | ORAL_TABLET | Freq: Once | ORAL | Status: AC
  Administered 2019-06-13: 10 mg via ORAL
  Filled 2019-06-13: qty 1

## 2019-06-13 MED ORDER — PROCHLORPERAZINE MALEATE 10 MG PO TABS
ORAL_TABLET | ORAL | Status: AC
  Filled 2019-06-13: qty 1

## 2019-06-13 MED ORDER — CYANOCOBALAMIN 1000 MCG/ML IJ SOLN
1000.0000 ug | Freq: Once | INTRAMUSCULAR | Status: AC
  Administered 2019-06-13: 1000 ug via INTRAMUSCULAR
  Filled 2019-06-13: qty 1

## 2019-06-13 MED ORDER — BORTEZOMIB CHEMO SQ INJECTION 3.5 MG (2.5MG/ML)
1.3000 mg/m2 | Freq: Once | INTRAMUSCULAR | Status: AC
  Administered 2019-06-13: 2.25 mg via SUBCUTANEOUS
  Filled 2019-06-13: qty 0.9

## 2019-06-13 MED ORDER — EPOETIN ALFA 20000 UNIT/ML IJ SOLN
20000.0000 [IU] | Freq: Once | INTRAMUSCULAR | Status: AC
  Administered 2019-06-13: 20000 [IU] via SUBCUTANEOUS
  Filled 2019-06-13: qty 1

## 2019-06-13 NOTE — Progress Notes (Signed)
Patient tolerated injection with no complaints voiced.  Site clean and dry with no bruising or swelling noted at site.  Band aid applied.  Vss with discharge and left ambulatory with no s/s of distress noted.  

## 2019-06-20 ENCOUNTER — Inpatient Hospital Stay (HOSPITAL_COMMUNITY): Payer: Medicare Other

## 2019-06-20 ENCOUNTER — Other Ambulatory Visit: Payer: Self-pay

## 2019-06-20 VITALS — BP 142/63 | HR 59 | Temp 98.3°F | Resp 18 | Wt 166.8 lb

## 2019-06-20 DIAGNOSIS — C9000 Multiple myeloma not having achieved remission: Secondary | ICD-10-CM

## 2019-06-20 DIAGNOSIS — Z5112 Encounter for antineoplastic immunotherapy: Secondary | ICD-10-CM | POA: Diagnosis not present

## 2019-06-20 DIAGNOSIS — E538 Deficiency of other specified B group vitamins: Secondary | ICD-10-CM

## 2019-06-20 LAB — CBC WITH DIFFERENTIAL/PLATELET
Abs Immature Granulocytes: 0.01 10*3/uL (ref 0.00–0.07)
Basophils Absolute: 0 10*3/uL (ref 0.0–0.1)
Basophils Relative: 0 %
Eosinophils Absolute: 0.1 10*3/uL (ref 0.0–0.5)
Eosinophils Relative: 2 %
HCT: 31.8 % — ABNORMAL LOW (ref 36.0–46.0)
Hemoglobin: 10 g/dL — ABNORMAL LOW (ref 12.0–15.0)
Immature Granulocytes: 0 %
Lymphocytes Relative: 24 %
Lymphs Abs: 0.9 10*3/uL (ref 0.7–4.0)
MCH: 33.7 pg (ref 26.0–34.0)
MCHC: 31.4 g/dL (ref 30.0–36.0)
MCV: 107.1 fL — ABNORMAL HIGH (ref 80.0–100.0)
Monocytes Absolute: 0.5 10*3/uL (ref 0.1–1.0)
Monocytes Relative: 13 %
Neutro Abs: 2.3 10*3/uL (ref 1.7–7.7)
Neutrophils Relative %: 61 %
Platelets: 204 10*3/uL (ref 150–400)
RBC: 2.97 MIL/uL — ABNORMAL LOW (ref 3.87–5.11)
RDW: 17.3 % — ABNORMAL HIGH (ref 11.5–15.5)
WBC: 3.8 10*3/uL — ABNORMAL LOW (ref 4.0–10.5)
nRBC: 0.5 % — ABNORMAL HIGH (ref 0.0–0.2)

## 2019-06-20 LAB — COMPREHENSIVE METABOLIC PANEL
ALT: 7 U/L (ref 0–44)
AST: 11 U/L — ABNORMAL LOW (ref 15–41)
Albumin: 3.9 g/dL (ref 3.5–5.0)
Alkaline Phosphatase: 53 U/L (ref 38–126)
Anion gap: 12 (ref 5–15)
BUN: 16 mg/dL (ref 8–23)
CO2: 23 mmol/L (ref 22–32)
Calcium: 9.1 mg/dL (ref 8.9–10.3)
Chloride: 105 mmol/L (ref 98–111)
Creatinine, Ser: 1.15 mg/dL — ABNORMAL HIGH (ref 0.44–1.00)
GFR calc Af Amer: 52 mL/min — ABNORMAL LOW (ref >60)
GFR calc non Af Amer: 45 mL/min — ABNORMAL LOW (ref >60)
Glucose, Bld: 104 mg/dL — ABNORMAL HIGH (ref 70–99)
Potassium: 3.6 mmol/L (ref 3.5–5.1)
Sodium: 140 mmol/L (ref 135–145)
Total Bilirubin: 0.8 mg/dL (ref 0.3–1.2)
Total Protein: 7.3 g/dL (ref 6.5–8.1)

## 2019-06-20 LAB — LACTATE DEHYDROGENASE: LDH: 143 U/L (ref 98–192)

## 2019-06-20 MED ORDER — EPOETIN ALFA 20000 UNIT/ML IJ SOLN
20000.0000 [IU] | Freq: Once | INTRAMUSCULAR | Status: AC
  Administered 2019-06-20: 20000 [IU] via SUBCUTANEOUS
  Filled 2019-06-20: qty 1

## 2019-06-20 MED ORDER — DENOSUMAB 120 MG/1.7ML ~~LOC~~ SOLN
120.0000 mg | Freq: Once | SUBCUTANEOUS | Status: AC
  Administered 2019-06-20: 120 mg via SUBCUTANEOUS
  Filled 2019-06-20: qty 1.7

## 2019-06-20 MED ORDER — PROCHLORPERAZINE MALEATE 10 MG PO TABS
10.0000 mg | ORAL_TABLET | Freq: Once | ORAL | Status: AC
  Administered 2019-06-20: 10 mg via ORAL
  Filled 2019-06-20: qty 1

## 2019-06-20 MED ORDER — BORTEZOMIB CHEMO SQ INJECTION 3.5 MG (2.5MG/ML)
1.3000 mg/m2 | Freq: Once | INTRAMUSCULAR | Status: AC
  Administered 2019-06-20: 14:00:00 2.25 mg via SUBCUTANEOUS
  Filled 2019-06-20: qty 0.9

## 2019-06-20 MED ORDER — DEXAMETHASONE 4 MG PO TABS
40.0000 mg | ORAL_TABLET | Freq: Once | ORAL | Status: AC
  Administered 2019-06-20: 40 mg via ORAL

## 2019-06-20 NOTE — Patient Instructions (Signed)
[facility] at [facility]  Discharge Instructions:   _______________________________________________________________  Thank you for choosing [facility] at [facility] to provide your oncology and hematology care.  To afford each patient quality time with our providers, please arrive at least 15 minutes before your scheduled appointment.  You need to re-schedule your appointment if you arrive 10 or more minutes late.  We strive to give you quality time with our providers, and arriving late affects you and other patients whose appointments are after yours.  Also, if you no show three or more times for appointments you may be dismissed from the clinic.  Again, thank you for choosing [facility] at [facility]. Our hope is that these requests will allow you access to exceptional care and in a timely manner. _______________________________________________________________  If you have questions after your visit, please contact our office at [phone] between the hours of 8:30 a.m. and 5:00 p.m. Voicemails left after 4:30 p.m. will not be returned until the following business day. _______________________________________________________________  For prescription refill requests, have your pharmacy contact our office. _______________________________________________________________  Recommendations made by the consultant and any test results will be sent to your referring physician. _______________________________________________________________ 

## 2019-06-20 NOTE — Progress Notes (Signed)
Pt presents today for Velcade, XGeva, and Procrit 20,000. VS and labs within parameters for tx. No complaints of any changes since the last visit. Message sent to RNester NP for a refill on Dexamethasone for pt. Reported WBC of 3.8 to RNester NP. Pt states she is taking Calcium and Vit D supplements. Pt is taking Revlimid per Dr. Tomie China orders.   Velcade, SGeva, and Procrit given today per MD orders. Tolerated without adverse affects. Vital signs stable. No complaints at this time. Discharged from clinic ambulatory. F/U with [facility] as scheduled.

## 2019-06-21 LAB — KAPPA/LAMBDA LIGHT CHAINS
Kappa free light chain: 61.3 mg/L — ABNORMAL HIGH (ref 3.3–19.4)
Kappa, lambda light chain ratio: 2.32 — ABNORMAL HIGH (ref 0.26–1.65)
Lambda free light chains: 26.4 mg/L — ABNORMAL HIGH (ref 5.7–26.3)

## 2019-06-22 LAB — PROTEIN ELECTROPHORESIS, SERUM
A/G Ratio: 1.2 (ref 0.7–1.7)
Albumin ELP: 3.6 g/dL (ref 2.9–4.4)
Alpha-1-Globulin: 0.2 g/dL (ref 0.0–0.4)
Alpha-2-Globulin: 0.8 g/dL (ref 0.4–1.0)
Beta Globulin: 1 g/dL (ref 0.7–1.3)
Gamma Globulin: 0.9 g/dL (ref 0.4–1.8)
Globulin, Total: 3 g/dL (ref 2.2–3.9)
Total Protein ELP: 6.6 g/dL (ref 6.0–8.5)

## 2019-06-22 LAB — IMMUNOFIXATION ELECTROPHORESIS
IgA: 410 mg/dL (ref 64–422)
IgG (Immunoglobin G), Serum: 1061 mg/dL (ref 586–1602)
IgM (Immunoglobulin M), Srm: 79 mg/dL (ref 26–217)
Total Protein ELP: 6.7 g/dL (ref 6.0–8.5)

## 2019-06-27 ENCOUNTER — Inpatient Hospital Stay (HOSPITAL_BASED_OUTPATIENT_CLINIC_OR_DEPARTMENT_OTHER): Payer: Medicare Other | Admitting: Hematology

## 2019-06-27 ENCOUNTER — Inpatient Hospital Stay (HOSPITAL_COMMUNITY): Payer: Medicare Other

## 2019-06-27 ENCOUNTER — Other Ambulatory Visit: Payer: Self-pay

## 2019-06-27 DIAGNOSIS — N189 Chronic kidney disease, unspecified: Secondary | ICD-10-CM | POA: Diagnosis not present

## 2019-06-27 DIAGNOSIS — Z853 Personal history of malignant neoplasm of breast: Secondary | ICD-10-CM

## 2019-06-27 DIAGNOSIS — C9000 Multiple myeloma not having achieved remission: Secondary | ICD-10-CM

## 2019-06-27 DIAGNOSIS — I2510 Atherosclerotic heart disease of native coronary artery without angina pectoris: Secondary | ICD-10-CM

## 2019-06-27 DIAGNOSIS — D631 Anemia in chronic kidney disease: Secondary | ICD-10-CM

## 2019-06-27 DIAGNOSIS — E876 Hypokalemia: Secondary | ICD-10-CM

## 2019-06-27 DIAGNOSIS — Z79899 Other long term (current) drug therapy: Secondary | ICD-10-CM

## 2019-06-27 DIAGNOSIS — E119 Type 2 diabetes mellitus without complications: Secondary | ICD-10-CM

## 2019-06-27 DIAGNOSIS — Z7984 Long term (current) use of oral hypoglycemic drugs: Secondary | ICD-10-CM

## 2019-06-27 DIAGNOSIS — Z5112 Encounter for antineoplastic immunotherapy: Secondary | ICD-10-CM | POA: Diagnosis not present

## 2019-06-27 DIAGNOSIS — Z7982 Long term (current) use of aspirin: Secondary | ICD-10-CM

## 2019-06-27 LAB — COMPREHENSIVE METABOLIC PANEL
ALT: 8 U/L (ref 0–44)
AST: 9 U/L — ABNORMAL LOW (ref 15–41)
Albumin: 3.9 g/dL (ref 3.5–5.0)
Alkaline Phosphatase: 68 U/L (ref 38–126)
Anion gap: 8 (ref 5–15)
BUN: 21 mg/dL (ref 8–23)
CO2: 26 mmol/L (ref 22–32)
Calcium: 9.4 mg/dL (ref 8.9–10.3)
Chloride: 106 mmol/L (ref 98–111)
Creatinine, Ser: 1.49 mg/dL — ABNORMAL HIGH (ref 0.44–1.00)
GFR calc Af Amer: 38 mL/min — ABNORMAL LOW (ref >60)
GFR calc non Af Amer: 33 mL/min — ABNORMAL LOW (ref >60)
Glucose, Bld: 141 mg/dL — ABNORMAL HIGH (ref 70–99)
Potassium: 3.9 mmol/L (ref 3.5–5.1)
Sodium: 140 mmol/L (ref 135–145)
Total Bilirubin: 0.8 mg/dL (ref 0.3–1.2)
Total Protein: 7.3 g/dL (ref 6.5–8.1)

## 2019-06-27 LAB — CBC WITH DIFFERENTIAL/PLATELET
Abs Immature Granulocytes: 0.01 10*3/uL (ref 0.00–0.07)
Basophils Absolute: 0 10*3/uL (ref 0.0–0.1)
Basophils Relative: 0 %
Eosinophils Absolute: 0.1 10*3/uL (ref 0.0–0.5)
Eosinophils Relative: 1 %
HCT: 34.9 % — ABNORMAL LOW (ref 36.0–46.0)
Hemoglobin: 11.1 g/dL — ABNORMAL LOW (ref 12.0–15.0)
Immature Granulocytes: 0 %
Lymphocytes Relative: 26 %
Lymphs Abs: 1.2 10*3/uL (ref 0.7–4.0)
MCH: 33.9 pg (ref 26.0–34.0)
MCHC: 31.8 g/dL (ref 30.0–36.0)
MCV: 106.7 fL — ABNORMAL HIGH (ref 80.0–100.0)
Monocytes Absolute: 0.7 10*3/uL (ref 0.1–1.0)
Monocytes Relative: 14 %
Neutro Abs: 2.6 10*3/uL (ref 1.7–7.7)
Neutrophils Relative %: 59 %
Platelets: 197 10*3/uL (ref 150–400)
RBC: 3.27 MIL/uL — ABNORMAL LOW (ref 3.87–5.11)
RDW: 16.3 % — ABNORMAL HIGH (ref 11.5–15.5)
WBC: 4.5 10*3/uL (ref 4.0–10.5)
nRBC: 0 % (ref 0.0–0.2)

## 2019-06-27 MED ORDER — BORTEZOMIB CHEMO SQ INJECTION 3.5 MG (2.5MG/ML)
1.3000 mg/m2 | Freq: Once | INTRAMUSCULAR | Status: AC
  Administered 2019-06-27: 2.25 mg via SUBCUTANEOUS
  Filled 2019-06-27: qty 0.9

## 2019-06-27 MED ORDER — PROCHLORPERAZINE MALEATE 10 MG PO TABS
10.0000 mg | ORAL_TABLET | Freq: Once | ORAL | Status: AC
  Administered 2019-06-27: 10 mg via ORAL
  Filled 2019-06-27: qty 1

## 2019-06-27 NOTE — Progress Notes (Signed)
1440 Labs reviewed with and pt seen by Harriet Pho NP and pt approved for Velcade injection today per NP                Yves Dill Schleifer tolerated Velcade injection well without complaints or incident. Hgb 11.1 today so Procrit injection held per parameters. Pt discharged self ambulatory in satisfactory condition

## 2019-06-27 NOTE — Assessment & Plan Note (Signed)
1.  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: -Bone marrow biopsy on 01/03/2018 with 60% plasma cells, FISH panel with no abnormalities, chromosome analysis showing hyperdiploidy with gains of chromosomes 2, 3, 4, 7, 10, 15, beta-2 microglobulin 3.2, LDH normal, 0.9 g/dL of M spike at diagnosis, free light chain ratio of 114, Kappa Light chain of 1796 -Skeletal survey on 02/16/2018 showing subtle patchy areas of osteopenia of the thoracolumbar spine and possibly distal right clavicle which may reflect subtle changes of multiple myeloma - RVD (Velcade on days 1, 8 and 15, Revlimid 25 on days 1-21, dexamethasone 10 pills on day of Velcade every 28 days) cycle 1 started on 01/26/2018, cycle 4 on 04/13/2018, she is asynchronous with Revlimid and Velcade.  We have held her Revlimid once when [place] dropped to 600 during cycle 4. - Revlimid was dose reduced to 20 mg 2 weeks on/1 week off because of cytopenias.  She takes dexamethasone 40 mg weekly. - Myeloma panel from 05/23/2019 shows SPEP was negative.  Immunofixation was negative.  However free light chain ratio has gone up to 1.91.  Previously this was 1.6.  Kappa light chains have also gone up to 40 from 26.  -Hence I have recommended continuing current triple drug regimen.  She is tolerating it without any side effects. - Multiple myeloma labs are pending from today.  Labs are stable to proceed with Velcade injection today.  Recommend patient continue on RVD therapy. -She will return to clinic in 4 weeks.  2.  Bone strengthening: -She will continue monthly denosumab.  She will continue calcium and vitamin D.  3.  Hypokalemia: -Potassium is normal today.  She will continue potassium supplements.

## 2019-06-27 NOTE — Progress Notes (Signed)
[place], [place] [id number]   CLINIC:  Medical Oncology/Hematology  PCP:  Vesta Mixer [address] [place] [address] [phone]   REASON FOR VISIT:  Follow-up for IgA Kappa Multiple Myeloma   CURRENT THERAPY: RVD  BRIEF ONCOLOGIC HISTORY:  Oncology History  Multiple myeloma not having achieved remission ([facility])  01/20/2018 Initial Diagnosis   Multiple myeloma not having achieved remission ([facility])   01/26/2018 -  Chemotherapy   The patient had bortezomib SQ (VELCADE) chemo injection 2.5 mg, 1.3 mg/m2 = 2.5 mg, Subcutaneous,  Once, 19 of 20 cycles Administration: 2.5 mg (01/26/2018), 2.5 mg (02/02/2018), 2.5 mg (02/09/2018), 2.5 mg (02/16/2018), 2.5 mg (02/23/2018), 2.5 mg (03/02/2018), 2.5 mg (03/09/2018), 2.5 mg (03/30/2018), 2.5 mg (04/06/2018), 2.5 mg (04/13/2018), 2.5 mg (04/21/2018), 2.5 mg (05/06/2018), 2.5 mg (05/11/2018), 2.5 mg (05/20/2018), 2.5 mg (05/27/2018), 2.5 mg (06/10/2018), 2.5 mg (06/17/2018), 2.5 mg (06/24/2018), 2.5 mg (07/08/2018), 2.5 mg (07/15/2018), 2.5 mg (07/22/2018), 2.5 mg (08/05/2018), 2.5 mg (08/12/2018), 2.5 mg (09/02/2018), 2.5 mg (09/09/2018), 2.5 mg (09/16/2018), 2.5 mg (09/30/2018), 2.5 mg (10/07/2018), 2.5 mg (10/14/2018), 2.5 mg (10/31/2018), 2.5 mg (11/07/2018), 2.5 mg (11/14/2018), 2.5 mg (11/28/2018), 2.25 mg (12/05/2018), 2.25 mg (12/12/2018), 2.25 mg (12/26/2018), 2.25 mg (01/02/2019), 2.25 mg (01/09/2019), 2.25 mg (01/23/2019), 2.25 mg (01/30/2019), 2.25 mg (02/06/2019), 2.25 mg (02/20/2019), 2.25 mg (02/27/2019), 2.25 mg (03/06/2019), 2.25 mg (03/20/2019), 2.25 mg (03/27/2019), 2.25 mg (04/03/2019), 2.25 mg (04/17/2019), 2.25 mg (04/24/2019), 2.25 mg (05/01/2019), 2.25 mg (05/15/2019), 2.25 mg (05/23/2019), 2.25 mg (05/30/2019), 2.25 mg (06/13/2019), 2.25 mg (06/20/2019)  for chemotherapy treatment.         INTERVAL HISTORY:  Ms. Stallworth 78 y.o. female presents today for follow up. Reports over all doing well. Denies any significant fatigue.  Currently, on RVD, tolerating well. Denies any constipation or diarrhea. Denies any peripheral neuropathies.  Energy levels are adequate.  Appetite is stable.  She is here for repeat labs and treatment.    REVIEW OF SYSTEMS:  Review of Systems  All other systems reviewed and are negative.    PAST MEDICAL/SURGICAL HISTORY:  Past Medical History:  Diagnosis Date  . Breast cancer ([facility])    left breast/ 2008/ surg/ rad tx  . Coronary artery disease   . Diabetes mellitus    Past Surgical History:  Procedure Laterality Date  . ABDOMINAL HYSTERECTOMY    . BREAST SURGERY       SOCIAL HISTORY:  Social History   Socioeconomic History  . Marital status: Divorced    Spouse name: Not on file  . Number of children: Not on file  . Years of education: Not on file  . Highest education level: Not on file  Occupational History  . Not on file  Social Needs  . Financial resource strain: Not on file  . Food insecurity    Worry: Not on file    Inability: Not on file  . Transportation needs    Medical: Not on file    Non-medical: Not on file  Tobacco Use  . Smoking status: Never Smoker  . Smokeless tobacco: Never Used  Substance and Sexual Activity  . Alcohol use: No  . Drug use: No  . Sexual activity: Yes    Birth control/protection: Surgical  Lifestyle  . Physical activity    Days per week: Not on file    Minutes per session: Not on file  . Stress: Not on file  Relationships  . Social  connections    Talks on phone: Not on file    Gets together: Not on file    Attends religious service: Not on file    Active member of club or organization: Not on file    Attends meetings of clubs or organizations: Not on file    Relationship status: Not on file  . Intimate partner violence    Fear of current or ex partner: Not on file    Emotionally abused: Not on file    Physically abused: Not on file    Forced sexual activity: Not on file  Other Topics Concern  . Not on file  Social  History Narrative  . Not on file    FAMILY HISTORY:  Family History  Problem Relation Age of Onset  . Obesity Sister     CURRENT MEDICATIONS:  Outpatient Encounter Medications as of 06/27/2019  Medication Sig  . acetaminophen (TYLENOL) 500 MG tablet Take 500 mg by mouth every 6 (six) hours as needed for mild pain or moderate pain.  Marland Kitchen acyclovir (ZOVIRAX) 400 MG tablet Take 1 tablet (400 mg total) by mouth 2 (two) times daily.  Marland Kitchen aspirin 81 MG tablet Take 81 mg by mouth daily.    . bortezomib IV (VELCADE) 3.5 MG injection Inject into the vein once. weekly  . cholecalciferol (VITAMIN D) 1000 units tablet Take 1,000 Units by mouth daily.  . Denosumab (XGEVA [place]) Inject into the skin. Every 28 days  . dexamethasone (DECADRON) 4 MG tablet Take 10 tablets (40 mg) on days 1, 8, and 15 of chemo. Repeat every 21 days.  Marland Kitchen glipiZIDE (GLUCOTROL) 5 MG tablet Take 5 mg by mouth daily before breakfast.   . lisinopril-hydrochlorothiazide (PRINZIDE,ZESTORETIC) 20-25 MG tablet Take 1 tablet by mouth daily.   . metFORMIN (GLUCOPHAGE) 1000 MG tablet Take 1,000 mg by mouth 2 times daily at 12 noon and 4 pm.    . ondansetron (ZOFRAN) 8 MG tablet Take 1 tablet (8 mg total) by mouth 2 (two) times daily as needed (Nausea or vomiting).  . potassium chloride SA (K-DUR) 20 MEQ tablet TAKE (1) TABLET BY MOUTH THREE TIMES DAILY  . prochlorperazine (COMPAZINE) 10 MG tablet Take 1 tablet (10 mg total) by mouth every 6 (six) hours as needed (Nausea or vomiting).  . REVLIMID 20 MG capsule Take 1 capsule by mouth once daily for 14 days on, and 7 days off of a 21 day cycle.   No facility-administered encounter medications on file as of 06/27/2019.     ALLERGIES:  Allergies  Allergen Reactions  . Motrin [Ibuprofen] Rash     PHYSICAL EXAM:  ECOG Performance status: 1  Vitals:   06/27/19 1343  BP: (!) 154/66  Pulse: 64  Resp: 18  Temp: (!) 97.1 F (36.2 C)  SpO2: 99%   Filed Weights   06/27/19 1343  Weight:  163 lb 6.4 oz (74.1 kg)    Physical Exam Vitals signs reviewed.  Constitutional:      Appearance: Normal appearance. She is obese.  HENT:     Head: Normocephalic.     Nose: Nose normal.     Mouth/Throat:     Mouth: Mucous membranes are moist.     Pharynx: Oropharynx is clear.  Eyes:     Extraocular Movements: Extraocular movements intact.     Conjunctiva/sclera: Conjunctivae normal.  Neck:     Musculoskeletal: Normal range of motion.  Cardiovascular:     Rate and Rhythm: Normal rate and regular rhythm.  Pulses: Normal pulses.     Heart sounds: Normal heart sounds.  Pulmonary:     Effort: Pulmonary effort is normal.     Breath sounds: Normal breath sounds.  Abdominal:     General: Bowel sounds are normal.     Palpations: Abdomen is soft.  Musculoskeletal: Normal range of motion.  Skin:    General: Skin is warm and dry.  Neurological:     General: No focal deficit present.     Mental Status: She is alert and oriented to person, place, and time. Mental status is at baseline.  Psychiatric:        Mood and Affect: Mood normal.        Behavior: Behavior normal.        Thought Content: Thought content normal.        Judgment: Judgment normal.      LABORATORY DATA:  I have reviewed the labs as listed.  CBC    Component Value Date/Time   WBC 4.5 06/27/2019 1311   RBC 3.27 (L) 06/27/2019 1311   HGB 11.1 (L) 06/27/2019 1311   HCT 34.9 (L) 06/27/2019 1311   PLT 197 06/27/2019 1311   MCV 106.7 (H) 06/27/2019 1311   MCH 33.9 06/27/2019 1311   MCHC 31.8 06/27/2019 1311   RDW 16.3 (H) 06/27/2019 1311   LYMPHSABS 1.2 06/27/2019 1311   MONOABS 0.7 06/27/2019 1311   EOSABS 0.1 06/27/2019 1311   BASOSABS 0.0 06/27/2019 1311   CMP Latest Ref Rng & Units 06/27/2019 06/20/2019 06/13/2019  Glucose 70 - 99 mg/dL 141(H) 104(H) 147(H)  BUN 8 - 23 mg/dL '21 16 18  ' Creatinine 0.44 - 1.00 mg/dL 1.49(H) 1.15(H) 1.12(H)  Sodium 135 - 145 mmol/L 140 140 141  Potassium 3.5 - 5.1  mmol/L 3.9 3.6 3.6  Chloride 98 - 111 mmol/L 106 105 105  CO2 22 - 32 mmol/L '26 23 27  ' Calcium 8.9 - 10.3 mg/dL 9.4 9.1 9.5  Total Protein 6.5 - 8.1 g/dL 7.3 7.3 7.0  Total Bilirubin 0.3 - 1.2 mg/dL 0.8 0.8 0.8  Alkaline Phos 38 - 126 U/L 68 53 67  AST 15 - 41 U/L 9(L) 11(L) 9(L)  ALT 0 - 44 U/L '8 7 8         ' ASSESSMENT & PLAN:   Multiple myeloma not having achieved remission ([facility]) 1.  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: -Bone marrow biopsy on 01/03/2018 with 60% plasma cells, FISH panel with no abnormalities, chromosome analysis showing hyperdiploidy with gains of chromosomes 2, 3, 4, 7, 10, 15, beta-2 microglobulin 3.2, LDH normal, 0.9 g/dL of M spike at diagnosis, free light chain ratio of 114, Kappa Light chain of 1796 -Skeletal survey on 02/16/2018 showing subtle patchy areas of osteopenia of the thoracolumbar spine and possibly distal right clavicle which may reflect subtle changes of multiple myeloma - RVD (Velcade on days 1, 8 and 15, Revlimid 25 on days 1-21, dexamethasone 10 pills on day of Velcade every 28 days) cycle 1 started on 01/26/2018, cycle 4 on 04/13/2018, she is asynchronous with Revlimid and Velcade.  We have held her Revlimid once when [place] dropped to 600 during cycle 4. - Revlimid was dose reduced to 20 mg 2 weeks on/1 week off because of cytopenias.  She takes dexamethasone 40 mg weekly. - Myeloma panel from 05/23/2019 shows SPEP was negative.  Immunofixation was negative.  However free light chain ratio has gone up to 1.91.  Previously this  was 1.6.  Kappa light chains have also gone up to 40 from 26.  -Hence I have recommended continuing current triple drug regimen.  She is tolerating it without any side effects. - Multiple myeloma labs are pending from today.  Labs are stable to proceed with Velcade injection today.  Recommend patient continue on RVD therapy. -She will return to clinic in 4 weeks.  2.  Bone strengthening: -She will  continue monthly denosumab.  She will continue calcium and vitamin D.  3.  Hypokalemia: -Potassium is normal today.  She will continue potassium supplements.       Orders placed this encounter:  Orders Placed This Encounter  Procedures  . CBC with Differential  . Comprehensive metabolic panel  . Multiple Myeloma Panel (SPEP&IFE w/QIG)  . Kappa/lambda light chains      Roger Shelter, [place] [phone]

## 2019-06-27 NOTE — Patient Instructions (Signed)
[facility] Discharge Instructions for Patients Receiving Chemotherapy   Beginning January 23rd 2017 lab work for the Cancer Center will be done in the  Main lab at [facility] on 1st floor. If you have a lab appointment with the Cancer Center please come in thru the  Main Entrance and check in at the main information desk   Today you received the following chemotherapy agents Velcade injection. Follow-up as scheduled. Call clinic for any questions or concerns  To help prevent nausea and vomiting after your treatment, we encourage you to take your nausea medication   If you develop nausea and vomiting, or diarrhea that is not controlled by your medication, call the clinic.  The clinic phone number is [phone]. Office hours are Monday-Friday 8:30am-5:00pm.  BELOW ARE SYMPTOMS THAT SHOULD BE REPORTED IMMEDIATELY:  *FEVER GREATER THAN 101.0 F  *CHILLS WITH OR WITHOUT FEVER  NAUSEA AND VOMITING THAT IS NOT CONTROLLED WITH YOUR NAUSEA MEDICATION  *UNUSUAL SHORTNESS OF BREATH  *UNUSUAL BRUISING OR BLEEDING  TENDERNESS IN MOUTH AND THROAT WITH OR WITHOUT PRESENCE OF ULCERS  *URINARY PROBLEMS  *BOWEL PROBLEMS  UNUSUAL RASH Items with * indicate a potential emergency and should be followed up as soon as possible. If you have an emergency after office hours please contact your primary care physician or go to the nearest emergency department.  Please call the clinic during office hours if you have any questions or concerns.   You may also contact the Patient Navigator at [phone] should you have any questions or need assistance in obtaining follow up care.      Resources For Cancer Patients and their Caregivers ? American Cancer Society: Can assist with transportation, wigs, general needs, runs Look Good Feel Better.        1-888-227-6333 ? Cancer Care: Provides financial assistance, online support groups, medication/co-pay assistance.   1-800-813-HOPE (4673) ? [facility] Assists [place] cancer patients and their families through emotional , educational and financial support.  336-427-4357 ? [place] DSS Where to apply for food stamps, Medicaid and utility assistance. 336-342-1394 ? [facility]: Transportation to medical appointments. 336-347-2287 ? Social Security Administration: May apply for disability if have a Stage IV cancer. 336-342-7796 1-800-772-1213 ? [place] Aging, Disability and Transit Services: Assists with nutrition, care and transit needs. 336-349-2343         

## 2019-06-28 LAB — PROTEIN ELECTROPHORESIS, SERUM
A/G Ratio: 1.2 (ref 0.7–1.7)
Albumin ELP: 3.7 g/dL (ref 2.9–4.4)
Alpha-1-Globulin: 0.2 g/dL (ref 0.0–0.4)
Alpha-2-Globulin: 0.9 g/dL (ref 0.4–1.0)
Beta Globulin: 1 g/dL (ref 0.7–1.3)
Gamma Globulin: 0.9 g/dL (ref 0.4–1.8)
Globulin, Total: 3 g/dL (ref 2.2–3.9)
Total Protein ELP: 6.7 g/dL (ref 6.0–8.5)

## 2019-06-28 LAB — KAPPA/LAMBDA LIGHT CHAINS
Kappa free light chain: 35 mg/L — ABNORMAL HIGH (ref 3.3–19.4)
Kappa, lambda light chain ratio: 1.85 — ABNORMAL HIGH (ref 0.26–1.65)
Lambda free light chains: 18.9 mg/L (ref 5.7–26.3)

## 2019-06-29 LAB — MULTIPLE MYELOMA PANEL, SERUM
Albumin SerPl Elph-Mcnc: 3.5 g/dL (ref 2.9–4.4)
Albumin/Glob SerPl: 1.3 (ref 0.7–1.7)
Alpha 1: 0.2 g/dL (ref 0.0–0.4)
Alpha2 Glob SerPl Elph-Mcnc: 0.8 g/dL (ref 0.4–1.0)
B-Globulin SerPl Elph-Mcnc: 1 g/dL (ref 0.7–1.3)
Gamma Glob SerPl Elph-Mcnc: 0.8 g/dL (ref 0.4–1.8)
Globulin, Total: 2.8 g/dL (ref 2.2–3.9)
IgA: 414 mg/dL (ref 64–422)
IgG (Immunoglobin G), Serum: 1020 mg/dL (ref 586–1602)
IgM (Immunoglobulin M), Srm: 83 mg/dL (ref 26–217)
Total Protein ELP: 6.3 g/dL (ref 6.0–8.5)

## 2019-06-29 LAB — IMMUNOFIXATION ELECTROPHORESIS
IgA: 421 mg/dL (ref 64–422)
IgG (Immunoglobin G), Serum: 1038 mg/dL (ref 586–1602)
IgM (Immunoglobulin M), Srm: 87 mg/dL (ref 26–217)
Total Protein ELP: 6.9 g/dL (ref 6.0–8.5)

## 2019-07-04 ENCOUNTER — Inpatient Hospital Stay (HOSPITAL_COMMUNITY): Payer: Medicare Other | Attending: Hematology

## 2019-07-04 ENCOUNTER — Other Ambulatory Visit: Payer: Self-pay

## 2019-07-04 ENCOUNTER — Inpatient Hospital Stay (HOSPITAL_COMMUNITY): Payer: Medicare Other

## 2019-07-04 DIAGNOSIS — E538 Deficiency of other specified B group vitamins: Secondary | ICD-10-CM | POA: Insufficient documentation

## 2019-07-04 DIAGNOSIS — N189 Chronic kidney disease, unspecified: Secondary | ICD-10-CM | POA: Diagnosis present

## 2019-07-04 DIAGNOSIS — D631 Anemia in chronic kidney disease: Secondary | ICD-10-CM | POA: Diagnosis not present

## 2019-07-04 DIAGNOSIS — Z5112 Encounter for antineoplastic immunotherapy: Secondary | ICD-10-CM | POA: Insufficient documentation

## 2019-07-04 DIAGNOSIS — C9000 Multiple myeloma not having achieved remission: Secondary | ICD-10-CM | POA: Diagnosis present

## 2019-07-04 LAB — COMPREHENSIVE METABOLIC PANEL
ALT: 8 U/L (ref 0–44)
AST: 11 U/L — ABNORMAL LOW (ref 15–41)
Albumin: 3.7 g/dL (ref 3.5–5.0)
Alkaline Phosphatase: 80 U/L (ref 38–126)
Anion gap: 9 (ref 5–15)
BUN: 16 mg/dL (ref 8–23)
CO2: 28 mmol/L (ref 22–32)
Calcium: 9.9 mg/dL (ref 8.9–10.3)
Chloride: 106 mmol/L (ref 98–111)
Creatinine, Ser: 1.12 mg/dL — ABNORMAL HIGH (ref 0.44–1.00)
GFR calc Af Amer: 54 mL/min — ABNORMAL LOW (ref >60)
GFR calc non Af Amer: 47 mL/min — ABNORMAL LOW (ref >60)
Glucose, Bld: 209 mg/dL — ABNORMAL HIGH (ref 70–99)
Potassium: 3.8 mmol/L (ref 3.5–5.1)
Sodium: 143 mmol/L (ref 135–145)
Total Bilirubin: 0.8 mg/dL (ref 0.3–1.2)
Total Protein: 6.7 g/dL (ref 6.5–8.1)

## 2019-07-04 LAB — CBC WITH DIFFERENTIAL/PLATELET
Abs Immature Granulocytes: 0.02 10*3/uL (ref 0.00–0.07)
Basophils Absolute: 0 10*3/uL (ref 0.0–0.1)
Basophils Relative: 1 %
Eosinophils Absolute: 0.1 10*3/uL (ref 0.0–0.5)
Eosinophils Relative: 2 %
HCT: 34.1 % — ABNORMAL LOW (ref 36.0–46.0)
Hemoglobin: 11.1 g/dL — ABNORMAL LOW (ref 12.0–15.0)
Immature Granulocytes: 1 %
Lymphocytes Relative: 22 %
Lymphs Abs: 1 10*3/uL (ref 0.7–4.0)
MCH: 34.3 pg — ABNORMAL HIGH (ref 26.0–34.0)
MCHC: 32.6 g/dL (ref 30.0–36.0)
MCV: 105.2 fL — ABNORMAL HIGH (ref 80.0–100.0)
Monocytes Absolute: 0.3 10*3/uL (ref 0.1–1.0)
Monocytes Relative: 7 %
Neutro Abs: 2.9 10*3/uL (ref 1.7–7.7)
Neutrophils Relative %: 67 %
Platelets: 202 10*3/uL (ref 150–400)
RBC: 3.24 MIL/uL — ABNORMAL LOW (ref 3.87–5.11)
RDW: 15.2 % (ref 11.5–15.5)
WBC: 4.3 10*3/uL (ref 4.0–10.5)
nRBC: 0 % (ref 0.0–0.2)

## 2019-07-04 NOTE — Progress Notes (Signed)
Hgb 11.1 today.  Patient did not receive Epogen today.  Patient verbalized understanding and returns next week.  No s/s of distress noted.

## 2019-07-11 ENCOUNTER — Encounter (HOSPITAL_COMMUNITY): Payer: Self-pay

## 2019-07-11 ENCOUNTER — Inpatient Hospital Stay (HOSPITAL_COMMUNITY): Payer: Medicare Other

## 2019-07-11 ENCOUNTER — Other Ambulatory Visit: Payer: Self-pay

## 2019-07-11 VITALS — BP 158/67 | HR 65 | Temp 97.1°F | Resp 18 | Wt 166.8 lb

## 2019-07-11 DIAGNOSIS — C9000 Multiple myeloma not having achieved remission: Secondary | ICD-10-CM

## 2019-07-11 DIAGNOSIS — E538 Deficiency of other specified B group vitamins: Secondary | ICD-10-CM

## 2019-07-11 DIAGNOSIS — D649 Anemia, unspecified: Secondary | ICD-10-CM

## 2019-07-11 DIAGNOSIS — Z5111 Encounter for antineoplastic chemotherapy: Secondary | ICD-10-CM

## 2019-07-11 DIAGNOSIS — E876 Hypokalemia: Secondary | ICD-10-CM

## 2019-07-11 DIAGNOSIS — Z5112 Encounter for antineoplastic immunotherapy: Secondary | ICD-10-CM | POA: Diagnosis not present

## 2019-07-11 LAB — CBC WITH DIFFERENTIAL/PLATELET
Abs Immature Granulocytes: 0.01 10*3/uL (ref 0.00–0.07)
Basophils Absolute: 0.1 10*3/uL (ref 0.0–0.1)
Basophils Relative: 1 %
Eosinophils Absolute: 0.1 10*3/uL (ref 0.0–0.5)
Eosinophils Relative: 2 %
HCT: 33.9 % — ABNORMAL LOW (ref 36.0–46.0)
Hemoglobin: 10.6 g/dL — ABNORMAL LOW (ref 12.0–15.0)
Immature Granulocytes: 0 %
Lymphocytes Relative: 26 %
Lymphs Abs: 1.3 10*3/uL (ref 0.7–4.0)
MCH: 33.1 pg (ref 26.0–34.0)
MCHC: 31.3 g/dL (ref 30.0–36.0)
MCV: 105.9 fL — ABNORMAL HIGH (ref 80.0–100.0)
Monocytes Absolute: 0.5 10*3/uL (ref 0.1–1.0)
Monocytes Relative: 10 %
Neutro Abs: 2.9 10*3/uL (ref 1.7–7.7)
Neutrophils Relative %: 61 %
Platelets: 245 10*3/uL (ref 150–400)
RBC: 3.2 MIL/uL — ABNORMAL LOW (ref 3.87–5.11)
RDW: 14.6 % (ref 11.5–15.5)
WBC: 4.8 10*3/uL (ref 4.0–10.5)
nRBC: 0 % (ref 0.0–0.2)

## 2019-07-11 LAB — COMPREHENSIVE METABOLIC PANEL
ALT: 10 U/L (ref 0–44)
AST: 12 U/L — ABNORMAL LOW (ref 15–41)
Albumin: 3.8 g/dL (ref 3.5–5.0)
Alkaline Phosphatase: 66 U/L (ref 38–126)
Anion gap: 8 (ref 5–15)
BUN: 18 mg/dL (ref 8–23)
CO2: 26 mmol/L (ref 22–32)
Calcium: 8.9 mg/dL (ref 8.9–10.3)
Chloride: 102 mmol/L (ref 98–111)
Creatinine, Ser: 1.03 mg/dL — ABNORMAL HIGH (ref 0.44–1.00)
GFR calc Af Amer: 60 mL/min — ABNORMAL LOW (ref >60)
GFR calc non Af Amer: 52 mL/min — ABNORMAL LOW (ref >60)
Glucose, Bld: 131 mg/dL — ABNORMAL HIGH (ref 70–99)
Potassium: 3.1 mmol/L — ABNORMAL LOW (ref 3.5–5.1)
Sodium: 136 mmol/L (ref 135–145)
Total Bilirubin: 0.6 mg/dL (ref 0.3–1.2)
Total Protein: 7.2 g/dL (ref 6.5–8.1)

## 2019-07-11 MED ORDER — POTASSIUM CHLORIDE CRYS ER 20 MEQ PO TBCR
40.0000 meq | EXTENDED_RELEASE_TABLET | Freq: Once | ORAL | Status: AC
  Administered 2019-07-11: 40 meq via ORAL
  Filled 2019-07-11: qty 2

## 2019-07-11 MED ORDER — DEXAMETHASONE 4 MG PO TABS
40.0000 mg | ORAL_TABLET | Freq: Once | ORAL | Status: AC
  Administered 2019-07-11: 40 mg via ORAL

## 2019-07-11 MED ORDER — DEXAMETHASONE 4 MG PO TABS
ORAL_TABLET | ORAL | Status: AC
  Filled 2019-07-11: qty 10

## 2019-07-11 MED ORDER — CYANOCOBALAMIN 1000 MCG/ML IJ SOLN
1000.0000 ug | Freq: Once | INTRAMUSCULAR | Status: AC
  Administered 2019-07-11: 1000 ug via INTRAMUSCULAR
  Filled 2019-07-11: qty 1

## 2019-07-11 MED ORDER — EPOETIN ALFA 20000 UNIT/ML IJ SOLN
20000.0000 [IU] | Freq: Once | INTRAMUSCULAR | Status: AC
  Administered 2019-07-11: 20000 [IU] via SUBCUTANEOUS
  Filled 2019-07-11: qty 1

## 2019-07-11 MED ORDER — PROCHLORPERAZINE MALEATE 10 MG PO TABS
10.0000 mg | ORAL_TABLET | Freq: Once | ORAL | Status: AC
  Administered 2019-07-11: 10 mg via ORAL

## 2019-07-11 MED ORDER — PROCHLORPERAZINE MALEATE 10 MG PO TABS
ORAL_TABLET | ORAL | Status: AC
  Filled 2019-07-11: qty 1

## 2019-07-11 MED ORDER — BORTEZOMIB CHEMO SQ INJECTION 3.5 MG (2.5MG/ML)
1.3000 mg/m2 | Freq: Once | INTRAMUSCULAR | Status: AC
  Administered 2019-07-11: 2.25 mg via SUBCUTANEOUS
  Filled 2019-07-11: qty 0.9

## 2019-07-11 NOTE — Patient Instructions (Signed)
[facility] Discharge Instructions for Patients Receiving Chemotherapy  Today you received the following chemotherapy agents   To help prevent nausea and vomiting after your treatment, we encourage you to take your nausea medication   If you develop nausea and vomiting that is not controlled by your nausea medication, call the clinic.   BELOW ARE SYMPTOMS THAT SHOULD BE REPORTED IMMEDIATELY:  *FEVER GREATER THAN 100.5 F  *CHILLS WITH OR WITHOUT FEVER  NAUSEA AND VOMITING THAT IS NOT CONTROLLED WITH YOUR NAUSEA MEDICATION  *UNUSUAL SHORTNESS OF BREATH  *UNUSUAL BRUISING OR BLEEDING  TENDERNESS IN MOUTH AND THROAT WITH OR WITHOUT PRESENCE OF ULCERS  *URINARY PROBLEMS  *BOWEL PROBLEMS  UNUSUAL RASH Items with * indicate a potential emergency and should be followed up as soon as possible.  Feel free to call the clinic should you have any questions or concerns. The clinic phone number is [phone].  Please show the CHEMO ALERT CARD at check-in to the Emergency Department and triage nurse.   

## 2019-07-11 NOTE — Progress Notes (Signed)
Labs reviewed with MD today. Will given PO potassium supplement per MD. Proceed per MD with velcade treatment today.   Velcade, B12, procrit given today per orders. Patient tolerated it well without problems. Vitals stable and discharged home from clinic ambulatory. Follow up as scheduled.

## 2019-07-13 ENCOUNTER — Other Ambulatory Visit (HOSPITAL_COMMUNITY): Payer: Self-pay | Admitting: *Deleted

## 2019-07-13 DIAGNOSIS — C9000 Multiple myeloma not having achieved remission: Secondary | ICD-10-CM

## 2019-07-13 MED ORDER — LENALIDOMIDE 20 MG PO CAPS: ORAL_CAPSULE | ORAL | 0 refills | Status: DC

## 2019-07-18 ENCOUNTER — Other Ambulatory Visit: Payer: Self-pay

## 2019-07-18 ENCOUNTER — Inpatient Hospital Stay (HOSPITAL_COMMUNITY): Payer: Medicare Other

## 2019-07-18 VITALS — BP 145/63 | HR 72 | Temp 97.1°F | Resp 18 | Wt 161.4 lb

## 2019-07-18 DIAGNOSIS — Z5112 Encounter for antineoplastic immunotherapy: Secondary | ICD-10-CM | POA: Diagnosis not present

## 2019-07-18 DIAGNOSIS — E876 Hypokalemia: Secondary | ICD-10-CM

## 2019-07-18 DIAGNOSIS — Z5111 Encounter for antineoplastic chemotherapy: Secondary | ICD-10-CM

## 2019-07-18 DIAGNOSIS — D649 Anemia, unspecified: Secondary | ICD-10-CM

## 2019-07-18 DIAGNOSIS — E538 Deficiency of other specified B group vitamins: Secondary | ICD-10-CM

## 2019-07-18 DIAGNOSIS — C9000 Multiple myeloma not having achieved remission: Secondary | ICD-10-CM

## 2019-07-18 LAB — CBC WITH DIFFERENTIAL/PLATELET
Abs Immature Granulocytes: 0.03 10*3/uL (ref 0.00–0.07)
Basophils Absolute: 0 10*3/uL (ref 0.0–0.1)
Basophils Relative: 0 %
Eosinophils Absolute: 0.1 10*3/uL (ref 0.0–0.5)
Eosinophils Relative: 2 %
HCT: 34.5 % — ABNORMAL LOW (ref 36.0–46.0)
Hemoglobin: 11.3 g/dL — ABNORMAL LOW (ref 12.0–15.0)
Immature Granulocytes: 1 %
Lymphocytes Relative: 23 %
Lymphs Abs: 1.2 10*3/uL (ref 0.7–4.0)
MCH: 33.8 pg (ref 26.0–34.0)
MCHC: 32.8 g/dL (ref 30.0–36.0)
MCV: 103.3 fL — ABNORMAL HIGH (ref 80.0–100.0)
Monocytes Absolute: 0.6 10*3/uL (ref 0.1–1.0)
Monocytes Relative: 11 %
Neutro Abs: 3.2 10*3/uL (ref 1.7–7.7)
Neutrophils Relative %: 63 %
Platelets: 178 10*3/uL (ref 150–400)
RBC: 3.34 MIL/uL — ABNORMAL LOW (ref 3.87–5.11)
RDW: 15.2 % (ref 11.5–15.5)
WBC: 5.2 10*3/uL (ref 4.0–10.5)
nRBC: 0 % (ref 0.0–0.2)

## 2019-07-18 LAB — COMPREHENSIVE METABOLIC PANEL
ALT: 8 U/L (ref 0–44)
AST: 11 U/L — ABNORMAL LOW (ref 15–41)
Albumin: 3.8 g/dL (ref 3.5–5.0)
Alkaline Phosphatase: 74 U/L (ref 38–126)
Anion gap: 11 (ref 5–15)
BUN: 21 mg/dL (ref 8–23)
CO2: 24 mmol/L (ref 22–32)
Calcium: 9.5 mg/dL (ref 8.9–10.3)
Chloride: 101 mmol/L (ref 98–111)
Creatinine, Ser: 1.01 mg/dL — ABNORMAL HIGH (ref 0.44–1.00)
GFR calc Af Amer: >60 mL/min (ref >60)
GFR calc non Af Amer: 53 mL/min — ABNORMAL LOW (ref >60)
Glucose, Bld: 263 mg/dL — ABNORMAL HIGH (ref 70–99)
Potassium: 3.3 mmol/L — ABNORMAL LOW (ref 3.5–5.1)
Sodium: 136 mmol/L (ref 135–145)
Total Bilirubin: 0.8 mg/dL (ref 0.3–1.2)
Total Protein: 7.1 g/dL (ref 6.5–8.1)

## 2019-07-18 MED ORDER — DEXAMETHASONE 4 MG PO TABS
40.0000 mg | ORAL_TABLET | Freq: Once | ORAL | Status: AC
  Administered 2019-07-18: 40 mg via ORAL
  Filled 2019-07-18: qty 10

## 2019-07-18 MED ORDER — PROCHLORPERAZINE MALEATE 10 MG PO TABS
10.0000 mg | ORAL_TABLET | Freq: Once | ORAL | Status: AC
  Administered 2019-07-18: 10 mg via ORAL
  Filled 2019-07-18: qty 1

## 2019-07-18 MED ORDER — BORTEZOMIB CHEMO SQ INJECTION 3.5 MG (2.5MG/ML)
1.3000 mg/m2 | Freq: Once | INTRAMUSCULAR | Status: AC
  Administered 2019-07-18: 2.25 mg via SUBCUTANEOUS
  Filled 2019-07-18: qty 0.9

## 2019-07-18 MED ORDER — DENOSUMAB 120 MG/1.7ML ~~LOC~~ SOLN
120.0000 mg | Freq: Once | SUBCUTANEOUS | Status: AC
  Administered 2019-07-18: 120 mg via SUBCUTANEOUS
  Filled 2019-07-18: qty 1.7

## 2019-07-18 MED ORDER — POTASSIUM CHLORIDE CRYS ER 20 MEQ PO TBCR
40.0000 meq | EXTENDED_RELEASE_TABLET | Freq: Once | ORAL | Status: AC
  Administered 2019-07-18: 40 meq via ORAL
  Filled 2019-07-18: qty 2

## 2019-07-18 NOTE — Patient Instructions (Signed)
[facility] Discharge Instructions for Patients Receiving Chemotherapy   Beginning January 23rd 2017 lab work for the Cancer Center will be done in the  Main lab at  on 1st floor. If you have a lab appointment with the Cancer Center please come in thru the  Main Entrance and check in at the main information desk   Today you received the following chemotherapy agents Velcade  To help prevent nausea and vomiting after your treatment, we encourage you to take your nausea medication    If you develop nausea and vomiting, or diarrhea that is not controlled by your medication, call the clinic.  The clinic phone number is [phone]. Office hours are Monday-Friday 8:30am-5:00pm.  BELOW ARE SYMPTOMS THAT SHOULD BE REPORTED IMMEDIATELY:  *FEVER GREATER THAN 101.0 F  *CHILLS WITH OR WITHOUT FEVER  NAUSEA AND VOMITING THAT IS NOT CONTROLLED WITH YOUR NAUSEA MEDICATION  *UNUSUAL SHORTNESS OF BREATH  *UNUSUAL BRUISING OR BLEEDING  TENDERNESS IN MOUTH AND THROAT WITH OR WITHOUT PRESENCE OF ULCERS  *URINARY PROBLEMS  *BOWEL PROBLEMS  UNUSUAL RASH Items with * indicate a potential emergency and should be followed up as soon as possible. If you have an emergency after office hours please contact your primary care physician or go to the nearest emergency department.  Please call the clinic during office hours if you have any questions or concerns.   You may also contact the Patient Navigator at [phone] should you have any questions or need assistance in obtaining follow up care.      Resources For Cancer Patients and their Caregivers ? American Cancer Society: Can assist with transportation, wigs, general needs, runs Look Good Feel Better.        1-888-227-6333 ? Cancer Care: Provides financial assistance, online support groups, medication/co-pay assistance.  1-800-813-HOPE (4673) ? [facility] Assists [place] cancer  patients and their families through emotional , educational and financial support.  336-427-4357 ? [place] DSS Where to apply for food stamps, Medicaid and utility assistance. 336-342-1394 ? [facility]: Transportation to medical appointments. 336-347-2287 ? Social Security Administration: May apply for disability if have a Stage IV cancer. 336-342-7796 1-800-772-1213 ? [place] Aging, Disability and Transit Services: Assists with nutrition, care and transit needs. 336-349-2343          

## 2019-07-18 NOTE — Progress Notes (Signed)
Brittany Archer presents today for Velcade injection. Lab work reviewed. K 3.3. 103mEq PO given x1. Retacrit held for hgb >11. Delton See and Velcade given without incident or complaint. Discharged self ambulatory in satisfactory condition.

## 2019-07-25 ENCOUNTER — Inpatient Hospital Stay (HOSPITAL_BASED_OUTPATIENT_CLINIC_OR_DEPARTMENT_OTHER): Payer: Medicare Other | Admitting: Hematology

## 2019-07-25 ENCOUNTER — Inpatient Hospital Stay (HOSPITAL_COMMUNITY): Payer: Medicare Other

## 2019-07-25 ENCOUNTER — Encounter (HOSPITAL_COMMUNITY): Payer: Self-pay | Admitting: Hematology

## 2019-07-25 ENCOUNTER — Other Ambulatory Visit: Payer: Self-pay

## 2019-07-25 DIAGNOSIS — E876 Hypokalemia: Secondary | ICD-10-CM | POA: Diagnosis not present

## 2019-07-25 DIAGNOSIS — R200 Anesthesia of skin: Secondary | ICD-10-CM | POA: Diagnosis not present

## 2019-07-25 DIAGNOSIS — C9000 Multiple myeloma not having achieved remission: Secondary | ICD-10-CM

## 2019-07-25 DIAGNOSIS — Z5112 Encounter for antineoplastic immunotherapy: Secondary | ICD-10-CM | POA: Diagnosis not present

## 2019-07-25 LAB — COMPREHENSIVE METABOLIC PANEL
ALT: 9 U/L (ref 0–44)
AST: 13 U/L — ABNORMAL LOW (ref 15–41)
Albumin: 3.7 g/dL (ref 3.5–5.0)
Alkaline Phosphatase: 75 U/L (ref 38–126)
Anion gap: 10 (ref 5–15)
BUN: 21 mg/dL (ref 8–23)
CO2: 25 mmol/L (ref 22–32)
Calcium: 9.7 mg/dL (ref 8.9–10.3)
Chloride: 102 mmol/L (ref 98–111)
Creatinine, Ser: 1.22 mg/dL — ABNORMAL HIGH (ref 0.44–1.00)
GFR calc Af Amer: 49 mL/min — ABNORMAL LOW (ref >60)
GFR calc non Af Amer: 42 mL/min — ABNORMAL LOW (ref >60)
Glucose, Bld: 292 mg/dL — ABNORMAL HIGH (ref 70–99)
Potassium: 3.6 mmol/L (ref 3.5–5.1)
Sodium: 137 mmol/L (ref 135–145)
Total Bilirubin: 0.9 mg/dL (ref 0.3–1.2)
Total Protein: 7.2 g/dL (ref 6.5–8.1)

## 2019-07-25 LAB — CBC WITH DIFFERENTIAL/PLATELET
Abs Immature Granulocytes: 0.02 10*3/uL (ref 0.00–0.07)
Basophils Absolute: 0 10*3/uL (ref 0.0–0.1)
Basophils Relative: 0 %
Eosinophils Absolute: 0.1 10*3/uL (ref 0.0–0.5)
Eosinophils Relative: 3 %
HCT: 36 % (ref 36.0–46.0)
Hemoglobin: 11.6 g/dL — ABNORMAL LOW (ref 12.0–15.0)
Immature Granulocytes: 0 %
Lymphocytes Relative: 21 %
Lymphs Abs: 1.1 10*3/uL (ref 0.7–4.0)
MCH: 33.6 pg (ref 26.0–34.0)
MCHC: 32.2 g/dL (ref 30.0–36.0)
MCV: 104.3 fL — ABNORMAL HIGH (ref 80.0–100.0)
Monocytes Absolute: 0.5 10*3/uL (ref 0.1–1.0)
Monocytes Relative: 10 %
Neutro Abs: 3.5 10*3/uL (ref 1.7–7.7)
Neutrophils Relative %: 66 %
Platelets: 171 10*3/uL (ref 150–400)
RBC: 3.45 MIL/uL — ABNORMAL LOW (ref 3.87–5.11)
RDW: 14.8 % (ref 11.5–15.5)
WBC: 5.3 10*3/uL (ref 4.0–10.5)
nRBC: 0 % (ref 0.0–0.2)

## 2019-07-25 MED ORDER — DEXAMETHASONE 4 MG PO TABS
40.0000 mg | ORAL_TABLET | Freq: Once | ORAL | Status: AC
  Administered 2019-07-25: 40 mg via ORAL

## 2019-07-25 MED ORDER — PROCHLORPERAZINE MALEATE 10 MG PO TABS
10.0000 mg | ORAL_TABLET | Freq: Once | ORAL | Status: AC
  Administered 2019-07-25: 10 mg via ORAL
  Filled 2019-07-25: qty 1

## 2019-07-25 MED ORDER — BORTEZOMIB CHEMO SQ INJECTION 3.5 MG (2.5MG/ML)
1.3000 mg/m2 | Freq: Once | INTRAMUSCULAR | Status: AC
  Administered 2019-07-25: 2.25 mg via SUBCUTANEOUS
  Filled 2019-07-25: qty 0.9

## 2019-07-25 NOTE — Progress Notes (Signed)
[place], [place] [address]   CLINIC:  Medical Oncology/Hematology  PCP:  Vesta Mixer [address] [place] [address] [phone]   REASON FOR VISIT:  Follow-up for IgA Kappa Multiple Myeloma   CURRENT THERAPY: RVD  BRIEF ONCOLOGIC HISTORY:  Oncology History  Multiple myeloma not having achieved remission ([facility])  01/20/2018 Initial Diagnosis   Multiple myeloma not having achieved remission ([facility])   01/26/2018 -  Chemotherapy   The patient had bortezomib SQ (VELCADE) chemo injection 2.5 mg, 1.3 mg/m2 = 2.5 mg, Subcutaneous,  Once, 20 of 23 cycles Administration: 2.5 mg (01/26/2018), 2.5 mg (02/02/2018), 2.5 mg (02/09/2018), 2.5 mg (02/16/2018), 2.5 mg (02/23/2018), 2.5 mg (03/02/2018), 2.5 mg (03/09/2018), 2.5 mg (03/30/2018), 2.5 mg (04/06/2018), 2.5 mg (04/13/2018), 2.5 mg (04/21/2018), 2.5 mg (05/06/2018), 2.5 mg (05/11/2018), 2.5 mg (05/20/2018), 2.5 mg (05/27/2018), 2.5 mg (06/10/2018), 2.5 mg (06/17/2018), 2.5 mg (06/24/2018), 2.5 mg (07/08/2018), 2.5 mg (07/15/2018), 2.5 mg (07/22/2018), 2.5 mg (08/05/2018), 2.5 mg (08/12/2018), 2.5 mg (09/02/2018), 2.5 mg (09/09/2018), 2.5 mg (09/16/2018), 2.5 mg (09/30/2018), 2.5 mg (10/07/2018), 2.5 mg (10/14/2018), 2.5 mg (10/31/2018), 2.5 mg (11/07/2018), 2.5 mg (11/14/2018), 2.5 mg (11/28/2018), 2.25 mg (12/05/2018), 2.25 mg (12/12/2018), 2.25 mg (12/26/2018), 2.25 mg (01/02/2019), 2.25 mg (01/09/2019), 2.25 mg (01/23/2019), 2.25 mg (01/30/2019), 2.25 mg (02/06/2019), 2.25 mg (02/20/2019), 2.25 mg (02/27/2019), 2.25 mg (03/06/2019), 2.25 mg (03/20/2019), 2.25 mg (03/27/2019), 2.25 mg (04/03/2019), 2.25 mg (04/17/2019), 2.25 mg (04/24/2019), 2.25 mg (05/01/2019), 2.25 mg (05/15/2019), 2.25 mg (05/23/2019), 2.25 mg (05/30/2019), 2.25 mg (06/13/2019), 2.25 mg (06/20/2019), 2.25 mg (06/27/2019), 2.25 mg (07/11/2019), 2.25 mg (07/18/2019)  for chemotherapy treatment.         INTERVAL HISTORY:  Brittany Archer 78 y.o. female seen for follow-up of  multiple myeloma.  She is tolerating Revlimid, Velcade and dexamethasone very well.  Denies any continuous tingling or numbness in extremities.  She has right foot numbness on and off which is stable.  Denies new onset bone pains.  Denies any fevers, night sweats or weight loss in the last 2 to 3 months.  She is continuing acyclovir twice daily.  Appetite is 100%.  Energy levels are 100%.  Pain is reported as 0.  No ER visits or hospitalizations.    REVIEW OF SYSTEMS:  Review of Systems  All other systems reviewed and are negative.    PAST MEDICAL/SURGICAL HISTORY:  Past Medical History:  Diagnosis Date  . Breast cancer ([facility])    left breast/ 2008/ surg/ rad tx  . Coronary artery disease   . Diabetes mellitus    Past Surgical History:  Procedure Laterality Date  . ABDOMINAL HYSTERECTOMY    . BREAST SURGERY       SOCIAL HISTORY:  Social History   Socioeconomic History  . Marital status: Divorced    Spouse name: Not on file  . Number of children: Not on file  . Years of education: Not on file  . Highest education level: Not on file  Occupational History  . Not on file  Social Needs  . Financial resource strain: Not on file  . Food insecurity    Worry: Not on file    Inability: Not on file  . Transportation needs    Medical: Not on file    Non-medical: Not on file  Tobacco Use  . Smoking status: Never Smoker  . Smokeless tobacco: Never Used  Substance and Sexual Activity  . Alcohol use: No  .  Drug use: No  . Sexual activity: Yes    Birth control/protection: Surgical  Lifestyle  . Physical activity    Days per week: Not on file    Minutes per session: Not on file  . Stress: Not on file  Relationships  . Social Herbalist on phone: Not on file    Gets together: Not on file    Attends religious service: Not on file    Active member of club or organization: Not on file    Attends meetings of clubs or organizations: Not on file    Relationship status:  Not on file  . Intimate partner violence    Fear of current or ex partner: Not on file    Emotionally abused: Not on file    Physically abused: Not on file    Forced sexual activity: Not on file  Other Topics Concern  . Not on file  Social History Narrative  . Not on file    FAMILY HISTORY:  Family History  Problem Relation Age of Onset  . Obesity Sister     CURRENT MEDICATIONS:  Outpatient Encounter Medications as of 07/25/2019  Medication Sig  . acetaminophen (TYLENOL) 500 MG tablet Take 500 mg by mouth every 6 (six) hours as needed for mild pain or moderate pain.  Marland Kitchen acyclovir (ZOVIRAX) 400 MG tablet Take 1 tablet (400 mg total) by mouth 2 (two) times daily.  Marland Kitchen aspirin 81 MG tablet Take 81 mg by mouth daily.    . bortezomib IV (VELCADE) 3.5 MG injection Inject into the vein once. weekly  . cholecalciferol (VITAMIN D) 1000 units tablet Take 1,000 Units by mouth daily.  . Denosumab (XGEVA [place]) Inject into the skin. Every 28 days  . dexamethasone (DECADRON) 4 MG tablet Take 10 tablets (40 mg) on days 1, 8, and 15 of chemo. Repeat every 21 days.  Marland Kitchen glipiZIDE (GLUCOTROL) 5 MG tablet Take 5 mg by mouth daily before breakfast.   . lenalidomide (REVLIMID) 20 MG capsule Take 1 capsule by mouth once daily for 14 days on, and 7 days off of a 21 day cycle.  Marland Kitchen lisinopril-hydrochlorothiazide (PRINZIDE,ZESTORETIC) 20-25 MG tablet Take 1 tablet by mouth daily.   . metFORMIN (GLUCOPHAGE) 1000 MG tablet Take 1,000 mg by mouth 2 times daily at 12 noon and 4 pm.    . ondansetron (ZOFRAN) 8 MG tablet Take 1 tablet (8 mg total) by mouth 2 (two) times daily as needed (Nausea or vomiting).  . potassium chloride SA (K-DUR) 20 MEQ tablet TAKE (1) TABLET BY MOUTH THREE TIMES DAILY  . prochlorperazine (COMPAZINE) 10 MG tablet Take 1 tablet (10 mg total) by mouth every 6 (six) hours as needed (Nausea or vomiting).   No facility-administered encounter medications on file as of 07/25/2019.     ALLERGIES:   Allergies  Allergen Reactions  . Motrin [Ibuprofen] Rash     PHYSICAL EXAM:  ECOG Performance status: 1  Vitals:   07/25/19 1100  BP: (!) 145/60  Pulse: 68  Resp: 16  Temp: 97.8 F (36.6 C)  SpO2: 98%   Filed Weights   07/25/19 1100  Weight: 164 lb 6 oz (74.6 kg)    Physical Exam Vitals signs reviewed.  Constitutional:      Appearance: Normal appearance. She is obese.  HENT:     Head: Normocephalic.     Nose: Nose normal.     Mouth/Throat:     Mouth: Mucous membranes are moist.  Pharynx: Oropharynx is clear.  Eyes:     Extraocular Movements: Extraocular movements intact.     Conjunctiva/sclera: Conjunctivae normal.  Neck:     Musculoskeletal: Normal range of motion.  Cardiovascular:     Rate and Rhythm: Normal rate and regular rhythm.     Pulses: Normal pulses.     Heart sounds: Normal heart sounds.  Pulmonary:     Effort: Pulmonary effort is normal.     Breath sounds: Normal breath sounds.  Abdominal:     General: Bowel sounds are normal.     Palpations: Abdomen is soft.  Musculoskeletal: Normal range of motion.  Skin:    General: Skin is warm and dry.  Neurological:     General: No focal deficit present.     Mental Status: She is alert and oriented to person, place, and time. Mental status is at baseline.  Psychiatric:        Mood and Affect: Mood normal.        Behavior: Behavior normal.        Thought Content: Thought content normal.        Judgment: Judgment normal.      LABORATORY DATA:  I have reviewed the labs as listed.  CBC    Component Value Date/Time   WBC 5.3 07/25/2019 1031   RBC 3.45 (L) 07/25/2019 1031   HGB 11.6 (L) 07/25/2019 1031   HCT 36.0 07/25/2019 1031   PLT 171 07/25/2019 1031   MCV 104.3 (H) 07/25/2019 1031   MCH 33.6 07/25/2019 1031   MCHC 32.2 07/25/2019 1031   RDW 14.8 07/25/2019 1031   LYMPHSABS 1.1 07/25/2019 1031   MONOABS 0.5 07/25/2019 1031   EOSABS 0.1 07/25/2019 1031   BASOSABS 0.0 07/25/2019 1031    CMP Latest Ref Rng & Units 07/25/2019 07/18/2019 07/11/2019  Glucose 70 - 99 mg/dL 292(H) 263(H) 131(H)  BUN 8 - 23 mg/dL '21 21 18  ' Creatinine 0.44 - 1.00 mg/dL 1.22(H) 1.01(H) 1.03(H)  Sodium 135 - 145 mmol/L 137 136 136  Potassium 3.5 - 5.1 mmol/L 3.6 3.3(L) 3.1(L)  Chloride 98 - 111 mmol/L 102 101 102  CO2 22 - 32 mmol/L '25 24 26  ' Calcium 8.9 - 10.3 mg/dL 9.7 9.5 8.9  Total Protein 6.5 - 8.1 g/dL 7.2 7.1 7.2  Total Bilirubin 0.3 - 1.2 mg/dL 0.9 0.8 0.6  Alkaline Phos 38 - 126 U/L 75 74 66  AST 15 - 41 U/L 13(L) 11(L) 12(L)  ALT 0 - 44 U/L '9 8 10         ' ASSESSMENT & PLAN:   Multiple myeloma not having achieved remission ([facility]) 1.  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: -Bone marrow biopsy on 01/03/2018 with 60% plasma cells, FISH panel with no abnormalities, chromosome analysis showing hyperdiploidy with gains of chromosomes 2, 3, 4, 7, 10, 15, beta-2 microglobulin 3.2, LDH normal, 0.9 g/dL of M spike at diagnosis, free light chain ratio of 114, Kappa Light chain of 1796 -Skeletal survey on 02/16/2018 showing subtle patchy areas of osteopenia of the thoracolumbar spine and possibly distal right clavicle which may reflect subtle changes of multiple myeloma - RVD (Velcade on days 1, 8 and 15, Revlimid 25 on days 1-21, dexamethasone 10 pills on day of Velcade every 28 days) cycle 1 started on 01/26/2018, cycle 4 on 04/13/2018, she is asynchronous with Revlimid and Velcade.  We have held her Revlimid once when [place] dropped to 600 during cycle 4. -  Revlimid was dose reduced to 20 mg 2 weeks on/1 week off because of cytopenias.  She takes dexamethasone 40 mg weekly. - We reviewed results of the myeloma panel dated 06/27/2019 which shows M spike is 0 and immunofixation was normal.  Free light chain ratio was slightly elevated at 1.85.  Kappa light chains are 35 and lambda light chains are 18.9.  Mild elevation in free light chains could be also due to CKD.  Creatinine today  is 1.5. - Myeloma panel from today is pending.  She may proceed with Revlimid and Velcade.  She will continue dexamethasone weekly. -I will see her back in 4 weeks for follow-up.  Once her free light chain ratio remains normal, we will consider discontinuing Velcade and putting her on maintenance Revlimid.  2.  Bone strengthening: -She will continue monthly denosumab.  She is taking vitamin D daily.  She was told to take calcium supplements.  3.  Hypokalemia: -Potassium is normal today.  She will continue potassium supplements.    Total time spent is 25 minutes with more than 50% of the time spent face-to-face discussing lab results, treatment plan, counseling and coordination of care.  Orders placed this encounter:  No orders of the defined types were placed in this encounter.     Derek Jack, MD  [place] [phone]

## 2019-07-25 NOTE — Patient Instructions (Addendum)
[place] at [facility] Discharge Instructions  You were seen today by Dr. Delton Coombes. He went over your recent lab results. He will see you back in 4 weeks for labs and follow up. Continue your injections as scheduled.    Thank you for choosing [place] at [facility] to provide your oncology and hematology care.  To afford each patient quality time with our provider, please arrive at least 15 minutes before your scheduled appointment time.   If you have a lab appointment with the [facility] please come in thru the  Main Entrance and check in at the main information desk  You need to re-schedule your appointment should you arrive 10 or more minutes late.  We strive to give you quality time with our providers, and arriving late affects you and other patients whose appointments are after yours.  Also, if you no show three or more times for appointments you may be dismissed from the clinic at the providers discretion.     Again, thank you for choosing [facility].  Our hope is that these requests will decrease the amount of time that you wait before being seen by our physicians.       _____________________________________________________________  Should you have questions after your visit to [facility], please contact our office at [phone] between the hours of 8:00 a.m. and 4:30 p.m.  Voicemails left after 4:00 p.m. will not be returned until the following business day.  For prescription refill requests, have your pharmacy contact our office and allow 72 hours.    Cancer Center Support Programs:   > Cancer Support Group  2nd Tuesday of the month 1pm-2pm, Journey Room

## 2019-07-25 NOTE — Patient Instructions (Signed)
[facility] Discharge Instructions for Patients Receiving Chemotherapy   Beginning January 23rd 2017 lab work for the Cancer Center will be done in the  Main lab at [place] on 1st floor. If you have a lab appointment with the Cancer Center please come in thru the  Main Entrance and check in at the main information desk   Today you received the following chemotherapy agents Velcade injection. Follow-up as scheduled. Call clinic for any questions or concerns  To help prevent nausea and vomiting after your treatment, we encourage you to take your nausea medication   If you develop nausea and vomiting, or diarrhea that is not controlled by your medication, call the clinic.  The clinic phone number is [phone]. Office hours are Monday-Friday 8:30am-5:00pm.  BELOW ARE SYMPTOMS THAT SHOULD BE REPORTED IMMEDIATELY:  *FEVER GREATER THAN 101.0 F  *CHILLS WITH OR WITHOUT FEVER  NAUSEA AND VOMITING THAT IS NOT CONTROLLED WITH YOUR NAUSEA MEDICATION  *UNUSUAL SHORTNESS OF BREATH  *UNUSUAL BRUISING OR BLEEDING  TENDERNESS IN MOUTH AND THROAT WITH OR WITHOUT PRESENCE OF ULCERS  *URINARY PROBLEMS  *BOWEL PROBLEMS  UNUSUAL RASH Items with * indicate a potential emergency and should be followed up as soon as possible. If you have an emergency after office hours please contact your primary care physician or go to the nearest emergency department.  Please call the clinic during office hours if you have any questions or concerns.   You may also contact the Patient Navigator at [phone] should you have any questions or need assistance in obtaining follow up care.      Resources For Cancer Patients and their Caregivers ? American Cancer Society: Can assist with transportation, wigs, general needs, runs Look Good Feel Better.        1-888-227-6333 ? Cancer Care: Provides financial assistance, online support groups, medication/co-pay assistance.   1-800-813-HOPE (4673) ? [facility] Assists [place] cancer patients and their families through emotional , educational and financial support.  336-427-4357 ? [place] DSS Where to apply for food stamps, Medicaid and utility assistance. 336-342-1394 ? [facility]: Transportation to medical appointments. 336-347-2287 ? Social Security Administration: May apply for disability if have a Stage IV cancer. 336-342-7796 1-800-772-1213 ? [place] Aging, Disability and Transit Services: Assists with nutrition, care and transit needs. 336-349-2343         

## 2019-07-25 NOTE — Progress Notes (Signed)
[place] reviewed with and pt seen by Dr. Delton Coombes and pt approved for Velcade injection today per MD                Brittany Archer tolerated Velcade injection well without complaints or incident. Pt discharged self ambulatory in satisfactory condition

## 2019-07-25 NOTE — Assessment & Plan Note (Signed)
1.  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: -Bone marrow biopsy on 01/03/2018 with 60% plasma cells, FISH panel with no abnormalities, chromosome analysis showing hyperdiploidy with gains of chromosomes 2, 3, 4, 7, 10, 15, beta-2 microglobulin 3.2, LDH normal, 0.9 g/dL of M spike at diagnosis, free light chain ratio of 114, Kappa Light chain of 1796 -Skeletal survey on 02/16/2018 showing subtle patchy areas of osteopenia of the thoracolumbar spine and possibly distal right clavicle which may reflect subtle changes of multiple myeloma - RVD (Velcade on days 1, 8 and 15, Revlimid 25 on days 1-21, dexamethasone 10 pills on day of Velcade every 28 days) cycle 1 started on 01/26/2018, cycle 4 on 04/13/2018, she is asynchronous with Revlimid and Velcade.  We have held her Revlimid once when [place] dropped to 600 during cycle 4. - Revlimid was dose reduced to 20 mg 2 weeks on/1 week off because of cytopenias.  She takes dexamethasone 40 mg weekly. - We reviewed results of the myeloma panel dated 06/27/2019 which shows M spike is 0 and immunofixation was normal.  Free light chain ratio was slightly elevated at 1.85.  Kappa light chains are 35 and lambda light chains are 18.9.  Mild elevation in free light chains could be also due to CKD.  Creatinine today is 1.5. - Myeloma panel from today is pending.  She may proceed with Revlimid and Velcade.  She will continue dexamethasone weekly. -I will see her back in 4 weeks for follow-up.  Once her free light chain ratio remains normal, we will consider discontinuing Velcade and putting her on maintenance Revlimid.  2.  Bone strengthening: -She will continue monthly denosumab.  She is taking vitamin D daily.  She was told to take calcium supplements.  3.  Hypokalemia: -Potassium is normal today.  She will continue potassium supplements.

## 2019-07-26 LAB — PROTEIN ELECTROPHORESIS, SERUM
A/G Ratio: 1.3 (ref 0.7–1.7)
Albumin ELP: 3.6 g/dL (ref 2.9–4.4)
Alpha-1-Globulin: 0.1 g/dL (ref 0.0–0.4)
Alpha-2-Globulin: 0.9 g/dL (ref 0.4–1.0)
Beta Globulin: 0.9 g/dL (ref 0.7–1.3)
Gamma Globulin: 0.8 g/dL (ref 0.4–1.8)
Globulin, Total: 2.8 g/dL (ref 2.2–3.9)
M-Spike, %: 0.3 g/dL — ABNORMAL HIGH
Total Protein ELP: 6.4 g/dL (ref 6.0–8.5)

## 2019-07-26 LAB — KAPPA/LAMBDA LIGHT CHAINS
Kappa free light chain: 37.3 mg/L — ABNORMAL HIGH (ref 3.3–19.4)
Kappa, lambda light chain ratio: 1.92 — ABNORMAL HIGH (ref 0.26–1.65)
Lambda free light chains: 19.4 mg/L (ref 5.7–26.3)

## 2019-07-27 LAB — IMMUNOFIXATION ELECTROPHORESIS
IgA: 427 mg/dL — ABNORMAL HIGH (ref 64–422)
IgG (Immunoglobin G), Serum: 1054 mg/dL (ref 586–1602)
IgM (Immunoglobulin M), Srm: 97 mg/dL (ref 26–217)
Total Protein ELP: 6.4 g/dL (ref 6.0–8.5)

## 2019-08-02 ENCOUNTER — Other Ambulatory Visit (HOSPITAL_COMMUNITY): Payer: Self-pay | Admitting: *Deleted

## 2019-08-02 DIAGNOSIS — C9000 Multiple myeloma not having achieved remission: Secondary | ICD-10-CM

## 2019-08-02 MED ORDER — LENALIDOMIDE 20 MG PO CAPS: ORAL_CAPSULE | ORAL | 0 refills | Status: DC

## 2019-08-08 ENCOUNTER — Other Ambulatory Visit: Payer: Self-pay

## 2019-08-08 ENCOUNTER — Inpatient Hospital Stay (HOSPITAL_COMMUNITY): Payer: Medicare Other | Attending: Hematology

## 2019-08-08 ENCOUNTER — Inpatient Hospital Stay (HOSPITAL_COMMUNITY): Payer: Medicare Other

## 2019-08-08 VITALS — BP 145/67 | HR 63 | Temp 97.3°F | Resp 18

## 2019-08-08 DIAGNOSIS — D649 Anemia, unspecified: Secondary | ICD-10-CM

## 2019-08-08 DIAGNOSIS — E876 Hypokalemia: Secondary | ICD-10-CM

## 2019-08-08 DIAGNOSIS — E538 Deficiency of other specified B group vitamins: Secondary | ICD-10-CM | POA: Diagnosis not present

## 2019-08-08 DIAGNOSIS — Z5111 Encounter for antineoplastic chemotherapy: Secondary | ICD-10-CM

## 2019-08-08 DIAGNOSIS — C9000 Multiple myeloma not having achieved remission: Secondary | ICD-10-CM

## 2019-08-08 DIAGNOSIS — Z5112 Encounter for antineoplastic immunotherapy: Secondary | ICD-10-CM | POA: Insufficient documentation

## 2019-08-08 LAB — COMPREHENSIVE METABOLIC PANEL
ALT: 10 U/L (ref 0–44)
AST: 12 U/L — ABNORMAL LOW (ref 15–41)
Albumin: 3.6 g/dL (ref 3.5–5.0)
Alkaline Phosphatase: 61 U/L (ref 38–126)
Anion gap: 10 (ref 5–15)
BUN: 18 mg/dL (ref 8–23)
CO2: 26 mmol/L (ref 22–32)
Calcium: 9.7 mg/dL (ref 8.9–10.3)
Chloride: 103 mmol/L (ref 98–111)
Creatinine, Ser: 1.11 mg/dL — ABNORMAL HIGH (ref 0.44–1.00)
GFR calc Af Amer: 55 mL/min — ABNORMAL LOW (ref >60)
GFR calc non Af Amer: 47 mL/min — ABNORMAL LOW (ref >60)
Glucose, Bld: 154 mg/dL — ABNORMAL HIGH (ref 70–99)
Potassium: 3.7 mmol/L (ref 3.5–5.1)
Sodium: 139 mmol/L (ref 135–145)
Total Bilirubin: 0.5 mg/dL (ref 0.3–1.2)
Total Protein: 7.2 g/dL (ref 6.5–8.1)

## 2019-08-08 LAB — CBC WITH DIFFERENTIAL/PLATELET
Abs Immature Granulocytes: 0.01 10*3/uL (ref 0.00–0.07)
Basophils Absolute: 0 10*3/uL (ref 0.0–0.1)
Basophils Relative: 1 %
Eosinophils Absolute: 0.2 10*3/uL (ref 0.0–0.5)
Eosinophils Relative: 4 %
HCT: 33.9 % — ABNORMAL LOW (ref 36.0–46.0)
Hemoglobin: 11 g/dL — ABNORMAL LOW (ref 12.0–15.0)
Immature Granulocytes: 0 %
Lymphocytes Relative: 23 %
Lymphs Abs: 1 10*3/uL (ref 0.7–4.0)
MCH: 33.5 pg (ref 26.0–34.0)
MCHC: 32.4 g/dL (ref 30.0–36.0)
MCV: 103.4 fL — ABNORMAL HIGH (ref 80.0–100.0)
Monocytes Absolute: 0.4 10*3/uL (ref 0.1–1.0)
Monocytes Relative: 10 %
Neutro Abs: 2.7 10*3/uL (ref 1.7–7.7)
Neutrophils Relative %: 62 %
Platelets: 208 10*3/uL (ref 150–400)
RBC: 3.28 MIL/uL — ABNORMAL LOW (ref 3.87–5.11)
RDW: 14.6 % (ref 11.5–15.5)
WBC: 4.3 10*3/uL (ref 4.0–10.5)
nRBC: 0 % (ref 0.0–0.2)

## 2019-08-08 MED ORDER — DEXAMETHASONE 4 MG PO TABS
40.0000 mg | ORAL_TABLET | Freq: Once | ORAL | Status: AC
  Administered 2019-08-08: 40 mg via ORAL

## 2019-08-08 MED ORDER — PROCHLORPERAZINE MALEATE 10 MG PO TABS
10.0000 mg | ORAL_TABLET | Freq: Once | ORAL | Status: AC
  Administered 2019-08-08: 10 mg via ORAL
  Filled 2019-08-08: qty 1

## 2019-08-08 MED ORDER — BORTEZOMIB CHEMO SQ INJECTION 3.5 MG (2.5MG/ML)
1.3000 mg/m2 | Freq: Once | INTRAMUSCULAR | Status: AC
  Administered 2019-08-08: 2.25 mg via SUBCUTANEOUS
  Filled 2019-08-08: qty 0.9

## 2019-08-08 NOTE — Progress Notes (Signed)
Patient tolerated injection with no complaints voiced.  Site clean and dry with no bruising or swelling noted at site.  Band aid applied.  Vss with discharge and left ambulatory with no s/s of distress noted.  

## 2019-08-15 ENCOUNTER — Encounter (HOSPITAL_COMMUNITY): Payer: Self-pay

## 2019-08-15 ENCOUNTER — Other Ambulatory Visit: Payer: Self-pay

## 2019-08-15 ENCOUNTER — Inpatient Hospital Stay (HOSPITAL_COMMUNITY): Payer: Medicare Other

## 2019-08-15 VITALS — BP 126/66 | HR 62 | Temp 97.7°F | Resp 18 | Wt 162.0 lb

## 2019-08-15 DIAGNOSIS — Z5111 Encounter for antineoplastic chemotherapy: Secondary | ICD-10-CM

## 2019-08-15 DIAGNOSIS — Z5112 Encounter for antineoplastic immunotherapy: Secondary | ICD-10-CM | POA: Diagnosis not present

## 2019-08-15 DIAGNOSIS — E538 Deficiency of other specified B group vitamins: Secondary | ICD-10-CM

## 2019-08-15 DIAGNOSIS — D649 Anemia, unspecified: Secondary | ICD-10-CM

## 2019-08-15 DIAGNOSIS — C9000 Multiple myeloma not having achieved remission: Secondary | ICD-10-CM

## 2019-08-15 DIAGNOSIS — E876 Hypokalemia: Secondary | ICD-10-CM

## 2019-08-15 LAB — CBC WITH DIFFERENTIAL/PLATELET
Abs Immature Granulocytes: 0.03 10*3/uL (ref 0.00–0.07)
Basophils Absolute: 0 10*3/uL (ref 0.0–0.1)
Basophils Relative: 0 %
Eosinophils Absolute: 0.2 10*3/uL (ref 0.0–0.5)
Eosinophils Relative: 4 %
HCT: 34.4 % — ABNORMAL LOW (ref 36.0–46.0)
Hemoglobin: 11.2 g/dL — ABNORMAL LOW (ref 12.0–15.0)
Immature Granulocytes: 1 %
Lymphocytes Relative: 16 %
Lymphs Abs: 0.8 10*3/uL (ref 0.7–4.0)
MCH: 33.1 pg (ref 26.0–34.0)
MCHC: 32.6 g/dL (ref 30.0–36.0)
MCV: 101.8 fL — ABNORMAL HIGH (ref 80.0–100.0)
Monocytes Absolute: 0.5 10*3/uL (ref 0.1–1.0)
Monocytes Relative: 9 %
Neutro Abs: 3.7 10*3/uL (ref 1.7–7.7)
Neutrophils Relative %: 70 %
Platelets: 160 10*3/uL (ref 150–400)
RBC: 3.38 MIL/uL — ABNORMAL LOW (ref 3.87–5.11)
RDW: 14.5 % (ref 11.5–15.5)
WBC: 5.2 10*3/uL (ref 4.0–10.5)
nRBC: 0 % (ref 0.0–0.2)

## 2019-08-15 LAB — COMPREHENSIVE METABOLIC PANEL
ALT: 10 U/L (ref 0–44)
AST: 11 U/L — ABNORMAL LOW (ref 15–41)
Albumin: 3.6 g/dL (ref 3.5–5.0)
Alkaline Phosphatase: 81 U/L (ref 38–126)
Anion gap: 9 (ref 5–15)
BUN: 25 mg/dL — ABNORMAL HIGH (ref 8–23)
CO2: 27 mmol/L (ref 22–32)
Calcium: 9 mg/dL (ref 8.9–10.3)
Chloride: 100 mmol/L (ref 98–111)
Creatinine, Ser: 1.14 mg/dL — ABNORMAL HIGH (ref 0.44–1.00)
GFR calc Af Amer: 53 mL/min — ABNORMAL LOW (ref >60)
GFR calc non Af Amer: 46 mL/min — ABNORMAL LOW (ref >60)
Glucose, Bld: 244 mg/dL — ABNORMAL HIGH (ref 70–99)
Potassium: 3.5 mmol/L (ref 3.5–5.1)
Sodium: 136 mmol/L (ref 135–145)
Total Bilirubin: 0.5 mg/dL (ref 0.3–1.2)
Total Protein: 7.1 g/dL (ref 6.5–8.1)

## 2019-08-15 MED ORDER — SODIUM CHLORIDE 0.9 % IV SOLN: Freq: Once | INTRAVENOUS | Status: DC | PRN

## 2019-08-15 MED ORDER — EPINEPHRINE HCL 0.1 MG/ML IJ SOLN: 0.2500 mg | Freq: Once | INTRAMUSCULAR | Status: DC | PRN

## 2019-08-15 MED ORDER — PROCHLORPERAZINE MALEATE 10 MG PO TABS
10.0000 mg | ORAL_TABLET | Freq: Once | ORAL | Status: AC
  Administered 2019-08-15: 10 mg via ORAL
  Filled 2019-08-15: qty 1

## 2019-08-15 MED ORDER — BORTEZOMIB CHEMO SQ INJECTION 3.5 MG (2.5MG/ML)
1.3000 mg/m2 | Freq: Once | INTRAMUSCULAR | Status: AC
  Administered 2019-08-15: 2.25 mg via SUBCUTANEOUS
  Filled 2019-08-15: qty 0.9

## 2019-08-15 MED ORDER — DIPHENHYDRAMINE HCL 50 MG/ML IJ SOLN: 25.0000 mg | Freq: Once | INTRAMUSCULAR | Status: DC | PRN

## 2019-08-15 MED ORDER — METHYLPREDNISOLONE SODIUM SUCC 125 MG IJ SOLR: 125.0000 mg | Freq: Once | INTRAMUSCULAR | Status: DC | PRN

## 2019-08-15 MED ORDER — EPOETIN ALFA 20000 UNIT/ML IJ SOLN: 20000.0000 [IU] | Freq: Once | INTRAMUSCULAR | Status: DC

## 2019-08-15 MED ORDER — ALBUTEROL SULFATE (2.5 MG/3ML) 0.083% IN NEBU: 2.5000 mg | INHALATION_SOLUTION | Freq: Once | RESPIRATORY_TRACT | Status: DC | PRN

## 2019-08-15 MED ORDER — DEXAMETHASONE 4 MG PO TABS
40.0000 mg | ORAL_TABLET | Freq: Once | ORAL | Status: AC
  Administered 2019-08-15: 40 mg via ORAL

## 2019-08-15 MED ORDER — DENOSUMAB 120 MG/1.7ML ~~LOC~~ SOLN
120.0000 mg | Freq: Once | SUBCUTANEOUS | Status: AC
  Administered 2019-08-15: 120 mg via SUBCUTANEOUS
  Filled 2019-08-15: qty 1.7

## 2019-08-15 MED ORDER — DIPHENHYDRAMINE HCL 50 MG/ML IJ SOLN: 50.0000 mg | Freq: Once | INTRAMUSCULAR | Status: DC | PRN

## 2019-08-15 MED ORDER — CYANOCOBALAMIN 1000 MCG/ML IJ SOLN
1000.0000 ug | Freq: Once | INTRAMUSCULAR | Status: AC
  Administered 2019-08-15: 11:00:00 1000 ug via INTRAMUSCULAR
  Filled 2019-08-15: qty 1

## 2019-08-15 NOTE — Progress Notes (Signed)
Patient to treatment room for injections.  Taking calcium as directed.  Denied tooth, jaw, and leg pain.  No recent or upcoming dental visits.  No s/s of distress noted.   HGB 11.2 today.  Epogen held today.   Patient tolerated injection with no complaints voiced.  Site clean and dry with no bruising or swelling noted at site.  Band aid applied.  Vss with discharge and left ambulatory with no s/s of distress noted.

## 2019-08-22 ENCOUNTER — Inpatient Hospital Stay (HOSPITAL_COMMUNITY): Payer: Medicare Other

## 2019-08-22 ENCOUNTER — Encounter (HOSPITAL_COMMUNITY): Payer: Self-pay | Admitting: Hematology

## 2019-08-22 ENCOUNTER — Other Ambulatory Visit: Payer: Self-pay

## 2019-08-22 ENCOUNTER — Inpatient Hospital Stay (HOSPITAL_BASED_OUTPATIENT_CLINIC_OR_DEPARTMENT_OTHER): Payer: Medicare Other | Admitting: Hematology

## 2019-08-22 VITALS — BP 132/53 | HR 80 | Temp 97.1°F | Resp 16 | Wt 160.6 lb

## 2019-08-22 DIAGNOSIS — C9000 Multiple myeloma not having achieved remission: Secondary | ICD-10-CM

## 2019-08-22 DIAGNOSIS — Z5112 Encounter for antineoplastic immunotherapy: Secondary | ICD-10-CM | POA: Diagnosis not present

## 2019-08-22 LAB — COMPREHENSIVE METABOLIC PANEL
ALT: 10 U/L (ref 0–44)
AST: 11 U/L — ABNORMAL LOW (ref 15–41)
Albumin: 3.7 g/dL (ref 3.5–5.0)
Alkaline Phosphatase: 77 U/L (ref 38–126)
Anion gap: 10 (ref 5–15)
BUN: 23 mg/dL (ref 8–23)
CO2: 26 mmol/L (ref 22–32)
Calcium: 9.6 mg/dL (ref 8.9–10.3)
Chloride: 98 mmol/L (ref 98–111)
Creatinine, Ser: 1.38 mg/dL — ABNORMAL HIGH (ref 0.44–1.00)
GFR calc Af Amer: 42 mL/min — ABNORMAL LOW (ref >60)
GFR calc non Af Amer: 36 mL/min — ABNORMAL LOW (ref >60)
Glucose, Bld: 248 mg/dL — ABNORMAL HIGH (ref 70–99)
Potassium: 3.5 mmol/L (ref 3.5–5.1)
Sodium: 134 mmol/L — ABNORMAL LOW (ref 135–145)
Total Bilirubin: 0.6 mg/dL (ref 0.3–1.2)
Total Protein: 7.4 g/dL (ref 6.5–8.1)

## 2019-08-22 LAB — CBC WITH DIFFERENTIAL/PLATELET
Abs Immature Granulocytes: 0.02 10*3/uL (ref 0.00–0.07)
Basophils Absolute: 0 10*3/uL (ref 0.0–0.1)
Basophils Relative: 0 %
Eosinophils Absolute: 0.2 10*3/uL (ref 0.0–0.5)
Eosinophils Relative: 3 %
HCT: 36.6 % (ref 36.0–46.0)
Hemoglobin: 11.8 g/dL — ABNORMAL LOW (ref 12.0–15.0)
Immature Granulocytes: 0 %
Lymphocytes Relative: 20 %
Lymphs Abs: 1 10*3/uL (ref 0.7–4.0)
MCH: 33.2 pg (ref 26.0–34.0)
MCHC: 32.2 g/dL (ref 30.0–36.0)
MCV: 103.1 fL — ABNORMAL HIGH (ref 80.0–100.0)
Monocytes Absolute: 0.5 10*3/uL (ref 0.1–1.0)
Monocytes Relative: 8 %
Neutro Abs: 3.7 10*3/uL (ref 1.7–7.7)
Neutrophils Relative %: 69 %
Platelets: 179 10*3/uL (ref 150–400)
RBC: 3.55 MIL/uL — ABNORMAL LOW (ref 3.87–5.11)
RDW: 14.9 % (ref 11.5–15.5)
WBC: 5.3 10*3/uL (ref 4.0–10.5)
nRBC: 0 % (ref 0.0–0.2)

## 2019-08-22 MED ORDER — DEXAMETHASONE 4 MG PO TABS
40.0000 mg | ORAL_TABLET | Freq: Once | ORAL | Status: AC
  Administered 2019-08-22: 40 mg via ORAL
  Filled 2019-08-22: qty 10

## 2019-08-22 MED ORDER — PROCHLORPERAZINE MALEATE 10 MG PO TABS
10.0000 mg | ORAL_TABLET | Freq: Once | ORAL | Status: AC
  Administered 2019-08-22: 10 mg via ORAL
  Filled 2019-08-22: qty 1

## 2019-08-22 MED ORDER — BORTEZOMIB CHEMO SQ INJECTION 3.5 MG (2.5MG/ML)
1.3000 mg/m2 | Freq: Once | INTRAMUSCULAR | Status: AC
  Administered 2019-08-22: 2.25 mg via SUBCUTANEOUS
  Filled 2019-08-22: qty 0.9

## 2019-08-22 NOTE — Assessment & Plan Note (Signed)
1.  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  Beta-2 MG of 3.2, LDH normal.  SPEP with 0.9 g/dL, free light chain ratio of 114, kappa light chains of 7096. - Skeletal survey on 02/16/2018 shows subtle patchy areas of osteopenia of the thoracolumbar spine and possibly distal right clavicle which may reflect subtle changes of multiple myeloma. - RVD started on 01/26/2018. - Revlimid dose reduced to 20 mg 2 weeks on 1 week off because of cytopenias.  She is continuing dexamethasone 40 mg weekly. -We reviewed myeloma panel from 07/17/2019.  M spike is 0.3 g.  Immunofixation was polyclonal.  Light chain ratio is 1.93.  Kappa light chains are 37. - As her M spike has become positive again, I have recommended continuing the same regimen at this time. -We will see her back in 4 weeks for follow-up.  She is continuing to tolerate her Revlimid and Velcade very well.  2.  Bone strengthening: - She will continue monthly denosumab.  She is taking calcium and vitamin D.  3.  Hypokalemia: - Potassium is 3.5.  Creatinine is 1.8.  She will continue potassium supplements.

## 2019-08-22 NOTE — Progress Notes (Signed)
[address] [place], [place] [address]   CLINIC:  Medical Oncology/Hematology  PCP:  Vesta Mixer [address] [place] [address] [phone]   REASON FOR VISIT:  Follow-up for IgA Kappa Multiple Myeloma   CURRENT THERAPY: RVD  BRIEF ONCOLOGIC HISTORY:  Oncology History  Multiple myeloma not having achieved remission ([place])  01/20/2018 Initial Diagnosis   Multiple myeloma not having achieved remission ([place])   01/26/2018 -  Chemotherapy   The patient had bortezomib SQ (VELCADE) chemo injection 2.5 mg, 1.3 mg/m2 = 2.5 mg, Subcutaneous,  Once, 21 of 23 cycles Administration: 2.5 mg (01/26/2018), 2.5 mg (02/02/2018), 2.5 mg (02/09/2018), 2.5 mg (02/16/2018), 2.5 mg (02/23/2018), 2.5 mg (03/02/2018), 2.5 mg (03/09/2018), 2.5 mg (03/30/2018), 2.5 mg (04/06/2018), 2.5 mg (04/13/2018), 2.5 mg (04/21/2018), 2.5 mg (05/06/2018), 2.5 mg (05/11/2018), 2.5 mg (05/20/2018), 2.5 mg (05/27/2018), 2.5 mg (06/10/2018), 2.5 mg (06/17/2018), 2.5 mg (06/24/2018), 2.5 mg (07/08/2018), 2.5 mg (07/15/2018), 2.5 mg (07/22/2018), 2.5 mg (08/05/2018), 2.5 mg (08/12/2018), 2.5 mg (09/02/2018), 2.5 mg (09/09/2018), 2.5 mg (09/16/2018), 2.5 mg (09/30/2018), 2.5 mg (10/07/2018), 2.5 mg (10/14/2018), 2.5 mg (10/31/2018), 2.5 mg (11/07/2018), 2.5 mg (11/14/2018), 2.5 mg (11/28/2018), 2.25 mg (12/05/2018), 2.25 mg (12/12/2018), 2.25 mg (12/26/2018), 2.25 mg (01/02/2019), 2.25 mg (01/09/2019), 2.25 mg (01/23/2019), 2.25 mg (01/30/2019), 2.25 mg (02/06/2019), 2.25 mg (02/20/2019), 2.25 mg (02/27/2019), 2.25 mg (03/06/2019), 2.25 mg (03/20/2019), 2.25 mg (03/27/2019), 2.25 mg (04/03/2019), 2.25 mg (04/17/2019), 2.25 mg (04/24/2019), 2.25 mg (05/01/2019), 2.25 mg (05/15/2019), 2.25 mg (05/23/2019), 2.25 mg (05/30/2019), 2.25 mg (06/13/2019), 2.25 mg (06/20/2019), 2.25 mg (06/27/2019), 2.25 mg (07/11/2019), 2.25 mg (07/18/2019), 2.25 mg (07/25/2019), 2.25 mg (08/08/2019), 2.25 mg (08/15/2019)  for chemotherapy treatment.         INTERVAL  HISTORY:  Ms. Callegari 78 y.o. female seen for follow-up of multiple myeloma.  She is tolerating Velcade and Revlimid very well.  She is taking Revlimid without fail.  She denies any tingling or numbness in the extremities.  Denies any GI side effects including nausea, vomiting, diarrhea or constipation.  Denies any fevers or infections.  No ER visits or hospitalizations.  No bleeding per rectum or melena.  Appetite is 100% energy levels are 50%.    REVIEW OF SYSTEMS:  Review of Systems  All other systems reviewed and are negative.    PAST MEDICAL/SURGICAL HISTORY:  Past Medical History:  Diagnosis Date  . Breast cancer ([facility])    left breast/ 2008/ surg/ rad tx  . Coronary artery disease   . Diabetes mellitus    Past Surgical History:  Procedure Laterality Date  . ABDOMINAL HYSTERECTOMY    . BREAST SURGERY       SOCIAL HISTORY:  Social History   Socioeconomic History  . Marital status: Divorced    Spouse name: Not on file  . Number of children: Not on file  . Years of education: Not on file  . Highest education level: Not on file  Occupational History  . Not on file  Social Needs  . Financial resource strain: Not on file  . Food insecurity    Worry: Not on file    Inability: Not on file  . Transportation needs    Medical: Not on file    Non-medical: Not on file  Tobacco Use  . Smoking status: Never Smoker  . Smokeless tobacco: Never Used  Substance and Sexual Activity  . Alcohol use: No  . Drug use: No  . Sexual  activity: Yes    Birth control/protection: Surgical  Lifestyle  . Physical activity    Days per week: Not on file    Minutes per session: Not on file  . Stress: Not on file  Relationships  . Social Herbalist on phone: Not on file    Gets together: Not on file    Attends religious service: Not on file    Active member of club or organization: Not on file    Attends meetings of clubs or organizations: Not on file    Relationship status:  Not on file  . Intimate partner violence    Fear of current or ex partner: Not on file    Emotionally abused: Not on file    Physically abused: Not on file    Forced sexual activity: Not on file  Other Topics Concern  . Not on file  Social History Narrative  . Not on file    FAMILY HISTORY:  Family History  Problem Relation Age of Onset  . Obesity Sister     CURRENT MEDICATIONS:  Outpatient Encounter Medications as of 08/22/2019  Medication Sig  . acetaminophen (TYLENOL) 500 MG tablet Take 500 mg by mouth every 6 (six) hours as needed for mild pain or moderate pain.  Marland Kitchen acyclovir (ZOVIRAX) 400 MG tablet Take 1 tablet (400 mg total) by mouth 2 (two) times daily.  Marland Kitchen aspirin 81 MG tablet Take 81 mg by mouth daily.    . bortezomib IV (VELCADE) 3.5 MG injection Inject into the vein once. weekly  . cholecalciferol (VITAMIN D) 1000 units tablet Take 1,000 Units by mouth daily.  . Denosumab (XGEVA [place]) Inject into the skin. Every 28 days  . dexamethasone (DECADRON) 4 MG tablet Take 10 tablets (40 mg) on days 1, 8, and 15 of chemo. Repeat every 21 days.  Marland Kitchen glipiZIDE (GLUCOTROL) 5 MG tablet Take 5 mg by mouth daily before breakfast.   . lenalidomide (REVLIMID) 20 MG capsule Take 1 capsule by mouth once daily for 14 days on, and 7 days off of a 21 day cycle.  Marland Kitchen lisinopril-hydrochlorothiazide (PRINZIDE,ZESTORETIC) 20-25 MG tablet Take 1 tablet by mouth daily.   . metFORMIN (GLUCOPHAGE) 1000 MG tablet Take 1,000 mg by mouth 2 times daily at 12 noon and 4 pm.    . potassium chloride SA (K-DUR) 20 MEQ tablet TAKE (1) TABLET BY MOUTH THREE TIMES DAILY  . prochlorperazine (COMPAZINE) 10 MG tablet Take 1 tablet (10 mg total) by mouth every 6 (six) hours as needed (Nausea or vomiting).  . ondansetron (ZOFRAN) 8 MG tablet Take 1 tablet (8 mg total) by mouth 2 (two) times daily as needed (Nausea or vomiting). (Patient not taking: Reported on 08/22/2019)   No facility-administered encounter medications on  file as of 08/22/2019.     ALLERGIES:  Allergies  Allergen Reactions  . Motrin [Ibuprofen] Rash     PHYSICAL EXAM:  ECOG Performance status: 1  Vitals:   08/22/19 1429  BP: (!) 132/53  Pulse: 80  Resp: 16  Temp: (!) 97.1 F (36.2 C)  SpO2: 99%   Filed Weights   08/22/19 1429  Weight: 160 lb 9 oz (72.8 kg)    Physical Exam Vitals signs reviewed.  Constitutional:      Appearance: Normal appearance. She is obese.  HENT:     Head: Normocephalic.     Nose: Nose normal.     Mouth/Throat:     Mouth: Mucous membranes are moist.  Pharynx: Oropharynx is clear.  Eyes:     Extraocular Movements: Extraocular movements intact.     Conjunctiva/sclera: Conjunctivae normal.  Neck:     Musculoskeletal: Normal range of motion.  Cardiovascular:     Rate and Rhythm: Normal rate and regular rhythm.     Pulses: Normal pulses.     Heart sounds: Normal heart sounds.  Pulmonary:     Effort: Pulmonary effort is normal.     Breath sounds: Normal breath sounds.  Abdominal:     General: Bowel sounds are normal.     Palpations: Abdomen is soft.  Musculoskeletal: Normal range of motion.  Skin:    General: Skin is warm and dry.  Neurological:     General: No focal deficit present.     Mental Status: She is alert and oriented to person, place, and time. Mental status is at baseline.  Psychiatric:        Mood and Affect: Mood normal.        Behavior: Behavior normal.        Thought Content: Thought content normal.        Judgment: Judgment normal.      LABORATORY DATA:  I have reviewed the labs as listed.  CBC    Component Value Date/Time   WBC 5.3 08/22/2019 1404   RBC 3.55 (L) 08/22/2019 1404   HGB 11.8 (L) 08/22/2019 1404   HCT 36.6 08/22/2019 1404   PLT 179 08/22/2019 1404   MCV 103.1 (H) 08/22/2019 1404   MCH 33.2 08/22/2019 1404   MCHC 32.2 08/22/2019 1404   RDW 14.9 08/22/2019 1404   LYMPHSABS 1.0 08/22/2019 1404   MONOABS 0.5 08/22/2019 1404   EOSABS 0.2  08/22/2019 1404   BASOSABS 0.0 08/22/2019 1404   CMP Latest Ref Rng & Units 08/22/2019 08/15/2019 08/08/2019  Glucose 70 - 99 mg/dL 248(H) 244(H) 154(H)  BUN 8 - 23 mg/dL 23 25(H) 18  Creatinine 0.44 - 1.00 mg/dL 1.38(H) 1.14(H) 1.11(H)  Sodium 135 - 145 mmol/L 134(L) 136 139  Potassium 3.5 - 5.1 mmol/L 3.5 3.5 3.7  Chloride 98 - 111 mmol/L 98 100 103  CO2 22 - 32 mmol/L '26 27 26  ' Calcium 8.9 - 10.3 mg/dL 9.6 9.0 9.7  Total Protein 6.5 - 8.1 g/dL 7.4 7.1 7.2  Total Bilirubin 0.3 - 1.2 mg/dL 0.6 0.5 0.5  Alkaline Phos 38 - 126 U/L 77 81 61  AST 15 - 41 U/L 11(L) 11(L) 12(L)  ALT 0 - 44 U/L '10 10 10      ' I have independently reviewed her scans and discussed with the patient.   ASSESSMENT & PLAN:   Multiple myeloma not having achieved remission ([place]) 1.  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  Beta-2 MG of 3.2, LDH normal.  SPEP with 0.9 g/dL, free light chain ratio of 114, kappa light chains of 7096. - Skeletal survey on 02/16/2018 shows subtle patchy areas of osteopenia of the thoracolumbar spine and possibly distal right clavicle which may reflect subtle changes of multiple myeloma. - RVD started on 01/26/2018. - Revlimid dose reduced to 20 mg 2 weeks on 1 week off because of cytopenias.  She is continuing dexamethasone 40 mg weekly. -We reviewed myeloma panel from 07/17/2019.  M spike is 0.3 g.  Immunofixation was polyclonal.  [place]  chain ratio is 1.93.  Kappa light chains are 37. - As her M spike has become positive again, I have recommended continuing the same regimen at this time. -We will see her back in 4 weeks for follow-up.  She is continuing to tolerate her Revlimid and Velcade very well.  2.  Bone strengthening: - She will continue monthly denosumab.  She is taking calcium and vitamin D.  3.   Hypokalemia: - Potassium is 3.5.  Creatinine is 1.8.  She will continue potassium supplements.   Total time spent is 25 minutes with more than 50% of the time spent face-to-face discussing lab results, treatment plan, counseling and coordination of care.  Orders placed this encounter:  Orders Placed This Encounter  Procedures  . CBC with Differential/Platelet  . Comprehensive metabolic panel  . Protein electrophoresis, serum  . Kappa/lambda light chains  . Lactate dehydrogenase      Derek Jack, MD  [place] [phone]

## 2019-08-22 NOTE — Patient Instructions (Signed)
[facility] at [facility] Discharge Instructions  You were seen today by Dr. Katragadda. He went over your recent lab results. He will see you back in 1 month for labs and follow up.   Thank you for choosing [facility] at [facility] to provide your oncology and hematology care.  To afford each patient quality time with our provider, please arrive at least 15 minutes before your scheduled appointment time.   If you have a lab appointment with the Cancer Center please come in thru the  Main Entrance and check in at the main information desk  You need to re-schedule your appointment should you arrive 10 or more minutes late.  We strive to give you quality time with our providers, and arriving late affects you and other patients whose appointments are after yours.  Also, if you no show three or more times for appointments you may be dismissed from the clinic at the providers discretion.     Again, thank you for choosing [facility].  Our hope is that these requests will decrease the amount of time that you wait before being seen by our physicians.       _____________________________________________________________  Should you have questions after your visit to [facility], please contact our office at [phone] between the hours of 8:00 a.m. and 4:30 p.m.  Voicemails left after 4:00 p.m. will not be returned until the following business day.  For prescription refill requests, have your pharmacy contact our office and allow 72 hours.    Cancer Center Support Programs:   > Cancer Support Group  2nd Tuesday of the month 1pm-2pm, Journey Room    

## 2019-08-22 NOTE — Progress Notes (Signed)
Ser Creat 1.38 today.  Patient seen by Dr Delton Coombes with lab review and ok to treat today verbal order.   Patient tolerated injection with no complaints voiced.  Site clean and dry with no bruising or swelling noted at site.  Band aid applied.  Vss with discharge and left ambulatory with no s/s of distress noted.

## 2019-08-23 LAB — PROTEIN ELECTROPHORESIS, SERUM
A/G Ratio: 1.1 (ref 0.7–1.7)
Albumin ELP: 3.3 g/dL (ref 2.9–4.4)
Alpha-1-Globulin: 0.2 g/dL (ref 0.0–0.4)
Alpha-2-Globulin: 0.9 g/dL (ref 0.4–1.0)
Beta Globulin: 1.1 g/dL (ref 0.7–1.3)
Gamma Globulin: 0.9 g/dL (ref 0.4–1.8)
Globulin, Total: 3.1 g/dL (ref 2.2–3.9)
M-Spike, %: 0.4 g/dL — ABNORMAL HIGH
Total Protein ELP: 6.4 g/dL (ref 6.0–8.5)

## 2019-08-23 LAB — IMMUNOFIXATION ELECTROPHORESIS
IgA: 416 mg/dL (ref 64–422)
IgG (Immunoglobin G), Serum: 922 mg/dL (ref 586–1602)
IgM (Immunoglobulin M), Srm: 99 mg/dL (ref 26–217)
Total Protein ELP: 6.6 g/dL (ref 6.0–8.5)

## 2019-08-23 LAB — KAPPA/LAMBDA LIGHT CHAINS
Kappa free light chain: 42.6 mg/L — ABNORMAL HIGH (ref 3.3–19.4)
Kappa, lambda light chain ratio: 1.51 (ref 0.26–1.65)
Lambda free light chains: 28.2 mg/L — ABNORMAL HIGH (ref 5.7–26.3)

## 2019-08-29 ENCOUNTER — Other Ambulatory Visit (HOSPITAL_COMMUNITY): Payer: Self-pay | Admitting: Nurse Practitioner

## 2019-08-29 DIAGNOSIS — E876 Hypokalemia: Secondary | ICD-10-CM

## 2019-08-31 ENCOUNTER — Other Ambulatory Visit (HOSPITAL_COMMUNITY): Payer: Self-pay | Admitting: *Deleted

## 2019-08-31 DIAGNOSIS — C9000 Multiple myeloma not having achieved remission: Secondary | ICD-10-CM

## 2019-08-31 MED ORDER — LENALIDOMIDE 20 MG PO CAPS: ORAL_CAPSULE | ORAL | 0 refills | Status: DC

## 2019-08-31 NOTE — Telephone Encounter (Signed)
Chart reviewed, revlimid refilled. 

## 2019-09-05 ENCOUNTER — Inpatient Hospital Stay (HOSPITAL_COMMUNITY): Payer: Medicare Other

## 2019-09-05 ENCOUNTER — Other Ambulatory Visit (HOSPITAL_COMMUNITY): Payer: Self-pay | Admitting: Nurse Practitioner

## 2019-09-05 ENCOUNTER — Inpatient Hospital Stay (HOSPITAL_COMMUNITY): Payer: Medicare Other | Attending: Hematology

## 2019-09-05 ENCOUNTER — Encounter (HOSPITAL_COMMUNITY): Payer: Self-pay

## 2019-09-05 ENCOUNTER — Other Ambulatory Visit: Payer: Self-pay

## 2019-09-05 VITALS — BP 137/57 | HR 62 | Temp 97.1°F | Resp 18

## 2019-09-05 DIAGNOSIS — C9000 Multiple myeloma not having achieved remission: Secondary | ICD-10-CM | POA: Insufficient documentation

## 2019-09-05 DIAGNOSIS — E876 Hypokalemia: Secondary | ICD-10-CM | POA: Insufficient documentation

## 2019-09-05 DIAGNOSIS — Z5112 Encounter for antineoplastic immunotherapy: Secondary | ICD-10-CM | POA: Diagnosis present

## 2019-09-05 DIAGNOSIS — E538 Deficiency of other specified B group vitamins: Secondary | ICD-10-CM

## 2019-09-05 LAB — CBC WITH DIFFERENTIAL/PLATELET
Abs Immature Granulocytes: 0.02 10*3/uL (ref 0.00–0.07)
Basophils Absolute: 0 10*3/uL (ref 0.0–0.1)
Basophils Relative: 1 %
Eosinophils Absolute: 0.1 10*3/uL (ref 0.0–0.5)
Eosinophils Relative: 2 %
HCT: 32.4 % — ABNORMAL LOW (ref 36.0–46.0)
Hemoglobin: 10.6 g/dL — ABNORMAL LOW (ref 12.0–15.0)
Immature Granulocytes: 0 %
Lymphocytes Relative: 24 %
Lymphs Abs: 1.1 10*3/uL (ref 0.7–4.0)
MCH: 33.4 pg (ref 26.0–34.0)
MCHC: 32.7 g/dL (ref 30.0–36.0)
MCV: 102.2 fL — ABNORMAL HIGH (ref 80.0–100.0)
Monocytes Absolute: 0.6 10*3/uL (ref 0.1–1.0)
Monocytes Relative: 13 %
Neutro Abs: 2.7 10*3/uL (ref 1.7–7.7)
Neutrophils Relative %: 60 %
Platelets: 233 10*3/uL (ref 150–400)
RBC: 3.17 MIL/uL — ABNORMAL LOW (ref 3.87–5.11)
RDW: 15 % (ref 11.5–15.5)
WBC: 4.5 10*3/uL (ref 4.0–10.5)
nRBC: 0 % (ref 0.0–0.2)

## 2019-09-05 LAB — COMPREHENSIVE METABOLIC PANEL
ALT: 12 U/L (ref 0–44)
AST: 12 U/L — ABNORMAL LOW (ref 15–41)
Albumin: 3.6 g/dL (ref 3.5–5.0)
Alkaline Phosphatase: 91 U/L (ref 38–126)
Anion gap: 10 (ref 5–15)
BUN: 30 mg/dL — ABNORMAL HIGH (ref 8–23)
CO2: 23 mmol/L (ref 22–32)
Calcium: 9.1 mg/dL (ref 8.9–10.3)
Chloride: 100 mmol/L (ref 98–111)
Creatinine, Ser: 1.55 mg/dL — ABNORMAL HIGH (ref 0.44–1.00)
GFR calc Af Amer: 37 mL/min — ABNORMAL LOW (ref >60)
GFR calc non Af Amer: 32 mL/min — ABNORMAL LOW (ref >60)
Glucose, Bld: 280 mg/dL — ABNORMAL HIGH (ref 70–99)
Potassium: 3.7 mmol/L (ref 3.5–5.1)
Sodium: 133 mmol/L — ABNORMAL LOW (ref 135–145)
Total Bilirubin: 0.6 mg/dL (ref 0.3–1.2)
Total Protein: 6.9 g/dL (ref 6.5–8.1)

## 2019-09-05 LAB — LACTATE DEHYDROGENASE: LDH: 140 U/L (ref 98–192)

## 2019-09-05 MED ORDER — DEXAMETHASONE 4 MG PO TABS
40.0000 mg | ORAL_TABLET | Freq: Once | ORAL | Status: AC
  Administered 2019-09-05: 40 mg via ORAL
  Filled 2019-09-05: qty 10

## 2019-09-05 MED ORDER — EPOETIN ALFA 20000 UNIT/ML IJ SOLN: 20000.0000 [IU] | Freq: Once | INTRAMUSCULAR | Status: DC

## 2019-09-05 MED ORDER — BORTEZOMIB CHEMO SQ INJECTION 3.5 MG (2.5MG/ML)
1.3000 mg/m2 | Freq: Once | INTRAMUSCULAR | Status: AC
  Administered 2019-09-05: 2.25 mg via SUBCUTANEOUS
  Filled 2019-09-05: qty 0.9

## 2019-09-05 MED ORDER — EPOETIN ALFA-EPBX 10000 UNIT/ML IJ SOLN
20000.0000 [IU] | Freq: Once | INTRAMUSCULAR | Status: AC
  Administered 2019-09-05: 20000 [IU] via SUBCUTANEOUS
  Filled 2019-09-05: qty 2

## 2019-09-05 MED ORDER — PROCHLORPERAZINE MALEATE 10 MG PO TABS
10.0000 mg | ORAL_TABLET | Freq: Once | ORAL | Status: AC
  Administered 2019-09-05: 10 mg via ORAL
  Filled 2019-09-05: qty 1

## 2019-09-05 NOTE — Progress Notes (Signed)
Pt presents today for Velcade and Retacrit. VS within parameters for tx. Labs reviewed by [facility] NP. Proceed with treatment today. Creat. 1.55 and NP reviewed and aware.   Velcade given today per MD orders. Tolerated  without adverse affects. Vital signs stable. No complaints at this time. Discharged from clinic ambulatory. F/U with [facility] as scheduled.   Brittany Archer presents today for injection per MD orders. Retacrit administered SQ in right Upper Arm. Administration without incident. Patient tolerated well.

## 2019-09-05 NOTE — Patient Instructions (Signed)
[facility] Discharge Instructions for Patients Receiving Chemotherapy  Today you received the following chemotherapy agents   To help prevent nausea and vomiting after your treatment, we encourage you to take your nausea medication   If you develop nausea and vomiting that is not controlled by your nausea medication, call the clinic.   BELOW ARE SYMPTOMS THAT SHOULD BE REPORTED IMMEDIATELY:  *FEVER GREATER THAN 100.5 F  *CHILLS WITH OR WITHOUT FEVER  NAUSEA AND VOMITING THAT IS NOT CONTROLLED WITH YOUR NAUSEA MEDICATION  *UNUSUAL SHORTNESS OF BREATH  *UNUSUAL BRUISING OR BLEEDING  TENDERNESS IN MOUTH AND THROAT WITH OR WITHOUT PRESENCE OF ULCERS  *URINARY PROBLEMS  *BOWEL PROBLEMS  UNUSUAL RASH Items with * indicate a potential emergency and should be followed up as soon as possible.  Feel free to call the clinic should you have any questions or concerns. The clinic phone number is [phone].  Please show the CHEMO ALERT CARD at check-in to the Emergency Department and triage nurse.   

## 2019-09-06 LAB — PROTEIN ELECTROPHORESIS, SERUM
A/G Ratio: 1 (ref 0.7–1.7)
Albumin ELP: 3.4 g/dL (ref 2.9–4.4)
Alpha-1-Globulin: 0.2 g/dL (ref 0.0–0.4)
Alpha-2-Globulin: 1.2 g/dL — ABNORMAL HIGH (ref 0.4–1.0)
Beta Globulin: 1.3 g/dL (ref 0.7–1.3)
Gamma Globulin: 0.7 g/dL (ref 0.4–1.8)
Globulin, Total: 3.3 g/dL (ref 2.2–3.9)
M-Spike, %: 0.4 g/dL — ABNORMAL HIGH
Total Protein ELP: 6.7 g/dL (ref 6.0–8.5)

## 2019-09-06 LAB — KAPPA/LAMBDA LIGHT CHAINS
Kappa free light chain: 38.9 mg/L — ABNORMAL HIGH (ref 3.3–19.4)
Kappa, lambda light chain ratio: 1.6 (ref 0.26–1.65)
Lambda free light chains: 24.3 mg/L (ref 5.7–26.3)

## 2019-09-08 ENCOUNTER — Telehealth (HOSPITAL_COMMUNITY): Payer: Self-pay | Admitting: *Deleted

## 2019-09-08 NOTE — Telephone Encounter (Signed)
A representative from [employer] called and stated she could not get in contact with this pt about medication refills. Called the pt's sister and sister stated she was living with her brother and give me the number. Called pt at brother's home and asked if could give the number to the [place] pt stated it was okay to give pharmacy the number. Called back to [employer] and spoke with representative and give the number where the pt can be reached.

## 2019-09-12 ENCOUNTER — Encounter (HOSPITAL_COMMUNITY): Payer: Self-pay

## 2019-09-12 ENCOUNTER — Other Ambulatory Visit: Payer: Self-pay

## 2019-09-12 ENCOUNTER — Inpatient Hospital Stay (HOSPITAL_COMMUNITY): Payer: Medicare Other | Attending: Hematology

## 2019-09-12 ENCOUNTER — Inpatient Hospital Stay (HOSPITAL_COMMUNITY): Payer: Medicare Other

## 2019-09-12 VITALS — BP 150/61 | HR 63 | Temp 96.9°F | Resp 18 | Wt 167.0 lb

## 2019-09-12 DIAGNOSIS — Z5112 Encounter for antineoplastic immunotherapy: Secondary | ICD-10-CM | POA: Insufficient documentation

## 2019-09-12 DIAGNOSIS — E538 Deficiency of other specified B group vitamins: Secondary | ICD-10-CM | POA: Insufficient documentation

## 2019-09-12 DIAGNOSIS — N189 Chronic kidney disease, unspecified: Secondary | ICD-10-CM | POA: Diagnosis not present

## 2019-09-12 DIAGNOSIS — D649 Anemia, unspecified: Secondary | ICD-10-CM

## 2019-09-12 DIAGNOSIS — E876 Hypokalemia: Secondary | ICD-10-CM

## 2019-09-12 DIAGNOSIS — C9000 Multiple myeloma not having achieved remission: Secondary | ICD-10-CM | POA: Insufficient documentation

## 2019-09-12 DIAGNOSIS — Z5111 Encounter for antineoplastic chemotherapy: Secondary | ICD-10-CM

## 2019-09-12 DIAGNOSIS — D631 Anemia in chronic kidney disease: Secondary | ICD-10-CM | POA: Insufficient documentation

## 2019-09-12 LAB — COMPREHENSIVE METABOLIC PANEL
ALT: 10 U/L (ref 0–44)
AST: 11 U/L — ABNORMAL LOW (ref 15–41)
Albumin: 3.1 g/dL — ABNORMAL LOW (ref 3.5–5.0)
Alkaline Phosphatase: 86 U/L (ref 38–126)
Anion gap: 8 (ref 5–15)
BUN: 22 mg/dL (ref 8–23)
CO2: 25 mmol/L (ref 22–32)
Calcium: 7.9 mg/dL — ABNORMAL LOW (ref 8.9–10.3)
Chloride: 106 mmol/L (ref 98–111)
Creatinine, Ser: 1.32 mg/dL — ABNORMAL HIGH (ref 0.44–1.00)
GFR calc Af Amer: 44 mL/min — ABNORMAL LOW (ref >60)
GFR calc non Af Amer: 38 mL/min — ABNORMAL LOW (ref >60)
Glucose, Bld: 272 mg/dL — ABNORMAL HIGH (ref 70–99)
Potassium: 3 mmol/L — ABNORMAL LOW (ref 3.5–5.1)
Sodium: 139 mmol/L (ref 135–145)
Total Bilirubin: 0.7 mg/dL (ref 0.3–1.2)
Total Protein: 6.4 g/dL — ABNORMAL LOW (ref 6.5–8.1)

## 2019-09-12 LAB — CBC WITH DIFFERENTIAL/PLATELET
Abs Immature Granulocytes: 0.01 10*3/uL (ref 0.00–0.07)
Basophils Absolute: 0 10*3/uL (ref 0.0–0.1)
Basophils Relative: 1 %
Eosinophils Absolute: 0.1 10*3/uL (ref 0.0–0.5)
Eosinophils Relative: 2 %
HCT: 30.1 % — ABNORMAL LOW (ref 36.0–46.0)
Hemoglobin: 10 g/dL — ABNORMAL LOW (ref 12.0–15.0)
Immature Granulocytes: 0 %
Lymphocytes Relative: 26 %
Lymphs Abs: 1 10*3/uL (ref 0.7–4.0)
MCH: 33.9 pg (ref 26.0–34.0)
MCHC: 33.2 g/dL (ref 30.0–36.0)
MCV: 102 fL — ABNORMAL HIGH (ref 80.0–100.0)
Monocytes Absolute: 0.4 10*3/uL (ref 0.1–1.0)
Monocytes Relative: 11 %
Neutro Abs: 2.4 10*3/uL (ref 1.7–7.7)
Neutrophils Relative %: 60 %
Platelets: 164 10*3/uL (ref 150–400)
RBC: 2.95 MIL/uL — ABNORMAL LOW (ref 3.87–5.11)
RDW: 15.9 % — ABNORMAL HIGH (ref 11.5–15.5)
WBC: 4 10*3/uL (ref 4.0–10.5)
nRBC: 0.8 % — ABNORMAL HIGH (ref 0.0–0.2)

## 2019-09-12 MED ORDER — DIPHENHYDRAMINE HCL 50 MG/ML IJ SOLN: 50.0000 mg | Freq: Once | INTRAMUSCULAR | Status: DC | PRN

## 2019-09-12 MED ORDER — EPINEPHRINE HCL 0.1 MG/ML IJ SOLN: 0.2500 mg | Freq: Once | INTRAMUSCULAR | Status: DC | PRN

## 2019-09-12 MED ORDER — CYANOCOBALAMIN 1000 MCG/ML IJ SOLN
1000.0000 ug | Freq: Once | INTRAMUSCULAR | Status: AC
  Administered 2019-09-12: 1000 ug via INTRAMUSCULAR
  Filled 2019-09-12: qty 1

## 2019-09-12 MED ORDER — EPOETIN ALFA-EPBX 10000 UNIT/ML IJ SOLN
20000.0000 [IU] | Freq: Once | INTRAMUSCULAR | Status: AC
  Administered 2019-09-12: 13:00:00 20000 [IU] via SUBCUTANEOUS
  Filled 2019-09-12: qty 2

## 2019-09-12 MED ORDER — ALBUTEROL SULFATE (2.5 MG/3ML) 0.083% IN NEBU: 2.5000 mg | INHALATION_SOLUTION | Freq: Once | RESPIRATORY_TRACT | Status: DC | PRN

## 2019-09-12 MED ORDER — DIPHENHYDRAMINE HCL 50 MG/ML IJ SOLN: 25.0000 mg | Freq: Once | INTRAMUSCULAR | Status: DC | PRN

## 2019-09-12 MED ORDER — POTASSIUM CHLORIDE CRYS ER 20 MEQ PO TBCR
EXTENDED_RELEASE_TABLET | ORAL | Status: AC
  Filled 2019-09-12: qty 2

## 2019-09-12 MED ORDER — SODIUM CHLORIDE 0.9 % IV SOLN: Freq: Once | INTRAVENOUS | Status: DC | PRN

## 2019-09-12 MED ORDER — METHYLPREDNISOLONE SODIUM SUCC 125 MG IJ SOLR: 125.0000 mg | Freq: Once | INTRAMUSCULAR | Status: DC | PRN

## 2019-09-12 MED ORDER — POTASSIUM CHLORIDE CRYS ER 20 MEQ PO TBCR
40.0000 meq | EXTENDED_RELEASE_TABLET | Freq: Once | ORAL | Status: AC
  Administered 2019-09-12: 40 meq via ORAL

## 2019-09-12 MED ORDER — BORTEZOMIB CHEMO SQ INJECTION 3.5 MG (2.5MG/ML)
1.3000 mg/m2 | Freq: Once | INTRAMUSCULAR | Status: AC
  Administered 2019-09-12: 2.25 mg via SUBCUTANEOUS
  Filled 2019-09-12: qty 0.9

## 2019-09-12 MED ORDER — PROCHLORPERAZINE MALEATE 10 MG PO TABS
10.0000 mg | ORAL_TABLET | Freq: Once | ORAL | Status: AC
  Administered 2019-09-12: 10 mg via ORAL
  Filled 2019-09-12: qty 1

## 2019-09-12 MED ORDER — DEXAMETHASONE 4 MG PO TABS
40.0000 mg | ORAL_TABLET | Freq: Once | ORAL | Status: AC
  Administered 2019-09-12: 40 mg via ORAL
  Filled 2019-09-12: qty 10

## 2019-09-12 MED ORDER — DENOSUMAB 120 MG/1.7ML ~~LOC~~ SOLN
120.0000 mg | Freq: Once | SUBCUTANEOUS | Status: AC
  Administered 2019-09-12: 120 mg via SUBCUTANEOUS
  Filled 2019-09-12: qty 1.7

## 2019-09-12 NOTE — Patient Instructions (Signed)
[facility] Discharge Instructions for Patients Receiving Chemotherapy  Today you received the following chemotherapy agents   To help prevent nausea and vomiting after your treatment, we encourage you to take your nausea medication   If you develop nausea and vomiting that is not controlled by your nausea medication, call the clinic.   BELOW ARE SYMPTOMS THAT SHOULD BE REPORTED IMMEDIATELY:  *FEVER GREATER THAN 100.5 F  *CHILLS WITH OR WITHOUT FEVER  NAUSEA AND VOMITING THAT IS NOT CONTROLLED WITH YOUR NAUSEA MEDICATION  *UNUSUAL SHORTNESS OF BREATH  *UNUSUAL BRUISING OR BLEEDING  TENDERNESS IN MOUTH AND THROAT WITH OR WITHOUT PRESENCE OF ULCERS  *URINARY PROBLEMS  *BOWEL PROBLEMS  UNUSUAL RASH Items with * indicate a potential emergency and should be followed up as soon as possible.  Feel free to call the clinic should you have any questions or concerns. The clinic phone number is [phone].  Please show the CHEMO ALERT CARD at check-in to the Emergency Department and triage nurse.   

## 2019-09-12 NOTE — Progress Notes (Signed)
Velcade, xgeva, B12, retacrit given per orders. See MAR for details. Patient tolerated it well without problems. Vitals stable and discharged home from clinic ambulatory. Follow up as scheduled.

## 2019-09-19 ENCOUNTER — Other Ambulatory Visit: Payer: Self-pay

## 2019-09-19 ENCOUNTER — Inpatient Hospital Stay (HOSPITAL_COMMUNITY): Payer: Medicare Other

## 2019-09-19 ENCOUNTER — Inpatient Hospital Stay (HOSPITAL_BASED_OUTPATIENT_CLINIC_OR_DEPARTMENT_OTHER): Payer: Medicare Other | Admitting: Hematology

## 2019-09-19 ENCOUNTER — Encounter (HOSPITAL_COMMUNITY): Payer: Self-pay | Admitting: Hematology

## 2019-09-19 VITALS — BP 142/64 | HR 64 | Temp 97.3°F | Resp 18 | Wt 162.0 lb

## 2019-09-19 VITALS — BP 116/46 | HR 56 | Temp 97.8°F | Resp 18

## 2019-09-19 DIAGNOSIS — C9000 Multiple myeloma not having achieved remission: Secondary | ICD-10-CM

## 2019-09-19 DIAGNOSIS — Z5112 Encounter for antineoplastic immunotherapy: Secondary | ICD-10-CM | POA: Diagnosis not present

## 2019-09-19 LAB — CBC WITH DIFFERENTIAL/PLATELET
Abs Immature Granulocytes: 0.01 10*3/uL (ref 0.00–0.07)
Basophils Absolute: 0 10*3/uL (ref 0.0–0.1)
Basophils Relative: 0 %
Eosinophils Absolute: 0.1 10*3/uL (ref 0.0–0.5)
Eosinophils Relative: 1 %
HCT: 34.9 % — ABNORMAL LOW (ref 36.0–46.0)
Hemoglobin: 11.1 g/dL — ABNORMAL LOW (ref 12.0–15.0)
Immature Granulocytes: 0 %
Lymphocytes Relative: 17 %
Lymphs Abs: 0.8 10*3/uL (ref 0.7–4.0)
MCH: 32.6 pg (ref 26.0–34.0)
MCHC: 31.8 g/dL (ref 30.0–36.0)
MCV: 102.6 fL — ABNORMAL HIGH (ref 80.0–100.0)
Monocytes Absolute: 0.3 10*3/uL (ref 0.1–1.0)
Monocytes Relative: 6 %
Neutro Abs: 3.3 10*3/uL (ref 1.7–7.7)
Neutrophils Relative %: 76 %
Platelets: 159 10*3/uL (ref 150–400)
RBC: 3.4 MIL/uL — ABNORMAL LOW (ref 3.87–5.11)
RDW: 17.2 % — ABNORMAL HIGH (ref 11.5–15.5)
WBC: 4.4 10*3/uL (ref 4.0–10.5)
nRBC: 0 % (ref 0.0–0.2)

## 2019-09-19 LAB — COMPREHENSIVE METABOLIC PANEL
ALT: 9 U/L (ref 0–44)
AST: 10 U/L — ABNORMAL LOW (ref 15–41)
Albumin: 3.5 g/dL (ref 3.5–5.0)
Alkaline Phosphatase: 69 U/L (ref 38–126)
Anion gap: 10 (ref 5–15)
BUN: 17 mg/dL (ref 8–23)
CO2: 28 mmol/L (ref 22–32)
Calcium: 9.8 mg/dL (ref 8.9–10.3)
Chloride: 99 mmol/L (ref 98–111)
Creatinine, Ser: 0.95 mg/dL (ref 0.44–1.00)
GFR calc Af Amer: >60 mL/min (ref >60)
GFR calc non Af Amer: 57 mL/min — ABNORMAL LOW (ref >60)
Glucose, Bld: 188 mg/dL — ABNORMAL HIGH (ref 70–99)
Potassium: 2.6 mmol/L — CL (ref 3.5–5.1)
Sodium: 137 mmol/L (ref 135–145)
Total Bilirubin: 0.8 mg/dL (ref 0.3–1.2)
Total Protein: 7 g/dL (ref 6.5–8.1)

## 2019-09-19 MED ORDER — POTASSIUM CHLORIDE CRYS ER 20 MEQ PO TBCR
40.0000 meq | EXTENDED_RELEASE_TABLET | Freq: Once | ORAL | Status: AC
  Administered 2019-09-19: 12:00:00 40 meq via ORAL
  Filled 2019-09-19: qty 2

## 2019-09-19 MED ORDER — SODIUM CHLORIDE 0.9 % IV SOLN
INTRAVENOUS | Status: DC
  Administered 2019-09-19: 12:00:00 via INTRAVENOUS

## 2019-09-19 MED ORDER — BORTEZOMIB CHEMO SQ INJECTION 3.5 MG (2.5MG/ML)
1.3000 mg/m2 | Freq: Once | INTRAMUSCULAR | Status: AC
  Administered 2019-09-19: 2.25 mg via SUBCUTANEOUS
  Filled 2019-09-19: qty 0.9

## 2019-09-19 MED ORDER — ONDANSETRON HCL 4 MG/2ML IJ SOLN
INTRAMUSCULAR | Status: AC
  Filled 2019-09-19: qty 2

## 2019-09-19 MED ORDER — PROCHLORPERAZINE MALEATE 10 MG PO TABS
10.0000 mg | ORAL_TABLET | Freq: Once | ORAL | Status: DC
  Filled 2019-09-19: qty 1

## 2019-09-19 MED ORDER — POTASSIUM CHLORIDE 10 MEQ/100ML IV SOLN
10.0000 meq | INTRAVENOUS | Status: AC
  Administered 2019-09-19: 12:00:00 10 meq via INTRAVENOUS
  Filled 2019-09-19 (×2): qty 100

## 2019-09-19 MED ORDER — DEXAMETHASONE 4 MG PO TABS
40.0000 mg | ORAL_TABLET | Freq: Once | ORAL | Status: AC
  Administered 2019-09-19: 40 mg via ORAL
  Filled 2019-09-19: qty 10

## 2019-09-19 MED ORDER — ONDANSETRON HCL 4 MG/2ML IJ SOLN
4.0000 mg | Freq: Once | INTRAMUSCULAR | Status: AC
  Administered 2019-09-19: 4 mg via INTRAVENOUS

## 2019-09-19 MED ORDER — POTASSIUM CHLORIDE CRYS ER 20 MEQ PO TBCR
40.0000 meq | EXTENDED_RELEASE_TABLET | Freq: Once | ORAL | Status: AC
  Administered 2019-09-19: 40 meq via ORAL
  Filled 2019-09-19: qty 2

## 2019-09-19 NOTE — Assessment & Plan Note (Addendum)
1.  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  Beta-2 MG of 3.2, LDH normal.  SPEP with 0.9 g/dL, free light chain ratio of 114, kappa light chains of 7096. - Skeletal survey on 02/16/2018 shows subtle patchy areas of osteopenia of the thoracolumbar spine and possibly distal right clavicle which may reflect subtle changes of multiple myeloma. - RVD started on 01/26/2018. - Revlimid dose reduced to 20 mg 2 weeks on 1 week off because of cytopenias.  She is continuing dexamethasone 40 mg weekly. - We reviewed myeloma panel from 09/05/2019.  M spike is stable at 0.4 g.  This has become positive to a month ago.  Free light chain ratio is 1.6, previously 1.5.  Kappa light chains are 38.9, previously 42.6.  Hemoglobin is 11.1. - She is tolerating Revlimid and Velcade very well.  She will continue with it at this time. - I plan to repeat her myeloma panel in 4 weeks and see her back in 5 weeks for follow-up.  2.  Bone strengthening: - She will continue monthly denosumab.  She will continue calcium and vitamin D.  3.  Hypokalemia: - Potassium is 2.9 today.  She is currently on potassium 20 mEq 3 times a day.  She reports that she definitely takes the morning dose but sometimes forgets the afternoon and evening doses. - We have started giving her potassium 20 mEq IV through peripheral line.  She felt burning sensation in the vein and had a vasovagal episode.  Hence we discontinued potassium.  She felt better after 500 mL of normal saline. -She was given oral potassium 80 mEq and was reinforced to take potassium 3 times a day.  4.  Normocytic anemia: -She was started on Retacrit 20,000 units on 09/05/2019.  Hemoglobin today improved 11.1.

## 2019-09-19 NOTE — Patient Instructions (Addendum)
[facility] at [facility] Discharge Instructions  You were seen today by Dr. Delton Coombes. He went over your recent lab results. Continue taking your Revlimid as well as your potassium. Continue injections as scheduled. He will see you back in 5 weeks for labs and follow up.   Thank you for choosing [facility] at [facility] to provide your oncology and hematology care.  To afford each patient quality time with our provider, please arrive at least 15 minutes before your scheduled appointment time.   If you have a lab appointment with the [facility] please come in thru the  Main Entrance and check in at the main information desk  You need to re-schedule your appointment should you arrive 10 or more minutes late.  We strive to give you quality time with our providers, and arriving late affects you and other patients whose appointments are after yours.  Also, if you no show three or more times for appointments you may be dismissed from the clinic at the providers discretion.     Again, thank you for choosing [facility].  Our hope is that these requests will decrease the amount of time that you wait before being seen by our physicians.       _____________________________________________________________  Should you have questions after your visit to [facility], please contact our office at [phone] between the hours of 8:00 a.m. and 4:30 p.m.  Voicemails left after 4:00 p.m. will not be returned until the following business day.  For prescription refill requests, have your pharmacy contact our office and allow 72 hours.    Cancer Center Support Programs:   > Cancer Support Group  2nd Tuesday of the month 1pm-2pm, Journey Room

## 2019-09-19 NOTE — Progress Notes (Signed)
1218 potassium run was hung. At 1235, patient complained of IV burning. Nurse into room to evaluate IV site, no reddness, or swelling but nurse decreased iv rate and placed a warm blanket on iv site. Stayed in room to see if this helped, patient started complaining of being hot, nauseated, complained of her left arm muscle hurting. I stopped the potassium at 1243. I hung an new bag of saline, vitals obtained and MD notified to come to room to check on patient.  Repeated vitals, orders to stop potassium and discontinue for today. Will give IVF's and zofran per orders, will monitor patient for another 45 min. Vitals returned to normal, patient states she is feeling better. Was able to eat some chips and drink soda, took the rest of her medications for treatment today. Velcade given per orders.     Vitals stable and discharged home from clinic ambulatory. Follow up as scheduled.

## 2019-09-19 NOTE — Progress Notes (Signed)
CRITICAL VALUE ALERT  Critical Value:  K+ 2.6  Date & Time Notied:  09/19/2019 at 1140  Provider Notified: Dr. Delton Coombes  Orders Received/Actions taken: give KCl 20 mEq IV and K-dur 40 mEq po pre and post infusion.  Proceed with Velcade today as scheduled.

## 2019-09-19 NOTE — Progress Notes (Signed)
Brittany Archer, [place] [address]   CLINIC:  Medical Oncology/Hematology  PCP:  Vesta Mixer [address] [place] [address] [phone]   REASON FOR VISIT:  Follow-up for IgA Kappa Multiple Myeloma   CURRENT THERAPY: RVD  BRIEF ONCOLOGIC HISTORY:  Oncology History  Multiple myeloma not having achieved remission ([place])  01/20/2018 Initial Diagnosis   Multiple myeloma not having achieved remission ([place])   01/26/2018 -  Chemotherapy   The patient had bortezomib SQ (VELCADE) chemo injection 2.5 mg, 1.3 mg/m2 = 2.5 mg, Subcutaneous,  Once, 22 of 26 cycles Administration: 2.5 mg (01/26/2018), 2.5 mg (02/02/2018), 2.5 mg (02/09/2018), 2.5 mg (02/16/2018), 2.5 mg (02/23/2018), 2.5 mg (03/02/2018), 2.5 mg (03/09/2018), 2.5 mg (03/30/2018), 2.5 mg (04/06/2018), 2.5 mg (04/13/2018), 2.5 mg (04/21/2018), 2.5 mg (05/06/2018), 2.5 mg (05/11/2018), 2.5 mg (05/20/2018), 2.5 mg (05/27/2018), 2.5 mg (06/10/2018), 2.5 mg (06/17/2018), 2.5 mg (06/24/2018), 2.5 mg (07/08/2018), 2.5 mg (07/15/2018), 2.5 mg (07/22/2018), 2.5 mg (08/05/2018), 2.5 mg (08/12/2018), 2.5 mg (09/02/2018), 2.5 mg (09/09/2018), 2.5 mg (09/16/2018), 2.5 mg (09/30/2018), 2.5 mg (10/07/2018), 2.5 mg (10/14/2018), 2.5 mg (10/31/2018), 2.5 mg (11/07/2018), 2.5 mg (11/14/2018), 2.5 mg (11/28/2018), 2.25 mg (12/05/2018), 2.25 mg (12/12/2018), 2.25 mg (12/26/2018), 2.25 mg (01/02/2019), 2.25 mg (01/09/2019), 2.25 mg (01/23/2019), 2.25 mg (01/30/2019), 2.25 mg (02/06/2019), 2.25 mg (02/20/2019), 2.25 mg (02/27/2019), 2.25 mg (03/06/2019), 2.25 mg (03/20/2019), 2.25 mg (03/27/2019), 2.25 mg (04/03/2019), 2.25 mg (04/17/2019), 2.25 mg (04/24/2019), 2.25 mg (05/01/2019), 2.25 mg (05/15/2019), 2.25 mg (05/23/2019), 2.25 mg (05/30/2019), 2.25 mg (06/13/2019), 2.25 mg (06/20/2019), 2.25 mg (06/27/2019), 2.25 mg (07/11/2019), 2.25 mg (07/18/2019), 2.25 mg (07/25/2019), 2.25 mg (08/08/2019), 2.25 mg (08/15/2019), 2.25 mg (08/22/2019), 2.25 mg (09/05/2019), 2.25 mg  (09/12/2019)  for chemotherapy treatment.         INTERVAL HISTORY:  Brittany Archer 78 y.o. female seen for follow-up of multiple myeloma.  Appetite is 100%.  Energy levels are 75%.  She is accompanied by her relative.  She has been taking Revlimid as prescribed.  She also reports taking potassium 3 times a day, but often misses the afternoon and evening doses.  Denies any chest pains or palpitations.  She is also receiving Procrit 20,000 units which is helping.  No new pains were reported.   REVIEW OF SYSTEMS:  Review of Systems  All other systems reviewed and are negative.    PAST MEDICAL/SURGICAL HISTORY:  Past Medical History:  Diagnosis Date   Breast cancer ([place])    left breast/ 2008/ surg/ rad tx   Coronary artery disease    Diabetes mellitus    Past Surgical History:  Procedure Laterality Date   ABDOMINAL HYSTERECTOMY     BREAST SURGERY       SOCIAL HISTORY:  Social History   Socioeconomic History   Marital status: Divorced    Spouse name: Not on file   Number of children: Not on file   Years of education: Not on file   Highest education level: Not on file  Occupational History   Not on file  Social Needs   Financial resource strain: Not on file   Food insecurity    Worry: Not on file    Inability: Not on file   Transportation needs    Medical: Not on file    Non-medical: Not on file  Tobacco Use   Smoking status: Never Smoker   Smokeless tobacco: Never Used  Substance and Sexual Activity   Alcohol use:  No   Drug use: No   Sexual activity: Yes    Birth control/protection: Surgical  Lifestyle   Physical activity    Days per week: Not on file    Minutes per session: Not on file   Stress: Not on file  Relationships   Social connections    Talks on phone: Not on file    Gets together: Not on file    Attends religious service: Not on file    Active member of club or organization: Not on file    Attends meetings of clubs or  organizations: Not on file    Relationship status: Not on file   Intimate partner violence    Fear of current or ex partner: Not on file    Emotionally abused: Not on file    Physically abused: Not on file    Forced sexual activity: Not on file  Other Topics Concern   Not on file  Social History Narrative   Not on file    FAMILY HISTORY:  Family History  Problem Relation Age of Onset   Obesity Sister     CURRENT MEDICATIONS:  Outpatient Encounter Medications as of 09/19/2019  Medication Sig   acyclovir (ZOVIRAX) 400 MG tablet Take 1 tablet (400 mg total) by mouth 2 (two) times daily.   aspirin 81 MG tablet Take 81 mg by mouth daily.     bortezomib IV (VELCADE) 3.5 MG injection Inject into the vein once. weekly   cholecalciferol (VITAMIN D) 1000 units tablet Take 1,000 Units by mouth daily.   Denosumab (XGEVA [place]) Inject into the skin. Every 28 days   dexamethasone (DECADRON) 4 MG tablet Take 10 tablets (40 mg) on days 1, 8, and 15 of chemo. Repeat every 21 days.   glipiZIDE (GLUCOTROL) 5 MG tablet Take 5 mg by mouth daily before breakfast.    lenalidomide (REVLIMID) 20 MG capsule Take 1 capsule by mouth once daily for 14 days on, and 7 days off of a 21 day cycle.   lisinopril-hydrochlorothiazide (PRINZIDE,ZESTORETIC) 20-25 MG tablet Take 1 tablet by mouth daily.    metFORMIN (GLUCOPHAGE) 1000 MG tablet Take 1,000 mg by mouth 2 times daily at 12 noon and 4 pm.     potassium chloride SA (K-DUR) 20 MEQ tablet TAKE (1) TABLET BY MOUTH THREE TIMES DAILY   acetaminophen (TYLENOL) 500 MG tablet Take 500 mg by mouth every 6 (six) hours as needed for mild pain or moderate pain.   ondansetron (ZOFRAN) 8 MG tablet Take 1 tablet (8 mg total) by mouth 2 (two) times daily as needed (Nausea or vomiting). (Patient not taking: Reported on 09/19/2019)   prochlorperazine (COMPAZINE) 10 MG tablet Take 1 tablet (10 mg total) by mouth every 6 (six) hours as needed (Nausea or vomiting).  (Patient not taking: Reported on 09/19/2019)   No facility-administered encounter medications on file as of 09/19/2019.     ALLERGIES:  Allergies  Allergen Reactions   Motrin [Ibuprofen] Rash     PHYSICAL EXAM:  ECOG Performance status: 1  Vitals:   09/19/19 1133  BP: (!) 142/64  Pulse: 64  Resp: 18  Temp: (!) 97.3 F (36.3 C)  SpO2: 99%   Filed Weights   09/19/19 1133  Weight: 162 lb (73.5 kg)    Physical Exam Vitals signs reviewed.  Constitutional:      Appearance: Normal appearance. She is obese.  HENT:     Head: Normocephalic.     Nose: Nose normal.  Mouth/Throat:     Mouth: Mucous membranes are moist.     Pharynx: Oropharynx is clear.  Eyes:     Extraocular Movements: Extraocular movements intact.     Conjunctiva/sclera: Conjunctivae normal.  Neck:     Musculoskeletal: Normal range of motion.  Cardiovascular:     Rate and Rhythm: Normal rate and regular rhythm.     Pulses: Normal pulses.     Heart sounds: Normal heart sounds.  Pulmonary:     Effort: Pulmonary effort is normal.     Breath sounds: Normal breath sounds.  Abdominal:     General: Bowel sounds are normal.     Palpations: Abdomen is soft.  Musculoskeletal: Normal range of motion.  Skin:    General: Skin is warm and dry.  Neurological:     General: No focal deficit present.     Mental Status: She is alert and oriented to person, place, and time. Mental status is at baseline.  Psychiatric:        Mood and Affect: Mood normal.        Behavior: Behavior normal.        Thought Content: Thought content normal.        Judgment: Judgment normal.      LABORATORY DATA:  I have reviewed the labs as listed.  CBC    Component Value Date/Time   WBC 4.4 09/19/2019 1100   RBC 3.40 (L) 09/19/2019 1100   HGB 11.1 (L) 09/19/2019 1100   HCT 34.9 (L) 09/19/2019 1100   PLT 159 09/19/2019 1100   MCV 102.6 (H) 09/19/2019 1100   MCH 32.6 09/19/2019 1100   MCHC 31.8 09/19/2019 1100   RDW 17.2  (H) 09/19/2019 1100   LYMPHSABS 0.8 09/19/2019 1100   MONOABS 0.3 09/19/2019 1100   EOSABS 0.1 09/19/2019 1100   BASOSABS 0.0 09/19/2019 1100   CMP Latest Ref Rng & Units 09/19/2019 09/12/2019 09/05/2019  Glucose 70 - 99 mg/dL 188(H) 272(H) 280(H)  BUN 8 - 23 mg/dL 17 22 30(H)  Creatinine 0.44 - 1.00 mg/dL 0.95 1.32(H) 1.55(H)  Sodium 135 - 145 mmol/L 137 139 133(L)  Potassium 3.5 - 5.1 mmol/L 2.6(LL) 3.0(L) 3.7  Chloride 98 - 111 mmol/L 99 106 100  CO2 22 - 32 mmol/L _0 Calcium 8.9 - 10.3 mg/dL 9.8 7.9(L) 9.1  Total Protein 6.5 - 8.1 g/dL 7.0 6.4(L) 6.9  Total Bilirubin 0.3 - 1.2 mg/dL 0.8 0.7 0.6  Alkaline Phos 38 - 126 U/L 69 86 91  AST 15 - 41 U/L 10(L) 11(L) 12(L)  ALT 0 - 44 U/L _1 I have independently reviewed her scans and discussed with the patient.   ASSESSMENT & PLAN:   Multiple myeloma not having achieved remission ([place]) 1.  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  Beta-2 MG of 3.2, LDH normal.  SPEP with 0.9 g/dL, free light chain ratio of 114, kappa light chains of 7096. - Skeletal survey on 02/16/2018 shows subtle patchy areas of osteopenia of the thoracolumbar spine and possibly distal right clavicle which may reflect subtle changes of multiple myeloma. - RVD started on 01/26/2018. - Revlimid dose reduced to 20 mg 2 weeks on 1 week off because of cytopenias.  She is continuing dexamethasone 40 mg weekly. - We  reviewed myeloma panel from 09/05/2019.  M spike is stable at 0.4 g.  This has become positive to a month ago.  Free light chain ratio is 1.6, previously 1.5.  Kappa light chains are 38.9, previously 42.6.  Hemoglobin is 11.1. - She is tolerating Revlimid and Velcade very well.  She will continue with it at this time. - I plan to repeat her myeloma panel in 4 weeks and see her  back in 5 weeks for follow-up.  2.  Bone strengthening: - She will continue monthly denosumab.  She will continue calcium and vitamin D.  3.  Hypokalemia: - Potassium is 2.9 today.  She is currently on potassium 20 mEq 3 times a day.  She reports that she definitely takes the morning dose but sometimes forgets the afternoon and evening doses. - We have started giving her potassium 20 mEq IV through peripheral line.  She felt burning sensation in the vein and had a vasovagal episode.  Hence we discontinued potassium.  She felt better after 500 mL of normal saline. -She was given oral potassium 80 mEq and was reinforced to take potassium 3 times a day.  4.  Normocytic anemia: -She was started on Retacrit 20,000 units on 09/05/2019.  Hemoglobin today improved 11.1.   Total time spent is 40 minutes with more than 50% of the time spent face-to-face discussing treatment plan, counseling and coordination of care.  Part of this 40 minutes was spent face-to-face when responding to the reaction for IV potassium.  Orders placed this encounter:  Orders Placed This Encounter  Procedures   CBC with Differential/Platelet   Comprehensive metabolic panel   Protein electrophoresis, serum   Kappa/lambda light chains   Lactate dehydrogenase      Derek Jack, MD  [place] [phone]

## 2019-09-20 NOTE — Patient Instructions (Signed)
[facility] Discharge Instructions for Patients Receiving Chemotherapy  Today you received the following chemotherapy agents   To help prevent nausea and vomiting after your treatment, we encourage you to take your nausea medication   If you develop nausea and vomiting that is not controlled by your nausea medication, call the clinic.   BELOW ARE SYMPTOMS THAT SHOULD BE REPORTED IMMEDIATELY:  *FEVER GREATER THAN 100.5 F  *CHILLS WITH OR WITHOUT FEVER  NAUSEA AND VOMITING THAT IS NOT CONTROLLED WITH YOUR NAUSEA MEDICATION  *UNUSUAL SHORTNESS OF BREATH  *UNUSUAL BRUISING OR BLEEDING  TENDERNESS IN MOUTH AND THROAT WITH OR WITHOUT PRESENCE OF ULCERS  *URINARY PROBLEMS  *BOWEL PROBLEMS  UNUSUAL RASH Items with * indicate a potential emergency and should be followed up as soon as possible.  Feel free to call the clinic should you have any questions or concerns. The clinic phone number is [phone].  Please show the CHEMO ALERT CARD at check-in to the Emergency Department and triage nurse.   

## 2019-09-25 ENCOUNTER — Other Ambulatory Visit (HOSPITAL_COMMUNITY): Payer: Self-pay | Admitting: *Deleted

## 2019-09-25 DIAGNOSIS — C9000 Multiple myeloma not having achieved remission: Secondary | ICD-10-CM

## 2019-09-25 MED ORDER — LENALIDOMIDE 20 MG PO CAPS: ORAL_CAPSULE | ORAL | 0 refills | Status: DC

## 2019-09-25 NOTE — Telephone Encounter (Signed)
Chart reviewed, Revlimid refilled.   

## 2019-10-03 ENCOUNTER — Other Ambulatory Visit: Payer: Self-pay

## 2019-10-03 ENCOUNTER — Encounter (HOSPITAL_COMMUNITY): Payer: Self-pay

## 2019-10-03 ENCOUNTER — Inpatient Hospital Stay (HOSPITAL_COMMUNITY): Payer: Medicare Other | Attending: Hematology

## 2019-10-03 ENCOUNTER — Inpatient Hospital Stay (HOSPITAL_COMMUNITY): Payer: Medicare Other

## 2019-10-03 VITALS — BP 129/56 | HR 66 | Temp 97.7°F | Resp 18 | Wt 162.0 lb

## 2019-10-03 DIAGNOSIS — Z5112 Encounter for antineoplastic immunotherapy: Secondary | ICD-10-CM | POA: Insufficient documentation

## 2019-10-03 DIAGNOSIS — C9000 Multiple myeloma not having achieved remission: Secondary | ICD-10-CM

## 2019-10-03 DIAGNOSIS — D631 Anemia in chronic kidney disease: Secondary | ICD-10-CM | POA: Insufficient documentation

## 2019-10-03 DIAGNOSIS — E538 Deficiency of other specified B group vitamins: Secondary | ICD-10-CM

## 2019-10-03 DIAGNOSIS — N189 Chronic kidney disease, unspecified: Secondary | ICD-10-CM | POA: Diagnosis not present

## 2019-10-03 LAB — CBC WITH DIFFERENTIAL/PLATELET
Abs Immature Granulocytes: 0.03 10*3/uL (ref 0.00–0.07)
Basophils Absolute: 0 10*3/uL (ref 0.0–0.1)
Basophils Relative: 1 %
Eosinophils Absolute: 0.1 10*3/uL (ref 0.0–0.5)
Eosinophils Relative: 2 %
HCT: 30.8 % — ABNORMAL LOW (ref 36.0–46.0)
Hemoglobin: 9.8 g/dL — ABNORMAL LOW (ref 12.0–15.0)
Immature Granulocytes: 1 %
Lymphocytes Relative: 30 %
Lymphs Abs: 1.2 10*3/uL (ref 0.7–4.0)
MCH: 33 pg (ref 26.0–34.0)
MCHC: 31.8 g/dL (ref 30.0–36.0)
MCV: 103.7 fL — ABNORMAL HIGH (ref 80.0–100.0)
Monocytes Absolute: 0.4 10*3/uL (ref 0.1–1.0)
Monocytes Relative: 9 %
Neutro Abs: 2.4 10*3/uL (ref 1.7–7.7)
Neutrophils Relative %: 57 %
Platelets: 257 10*3/uL (ref 150–400)
RBC: 2.97 MIL/uL — ABNORMAL LOW (ref 3.87–5.11)
RDW: 15.9 % — ABNORMAL HIGH (ref 11.5–15.5)
WBC: 4.1 10*3/uL (ref 4.0–10.5)
nRBC: 0 % (ref 0.0–0.2)

## 2019-10-03 LAB — COMPREHENSIVE METABOLIC PANEL
ALT: 9 U/L (ref 0–44)
AST: 11 U/L — ABNORMAL LOW (ref 15–41)
Albumin: 3.6 g/dL (ref 3.5–5.0)
Alkaline Phosphatase: 66 U/L (ref 38–126)
Anion gap: 10 (ref 5–15)
BUN: 18 mg/dL (ref 8–23)
CO2: 27 mmol/L (ref 22–32)
Calcium: 9.6 mg/dL (ref 8.9–10.3)
Chloride: 103 mmol/L (ref 98–111)
Creatinine, Ser: 0.98 mg/dL (ref 0.44–1.00)
GFR calc Af Amer: >60 mL/min (ref >60)
GFR calc non Af Amer: 55 mL/min — ABNORMAL LOW (ref >60)
Glucose, Bld: 97 mg/dL (ref 70–99)
Potassium: 3.3 mmol/L — ABNORMAL LOW (ref 3.5–5.1)
Sodium: 140 mmol/L (ref 135–145)
Total Bilirubin: 0.6 mg/dL (ref 0.3–1.2)
Total Protein: 7.5 g/dL (ref 6.5–8.1)

## 2019-10-03 MED ORDER — BORTEZOMIB CHEMO SQ INJECTION 3.5 MG (2.5MG/ML)
1.3000 mg/m2 | Freq: Once | INTRAMUSCULAR | Status: AC
  Administered 2019-10-03: 2.25 mg via SUBCUTANEOUS
  Filled 2019-10-03: qty 0.9

## 2019-10-03 MED ORDER — PROCHLORPERAZINE MALEATE 10 MG PO TABS
10.0000 mg | ORAL_TABLET | Freq: Once | ORAL | Status: AC
  Administered 2019-10-03: 14:00:00 10 mg via ORAL
  Filled 2019-10-03: qty 1

## 2019-10-03 MED ORDER — DEXAMETHASONE 4 MG PO TABS
40.0000 mg | ORAL_TABLET | Freq: Once | ORAL | Status: AC
  Administered 2019-10-03: 14:00:00 40 mg via ORAL
  Filled 2019-10-03: qty 10

## 2019-10-03 MED ORDER — EPOETIN ALFA-EPBX 10000 UNIT/ML IJ SOLN
20000.0000 [IU] | Freq: Once | INTRAMUSCULAR | Status: AC
  Administered 2019-10-03: 20000 [IU] via SUBCUTANEOUS
  Filled 2019-10-03: qty 2

## 2019-10-03 NOTE — Progress Notes (Signed)
Patient tolerated injection with no complaints voiced.  Site clean and dry with no bruising or swelling noted at site.  Band aid applied.  Vss with discharge and left ambulatory with no s/s of distress noted.  

## 2019-10-10 ENCOUNTER — Other Ambulatory Visit: Payer: Self-pay

## 2019-10-10 ENCOUNTER — Inpatient Hospital Stay (HOSPITAL_COMMUNITY): Payer: Medicare Other

## 2019-10-10 ENCOUNTER — Encounter (HOSPITAL_COMMUNITY): Payer: Self-pay

## 2019-10-10 VITALS — BP 141/76 | HR 76 | Temp 98.0°F | Resp 18 | Wt 162.6 lb

## 2019-10-10 DIAGNOSIS — Z5112 Encounter for antineoplastic immunotherapy: Secondary | ICD-10-CM | POA: Diagnosis not present

## 2019-10-10 DIAGNOSIS — E538 Deficiency of other specified B group vitamins: Secondary | ICD-10-CM

## 2019-10-10 DIAGNOSIS — C9000 Multiple myeloma not having achieved remission: Secondary | ICD-10-CM

## 2019-10-10 LAB — CBC WITH DIFFERENTIAL/PLATELET
Abs Immature Granulocytes: 0.04 10*3/uL (ref 0.00–0.07)
Basophils Absolute: 0 10*3/uL (ref 0.0–0.1)
Basophils Relative: 1 %
Eosinophils Absolute: 0.1 10*3/uL (ref 0.0–0.5)
Eosinophils Relative: 2 %
HCT: 30.5 % — ABNORMAL LOW (ref 36.0–46.0)
Hemoglobin: 9.7 g/dL — ABNORMAL LOW (ref 12.0–15.0)
Immature Granulocytes: 1 %
Lymphocytes Relative: 23 %
Lymphs Abs: 1.1 10*3/uL (ref 0.7–4.0)
MCH: 32.9 pg (ref 26.0–34.0)
MCHC: 31.8 g/dL (ref 30.0–36.0)
MCV: 103.4 fL — ABNORMAL HIGH (ref 80.0–100.0)
Monocytes Absolute: 0.4 10*3/uL (ref 0.1–1.0)
Monocytes Relative: 8 %
Neutro Abs: 3 10*3/uL (ref 1.7–7.7)
Neutrophils Relative %: 65 %
Platelets: 221 10*3/uL (ref 150–400)
RBC: 2.95 MIL/uL — ABNORMAL LOW (ref 3.87–5.11)
RDW: 16.8 % — ABNORMAL HIGH (ref 11.5–15.5)
WBC: 4.7 10*3/uL (ref 4.0–10.5)
nRBC: 0.4 % — ABNORMAL HIGH (ref 0.0–0.2)

## 2019-10-10 LAB — COMPREHENSIVE METABOLIC PANEL
ALT: <5 U/L (ref 0–44)
AST: 9 U/L — ABNORMAL LOW (ref 15–41)
Albumin: 3.3 g/dL — ABNORMAL LOW (ref 3.5–5.0)
Alkaline Phosphatase: 64 U/L (ref 38–126)
Anion gap: 11 (ref 5–15)
BUN: 16 mg/dL (ref 8–23)
CO2: 23 mmol/L (ref 22–32)
Calcium: 9 mg/dL (ref 8.9–10.3)
Chloride: 103 mmol/L (ref 98–111)
Creatinine, Ser: 1.14 mg/dL — ABNORMAL HIGH (ref 0.44–1.00)
GFR calc Af Amer: 53 mL/min — ABNORMAL LOW (ref >60)
GFR calc non Af Amer: 46 mL/min — ABNORMAL LOW (ref >60)
Glucose, Bld: 183 mg/dL — ABNORMAL HIGH (ref 70–99)
Potassium: 3.4 mmol/L — ABNORMAL LOW (ref 3.5–5.1)
Sodium: 137 mmol/L (ref 135–145)
Total Bilirubin: 0.8 mg/dL (ref 0.3–1.2)
Total Protein: 6.9 g/dL (ref 6.5–8.1)

## 2019-10-10 MED ORDER — DEXAMETHASONE 4 MG PO TABS: 40.0000 mg | ORAL_TABLET | Freq: Once | ORAL | Status: DC

## 2019-10-10 MED ORDER — BORTEZOMIB CHEMO SQ INJECTION 3.5 MG (2.5MG/ML)
1.3000 mg/m2 | Freq: Once | INTRAMUSCULAR | Status: AC
  Administered 2019-10-10: 2.25 mg via SUBCUTANEOUS
  Filled 2019-10-10: qty 0.9

## 2019-10-10 MED ORDER — DEXAMETHASONE 4 MG PO TABS
40.0000 mg | ORAL_TABLET | Freq: Once | ORAL | Status: AC
  Administered 2019-10-10: 13:00:00 40 mg via ORAL
  Filled 2019-10-10: qty 10

## 2019-10-10 MED ORDER — PROCHLORPERAZINE MALEATE 10 MG PO TABS
10.0000 mg | ORAL_TABLET | Freq: Once | ORAL | Status: AC
  Administered 2019-10-10: 13:00:00 10 mg via ORAL
  Filled 2019-10-10: qty 1

## 2019-10-10 MED ORDER — EPOETIN ALFA-EPBX 10000 UNIT/ML IJ SOLN
20000.0000 [IU] | Freq: Once | INTRAMUSCULAR | Status: AC
  Administered 2019-10-10: 13:00:00 20000 [IU] via SUBCUTANEOUS
  Filled 2019-10-10: qty 2

## 2019-10-10 MED ORDER — DENOSUMAB 120 MG/1.7ML ~~LOC~~ SOLN
120.0000 mg | Freq: Once | SUBCUTANEOUS | Status: AC
  Administered 2019-10-10: 13:00:00 120 mg via SUBCUTANEOUS
  Filled 2019-10-10: qty 1.7

## 2019-10-10 NOTE — Patient Instructions (Signed)
[place] at [facility] Discharge Instructions  Received Velcade,Retacrit and Delton See injections today. Follow-up as scheduled. Call clinic for any questions or concerns   Thank you for choosing [place] at [facility] to provide your oncology and hematology care.  To afford each patient quality time with our provider, please arrive at least 15 minutes before your scheduled appointment time.   If you have a lab appointment with the [facility] please come in thru the Main Entrance and check in at the main information desk.  You need to re-schedule your appointment should you arrive 10 or more minutes late.  We strive to give you quality time with our providers, and arriving late affects you and other patients whose appointments are after yours.  Also, if you no show three or more times for appointments you may be dismissed from the clinic at the providers discretion.     Again, thank you for choosing [facility].  Our hope is that these requests will decrease the amount of time that you wait before being seen by our physicians.       _____________________________________________________________  Should you have questions after your visit to [facility], please contact our office at [phone] between the hours of 8:00 a.m. and 4:30 p.m.  Voicemails left after 4:00 p.m. will not be returned until the following business day.  For prescription refill requests, have your pharmacy contact our office and allow 72 hours.    Due to Covid, you will need to wear a mask upon entering the hospital. If you do not have a mask, a mask will be given to you at the Main Entrance upon arrival. For doctor visits, patients may have 1 support person with them. For treatment visits, patients can not have anyone with them due to social distancing guidelines and our immunocompromised population.

## 2019-10-10 NOTE — Progress Notes (Signed)
Brittany Archer tolerated Velcade,Retacrit and Xgeva injections well without complaints or incident. Hgb 9.7 and Calcium 9 today. Pt denied any tooth or jaw pain and no recent or future dental visits prior to administering the Xgeva injection. VSS Pt discharged self ambulatory in satisfactory condition accompanied by family member

## 2019-10-17 ENCOUNTER — Encounter (HOSPITAL_COMMUNITY): Payer: Self-pay

## 2019-10-17 ENCOUNTER — Inpatient Hospital Stay (HOSPITAL_COMMUNITY): Payer: Medicare Other

## 2019-10-17 ENCOUNTER — Other Ambulatory Visit: Payer: Self-pay

## 2019-10-17 VITALS — BP 131/62 | HR 74 | Temp 97.1°F | Resp 18

## 2019-10-17 DIAGNOSIS — C9000 Multiple myeloma not having achieved remission: Secondary | ICD-10-CM

## 2019-10-17 DIAGNOSIS — E538 Deficiency of other specified B group vitamins: Secondary | ICD-10-CM

## 2019-10-17 DIAGNOSIS — Z5112 Encounter for antineoplastic immunotherapy: Secondary | ICD-10-CM | POA: Diagnosis not present

## 2019-10-17 LAB — LACTATE DEHYDROGENASE: LDH: 134 U/L (ref 98–192)

## 2019-10-17 LAB — COMPREHENSIVE METABOLIC PANEL
ALT: 9 U/L (ref 0–44)
AST: 11 U/L — ABNORMAL LOW (ref 15–41)
Albumin: 3.4 g/dL — ABNORMAL LOW (ref 3.5–5.0)
Alkaline Phosphatase: 65 U/L (ref 38–126)
Anion gap: 9 (ref 5–15)
BUN: 11 mg/dL (ref 8–23)
CO2: 25 mmol/L (ref 22–32)
Calcium: 8.6 mg/dL — ABNORMAL LOW (ref 8.9–10.3)
Chloride: 107 mmol/L (ref 98–111)
Creatinine, Ser: 1.09 mg/dL — ABNORMAL HIGH (ref 0.44–1.00)
GFR calc Af Amer: 56 mL/min — ABNORMAL LOW (ref >60)
GFR calc non Af Amer: 48 mL/min — ABNORMAL LOW (ref >60)
Glucose, Bld: 123 mg/dL — ABNORMAL HIGH (ref 70–99)
Potassium: 3.4 mmol/L — ABNORMAL LOW (ref 3.5–5.1)
Sodium: 141 mmol/L (ref 135–145)
Total Bilirubin: 1.2 mg/dL (ref 0.3–1.2)
Total Protein: 6.9 g/dL (ref 6.5–8.1)

## 2019-10-17 LAB — CBC WITH DIFFERENTIAL/PLATELET
Abs Immature Granulocytes: 0.02 10*3/uL (ref 0.00–0.07)
Basophils Absolute: 0 10*3/uL (ref 0.0–0.1)
Basophils Relative: 0 %
Eosinophils Absolute: 0.1 10*3/uL (ref 0.0–0.5)
Eosinophils Relative: 1 %
HCT: 31.1 % — ABNORMAL LOW (ref 36.0–46.0)
Hemoglobin: 9.7 g/dL — ABNORMAL LOW (ref 12.0–15.0)
Immature Granulocytes: 0 %
Lymphocytes Relative: 19 %
Lymphs Abs: 1 10*3/uL (ref 0.7–4.0)
MCH: 32.7 pg (ref 26.0–34.0)
MCHC: 31.2 g/dL (ref 30.0–36.0)
MCV: 104.7 fL — ABNORMAL HIGH (ref 80.0–100.0)
Monocytes Absolute: 0.4 10*3/uL (ref 0.1–1.0)
Monocytes Relative: 8 %
Neutro Abs: 3.8 10*3/uL (ref 1.7–7.7)
Neutrophils Relative %: 72 %
Platelets: 192 10*3/uL (ref 150–400)
RBC: 2.97 MIL/uL — ABNORMAL LOW (ref 3.87–5.11)
RDW: 17.8 % — ABNORMAL HIGH (ref 11.5–15.5)
WBC: 5.3 10*3/uL (ref 4.0–10.5)
nRBC: 0.4 % — ABNORMAL HIGH (ref 0.0–0.2)

## 2019-10-17 MED ORDER — DEXAMETHASONE 4 MG PO TABS
40.0000 mg | ORAL_TABLET | Freq: Once | ORAL | Status: AC
  Administered 2019-10-17: 14:00:00 40 mg via ORAL
  Filled 2019-10-17: qty 10

## 2019-10-17 MED ORDER — BORTEZOMIB CHEMO SQ INJECTION 3.5 MG (2.5MG/ML)
1.3000 mg/m2 | Freq: Once | INTRAMUSCULAR | Status: AC
  Administered 2019-10-17: 2.25 mg via SUBCUTANEOUS
  Filled 2019-10-17: qty 0.9

## 2019-10-17 MED ORDER — PROCHLORPERAZINE MALEATE 10 MG PO TABS
10.0000 mg | ORAL_TABLET | Freq: Once | ORAL | Status: AC
  Administered 2019-10-17: 10 mg via ORAL
  Filled 2019-10-17: qty 1

## 2019-10-17 MED ORDER — EPOETIN ALFA-EPBX 10000 UNIT/ML IJ SOLN
20000.0000 [IU] | Freq: Once | INTRAMUSCULAR | Status: AC
  Administered 2019-10-17: 20000 [IU] via SUBCUTANEOUS
  Filled 2019-10-17: qty 2

## 2019-10-17 NOTE — Patient Instructions (Signed)
[facility] Discharge Instructions for Patients Receiving Chemotherapy  Today you received the following chemotherapy agents   To help prevent nausea and vomiting after your treatment, we encourage you to take your nausea medication   If you develop nausea and vomiting that is not controlled by your nausea medication, call the clinic.   BELOW ARE SYMPTOMS THAT SHOULD BE REPORTED IMMEDIATELY:  *FEVER GREATER THAN 100.5 F  *CHILLS WITH OR WITHOUT FEVER  NAUSEA AND VOMITING THAT IS NOT CONTROLLED WITH YOUR NAUSEA MEDICATION  *UNUSUAL SHORTNESS OF BREATH  *UNUSUAL BRUISING OR BLEEDING  TENDERNESS IN MOUTH AND THROAT WITH OR WITHOUT PRESENCE OF ULCERS  *URINARY PROBLEMS  *BOWEL PROBLEMS  UNUSUAL RASH Items with * indicate a potential emergency and should be followed up as soon as possible.  Feel free to call the clinic should you have any questions or concerns. The clinic phone number is [phone].  Please show the CHEMO ALERT CARD at check-in to the Emergency Department and triage nurse.   

## 2019-10-17 NOTE — Progress Notes (Signed)
Brittany Archer presents today for injection per MD orders. Retacrit administered SQ in right Upper Arm. Administration without incident. Patient tolerated well.  Velcade given today. Treatment given today per MD orders. Tolerated without adverse affects. Vital signs stable. No complaints at this time. Discharged from clinic ambulatory. F/U with [facility] as scheduled.

## 2019-10-18 LAB — KAPPA/LAMBDA LIGHT CHAINS
Kappa free light chain: 35.2 mg/L — ABNORMAL HIGH (ref 3.3–19.4)
Kappa, lambda light chain ratio: 1.66 — ABNORMAL HIGH (ref 0.26–1.65)
Lambda free light chains: 21.2 mg/L (ref 5.7–26.3)

## 2019-10-19 ENCOUNTER — Other Ambulatory Visit (HOSPITAL_COMMUNITY): Payer: Self-pay | Admitting: *Deleted

## 2019-10-19 DIAGNOSIS — C9000 Multiple myeloma not having achieved remission: Secondary | ICD-10-CM

## 2019-10-19 LAB — PROTEIN ELECTROPHORESIS, SERUM
A/G Ratio: 1.1 (ref 0.7–1.7)
Albumin ELP: 3.3 g/dL (ref 2.9–4.4)
Alpha-1-Globulin: 0.2 g/dL (ref 0.0–0.4)
Alpha-2-Globulin: 1 g/dL (ref 0.4–1.0)
Beta Globulin: 1.1 g/dL (ref 0.7–1.3)
Gamma Globulin: 0.8 g/dL (ref 0.4–1.8)
Globulin, Total: 3.1 g/dL (ref 2.2–3.9)
M-Spike, %: 0.4 g/dL — ABNORMAL HIGH
Total Protein ELP: 6.4 g/dL (ref 6.0–8.5)

## 2019-10-19 MED ORDER — LENALIDOMIDE 20 MG PO CAPS: ORAL_CAPSULE | ORAL | 0 refills | Status: DC

## 2019-10-24 ENCOUNTER — Encounter (HOSPITAL_COMMUNITY): Payer: Self-pay | Admitting: Hematology

## 2019-10-24 ENCOUNTER — Inpatient Hospital Stay (HOSPITAL_BASED_OUTPATIENT_CLINIC_OR_DEPARTMENT_OTHER): Payer: Medicare Other | Admitting: Hematology

## 2019-10-24 ENCOUNTER — Other Ambulatory Visit: Payer: Self-pay

## 2019-10-24 VITALS — BP 131/56 | HR 64 | Temp 97.7°F | Resp 20 | Wt 163.1 lb

## 2019-10-24 DIAGNOSIS — C9000 Multiple myeloma not having achieved remission: Secondary | ICD-10-CM | POA: Diagnosis not present

## 2019-10-24 DIAGNOSIS — E538 Deficiency of other specified B group vitamins: Secondary | ICD-10-CM | POA: Diagnosis not present

## 2019-10-24 DIAGNOSIS — Z5112 Encounter for antineoplastic immunotherapy: Secondary | ICD-10-CM | POA: Diagnosis not present

## 2019-10-24 NOTE — Patient Instructions (Signed)
[place] at [facility] Discharge Instructions  You were seen today by Dr. Delton Coombes. He went over your recent lab results. Please continue getting labs and injections. He will see you back in 1 month for labs and follow up.   Thank you for choosing [place] at [facility] to provide your oncology and hematology care.  To afford each patient quality time with our provider, please arrive at least 15 minutes before your scheduled appointment time.   If you have a lab appointment with the [place] please come in thru the  Main Entrance and check in at the main information desk  You need to re-schedule your appointment should you arrive 10 or more minutes late.  We strive to give you quality time with our providers, and arriving late affects you and other patients whose appointments are after yours.  Also, if you no show three or more times for appointments you may be dismissed from the clinic at the providers discretion.     Again, thank you for choosing [facility].  Our hope is that these requests will decrease the amount of time that you wait before being seen by our physicians.       _____________________________________________________________  Should you have questions after your visit to [facility], please contact our office at [phone] between the hours of 8:00 a.m. and 4:30 p.m.  Voicemails left after 4:00 p.m. will not be returned until the following business day.  For prescription refill requests, have your pharmacy contact our office and allow 72 hours.    Cancer Center Support Programs:   > Cancer Support Group  2nd Tuesday of the month 1pm-2pm, Journey Room

## 2019-10-24 NOTE — Assessment & Plan Note (Signed)
1.  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  Beta-2 MG of 3.2, LDH normal.  SPEP with 0.9 g/dL, free light chain ratio of 114, kappa light chains of 7096. - Skeletal survey on 02/16/2018 shows subtle patchy areas of osteopenia of the thoracolumbar spine and possibly distal right clavicle which may reflect subtle changes of multiple myeloma. - RVD started on 01/26/2018. - Revlimid dose reduced to 20 mg 2 weeks on 1 week off because of cytopenias.  She is continuing dexamethasone 40 mg weekly. -We reviewed myeloma panel from 10/17/2019.  M spike is stable at 0.4.  Kappa light chains improved to 35.  Ratio is stable. -She is having difficulty taking the pills as prescribed.  She does have some dementia.  Our chemotherapy nurse has talked to her and we discarded leftover pills. -She will continue Velcade injections.  She is tolerating Velcade and Revlimid very well.  We will reevaluate her in 1 month for follow-up with repeat labs.  2.  Bone strengthening: -She will continue monthly denosumab.  She will continue calcium and vitamin D.  3.  Hypokalemia: -Potassium is 3.4 today.  She will continue potassium 20 mEq 3 times a day.  4.  Normocytic anemia: -She will continue Retacrit 20,000 units as needed.  This was started on 09/05/2019.

## 2019-10-24 NOTE — Progress Notes (Signed)
Brittany Archer, [place] [address]   CLINIC:  Medical Oncology/Hematology  PCP:  Vesta Mixer [address] [place] [address] [phone]   REASON FOR VISIT:  Follow-up for IgA Kappa Multiple Myeloma   CURRENT THERAPY: RVD  BRIEF ONCOLOGIC HISTORY:  Oncology History  Multiple myeloma not having achieved remission ([place])  01/20/2018 Initial Diagnosis   Multiple myeloma not having achieved remission ([place])   01/26/2018 -  Chemotherapy   The patient had bortezomib SQ (VELCADE) chemo injection 2.5 mg, 1.3 mg/m2 = 2.5 mg, Subcutaneous,  Once, 23 of 26 cycles Administration: 2.5 mg (01/26/2018), 2.5 mg (02/02/2018), 2.5 mg (02/09/2018), 2.5 mg (02/16/2018), 2.5 mg (02/23/2018), 2.5 mg (03/02/2018), 2.5 mg (03/09/2018), 2.5 mg (03/30/2018), 2.5 mg (04/06/2018), 2.5 mg (04/13/2018), 2.5 mg (04/21/2018), 2.5 mg (05/06/2018), 2.5 mg (05/11/2018), 2.5 mg (05/20/2018), 2.5 mg (05/27/2018), 2.5 mg (06/10/2018), 2.5 mg (06/17/2018), 2.5 mg (06/24/2018), 2.5 mg (07/08/2018), 2.5 mg (07/15/2018), 2.5 mg (07/22/2018), 2.5 mg (08/05/2018), 2.5 mg (08/12/2018), 2.5 mg (09/02/2018), 2.5 mg (09/09/2018), 2.5 mg (09/16/2018), 2.5 mg (09/30/2018), 2.5 mg (10/07/2018), 2.5 mg (10/14/2018), 2.5 mg (10/31/2018), 2.5 mg (11/07/2018), 2.5 mg (11/14/2018), 2.5 mg (11/28/2018), 2.25 mg (12/05/2018), 2.25 mg (12/12/2018), 2.25 mg (12/26/2018), 2.25 mg (01/02/2019), 2.25 mg (01/09/2019), 2.25 mg (01/23/2019), 2.25 mg (01/30/2019), 2.25 mg (02/06/2019), 2.25 mg (02/20/2019), 2.25 mg (02/27/2019), 2.25 mg (03/06/2019), 2.25 mg (03/20/2019), 2.25 mg (03/27/2019), 2.25 mg (04/03/2019), 2.25 mg (04/17/2019), 2.25 mg (04/24/2019), 2.25 mg (05/01/2019), 2.25 mg (05/15/2019), 2.25 mg (05/23/2019), 2.25 mg (05/30/2019), 2.25 mg (06/13/2019), 2.25 mg (06/20/2019), 2.25 mg (06/27/2019), 2.25 mg (07/11/2019), 2.25 mg (07/18/2019), 2.25 mg (07/25/2019), 2.25 mg (08/08/2019), 2.25 mg (08/15/2019), 2.25 mg (08/22/2019), 2.25 mg (09/05/2019), 2.25 mg  (09/12/2019), 2.25 mg (09/19/2019), 2.25 mg (10/03/2019), 2.25 mg (10/10/2019), 2.25 mg (10/17/2019)  for chemotherapy treatment.         INTERVAL HISTORY:  Brittany Archer 78 y.o. female seen for multiple myeloma.  Appetite and energy levels are reported as 100%.  Denies any nausea, vomiting, diarrhea or constipation.  Denies any tingling or numbness next images.  No skin rashes reported.  No fevers or infections reported.  No bleeding per rectum or melena.   REVIEW OF SYSTEMS:  Review of Systems  All other systems reviewed and are negative.    PAST MEDICAL/SURGICAL HISTORY:  Past Medical History:  Diagnosis Date   Breast cancer ([place])    left breast/ 2008/ surg/ rad tx   Coronary artery disease    Diabetes mellitus    Past Surgical History:  Procedure Laterality Date   ABDOMINAL HYSTERECTOMY     BREAST SURGERY       SOCIAL HISTORY:  Social History   Socioeconomic History   Marital status: Divorced    Spouse name: Not on file   Number of children: Not on file   Years of education: Not on file   Highest education level: Not on file  Occupational History   Not on file  Social Needs   Financial resource strain: Not on file   Food insecurity    Worry: Not on file    Inability: Not on file   Transportation needs    Medical: Not on file    Non-medical: Not on file  Tobacco Use   Smoking status: Never Smoker   Smokeless tobacco: Never Used  Substance and Sexual Activity   Alcohol use: No   Drug use: No   Sexual activity: Yes  Birth control/protection: Surgical  Lifestyle   Physical activity    Days per week: Not on file    Minutes per session: Not on file   Stress: Not on file  Relationships   Social connections    Talks on phone: Not on file    Gets together: Not on file    Attends religious service: Not on file    Active member of club or organization: Not on file    Attends meetings of clubs or organizations: Not on file     Relationship status: Not on file   Intimate partner violence    Fear of current or ex partner: Not on file    Emotionally abused: Not on file    Physically abused: Not on file    Forced sexual activity: Not on file  Other Topics Concern   Not on file  Social History Narrative   Not on file    FAMILY HISTORY:  Family History  Problem Relation Age of Onset   Obesity Sister     CURRENT MEDICATIONS:  Outpatient Encounter Medications as of 10/24/2019  Medication Sig   acyclovir (ZOVIRAX) 400 MG tablet Take 1 tablet (400 mg total) by mouth 2 (two) times daily.   aspirin 81 MG tablet Take 81 mg by mouth daily.     bortezomib IV (VELCADE) 3.5 MG injection Inject into the vein once. weekly   cholecalciferol (VITAMIN D) 1000 units tablet Take 1,000 Units by mouth daily.   Denosumab (XGEVA [place]) Inject into the skin. Every 28 days   dexamethasone (DECADRON) 4 MG tablet Take 10 tablets (40 mg) on days 1, 8, and 15 of chemo. Repeat every 21 days.   glipiZIDE (GLUCOTROL) 5 MG tablet Take 5 mg by mouth daily before breakfast.    lenalidomide (REVLIMID) 20 MG capsule Take 1 capsule by mouth once daily for 14 days on, and 7 days off of a 21 day cycle.   lisinopril-hydrochlorothiazide (PRINZIDE,ZESTORETIC) 20-25 MG tablet Take 1 tablet by mouth daily.    metFORMIN (GLUCOPHAGE) 1000 MG tablet Take 1,000 mg by mouth 2 times daily at 12 noon and 4 pm.     potassium chloride SA (K-DUR) 20 MEQ tablet TAKE (1) TABLET BY MOUTH THREE TIMES DAILY   acetaminophen (TYLENOL) 500 MG tablet Take 500 mg by mouth every 6 (six) hours as needed for mild pain or moderate pain.   ondansetron (ZOFRAN) 8 MG tablet Take 1 tablet (8 mg total) by mouth 2 (two) times daily as needed (Nausea or vomiting). (Patient not taking: Reported on 10/24/2019)   prochlorperazine (COMPAZINE) 10 MG tablet Take 1 tablet (10 mg total) by mouth every 6 (six) hours as needed (Nausea or vomiting). (Patient not taking: Reported  on 10/24/2019)   No facility-administered encounter medications on file as of 10/24/2019.     ALLERGIES:  Allergies  Allergen Reactions   Motrin [Ibuprofen] Rash     PHYSICAL EXAM:  ECOG Performance status: 1  Vitals:   10/24/19 1247  BP: (!) 131/56  Pulse: 64  Resp: 20  Temp: 97.7 F (36.5 C)  SpO2: 100%   Filed Weights   10/24/19 1247  Weight: 163 lb 1.6 oz (74 kg)    Physical Exam Vitals signs reviewed.  Constitutional:      Appearance: Normal appearance. She is obese.  HENT:     Head: Normocephalic.     Nose: Nose normal.     Mouth/Throat:     Mouth: Mucous membranes are moist.  Pharynx: Oropharynx is clear.  Eyes:     Extraocular Movements: Extraocular movements intact.     Conjunctiva/sclera: Conjunctivae normal.  Neck:     Musculoskeletal: Normal range of motion.  Cardiovascular:     Rate and Rhythm: Normal rate and regular rhythm.     Pulses: Normal pulses.     Heart sounds: Normal heart sounds.  Pulmonary:     Effort: Pulmonary effort is normal.     Breath sounds: Normal breath sounds.  Abdominal:     General: Bowel sounds are normal.     Palpations: Abdomen is soft.  Musculoskeletal: Normal range of motion.  Skin:    General: Skin is warm and dry.  Neurological:     General: No focal deficit present.     Mental Status: She is alert and oriented to person, place, and time. Mental status is at baseline.  Psychiatric:        Mood and Affect: Mood normal.        Behavior: Behavior normal.        Thought Content: Thought content normal.        Judgment: Judgment normal.      LABORATORY DATA:  I have reviewed the labs as listed.  CBC    Component Value Date/Time   WBC 5.3 10/17/2019 1221   RBC 2.97 (L) 10/17/2019 1221   HGB 9.7 (L) 10/17/2019 1221   HCT 31.1 (L) 10/17/2019 1221   PLT 192 10/17/2019 1221   MCV 104.7 (H) 10/17/2019 1221   MCH 32.7 10/17/2019 1221   MCHC 31.2 10/17/2019 1221   RDW 17.8 (H) 10/17/2019 1221    LYMPHSABS 1.0 10/17/2019 1221   MONOABS 0.4 10/17/2019 1221   EOSABS 0.1 10/17/2019 1221   BASOSABS 0.0 10/17/2019 1221   CMP Latest Ref Rng & Units 10/17/2019 10/10/2019 10/03/2019  Glucose 70 - 99 mg/dL 123(H) 183(H) 97  BUN 8 - 23 mg/dL _0 Creatinine 0.44 - 1.00 mg/dL 1.09(H) 1.14(H) 0.98  Sodium 135 - 145 mmol/L 141 137 140  Potassium 3.5 - 5.1 mmol/L 3.4(L) 3.4(L) 3.3(L)  Chloride 98 - 111 mmol/L 107 103 103  CO2 22 - 32 mmol/L _1 Calcium 8.9 - 10.3 mg/dL 8.6(L) 9.0 9.6  Total Protein 6.5 - 8.1 g/dL 6.9 6.9 7.5  Total Bilirubin 0.3 - 1.2 mg/dL 1.2 0.8 0.6  Alkaline Phos 38 - 126 U/L 65 64 66  AST 15 - 41 U/L 11(L) 9(L) 11(L)  ALT 0 - 44 U/L 9 <5 9      I have independently reviewed her scans and discussed with the patient.   ASSESSMENT & PLAN:   Multiple myeloma not having achieved remission ([place]) 1.  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  Beta-2 MG of 3.2, LDH normal.  SPEP with 0.9 g/dL, free light chain ratio of 114, kappa light chains of 7096. - Skeletal survey on 02/16/2018 shows subtle patchy areas of osteopenia of the thoracolumbar spine and possibly distal right clavicle which may reflect subtle changes of multiple myeloma. - RVD started on 01/26/2018. - Revlimid dose reduced to 20 mg 2 weeks on 1 week off because of cytopenias.  She is continuing dexamethasone 40 mg weekly. -We reviewed myeloma panel from 10/17/2019.  M spike is stable at 0.4.  Kappa light  chains improved to 35.  Ratio is stable. -She is having difficulty taking the pills as prescribed.  She does have some dementia.  Our chemotherapy nurse has talked to her and we discarded leftover pills. -She will continue Velcade injections.  She is tolerating Velcade and Revlimid very well.  We will reevaluate her in 1 month for  follow-up with repeat labs.  2.  Bone strengthening: -She will continue monthly denosumab.  She will continue calcium and vitamin D.  3.  Hypokalemia: -Potassium is 3.4 today.  She will continue potassium 20 mEq 3 times a day.  4.  Normocytic anemia: -She will continue Retacrit 20,000 units as needed.  This was started on 09/05/2019.   Total time spent is 25 minutes with more than 50% of the time spent face-to-face discussing lab results, treatment plan, counseling and coordination of care.  Orders placed this encounter:  Orders Placed This Encounter  Procedures   CBC with Differential/Platelet   Comprehensive metabolic panel   Protein electrophoresis, serum   Kappa/lambda light chains   Lactate dehydrogenase   Iron and TIBC   Ferritin   Vitamin B12   Folate      Derek Jack, MD  [place] [phone]

## 2019-10-31 ENCOUNTER — Other Ambulatory Visit: Payer: Self-pay

## 2019-10-31 ENCOUNTER — Inpatient Hospital Stay (HOSPITAL_COMMUNITY): Payer: Medicare Other

## 2019-10-31 ENCOUNTER — Inpatient Hospital Stay (HOSPITAL_COMMUNITY): Payer: Medicare Other | Attending: Hematology

## 2019-10-31 VITALS — BP 151/58 | HR 78 | Temp 97.6°F | Resp 18 | Wt 165.4 lb

## 2019-10-31 DIAGNOSIS — Z5112 Encounter for antineoplastic immunotherapy: Secondary | ICD-10-CM | POA: Diagnosis present

## 2019-10-31 DIAGNOSIS — N189 Chronic kidney disease, unspecified: Secondary | ICD-10-CM | POA: Diagnosis not present

## 2019-10-31 DIAGNOSIS — E876 Hypokalemia: Secondary | ICD-10-CM | POA: Diagnosis not present

## 2019-10-31 DIAGNOSIS — C9000 Multiple myeloma not having achieved remission: Secondary | ICD-10-CM

## 2019-10-31 DIAGNOSIS — E538 Deficiency of other specified B group vitamins: Secondary | ICD-10-CM | POA: Diagnosis not present

## 2019-10-31 DIAGNOSIS — D631 Anemia in chronic kidney disease: Secondary | ICD-10-CM | POA: Insufficient documentation

## 2019-10-31 LAB — COMPREHENSIVE METABOLIC PANEL
ALT: 8 U/L (ref 0–44)
AST: 8 U/L — ABNORMAL LOW (ref 15–41)
Albumin: 3.3 g/dL — ABNORMAL LOW (ref 3.5–5.0)
Alkaline Phosphatase: 66 U/L (ref 38–126)
Anion gap: 10 (ref 5–15)
BUN: 14 mg/dL (ref 8–23)
CO2: 26 mmol/L (ref 22–32)
Calcium: 8.5 mg/dL — ABNORMAL LOW (ref 8.9–10.3)
Chloride: 105 mmol/L (ref 98–111)
Creatinine, Ser: 1.05 mg/dL — ABNORMAL HIGH (ref 0.44–1.00)
GFR calc Af Amer: 58 mL/min — ABNORMAL LOW (ref >60)
GFR calc non Af Amer: 50 mL/min — ABNORMAL LOW (ref >60)
Glucose, Bld: 153 mg/dL — ABNORMAL HIGH (ref 70–99)
Potassium: 3.1 mmol/L — ABNORMAL LOW (ref 3.5–5.1)
Sodium: 141 mmol/L (ref 135–145)
Total Bilirubin: 0.9 mg/dL (ref 0.3–1.2)
Total Protein: 7.1 g/dL (ref 6.5–8.1)

## 2019-10-31 LAB — CBC WITH DIFFERENTIAL/PLATELET
Abs Immature Granulocytes: 0.01 10*3/uL (ref 0.00–0.07)
Basophils Absolute: 0 10*3/uL (ref 0.0–0.1)
Basophils Relative: 0 %
Eosinophils Absolute: 0.2 10*3/uL (ref 0.0–0.5)
Eosinophils Relative: 7 %
HCT: 29.1 % — ABNORMAL LOW (ref 36.0–46.0)
Hemoglobin: 9.1 g/dL — ABNORMAL LOW (ref 12.0–15.0)
Immature Granulocytes: 0 %
Lymphocytes Relative: 24 %
Lymphs Abs: 0.8 10*3/uL (ref 0.7–4.0)
MCH: 33.5 pg (ref 26.0–34.0)
MCHC: 31.3 g/dL (ref 30.0–36.0)
MCV: 107 fL — ABNORMAL HIGH (ref 80.0–100.0)
Monocytes Absolute: 0.2 10*3/uL (ref 0.1–1.0)
Monocytes Relative: 8 %
Neutro Abs: 2 10*3/uL (ref 1.7–7.7)
Neutrophils Relative %: 61 %
Platelets: 241 10*3/uL (ref 150–400)
RBC: 2.72 MIL/uL — ABNORMAL LOW (ref 3.87–5.11)
RDW: 16.3 % — ABNORMAL HIGH (ref 11.5–15.5)
WBC: 3.2 10*3/uL — ABNORMAL LOW (ref 4.0–10.5)
nRBC: 0 % (ref 0.0–0.2)

## 2019-10-31 MED ORDER — DEXAMETHASONE 4 MG PO TABS
40.0000 mg | ORAL_TABLET | Freq: Once | ORAL | Status: AC
  Administered 2019-10-31: 40 mg via ORAL
  Filled 2019-10-31: qty 10

## 2019-10-31 MED ORDER — BORTEZOMIB CHEMO SQ INJECTION 3.5 MG (2.5MG/ML)
1.3000 mg/m2 | Freq: Once | INTRAMUSCULAR | Status: AC
  Administered 2019-10-31: 2.25 mg via SUBCUTANEOUS
  Filled 2019-10-31: qty 0.9

## 2019-10-31 MED ORDER — PROCHLORPERAZINE MALEATE 10 MG PO TABS
10.0000 mg | ORAL_TABLET | Freq: Once | ORAL | Status: AC
  Administered 2019-10-31: 10 mg via ORAL
  Filled 2019-10-31: qty 1

## 2019-10-31 MED ORDER — EPOETIN ALFA-EPBX 10000 UNIT/ML IJ SOLN
20000.0000 [IU] | Freq: Once | INTRAMUSCULAR | Status: AC
  Administered 2019-10-31: 20000 [IU] via SUBCUTANEOUS
  Filled 2019-10-31: qty 2

## 2019-10-31 MED ORDER — CYANOCOBALAMIN 1000 MCG/ML IJ SOLN
INTRAMUSCULAR | Status: AC
  Filled 2019-10-31: qty 1

## 2019-10-31 MED ORDER — CYANOCOBALAMIN 1000 MCG/ML IJ SOLN
1000.0000 ug | Freq: Once | INTRAMUSCULAR | Status: AC
  Administered 2019-10-31: 1000 ug via INTRAMUSCULAR

## 2019-10-31 MED ORDER — POTASSIUM CHLORIDE CRYS ER 10 MEQ PO TBCR
40.0000 meq | EXTENDED_RELEASE_TABLET | Freq: Once | ORAL | Status: AC
  Administered 2019-10-31: 40 meq via ORAL
  Filled 2019-10-31: qty 4

## 2019-10-31 NOTE — Progress Notes (Signed)
Labs reviewed by RNester NP. VS and labs are within parameters for tx today. Message received to proceed with tx today.   Verbal Order received from [facility] NP to give potassium 40 meq x 1 dose today PO.   Treatment given today per MD orders. Tolerated infusion without adverse affects. Vital signs stable. No complaints at this time. Discharged from clinic ambulatory. F/U with [facility] as scheduled.

## 2019-10-31 NOTE — Patient Instructions (Signed)
[facility] Discharge Instructions for Patients Receiving Chemotherapy  Today you received the following chemotherapy agents   To help prevent nausea and vomiting after your treatment, we encourage you to take your nausea medication   If you develop nausea and vomiting that is not controlled by your nausea medication, call the clinic.   BELOW ARE SYMPTOMS THAT SHOULD BE REPORTED IMMEDIATELY:  *FEVER GREATER THAN 100.5 F  *CHILLS WITH OR WITHOUT FEVER  NAUSEA AND VOMITING THAT IS NOT CONTROLLED WITH YOUR NAUSEA MEDICATION  *UNUSUAL SHORTNESS OF BREATH  *UNUSUAL BRUISING OR BLEEDING  TENDERNESS IN MOUTH AND THROAT WITH OR WITHOUT PRESENCE OF ULCERS  *URINARY PROBLEMS  *BOWEL PROBLEMS  UNUSUAL RASH Items with * indicate a potential emergency and should be followed up as soon as possible.  Feel free to call the clinic should you have any questions or concerns. The clinic phone number is [phone].  Please show the CHEMO ALERT CARD at check-in to the Emergency Department and triage nurse.   

## 2019-11-07 ENCOUNTER — Inpatient Hospital Stay (HOSPITAL_COMMUNITY): Payer: Medicare Other

## 2019-11-07 ENCOUNTER — Other Ambulatory Visit: Payer: Self-pay

## 2019-11-07 VITALS — BP 117/57 | HR 67 | Temp 96.9°F | Resp 18

## 2019-11-07 DIAGNOSIS — Z5112 Encounter for antineoplastic immunotherapy: Secondary | ICD-10-CM | POA: Diagnosis not present

## 2019-11-07 DIAGNOSIS — E538 Deficiency of other specified B group vitamins: Secondary | ICD-10-CM

## 2019-11-07 DIAGNOSIS — C9000 Multiple myeloma not having achieved remission: Secondary | ICD-10-CM

## 2019-11-07 DIAGNOSIS — E876 Hypokalemia: Secondary | ICD-10-CM

## 2019-11-07 LAB — CBC WITH DIFFERENTIAL/PLATELET
Abs Immature Granulocytes: 0.03 10*3/uL (ref 0.00–0.07)
Basophils Absolute: 0 10*3/uL (ref 0.0–0.1)
Basophils Relative: 0 %
Eosinophils Absolute: 0.1 10*3/uL (ref 0.0–0.5)
Eosinophils Relative: 3 %
HCT: 28.8 % — ABNORMAL LOW (ref 36.0–46.0)
Hemoglobin: 9.2 g/dL — ABNORMAL LOW (ref 12.0–15.0)
Immature Granulocytes: 1 %
Lymphocytes Relative: 18 %
Lymphs Abs: 0.9 10*3/uL (ref 0.7–4.0)
MCH: 33 pg (ref 26.0–34.0)
MCHC: 31.9 g/dL (ref 30.0–36.0)
MCV: 103.2 fL — ABNORMAL HIGH (ref 80.0–100.0)
Monocytes Absolute: 0.6 10*3/uL (ref 0.1–1.0)
Monocytes Relative: 12 %
Neutro Abs: 3.2 10*3/uL (ref 1.7–7.7)
Neutrophils Relative %: 66 %
Platelets: 235 10*3/uL (ref 150–400)
RBC: 2.79 MIL/uL — ABNORMAL LOW (ref 3.87–5.11)
RDW: 15.9 % — ABNORMAL HIGH (ref 11.5–15.5)
WBC: 4.7 10*3/uL (ref 4.0–10.5)
nRBC: 0 % (ref 0.0–0.2)

## 2019-11-07 LAB — COMPREHENSIVE METABOLIC PANEL
ALT: 8 U/L (ref 0–44)
AST: 9 U/L — ABNORMAL LOW (ref 15–41)
Albumin: 3.1 g/dL — ABNORMAL LOW (ref 3.5–5.0)
Alkaline Phosphatase: 66 U/L (ref 38–126)
Anion gap: 11 (ref 5–15)
BUN: 15 mg/dL (ref 8–23)
CO2: 26 mmol/L (ref 22–32)
Calcium: 8.1 mg/dL — ABNORMAL LOW (ref 8.9–10.3)
Chloride: 97 mmol/L — ABNORMAL LOW (ref 98–111)
Creatinine, Ser: 1.37 mg/dL — ABNORMAL HIGH (ref 0.44–1.00)
GFR calc Af Amer: 42 mL/min — ABNORMAL LOW (ref >60)
GFR calc non Af Amer: 37 mL/min — ABNORMAL LOW (ref >60)
Glucose, Bld: 191 mg/dL — ABNORMAL HIGH (ref 70–99)
Potassium: 2.4 mmol/L — CL (ref 3.5–5.1)
Sodium: 134 mmol/L — ABNORMAL LOW (ref 135–145)
Total Bilirubin: 1.1 mg/dL (ref 0.3–1.2)
Total Protein: 6.8 g/dL (ref 6.5–8.1)

## 2019-11-07 MED ORDER — DENOSUMAB 120 MG/1.7ML ~~LOC~~ SOLN
SUBCUTANEOUS | Status: AC
  Filled 2019-11-07: qty 1.7

## 2019-11-07 MED ORDER — POTASSIUM CHLORIDE CRYS ER 20 MEQ PO TBCR
EXTENDED_RELEASE_TABLET | ORAL | Status: AC
  Filled 2019-11-07: qty 2

## 2019-11-07 MED ORDER — EPOETIN ALFA-EPBX 10000 UNIT/ML IJ SOLN
20000.0000 [IU] | Freq: Once | INTRAMUSCULAR | Status: AC
  Administered 2019-11-07: 15:00:00 20000 [IU] via SUBCUTANEOUS
  Filled 2019-11-07: qty 2

## 2019-11-07 MED ORDER — DENOSUMAB 120 MG/1.7ML ~~LOC~~ SOLN
120.0000 mg | Freq: Once | SUBCUTANEOUS | Status: AC
  Administered 2019-11-07: 120 mg via SUBCUTANEOUS

## 2019-11-07 MED ORDER — DEXAMETHASONE 4 MG PO TABS
40.0000 mg | ORAL_TABLET | Freq: Once | ORAL | Status: AC
  Administered 2019-11-07: 40 mg via ORAL
  Filled 2019-11-07: qty 10

## 2019-11-07 MED ORDER — POTASSIUM CHLORIDE CRYS ER 20 MEQ PO TBCR
40.0000 meq | EXTENDED_RELEASE_TABLET | Freq: Once | ORAL | Status: AC
  Administered 2019-11-07: 40 meq via ORAL

## 2019-11-07 MED ORDER — POTASSIUM CHLORIDE IN NACL 20-0.9 MEQ/L-% IV SOLN
Freq: Once | INTRAVENOUS | Status: AC
  Administered 2019-11-07: 15:00:00 via INTRAVENOUS
  Filled 2019-11-07: qty 1000

## 2019-11-07 MED ORDER — BORTEZOMIB CHEMO SQ INJECTION 3.5 MG (2.5MG/ML)
1.3000 mg/m2 | Freq: Once | INTRAMUSCULAR | Status: AC
  Administered 2019-11-07: 2.25 mg via SUBCUTANEOUS
  Filled 2019-11-07: qty 0.9

## 2019-11-07 MED ORDER — PROCHLORPERAZINE MALEATE 10 MG PO TABS
10.0000 mg | ORAL_TABLET | Freq: Once | ORAL | Status: AC
  Administered 2019-11-07: 10 mg via ORAL
  Filled 2019-11-07: qty 1

## 2019-11-07 NOTE — Patient Instructions (Signed)
[facility] Discharge Instructions for Patients Receiving Chemotherapy  Today you received the following chemotherapy agents   To help prevent nausea and vomiting after your treatment, we encourage you to take your nausea medication   If you develop nausea and vomiting that is not controlled by your nausea medication, call the clinic.   BELOW ARE SYMPTOMS THAT SHOULD BE REPORTED IMMEDIATELY:  *FEVER GREATER THAN 100.5 F  *CHILLS WITH OR WITHOUT FEVER  NAUSEA AND VOMITING THAT IS NOT CONTROLLED WITH YOUR NAUSEA MEDICATION  *UNUSUAL SHORTNESS OF BREATH  *UNUSUAL BRUISING OR BLEEDING  TENDERNESS IN MOUTH AND THROAT WITH OR WITHOUT PRESENCE OF ULCERS  *URINARY PROBLEMS  *BOWEL PROBLEMS  UNUSUAL RASH Items with * indicate a potential emergency and should be followed up as soon as possible.  Feel free to call the clinic should you have any questions or concerns. The clinic phone number is [phone].  Please show the CHEMO ALERT CARD at check-in to the Emergency Department and triage nurse.   

## 2019-11-07 NOTE — Progress Notes (Signed)
Pt presents today for tx. Labs pending. Pt states she is taking her Revlimid as prescribed and continues to take her Calcium/Vitamin D medication. MAR reviewed. Vital signs within parameters for treatment today.   Verbal order received to give 71meq PO Potassium and 32meq IV infusion x 2 hours by RNester NP.   Velcade injection given today per MD orders. Tolerated without adverse affects. Vital signs stable. No complaints at this time. Discharged from clinic ambulatory. F/U with [facility] as scheduled.

## 2019-11-07 NOTE — Progress Notes (Signed)
11/07/19  K+ = 2.4 Received order to give 1 liter NS + 20 meq KCl over 2 hours and potassium chloride 40 meq oral x 1   T.O. Reynolds Bowl, NP-C/Luv Mish Ronnald Ramp, PharmD

## 2019-11-07 NOTE — Progress Notes (Signed)
CRITICAL VALUE ALERT Critical value received:  K+ 2.4 Date of notification:  11-07-2019 Time of notification: [id number] Critical value read back:  Yes.   Nurse who received alert:  C. Lashawna Poche RN MD notified :  Reynolds Bowl NP

## 2019-11-14 ENCOUNTER — Inpatient Hospital Stay (HOSPITAL_COMMUNITY): Payer: Medicare Other

## 2019-11-14 ENCOUNTER — Other Ambulatory Visit: Payer: Self-pay

## 2019-11-14 VITALS — BP 124/51 | HR 73 | Temp 97.5°F | Resp 18 | Wt 154.8 lb

## 2019-11-14 DIAGNOSIS — E876 Hypokalemia: Secondary | ICD-10-CM

## 2019-11-14 DIAGNOSIS — E538 Deficiency of other specified B group vitamins: Secondary | ICD-10-CM

## 2019-11-14 DIAGNOSIS — C9000 Multiple myeloma not having achieved remission: Secondary | ICD-10-CM

## 2019-11-14 DIAGNOSIS — Z5112 Encounter for antineoplastic immunotherapy: Secondary | ICD-10-CM | POA: Diagnosis not present

## 2019-11-14 LAB — CBC WITH DIFFERENTIAL/PLATELET
Abs Immature Granulocytes: 0.02 10*3/uL (ref 0.00–0.07)
Basophils Absolute: 0 10*3/uL (ref 0.0–0.1)
Basophils Relative: 0 %
Eosinophils Absolute: 0.1 10*3/uL (ref 0.0–0.5)
Eosinophils Relative: 2 %
HCT: 31 % — ABNORMAL LOW (ref 36.0–46.0)
Hemoglobin: 9.9 g/dL — ABNORMAL LOW (ref 12.0–15.0)
Immature Granulocytes: 0 %
Lymphocytes Relative: 15 %
Lymphs Abs: 0.7 10*3/uL (ref 0.7–4.0)
MCH: 32.9 pg (ref 26.0–34.0)
MCHC: 31.9 g/dL (ref 30.0–36.0)
MCV: 103 fL — ABNORMAL HIGH (ref 80.0–100.0)
Monocytes Absolute: 0.7 10*3/uL (ref 0.1–1.0)
Monocytes Relative: 14 %
Neutro Abs: 3.2 10*3/uL (ref 1.7–7.7)
Neutrophils Relative %: 69 %
Platelets: 223 10*3/uL (ref 150–400)
RBC: 3.01 MIL/uL — ABNORMAL LOW (ref 3.87–5.11)
RDW: 16 % — ABNORMAL HIGH (ref 11.5–15.5)
WBC: 4.7 10*3/uL (ref 4.0–10.5)
nRBC: 0 % (ref 0.0–0.2)

## 2019-11-14 LAB — COMPREHENSIVE METABOLIC PANEL
ALT: 7 U/L (ref 0–44)
AST: 9 U/L — ABNORMAL LOW (ref 15–41)
Albumin: 3.3 g/dL — ABNORMAL LOW (ref 3.5–5.0)
Alkaline Phosphatase: 74 U/L (ref 38–126)
Anion gap: 13 (ref 5–15)
BUN: 16 mg/dL (ref 8–23)
CO2: 25 mmol/L (ref 22–32)
Calcium: 8.8 mg/dL — ABNORMAL LOW (ref 8.9–10.3)
Chloride: 98 mmol/L (ref 98–111)
Creatinine, Ser: 1.08 mg/dL — ABNORMAL HIGH (ref 0.44–1.00)
GFR calc Af Amer: 57 mL/min — ABNORMAL LOW (ref >60)
GFR calc non Af Amer: 49 mL/min — ABNORMAL LOW (ref >60)
Glucose, Bld: 294 mg/dL — ABNORMAL HIGH (ref 70–99)
Potassium: 2.5 mmol/L — CL (ref 3.5–5.1)
Sodium: 136 mmol/L (ref 135–145)
Total Bilirubin: 1.2 mg/dL (ref 0.3–1.2)
Total Protein: 7 g/dL (ref 6.5–8.1)

## 2019-11-14 LAB — LACTATE DEHYDROGENASE: LDH: 127 U/L (ref 98–192)

## 2019-11-14 LAB — IRON AND TIBC
Iron: 17 ug/dL — ABNORMAL LOW (ref 28–170)
Saturation Ratios: 16 % (ref 10.4–31.8)
TIBC: 107 ug/dL — ABNORMAL LOW (ref 250–450)
UIBC: 90 ug/dL

## 2019-11-14 LAB — FERRITIN: Ferritin: 388 ng/mL — ABNORMAL HIGH (ref 11–307)

## 2019-11-14 LAB — FOLATE: Folate: 13.3 ng/mL (ref >5.9)

## 2019-11-14 LAB — VITAMIN B12: Vitamin B-12: 1353 pg/mL — ABNORMAL HIGH (ref 180–914)

## 2019-11-14 MED ORDER — BORTEZOMIB CHEMO SQ INJECTION 3.5 MG (2.5MG/ML)
1.3000 mg/m2 | Freq: Once | INTRAMUSCULAR | Status: AC
  Administered 2019-11-14: 15:00:00 2.25 mg via SUBCUTANEOUS
  Filled 2019-11-14: qty 0.9

## 2019-11-14 MED ORDER — DEXAMETHASONE 4 MG PO TABS
40.0000 mg | ORAL_TABLET | Freq: Once | ORAL | Status: AC
  Administered 2019-11-14: 40 mg via ORAL
  Filled 2019-11-14: qty 10

## 2019-11-14 MED ORDER — PROCHLORPERAZINE MALEATE 10 MG PO TABS
10.0000 mg | ORAL_TABLET | Freq: Once | ORAL | Status: AC
  Administered 2019-11-14: 15:00:00 10 mg via ORAL
  Filled 2019-11-14: qty 1

## 2019-11-14 MED ORDER — POTASSIUM CHLORIDE IN NACL 20-0.9 MEQ/L-% IV SOLN
Freq: Once | INTRAVENOUS | Status: AC
  Administered 2019-11-14: 14:00:00 via INTRAVENOUS
  Filled 2019-11-14: qty 1000

## 2019-11-14 MED ORDER — POTASSIUM CHLORIDE ER 10 MEQ PO CPCR: 20.0000 meq | ORAL_CAPSULE | Freq: Two times a day (BID) | ORAL | 3 refills | Status: DC

## 2019-11-14 MED ORDER — POTASSIUM CHLORIDE CRYS ER 20 MEQ PO TBCR
60.0000 meq | EXTENDED_RELEASE_TABLET | Freq: Once | ORAL | Status: AC
  Administered 2019-11-14: 14:00:00 60 meq via ORAL
  Filled 2019-11-14: qty 3

## 2019-11-14 MED ORDER — EPOETIN ALFA-EPBX 10000 UNIT/ML IJ SOLN
20000.0000 [IU] | Freq: Once | INTRAMUSCULAR | Status: AC
  Administered 2019-11-14: 20000 [IU] via SUBCUTANEOUS
  Filled 2019-11-14: qty 2

## 2019-11-14 NOTE — Progress Notes (Signed)
CRITICAL VALUE ALERT Critical value received:  K+ 2.5 Date of notification:  11-14-2019 Time of notification: [id number] Critical value read back:  Yes.   Nurse who received alert:  Forest Gleason RN MD notified time and response:  Reynolds Bowl NP

## 2019-11-14 NOTE — Patient Instructions (Signed)
[facility]  Discharge Instructions:   _______________________________________________________________  Thank you for choosing [facility] at [facility] to provide your oncology and hematology care.  To afford each patient quality time with our providers, please arrive at least 15 minutes before your scheduled appointment.  You need to re-schedule your appointment if you arrive 10 or more minutes late.  We strive to give you quality time with our providers, and arriving late affects you and other patients whose appointments are after yours.  Also, if you no show three or more times for appointments you may be dismissed from the clinic.  Again, thank you for choosing [facility] at [facility]. Our hope is that these requests will allow you access to exceptional care and in a timely manner. _______________________________________________________________  If you have questions after your visit, please contact our office at [phone] between the hours of 8:30 a.m. and 5:00 p.m. Voicemails left after 4:30 p.m. will not be returned until the following business day. _______________________________________________________________  For prescription refill requests, have your pharmacy contact our office. _______________________________________________________________  Recommendations made by the consultant and any test results will be sent to your referring physician. _______________________________________________________________ 

## 2019-11-14 NOTE — Progress Notes (Signed)
11/14/19  Potassium 2.5 today  Received order to give NS 1 liter + 20 meq potassium chloride over 1 hour x 1 today.  Order entered to reflect above.  T.OReynolds Bowl, NP-C/Margaree Sandhu Ronnald Ramp, PharmD

## 2019-11-14 NOTE — Progress Notes (Signed)
Pt presents today for treatment and Retacrit. Vital signs within parameters for treatment. MAR reviewed and updated. Pt has no complaints of any pain or changes since the last visit.   Potassium 2.5. VO received from RNester NP to infuse 47meq of potassium in 1 liter of NS over 1 hour.   Velcade  administered per MD orders. Tolerated without adverse affects. Vital signs stable. No complaints at this time. Discharged from clinic ambulatory. F/U with [facility] as scheduled.

## 2019-11-15 ENCOUNTER — Other Ambulatory Visit (HOSPITAL_COMMUNITY): Payer: Self-pay | Admitting: *Deleted

## 2019-11-15 DIAGNOSIS — C9000 Multiple myeloma not having achieved remission: Secondary | ICD-10-CM

## 2019-11-15 LAB — PROTEIN ELECTROPHORESIS, SERUM
A/G Ratio: 0.8 (ref 0.7–1.7)
Albumin ELP: 2.9 g/dL (ref 2.9–4.4)
Alpha-1-Globulin: 0.3 g/dL (ref 0.0–0.4)
Alpha-2-Globulin: 1.1 g/dL — ABNORMAL HIGH (ref 0.4–1.0)
Beta Globulin: 1.2 g/dL (ref 0.7–1.3)
Gamma Globulin: 1 g/dL (ref 0.4–1.8)
Globulin, Total: 3.6 g/dL (ref 2.2–3.9)
M-Spike, %: 0.6 g/dL — ABNORMAL HIGH
Total Protein ELP: 6.5 g/dL (ref 6.0–8.5)

## 2019-11-15 LAB — KAPPA/LAMBDA LIGHT CHAINS
Kappa free light chain: 31.1 mg/L — ABNORMAL HIGH (ref 3.3–19.4)
Kappa, lambda light chain ratio: 1.38 (ref 0.26–1.65)
Lambda free light chains: 22.6 mg/L (ref 5.7–26.3)

## 2019-11-15 MED ORDER — LENALIDOMIDE 20 MG PO CAPS: ORAL_CAPSULE | ORAL | 0 refills | Status: DC

## 2019-11-27 ENCOUNTER — Other Ambulatory Visit: Payer: Self-pay

## 2019-11-28 ENCOUNTER — Ambulatory Visit (HOSPITAL_COMMUNITY): Payer: Medicare Other | Admitting: Hematology

## 2019-11-28 ENCOUNTER — Ambulatory Visit (HOSPITAL_COMMUNITY): Payer: Medicare Other

## 2019-11-28 ENCOUNTER — Other Ambulatory Visit (HOSPITAL_COMMUNITY): Payer: Medicare Other

## 2019-11-28 ENCOUNTER — Inpatient Hospital Stay (HOSPITAL_COMMUNITY): Payer: Medicare Other

## 2019-11-29 ENCOUNTER — Other Ambulatory Visit: Payer: Self-pay

## 2019-11-29 ENCOUNTER — Inpatient Hospital Stay (HOSPITAL_BASED_OUTPATIENT_CLINIC_OR_DEPARTMENT_OTHER): Payer: Medicare Other | Admitting: Hematology

## 2019-11-29 ENCOUNTER — Inpatient Hospital Stay (HOSPITAL_COMMUNITY): Payer: Medicare Other | Attending: Hematology

## 2019-11-29 ENCOUNTER — Encounter (HOSPITAL_COMMUNITY): Payer: Self-pay | Admitting: Hematology

## 2019-11-29 ENCOUNTER — Inpatient Hospital Stay (HOSPITAL_COMMUNITY): Payer: Medicare Other

## 2019-11-29 VITALS — BP 131/57 | HR 73 | Temp 97.5°F | Resp 20 | Wt 154.8 lb

## 2019-11-29 DIAGNOSIS — Z5112 Encounter for antineoplastic immunotherapy: Secondary | ICD-10-CM | POA: Insufficient documentation

## 2019-11-29 DIAGNOSIS — D649 Anemia, unspecified: Secondary | ICD-10-CM | POA: Insufficient documentation

## 2019-11-29 DIAGNOSIS — C9000 Multiple myeloma not having achieved remission: Secondary | ICD-10-CM

## 2019-11-29 DIAGNOSIS — E538 Deficiency of other specified B group vitamins: Secondary | ICD-10-CM

## 2019-11-29 LAB — COMPREHENSIVE METABOLIC PANEL
ALT: 7 U/L (ref 0–44)
AST: 8 U/L — ABNORMAL LOW (ref 15–41)
Albumin: 3 g/dL — ABNORMAL LOW (ref 3.5–5.0)
Alkaline Phosphatase: 65 U/L (ref 38–126)
Anion gap: 12 (ref 5–15)
BUN: 13 mg/dL (ref 8–23)
CO2: 27 mmol/L (ref 22–32)
Calcium: 8.3 mg/dL — ABNORMAL LOW (ref 8.9–10.3)
Chloride: 101 mmol/L (ref 98–111)
Creatinine, Ser: 0.9 mg/dL (ref 0.44–1.00)
GFR calc Af Amer: >60 mL/min (ref >60)
GFR calc non Af Amer: >60 mL/min (ref >60)
Glucose, Bld: 87 mg/dL (ref 70–99)
Potassium: 2.9 mmol/L — ABNORMAL LOW (ref 3.5–5.1)
Sodium: 140 mmol/L (ref 135–145)
Total Bilirubin: 0.7 mg/dL (ref 0.3–1.2)
Total Protein: 6.8 g/dL (ref 6.5–8.1)

## 2019-11-29 LAB — CBC WITH DIFFERENTIAL/PLATELET
Abs Immature Granulocytes: 0.02 10*3/uL (ref 0.00–0.07)
Basophils Absolute: 0.1 10*3/uL (ref 0.0–0.1)
Basophils Relative: 1 %
Eosinophils Absolute: 0.3 10*3/uL (ref 0.0–0.5)
Eosinophils Relative: 6 %
HCT: 30.1 % — ABNORMAL LOW (ref 36.0–46.0)
Hemoglobin: 9.5 g/dL — ABNORMAL LOW (ref 12.0–15.0)
Immature Granulocytes: 0 %
Lymphocytes Relative: 12 %
Lymphs Abs: 0.6 10*3/uL — ABNORMAL LOW (ref 0.7–4.0)
MCH: 32.6 pg (ref 26.0–34.0)
MCHC: 31.6 g/dL (ref 30.0–36.0)
MCV: 103.4 fL — ABNORMAL HIGH (ref 80.0–100.0)
Monocytes Absolute: 0.3 10*3/uL (ref 0.1–1.0)
Monocytes Relative: 6 %
Neutro Abs: 3.6 10*3/uL (ref 1.7–7.7)
Neutrophils Relative %: 75 %
Platelets: 217 10*3/uL (ref 150–400)
RBC: 2.91 MIL/uL — ABNORMAL LOW (ref 3.87–5.11)
RDW: 16 % — ABNORMAL HIGH (ref 11.5–15.5)
WBC: 4.9 10*3/uL (ref 4.0–10.5)
nRBC: 0 % (ref 0.0–0.2)

## 2019-11-29 MED ORDER — DEXAMETHASONE 4 MG PO TABS
40.0000 mg | ORAL_TABLET | Freq: Once | ORAL | Status: AC
  Administered 2019-11-29: 40 mg via ORAL
  Filled 2019-11-29: qty 10

## 2019-11-29 MED ORDER — PROCHLORPERAZINE MALEATE 10 MG PO TABS
10.0000 mg | ORAL_TABLET | Freq: Once | ORAL | Status: AC
  Administered 2019-11-29: 10 mg via ORAL
  Filled 2019-11-29: qty 1

## 2019-11-29 MED ORDER — CYANOCOBALAMIN 1000 MCG/ML IJ SOLN
1000.0000 ug | Freq: Once | INTRAMUSCULAR | Status: AC
  Administered 2019-11-29: 1000 ug via INTRAMUSCULAR
  Filled 2019-11-29: qty 1

## 2019-11-29 MED ORDER — DENOSUMAB 120 MG/1.7ML ~~LOC~~ SOLN: 120.0000 mg | Freq: Once | SUBCUTANEOUS | Status: DC

## 2019-11-29 MED ORDER — EPOETIN ALFA-EPBX 10000 UNIT/ML IJ SOLN
20000.0000 [IU] | Freq: Once | INTRAMUSCULAR | Status: AC
  Administered 2019-11-29: 20000 [IU] via SUBCUTANEOUS
  Filled 2019-11-29: qty 2

## 2019-11-29 MED ORDER — POTASSIUM CHLORIDE CRYS ER 20 MEQ PO TBCR
40.0000 meq | EXTENDED_RELEASE_TABLET | Freq: Once | ORAL | Status: AC
  Administered 2019-11-29: 40 meq via ORAL
  Filled 2019-11-29: qty 2

## 2019-11-29 MED ORDER — BORTEZOMIB CHEMO SQ INJECTION 3.5 MG (2.5MG/ML)
1.3000 mg/m2 | Freq: Once | INTRAMUSCULAR | Status: AC
  Administered 2019-11-29: 2.25 mg via SUBCUTANEOUS
  Filled 2019-11-29: qty 0.9

## 2019-11-29 NOTE — Progress Notes (Signed)
Brittany Archer, [place] [address]   CLINIC:  Medical Oncology/Hematology  PCP:  Vesta Mixer [address] [place] [address] [phone]   REASON FOR VISIT:  Follow-up for IgA Kappa Multiple Myeloma   CURRENT THERAPY: RVD  BRIEF ONCOLOGIC HISTORY:  Oncology History  Multiple myeloma not having achieved remission ([place])  01/20/2018 Initial Diagnosis   Multiple myeloma not having achieved remission ([place])   01/26/2018 -  Chemotherapy   The patient had bortezomib SQ (VELCADE) chemo injection 2.5 mg, 1.3 mg/m2 = 2.5 mg, Subcutaneous,  Once, 25 of 26 cycles Administration: 2.5 mg (01/26/2018), 2.5 mg (02/02/2018), 2.5 mg (02/09/2018), 2.5 mg (02/16/2018), 2.5 mg (02/23/2018), 2.5 mg (03/02/2018), 2.5 mg (03/09/2018), 2.5 mg (03/30/2018), 2.5 mg (04/06/2018), 2.5 mg (04/13/2018), 2.5 mg (04/21/2018), 2.5 mg (05/06/2018), 2.5 mg (05/11/2018), 2.5 mg (05/20/2018), 2.5 mg (05/27/2018), 2.5 mg (06/10/2018), 2.5 mg (06/17/2018), 2.5 mg (06/24/2018), 2.5 mg (07/08/2018), 2.5 mg (07/15/2018), 2.5 mg (07/22/2018), 2.5 mg (08/05/2018), 2.5 mg (08/12/2018), 2.5 mg (09/02/2018), 2.5 mg (09/09/2018), 2.5 mg (09/16/2018), 2.5 mg (09/30/2018), 2.5 mg (10/07/2018), 2.5 mg (10/14/2018), 2.5 mg (10/31/2018), 2.5 mg (11/07/2018), 2.5 mg (11/14/2018), 2.5 mg (11/28/2018), 2.25 mg (12/05/2018), 2.25 mg (12/12/2018), 2.25 mg (12/26/2018), 2.25 mg (01/02/2019), 2.25 mg (01/09/2019), 2.25 mg (01/23/2019), 2.25 mg (01/30/2019), 2.25 mg (02/06/2019), 2.25 mg (02/20/2019), 2.25 mg (02/27/2019), 2.25 mg (03/06/2019), 2.25 mg (03/20/2019), 2.25 mg (03/27/2019), 2.25 mg (04/03/2019), 2.25 mg (04/17/2019), 2.25 mg (04/24/2019), 2.25 mg (05/01/2019), 2.25 mg (05/15/2019), 2.25 mg (05/23/2019), 2.25 mg (05/30/2019), 2.25 mg (06/13/2019), 2.25 mg (06/20/2019), 2.25 mg (06/27/2019), 2.25 mg (07/11/2019), 2.25 mg (07/18/2019), 2.25 mg (07/25/2019), 2.25 mg (08/08/2019), 2.25 mg (08/15/2019), 2.25 mg (08/22/2019), 2.25 mg (09/05/2019), 2.25 mg  (09/12/2019), 2.25 mg (09/19/2019), 2.25 mg (10/03/2019), 2.25 mg (10/10/2019), 2.25 mg (10/17/2019), 2.25 mg (10/31/2019), 2.25 mg (11/07/2019), 2.25 mg (11/14/2019), 2.25 mg (11/29/2019)  for chemotherapy treatment.         INTERVAL HISTORY:  Brittany Archer 78 y.o. female seen for toxicity assessment and next treatment for multiple myeloma.  She reports she has been taking potassium daily but does not remember if she has taken it today.  She is also taking Revlimid without missing doses.  Appetite and energy levels are 75%.  Numbness in the right big toe which is chronic and stable.  No new numbness noted.  No fevers or chills noted.  No new pains.  She is tolerating Retacrit very well.   REVIEW OF SYSTEMS:  Review of Systems  All other systems reviewed and are negative.    PAST MEDICAL/SURGICAL HISTORY:  Past Medical History:  Diagnosis Date   Breast cancer ([place])    left breast/ 2008/ surg/ rad tx   Coronary artery disease    Diabetes mellitus    Past Surgical History:  Procedure Laterality Date   ABDOMINAL HYSTERECTOMY     BREAST SURGERY       SOCIAL HISTORY:  Social History   Socioeconomic History   Marital status: Divorced    Spouse name: Not on file   Number of children: Not on file   Years of education: Not on file   Highest education level: Not on file  Occupational History   Not on file  Social Needs   Financial resource strain: Not on file   Food insecurity    Worry: Not on file    Inability: Not on file   Transportation needs    Medical: Not on file  Non-medical: Not on file  Tobacco Use   Smoking status: Never Smoker   Smokeless tobacco: Never Used  Substance and Sexual Activity   Alcohol use: No   Drug use: No   Sexual activity: Yes    Birth control/protection: Surgical  Lifestyle   Physical activity    Days per week: Not on file    Minutes per session: Not on file   Stress: Not on file  Relationships   Social connections     Talks on phone: Not on file    Gets together: Not on file    Attends religious service: Not on file    Active member of club or organization: Not on file    Attends meetings of clubs or organizations: Not on file    Relationship status: Not on file   Intimate partner violence    Fear of current or ex partner: Not on file    Emotionally abused: Not on file    Physically abused: Not on file    Forced sexual activity: Not on file  Other Topics Concern   Not on file  Social History Narrative   Not on file    FAMILY HISTORY:  Family History  Problem Relation Age of Onset   Obesity Sister     CURRENT MEDICATIONS:  Outpatient Encounter Medications as of 11/29/2019  Medication Sig   acyclovir (ZOVIRAX) 400 MG tablet Take 1 tablet (400 mg total) by mouth 2 (two) times daily.   aspirin 81 MG tablet Take 81 mg by mouth daily.     bortezomib IV (VELCADE) 3.5 MG injection Inject into the vein once. weekly   cholecalciferol (VITAMIN D) 1000 units tablet Take 1,000 Units by mouth daily.   Denosumab (XGEVA [place]) Inject into the skin. Every 28 days   dexamethasone (DECADRON) 4 MG tablet Take 10 tablets (40 mg) on days 1, 8, and 15 of chemo. Repeat every 21 days.   glipiZIDE (GLUCOTROL) 5 MG tablet Take 5 mg by mouth daily before breakfast.    lenalidomide (REVLIMID) 20 MG capsule Take 1 capsule by mouth once daily for 14 days on, and 7 days off of a 21 day cycle.   lisinopril-hydrochlorothiazide (PRINZIDE,ZESTORETIC) 20-25 MG tablet Take 1 tablet by mouth daily.    metFORMIN (GLUCOPHAGE) 1000 MG tablet Take 1,000 mg by mouth 2 times daily at 12 noon and 4 pm.     potassium chloride (MICRO-K) 10 MEQ CR capsule Take 2 capsules (20 mEq total) by mouth 2 (two) times daily.   acetaminophen (TYLENOL) 500 MG tablet Take 500 mg by mouth every 6 (six) hours as needed for mild pain or moderate pain.   ondansetron (ZOFRAN) 8 MG tablet Take 1 tablet (8 mg total) by mouth 2 (two) times  daily as needed (Nausea or vomiting). (Patient not taking: Reported on 11/29/2019)   prochlorperazine (COMPAZINE) 10 MG tablet Take 1 tablet (10 mg total) by mouth every 6 (six) hours as needed (Nausea or vomiting). (Patient not taking: Reported on 11/29/2019)   [DISCONTINUED] potassium chloride SA (K-DUR) 20 MEQ tablet TAKE (1) TABLET BY MOUTH THREE TIMES DAILY   No facility-administered encounter medications on file as of 11/29/2019.     ALLERGIES:  Allergies  Allergen Reactions   Motrin [Ibuprofen] Rash     PHYSICAL EXAM:  ECOG Performance status: 1  Vitals:   11/29/19 0928  BP: (!) 131/57  Pulse: 73  Resp: 20  Temp: (!) 97.5 F (36.4 C)  SpO2: 99%  Filed Weights   11/29/19 0928  Weight: 154 lb 12.8 oz (70.2 kg)    Physical Exam Vitals signs reviewed.  Constitutional:      Appearance: Normal appearance. She is obese.  HENT:     Head: Normocephalic.     Nose: Nose normal.     Mouth/Throat:     Mouth: Mucous membranes are moist.     Pharynx: Oropharynx is clear.  Eyes:     Extraocular Movements: Extraocular movements intact.     Conjunctiva/sclera: Conjunctivae normal.  Neck:     Musculoskeletal: Normal range of motion.  Cardiovascular:     Rate and Rhythm: Normal rate and regular rhythm.     Pulses: Normal pulses.     Heart sounds: Normal heart sounds.  Pulmonary:     Effort: Pulmonary effort is normal.     Breath sounds: Normal breath sounds.  Abdominal:     General: Bowel sounds are normal.     Palpations: Abdomen is soft.  Musculoskeletal: Normal range of motion.  Skin:    General: Skin is warm and dry.  Neurological:     General: No focal deficit present.     Mental Status: She is alert and oriented to person, place, and time. Mental status is at baseline.  Psychiatric:        Mood and Affect: Mood normal.        Behavior: Behavior normal.        Thought Content: Thought content normal.        Judgment: Judgment normal.      LABORATORY  DATA:  I have reviewed the labs as listed.  CBC    Component Value Date/Time   WBC 4.9 11/29/2019 0907   RBC 2.91 (L) 11/29/2019 0907   HGB 9.5 (L) 11/29/2019 0907   HCT 30.1 (L) 11/29/2019 0907   PLT 217 11/29/2019 0907   MCV 103.4 (H) 11/29/2019 0907   MCH 32.6 11/29/2019 0907   MCHC 31.6 11/29/2019 0907   RDW 16.0 (H) 11/29/2019 0907   LYMPHSABS 0.6 (L) 11/29/2019 0907   MONOABS 0.3 11/29/2019 0907   EOSABS 0.3 11/29/2019 0907   BASOSABS 0.1 11/29/2019 0907   CMP Latest Ref Rng & Units 11/29/2019 11/14/2019 11/07/2019  Glucose 70 - 99 mg/dL 87 294(H) 191(H)  BUN 8 - 23 mg/dL '13 16 15  ' Creatinine 0.44 - 1.00 mg/dL 0.90 1.08(H) 1.37(H)  Sodium 135 - 145 mmol/L 140 136 134(L)  Potassium 3.5 - 5.1 mmol/L 2.9(L) 2.5(LL) 2.4(LL)  Chloride 98 - 111 mmol/L 101 98 97(L)  CO2 22 - 32 mmol/L '27 25 26  ' Calcium 8.9 - 10.3 mg/dL 8.3(L) 8.8(L) 8.1(L)  Total Protein 6.5 - 8.1 g/dL 6.8 7.0 6.8  Total Bilirubin 0.3 - 1.2 mg/dL 0.7 1.2 1.1  Alkaline Phos 38 - 126 U/L 65 74 66  AST 15 - 41 U/L 8(L) 9(L) 9(L)  ALT 0 - 44 U/L '7 7 8      ' I have independently reviewed her scans and discussed with the patient.   ASSESSMENT & PLAN:   Multiple myeloma not having achieved remission ([place]) 1.  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  Beta-2 MG of 3.2, LDH normal.  SPEP with 0.9 g/dL, free light chain ratio of 114, kappa light chains of 7096. - Skeletal survey on  02/16/2018 shows subtle patchy areas of osteopenia of the thoracolumbar spine and possibly distal right clavicle which may reflect subtle changes of multiple myeloma. - RVD started on 01/26/2018. -We reviewed myeloma panel from 11/14/2019.  M spike has increased to 0.6 from 0.4 on 10/17/2019.  Kappa light chains are stable at 31.1, previously 35.2.  Ratio is 1.38,  improved from 1.66 previously. -She will proceed with her Velcade injection.  She does not have any signs of neuropathy.  She will also continue Revlimid 20 mg 2 weeks on 1 week off.  She will continue dexamethasone 40 mg weekly. -She had a mixed response with increased M spike and improved light chain ratio.  I will recheck her labs at next visit in 4 weeks.  If the M spike continues to trend up, we will consider changing to a daratumumab-based regimen.  Due to poor compliance, we will also consider avoiding oral therapy for myeloma.  2.  Bone strengthening: -She will continue monthly denosumab.  She will continue calcium and vitamin D.  3.  Hypokalemia: -Potassium is severely low at 2.9 today.  She was reinforced to take potassium 23 times a day. -Today we will give her IV and p.o. potassium.  4.  Normocytic anemia: -Retacrit 20,000 units started on 09/05/2019.  Ferritin was 388 and percent saturation was 16 on 11/14/2019. -We will check her iron periodically and consider parenteral iron therapy.   Total time spent is 25 minutes with more than 50% of the time spent face-to-face discussing lab results, treatment plan, counseling and coordination of care.  Orders placed this encounter:  Orders Placed This Encounter  Procedures   CBC with Differential/Platelet   Comprehensive metabolic panel   Protein electrophoresis, serum   Kappa/lambda light chains   Lactate dehydrogenase   Iron and TIBC   Ferritin   Vitamin B12   Folate      Derek Jack, MD  [place] [phone]

## 2019-11-29 NOTE — Progress Notes (Signed)
Patient tolerated injection with no complaints voiced.  Site clean and dry with no bruising or swelling noted at site.  Band aid applied.  Vss with discharge and left ambulatory with no s/s of distress noted.  

## 2019-11-29 NOTE — Patient Instructions (Addendum)
[place] at [facility] Discharge Instructions  You were seen today by Dr. Delton Coombes. He went over your recent lab results. Continue weekly labs and injections. He will see you back in 5 weeks for labs and follow up.   Thank you for choosing [place] at [facility] to provide your oncology and hematology care.  To afford each patient quality time with our provider, please arrive at least 15 minutes before your scheduled appointment time.   If you have a lab appointment with the [facility] please come in thru the  Main Entrance and check in at the main information desk  You need to re-schedule your appointment should you arrive 10 or more minutes late.  We strive to give you quality time with our providers, and arriving late affects you and other patients whose appointments are after yours.  Also, if you no show three or more times for appointments you may be dismissed from the clinic at the providers discretion.     Again, thank you for choosing [facility].  Our hope is that these requests will decrease the amount of time that you wait before being seen by our physicians.       _____________________________________________________________  Should you have questions after your visit to [facility], please contact our office at [phone] between the hours of 8:00 a.m. and 4:30 p.m.  Voicemails left after 4:00 p.m. will not be returned until the following business day.  For prescription refill requests, have your pharmacy contact our office and allow 72 hours.    Cancer Center Support Programs:   > Cancer Support Group  2nd Tuesday of the month 1pm-2pm, Journey Room

## 2019-12-03 ENCOUNTER — Encounter (HOSPITAL_COMMUNITY): Payer: Self-pay | Admitting: Hematology

## 2019-12-03 NOTE — Assessment & Plan Note (Signed)
1.  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  Beta-2 MG of 3.2, LDH normal.  SPEP with 0.9 g/dL, free light chain ratio of 114, kappa light chains of 7096. - Skeletal survey on 02/16/2018 shows subtle patchy areas of osteopenia of the thoracolumbar spine and possibly distal right clavicle which may reflect subtle changes of multiple myeloma. - RVD started on 01/26/2018. -We reviewed myeloma panel from 11/14/2019.  M spike has increased to 0.6 from 0.4 on 10/17/2019.  Kappa light chains are stable at 31.1, previously 35.2.  Ratio is 1.38, improved from 1.66 previously. -She will proceed with her Velcade injection.  She does not have any signs of neuropathy.  She will also continue Revlimid 20 mg 2 weeks on 1 week off.  She will continue dexamethasone 40 mg weekly. -She had a mixed response with increased M spike and improved light chain ratio.  I will recheck her labs at next visit in 4 weeks.  If the M spike continues to trend up, we will consider changing to a daratumumab-based regimen.  Due to poor compliance, we will also consider avoiding oral therapy for myeloma.  2.  Bone strengthening: -She will continue monthly denosumab.  She will continue calcium and vitamin D.  3.  Hypokalemia: -Potassium is severely low at 2.9 today.  She was reinforced to take potassium 23 times a day. -Today we will give her IV and p.o. potassium.  4.  Normocytic anemia: -Retacrit 20,000 units started on 09/05/2019.  Ferritin was 388 and percent saturation was 16 on 11/14/2019. -We will check her iron periodically and consider parenteral iron therapy.

## 2019-12-04 ENCOUNTER — Other Ambulatory Visit (HOSPITAL_COMMUNITY): Payer: Self-pay | Admitting: *Deleted

## 2019-12-04 DIAGNOSIS — C9000 Multiple myeloma not having achieved remission: Secondary | ICD-10-CM

## 2019-12-04 MED ORDER — LENALIDOMIDE 20 MG PO CAPS: ORAL_CAPSULE | ORAL | 0 refills | Status: DC

## 2019-12-05 ENCOUNTER — Ambulatory Visit (HOSPITAL_COMMUNITY): Payer: Medicare Other

## 2019-12-05 ENCOUNTER — Other Ambulatory Visit (HOSPITAL_COMMUNITY): Payer: Medicare Other

## 2019-12-06 ENCOUNTER — Encounter (HOSPITAL_COMMUNITY): Payer: Self-pay

## 2019-12-06 ENCOUNTER — Inpatient Hospital Stay (HOSPITAL_COMMUNITY): Payer: Medicare Other

## 2019-12-06 ENCOUNTER — Other Ambulatory Visit (HOSPITAL_COMMUNITY): Payer: Medicare Other

## 2019-12-06 ENCOUNTER — Ambulatory Visit (HOSPITAL_COMMUNITY): Payer: Medicare Other

## 2019-12-06 ENCOUNTER — Other Ambulatory Visit: Payer: Self-pay

## 2019-12-06 VITALS — BP 132/68 | HR 74 | Temp 97.2°F | Resp 18

## 2019-12-06 DIAGNOSIS — C9000 Multiple myeloma not having achieved remission: Secondary | ICD-10-CM

## 2019-12-06 DIAGNOSIS — Z5112 Encounter for antineoplastic immunotherapy: Secondary | ICD-10-CM | POA: Diagnosis not present

## 2019-12-06 DIAGNOSIS — E538 Deficiency of other specified B group vitamins: Secondary | ICD-10-CM

## 2019-12-06 LAB — CBC WITH DIFFERENTIAL/PLATELET
Abs Immature Granulocytes: 0.01 10*3/uL (ref 0.00–0.07)
Basophils Absolute: 0 10*3/uL (ref 0.0–0.1)
Basophils Relative: 0 %
Eosinophils Absolute: 0.2 10*3/uL (ref 0.0–0.5)
Eosinophils Relative: 3 %
HCT: 35.3 % — ABNORMAL LOW (ref 36.0–46.0)
Hemoglobin: 10.7 g/dL — ABNORMAL LOW (ref 12.0–15.0)
Immature Granulocytes: 0 %
Lymphocytes Relative: 13 %
Lymphs Abs: 0.8 10*3/uL (ref 0.7–4.0)
MCH: 31.8 pg (ref 26.0–34.0)
MCHC: 30.3 g/dL (ref 30.0–36.0)
MCV: 105.1 fL — ABNORMAL HIGH (ref 80.0–100.0)
Monocytes Absolute: 0.5 10*3/uL (ref 0.1–1.0)
Monocytes Relative: 8 %
Neutro Abs: 4.5 10*3/uL (ref 1.7–7.7)
Neutrophils Relative %: 76 %
Platelets: 192 10*3/uL (ref 150–400)
RBC: 3.36 MIL/uL — ABNORMAL LOW (ref 3.87–5.11)
RDW: 16.5 % — ABNORMAL HIGH (ref 11.5–15.5)
WBC: 6 10*3/uL (ref 4.0–10.5)
nRBC: 0 % (ref 0.0–0.2)

## 2019-12-06 LAB — COMPREHENSIVE METABOLIC PANEL
ALT: 9 U/L (ref 0–44)
AST: 11 U/L — ABNORMAL LOW (ref 15–41)
Albumin: 3.3 g/dL — ABNORMAL LOW (ref 3.5–5.0)
Alkaline Phosphatase: 69 U/L (ref 38–126)
Anion gap: 15 (ref 5–15)
BUN: 12 mg/dL (ref 8–23)
CO2: 29 mmol/L (ref 22–32)
Calcium: 9.2 mg/dL (ref 8.9–10.3)
Chloride: 97 mmol/L — ABNORMAL LOW (ref 98–111)
Creatinine, Ser: 0.95 mg/dL (ref 0.44–1.00)
GFR calc Af Amer: >60 mL/min (ref >60)
GFR calc non Af Amer: 57 mL/min — ABNORMAL LOW (ref >60)
Glucose, Bld: 178 mg/dL — ABNORMAL HIGH (ref 70–99)
Potassium: 2.9 mmol/L — ABNORMAL LOW (ref 3.5–5.1)
Sodium: 141 mmol/L (ref 135–145)
Total Bilirubin: 0.5 mg/dL (ref 0.3–1.2)
Total Protein: 7.1 g/dL (ref 6.5–8.1)

## 2019-12-06 MED ORDER — PROCHLORPERAZINE MALEATE 10 MG PO TABS
10.0000 mg | ORAL_TABLET | Freq: Once | ORAL | Status: AC
  Administered 2019-12-06: 10 mg via ORAL
  Filled 2019-12-06: qty 1

## 2019-12-06 MED ORDER — DENOSUMAB 120 MG/1.7ML ~~LOC~~ SOLN
120.0000 mg | Freq: Once | SUBCUTANEOUS | Status: AC
  Administered 2019-12-06: 120 mg via SUBCUTANEOUS
  Filled 2019-12-06: qty 1.7

## 2019-12-06 MED ORDER — BORTEZOMIB CHEMO SQ INJECTION 3.5 MG (2.5MG/ML)
1.3000 mg/m2 | Freq: Once | INTRAMUSCULAR | Status: AC
  Administered 2019-12-06: 2.25 mg via SUBCUTANEOUS
  Filled 2019-12-06: qty 0.9

## 2019-12-06 MED ORDER — POTASSIUM CHLORIDE CRYS ER 20 MEQ PO TBCR
40.0000 meq | EXTENDED_RELEASE_TABLET | Freq: Once | ORAL | Status: AC
  Administered 2019-12-06: 40 meq via ORAL
  Filled 2019-12-06: qty 2

## 2019-12-06 MED ORDER — DEXAMETHASONE 4 MG PO TABS
40.0000 mg | ORAL_TABLET | Freq: Once | ORAL | Status: AC
  Administered 2019-12-06: 40 mg via ORAL
  Filled 2019-12-06: qty 10

## 2019-12-06 MED ORDER — EPOETIN ALFA-EPBX 10000 UNIT/ML IJ SOLN
20000.0000 [IU] | Freq: Once | INTRAMUSCULAR | Status: AC
  Administered 2019-12-06: 20000 [IU] via SUBCUTANEOUS
  Filled 2019-12-06: qty 2

## 2019-12-06 NOTE — Progress Notes (Signed)
0955 Labs reviewed with Dr. Delton Coombes and pt approved for Velcade injection today with Potassium 40 meq PO added for K+of 2.9 per MD                                                                               Brittany Archer tolerated Velcade,Xgeva and Retacrit injections well without complaints or incident. Calcium 9.2 today and pt denied any tooth or jaw pain and no recent or future dental visits prior to receiving Xgeva injection. Hgb 10.7 today. Pt continues to take her Calcium and Potassium pills as prescribed per pt. VSS Pt discharged self ambulatory accompanied by NT

## 2019-12-06 NOTE — Patient Instructions (Addendum)
[facility] Discharge Instructions for Patients Receiving Chemotherapy   Beginning January 23rd 2017 lab work for the [facility] will be done in the  Main lab at [facility] on 1st floor. If you have a lab appointment with the [place] please come in thru the  Main Entrance and check in at the main information desk   Today you received the following chemotherapy agents Velcade injection as well as Retacrit and Xgeva injections. Follow-up as scheduled. Call clinic for any questions or concerns  To help prevent nausea and vomiting after your treatment, we encourage you to take your nausea medication   If you develop nausea and vomiting, or diarrhea that is not controlled by your medication, call the clinic.  The clinic phone number is [phone]. Office hours are Monday-Friday 8:30am-5:00pm.  BELOW ARE SYMPTOMS THAT SHOULD BE REPORTED IMMEDIATELY:  *FEVER GREATER THAN 101.0 F  *CHILLS WITH OR WITHOUT FEVER  NAUSEA AND VOMITING THAT IS NOT CONTROLLED WITH YOUR NAUSEA MEDICATION  *UNUSUAL SHORTNESS OF BREATH  *UNUSUAL BRUISING OR BLEEDING  TENDERNESS IN MOUTH AND THROAT WITH OR WITHOUT PRESENCE OF ULCERS  *URINARY PROBLEMS  *BOWEL PROBLEMS  UNUSUAL RASH Items with * indicate a potential emergency and should be followed up as soon as possible. If you have an emergency after office hours please contact your primary care physician or go to the nearest emergency department.  Please call the clinic during office hours if you have any questions or concerns.   You may also contact the Patient Navigator at [phone] should you have any questions or need assistance in obtaining follow up care.      Resources For Cancer Patients and their Caregivers ? American Cancer Society: Can assist with transportation, wigs, general needs, runs Look Good Feel Better.        203-623-1313 ? Cancer Care: Provides financial assistance, online support groups,  medication/co-pay assistance.  1-800-813-HOPE 480-434-6099) ? [facility] Assists [place] cancer patients and their families through emotional , educational and financial support.  (260)772-7516 ? [place] DSS Where to apply for food stamps, Medicaid and utility assistance. 252 716 0017 ? [facility]: Transportation to medical appointments. 574-871-4841 ? Social Security Administration: May apply for disability if have a Stage IV cancer. (916)244-7910 (609)219-5517 ? [employer], Disability and Transit Services: Assists with nutrition, care and transit needs. (601)092-3420

## 2019-12-12 ENCOUNTER — Ambulatory Visit (HOSPITAL_COMMUNITY): Payer: Medicare Other

## 2019-12-12 ENCOUNTER — Other Ambulatory Visit (HOSPITAL_COMMUNITY): Payer: Medicare Other

## 2019-12-13 ENCOUNTER — Inpatient Hospital Stay (HOSPITAL_COMMUNITY): Payer: Medicare Other

## 2019-12-13 ENCOUNTER — Other Ambulatory Visit: Payer: Self-pay

## 2019-12-13 ENCOUNTER — Encounter (HOSPITAL_COMMUNITY): Payer: Self-pay

## 2019-12-13 VITALS — BP 128/71 | HR 76 | Temp 97.1°F | Resp 18

## 2019-12-13 DIAGNOSIS — C9000 Multiple myeloma not having achieved remission: Secondary | ICD-10-CM

## 2019-12-13 DIAGNOSIS — E538 Deficiency of other specified B group vitamins: Secondary | ICD-10-CM

## 2019-12-13 DIAGNOSIS — Z5112 Encounter for antineoplastic immunotherapy: Secondary | ICD-10-CM | POA: Diagnosis not present

## 2019-12-13 LAB — CBC WITH DIFFERENTIAL/PLATELET
Abs Immature Granulocytes: 0.02 10*3/uL (ref 0.00–0.07)
Basophils Absolute: 0 10*3/uL (ref 0.0–0.1)
Basophils Relative: 0 %
Eosinophils Absolute: 0.3 10*3/uL (ref 0.0–0.5)
Eosinophils Relative: 7 %
HCT: 32.6 % — ABNORMAL LOW (ref 36.0–46.0)
Hemoglobin: 10.1 g/dL — ABNORMAL LOW (ref 12.0–15.0)
Immature Granulocytes: 0 %
Lymphocytes Relative: 13 %
Lymphs Abs: 0.6 10*3/uL — ABNORMAL LOW (ref 0.7–4.0)
MCH: 32.3 pg (ref 26.0–34.0)
MCHC: 31 g/dL (ref 30.0–36.0)
MCV: 104.2 fL — ABNORMAL HIGH (ref 80.0–100.0)
Monocytes Absolute: 0.3 10*3/uL (ref 0.1–1.0)
Monocytes Relative: 6 %
Neutro Abs: 3.4 10*3/uL (ref 1.7–7.7)
Neutrophils Relative %: 74 %
Platelets: 142 10*3/uL — ABNORMAL LOW (ref 150–400)
RBC: 3.13 MIL/uL — ABNORMAL LOW (ref 3.87–5.11)
RDW: 16.8 % — ABNORMAL HIGH (ref 11.5–15.5)
WBC: 4.6 10*3/uL (ref 4.0–10.5)
nRBC: 0 % (ref 0.0–0.2)

## 2019-12-13 LAB — VITAMIN B12: Vitamin B-12: 688 pg/mL (ref 180–914)

## 2019-12-13 LAB — COMPREHENSIVE METABOLIC PANEL
ALT: 10 U/L (ref 0–44)
AST: 10 U/L — ABNORMAL LOW (ref 15–41)
Albumin: 3.1 g/dL — ABNORMAL LOW (ref 3.5–5.0)
Alkaline Phosphatase: 64 U/L (ref 38–126)
Anion gap: 13 (ref 5–15)
BUN: 12 mg/dL (ref 8–23)
CO2: 27 mmol/L (ref 22–32)
Calcium: 8.6 mg/dL — ABNORMAL LOW (ref 8.9–10.3)
Chloride: 100 mmol/L (ref 98–111)
Creatinine, Ser: 0.98 mg/dL (ref 0.44–1.00)
GFR calc Af Amer: >60 mL/min (ref >60)
GFR calc non Af Amer: 55 mL/min — ABNORMAL LOW (ref >60)
Glucose, Bld: 139 mg/dL — ABNORMAL HIGH (ref 70–99)
Potassium: 3 mmol/L — ABNORMAL LOW (ref 3.5–5.1)
Sodium: 140 mmol/L (ref 135–145)
Total Bilirubin: 0.7 mg/dL (ref 0.3–1.2)
Total Protein: 6.7 g/dL (ref 6.5–8.1)

## 2019-12-13 LAB — LACTATE DEHYDROGENASE: LDH: 150 U/L (ref 98–192)

## 2019-12-13 LAB — FOLATE: Folate: 8.8 ng/mL (ref >5.9)

## 2019-12-13 LAB — IRON AND TIBC
Iron: 23 ug/dL — ABNORMAL LOW (ref 28–170)
Saturation Ratios: 23 % (ref 10.4–31.8)
TIBC: 101 ug/dL — ABNORMAL LOW (ref 250–450)
UIBC: 78 ug/dL

## 2019-12-13 LAB — FERRITIN: Ferritin: 288 ng/mL (ref 11–307)

## 2019-12-13 MED ORDER — DIPHENHYDRAMINE HCL 50 MG/ML IJ SOLN: 25.0000 mg | Freq: Once | INTRAMUSCULAR | Status: DC | PRN

## 2019-12-13 MED ORDER — DIPHENHYDRAMINE HCL 50 MG/ML IJ SOLN: 50.0000 mg | Freq: Once | INTRAMUSCULAR | Status: DC | PRN

## 2019-12-13 MED ORDER — PROCHLORPERAZINE MALEATE 10 MG PO TABS
10.0000 mg | ORAL_TABLET | Freq: Once | ORAL | Status: AC
  Administered 2019-12-13: 10 mg via ORAL
  Filled 2019-12-13: qty 1

## 2019-12-13 MED ORDER — POTASSIUM CHLORIDE CRYS ER 20 MEQ PO TBCR
EXTENDED_RELEASE_TABLET | ORAL | Status: AC
  Filled 2019-12-13: qty 2

## 2019-12-13 MED ORDER — DEXAMETHASONE 4 MG PO TABS
40.0000 mg | ORAL_TABLET | Freq: Once | ORAL | Status: AC
  Administered 2019-12-13: 40 mg via ORAL
  Filled 2019-12-13: qty 10

## 2019-12-13 MED ORDER — EPINEPHRINE HCL 0.1 MG/ML IJ SOLN: 0.2500 mg | Freq: Once | INTRAMUSCULAR | Status: DC | PRN

## 2019-12-13 MED ORDER — POTASSIUM CHLORIDE CRYS ER 20 MEQ PO TBCR
40.0000 meq | EXTENDED_RELEASE_TABLET | Freq: Once | ORAL | Status: AC
  Administered 2019-12-13: 40 meq via ORAL

## 2019-12-13 MED ORDER — ALBUTEROL SULFATE (2.5 MG/3ML) 0.083% IN NEBU: 2.5000 mg | INHALATION_SOLUTION | Freq: Once | RESPIRATORY_TRACT | Status: DC | PRN

## 2019-12-13 MED ORDER — METHYLPREDNISOLONE SODIUM SUCC 125 MG IJ SOLR: 125.0000 mg | Freq: Once | INTRAMUSCULAR | Status: DC | PRN

## 2019-12-13 MED ORDER — SODIUM CHLORIDE 0.9 % IV SOLN: Freq: Once | INTRAVENOUS | Status: DC | PRN

## 2019-12-13 MED ORDER — BORTEZOMIB CHEMO SQ INJECTION 3.5 MG (2.5MG/ML)
1.3000 mg/m2 | Freq: Once | INTRAMUSCULAR | Status: AC
  Administered 2019-12-13: 2.25 mg via SUBCUTANEOUS
  Filled 2019-12-13: qty 0.9

## 2019-12-13 MED ORDER — EPOETIN ALFA-EPBX 10000 UNIT/ML IJ SOLN
20000.0000 [IU] | Freq: Once | INTRAMUSCULAR | Status: AC
  Administered 2019-12-13: 20000 [IU] via SUBCUTANEOUS
  Filled 2019-12-13: qty 2

## 2019-12-13 NOTE — Progress Notes (Signed)
Potassium 3.0 today.  Patient to receive Potassium 50meq by mouth verbal order Dr. Delton Coombes.    Patient tolerated Velcade and Retacrit injection with no complaints voiced.  Sites clean and dry with no bruising or swelling noted at site.  Band aid applied.  Vss with discharge and left ambulatory with no s/s of distress noted.

## 2019-12-14 LAB — PROTEIN ELECTROPHORESIS, SERUM
A/G Ratio: 1 (ref 0.7–1.7)
Albumin ELP: 3.2 g/dL (ref 2.9–4.4)
Alpha-1-Globulin: 0.2 g/dL (ref 0.0–0.4)
Alpha-2-Globulin: 1.1 g/dL — ABNORMAL HIGH (ref 0.4–1.0)
Beta Globulin: 1.1 g/dL (ref 0.7–1.3)
Gamma Globulin: 0.7 g/dL (ref 0.4–1.8)
Globulin, Total: 3.1 g/dL (ref 2.2–3.9)
M-Spike, %: 0.4 g/dL — ABNORMAL HIGH
Total Protein ELP: 6.3 g/dL (ref 6.0–8.5)

## 2019-12-14 LAB — KAPPA/LAMBDA LIGHT CHAINS
Kappa free light chain: 35.7 mg/L — ABNORMAL HIGH (ref 3.3–19.4)
Kappa, lambda light chain ratio: 1.54 (ref 0.26–1.65)
Lambda free light chains: 23.2 mg/L (ref 5.7–26.3)

## 2019-12-26 ENCOUNTER — Other Ambulatory Visit (HOSPITAL_COMMUNITY): Payer: Medicare Other

## 2019-12-26 ENCOUNTER — Ambulatory Visit (HOSPITAL_COMMUNITY): Payer: Medicare Other

## 2019-12-26 ENCOUNTER — Other Ambulatory Visit (HOSPITAL_COMMUNITY): Payer: Self-pay | Admitting: *Deleted

## 2019-12-26 DIAGNOSIS — C9000 Multiple myeloma not having achieved remission: Secondary | ICD-10-CM

## 2019-12-29 HISTORY — PX: EYE SURGERY: SHX253

## 2020-01-01 ENCOUNTER — Other Ambulatory Visit (HOSPITAL_COMMUNITY): Payer: Self-pay | Admitting: *Deleted

## 2020-01-01 DIAGNOSIS — C9000 Multiple myeloma not having achieved remission: Secondary | ICD-10-CM

## 2020-01-01 MED ORDER — LENALIDOMIDE 20 MG PO CAPS: ORAL_CAPSULE | ORAL | 0 refills | Status: DC

## 2020-01-02 ENCOUNTER — Ambulatory Visit (HOSPITAL_COMMUNITY): Payer: Medicare Other

## 2020-01-02 ENCOUNTER — Ambulatory Visit (HOSPITAL_COMMUNITY): Payer: Medicare Other | Admitting: Hematology

## 2020-01-02 ENCOUNTER — Other Ambulatory Visit (HOSPITAL_COMMUNITY): Payer: Medicare Other

## 2020-01-03 ENCOUNTER — Other Ambulatory Visit: Payer: Self-pay

## 2020-01-03 ENCOUNTER — Inpatient Hospital Stay (HOSPITAL_COMMUNITY): Payer: Medicare Other | Attending: Hematology

## 2020-01-03 ENCOUNTER — Inpatient Hospital Stay (HOSPITAL_COMMUNITY): Payer: Medicare Other

## 2020-01-03 ENCOUNTER — Inpatient Hospital Stay (HOSPITAL_BASED_OUTPATIENT_CLINIC_OR_DEPARTMENT_OTHER): Payer: Medicare Other | Admitting: Hematology

## 2020-01-03 VITALS — BP 153/71 | HR 70 | Temp 97.8°F | Resp 18

## 2020-01-03 DIAGNOSIS — E876 Hypokalemia: Secondary | ICD-10-CM | POA: Diagnosis not present

## 2020-01-03 DIAGNOSIS — Z5112 Encounter for antineoplastic immunotherapy: Secondary | ICD-10-CM | POA: Insufficient documentation

## 2020-01-03 DIAGNOSIS — D539 Nutritional anemia, unspecified: Secondary | ICD-10-CM | POA: Insufficient documentation

## 2020-01-03 DIAGNOSIS — M8580 Other specified disorders of bone density and structure, unspecified site: Secondary | ICD-10-CM | POA: Insufficient documentation

## 2020-01-03 DIAGNOSIS — E538 Deficiency of other specified B group vitamins: Secondary | ICD-10-CM | POA: Diagnosis not present

## 2020-01-03 DIAGNOSIS — C9000 Multiple myeloma not having achieved remission: Secondary | ICD-10-CM | POA: Diagnosis present

## 2020-01-03 DIAGNOSIS — D649 Anemia, unspecified: Secondary | ICD-10-CM | POA: Diagnosis not present

## 2020-01-03 DIAGNOSIS — Z853 Personal history of malignant neoplasm of breast: Secondary | ICD-10-CM | POA: Diagnosis not present

## 2020-01-03 DIAGNOSIS — D630 Anemia in neoplastic disease: Secondary | ICD-10-CM | POA: Insufficient documentation

## 2020-01-03 LAB — COMPREHENSIVE METABOLIC PANEL
ALT: 7 U/L (ref 0–44)
AST: 10 U/L — ABNORMAL LOW (ref 15–41)
Albumin: 2.9 g/dL — ABNORMAL LOW (ref 3.5–5.0)
Alkaline Phosphatase: 60 U/L (ref 38–126)
Anion gap: 12 (ref 5–15)
BUN: 8 mg/dL (ref 8–23)
CO2: 23 mmol/L (ref 22–32)
Calcium: 7.7 mg/dL — ABNORMAL LOW (ref 8.9–10.3)
Chloride: 105 mmol/L (ref 98–111)
Creatinine, Ser: 0.95 mg/dL (ref 0.44–1.00)
GFR calc Af Amer: >60 mL/min (ref >60)
GFR calc non Af Amer: 57 mL/min — ABNORMAL LOW (ref >60)
Glucose, Bld: 195 mg/dL — ABNORMAL HIGH (ref 70–99)
Potassium: 2.5 mmol/L — CL (ref 3.5–5.1)
Sodium: 140 mmol/L (ref 135–145)
Total Bilirubin: 0.7 mg/dL (ref 0.3–1.2)
Total Protein: 6.5 g/dL (ref 6.5–8.1)

## 2020-01-03 LAB — CBC WITH DIFFERENTIAL/PLATELET
Abs Immature Granulocytes: 0.03 10*3/uL (ref 0.00–0.07)
Basophils Absolute: 0 10*3/uL (ref 0.0–0.1)
Basophils Relative: 1 %
Eosinophils Absolute: 0.1 10*3/uL (ref 0.0–0.5)
Eosinophils Relative: 3 %
HCT: 29.9 % — ABNORMAL LOW (ref 36.0–46.0)
Hemoglobin: 9.2 g/dL — ABNORMAL LOW (ref 12.0–15.0)
Immature Granulocytes: 1 %
Lymphocytes Relative: 18 %
Lymphs Abs: 0.7 10*3/uL (ref 0.7–4.0)
MCH: 31 pg (ref 26.0–34.0)
MCHC: 30.8 g/dL (ref 30.0–36.0)
MCV: 100.7 fL — ABNORMAL HIGH (ref 80.0–100.0)
Monocytes Absolute: 0.3 10*3/uL (ref 0.1–1.0)
Monocytes Relative: 8 %
Neutro Abs: 2.8 10*3/uL (ref 1.7–7.7)
Neutrophils Relative %: 69 %
Platelets: 208 10*3/uL (ref 150–400)
RBC: 2.97 MIL/uL — ABNORMAL LOW (ref 3.87–5.11)
RDW: 16.3 % — ABNORMAL HIGH (ref 11.5–15.5)
WBC: 4 10*3/uL (ref 4.0–10.5)
nRBC: 0 % (ref 0.0–0.2)

## 2020-01-03 LAB — LACTATE DEHYDROGENASE: LDH: 133 U/L (ref 98–192)

## 2020-01-03 MED ORDER — DEXAMETHASONE 4 MG PO TABS
40.0000 mg | ORAL_TABLET | Freq: Once | ORAL | Status: AC
  Administered 2020-01-03: 40 mg via ORAL
  Filled 2020-01-03: qty 10

## 2020-01-03 MED ORDER — POTASSIUM CHLORIDE CRYS ER 20 MEQ PO TBCR: 40.0000 meq | EXTENDED_RELEASE_TABLET | Freq: Two times a day (BID) | ORAL | 2 refills | Status: DC

## 2020-01-03 MED ORDER — EPOETIN ALFA-EPBX 10000 UNIT/ML IJ SOLN
20000.0000 [IU] | Freq: Once | INTRAMUSCULAR | Status: AC
  Administered 2020-01-03: 13:00:00 20000 [IU] via SUBCUTANEOUS
  Filled 2020-01-03: qty 2

## 2020-01-03 MED ORDER — PROCHLORPERAZINE MALEATE 10 MG PO TABS
10.0000 mg | ORAL_TABLET | Freq: Once | ORAL | Status: AC
  Administered 2020-01-03: 10 mg via ORAL
  Filled 2020-01-03: qty 1

## 2020-01-03 MED ORDER — POTASSIUM CHLORIDE CRYS ER 20 MEQ PO TBCR
40.0000 meq | EXTENDED_RELEASE_TABLET | Freq: Two times a day (BID) | ORAL | Status: DC
  Administered 2020-01-03 (×2): 40 meq via ORAL
  Filled 2020-01-03 (×2): qty 2

## 2020-01-03 MED ORDER — SODIUM CHLORIDE 0.9% FLUSH
10.0000 mL | Freq: Once | INTRAVENOUS | Status: AC
  Administered 2020-01-03: 10 mL via INTRAVENOUS

## 2020-01-03 MED ORDER — BORTEZOMIB CHEMO SQ INJECTION 3.5 MG (2.5MG/ML)
1.3000 mg/m2 | Freq: Once | INTRAMUSCULAR | Status: AC
  Administered 2020-01-03: 2.25 mg via SUBCUTANEOUS
  Filled 2020-01-03: qty 0.9

## 2020-01-03 MED ORDER — POTASSIUM CHLORIDE 10 MEQ/100ML IV SOLN
10.0000 meq | INTRAVENOUS | Status: AC
  Administered 2020-01-03 (×2): 10 meq via INTRAVENOUS
  Filled 2020-01-03 (×2): qty 100

## 2020-01-03 MED ORDER — CYANOCOBALAMIN 1000 MCG/ML IJ SOLN
1000.0000 ug | Freq: Once | INTRAMUSCULAR | Status: AC
  Administered 2020-01-03: 1000 ug via INTRAMUSCULAR
  Filled 2020-01-03: qty 1

## 2020-01-03 MED ORDER — SODIUM CHLORIDE 0.9 % IV SOLN: Freq: Once | INTRAVENOUS | Status: AC

## 2020-01-03 NOTE — Progress Notes (Signed)
[facility] [place], [place] [address]   CLINIC:  Medical Oncology/Hematology  PCP:  Vesta Mixer [address] [place] [address] [phone]   REASON FOR VISIT:  Follow-up for IgA Kappa Multiple Myeloma   CURRENT THERAPY: RVD  BRIEF ONCOLOGIC HISTORY:  Oncology History  Multiple myeloma not having achieved remission ([place])  01/20/2018 Initial Diagnosis   Multiple myeloma not having achieved remission ([place])   01/26/2018 -  Chemotherapy   The patient had bortezomib SQ (VELCADE) chemo injection 2.5 mg, 1.3 mg/m2 = 2.5 mg, Subcutaneous,  Once, 26 of 28 cycles Administration: 2.5 mg (01/26/2018), 2.5 mg (02/02/2018), 2.5 mg (02/09/2018), 2.5 mg (02/16/2018), 2.5 mg (02/23/2018), 2.5 mg (03/02/2018), 2.5 mg (03/09/2018), 2.5 mg (03/30/2018), 2.5 mg (04/06/2018), 2.5 mg (04/13/2018), 2.5 mg (04/21/2018), 2.5 mg (05/06/2018), 2.5 mg (05/11/2018), 2.5 mg (05/20/2018), 2.5 mg (05/27/2018), 2.5 mg (06/10/2018), 2.5 mg (06/17/2018), 2.5 mg (06/24/2018), 2.5 mg (07/08/2018), 2.5 mg (07/15/2018), 2.5 mg (07/22/2018), 2.5 mg (08/05/2018), 2.5 mg (08/12/2018), 2.5 mg (09/02/2018), 2.5 mg (09/09/2018), 2.5 mg (09/16/2018), 2.5 mg (09/30/2018), 2.5 mg (10/07/2018), 2.5 mg (10/14/2018), 2.5 mg (10/31/2018), 2.5 mg (11/07/2018), 2.5 mg (11/14/2018), 2.5 mg (11/28/2018), 2.25 mg (12/05/2018), 2.25 mg (12/12/2018), 2.25 mg (12/26/2018), 2.25 mg (01/02/2019), 2.25 mg (01/09/2019), 2.25 mg (01/23/2019), 2.25 mg (01/30/2019), 2.25 mg (02/06/2019), 2.25 mg (02/20/2019), 2.25 mg (02/27/2019), 2.25 mg (03/06/2019), 2.25 mg (03/20/2019), 2.25 mg (03/27/2019), 2.25 mg (04/03/2019), 2.25 mg (04/17/2019), 2.25 mg (04/24/2019), 2.25 mg (05/01/2019), 2.25 mg (05/15/2019), 2.25 mg (05/23/2019), 2.25 mg (05/30/2019), 2.25 mg (06/13/2019), 2.25 mg (06/20/2019), 2.25 mg (06/27/2019), 2.25 mg (07/11/2019), 2.25 mg (07/18/2019), 2.25 mg (07/25/2019), 2.25 mg (08/08/2019), 2.25 mg (08/15/2019), 2.25 mg (08/22/2019), 2.25 mg (09/05/2019), 2.25 mg  (09/12/2019), 2.25 mg (09/19/2019), 2.25 mg (10/03/2019), 2.25 mg (10/10/2019), 2.25 mg (10/17/2019), 2.25 mg (10/31/2019), 2.25 mg (11/07/2019), 2.25 mg (11/14/2019), 2.25 mg (11/29/2019), 2.25 mg (12/06/2019), 2.25 mg (12/13/2019), 2.25 mg (01/03/2020)  for chemotherapy treatment.         INTERVAL HISTORY:  Ms. Asch 79 y.o. female seen for toxicity assessment and next treatment for her myeloma.  She denies any tingling or numbness in extremities.  She reports taking potassium 20 mEq 3 times a day.  She denies any diarrhea.  Appetite is 100%.  Energy levels are 50%.  No fevers no chills reported.  REVIEW OF SYSTEMS:  Review of Systems  All other systems reviewed and are negative.    PAST MEDICAL/SURGICAL HISTORY:  Past Medical History:  Diagnosis Date  . Breast cancer ([place])    left breast/ 2008/ surg/ rad tx  . Coronary artery disease   . Diabetes mellitus    Past Surgical History:  Procedure Laterality Date  . ABDOMINAL HYSTERECTOMY    . BREAST SURGERY       SOCIAL HISTORY:  Social History   Socioeconomic History  . Marital status: Divorced    Spouse name: Not on file  . Number of children: Not on file  . Years of education: Not on file  . Highest education level: Not on file  Occupational History  . Not on file  Tobacco Use  . Smoking status: Never Smoker  . Smokeless tobacco: Never Used  Substance and Sexual Activity  . Alcohol use: No  . Drug use: No  . Sexual activity: Yes    Birth control/protection: Surgical  Other Topics Concern  . Not on file  Social History Narrative  . Not on file  Social Determinants of Health   Financial Resource Strain:   . Difficulty of Paying Living Expenses: Not on file  Food Insecurity:   . Worried About Charity fundraiser in the Last Year: Not on file  . Ran Out of Food in the Last Year: Not on file  Transportation Needs:   . Lack of Transportation (Medical): Not on file  . Lack of Transportation (Non-Medical): Not on  file  Physical Activity:   . Days of Exercise per Week: Not on file  . Minutes of Exercise per Session: Not on file  Stress:   . Feeling of Stress : Not on file  Social Connections:   . Frequency of Communication with Friends and Family: Not on file  . Frequency of Social Gatherings with Friends and Family: Not on file  . Attends Religious Services: Not on file  . Active Member of Clubs or Organizations: Not on file  . Attends Archivist Meetings: Not on file  . Marital Status: Not on file  Intimate Partner Violence:   . Fear of Current or Ex-Partner: Not on file  . Emotionally Abused: Not on file  . Physically Abused: Not on file  . Sexually Abused: Not on file    FAMILY HISTORY:  Family History  Problem Relation Age of Onset  . Obesity Sister     CURRENT MEDICATIONS:  Outpatient Encounter Medications as of 01/03/2020  Medication Sig  . acyclovir (ZOVIRAX) 400 MG tablet Take 1 tablet (400 mg total) by mouth 2 (two) times daily.  Marland Kitchen aspirin 81 MG tablet Take 81 mg by mouth daily.    . bortezomib IV (VELCADE) 3.5 MG injection Inject into the vein once. weekly  . cholecalciferol (VITAMIN D) 1000 units tablet Take 1,000 Units by mouth daily.  . Denosumab (XGEVA [place]) Inject into the skin. Every 28 days  . dexamethasone (DECADRON) 4 MG tablet Take 10 tablets (40 mg) on days 1, 8, and 15 of chemo. Repeat every 21 days.  Marland Kitchen glipiZIDE (GLUCOTROL) 5 MG tablet Take 5 mg by mouth daily before breakfast.   . lenalidomide (REVLIMID) 20 MG capsule Take 1 capsule by mouth once daily for 14 days on, and 7 days off of a 21 day cycle.  Marland Kitchen lisinopril-hydrochlorothiazide (PRINZIDE,ZESTORETIC) 20-25 MG tablet Take 1 tablet by mouth daily.   . metFORMIN (GLUCOPHAGE) 1000 MG tablet Take 1,000 mg by mouth 2 times daily at 12 noon and 4 pm.    . [DISCONTINUED] potassium chloride (MICRO-K) 10 MEQ CR capsule Take 2 capsules (20 mEq total) by mouth 2 (two) times daily.  Marland Kitchen acetaminophen (TYLENOL) 500  MG tablet Take 500 mg by mouth every 6 (six) hours as needed for mild pain or moderate pain.  Marland Kitchen ondansetron (ZOFRAN) 8 MG tablet Take 1 tablet (8 mg total) by mouth 2 (two) times daily as needed (Nausea or vomiting). (Patient not taking: Reported on 01/03/2020)  . potassium chloride SA (KLOR-CON) 20 MEQ tablet Take 2 tablets (40 mEq total) by mouth 2 (two) times daily.  . prochlorperazine (COMPAZINE) 10 MG tablet Take 1 tablet (10 mg total) by mouth every 6 (six) hours as needed (Nausea or vomiting). (Patient not taking: Reported on 01/03/2020)   No facility-administered encounter medications on file as of 01/03/2020.    ALLERGIES:  Allergies  Allergen Reactions  . Motrin [Ibuprofen] Rash     PHYSICAL EXAM:  ECOG Performance status: 1  Vitals:   01/03/20 1012  BP: (!) 155/60  Pulse: 74  Resp: 18  Temp: 97.8 F (36.6 C)  SpO2: 97%   Filed Weights   01/03/20 1012  Weight: 155 lb 3.2 oz (70.4 kg)    Physical Exam Vitals reviewed.  Constitutional:      Appearance: Normal appearance. She is obese.  HENT:     Head: Normocephalic.     Nose: Nose normal.     Mouth/Throat:     Mouth: Mucous membranes are moist.     Pharynx: Oropharynx is clear.  Eyes:     Extraocular Movements: Extraocular movements intact.     Conjunctiva/sclera: Conjunctivae normal.  Cardiovascular:     Rate and Rhythm: Normal rate and regular rhythm.     Pulses: Normal pulses.     Heart sounds: Normal heart sounds.  Pulmonary:     Effort: Pulmonary effort is normal.     Breath sounds: Normal breath sounds.  Abdominal:     General: Bowel sounds are normal.     Palpations: Abdomen is soft.  Musculoskeletal:        General: Normal range of motion.     Cervical back: Normal range of motion.  Skin:    General: Skin is warm and dry.  Neurological:     General: No focal deficit present.     Mental Status: She is alert and oriented to person, place, and time. Mental status is at baseline.  Psychiatric:          Mood and Affect: Mood normal.        Behavior: Behavior normal.        Thought Content: Thought content normal.        Judgment: Judgment normal.      LABORATORY DATA:  I have reviewed the labs as listed.  CBC    Component Value Date/Time   WBC 4.0 01/03/2020 0927   RBC 2.97 (L) 01/03/2020 0927   HGB 9.2 (L) 01/03/2020 0927   HCT 29.9 (L) 01/03/2020 0927   PLT 208 01/03/2020 0927   MCV 100.7 (H) 01/03/2020 0927   MCH 31.0 01/03/2020 0927   MCHC 30.8 01/03/2020 0927   RDW 16.3 (H) 01/03/2020 0927   LYMPHSABS 0.7 01/03/2020 0927   MONOABS 0.3 01/03/2020 0927   EOSABS 0.1 01/03/2020 0927   BASOSABS 0.0 01/03/2020 0927   CMP Latest Ref Rng & Units 01/03/2020 12/13/2019 12/06/2019  Glucose 70 - 99 mg/dL 195(H) 139(H) 178(H)  BUN 8 - 23 mg/dL _0 Creatinine 0.44 - 1.00 mg/dL 0.95 0.98 0.95  Sodium 135 - 145 mmol/L 140 140 141  Potassium 3.5 - 5.1 mmol/L 2.5(LL) 3.0(L) 2.9(L)  Chloride 98 - 111 mmol/L 105 100 97(L)  CO2 22 - 32 mmol/L _1 Calcium 8.9 - 10.3 mg/dL 7.7(L) 8.6(L) 9.2  Total Protein 6.5 - 8.1 g/dL 6.5 6.7 7.1  Total Bilirubin 0.3 - 1.2 mg/dL 0.7 0.7 0.5  Alkaline Phos 38 - 126 U/L 60 64 69  AST 15 - 41 U/L 10(L) 10(L) 11(L)  ALT 0 - 44 U/L _2 I have independently reviewed her scans and discussed with the patient.   ASSESSMENT & PLAN:   Multiple myeloma not having achieved remission ([place]) 1.  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  Beta-2 MG of 3.2, LDH normal.  SPEP with 0.9 g/dL, free light chain ratio of 114, kappa light chains of 7096. - Skeletal survey on 02/16/2018 shows subtle patchy areas of osteopenia of the thoracolumbar spine and possibly distal right clavicle which may reflect subtle changes of multiple myeloma. -RVD regimen started on 01/26/2018. -We  reviewed myeloma panel from 12/13/2019.  M spike improved to 0.4 g/dL.  Previously it was 0.6 g/dL.  Free light chain ratio is 1.54 with kappa light chains 35.7.  LDH is normal. -We will continue the same treatment at this time.  We have sent myeloma panel from today.  We will follow it in 4 weeks.  2.  Bone strengthening: -She will continue to receive denosumab monthly.  She will continue calcium and vitamin D supplements.  3.  Severe hypokalemia: -Potassium today is 2.5.  She denies any chest pains or lightheadedness. -I reviewed her home medications.  Potassium is being taken 20 meq 3 times a day. -She will receive IV and p.o. potassium in the office today.  I will increase her home potassium to 40 mEq twice daily.  4.  Normocytic anemia: -Retacrit was started on 09/05/2019. -She is receiving Retacrit as needed.  Today hemoglobin is 9.2.  Ferritin is 288 with percent saturation of 23. -Today she will receive Retacrit.   Orders placed this encounter:  No orders of the defined types were placed in this encounter.     Derek Jack, MD  [place] [phone]

## 2020-01-03 NOTE — Patient Instructions (Addendum)
[place] at [facility] Discharge Instructions  You were seen today by Dr. Delton Coombes. He went over your recent lab results. Your potassium is very low today, we will give you potassium today as well as send in a new prescription for 65mEq twice a day. He will see you back in 1 month for labs, treatment and follow up.   Thank you for choosing [place] at [facility] to provide your oncology and hematology care.  To afford each patient quality time with our provider, please arrive at least 15 minutes before your scheduled appointment time.   If you have a lab appointment with the [facility] please come in thru the  Main Entrance and check in at the main information desk  You need to re-schedule your appointment should you arrive 10 or more minutes late.  We strive to give you quality time with our providers, and arriving late affects you and other patients whose appointments are after yours.  Also, if you no show three or more times for appointments you may be dismissed from the clinic at the providers discretion.     Again, thank you for choosing [facility].  Our hope is that these requests will decrease the amount of time that you wait before being seen by our physicians.       _____________________________________________________________  Should you have questions after your visit to [facility], please contact our office at [phone] between the hours of 8:00 a.m. and 4:30 p.m.  Voicemails left after 4:00 p.m. will not be returned until the following business day.  For prescription refill requests, have your pharmacy contact our office and allow 72 hours.    Cancer Center Support Programs:   > Cancer Support Group  2nd Tuesday of the month 1pm-2pm, Journey Room

## 2020-01-03 NOTE — Assessment & Plan Note (Addendum)
1.  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  Beta-2 MG of 3.2, LDH normal.  SPEP with 0.9 g/dL, free light chain ratio of 114, kappa light chains of 7096. - Skeletal survey on 02/16/2018 shows subtle patchy areas of osteopenia of the thoracolumbar spine and possibly distal right clavicle which may reflect subtle changes of multiple myeloma. -RVD regimen started on 01/26/2018. -We reviewed myeloma panel from 12/13/2019.  M spike improved to 0.4 g/dL.  Previously it was 0.6 g/dL.  Free light chain ratio is 1.54 with kappa light chains 35.7.  LDH is normal. -We will continue the same treatment at this time.  We have sent myeloma panel from today.  We will follow it in 4 weeks.  2.  Bone strengthening: -She will continue to receive denosumab monthly.  She will continue calcium and vitamin D supplements.  3.  Severe hypokalemia: -Potassium today is 2.5.  She denies any chest pains or lightheadedness. -I reviewed her home medications.  Potassium is being taken 20 meq 3 times a day. -She will receive IV and p.o. potassium in the office today.  I will increase her home potassium to 40 mEq twice daily.  4.  Normocytic anemia: -Retacrit was started on 09/05/2019. -She is receiving Retacrit as needed.  Today hemoglobin is 9.2.  Ferritin is 288 with percent saturation of 23. -Today she will receive Retacrit.

## 2020-01-03 NOTE — Progress Notes (Signed)
01/03/20  Received order to give Potassium chloride 20 meq ivpb x 1 today.  Orders entered.  T.O. Dr Beckey Downing LPN/Eiliyah Reh Ronnald Ramp, PharmD

## 2020-01-03 NOTE — Progress Notes (Signed)
CRITICAL VALUE ALERT Critical value received:  K 2.5 Date of notification:  01/03/2020 Time of notification: [id number] Critical value read back:  Yes.   Nurse who received alert:  Isidoro Donning RN MD notified time and response:  Dr Raliegh Ip notified and he is to see patient this morning.

## 2020-01-03 NOTE — Progress Notes (Signed)
To treatment room for IV 20 meq potassium and Potassium 51meq by mouth before and after infusion.  Ok to treat today verbal order Dr. Delton Coombes.  Patient tolerated infusion and injections with no complaints voiced.  Peripheral IV site clean and dry with no bruising or swelling noted.  Denied pain at site.  Band aid applied.  VSS with discharge and left ambulatory with no s/s of distress noted.

## 2020-01-04 LAB — IMMUNOFIXATION ELECTROPHORESIS
IgA: 338 mg/dL (ref 64–422)
IgG (Immunoglobin G), Serum: 1043 mg/dL (ref 586–1602)
IgM (Immunoglobulin M), Srm: 81 mg/dL (ref 26–217)
Total Protein ELP: 6.2 g/dL (ref 6.0–8.5)

## 2020-01-04 LAB — PROTEIN ELECTROPHORESIS, SERUM
A/G Ratio: 1 (ref 0.7–1.7)
Albumin ELP: 3.1 g/dL (ref 2.9–4.4)
Alpha-1-Globulin: 0.2 g/dL (ref 0.0–0.4)
Alpha-2-Globulin: 1 g/dL (ref 0.4–1.0)
Beta Globulin: 1 g/dL (ref 0.7–1.3)
Gamma Globulin: 0.9 g/dL (ref 0.4–1.8)
Globulin, Total: 3 g/dL (ref 2.2–3.9)
M-Spike, %: 0.2 g/dL — ABNORMAL HIGH
Total Protein ELP: 6.1 g/dL (ref 6.0–8.5)

## 2020-01-04 LAB — KAPPA/LAMBDA LIGHT CHAINS
Kappa free light chain: 44.1 mg/L — ABNORMAL HIGH (ref 3.3–19.4)
Kappa, lambda light chain ratio: 1.47 (ref 0.26–1.65)
Lambda free light chains: 30 mg/L — ABNORMAL HIGH (ref 5.7–26.3)

## 2020-01-08 ENCOUNTER — Encounter (HOSPITAL_COMMUNITY): Payer: Self-pay | Admitting: Hematology

## 2020-01-09 ENCOUNTER — Other Ambulatory Visit (HOSPITAL_COMMUNITY): Payer: Medicare Other

## 2020-01-09 ENCOUNTER — Ambulatory Visit (HOSPITAL_COMMUNITY): Payer: Medicare Other

## 2020-01-10 ENCOUNTER — Encounter (HOSPITAL_COMMUNITY): Payer: Self-pay

## 2020-01-10 ENCOUNTER — Inpatient Hospital Stay (HOSPITAL_COMMUNITY): Payer: Medicare Other

## 2020-01-10 ENCOUNTER — Other Ambulatory Visit: Payer: Self-pay

## 2020-01-10 VITALS — BP 114/71 | HR 77 | Temp 97.9°F | Resp 18 | Wt 147.0 lb

## 2020-01-10 DIAGNOSIS — E538 Deficiency of other specified B group vitamins: Secondary | ICD-10-CM

## 2020-01-10 DIAGNOSIS — E876 Hypokalemia: Secondary | ICD-10-CM

## 2020-01-10 DIAGNOSIS — Z5112 Encounter for antineoplastic immunotherapy: Secondary | ICD-10-CM | POA: Diagnosis not present

## 2020-01-10 DIAGNOSIS — C9000 Multiple myeloma not having achieved remission: Secondary | ICD-10-CM

## 2020-01-10 LAB — COMPREHENSIVE METABOLIC PANEL
ALT: 8 U/L (ref 0–44)
AST: 8 U/L — ABNORMAL LOW (ref 15–41)
Albumin: 3.1 g/dL — ABNORMAL LOW (ref 3.5–5.0)
Alkaline Phosphatase: 62 U/L (ref 38–126)
Anion gap: 11 (ref 5–15)
BUN: 11 mg/dL (ref 8–23)
CO2: 27 mmol/L (ref 22–32)
Calcium: 7.7 mg/dL — ABNORMAL LOW (ref 8.9–10.3)
Chloride: 101 mmol/L (ref 98–111)
Creatinine, Ser: 0.89 mg/dL (ref 0.44–1.00)
GFR calc Af Amer: >60 mL/min (ref >60)
GFR calc non Af Amer: >60 mL/min (ref >60)
Glucose, Bld: 109 mg/dL — ABNORMAL HIGH (ref 70–99)
Potassium: 2.5 mmol/L — CL (ref 3.5–5.1)
Sodium: 139 mmol/L (ref 135–145)
Total Bilirubin: 1 mg/dL (ref 0.3–1.2)
Total Protein: 6.9 g/dL (ref 6.5–8.1)

## 2020-01-10 LAB — CBC WITH DIFFERENTIAL/PLATELET
Abs Immature Granulocytes: 0.02 10*3/uL (ref 0.00–0.07)
Basophils Absolute: 0 10*3/uL (ref 0.0–0.1)
Basophils Relative: 0 %
Eosinophils Absolute: 0 10*3/uL (ref 0.0–0.5)
Eosinophils Relative: 0 %
HCT: 33.3 % — ABNORMAL LOW (ref 36.0–46.0)
Hemoglobin: 10.4 g/dL — ABNORMAL LOW (ref 12.0–15.0)
Immature Granulocytes: 0 %
Lymphocytes Relative: 14 %
Lymphs Abs: 0.8 10*3/uL (ref 0.7–4.0)
MCH: 31 pg (ref 26.0–34.0)
MCHC: 31.2 g/dL (ref 30.0–36.0)
MCV: 99.1 fL (ref 80.0–100.0)
Monocytes Absolute: 0.9 10*3/uL (ref 0.1–1.0)
Monocytes Relative: 17 %
Neutro Abs: 3.7 10*3/uL (ref 1.7–7.7)
Neutrophils Relative %: 69 %
Platelets: 193 10*3/uL (ref 150–400)
RBC: 3.36 MIL/uL — ABNORMAL LOW (ref 3.87–5.11)
RDW: 17.2 % — ABNORMAL HIGH (ref 11.5–15.5)
WBC: 5.4 10*3/uL (ref 4.0–10.5)
nRBC: 0 % (ref 0.0–0.2)

## 2020-01-10 MED ORDER — BORTEZOMIB CHEMO SQ INJECTION 3.5 MG (2.5MG/ML)
1.3000 mg/m2 | Freq: Once | INTRAMUSCULAR | Status: AC
  Administered 2020-01-10: 2.25 mg via SUBCUTANEOUS
  Filled 2020-01-10: qty 0.9

## 2020-01-10 MED ORDER — PROCHLORPERAZINE MALEATE 10 MG PO TABS
10.0000 mg | ORAL_TABLET | Freq: Once | ORAL | Status: AC
  Administered 2020-01-10: 10 mg via ORAL
  Filled 2020-01-10: qty 1

## 2020-01-10 MED ORDER — DEXAMETHASONE 4 MG PO TABS: 40.0000 mg | ORAL_TABLET | Freq: Once | ORAL | Status: DC

## 2020-01-10 MED ORDER — DENOSUMAB 120 MG/1.7ML ~~LOC~~ SOLN
120.0000 mg | Freq: Once | SUBCUTANEOUS | Status: AC
  Administered 2020-01-10: 120 mg via SUBCUTANEOUS
  Filled 2020-01-10: qty 1.7

## 2020-01-10 MED ORDER — DEXAMETHASONE 4 MG PO TABS
40.0000 mg | ORAL_TABLET | Freq: Once | ORAL | Status: AC
  Administered 2020-01-10: 40 mg via ORAL
  Filled 2020-01-10: qty 10

## 2020-01-10 MED ORDER — POTASSIUM CHLORIDE 10 MEQ/100ML IV SOLN
10.0000 meq | INTRAVENOUS | Status: AC
  Administered 2020-01-10 (×2): 10 meq via INTRAVENOUS
  Filled 2020-01-10 (×2): qty 100

## 2020-01-10 MED ORDER — EPOETIN ALFA-EPBX 10000 UNIT/ML IJ SOLN
20000.0000 [IU] | Freq: Once | INTRAMUSCULAR | Status: AC
  Administered 2020-01-10: 20000 [IU] via SUBCUTANEOUS
  Filled 2020-01-10: qty 2

## 2020-01-10 MED ORDER — POTASSIUM CHLORIDE CRYS ER 10 MEQ PO TBCR
40.0000 meq | EXTENDED_RELEASE_TABLET | ORAL | Status: AC
  Administered 2020-01-10 (×2): 40 meq via ORAL
  Filled 2020-01-10 (×4): qty 4

## 2020-01-10 MED ORDER — SODIUM CHLORIDE 0.9 % IV SOLN: Freq: Once | INTRAVENOUS | Status: AC

## 2020-01-10 NOTE — Progress Notes (Signed)
Potassium 2.5 today.  Calcium 7.7 today. Labs reviewed with Dr. Delton Coombes.    Potassium 20 meq IV today with Potassium 40 meq by mouth before infusion and after infusion of potassium verbal order Dr. Delton Coombes.  Pharmacy notified.    Ok to treat today verbal order Dr. Delton Coombes.   Patient tolerated potassium infusions with no complaints voiced.  Peripheral IV site clean and dry with good blood return noted before and after infusions.  No complaints of pain or burning at site.  Band aid applied.  Patient tolerated injections with no complaints voiced.  Sites clean and dry with no bruising or swelling noted at sites.  Band aids applied.  Vss with discharge and left ambulatory with no s/s of distress noted.

## 2020-01-10 NOTE — Progress Notes (Signed)
CRITICAL VALUE ALERT  Critical Value:  K+ 2.5  Date & Time Notied:  01/10/2020 at 1050  Provider Notified: Dr. Delton Coombes  Orders Received/Actions taken: Give KCl 20 mEq IV and Kdur 40 mEq pre and post IV KCl.

## 2020-01-17 ENCOUNTER — Other Ambulatory Visit: Payer: Self-pay

## 2020-01-17 ENCOUNTER — Inpatient Hospital Stay (HOSPITAL_COMMUNITY): Payer: Medicare Other

## 2020-01-17 ENCOUNTER — Encounter (HOSPITAL_COMMUNITY): Payer: Self-pay

## 2020-01-17 VITALS — BP 125/60 | HR 75 | Temp 97.1°F | Resp 17 | Wt 142.0 lb

## 2020-01-17 DIAGNOSIS — C9000 Multiple myeloma not having achieved remission: Secondary | ICD-10-CM

## 2020-01-17 DIAGNOSIS — E538 Deficiency of other specified B group vitamins: Secondary | ICD-10-CM

## 2020-01-17 DIAGNOSIS — Z5112 Encounter for antineoplastic immunotherapy: Secondary | ICD-10-CM | POA: Diagnosis not present

## 2020-01-17 LAB — CBC WITH DIFFERENTIAL/PLATELET
Abs Immature Granulocytes: 0.01 10*3/uL (ref 0.00–0.07)
Basophils Absolute: 0 10*3/uL (ref 0.0–0.1)
Basophils Relative: 0 %
Eosinophils Absolute: 0.1 10*3/uL (ref 0.0–0.5)
Eosinophils Relative: 1 %
HCT: 34.5 % — ABNORMAL LOW (ref 36.0–46.0)
Hemoglobin: 10.8 g/dL — ABNORMAL LOW (ref 12.0–15.0)
Immature Granulocytes: 0 %
Lymphocytes Relative: 18 %
Lymphs Abs: 0.8 10*3/uL (ref 0.7–4.0)
MCH: 30.9 pg (ref 26.0–34.0)
MCHC: 31.3 g/dL (ref 30.0–36.0)
MCV: 98.6 fL (ref 80.0–100.0)
Monocytes Absolute: 0.7 10*3/uL (ref 0.1–1.0)
Monocytes Relative: 15 %
Neutro Abs: 2.9 10*3/uL (ref 1.7–7.7)
Neutrophils Relative %: 66 %
Platelets: 225 10*3/uL (ref 150–400)
RBC: 3.5 MIL/uL — ABNORMAL LOW (ref 3.87–5.11)
RDW: 17.5 % — ABNORMAL HIGH (ref 11.5–15.5)
WBC: 4.4 10*3/uL (ref 4.0–10.5)
nRBC: 0 % (ref 0.0–0.2)

## 2020-01-17 LAB — COMPREHENSIVE METABOLIC PANEL
ALT: 8 U/L (ref 0–44)
AST: 10 U/L — ABNORMAL LOW (ref 15–41)
Albumin: 3 g/dL — ABNORMAL LOW (ref 3.5–5.0)
Alkaline Phosphatase: 72 U/L (ref 38–126)
Anion gap: 15 (ref 5–15)
BUN: 19 mg/dL (ref 8–23)
CO2: 26 mmol/L (ref 22–32)
Calcium: 8 mg/dL — ABNORMAL LOW (ref 8.9–10.3)
Chloride: 98 mmol/L (ref 98–111)
Creatinine, Ser: 1.05 mg/dL — ABNORMAL HIGH (ref 0.44–1.00)
GFR calc Af Amer: 58 mL/min — ABNORMAL LOW (ref >60)
GFR calc non Af Amer: 50 mL/min — ABNORMAL LOW (ref >60)
Glucose, Bld: 233 mg/dL — ABNORMAL HIGH (ref 70–99)
Potassium: 2.4 mmol/L — CL (ref 3.5–5.1)
Sodium: 139 mmol/L (ref 135–145)
Total Bilirubin: 0.6 mg/dL (ref 0.3–1.2)
Total Protein: 7 g/dL (ref 6.5–8.1)

## 2020-01-17 LAB — LACTATE DEHYDROGENASE: LDH: 121 U/L (ref 98–192)

## 2020-01-17 MED ORDER — POTASSIUM CHLORIDE CRYS ER 20 MEQ PO TBCR
40.0000 meq | EXTENDED_RELEASE_TABLET | Freq: Once | ORAL | Status: AC
  Administered 2020-01-17: 40 meq via ORAL
  Filled 2020-01-17: qty 2

## 2020-01-17 MED ORDER — BORTEZOMIB CHEMO SQ INJECTION 3.5 MG (2.5MG/ML)
1.3000 mg/m2 | Freq: Once | INTRAMUSCULAR | Status: AC
  Administered 2020-01-17: 13:00:00 2.25 mg via SUBCUTANEOUS
  Filled 2020-01-17: qty 0.9

## 2020-01-17 MED ORDER — SODIUM CHLORIDE 0.9 % IV SOLN: Freq: Once | INTRAVENOUS | Status: AC

## 2020-01-17 MED ORDER — PROCHLORPERAZINE MALEATE 10 MG PO TABS
10.0000 mg | ORAL_TABLET | Freq: Once | ORAL | Status: AC
  Administered 2020-01-17: 10 mg via ORAL
  Filled 2020-01-17: qty 1

## 2020-01-17 MED ORDER — POTASSIUM CHLORIDE ER 10 MEQ PO TBCR: EXTENDED_RELEASE_TABLET | ORAL | 3 refills | Status: DC

## 2020-01-17 MED ORDER — DEXAMETHASONE 4 MG PO TABS
ORAL_TABLET | ORAL | Status: AC
  Filled 2020-01-17: qty 5

## 2020-01-17 MED ORDER — POTASSIUM CHLORIDE 10 MEQ/100ML IV SOLN
10.0000 meq | INTRAVENOUS | Status: AC
  Administered 2020-01-17 (×2): 10 meq via INTRAVENOUS
  Filled 2020-01-17 (×2): qty 100

## 2020-01-17 MED ORDER — EPOETIN ALFA-EPBX 10000 UNIT/ML IJ SOLN
20000.0000 [IU] | Freq: Once | INTRAMUSCULAR | Status: AC
  Administered 2020-01-17: 20000 [IU] via SUBCUTANEOUS
  Filled 2020-01-17: qty 2

## 2020-01-17 MED ORDER — DEXAMETHASONE 4 MG PO TABS
40.0000 mg | ORAL_TABLET | Freq: Once | ORAL | Status: AC
  Administered 2020-01-17: 40 mg via ORAL
  Filled 2020-01-17: qty 10

## 2020-01-17 NOTE — Progress Notes (Signed)
Patient presents today for Velcade injection and labwork. Vital signs within parameters for treatment. MAR reviewed and updated. Labs pending.    Potassium today 2.4. MD notified by Levy Pupa. Verbal orders received to give 40 mEq of Potassium PO pre infusion , 20 mEq of Potassium IV , and 20 mEq PO Potassium post infusion. Patient teaching performed pertaining to taking her Potassium at home. Understanding verbalized.   Treatment given today per MD orders. Tolerated without adverse affects. Vital signs stable. No complaints at this time. Discharged from clinic ambulatory. F/U with [facility] as scheduled.

## 2020-01-17 NOTE — Progress Notes (Signed)
CRITICAL VALUE ALERT  Critical Value:  K+ 2.4  Date & Time Notied:  01/17/2020 at 1030  Provider Notified: Dr. Delton Coombes  Orders Received/Actions taken: give KCl 20 mEq IV and K-Dur 40 mEq po pre and post infusion.

## 2020-01-17 NOTE — Patient Instructions (Signed)
[facility] Discharge Instructions for Patients Receiving Chemotherapy  Today you received the following chemotherapy agents   To help prevent nausea and vomiting after your treatment, we encourage you to take your nausea medication   If you develop nausea and vomiting that is not controlled by your nausea medication, call the clinic.   BELOW ARE SYMPTOMS THAT SHOULD BE REPORTED IMMEDIATELY:  *FEVER GREATER THAN 100.5 F  *CHILLS WITH OR WITHOUT FEVER  NAUSEA AND VOMITING THAT IS NOT CONTROLLED WITH YOUR NAUSEA MEDICATION  *UNUSUAL SHORTNESS OF BREATH  *UNUSUAL BRUISING OR BLEEDING  TENDERNESS IN MOUTH AND THROAT WITH OR WITHOUT PRESENCE OF ULCERS  *URINARY PROBLEMS  *BOWEL PROBLEMS  UNUSUAL RASH Items with * indicate a potential emergency and should be followed up as soon as possible.  Feel free to call the clinic should you have any questions or concerns. The clinic phone number is [phone].  Please show the CHEMO ALERT CARD at check-in to the Emergency Department and triage nurse.   

## 2020-01-24 ENCOUNTER — Other Ambulatory Visit (HOSPITAL_COMMUNITY): Payer: Self-pay | Admitting: *Deleted

## 2020-01-24 DIAGNOSIS — C9000 Multiple myeloma not having achieved remission: Secondary | ICD-10-CM

## 2020-01-24 MED ORDER — LENALIDOMIDE 20 MG PO CAPS: ORAL_CAPSULE | ORAL | 0 refills | Status: DC

## 2020-01-31 ENCOUNTER — Inpatient Hospital Stay (HOSPITAL_COMMUNITY): Payer: Medicare Other | Attending: Hematology

## 2020-01-31 ENCOUNTER — Inpatient Hospital Stay (HOSPITAL_COMMUNITY): Payer: Medicare Other

## 2020-01-31 ENCOUNTER — Other Ambulatory Visit: Payer: Self-pay

## 2020-01-31 ENCOUNTER — Other Ambulatory Visit (HOSPITAL_COMMUNITY): Payer: Self-pay | Admitting: Nurse Practitioner

## 2020-01-31 ENCOUNTER — Inpatient Hospital Stay (HOSPITAL_BASED_OUTPATIENT_CLINIC_OR_DEPARTMENT_OTHER): Payer: Medicare Other | Admitting: Hematology

## 2020-01-31 ENCOUNTER — Encounter (HOSPITAL_COMMUNITY): Payer: Self-pay | Admitting: Hematology

## 2020-01-31 VITALS — BP 122/53 | HR 75 | Temp 96.8°F | Resp 18

## 2020-01-31 DIAGNOSIS — E876 Hypokalemia: Secondary | ICD-10-CM | POA: Insufficient documentation

## 2020-01-31 DIAGNOSIS — C9000 Multiple myeloma not having achieved remission: Secondary | ICD-10-CM | POA: Diagnosis present

## 2020-01-31 DIAGNOSIS — Z5112 Encounter for antineoplastic immunotherapy: Secondary | ICD-10-CM | POA: Insufficient documentation

## 2020-01-31 DIAGNOSIS — D630 Anemia in neoplastic disease: Secondary | ICD-10-CM | POA: Diagnosis not present

## 2020-01-31 DIAGNOSIS — D539 Nutritional anemia, unspecified: Secondary | ICD-10-CM | POA: Diagnosis not present

## 2020-01-31 DIAGNOSIS — E538 Deficiency of other specified B group vitamins: Secondary | ICD-10-CM

## 2020-01-31 DIAGNOSIS — D649 Anemia, unspecified: Secondary | ICD-10-CM | POA: Diagnosis not present

## 2020-01-31 LAB — CBC WITH DIFFERENTIAL/PLATELET
Abs Immature Granulocytes: 0.01 10*3/uL (ref 0.00–0.07)
Basophils Absolute: 0 10*3/uL (ref 0.0–0.1)
Basophils Relative: 1 %
Eosinophils Absolute: 0.1 10*3/uL (ref 0.0–0.5)
Eosinophils Relative: 2 %
HCT: 32.7 % — ABNORMAL LOW (ref 36.0–46.0)
Hemoglobin: 10 g/dL — ABNORMAL LOW (ref 12.0–15.0)
Immature Granulocytes: 0 %
Lymphocytes Relative: 17 %
Lymphs Abs: 0.7 10*3/uL (ref 0.7–4.0)
MCH: 29.9 pg (ref 26.0–34.0)
MCHC: 30.6 g/dL (ref 30.0–36.0)
MCV: 97.6 fL (ref 80.0–100.0)
Monocytes Absolute: 0.5 10*3/uL (ref 0.1–1.0)
Monocytes Relative: 12 %
Neutro Abs: 2.9 10*3/uL (ref 1.7–7.7)
Neutrophils Relative %: 68 %
Platelets: 227 10*3/uL (ref 150–400)
RBC: 3.35 MIL/uL — ABNORMAL LOW (ref 3.87–5.11)
RDW: 17.2 % — ABNORMAL HIGH (ref 11.5–15.5)
WBC: 4.3 10*3/uL (ref 4.0–10.5)
nRBC: 0 % (ref 0.0–0.2)

## 2020-01-31 LAB — COMPREHENSIVE METABOLIC PANEL
ALT: 6 U/L (ref 0–44)
AST: 10 U/L — ABNORMAL LOW (ref 15–41)
Albumin: 3 g/dL — ABNORMAL LOW (ref 3.5–5.0)
Alkaline Phosphatase: 65 U/L (ref 38–126)
Anion gap: 11 (ref 5–15)
BUN: 9 mg/dL (ref 8–23)
CO2: 30 mmol/L (ref 22–32)
Calcium: 7.6 mg/dL — ABNORMAL LOW (ref 8.9–10.3)
Chloride: 98 mmol/L (ref 98–111)
Creatinine, Ser: 0.87 mg/dL (ref 0.44–1.00)
GFR calc Af Amer: >60 mL/min (ref >60)
GFR calc non Af Amer: >60 mL/min (ref >60)
Glucose, Bld: 234 mg/dL — ABNORMAL HIGH (ref 70–99)
Potassium: 2.2 mmol/L — CL (ref 3.5–5.1)
Sodium: 139 mmol/L (ref 135–145)
Total Bilirubin: 1 mg/dL (ref 0.3–1.2)
Total Protein: 6.5 g/dL (ref 6.5–8.1)

## 2020-01-31 LAB — LACTATE DEHYDROGENASE: LDH: 128 U/L (ref 98–192)

## 2020-01-31 MED ORDER — SODIUM CHLORIDE 0.9 % IV SOLN: Freq: Once | INTRAVENOUS | Status: AC

## 2020-01-31 MED ORDER — DEXAMETHASONE 4 MG PO TABS
40.0000 mg | ORAL_TABLET | Freq: Once | ORAL | Status: AC
  Administered 2020-01-31: 40 mg via ORAL
  Filled 2020-01-31: qty 10

## 2020-01-31 MED ORDER — POTASSIUM CHLORIDE CRYS ER 20 MEQ PO TBCR
40.0000 meq | EXTENDED_RELEASE_TABLET | Freq: Once | ORAL | Status: AC
  Administered 2020-01-31: 40 meq via ORAL
  Filled 2020-01-31: qty 2

## 2020-01-31 MED ORDER — PROCHLORPERAZINE MALEATE 10 MG PO TABS
10.0000 mg | ORAL_TABLET | Freq: Once | ORAL | Status: AC
  Administered 2020-01-31: 10 mg via ORAL
  Filled 2020-01-31: qty 1

## 2020-01-31 MED ORDER — POTASSIUM CHLORIDE CRYS ER 20 MEQ PO TBCR: 40.0000 meq | EXTENDED_RELEASE_TABLET | Freq: Three times a day (TID) | ORAL | 2 refills | Status: DC

## 2020-01-31 MED ORDER — BORTEZOMIB CHEMO SQ INJECTION 3.5 MG (2.5MG/ML)
1.3000 mg/m2 | Freq: Once | INTRAMUSCULAR | Status: AC
  Administered 2020-01-31: 12:00:00 2.25 mg via SUBCUTANEOUS
  Filled 2020-01-31: qty 0.9

## 2020-01-31 MED ORDER — POTASSIUM CHLORIDE 10 MEQ/100ML IV SOLN
10.0000 meq | INTRAVENOUS | Status: AC
  Administered 2020-01-31 (×2): 10 meq via INTRAVENOUS
  Filled 2020-01-31: qty 100

## 2020-01-31 MED ORDER — EPOETIN ALFA-EPBX 10000 UNIT/ML IJ SOLN
20000.0000 [IU] | Freq: Once | INTRAMUSCULAR | Status: AC
  Administered 2020-01-31: 20000 [IU] via SUBCUTANEOUS
  Filled 2020-01-31: qty 2

## 2020-01-31 NOTE — Assessment & Plan Note (Signed)
1.  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  Beta-2 MG of 3.2, LDH normal.  SPEP with 0.9 g/dL, free light chain ratio of 114, kappa light chains of 7096. - Skeletal survey on 02/16/2018 shows subtle patchy areas of osteopenia of the thoracolumbar spine and possibly distal right clavicle which may reflect subtle changes of multiple myeloma. -RVD regimen started on 01/26/2018. -We reviewed myeloma panel from 01/03/2020.  M spike further improved to 0.2 g.  Previously it was 0.6 g.  Free light chain ratio is 1.47.  Kappa light chains however went up slightly to 44 from 35.7.  We will keep a close eye on it. -She will proceed with her next cycle today.  We will reevaluate her in 4 weeks.  2.  Bone strengthening: -She will continue to receive denosumab monthly.  She will continue calcium and vitamin D supplements.  3.  Severe hypokalemia: -We have checked her home medication.  She is taking potassium 40 mEq twice daily. -However her potassium is severely low at 2.2.  She will receive potassium IV and p.o. in our clinic today. -We will increase her home potassium to 40 mEq 3 times a day.  4.  Normocytic anemia: -She is receiving Retacrit as needed.

## 2020-01-31 NOTE — Progress Notes (Signed)
CRITICAL VALUE ALERT  Critical Value:  K+ 2.2  Date & Time Notied:  01/31/2020 at [place]  Provider Notified: Dr. Delton Coombes  Orders Received/Actions taken: Give KCl 20 mEq IV and K-dur 40 mEq po pre and post IV KCl.

## 2020-01-31 NOTE — Patient Instructions (Signed)
[facility] Discharge Instructions for Patients Receiving Chemotherapy  Today you received the following chemotherapy agents   To help prevent nausea and vomiting after your treatment, we encourage you to take your nausea medication   If you develop nausea and vomiting that is not controlled by your nausea medication, call the clinic.   BELOW ARE SYMPTOMS THAT SHOULD BE REPORTED IMMEDIATELY:  *FEVER GREATER THAN 100.5 F  *CHILLS WITH OR WITHOUT FEVER  NAUSEA AND VOMITING THAT IS NOT CONTROLLED WITH YOUR NAUSEA MEDICATION  *UNUSUAL SHORTNESS OF BREATH  *UNUSUAL BRUISING OR BLEEDING  TENDERNESS IN MOUTH AND THROAT WITH OR WITHOUT PRESENCE OF ULCERS  *URINARY PROBLEMS  *BOWEL PROBLEMS  UNUSUAL RASH Items with * indicate a potential emergency and should be followed up as soon as possible.  Feel free to call the clinic should you have any questions or concerns. The clinic phone number is [phone].  Please show the CHEMO ALERT CARD at check-in to the Emergency Department and triage nurse.   

## 2020-01-31 NOTE — Patient Instructions (Addendum)
[place] at [facility] Discharge Instructions  You were seen today by Dr. Delton Coombes. He went over your recent lab results. He has sent in a prescription for potassium to your pharmacy, you will need to take 2 tablets three times a day. He will see you back in 4 weeks for labs, treatment and follow up.   Thank you for choosing [place] at [facility] to provide your oncology and hematology care.  To afford each patient quality time with our provider, please arrive at least 15 minutes before your scheduled appointment time.   If you have a lab appointment with the [facility] please come in thru the  Main Entrance and check in at the main information desk  You need to re-schedule your appointment should you arrive 10 or more minutes late.  We strive to give you quality time with our providers, and arriving late affects you and other patients whose appointments are after yours.  Also, if you no show three or more times for appointments you may be dismissed from the clinic at the providers discretion.     Again, thank you for choosing [facility].  Our hope is that these requests will decrease the amount of time that you wait before being seen by our physicians.       _____________________________________________________________  Should you have questions after your visit to [facility], please contact our office at [phone] between the hours of 8:00 a.m. and 4:30 p.m.  Voicemails left after 4:00 p.m. will not be returned until the following business day.  For prescription refill requests, have your pharmacy contact our office and allow 72 hours.    Cancer Center Support Programs:   > Cancer Support Group  2nd Tuesday of the month 1pm-2pm, Journey Room

## 2020-01-31 NOTE — Progress Notes (Signed)
Brittany Archer, [place] [address]   CLINIC:  Medical Oncology/Hematology  PCP:  Vesta Mixer [address] [place] [address] [phone]   REASON FOR VISIT:  Follow-up for IgA Kappa Multiple Myeloma   CURRENT THERAPY: RVD  BRIEF ONCOLOGIC HISTORY:  Oncology History  Multiple myeloma not having achieved remission ([place])  01/20/2018 Initial Diagnosis   Multiple myeloma not having achieved remission ([place])   01/26/2018 -  Chemotherapy   The patient had bortezomib SQ (VELCADE) chemo injection 2.5 mg, 1.3 mg/m2 = 2.5 mg, Subcutaneous,  Once, 27 of 29 cycles Administration: 2.5 mg (01/26/2018), 2.5 mg (02/02/2018), 2.5 mg (02/09/2018), 2.5 mg (02/16/2018), 2.5 mg (02/23/2018), 2.5 mg (03/02/2018), 2.5 mg (03/09/2018), 2.5 mg (03/30/2018), 2.5 mg (04/06/2018), 2.5 mg (04/13/2018), 2.5 mg (04/21/2018), 2.5 mg (05/06/2018), 2.5 mg (05/11/2018), 2.5 mg (05/20/2018), 2.5 mg (05/27/2018), 2.5 mg (06/10/2018), 2.5 mg (06/17/2018), 2.5 mg (06/24/2018), 2.5 mg (07/08/2018), 2.5 mg (07/15/2018), 2.5 mg (07/22/2018), 2.5 mg (08/05/2018), 2.5 mg (08/12/2018), 2.5 mg (09/02/2018), 2.5 mg (09/09/2018), 2.5 mg (09/16/2018), 2.5 mg (09/30/2018), 2.5 mg (10/07/2018), 2.5 mg (10/14/2018), 2.5 mg (10/31/2018), 2.5 mg (11/07/2018), 2.5 mg (11/14/2018), 2.5 mg (11/28/2018), 2.25 mg (12/05/2018), 2.25 mg (12/12/2018), 2.25 mg (12/26/2018), 2.25 mg (01/02/2019), 2.25 mg (01/09/2019), 2.25 mg (01/23/2019), 2.25 mg (01/30/2019), 2.25 mg (02/06/2019), 2.25 mg (02/20/2019), 2.25 mg (02/27/2019), 2.25 mg (03/06/2019), 2.25 mg (03/20/2019), 2.25 mg (03/27/2019), 2.25 mg (04/03/2019), 2.25 mg (04/17/2019), 2.25 mg (04/24/2019), 2.25 mg (05/01/2019), 2.25 mg (05/15/2019), 2.25 mg (05/23/2019), 2.25 mg (05/30/2019), 2.25 mg (06/13/2019), 2.25 mg (06/20/2019), 2.25 mg (06/27/2019), 2.25 mg (07/11/2019), 2.25 mg (07/18/2019), 2.25 mg (07/25/2019), 2.25 mg (08/08/2019), 2.25 mg (08/15/2019), 2.25 mg (08/22/2019), 2.25 mg (09/05/2019), 2.25 mg  (09/12/2019), 2.25 mg (09/19/2019), 2.25 mg (10/03/2019), 2.25 mg (10/10/2019), 2.25 mg (10/17/2019), 2.25 mg (10/31/2019), 2.25 mg (11/07/2019), 2.25 mg (11/14/2019), 2.25 mg (11/29/2019), 2.25 mg (12/06/2019), 2.25 mg (12/13/2019), 2.25 mg (01/03/2020), 2.25 mg (01/10/2020), 2.25 mg (01/17/2020), 2.25 mg (01/31/2020)  for chemotherapy treatment.         INTERVAL HISTORY:  Brittany Archer 79 y.o. female seen for follow-up of multiple myeloma and toxicity assessment.  Appetite and energy levels are 75%.  Reports that she has been taking all the pills as prescribed.  She gets her pills in a pill pack from pharmacy.  Denies any diarrhea.  Denies missing any doses of potassium.  No fevers or chills reported.  No tingling or numbness reported.  REVIEW OF SYSTEMS:  Review of Systems  All other systems reviewed and are negative.    PAST MEDICAL/SURGICAL HISTORY:  Past Medical History:  Diagnosis Date  . Breast cancer ([place])    left breast/ 2008/ surg/ rad tx  . Coronary artery disease   . Diabetes mellitus    Past Surgical History:  Procedure Laterality Date  . ABDOMINAL HYSTERECTOMY    . BREAST SURGERY       SOCIAL HISTORY:  Social History   Socioeconomic History  . Marital status: Divorced    Spouse name: Not on file  . Number of children: Not on file  . Years of education: Not on file  . Highest education level: Not on file  Occupational History  . Not on file  Tobacco Use  . Smoking status: Never Smoker  . Smokeless tobacco: Never Used  Substance and Sexual Activity  . Alcohol use: No  . Drug use: No  . Sexual activity: Yes    Birth control/protection:  Surgical  Other Topics Concern  . Not on file  Social History Narrative  . Not on file   Social Determinants of Health   Financial Resource Strain:   . Difficulty of Paying Living Expenses: Not on file  Food Insecurity:   . Worried About Charity fundraiser in the Last Year: Not on file  . Ran Out of Food in the Last Year:  Not on file  Transportation Needs:   . Lack of Transportation (Medical): Not on file  . Lack of Transportation (Non-Medical): Not on file  Physical Activity:   . Days of Exercise per Week: Not on file  . Minutes of Exercise per Session: Not on file  Stress:   . Feeling of Stress : Not on file  Social Connections:   . Frequency of Communication with Friends and Family: Not on file  . Frequency of Social Gatherings with Friends and Family: Not on file  . Attends Religious Services: Not on file  . Active Member of Clubs or Organizations: Not on file  . Attends Archivist Meetings: Not on file  . Marital Status: Not on file  Intimate Partner Violence:   . Fear of Current or Ex-Partner: Not on file  . Emotionally Abused: Not on file  . Physically Abused: Not on file  . Sexually Abused: Not on file    FAMILY HISTORY:  Family History  Problem Relation Age of Onset  . Obesity Sister     CURRENT MEDICATIONS:  Outpatient Encounter Medications as of 01/31/2020  Medication Sig  . aspirin 81 MG tablet Take 81 mg by mouth daily.    . bortezomib IV (VELCADE) 3.5 MG injection Inject into the vein once. weekly  . cholecalciferol (VITAMIN D) 1000 units tablet Take 1,000 Units by mouth daily.  . Denosumab (XGEVA [place]) Inject into the skin. Every 28 days  . dexamethasone (DECADRON) 4 MG tablet Take 10 tablets (40 mg) on days 1, 8, and 15 of chemo. Repeat every 21 days.  Marland Kitchen glipiZIDE (GLUCOTROL) 5 MG tablet Take 5 mg by mouth daily before breakfast.   . lenalidomide (REVLIMID) 20 MG capsule Take 1 capsule by mouth once daily for 14 days on, and 7 days off of a 21 day cycle.  Marland Kitchen lisinopril-hydrochlorothiazide (PRINZIDE,ZESTORETIC) 20-25 MG tablet Take 1 tablet by mouth daily.   . metFORMIN (GLUCOPHAGE) 1000 MG tablet Take 1,000 mg by mouth 2 times daily at 12 noon and 4 pm.    . potassium chloride (KLOR-CON) 10 MEQ tablet Take 40 mEq (4 tablets) twice a day  . [DISCONTINUED] acyclovir  (ZOVIRAX) 400 MG tablet Take 1 tablet (400 mg total) by mouth 2 (two) times daily.  Marland Kitchen acetaminophen (TYLENOL) 500 MG tablet Take 500 mg by mouth every 6 (six) hours as needed for mild pain or moderate pain.  Marland Kitchen ondansetron (ZOFRAN) 8 MG tablet Take 1 tablet (8 mg total) by mouth 2 (two) times daily as needed (Nausea or vomiting). (Patient not taking: Reported on 01/31/2020)  . potassium chloride SA (KLOR-CON) 20 MEQ tablet Take 2 tablets (40 mEq total) by mouth 3 (three) times daily.  . prochlorperazine (COMPAZINE) 10 MG tablet Take 1 tablet (10 mg total) by mouth every 6 (six) hours as needed (Nausea or vomiting). (Patient not taking: Reported on 01/31/2020)   No facility-administered encounter medications on file as of 01/31/2020.    ALLERGIES:  Allergies  Allergen Reactions  . Motrin [Ibuprofen] Rash     PHYSICAL EXAM:  ECOG  Performance status: 1  Vitals:   01/31/20 0932  BP: 136/67  Pulse: 81  Resp: 18  Temp: 97.6 F (36.4 C)  SpO2: 100%   Filed Weights   01/31/20 0932  Weight: 144 lb 4.8 oz (65.5 kg)    Physical Exam Vitals reviewed.  Constitutional:      Appearance: Normal appearance. She is obese.  HENT:     Head: Normocephalic.     Nose: Nose normal.     Mouth/Throat:     Mouth: Mucous membranes are moist.     Pharynx: Oropharynx is clear.  Eyes:     Extraocular Movements: Extraocular movements intact.     Conjunctiva/sclera: Conjunctivae normal.  Cardiovascular:     Rate and Rhythm: Normal rate and regular rhythm.     Pulses: Normal pulses.     Heart sounds: Normal heart sounds.  Pulmonary:     Effort: Pulmonary effort is normal.     Breath sounds: Normal breath sounds.  Abdominal:     General: Bowel sounds are normal.     Palpations: Abdomen is soft.  Musculoskeletal:        General: Normal range of motion.     Cervical back: Normal range of motion.  Skin:    General: Skin is warm and dry.  Neurological:     General: No focal deficit present.      Mental Status: She is alert and oriented to person, place, and time. Mental status is at baseline.  Psychiatric:        Mood and Affect: Mood normal.        Behavior: Behavior normal.        Thought Content: Thought content normal.        Judgment: Judgment normal.      LABORATORY DATA:  I have reviewed the labs as listed.  CBC    Component Value Date/Time   WBC 4.3 01/31/2020 0910   RBC 3.35 (L) 01/31/2020 0910   HGB 10.0 (L) 01/31/2020 0910   HCT 32.7 (L) 01/31/2020 0910   PLT 227 01/31/2020 0910   MCV 97.6 01/31/2020 0910   MCH 29.9 01/31/2020 0910   MCHC 30.6 01/31/2020 0910   RDW 17.2 (H) 01/31/2020 0910   LYMPHSABS 0.7 01/31/2020 0910   MONOABS 0.5 01/31/2020 0910   EOSABS 0.1 01/31/2020 0910   BASOSABS 0.0 01/31/2020 0910   CMP Latest Ref Rng & Units 01/31/2020 01/17/2020 01/10/2020  Glucose 70 - 99 mg/dL 234(H) 233(H) 109(H)  BUN 8 - 23 mg/dL '9 19 11  ' Creatinine 0.44 - 1.00 mg/dL 0.87 1.05(H) 0.89  Sodium 135 - 145 mmol/L 139 139 139  Potassium 3.5 - 5.1 mmol/L 2.2(LL) 2.4(LL) 2.5(LL)  Chloride 98 - 111 mmol/L 98 98 101  CO2 22 - 32 mmol/L '30 26 27  ' Calcium 8.9 - 10.3 mg/dL 7.6(L) 8.0(L) 7.7(L)  Total Protein 6.5 - 8.1 g/dL 6.5 7.0 6.9  Total Bilirubin 0.3 - 1.2 mg/dL 1.0 0.6 1.0  Alkaline Phos 38 - 126 U/L 65 72 62  AST 15 - 41 U/L 10(L) 10(L) 8(L)  ALT 0 - 44 U/L '6 8 8      ' I have independently reviewed her scans and discussed with the patient.   ASSESSMENT & PLAN:   Multiple myeloma not having achieved remission ([place]) 1.  IgA kappa plasma cell myeloma, stage I, standard risk: - BM BX on 01/03/2018 at 60% plasma cells, FISH panel with no abnormalities, chromosome analysis showing hyperdiploid he gains of chromosomes  2, 3, 4, 7, 10, 15.  Beta-2 MG of 3.2, LDH normal.  SPEP with 0.9 g/dL, free light chain ratio of 114, kappa light chains of 7096. - Skeletal survey on 02/16/2018 shows subtle patchy areas of osteopenia of the thoracolumbar spine and possibly  distal right clavicle which may reflect subtle changes of multiple myeloma. -RVD regimen started on 01/26/2018. -We reviewed myeloma panel from 01/03/2020.  M spike further improved to 0.2 g.  Previously it was 0.6 g.  Free light chain ratio is 1.47.  Kappa light chains however went up slightly to 44 from 35.7.  We will keep a close eye on it. -She will proceed with her next cycle today.  We will reevaluate her in 4 weeks.  2.  Bone strengthening: -She will continue to receive denosumab monthly.  She will continue calcium and vitamin D supplements.  3.  Severe hypokalemia: -We have checked her home medication.  She is taking potassium 40 mEq twice daily. -However her potassium is severely low at 2.2.  She will receive potassium IV and p.o. in our clinic today. -We will increase her home potassium to 40 mEq 3 times a day.  4.  Normocytic anemia: -She is receiving Retacrit as needed.   Orders placed this encounter:  No orders of the defined types were placed in this encounter.     Derek Jack, MD  [place] [phone]

## 2020-01-31 NOTE — Progress Notes (Signed)
Patient has been assessed, vital signs and labs have been reviewed by Dr. Delton Coombes. ANC, Creatinine, LFTs, and Platelets are within treatment parameters, potassium is low today, please give her 40 mEq po, 20 mEq IV, followed by another 40 mEq po per Dr. Delton Coombes. The patient is good to proceed with treatment at this time.

## 2020-01-31 NOTE — Progress Notes (Signed)
Patient presents today for treatment and follow up visit with Dr. Delton Coombes. Labs pending. Vital signs within parameters for treatment.   Critical potassium notified by lab to [facility]. Potassium 2.2 today.   Verbal orders received from [facility] RN/ Dr. Delton Coombes. Give 40 mEq of Potassium PO pre and post IV infusion. Infuse 20 mEq Potassium IV today.   Treatment given today per MD orders. Tolerated i without adverse affects. Vital signs stable. No complaints at this time. Discharged from clinic ambulatory. F/U with [facility] as scheduled.

## 2020-02-01 LAB — PROTEIN ELECTROPHORESIS, SERUM
A/G Ratio: 0.8 (ref 0.7–1.7)
Albumin ELP: 2.7 g/dL — ABNORMAL LOW (ref 2.9–4.4)
Alpha-1-Globulin: 0.3 g/dL (ref 0.0–0.4)
Alpha-2-Globulin: 1.1 g/dL — ABNORMAL HIGH (ref 0.4–1.0)
Beta Globulin: 1.1 g/dL (ref 0.7–1.3)
Gamma Globulin: 0.9 g/dL (ref 0.4–1.8)
Globulin, Total: 3.4 g/dL (ref 2.2–3.9)
M-Spike, %: 0.5 g/dL — ABNORMAL HIGH
Total Protein ELP: 6.1 g/dL (ref 6.0–8.5)

## 2020-02-01 LAB — KAPPA/LAMBDA LIGHT CHAINS
Kappa free light chain: 30.9 mg/L — ABNORMAL HIGH (ref 3.3–19.4)
Kappa, lambda light chain ratio: 1.32 (ref 0.26–1.65)
Lambda free light chains: 23.4 mg/L (ref 5.7–26.3)

## 2020-02-02 LAB — IMMUNOFIXATION ELECTROPHORESIS
IgA: 275 mg/dL (ref 64–422)
IgG (Immunoglobin G), Serum: 911 mg/dL (ref 586–1602)
IgM (Immunoglobulin M), Srm: 76 mg/dL (ref 26–217)
Total Protein ELP: 6.1 g/dL (ref 6.0–8.5)

## 2020-02-07 ENCOUNTER — Other Ambulatory Visit (HOSPITAL_COMMUNITY): Payer: Medicare Other

## 2020-02-07 ENCOUNTER — Inpatient Hospital Stay (HOSPITAL_COMMUNITY): Payer: Medicare Other

## 2020-02-07 ENCOUNTER — Other Ambulatory Visit: Payer: Self-pay

## 2020-02-07 VITALS — BP 129/61 | HR 78 | Temp 96.8°F | Resp 18 | Wt 139.6 lb

## 2020-02-07 DIAGNOSIS — Z5112 Encounter for antineoplastic immunotherapy: Secondary | ICD-10-CM | POA: Diagnosis not present

## 2020-02-07 DIAGNOSIS — E876 Hypokalemia: Secondary | ICD-10-CM

## 2020-02-07 DIAGNOSIS — E538 Deficiency of other specified B group vitamins: Secondary | ICD-10-CM

## 2020-02-07 DIAGNOSIS — C9000 Multiple myeloma not having achieved remission: Secondary | ICD-10-CM

## 2020-02-07 LAB — COMPREHENSIVE METABOLIC PANEL
ALT: 9 U/L (ref 0–44)
AST: 10 U/L — ABNORMAL LOW (ref 15–41)
Albumin: 3.1 g/dL — ABNORMAL LOW (ref 3.5–5.0)
Alkaline Phosphatase: 67 U/L (ref 38–126)
Anion gap: 10 (ref 5–15)
BUN: 13 mg/dL (ref 8–23)
CO2: 29 mmol/L (ref 22–32)
Calcium: 8.8 mg/dL — ABNORMAL LOW (ref 8.9–10.3)
Chloride: 98 mmol/L (ref 98–111)
Creatinine, Ser: 1.04 mg/dL — ABNORMAL HIGH (ref 0.44–1.00)
GFR calc Af Amer: 59 mL/min — ABNORMAL LOW (ref >60)
GFR calc non Af Amer: 51 mL/min — ABNORMAL LOW (ref >60)
Glucose, Bld: 135 mg/dL — ABNORMAL HIGH (ref 70–99)
Potassium: 2.1 mmol/L — CL (ref 3.5–5.1)
Sodium: 137 mmol/L (ref 135–145)
Total Bilirubin: 1.2 mg/dL (ref 0.3–1.2)
Total Protein: 6.5 g/dL (ref 6.5–8.1)

## 2020-02-07 LAB — CBC WITH DIFFERENTIAL/PLATELET
Abs Immature Granulocytes: 0.04 10*3/uL (ref 0.00–0.07)
Basophils Absolute: 0 10*3/uL (ref 0.0–0.1)
Basophils Relative: 0 %
Eosinophils Absolute: 0.1 10*3/uL (ref 0.0–0.5)
Eosinophils Relative: 3 %
HCT: 33.3 % — ABNORMAL LOW (ref 36.0–46.0)
Hemoglobin: 10.5 g/dL — ABNORMAL LOW (ref 12.0–15.0)
Immature Granulocytes: 1 %
Lymphocytes Relative: 15 %
Lymphs Abs: 0.8 10*3/uL (ref 0.7–4.0)
MCH: 30.3 pg (ref 26.0–34.0)
MCHC: 31.5 g/dL (ref 30.0–36.0)
MCV: 96 fL (ref 80.0–100.0)
Monocytes Absolute: 0.4 10*3/uL (ref 0.1–1.0)
Monocytes Relative: 7 %
Neutro Abs: 4 10*3/uL (ref 1.7–7.7)
Neutrophils Relative %: 74 %
Platelets: 186 10*3/uL (ref 150–400)
RBC: 3.47 MIL/uL — ABNORMAL LOW (ref 3.87–5.11)
RDW: 17.5 % — ABNORMAL HIGH (ref 11.5–15.5)
WBC: 5.3 10*3/uL (ref 4.0–10.5)
nRBC: 0 % (ref 0.0–0.2)

## 2020-02-07 MED ORDER — EPOETIN ALFA-EPBX 10000 UNIT/ML IJ SOLN
INTRAMUSCULAR | Status: AC
  Filled 2020-02-07: qty 2

## 2020-02-07 MED ORDER — CYANOCOBALAMIN 1000 MCG/ML IJ SOLN
1000.0000 ug | Freq: Once | INTRAMUSCULAR | Status: AC
  Administered 2020-02-07: 1000 ug via INTRAMUSCULAR

## 2020-02-07 MED ORDER — EPOETIN ALFA-EPBX 10000 UNIT/ML IJ SOLN
20000.0000 [IU] | Freq: Once | INTRAMUSCULAR | Status: AC
  Administered 2020-02-07: 20000 [IU] via SUBCUTANEOUS

## 2020-02-07 MED ORDER — BORTEZOMIB CHEMO SQ INJECTION 3.5 MG (2.5MG/ML)
1.3000 mg/m2 | Freq: Once | INTRAMUSCULAR | Status: AC
  Administered 2020-02-07: 2.25 mg via SUBCUTANEOUS
  Filled 2020-02-07: qty 0.9

## 2020-02-07 MED ORDER — SODIUM CHLORIDE 0.9 % IV SOLN: INTRAVENOUS | Status: DC

## 2020-02-07 MED ORDER — DENOSUMAB 120 MG/1.7ML ~~LOC~~ SOLN
120.0000 mg | Freq: Once | SUBCUTANEOUS | Status: AC
  Administered 2020-02-07: 120 mg via SUBCUTANEOUS

## 2020-02-07 MED ORDER — POTASSIUM CHLORIDE CRYS ER 20 MEQ PO TBCR
40.0000 meq | EXTENDED_RELEASE_TABLET | ORAL | Status: AC
  Administered 2020-02-07 (×2): 40 meq via ORAL
  Filled 2020-02-07 (×2): qty 2

## 2020-02-07 MED ORDER — POTASSIUM CHLORIDE 10 MEQ/100ML IV SOLN
10.0000 meq | INTRAVENOUS | Status: AC
  Administered 2020-02-07 (×4): 10 meq via INTRAVENOUS
  Filled 2020-02-07 (×4): qty 100

## 2020-02-07 MED ORDER — DEXAMETHASONE 4 MG PO TABS
40.0000 mg | ORAL_TABLET | Freq: Once | ORAL | Status: AC
  Administered 2020-02-07: 40 mg via ORAL
  Filled 2020-02-07: qty 10

## 2020-02-07 MED ORDER — CYANOCOBALAMIN 1000 MCG/ML IJ SOLN
INTRAMUSCULAR | Status: AC
  Filled 2020-02-07: qty 1

## 2020-02-07 MED ORDER — DENOSUMAB 120 MG/1.7ML ~~LOC~~ SOLN
SUBCUTANEOUS | Status: AC
  Filled 2020-02-07: qty 1.7

## 2020-02-07 MED ORDER — DEXAMETHASONE 4 MG PO TABS: 40.0000 mg | ORAL_TABLET | Freq: Once | ORAL | Status: DC

## 2020-02-07 MED ORDER — PROCHLORPERAZINE MALEATE 10 MG PO TABS
10.0000 mg | ORAL_TABLET | Freq: Once | ORAL | Status: AC
  Administered 2020-02-07: 10 mg via ORAL
  Filled 2020-02-07: qty 1

## 2020-02-07 NOTE — Patient Instructions (Signed)
[facility] Discharge Instructions for Patients Receiving Chemotherapy  Today you received the following chemotherapy agents   To help prevent nausea and vomiting after your treatment, we encourage you to take your nausea medication   If you develop nausea and vomiting that is not controlled by your nausea medication, call the clinic.   BELOW ARE SYMPTOMS THAT SHOULD BE REPORTED IMMEDIATELY:  *FEVER GREATER THAN 100.5 F  *CHILLS WITH OR WITHOUT FEVER  NAUSEA AND VOMITING THAT IS NOT CONTROLLED WITH YOUR NAUSEA MEDICATION  *UNUSUAL SHORTNESS OF BREATH  *UNUSUAL BRUISING OR BLEEDING  TENDERNESS IN MOUTH AND THROAT WITH OR WITHOUT PRESENCE OF ULCERS  *URINARY PROBLEMS  *BOWEL PROBLEMS  UNUSUAL RASH Items with * indicate a potential emergency and should be followed up as soon as possible.  Feel free to call the clinic should you have any questions or concerns. The clinic phone number is [phone].  Please show the CHEMO ALERT CARD at check-in to the Emergency Department and triage nurse.   

## 2020-02-07 NOTE — Progress Notes (Signed)
Labs reviewed by Dr. Delton Coombes. Proceed with Velcade today. Potassium 2.1. Verbal order received by Janan Ridge RN to give 40 mEq PO pre and post 40 mEq Potassium IV today.   Velcade given today per MD orders. Tolerated infusion without adverse affects. Vital signs stable. No complaints at this time. Discharged from clinic ambulatory. F/U with [facility] as scheduled.

## 2020-02-07 NOTE — Progress Notes (Signed)
CRITICAL VALUE ALERT  Critical Value:  K+ 2.1  Date & Time Notied:  02/07/2020 at 1005  Provider Notified: Dr. Delton Coombes  Orders Received/Actions taken: give KCl 40 mEq IV and Kdur 40 mEq  PO pre and post IV KCl.

## 2020-02-13 ENCOUNTER — Other Ambulatory Visit (HOSPITAL_COMMUNITY): Payer: Self-pay | Admitting: *Deleted

## 2020-02-13 DIAGNOSIS — C9000 Multiple myeloma not having achieved remission: Secondary | ICD-10-CM

## 2020-02-13 MED ORDER — LENALIDOMIDE 20 MG PO CAPS: ORAL_CAPSULE | ORAL | 0 refills | Status: DC

## 2020-02-14 ENCOUNTER — Other Ambulatory Visit (HOSPITAL_COMMUNITY): Payer: Medicare Other

## 2020-02-14 ENCOUNTER — Inpatient Hospital Stay (HOSPITAL_COMMUNITY): Payer: Medicare Other

## 2020-02-14 ENCOUNTER — Encounter (HOSPITAL_COMMUNITY): Payer: Self-pay

## 2020-02-14 ENCOUNTER — Other Ambulatory Visit: Payer: Self-pay

## 2020-02-14 VITALS — BP 106/51 | HR 67 | Temp 98.3°F | Resp 18 | Wt 139.5 lb

## 2020-02-14 DIAGNOSIS — C9000 Multiple myeloma not having achieved remission: Secondary | ICD-10-CM

## 2020-02-14 DIAGNOSIS — Z5112 Encounter for antineoplastic immunotherapy: Secondary | ICD-10-CM | POA: Diagnosis not present

## 2020-02-14 LAB — COMPREHENSIVE METABOLIC PANEL
ALT: 8 U/L (ref 0–44)
AST: 10 U/L — ABNORMAL LOW (ref 15–41)
Albumin: 3.2 g/dL — ABNORMAL LOW (ref 3.5–5.0)
Alkaline Phosphatase: 82 U/L (ref 38–126)
Anion gap: 11 (ref 5–15)
BUN: 23 mg/dL (ref 8–23)
CO2: 27 mmol/L (ref 22–32)
Calcium: 8.3 mg/dL — ABNORMAL LOW (ref 8.9–10.3)
Chloride: 100 mmol/L (ref 98–111)
Creatinine, Ser: 1.23 mg/dL — ABNORMAL HIGH (ref 0.44–1.00)
GFR calc Af Amer: 48 mL/min — ABNORMAL LOW (ref >60)
GFR calc non Af Amer: 42 mL/min — ABNORMAL LOW (ref >60)
Glucose, Bld: 189 mg/dL — ABNORMAL HIGH (ref 70–99)
Potassium: 2.1 mmol/L — CL (ref 3.5–5.1)
Sodium: 138 mmol/L (ref 135–145)
Total Bilirubin: 1.1 mg/dL (ref 0.3–1.2)
Total Protein: 6.8 g/dL (ref 6.5–8.1)

## 2020-02-14 LAB — CBC WITH DIFFERENTIAL/PLATELET
Abs Immature Granulocytes: 0.04 10*3/uL (ref 0.00–0.07)
Basophils Absolute: 0 10*3/uL (ref 0.0–0.1)
Basophils Relative: 0 %
Eosinophils Absolute: 0 10*3/uL (ref 0.0–0.5)
Eosinophils Relative: 1 %
HCT: 35.3 % — ABNORMAL LOW (ref 36.0–46.0)
Hemoglobin: 11 g/dL — ABNORMAL LOW (ref 12.0–15.0)
Immature Granulocytes: 1 %
Lymphocytes Relative: 6 %
Lymphs Abs: 0.3 10*3/uL — ABNORMAL LOW (ref 0.7–4.0)
MCH: 29.8 pg (ref 26.0–34.0)
MCHC: 31.2 g/dL (ref 30.0–36.0)
MCV: 95.7 fL (ref 80.0–100.0)
Monocytes Absolute: 0.7 10*3/uL (ref 0.1–1.0)
Monocytes Relative: 11 %
Neutro Abs: 4.9 10*3/uL (ref 1.7–7.7)
Neutrophils Relative %: 81 %
Platelets: 212 10*3/uL (ref 150–400)
RBC: 3.69 MIL/uL — ABNORMAL LOW (ref 3.87–5.11)
RDW: 17.8 % — ABNORMAL HIGH (ref 11.5–15.5)
WBC: 6 10*3/uL (ref 4.0–10.5)
nRBC: 0.3 % — ABNORMAL HIGH (ref 0.0–0.2)

## 2020-02-14 MED ORDER — POTASSIUM CHLORIDE 10 MEQ/100ML IV SOLN
INTRAVENOUS | Status: AC
  Filled 2020-02-14: qty 100

## 2020-02-14 MED ORDER — PROCHLORPERAZINE MALEATE 10 MG PO TABS
10.0000 mg | ORAL_TABLET | Freq: Once | ORAL | Status: AC
  Administered 2020-02-14: 12:00:00 10 mg via ORAL
  Filled 2020-02-14: qty 1

## 2020-02-14 MED ORDER — SODIUM CHLORIDE 0.9 % IV SOLN: INTRAVENOUS | Status: DC

## 2020-02-14 MED ORDER — BORTEZOMIB CHEMO SQ INJECTION 3.5 MG (2.5MG/ML)
1.3000 mg/m2 | Freq: Once | INTRAMUSCULAR | Status: AC
  Administered 2020-02-14: 2.25 mg via SUBCUTANEOUS
  Filled 2020-02-14: qty 0.9

## 2020-02-14 MED ORDER — POTASSIUM CHLORIDE 10 MEQ/100ML IV SOLN
10.0000 meq | INTRAVENOUS | Status: AC
  Administered 2020-02-14 (×4): 10 meq via INTRAVENOUS
  Filled 2020-02-14: qty 100

## 2020-02-14 MED ORDER — POTASSIUM CHLORIDE CRYS ER 20 MEQ PO TBCR
40.0000 meq | EXTENDED_RELEASE_TABLET | Freq: Once | ORAL | Status: AC
  Administered 2020-02-14: 40 meq via ORAL
  Filled 2020-02-14: qty 2

## 2020-02-14 MED ORDER — DEXAMETHASONE 4 MG PO TABS
40.0000 mg | ORAL_TABLET | Freq: Once | ORAL | Status: AC
  Administered 2020-02-14: 40 mg via ORAL
  Filled 2020-02-14: qty 10

## 2020-02-14 MED ORDER — POTASSIUM CHLORIDE CRYS ER 20 MEQ PO TBCR
40.0000 meq | EXTENDED_RELEASE_TABLET | Freq: Once | ORAL | Status: AC
  Administered 2020-02-14: 40 meq via ORAL

## 2020-02-14 MED ORDER — PALONOSETRON HCL INJECTION 0.25 MG/5ML
INTRAVENOUS | Status: AC
  Filled 2020-02-14: qty 5

## 2020-02-14 NOTE — Patient Instructions (Signed)
[facility] Discharge Instructions for Patients Receiving Chemotherapy   Beginning January 23rd 2017 lab work for the [facility] will be done in the  Main lab at [facility] on 1st floor. If you have a lab appointment with the [facility] please come in thru the  Main Entrance and check in at the main information desk   Today you received the following chemotherapy agents Velcade injection as well as Potassium infusions and Retacrit injection. Follow-up as scheduled. Call clinic for any questions or concerns  To help prevent nausea and vomiting after your treatment, we encourage you to take your nausea medication   If you develop nausea and vomiting, or diarrhea that is not controlled by your medication, call the clinic.  The clinic phone number is [phone]. Office hours are Monday-Friday 8:30am-5:00pm.  BELOW ARE SYMPTOMS THAT SHOULD BE REPORTED IMMEDIATELY:  *FEVER GREATER THAN 101.0 F  *CHILLS WITH OR WITHOUT FEVER  NAUSEA AND VOMITING THAT IS NOT CONTROLLED WITH YOUR NAUSEA MEDICATION  *UNUSUAL SHORTNESS OF BREATH  *UNUSUAL BRUISING OR BLEEDING  TENDERNESS IN MOUTH AND THROAT WITH OR WITHOUT PRESENCE OF ULCERS  *URINARY PROBLEMS  *BOWEL PROBLEMS  UNUSUAL RASH Items with * indicate a potential emergency and should be followed up as soon as possible. If you have an emergency after office hours please contact your primary care physician or go to the nearest emergency department.  Please call the clinic during office hours if you have any questions or concerns.   You may also contact the Patient Navigator at [phone] should you have any questions or need assistance in obtaining follow up care.      Resources For Cancer Patients and their Caregivers ? American Cancer Society: Can assist with transportation, wigs, general needs, runs Look Good Feel Better.        313-268-7735 ? Cancer Care: Provides financial assistance, online support  groups, medication/co-pay assistance.  1-800-813-HOPE 317-789-4142) ? [facility] Assists [place] cancer patients and their families through emotional , educational and financial support.  9134620013 ? [place] DSS Where to apply for food stamps, Medicaid and utility assistance. (415)337-0848 ? [facility]: Transportation to medical appointments. 725-455-9443 ? Social Security Administration: May apply for disability if have a Stage IV cancer. 978-578-6715 (614) 548-1071 ? [employer], Disability and Transit Services: Assists with nutrition, care and transit needs. (716)255-5601

## 2020-02-14 NOTE — Progress Notes (Signed)
1011 Labs reviewed with Dr. Delton Coombes and pt approved for Velcade injection with Potassium 40 meq PO pre and post and Potassium 40 meq IV for today per MD for K+ of 2.1. Hgb 11 today and Retacrit injection to be held today per RLockamy NP    Brittany Archer tolerated Velcade injection and potassium infusions well without complaints or incident. Peripheral IV site checked with positive blood return noted prior to and after infusions. VSS upon discharge. Pt discharged via wheelchair in satisfactory condition

## 2020-02-14 NOTE — Progress Notes (Signed)
CRITICAL VALUE ALERT  Critical Value:  K+ 2.1  Date & Time Notied:  02/14/2020 at 0955  Provider Notified: Dr. Delton Coombes  Orders Received/Actions taken: give KCl 40 mEq IV and  K-dur 40 mEq po pre and post IV KCl.

## 2020-02-19 ENCOUNTER — Other Ambulatory Visit: Payer: Self-pay

## 2020-02-19 ENCOUNTER — Inpatient Hospital Stay (HOSPITAL_COMMUNITY)
Admission: EM | Admit: 2020-02-19 | Discharge: 2020-02-27 | DRG: 516 | Disposition: A | Discharge status: Home Health | Payer: Medicare Other | Attending: Internal Medicine | Admitting: Internal Medicine

## 2020-02-19 ENCOUNTER — Encounter (HOSPITAL_COMMUNITY): Payer: Self-pay

## 2020-02-19 ENCOUNTER — Emergency Department (HOSPITAL_COMMUNITY): Payer: Medicare Other

## 2020-02-19 DIAGNOSIS — N390 Urinary tract infection, site not specified: Secondary | ICD-10-CM | POA: Diagnosis present

## 2020-02-19 DIAGNOSIS — L0201 Cutaneous abscess of face: Secondary | ICD-10-CM | POA: Diagnosis present

## 2020-02-19 DIAGNOSIS — I10 Essential (primary) hypertension: Secondary | ICD-10-CM | POA: Diagnosis not present

## 2020-02-19 DIAGNOSIS — Z886 Allergy status to analgesic agent status: Secondary | ICD-10-CM

## 2020-02-19 DIAGNOSIS — N1832 Chronic kidney disease, stage 3b: Secondary | ICD-10-CM | POA: Diagnosis present

## 2020-02-19 DIAGNOSIS — W19XXXA Unspecified fall, initial encounter: Secondary | ICD-10-CM | POA: Diagnosis present

## 2020-02-19 DIAGNOSIS — I2510 Atherosclerotic heart disease of native coronary artery without angina pectoris: Secondary | ICD-10-CM | POA: Diagnosis present

## 2020-02-19 DIAGNOSIS — E119 Type 2 diabetes mellitus without complications: Secondary | ICD-10-CM | POA: Diagnosis not present

## 2020-02-19 DIAGNOSIS — E876 Hypokalemia: Secondary | ICD-10-CM | POA: Diagnosis present

## 2020-02-19 DIAGNOSIS — M8710 Osteonecrosis due to drugs, unspecified bone: Secondary | ICD-10-CM | POA: Diagnosis not present

## 2020-02-19 DIAGNOSIS — K047 Periapical abscess without sinus: Secondary | ICD-10-CM | POA: Diagnosis present

## 2020-02-19 DIAGNOSIS — E1122 Type 2 diabetes mellitus with diabetic chronic kidney disease: Secondary | ICD-10-CM | POA: Diagnosis present

## 2020-02-19 DIAGNOSIS — Z20822 Contact with and (suspected) exposure to covid-19: Secondary | ICD-10-CM | POA: Diagnosis present

## 2020-02-19 DIAGNOSIS — E872 Acidosis: Secondary | ICD-10-CM | POA: Diagnosis present

## 2020-02-19 DIAGNOSIS — E538 Deficiency of other specified B group vitamins: Secondary | ICD-10-CM | POA: Diagnosis present

## 2020-02-19 DIAGNOSIS — Z90710 Acquired absence of both cervix and uterus: Secondary | ICD-10-CM

## 2020-02-19 DIAGNOSIS — M879 Osteonecrosis, unspecified: Secondary | ICD-10-CM

## 2020-02-19 DIAGNOSIS — Z9221 Personal history of antineoplastic chemotherapy: Secondary | ICD-10-CM

## 2020-02-19 DIAGNOSIS — M272 Inflammatory conditions of jaws: Secondary | ICD-10-CM

## 2020-02-19 DIAGNOSIS — R410 Disorientation, unspecified: Secondary | ICD-10-CM | POA: Diagnosis present

## 2020-02-19 DIAGNOSIS — Z923 Personal history of irradiation: Secondary | ICD-10-CM

## 2020-02-19 DIAGNOSIS — K0889 Other specified disorders of teeth and supporting structures: Secondary | ICD-10-CM | POA: Diagnosis not present

## 2020-02-19 DIAGNOSIS — E11628 Type 2 diabetes mellitus with other skin complications: Secondary | ICD-10-CM | POA: Diagnosis not present

## 2020-02-19 DIAGNOSIS — Z114 Encounter for screening for human immunodeficiency virus [HIV]: Secondary | ICD-10-CM

## 2020-02-19 DIAGNOSIS — R001 Bradycardia, unspecified: Secondary | ICD-10-CM | POA: Diagnosis not present

## 2020-02-19 DIAGNOSIS — L03211 Cellulitis of face: Secondary | ICD-10-CM | POA: Diagnosis present

## 2020-02-19 DIAGNOSIS — E1169 Type 2 diabetes mellitus with other specified complication: Secondary | ICD-10-CM | POA: Diagnosis not present

## 2020-02-19 DIAGNOSIS — M87180 Osteonecrosis due to drugs, jaw: Secondary | ICD-10-CM | POA: Diagnosis present

## 2020-02-19 DIAGNOSIS — E11649 Type 2 diabetes mellitus with hypoglycemia without coma: Secondary | ICD-10-CM | POA: Diagnosis not present

## 2020-02-19 DIAGNOSIS — I129 Hypertensive chronic kidney disease with stage 1 through stage 4 chronic kidney disease, or unspecified chronic kidney disease: Secondary | ICD-10-CM | POA: Diagnosis present

## 2020-02-19 DIAGNOSIS — T50995A Adverse effect of other drugs, medicaments and biological substances, initial encounter: Secondary | ICD-10-CM | POA: Diagnosis not present

## 2020-02-19 DIAGNOSIS — B351 Tinea unguium: Secondary | ICD-10-CM | POA: Diagnosis not present

## 2020-02-19 DIAGNOSIS — K083 Retained dental root: Secondary | ICD-10-CM | POA: Diagnosis present

## 2020-02-19 DIAGNOSIS — C9000 Multiple myeloma not having achieved remission: Secondary | ICD-10-CM | POA: Diagnosis present

## 2020-02-19 DIAGNOSIS — M869 Osteomyelitis, unspecified: Secondary | ICD-10-CM | POA: Diagnosis not present

## 2020-02-19 DIAGNOSIS — Z7984 Long term (current) use of oral hypoglycemic drugs: Secondary | ICD-10-CM

## 2020-02-19 DIAGNOSIS — Z7982 Long term (current) use of aspirin: Secondary | ICD-10-CM

## 2020-02-19 DIAGNOSIS — L988 Other specified disorders of the skin and subcutaneous tissue: Secondary | ICD-10-CM | POA: Diagnosis present

## 2020-02-19 DIAGNOSIS — T451X5A Adverse effect of antineoplastic and immunosuppressive drugs, initial encounter: Secondary | ICD-10-CM | POA: Diagnosis present

## 2020-02-19 DIAGNOSIS — Z79899 Other long term (current) drug therapy: Secondary | ICD-10-CM

## 2020-02-19 DIAGNOSIS — Z853 Personal history of malignant neoplasm of breast: Secondary | ICD-10-CM

## 2020-02-19 LAB — URINALYSIS, ROUTINE W REFLEX MICROSCOPIC
Bilirubin Urine: NEGATIVE
Glucose, UA: NEGATIVE mg/dL
Ketones, ur: NEGATIVE mg/dL
Leukocytes,Ua: NEGATIVE
Nitrite: NEGATIVE
Protein, ur: NEGATIVE mg/dL
RBC / HPF: >50 RBC/hpf — ABNORMAL HIGH (ref 0–5)
Specific Gravity, Urine: 1.013 (ref 1.005–1.030)
pH: 5 (ref 5.0–8.0)

## 2020-02-19 LAB — COMPREHENSIVE METABOLIC PANEL
ALT: 10 U/L (ref 0–44)
AST: 14 U/L — ABNORMAL LOW (ref 15–41)
Albumin: 3.2 g/dL — ABNORMAL LOW (ref 3.5–5.0)
Alkaline Phosphatase: 75 U/L (ref 38–126)
Anion gap: 14 (ref 5–15)
BUN: 38 mg/dL — ABNORMAL HIGH (ref 8–23)
CO2: 18 mmol/L — ABNORMAL LOW (ref 22–32)
Calcium: 8.2 mg/dL — ABNORMAL LOW (ref 8.9–10.3)
Chloride: 106 mmol/L (ref 98–111)
Creatinine, Ser: 1.45 mg/dL — ABNORMAL HIGH (ref 0.44–1.00)
GFR calc Af Amer: 40 mL/min — ABNORMAL LOW (ref >60)
GFR calc non Af Amer: 34 mL/min — ABNORMAL LOW (ref >60)
Glucose, Bld: 140 mg/dL — ABNORMAL HIGH (ref 70–99)
Potassium: 2.5 mmol/L — CL (ref 3.5–5.1)
Sodium: 138 mmol/L (ref 135–145)
Total Bilirubin: 0.9 mg/dL (ref 0.3–1.2)
Total Protein: 6.7 g/dL (ref 6.5–8.1)

## 2020-02-19 LAB — CBC WITH DIFFERENTIAL/PLATELET
Abs Immature Granulocytes: 0.03 10*3/uL (ref 0.00–0.07)
Basophils Absolute: 0 10*3/uL (ref 0.0–0.1)
Basophils Relative: 0 %
Eosinophils Absolute: 0 10*3/uL (ref 0.0–0.5)
Eosinophils Relative: 0 %
HCT: 37.4 % (ref 36.0–46.0)
Hemoglobin: 12.1 g/dL (ref 12.0–15.0)
Immature Granulocytes: 1 %
Lymphocytes Relative: 9 %
Lymphs Abs: 0.5 10*3/uL — ABNORMAL LOW (ref 0.7–4.0)
MCH: 29.6 pg (ref 26.0–34.0)
MCHC: 32.4 g/dL (ref 30.0–36.0)
MCV: 91.4 fL (ref 80.0–100.0)
Monocytes Absolute: 0.6 10*3/uL (ref 0.1–1.0)
Monocytes Relative: 11 %
Neutro Abs: 4 10*3/uL (ref 1.7–7.7)
Neutrophils Relative %: 79 %
Platelets: 131 10*3/uL — ABNORMAL LOW (ref 150–400)
RBC: 4.09 MIL/uL (ref 3.87–5.11)
RDW: 18 % — ABNORMAL HIGH (ref 11.5–15.5)
WBC: 5.1 10*3/uL (ref 4.0–10.5)
nRBC: 1.6 % — ABNORMAL HIGH (ref 0.0–0.2)

## 2020-02-19 LAB — MAGNESIUM: Magnesium: 1.3 mg/dL — ABNORMAL LOW (ref 1.7–2.4)

## 2020-02-19 LAB — PHOSPHORUS: Phosphorus: 1.3 mg/dL — ABNORMAL LOW (ref 2.5–4.6)

## 2020-02-19 MED ORDER — PROCHLORPERAZINE EDISYLATE 10 MG/2ML IJ SOLN
5.0000 mg | Freq: Four times a day (QID) | INTRAMUSCULAR | Status: DC | PRN
  Administered 2020-02-23: 19:00:00 5 mg via INTRAVENOUS
  Filled 2020-02-19: qty 2

## 2020-02-19 MED ORDER — MAGNESIUM SULFATE 2 GM/50ML IV SOLN
2.0000 g | Freq: Once | INTRAVENOUS | Status: AC
  Administered 2020-02-19: 2 g via INTRAVENOUS
  Filled 2020-02-19: qty 50

## 2020-02-19 MED ORDER — ACETAMINOPHEN 650 MG RE SUPP: 650.0000 mg | Freq: Four times a day (QID) | RECTAL | Status: DC | PRN

## 2020-02-19 MED ORDER — PIPERACILLIN-TAZOBACTAM 3.375 G IVPB 30 MIN
3.3750 g | Freq: Once | INTRAVENOUS | Status: AC
  Administered 2020-02-20: 3.375 g via INTRAVENOUS
  Filled 2020-02-19: qty 50

## 2020-02-19 MED ORDER — ACETAMINOPHEN 325 MG PO TABS
650.0000 mg | ORAL_TABLET | Freq: Four times a day (QID) | ORAL | Status: DC | PRN
  Administered 2020-02-20: 650 mg via ORAL
  Filled 2020-02-19 (×2): qty 2

## 2020-02-19 MED ORDER — POTASSIUM CHLORIDE 10 MEQ/100ML IV SOLN
10.0000 meq | Freq: Once | INTRAVENOUS | Status: AC
  Administered 2020-02-19: 10 meq via INTRAVENOUS
  Filled 2020-02-19: qty 100

## 2020-02-19 MED ORDER — SODIUM CHLORIDE 0.9 % IV BOLUS
1000.0000 mL | Freq: Once | INTRAVENOUS | Status: AC
  Administered 2020-02-19: 1000 mL via INTRAVENOUS

## 2020-02-19 MED ORDER — POTASSIUM CHLORIDE CRYS ER 20 MEQ PO TBCR
40.0000 meq | EXTENDED_RELEASE_TABLET | Freq: Once | ORAL | Status: AC
  Administered 2020-02-19: 40 meq via ORAL
  Filled 2020-02-19: qty 2

## 2020-02-19 MED ORDER — VANCOMYCIN HCL IN DEXTROSE 1-5 GM/200ML-% IV SOLN
1000.0000 mg | Freq: Once | INTRAVENOUS | Status: AC
  Administered 2020-02-20: 01:00:00 1000 mg via INTRAVENOUS
  Filled 2020-02-19: qty 200

## 2020-02-19 MED ORDER — ENOXAPARIN SODIUM 40 MG/0.4ML ~~LOC~~ SOLN
40.0000 mg | SUBCUTANEOUS | Status: DC
  Administered 2020-02-19 – 2020-02-20 (×2): 40 mg via SUBCUTANEOUS
  Filled 2020-02-19 (×2): qty 0.4

## 2020-02-19 MED ORDER — K PHOS MONO-SOD PHOS DI & MONO 155-852-130 MG PO TABS
500.0000 mg | ORAL_TABLET | Freq: Four times a day (QID) | ORAL | Status: DC
  Administered 2020-02-19 – 2020-02-20 (×3): 500 mg via ORAL
  Filled 2020-02-19 (×7): qty 2

## 2020-02-19 MED ORDER — SODIUM CHLORIDE 0.9 % IV SOLN
1.0000 g | Freq: Once | INTRAVENOUS | Status: AC
  Administered 2020-02-19: 1 g via INTRAVENOUS
  Filled 2020-02-19: qty 10

## 2020-02-19 NOTE — Progress Notes (Signed)
Pharmacy Antibiotic Note  Brittany Archer is a 79 y.o. female admitted on 02/19/2020 with cellulitis.  Pharmacy has been consulted for vancomycin and zosyn dosing.  Plan: Vancomycin 1gm and zosyn 3.375 gm ordered in the ED Will f/u in am for additional antibiotic orders  Height: 5\' 5"  (165.1 cm) Weight: 156 lb (70.8 kg) IBW/kg (Calculated) : 57  Temp (24hrs), Avg:97.6 F (36.4 C), Min:97.6 F (36.4 C), Max:97.6 F (36.4 C)  Recent Labs  Lab 02/14/20 0919 02/19/20 1930  WBC 6.0 5.1  CREATININE 1.23* 1.45*    Estimated Creatinine Clearance: 31 mL/min (A) (by C-G formula based on SCr of 1.45 mg/dL (H)).    Allergies  Allergen Reactions  . Motrin [Ibuprofen] Rash    Thank you for allowing pharmacy to be a part of this patient's care.  Beverlee Nims 02/19/2020 11:36 PM

## 2020-02-19 NOTE — ED Triage Notes (Signed)
Pt reports abscess tooth on r side for past few days.  Reports generalized weakness and fell today.

## 2020-02-19 NOTE — H&P (Signed)
History and Physical    [facility] [id number] DOB: 1941-04-02 DOA: 02/19/2020  PCP: [name], PA-C   Patient coming from: Home  I have personally briefly reviewed patient's old medical records in [place]  Chief Complaint: Tooth pain, weakness and fall.  HPI: Brittany Archer is a 79 y.o. female with medical history significant of left breast cancer, coronary artery disease, type 2 diabetes, hypertension who is coming to the emergency department with complaints of worsening abscessed tooth that she has had for about 2 weeks associated with pain, generalized weakness and had a fall earlier today.  She denies fever, chills, night sweats, but feels tired.  She states that her appetite is decreased.  No headache, rhinorrhea, sore throat, dyspnea or wheezing.  She denies chest pain, palpitations, dizziness, diaphoresis, orthopnea or pitting edema of the lower extremities.  She denies abdominal pain, nausea, vomiting, diarrhea, constipation, melena or hematochezia.  No dysuria, frequency or hematuria.  No polyuria, polydipsia, polyphagia or blurred vision.  ED Course: Initial vital signs temperature 97.6 F, pulse 70, respirations 16, blood pressure 112/65 mmHg and O2 sat 99% on room air.  The patient received potassium replacement and 1 g of ceftriaxone IVPB in the emergency department.  I added parenteral magnesium and oral phosphorus replacement. The patient at times has seemed confused, but family members tell the staff earlier that this is her baseline.  Urinalysis shows a hazy appearance with large hemoglobinuria, some leukocyturia 11-20 WBC per hpf and many bacteria on microscopic examination.  CBC shows a white count of 5.1, hemoglobin 12.1 g/dL and platelets 131.  CMP shows a potassium of 2.5 and CO2 of 18 mmol/L.  Her anion gap is 14.  Glucose 140, BUN 38 and creatinine 1.45 mg/dL.  Total protein is 6.7 albumin 3.2 g/dL.  The rest of the hepatic functions are  unremarkable.  Both magnesium and phosphorus were 1.3 mg/dL.    Imaging: CT head did not show any acute intracranial abnormality.  Maxillofacial CT was ordered and showed findings suspicious of osteomyelitis.  Please see full report below.  Review of Systems: As per HPI otherwise 10 point review of systems negative.   Past Medical History:  Diagnosis Date  . Breast cancer ([place])    left breast/ 2008/ surg/ rad tx  . Coronary artery disease   . Diabetes mellitus     Past Surgical History:  Procedure Laterality Date  . ABDOMINAL HYSTERECTOMY    . BREAST SURGERY       reports that she has never smoked. She has never used smokeless tobacco. She reports that she does not drink alcohol or use drugs.  Allergies  Allergen Reactions  . Motrin [Ibuprofen] Rash    Family History  Problem Relation Age of Onset  . Obesity Sister    Prior to Admission medications   Medication Sig Start Date End Date Taking? Authorizing Provider  acyclovir (ZOVIRAX) 400 MG tablet TAKE 1 TABLET BY MOUTH TWICE DAILY Patient taking differently: Take 400 mg by mouth 2 (two) times daily.  01/31/20  Yes Lockamy, Randi L, NP-C  aspirin 81 MG tablet Take 81 mg by mouth daily.     Yes [provider]  bortezomib IV (VELCADE) 3.5 MG injection Inject 3.5 mg into the vein once a week. weekly    Yes [provider]  dexamethasone (DECADRON) 4 MG tablet Take 10 tablets (40 mg) on days 1, 8, and 15 of chemo. Repeat every 21 days. 04/24/19  Yes Derek Jack, MD  glipiZIDE (GLUCOTROL) 5 MG tablet Take 5 mg by mouth daily before breakfast.  01/31/18  Yes [provider]  lenalidomide (REVLIMID) 20 MG capsule Take 1 capsule by mouth once daily for 14 days on, and 7 days off of a 21 day cycle. 02/13/20  Yes Derek Jack, MD  lisinopril-hydrochlorothiazide (PRINZIDE,ZESTORETIC) 20-25 MG tablet Take 1 tablet by mouth every morning.  01/12/18  Yes [provider]  metFORMIN (GLUCOPHAGE)  1000 MG tablet Take 1,000 mg by mouth 2 times daily at 12 noon and 4 pm.     Yes [provider]  potassium chloride SA (KLOR-CON) 20 MEQ tablet Take 2 tablets (40 mEq total) by mouth 3 (three) times daily. 01/31/20  Yes Derek Jack, MD  potassium chloride (KLOR-CON) 10 MEQ tablet Take 40 mEq (4 tablets) twice a day Patient not taking: Reported on 02/19/2020 01/17/20   Derek Jack, MD    Physical Exam: Vitals:   02/19/20 2115 02/19/20 2130 02/19/20 2145 02/19/20 2200  BP:  127/60  (!) 130/57  Pulse: 65 (!) 56  (!) 58  Resp: 15 19 18 14   Temp:      TempSrc:      SpO2: 99% 100%  99%  Weight:      Height:        Constitutional: NAD, calm, comfortable Eyes: PERRL, lids and conjunctivae normal ENMT: Mucous membranes are moist.  Multiple cavities and poor state of repair of dentition. Posterior pharynx clear of any exudate or lesions. Neck: normal, supple, no masses, no thyromegaly Respiratory: clear to auscultation bilaterally, no wheezing, no crackles. Normal respiratory effort. No accessory muscle use.  Cardiovascular: Bradycardic with HR in the mid to high 50s, no murmurs / rubs / gallops. No extremity edema. 2+ pedal pulses. No carotid bruits.  Abdomen: Nondistended.  BS positive.  Soft, no tenderness, no masses palpated. No hepatosplenomegaly. Musculoskeletal: no clubbing / cyanosis. Good ROM, no contractures. Normal muscle tone.  Skin: Positive submandibular calor, erythema, edema and mild tenderness.  (See pictures) Neurologic: CN 2-12 grossly intact. Sensation intact, DTR normal. Strength 5/5 in all 4.  Psychiatric: Alert and oriented x 2, partially disoriented to time and situation. Normal mood.       Labs on Admission: I have personally reviewed following labs and imaging studies  CBC: Recent Labs  Lab 02/14/20 0919 02/19/20 1930  WBC 6.0 5.1  NEUTROABS 4.9 4.0  HGB 11.0* 12.1  HCT 35.3* 37.4  MCV 95.7 91.4  PLT 212 [id number]*   Basic Metabolic  Panel: Recent Labs  Lab 02/14/20 0919 02/19/20 1930  NA 138 138  K 2.1* 2.5*  CL 100 106  CO2 27 18*  GLUCOSE 189* 140*  BUN 23 38*  CREATININE 1.23* 1.45*  CALCIUM 8.3* 8.2*  MG  --  1.3*  PHOS  --  1.3*   GFR: Estimated Creatinine Clearance: 31 mL/min (A) (by C-G formula based on SCr of 1.45 mg/dL (H)). Liver Function Tests: Recent Labs  Lab 02/14/20 0919 02/19/20 1930  AST 10* 14*  ALT 8 10  ALKPHOS 82 75  BILITOT 1.1 0.9  PROT 6.8 6.7  ALBUMIN 3.2* 3.2*   No results for input(s): LIPASE, AMYLASE in the last 168 hours. No results for input(s): AMMONIA in the last 168 hours. Coagulation Profile: No results for input(s): INR, PROTIME in the last 168 hours. Cardiac Enzymes: No results for input(s): CKTOTAL, CKMB, CKMBINDEX, TROPONINI in the last 168 hours. BNP (last 3 results) No  results for input(s): PROBNP in the last 8760 hours. HbA1C: No results for input(s): HGBA1C in the last 72 hours. CBG: No results for input(s): GLUCAP in the last 168 hours. Lipid Profile: No results for input(s): CHOL, HDL, LDLCALC, TRIG, CHOLHDL, LDLDIRECT in the last 72 hours. Thyroid Function Tests: No results for input(s): TSH, T4TOTAL, FREET4, T3FREE, THYROIDAB in the last 72 hours. Anemia Panel: No results for input(s): VITAMINB12, FOLATE, FERRITIN, TIBC, IRON, RETICCTPCT in the last 72 hours. Urine analysis:    Component Value Date/Time   COLORURINE YELLOW 02/19/2020 2140   APPEARANCEUR HAZY (A) 02/19/2020 2140   LABSPEC 1.013 02/19/2020 2140   PHURINE 5.0 02/19/2020 2140   GLUCOSEU NEGATIVE 02/19/2020 2140   HGBUR LARGE (A) 02/19/2020 2140   BILIRUBINUR NEGATIVE 02/19/2020 2140   KETONESUR NEGATIVE 02/19/2020 2140   PROTEINUR NEGATIVE 02/19/2020 2140   UROBILINOGEN 0.2 09/11/2011 0005   NITRITE NEGATIVE 02/19/2020 2140   LEUKOCYTESUR NEGATIVE 02/19/2020 2140    Radiological Exams on Admission: CT Head Wo Contrast  Result Date: 02/19/2020 CLINICAL DATA:  Right-sided  tooth abscess, generalized weakness, fell EXAM: CT HEAD WITHOUT CONTRAST TECHNIQUE: Contiguous axial images were obtained from the base of the skull through the vertex without intravenous contrast. COMPARISON:  None. FINDINGS: Brain: No acute infarct or hemorrhage. Lateral ventricles and midline structures are unremarkable. No acute extra-axial fluid collections. No mass effect. Vascular: No hyperdense vessel or unexpected calcification. Skull: Normal. Negative for fracture or focal lesion. Sinuses/Orbits: No acute finding. Other: None IMPRESSION: 1. No acute intracranial process. Electronically Signed   By: Randa Ngo M.D.   On: 02/19/2020 19:16   CLINICAL DATA:  Initial evaluation for right-sided facial swelling, abscess tooth. EXAM: CT MAXILLOFACIAL WITH CONTRAST TECHNIQUE: Multidetector CT imaging of the maxillofacial structures was performed with intravenous contrast. Multiplanar CT image reconstructions were also generated. CONTRAST:  63mL OMNIPAQUE IOHEXOL 300 MG/ML  SOLN COMPARISON:  None available. FINDINGS: Osseous: Examination mildly degraded by motion artifact. No acute fracture seen about the facial bones. Remote posttraumatic defect noted at the right lamina papyracea with medial is a shin of a small portion of the intraorbital fat. No discrete osseous lesions. Orbits: Globes and orbital soft tissues within normal limits. No evidence for postseptal or intraorbital cellulitis. Sinuses: Mild-to-moderate mucoperiosteal thickening noted within the right maxillary sinus. Paranasal sinuses are otherwise clear. Mastoid air cells and middle ear cavities are well pneumatized and free of fluid. Soft tissues: Asymmetric soft tissue swelling with inflammatory stranding seen adjacent to the right mandibular body, concerning for acute infection/cellulitis. Innumerable dental caries with periapical lucency seen about the remaining dentition, likely reflecting the source of infection. There is a more focal area  of swelling with soft tissue stranding seen involving the right submental region, likely reflecting phlegmon (series 6, image 39). Changes extend to the overlying skin. No definite discrete abscess or drainable fluid collection at this time. Mild edema seen adjacent to the right mandible without definite discrete odontogenic abscess. There is asymmetric sclerosis with cortical thickening and periosteal reaction seen about the right mandibular body, extending from the parasymphyseal region to approximately the angle of the mandible, suggesting associated osteomyelitis (series 3, image 68). Limited intracranial: Unremarkable. IMPRESSION: 1. Soft tissue swelling with inflammatory stranding adjacent to the right mandible, concerning for acute infection/cellulitis. A more focal area of phlegmon involves the right submental region. No definite discrete abscess or drainable fluid collection. Underlying poor dentition suggests an odontogenic source. 2. Asymmetric sclerosis with periosteal reaction involving the right mandibular  body, suggesting associated osteomyelitis. Electronically Signed By: Jeannine Boga M.D. On: 02/20/2020 01:50  EKG: Independently reviewed.  Vent. rate 74 BPM PR interval * ms QRS duration 90 ms QT/QTc 445/494 ms P-R-T axes 65 14 64 Sinus rhythm Supraventricular bigeminy Borderline prolonged QT interval  Assessment/Plan Principal Problem:   Facial cellulitis Maxillofacial CT shows findings suspicious for osteomyelitis. Will admit to telemetry/inpatient for IV antibiotic therapy. Analgesics as needed. Wound culture and sensitivity. Check Face MRI W/WO contrast for better characterization. Continue vancomycin per pharmacy. Continue Zosyn per pharmacy. Will likely need PICC line for extended IV antibiotic Rx. Follow-up wound culture and sensitivity results. If no improvement will transfer to [facility] for MCS evaluation/debridement.  Active Problems:    Hypokalemia Correcting. Follow-up potassium level.    Hypomagnesemia Replacement ordered. Follow-up magnesium level as needed.    Hypophosphatemia Replacement ordered. Follow-up phosphorus level.    Type 2 diabetes mellitus (HCC)  Carbohydrate modified diet.    Confusion Baseline per family communication to staff.    Coronary artery disease Denies chest pain or dyspnea. On aspirin. Not on beta-blocker or statin.    Hypertension Continue Prinzide 20-25 mg p.o. daily. Monitor BP, renal function electrolytes.    Stage 3b chronic kidney disease Stable. Follow-up renal function electrolytes   DVT prophylaxis: Lovenox SQ. Code Status: Full code. Family Communication: Disposition Plan: Admit for IV antibiotic therapy. Consults called: Consult transition of care team. Admission status: Inpatient/telemetry.   Reubin Milan MD [facility]  If 7PM-7AM, please contact night-coverage [email or web address]  02/19/2020, 10:58 PM

## 2020-02-19 NOTE — ED Notes (Signed)
Date and time results received: 02/19/20 2033 (use smartphrase ".now" to insert current time)  Test: potassium Critical Value: 2.5  Name of Provider Notified: Zammit,MD  Orders Received? Or Actions Taken?:

## 2020-02-19 NOTE — ED Provider Notes (Signed)
[facility] EMERGENCY DEPARTMENT Provider Note   CSN: [id number] Arrival date & time: 02/19/20  1824     History Chief Complaint  Patient presents with  . Weakness    Brittany Archer is a 79 y.o. female.  Patient complains of weakness and she states she cannot fell down to the floor.  The history is provided by the patient. No language interpreter was used.  Weakness Severity:  Mild Onset quality:  Sudden Timing:  Constant Progression:  Worsening Chronicity:  New Context: alcohol use   Associated symptoms: no abdominal pain, no chest pain, no cough, no diarrhea, no frequency, no headaches and no seizures        Past Medical History:  Diagnosis Date  . Breast cancer ([place])    left breast/ 2008/ surg/ rad tx  . Coronary artery disease   . Diabetes mellitus     Patient Active Problem List   Diagnosis Date Noted  . Acute UTI 02/19/2020  . Hypokalemia 01/03/2020  . CKD (chronic kidney disease) 05/01/2019  . B12 deficiency 02/09/2018  . Multiple myeloma not having achieved remission ([place]) 01/20/2018  . Iron deficiency anemia 11/25/2017    Past Surgical History:  Procedure Laterality Date  . ABDOMINAL HYSTERECTOMY    . BREAST SURGERY       OB History   No obstetric history on file.     Family History  Problem Relation Age of Onset  . Obesity Sister     Social History   Tobacco Use  . Smoking status: Never Smoker  . Smokeless tobacco: Never Used  Substance Use Topics  . Alcohol use: No  . Drug use: No    Home Medications Prior to Admission medications   Medication Sig Start Date End Date Taking? Authorizing Provider  acyclovir (ZOVIRAX) 400 MG tablet TAKE 1 TABLET BY MOUTH TWICE DAILY Patient taking differently: Take 400 mg by mouth 2 (two) times daily.  01/31/20  Yes Lockamy, Randi L, NP-C  aspirin 81 MG tablet Take 81 mg by mouth daily.     Yes [provider]  bortezomib IV (VELCADE) 3.5 MG injection Inject 3.5 mg into the vein once  a week. weekly    Yes [provider]  dexamethasone (DECADRON) 4 MG tablet Take 10 tablets (40 mg) on days 1, 8, and 15 of chemo. Repeat every 21 days. 04/24/19  Yes Derek Jack, MD  glipiZIDE (GLUCOTROL) 5 MG tablet Take 5 mg by mouth daily before breakfast.  01/31/18  Yes [provider]  lenalidomide (REVLIMID) 20 MG capsule Take 1 capsule by mouth once daily for 14 days on, and 7 days off of a 21 day cycle. 02/13/20  Yes Derek Jack, MD  lisinopril-hydrochlorothiazide (PRINZIDE,ZESTORETIC) 20-25 MG tablet Take 1 tablet by mouth every morning.  01/12/18  Yes [provider]  metFORMIN (GLUCOPHAGE) 1000 MG tablet Take 1,000 mg by mouth 2 times daily at 12 noon and 4 pm.     Yes [provider]  potassium chloride SA (KLOR-CON) 20 MEQ tablet Take 2 tablets (40 mEq total) by mouth 3 (three) times daily. 01/31/20  Yes Derek Jack, MD  potassium chloride (KLOR-CON) 10 MEQ tablet Take 40 mEq (4 tablets) twice a day Patient not taking: Reported on 02/19/2020 01/17/20   Derek Jack, MD    Allergies    Motrin [ibuprofen]  Review of Systems   Review of Systems  Constitutional: Negative for appetite change and fatigue.  HENT: Negative for congestion, ear discharge and  sinus pressure.   Eyes: Negative for discharge.  Respiratory: Negative for cough.   Cardiovascular: Negative for chest pain.  Gastrointestinal: Negative for abdominal pain and diarrhea.  Genitourinary: Negative for frequency and hematuria.  Musculoskeletal: Negative for back pain.  Skin: Negative for rash.  Neurological: Positive for weakness. Negative for seizures and headaches.  Psychiatric/Behavioral: Negative for hallucinations.    Physical Exam Updated Vital Signs BP (!) 130/57   Pulse (!) 58   Temp 97.6 F (36.4 C) (Oral)   Resp 14   Ht '5\' 5"'  (1.651 m)   Wt 70.8 kg   SpO2 99%   BMI 25.96 kg/m   Physical Exam Vitals and nursing note reviewed.    Constitutional:      Appearance: She is well-developed.  HENT:     Head: Normocephalic.     Nose: Nose normal.  Eyes:     General: No scleral icterus.    Conjunctiva/sclera: Conjunctivae normal.  Neck:     Thyroid: No thyromegaly.  Cardiovascular:     Rate and Rhythm: Normal rate and regular rhythm.     Heart sounds: No murmur. No friction rub. No gallop.   Pulmonary:     Breath sounds: No stridor. No wheezing or rales.  Chest:     Chest wall: No tenderness.  Abdominal:     General: There is no distension.     Tenderness: There is no abdominal tenderness. There is no rebound.  Musculoskeletal:        General: Normal range of motion.     Cervical back: Neck supple.  Lymphadenopathy:     Cervical: No cervical adenopathy.  Skin:    Findings: No erythema or rash.  Neurological:     Mental Status: She is alert and oriented to person, place, and time.     Motor: No abnormal muscle tone.     Coordination: Coordination normal.  Psychiatric:        Behavior: Behavior normal.     ED Results / Procedures / Treatments   Labs (all labs ordered are listed, but only abnormal results are displayed) Labs Reviewed  CBC WITH DIFFERENTIAL/PLATELET - Abnormal; Notable for the following components:      Result Value   RDW 18.0 (*)    Platelets 131 (*)    nRBC 1.6 (*)    Lymphs Abs 0.5 (*)    All other components within normal limits  COMPREHENSIVE METABOLIC PANEL - Abnormal; Notable for the following components:   Potassium 2.5 (*)    CO2 18 (*)    Glucose, Bld 140 (*)    BUN 38 (*)    Creatinine, Ser 1.45 (*)    Calcium 8.2 (*)    Albumin 3.2 (*)    AST 14 (*)    GFR calc non Af Amer 34 (*)    GFR calc Af Amer 40 (*)    All other components within normal limits  URINALYSIS, ROUTINE W REFLEX MICROSCOPIC - Abnormal; Notable for the following components:   APPearance HAZY (*)    Hgb urine dipstick LARGE (*)    RBC / HPF >50 (*)    Bacteria, UA MANY (*)    All other  components within normal limits  URINE CULTURE  SARS CORONAVIRUS 2 (TAT 6-24 HRS)  MAGNESIUM  PHOSPHORUS    EKG EKG Interpretation  Date/Time:  Monday February 19 2020 19:38:04 EST Ventricular Rate:  74 PR Interval:    QRS Duration: 90 QT Interval:  445 QTC Calculation:  494 R Axis:   14 Text Interpretation: Sinus rhythm Supraventricular bigeminy Borderline prolonged QT interval Confirmed by Milton Ferguson [phone]) on 02/19/2020 10:09:48 PM   Radiology CT Head Wo Contrast  Result Date: 02/19/2020 CLINICAL DATA:  Right-sided tooth abscess, generalized weakness, fell EXAM: CT HEAD WITHOUT CONTRAST TECHNIQUE: Contiguous axial images were obtained from the base of the skull through the vertex without intravenous contrast. COMPARISON:  None. FINDINGS: Brain: No acute infarct or hemorrhage. Lateral ventricles and midline structures are unremarkable. No acute extra-axial fluid collections. No mass effect. Vascular: No hyperdense vessel or unexpected calcification. Skull: Normal. Negative for fracture or focal lesion. Sinuses/Orbits: No acute finding. Other: None IMPRESSION: 1. No acute intracranial process. Electronically Signed   By: Randa Ngo M.D.   On: 02/19/2020 19:16    Procedures Procedures (including critical care time)  Medications Ordered in ED Medications  cefTRIAXone (ROCEPHIN) 1 g in sodium chloride 0.9 % 100 mL IVPB (1 g Intravenous New Bag/Given 02/19/20 2227)  magnesium sulfate IVPB 2 g 50 mL (has no administration in time range)  sodium chloride 0.9 % bolus 1,000 mL (0 mLs Intravenous Stopped 02/19/20 2129)  potassium chloride SA (KLOR-CON) CR tablet 40 mEq (40 mEq Oral Given 02/19/20 2112)  potassium chloride 10 mEq in 100 mL IVPB (0 mEq Intravenous Stopped 02/19/20 2225)  potassium chloride 10 mEq in 100 mL IVPB (0 mEq Intravenous Stopped 02/19/20 2225)    ED Course  I have reviewed the triage vital signs and the nursing notes.  Pertinent labs & imaging results that  were available during my care of the patient were reviewed by me and considered in my medical decision making (see chart for details).    CRITICAL CARE Performed by: Milton Ferguson Total critical care time: 35 minutes Critical care time was exclusive of separately billable procedures and treating other patients. Critical care was necessary to treat or prevent imminent or life-threatening deterioration. Critical care was time spent personally by me on the following activities: development of treatment plan with patient and/or surrogate as well as nursing, discussions with consultants, evaluation of patient's response to treatment, examination of patient, obtaining history from patient or surrogate, ordering and performing treatments and interventions, ordering and review of laboratory studies, ordering and review of radiographic studies, pulse oximetry and re-evaluation of patient's condition.  MDM Rules/Calculators/A&P                      Patient with hypokalemia and urinary tract infection she will be admitted to medicine Final Clinical Impression(s) / ED Diagnoses Final diagnoses:  Hypokalemia    Rx / DC Orders ED Discharge Orders    None       Milton Ferguson, MD 02/19/20 2247

## 2020-02-20 ENCOUNTER — Inpatient Hospital Stay (HOSPITAL_COMMUNITY): Payer: Medicare Other

## 2020-02-20 DIAGNOSIS — R410 Disorientation, unspecified: Secondary | ICD-10-CM | POA: Diagnosis present

## 2020-02-20 LAB — COMPREHENSIVE METABOLIC PANEL
ALT: 8 U/L (ref 0–44)
AST: 13 U/L — ABNORMAL LOW (ref 15–41)
Albumin: 2.8 g/dL — ABNORMAL LOW (ref 3.5–5.0)
Alkaline Phosphatase: 65 U/L (ref 38–126)
Anion gap: 12 (ref 5–15)
BUN: 28 mg/dL — ABNORMAL HIGH (ref 8–23)
CO2: 18 mmol/L — ABNORMAL LOW (ref 22–32)
Calcium: 7.6 mg/dL — ABNORMAL LOW (ref 8.9–10.3)
Chloride: 108 mmol/L (ref 98–111)
Creatinine, Ser: 1.15 mg/dL — ABNORMAL HIGH (ref 0.44–1.00)
GFR calc Af Amer: 52 mL/min — ABNORMAL LOW (ref >60)
GFR calc non Af Amer: 45 mL/min — ABNORMAL LOW (ref >60)
Glucose, Bld: 144 mg/dL — ABNORMAL HIGH (ref 70–99)
Potassium: 2.7 mmol/L — CL (ref 3.5–5.1)
Sodium: 138 mmol/L (ref 135–145)
Total Bilirubin: 0.8 mg/dL (ref 0.3–1.2)
Total Protein: 6 g/dL — ABNORMAL LOW (ref 6.5–8.1)

## 2020-02-20 LAB — CBC WITH DIFFERENTIAL/PLATELET
Abs Immature Granulocytes: 0.04 10*3/uL (ref 0.00–0.07)
Basophils Absolute: 0 10*3/uL (ref 0.0–0.1)
Basophils Relative: 0 %
Eosinophils Absolute: 0 10*3/uL (ref 0.0–0.5)
Eosinophils Relative: 0 %
HCT: 32.3 % — ABNORMAL LOW (ref 36.0–46.0)
Hemoglobin: 10.2 g/dL — ABNORMAL LOW (ref 12.0–15.0)
Immature Granulocytes: 1 %
Lymphocytes Relative: 16 %
Lymphs Abs: 0.7 10*3/uL (ref 0.7–4.0)
MCH: 29.6 pg (ref 26.0–34.0)
MCHC: 31.6 g/dL (ref 30.0–36.0)
MCV: 93.6 fL (ref 80.0–100.0)
Monocytes Absolute: 0.6 10*3/uL (ref 0.1–1.0)
Monocytes Relative: 12 %
Neutro Abs: 3.4 10*3/uL (ref 1.7–7.7)
Neutrophils Relative %: 71 %
Platelets: 123 10*3/uL — ABNORMAL LOW (ref 150–400)
RBC: 3.45 MIL/uL — ABNORMAL LOW (ref 3.87–5.11)
RDW: 18.4 % — ABNORMAL HIGH (ref 11.5–15.5)
WBC: 4.8 10*3/uL (ref 4.0–10.5)
nRBC: 0.6 % — ABNORMAL HIGH (ref 0.0–0.2)

## 2020-02-20 LAB — RPR: RPR Ser Ql: NONREACTIVE

## 2020-02-20 LAB — BASIC METABOLIC PANEL
Anion gap: 9 (ref 5–15)
BUN: 23 mg/dL (ref 8–23)
CO2: 17 mmol/L — ABNORMAL LOW (ref 22–32)
Calcium: 7.5 mg/dL — ABNORMAL LOW (ref 8.9–10.3)
Chloride: 111 mmol/L (ref 98–111)
Creatinine, Ser: 1.07 mg/dL — ABNORMAL HIGH (ref 0.44–1.00)
GFR calc Af Amer: 57 mL/min — ABNORMAL LOW (ref >60)
GFR calc non Af Amer: 49 mL/min — ABNORMAL LOW (ref >60)
Glucose, Bld: 144 mg/dL — ABNORMAL HIGH (ref 70–99)
Potassium: 3.9 mmol/L (ref 3.5–5.1)
Sodium: 137 mmol/L (ref 135–145)

## 2020-02-20 LAB — RAPID HIV SCREEN (HIV 1/2 AB+AG)
HIV 1/2 Antibodies: NONREACTIVE
HIV-1 P24 Antigen - HIV24: NONREACTIVE

## 2020-02-20 LAB — HEMOGLOBIN A1C
Hgb A1c MFr Bld: 7.6 % — ABNORMAL HIGH (ref 4.8–5.6)
Mean Plasma Glucose: 171.42 mg/dL

## 2020-02-20 LAB — GLUCOSE, CAPILLARY
Glucose-Capillary: 128 mg/dL — ABNORMAL HIGH (ref 70–99)
Glucose-Capillary: 132 mg/dL — ABNORMAL HIGH (ref 70–99)
Glucose-Capillary: 167 mg/dL — ABNORMAL HIGH (ref 70–99)
Glucose-Capillary: 182 mg/dL — ABNORMAL HIGH (ref 70–99)
Glucose-Capillary: 52 mg/dL — ABNORMAL LOW (ref 70–99)
Glucose-Capillary: 54 mg/dL — ABNORMAL LOW (ref 70–99)

## 2020-02-20 LAB — HIV ANTIBODY (ROUTINE TESTING W REFLEX): HIV Screen 4th Generation wRfx: NONREACTIVE

## 2020-02-20 LAB — TSH: TSH: 0.548 u[IU]/mL (ref 0.350–4.500)

## 2020-02-20 LAB — SARS CORONAVIRUS 2 (TAT 6-24 HRS): SARS Coronavirus 2: NEGATIVE

## 2020-02-20 LAB — PHOSPHORUS: Phosphorus: 1 mg/dL — CL (ref 2.5–4.6)

## 2020-02-20 LAB — VITAMIN B12: Vitamin B-12: 902 pg/mL (ref 180–914)

## 2020-02-20 MED ORDER — DEXTROSE 50 % IV SOLN
INTRAVENOUS | Status: AC
  Administered 2020-02-20: 16:00:00 50 mL
  Filled 2020-02-20: qty 50

## 2020-02-20 MED ORDER — ASPIRIN 81 MG PO CHEW
81.0000 mg | CHEWABLE_TABLET | Freq: Every day | ORAL | Status: DC
  Administered 2020-02-20 – 2020-02-27 (×7): 81 mg via ORAL
  Filled 2020-02-20 (×7): qty 1

## 2020-02-20 MED ORDER — VANCOMYCIN HCL 750 MG/150ML IV SOLN
750.0000 mg | INTRAVENOUS | Status: DC
  Administered 2020-02-21 – 2020-02-26 (×6): 750 mg via INTRAVENOUS
  Filled 2020-02-20 (×7): qty 150

## 2020-02-20 MED ORDER — POTASSIUM CHLORIDE CRYS ER 20 MEQ PO TBCR
40.0000 meq | EXTENDED_RELEASE_TABLET | Freq: Once | ORAL | Status: AC
  Administered 2020-02-20: 05:00:00 40 meq via ORAL
  Filled 2020-02-20: qty 2

## 2020-02-20 MED ORDER — POTASSIUM CHLORIDE CRYS ER 20 MEQ PO TBCR
20.0000 meq | EXTENDED_RELEASE_TABLET | Freq: Once | ORAL | Status: AC
  Administered 2020-02-20: 12:00:00 20 meq via ORAL
  Filled 2020-02-20: qty 1

## 2020-02-20 MED ORDER — POTASSIUM CHLORIDE IN NACL 40-0.9 MEQ/L-% IV SOLN
INTRAVENOUS | Status: AC
  Administered 2020-02-20 (×3): 88 mL/h via INTRAVENOUS
  Filled 2020-02-20 (×7): qty 1000

## 2020-02-20 MED ORDER — LORAZEPAM 2 MG/ML IJ SOLN
0.5000 mg | Freq: Once | INTRAMUSCULAR | Status: AC | PRN
  Administered 2020-02-20: 08:00:00 0.5 mg via INTRAVENOUS
  Filled 2020-02-20: qty 1

## 2020-02-20 MED ORDER — POTASSIUM CHLORIDE CRYS ER 20 MEQ PO TBCR: 40.0000 meq | EXTENDED_RELEASE_TABLET | Freq: Two times a day (BID) | ORAL | Status: DC

## 2020-02-20 MED ORDER — LISINOPRIL-HYDROCHLOROTHIAZIDE 20-25 MG PO TABS: 1.0000 | ORAL_TABLET | Freq: Every morning | ORAL | Status: DC

## 2020-02-20 MED ORDER — GADOBUTROL 1 MMOL/ML IV SOLN
5.0000 mL | Freq: Once | INTRAVENOUS | Status: AC | PRN
  Administered 2020-02-20: 5 mL via INTRAVENOUS

## 2020-02-20 MED ORDER — MAGNESIUM OXIDE 400 (241.3 MG) MG PO TABS
400.0000 mg | ORAL_TABLET | Freq: Two times a day (BID) | ORAL | Status: AC
  Administered 2020-02-20 – 2020-02-21 (×4): 400 mg via ORAL
  Filled 2020-02-20 (×4): qty 1

## 2020-02-20 MED ORDER — LISINOPRIL 10 MG PO TABS
20.0000 mg | ORAL_TABLET | Freq: Every day | ORAL | Status: DC
  Administered 2020-02-20: 20 mg via ORAL
  Filled 2020-02-20: qty 2

## 2020-02-20 MED ORDER — GLIPIZIDE 5 MG PO TABS
5.0000 mg | ORAL_TABLET | Freq: Every day | ORAL | Status: DC
  Administered 2020-02-20: 10:00:00 5 mg via ORAL
  Filled 2020-02-20 (×3): qty 1

## 2020-02-20 MED ORDER — POTASSIUM CHLORIDE 10 MEQ/100ML IV SOLN: 10.0000 meq | INTRAVENOUS | Status: DC

## 2020-02-20 MED ORDER — IOHEXOL 300 MG/ML  SOLN
50.0000 mL | Freq: Once | INTRAMUSCULAR | Status: AC | PRN
  Administered 2020-02-20: 01:00:00 50 mL via INTRAVENOUS

## 2020-02-20 MED ORDER — INSULIN ASPART 100 UNIT/ML ~~LOC~~ SOLN
0.0000 [IU] | Freq: Three times a day (TID) | SUBCUTANEOUS | Status: DC
  Administered 2020-02-20 (×2): 2 [IU] via SUBCUTANEOUS
  Administered 2020-02-23: 5 [IU] via SUBCUTANEOUS
  Administered 2020-02-23: 2 [IU] via SUBCUTANEOUS
  Administered 2020-02-24: 3 [IU] via SUBCUTANEOUS
  Administered 2020-02-24: 2 [IU] via SUBCUTANEOUS
  Administered 2020-02-26: 3 [IU] via SUBCUTANEOUS
  Administered 2020-02-27: 5 [IU] via SUBCUTANEOUS

## 2020-02-20 MED ORDER — POTASSIUM CHLORIDE 10 MEQ/100ML IV SOLN
10.0000 meq | INTRAVENOUS | Status: AC
  Administered 2020-02-20 (×2): 10 meq via INTRAVENOUS
  Filled 2020-02-20 (×2): qty 100

## 2020-02-20 MED ORDER — HYDROCHLOROTHIAZIDE 25 MG PO TABS
25.0000 mg | ORAL_TABLET | Freq: Every day | ORAL | Status: DC
  Administered 2020-02-20: 10:00:00 25 mg via ORAL
  Filled 2020-02-20: qty 1

## 2020-02-20 MED ORDER — PIPERACILLIN-TAZOBACTAM 3.375 G IVPB
3.3750 g | Freq: Three times a day (TID) | INTRAVENOUS | Status: DC
  Administered 2020-02-20 – 2020-02-26 (×19): 3.375 g via INTRAVENOUS
  Filled 2020-02-20 (×20): qty 50

## 2020-02-20 MED ORDER — METFORMIN HCL 500 MG PO TABS: 1000.0000 mg | ORAL_TABLET | Freq: Two times a day (BID) | ORAL | Status: DC

## 2020-02-20 MED ORDER — SODIUM PHOSPHATES 45 MMOLE/15ML IV SOLN
30.0000 mmol | Freq: Once | INTRAVENOUS | Status: AC
  Administered 2020-02-20: 17:00:00 30 mmol via INTRAVENOUS
  Filled 2020-02-20: qty 10

## 2020-02-20 MED ORDER — ACYCLOVIR 400 MG PO TABS
400.0000 mg | ORAL_TABLET | Freq: Two times a day (BID) | ORAL | Status: DC
  Administered 2020-02-20 – 2020-02-27 (×13): 400 mg via ORAL
  Filled 2020-02-20 (×17): qty 1

## 2020-02-20 NOTE — Progress Notes (Signed)
Spoke with primary RN about PICC placement . Patient is being transferred to [facility]  Therefore informed RN that we would place PICC after she was settled at [facility]. Patient has IV access at this time.

## 2020-02-20 NOTE — Progress Notes (Signed)
Patient transferring to [facility]. Notified [facility] of need for transport.

## 2020-02-20 NOTE — ED Notes (Signed)
ED TO INPATIENT HANDOFF REPORT  ED Nurse Name and Phone #: [phone]  S Name/Age/Gender Brittany Archer Spells 79 y.o. female Room/Bed: APA14/APA14  Code Status   Code Status: Full Code  Home/SNF/Other Home Patient oriented to: place Is this baseline? Yes   Triage Complete: Triage complete  Chief Complaint Acute UTI [N39.0] Facial cellulitis [id number]  Triage Note Pt reports abscess tooth on r side for past few days.  Reports generalized weakness and fell today.      Allergies Allergies  Allergen Reactions  . Motrin [Ibuprofen] Rash    Level of Care/Admitting Diagnosis ED Disposition    ED Disposition Condition [facility] Area: [facility] [id number]  Level of Care: Telemetry [5]  Covid Evaluation: Asymptomatic Screening Protocol (No Symptoms)  Diagnosis: Facial cellulitis [id number]  Admitting Physician: Reubin Milan [id number]  Attending Physician: Reubin Milan [id number]  Estimated length of stay: past midnight tomorrow  Certification:: I certify this patient will need inpatient services for at least 2 midnights       B Medical/Surgery History Past Medical History:  Diagnosis Date  . Breast cancer ([place])    left breast/ 2008/ surg/ rad tx  . Coronary artery disease   . Diabetes mellitus    Past Surgical History:  Procedure Laterality Date  . ABDOMINAL HYSTERECTOMY    . BREAST SURGERY       A IV Location/Drains/Wounds Patient Lines/Drains/Airways Status   Active Line/Drains/Airways    Name:   Placement date:   Placement time:   Site:   Days:   Peripheral IV 11/14/19   11/14/19    1355    -   98   Peripheral IV 02/07/20 Left;Posterior Forearm   02/07/20    1106    Forearm   13   Peripheral IV 02/19/20 Left Forearm   02/19/20    1930    Forearm   1   Peripheral IV 02/20/20 Right Wrist   02/20/20    0044    Wrist   less than 1   Wound / Incision (Open or Dehisced) 02/19/20 Other (Comment) Other (Comment) red and  draining   02/19/20    2317    Other (Comment)   1          Intake/Output Last 24 hours  Intake/Output Summary (Last 24 hours) at 02/20/2020 0557 Last data filed at 02/19/2020 2225 Gross per 24 hour  Intake 1200 ml  Output -  Net 1200 ml    Labs/Imaging Results for orders placed or performed during the hospital encounter of 02/19/20 (from the past 48 hour(s))  CBC with Differential/Platelet     Status: Abnormal   Collection Time: 02/19/20  7:30 PM  Result Value Ref Range   WBC 5.1 4.0 - 10.5 K/uL   RBC 4.09 3.87 - 5.11 MIL/uL   Hemoglobin 12.1 12.0 - 15.0 g/dL   HCT 37.4 36.0 - 46.0 %   MCV 91.4 80.0 - 100.0 fL   MCH 29.6 26.0 - 34.0 pg   MCHC 32.4 30.0 - 36.0 g/dL   RDW 18.0 (H) 11.5 - 15.5 %   Platelets 131 (L) 150 - 400 K/uL   nRBC 1.6 (H) 0.0 - 0.2 %   Neutrophils Relative % 79 %   Neutro Abs 4.0 1.7 - 7.7 K/uL   Lymphocytes Relative 9 %   Lymphs Abs 0.5 (L) 0.7 - 4.0 K/uL   Monocytes Relative 11 %   Monocytes Absolute 0.6 0.1 -  1.0 K/uL   Eosinophils Relative 0 %   Eosinophils Absolute 0.0 0.0 - 0.5 K/uL   Basophils Relative 0 %   Basophils Absolute 0.0 0.0 - 0.1 K/uL   Immature Granulocytes 1 %   Abs Immature Granulocytes 0.03 0.00 - 0.07 K/uL    Comment: Performed at [facility], [address] [place], [place] [address]  Comprehensive metabolic panel     Status: Abnormal   Collection Time: 02/19/20  7:30 PM  Result Value Ref Range   Sodium 138 135 - 145 mmol/L   Potassium 2.5 (LL) 3.5 - 5.1 mmol/L    Comment: CRITICAL RESULT CALLED TO, READ BACK BY AND VERIFIED WITH: WESTON,L. AT 2033 ON 02/19/2020 BY EVA    Chloride 106 98 - 111 mmol/L   CO2 18 (L) 22 - 32 mmol/L   Glucose, Bld 140 (H) 70 - 99 mg/dL   BUN 38 (H) 8 - 23 mg/dL   Creatinine, Ser 1.45 (H) 0.44 - 1.00 mg/dL   Calcium 8.2 (L) 8.9 - 10.3 mg/dL   Total Protein 6.7 6.5 - 8.1 g/dL   Albumin 3.2 (L) 3.5 - 5.0 g/dL   AST 14 (L) 15 - 41 U/L   ALT 10 0 - 44 U/L   Alkaline Phosphatase 75 38 -  126 U/L   Total Bilirubin 0.9 0.3 - 1.2 mg/dL   GFR calc non Af Amer 34 (L) >60 mL/min   GFR calc Af Amer 40 (L) >60 mL/min   Anion gap 14 5 - 15    Comment: Performed at [facility], [address], [place], [place] [address]  Magnesium     Status: Abnormal   Collection Time: 02/19/20  7:30 PM  Result Value Ref Range   Magnesium 1.3 (L) 1.7 - 2.4 mg/dL    Comment: Performed at [facility], [address] [place], [place] [address]  Phosphorus     Status: Abnormal   Collection Time: 02/19/20  7:30 PM  Result Value Ref Range   Phosphorus 1.3 (L) 2.5 - 4.6 mg/dL    Comment: Performed at [facility], [address] [place], [place] [address]  TSH     Status: None   Collection Time: 02/19/20  7:30 PM  Result Value Ref Range   TSH 0.548 0.350 - 4.500 uIU/mL    Comment: Performed by a 3rd Generation assay with a functional sensitivity of <=0.01 uIU/mL. Performed at [facility], [address] [place], [place] [address]   Rapid HIV screen (HIV 1/2 Ab+Ag) ([place] Only)     Status: None   Collection Time: 02/19/20  7:30 PM  Result Value Ref Range   HIV-1 P24 Antigen - HIV24 NON REACTIVE NON REACTIVE    Comment: (NOTE) Detection of p24 may be inhibited by biotin in the sample, causing false negative results in acute infection.    HIV 1/2 Antibodies NON REACTIVE NON REACTIVE   Interpretation (HIV Ag Ab)      A non reactive test result means that HIV 1 or HIV 2 antibodies and HIV 1 p24 antigen were not detected in the specimen.    Comment: Performed at [facility], [address], [place], [place] [address]  Urinalysis, Routine w reflex microscopic     Status: Abnormal   Collection Time: 02/19/20  9:40 PM  Result Value Ref Range   Color, Urine YELLOW YELLOW   APPearance HAZY (A) CLEAR   Specific Gravity, Urine 1.013 1.005 - 1.030   pH 5.0 5.0 -  8.0   Glucose, UA NEGATIVE NEGATIVE mg/dL   Hgb urine dipstick LARGE (A) NEGATIVE   Bilirubin Urine NEGATIVE NEGATIVE   Ketones, ur NEGATIVE  NEGATIVE mg/dL   Protein, ur NEGATIVE NEGATIVE mg/dL   Nitrite NEGATIVE NEGATIVE   Leukocytes,Ua NEGATIVE NEGATIVE   RBC / HPF >50 (H) 0 - 5 RBC/hpf   WBC, UA 11-20 0 - 5 WBC/hpf   Bacteria, UA MANY (A) NONE SEEN   Squamous Epithelial / LPF 0-5 0 - 5   Hyaline Casts, UA PRESENT     Comment: Performed at [facility], [address], [place], [place] [address]  Comprehensive metabolic panel     Status: Abnormal   Collection Time: 02/20/20  3:47 AM  Result Value Ref Range   Sodium 138 135 - 145 mmol/L   Potassium 2.7 (LL) 3.5 - 5.1 mmol/L    Comment: CRITICAL RESULT CALLED TO, READ BACK BY AND VERIFIED WITH: Mitali Shenefield,C AT 4:25AM ON 02/20/20 BY FESTERMAN,C    Chloride 108 98 - 111 mmol/L   CO2 18 (L) 22 - 32 mmol/L   Glucose, Bld 144 (H) 70 - 99 mg/dL   BUN 28 (H) 8 - 23 mg/dL   Creatinine, Ser 1.15 (H) 0.44 - 1.00 mg/dL   Calcium 7.6 (L) 8.9 - 10.3 mg/dL   Total Protein 6.0 (L) 6.5 - 8.1 g/dL   Albumin 2.8 (L) 3.5 - 5.0 g/dL   AST 13 (L) 15 - 41 U/L   ALT 8 0 - 44 U/L   Alkaline Phosphatase 65 38 - 126 U/L   Total Bilirubin 0.8 0.3 - 1.2 mg/dL   GFR calc non Af Amer 45 (L) >60 mL/min   GFR calc Af Amer 52 (L) >60 mL/min   Anion gap 12 5 - 15    Comment: Performed at [facility], [address] [place], [place] [address]  CBC WITH DIFFERENTIAL     Status: Abnormal   Collection Time: 02/20/20  3:47 AM  Result Value Ref Range   WBC 4.8 4.0 - 10.5 K/uL   RBC 3.45 (L) 3.87 - 5.11 MIL/uL   Hemoglobin 10.2 (L) 12.0 - 15.0 g/dL   HCT 32.3 (L) 36.0 - 46.0 %   MCV 93.6 80.0 - 100.0 fL   MCH 29.6 26.0 - 34.0 pg   MCHC 31.6 30.0 - 36.0 g/dL   RDW 18.4 (H) 11.5 - 15.5 %   Platelets 123 (L) 150 - 400 K/uL   nRBC 0.6 (H) 0.0 - 0.2 %   Neutrophils Relative % 71 %   Neutro Abs 3.4 1.7 - 7.7 K/uL   Lymphocytes Relative 16 %   Lymphs Abs 0.7 0.7 - 4.0 K/uL   Monocytes Relative 12 %   Monocytes Absolute 0.6 0.1 - 1.0 K/uL   Eosinophils Relative 0 %   Eosinophils Absolute 0.0 0.0 - 0.5  K/uL   Basophils Relative 0 %   Basophils Absolute 0.0 0.0 - 0.1 K/uL   Immature Granulocytes 1 %   Abs Immature Granulocytes 0.04 0.00 - 0.07 K/uL    Comment: Performed at [facility], [address] [place], [place] [address]  Vitamin B12     Status: None   Collection Time: 02/20/20  3:47 AM  Result Value Ref Range   Vitamin B-12 902 180 - 914 pg/mL    Comment: (NOTE) This assay is not validated for testing neonatal or myeloproliferative syndrome specimens for Vitamin B12 levels. Performed at [facility], [address], [place],  [place] [address]    CT Head Wo Contrast  Result Date: 02/19/2020 CLINICAL DATA:  Right-sided tooth abscess, generalized weakness, fell EXAM: CT HEAD WITHOUT CONTRAST TECHNIQUE: Contiguous axial images were obtained from the base of the skull through the vertex without intravenous contrast. COMPARISON:  None. FINDINGS: Brain: No acute infarct or hemorrhage. Lateral ventricles and midline structures are unremarkable. No acute extra-axial fluid collections. No mass effect. Vascular: No hyperdense vessel or unexpected calcification. Skull: Normal. Negative for fracture or focal lesion. Sinuses/Orbits: No acute finding. Other: None IMPRESSION: 1. No acute intracranial process. Electronically Signed   By: Randa Ngo M.D.   On: 02/19/2020 19:16   CT MAXILLOFACIAL W CONTRAST  Result Date: 02/20/2020 CLINICAL DATA:  Initial evaluation for right-sided facial swelling, abscess tooth. EXAM: CT MAXILLOFACIAL WITH CONTRAST TECHNIQUE: Multidetector CT imaging of the maxillofacial structures was performed with intravenous contrast. Multiplanar CT image reconstructions were also generated. CONTRAST:  49mL OMNIPAQUE IOHEXOL 300 MG/ML  SOLN COMPARISON:  None available. FINDINGS: Osseous: Examination mildly degraded by motion artifact. No acute fracture seen about the facial bones. Remote posttraumatic defect noted at the right lamina papyracea with medial is a shin of a small  portion of the intraorbital fat. No discrete osseous lesions. Orbits: Globes and orbital soft tissues within normal limits. No evidence for postseptal or intraorbital cellulitis. Sinuses: Mild-to-moderate mucoperiosteal thickening noted within the right maxillary sinus. Paranasal sinuses are otherwise clear. Mastoid air cells and middle ear cavities are well pneumatized and free of fluid. Soft tissues: Asymmetric soft tissue swelling with inflammatory stranding seen adjacent to the right mandibular body, concerning for acute infection/cellulitis. Innumerable dental caries with periapical lucency seen about the remaining dentition, likely reflecting the source of infection. There is a more focal area of swelling with soft tissue stranding seen involving the right submental region, likely reflecting phlegmon (series 6, image 39). Changes extend to the overlying skin. No definite discrete abscess or drainable fluid collection at this time. Mild edema seen adjacent to the right mandible without definite discrete odontogenic abscess. There is asymmetric sclerosis with cortical thickening and periosteal reaction seen about the right mandibular body, extending from the parasymphyseal region to approximately the angle of the mandible, suggesting associated osteomyelitis (series 3, image 68). Limited intracranial: Unremarkable. IMPRESSION: 1. Soft tissue swelling with inflammatory stranding adjacent to the right mandible, concerning for acute infection/cellulitis. A more focal area of phlegmon involves the right submental region. No definite discrete abscess or drainable fluid collection. Underlying poor dentition suggests an odontogenic source. 2. Asymmetric sclerosis with periosteal reaction involving the right mandibular body, suggesting associated osteomyelitis. Electronically Signed   By: Jeannine Boga M.D.   On: 02/20/2020 01:50    Pending Labs Unresulted Labs (From admission, onward)    Start     Ordered    02/26/20 0500  Creatinine, serum  (enoxaparin (LOVENOX)    CrCl >/= 30 ml/min)  Weekly,   R    Comments: while on enoxaparin therapy    02/19/20 2250   02/20/20 0513  Hemoglobin A1c  Add-on,   AD    Comments: To assess prior glycemic control    02/20/20 0513   02/20/20 0500  RPR  Tomorrow morning,   R     02/20/20 0329   02/20/20 0500  HIV Antibody (routine testing w rflx)  (HIV Antibody (Routine testing w reflex) panel)  Once,   STAT     02/20/20 0329   02/19/20 2324  Aerobic/Anaerobic Culture (surgical/deep wound)  Once,  STAT    Question:  Patient immune status  Answer:  Immunocompromised   02/19/20 2326   02/19/20 2221  SARS CORONAVIRUS 2 (TAT 6-24 HRS) Nasopharyngeal Nasopharyngeal Swab  (Tier 3 (TAT 6-24 hrs))  Once,   STAT    Question Answer Comment  Is this test for diagnosis or screening Screening   Symptomatic for COVID-19 as defined by CDC No   Hospitalized for COVID-19 No   Admitted to ICU for COVID-19 No   Previously tested for COVID-19 No   Resident in a congregate (group) care setting No   Employed in healthcare setting No   Pregnant No      02/19/20 2220   02/19/20 2206  Urine Culture  ONCE - STAT,   STAT     02/19/20 2205          Vitals/Pain Today's Vitals   02/20/20 0230 02/20/20 0300 02/20/20 0400 02/20/20 0500  BP: (!) 117/56 136/60 117/62 116/60  Pulse: (!) 56 (!) 56 (!) 53 61  Resp: 13 16 16 13   Temp:      TempSrc:      SpO2: 99% 100% 99% 98%  Weight:      Height:      PainSc:        Isolation Precautions No active isolations  Medications Medications  enoxaparin (LOVENOX) injection 40 mg (40 mg Subcutaneous Given 02/19/20 2309)  acetaminophen (TYLENOL) tablet 650 mg (has no administration in time range)    Or  acetaminophen (TYLENOL) suppository 650 mg (has no administration in time range)  prochlorperazine (COMPAZINE) injection 5 mg (has no administration in time range)  phosphorus (K PHOS NEUTRAL) tablet 500 mg (500 mg Oral Given  02/19/20 2335)  piperacillin-tazobactam (ZOSYN) IVPB 3.375 g (has no administration in time range)  0.9 % NaCl with KCl 40 mEq / L  infusion (88 mL/hr Intravenous New Bag/Given 02/20/20 0521)  insulin aspart (novoLOG) injection 0-15 Units (has no administration in time range)  LORazepam (ATIVAN) injection 0.5 mg (has no administration in time range)  sodium chloride 0.9 % bolus 1,000 mL (0 mLs Intravenous Stopped 02/19/20 2129)  potassium chloride SA (KLOR-CON) CR tablet 40 mEq (40 mEq Oral Given 02/19/20 2112)  potassium chloride 10 mEq in 100 mL IVPB (0 mEq Intravenous Stopped 02/19/20 2225)  potassium chloride 10 mEq in 100 mL IVPB (0 mEq Intravenous Stopped 02/19/20 2225)  cefTRIAXone (ROCEPHIN) 1 g in sodium chloride 0.9 % 100 mL IVPB (0 g Intravenous Stopped 02/19/20 2311)  magnesium sulfate IVPB 2 g 50 mL (0 g Intravenous Stopped 02/20/20 0006)  piperacillin-tazobactam (ZOSYN) IVPB 3.375 g (0 g Intravenous Stopped 02/20/20 0043)  vancomycin (VANCOCIN) IVPB 1000 mg/200 mL premix (0 mg Intravenous Stopped 02/20/20 0142)  iohexol (OMNIPAQUE) 300 MG/ML solution 50 mL (50 mLs Intravenous Contrast Given 02/20/20 0057)  potassium chloride SA (KLOR-CON) CR tablet 40 mEq (40 mEq Oral Given 02/20/20 0521)    Mobility walks High fall risk   Focused Assessments    R Recommendations: See Admitting Provider Note  Report given to:   Additional Notes: Pt is confused at times and is high fall risk due to shuffled gait.

## 2020-02-20 NOTE — Progress Notes (Signed)
Report called to Gerilyn Nestle at [facility].  Blood glucose was 58 and gave D50 amp and now 167 and drinking boost.

## 2020-02-20 NOTE — Progress Notes (Signed)
CRITICAL VALUE ALERT  Critical Value:  Phosphorous 1.0  Date & Time Notified:  02/20/20 1425  Provider Notified: Text paged Dr. Maren Beach via [place].   Orders Received/Actions taken: See new orders placed.

## 2020-02-20 NOTE — Progress Notes (Signed)
PROGRESS NOTE    Brittany Archer [facility]  NLZ:[id number] DOB: Oct 18, 1941 DOA: 02/19/2020 PCP: Vesta Mixer   Brief Narrative: 79 year old female with history of left breast cancer, CAD, T2DM, HTN to the ER with worsening pain in addition to her abscessed tooth going on for 2 weeks with pain generalized weakness and also had a fall prior to admission. In the ED vitals are stable, UA WBC 11-20, CBC with WBC count 5.1, hemoglobin 12.1 g/dL and platelets 131.  CMP shows a potassium of 2.5 and CO2 of 18 mmol/L.  Her anion gap is 14.  Glucose 140, BUN 38 and creatinine 1.45 mg/dL.  Total protein is 6.7 albumin 3.2 g/dL.  The rest of the hepatic functions are unremarkable.  Both magnesium and phosphorus were 1.3 mg/dL.   Imaging: CT head did not show any acute intracranial abnormality.  Maxillofacial CT was ordered and showed findings suspicious of osteomyelitis.  Patient was started on vancomycin and Zosyn and was admitted.Spoke with her sister-who states it started last month.She is on chemo for multiple myeloma.  Subjective: Seen in the ED this morning. Resting, feels some better. No chest pain, nausea, vomitting Awaiting MRI this am Potassium 2.7 this morning COVID-19 from admission pending  Assessment & Plan:  Mandibular  Acute Osteomyelitis/Facial cellulitis/abscess with CT findings in "right mandible, concerning for acute infection/cellulitis. A more focal area of phlegmon involves the right submental region. No definite discrete abscess or drainable fluid collection. Underlying poor dentition suggests an odontogenic source".  Wound culture sent and patient was admitted on vancomycin/Zosyn pharmacy dosing.  Facial MRI with and without contrast ordered for better characterization.  PICC line has been ordered in anticipation for extended IV antibiotics.  Follow-up clinical course if no improvement will transfer to [facility] for MCS evaluation/debridement.MRI report came back-I reviewed and I have  called Dr. [name] on-call oral surgeon and discussed-he reviewed her CT scan and MRI and advises IV antibiotics and will also need debridement and also advised to hold Xgeva.  Patient will need to be transferred to [facility] or [facility]  for oral surgery evaluation and  Debridement.  Discussed with patient and patient's sister and both agreed to transfer.  Multiple Myeloma on Xgeva q 28 days and Velcade weekly. Her xgeva is likely contributing to her osteomyelitis.We will hold her Xgeva per oral surgery recommendation.Will need to discuss    Hypophosphatemia/Hypomagnesemia/hypokalemia:Again repleting p.o. and IV with repeat chemistry panel later today.   Type 2 diabetes mellitus:on Metformin/glipizide at home-which we will hold, continue sliding scale insulin.  Check hemoglobin A1c.  Coronary artery disease:No chest pain continue her aspirin.  Hypertension on lisinopril/HCTZ at home-blood pressure is controlled.  We will hold for now in the setting of #1 and start IV antihypertensive if blood pressure is uncontrolled.  Stage 3b chronic kidney disease/metabolic acidosis:Monitor renal function closely .bicarb is 18.  Continue gentle IV hydration while on vancomycin.  Reported baseline confusion per family communication to staff.  Supportive care follow-up precaution PT evaluation once stable.  Body mass index is 25.96 kg/m.   DVT prophylaxis:lovenox. Code Status:FULL  Family Communication:plan of care discussed with patient's sister over the phone-regarding her serious infection in the jaw and need for further transfer to [facility], pt also agreed.  Disposition Plan: Patient is from:home Anticipated Disposition: to home,in TBD Barriers to discharge or conditions that needs to be met prior to discharge: remains hospitalized for iv antibiotics, further management of her infection.Patient will need transfer to [facility] or [place]  [facility].   Consultants: Oral surgery Dr [name] Procedures:see note MRI  Face Inflammatory changes along the buccal and lingual aspects of the body and angle of the mandible on the right. There is abnormal signal of the adjacent mandible corresponding to sclerosis on CT likely reflecting sequelae of chronic inflammation. Mild marrow enhancement at the level of the mandibular foramen could reflect an area of more acute osteomyelitis.  More focal soft tissue thickening and fluid in the right submental region likely reflecting phlegmon/developing multilocular abscess  Microbiology: Wound culture sent from ER- pending.  Medications: Scheduled Meds:  acyclovir  400 mg Oral BID   aspirin  81 mg Oral Daily   enoxaparin (LOVENOX) injection  40 mg Subcutaneous Q24H   glipiZIDE  5 mg Oral QAC breakfast   lisinopril  20 mg Oral Daily   And   hydrochlorothiazide  25 mg Oral Daily   insulin aspart  0-15 Units Subcutaneous TID WC   metFORMIN  1,000 mg Oral q12n4p   phosphorus  500 mg Oral QID   [START ON 02/21/2020] potassium chloride SA  40 mEq Oral BID   Continuous Infusions:  0.9 % NaCl with KCl 40 mEq / L 88 mL/hr (02/20/20 0521)   piperacillin-tazobactam     Antimicrobials: Anti-infectives (From admission, onward)   Start     Dose/Rate Route Frequency Ordered Stop   02/20/20 1000  acyclovir (ZOVIRAX) tablet 400 mg     400 mg Oral 2 times daily 02/20/20 0630     02/20/20 0800  piperacillin-tazobactam (ZOSYN) IVPB 3.375 g     3.375 g 12.5 mL/hr over 240 Minutes Intravenous Every 8 hours 02/20/20 0422     02/19/20 2330  piperacillin-tazobactam (ZOSYN) IVPB 3.375 g     3.375 g 100 mL/hr over 30 Minutes Intravenous  Once 02/19/20 2327 02/20/20 0043   02/19/20 2330  vancomycin (VANCOCIN) IVPB 1000 mg/200 mL premix     1,000 mg 200 mL/hr over 60 Minutes Intravenous  Once 02/19/20 2327 02/20/20 0142   02/19/20 2215  cefTRIAXone (ROCEPHIN) 1 g in sodium chloride 0.9 % 100 mL IVPB     1 g 200 mL/hr over 30 Minutes Intravenous  Once 02/19/20 2205  02/19/20 2311     Objective: Vitals: Today's Vitals   02/20/20 0230 02/20/20 0300 02/20/20 0400 02/20/20 0500  BP: (!) 117/56 136/60 117/62 116/60  Pulse: (!) 56 (!) 56 (!) 53 61  Resp: '13 16 16 13  ' Temp:      TempSrc:      SpO2: 99% 100% 99% 98%  Weight:      Height:      PainSc:        Intake/Output Summary (Last 24 hours) at 02/20/2020 0736 Last data filed at 02/19/2020 2225 Gross per 24 hour  Intake 1200 ml  Output --  Net 1200 ml   Filed Weights   02/19/20 1828  Weight: 70.8 kg   Weight change:    Intake/Output from previous day: 02/22 0701 - 02/23 0700 In: 1200 [IV Piggyback:1200] Out: -  Intake/Output this shift: No intake/output data recorded.  Examination:  General exam: AAOx3 ,NAD, weak appearing. HEENT:Oral mucosa moist, Ear/Nose WNL grossly,dentition normal. Respiratory system: bilaterally clear,no wheezing or crackles,no use of accessory muscle, non tender. Cardiovascular system: S1 & S2 +, regular, No JVD. Gastrointestinal system: Abdomen soft, NT,ND, BS+. Nervous System:Alert, awake, moving extremities and grossly nonfocal Extremities: No edema, distal peripheral pulses palpable.  Skin: No rashes,no icterus. MSK: Normal muscle bulk,tone, power  Lower jaw/chin area with small area of pus discharges/tenderness, able to open mouth uvula visible, tongue normal.  Data Reviewed: I have personally reviewed following labs and imaging studies CBC: Recent Labs  Lab 02/14/20 0919 02/19/20 1930 02/20/20 0347  WBC 6.0 5.1 4.8  NEUTROABS 4.9 4.0 3.4  HGB 11.0* 12.1 10.2*  HCT 35.3* 37.4 32.3*  MCV 95.7 91.4 93.6  PLT 212 131* 384*   Basic Metabolic Panel: Recent Labs  Lab 02/14/20 0919 02/19/20 1930 02/20/20 0347  NA 138 138 138  K 2.1* 2.5* 2.7*  CL 100 106 108  CO2 27 18* 18*  GLUCOSE 189* 140* 144*  BUN 23 38* 28*  CREATININE 1.23* 1.45* 1.15*  CALCIUM 8.3* 8.2* 7.6*  MG  --  1.3*  --   PHOS  --  1.3*  --    GFR: Estimated Creatinine  Clearance: 39.1 mL/min (A) (by C-G formula based on SCr of 1.15 mg/dL (H)). Liver Function Tests: Recent Labs  Lab 02/14/20 0919 02/19/20 1930 02/20/20 0347  AST 10* 14* 13*  ALT '8 10 8  ' ALKPHOS 82 75 65  BILITOT 1.1 0.9 0.8  PROT 6.8 6.7 6.0*  ALBUMIN 3.2* 3.2* 2.8*   No results for input(s): LIPASE, AMYLASE in the last 168 hours. No results for input(s): AMMONIA in the last 168 hours. Coagulation Profile: No results for input(s): INR, PROTIME in the last 168 hours. Cardiac Enzymes: No results for input(s): CKTOTAL, CKMB, CKMBINDEX, TROPONINI in the last 168 hours. BNP (last 3 results) No results for input(s): PROBNP in the last 8760 hours. HbA1C: No results for input(s): HGBA1C in the last 72 hours. CBG: No results for input(s): GLUCAP in the last 168 hours. Lipid Profile: No results for input(s): CHOL, HDL, LDLCALC, TRIG, CHOLHDL, LDLDIRECT in the last 72 hours. Thyroid Function Tests: Recent Labs    02/19/20 1930  TSH 0.548   Anemia Panel: Recent Labs    02/20/20 0347  VITAMINB12 902   Sepsis Labs: No results for input(s): PROCALCITON, LATICACIDVEN in the last 168 hours.  No results found for this or any previous visit (from the past 240 hour(s)).    Radiology Studies: CT Head Wo Contrast  Result Date: 02/19/2020 CLINICAL DATA:  Right-sided tooth abscess, generalized weakness, fell EXAM: CT HEAD WITHOUT CONTRAST TECHNIQUE: Contiguous axial images were obtained from the base of the skull through the vertex without intravenous contrast. COMPARISON:  None. FINDINGS: Brain: No acute infarct or hemorrhage. Lateral ventricles and midline structures are unremarkable. No acute extra-axial fluid collections. No mass effect. Vascular: No hyperdense vessel or unexpected calcification. Skull: Normal. Negative for fracture or focal lesion. Sinuses/Orbits: No acute finding. Other: None IMPRESSION: 1. No acute intracranial process. Electronically Signed   By: Randa Ngo M.D.    On: 02/19/2020 19:16   CT MAXILLOFACIAL W CONTRAST  Result Date: 02/20/2020 CLINICAL DATA:  Initial evaluation for right-sided facial swelling, abscess tooth. EXAM: CT MAXILLOFACIAL WITH CONTRAST TECHNIQUE: Multidetector CT imaging of the maxillofacial structures was performed with intravenous contrast. Multiplanar CT image reconstructions were also generated. CONTRAST:  62m OMNIPAQUE IOHEXOL 300 MG/ML  SOLN COMPARISON:  None available. FINDINGS: Osseous: Examination mildly degraded by motion artifact. No acute fracture seen about the facial bones. Remote posttraumatic defect noted at the right lamina papyracea with medial is a shin of a small portion of the intraorbital fat. No discrete osseous lesions. Orbits: Globes and orbital soft tissues within normal limits. No evidence for postseptal or intraorbital cellulitis. Sinuses: Mild-to-moderate mucoperiosteal thickening  noted within the right maxillary sinus. Paranasal sinuses are otherwise clear. Mastoid air cells and middle ear cavities are well pneumatized and free of fluid. Soft tissues: Asymmetric soft tissue swelling with inflammatory stranding seen adjacent to the right mandibular body, concerning for acute infection/cellulitis. Innumerable dental caries with periapical lucency seen about the remaining dentition, likely reflecting the source of infection. There is a more focal area of swelling with soft tissue stranding seen involving the right submental region, likely reflecting phlegmon (series 6, image 39). Changes extend to the overlying skin. No definite discrete abscess or drainable fluid collection at this time. Mild edema seen adjacent to the right mandible without definite discrete odontogenic abscess. There is asymmetric sclerosis with cortical thickening and periosteal reaction seen about the right mandibular body, extending from the parasymphyseal region to approximately the angle of the mandible, suggesting associated osteomyelitis (series  3, image 68). Limited intracranial: Unremarkable. IMPRESSION: 1. Soft tissue swelling with inflammatory stranding adjacent to the right mandible, concerning for acute infection/cellulitis. A more focal area of phlegmon involves the right submental region. No definite discrete abscess or drainable fluid collection. Underlying poor dentition suggests an odontogenic source. 2. Asymmetric sclerosis with periosteal reaction involving the right mandibular body, suggesting associated osteomyelitis. Electronically Signed   By: Jeannine Boga M.D.   On: 02/20/2020 01:50     LOS: 1 day   Time spent: More than 50% of that time was spent in counseling and/or coordination of care.  Antonieta Pert, MD [facility]  02/20/2020, 7:36 AM

## 2020-02-20 NOTE — Progress Notes (Signed)
Pharmacy Antibiotic Note  Brittany Archer is a 79 y.o. female admitted on 02/19/2020 with cellulitis.  Pharmacy has been consulted for Vancomycin and Zosyn dosing.  Plan: Vancomycin 750 mg IV every 24 hours.  Goal trough 10-15 mcg/mL. Zosyn 3.375g IV q8h (4 hour infusion).  Monitor labs, c/s, and vanco levels as indicated  Height: 5\' 5"  (165.1 cm) Weight: 156 lb (70.8 kg) IBW/kg (Calculated) : 57  Temp (24hrs), Avg:97.7 F (36.5 C), Min:97.6 F (36.4 C), Max:97.7 F (36.5 C)  Recent Labs  Lab 02/14/20 0919 02/19/20 1930 02/20/20 0347  WBC 6.0 5.1 4.8  CREATININE 1.23* 1.45* 1.15*    Estimated Creatinine Clearance: 39.1 mL/min (A) (by C-G formula based on SCr of 1.15 mg/dL (H)).    Allergies  Allergen Reactions  . Motrin [Ibuprofen] Rash    Antimicrobials this admission: Vanco 2/23 >>  Zosyn 2/23 >>   Dose adjustments this admission: [place]  Microbiology results: 2/22 UCx: pending   2/22 wound Cx: pending  Thank you for allowing pharmacy to be a part of this patient's care.  Margot Ables, PharmD Clinical Pharmacist 02/20/2020 7:44 AM

## 2020-02-20 NOTE — Progress Notes (Signed)
Hypoglycemic Event  CBG: 52 at 2331  Treatment: Given chocolate pudding and soda  Symptoms: Patient asymptomatic.  Follow-up CBG: Time: 0013 CBG Result: 125  Possible Reasons for Event: Unknown  RN will continue to monitor and spot-check CBG PRN. Patient call bell within reach and bed in lowest position with bed alarm in use.   [place]

## 2020-02-20 NOTE — ED Notes (Signed)
Pt is confused, she doesn't remember going to restroom or know why she is here. Admitting MD notified.

## 2020-02-20 NOTE — Progress Notes (Signed)
Consult request has been received. CSW attempting to follow up at present time  Keauna Brasel M. Kiptyn Rafuse LCSWA Transitions of Care  Clinical Social Worker  Ph: [phone] 

## 2020-02-20 NOTE — ED Notes (Signed)
Date and time results received: 02/20/20 0430 (use smartphrase ".now" to insert current time)  Test: potassium Critical Value: 2.7  Name of Provider Notified: Dr Olevia Bowens  Orders Received? Or Actions Taken?: Actions Taken: orders received

## 2020-02-21 ENCOUNTER — Inpatient Hospital Stay: Payer: Self-pay

## 2020-02-21 DIAGNOSIS — M869 Osteomyelitis, unspecified: Secondary | ICD-10-CM

## 2020-02-21 DIAGNOSIS — N1832 Chronic kidney disease, stage 3b: Secondary | ICD-10-CM

## 2020-02-21 DIAGNOSIS — E119 Type 2 diabetes mellitus without complications: Secondary | ICD-10-CM

## 2020-02-21 LAB — GLUCOSE, CAPILLARY
Glucose-Capillary: 121 mg/dL — ABNORMAL HIGH (ref 70–99)
Glucose-Capillary: 125 mg/dL — ABNORMAL HIGH (ref 70–99)
Glucose-Capillary: 179 mg/dL — ABNORMAL HIGH (ref 70–99)
Glucose-Capillary: 61 mg/dL — ABNORMAL LOW (ref 70–99)
Glucose-Capillary: 63 mg/dL — ABNORMAL LOW (ref 70–99)
Glucose-Capillary: 73 mg/dL (ref 70–99)
Glucose-Capillary: 95 mg/dL (ref 70–99)

## 2020-02-21 LAB — COMPREHENSIVE METABOLIC PANEL
ALT: 7 U/L (ref 0–44)
AST: 10 U/L — ABNORMAL LOW (ref 15–41)
Albumin: 2.5 g/dL — ABNORMAL LOW (ref 3.5–5.0)
Alkaline Phosphatase: 59 U/L (ref 38–126)
Anion gap: 11 (ref 5–15)
BUN: 13 mg/dL (ref 8–23)
CO2: 18 mmol/L — ABNORMAL LOW (ref 22–32)
Calcium: 7.2 mg/dL — ABNORMAL LOW (ref 8.9–10.3)
Chloride: 110 mmol/L (ref 98–111)
Creatinine, Ser: 0.95 mg/dL (ref 0.44–1.00)
GFR calc Af Amer: >60 mL/min (ref >60)
GFR calc non Af Amer: 57 mL/min — ABNORMAL LOW (ref >60)
Glucose, Bld: 126 mg/dL — ABNORMAL HIGH (ref 70–99)
Potassium: 2.8 mmol/L — ABNORMAL LOW (ref 3.5–5.1)
Sodium: 139 mmol/L (ref 135–145)
Total Bilirubin: 1 mg/dL (ref 0.3–1.2)
Total Protein: 5.6 g/dL — ABNORMAL LOW (ref 6.5–8.1)

## 2020-02-21 LAB — URINE CULTURE

## 2020-02-21 LAB — MAGNESIUM: Magnesium: 1.3 mg/dL — ABNORMAL LOW (ref 1.7–2.4)

## 2020-02-21 LAB — PHOSPHORUS: Phosphorus: 2.4 mg/dL — ABNORMAL LOW (ref 2.5–4.6)

## 2020-02-21 MED ORDER — SODIUM CHLORIDE 0.9% FLUSH
10.0000 mL | INTRAVENOUS | Status: DC | PRN
  Administered 2020-02-23 – 2020-02-27 (×3): 10 mL

## 2020-02-21 MED ORDER — POTASSIUM PHOSPHATES 15 MMOLE/5ML IV SOLN
30.0000 mmol | Freq: Once | INTRAVENOUS | Status: AC
  Administered 2020-02-21: 30 mmol via INTRAVENOUS
  Filled 2020-02-21: qty 10

## 2020-02-21 MED ORDER — DEXTROSE 10 % IV SOLN: INTRAVENOUS | Status: DC

## 2020-02-21 MED ORDER — CHLORHEXIDINE GLUCONATE CLOTH 2 % EX PADS
6.0000 | MEDICATED_PAD | Freq: Every day | CUTANEOUS | Status: DC
  Administered 2020-02-21 – 2020-02-27 (×6): 6 via TOPICAL

## 2020-02-21 MED ORDER — DEXTROSE 50 % IV SOLN
INTRAVENOUS | Status: AC
  Administered 2020-02-21: 06:00:00 50 mL via INTRAVENOUS
  Filled 2020-02-21: qty 50

## 2020-02-21 MED ORDER — SODIUM CHLORIDE 0.9% FLUSH
10.0000 mL | Freq: Two times a day (BID) | INTRAVENOUS | Status: DC
  Administered 2020-02-22 – 2020-02-27 (×5): 10 mL

## 2020-02-21 MED ORDER — POTASSIUM CHLORIDE 10 MEQ/100ML IV SOLN
10.0000 meq | INTRAVENOUS | Status: AC
  Administered 2020-02-21 (×4): 10 meq via INTRAVENOUS
  Filled 2020-02-21: qty 100

## 2020-02-21 MED ORDER — MAGNESIUM SULFATE 2 GM/50ML IV SOLN
2.0000 g | Freq: Once | INTRAVENOUS | Status: AC
  Administered 2020-02-21: 12:00:00 2 g via INTRAVENOUS
  Filled 2020-02-21: qty 50

## 2020-02-21 MED ORDER — SODIUM CHLORIDE 0.9 % IV SOLN: INTRAVENOUS | Status: AC

## 2020-02-21 NOTE — Progress Notes (Signed)
Hypoglycemic Event  CBG: 61 at 0527  Treatment: 1 amp/ 69mL of D50 via Pyxis override  Symptoms: Patient is asymptomatic  Follow-up CBG: Time: 0613 CBG Result: 179  Possible Reasons for Event: Unsure, no insulin received overnight. Patient is NPO, this is the second hypoglycemic episode overnight.   Comments/MD notified: Paged on-call provider at [phone]. New orders given for D10 continuous infusion, see MAR.  Patient remains asymptomatic. RN will continue to monitor. Patient call bell within reach and bed in lowest position with bed alarm in use.     [place]

## 2020-02-21 NOTE — Progress Notes (Addendum)
PROGRESS NOTE  Brittany [facility] WLS:[id number] DOB: October 10, 1941 DOA: 02/19/2020 PCP: [name], PA-C   LOS: 2 days   Brief narrative: As per HPI,  79 years old female with history of left breast cancer, CAD, T2DM, HTN presented to the ER with worsening pain in addition to her abscessed tooth going on for 2 weeks with pain generalized weakness and also had a fall prior to admission. In the ED vitals were stable, UA WBC 11-20, CBC with WBC count 5.1,hemoglobin 12.1 g/dL and platelets 131. CMP showed a potassium of 2.5 and CO2 of 18 mmol/L. Her anion gap was 14. Glucose 140, BUN 38 and creatinine 1.45 mg/dL. Total protein is 6.7 albumin 3.2 g/dL. CT head did not show any acute intracranial abnormality. Maxillofacial CT was ordered and showed findings suspicious of osteomyelitis.  Patient was started on vancomycin and Zosyn and was admitted to the hospital. She is on chemo for multiple myeloma.   Assessment/Plan:  Active Problems:   Hypokalemia   Type 2 diabetes mellitus (HCC)   Coronary artery disease   Hypertension   Stage 3b chronic kidney disease   Hypomagnesemia   Hypophosphatemia   Facial cellulitis   Confusion   Acute osteomyelitis of the mandible secondary to dental abscess with facial cellulitis.  CT/MRI scan shows phlegmon and osteomyelitis.  Wound culture sent since there was some active drainage.  On vancomycin and Zosyn at this time.  Oral surgery Dr. [name] was notified.  I also spoke with him today.  We will put the patient on clears today.  Possible debridement tomorrow.  Continue to hold Xgeva.  Follow cultures.  Multiple Myeloma on Xgeva q 28 days and Velcade weekly. Xgeva on hold after discussion with oral surgery.  Hypophosphatemia/Hypomagnesemia/hypokalemia: Continue to replenish IV.  Magnesium of 1.3 today.  Will replenish.  Type 2 diabetes mellitus Hold Metformin and glipizide.  Continue sliding scale insulin, Accu-Cheks, hemoglobin A1c of 7.6.  Had  hypoglycemia.  Required D10.  Coronary artery disease: On aspirin.  No acute chest pain.  Hypertension.  on lisinopril/HCTZ at home. IV antihypertensives as needed   Stage 3b chronic kidney disease.  Continue gentle IV fluid hydration,.  Currently on D10.  VTE Prophylaxis: lovenox subq  Code Status:  Full  Family Communication: Spoke with the patient at bedside.   Disposition Plan:  . Patient is from home . Likely disposition to home . Barriers to discharge: oral surgery intervention and followup, IV antibiotics   Consultants:  Oral surgery  Procedures:  None  Antibiotics:  . Vancomycin and Zosyn  Anti-infectives (From admission, onward)   Start     Dose/Rate Route Frequency Ordered Stop   02/21/20 0000  vancomycin (VANCOREADY) IVPB 750 mg/150 mL     750 mg 150 mL/hr over 60 Minutes Intravenous Every 24 hours 02/20/20 0742     02/20/20 1000  acyclovir (ZOVIRAX) tablet 400 mg     400 mg Oral 2 times daily 02/20/20 0630     02/20/20 0800  piperacillin-tazobactam (ZOSYN) IVPB 3.375 g     3.375 g 12.5 mL/hr over 240 Minutes Intravenous Every 8 hours 02/20/20 0422     02/19/20 2330  piperacillin-tazobactam (ZOSYN) IVPB 3.375 g     3.375 g 100 mL/hr over 30 Minutes Intravenous  Once 02/19/20 2327 02/20/20 0043   02/19/20 2330  vancomycin (VANCOCIN) IVPB 1000 mg/200 mL premix     1,000 mg 200 mL/hr over 60 Minutes Intravenous  Once 02/19/20 2327 02/20/20 0142  02/19/20 2215  cefTRIAXone (ROCEPHIN) 1 g in sodium chloride 0.9 % 100 mL IVPB     1 g 200 mL/hr over 30 Minutes Intravenous  Once 02/19/20 2205 02/19/20 2311       Subjective: Today, patient was seen and examined at bedside.  Complains of mild dental pain and discomfort.  No fever or chills.  Feels hungry.  Objective: Vitals:   02/20/20 2109 02/21/20 0530  BP: 122/69 (!) 109/51  Pulse: 78 71  Resp: 18 16  Temp: 97.9 F (36.6 C) (!) 97.5 F (36.4 C)  SpO2: 100% 95%    Intake/Output Summary (Last  24 hours) at 02/21/2020 1008 Last data filed at 02/21/2020 0700 Gross per 24 hour  Intake 2558.89 ml  Output --  Net 2558.89 ml   Filed Weights   02/19/20 1828 02/20/20 0928  Weight: 70.8 kg 59.7 kg   Body mass index is 21.9 kg/m.   Physical Exam: GENERAL: Patient is alert awake and oriented. Not in obvious distress. HENT: No scleral pallor or icterus. Pupils equally reactive to light. Oral mucosa is moist right eye phlegmon.  Right the submandibular area with induration and mild drainage and tenderness. NECK: is supple, no gross swelling noted. CHEST: Clear to auscultation. No crackles or wheezes.  Diminished breath sounds bilaterally. CVS: S1 and S2 heard, no murmur. Regular rate and rhythm.  ABDOMEN: Soft, non-tender, bowel sounds are present. EXTREMITIES: No edema. CNS: Cranial nerves are intact. No focal motor deficits. SKIN: warm and dry without rashes.  Data Review: I have personally reviewed the following laboratory data and studies,  CBC: Recent Labs  Lab 02/19/20 1930 02/20/20 0347  WBC 5.1 4.8  NEUTROABS 4.0 3.4  HGB 12.1 10.2*  HCT 37.4 32.3*  MCV 91.4 93.6  PLT 131* 748*   Basic Metabolic Panel: Recent Labs  Lab 02/19/20 1930 02/20/20 0347 02/20/20 1342 02/21/20 0749  NA 138 138 137 139  K 2.5* 2.7* 3.9 2.8*  CL 106 108 111 110  CO2 18* 18* 17* 18*  GLUCOSE 140* 144* 144* 126*  BUN 38* 28* 23 13  CREATININE 1.45* 1.15* 1.07* 0.95  CALCIUM 8.2* 7.6* 7.5* 7.2*  MG 1.3*  --   --  1.3*  PHOS 1.3*  --  1.0* 2.4*   Liver Function Tests: Recent Labs  Lab 02/19/20 1930 02/20/20 0347 02/21/20 0749  AST 14* 13* 10*  ALT '10 8 7  ' ALKPHOS 75 65 59  BILITOT 0.9 0.8 1.0  PROT 6.7 6.0* 5.6*  ALBUMIN 3.2* 2.8* 2.5*   No results for input(s): LIPASE, AMYLASE in the last 168 hours. No results for input(s): AMMONIA in the last 168 hours. Cardiac Enzymes: No results for input(s): CKTOTAL, CKMB, CKMBINDEX, TROPONINI in the last 168 hours. BNP (last 3  results) No results for input(s): BNP in the last 8760 hours.  ProBNP (last 3 results) No results for input(s): PROBNP in the last 8760 hours.  CBG: Recent Labs  Lab 02/20/20 2331 02/21/20 0013 02/21/20 0527 02/21/20 0613 02/21/20 0753  GLUCAP 52* 125* 61* 179* 121*   Recent Results (from the past 240 hour(s))  Urine Culture     Status: Abnormal   Collection Time: 02/19/20  9:40 PM   Specimen: Urine, Clean Catch  Result Value Ref Range Status   Specimen Description   Final    URINE, CLEAN CATCH Performed at [facility], [address], [place], [place] [address]    Special Requests   Final    NONE  Performed at [facility], [address], [place], [place] [address]    Culture MULTIPLE SPECIES PRESENT, SUGGEST RECOLLECTION (A)  Final   Report Status 02/21/2020 FINAL  Final  SARS CORONAVIRUS 2 (TAT 6-24 HRS) Nasopharyngeal Nasopharyngeal Swab     Status: None   Collection Time: 02/19/20 10:21 PM   Specimen: Nasopharyngeal Swab  Result Value Ref Range Status   SARS Coronavirus 2 NEGATIVE NEGATIVE Final    Comment: (NOTE) SARS-CoV-2 target nucleic acids are NOT DETECTED. The SARS-CoV-2 RNA is generally detectable in upper and lower respiratory specimens during the acute phase of infection. Negative results do not preclude SARS-CoV-2 infection, do not rule out co-infections with other pathogens, and should not be used as the sole basis for treatment or other patient management decisions. Negative results must be combined with clinical observations, patient history, and epidemiological information. The expected result is Negative. Fact Sheet for Patients: SugarRoll.be Fact Sheet for Healthcare Providers: https://www.woods-mathews.com/ This test is not yet approved or cleared by the [place] FDA and  has been authorized for detection and/or diagnosis of SARS-CoV-2 by FDA under an Emergency Use Authorization (EUA). This EUA will  remain  in effect (meaning this test can be used) for the duration of the COVID-19 declaration under Section 56 4(b)(1) of the Act, 21 U.S.C. section 360bbb-3(b)(1), unless the authorization is terminated or revoked sooner. Performed at [facility], [place] [address], [place], [place] [address]   Aerobic/Anaerobic Culture (surgical/deep wound)     Status: None (Preliminary result)   Collection Time: 02/19/20 11:24 PM   Specimen: Face  Result Value Ref Range Status   Specimen Description   Final    FACE Performed at [facility], [address], [place], [place] [address]    Special Requests   Final    Immunocompromised Performed at [facility], [address], [place], [place] [address]    Gram Stain   Final    FEW WBC PRESENT, PREDOMINANTLY PMN MODERATE GRAM POSITIVE COCCI IN CHAINS IN CLUSTERS RARE GRAM POSITIVE RODS    Culture   Final    CULTURE REINCUBATED FOR BETTER GROWTH Performed at [facility], [place] [address], [place], [place] [address]    Report Status PENDING  Incomplete     Studies: CT Head Wo Contrast  Result Date: 02/19/2020 CLINICAL DATA:  Right-sided tooth abscess, generalized weakness, fell EXAM: CT HEAD WITHOUT CONTRAST TECHNIQUE: Contiguous axial images were obtained from the base of the skull through the vertex without intravenous contrast. COMPARISON:  None. FINDINGS: Brain: No acute infarct or hemorrhage. Lateral ventricles and midline structures are unremarkable. No acute extra-axial fluid collections. No mass effect. Vascular: No hyperdense vessel or unexpected calcification. Skull: Normal. Negative for fracture or focal lesion. Sinuses/Orbits: No acute finding. Other: None IMPRESSION: 1. No acute intracranial process. Electronically Signed   By: Randa Ngo M.D.   On: 02/19/2020 19:16   CT MAXILLOFACIAL W CONTRAST  Result Date: 02/20/2020 CLINICAL DATA:  Initial evaluation for right-sided facial swelling, abscess tooth. EXAM: CT  MAXILLOFACIAL WITH CONTRAST TECHNIQUE: Multidetector CT imaging of the maxillofacial structures was performed with intravenous contrast. Multiplanar CT image reconstructions were also generated. CONTRAST:  64m OMNIPAQUE IOHEXOL 300 MG/ML  SOLN COMPARISON:  None available. FINDINGS: Osseous: Examination mildly degraded by motion artifact. No acute fracture seen about the facial bones. Remote posttraumatic defect noted at the right lamina papyracea with medial is a shin of a small portion of the intraorbital fat. No discrete osseous  lesions. Orbits: Globes and orbital soft tissues within normal limits. No evidence for postseptal or intraorbital cellulitis. Sinuses: Mild-to-moderate mucoperiosteal thickening noted within the right maxillary sinus. Paranasal sinuses are otherwise clear. Mastoid air cells and middle ear cavities are well pneumatized and free of fluid. Soft tissues: Asymmetric soft tissue swelling with inflammatory stranding seen adjacent to the right mandibular body, concerning for acute infection/cellulitis. Innumerable dental caries with periapical lucency seen about the remaining dentition, likely reflecting the source of infection. There is a more focal area of swelling with soft tissue stranding seen involving the right submental region, likely reflecting phlegmon (series 6, image 39). Changes extend to the overlying skin. No definite discrete abscess or drainable fluid collection at this time. Mild edema seen adjacent to the right mandible without definite discrete odontogenic abscess. There is asymmetric sclerosis with cortical thickening and periosteal reaction seen about the right mandibular body, extending from the parasymphyseal region to approximately the angle of the mandible, suggesting associated osteomyelitis (series 3, image 68). Limited intracranial: Unremarkable. IMPRESSION: 1. Soft tissue swelling with inflammatory stranding adjacent to the right mandible, concerning for acute  infection/cellulitis. A more focal area of phlegmon involves the right submental region. No definite discrete abscess or drainable fluid collection. Underlying poor dentition suggests an odontogenic source. 2. Asymmetric sclerosis with periosteal reaction involving the right mandibular body, suggesting associated osteomyelitis. Electronically Signed   By: Jeannine Boga M.D.   On: 02/20/2020 01:50   MR FACE/TRIGEMINAL WO/W CM  Result Date: 02/20/2020 CLINICAL DATA:  Left jaw pain and swelling EXAM: MRI FACE TRIGEMINAL WITHOUT AND WITH CONTRAST TECHNIQUE: Multiplanar, multiecho pulse sequences of the face and surrounding structures, including thin slice imaging of the course of the Trigeminal Nerves, were obtained both before and after administration of intravenous contrast. CONTRAST:  68m GADAVIST GADOBUTROL 1 MMOL/ML IV SOLN COMPARISON:  Maxillofacial CT earlier same day FINDINGS: Motion artifact is present. As seen on prior CT, there is asymmetric soft tissue thickening in the right submental region with overlying skin thickening. There is associated enhancement. This also involves the fat surrounding the right inferior alveolar nerve. Soft tissue thickening and enhancement are also present along the lingual and buccal aspects of the right mandibular body and angle. There is replacement of normal T1 marrow signal of the adjacent mandible. There is mild enhancement of the marrow at the level of the mandibular foramen (series 9, image 24). Dental disease is better evaluated on the CT. Limited intracranial imaging demonstrates no abnormal enhancement. Orbits are unremarkable. Mild paranasal sinus mucosal thickening. IMPRESSION: Inflammatory changes along the buccal and lingual aspects of the body and angle of the mandible on the right. There is abnormal signal of the adjacent mandible corresponding to sclerosis on CT likely reflecting sequelae of chronic inflammation. Mild marrow enhancement at the level of  the mandibular foramen could reflect an area of more acute osteomyelitis. More focal soft tissue thickening and fluid in the right submental region likely reflecting phlegmon/developing multilocular abscess. Electronically Signed   By: PMacy MisM.D.   On: 02/20/2020 09:06   [place]  Result Date: 02/21/2020 [facility], placement could not be confirmed due to current cardiac rhythm.     LFlora Lipps MD  [facility] 02/21/2020

## 2020-02-21 NOTE — Consult Note (Addendum)
Reason for Consult: mandibular osteomyelitis Referring Physician: [name] is an 79 y.o. female.  HPI: 79 y/o F with history of left breast cancer, CAD, T2DM, HTN presented to the ER with worsening pain in addition to her abscessed teeth/draining extraoral fistula that has be going on for 2 weeks with pain generalized weakness and also had a fall prior to admission. CT head did not show any acute intracranial abnormality. Maxillofacial CT was ordered and showed findings suspicious of osteomyelitis.Patient was started on vancomycin and Zosyn and was admitted to the hospital. She is on chemo for multiple myeloma to include Denosumab inj monthly. Oral & Maxillofacial Surgery consulted for her infection. Currently, she complains of lower jaw soreness and that she is hungry and tired.   Past Medical History:  Diagnosis Date  . Breast cancer ([place])    left breast/ 2008/ surg/ rad tx  . Coronary artery disease   . Diabetes mellitus     Past Surgical History:  Procedure Laterality Date  . ABDOMINAL HYSTERECTOMY    . BREAST SURGERY      Family History  Problem Relation Age of Onset  . Obesity Sister     Social History:  reports that she has never smoked. She has never used smokeless tobacco. She reports that she does not drink alcohol or use drugs.  Allergies:  Allergies  Allergen Reactions  . Motrin [Ibuprofen] Rash    Medications: I have reviewed the patient's current medications.  Results for orders placed or performed during the hospital encounter of 02/19/20 (from the past 48 hour(s))  CBC with Differential/Platelet     Status: Abnormal   Collection Time: 02/19/20  7:30 PM  Result Value Ref Range   WBC 5.1 4.0 - 10.5 K/uL   RBC 4.09 3.87 - 5.11 MIL/uL   Hemoglobin 12.1 12.0 - 15.0 g/dL   HCT 37.4 36.0 - 46.0 %   MCV 91.4 80.0 - 100.0 fL   MCH 29.6 26.0 - 34.0 pg   MCHC 32.4 30.0 - 36.0 g/dL   RDW 18.0 (H) 11.5 - 15.5 %   Platelets 131 (L) 150 - 400  K/uL   nRBC 1.6 (H) 0.0 - 0.2 %   Neutrophils Relative % 79 %   Neutro Abs 4.0 1.7 - 7.7 K/uL   Lymphocytes Relative 9 %   Lymphs Abs 0.5 (L) 0.7 - 4.0 K/uL   Monocytes Relative 11 %   Monocytes Absolute 0.6 0.1 - 1.0 K/uL   Eosinophils Relative 0 %   Eosinophils Absolute 0.0 0.0 - 0.5 K/uL   Basophils Relative 0 %   Basophils Absolute 0.0 0.0 - 0.1 K/uL   Immature Granulocytes 1 %   Abs Immature Granulocytes 0.03 0.00 - 0.07 K/uL    Comment: Performed at [facility], [address] [place], [place] [address]  Comprehensive metabolic panel     Status: Abnormal   Collection Time: 02/19/20  7:30 PM  Result Value Ref Range   Sodium 138 135 - 145 mmol/L   Potassium 2.5 (LL) 3.5 - 5.1 mmol/L    Comment: CRITICAL RESULT CALLED TO, READ BACK BY AND VERIFIED WITH: WESTON,L. AT 2033 ON 02/19/2020 BY EVA    Chloride 106 98 - 111 mmol/L   CO2 18 (L) 22 - 32 mmol/L   Glucose, Bld 140 (H) 70 - 99 mg/dL   BUN 38 (H) 8 - 23 mg/dL   Creatinine, Ser 1.45 (H) 0.44 - 1.00 mg/dL   Calcium 8.2 (  L) 8.9 - 10.3 mg/dL   Total Protein 6.7 6.5 - 8.1 g/dL   Albumin 3.2 (L) 3.5 - 5.0 g/dL   AST 14 (L) 15 - 41 U/L   ALT 10 0 - 44 U/L   Alkaline Phosphatase 75 38 - 126 U/L   Total Bilirubin 0.9 0.3 - 1.2 mg/dL   GFR calc non Af Amer 34 (L) >60 mL/min   GFR calc Af Amer 40 (L) >60 mL/min   Anion gap 14 5 - 15    Comment: Performed at [facility], [address], [place], [place] [address]  Magnesium     Status: Abnormal   Collection Time: 02/19/20  7:30 PM  Result Value Ref Range   Magnesium 1.3 (L) 1.7 - 2.4 mg/dL    Comment: Performed at [facility], [address], [place], [place] [address]  Phosphorus     Status: Abnormal   Collection Time: 02/19/20  7:30 PM  Result Value Ref Range   Phosphorus 1.3 (L) 2.5 - 4.6 mg/dL    Comment: Performed at [facility], [address] [place], [place] [address]  TSH     Status: None   Collection Time: 02/19/20  7:30 PM  Result Value Ref Range   TSH 0.548  0.350 - 4.500 uIU/mL    Comment: Performed by a 3rd Generation assay with a functional sensitivity of <=0.01 uIU/mL. Performed at [facility], [address], [place], [place] [address]   Rapid HIV screen (HIV 1/2 Ab+Ag) ([place] Only)     Status: None   Collection Time: 02/19/20  7:30 PM  Result Value Ref Range   HIV-1 P24 Antigen - HIV24 NON REACTIVE NON REACTIVE    Comment: (NOTE) Detection of p24 may be inhibited by biotin in the sample, causing false negative results in acute infection.    HIV 1/2 Antibodies NON REACTIVE NON REACTIVE   Interpretation (HIV Ag Ab)      A non reactive test result means that HIV 1 or HIV 2 antibodies and HIV 1 p24 antigen were not detected in the specimen.    Comment: Performed at [facility], [address], [place], [place] [address]  Urinalysis, Routine w reflex microscopic     Status: Abnormal   Collection Time: 02/19/20  9:40 PM  Result Value Ref Range   Color, Urine YELLOW YELLOW   APPearance HAZY (A) CLEAR   Specific Gravity, Urine 1.013 1.005 - 1.030   pH 5.0 5.0 - 8.0   Glucose, UA NEGATIVE NEGATIVE mg/dL   Hgb urine dipstick LARGE (A) NEGATIVE   Bilirubin Urine NEGATIVE NEGATIVE   Ketones, ur NEGATIVE NEGATIVE mg/dL   Protein, ur NEGATIVE NEGATIVE mg/dL   Nitrite NEGATIVE NEGATIVE   Leukocytes,Ua NEGATIVE NEGATIVE   RBC / HPF >50 (H) 0 - 5 RBC/hpf   WBC, UA 11-20 0 - 5 WBC/hpf   Bacteria, UA MANY (A) NONE SEEN   Squamous Epithelial / LPF 0-5 0 - 5   Hyaline Casts, UA PRESENT     Comment: Performed at [facility], [address], [place], [place] [address]  Urine Culture     Status: Abnormal   Collection Time: 02/19/20  9:40 PM   Specimen: Urine, Clean Catch  Result Value Ref Range   Specimen Description      URINE, CLEAN CATCH Performed at [facility], [address], [place], [place] [address]    Special Requests      NONE Performed at [facility], [place]  [address] [place], [place] [address]    Culture MULTIPLE SPECIES PRESENT,  SUGGEST RECOLLECTION (A)    Report Status 02/21/2020 FINAL   SARS CORONAVIRUS 2 (TAT 6-24 HRS) Nasopharyngeal Nasopharyngeal Swab     Status: None   Collection Time: 02/19/20 10:21 PM   Specimen: Nasopharyngeal Swab  Result Value Ref Range   SARS Coronavirus 2 NEGATIVE NEGATIVE    Comment: (NOTE) SARS-CoV-2 target nucleic acids are NOT DETECTED. The SARS-CoV-2 RNA is generally detectable in upper and lower respiratory specimens during the acute phase of infection. Negative results do not preclude SARS-CoV-2 infection, do not rule out co-infections with other pathogens, and should not be used as the sole basis for treatment or other patient management decisions. Negative results must be combined with clinical observations, patient history, and epidemiological information. The expected result is Negative. Fact Sheet for Patients: SugarRoll.be Fact Sheet for Healthcare Providers: https://www.woods-mathews.com/ This test is not yet approved or cleared by the [place] FDA and  has been authorized for detection and/or diagnosis of SARS-CoV-2 by FDA under an Emergency Use Authorization (EUA). This EUA will remain  in effect (meaning this test can be used) for the duration of the COVID-19 declaration under Section 56 4(b)(1) of the Act, 21 U.S.C. section 360bbb-3(b)(1), unless the authorization is terminated or revoked sooner. Performed at [facility], [place] [address] [place], [place] [address]   Aerobic/Anaerobic Culture (surgical/deep wound)     Status: None (Preliminary result)   Collection Time: 02/19/20 11:24 PM   Specimen: Face  Result Value Ref Range   Specimen Description      FACE Performed at [facility], [address] [place], [place] [address]    Special Requests      Immunocompromised Performed at [facility], [address], [place], [place] [address]    Gram Stain      FEW WBC PRESENT, PREDOMINANTLY PMN MODERATE  GRAM POSITIVE COCCI IN CHAINS IN CLUSTERS RARE GRAM POSITIVE RODS    Culture      RARE STAPHYLOCOCCUS LUGDUNENSIS CULTURE REINCUBATED FOR BETTER GROWTH Performed at [facility], [place] [address], [place], [place] [address]    Report Status PENDING   Comprehensive metabolic panel     Status: Abnormal   Collection Time: 02/20/20  3:47 AM  Result Value Ref Range   Sodium 138 135 - 145 mmol/L   Potassium 2.7 (LL) 3.5 - 5.1 mmol/L    Comment: CRITICAL RESULT CALLED TO, READ BACK BY AND VERIFIED WITH: BELTON,C AT 4:25AM ON 02/20/20 BY FESTERMAN,C    Chloride 108 98 - 111 mmol/L   CO2 18 (L) 22 - 32 mmol/L   Glucose, Bld 144 (H) 70 - 99 mg/dL   BUN 28 (H) 8 - 23 mg/dL   Creatinine, Ser 1.15 (H) 0.44 - 1.00 mg/dL   Calcium 7.6 (L) 8.9 - 10.3 mg/dL   Total Protein 6.0 (L) 6.5 - 8.1 g/dL   Albumin 2.8 (L) 3.5 - 5.0 g/dL   AST 13 (L) 15 - 41 U/L   ALT 8 0 - 44 U/L   Alkaline Phosphatase 65 38 - 126 U/L   Total Bilirubin 0.8 0.3 - 1.2 mg/dL   GFR calc non Af Amer 45 (L) >60 mL/min   GFR calc Af Amer 52 (L) >60 mL/min   Anion gap 12 5 - 15    Comment: Performed at [facility], [address], [place],  [address]  CBC WITH DIFFERENTIAL     Status: Abnormal  Collection Time: 02/20/20  3:47 AM  Result Value Ref Range   WBC 4.8 4.0 - 10.5 K/uL   RBC 3.45 (L) 3.87 - 5.11 MIL/uL   Hemoglobin 10.2 (L) 12.0 - 15.0 g/dL   HCT 32.3 (L) 36.0 - 46.0 %   MCV 93.6 80.0 - 100.0 fL   MCH 29.6 26.0 - 34.0 pg   MCHC 31.6 30.0 - 36.0 g/dL   RDW 18.4 (H) 11.5 - 15.5 %   Platelets 123 (L) 150 - 400 K/uL   nRBC 0.6 (H) 0.0 - 0.2 %   Neutrophils Relative % 71 %   Neutro Abs 3.4 1.7 - 7.7 K/uL   Lymphocytes Relative 16 %   Lymphs Abs 0.7 0.7 - 4.0 K/uL   Monocytes Relative 12 %   Monocytes Absolute 0.6 0.1 - 1.0 K/uL   Eosinophils Relative 0 %   Eosinophils Absolute 0.0 0.0 - 0.5 K/uL   Basophils Relative 0 %   Basophils Absolute 0.0 0.0 - 0.1 K/uL   Immature Granulocytes 1 %   Abs  Immature Granulocytes 0.04 0.00 - 0.07 K/uL    Comment: Performed at [facility], [address] [place], [place] [address]  Vitamin B12     Status: None   Collection Time: 02/20/20  3:47 AM  Result Value Ref Range   Vitamin B-12 902 180 - 914 pg/mL    Comment: (NOTE) This assay is not validated for testing neonatal or myeloproliferative syndrome specimens for Vitamin B12 levels. Performed at [facility], [address] [place], [place] [address]   RPR     Status: None   Collection Time: 02/20/20  3:47 AM  Result Value Ref Range   RPR Ser Ql NON REACTIVE NON REACTIVE    Comment: Performed at [place] [address], [place], [place] [address]  HIV Antibody (routine testing w rflx)     Status: None   Collection Time: 02/20/20  3:47 AM  Result Value Ref Range   HIV Screen 4th Generation wRfx NON REACTIVE NON REACTIVE    Comment: Performed at [place] [address], [place], [place] [address]  Hemoglobin A1c     Status: Abnormal   Collection Time: 02/20/20  3:47 AM  Result Value Ref Range   Hgb A1c MFr Bld 7.6 (H) 4.8 - 5.6 %    Comment: (NOTE) Pre diabetes:          5.7%-6.4% Diabetes:              >6.4% Glycemic control for   <7.0% adults with diabetes    Mean Plasma Glucose 171.42 mg/dL    Comment: Performed at [place] [address] [place], [place] [address]  Glucose, capillary     Status: Abnormal   Collection Time: 02/20/20  9:31 AM  Result Value Ref Range   Glucose-Capillary 128 (H) 70 - 99 mg/dL    Comment: Glucose reference range applies only to samples taken after fasting for at least 8 hours.  Glucose, capillary     Status: Abnormal   Collection Time: 02/20/20 11:17 AM  Result Value Ref Range   Glucose-Capillary 182 (H) 70 - 99 mg/dL    Comment: Glucose reference range applies only to samples taken after fasting for at least 8 hours.   Comment 1 QC Due   Basic metabolic panel     Status: Abnormal   Collection Time: 02/20/20   1:42 PM  Result Value Ref Range  Sodium 137 135 - 145 mmol/L   Potassium 3.9 3.5 - 5.1 mmol/L    Comment: DELTA CHECK NOTED   Chloride 111 98 - 111 mmol/L   CO2 17 (L) 22 - 32 mmol/L   Glucose, Bld 144 (H) 70 - 99 mg/dL    Comment: Glucose reference range applies only to samples taken after fasting for at least 8 hours.   BUN 23 8 - 23 mg/dL   Creatinine, Ser 1.07 (H) 0.44 - 1.00 mg/dL   Calcium 7.5 (L) 8.9 - 10.3 mg/dL   GFR calc non Af Amer 49 (L) >60 mL/min   GFR calc Af Amer 57 (L) >60 mL/min   Anion gap 9 5 - 15    Comment: Performed at [facility], [address] [place], [place] [address]  Phosphorus     Status: Abnormal   Collection Time: 02/20/20  1:42 PM  Result Value Ref Range   Phosphorus 1.0 (LL) 2.5 - 4.6 mg/dL    Comment: CRITICAL RESULT CALLED TO, READ BACK BY AND VERIFIED WITH: ROGERS,A AT 1425 ON 2.23.21 BY ISLEY,B Performed at [facility], [address] [place], [place] [address]   Glucose, capillary     Status: Abnormal   Collection Time: 02/20/20  4:19 PM  Result Value Ref Range   Glucose-Capillary 54 (L) 70 - 99 mg/dL    Comment: Glucose reference range applies only to samples taken after fasting for at least 8 hours.  Glucose, capillary     Status: Abnormal   Collection Time: 02/20/20  4:47 PM  Result Value Ref Range   Glucose-Capillary 167 (H) 70 - 99 mg/dL    Comment: Glucose reference range applies only to samples taken after fasting for at least 8 hours.  Glucose, capillary     Status: Abnormal   Collection Time: 02/20/20  9:13 PM  Result Value Ref Range   Glucose-Capillary 132 (H) 70 - 99 mg/dL    Comment: Glucose reference range applies only to samples taken after fasting for at least 8 hours.  Glucose, capillary     Status: Abnormal   Collection Time: 02/20/20 11:31 PM  Result Value Ref Range   Glucose-Capillary 52 (L) 70 - 99 mg/dL    Comment: Glucose reference range applies only to samples taken after fasting for at least 8 hours.    Comment 1 Notify RN    Comment 2 Document in Chart   Glucose, capillary     Status: Abnormal   Collection Time: 02/21/20 12:13 AM  Result Value Ref Range   Glucose-Capillary 125 (H) 70 - 99 mg/dL    Comment: Glucose reference range applies only to samples taken after fasting for at least 8 hours.  Glucose, capillary     Status: Abnormal   Collection Time: 02/21/20  5:27 AM  Result Value Ref Range   Glucose-Capillary 61 (L) 70 - 99 mg/dL    Comment: Glucose reference range applies only to samples taken after fasting for at least 8 hours.   Comment 1 Notify RN    Comment 2 Document in Chart   Glucose, capillary     Status: Abnormal   Collection Time: 02/21/20  6:13 AM  Result Value Ref Range   Glucose-Capillary 179 (H) 70 - 99 mg/dL    Comment: Glucose reference range applies only to samples taken after fasting for at least 8 hours.  Comprehensive metabolic panel     Status: Abnormal   Collection Time: 02/21/20  7:49 AM  Result  Value Ref Range   Sodium 139 135 - 145 mmol/L   Potassium 2.8 (L) 3.5 - 5.1 mmol/L   Chloride 110 98 - 111 mmol/L   CO2 18 (L) 22 - 32 mmol/L   Glucose, Bld 126 (H) 70 - 99 mg/dL    Comment: Glucose reference range applies only to samples taken after fasting for at least 8 hours.   BUN 13 8 - 23 mg/dL   Creatinine, Ser 0.95 0.44 - 1.00 mg/dL   Calcium 7.2 (L) 8.9 - 10.3 mg/dL   Total Protein 5.6 (L) 6.5 - 8.1 g/dL   Albumin 2.5 (L) 3.5 - 5.0 g/dL   AST 10 (L) 15 - 41 U/L   ALT 7 0 - 44 U/L   Alkaline Phosphatase 59 38 - 126 U/L   Total Bilirubin 1.0 0.3 - 1.2 mg/dL   GFR calc non Af Amer 57 (L) >60 mL/min   GFR calc Af Amer >60 >60 mL/min   Anion gap 11 5 - 15    Comment: Performed at [facility], [place] [address] [place], [place] [address]  Magnesium     Status: Abnormal   Collection Time: 02/21/20  7:49 AM  Result Value Ref Range   Magnesium 1.3 (L) 1.7 - 2.4 mg/dL    Comment: Performed at [facility], [place]  [address], [place], [place] [address]  Phosphorus     Status: Abnormal   Collection Time: 02/21/20  7:49 AM  Result Value Ref Range   Phosphorus 2.4 (L) 2.5 - 4.6 mg/dL    Comment: Performed at [facility], [place] [address], [place], [place] [address]  Glucose, capillary     Status: Abnormal   Collection Time: 02/21/20  7:53 AM  Result Value Ref Range   Glucose-Capillary 121 (H) 70 - 99 mg/dL    Comment: Glucose reference range applies only to samples taken after fasting for at least 8 hours.   Comment 1 Notify RN    Comment 2 Document in Chart   Glucose, capillary     Status: Abnormal   Collection Time: 02/21/20 11:39 AM  Result Value Ref Range   Glucose-Capillary 63 (L) 70 - 99 mg/dL    Comment: Glucose reference range applies only to samples taken after fasting for at least 8 hours.   Comment 1 Notify RN    Comment 2 Document in Chart   Glucose, capillary     Status: None   Collection Time: 02/21/20  4:51 PM  Result Value Ref Range   Glucose-Capillary 73 70 - 99 mg/dL    Comment: Glucose reference range applies only to samples taken after fasting for at least 8 hours.    CT Head Wo Contrast  Result Date: 02/19/2020 CLINICAL DATA:  Right-sided tooth abscess, generalized weakness, fell EXAM: CT HEAD WITHOUT CONTRAST TECHNIQUE: Contiguous axial images were obtained from the base of the skull through the vertex without intravenous contrast. COMPARISON:  None. FINDINGS: Brain: No acute infarct or hemorrhage. Lateral ventricles and midline structures are unremarkable. No acute extra-axial fluid collections. No mass effect. Vascular: No hyperdense vessel or unexpected calcification. Skull: Normal. Negative for fracture or focal lesion. Sinuses/Orbits: No acute finding. Other: None IMPRESSION: 1. No acute intracranial process. Electronically Signed   By: Randa Ngo M.D.   On: 02/19/2020 19:16   CT MAXILLOFACIAL W CONTRAST  Result Date: 02/20/2020 CLINICAL DATA:   Initial evaluation for right-sided facial swelling, abscess tooth. EXAM: CT MAXILLOFACIAL WITH CONTRAST  TECHNIQUE: Multidetector CT imaging of the maxillofacial structures was performed with intravenous contrast. Multiplanar CT image reconstructions were also generated. CONTRAST:  25m OMNIPAQUE IOHEXOL 300 MG/ML  SOLN COMPARISON:  None available. FINDINGS: Osseous: Examination mildly degraded by motion artifact. No acute fracture seen about the facial bones. Remote posttraumatic defect noted at the right lamina papyracea with medial is a shin of a small portion of the intraorbital fat. No discrete osseous lesions. Orbits: Globes and orbital soft tissues within normal limits. No evidence for postseptal or intraorbital cellulitis. Sinuses: Mild-to-moderate mucoperiosteal thickening noted within the right maxillary sinus. Paranasal sinuses are otherwise clear. Mastoid air cells and middle ear cavities are well pneumatized and free of fluid. Soft tissues: Asymmetric soft tissue swelling with inflammatory stranding seen adjacent to the right mandibular body, concerning for acute infection/cellulitis. Innumerable dental caries with periapical lucency seen about the remaining dentition, likely reflecting the source of infection. There is a more focal area of swelling with soft tissue stranding seen involving the right submental region, likely reflecting phlegmon (series 6, image 39). Changes extend to the overlying skin. No definite discrete abscess or drainable fluid collection at this time. Mild edema seen adjacent to the right mandible without definite discrete odontogenic abscess. There is asymmetric sclerosis with cortical thickening and periosteal reaction seen about the right mandibular body, extending from the parasymphyseal region to approximately the angle of the mandible, suggesting associated osteomyelitis (series 3, image 68). Limited intracranial: Unremarkable. IMPRESSION: 1. Soft tissue swelling with  inflammatory stranding adjacent to the right mandible, concerning for acute infection/cellulitis. A more focal area of phlegmon involves the right submental region. No definite discrete abscess or drainable fluid collection. Underlying poor dentition suggests an odontogenic source. 2. Asymmetric sclerosis with periosteal reaction involving the right mandibular body, suggesting associated osteomyelitis. Electronically Signed   By: BJeannine BogaM.D.   On: 02/20/2020 01:50   MR FACE/TRIGEMINAL WO/W CM  Result Date: 02/20/2020 CLINICAL DATA:  Left jaw pain and swelling EXAM: MRI FACE TRIGEMINAL WITHOUT AND WITH CONTRAST TECHNIQUE: Multiplanar, multiecho pulse sequences of the face and surrounding structures, including thin slice imaging of the course of the Trigeminal Nerves, were obtained both before and after administration of intravenous contrast. CONTRAST:  534mGADAVIST GADOBUTROL 1 MMOL/ML IV SOLN COMPARISON:  Maxillofacial CT earlier same day FINDINGS: Motion artifact is present. As seen on prior CT, there is asymmetric soft tissue thickening in the right submental region with overlying skin thickening. There is associated enhancement. This also involves the fat surrounding the right inferior alveolar nerve. Soft tissue thickening and enhancement are also present along the lingual and buccal aspects of the right mandibular body and angle. There is replacement of normal T1 marrow signal of the adjacent mandible. There is mild enhancement of the marrow at the level of the mandibular foramen (series 9, image 24). Dental disease is better evaluated on the CT. Limited intracranial imaging demonstrates no abnormal enhancement. Orbits are unremarkable. Mild paranasal sinus mucosal thickening. IMPRESSION: Inflammatory changes along the buccal and lingual aspects of the body and angle of the mandible on the right. There is abnormal signal of the adjacent mandible corresponding to sclerosis on CT likely  reflecting sequelae of chronic inflammation. Mild marrow enhancement at the level of the mandibular foramen could reflect an area of more acute osteomyelitis. More focal soft tissue thickening and fluid in the right submental region likely reflecting phlegmon/developing multilocular abscess. Electronically Signed   By: PrMacy Mis.D.   On: 02/20/2020 09:06  [place] EKG Site Rite  Result Date: 02/21/2020 If [facility] image not attached, placement could not be confirmed due to current cardiac rhythm.   Review of Systems Other than hpi, neg Blood pressure 132/63, pulse (!) 59, temperature (!) 97.3 F (36.3 C), temperature source Oral, resp. rate 18, height '5\' 5"'  (1.651 m), weight 59.7 kg, SpO2 100 %. Physical Exam Gen: alert, nad HEENT: mild submental edema/erythema; firm and tender to the touch. There is also a draining fistula tract in the submental region with noted greenish-yellow purulence. Intraorally, poor dentition with multiple retained maxillary roots. Multiple carious/retained roots in the mandibular arch. There is a 1x1cm area of exposed bone in the right mandibular alveolus. Mild FOM edema. Uvula midline, oropharynx clear.  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 Recommend holding Denosumab inj for now. The patient will be taken to the OR tomorrow for incision and drainage/debridment of mandibular osteomyelitis, along with surgical removal of remaining mandibular dentition and multiple maxillary retained roots. R/B/A have been discussed with the patient to include no treatment, continued worsening of her MRONJ, need to additional surgery. Cont Vanc/Zosyn - pending cultures, will obtain additional cultures in the OR. Will likely require prolonged IV abx therapy.  **Postop Recommendations: 1. Continue IV antibiotics 2. Tylenol prn pain; Morphine prn  breakthrough pain 3. Chlorhexidine rinses tid until further notice. 4. Penrose drains will remain for 2-3 days - change dressing q8h 5. Pureed consistency diet.  *Please call Dr. [name] at [phone] with questions/concerns regarding her osteomyelitits.   Brittany Archer, DMD Oral & Maxillofacial [place] 02/21/2020, 5:52 PM

## 2020-02-21 NOTE — Progress Notes (Signed)
Peripherally Inserted Central Catheter/Midline Placement  The IV Nurse has discussed with the patient and/or persons authorized to consent for the patient, the purpose of this procedure and the potential benefits and risks involved with this procedure.  The benefits include less needle sticks, lab draws from the catheter, and the patient may be discharged home with the catheter. Risks include, but not limited to, infection, bleeding, blood clot (thrombus formation), and puncture of an artery; nerve damage and irregular heartbeat and possibility to perform a PICC exchange if needed/ordered by physician.  Alternatives to this procedure were also discussed.  Bard Power PICC patient education guide, fact sheet on infection prevention and patient information card has been provided to patient /or left at bedside.    Consent obtained from sister via Telephone  PICC/Midline Placement Documentation  PICC Single Lumen [id number] PICC Right Basilic 42 cm 3 cm (Active)  Indication for Insertion or Continuance of Line Prolonged intravenous therapies 02/21/20 1044  Exposed Catheter (cm) 3 cm 02/21/20 1044  Site Assessment Clean;Dry;Intact 02/21/20 1044  Line Status Flushed;Saline locked;Blood return noted 02/21/20 1044  Dressing Type Transparent;Securing device 02/21/20 1044  Dressing Status Clean;Dry;Intact;Antimicrobial disc in place 02/21/20 1044  Dressing Change Due 02/28/20 02/21/20 1044       Jeanice Lim M 02/21/2020, 10:46 AM

## 2020-02-22 ENCOUNTER — Encounter (HOSPITAL_COMMUNITY)
Admission: EM | Disposition: A | Discharge status: Home Health | Payer: Self-pay | Source: Home / Self Care | Attending: Internal Medicine

## 2020-02-22 ENCOUNTER — Inpatient Hospital Stay (HOSPITAL_COMMUNITY): Payer: Medicare Other | Admitting: Certified Registered Nurse Anesthetist

## 2020-02-22 DIAGNOSIS — K047 Periapical abscess without sinus: Secondary | ICD-10-CM

## 2020-02-22 DIAGNOSIS — K0889 Other specified disorders of teeth and supporting structures: Secondary | ICD-10-CM

## 2020-02-22 DIAGNOSIS — Z853 Personal history of malignant neoplasm of breast: Secondary | ICD-10-CM

## 2020-02-22 DIAGNOSIS — M272 Inflammatory conditions of jaws: Secondary | ICD-10-CM

## 2020-02-22 DIAGNOSIS — C9000 Multiple myeloma not having achieved remission: Secondary | ICD-10-CM

## 2020-02-22 DIAGNOSIS — B351 Tinea unguium: Secondary | ICD-10-CM

## 2020-02-22 DIAGNOSIS — E1169 Type 2 diabetes mellitus with other specified complication: Secondary | ICD-10-CM

## 2020-02-22 HISTORY — PX: TOOTH EXTRACTION: SHX859

## 2020-02-22 HISTORY — PX: DEBRIDEMENT MANDIBLE: SHX5308

## 2020-02-22 LAB — GLUCOSE, CAPILLARY
Glucose-Capillary: 72 mg/dL (ref 70–99)
Glucose-Capillary: 107 mg/dL — ABNORMAL HIGH (ref 70–99)
Glucose-Capillary: 82 mg/dL (ref 70–99)
Glucose-Capillary: 164 mg/dL — ABNORMAL HIGH (ref 70–99)
Glucose-Capillary: 189 mg/dL — ABNORMAL HIGH (ref 70–99)

## 2020-02-22 LAB — COMPREHENSIVE METABOLIC PANEL
ALT: 7 U/L (ref 0–44)
AST: 11 U/L — ABNORMAL LOW (ref 15–41)
Albumin: 2.6 g/dL — ABNORMAL LOW (ref 3.5–5.0)
Alkaline Phosphatase: 56 U/L (ref 38–126)
Anion gap: 10 (ref 5–15)
BUN: 5 mg/dL — ABNORMAL LOW (ref 8–23)
CO2: 21 mmol/L — ABNORMAL LOW (ref 22–32)
Calcium: 7.1 mg/dL — ABNORMAL LOW (ref 8.9–10.3)
Chloride: 108 mmol/L (ref 98–111)
Creatinine, Ser: 0.79 mg/dL (ref 0.44–1.00)
GFR calc Af Amer: >60 mL/min (ref >60)
GFR calc non Af Amer: >60 mL/min (ref >60)
Glucose, Bld: 92 mg/dL (ref 70–99)
Potassium: 3.3 mmol/L — ABNORMAL LOW (ref 3.5–5.1)
Sodium: 139 mmol/L (ref 135–145)
Total Bilirubin: 1.2 mg/dL (ref 0.3–1.2)
Total Protein: 5.9 g/dL — ABNORMAL LOW (ref 6.5–8.1)

## 2020-02-22 LAB — SURGICAL PCR SCREEN
MRSA, PCR: NEGATIVE
Staphylococcus aureus: NEGATIVE

## 2020-02-22 LAB — CBC
HCT: 30.2 % — ABNORMAL LOW (ref 36.0–46.0)
Hemoglobin: 9.4 g/dL — ABNORMAL LOW (ref 12.0–15.0)
MCH: 29.3 pg (ref 26.0–34.0)
MCHC: 31.1 g/dL (ref 30.0–36.0)
MCV: 94.1 fL (ref 80.0–100.0)
Platelets: 123 10*3/uL — ABNORMAL LOW (ref 150–400)
RBC: 3.21 MIL/uL — ABNORMAL LOW (ref 3.87–5.11)
RDW: 18.7 % — ABNORMAL HIGH (ref 11.5–15.5)
WBC: 3.3 10*3/uL — ABNORMAL LOW (ref 4.0–10.5)
nRBC: 0 % (ref 0.0–0.2)

## 2020-02-22 LAB — C DIFFICILE QUICK SCREEN W PCR REFLEX
C Diff antigen: NEGATIVE
C Diff interpretation: NOT DETECTED
C Diff toxin: NEGATIVE

## 2020-02-22 LAB — MAGNESIUM: Magnesium: 1.4 mg/dL — ABNORMAL LOW (ref 1.7–2.4)

## 2020-02-22 LAB — PHOSPHORUS: Phosphorus: 2.1 mg/dL — ABNORMAL LOW (ref 2.5–4.6)

## 2020-02-22 SURGERY — DEBRIDEMENT, MANDIBLE
Anesthesia: General

## 2020-02-22 MED ORDER — SUCCINYLCHOLINE CHLORIDE 200 MG/10ML IV SOSY
PREFILLED_SYRINGE | INTRAVENOUS | Status: DC | PRN
  Administered 2020-02-22: 160 mg via INTRAVENOUS

## 2020-02-22 MED ORDER — PHENYLEPHRINE 40 MCG/ML (10ML) SYRINGE FOR IV PUSH (FOR BLOOD PRESSURE SUPPORT)
PREFILLED_SYRINGE | INTRAVENOUS | Status: DC | PRN
  Administered 2020-02-22 (×5): 80 ug via INTRAVENOUS

## 2020-02-22 MED ORDER — BUPIVACAINE-EPINEPHRINE 0.5% -1:200000 IJ SOLN
INTRAMUSCULAR | Status: AC
  Filled 2020-02-22: qty 1

## 2020-02-22 MED ORDER — PHENYLEPHRINE 40 MCG/ML (10ML) SYRINGE FOR IV PUSH (FOR BLOOD PRESSURE SUPPORT)
PREFILLED_SYRINGE | INTRAVENOUS | Status: AC
  Filled 2020-02-22: qty 10

## 2020-02-22 MED ORDER — ROCURONIUM BROMIDE 10 MG/ML (PF) SYRINGE
PREFILLED_SYRINGE | INTRAVENOUS | Status: AC
  Filled 2020-02-22: qty 10

## 2020-02-22 MED ORDER — SODIUM CHLORIDE 0.9 % IV SOLN: INTRAVENOUS | Status: DC | PRN

## 2020-02-22 MED ORDER — HYDROMORPHONE HCL 1 MG/ML IJ SOLN: 0.2500 mg | INTRAMUSCULAR | Status: DC | PRN

## 2020-02-22 MED ORDER — MEPERIDINE HCL 50 MG/ML IJ SOLN: 6.2500 mg | INTRAMUSCULAR | Status: DC | PRN

## 2020-02-22 MED ORDER — FENTANYL CITRATE (PF) 100 MCG/2ML IJ SOLN
INTRAMUSCULAR | Status: AC
  Filled 2020-02-22: qty 2

## 2020-02-22 MED ORDER — PROPOFOL 10 MG/ML IV BOLUS
INTRAVENOUS | Status: DC | PRN
  Administered 2020-02-22: 130 mg via INTRAVENOUS

## 2020-02-22 MED ORDER — OXYMETAZOLINE HCL 0.05 % NA SOLN
NASAL | Status: AC
  Filled 2020-02-22: qty 30

## 2020-02-22 MED ORDER — BUPIVACAINE-EPINEPHRINE (PF) 0.5% -1:200000 IJ SOLN
INTRAMUSCULAR | Status: DC | PRN
  Administered 2020-02-22: 20 mL

## 2020-02-22 MED ORDER — LIDOCAINE 2% (20 MG/ML) 5 ML SYRINGE
INTRAMUSCULAR | Status: AC
  Filled 2020-02-22: qty 5

## 2020-02-22 MED ORDER — BACITRACIN ZINC 500 UNIT/GM EX OINT
TOPICAL_OINTMENT | CUTANEOUS | Status: DC | PRN
  Administered 2020-02-22: 1 via TOPICAL

## 2020-02-22 MED ORDER — DEXAMETHASONE SODIUM PHOSPHATE 10 MG/ML IJ SOLN
INTRAMUSCULAR | Status: AC
  Filled 2020-02-22: qty 1

## 2020-02-22 MED ORDER — LIDOCAINE-EPINEPHRINE 2 %-1:100000 IJ SOLN
INTRAMUSCULAR | Status: AC
  Filled 2020-02-22: qty 1.7

## 2020-02-22 MED ORDER — ONDANSETRON HCL 4 MG/2ML IJ SOLN
INTRAMUSCULAR | Status: AC
  Filled 2020-02-22: qty 2

## 2020-02-22 MED ORDER — LIDOCAINE-EPINEPHRINE 2 %-1:100000 IJ SOLN
INTRAMUSCULAR | Status: AC
  Filled 2020-02-22: qty 1

## 2020-02-22 MED ORDER — 0.9 % SODIUM CHLORIDE (POUR BTL) OPTIME
TOPICAL | Status: DC | PRN
  Administered 2020-02-22: 17:00:00 1000 mL

## 2020-02-22 MED ORDER — BACITRACIN ZINC 500 UNIT/GM EX OINT
TOPICAL_OINTMENT | CUTANEOUS | Status: AC
  Filled 2020-02-22: qty 28.35

## 2020-02-22 MED ORDER — PHENYLEPHRINE HCL-NACL 10-0.9 MG/250ML-% IV SOLN
INTRAVENOUS | Status: DC | PRN
  Administered 2020-02-22: 40 ug/min via INTRAVENOUS

## 2020-02-22 MED ORDER — LACTATED RINGERS IV SOLN: INTRAVENOUS | Status: DC

## 2020-02-22 MED ORDER — DEXAMETHASONE SODIUM PHOSPHATE 10 MG/ML IJ SOLN
INTRAMUSCULAR | Status: DC | PRN
  Administered 2020-02-22: 8 mg via INTRAVENOUS

## 2020-02-22 MED ORDER — LIDOCAINE-EPINEPHRINE 2 %-1:100000 IJ SOLN
INTRAMUSCULAR | Status: DC | PRN
  Administered 2020-02-22: 20 mL via INTRADERMAL

## 2020-02-22 MED ORDER — FENTANYL CITRATE (PF) 100 MCG/2ML IJ SOLN
INTRAMUSCULAR | Status: DC | PRN
  Administered 2020-02-22: 25 ug via INTRAVENOUS
  Administered 2020-02-22: 100 ug via INTRAVENOUS

## 2020-02-22 MED ORDER — MAGNESIUM SULFATE 2 GM/50ML IV SOLN
2.0000 g | Freq: Once | INTRAVENOUS | Status: AC
  Administered 2020-02-22: 2 g via INTRAVENOUS
  Filled 2020-02-22: qty 50

## 2020-02-22 MED ORDER — ONDANSETRON HCL 4 MG/2ML IJ SOLN: 4.0000 mg | Freq: Once | INTRAMUSCULAR | Status: DC | PRN

## 2020-02-22 MED ORDER — OXYMETAZOLINE HCL 0.05 % NA SOLN
NASAL | Status: DC | PRN
  Administered 2020-02-22: 2 via NASAL

## 2020-02-22 MED ORDER — PROPOFOL 10 MG/ML IV BOLUS
INTRAVENOUS | Status: AC
  Filled 2020-02-22: qty 20

## 2020-02-22 MED ORDER — ONDANSETRON HCL 4 MG/2ML IJ SOLN
INTRAMUSCULAR | Status: DC | PRN
  Administered 2020-02-22: 4 mg via INTRAVENOUS

## 2020-02-22 MED ORDER — POTASSIUM PHOSPHATES 15 MMOLE/5ML IV SOLN
30.0000 mmol | Freq: Once | INTRAVENOUS | Status: AC
  Administered 2020-02-22: 30 mmol via INTRAVENOUS
  Filled 2020-02-22: qty 10

## 2020-02-22 SURGICAL SUPPLY — 43 items
ATTRACTOMAT 16X20 MAGNETIC DRP (DRAPES) ×3 IMPLANT
BAG SPEC THK2 15X12 ZIP CLS (MISCELLANEOUS) ×1
BAG ZIPLOCK 12X15 (MISCELLANEOUS) ×3 IMPLANT
BLADE SURG 15 STRL LF DISP TIS (BLADE) ×2 IMPLANT
BLADE SURG 15 STRL SS (BLADE) ×6
BNDG CONFORM 2 STRL LF (GAUZE/BANDAGES/DRESSINGS) ×2 IMPLANT
BNDG EYE OVAL (GAUZE/BANDAGES/DRESSINGS) ×2 IMPLANT
BUR EGG ELITE 4.0 (BURR) ×1 IMPLANT
BUR EGG ELITE 4.0MM (BURR) ×1
BUR OVAL CARBIDE 4.0 (BURR) ×2 IMPLANT
CANNULA VESSEL W/WING WO/VALVE (CANNULA) ×3 IMPLANT
CLIP VESOCCLUDE SM WIDE 6/CT (CLIP) IMPLANT
COVER BACK TABLE 60X90IN (DRAPES) ×3 IMPLANT
COVER SURGICAL LIGHT HANDLE (MISCELLANEOUS) ×3 IMPLANT
COVER WAND RF STERILE (DRAPES) IMPLANT
DRAIN PENROSE 0.25X18 (DRAIN) ×2 IMPLANT
ELECT COATED BLADE 2.86 ST (ELECTRODE) ×1 IMPLANT
ELECT REM PT RETURN 15FT ADLT (MISCELLANEOUS) ×3 IMPLANT
GAUZE 4X4 16PLY RFD (DISPOSABLE) ×3 IMPLANT
GAUZE SPONGE 4X4 12PLY STRL (GAUZE/BANDAGES/DRESSINGS) ×2 IMPLANT
KIT BASIN OR (CUSTOM PROCEDURE TRAY) ×3 IMPLANT
KIT TURNOVER KIT A (KITS) IMPLANT
NDL DENTAL RB 25GX1.25 (NEEDLE) ×2 IMPLANT
NEEDLE DENTAL RB 25GX1.25 (NEEDLE) ×6 IMPLANT
NS IRRIG 1000ML POUR BTL (IV SOLUTION) ×3 IMPLANT
PACK EENT SPLIT (PACKS) ×3 IMPLANT
PACKING VAGINAL (PACKING) ×3 IMPLANT
PENCIL SMOKE EVACUATOR (MISCELLANEOUS) IMPLANT
SOL PREP POV-IOD 4OZ 10% (MISCELLANEOUS) ×1 IMPLANT
SOL PREP PROV IODINE SCRUB 4OZ (MISCELLANEOUS) ×1 IMPLANT
SUT CHROMIC 3 0 PS 2 (SUTURE) ×7 IMPLANT
SUT ETHILON 3 0 PS 1 (SUTURE) ×2 IMPLANT
SUT VIC AB 2-0 PS2 27 (SUTURE) ×1 IMPLANT
SUT VIC AB 3-0 PS2 18 (SUTURE) ×3
SUT VIC AB 3-0 PS2 18XBRD (SUTURE) ×2 IMPLANT
SUT VIC AB 4-0 PS2 27 (SUTURE) ×6 IMPLANT
SYR 50ML LL SCALE MARK (SYRINGE) ×5 IMPLANT
SYR BULB IRRIGATION 50ML (SYRINGE) ×3 IMPLANT
TOOTHBRUSH ADULT (PERSONAL CARE ITEMS) ×1 IMPLANT
TRAY FOLEY MTR SLVR 16FR STAT (SET/KITS/TRAYS/PACK) ×3 IMPLANT
WATER STERILE IRR 1000ML POUR (IV SOLUTION) ×3 IMPLANT
YANKAUER SUCT BULB TIP 10FT TU (MISCELLANEOUS) ×3 IMPLANT
YANKAUER SUCT BULB TIP NO VENT (SUCTIONS) ×3 IMPLANT

## 2020-02-22 NOTE — Progress Notes (Signed)
Nurse Tech offered CHG bath twice, pt refused and stated "no, I dont feel good". Nurse Tech stated she will continue to try, but as of now the pt has refused.  Pt stable at this time, resting in  Bed with not concerns or distress noted. Will continue to monitor.

## 2020-02-22 NOTE — Transfer of Care (Signed)
Immediate Anesthesia Transfer of Care Note  Patient: Brittany Archer  Procedure(s) Performed: INCISION AND DRAINAGE WITH DEBRIDEMENT MANDIBLE (N/A ) DENTAL RESTORATION/EXTRACTIONS (N/A )  Patient Location: PACU  Anesthesia Type:General  Level of Consciousness: awake and patient cooperative  Airway & Oxygen Therapy: Patient Spontanous Breathing and Patient connected to face mask  Post-op Assessment: Report given to RN and Post -op Vital signs reviewed and stable  Post vital signs: Reviewed and stable  Last Vitals:  Vitals Value Taken Time  BP 151/55 02/22/20 1905  Temp    Pulse 79 02/22/20 1906  Resp 17 02/22/20 1909  SpO2 100 % 02/22/20 1906  Vitals shown include unvalidated device data.  Last Pain:  Vitals:   02/22/20 1350  TempSrc: Oral  PainSc:          Complications: No apparent anesthesia complications

## 2020-02-22 NOTE — Op Note (Signed)
02/22/2020  7:03 PM  PATIENT:  Brittany Archer  79 y.o. female  PRE-OPERATIVE DIAGNOSIS:  MANDIBULAR OSTEOMYELITIS/MRONJ  POST-OPERATIVE DIAGNOSIS:  MANDIBULAR OSTEOMYELITIS/MRONJ  PROCEDURE:   1. Incision and drainage of submental abscess - extraoral approach 2. Closure of submental cutaneous fistula. 3. Surgical extraction of teeth #2,15,16,21,22,23,24,26 4. Alveoloplasty bilateral mandible  SURGEON:  Surgeon(s) and Role:    * Mizraim Harmening, DMD - Primary  ASSISTANTS: Jilda Panda  ANESTHESIA:   general  EBL:  25 mL   BLOOD ADMINISTERED:none   CULTURES: Submental wound and right intraoral wounds  DRAINS: Penrose drain in the submental region x2  Operative Findings: 1. Greenish-yellow purulence noted from submental wounds 2. Large area of exposed bone noted in the right mandibular body alveolus. 3. Reactive type bone formation was noted on the lateral aspect of the right mandibular body area c/w Garre's osteomyelitis.  Indication for procedure: 79 y/o with multiple comobidities with long-standing odontogenic infection, h/o Xgeva injection monthly for MM. 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  Procedure: The patient was identified in the preoperative holding by both anesthesia and the maxillofacial team.  Health history was reviewed.  Consent was verified.  The patient was brought back to the operating room and placed in the table in the supine position.  Standard ASA leads and monitors were placed.  Patient was preoxygenated, induced, and her airway was protected with a nasal endotracheal tube.  The tube was taped and secured by the anesthesia care team.  The patient was then prepped and draped for standard maxillofacial procedure.  The patient was injected with 12 cc of 1% lidocaine with 1:100,000 epinephrine in the bilateral maxilla and mandible.  A throat pack was placed.  Cultures were  obtained from the submental wound that was draining from persistent cutaneous fistula.  Additional cultures for anaerobic/aerobic bacteria were obtained from the right mandibular body or active greenish/yellow purulence was noted.  Attention was first directed in the right maxilla where a 15 blade was used to make a sulcular incision around tooth #2.  Buccal bone was removed with a rongeur forcep.  The remaining root was extracted with a dental elevator.  The site was thoroughly curetted and irrigated.  The tissues were then reapproximated with 3-0 Chromic Gut suture.  Attention was then directed to the left posterior maxilla where a 15 blade was used to make a sulcular incision around the retained roots in this area.  Tissue was removed for further exposure.  The roots were then removed with a dental elevator.  The sites were curetted and irrigated.  And the wound was reapproximated with 3-0 Chromic Gut sutures.  Attention was then directed to the mandible where a crestal and sulcular incisions were made bilaterally in the mandibular anterior and posterior areas.  There is an area of large bony exposure in the right mandibular body area consistent with MRONJ. A full-thickness flap was elevated to the facial and the lingual.  Buccal bone was removed with a rongeur forcep around the remaining teeth.  These teeth were then luxated and extracted with a dental forcep.  The teeth were removed included teeth #21, 22, 23, 24, 26.  The sites as well as areas in the right mandibular body were thoroughly curetted and debrided.  Alveoplasty was completed with a large round bur under irrigation as well as a rondure forcep.  The bone was then smoothed with a bone file.  Further dissection  was carried out along the right mandibular body area where a reactive bone was encountered on the lateral aspect. This was debrided in multiple areas.  The intraoral areas were then thoroughly irrigated with copious amounts of  saline.  Attention was then directed extraorally, where a 15 blade was used to make a 1 cm incision in the right posterior submental area through the skin and subcutaneous tissues.  A hemostat was then used to bluntly dissect to the inferior border of the mandible as well as course medially to the abscessed area.  Frank purulence was noted during dissection.  Dissection was also carried back posteriorly to the mandibular body area to aid in further drainage while healing.  The submental cutaneous fistula near the midline was excised with a 15 blade.  The margins of the wound were released with a tenotomy scissor in advance for primary closure first deep with 3-0 Vicryl sutures.  And then the skin was reapproximated with 3-0 nylon suture.  Quarter inch Penrose drains were then advanced into the incision site for a total of 2.  One coursing anteriorly into the submental area and another coursing posteriorly submandibular area.  These were secured with 3-0 silk sutures.  The wounds intraorally and extraorally were copiously irrigated with saline.  The intraoral wound was then closed with multiple 3-0 Vicryl sutures.  At this point the entire oral cavity was then thoroughly irrigated again.  The patient was injected with 10 cc of 0.5% Marcaine with 1-200,000 epinephrine in the bilateral mandible.  The throat pack was removed.  The patient was then returned to the anesthesia care team where she was extubated without event.  She was transported to the postanesthesia care unit for recovery.  And she will return to her inpatient room for further IV antibiotics and pain control.  Maxie Better, DMD Oral & Maxillofacial Surgery

## 2020-02-22 NOTE — Consult Note (Addendum)
[facility] for Infectious Disease    Date of Admission:  02/19/2020   Total days of antibiotics: 3 vanco/zosyn               Reason for Consult: Osteomyelitis of Jaw    Referring Provider: Pokhrel   Assessment: Osteomyelitis of Jaw DM2 (A1C 7.6%) Multiple Myeloma Breast Cancer 2008  Plan: 1. Continue vanco/zosyn 2. Await her debridement today, hopefully more Cx sent from OR.  3. She has PIC  Comment- S lugdenensis can be very hard to treat. Continue broad anbx for now.  Agree she will need long term anbx (6 weeks).   Thank you so much for this interesting consult,  Active Problems:   Hypokalemia   Type 2 diabetes mellitus ([place])   Coronary artery disease   Hypertension   Stage 3b chronic kidney disease   Hypomagnesemia   Hypophosphatemia   Facial cellulitis   Confusion   . acyclovir  400 mg Oral BID  . aspirin  81 mg Oral Daily  . Chlorhexidine Gluconate Cloth  6 each Topical Daily  . insulin aspart  0-15 Units Subcutaneous TID WC  . magnesium oxide  400 mg Oral BID  . sodium chloride flush  10-40 mL Intracatheter Q12H    HPI: Brittany Archer is a 79 y.o. female with hx of DM2, breast cancer (2008), multiple myeloma (tx started 12-2019- [facility], [facility]), comes to ED on 2-22 with worsening jaw pain and an abscessed tooth. She had no fever on adm. WBC 5.1. She was started on ceftriaxone in ED then changed to vanco/zosyn.  She had CT which showed: 1. Soft tissue swelling with inflammatory stranding adjacent to the right mandible, concerning for acute infection/cellulitis. A more focal area of phlegmon involves the right submental region. No definite discrete abscess or drainable fluid collection. Underlying poor dentition suggests an odontogenic source. 2. Asymmetric sclerosis with periosteal reaction involving the right mandibular body, suggesting associated osteomyelitis.  She had Cx in ED which showed S lugdenensis and a possible anaerobe.    She denies neck swelling, facial swelling, dysphagia.   Review of Systems: Review of Systems  Constitutional: Negative for chills and fever.  Eyes: Negative for blurred vision.  Gastrointestinal: Negative for constipation and diarrhea.  Genitourinary: Negative for dysuria.  Please see HPI. All other systems reviewed and negative.   Past Medical History:  Diagnosis Date  . Breast cancer ([place])    left breast/ 2008/ surg/ rad tx  . Coronary artery disease   . Diabetes mellitus     Social History   Tobacco Use  . Smoking status: Never Smoker  . Smokeless tobacco: Never Used  Substance Use Topics  . Alcohol use: No  . Drug use: No    Family History  Problem Relation Age of Onset  . Obesity Sister      Medications:  Scheduled: . acyclovir  400 mg Oral BID  . aspirin  81 mg Oral Daily  . Chlorhexidine Gluconate Cloth  6 each Topical Daily  . insulin aspart  0-15 Units Subcutaneous TID WC  . magnesium oxide  400 mg Oral BID  . sodium chloride flush  10-40 mL Intracatheter Q12H    Abtx:  Anti-infectives (From admission, onward)   Start     Dose/Rate Route Frequency Ordered Stop   02/21/20 0000  vancomycin (VANCOREADY) IVPB 750 mg/150 mL     750 mg 150 mL/hr over 60 Minutes Intravenous Every 24 hours  02/20/20 0742     02/20/20 1000  acyclovir (ZOVIRAX) tablet 400 mg     400 mg Oral 2 times daily 02/20/20 0630     02/20/20 0800  piperacillin-tazobactam (ZOSYN) IVPB 3.375 g     3.375 g 12.5 mL/hr over 240 Minutes Intravenous Every 8 hours 02/20/20 0422     02/19/20 2330  piperacillin-tazobactam (ZOSYN) IVPB 3.375 g     3.375 g 100 mL/hr over 30 Minutes Intravenous  Once 02/19/20 2327 02/20/20 0043   02/19/20 2330  vancomycin (VANCOCIN) IVPB 1000 mg/200 mL premix     1,000 mg 200 mL/hr over 60 Minutes Intravenous  Once 02/19/20 2327 02/20/20 0142   02/19/20 2215  cefTRIAXone (ROCEPHIN) 1 g in sodium chloride 0.9 % 100 mL IVPB     1 g 200 mL/hr over 30 Minutes  Intravenous  Once 02/19/20 2205 02/19/20 2311        OBJECTIVE: Blood pressure 116/68, pulse 61, temperature 98.4 F (36.9 C), temperature source Oral, resp. rate 12, height 5' 5" (1.651 m), weight 59.7 kg, SpO2 99 %.  Physical Exam Constitutional:      General: She is not in acute distress.    Appearance: Normal appearance.  HENT:     Mouth/Throat:     Mouth: Mucous membranes are moist.     Comments: Whitish area, tender on R mandible. No expressable d/c.  Eyes:     Extraocular Movements: Extraocular movements intact.     Pupils: Pupils are equal, round, and reactive to light.  Cardiovascular:     Rate and Rhythm: Normal rate and regular rhythm.  Pulmonary:     Effort: Pulmonary effort is normal.     Breath sounds: Normal breath sounds.  Abdominal:     General: Bowel sounds are normal. There is no distension.     Palpations: Abdomen is soft.     Tenderness: There is no abdominal tenderness.  Musculoskeletal:     Cervical back: No rigidity.     Right lower leg: No edema.     Left lower leg: No edema.  Feet:     Right foot:     Skin integrity: No ulcer, blister, skin breakdown or callus.     Toenail Condition: Fungal disease present.    Left foot:     Skin integrity: No ulcer, blister, skin breakdown or callus.     Toenail Condition: Fungal disease present. Lymphadenopathy:     Cervical: No cervical adenopathy.  Neurological:     Mental Status: She is alert.     Sensory: No sensory deficit.     Lab Results Results for orders placed or performed during the hospital encounter of 02/19/20 (from the past 48 hour(s))  Glucose, capillary     Status: Abnormal   Collection Time: 02/20/20  4:19 PM  Result Value Ref Range   Glucose-Capillary 54 (L) 70 - 99 mg/dL    Comment: Glucose reference range applies only to samples taken after fasting for at least 8 hours.  Glucose, capillary     Status: Abnormal   Collection Time: 02/20/20  4:47 PM  Result Value Ref Range    Glucose-Capillary 167 (H) 70 - 99 mg/dL    Comment: Glucose reference range applies only to samples taken after fasting for at least 8 hours.  Glucose, capillary     Status: Abnormal   Collection Time: 02/20/20  9:13 PM  Result Value Ref Range   Glucose-Capillary 132 (H) 70 - 99 mg/dL  Comment: Glucose reference range applies only to samples taken after fasting for at least 8 hours.  Glucose, capillary     Status: Abnormal   Collection Time: 02/20/20 11:31 PM  Result Value Ref Range   Glucose-Capillary 52 (L) 70 - 99 mg/dL    Comment: Glucose reference range applies only to samples taken after fasting for at least 8 hours.   Comment 1 Notify RN    Comment 2 Document in Chart   Glucose, capillary     Status: Abnormal   Collection Time: 02/21/20 12:13 AM  Result Value Ref Range   Glucose-Capillary 125 (H) 70 - 99 mg/dL    Comment: Glucose reference range applies only to samples taken after fasting for at least 8 hours.  Glucose, capillary     Status: Abnormal   Collection Time: 02/21/20  5:27 AM  Result Value Ref Range   Glucose-Capillary 61 (L) 70 - 99 mg/dL    Comment: Glucose reference range applies only to samples taken after fasting for at least 8 hours.   Comment 1 Notify RN    Comment 2 Document in Chart   Glucose, capillary     Status: Abnormal   Collection Time: 02/21/20  6:13 AM  Result Value Ref Range   Glucose-Capillary 179 (H) 70 - 99 mg/dL    Comment: Glucose reference range applies only to samples taken after fasting for at least 8 hours.  Comprehensive metabolic panel     Status: Abnormal   Collection Time: 02/21/20  7:49 AM  Result Value Ref Range   Sodium 139 135 - 145 mmol/L   Potassium 2.8 (L) 3.5 - 5.1 mmol/L   Chloride 110 98 - 111 mmol/L   CO2 18 (L) 22 - 32 mmol/L   Glucose, Bld 126 (H) 70 - 99 mg/dL    Comment: Glucose reference range applies only to samples taken after fasting for at least 8 hours.   BUN 13 8 - 23 mg/dL   Creatinine, Ser 0.95 0.44 -  1.00 mg/dL   Calcium 7.2 (L) 8.9 - 10.3 mg/dL   Total Protein 5.6 (L) 6.5 - 8.1 g/dL   Albumin 2.5 (L) 3.5 - 5.0 g/dL   AST 10 (L) 15 - 41 U/L   ALT 7 0 - 44 U/L   Alkaline Phosphatase 59 38 - 126 U/L   Total Bilirubin 1.0 0.3 - 1.2 mg/dL   GFR calc non Af Amer 57 (L) >60 mL/min   GFR calc Af Amer >60 >60 mL/min   Anion gap 11 5 - 15    Comment: Performed at [facility], [place] [address], [place], [place] [address]  Magnesium     Status: Abnormal   Collection Time: 02/21/20  7:49 AM  Result Value Ref Range   Magnesium 1.3 (L) 1.7 - 2.4 mg/dL    Comment: Performed at [facility], [place] [address] [place], [place] [address]  Phosphorus     Status: Abnormal   Collection Time: 02/21/20  7:49 AM  Result Value Ref Range   Phosphorus 2.4 (L) 2.5 - 4.6 mg/dL    Comment: Performed at [facility], [place] [address] [place], [place] [address]  Glucose, capillary     Status: Abnormal   Collection Time: 02/21/20  7:53 AM  Result Value Ref Range   Glucose-Capillary 121 (H) 70 - 99 mg/dL    Comment: Glucose reference range applies only to samples taken after fasting for at least 8 hours.  Comment 1 Notify RN    Comment 2 Document in Chart   Glucose, capillary     Status: Abnormal   Collection Time: 02/21/20 11:39 AM  Result Value Ref Range   Glucose-Capillary 63 (L) 70 - 99 mg/dL    Comment: Glucose reference range applies only to samples taken after fasting for at least 8 hours.   Comment 1 Notify RN    Comment 2 Document in Chart   Glucose, capillary     Status: None   Collection Time: 02/21/20  4:51 PM  Result Value Ref Range   Glucose-Capillary 73 70 - 99 mg/dL    Comment: Glucose reference range applies only to samples taken after fasting for at least 8 hours.  Glucose, capillary     Status: None   Collection Time: 02/21/20  9:54 PM  Result Value Ref Range   Glucose-Capillary 95 70 - 99 mg/dL    Comment: Glucose reference  range applies only to samples taken after fasting for at least 8 hours.  C difficile quick scan w PCR reflex     Status: None   Collection Time: 02/22/20 12:30 AM   Specimen: STOOL  Result Value Ref Range   C Diff antigen NEGATIVE NEGATIVE   C Diff toxin NEGATIVE NEGATIVE   C Diff interpretation No C. difficile detected.     Comment: Performed at [facility], [place] [address] [place], [place] [address]  CBC     Status: Abnormal   Collection Time: 02/22/20  3:22 AM  Result Value Ref Range   WBC 3.3 (L) 4.0 - 10.5 K/uL   RBC 3.21 (L) 3.87 - 5.11 MIL/uL   Hemoglobin 9.4 (L) 12.0 - 15.0 g/dL   HCT 30.2 (L) 36.0 - 46.0 %   MCV 94.1 80.0 - 100.0 fL   MCH 29.3 26.0 - 34.0 pg   MCHC 31.1 30.0 - 36.0 g/dL   RDW 18.7 (H) 11.5 - 15.5 %   Platelets 123 (L) 150 - 400 K/uL   nRBC 0.0 0.0 - 0.2 %    Comment: Performed at [facility], [place] [address], [place], [place] [address]  Comprehensive metabolic panel     Status: Abnormal   Collection Time: 02/22/20  3:22 AM  Result Value Ref Range   Sodium 139 135 - 145 mmol/L   Potassium 3.3 (L) 3.5 - 5.1 mmol/L   Chloride 108 98 - 111 mmol/L   CO2 21 (L) 22 - 32 mmol/L   Glucose, Bld 92 70 - 99 mg/dL    Comment: Glucose reference range applies only to samples taken after fasting for at least 8 hours.   BUN 5 (L) 8 - 23 mg/dL   Creatinine, Ser 0.79 0.44 - 1.00 mg/dL   Calcium 7.1 (L) 8.9 - 10.3 mg/dL   Total Protein 5.9 (L) 6.5 - 8.1 g/dL   Albumin 2.6 (L) 3.5 - 5.0 g/dL   AST 11 (L) 15 - 41 U/L   ALT 7 0 - 44 U/L   Alkaline Phosphatase 56 38 - 126 U/L   Total Bilirubin 1.2 0.3 - 1.2 mg/dL   GFR calc non Af Amer >60 >60 mL/min   GFR calc Af Amer >60 >60 mL/min   Anion gap 10 5 - 15    Comment: Performed at [facility], [place] [address] [place], [place] [address]  Magnesium     Status: Abnormal   Collection Time: 02/22/20  3:22 AM  Result Value  Ref Range   Magnesium 1.4 (L) 1.7 - 2.4  mg/dL    Comment: Performed at [facility], [place] [address] [place], [place] [address]  Phosphorus     Status: Abnormal   Collection Time: 02/22/20  3:22 AM  Result Value Ref Range   Phosphorus 2.1 (L) 2.5 - 4.6 mg/dL    Comment: Performed at [facility], [place] [address], [place], [place] [address]  Surgical pcr screen     Status: None   Collection Time: 02/22/20  6:12 AM   Specimen: Nasal Mucosa; Nasal Swab  Result Value Ref Range   MRSA, PCR NEGATIVE NEGATIVE   Staphylococcus aureus NEGATIVE NEGATIVE    Comment: (NOTE) The Xpert SA Assay (FDA approved for NASAL specimens in patients 69 years of age and older), is one component of a comprehensive surveillance program. It is not intended to diagnose infection nor to guide or monitor treatment. Performed at [facility], [place] [address], [place], [place] [address]   Glucose, capillary     Status: None   Collection Time: 02/22/20  7:59 AM  Result Value Ref Range   Glucose-Capillary 72 70 - 99 mg/dL    Comment: Glucose reference range applies only to samples taken after fasting for at least 8 hours.   Comment 1 Notify RN    Comment 2 Document in Chart   Glucose, capillary     Status: Abnormal   Collection Time: 02/22/20 11:47 AM  Result Value Ref Range   Glucose-Capillary 107 (H) 70 - 99 mg/dL    Comment: Glucose reference range applies only to samples taken after fasting for at least 8 hours.   Comment 1 Notify RN    Comment 2 Document in Chart       Component Value Date/Time   SDES  02/19/2020 2324    [facility] Performed at [facility], [address], [place], [place] [address]    [facility]  02/19/2020 2324    Immunocompromised Performed at [facility] [address] [place], [place] [address]    CULT  02/19/2020 2324    RARE STAPHYLOCOCCUS LUGDUNENSIS SUSCEPTIBILITIES TO FOLLOW HOLDING FOR POSSIBLE ANAEROBE Performed at [facility], [place] [address]  [place], [place] [address]    REPTSTATUS PENDING 02/19/2020 2324   [place] EKG Site Rite  Result Date: 02/21/2020 If [facility] image not attached, placement could not be confirmed due to current cardiac rhythm.  Recent Results (from the past 240 hour(s))  Urine Culture     Status: Abnormal   Collection Time: 02/19/20  9:40 PM   Specimen: Urine, Clean Catch  Result Value Ref Range Status   Specimen Description   Final    URINE, CLEAN CATCH Performed at [facility], [address], [place], [place] [address]    Special Requests   Final    NONE Performed at [facility], [address], [place], [place] [address]    Culture MULTIPLE SPECIES PRESENT, SUGGEST RECOLLECTION (A)  Final   Report Status 02/21/2020 FINAL  Final  SARS CORONAVIRUS 2 (TAT 6-24 HRS) Nasopharyngeal Nasopharyngeal Swab     Status: None   Collection Time: 02/19/20 10:21 PM   Specimen: Nasopharyngeal Swab  Result Value Ref Range Status   SARS Coronavirus 2 NEGATIVE NEGATIVE Final    Comment: (NOTE) SARS-CoV-2 target nucleic acids are NOT DETECTED. The SARS-CoV-2 RNA is generally detectable in upper and lower respiratory specimens during the acute phase of infection. Negative results do  not preclude SARS-CoV-2 infection, do not rule out co-infections with other pathogens, and should not be used as the sole basis for treatment or other patient management decisions. Negative results must be combined with clinical observations, patient history, and epidemiological information. The expected result is Negative. Fact Sheet for Patients: SugarRoll.be Fact Sheet for Healthcare Providers: https://www.woods-mathews.com/ This test is not yet approved or cleared by the [place] FDA and  has been authorized for detection and/or diagnosis of SARS-CoV-2 by FDA under an Emergency Use Authorization (EUA). This EUA will remain  in effect (meaning this test can be used) for the duration of  the COVID-19 declaration under Section 56 4(b)(1) of the Act, 21 U.S.C. section 360bbb-3(b)(1), unless the authorization is terminated or revoked sooner. Performed at [facility], [place] [address] [place], [place] [address]   Aerobic/Anaerobic Culture (surgical/deep wound)     Status: None (Preliminary result)   Collection Time: 02/19/20 11:24 PM   Specimen: Face  Result Value Ref Range Status   Specimen Description   Final    FACE Performed at [facility], [address], [place], [place] [address]    Special Requests   Final    Immunocompromised Performed at [facility], [address] [place], [place] [address]    Gram Stain   Final    FEW WBC PRESENT, PREDOMINANTLY PMN MODERATE GRAM POSITIVE COCCI IN CHAINS IN CLUSTERS RARE GRAM POSITIVE RODS    Culture   Final    RARE STAPHYLOCOCCUS LUGDUNENSIS SUSCEPTIBILITIES TO FOLLOW HOLDING FOR POSSIBLE ANAEROBE Performed at [facility], [place] [address] [place], [place] [address]    Report Status PENDING  Incomplete  C difficile quick scan w PCR reflex     Status: None   Collection Time: 02/22/20 12:30 AM   Specimen: STOOL  Result Value Ref Range Status   C Diff antigen NEGATIVE NEGATIVE Final   C Diff toxin NEGATIVE NEGATIVE Final   C Diff interpretation No C. difficile detected.  Final    Comment: Performed at [facility] [place] [address], [place], [place] [address]  Surgical pcr screen     Status: None   Collection Time: 02/22/20  6:12 AM   Specimen: Nasal Mucosa; Nasal Swab  Result Value Ref Range Status   MRSA, PCR NEGATIVE NEGATIVE Final   Staphylococcus aureus NEGATIVE NEGATIVE Final    Comment: (NOTE) The Xpert SA Assay (FDA approved for NASAL specimens in patients 66 years of age and older), is one component of a comprehensive surveillance program. It is not intended to diagnose infection nor to guide or monitor treatment. Performed at [facility], [place]  [address] [place], [place] [address]     Microbiology: Recent Results (from the past 240 hour(s))  Urine Culture     Status: Abnormal   Collection Time: 02/19/20  9:40 PM   Specimen: Urine, Clean Catch  Result Value Ref Range Status   Specimen Description   Final    URINE, CLEAN CATCH Performed at [facility], [address], [place], [place] [address]    Special Requests   Final    NONE Performed at [facility], [address] [place], [place] [address]    Culture MULTIPLE SPECIES PRESENT, SUGGEST RECOLLECTION (A)  Final   Report Status 02/21/2020 FINAL  Final  SARS CORONAVIRUS 2 (TAT 6-24 HRS) Nasopharyngeal Nasopharyngeal Swab     Status: None   Collection Time: 02/19/20 10:21 PM   Specimen: Nasopharyngeal Swab  Result Value Ref Range Status   SARS Coronavirus 2 NEGATIVE NEGATIVE Final    Comment: (NOTE) SARS-CoV-2 target nucleic acids are NOT DETECTED. The SARS-CoV-2 RNA is generally detectable in upper and lower respiratory specimens during the acute phase of infection. Negative results do not preclude SARS-CoV-2 infection, do not rule out co-infections with other pathogens, and should not be used as the sole basis for treatment or other patient management decisions. Negative results must be combined with clinical observations, patient history, and epidemiological information. The expected result is Negative. Fact Sheet for Patients: SugarRoll.be Fact Sheet for Healthcare Providers: https://www.woods-mathews.com/ This test is not yet approved or cleared by the [place] FDA and  has been authorized for detection and/or diagnosis of SARS-CoV-2 by FDA under an Emergency Use Authorization (EUA). This EUA will remain  in effect (meaning this test can be used) for the duration of the COVID-19 declaration under Section 56 4(b)(1) of the Act, 21 U.S.C. section 360bbb-3(b)(1), unless the authorization is terminated or revoked  sooner. Performed at [facility], [place] [address] [place], [place] [address]   Aerobic/Anaerobic Culture (surgical/deep wound)     Status: None (Preliminary result)   Collection Time: 02/19/20 11:24 PM   Specimen: Face  Result Value Ref Range Status   Specimen Description   Final    FACE Performed at [facility], [address] [place], [place] [address]    Special Requests   Final    Immunocompromised Performed at [facility], [address], [place], [place] [address]    Gram Stain   Final    FEW WBC PRESENT, PREDOMINANTLY PMN MODERATE GRAM POSITIVE COCCI IN CHAINS IN CLUSTERS RARE GRAM POSITIVE RODS    Culture   Final    RARE STAPHYLOCOCCUS LUGDUNENSIS SUSCEPTIBILITIES TO FOLLOW HOLDING FOR POSSIBLE ANAEROBE Performed at [facility], [place] [address], [place], [place] [address]    Report Status PENDING  Incomplete  C difficile quick scan w PCR reflex     Status: None   Collection Time: 02/22/20 12:30 AM   Specimen: STOOL  Result Value Ref Range Status   C Diff antigen NEGATIVE NEGATIVE Final   C Diff toxin NEGATIVE NEGATIVE Final   C Diff interpretation No C. difficile detected.  Final    Comment: Performed at [facility], [place] [address] [place], [place] [address]  Surgical pcr screen     Status: None   Collection Time: 02/22/20  6:12 AM   Specimen: Nasal Mucosa; Nasal Swab  Result Value Ref Range Status   MRSA, PCR NEGATIVE NEGATIVE Final   Staphylococcus aureus NEGATIVE NEGATIVE Final    Comment: (NOTE) The Xpert SA Assay (FDA approved for NASAL specimens in patients 14 years of age and older), is one component of a comprehensive surveillance program. It is not intended to diagnose infection nor to guide or monitor treatment. Performed at [facility], [place] [address] [place], [place] [address]     Radiographs and labs were personally reviewed by me.   Bobby Rumpf, MD [facility] [phone] 02/22/2020, 1:49 PM

## 2020-02-22 NOTE — Anesthesia Procedure Notes (Signed)
Procedure Name: Intubation Date/Time: 02/22/2020 5:11 PM Performed by: Anne Fu, CRNA Patient Re-evaluated:Patient Re-evaluated prior to induction Oxygen Delivery Method: Circle system utilized and Simple face mask Preoxygenation: Pre-oxygenation with 100% oxygen Induction Type: IV induction Ventilation: Mask ventilation without difficulty Laryngoscope Size: Mac and 4 Grade View: Grade I Nasal Tubes: Right, Nasal prep performed, Nasal Rae and Magill forceps - small, utilized Tube size: 6.5 mm Number of attempts: 1 Placement Confirmation: ETT inserted through vocal cords under direct vision,  positive ETCO2,  CO2 detector and breath sounds checked- equal and bilateral Tube secured with: Tape Dental Injury: Teeth and Oropharynx as per pre-operative assessment

## 2020-02-22 NOTE — Care Management Important Message (Signed)
Important Message  Patient Details IM Letter given to Roque Lias SW Case Manager to present to the Patient Name: Brittany Archer MRN: [id number] Date of Birth: 31-Jul-1941   Medicare Important Message Given:  Yes     Kerin Salen 02/22/2020, 2:33 PM

## 2020-02-22 NOTE — Brief Op Note (Addendum)
02/22/2020  7:03 PM  PATIENT:  Brittany Archer  79 y.o. female  PRE-OPERATIVE DIAGNOSIS:  MANDIBULAR OSTEOMYELITIS/MRONJ  POST-OPERATIVE DIAGNOSIS:  MANDIBULAR OSTEOMYELITIS/MRONJ  PROCEDURE:   1. Incision and drainage of submental abscess - extraoral approach 2. Closure of submental cutaneous fistula. 3. Surgical extraction of teeth #2,15,16,21,22,23,24,26 4. Alveoloplasty bilateral mandible  SURGEON:  Surgeon(s) and Role:    * Belky Mundo, DMD - Primary  ASSISTANTS: Jilda Panda  ANESTHESIA:   general  EBL:  25 mL   BLOOD ADMINISTERED:none   CULTURES: Submental wound and right intraoral wounds  DRAINS: Penrose drain in the submental region x2  LOCAL MEDICATIONS USED:  BUPIVICAINE   SPECIMEN:  No Specimen  DISPOSITION OF SPECIMEN:  N/A  COUNTS:  YES  TOURNIQUET:  * No tourniquets in log *  DICTATION: .Dragon Dictation  PLAN OF CARE: Admit to inpatient   PATIENT DISPOSITION:  PACU - hemodynamically stable.   Delay start of Pharmacological VTE agent (>24hrs) due to surgical blood loss or risk of bleeding: no  **Postop Recommendations: 1. Continue IV antibiotics 2. Tylenol prn pain; Morphine prn breakthrough pain 3. Chlorhexidine rinses tid until further notice. 4. Penrose drains will remain for 2-3 days - change dressing q8h 5. Pureed consistency diet

## 2020-02-22 NOTE — Progress Notes (Addendum)
PROGRESS NOTE  Brittany Archer [facility] OZH:[id number] DOB: 30-Oct-1941 DOA: 02/19/2020 PCP: [name], PA-C   LOS: 3 days   Brief narrative: As per HPI,  79 years old female with history of left breast cancer, CAD, type 2 diabetes mellitus,, HTN presented to the ER with worsening pain in addition to her abscessed tooth going on for 2 weeks with pain generalized weakness and also had a fall prior to admission. In the ED vitals were stable, UA WBC 11-20, CBC with WBC count 5.1,hemoglobin 12.1 g/dL and platelets 131. CMP showed a potassium of 2.5 and CO2 of 18 mmol/L. Her anion gap was 14. Glucose 140, BUN 38 and creatinine 1.45 mg/dL. Total protein is 6.7 albumin 3.2 g/dL. CT head did not show any acute intracranial abnormality. Maxillofacial CT was ordered and showed findings suspicious of osteomyelitis.  Patient was started on vancomycin and Zosyn and was admitted to the hospital. She is on chemo for multiple myeloma.   Assessment/Plan:  Active Problems:   Hypokalemia   Type 2 diabetes mellitus (HCC)   Coronary artery disease   Hypertension   Stage 3b chronic kidney disease   Hypomagnesemia   Hypophosphatemia   Facial cellulitis   Confusion   Acute osteomyelitis of the mandible secondary to dental abscess with facial cellulitis.  CT/MRI scan shows phlegmon and osteomyelitis.  Wound culture sent showed gram-positive organisms.  On vancomycin and Zosyn at this time.  Oral surgery Dr. [name] on board and patient is for incision and drainage and possible debridement of mandibular osteomyelitis today.  Patient will likely need prolonged antibiotic.  Will get ID consultation. Spoke with Dr Johnnye Sima.  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  Holding denosumab.  Multiple Myeloma on Xgeva q 28 days and Velcade weekly. Xgeva on hold  Hypophosphatemia/Hypomagnesemia/hypokalemia: Continue to replenish IV.  Check levels in a.m.  Type 2 diabetes mellitus  Hold Metformin and glipizide.  Continue sliding scale insulin, Accu-Cheks, hemoglobin A1c of 7.6.  On D10 water.  Last POC glucose was 95.  Coronary artery disease: On aspirin.  No acute chest pain.  Hypertension.  on lisinopril/HCTZ at home. IV antihypertensives as needed   Stage 3b chronic kidney disease.  Continue gentle IV fluid hydration,.  Currently on D10.  VTE Prophylaxis: lovenox subq  Code Status:  Full  Family Communication: Spoke with the patient at bedside.  I tried  to call the patient's sisters but was unable to reach both of them.  Disposition Plan:  . Patient is from home . Likely disposition to home in 2 to 3 days . Barriers to discharge: oral surgery intervention and followup, IV antibiotics   Consultants:  Oral surgery  Infectious disease  Procedures:  PICC line placement on 02/21/2020  Antibiotics:  . Vancomycin and Zosyn 2/22>  Anti-infectives (From admission, onward)   Start     Dose/Rate Route Frequency Ordered Stop   02/21/20 0000  vancomycin (VANCOREADY) IVPB 750 mg/150 mL     750 mg 150 mL/hr over 60 Minutes Intravenous Every 24 hours 02/20/20 0742     02/20/20 1000  acyclovir (ZOVIRAX) tablet 400 mg     400 mg Oral 2 times daily 02/20/20 0630     02/20/20 0800  piperacillin-tazobactam (ZOSYN) IVPB 3.375 g     3.375 g 12.5 mL/hr over 240 Minutes Intravenous Every 8 hours 02/20/20 0422     02/19/20 2330  piperacillin-tazobactam (ZOSYN) IVPB 3.375 g     3.375 g 100 mL/hr over  30 Minutes Intravenous  Once 02/19/20 2327 02/20/20 0043   02/19/20 2330  vancomycin (VANCOCIN) IVPB 1000 mg/200 mL premix     1,000 mg 200 mL/hr over 60 Minutes Intravenous  Once 02/19/20 2327 02/20/20 0142   02/19/20 2215  cefTRIAXone (ROCEPHIN) 1 g in sodium chloride 0.9 % 100 mL IVPB     1 g 200 mL/hr over 30 Minutes Intravenous  Once 02/19/20 2205 02/19/20 2311     Subjective: Today, patient was seen and examined at bedside.  Complains of mild discomfort in  the right.  Denies shortness of breath cough nausea vomiting or abdominal pain.  Objective: Vitals:   02/21/20 1933 02/22/20 0528  BP: (!) 160/66 116/68  Pulse: 60 61  Resp: 16 12  Temp: 97.9 F (36.6 C) 98.4 F (36.9 C)  SpO2: 96% 99%    Intake/Output Summary (Last 24 hours) at 02/22/2020 1014 Last data filed at 02/22/2020 0600 Gross per 24 hour  Intake 3201.24 ml  Output 600 ml  Net 2601.24 ml   Filed Weights   02/19/20 1828 02/20/20 0928  Weight: 70.8 kg 59.7 kg   Body mass index is 21.9 kg/m.   Physical Exam: GENERAL: Patient is alert awake and communicative. Not in obvious distress. HENT: No scleral pallor or icterus. Pupils equally reactive to light. Oral mucosa is moist.  Right  submandibular area with induration and drainage and tenderness.  Fistulous opening with drainage.  Poor dentition NECK: is supple, no gross swelling noted. CHEST: Clear to auscultation. No crackles or wheezes.  Diminished breath sounds bilaterally. CVS: S1 and S2 heard, no murmur. Regular rate and rhythm.  ABDOMEN: Soft, non-tender, bowel sounds are present. EXTREMITIES: No edema.  Right upper extremity PICC in place CNS: Cranial nerves are intact. No focal motor deficits. SKIN: warm and dry.  Right submandibular area induration and swelling, fistulous opening with drainage  Data Review: I have personally reviewed the following laboratory data and studies,  CBC: Recent Labs  Lab 02/19/20 1930 02/20/20 0347 02/22/20 0322  WBC 5.1 4.8 3.3*  NEUTROABS 4.0 3.4  --   HGB 12.1 10.2* 9.4*  HCT 37.4 32.3* 30.2*  MCV 91.4 93.6 94.1  PLT 131* 123* 211*   Basic Metabolic Panel: Recent Labs  Lab 02/19/20 1930 02/20/20 0347 02/20/20 1342 02/21/20 0749 02/22/20 0322  NA 138 138 137 139 139  K 2.5* 2.7* 3.9 2.8* 3.3*  CL 106 108 111 110 108  CO2 18* 18* 17* 18* 21*  GLUCOSE 140* 144* 144* 126* 92  BUN 38* 28* 23 13 5*  CREATININE 1.45* 1.15* 1.07* 0.95 0.79  CALCIUM 8.2* 7.6* 7.5*  7.2* 7.1*  MG 1.3*  --   --  1.3* 1.4*  PHOS 1.3*  --  1.0* 2.4* 2.1*   Liver Function Tests: Recent Labs  Lab 02/19/20 1930 02/20/20 0347 02/21/20 0749 02/22/20 0322  AST 14* 13* 10* 11*  ALT '10 8 7 7  ' ALKPHOS 75 65 59 56  BILITOT 0.9 0.8 1.0 1.2  PROT 6.7 6.0* 5.6* 5.9*  ALBUMIN 3.2* 2.8* 2.5* 2.6*   No results for input(s): LIPASE, AMYLASE in the last 168 hours. No results for input(s): AMMONIA in the last 168 hours. Cardiac Enzymes: No results for input(s): CKTOTAL, CKMB, CKMBINDEX, TROPONINI in the last 168 hours. BNP (last 3 results) No results for input(s): BNP in the last 8760 hours.  ProBNP (last 3 results) No results for input(s): PROBNP in the last 8760 hours.  CBG: Recent Labs  Lab 02/21/20 0753 02/21/20 1139 02/21/20 1651 02/21/20 2154 02/22/20 0759  GLUCAP 121* 63* 73 95 72   Recent Results (from the past 240 hour(s))  Urine Culture     Status: Abnormal   Collection Time: 02/19/20  9:40 PM   Specimen: Urine, Clean Catch  Result Value Ref Range Status   Specimen Description   Final    URINE, CLEAN CATCH Performed at [facility], [address] [place], [place] [address]    Special Requests   Final    NONE Performed at [facility], [address], [place], [place] [address]    Culture MULTIPLE SPECIES PRESENT, SUGGEST RECOLLECTION (A)  Final   Report Status 02/21/2020 FINAL  Final  SARS CORONAVIRUS 2 (TAT 6-24 HRS) Nasopharyngeal Nasopharyngeal Swab     Status: None   Collection Time: 02/19/20 10:21 PM   Specimen: Nasopharyngeal Swab  Result Value Ref Range Status   SARS Coronavirus 2 NEGATIVE NEGATIVE Final    Comment: (NOTE) SARS-CoV-2 target nucleic acids are NOT DETECTED. The SARS-CoV-2 RNA is generally detectable in upper and lower respiratory specimens during the acute phase of infection. Negative results do not preclude SARS-CoV-2 infection, do not rule out co-infections with other pathogens, and should not be used as the sole basis  for treatment or other patient management decisions. Negative results must be combined with clinical observations, patient history, and epidemiological information. The expected result is Negative. Fact Sheet for Patients: SugarRoll.be Fact Sheet for Healthcare Providers: https://www.woods-mathews.com/ This test is not yet approved or cleared by the [place] FDA and  has been authorized for detection and/or diagnosis of SARS-CoV-2 by FDA under an Emergency Use Authorization (EUA). This EUA will remain  in effect (meaning this test can be used) for the duration of the COVID-19 declaration under Section 56 4(b)(1) of the Act, 21 U.S.C. section 360bbb-3(b)(1), unless the authorization is terminated or revoked sooner. Performed at [facility], [place] [address], [place], [place] [address]   Aerobic/Anaerobic Culture (surgical/deep wound)     Status: None (Preliminary result)   Collection Time: 02/19/20 11:24 PM   Specimen: Face  Result Value Ref Range Status   Specimen Description   Final    FACE Performed at [facility], [address] [place], [place] [address]    Special Requests   Final    Immunocompromised Performed at [facility], [address] [place], [place] [address]    Gram Stain   Final    FEW WBC PRESENT, PREDOMINANTLY PMN MODERATE GRAM POSITIVE COCCI IN CHAINS IN CLUSTERS RARE GRAM POSITIVE RODS    Culture   Final    RARE STAPHYLOCOCCUS LUGDUNENSIS SUSCEPTIBILITIES TO FOLLOW Performed at [facility], [place] [address], [place],  [address]    Report Status PENDING  Incomplete  C difficile quick scan w PCR reflex     Status: None   Collection Time: 02/22/20 12:30 AM   Specimen: STOOL  Result Value Ref Range Status   C Diff antigen NEGATIVE NEGATIVE Final   C Diff toxin NEGATIVE NEGATIVE Final   C Diff interpretation No C. difficile detected.  Final    Comment: Performed at [facility], [place] [address], [place],  [address]  Surgical pcr screen     Status: None   Collection Time: 02/22/20  6:12 AM   Specimen: Nasal Mucosa; Nasal Swab  Result Value Ref Range Status   MRSA, PCR NEGATIVE NEGATIVE Final   Staphylococcus aureus NEGATIVE  NEGATIVE Final    Comment: (NOTE) The Xpert SA Assay (FDA approved for NASAL specimens in patients 59 years of age and older), is one component of a comprehensive surveillance program. It is not intended to diagnose infection nor to guide or monitor treatment. Performed at [facility], [place] [address], [place], [place] [address]      Studies: [place] EKG Site Rite  Result Date: 02/21/2020 If [facility] image not attached, placement could not be confirmed due to current cardiac rhythm.     Flora Lipps, MD  [facility] 02/22/2020

## 2020-02-22 NOTE — Anesthesia Preprocedure Evaluation (Signed)
Anesthesia Evaluation  Patient identified by MRN, date of birth, ID band Patient awake    Reviewed: Allergy & Precautions, NPO status , Patient's Chart, lab work & pertinent test results  Airway Mallampati: I  TM Distance: >3 FB Neck ROM: Full  Mouth opening: Limited Mouth Opening  Dental   Pulmonary    Pulmonary exam normal        Cardiovascular hypertension, Pt. on medications Normal cardiovascular exam     Neuro/Psych    GI/Hepatic   Endo/Other  diabetes, Type 2, Oral Hypoglycemic Agents  Renal/GU Renal InsufficiencyRenal disease     Musculoskeletal   Abdominal   Peds  Hematology   Anesthesia Other Findings   Reproductive/Obstetrics                             Anesthesia Physical Anesthesia Plan  ASA: III  Anesthesia Plan: General   Post-op Pain Management:    Induction: Intravenous  PONV Risk Score and Plan: 3  Airway Management Planned: Nasal ETT  Additional Equipment:   Intra-op Plan:   Post-operative Plan: Extubation in OR  Informed Consent: I have reviewed the patients History and Physical, chart, labs and discussed the procedure including the risks, benefits and alternatives for the proposed anesthesia with the patient or authorized representative who has indicated his/her understanding and acceptance.       Plan Discussed with: CRNA and Surgeon  Anesthesia Plan Comments:         Anesthesia Quick Evaluation

## 2020-02-23 ENCOUNTER — Encounter: Payer: Self-pay | Admitting: *Deleted

## 2020-02-23 DIAGNOSIS — M879 Osteonecrosis, unspecified: Secondary | ICD-10-CM

## 2020-02-23 DIAGNOSIS — M8710 Osteonecrosis due to drugs, unspecified bone: Secondary | ICD-10-CM

## 2020-02-23 DIAGNOSIS — M272 Inflammatory conditions of jaws: Secondary | ICD-10-CM

## 2020-02-23 DIAGNOSIS — T50995A Adverse effect of other drugs, medicaments and biological substances, initial encounter: Secondary | ICD-10-CM

## 2020-02-23 LAB — CBC
HCT: 31.4 % — ABNORMAL LOW (ref 36.0–46.0)
Hemoglobin: 10 g/dL — ABNORMAL LOW (ref 12.0–15.0)
MCH: 29.7 pg (ref 26.0–34.0)
MCHC: 31.8 g/dL (ref 30.0–36.0)
MCV: 93.2 fL (ref 80.0–100.0)
Platelets: 144 10*3/uL — ABNORMAL LOW (ref 150–400)
RBC: 3.37 MIL/uL — ABNORMAL LOW (ref 3.87–5.11)
RDW: 18.6 % — ABNORMAL HIGH (ref 11.5–15.5)
WBC: 6.4 10*3/uL (ref 4.0–10.5)
nRBC: 0 % (ref 0.0–0.2)

## 2020-02-23 LAB — BASIC METABOLIC PANEL
Anion gap: 12 (ref 5–15)
BUN: <5 mg/dL — ABNORMAL LOW (ref 8–23)
CO2: 23 mmol/L (ref 22–32)
Calcium: 6.6 mg/dL — ABNORMAL LOW (ref 8.9–10.3)
Chloride: 103 mmol/L (ref 98–111)
Creatinine, Ser: 0.76 mg/dL (ref 0.44–1.00)
GFR calc Af Amer: >60 mL/min (ref >60)
GFR calc non Af Amer: >60 mL/min (ref >60)
Glucose, Bld: 263 mg/dL — ABNORMAL HIGH (ref 70–99)
Potassium: 4.1 mmol/L (ref 3.5–5.1)
Sodium: 138 mmol/L (ref 135–145)

## 2020-02-23 LAB — GLUCOSE, CAPILLARY
Glucose-Capillary: 213 mg/dL — ABNORMAL HIGH (ref 70–99)
Glucose-Capillary: 146 mg/dL — ABNORMAL HIGH (ref 70–99)
Glucose-Capillary: 76 mg/dL (ref 70–99)
Glucose-Capillary: 158 mg/dL — ABNORMAL HIGH (ref 70–99)

## 2020-02-23 LAB — PHOSPHORUS: Phosphorus: 2.1 mg/dL — ABNORMAL LOW (ref 2.5–4.6)

## 2020-02-23 LAB — MAGNESIUM: Magnesium: 1.4 mg/dL — ABNORMAL LOW (ref 1.7–2.4)

## 2020-02-23 MED ORDER — TRAMADOL HCL 50 MG PO TABS
50.0000 mg | ORAL_TABLET | Freq: Four times a day (QID) | ORAL | Status: AC | PRN
  Administered 2020-02-23 – 2020-02-27 (×2): 50 mg via ORAL
  Filled 2020-02-23 (×2): qty 1

## 2020-02-23 MED ORDER — DEXTROSE 5 % IV SOLN: INTRAVENOUS | Status: DC

## 2020-02-23 MED ORDER — POTASSIUM PHOSPHATES 15 MMOLE/5ML IV SOLN
30.0000 mmol | Freq: Once | INTRAVENOUS | Status: AC
  Administered 2020-02-23: 30 mmol via INTRAVENOUS
  Filled 2020-02-23: qty 10

## 2020-02-23 MED ORDER — ENOXAPARIN SODIUM 40 MG/0.4ML ~~LOC~~ SOLN
40.0000 mg | SUBCUTANEOUS | Status: DC
  Administered 2020-02-23 – 2020-02-27 (×5): 40 mg via SUBCUTANEOUS
  Filled 2020-02-23 (×5): qty 0.4

## 2020-02-23 MED ORDER — CHLORHEXIDINE GLUCONATE 0.12 % MT SOLN
15.0000 mL | Freq: Three times a day (TID) | OROMUCOSAL | Status: DC
  Administered 2020-02-23 – 2020-02-27 (×13): 15 mL via OROMUCOSAL
  Filled 2020-02-23 (×13): qty 15

## 2020-02-23 MED ORDER — MAGNESIUM OXIDE 400 (241.3 MG) MG PO TABS
400.0000 mg | ORAL_TABLET | Freq: Two times a day (BID) | ORAL | Status: AC
  Administered 2020-02-23 – 2020-02-25 (×6): 400 mg via ORAL
  Filled 2020-02-23 (×6): qty 1

## 2020-02-23 MED ORDER — MAGNESIUM SULFATE 2 GM/50ML IV SOLN
2.0000 g | Freq: Once | INTRAVENOUS | Status: AC
  Administered 2020-02-23: 09:00:00 2 g via INTRAVENOUS
  Filled 2020-02-23: qty 50

## 2020-02-23 NOTE — Progress Notes (Signed)
INFECTIOUS DISEASE PROGRESS NOTE  ID: Brittany Archer is a 79 y.o. female with  Active Problems:   Hypokalemia   Type 2 diabetes mellitus ([place])   Coronary artery disease   Hypertension   Stage 3b chronic kidney disease   Hypomagnesemia   Hypophosphatemia   Facial cellulitis   Confusion  Subjective: No complaints, denies pain. Denies med side effects- diarrhea.  Has been able to eat.   Abtx:  Anti-infectives (From admission, onward)   Start     Dose/Rate Route Frequency Ordered Stop   02/21/20 0000  vancomycin (VANCOREADY) IVPB 750 mg/150 mL     750 mg 150 mL/hr over 60 Minutes Intravenous Every 24 hours 02/20/20 0742     02/20/20 1000  acyclovir (ZOVIRAX) tablet 400 mg     400 mg Oral 2 times daily 02/20/20 0630     02/20/20 0800  piperacillin-tazobactam (ZOSYN) IVPB 3.375 g     3.375 g 12.5 mL/hr over 240 Minutes Intravenous Every 8 hours 02/20/20 0422     02/19/20 2330  piperacillin-tazobactam (ZOSYN) IVPB 3.375 g     3.375 g 100 mL/hr over 30 Minutes Intravenous  Once 02/19/20 2327 02/20/20 0043   02/19/20 2330  vancomycin (VANCOCIN) IVPB 1000 mg/200 mL premix     1,000 mg 200 mL/hr over 60 Minutes Intravenous  Once 02/19/20 2327 02/20/20 0142   02/19/20 2215  cefTRIAXone (ROCEPHIN) 1 g in sodium chloride 0.9 % 100 mL IVPB     1 g 200 mL/hr over 30 Minutes Intravenous  Once 02/19/20 2205 02/19/20 2311      Medications:  Scheduled: . acyclovir  400 mg Oral BID  . aspirin  81 mg Oral Daily  . chlorhexidine  15 mL Mouth/Throat TID  . Chlorhexidine Gluconate Cloth  6 each Topical Daily  . enoxaparin (LOVENOX) injection  40 mg Subcutaneous Q24H  . insulin aspart  0-15 Units Subcutaneous TID WC  . magnesium oxide  400 mg Oral BID  . sodium chloride flush  10-40 mL Intracatheter Q12H    Objective: Vital signs in last 24 hours: Temp:  [97.4 F (36.3 C)-97.9 F (36.6 C)] 97.9 F (36.6 C) (02/26 0458) Pulse Rate:  [56-79] 68 (02/26 0458) Resp:  [13-17] 17  (02/26 0458) BP: (106-159)/(54-96) 106/56 (02/26 0458) SpO2:  [98 %-100 %] 99 % (02/26 0458)   General appearance: alert, cooperative and no distress Neck: submental area dressed.  Resp: clear to auscultation bilaterally Cardio: regular rate and rhythm GI: normal findings: bowel sounds normal and soft, non-tender  Lab Results Recent Labs    02/22/20 0322 02/23/20 0449  WBC 3.3* 6.4  HGB 9.4* 10.0*  HCT 30.2* 31.4*  NA 139 138  K 3.3* 4.1  CL 108 103  CO2 21* 23  BUN 5* <5*  CREATININE 0.79 0.76   Liver Panel Recent Labs    02/21/20 0749 02/22/20 0322  PROT 5.6* 5.9*  ALBUMIN 2.5* 2.6*  AST 10* 11*  ALT 7 7  ALKPHOS 59 56  BILITOT 1.0 1.2   Sedimentation Rate No results for input(s): ESRSEDRATE in the last 72 hours. C-Reactive Protein No results for input(s): CRP in the last 72 hours.  Microbiology: Recent Results (from the past 240 hour(s))  Urine Culture     Status: Abnormal   Collection Time: 02/19/20  9:40 PM   Specimen: Urine, Clean Catch  Result Value Ref Range Status   Specimen Description   Final    URINE, CLEAN CATCH Performed at  [facility], [address], [place], [place] [address]    Special Requests   Final    NONE Performed at [facility], [address], [place], [place] [address]    Culture MULTIPLE SPECIES PRESENT, SUGGEST RECOLLECTION (A)  Final   Report Status 02/21/2020 FINAL  Final  SARS CORONAVIRUS 2 (TAT 6-24 HRS) Nasopharyngeal Nasopharyngeal Swab     Status: None   Collection Time: 02/19/20 10:21 PM   Specimen: Nasopharyngeal Swab  Result Value Ref Range Status   SARS Coronavirus 2 NEGATIVE NEGATIVE Final    Comment: (NOTE) SARS-CoV-2 target nucleic acids are NOT DETECTED. The SARS-CoV-2 RNA is generally detectable in upper and lower respiratory specimens during the acute phase of infection. Negative results do not preclude SARS-CoV-2 infection, do not rule out co-infections with other pathogens, and should not be used as  the sole basis for treatment or other patient management decisions. Negative results must be combined with clinical observations, patient history, and epidemiological information. The expected result is Negative. Fact Sheet for Patients: SugarRoll.be Fact Sheet for Healthcare Providers: https://www.woods-mathews.com/ This test is not yet approved or cleared by the [place] FDA and  has been authorized for detection and/or diagnosis of SARS-CoV-2 by FDA under an Emergency Use Authorization (EUA). This EUA will remain  in effect (meaning this test can be used) for the duration of the COVID-19 declaration under Section 56 4(b)(1) of the Act, 21 U.S.C. section 360bbb-3(b)(1), unless the authorization is terminated or revoked sooner. Performed at [facility], [place] [address] [place], [place] [address]   Aerobic/Anaerobic Culture (surgical/deep wound)     Status: None (Preliminary result)   Collection Time: 02/19/20 11:24 PM   Specimen: Face  Result Value Ref Range Status   Specimen Description   Final    FACE Performed at [facility], [address] [place], [place] [address]    Special Requests   Final    Immunocompromised Performed at [facility], [address] [place], [place] [address]    Gram Stain   Final    FEW WBC PRESENT, PREDOMINANTLY PMN MODERATE GRAM POSITIVE COCCI IN CHAINS IN CLUSTERS RARE GRAM POSITIVE RODS    Culture   Final    RARE STAPHYLOCOCCUS LUGDUNENSIS SUSCEPTIBILITIES TO FOLLOW HOLDING FOR POSSIBLE ANAEROBE Performed at [facility], [place] [address] [place], [place] [address]    Report Status PENDING  Incomplete  C difficile quick scan w PCR reflex     Status: None   Collection Time: 02/22/20 12:30 AM   Specimen: STOOL  Result Value Ref Range Status   C Diff antigen NEGATIVE NEGATIVE Final   C Diff toxin NEGATIVE NEGATIVE Final   C Diff interpretation No C. difficile detected.  Final     Comment: Performed at [facility], [place] [address], [place], [place] [address]  Surgical pcr screen     Status: None   Collection Time: 02/22/20  6:12 AM   Specimen: Nasal Mucosa; Nasal Swab  Result Value Ref Range Status   MRSA, PCR NEGATIVE NEGATIVE Final   Staphylococcus aureus NEGATIVE NEGATIVE Final    Comment: (NOTE) The Xpert SA Assay (FDA approved for NASAL specimens in patients 66 years of age and older), is one component of a comprehensive surveillance program. It is not intended to diagnose infection nor to guide or monitor treatment. Performed at [facility], [place] [address], [place], [place] [address]   Aerobic/Anaerobic Culture (surgical/deep wound)  Status: None (Preliminary result)   Collection Time: 02/22/20  5:30 PM   Specimen: PATH Other; Tissue  Result Value Ref Range Status   Specimen Description   Final    ORAL EXTRA ORAL FISTULA Performed at [place] [address] [place], [place] [address]    Special Requests   Final    NONE Performed at [facility], [place] [address], [place], [place] [address]    Gram Stain   Final    ABUNDANT WBC PRESENT, PREDOMINANTLY PMN NO ORGANISMS SEEN Performed at [facility], [place] [address], [place], [place] [address]    Culture PENDING  Incomplete   Report Status PENDING  Incomplete  Aerobic/Anaerobic Culture (surgical/deep wound)     Status: None (Preliminary result)   Collection Time: 02/22/20  5:31 PM   Specimen: PATH Other; Tissue  Result Value Ref Range Status   Specimen Description   Final    MANDIBLE INTRAORAL RIGHT MANDIBULAR Performed at [place] [address], [place], [place] [address]    Special Requests   Final    NONE Performed at [facility], [place] [address], [place], [place] [address]    Gram Stain   Final    FEW WBC PRESENT, PREDOMINANTLY PMN RARE GRAM VARIABLE  ROD Performed at [facility], [place] [address] [place], [place] [address]    Culture PENDING  Incomplete   Report Status PENDING  Incomplete    Studies/Results: No results found.   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           Bobby Rumpf MD, FACP Infectious Diseases (pager) [phone] [email or web address] 02/23/2020, 11:13 AM  LOS: 4 days

## 2020-02-23 NOTE — Progress Notes (Signed)
PROGRESS NOTE  Brittany Archer [facility] UXN:[id number] DOB: 1941/02/18 DOA: 02/19/2020 PCP: [name], PA-C   LOS: 4 days   Brief narrative: As per HPI,  79 years old female with history of left breast cancer, CAD, type 2 diabetes mellitus,, HTN presented to the ER with worsening pain in addition to her abscessed tooth going on for 2 weeks with pain generalized weakness and also had a fall prior to admission. In the ED vitals were stable, UA WBC 11-20, CBC with WBC count 5.1,hemoglobin 12.1 g/dL and platelets 131. CMP showed a potassium of 2.5 and CO2 of 18 mmol/L. Her anion gap was 14. Glucose 140, BUN 38 and creatinine 1.45 mg/dL. Total protein is 6.7 albumin 3.2 g/dL. CT head did not show any acute intracranial abnormality. Maxillofacial CT was ordered and showed findings suspicious of osteomyelitis.  Patient was started on vancomycin and Zosyn and was admitted to the hospital. She is on chemo for multiple myeloma.   Assessment/Plan:  Active Problems:   Hypokalemia   Type 2 diabetes mellitus (HCC)   Coronary artery disease   Hypertension   Stage 3b chronic kidney disease   Hypomagnesemia   Hypophosphatemia   Facial cellulitis   Confusion   Acute osteomyelitis of the mandible secondary to dental abscess with facial cellulitis.  CT/MRI scan shows phlegmon and osteomyelitis.   On vancomycin and Zosyn. Status post incision and drainage and debridement of mandibular osteomyelitis with removal of multiple tooth.  Dr [name] oral Psychologist, sport and exercise on board.  Drain to be continued for next few days  Pure if tolerated.  Continue IV antibiotics.  ID has been consulted for prolonged antibiotics.  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  Holding denosumab.  Multiple Myeloma on Xgeva q 28 days and Velcade weekly. Xgeva on hold  Hypophosphatemia/Hypomagnesemia Continue to replenish IV.  Check levels in a.m.  Hypokalemia.  Improved.  Check BMP in a.m.  Type 2  diabetes mellitus Hold Metformin and glipizide.  Continue sliding scale insulin, Accu-Cheks, hemoglobin A1c of 7.6.  On D5 water, will decrease to 50 mL/h today.  Last POC glucose was 213.  Coronary artery disease: On aspirin.  No acute chest pain.  Hypertension.  on lisinopril/HCTZ at home. IV antihypertensives as needed .  Continue to hold lisinopril HCTZ.  Stage 3b chronic kidney disease.  Continue gentle IV fluid hydration for next 1 day.,.  Hold lisinopril HCTZ  VTE Prophylaxis: lovenox subq  Code Status:  Full  Family Communication: Spoke with the patient at bedside.  I spoke with the patient's her sister Brittany Archer and updated her about the clinical condition of the patient.  Disposition Plan:  . Patient is from home . Likely disposition to home in 3 to 4 days days.  Will get physical therapy evaluation. . Barriers to discharge: IV antibiotics oral surgery follow-up  Consultants:  Oral surgery  Infectious disease  Procedures:  PICC line placement on 02/21/2020  Submental abscess, extraction of remaining mandibular teeth, mandibular debridment, closure of submental cutaneous fistula by oral surgery on 02/22/2020.  Antibiotics:  . Vancomycin and Zosyn 2/22>  Anti-infectives (From admission, onward)   Start     Dose/Rate Route Frequency Ordered Stop   02/21/20 0000  vancomycin (VANCOREADY) IVPB 750 mg/150 mL     750 mg 150 mL/hr over 60 Minutes Intravenous Every 24 hours 02/20/20 0742     02/20/20 1000  acyclovir (ZOVIRAX) tablet 400 mg     400 mg Oral 2 times  daily 02/20/20 0630     02/20/20 0800  piperacillin-tazobactam (ZOSYN) IVPB 3.375 g     3.375 g 12.5 mL/hr over 240 Minutes Intravenous Every 8 hours 02/20/20 0422     02/19/20 2330  piperacillin-tazobactam (ZOSYN) IVPB 3.375 g     3.375 g 100 mL/hr over 30 Minutes Intravenous  Once 02/19/20 2327 02/20/20 0043   02/19/20 2330  vancomycin (VANCOCIN) IVPB 1000 mg/200 mL premix     1,000 mg 200 mL/hr over 60  Minutes Intravenous  Once 02/19/20 2327 02/20/20 0142   02/19/20 2215  cefTRIAXone (ROCEPHIN) 1 g in sodium chloride 0.9 % 100 mL IVPB     1 g 200 mL/hr over 30 Minutes Intravenous  Once 02/19/20 2205 02/19/20 2311     Subjective: Today, patient denies overt pain in the jaw, no fever. Had bowel movement.  Denies any chest pain, shortness of breath, cough or fever.  Objective: Vitals:   02/22/20 2052 02/23/20 0458  BP: (!) 112/54 (!) 106/56  Pulse: 66 68  Resp: 16 17  Temp: (!) 97.4 F (36.3 C) 97.9 F (36.6 C)  SpO2: 98% 99%    Intake/Output Summary (Last 24 hours) at 02/23/2020 1034 Last data filed at 02/23/2020 1033 Gross per 24 hour  Intake 1043.02 ml  Output 1000 ml  Net 43.02 ml   Filed Weights   02/19/20 1828 02/20/20 0928  Weight: 70.8 kg 59.7 kg   Body mass index is 21.9 kg/m.   Physical Exam: General:  Average built, not in obvious distress HENT: Normocephalic, pupils equally reacting to light and accommodation.  No scleral pallor or icterus noted.  Right submandibular/submental area status post surgery with dressing. Chest:  Clear breath sounds.  Diminished breath sounds bilaterally. No crackles or wheezes.  CVS: S1 &S2 heard. No murmur.  Regular rate and rhythm. Abdomen: Soft, nontender, nondistended.  Bowel sounds are heard.  Extremities: No cyanosis, clubbing or edema.  Peripheral pulses are palpable.  Right upper extremity PICC line in place Psych: Alert, awake and oriented, normal mood CNS:  No cranial nerve deficits.  Power equal in all extremities.  No sensory deficits noted.  No cerebellar signs.   Skin: Warm and dry.  Right submandibular/submental area with dressing.  Data Review: I have personally reviewed the following laboratory data and studies,  CBC: Recent Labs  Lab 02/19/20 1930 02/20/20 0347 02/22/20 0322 02/23/20 0449  WBC 5.1 4.8 3.3* 6.4  NEUTROABS 4.0 3.4  --   --   HGB 12.1 10.2* 9.4* 10.0*  HCT 37.4 32.3* 30.2* 31.4*  MCV 91.4  93.6 94.1 93.2  PLT 131* 123* 123* 448*   Basic Metabolic Panel: Recent Labs  Lab 02/19/20 1930 02/19/20 1930 02/20/20 0347 02/20/20 1342 02/21/20 0749 02/22/20 0322 02/23/20 0449  NA 138   < > 138 137 139 139 138  K 2.5*   < > 2.7* 3.9 2.8* 3.3* 4.1  CL 106   < > 108 111 110 108 103  CO2 18*   < > 18* 17* 18* 21* 23  GLUCOSE 140*   < > 144* 144* 126* 92 263*  BUN 38*   < > 28* 23 13 5* <5*  CREATININE 1.45*   < > 1.15* 1.07* 0.95 0.79 0.76  CALCIUM 8.2*   < > 7.6* 7.5* 7.2* 7.1* 6.6*  MG 1.3*  --   --   --  1.3* 1.4* 1.4*  PHOS 1.3*  --   --  1.0* 2.4* 2.1* 2.1*   < > =  values in this interval not displayed.   Liver Function Tests: Recent Labs  Lab 02/19/20 1930 02/20/20 0347 02/21/20 0749 02/22/20 0322  AST 14* 13* 10* 11*  ALT '10 8 7 7  ' ALKPHOS 75 65 59 56  BILITOT 0.9 0.8 1.0 1.2  PROT 6.7 6.0* 5.6* 5.9*  ALBUMIN 3.2* 2.8* 2.5* 2.6*   No results for input(s): LIPASE, AMYLASE in the last 168 hours. No results for input(s): AMMONIA in the last 168 hours. Cardiac Enzymes: No results for input(s): CKTOTAL, CKMB, CKMBINDEX, TROPONINI in the last 168 hours. BNP (last 3 results) No results for input(s): BNP in the last 8760 hours.  ProBNP (last 3 results) No results for input(s): PROBNP in the last 8760 hours.  CBG: Recent Labs  Lab 02/22/20 1147 02/22/20 1627 02/22/20 1918 02/22/20 2053 02/23/20 0805  GLUCAP 107* 82 164* 189* 213*   Recent Results (from the past 240 hour(s))  Urine Culture     Status: Abnormal   Collection Time: 02/19/20  9:40 PM   Specimen: Urine, Clean Catch  Result Value Ref Range Status   Specimen Description   Final    URINE, CLEAN CATCH Performed at [facility], [address] [place], [place] [address]    Special Requests   Final    NONE Performed at [facility], [address], [place], [place] [address]    Culture MULTIPLE SPECIES PRESENT, SUGGEST RECOLLECTION (A)  Final   Report Status 02/21/2020 FINAL  Final  SARS  CORONAVIRUS 2 (TAT 6-24 HRS) Nasopharyngeal Nasopharyngeal Swab     Status: None   Collection Time: 02/19/20 10:21 PM   Specimen: Nasopharyngeal Swab  Result Value Ref Range Status   SARS Coronavirus 2 NEGATIVE NEGATIVE Final    Comment: (NOTE) SARS-CoV-2 target nucleic acids are NOT DETECTED. The SARS-CoV-2 RNA is generally detectable in upper and lower respiratory specimens during the acute phase of infection. Negative results do not preclude SARS-CoV-2 infection, do not rule out co-infections with other pathogens, and should not be used as the sole basis for treatment or other patient management decisions. Negative results must be combined with clinical observations, patient history, and epidemiological information. The expected result is Negative. Fact Sheet for Patients: SugarRoll.be Fact Sheet for Healthcare Providers: https://www.woods-mathews.com/ This test is not yet approved or cleared by the [place] FDA and  has been authorized for detection and/or diagnosis of SARS-CoV-2 by FDA under an Emergency Use Authorization (EUA). This EUA will remain  in effect (meaning this test can be used) for the duration of the COVID-19 declaration under Section 56 4(b)(1) of the Act, 21 U.S.C. section 360bbb-3(b)(1), unless the authorization is terminated or revoked sooner. Performed at [facility], [place] [address] [place], [place] [address]   Aerobic/Anaerobic Culture (surgical/deep wound)     Status: None (Preliminary result)   Collection Time: 02/19/20 11:24 PM   Specimen: Face  Result Value Ref Range Status   Specimen Description   Final    FACE Performed at [facility], [address], [place], [place] [address]    Special Requests   Final    Immunocompromised Performed at [facility], [address], [place], [place] [address]    Gram Stain   Final    FEW WBC PRESENT, PREDOMINANTLY PMN MODERATE GRAM POSITIVE COCCI IN CHAINS  IN CLUSTERS RARE GRAM POSITIVE RODS    Culture   Final    RARE STAPHYLOCOCCUS LUGDUNENSIS SUSCEPTIBILITIES TO FOLLOW HOLDING FOR POSSIBLE ANAEROBE Performed at [facility], [place] [address] [place], [place] [address]    Report Status PENDING  Incomplete  C difficile quick scan w PCR reflex     Status: None   Collection Time: 02/22/20 12:30 AM   Specimen: STOOL  Result Value Ref Range Status   C Diff antigen NEGATIVE NEGATIVE Final   C Diff toxin NEGATIVE NEGATIVE Final   C Diff interpretation No C. difficile detected.  Final    Comment: Performed at [facility], [place] [address], [place], [place] [address]  Surgical pcr screen     Status: None   Collection Time: 02/22/20  6:12 AM   Specimen: Nasal Mucosa; Nasal Swab  Result Value Ref Range Status   MRSA, PCR NEGATIVE NEGATIVE Final   Staphylococcus aureus NEGATIVE NEGATIVE Final    Comment: (NOTE) The Xpert SA Assay (FDA approved for NASAL specimens in patients 49 years of age and older), is one component of a comprehensive surveillance program. It is not intended to diagnose infection nor to guide or monitor treatment. Performed at [facility], [place] [address], [place], [place] [address]   Aerobic/Anaerobic Culture (surgical/deep wound)     Status: None (Preliminary result)   Collection Time: 02/22/20  5:30 PM   Specimen: PATH Other; Tissue  Result Value Ref Range Status   Specimen Description   Final    ORAL EXTRA ORAL FISTULA Performed at [place] [address] [place], [place] [address]    Special Requests   Final    NONE Performed at [facility], [place] [address] [place], [place] [address]    Gram Stain   Final    ABUNDANT WBC PRESENT, PREDOMINANTLY PMN NO ORGANISMS SEEN Performed at [facility], [place] [address], [place], [place] [address]    Culture PENDING  Incomplete   Report Status PENDING  Incomplete    Aerobic/Anaerobic Culture (surgical/deep wound)     Status: None (Preliminary result)   Collection Time: 02/22/20  5:31 PM   Specimen: PATH Other; Tissue  Result Value Ref Range Status   Specimen Description   Final    MANDIBLE INTRAORAL RIGHT MANDIBULAR Performed at [place] [address], [place], [place] [address]    Special Requests   Final    NONE Performed at [facility], [place] [address], [place], [place] [address]    Gram Stain   Final    FEW WBC PRESENT, PREDOMINANTLY PMN RARE GRAM VARIABLE ROD Performed at [facility], [place] [address] [place], [place] [address]    Culture PENDING  Incomplete   Report Status PENDING  Incomplete     Studies: No results found.    Flora Lipps, MD  [facility] 02/23/2020

## 2020-02-23 NOTE — Progress Notes (Signed)
Pharmacy Antibiotic Note  Brittany Archer is a 79 y.o. female admitted on 02/19/2020 with osteomyelitis of jaw.  Pharmacy has been consulted for vancomycin and zosyn dosing. 02/23/2020  Abx D#4, AF, WBC WNL, POD#1 s/p I&D submental abscess, extraction of remaining mandibular teeth, mandibular debridment, closure of submental cutaneous fistula 2/22 wound cx: rare staplyococcus lugdenesis 2/25 OR cultures: 1 gram stain with gram variable rod, other 2 gram stain negative, cultures in process PICC line placed 2/24 Anticipate 6 weeks abx  Plan: Zosyn 3.375g IV q8h (4 hour infusion).  Vancomycin 750 mg IV Q 24 hrs. Goal AUC 400-550. Expected AUC: 433.4  Css max 28.2/Css min 11.2 SCr used: 0.95 Wt 59.7 kg F/u ID recs: if long-term vancomycin planned will order peak/trough  Height: 5\' 5"  (165.1 cm) Weight: 131 lb 9.8 oz (59.7 kg) IBW/kg (Calculated) : 57  Temp (24hrs), Avg:97.6 F (36.4 C), Min:97.4 F (36.3 C), Max:97.9 F (36.6 C)  Recent Labs  Lab 02/19/20 1930 02/19/20 1930 02/20/20 0347 02/20/20 1342 02/21/20 0749 02/22/20 0322 02/23/20 0449  WBC 5.1  --  4.8  --   --  3.3* 6.4  CREATININE 1.45*   < > 1.15* 1.07* 0.95 0.79 0.76   < > = values in this interval not displayed.    Estimated Creatinine Clearance: 51.3 mL/min (by C-G formula based on SCr of 0.76 mg/dL).    Allergies  Allergen Reactions  . Motrin [Ibuprofen] Rash   Antimicrobials this admission: Vanco 2/23 >>  Zosyn 2/23 >>  CTX x 1 dose 2/22  Acyclovir as PTA>> Dose adjustments this admission:  Microbiology results: 2/22 UCx: mult sp. F 2/22 wound Cx: gram stain: mod GPC in chains & clusters, rare GProds, rare staphylococcus lugdenesis, holding for possible anaerobe 2/22 HIV NR 2/23 RPR NR 2/25 MRSA PCR neg, MSSA PCR neg 2/25 C diff neg 2/25 mandible intraoral: rare GVR 2/25 Extra oral fistula: gram stain neg  Thank you for allowing pharmacy to be a part of this patient's care.  Eudelia Bunch, Pharm.D [phone] 02/23/2020 9:12 AM

## 2020-02-23 NOTE — Progress Notes (Signed)
PT Cancellation Note  Patient Details Name: Brittany Archer MRN: [id number] DOB: June 04, 1941   Cancelled Treatment:    Reason Eval/Treat Not Completed: Fatigue/lethargy limiting ability to participate;Medical issues which prohibited therapy, reports recently ambulated to [facility] and now feels nauseated. Will check back tomorrow.   Claretha Cooper 02/23/2020, 3:11 PM [place] Pager [phone] Office [phone]

## 2020-02-23 NOTE — Progress Notes (Signed)
Subjective: POD1 s/p I&D submental abscess, extraction of remaining mandibular teeth, mandibular debridment, closure of submental cutaneous fistula. Pt states that she is comfortable, and feels ok. Minimal soreness.  Objective: Vital signs in last 24 hours: Temp:  [97.4 F (36.3 C)-97.9 F (36.6 C)] 97.9 F (36.6 C) (02/26 0458) Pulse Rate:  [56-79] 68 (02/26 0458) Resp:  [13-17] 17 (02/26 0458) BP: (106-159)/(54-96) 106/56 (02/26 0458) SpO2:  [98 %-100 %] 99 % (02/26 0458) Wt Readings from Last 1 Encounters:  02/20/20 59.7 kg    Intake/Output from previous day: 02/25 0701 - 02/26 0700 In: 571 [I.V.:10; IV Piggyback:561] Out: 1000 [Urine:975; Blood:25] Intake/Output this shift: No intake/output data recorded.  PE: Gen: awake, alert, nad HEENT: mild-mod submental and right facial edema - soft, submental dressing in place - unable visualize drains at this time. Intraorally, wounds intact with slow hemorrhage in right posterior mandible. Mild FOM edema. Oropharynx clear.  Recent Labs    02/22/20 0322 02/23/20 0449  WBC 3.3* 6.4  HGB 9.4* 10.0*  HCT 30.2* 31.4*  PLT 123* 144*    Recent Labs    02/22/20 0322 02/23/20 0449  NA 139 138  K 3.3* 4.1  CL 108 103  CO2 21* 23  GLUCOSE 92 263*  BUN 5* <5*  CREATININE 0.79 0.76  CALCIUM 7.1* 6.6*    Medications: I have reviewed the patient's current medications.  Assessment/Plan: POD1 s/p I&D submental abscess, extraction of remaining mandibular teeth, mandibular debridment, closure of submental cutaneous fistula. Cont IV Vanc/Zosyn; cont holding Xgeva inj for now. Pureed diet when able. Chlorhexidine rinses tid until further notice. Will leave penrose drains in for 2-3 days, change submental dressing q8h.   *Please call Dr. [name] at [phone] with questions/concerns.   LOS: 4 days   Michael Litter, DMD Oral & Maxillofacial Surgery 02/23/2020, 7:45 AM

## 2020-02-24 LAB — CBC
HCT: 29 % — ABNORMAL LOW (ref 36.0–46.0)
Hemoglobin: 8.9 g/dL — ABNORMAL LOW (ref 12.0–15.0)
MCH: 29.5 pg (ref 26.0–34.0)
MCHC: 30.7 g/dL (ref 30.0–36.0)
MCV: 96 fL (ref 80.0–100.0)
Platelets: 136 10*3/uL — ABNORMAL LOW (ref 150–400)
RBC: 3.02 MIL/uL — ABNORMAL LOW (ref 3.87–5.11)
RDW: 19.1 % — ABNORMAL HIGH (ref 11.5–15.5)
WBC: 4.1 10*3/uL (ref 4.0–10.5)
nRBC: 0 % (ref 0.0–0.2)

## 2020-02-24 LAB — BASIC METABOLIC PANEL
Anion gap: 12 (ref 5–15)
BUN: 5 mg/dL — ABNORMAL LOW (ref 8–23)
CO2: 23 mmol/L (ref 22–32)
Calcium: 7 mg/dL — ABNORMAL LOW (ref 8.9–10.3)
Chloride: 103 mmol/L (ref 98–111)
Creatinine, Ser: 0.95 mg/dL (ref 0.44–1.00)
GFR calc Af Amer: >60 mL/min (ref >60)
GFR calc non Af Amer: 57 mL/min — ABNORMAL LOW (ref >60)
Glucose, Bld: 151 mg/dL — ABNORMAL HIGH (ref 70–99)
Potassium: 3.4 mmol/L — ABNORMAL LOW (ref 3.5–5.1)
Sodium: 138 mmol/L (ref 135–145)

## 2020-02-24 LAB — PHOSPHORUS: Phosphorus: 2.3 mg/dL — ABNORMAL LOW (ref 2.5–4.6)

## 2020-02-24 LAB — MAGNESIUM: Magnesium: 1.8 mg/dL (ref 1.7–2.4)

## 2020-02-24 LAB — AEROBIC/ANAEROBIC CULTURE W GRAM STAIN (SURGICAL/DEEP WOUND)

## 2020-02-24 LAB — GLUCOSE, CAPILLARY
Glucose-Capillary: 128 mg/dL — ABNORMAL HIGH (ref 70–99)
Glucose-Capillary: 157 mg/dL — ABNORMAL HIGH (ref 70–99)
Glucose-Capillary: 87 mg/dL (ref 70–99)
Glucose-Capillary: 123 mg/dL — ABNORMAL HIGH (ref 70–99)

## 2020-02-24 MED ORDER — POTASSIUM PHOSPHATE MONOBASIC 500 MG PO TABS
500.0000 mg | ORAL_TABLET | Freq: Three times a day (TID) | ORAL | Status: DC
  Administered 2020-02-24 – 2020-02-26 (×7): 500 mg via ORAL
  Filled 2020-02-24 (×8): qty 1

## 2020-02-24 NOTE — Progress Notes (Signed)
PROGRESS NOTE  Brittany Archer [facility] GYK:[id number] DOB: March 22, 1941 DOA: 02/19/2020 PCP: [name], PA-C   LOS: 5 days   Brief narrative: As per HPI,  79 years old female with history of left breast cancer, CAD, type 2 diabetes mellitus,, HTN presented to the ER with worsening pain in addition to her abscessed tooth going on for 2 weeks with pain generalized weakness and also had a fall prior to admission. In the ED vitals were stable, UA WBC 11-20, CBC with WBC count 5.1,hemoglobin 12.1 g/dL and platelets 131. CMP showed a potassium of 2.5 and CO2 of 18 mmol/L. Her anion gap was 14. Glucose 140, BUN 38 and creatinine 1.45 mg/dL. Total protein is 6.7 albumin 3.2 g/dL. CT head did not show any acute intracranial abnormality. Maxillofacial CT was ordered and showed findings suspicious of osteomyelitis.  Patient was started on vancomycin and Zosyn and was admitted to the hospital. She is on chemo for multiple myeloma.   Assessment/Plan:  Active Problems:   Hypokalemia   Type 2 diabetes mellitus (HCC)   Coronary artery disease   Hypertension   Stage 3b chronic kidney disease   Hypomagnesemia   Hypophosphatemia   Facial cellulitis   Confusion   Osteomyelitis, jaw acute   Osteonecrosis (HCC)   Acute osteomyelitis of the mandible secondary to dental abscess with facial cellulitis.  CT/MRI scan shows phlegmon and osteomyelitis.   On vancomycin and Zosyn. Status post incision and drainage and debridement of mandibular osteomyelitis with removal of multiple teeth.  Dr [name] oral Psychologist, sport and exercise on board.  Drain in place, plan for removal tomorrow.  On pured diet.  Continue IV antibiotics.  ID on board and will follow recommendation.  Culture from the mandible from 2/25 with few WBC, rare gram variable rods.  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  Holding denosumab.  Multiple Myeloma on Xgeva q 28 days and Velcade weekly. Xgeva on  hold  Hypophosphatemia/Hypomagnesemia improving.  Replenish as necessary.  Phosphate of 2.3 today.  Add potassium phosphate tablet today.  Mild hypokalemia.  Replenished.  Check levels in a.m.  Type 2 diabetes mellitus Hold Metformin and glipizide.  Continue sliding scale insulin, Accu-Cheks, hemoglobin A1c of 7.6.  On sliding scale insulin at this time.  Pured diet has been started.  Coronary artery disease: On aspirin.  No acute chest pain.  Hypertension.  on lisinopril/HCTZ at home. IV antihypertensives as needed .  Continue to hold lisinopril HCTZ.  Blood cell has been stable.  Stage 3b chronic kidney disease.  Discontinue IV fluids, continue to hold lisinopril HCTZ  VTE Prophylaxis: lovenox subq  Code Status:  Full  Family Communication: None today.  Spoke with the patient's sister yesterday.    Disposition Plan:  . Patient is from home . Likely disposition to home in 2 to 3 days.  Patient has been seen by physical therapy and recommended no skilled therapy needs on discharge.   . Barriers to discharge: IV antibiotics ,oral surgery follow-up, ID input, await cultures  Consultants:  Oral surgery  Infectious disease  Procedures:  PICC line placement on 02/21/2020  Submental abscess, extraction of remaining mandibular teeth, mandibular debridment, closure of submental cutaneous fistula by oral surgery on 02/22/2020.  Antibiotics:  . Vancomycin and Zosyn 2/22>  Anti-infectives (From admission, onward)   Start     Dose/Rate Route Frequency Ordered Stop   02/21/20 0000  vancomycin (VANCOREADY) IVPB 750 mg/150 mL     750 mg 150 mL/hr over  60 Minutes Intravenous Every 24 hours 02/20/20 0742     02/20/20 1000  acyclovir (ZOVIRAX) tablet 400 mg     400 mg Oral 2 times daily 02/20/20 0630     02/20/20 0800  piperacillin-tazobactam (ZOSYN) IVPB 3.375 g     3.375 g 12.5 mL/hr over 240 Minutes Intravenous Every 8 hours 02/20/20 0422     02/19/20 2330   piperacillin-tazobactam (ZOSYN) IVPB 3.375 g     3.375 g 100 mL/hr over 30 Minutes Intravenous  Once 02/19/20 2327 02/20/20 0043   02/19/20 2330  vancomycin (VANCOCIN) IVPB 1000 mg/200 mL premix     1,000 mg 200 mL/hr over 60 Minutes Intravenous  Once 02/19/20 2327 02/20/20 0142   02/19/20 2215  cefTRIAXone (ROCEPHIN) 1 g in sodium chloride 0.9 % 100 mL IVPB     1 g 200 mL/hr over 30 Minutes Intravenous  Once 02/19/20 2205 02/19/20 2311     Subjective: Today, patient was seen and examined at bedside.  Denies overt pain.  Denies shortness of breath, cough, fever, nausea or vomiting.  Had bowel movement. Objective: Vitals:   02/24/20 0436 02/24/20 0730  BP: 111/66 133/62  Pulse: 68 65  Resp: 19 17  Temp: 98.2 F (36.8 C) 97.8 F (36.6 C)  SpO2: 99% 99%    Intake/Output Summary (Last 24 hours) at 02/24/2020 1300 Last data filed at 02/24/2020 0837 Gross per 24 hour  Intake 1429.23 ml  Output 100 ml  Net 1329.23 ml   Filed Weights   02/19/20 1828 02/20/20 0928  Weight: 70.8 kg 59.7 kg   Body mass index is 21.9 kg/m.   Physical Exam: General:  Average built, not in obvious distress HENT: Normocephalic, pupils equally reacting to light and accommodation.  No scleral pallor or icterus noted.  Right submandibular/submental area status post surgery with dressing.  Mild trismus noted. Chest: Diminished breath sounds bilateral.  No crackles or wheezes. CVS: S1 &S2 heard. No murmur.  Regular rate and rhythm. Abdomen: Soft, nontender, nondistended.  Bowel sounds are heard.  Extremities: No cyanosis, clubbing or edema.  Peripheral pulses are palpable.  Right upper extremity PICC line in place Psych: Alert, awake and oriented, normal mood CNS:  No cranial nerve deficits.  Power equal in all extremities.  No sensory deficits noted.  No cerebellar signs.   Skin: Warm and dry.  Right submandibular/submental area with dressing.  Data Review: I have personally reviewed the following  laboratory data and studies,  CBC: Recent Labs  Lab 02/19/20 1930 02/20/20 0347 02/22/20 0322 02/23/20 0449 02/24/20 0410  WBC 5.1 4.8 3.3* 6.4 4.1  NEUTROABS 4.0 3.4  --   --   --   HGB 12.1 10.2* 9.4* 10.0* 8.9*  HCT 37.4 32.3* 30.2* 31.4* 29.0*  MCV 91.4 93.6 94.1 93.2 96.0  PLT 131* 123* 123* 144* 267*   Basic Metabolic Panel: Recent Labs  Lab 02/19/20 1930 02/20/20 0347 02/20/20 1342 02/21/20 0749 02/22/20 0322 02/23/20 0449 02/24/20 0410  NA 138   < > 137 139 139 138 138  K 2.5*   < > 3.9 2.8* 3.3* 4.1 3.4*  CL 106   < > 111 110 108 103 103  CO2 18*   < > 17* 18* 21* 23 23  GLUCOSE 140*   < > 144* 126* 92 263* 151*  BUN 38*   < > 23 13 5* <5* 5*  CREATININE 1.45*   < > 1.07* 0.95 0.79 0.76 0.95  CALCIUM 8.2*   < >  7.5* 7.2* 7.1* 6.6* 7.0*  MG 1.3*  --   --  1.3* 1.4* 1.4* 1.8  PHOS 1.3*  --  1.0* 2.4* 2.1* 2.1* 2.3*   < > = values in this interval not displayed.   Liver Function Tests: Recent Labs  Lab 02/19/20 1930 02/20/20 0347 02/21/20 0749 02/22/20 0322  AST 14* 13* 10* 11*  ALT '10 8 7 7  ' ALKPHOS 75 65 59 56  BILITOT 0.9 0.8 1.0 1.2  PROT 6.7 6.0* 5.6* 5.9*  ALBUMIN 3.2* 2.8* 2.5* 2.6*   No results for input(s): LIPASE, AMYLASE in the last 168 hours. No results for input(s): AMMONIA in the last 168 hours. Cardiac Enzymes: No results for input(s): CKTOTAL, CKMB, CKMBINDEX, TROPONINI in the last 168 hours. BNP (last 3 results) No results for input(s): BNP in the last 8760 hours.  ProBNP (last 3 results) No results for input(s): PROBNP in the last 8760 hours.  CBG: Recent Labs  Lab 02/23/20 1135 02/23/20 1612 02/23/20 2152 02/24/20 0802 02/24/20 1142  GLUCAP 146* 76 158* 128* 157*   Recent Results (from the past 240 hour(s))  Urine Culture     Status: Abnormal   Collection Time: 02/19/20  9:40 PM   Specimen: Urine, Clean Catch  Result Value Ref Range Status   Specimen Description   Final    URINE, CLEAN CATCH Performed at [facility], [address], [place], [place] [address]    Special Requests   Final    NONE Performed at [facility], [address] [place], [place] [address]    Culture MULTIPLE SPECIES PRESENT, SUGGEST RECOLLECTION (A)  Final   Report Status 02/21/2020 FINAL  Final  SARS CORONAVIRUS 2 (TAT 6-24 HRS) Nasopharyngeal Nasopharyngeal Swab     Status: None   Collection Time: 02/19/20 10:21 PM   Specimen: Nasopharyngeal Swab  Result Value Ref Range Status   SARS Coronavirus 2 NEGATIVE NEGATIVE Final    Comment: (NOTE) SARS-CoV-2 target nucleic acids are NOT DETECTED. The SARS-CoV-2 RNA is generally detectable in upper and lower respiratory specimens during the acute phase of infection. Negative results do not preclude SARS-CoV-2 infection, do not rule out co-infections with other pathogens, and should not be used as the sole basis for treatment or other patient management decisions. Negative results must be combined with clinical observations, patient history, and epidemiological information. The expected result is Negative. Fact Sheet for Patients: SugarRoll.be Fact Sheet for Healthcare Providers: https://www.woods-mathews.com/ This test is not yet approved or cleared by the [place] FDA and  has been authorized for detection and/or diagnosis of SARS-CoV-2 by FDA under an Emergency Use Authorization (EUA). This EUA will remain  in effect (meaning this test can be used) for the duration of the COVID-19 declaration under Section 56 4(b)(1) of the Act, 21 U.S.C. section 360bbb-3(b)(1), unless the authorization is terminated or revoked sooner. Performed at [facility], [place] [address] [place], [place] [address]   Aerobic/Anaerobic Culture (surgical/deep wound)     Status: None (Preliminary result)   Collection Time: 02/19/20 11:24 PM   Specimen: Face  Result Value Ref Range Status   Specimen Description FACE  Final   Special Requests  Immunocompromised  Final   Gram Stain   Final    FEW WBC PRESENT, PREDOMINANTLY PMN MODERATE GRAM POSITIVE COCCI IN CHAINS IN CLUSTERS RARE GRAM POSITIVE RODS    Culture   Final    RARE STAPHYLOCOCCUS LUGDUNENSIS SUSCEPTIBILITIES TO FOLLOW HOLDING FOR POSSIBLE ANAEROBE  Report Status PENDING  Incomplete   Organism ID, Bacteria STAPHYLOCOCCUS LUGDUNENSIS  Final      Susceptibility   Staphylococcus lugdunensis - MIC*    CIPROFLOXACIN <=0.5 SENSITIVE Sensitive     ERYTHROMYCIN <=0.25 SENSITIVE Sensitive     GENTAMICIN <=0.5 SENSITIVE Sensitive     OXACILLIN SENSITIVE Sensitive     TETRACYCLINE <=1 SENSITIVE Sensitive     VANCOMYCIN <=0.5 SENSITIVE Sensitive     TRIMETH/SULFA <=10 SENSITIVE Sensitive     CLINDAMYCIN <=0.25 SENSITIVE Sensitive     RIFAMPIN <=0.5 SENSITIVE Sensitive     Inducible Clindamycin Value in next row Sensitive      NEGATIVEPerformed at [facility] [address], [place], [place] [address]    * RARE STAPHYLOCOCCUS LUGDUNENSIS  C difficile quick scan w PCR reflex     Status: None   Collection Time: 02/22/20 12:30 AM   Specimen: STOOL  Result Value Ref Range Status   C Diff antigen NEGATIVE NEGATIVE Final   C Diff toxin NEGATIVE NEGATIVE Final   C Diff interpretation No C. difficile detected.  Final    Comment: Performed at [facility], [place] [address], [place], [place] [address]  Surgical pcr screen     Status: None   Collection Time: 02/22/20  6:12 AM   Specimen: Nasal Mucosa; Nasal Swab  Result Value Ref Range Status   MRSA, PCR NEGATIVE NEGATIVE Final   Staphylococcus aureus NEGATIVE NEGATIVE Final    Comment: (NOTE) The Xpert SA Assay (FDA approved for NASAL specimens in patients 79 years of age and older), is one component of a comprehensive surveillance program. It is not intended to diagnose infection nor to guide or monitor treatment. Performed at [facility], [place] [address] [place],  [place] [address]   Aerobic/Anaerobic Culture (surgical/deep wound)     Status: None (Preliminary result)   Collection Time: 02/22/20  5:30 PM   Specimen: PATH Other; Tissue  Result Value Ref Range Status   Specimen Description   Final    ORAL EXTRA ORAL FISTULA Performed at [place] [address], [place], [place] [address]    Special Requests   Final    NONE Performed at [facility], [place] [address] [place], [place] [address]    Gram Stain   Final    ABUNDANT WBC PRESENT, PREDOMINANTLY PMN NO ORGANISMS SEEN Performed at [facility], [place] [address] [place], [place] [address]    Culture   Final    RARE NORMAL OROPHARYNGEAL FLORA NO ANAEROBES ISOLATED; CULTURE IN PROGRESS FOR 5 DAYS    Report Status PENDING  Incomplete  Aerobic/Anaerobic Culture (surgical/deep wound)     Status: None (Preliminary result)   Collection Time: 02/22/20  5:31 PM   Specimen: PATH Other; Tissue  Result Value Ref Range Status   Specimen Description   Final    MANDIBLE INTRAORAL RIGHT MANDIBULAR Performed at [place] [address] [place], [place] [address]    Special Requests   Final    NONE Performed at [facility], [place] [address], [place], [place] [address]    Gram Stain   Final    FEW WBC PRESENT, PREDOMINANTLY PMN RARE GRAM VARIABLE ROD Performed at [facility], [place] [address] [place], [place] [address]    Culture   Final    RARE NORMAL SKIN FLORA NO ANAEROBES ISOLATED; CULTURE IN PROGRESS FOR 5 DAYS  Report Status PENDING  Incomplete     Studies: No results found.    Flora Lipps, MD  [facility] 02/24/2020

## 2020-02-24 NOTE — Progress Notes (Signed)
Subjective: POD2 s/p I&D submental abscess, extraction of remaining mandibular teeth, mandibular debridment, closure of submental cutaneous fistula. Pt states that she is comfortable, and feels ok. Minimal soreness.  Objective: Vital signs in last 24 hours: Temp:  [97.8 F (36.6 C)-98.2 F (36.8 C)] 97.8 F (36.6 C) (02/27 0730) Pulse Rate:  [61-68] 65 (02/27 0730) Resp:  [16-19] 17 (02/27 0730) BP: (111-164)/(62-66) 133/62 (02/27 0730) SpO2:  [99 %-100 %] 99 % (02/27 0730) Wt Readings from Last 1 Encounters:  02/20/20 59.7 kg    Intake/Output from previous day: 02/26 0701 - 02/27 0700 In: 1901.2 [P.O.:472; I.V.:464.7; IV Piggyback:964.6] Out: -  Intake/Output this shift: Total I/O In: -  Out: 100 [Urine:100]  PE: Gen: awake, alert, nad HEENT: mild submental and right facial edema - soft, submental dressing in place - drainage appears hemorrhagic in nature. Intraorally, wounds intact and appear hemostatic. Mild FOM edema. Oropharynx clear.  Recent Labs    02/23/20 0449 02/24/20 0410  WBC 6.4 4.1  HGB 10.0* 8.9*  HCT 31.4* 29.0*  PLT 144* 136*    Recent Labs    02/23/20 0449 02/24/20 0410  NA 138 138  K 4.1 3.4*  CL 103 103  CO2 23 23  GLUCOSE 263* 151*  BUN <5* 5*  CREATININE 0.76 0.95  CALCIUM 6.6* 7.0*    Medications: I have reviewed the patient's current medications.  Assessment/Plan: POD2 s/p I&D submental abscess, extraction of remaining mandibular teeth, mandibular debridment, closure of submental cutaneous fistula. Progressing well. Cont IV Vanc/Zosyn; cont holding Xgeva inj for now. Pureed diet. Chlorhexidine rinses tid until further notice. Possibly remove drains tomorrow, change submental dressing q8h.   *Please call Dr. [name] at [phone] with questions/concerns.   LOS: 5 days   Michael Litter, DMD Oral & Maxillofacial Surgery 02/24/2020, 10:44 AM

## 2020-02-24 NOTE — Evaluation (Signed)
Physical Therapy Evaluation Patient Details Name: Brittany Archer MRN: [id number] DOB: September 04, 1941 Today's Date: 02/24/2020   History of Present Illness  79 years old female with history of left breast cancer, CAD, type 2 diabetes mellitus, multiple myeloma,  HTN presented to the ER with worsening pain in addition to her abscessed tooth going on for 2 weeks with pain generalized weakness and also had a fall prior to admission. Dx of osteomyelitis of jaw, s/p I&D 02/22/20.  Clinical Impression  Pt admitted with above diagnosis. Pt ambulated 140' with RW, no loss of balance, distance limited by fatigue. Good progress expected.  Pt currently with functional limitations due to the deficits listed below (see PT Problem List). Pt will benefit from skilled PT to increase their independence and safety with mobility to allow discharge to the venue listed below.       Follow Up Recommendations No PT follow up    Equipment Recommendations  Rolling walker with 5" wheels;3in1 (PT)    Recommendations for Other Services       Precautions / Restrictions Precautions Precautions: None Precaution Comments: denies falls in past 6 months Restrictions Weight Bearing Restrictions: No      Mobility  Bed Mobility Overal bed mobility: Modified Independent             General bed mobility comments: HOB up, used rail  Transfers Overall transfer level: Needs assistance Equipment used: Rolling walker (2 wheeled) Transfers: Sit to/from Stand Sit to Stand: Supervision         General transfer comment: VCs hand placement  Ambulation/Gait Ambulation/Gait assistance: Supervision Gait Distance (Feet): 140 Feet Assistive device: Rolling walker (2 wheeled) Gait Pattern/deviations: Step-through pattern;Decreased stride length Gait velocity: decr   General Gait Details: VCs positioning with RW, no loss of balance, distance limited by fatigue  Stairs            Wheelchair Mobility     Modified Rankin (Stroke Patients Only)       Balance Overall balance assessment: Modified Independent                                           Pertinent Vitals/Pain Pain Assessment: No/denies pain    Home Living Family/patient expects to be discharged to:: Private residence Living Arrangements: Other relatives Available Help at Discharge: Family;Available 24 hours/day   Home Access: Stairs to enter Entrance Stairs-Rails: None Entrance Stairs-Number of Steps: 2 Home Layout: One level Home Equipment: Shower seat      Prior Function Level of Independence: Independent         Comments: ambulates without AD, no falls in 6 months, doesn't drive, lives with brother, sister assists with transportation, can make meals independently     Hand Dominance        Extremity/Trunk Assessment   Upper Extremity Assessment Upper Extremity Assessment: Overall WFL for tasks assessed    Lower Extremity Assessment Lower Extremity Assessment: Overall WFL for tasks assessed    Cervical / Trunk Assessment Cervical / Trunk Assessment: Normal  Communication   Communication: No difficulties  Cognition Arousal/Alertness: Awake/alert Behavior During Therapy: WFL for tasks assessed/performed Overall Cognitive Status: Within Functional Limits for tasks assessed  General Comments      Exercises     Assessment/Plan    PT Assessment Patient needs continued PT services  PT Problem List Decreased activity tolerance;Decreased mobility       PT Treatment Interventions Gait training;Functional mobility training;Therapeutic exercise;Therapeutic activities    PT Goals (Current goals can be found in the Care Plan section)  Acute Rehab PT Goals Patient Stated Goal: return home soon PT Goal Formulation: With patient Time For Goal Achievement: 03/09/20 Potential to Achieve Goals: Good    Frequency Min  3X/week   Barriers to discharge        Co-evaluation               AM-PAC PT "6 Clicks" Mobility  Outcome Measure Help needed turning from your back to your side while in a flat bed without using bedrails?: A Little Help needed moving from lying on your back to sitting on the side of a flat bed without using bedrails?: A Little Help needed moving to and from a bed to a chair (including a wheelchair)?: A Little Help needed standing up from a chair using your arms (e.g., wheelchair or bedside chair)?: None Help needed to walk in hospital room?: None Help needed climbing 3-5 steps with a railing? : A Little 6 Click Score: 20    End of Session Equipment Utilized During Treatment: Gait belt Activity Tolerance: Patient tolerated treatment well Patient left: in chair;with call bell/phone within reach Nurse Communication: Mobility status PT Visit Diagnosis: Difficulty in walking, not elsewhere classified (R26.2)    Time: 2984-7308 PT Time Calculation (min) (ACUTE ONLY): 29 min   Charges:   PT Evaluation $PT Eval Low Complexity: 1 Low PT Treatments $Gait Training: 8-22 mins       [name] PT 02/24/2020  Acute Rehabilitation Services Pager [phone] Office [phone]

## 2020-02-25 DIAGNOSIS — L03211 Cellulitis of face: Secondary | ICD-10-CM

## 2020-02-25 LAB — BASIC METABOLIC PANEL
Anion gap: 10 (ref 5–15)
BUN: 7 mg/dL — ABNORMAL LOW (ref 8–23)
CO2: 23 mmol/L (ref 22–32)
Calcium: 8 mg/dL — ABNORMAL LOW (ref 8.9–10.3)
Chloride: 106 mmol/L (ref 98–111)
Creatinine, Ser: 0.92 mg/dL (ref 0.44–1.00)
GFR calc Af Amer: >60 mL/min (ref >60)
GFR calc non Af Amer: 59 mL/min — ABNORMAL LOW (ref >60)
Glucose, Bld: 86 mg/dL (ref 70–99)
Potassium: 3.6 mmol/L (ref 3.5–5.1)
Sodium: 139 mmol/L (ref 135–145)

## 2020-02-25 LAB — CBC
HCT: 27.4 % — ABNORMAL LOW (ref 36.0–46.0)
Hemoglobin: 8.5 g/dL — ABNORMAL LOW (ref 12.0–15.0)
MCH: 30.1 pg (ref 26.0–34.0)
MCHC: 31 g/dL (ref 30.0–36.0)
MCV: 97.2 fL (ref 80.0–100.0)
Platelets: 152 10*3/uL (ref 150–400)
RBC: 2.82 MIL/uL — ABNORMAL LOW (ref 3.87–5.11)
RDW: 18.8 % — ABNORMAL HIGH (ref 11.5–15.5)
WBC: 3.9 10*3/uL — ABNORMAL LOW (ref 4.0–10.5)
nRBC: 0 % (ref 0.0–0.2)

## 2020-02-25 LAB — MAGNESIUM: Magnesium: 1.6 mg/dL — ABNORMAL LOW (ref 1.7–2.4)

## 2020-02-25 LAB — GLUCOSE, CAPILLARY
Glucose-Capillary: 93 mg/dL (ref 70–99)
Glucose-Capillary: 111 mg/dL — ABNORMAL HIGH (ref 70–99)
Glucose-Capillary: 101 mg/dL — ABNORMAL HIGH (ref 70–99)
Glucose-Capillary: 139 mg/dL — ABNORMAL HIGH (ref 70–99)

## 2020-02-25 NOTE — Progress Notes (Signed)
Subjective: POD3 s/p I&D submental abscess, extraction of remaining mandibular teeth, mandibular debridment, closure of submental cutaneous fistula. Pt states that she is comfortable, and feels ok. Minimal soreness. Tolerating pureed foods.  Objective: Vital signs in last 24 hours: Temp:  [98.4 F (36.9 C)-99.5 F (37.5 C)] 98.4 F (36.9 C) (02/28 0611) Pulse Rate:  [66-86] 79 (02/28 0611) Resp:  [18-20] 20 (02/28 0611) BP: (101-137)/(51-65) 137/65 (02/28 0611) SpO2:  [98 %-99 %] 98 % (02/28 0611) Wt Readings from Last 1 Encounters:  02/20/20 59.7 kg    Intake/Output from previous day: 02/27 0701 - 02/28 0700 In: -  Out: 100 [Urine:100] Intake/Output this shift: No intake/output data recorded.  PE: Gen: awake, alert, nad HEENT: mild submental facial edema - soft, submental dressing in place - drainage appears hemorrhagic in nature. Intraorally, wounds intact and appear hemostatic. Mild FOM edema. Oropharynx clear.  Recent Labs    02/24/20 0410 02/25/20 0338  WBC 4.1 3.9*  HGB 8.9* 8.5*  HCT 29.0* 27.4*  PLT 136* 152    Recent Labs    02/24/20 0410 02/25/20 0338  NA 138 139  K 3.4* 3.6  CL 103 106  CO2 23 23  GLUCOSE 151* 86  BUN 5* 7*  CREATININE 0.95 0.92  CALCIUM 7.0* 8.0*    Medications: I have reviewed the patient's current medications.  Assessment/Plan: POD3 s/p I&D submental abscess, extraction of remaining mandibular teeth, mandibular debridment, closure of submental cutaneous fistula. Progressing well. Cont IV Vanc/Zosyn; cont holding Xgeva inj for now. Pureed diet. Chlorhexidine rinses tid until further notice. Will remove drains tomorrow, change submental dressing q8h.   *Please call Dr. [name] at [phone] with questions/concerns.   LOS: 6 days   Michael Litter, DMD Oral & Maxillofacial Surgery 02/25/2020, 9:54 AM

## 2020-02-25 NOTE — Progress Notes (Signed)
PROGRESS NOTE  Brittany Archer [facility] DJS:[id number] DOB: [birth year] DOA: 02/19/2020 PCP: [name], PA-C   LOS: 6 days   Brief narrative: As per HPI,  79 years old female with history of left breast cancer, CAD, type 2 diabetes mellitus,, HTN presented to the ER with worsening pain in addition to her abscessed tooth going on for 2 weeks with pain generalized weakness and also had a fall prior to admission. In the ED vitals were stable, UA WBC 11-20, CBC with WBC count 5.1,hemoglobin 12.1 g/dL and platelets 131. CMP showed a potassium of 2.5 and CO2 of 18 mmol/L. Her anion gap was 14. Glucose 140, BUN 38 and creatinine 1.45 mg/dL. Total protein is 6.7 albumin 3.2 g/dL. CT head did not show any acute intracranial abnormality. Maxillofacial CT was ordered and showed findings suspicious of osteomyelitis.  Patient was started on vancomycin and Zosyn and was admitted to the hospital. She is on chemo for multiple myeloma.   Assessment/Plan:  Active Problems:   Hypokalemia   Type 2 diabetes mellitus (HCC)   Coronary artery disease   Hypertension   Stage 3b chronic kidney disease   Hypomagnesemia   Hypophosphatemia   Facial cellulitis   Confusion   Osteomyelitis, jaw acute   Osteonecrosis (HCC)   Acute osteomyelitis of the mandible, dental abscess with facial cellulitis.  CT/MRI scan shows phlegmon and osteomyelitis.   received vancomycin and Zosyn. Vanco has been discontinued by ID.  Status post incision and drainage and debridement of mandibular osteomyelitis with removal of multiple teeth.  Dr [name] oral Psychologist, sport and exercise on board.  On pured diet.  Culture from the mandible from 2/25 with few WBC.  ID recommending continuation of Zosyn while in the hospital and possible long-term antibiotic with Rocephin and oral metronidazole versus ertapenem on discharge.  ID to follow.  Oral surgery to follow the patient as well.  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   Holding denosumab.  Multiple Myeloma on Xgeva q 28 days and Velcade weekly. Xgeva on hold  Hypophosphatemia/Hypomagnesemia  Replenish as necessary.  On potassium phosphate.  Magnesium of 1.6 today.  Will replenish.  Mild hypokalemia.  Improved.  Potassium 3.6.  Continue potassium phosphate.  Type 2 diabetes mellitus Hold Metformin and glipizide.  Continue sliding scale insulin, Accu-Cheks, hemoglobin A1c of 7.6.   Tolerating pured diet.  Last POC glucose was 111  Coronary artery disease: On aspirin.  No acute chest pain.  Hypertension.  on lisinopril/HCTZ at home. IV antihypertensives as needed .  Continue to hold lisinopril HCTZ.  Blood pressure has been stable at this time.  Stage 3b chronic kidney disease.  Off IV fluids, continue to hold lisinopril HCTZ Check BMP in a.m.  VTE Prophylaxis: lovenox subq  Code Status:  Full  Family Communication: None today.      Disposition Plan:  . Patient is from home . Likely disposition to home in 2 to 3 days.  Patient has been seen by physical therapy and recommended no skilled therapy needs on discharge.   . Barriers to discharge: IV antibiotics ,oral surgery/ID recommendations   Consultants:  Oral surgery  Infectious disease  Procedures:  PICC line placement on 02/21/2020  Submental abscess, extraction of remaining mandibular teeth, mandibular debridment, closure of submental cutaneous fistula by oral surgery on 02/22/2020.  Antibiotics:  . Vancomycin 2/22>2/28 . Zosyn 2/22>  Anti-infectives (From admission, onward)   Start     Dose/Rate Route Frequency Ordered Stop   02/21/20 0000  vancomycin (VANCOREADY) IVPB 750 mg/150 mL     750 mg 150 mL/hr over 60 Minutes Intravenous Every 24 hours 02/20/20 0742     02/20/20 1000  acyclovir (ZOVIRAX) tablet 400 mg     400 mg Oral 2 times daily 02/20/20 0630     02/20/20 0800  piperacillin-tazobactam (ZOSYN) IVPB 3.375 g     3.375 g 12.5 mL/hr over 240 Minutes Intravenous Every 8  hours 02/20/20 0422     02/19/20 2330  piperacillin-tazobactam (ZOSYN) IVPB 3.375 g     3.375 g 100 mL/hr over 30 Minutes Intravenous  Once 02/19/20 2327 02/20/20 0043   02/19/20 2330  vancomycin (VANCOCIN) IVPB 1000 mg/200 mL premix     1,000 mg 200 mL/hr over 60 Minutes Intravenous  Once 02/19/20 2327 02/20/20 0142   02/19/20 2215  cefTRIAXone (ROCEPHIN) 1 g in sodium chloride 0.9 % 100 mL IVPB     1 g 200 mL/hr over 30 Minutes Intravenous  Once 02/19/20 2205 02/19/20 2311     Subjective: Patient was seen and examined at bedside.  Denies overt pain in the jaw.  Denies fever chills or rigor.  No shortness of breath cough fever.  Objective: Vitals:   02/24/20 1959 02/25/20 0611  BP: (!) 101/51 137/65  Pulse: 86 79  Resp: 19 20  Temp: 99.5 F (37.5 C) 98.4 F (36.9 C)  SpO2: 99% 98%   No intake or output data in the 24 hours ending 02/25/20 1336 Filed Weights   02/19/20 1828 02/20/20 0928  Weight: 70.8 kg 59.7 kg   Body mass index is 21.9 kg/m.   Physical Exam: General:  Average built, not in obvious distress HENT: Normocephalic, pupils equally reacting to light and accommodation. Right submental/submandibular dressing.  Mild trismus noted   Chest:  Clear breath sounds.  Diminished breath sounds bilaterally. No crackles or wheezes.  CVS: S1 &S2 heard. No murmur.  Regular rate and rhythm. Abdomen: Soft, nontender, nondistended.  Bowel sounds are heard.  Liver is not palpable, no abdominal mass palpated Extremities: No cyanosis, clubbing or edema.  Peripheral pulses are palpable.  Right upper extremity PICC line in place Psych: Alert, awake and oriented, normal mood CNS:  No cranial nerve deficits.  Power equal in all extremities.  No sensory deficits noted.  No cerebellar signs.   Skin: Warm and dry.  Submandibular/submental area with dressing.  Data Review: I have personally reviewed the following laboratory data and studies,  CBC: Recent Labs  Lab 02/19/20 1930  02/19/20 1930 02/20/20 0347 02/22/20 0322 02/23/20 0449 02/24/20 0410 02/25/20 0338  WBC 5.1   < > 4.8 3.3* 6.4 4.1 3.9*  NEUTROABS 4.0  --  3.4  --   --   --   --   HGB 12.1   < > 10.2* 9.4* 10.0* 8.9* 8.5*  HCT 37.4   < > 32.3* 30.2* 31.4* 29.0* 27.4*  MCV 91.4   < > 93.6 94.1 93.2 96.0 97.2  PLT 131*   < > 123* 123* 144* 136* 152   < > = values in this interval not displayed.   Basic Metabolic Panel: Recent Labs  Lab 02/20/20 1342 02/20/20 1342 02/21/20 0749 02/22/20 0322 02/23/20 0449 02/24/20 0410 02/25/20 0338  NA 137   < > 139 139 138 138 139  K 3.9   < > 2.8* 3.3* 4.1 3.4* 3.6  CL 111   < > 110 108 103 103 106  CO2 17*   < > 18* 21*  '23 23 23  ' GLUCOSE 144*   < > 126* 92 263* 151* 86  BUN 23   < > 13 5* <5* 5* 7*  CREATININE 1.07*   < > 0.95 0.79 0.76 0.95 0.92  CALCIUM 7.5*   < > 7.2* 7.1* 6.6* 7.0* 8.0*  MG  --   --  1.3* 1.4* 1.4* 1.8 1.6*  PHOS 1.0*  --  2.4* 2.1* 2.1* 2.3*  --    < > = values in this interval not displayed.   Liver Function Tests: Recent Labs  Lab 02/19/20 1930 02/20/20 0347 02/21/20 0749 02/22/20 0322  AST 14* 13* 10* 11*  ALT '10 8 7 7  ' ALKPHOS 75 65 59 56  BILITOT 0.9 0.8 1.0 1.2  PROT 6.7 6.0* 5.6* 5.9*  ALBUMIN 3.2* 2.8* 2.5* 2.6*   No results for input(s): LIPASE, AMYLASE in the last 168 hours. No results for input(s): AMMONIA in the last 168 hours. Cardiac Enzymes: No results for input(s): CKTOTAL, CKMB, CKMBINDEX, TROPONINI in the last 168 hours. BNP (last 3 results) No results for input(s): BNP in the last 8760 hours.  ProBNP (last 3 results) No results for input(s): PROBNP in the last 8760 hours.  CBG: Recent Labs  Lab 02/24/20 1142 02/24/20 1653 02/24/20 2051 02/25/20 0729 02/25/20 1156  GLUCAP 157* 87 123* 93 111*   Recent Results (from the past 240 hour(s))  Urine Culture     Status: Abnormal   Collection Time: 02/19/20  9:40 PM   Specimen: Urine, Clean Catch  Result Value Ref Range Status   Specimen  Description   Final    URINE, CLEAN CATCH Performed at [facility], [address], [place], [place] [address]    Special Requests   Final    NONE Performed at [facility], [address], [place], [place] [address]    Culture MULTIPLE SPECIES PRESENT, SUGGEST RECOLLECTION (A)  Final   Report Status 02/21/2020 FINAL  Final  SARS CORONAVIRUS 2 (TAT 6-24 HRS) Nasopharyngeal Nasopharyngeal Swab     Status: None   Collection Time: 02/19/20 10:21 PM   Specimen: Nasopharyngeal Swab  Result Value Ref Range Status   SARS Coronavirus 2 NEGATIVE NEGATIVE Final    Comment: (NOTE) SARS-CoV-2 target nucleic acids are NOT DETECTED. The SARS-CoV-2 RNA is generally detectable in upper and lower respiratory specimens during the acute phase of infection. Negative results do not preclude SARS-CoV-2 infection, do not rule out co-infections with other pathogens, and should not be used as the sole basis for treatment or other patient management decisions. Negative results must be combined with clinical observations, patient history, and epidemiological information. The expected result is Negative. Fact Sheet for Patients: SugarRoll.be Fact Sheet for Healthcare Providers: https://www.woods-mathews.com/ This test is not yet approved or cleared by the [place] FDA and  has been authorized for detection and/or diagnosis of SARS-CoV-2 by FDA under an Emergency Use Authorization (EUA). This EUA will remain  in effect (meaning this test can be used) for the duration of the COVID-19 declaration under Section 56 4(b)(1) of the Act, 21 U.S.C. section 360bbb-3(b)(1), unless the authorization is terminated or revoked sooner. Performed at [facility], [place] [address], [place], [place] [address]   Aerobic/Anaerobic Culture (surgical/deep wound)     Status: None   Collection Time: 02/19/20 11:24 PM   Specimen: Face  Result Value Ref Range Status   Specimen  Description   Final    FACE Performed at [facility], [address], [place], [place] [address]    Special Requests   Final    Immunocompromised Performed at [facility], [address], [place], [place] [address]    Gram Stain   Final    FEW WBC PRESENT, PREDOMINANTLY PMN MODERATE GRAM POSITIVE COCCI IN CHAINS IN CLUSTERS RARE GRAM POSITIVE RODS    Culture   Final    RARE STAPHYLOCOCCUS LUGDUNENSIS NO ANAEROBES ISOLATED Performed at [facility], [place] [address], [place], [place] [address]    Report Status 02/24/2020 FINAL  Final   Organism ID, Bacteria STAPHYLOCOCCUS LUGDUNENSIS  Final      Susceptibility   Staphylococcus lugdunensis - MIC*    CIPROFLOXACIN <=0.5 SENSITIVE Sensitive     ERYTHROMYCIN <=0.25 SENSITIVE Sensitive     GENTAMICIN <=0.5 SENSITIVE Sensitive     OXACILLIN SENSITIVE Sensitive     TETRACYCLINE <=1 SENSITIVE Sensitive     VANCOMYCIN <=0.5 SENSITIVE Sensitive     TRIMETH/SULFA <=10 SENSITIVE Sensitive     CLINDAMYCIN <=0.25 SENSITIVE Sensitive     RIFAMPIN <=0.5 SENSITIVE Sensitive     Inducible Clindamycin NEGATIVE Sensitive     * RARE STAPHYLOCOCCUS LUGDUNENSIS  C difficile quick scan w PCR reflex     Status: None   Collection Time: 02/22/20 12:30 AM   Specimen: STOOL  Result Value Ref Range Status   C Diff antigen NEGATIVE NEGATIVE Final   C Diff toxin NEGATIVE NEGATIVE Final   C Diff interpretation No C. difficile detected.  Final    Comment: Performed at [facility], [place] [address] [place], [place] [address]  Surgical pcr screen     Status: None   Collection Time: 02/22/20  6:12 AM   Specimen: Nasal Mucosa; Nasal Swab  Result Value Ref Range Status   MRSA, PCR NEGATIVE NEGATIVE Final   Staphylococcus aureus NEGATIVE NEGATIVE Final    Comment: (NOTE) The Xpert SA Assay (FDA approved for NASAL specimens in patients 37 years of age and older), is one component of a comprehensive surveillance program. It is not  intended to diagnose infection nor to guide or monitor treatment. Performed at [facility], [place] [address] [place], [place] [address]   Aerobic/Anaerobic Culture (surgical/deep wound)     Status: None (Preliminary result)   Collection Time: 02/22/20  5:30 PM   Specimen: PATH Other; Tissue  Result Value Ref Range Status   Specimen Description   Final    ORAL EXTRA ORAL FISTULA Performed at [place] [address], [place], [place] [address]    Special Requests   Final    NONE Performed at [facility], [place] [address] [place], [place] [address]    Gram Stain   Final    ABUNDANT WBC PRESENT, PREDOMINANTLY PMN NO ORGANISMS SEEN Performed at [facility], [place] [address] [place], [place] [address]    Culture   Final    RARE NORMAL OROPHARYNGEAL FLORA NO ANAEROBES ISOLATED; CULTURE IN PROGRESS FOR 5 DAYS    Report Status PENDING  Incomplete  Aerobic/Anaerobic Culture (surgical/deep wound)     Status: None (Preliminary result)   Collection Time: 02/22/20  5:31 PM   Specimen: PATH Other; Tissue  Result Value Ref Range Status   Specimen Description   Final    MANDIBLE INTRAORAL RIGHT MANDIBULAR Performed at [place] [address] [place], [place] [address]    Special Requests   Final    NONE Performed  at [facility], [place] [address], [place], [place] [address]    Gram Stain   Final    FEW WBC PRESENT, PREDOMINANTLY PMN RARE GRAM VARIABLE ROD Performed at [facility], [place] [address] [place], [place] [address]    Culture   Final    RARE NORMAL SKIN FLORA NO ANAEROBES ISOLATED; CULTURE IN PROGRESS FOR 5 DAYS    Report Status PENDING  Incomplete     Studies: No results found.    Flora Lipps, MD  [facility] 02/25/2020

## 2020-02-25 NOTE — Progress Notes (Signed)
No positive cultures to date other than the swab with Staph of unclear significance.  Continue with pip/tazo No MRSA growth, nasal swab negative for MRSA so I will stop vancomycin If no growth, will plan on long term IV ceftriaxone + oral metronidazole vs IV ertapenem I will follow up tomorrow Thayer Headings, MD

## 2020-02-26 LAB — COMPREHENSIVE METABOLIC PANEL
ALT: 11 U/L (ref 0–44)
AST: 16 U/L (ref 15–41)
Albumin: 2.6 g/dL — ABNORMAL LOW (ref 3.5–5.0)
Alkaline Phosphatase: 63 U/L (ref 38–126)
Anion gap: 11 (ref 5–15)
BUN: 9 mg/dL (ref 8–23)
CO2: 23 mmol/L (ref 22–32)
Calcium: 8.7 mg/dL — ABNORMAL LOW (ref 8.9–10.3)
Chloride: 107 mmol/L (ref 98–111)
Creatinine, Ser: 0.93 mg/dL (ref 0.44–1.00)
GFR calc Af Amer: >60 mL/min (ref >60)
GFR calc non Af Amer: 58 mL/min — ABNORMAL LOW (ref >60)
Glucose, Bld: 98 mg/dL (ref 70–99)
Potassium: 3.5 mmol/L (ref 3.5–5.1)
Sodium: 141 mmol/L (ref 135–145)
Total Bilirubin: 0.8 mg/dL (ref 0.3–1.2)
Total Protein: 5.8 g/dL — ABNORMAL LOW (ref 6.5–8.1)

## 2020-02-26 LAB — CBC
HCT: 26.7 % — ABNORMAL LOW (ref 36.0–46.0)
Hemoglobin: 8.3 g/dL — ABNORMAL LOW (ref 12.0–15.0)
MCH: 30.2 pg (ref 26.0–34.0)
MCHC: 31.1 g/dL (ref 30.0–36.0)
MCV: 97.1 fL (ref 80.0–100.0)
Platelets: 154 10*3/uL (ref 150–400)
RBC: 2.75 MIL/uL — ABNORMAL LOW (ref 3.87–5.11)
RDW: 18.6 % — ABNORMAL HIGH (ref 11.5–15.5)
WBC: 2.9 10*3/uL — ABNORMAL LOW (ref 4.0–10.5)
nRBC: 0 % (ref 0.0–0.2)

## 2020-02-26 LAB — MAGNESIUM: Magnesium: 1.5 mg/dL — ABNORMAL LOW (ref 1.7–2.4)

## 2020-02-26 LAB — PHOSPHORUS: Phosphorus: 4.3 mg/dL (ref 2.5–4.6)

## 2020-02-26 LAB — GLUCOSE, CAPILLARY
Glucose-Capillary: 109 mg/dL — ABNORMAL HIGH (ref 70–99)
Glucose-Capillary: 166 mg/dL — ABNORMAL HIGH (ref 70–99)
Glucose-Capillary: 79 mg/dL (ref 70–99)
Glucose-Capillary: 178 mg/dL — ABNORMAL HIGH (ref 70–99)

## 2020-02-26 MED ORDER — METRONIDAZOLE 500 MG PO TABS
500.0000 mg | ORAL_TABLET | Freq: Three times a day (TID) | ORAL | Status: DC
  Administered 2020-02-26 – 2020-02-27 (×4): 500 mg via ORAL
  Filled 2020-02-26 (×4): qty 1

## 2020-02-26 MED ORDER — MAGNESIUM SULFATE 2 GM/50ML IV SOLN
2.0000 g | Freq: Once | INTRAVENOUS | Status: AC
  Administered 2020-02-26: 2 g via INTRAVENOUS
  Filled 2020-02-26: qty 50

## 2020-02-26 MED ORDER — SODIUM CHLORIDE 0.9 % IV SOLN
2.0000 g | INTRAVENOUS | Status: DC
  Administered 2020-02-26 – 2020-02-27 (×2): 2 g via INTRAVENOUS
  Filled 2020-02-26: qty 20
  Filled 2020-02-26: qty 2

## 2020-02-26 MED ORDER — LISINOPRIL-HYDROCHLOROTHIAZIDE 20-25 MG PO TABS: 1.0000 | ORAL_TABLET | Freq: Every morning | ORAL | Status: DC

## 2020-02-26 MED ORDER — LISINOPRIL 20 MG PO TABS
20.0000 mg | ORAL_TABLET | Freq: Every day | ORAL | Status: DC
  Administered 2020-02-26 – 2020-02-27 (×2): 20 mg via ORAL
  Filled 2020-02-26 (×2): qty 1

## 2020-02-26 MED ORDER — HYDROCHLOROTHIAZIDE 25 MG PO TABS
25.0000 mg | ORAL_TABLET | Freq: Every day | ORAL | Status: DC
  Administered 2020-02-26 – 2020-02-27 (×2): 25 mg via ORAL
  Filled 2020-02-26 (×2): qty 1

## 2020-02-26 MED ORDER — MAGNESIUM OXIDE 400 (241.3 MG) MG PO TABS
800.0000 mg | ORAL_TABLET | Freq: Two times a day (BID) | ORAL | Status: DC
  Administered 2020-02-26 – 2020-02-27 (×2): 800 mg via ORAL
  Filled 2020-02-26 (×2): qty 2

## 2020-02-26 NOTE — Progress Notes (Signed)
PT Cancellation Note  Patient Details Name: Brittany Archer MRN: [id number] DOB: 02-14-1941   Cancelled Treatment:    Reason Eval/Treat Not Completed: Other (comment). Pt just amb with nursing staff.    [facility] 02/26/2020, 1:26 PM

## 2020-02-26 NOTE — TOC Initial Note (Signed)
Transition of Care [facility]) - Initial/Assessment Note    Patient Details  Name: Brittany Archer MRN: [id number] Date of Birth: 1941-01-26  [facility] CM/SW Contact:    Trish Mage, LCSW Phone Number: 02/26/2020, 10:48 AM  Clinical Narrative:  Patienet states she will return home to [facility] to her brother's home, where there are several other family members living as well.  She feels good about this plan, stating she has all the support she needs.  Denies the need for any DME, though states she has access to a walker if one is needed.  She is being set up for IV abx through [facility].  HH RN is a bit of a challenge due to [employer]; RN unable to come out until Wednesday. TOC will continue to follow during the course of hospitalization.                  Expected Discharge Plan: [place] Barriers to Discharge: No Barriers Identified   Patient Goals and CMS Choice        Expected Discharge Plan and Services Expected Discharge Plan: [place]   Discharge Planning Services: CM Consult   Living arrangements for the past 2 months: Single Family Home                                      Prior Living Arrangements/Services Living arrangements for the past 2 months: Single Family Home Lives with:: Siblings, Relatives Patient language and need for interpreter reviewed:: Yes Do you feel safe going back to the place where you live?: Yes      Need for Family Participation in Patient Care: Yes (Comment) Care giver support system in place?: Yes (comment)   Criminal Activity/Legal Involvement Pertinent to Current Situation/Hospitalization: No - Comment as needed  Activities of Daily Living Home Assistive Devices/Equipment: None ADL Screening (condition at time of admission) Patient's cognitive ability adequate to safely complete daily activities?: Yes Is the patient deaf or have difficulty hearing?: No Does  the patient have difficulty seeing, even when wearing glasses/contacts?: No Does the patient have difficulty concentrating, remembering, or making decisions?: Yes Patient able to express need for assistance with ADLs?: Yes Does the patient have difficulty dressing or bathing?: No Independently performs ADLs?: Yes (appropriate for developmental age) Does the patient have difficulty walking or climbing stairs?: No Weakness of Legs: None Weakness of Arms/Hands: None  Permission Sought/Granted                  Emotional Assessment Appearance:: Appears stated age Attitude/Demeanor/Rapport: Engaged Affect (typically observed): Appropriate Orientation: : Oriented to Self, Oriented to Place, Oriented to Situation Alcohol / Substance Use: Not Applicable Psych Involvement: No (comment)  Admission diagnosis:  Hypokalemia [E87.6] Acute UTI [N39.0] Facial cellulitis [L03.211] Patient Active Problem List   Diagnosis Date Noted  . Osteomyelitis, jaw acute   . Osteonecrosis ([place])   . Confusion 02/20/2020  . Facial cellulitis 02/19/2020  . Type 2 diabetes mellitus ([place])   . Coronary artery disease   . Hypertension   . Stage 3b chronic kidney disease   . Hypomagnesemia   . Hypophosphatemia   . Hypokalemia 01/03/2020  . CKD (chronic kidney disease) 05/01/2019  . B12 deficiency 02/09/2018  . Multiple myeloma not having achieved remission ([place]) 01/20/2018  . Iron deficiency anemia 11/25/2017   PCP:  [facility]:   [place], [place] - [address] [place] [address] Phone: [phone] Fax: [phone]  [facility] [place] - [address] [place] [address] Phone: [phone] Fax: [phone]     Social Determinants of Health (SDOH) Interventions    Readmission Risk Interventions No flowsheet data found.

## 2020-02-26 NOTE — Progress Notes (Signed)
Pharmacy Antibiotic Note  Brittany Archer is a 79 y.o. female admitted on 02/19/2020.   Pt has PMH significant for T2DM, multiple myeloma, breast cancer. Pt diagnosed with acute OM of mandible secondary to dental abscess/facial cellulitis. Oral surgery following - pt is s/p I&D of abscess, tooth extraction, and closure of submental cutaneous fistula. ID following and managing antibiotics.  Assessment: Today, 02/26/20  WBC 2.9  SCr 0.9, CrCl 44 mL/min. Renal function stable  Afebrile  Day #7 IV antibiotics - currently on piperacillin/tazobactam + vancomycin. PICC placed 2/24, anticipate prolonged IV ABX course.   Plan:  Continue piperacillin/tazobactam 3.375 g IV q8h EI  Vancomycin has been discontinued. Renal function stable, don't anticipate further dose adjustments of piperacillin/tazobactam. Pharmacy to sign off and will peripherally follow dosing. Please re-consult if needed.  Height: _0  (165.1 cm) Weight: 131 lb 9.8 oz (59.7 kg) IBW/kg (Calculated) : 57  Temp (24hrs), Avg:98 F (36.7 C), Min:97.6 F (36.4 C), Max:98.2 F (36.8 C)  Recent Labs  Lab 02/22/20 0322 02/23/20 0449 02/24/20 0410 02/25/20 0338 02/26/20 0424  WBC 3.3* 6.4 4.1 3.9* 2.9*  CREATININE 0.79 0.76 0.95 0.92 0.93    Estimated Creatinine Clearance: 44.1 mL/min (by C-G formula based on SCr of 0.93 mg/dL).    Allergies  Allergen Reactions  . Motrin [Ibuprofen] Rash   Antimicrobials this admission: Vanco 2/23 >> 3/1 Zosyn 2/23 >>  CTX x 1 dose 2/22  Acyclovir as PTA>>  Dose adjustments this admission:  Microbiology results: 2/22 UCx: multiple species present 2/22 wound Cx: Rare staphylococcus lugdunesis (pan-sensitive), no anaerobes isolated.  2/22 HIV NR 2/23 RPR NR 2/25 MRSA PCR neg, MSSA PCR neg 2/25 C diff: neg 2/25 mandible intraoral fistula: rare normal flora; pending 2/25 Extra oral fistula: gram stain neg  Lenis Noon, PharmD 02/26/20 11:06 AM

## 2020-02-26 NOTE — Care Management Important Message (Signed)
Important Message  Patient Details IM Letter given to [facility] SW to present to the Patient Name: Brittany Archer MRN: [id number] Date of Birth: 07/04/1941   Medicare Important Message Given:  Yes     Kerin Salen 02/26/2020, 11:08 AM

## 2020-02-26 NOTE — Progress Notes (Signed)
    [facility] for Infectious Disease   Reason for visit: Follow up on jaw osteomyelitis  Interval History: ready for discharge  Physical Exam: Constitutional:  Vitals:   02/26/20 0505 02/26/20 1043  BP: (!) 101/48 (!) 125/53  Pulse: 71 69  Resp: 18 18  Temp: 98.1 F (36.7 C) 97.9 F (36.6 C)  SpO2: 98% 100%   patient appears in NAD  Impression: jaw osteomyelitis with abscess, debridement on 2/25.   Plan: 1.  Plan for IV ceftriaxone 2 grams daily + metronidazole 500 mg three times per day for 6 weeks through April 7  Labs per [facility] I will change antibiotics now Off of vancomycin. I will arrange follow up in our clinic

## 2020-02-26 NOTE — Progress Notes (Signed)
Subjective: POD4 s/p I&D submental abscess, extraction of remaining mandibular teeth, mandibular debridment, closure of submental cutaneous fistula. Pt states that she is comfortable, and feels ok. Minimal soreness. Tolerating pureed foods.  Objective: Vital signs in last 24 hours: Temp:  [97.6 F (36.4 C)-98.2 F (36.8 C)] 98.1 F (36.7 C) (03/01 0505) Pulse Rate:  [59-74] 71 (03/01 0505) Resp:  [16-20] 18 (03/01 0505) BP: (101-142)/(48-63) 101/48 (03/01 0505) SpO2:  [98 %-100 %] 98 % (03/01 0505) Wt Readings from Last 1 Encounters:  02/20/20 59.7 kg    Intake/Output from previous day: 02/28 0701 - 03/01 0700 In: 377.1 [IV Piggyback:377.1] Out: -  Intake/Output this shift: No intake/output data recorded.  PE: Gen: awake, alert, nad HEENT: mild submental edema - soft, submental dressing in place - area of likely hematoma formation in the submental midline. - this was partially evacuated with a 5cc syringe, no purulence noted.  Intraorally, wounds intact and appear hemostatic. Mild FOM edema. Oropharynx clear.  Recent Labs    02/25/20 0338 02/26/20 0424  WBC 3.9* 2.9*  HGB 8.5* 8.3*  HCT 27.4* 26.7*  PLT 152 154    Recent Labs    02/25/20 0338 02/26/20 0424  NA 139 141  K 3.6 3.5  CL 106 107  CO2 23 23  GLUCOSE 86 98  BUN 7* 9  CREATININE 0.92 0.93  CALCIUM 8.0* 8.7*    Medications: I have reviewed the patient's current medications.  Assessment/Plan: POD4 s/p I&D submental abscess, extraction of remaining mandibular teeth, mandibular debridment, closure of submental cutaneous fistula. Progressing well. Cont IV Vanc/Zosyn; cont holding Xgeva inj for now. Pureed diet. Chlorhexidine rinses tid until further notice. Drains were removed. Submental hematoma evacuated - pressure dressing applied - change submental dressing q8h. Pt may be discharged from a maxillofacial standpoint. Pt to follow up at [facility] in 1 wk following discharge. Call [phone]  to make appt.  *Please call Dr. [name] at [phone] with questions/concerns.   LOS: 7 days   Michael Litter, DMD Oral & Maxillofacial Surgery 02/26/2020, 9:16 AM

## 2020-02-26 NOTE — Progress Notes (Signed)
PROGRESS NOTE  Brittany Archer [facility] HYW:[id number] DOB: 01-23-1941 DOA: 02/19/2020 PCP: [name], PA-C   LOS: 7 days   Brief narrative: As per HPI,  79 years old female with history of left breast cancer, CAD, type 2 diabetes mellitus,, HTN presented to the ER with worsening pain in addition to her abscessed tooth going on for 2 weeks with pain generalized weakness and also had a fall prior to admission. In the ED vitals were stable, UA WBC 11-20, CBC with WBC count 5.1,hemoglobin 12.1 g/dL and platelets 131. CMP showed a potassium of 2.5 and CO2 of 18 mmol/L. Her anion gap was 14. Glucose 140, BUN 38 and creatinine 1.45 mg/dL. Total protein is 6.7 albumin 3.2 g/dL. CT head did not show any acute intracranial abnormality. Maxillofacial CT was ordered and showed findings suspicious of osteomyelitis.  Patient was started on vancomycin and Zosyn and was admitted to the hospital. She is on chemo for multiple myeloma.   Assessment/Plan:  Active Problems:   Hypokalemia   Type 2 diabetes mellitus (HCC)   Coronary artery disease   Hypertension   Stage 3b chronic kidney disease   Hypomagnesemia   Hypophosphatemia   Facial cellulitis   Confusion   Osteomyelitis, jaw acute   Osteonecrosis (HCC)   Acute osteomyelitis of the mandible, dental abscess with facial cellulitis.  CT/MRI scan shows phlegmon and osteomyelitis.  Initially received vancomycin and Zosyn. Vanco has been discontinued by ID and the patient is currently on Zosyn..  Status post incision and drainage and debridement of mandibular osteomyelitis with removal of multiple teeth.  On pured diet.  Continue chlorhexidine mouthwash.  Culture from the mandible from 2/25 with few WBC.  ID recommending continuation of Zosyn while in the hospital and possible long-term antibiotic with Rocephin and oral metronidazole versus ertapenem on discharge.  ID to follow.  Oral surgery has seen the patient today and recommend outpatient  follow-up after discharge.  Patient will have to follow-up in on oral surgery center in 1 week after discharge.  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  Holding denosumab.  We will continue to hold until otherwise by oncology.  Multiple Myeloma on Xgeva q 28 days and Velcade weekly. Xgeva on hold.  Does have an appointment to follow-up with oncology on February 28, 2020.  Hypophosphatemia/Hypomagnesemia improved.  Magnesium 1.5.  Phosphorus 4.3.  Continue to replenish.  Mild hypokalemia.  Improved with replacement.  Type 2 diabetes mellitus Hold Metformin and glipizide.  Continue sliding scale insulin, Accu-Cheks, hemoglobin A1c of 7.6.   Tolerating pured diet.  Last POC glucose was 109.  Coronary artery disease: On aspirin.  No acute chest pain.  Hypertension.  on lisinopril/HCTZ at home.  Will resume starting today.  Stage 3b chronic kidney disease.  Creatinine of 0.9.  Start lisinopril HCTZ today.  VTE Prophylaxis: lovenox subq  Code Status:  Full  Family Communication:    I spoke with the patient's sister Ms.Mae and updated her about the clinical condition of the patient and the plan for disposition home with antibiotic likely by tomorrow.  Disposition Plan:  . Patient is from home . Likely disposition to home likely by am.  Patient has been seen by physical therapy and recommended no skilled therapy needs on discharge.   . Barriers to discharge: ID with recommendations on IV antibiotics on discharge, pending [place] setup.   Consultants:  Oral surgery  Infectious disease  Procedures:  PICC line placement on 02/21/2020  Submental  abscess, extraction of remaining mandibular teeth, mandibular debridment, closure of submental cutaneous fistula by oral surgery on 02/22/2020.  Antibiotics:  . Vancomycin 2/22>2/28 . Zosyn 2/22>  Anti-infectives (From admission, onward)   Start     Dose/Rate Route Frequency Ordered Stop   02/21/20 0000   vancomycin (VANCOREADY) IVPB 750 mg/150 mL     750 mg 150 mL/hr over 60 Minutes Intravenous Every 24 hours 02/20/20 0742     02/20/20 1000  acyclovir (ZOVIRAX) tablet 400 mg     400 mg Oral 2 times daily 02/20/20 0630     02/20/20 0800  piperacillin-tazobactam (ZOSYN) IVPB 3.375 g     3.375 g 12.5 mL/hr over 240 Minutes Intravenous Every 8 hours 02/20/20 0422     02/19/20 2330  piperacillin-tazobactam (ZOSYN) IVPB 3.375 g     3.375 g 100 mL/hr over 30 Minutes Intravenous  Once 02/19/20 2327 02/20/20 0043   02/19/20 2330  vancomycin (VANCOCIN) IVPB 1000 mg/200 mL premix     1,000 mg 200 mL/hr over 60 Minutes Intravenous  Once 02/19/20 2327 02/20/20 0142   02/19/20 2215  cefTRIAXone (ROCEPHIN) 1 g in sodium chloride 0.9 % 100 mL IVPB     1 g 200 mL/hr over 30 Minutes Intravenous  Once 02/19/20 2205 02/19/20 2311     Subjective: Patient was seen and examined at bedside. She denies overt pain, dysphagia, fever, chills or rigor.  No shortness of breath cough or chest pain.  Objective: Vitals:   02/26/20 0505 02/26/20 1043  BP: (!) 101/48 (!) 125/53  Pulse: 71 69  Resp: 18 18  Temp: 98.1 F (36.7 C) 97.9 F (36.6 C)  SpO2: 98% 100%    Intake/Output Summary (Last 24 hours) at 02/26/2020 1118 Last data filed at 02/26/2020 0624 Gross per 24 hour  Intake 377.05 ml  Output --  Net 377.05 ml   Filed Weights   02/19/20 1828 02/20/20 0928  Weight: 70.8 kg 59.7 kg   Body mass index is 21.9 kg/m.   Physical Exam: General:  Average built, not in obvious distress HENT: Normocephalic, pupils equally reacting to light and accommodation.  No scleral pallor or icterus noted. Oral mucosa is moist.  Right submental/submandibular dressing.  Mild trismus. Chest:  Clear breath sounds.  Diminished breath sounds bilaterally. No crackles or wheezes.  CVS: S1 &S2 heard. No murmur.  Regular rate and rhythm. Abdomen: Soft, nontender, nondistended.  Bowel sounds are heard.  Liver is not palpable, no  abdominal mass palpated Extremities: No cyanosis, clubbing or edema.  Peripheral pulses are palpable.  Upper extremity PICC line in place Psych: Alert, awake and oriented, normal mood CNS:  No cranial nerve deficits.  Power equal in all extremities.  No sensory deficits noted.  No cerebellar signs.   Skin: Warm and dry.  Right submandibular/submental area with dressing.   Data Review: I have personally reviewed the following laboratory data and studies,  CBC: Recent Labs  Lab 02/19/20 1930 02/19/20 1930 02/20/20 0347 02/20/20 0347 02/22/20 0322 02/23/20 0449 02/24/20 0410 02/25/20 0338 02/26/20 0424  WBC 5.1   < > 4.8   < > 3.3* 6.4 4.1 3.9* 2.9*  NEUTROABS 4.0  --  3.4  --   --   --   --   --   --   HGB 12.1   < > 10.2*   < > 9.4* 10.0* 8.9* 8.5* 8.3*  HCT 37.4   < > 32.3*   < > 30.2* 31.4* 29.0* 27.4* 26.7*  MCV 91.4   < > 93.6   < > 94.1 93.2 96.0 97.2 97.1  PLT 131*   < > 123*   < > 123* 144* 136* 152 154   < > = values in this interval not displayed.   Basic Metabolic Panel: Recent Labs  Lab 02/21/20 0749 02/21/20 0749 02/22/20 0322 02/23/20 0449 02/24/20 0410 02/25/20 0338 02/26/20 0424  NA 139   < > 139 138 138 139 141  K 2.8*   < > 3.3* 4.1 3.4* 3.6 3.5  CL 110   < > 108 103 103 106 107  CO2 18*   < > 21* _0 GLUCOSE 126*   < > 92 263* 151* 86 98  BUN 13   < > 5* <5* 5* 7* 9  CREATININE 0.95   < > 0.79 0.76 0.95 0.92 0.93  CALCIUM 7.2*   < > 7.1* 6.6* 7.0* 8.0* 8.7*  MG 1.3*   < > 1.4* 1.4* 1.8 1.6* 1.5*  PHOS 2.4*  --  2.1* 2.1* 2.3*  --  4.3   < > = values in this interval not displayed.   Liver Function Tests: Recent Labs  Lab 02/19/20 1930 02/20/20 0347 02/21/20 0749 02/22/20 0322 02/26/20 0424  AST 14* 13* 10* 11* 16  ALT _1 ALKPHOS 75 65 59 56 63  BILITOT 0.9 0.8 1.0 1.2 0.8  PROT 6.7 6.0* 5.6* 5.9* 5.8*  ALBUMIN 3.2* 2.8* 2.5* 2.6* 2.6*   No results for input(s): LIPASE, AMYLASE in the last 168 hours. No results for  input(s): AMMONIA in the last 168 hours. Cardiac Enzymes: No results for input(s): CKTOTAL, CKMB, CKMBINDEX, TROPONINI in the last 168 hours. BNP (last 3 results) No results for input(s): BNP in the last 8760 hours.  ProBNP (last 3 results) No results for input(s): PROBNP in the last 8760 hours.  CBG: Recent Labs  Lab 02/25/20 0729 02/25/20 1156 02/25/20 1618 02/25/20 2117 02/26/20 0751  GLUCAP 93 111* 101* 139* 109*   Recent Results (from the past 240 hour(s))  Urine Culture     Status: Abnormal   Collection Time: 02/19/20  9:40 PM   Specimen: Urine, Clean Catch  Result Value Ref Range Status   Specimen Description   Final    URINE, CLEAN CATCH Performed at [facility], [address], [place], [place] [address]    Special Requests   Final    NONE Performed at [facility], [address] [place], [place] [address]    Culture MULTIPLE SPECIES PRESENT, SUGGEST RECOLLECTION (A)  Final   Report Status 02/21/2020 FINAL  Final  SARS CORONAVIRUS 2 (TAT 6-24 HRS) Nasopharyngeal Nasopharyngeal Swab     Status: None   Collection Time: 02/19/20 10:21 PM   Specimen: Nasopharyngeal Swab  Result Value Ref Range Status   SARS Coronavirus 2 NEGATIVE NEGATIVE Final    Comment: (NOTE) SARS-CoV-2 target nucleic acids are NOT DETECTED. The SARS-CoV-2 RNA is generally detectable in upper and lower respiratory specimens during the acute phase of infection. Negative results do not preclude SARS-CoV-2 infection, do not rule out co-infections with other pathogens, and should not be used as the sole basis for treatment or other patient management decisions. Negative results must be combined with clinical observations, patient history, and epidemiological information. The expected result is Negative. Fact Sheet for Patients: SugarRoll.be Fact Sheet for Healthcare Providers: https://www.woods-mathews.com/ This test is not yet approved or cleared by  the [place]  States FDA and  has been authorized for detection and/or diagnosis of SARS-CoV-2 by FDA under an Emergency Use Authorization (EUA). This EUA will remain  in effect (meaning this test can be used) for the duration of the COVID-19 declaration under Section 56 4(b)(1) of the Act, 21 U.S.C. section 360bbb-3(b)(1), unless the authorization is terminated or revoked sooner. Performed at [facility], [place] [address] [place], [place] [address]   Aerobic/Anaerobic Culture (surgical/deep wound)     Status: None   Collection Time: 02/19/20 11:24 PM   Specimen: Face  Result Value Ref Range Status   Specimen Description   Final    FACE Performed at [facility], [address], [place], [place] [address]    Special Requests   Final    Immunocompromised Performed at [facility], [address] [place], [place] [address]    Gram Stain   Final    FEW WBC PRESENT, PREDOMINANTLY PMN MODERATE GRAM POSITIVE COCCI IN CHAINS IN CLUSTERS RARE GRAM POSITIVE RODS    Culture   Final    RARE STAPHYLOCOCCUS LUGDUNENSIS NO ANAEROBES ISOLATED Performed at [facility], [place] [address], [place], [place] [address]    Report Status 02/24/2020 FINAL  Final   Organism ID, Bacteria STAPHYLOCOCCUS LUGDUNENSIS  Final      Susceptibility   Staphylococcus lugdunensis - MIC*    CIPROFLOXACIN <=0.5 SENSITIVE Sensitive     ERYTHROMYCIN <=0.25 SENSITIVE Sensitive     GENTAMICIN <=0.5 SENSITIVE Sensitive     OXACILLIN SENSITIVE Sensitive     TETRACYCLINE <=1 SENSITIVE Sensitive     VANCOMYCIN <=0.5 SENSITIVE Sensitive     TRIMETH/SULFA <=10 SENSITIVE Sensitive     CLINDAMYCIN <=0.25 SENSITIVE Sensitive     RIFAMPIN <=0.5 SENSITIVE Sensitive     Inducible Clindamycin NEGATIVE Sensitive     * RARE STAPHYLOCOCCUS LUGDUNENSIS  C difficile quick scan w PCR reflex     Status: None   Collection Time: 02/22/20 12:30 AM   Specimen: STOOL  Result Value Ref Range Status   C Diff antigen NEGATIVE  NEGATIVE Final   C Diff toxin NEGATIVE NEGATIVE Final   C Diff interpretation No C. difficile detected.  Final    Comment: Performed at [facility], [place] [address], [place], [place] [address]  Surgical pcr screen     Status: None   Collection Time: 02/22/20  6:12 AM   Specimen: Nasal Mucosa; Nasal Swab  Result Value Ref Range Status   MRSA, PCR NEGATIVE NEGATIVE Final   Staphylococcus aureus NEGATIVE NEGATIVE Final    Comment: (NOTE) The Xpert SA Assay (FDA approved for NASAL specimens in patients 68 years of age and older), is one component of a comprehensive surveillance program. It is not intended to diagnose infection nor to guide or monitor treatment. Performed at [facility], [place] [address], [place], [place] [address]   Aerobic/Anaerobic Culture (surgical/deep wound)     Status: None (Preliminary result)   Collection Time: 02/22/20  5:30 PM   Specimen: PATH Other; Tissue  Result Value Ref Range Status   Specimen Description   Final    ORAL EXTRA ORAL FISTULA Performed at [place] [address] [place], [place] [address]    Special Requests   Final    NONE Performed at [facility], [place] [address], [place], [place] [address]    Gram Stain   Final    ABUNDANT WBC PRESENT, PREDOMINANTLY PMN NO  ORGANISMS SEEN Performed at [facility], [place] [address], [place], [place] [address]    Culture   Final    RARE NORMAL OROPHARYNGEAL FLORA NO ANAEROBES ISOLATED; CULTURE IN PROGRESS FOR 5 DAYS    Report Status PENDING  Incomplete  Aerobic/Anaerobic Culture (surgical/deep wound)     Status: None (Preliminary result)   Collection Time: 02/22/20  5:31 PM   Specimen: PATH Other; Tissue  Result Value Ref Range Status   Specimen Description   Final    MANDIBLE INTRAORAL RIGHT MANDIBULAR Performed at [place] [address], [place], [place] [address]    Special Requests    Final    NONE Performed at [facility], [place] [address] [place], [place] [address]    Gram Stain   Final    FEW WBC PRESENT, PREDOMINANTLY PMN RARE GRAM VARIABLE ROD Performed at [facility], [place] [address] [place], [place] [address]    Culture   Final    RARE NORMAL SKIN FLORA NO ANAEROBES ISOLATED; CULTURE IN PROGRESS FOR 5 DAYS    Report Status PENDING  Incomplete     Studies: No results found.    Flora Lipps, MD  [facility] 02/26/2020

## 2020-02-26 NOTE — Progress Notes (Signed)
PHARMACY CONSULT NOTE FOR:  OUTPATIENT  PARENTERAL ANTIBIOTIC THERAPY (OPAT)  Indication: OM Regimen: Ceftriaxone 2 g IV daily End date: 04/03/20  IV antibiotic discharge orders are pended. To discharging provider:  please sign these orders via discharge navigator,  Select New Orders & click on the button choice - Manage This Unsigned Work.     Lenis Noon, PharmD 02/26/2020, 12:15 PM

## 2020-02-26 NOTE — Anesthesia Postprocedure Evaluation (Signed)
Anesthesia Post Note  Patient: Brittany Archer  Procedure(s) Performed: INCISION AND DRAINAGE WITH DEBRIDEMENT MANDIBLE (N/A ) DENTAL RESTORATION/EXTRACTIONS (N/A )     Anesthesia Post Evaluation  Last Vitals:  Vitals:   02/25/20 2000 02/26/20 0505  BP: (!) 142/63 (!) 101/48  Pulse: 74 71  Resp: 20 18  Temp: 36.8 C 36.7 C  SpO2: 100% 98%    Last Pain:  Vitals:   02/26/20 0505  TempSrc: Oral  PainSc:                  Megen Madewell DAVID

## 2020-02-27 DIAGNOSIS — E11628 Type 2 diabetes mellitus with other skin complications: Secondary | ICD-10-CM

## 2020-02-27 DIAGNOSIS — I10 Essential (primary) hypertension: Secondary | ICD-10-CM

## 2020-02-27 DIAGNOSIS — M879 Osteonecrosis, unspecified: Secondary | ICD-10-CM

## 2020-02-27 LAB — CBC
HCT: 27.4 % — ABNORMAL LOW (ref 36.0–46.0)
Hemoglobin: 8.5 g/dL — ABNORMAL LOW (ref 12.0–15.0)
MCH: 29.9 pg (ref 26.0–34.0)
MCHC: 31 g/dL (ref 30.0–36.0)
MCV: 96.5 fL (ref 80.0–100.0)
Platelets: 160 10*3/uL (ref 150–400)
RBC: 2.84 MIL/uL — ABNORMAL LOW (ref 3.87–5.11)
RDW: 18.5 % — ABNORMAL HIGH (ref 11.5–15.5)
WBC: 4.2 10*3/uL (ref 4.0–10.5)
nRBC: 0 % (ref 0.0–0.2)

## 2020-02-27 LAB — AEROBIC/ANAEROBIC CULTURE W GRAM STAIN (SURGICAL/DEEP WOUND)
Culture: NORMAL
Culture: NORMAL

## 2020-02-27 LAB — GLUCOSE, CAPILLARY
Glucose-Capillary: 111 mg/dL — ABNORMAL HIGH (ref 70–99)
Glucose-Capillary: 234 mg/dL — ABNORMAL HIGH (ref 70–99)

## 2020-02-27 LAB — COMPREHENSIVE METABOLIC PANEL
ALT: 12 U/L (ref 0–44)
AST: 18 U/L (ref 15–41)
Albumin: 2.8 g/dL — ABNORMAL LOW (ref 3.5–5.0)
Alkaline Phosphatase: 67 U/L (ref 38–126)
Anion gap: 12 (ref 5–15)
BUN: 9 mg/dL (ref 8–23)
CO2: 26 mmol/L (ref 22–32)
Calcium: 10.2 mg/dL (ref 8.9–10.3)
Chloride: 100 mmol/L (ref 98–111)
Creatinine, Ser: 0.96 mg/dL (ref 0.44–1.00)
GFR calc Af Amer: >60 mL/min (ref >60)
GFR calc non Af Amer: 56 mL/min — ABNORMAL LOW (ref >60)
Glucose, Bld: 123 mg/dL — ABNORMAL HIGH (ref 70–99)
Potassium: 3.5 mmol/L (ref 3.5–5.1)
Sodium: 138 mmol/L (ref 135–145)
Total Bilirubin: 0.6 mg/dL (ref 0.3–1.2)
Total Protein: 6.3 g/dL — ABNORMAL LOW (ref 6.5–8.1)

## 2020-02-27 LAB — MAGNESIUM: Magnesium: 1.6 mg/dL — ABNORMAL LOW (ref 1.7–2.4)

## 2020-02-27 MED ORDER — METRONIDAZOLE 500 MG PO TABS: 500.0000 mg | ORAL_TABLET | Freq: Three times a day (TID) | ORAL | 0 refills | Status: AC

## 2020-02-27 MED ORDER — CEFTRIAXONE IV (FOR PTA / DISCHARGE USE ONLY): 2.0000 g | INTRAVENOUS | 0 refills | Status: DC

## 2020-02-27 MED ORDER — CHLORHEXIDINE GLUCONATE 0.12 % MT SOLN: 15.0000 mL | Freq: Three times a day (TID) | OROMUCOSAL | 0 refills | Status: AC

## 2020-02-27 MED ORDER — HEPARIN SOD (PORK) LOCK FLUSH 100 UNIT/ML IV SOLN
250.0000 [IU] | INTRAVENOUS | Status: AC | PRN
  Administered 2020-02-27: 250 [IU]
  Filled 2020-02-27: qty 2.5

## 2020-02-27 MED ORDER — HEPARIN SOD (PORK) LOCK FLUSH 100 UNIT/ML IV SOLN
250.0000 [IU] | INTRAVENOUS | Status: AC | PRN
  Administered 2020-02-27: 250 [IU]

## 2020-02-27 MED ORDER — MAGNESIUM OXIDE 400 (241.3 MG) MG PO TABS: 400.0000 mg | ORAL_TABLET | Freq: Two times a day (BID) | ORAL | 0 refills | Status: DC

## 2020-02-27 NOTE — Discharge Summary (Signed)
Physician Discharge Summary  Brittany Archer ENI:[id number] DOB: 10/27/1941 DOA: 02/19/2020  PCP: Brittany Fairy, PA-C  Admit date: 02/19/2020 Discharge date: 02/27/2020  Admitted From: Home  Discharge disposition: home with home health  Recommendations for Outpatient Follow-Up:   . Follow up with your primary care provider in 1 to 2 weeks.   . Follow-up with oncology as has been scheduled tomorrow.  .  Infectious disease clinic will follow up with you.   . Follow-up with Dr. Mancel Archer oral surgeon at oral surgery center in 1 week (call at [phone]) to make an appointment.  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  Diet recommendation: Pured diet until seen by oral surgeon.    Wound care: None.  Code status: Full.   History of Present Illness:  79 years old female with history of left breast cancer, CAD, type 2 diabetes mellitus,, HTN presented to the ER with worsening pain in addition to her abscessed tooth going on for 2 weeks with pain generalized weakness and also had a fall prior to admission. In the ED vitals were stable, UA WBC 11-20, CBC with WBC count 5.1,hemoglobin 12.1 g/dL and platelets 131. CMP showed a potassium of 2.5 and CO2 of 18 mmol/L. Her anion gap was 14. Glucose 140, BUN 38 and creatinine 1.45 mg/dL. Total protein is 6.7 albumin 3.2 g/dL. CT head did not show any acute intracranial abnormality. Maxillofacial CT was ordered and showed findings suspicious of osteomyelitis.Patient was started on vancomycin and Zosyn and was admitted to the hospital. She is on chemo for multiple myeloma.   Hospital Course:   Following conditions were addressed during hospitalization as  listed below,  Acute osteomyelitis of the mandible, dental abscess with facial cellulitis.   Status post incision and drainage and debridement of mandibular osteomyelitis with removal of multiple teeth. CT/MRI scan showed phlegmon and osteomyelitis.  Initially, patient received vancomycin and Zosyn. Vanco was discontinued by ID and the patient was then changed to Zosyn.. On discharge patient will be on Rocephin and  metronidazole for 6 weeks,  on pured diet.  Will be continued on discharge.  Spoke with Dr. Mancel Archer oral Psychologist, sport and exercise. Continue chlorhexidine mouthwash.  Culture from the mandible from 2/25 with few WBC.  Patient will have to follow-up in on oral surgery center in 1 week after discharge.  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 .  We will continue to hold until otherwise by oncology.  Multiple Myeloma on Xgeva q 28 days and Velcade weekly. Xgeva on hold.  Does have an appointment to follow-up with oncology on February 28, 2020.  Oncology will need to decide regarding further treatment plan  Hypophosphatemia/Hypomagnesemia improved.  Magnesium 1.5.  Phosphorus 4.3.    Mild hypokalemia.  Improved with replacement.  Type 2 diabetes mellitus hemoglobin A1c of 7.6.   Tolerating pured diet.  Will be restarted on oral hypoglycemic agents on discharge  Coronary artery disease: On aspirin.  No acute chest pain.  Hypertension.  on lisinopril/HCTZ at home.    Continue on discharge  Stage 3b chronic kidney disease.  Creatinine of 0.9.    Disposition.  At this time, patient is stable for disposition home.  Medical Consultants:    Infectious disease  Oral surgery  Procedures:      Status post incision and drainage and debridement of mandibular osteomyelitis with removal of multiple teeth  PICC line placement on 02/21/2020 Subjective:   Today, patient is okay.  Denies overt pain.  No fever, chills or rigor.  No shortness of breath cough  Discharge  Exam:   Vitals:   02/27/20 0538 02/27/20 0948  BP: (!) 109/59 (!) 117/55  Pulse: 65 67  Resp: 16   Temp: 97.7 F (36.5 C)   SpO2: 97%    Vitals:   02/26/20 1331 02/26/20 2037 02/27/20 0538 02/27/20 0948  BP: (!) 145/66 (!) 132/57 (!) 109/59 (!) 117/55  Pulse: (!) 58 61 65 67  Resp: _0 Temp: 97.7 F (36.5 C) 97.8 F (36.6 C) 97.7 F (36.5 C)   TempSrc: Oral Oral Oral   SpO2: 100% 100% 97%   Weight:      Height:       General: Alert awake, not in obvious distress HENT: pupils equally reacting to light,  No scleral pallor or icterus noted. Oral mucosa is moist.  Right submental submandibular area with dressing. Chest:  Clear breath sounds.  Diminished breath sounds bilaterally. No crackles or wheezes.  CVS: S1 &S2 heard. No murmur.  Regular rate and rhythm. Abdomen: Soft, nontender, nondistended.  Bowel sounds are heard.   Extremities: No cyanosis, clubbing or edema.  Peripheral pulses are palpable. Psych: Alert, awake and oriented, normal mood.  Upper extremity PICC line in place CNS:  No cranial nerve deficits.  Power equal in all extremities.   Skin: Warm and dry.  No rashes noted.  The results of significant diagnostics from this hospitalization (including imaging, microbiology, ancillary and laboratory) are listed below for reference.     Diagnostic Studies:   CT Head Wo Contrast  Result Date: 02/19/2020 CLINICAL DATA:  Right-sided tooth abscess, generalized weakness, fell EXAM: CT HEAD WITHOUT CONTRAST TECHNIQUE: Contiguous axial images were obtained from the base of the skull through the vertex without intravenous contrast. COMPARISON:  None. FINDINGS: Brain: No acute infarct or hemorrhage. Lateral ventricles and midline structures are unremarkable. No acute extra-axial fluid collections. No mass effect. Vascular: No hyperdense vessel or unexpected calcification. Skull: Normal. Negative for fracture or focal lesion. Sinuses/Orbits: No acute finding. Other: None  IMPRESSION: 1. No acute intracranial process. Electronically Signed   By: Randa Ngo M.D.   On: 02/19/2020 19:16   CT MAXILLOFACIAL W CONTRAST  Result Date: 02/20/2020 CLINICAL DATA:  Initial evaluation for right-sided facial swelling, abscess tooth. EXAM: CT MAXILLOFACIAL WITH CONTRAST TECHNIQUE: Multidetector CT imaging of the maxillofacial structures was performed with intravenous contrast. Multiplanar CT image reconstructions were also generated. CONTRAST:  45m OMNIPAQUE IOHEXOL 300 MG/ML  SOLN COMPARISON:  None available. FINDINGS: Osseous: Examination mildly degraded by motion artifact. No acute fracture seen about the facial bones. Remote posttraumatic defect noted at the right lamina papyracea with medial is a shin of a small portion of the intraorbital fat. No discrete osseous lesions. Orbits: Globes and orbital soft tissues within normal limits. No evidence for postseptal or intraorbital cellulitis. Sinuses: Mild-to-moderate mucoperiosteal thickening noted within the right maxillary sinus. Paranasal sinuses are otherwise clear. Mastoid air cells and middle ear cavities are well pneumatized and free of fluid. Soft tissues: Asymmetric soft tissue swelling with inflammatory stranding seen adjacent to the right mandibular body, concerning for acute infection/cellulitis. Innumerable dental caries with periapical lucency seen about  the remaining dentition, likely reflecting the source of infection. There is a more focal area of swelling with soft tissue stranding seen involving the right submental region, likely reflecting phlegmon (series 6, image 39). Changes extend to the overlying skin. No definite discrete abscess or drainable fluid collection at this time. Mild edema seen adjacent to the right mandible without definite discrete odontogenic abscess. There is asymmetric sclerosis with cortical thickening and periosteal reaction seen about the right mandibular body, extending from the parasymphyseal  region to approximately the angle of the mandible, suggesting associated osteomyelitis (series 3, image 68). Limited intracranial: Unremarkable. IMPRESSION: 1. Soft tissue swelling with inflammatory stranding adjacent to the right mandible, concerning for acute infection/cellulitis. A more focal area of phlegmon involves the right submental region. No definite discrete abscess or drainable fluid collection. Underlying poor dentition suggests an odontogenic source. 2. Asymmetric sclerosis with periosteal reaction involving the right mandibular body, suggesting associated osteomyelitis. Electronically Signed   By: Jeannine Boga M.D.   On: 02/20/2020 01:50   MR FACE/TRIGEMINAL WO/W CM  Result Date: 02/20/2020 CLINICAL DATA:  Left jaw pain and swelling EXAM: MRI FACE TRIGEMINAL WITHOUT AND WITH CONTRAST TECHNIQUE: Multiplanar, multiecho pulse sequences of the face and surrounding structures, including thin slice imaging of the course of the Trigeminal Nerves, were obtained both before and after administration of intravenous contrast. CONTRAST:  56m GADAVIST GADOBUTROL 1 MMOL/ML IV SOLN COMPARISON:  Maxillofacial CT earlier same day FINDINGS: Motion artifact is present. As seen on prior CT, there is asymmetric soft tissue thickening in the right submental region with overlying skin thickening. There is associated enhancement. This also involves the fat surrounding the right inferior alveolar nerve. Soft tissue thickening and enhancement are also present along the lingual and buccal aspects of the right mandibular body and angle. There is replacement of normal T1 marrow signal of the adjacent mandible. There is mild enhancement of the marrow at the level of the mandibular foramen (series 9, image 24). Dental disease is better evaluated on the CT. Limited intracranial imaging demonstrates no abnormal enhancement. Orbits are unremarkable. Mild paranasal sinus mucosal thickening. IMPRESSION: Inflammatory changes  along the buccal and lingual aspects of the body and angle of the mandible on the right. There is abnormal signal of the adjacent mandible corresponding to sclerosis on CT likely reflecting sequelae of chronic inflammation. Mild marrow enhancement at the level of the mandibular foramen could reflect an area of more acute osteomyelitis. More focal soft tissue thickening and fluid in the right submental region likely reflecting phlegmon/developing multilocular abscess. Electronically Signed   By: PMacy MisM.D.   On: 02/20/2020 09:06     Labs:   Basic Metabolic Panel: Recent Labs  Lab 02/21/20 0749 02/21/20 0749 02/22/20 0322 02/22/20 0322 02/23/20 0449 02/23/20 0449 02/24/20 0410 02/24/20 0410 02/25/20 0338 02/25/20 0338 02/26/20 0424 02/27/20 0531  NA 139   < > 139   < > 138  --  138  --  139  --  141 138  K 2.8*   < > 3.3*   < > 4.1   < > 3.4*   < > 3.6   < > 3.5 3.5  CL 110   < > 108   < > 103  --  103  --  106  --  107 100  CO2 18*   < > 21*   < > 23  --  23  --  23  --  23 26  GLUCOSE 126*   < >  92   < > 263*  --  151*  --  86  --  98 123*  BUN 13   < > 5*   < > <5*  --  5*  --  7*  --  9 9  CREATININE 0.95   < > 0.79   < > 0.76  --  0.95  --  0.92  --  0.93 0.96  CALCIUM 7.2*   < > 7.1*   < > 6.6*  --  7.0*  --  8.0*  --  8.7* 10.2  MG 1.3*   < > 1.4*   < > 1.4*  --  1.8  --  1.6*  --  1.5* 1.6*  PHOS 2.4*  --  2.1*  --  2.1*  --  2.3*  --   --   --  4.3  --    < > = values in this interval not displayed.   GFR Estimated Creatinine Clearance: 42.8 mL/min (by C-G formula based on SCr of 0.96 mg/dL). Liver Function Tests: Recent Labs  Lab 02/21/20 0749 02/22/20 0322 02/26/20 0424 02/27/20 0531  AST 10* 11* 16 18  ALT _0 ALKPHOS 59 56 63 67  BILITOT 1.0 1.2 0.8 0.6  PROT 5.6* 5.9* 5.8* 6.3*  ALBUMIN 2.5* 2.6* 2.6* 2.8*   No results for input(s): LIPASE, AMYLASE in the last 168 hours. No results for input(s): AMMONIA in the last 168 hours. Coagulation  profile No results for input(s): INR, PROTIME in the last 168 hours.  CBC: Recent Labs  Lab 02/23/20 0449 02/24/20 0410 02/25/20 0338 02/26/20 0424 02/27/20 0531  WBC 6.4 4.1 3.9* 2.9* 4.2  HGB 10.0* 8.9* 8.5* 8.3* 8.5*  HCT 31.4* 29.0* 27.4* 26.7* 27.4*  MCV 93.2 96.0 97.2 97.1 96.5  PLT 144* 136* 152 154 160   Cardiac Enzymes: No results for input(s): CKTOTAL, CKMB, CKMBINDEX, TROPONINI in the last 168 hours. BNP: Invalid input(s): POCBNP CBG: Recent Labs  Lab 02/26/20 0751 02/26/20 1132 02/26/20 1636 02/26/20 2137 02/27/20 0740  GLUCAP 109* 166* 79 178* 111*   D-Dimer No results for input(s): DDIMER in the last 72 hours. Hgb A1c No results for input(s): HGBA1C in the last 72 hours. Lipid Profile No results for input(s): CHOL, HDL, LDLCALC, TRIG, CHOLHDL, LDLDIRECT in the last 72 hours. Thyroid function studies No results for input(s): TSH, T4TOTAL, T3FREE, THYROIDAB in the last 72 hours.  Invalid input(s): FREET3 Anemia work up No results for input(s): VITAMINB12, FOLATE, FERRITIN, TIBC, IRON, RETICCTPCT in the last 72 hours. Microbiology Recent Results (from the past 240 hour(s))  Urine Culture     Status: Abnormal   Collection Time: 02/19/20  9:40 PM   Specimen: Urine, Clean Catch  Result Value Ref Range Status   Specimen Description   Final    URINE, CLEAN CATCH Performed at [facility], [address], [place], [place] [address]    Special Requests   Final    NONE Performed at [facility], [address], [place], [place] [address]    Culture MULTIPLE SPECIES PRESENT, SUGGEST RECOLLECTION (A)  Final   Report Status 02/21/2020 FINAL  Final  SARS CORONAVIRUS 2 (TAT 6-24 HRS) Nasopharyngeal Nasopharyngeal Swab     Status: None   Collection Time: 02/19/20 10:21 PM   Specimen: Nasopharyngeal Swab  Result Value Ref Range Status   SARS Coronavirus 2 NEGATIVE NEGATIVE Final    Comment: (NOTE) SARS-CoV-2 target nucleic acids are NOT DETECTED. The  SARS-CoV-2 RNA is generally detectable in upper and lower respiratory specimens during the acute phase of infection. Negative results do not preclude SARS-CoV-2 infection, do not rule out co-infections with other pathogens, and should not be used as the sole basis for treatment or other patient management decisions. Negative results must be combined with clinical observations, patient history, and epidemiological information. The expected result is Negative. Fact Sheet for Patients: SugarRoll.be Fact Sheet for Healthcare Providers: https://www.woods-mathews.com/ This test is not yet approved or cleared by the [place] FDA and  has been authorized for detection and/or diagnosis of SARS-CoV-2 by FDA under an Emergency Use Authorization (EUA). This EUA will remain  in effect (meaning this test can be used) for the duration of the COVID-19 declaration under Section 56 4(b)(1) of the Act, 21 U.S.C. section 360bbb-3(b)(1), unless the authorization is terminated or revoked sooner. Performed at [facility], [place] [address], [place], [place] [address]   Aerobic/Anaerobic Culture (surgical/deep wound)     Status: None   Collection Time: 02/19/20 11:24 PM   Specimen: Face  Result Value Ref Range Status   Specimen Description   Final    FACE Performed at [facility], [address] [place], [place] [address]    Special Requests   Final    Immunocompromised Performed at [facility], [address] [place], [place] [address]    Gram Stain   Final    FEW WBC PRESENT, PREDOMINANTLY PMN MODERATE GRAM POSITIVE COCCI IN CHAINS IN CLUSTERS RARE GRAM POSITIVE RODS    Culture   Final    RARE STAPHYLOCOCCUS LUGDUNENSIS NO ANAEROBES ISOLATED Performed at [facility], [place] [address] [place], [place] [address]    Report Status 02/24/2020 FINAL  Final   Organism ID, Bacteria STAPHYLOCOCCUS LUGDUNENSIS  Final      Susceptibility    Staphylococcus lugdunensis - MIC*    CIPROFLOXACIN <=0.5 SENSITIVE Sensitive     ERYTHROMYCIN <=0.25 SENSITIVE Sensitive     GENTAMICIN <=0.5 SENSITIVE Sensitive     OXACILLIN SENSITIVE Sensitive     TETRACYCLINE <=1 SENSITIVE Sensitive     VANCOMYCIN <=0.5 SENSITIVE Sensitive     TRIMETH/SULFA <=10 SENSITIVE Sensitive     CLINDAMYCIN <=0.25 SENSITIVE Sensitive     RIFAMPIN <=0.5 SENSITIVE Sensitive     Inducible Clindamycin NEGATIVE Sensitive     * RARE STAPHYLOCOCCUS LUGDUNENSIS  C difficile quick scan w PCR reflex     Status: None   Collection Time: 02/22/20 12:30 AM   Specimen: STOOL  Result Value Ref Range Status   C Diff antigen NEGATIVE NEGATIVE Final   C Diff toxin NEGATIVE NEGATIVE Final   C Diff interpretation No C. difficile detected.  Final    Comment: Performed at [facility], [place] [address] [place], [place] [address]  Surgical pcr screen     Status: None   Collection Time: 02/22/20  6:12 AM   Specimen: Nasal Mucosa; Nasal Swab  Result Value Ref Range Status   MRSA, PCR NEGATIVE NEGATIVE Final   Staphylococcus aureus NEGATIVE NEGATIVE Final    Comment: (NOTE) The Xpert SA Assay (FDA approved for NASAL specimens in patients 52 years of age and older), is one component of a comprehensive surveillance program. It is not intended to diagnose infection nor to guide or monitor treatment. Performed at [facility], [place] [address] [place], [place] [address]   Aerobic/Anaerobic Culture (surgical/deep wound)     Status: None (Preliminary result)  Collection Time: 02/22/20  5:30 PM   Specimen: PATH Other; Tissue  Result Value Ref Range Status   Specimen Description   Final    ORAL EXTRA ORAL FISTULA Performed at [place] [address], [place], [place] [address]    Special Requests   Final    NONE Performed at [facility], [place] [address], [place], [place] [address]    Gram Stain    Final    ABUNDANT WBC PRESENT, PREDOMINANTLY PMN NO ORGANISMS SEEN Performed at [facility], [place] [address], [place], [place] [address]    Culture   Final    RARE NORMAL OROPHARYNGEAL FLORA NO ANAEROBES ISOLATED; CULTURE IN PROGRESS FOR 5 DAYS    Report Status PENDING  Incomplete  Aerobic/Anaerobic Culture (surgical/deep wound)     Status: None (Preliminary result)   Collection Time: 02/22/20  5:31 PM   Specimen: PATH Other; Tissue  Result Value Ref Range Status   Specimen Description   Final    MANDIBLE INTRAORAL RIGHT MANDIBULAR Performed at [place] [address], [place], [place] [address]    Special Requests   Final    NONE Performed at [facility], [place] [address], [place], [place] [address]    Gram Stain   Final    FEW WBC PRESENT, PREDOMINANTLY PMN RARE GRAM VARIABLE ROD Performed at [facility], [place] [address] [place], [place] [address]    Culture   Final    RARE NORMAL SKIN FLORA NO ANAEROBES ISOLATED; CULTURE IN PROGRESS FOR 5 DAYS    Report Status PENDING  Incomplete     Discharge Instructions:   Discharge Instructions    Diet - low sodium heart healthy   Complete by: As directed    Puree diet   Discharge instructions   Complete by: As directed    Follow-up with Dr. Mancel Archer dental surgeon in 1 week (please call for appointment).  Follow-up with infectious disease as scheduled by the clinic.  Continue to take antibiotics as prescribed.  Okay to take Tylenol for mild pain.  Remain on pured diet until seen by your dental surgeon in one week. Keep the appointment your oncology tomorrow and discuss about further treatment with oncology team.   Home infusion instructions   Complete by: As directed    Instructions: Flushing of vascular access device: 0.9% NaCl pre/post medication administration and prn patency; Heparin 100 u/ml, 43m for implanted ports and Heparin 10u/ml, 531mfor all other central venous  catheters.   Increase activity slowly   Complete by: As directed      Allergies as of 02/27/2020      Reactions   Motrin [ibuprofen] Rash      Medication List    STOP taking these medications   potassium chloride 10 MEQ tablet Commonly known as: KLOR-CON     TAKE these medications   acyclovir 400 MG tablet Commonly known as: ZOVIRAX TAKE 1 TABLET BY MOUTH TWICE DAILY   aspirin 81 MG tablet Take 81 mg by mouth daily.   bortezomib IV 3.5 MG injection Commonly known as: VELCADE Inject 3.5 mg into the vein once a week. weekly   cefTRIAXone  IVPB Commonly known as: ROCEPHIN Inject 2 g into the vein daily. Indication:  OM Last Day of Therapy:  04/03/20 Labs - Once weekly:  CBC/D and BMP, Labs - Every other week:  ESR and CRP   chlorhexidine 0.12 % solution Commonly  known as: PERIDEX Use as directed 15 mLs in the mouth or throat 3 (three) times daily for 14 days.   dexamethasone 4 MG tablet Commonly known as: DECADRON Take 10 tablets (40 mg) on days 1, 8, and 15 of chemo. Repeat every 21 days.   glipiZIDE 5 MG tablet Commonly known as: GLUCOTROL Take 5 mg by mouth daily before breakfast.   lenalidomide 20 MG capsule Commonly known as: Revlimid Take 1 capsule by mouth once daily for 14 days on, and 7 days off of a 21 day cycle.   lisinopril-hydrochlorothiazide 20-25 MG tablet Commonly known as: ZESTORETIC Take 1 tablet by mouth every morning.   magnesium oxide 400 (241.3 Mg) MG tablet Commonly known as: MAG-OX Take 1 tablet (400 mg total) by mouth 2 (two) times daily.   metFORMIN 1000 MG tablet Commonly known as: GLUCOPHAGE Take 1,000 mg by mouth 2 times daily at 12 noon and 4 pm.   metroNIDAZOLE 500 MG tablet Commonly known as: FLAGYL Take 1 tablet (500 mg total) by mouth 3 (three) times daily.   potassium chloride SA 20 MEQ tablet Commonly known as: KLOR-CON Take 2 tablets (40 mEq total) by mouth 3 (three) times daily.            Home Infusion  Instuctions  (From admission, onward)         Start     Ordered   02/27/20 0000  Home infusion instructions    Question:  Instructions  Answer:  Flushing of vascular access device: 0.9% NaCl pre/post medication administration and prn patency; Heparin 100 u/ml, 77m for implanted ports and Heparin 10u/ml, 577mfor all other central venous catheters.   02/27/20 [id number]          Time coordinating discharge: 39 minutes  Signed:  Verline Kong  [facility] 02/27/2020, 10:28 AM

## 2020-02-27 NOTE — TOC Progression Note (Signed)
Transition of Care [facility]) - Progression Note    Patient Details  Name: Brittany Archer MRN: [id number] Date of Birth: 1941/08/30  Transition of Care [facility] CM/SW Contact  Joaquin Courts, RN Phone Number: 02/27/2020, 10:06 AM  Clinical Narrative:   CM spoke with Carolynn Sayers and confirmed that everything is in place for pt to dc today with home IV abx.  HHRN is scheduled to go out to the home tomorrow 02/28/20, patient will receive today's dose here in the hospital and will dc home after, Carolynn Sayers has scheduled a meeting with patient's sister to provide education at lunch time.  Charge RN was made aware of this plan.     Expected Discharge Plan: [place] Barriers to Discharge: No Barriers Identified  Expected Discharge Plan and Services Expected Discharge Plan: [place]   Discharge Planning Services: CM Consult Post Acute Care Choice: [place] arrangements for the past 2 months: Single Family Home                                       Social Determinants of Health (SDOH) Interventions    Readmission Risk Interventions No flowsheet data found.

## 2020-02-28 ENCOUNTER — Encounter (HOSPITAL_COMMUNITY): Payer: Self-pay | Admitting: Hematology

## 2020-02-28 ENCOUNTER — Other Ambulatory Visit: Payer: Self-pay

## 2020-02-28 ENCOUNTER — Inpatient Hospital Stay (HOSPITAL_COMMUNITY): Payer: Medicare Other

## 2020-02-28 ENCOUNTER — Inpatient Hospital Stay (HOSPITAL_BASED_OUTPATIENT_CLINIC_OR_DEPARTMENT_OTHER): Payer: Medicare Other | Admitting: Hematology

## 2020-02-28 ENCOUNTER — Inpatient Hospital Stay (HOSPITAL_COMMUNITY): Payer: Medicare Other | Attending: Hematology

## 2020-02-28 DIAGNOSIS — C9000 Multiple myeloma not having achieved remission: Secondary | ICD-10-CM | POA: Insufficient documentation

## 2020-02-28 DIAGNOSIS — D539 Nutritional anemia, unspecified: Secondary | ICD-10-CM | POA: Diagnosis not present

## 2020-02-28 DIAGNOSIS — D630 Anemia in neoplastic disease: Secondary | ICD-10-CM | POA: Insufficient documentation

## 2020-02-28 DIAGNOSIS — M272 Inflammatory conditions of jaws: Secondary | ICD-10-CM | POA: Insufficient documentation

## 2020-02-28 DIAGNOSIS — E876 Hypokalemia: Secondary | ICD-10-CM | POA: Insufficient documentation

## 2020-02-28 LAB — CBC WITH DIFFERENTIAL/PLATELET
Abs Immature Granulocytes: 0.04 10*3/uL (ref 0.00–0.07)
Basophils Absolute: 0.1 10*3/uL (ref 0.0–0.1)
Basophils Relative: 1 %
Eosinophils Absolute: 0.1 10*3/uL (ref 0.0–0.5)
Eosinophils Relative: 2 %
HCT: 30 % — ABNORMAL LOW (ref 36.0–46.0)
Hemoglobin: 9 g/dL — ABNORMAL LOW (ref 12.0–15.0)
Immature Granulocytes: 1 %
Lymphocytes Relative: 16 %
Lymphs Abs: 0.8 10*3/uL (ref 0.7–4.0)
MCH: 29.7 pg (ref 26.0–34.0)
MCHC: 30 g/dL (ref 30.0–36.0)
MCV: 99 fL (ref 80.0–100.0)
Monocytes Absolute: 0.2 10*3/uL (ref 0.1–1.0)
Monocytes Relative: 5 %
Neutro Abs: 3.6 10*3/uL (ref 1.7–7.7)
Neutrophils Relative %: 75 %
Platelets: 198 10*3/uL (ref 150–400)
RBC: 3.03 MIL/uL — ABNORMAL LOW (ref 3.87–5.11)
RDW: 18.5 % — ABNORMAL HIGH (ref 11.5–15.5)
WBC: 4.8 10*3/uL (ref 4.0–10.5)
nRBC: 0 % (ref 0.0–0.2)

## 2020-02-28 LAB — COMPREHENSIVE METABOLIC PANEL
ALT: 13 U/L (ref 0–44)
AST: 18 U/L (ref 15–41)
Albumin: 3 g/dL — ABNORMAL LOW (ref 3.5–5.0)
Alkaline Phosphatase: 69 U/L (ref 38–126)
Anion gap: 11 (ref 5–15)
BUN: 14 mg/dL (ref 8–23)
CO2: 27 mmol/L (ref 22–32)
Calcium: 10.2 mg/dL (ref 8.9–10.3)
Chloride: 99 mmol/L (ref 98–111)
Creatinine, Ser: 1.1 mg/dL — ABNORMAL HIGH (ref 0.44–1.00)
GFR calc Af Amer: 55 mL/min — ABNORMAL LOW (ref >60)
GFR calc non Af Amer: 48 mL/min — ABNORMAL LOW (ref >60)
Glucose, Bld: 219 mg/dL — ABNORMAL HIGH (ref 70–99)
Potassium: 3.4 mmol/L — ABNORMAL LOW (ref 3.5–5.1)
Sodium: 137 mmol/L (ref 135–145)
Total Bilirubin: 0.5 mg/dL (ref 0.3–1.2)
Total Protein: 6.5 g/dL (ref 6.5–8.1)

## 2020-02-28 LAB — LACTATE DEHYDROGENASE: LDH: 168 U/L (ref 98–192)

## 2020-02-28 NOTE — Progress Notes (Signed)
Treatment held today and next week per MD.

## 2020-02-28 NOTE — Progress Notes (Signed)
Patient has been assessed, vital signs and labs have been reviewed by Dr. Delton Coombes. HOLD Tx today due to recent hospitalization and infection.

## 2020-02-28 NOTE — Patient Instructions (Addendum)
[place] at [facility] Discharge Instructions  You were seen today by Dr. Delton Coombes. He went over your recent lab results. STOP taking Revlimid until you see [place] back in 2 weeks. He will see you back in 2 weeks for labs and follow up.   Thank you for choosing [place] at [facility] to provide your oncology and hematology care.  To afford each patient quality time with our provider, please arrive at least 15 minutes before your scheduled appointment time.   If you have a lab appointment with the [facility] please come in thru the  Main Entrance and check in at the main information desk  You need to re-schedule your appointment should you arrive 10 or more minutes late.  We strive to give you quality time with our providers, and arriving late affects you and other patients whose appointments are after yours.  Also, if you no show three or more times for appointments you may be dismissed from the clinic at the providers discretion.     Again, thank you for choosing [facility].  Our hope is that these requests will decrease the amount of time that you wait before being seen by our physicians.       _____________________________________________________________  Should you have questions after your visit to [facility], please contact our office at [phone] between the hours of 8:00 a.m. and 4:30 p.m.  Voicemails left after 4:00 p.m. will not be returned until the following business day.  For prescription refill requests, have your pharmacy contact our office and allow 72 hours.    Cancer Center Support Programs:   > Cancer Support Group  2nd Tuesday of the month 1pm-2pm, Journey Room

## 2020-02-28 NOTE — Progress Notes (Signed)
Brittany Archer, [place] [id number]   CLINIC:  Medical Oncology/Hematology  PCP:  Vesta Mixer [address] [place] [address] [phone]   REASON FOR VISIT:  Follow-up for IgA Kappa Multiple Myeloma   CURRENT THERAPY: RVD  BRIEF ONCOLOGIC HISTORY:  Oncology History  Multiple myeloma not having achieved remission ([place])  01/20/2018 Initial Diagnosis   Multiple myeloma not having achieved remission ([place])   01/26/2018 -  Chemotherapy   The patient had bortezomib SQ (VELCADE) chemo injection 2.5 mg, 1.3 mg/m2 = 2.5 mg, Subcutaneous,  Once, 27 of 29 cycles Administration: 2.5 mg (01/26/2018), 2.5 mg (02/02/2018), 2.5 mg (02/09/2018), 2.5 mg (02/16/2018), 2.5 mg (02/23/2018), 2.5 mg (03/02/2018), 2.5 mg (03/09/2018), 2.5 mg (03/30/2018), 2.5 mg (04/06/2018), 2.5 mg (04/13/2018), 2.5 mg (04/21/2018), 2.5 mg (05/06/2018), 2.5 mg (05/11/2018), 2.5 mg (05/20/2018), 2.5 mg (05/27/2018), 2.5 mg (06/10/2018), 2.5 mg (06/17/2018), 2.5 mg (06/24/2018), 2.5 mg (07/08/2018), 2.5 mg (07/15/2018), 2.5 mg (07/22/2018), 2.5 mg (08/05/2018), 2.5 mg (08/12/2018), 2.5 mg (09/02/2018), 2.5 mg (09/09/2018), 2.5 mg (09/16/2018), 2.5 mg (09/30/2018), 2.5 mg (10/07/2018), 2.5 mg (10/14/2018), 2.5 mg (10/31/2018), 2.5 mg (11/07/2018), 2.5 mg (11/14/2018), 2.5 mg (11/28/2018), 2.25 mg (12/05/2018), 2.25 mg (12/12/2018), 2.25 mg (12/26/2018), 2.25 mg (01/02/2019), 2.25 mg (01/09/2019), 2.25 mg (01/23/2019), 2.25 mg (01/30/2019), 2.25 mg (02/06/2019), 2.25 mg (02/20/2019), 2.25 mg (02/27/2019), 2.25 mg (03/06/2019), 2.25 mg (03/20/2019), 2.25 mg (03/27/2019), 2.25 mg (04/03/2019), 2.25 mg (04/17/2019), 2.25 mg (04/24/2019), 2.25 mg (05/01/2019), 2.25 mg (05/15/2019), 2.25 mg (05/23/2019), 2.25 mg (05/30/2019), 2.25 mg (06/13/2019), 2.25 mg (06/20/2019), 2.25 mg (06/27/2019), 2.25 mg (07/11/2019), 2.25 mg (07/18/2019), 2.25 mg (07/25/2019), 2.25 mg (08/08/2019), 2.25 mg (08/15/2019), 2.25 mg (08/22/2019), 2.25 mg (09/05/2019), 2.25 mg  (09/12/2019), 2.25 mg (09/19/2019), 2.25 mg (10/03/2019), 2.25 mg (10/10/2019), 2.25 mg (10/17/2019), 2.25 mg (10/31/2019), 2.25 mg (11/07/2019), 2.25 mg (11/14/2019), 2.25 mg (11/29/2019), 2.25 mg (12/06/2019), 2.25 mg (12/13/2019), 2.25 mg (01/03/2020), 2.25 mg (01/10/2020), 2.25 mg (01/17/2020), 2.25 mg (01/31/2020), 2.25 mg (02/07/2020), 2.25 mg (02/14/2020)  for chemotherapy treatment.         INTERVAL HISTORY:  Brittany Archer 79 y.o. female seen for follow-up of multiple myeloma and toxicity assessment prior to next cycle of treatment.  She was recently hospitalized from 02/19/2020 through 02/27/2020 with facial cellulitis and swelling.  She was diagnosed with osteomyelitis of the mandible.  She is currently receiving antibiotics.  She is able to eat soft foods.  Numbness in the hands and feet has been stable.  Appetite is 75%.  Energy levels are 50%.  REVIEW OF SYSTEMS:  Review of Systems  Neurological: Positive for numbness.  All other systems reviewed and are negative.    PAST MEDICAL/SURGICAL HISTORY:  Past Medical History:  Diagnosis Date   Breast cancer ([place])    left breast/ 2008/ surg/ rad tx   Coronary artery disease    Diabetes mellitus    Past Surgical History:  Procedure Laterality Date   ABDOMINAL HYSTERECTOMY     BREAST SURGERY     DEBRIDEMENT MANDIBLE N/A 02/22/2020   Procedure: INCISION AND DRAINAGE WITH DEBRIDEMENT MANDIBLE;  Surgeon: Michael Litter, DMD;  Location: [facility];  Service: Oral Surgery;  Laterality: N/A;   TOOTH EXTRACTION N/A 02/22/2020   Procedure: DENTAL RESTORATION/EXTRACTIONS;  Surgeon: Michael Litter, DMD;  Location: [facility];  Service: Oral Surgery;  Laterality: N/A;  DENTAL KIT REQUESTED     SOCIAL HISTORY:  Social History   Socioeconomic History   Marital  status: Divorced    Spouse name: Not on file   Number of children: Not on file   Years of education: Not on file   Highest education level: Not on file  Occupational History   Not on file    Tobacco Use   Smoking status: Never Smoker   Smokeless tobacco: Never Used  Substance and Sexual Activity   Alcohol use: No   Drug use: No   Sexual activity: Yes    Birth control/protection: Surgical  Other Topics Concern   Not on file  Social History Narrative   Not on file   Social Determinants of Health   Financial Resource Strain:    Difficulty of Paying Living Expenses: Not on file  Food Insecurity:    Worried About [place] in the Last Year: Not on file   Ran Out of Food in the Last Year: Not on file  Transportation Needs:    Lack of Transportation (Medical): Not on file   Lack of Transportation (Non-Medical): Not on file  Physical Activity:    Days of Exercise per Week: Not on file   Minutes of Exercise per Session: Not on file  Stress:    Feeling of Stress : Not on file  Social Connections:    Frequency of Communication with Friends and Family: Not on file   Frequency of Social Gatherings with Friends and Family: Not on file   Attends Religious Services: Not on file   Active Member of Clubs or Organizations: Not on file   Attends Archivist Meetings: Not on file   Marital Status: Not on file  Intimate Partner Violence:    Fear of Current or Ex-Partner: Not on file   Emotionally Abused: Not on file   Physically Abused: Not on file   Sexually Abused: Not on file    FAMILY HISTORY:  Family History  Problem Relation Age of Onset   Obesity Sister     CURRENT MEDICATIONS:  Outpatient Encounter Medications as of 02/28/2020  Medication Sig Note   acyclovir (ZOVIRAX) 400 MG tablet TAKE 1 TABLET BY MOUTH TWICE DAILY (Patient taking differently: Take 400 mg by mouth 2 (two) times daily. )    aspirin 81 MG tablet Take 81 mg by mouth daily.      bortezomib IV (VELCADE) 3.5 MG injection Inject 3.5 mg into the vein once a week. weekly     cefTRIAXone (ROCEPHIN) IVPB Inject 2 g into the vein daily. Indication:  OM Last  Day of Therapy:  04/03/20 Labs - Once weekly:  CBC/D and BMP, Labs - Every other week:  ESR and CRP    chlorhexidine (PERIDEX) 0.12 % solution Use as directed 15 mLs in the mouth or throat 3 (three) times daily for 14 days.    dexamethasone (DECADRON) 4 MG tablet Take 10 tablets (40 mg) on days 1, 8, and 15 of chemo. Repeat every 21 days. 02/19/2020: Due to take on 02/25/2020   glipiZIDE (GLUCOTROL) 5 MG tablet Take 5 mg by mouth daily before breakfast.     lisinopril-hydrochlorothiazide (PRINZIDE,ZESTORETIC) 20-25 MG tablet Take 1 tablet by mouth every morning.     magnesium oxide (MAG-OX) 400 (241.3 Mg) MG tablet Take 1 tablet (400 mg total) by mouth 2 (two) times daily.    metFORMIN (GLUCOPHAGE) 1000 MG tablet Take 1,000 mg by mouth 2 times daily at 12 noon and 4 pm.      metroNIDAZOLE (FLAGYL) 500 MG tablet Take 1 tablet (500  mg total) by mouth 3 (three) times daily.    potassium chloride SA (KLOR-CON) 20 MEQ tablet Take 2 tablets (40 mEq total) by mouth 3 (three) times daily.    [DISCONTINUED] cefTRIAXone (ROCEPHIN) 10 g injection     [DISCONTINUED] lenalidomide (REVLIMID) 20 MG capsule Take 1 capsule by mouth once daily for 14 days on, and 7 days off of a 21 day cycle.    No facility-administered encounter medications on file as of 02/28/2020.    ALLERGIES:  Allergies  Allergen Reactions   Motrin [Ibuprofen] Rash     PHYSICAL EXAM:  ECOG Performance status: 1  Vitals:   02/28/20 1041  BP: 107/62  Pulse: 85  Resp: 16  Temp: (!) 97.1 F (36.2 C)  SpO2: 99%   Filed Weights   02/28/20 1041  Weight: 136 lb 9.6 oz (62 kg)    Physical Exam Vitals reviewed.  Constitutional:      Appearance: Normal appearance. She is obese.  HENT:     Head: Normocephalic.     Nose: Nose normal.     Mouth/Throat:     Mouth: Mucous membranes are moist.     Pharynx: Oropharynx is clear.  Eyes:     Extraocular Movements: Extraocular movements intact.     Conjunctiva/sclera:  Conjunctivae normal.  Cardiovascular:     Rate and Rhythm: Normal rate and regular rhythm.     Pulses: Normal pulses.     Heart sounds: Normal heart sounds.  Pulmonary:     Effort: Pulmonary effort is normal.     Breath sounds: Normal breath sounds.  Abdominal:     General: Bowel sounds are normal.     Palpations: Abdomen is soft.  Musculoskeletal:        General: Normal range of motion.     Cervical back: Normal range of motion.  Skin:    General: Skin is warm and dry.  Neurological:     General: No focal deficit present.     Mental Status: She is alert and oriented to person, place, and time. Mental status is at baseline.  Psychiatric:        Mood and Affect: Mood normal.        Behavior: Behavior normal.        Thought Content: Thought content normal.        Judgment: Judgment normal.    Mild submental swelling with bandage in place.  LABORATORY DATA:  I have reviewed the labs as listed.  CBC    Component Value Date/Time   WBC 4.8 02/28/2020 0949   RBC 3.03 (L) 02/28/2020 0949   HGB 9.0 (L) 02/28/2020 0949   HCT 30.0 (L) 02/28/2020 0949   PLT 198 02/28/2020 0949   MCV 99.0 02/28/2020 0949   MCH 29.7 02/28/2020 0949   MCHC 30.0 02/28/2020 0949   RDW 18.5 (H) 02/28/2020 0949   LYMPHSABS 0.8 02/28/2020 0949   MONOABS 0.2 02/28/2020 0949   EOSABS 0.1 02/28/2020 0949   BASOSABS 0.1 02/28/2020 0949   CMP Latest Ref Rng & Units 02/28/2020 02/27/2020 02/26/2020  Glucose 70 - 99 mg/dL 219(H) 123(H) 98  BUN 8 - 23 mg/dL _0 Creatinine 0.44 - 1.00 mg/dL 1.10(H) 0.96 0.93  Sodium 135 - 145 mmol/L 137 138 141  Potassium 3.5 - 5.1 mmol/L 3.4(L) 3.5 3.5  Chloride 98 - 111 mmol/L 99 100 107  CO2 22 - 32 mmol/L _1 Calcium 8.9 - 10.3 mg/dL 10.2 10.2 8.7(L)  Total Protein 6.5 - 8.1 g/dL 6.5 6.3(L) 5.8(L)  Total Bilirubin 0.3 - 1.2 mg/dL 0.5 0.6 0.8  Alkaline Phos 38 - 126 U/L 69 67 63  AST 15 - 41 U/L _0 ALT 0 - 44 U/L _1 I have independently  reviewed the scans and discussed with the patient.   ASSESSMENT & PLAN:   Multiple myeloma not having achieved remission ([place]) 1.  IgA kappa plasma cell myeloma, stage I, standard risk: -RVD regimen started on 01/26/2018. -She was recently hospitalized from 02/19/2020 through 02/27/2020 with osteomyelitis of the mandible with dental abscess and facial cellulitis. -I have reviewed all the hospitalization records. -We reviewed myeloma panel from 01/31/2020.  M spike is 0.5 g.  Kappa light chains are 30.9, previously 44.1.  Ratio is 1.32. -CBC showed white count 4.8 and normal platelet count. -She is to continue antibiotics.  However swelling of the face has improved although not quite normal yet. -Hence I will hold her treatment today.  I will reevaluate her in 2 weeks.  She was told to hold her Revlimid at home.  2.  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  We will hold denosumab indefinitely until cleared by oral surgeon. -She will continue calcium and vitamin D supplements.  3.  Severe hypokalemia: -Potassium today is 3.4 which is much better than before. -She will continue potassium 40 mEq 3 times a day at home.  4.  Normocytic anemia: -Hemoglobin today is 9.  She will receive Retacrit as needed.   Orders placed this encounter:  No orders of the defined types were placed in this encounter.     Derek Jack, MD  [place] [phone]

## 2020-02-29 ENCOUNTER — Telehealth: Payer: Self-pay

## 2020-02-29 LAB — KAPPA/LAMBDA LIGHT CHAINS
Kappa free light chain: 35 mg/L — ABNORMAL HIGH (ref 3.3–19.4)
Kappa, lambda light chain ratio: 1.32 (ref 0.26–1.65)
Lambda free light chains: 26.5 mg/L — ABNORMAL HIGH (ref 5.7–26.3)

## 2020-02-29 LAB — PROTEIN ELECTROPHORESIS, SERUM
A/G Ratio: 0.9 (ref 0.7–1.7)
Albumin ELP: 2.7 g/dL — ABNORMAL LOW (ref 2.9–4.4)
Alpha-1-Globulin: 0.3 g/dL (ref 0.0–0.4)
Alpha-2-Globulin: 1 g/dL (ref 0.4–1.0)
Beta Globulin: 0.9 g/dL (ref 0.7–1.3)
Gamma Globulin: 0.9 g/dL (ref 0.4–1.8)
Globulin, Total: 3 g/dL (ref 2.2–3.9)
Total Protein ELP: 5.7 g/dL — ABNORMAL LOW (ref 6.0–8.5)

## 2020-02-29 NOTE — Telephone Encounter (Signed)
Received call from Ramirez-Perez with [facility] home health requesting verbal orders for patient. Discharge instructions for patients antibiotics were given to RN and verbal order for weekly PICC care was given.   Mohamud Mrozek Lorita Officer, RN

## 2020-03-01 ENCOUNTER — Other Ambulatory Visit (HOSPITAL_COMMUNITY): Payer: Self-pay | Admitting: *Deleted

## 2020-03-01 DIAGNOSIS — C9000 Multiple myeloma not having achieved remission: Secondary | ICD-10-CM

## 2020-03-01 MED ORDER — LENALIDOMIDE 20 MG PO CAPS: ORAL_CAPSULE | ORAL | 0 refills | Status: DC

## 2020-03-01 NOTE — Telephone Encounter (Signed)
Chart reviewed, per last note, revlimid refilled.

## 2020-03-02 NOTE — Assessment & Plan Note (Signed)
1.  IgA kappa plasma cell myeloma, stage I, standard risk: -RVD regimen started on 01/26/2018. -She was recently hospitalized from 02/19/2020 through 02/27/2020 with osteomyelitis of the mandible with dental abscess and facial cellulitis. -I have reviewed all the hospitalization records. -We reviewed myeloma panel from 01/31/2020.  M spike is 0.5 g.  Kappa light chains are 30.9, previously 44.1.  Ratio is 1.32. -CBC showed white count 4.8 and normal platelet count. -She is to continue antibiotics.  However swelling of the face has improved although not quite normal yet. -Hence I will hold her treatment today.  I will reevaluate her in 2 weeks.  She was told to hold her Revlimid at home.  2.  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  We will hold denosumab indefinitely until cleared by oral surgeon. -She will continue calcium and vitamin D supplements.  3.  Severe hypokalemia: -Potassium today is 3.4 which is much better than before. -She will continue potassium 40 mEq 3 times a day at home.  4.  Normocytic anemia: -Hemoglobin today is 9.  She will receive Retacrit as needed.

## 2020-03-05 LAB — IMMUNOFIXATION ELECTROPHORESIS
IgA: 215 mg/dL (ref 64–422)
IgG (Immunoglobin G), Serum: 836 mg/dL (ref 586–1602)
IgM (Immunoglobulin M), Srm: 109 mg/dL (ref 26–217)
Total Protein ELP: 5.8 g/dL — ABNORMAL LOW (ref 6.0–8.5)

## 2020-03-06 ENCOUNTER — Other Ambulatory Visit (HOSPITAL_COMMUNITY): Payer: Medicare Other

## 2020-03-06 ENCOUNTER — Ambulatory Visit (HOSPITAL_COMMUNITY): Payer: Medicare Other

## 2020-03-11 ENCOUNTER — Encounter: Payer: Self-pay | Admitting: Internal Medicine

## 2020-03-11 ENCOUNTER — Telehealth: Payer: Self-pay

## 2020-03-11 NOTE — Telephone Encounter (Signed)
Beverlee Nims, RN from [facility] called office with critical labs. States that hemoglobin was 6.9. Will notify PCP as well. Will fax labs to office for review. [place]

## 2020-03-12 NOTE — Telephone Encounter (Signed)
Sounds like this was a bad sample yesterday.  Repeating today. thanks

## 2020-03-12 NOTE — Telephone Encounter (Signed)
Brittany Archer from [facility] called to let Dr Linus Salmons know that although she will mark the labs today as STAT, "[facility] does what it wants to do, runs the labs when it wants to."  She states it may still take 2+ days for the results, suggested that if we truly want a result STAT that we bring the patient into the office for lab draw.  Writer requested that the [facility] follow her procedure for STAT labs, making sure that they are marked appropriately. Brittany Gandy, RN

## 2020-03-12 NOTE — Telephone Encounter (Signed)
Jeani Hawking from [facility] called to note patients WBC of 2.7 from yesterday's blood draw. Forwarding to provider.   Sulay Brymer Lorita Officer, RN

## 2020-03-12 NOTE — Telephone Encounter (Signed)
Jeani Hawking, Pharmacist with [facility] called office with repeat lab results. States patients Hemoglobin from today was 7.5 and WBC:3.2. Will fax results to office to be reviewed. [place]

## 2020-03-12 NOTE — Telephone Encounter (Signed)
That is a big drop.  Either repeat it this am stat or go to the ED.   thanks

## 2020-03-12 NOTE — Telephone Encounter (Signed)
Home health called and notified. Will repeat stat labs today. Will call patient to follow up on how she is feeling. [place]

## 2020-03-13 ENCOUNTER — Inpatient Hospital Stay (HOSPITAL_COMMUNITY): Payer: Medicare Other

## 2020-03-13 ENCOUNTER — Other Ambulatory Visit (HOSPITAL_COMMUNITY): Payer: Medicare Other

## 2020-03-13 ENCOUNTER — Inpatient Hospital Stay (HOSPITAL_COMMUNITY): Payer: Medicare Other | Admitting: Hematology

## 2020-03-13 ENCOUNTER — Ambulatory Visit (HOSPITAL_COMMUNITY): Payer: Medicare Other

## 2020-03-13 NOTE — Telephone Encounter (Signed)
Thanks, more stable.  Nothing to do at this time.

## 2020-03-18 ENCOUNTER — Other Ambulatory Visit: Payer: Self-pay

## 2020-03-18 ENCOUNTER — Encounter: Payer: Self-pay | Admitting: Internal Medicine

## 2020-03-18 ENCOUNTER — Ambulatory Visit (INDEPENDENT_AMBULATORY_CARE_PROVIDER_SITE_OTHER): Payer: Medicare Other | Admitting: Internal Medicine

## 2020-03-18 ENCOUNTER — Telehealth: Payer: Self-pay

## 2020-03-18 VITALS — BP 143/71 | HR 83 | Wt 145.0 lb

## 2020-03-18 DIAGNOSIS — Z452 Encounter for adjustment and management of vascular access device: Secondary | ICD-10-CM

## 2020-03-18 DIAGNOSIS — M272 Inflammatory conditions of jaws: Secondary | ICD-10-CM

## 2020-03-18 DIAGNOSIS — D72819 Decreased white blood cell count, unspecified: Secondary | ICD-10-CM

## 2020-03-18 NOTE — Telephone Encounter (Signed)
Per Dr. Linus Salmons, okay to pull PICC after last dose of IV abx. Relayed orders to Coretta at [facility]. Patient notified.   Dorse Locy Lorita Officer, RN

## 2020-03-18 NOTE — Assessment & Plan Note (Signed)
This is chronic and last check on 3/15 was 2.9.  Always concern with effect from ceftriaxone but will continue and monitor.  If < 2, will consider stopping ceftriaxone.  Monitoring weekly

## 2020-03-18 NOTE — Assessment & Plan Note (Signed)
Improving and will continue with current antibiotics through April 7th.  I will see her after that to be sure she is doing well off the antibiotics.

## 2020-03-18 NOTE — Progress Notes (Signed)
..  Pharmacist Chemotherapy Monitoring - Follow Up Assessment    I verify that I have reviewed each item in the below checklist:  . Regimen for the patient is scheduled for the appropriate day and plan matches scheduled date. Marland Kitchen Appropriate non-routine labs are ordered dependent on drug ordered. . If applicable, additional medications reviewed and ordered per protocol based on lifetime cumulative doses and/or treatment regimen.   Plan for follow-up and/or issues identified: No . I-vent associated with next due treatment:  . MD and/or nursing notified:   Wynona Neat 03/18/2020 1:50 PM

## 2020-03-18 NOTE — Assessment & Plan Note (Signed)
Working well and no complications.  Ok to remove after her last dose on April 7th.

## 2020-03-18 NOTE — Progress Notes (Signed)
   Subjective:    Patient ID: Brittany Archer, female    DOB: December 12, 1941, 79 y.o.   MRN: [id number]  HPI Here for follow up on jaw osteomyelitis. She had a long standing infection and underwent debridement by Dr. [name] on 2/25 after previous outpatient antibiotics.  Culture did not grow anything and she has been on ceftriaxone and oral metronidazole.  No issues with the picc, no associated rash or diarrhea.  No complaints. Mouth doing better.     Review of Systems  Constitutional: Negative for chills, fever and unexpected weight change.  Gastrointestinal: Negative for diarrhea and nausea.  Skin: Negative for rash.       Objective:   Physical Exam Constitutional:      Appearance: Normal appearance.  Eyes:     General: No scleral icterus. Pulmonary:     Effort: Pulmonary effort is normal.  Neurological:     General: No focal deficit present.     Mental Status: She is alert.  Psychiatric:        Mood and Affect: Mood normal.   SH: no tobacco       Assessment & Plan:

## 2020-03-19 ENCOUNTER — Other Ambulatory Visit: Payer: Self-pay | Admitting: Internal Medicine

## 2020-03-19 ENCOUNTER — Other Ambulatory Visit (HOSPITAL_COMMUNITY): Payer: Self-pay | Admitting: *Deleted

## 2020-03-19 ENCOUNTER — Telehealth: Payer: Self-pay | Admitting: Pharmacist

## 2020-03-19 DIAGNOSIS — C9000 Multiple myeloma not having achieved remission: Secondary | ICD-10-CM

## 2020-03-19 DIAGNOSIS — M869 Osteomyelitis, unspecified: Secondary | ICD-10-CM

## 2020-03-19 DIAGNOSIS — M272 Inflammatory conditions of jaws: Secondary | ICD-10-CM

## 2020-03-19 DIAGNOSIS — D649 Anemia, unspecified: Secondary | ICD-10-CM

## 2020-03-19 NOTE — Addendum Note (Signed)
Addended by: Landis Gandy on: 03/19/2020 03:49 PM   Modules accepted: Orders

## 2020-03-19 NOTE — Telephone Encounter (Signed)
That is a lot.  It needs to be replaced by IR non-emergently.  thanks

## 2020-03-19 NOTE — Telephone Encounter (Signed)
Patient is currently on IV ceftriaxone until 4/7 for osteo per Dr. Linus Salmons. She has a history of anemia and her hemoglobin on 3/16 was 7.5 (previously 6.9 on 3/15). Reviewed her labs today and her hemoglobin from 3/22 was 7.5. Will continue to watch closely and notify Dr. Linus Salmons and Dr. Delton Coombes if it drops below 7 so that a transfusion can be arranged for her.

## 2020-03-19 NOTE — Telephone Encounter (Signed)
Per Stanton Kidney at [facility], patient's home health nurse called to report patient's PICC migrated overnight, has 9 cm exposed. Please advise. Landis Gandy, RN

## 2020-03-19 NOTE — Telephone Encounter (Signed)
PICC order placed, voicemail left for [facility] in IR. OK to keep using for IV antibiotics until replaced, or hold?

## 2020-03-19 NOTE — Telephone Encounter (Signed)
Thanks Cassie 

## 2020-03-20 ENCOUNTER — Telehealth (HOSPITAL_COMMUNITY): Payer: Self-pay | Admitting: Radiology

## 2020-03-20 ENCOUNTER — Inpatient Hospital Stay (HOSPITAL_BASED_OUTPATIENT_CLINIC_OR_DEPARTMENT_OTHER): Payer: Medicare Other | Admitting: Hematology

## 2020-03-20 ENCOUNTER — Inpatient Hospital Stay (HOSPITAL_COMMUNITY): Payer: Medicare Other

## 2020-03-20 ENCOUNTER — Other Ambulatory Visit: Payer: Self-pay

## 2020-03-20 ENCOUNTER — Encounter (HOSPITAL_COMMUNITY): Payer: Self-pay | Admitting: Hematology

## 2020-03-20 VITALS — BP 134/60 | HR 74 | Temp 97.1°F | Resp 18

## 2020-03-20 DIAGNOSIS — E538 Deficiency of other specified B group vitamins: Secondary | ICD-10-CM

## 2020-03-20 DIAGNOSIS — C9000 Multiple myeloma not having achieved remission: Secondary | ICD-10-CM

## 2020-03-20 DIAGNOSIS — D649 Anemia, unspecified: Secondary | ICD-10-CM

## 2020-03-20 LAB — COMPREHENSIVE METABOLIC PANEL
ALT: 8 U/L (ref 0–44)
AST: 13 U/L — ABNORMAL LOW (ref 15–41)
Albumin: 3 g/dL — ABNORMAL LOW (ref 3.5–5.0)
Alkaline Phosphatase: 51 U/L (ref 38–126)
Anion gap: 7 (ref 5–15)
BUN: 5 mg/dL — ABNORMAL LOW (ref 8–23)
CO2: 22 mmol/L (ref 22–32)
Calcium: 7.9 mg/dL — ABNORMAL LOW (ref 8.9–10.3)
Chloride: 112 mmol/L — ABNORMAL HIGH (ref 98–111)
Creatinine, Ser: 0.75 mg/dL (ref 0.44–1.00)
GFR calc Af Amer: >60 mL/min (ref >60)
GFR calc non Af Amer: >60 mL/min (ref >60)
Glucose, Bld: 81 mg/dL (ref 70–99)
Potassium: 2.7 mmol/L — CL (ref 3.5–5.1)
Sodium: 141 mmol/L (ref 135–145)
Total Bilirubin: 0.7 mg/dL (ref 0.3–1.2)
Total Protein: 5.9 g/dL — ABNORMAL LOW (ref 6.5–8.1)

## 2020-03-20 LAB — CBC WITH DIFFERENTIAL/PLATELET
Abs Immature Granulocytes: 0.01 10*3/uL (ref 0.00–0.07)
Basophils Absolute: 0 10*3/uL (ref 0.0–0.1)
Basophils Relative: 1 %
Eosinophils Absolute: 0.1 10*3/uL (ref 0.0–0.5)
Eosinophils Relative: 3 %
HCT: 24.7 % — ABNORMAL LOW (ref 36.0–46.0)
Hemoglobin: 7.8 g/dL — ABNORMAL LOW (ref 12.0–15.0)
Immature Granulocytes: 0 %
Lymphocytes Relative: 29 %
Lymphs Abs: 0.9 10*3/uL (ref 0.7–4.0)
MCH: 31.3 pg (ref 26.0–34.0)
MCHC: 31.6 g/dL (ref 30.0–36.0)
MCV: 99.2 fL (ref 80.0–100.0)
Monocytes Absolute: 0.3 10*3/uL (ref 0.1–1.0)
Monocytes Relative: 10 %
Neutro Abs: 1.8 10*3/uL (ref 1.7–7.7)
Neutrophils Relative %: 57 %
Platelets: 140 10*3/uL — ABNORMAL LOW (ref 150–400)
RBC: 2.49 MIL/uL — ABNORMAL LOW (ref 3.87–5.11)
RDW: 18.8 % — ABNORMAL HIGH (ref 11.5–15.5)
WBC: 3.1 10*3/uL — ABNORMAL LOW (ref 4.0–10.5)
nRBC: 0 % (ref 0.0–0.2)

## 2020-03-20 MED ORDER — POTASSIUM CHLORIDE CRYS ER 20 MEQ PO TBCR
40.0000 meq | EXTENDED_RELEASE_TABLET | Freq: Once | ORAL | Status: AC
  Administered 2020-03-20: 40 meq via ORAL
  Filled 2020-03-20: qty 2

## 2020-03-20 MED ORDER — EPOETIN ALFA-EPBX 10000 UNIT/ML IJ SOLN
20000.0000 [IU] | Freq: Once | INTRAMUSCULAR | Status: AC
  Administered 2020-03-20: 20000 [IU] via SUBCUTANEOUS
  Filled 2020-03-20: qty 2

## 2020-03-20 MED ORDER — POTASSIUM CHLORIDE 10 MEQ/100ML IV SOLN
10.0000 meq | INTRAVENOUS | Status: AC
  Administered 2020-03-20 (×2): 10 meq via INTRAVENOUS
  Filled 2020-03-20 (×2): qty 100

## 2020-03-20 MED ORDER — SODIUM CHLORIDE 0.9 % IV SOLN: INTRAVENOUS | Status: DC

## 2020-03-20 MED ORDER — CYANOCOBALAMIN 1000 MCG/ML IJ SOLN
1000.0000 ug | Freq: Once | INTRAMUSCULAR | Status: AC
  Administered 2020-03-20: 1000 ug via INTRAMUSCULAR
  Filled 2020-03-20: qty 1

## 2020-03-20 NOTE — Telephone Encounter (Signed)
Tried to call pt to schedule PICC replacement. No answer and no VM. Called and left [place] w/ ID a message. JM

## 2020-03-20 NOTE — Assessment & Plan Note (Addendum)
1.  IgA kappa plasma cell myeloma, stage I, status: -RVD regimen started on 01/26/2018. -Reviewed myeloma panel from 02/28/2020.  SPEP shows M spike not detected.  Immunofixation was normal.  Free light chain ratio is 1.32.  Kappa light chains are elevated at 35. -She is receiving antibiotics with IV ceftriaxone for osteomyelitis of the mandible until 04/03/2020. -We will continue to hold her treatment until then.  I have told her to hold off on taking Revlimid. -She will come back in 2 weeks for follow-up.  2.  Osteomyelitis of the mandible/dental abscess: -She will continue ceftriaxone IV daily via PICC line at home.  This is done under the direction of Dr. Linus Salmons.  She will receive antibiotic until 04/03/2020.  3.  Severe hypokalemia: -She reports that she has been taking potassium 40 mEq 3 times a day.  She is reportedly crushing the tablets.  I have told her to break them into half and take it with applesauce. -Today potassium is 2.7.  She will receive 100 mEq of both IV and p.o. potassium.  4.  Normocytic anemia: -Hemoglobin today 7.8. -We will give her Retacrit and recheck labs in a week.

## 2020-03-20 NOTE — Patient Instructions (Signed)
[place] at [facility] Discharge Instructions  Received P[otassium infusions and Retacrit and Vit B12 injections today. Follow-up as scheduled. Call clinic for any questions or concerns   Thank you for choosing [place] at [facility] to provide your oncology and hematology care.  To afford each patient quality time with our provider, please arrive at least 15 minutes before your scheduled appointment time.   If you have a lab appointment with the [facility] please come in thru the Main Entrance and check in at the main information desk.  You need to re-schedule your appointment should you arrive 10 or more minutes late.  We strive to give you quality time with our providers, and arriving late affects you and other patients whose appointments are after yours.  Also, if you no show three or more times for appointments you may be dismissed from the clinic at the providers discretion.     Again, thank you for choosing [facility].  Our hope is that these requests will decrease the amount of time that you wait before being seen by our physicians.       _____________________________________________________________  Should you have questions after your visit to [facility], please contact our office at [phone] between the hours of 8:00 a.m. and 4:30 p.m.  Voicemails left after 4:00 p.m. will not be returned until the following business day.  For prescription refill requests, have your pharmacy contact our office and allow 72 hours.    Due to Covid, you will need to wear a mask upon entering the hospital. If you do not have a mask, a mask will be given to you at the Main Entrance upon arrival. For doctor visits, patients may have 1 support person with them. For treatment visits, patients can not have anyone with them due to social distancing guidelines and our immunocompromised population.

## 2020-03-20 NOTE — Progress Notes (Signed)
Labs reviewed with and pt seen by Dr. Delton Coombes and Velcade infusion and Delton See injection to be held today with Potassium 20 meq IV and 40 meq PO pre and post to be given for K+ of 2.7 as well as Retacrit and Vit B12 injections today per MD.                Brittany Archer tolerated Potassium infusions and Retacrit and Vit B12 injections well without complaints or incident.Hgb 7.8 today. VSS upon discharge. Peripheral IV site checked with positive blood return noted prior to and after infusions. Pt discharged self ambulatory in satisfactory condition accompanied by family member

## 2020-03-20 NOTE — Progress Notes (Signed)
Brittany Archer, [place] [address]   CLINIC:  Medical Oncology/Hematology  PCP:  Vesta Mixer [address] [place] [address] [phone]   REASON FOR VISIT:  Follow-up for IgA Kappa Multiple Myeloma   CURRENT THERAPY: RVD  BRIEF ONCOLOGIC HISTORY:  Oncology History  Multiple myeloma not having achieved remission ([place])  01/20/2018 Initial Diagnosis   Multiple myeloma not having achieved remission ([place])   01/26/2018 -  Chemotherapy   The patient had bortezomib SQ (VELCADE) chemo injection 2.5 mg, 1.3 mg/m2 = 2.5 mg, Subcutaneous,  Once, 27 of 29 cycles Administration: 2.5 mg (01/26/2018), 2.5 mg (02/02/2018), 2.5 mg (02/09/2018), 2.5 mg (02/16/2018), 2.5 mg (02/23/2018), 2.5 mg (03/02/2018), 2.5 mg (03/09/2018), 2.5 mg (03/30/2018), 2.5 mg (04/06/2018), 2.5 mg (04/13/2018), 2.5 mg (04/21/2018), 2.5 mg (05/06/2018), 2.5 mg (05/11/2018), 2.5 mg (05/20/2018), 2.5 mg (05/27/2018), 2.5 mg (06/10/2018), 2.5 mg (06/17/2018), 2.5 mg (06/24/2018), 2.5 mg (07/08/2018), 2.5 mg (07/15/2018), 2.5 mg (07/22/2018), 2.5 mg (08/05/2018), 2.5 mg (08/12/2018), 2.5 mg (09/02/2018), 2.5 mg (09/09/2018), 2.5 mg (09/16/2018), 2.5 mg (09/30/2018), 2.5 mg (10/07/2018), 2.5 mg (10/14/2018), 2.5 mg (10/31/2018), 2.5 mg (11/07/2018), 2.5 mg (11/14/2018), 2.5 mg (11/28/2018), 2.25 mg (12/05/2018), 2.25 mg (12/12/2018), 2.25 mg (12/26/2018), 2.25 mg (01/02/2019), 2.25 mg (01/09/2019), 2.25 mg (01/23/2019), 2.25 mg (01/30/2019), 2.25 mg (02/06/2019), 2.25 mg (02/20/2019), 2.25 mg (02/27/2019), 2.25 mg (03/06/2019), 2.25 mg (03/20/2019), 2.25 mg (03/27/2019), 2.25 mg (04/03/2019), 2.25 mg (04/17/2019), 2.25 mg (04/24/2019), 2.25 mg (05/01/2019), 2.25 mg (05/15/2019), 2.25 mg (05/23/2019), 2.25 mg (05/30/2019), 2.25 mg (06/13/2019), 2.25 mg (06/20/2019), 2.25 mg (06/27/2019), 2.25 mg (07/11/2019), 2.25 mg (07/18/2019), 2.25 mg (07/25/2019), 2.25 mg (08/08/2019), 2.25 mg (08/15/2019), 2.25 mg (08/22/2019), 2.25 mg (09/05/2019), 2.25 mg  (09/12/2019), 2.25 mg (09/19/2019), 2.25 mg (10/03/2019), 2.25 mg (10/10/2019), 2.25 mg (10/17/2019), 2.25 mg (10/31/2019), 2.25 mg (11/07/2019), 2.25 mg (11/14/2019), 2.25 mg (11/29/2019), 2.25 mg (12/06/2019), 2.25 mg (12/13/2019), 2.25 mg (01/03/2020), 2.25 mg (01/10/2020), 2.25 mg (01/17/2020), 2.25 mg (01/31/2020), 2.25 mg (02/07/2020), 2.25 mg (02/14/2020)  for chemotherapy treatment.         INTERVAL HISTORY:  Brittany Archer 79 y.o. female seen for follow-up of multiple myeloma and toxicity assessment prior to start of next cycle of treatment.  She reports that she has been taking potassium 3 times a day.  She is receiving IV antibiotics at home with ceftriaxone via PICC line.  Reports appetite and energy levels are 75%.  Denies any bleeding per rectum or melena.  Reports improvement in swelling and no facial pain.  Denies any fevers or chills.  REVIEW OF SYSTEMS:  Review of Systems  All other systems reviewed and are negative.    PAST MEDICAL/SURGICAL HISTORY:  Past Medical History:  Diagnosis Date  . Breast cancer ([place])    left breast/ 2008/ surg/ rad tx  . Coronary artery disease   . Diabetes mellitus    Past Surgical History:  Procedure Laterality Date  . ABDOMINAL HYSTERECTOMY    . BREAST SURGERY    . DEBRIDEMENT MANDIBLE N/A 02/22/2020   Procedure: INCISION AND DRAINAGE WITH DEBRIDEMENT MANDIBLE;  Surgeon: Michael Litter, DMD;  Location: [facility];  Service: Oral Surgery;  Laterality: N/A;  . TOOTH EXTRACTION N/A 02/22/2020   Procedure: DENTAL RESTORATION/EXTRACTIONS;  Surgeon: Michael Litter, DMD;  Location: [facility];  Service: Oral Surgery;  Laterality: N/A;  DENTAL KIT REQUESTED     SOCIAL HISTORY:  Social History   Socioeconomic History  . Marital status: Divorced  Spouse name: Not on file  . Number of children: Not on file  . Years of education: Not on file  . Highest education level: Not on file  Occupational History  . Not on file  Tobacco Use  . Smoking status: Never  Smoker  . Smokeless tobacco: Never Used  Substance and Sexual Activity  . Alcohol use: No  . Drug use: No  . Sexual activity: Yes    Birth control/protection: Surgical  Other Topics Concern  . Not on file  Social History Narrative  . Not on file   Social Determinants of Health   Financial Resource Strain:   . Difficulty of Paying Living Expenses:   Food Insecurity:   . Worried About Charity fundraiser in the Last Year:   . Arboriculturist in the Last Year:   Transportation Needs:   . Film/video editor (Medical):   Marland Kitchen Lack of Transportation (Non-Medical):   Physical Activity:   . Days of Exercise per Week:   . Minutes of Exercise per Session:   Stress:   . Feeling of Stress :   Social Connections:   . Frequency of Communication with Friends and Family:   . Frequency of Social Gatherings with Friends and Family:   . Attends Religious Services:   . Active Member of Clubs or Organizations:   . Attends Archivist Meetings:   Marland Kitchen Marital Status:   Intimate Partner Violence:   . Fear of Current or Ex-Partner:   . Emotionally Abused:   Marland Kitchen Physically Abused:   . Sexually Abused:     FAMILY HISTORY:  Family History  Problem Relation Age of Onset  . Obesity Sister     CURRENT MEDICATIONS:  Outpatient Encounter Medications as of 03/20/2020  Medication Sig Note  . acyclovir (ZOVIRAX) 400 MG tablet TAKE 1 TABLET BY MOUTH TWICE DAILY (Patient taking differently: Take 400 mg by mouth 2 (two) times daily. )   . aspirin 81 MG tablet Take 81 mg by mouth daily.     . bortezomib IV (VELCADE) 3.5 MG injection Inject 3.5 mg into the vein once a week. weekly    . cefTRIAXone (ROCEPHIN) IVPB Inject 2 g into the vein daily. Indication:  OM Last Day of Therapy:  04/03/20 Labs - Once weekly:  CBC/D and BMP, Labs - Every other week:  ESR and CRP   . dexamethasone (DECADRON) 4 MG tablet Take 10 tablets (40 mg) on days 1, 8, and 15 of chemo. Repeat every 21 days. 02/19/2020: Due to  take on 02/25/2020  . glipiZIDE (GLUCOTROL) 5 MG tablet Take 5 mg by mouth daily before breakfast.    . lenalidomide (REVLIMID) 20 MG capsule Take 1 capsule by mouth once daily for 14 days on, and 7 days off of a 21 day cycle.   Marland Kitchen lisinopril-hydrochlorothiazide (PRINZIDE,ZESTORETIC) 20-25 MG tablet Take 1 tablet by mouth every morning.    . magnesium oxide (MAG-OX) 400 (241.3 Mg) MG tablet Take 1 tablet (400 mg total) by mouth 2 (two) times daily.   . metFORMIN (GLUCOPHAGE) 1000 MG tablet Take 1,000 mg by mouth 2 times daily at 12 noon and 4 pm.     . metroNIDAZOLE (FLAGYL) 500 MG tablet Take 1 tablet (500 mg total) by mouth 3 (three) times daily.   . potassium chloride SA (KLOR-CON) 20 MEQ tablet Take 2 tablets (40 mEq total) by mouth 3 (three) times daily.    No facility-administered encounter medications on file  as of 03/20/2020.    ALLERGIES:  Allergies  Allergen Reactions  . Motrin [Ibuprofen] Rash     PHYSICAL EXAM:  ECOG Performance status: 1  Vitals:   03/20/20 0901  BP: (!) 123/53  Pulse: 68  Resp: 18  Temp: (!) 96.9 F (36.1 C)  SpO2: 100%   Filed Weights   03/20/20 0901  Weight: 146 lb 3.2 oz (66.3 kg)    Physical Exam Vitals reviewed.  Constitutional:      Appearance: Normal appearance. She is obese.  HENT:     Head: Normocephalic.     Nose: Nose normal.     Mouth/Throat:     Mouth: Mucous membranes are moist.     Pharynx: Oropharynx is clear.  Eyes:     Extraocular Movements: Extraocular movements intact.     Conjunctiva/sclera: Conjunctivae normal.  Cardiovascular:     Rate and Rhythm: Normal rate and regular rhythm.     Pulses: Normal pulses.     Heart sounds: Normal heart sounds.  Pulmonary:     Effort: Pulmonary effort is normal.     Breath sounds: Normal breath sounds.  Abdominal:     General: Bowel sounds are normal.     Palpations: Abdomen is soft.  Musculoskeletal:        General: Normal range of motion.     Cervical back: Normal range  of motion.  Skin:    General: Skin is warm and dry.  Neurological:     General: No focal deficit present.     Mental Status: She is alert and oriented to person, place, and time. Mental status is at baseline.  Psychiatric:        Mood and Affect: Mood normal.        Behavior: Behavior normal.        Thought Content: Thought content normal.        Judgment: Judgment normal.      LABORATORY DATA:  I have reviewed the labs as listed.  CBC    Component Value Date/Time   WBC 3.1 (L) 03/20/2020 0820   RBC 2.49 (L) 03/20/2020 0820   HGB 7.8 (L) 03/20/2020 0820   HCT 24.7 (L) 03/20/2020 0820   PLT 140 (L) 03/20/2020 0820   MCV 99.2 03/20/2020 0820   MCH 31.3 03/20/2020 0820   MCHC 31.6 03/20/2020 0820   RDW 18.8 (H) 03/20/2020 0820   LYMPHSABS 0.9 03/20/2020 0820   MONOABS 0.3 03/20/2020 0820   EOSABS 0.1 03/20/2020 0820   BASOSABS 0.0 03/20/2020 0820   CMP Latest Ref Rng & Units 03/20/2020 02/28/2020 02/27/2020  Glucose 70 - 99 mg/dL 81 219(H) 123(H)  BUN 8 - 23 mg/dL 5(L) 14 9  Creatinine 0.44 - 1.00 mg/dL 0.75 1.10(H) 0.96  Sodium 135 - 145 mmol/L 141 137 138  Potassium 3.5 - 5.1 mmol/L 2.7(LL) 3.4(L) 3.5  Chloride 98 - 111 mmol/L 112(H) 99 100  CO2 22 - 32 mmol/L '22 27 26  ' Calcium 8.9 - 10.3 mg/dL 7.9(L) 10.2 10.2  Total Protein 6.5 - 8.1 g/dL 5.9(L) 6.5 6.3(L)  Total Bilirubin 0.3 - 1.2 mg/dL 0.7 0.5 0.6  Alkaline Phos 38 - 126 U/L 51 69 67  AST 15 - 41 U/L 13(L) 18 18  ALT 0 - 44 U/L '8 13 12      ' I have independently reviewed the scans.   ASSESSMENT & PLAN:   Multiple myeloma not having achieved remission ([place]) 1.  IgA kappa plasma cell myeloma, stage I,  status: -RVD regimen started on 01/26/2018. -Reviewed myeloma panel from 02/28/2020.  SPEP shows M spike not detected.  Immunofixation was normal.  Free light chain ratio is 1.32.  Kappa light chains are elevated at 35. -She is receiving antibiotics with IV ceftriaxone for osteomyelitis of the mandible until  04/03/2020. -We will continue to hold her treatment until then.  I have told her to hold off on taking Revlimid. -She will come back in 2 weeks for follow-up.  2.  Osteomyelitis of the mandible/dental abscess: -She will continue ceftriaxone IV daily via PICC line at home.  This is done under the direction of Dr. Linus Salmons.  She will receive antibiotic until 04/03/2020.  3.  Severe hypokalemia: -She reports that she has been taking potassium 40 mEq 3 times a day.  She is reportedly crushing the tablets.  I have told her to break them into half and take it with applesauce. -Today potassium is 2.7.  She will receive 100 mEq of both IV and p.o. potassium.  4.  Normocytic anemia: -Hemoglobin today 7.8. -We will give her Retacrit and recheck labs in a week.   Orders placed this encounter:  No orders of the defined types were placed in this encounter.     Derek Jack, MD  [place] [phone]

## 2020-03-20 NOTE — Telephone Encounter (Signed)
Spoke with Brittany Archer home health RN, regarding PICC replacement. Per Brittany Archer is not living at her home right now, does not have her phone with her. Best point of contact is her brother Brittany Archer (before 11am - [phone]) or sister Brittany Archer (after 11am [phone]). RN spoke with [facility] who states the PICC is still working.  They will take her to the ED if her PICC comes out any more or if they have difficulty with antibiotic infusion. Landis Gandy, RN

## 2020-03-20 NOTE — Progress Notes (Signed)
CRITICAL VALUE ALERT  Critical Value:  K+ 2.7  Date & Time Notied:  03/20/2020 at [place]  Provider Notified: Dr. Delton Coombes  Orders Received/Actions taken: give KCl 20 mEq IV and K-Dur 40 mEq po pre and post IV KCl.

## 2020-03-20 NOTE — Progress Notes (Signed)
Patient has been assessed, vital signs and labs have been reviewed by Dr. Delton Coombes. He will HOLD Tx today. Pt to restart weekly retacrit injections with labs today. She will return in 3 weeks for labs and Tx.

## 2020-03-20 NOTE — Patient Instructions (Addendum)
[place] at [facility] Discharge Instructions  You were seen today by Dr. Delton Coombes. He went over your recent lab results. Try breaking the potassium tablets in half instead of crushing it. He will see you back in 3 weeks for labs,treatment and follow up.   Thank you for choosing [facility] [facility] to provide your oncology and hematology care.  To afford each patient quality time with our provider, please arrive at least 15 minutes before your scheduled appointment time.   If you have a lab appointment with the [facility] please come in thru the  Main Entrance and check in at the main information desk  You need to re-schedule your appointment should you arrive 10 or more minutes late.  We strive to give you quality time with our providers, and arriving late affects you and other patients whose appointments are after yours.  Also, if you no show three or more times for appointments you may be dismissed from the clinic at the providers discretion.     Again, thank you for choosing [facility].  Our hope is that these requests will decrease the amount of time that you wait before being seen by our physicians.       _____________________________________________________________  Should you have questions after your visit to [facility], please contact our office at [phone] between the hours of 8:00 a.m. and 4:30 p.m.  Voicemails left after 4:00 p.m. will not be returned until the following business day.  For prescription refill requests, have your pharmacy contact our office and allow 72 hours.    Cancer Center Support Programs:   > Cancer Support Group  2nd Tuesday of the month 1pm-2pm, Journey Room

## 2020-03-21 LAB — KAPPA/LAMBDA LIGHT CHAINS
Kappa free light chain: 18.6 mg/L (ref 3.3–19.4)
Kappa, lambda light chain ratio: 1.39 (ref 0.26–1.65)
Lambda free light chains: 13.4 mg/L (ref 5.7–26.3)

## 2020-03-22 LAB — PROTEIN ELECTROPHORESIS, SERUM
A/G Ratio: 1.2 (ref 0.7–1.7)
Albumin ELP: 3.1 g/dL (ref 2.9–4.4)
Alpha-1-Globulin: 0.2 g/dL (ref 0.0–0.4)
Alpha-2-Globulin: 0.7 g/dL (ref 0.4–1.0)
Beta Globulin: 0.9 g/dL (ref 0.7–1.3)
Gamma Globulin: 0.8 g/dL (ref 0.4–1.8)
Globulin, Total: 2.6 g/dL (ref 2.2–3.9)
M-Spike, %: 0.3 g/dL — ABNORMAL HIGH
Total Protein ELP: 5.7 g/dL — ABNORMAL LOW (ref 6.0–8.5)

## 2020-03-22 LAB — IMMUNOFIXATION ELECTROPHORESIS
IgA: 187 mg/dL (ref 64–422)
IgG (Immunoglobin G), Serum: 810 mg/dL (ref 586–1602)
IgM (Immunoglobulin M), Srm: 96 mg/dL (ref 26–217)
Total Protein ELP: 5.6 g/dL — ABNORMAL LOW (ref 6.0–8.5)

## 2020-03-25 ENCOUNTER — Observation Stay (HOSPITAL_COMMUNITY)
Admission: EM | Admit: 2020-03-25 | Discharge: 2020-03-26 | Disposition: A | Discharge status: Home / Self Care | Payer: Medicare Other | Attending: Internal Medicine | Admitting: Internal Medicine

## 2020-03-25 ENCOUNTER — Encounter (HOSPITAL_COMMUNITY): Payer: Self-pay

## 2020-03-25 ENCOUNTER — Telehealth: Payer: Self-pay | Admitting: *Deleted

## 2020-03-25 ENCOUNTER — Other Ambulatory Visit: Payer: Self-pay

## 2020-03-25 ENCOUNTER — Emergency Department (HOSPITAL_COMMUNITY): Payer: Medicare Other

## 2020-03-25 DIAGNOSIS — Z7984 Long term (current) use of oral hypoglycemic drugs: Secondary | ICD-10-CM | POA: Diagnosis not present

## 2020-03-25 DIAGNOSIS — C9000 Multiple myeloma not having achieved remission: Secondary | ICD-10-CM | POA: Diagnosis present

## 2020-03-25 DIAGNOSIS — I129 Hypertensive chronic kidney disease with stage 1 through stage 4 chronic kidney disease, or unspecified chronic kidney disease: Secondary | ICD-10-CM | POA: Diagnosis not present

## 2020-03-25 DIAGNOSIS — R001 Bradycardia, unspecified: Secondary | ICD-10-CM | POA: Insufficient documentation

## 2020-03-25 DIAGNOSIS — I10 Essential (primary) hypertension: Secondary | ICD-10-CM | POA: Diagnosis not present

## 2020-03-25 DIAGNOSIS — Z20822 Contact with and (suspected) exposure to covid-19: Secondary | ICD-10-CM | POA: Insufficient documentation

## 2020-03-25 DIAGNOSIS — Z90710 Acquired absence of both cervix and uterus: Secondary | ICD-10-CM | POA: Insufficient documentation

## 2020-03-25 DIAGNOSIS — M272 Inflammatory conditions of jaws: Secondary | ICD-10-CM | POA: Insufficient documentation

## 2020-03-25 DIAGNOSIS — T82898A Other specified complication of vascular prosthetic devices, implants and grafts, initial encounter: Secondary | ICD-10-CM | POA: Diagnosis not present

## 2020-03-25 DIAGNOSIS — Z853 Personal history of malignant neoplasm of breast: Secondary | ICD-10-CM | POA: Diagnosis not present

## 2020-03-25 DIAGNOSIS — Z8349 Family history of other endocrine, nutritional and metabolic diseases: Secondary | ICD-10-CM | POA: Diagnosis not present

## 2020-03-25 DIAGNOSIS — E119 Type 2 diabetes mellitus without complications: Secondary | ICD-10-CM

## 2020-03-25 DIAGNOSIS — E1122 Type 2 diabetes mellitus with diabetic chronic kidney disease: Secondary | ICD-10-CM | POA: Diagnosis not present

## 2020-03-25 DIAGNOSIS — E876 Hypokalemia: Secondary | ICD-10-CM | POA: Diagnosis present

## 2020-03-25 DIAGNOSIS — Z886 Allergy status to analgesic agent status: Secondary | ICD-10-CM | POA: Insufficient documentation

## 2020-03-25 DIAGNOSIS — Z79899 Other long term (current) drug therapy: Secondary | ICD-10-CM | POA: Diagnosis not present

## 2020-03-25 DIAGNOSIS — Z7982 Long term (current) use of aspirin: Secondary | ICD-10-CM | POA: Diagnosis not present

## 2020-03-25 DIAGNOSIS — Y9389 Activity, other specified: Secondary | ICD-10-CM | POA: Diagnosis not present

## 2020-03-25 DIAGNOSIS — N1832 Chronic kidney disease, stage 3b: Secondary | ICD-10-CM | POA: Diagnosis not present

## 2020-03-25 DIAGNOSIS — Z452 Encounter for adjustment and management of vascular access device: Secondary | ICD-10-CM | POA: Diagnosis not present

## 2020-03-25 DIAGNOSIS — X58XXXA Exposure to other specified factors, initial encounter: Secondary | ICD-10-CM | POA: Diagnosis not present

## 2020-03-25 DIAGNOSIS — I2510 Atherosclerotic heart disease of native coronary artery without angina pectoris: Secondary | ICD-10-CM | POA: Insufficient documentation

## 2020-03-25 DIAGNOSIS — Z4589 Encounter for adjustment and management of other implanted devices: Secondary | ICD-10-CM

## 2020-03-25 DIAGNOSIS — M879 Osteonecrosis, unspecified: Secondary | ICD-10-CM

## 2020-03-25 DIAGNOSIS — E11628 Type 2 diabetes mellitus with other skin complications: Secondary | ICD-10-CM

## 2020-03-25 LAB — CBC WITH DIFFERENTIAL/PLATELET
Abs Immature Granulocytes: 0.01 10*3/uL (ref 0.00–0.07)
Basophils Absolute: 0 10*3/uL (ref 0.0–0.1)
Basophils Relative: 1 %
Eosinophils Absolute: 0 10*3/uL (ref 0.0–0.5)
Eosinophils Relative: 1 %
HCT: 27.4 % — ABNORMAL LOW (ref 36.0–46.0)
Hemoglobin: 8.6 g/dL — ABNORMAL LOW (ref 12.0–15.0)
Immature Granulocytes: 0 %
Lymphocytes Relative: 35 %
Lymphs Abs: 1.5 10*3/uL (ref 0.7–4.0)
MCH: 31.6 pg (ref 26.0–34.0)
MCHC: 31.4 g/dL (ref 30.0–36.0)
MCV: 100.7 fL — ABNORMAL HIGH (ref 80.0–100.0)
Monocytes Absolute: 0.4 10*3/uL (ref 0.1–1.0)
Monocytes Relative: 9 %
Neutro Abs: 2.3 10*3/uL (ref 1.7–7.7)
Neutrophils Relative %: 54 %
Platelets: 183 10*3/uL (ref 150–400)
RBC: 2.72 MIL/uL — ABNORMAL LOW (ref 3.87–5.11)
RDW: 19.9 % — ABNORMAL HIGH (ref 11.5–15.5)
WBC: 4.3 10*3/uL (ref 4.0–10.5)
nRBC: 0.5 % — ABNORMAL HIGH (ref 0.0–0.2)

## 2020-03-25 LAB — COMPREHENSIVE METABOLIC PANEL
ALT: 8 U/L (ref 0–44)
AST: 12 U/L — ABNORMAL LOW (ref 15–41)
Albumin: 3.3 g/dL — ABNORMAL LOW (ref 3.5–5.0)
Alkaline Phosphatase: 54 U/L (ref 38–126)
Anion gap: 8 (ref 5–15)
BUN: <5 mg/dL — ABNORMAL LOW (ref 8–23)
CO2: 23 mmol/L (ref 22–32)
Calcium: 8.4 mg/dL — ABNORMAL LOW (ref 8.9–10.3)
Chloride: 111 mmol/L (ref 98–111)
Creatinine, Ser: 0.7 mg/dL (ref 0.44–1.00)
GFR calc Af Amer: >60 mL/min (ref >60)
GFR calc non Af Amer: >60 mL/min (ref >60)
Glucose, Bld: 63 mg/dL — ABNORMAL LOW (ref 70–99)
Potassium: 2.9 mmol/L — ABNORMAL LOW (ref 3.5–5.1)
Sodium: 142 mmol/L (ref 135–145)
Total Bilirubin: 0.4 mg/dL (ref 0.3–1.2)
Total Protein: 6.2 g/dL — ABNORMAL LOW (ref 6.5–8.1)

## 2020-03-25 LAB — PROTIME-INR
INR: 1.1 (ref 0.8–1.2)
Prothrombin Time: 13.7 seconds (ref 11.4–15.2)

## 2020-03-25 LAB — RESPIRATORY PANEL BY RT PCR (FLU A&B, COVID)
Influenza A by PCR: NEGATIVE
Influenza B by PCR: NEGATIVE
SARS Coronavirus 2 by RT PCR: NEGATIVE

## 2020-03-25 LAB — MAGNESIUM: Magnesium: 1.5 mg/dL — ABNORMAL LOW (ref 1.7–2.4)

## 2020-03-25 MED ORDER — DEXTROSE 50 % IV SOLN
25.0000 mL | Freq: Once | INTRAVENOUS | Status: AC
  Administered 2020-03-25: 25 mL via INTRAVENOUS
  Filled 2020-03-25: qty 50

## 2020-03-25 MED ORDER — SODIUM CHLORIDE 0.9 % IV SOLN
2.0000 g | INTRAVENOUS | Status: DC
  Administered 2020-03-26: 2 g via INTRAVENOUS
  Filled 2020-03-25: qty 20

## 2020-03-25 MED ORDER — POTASSIUM CHLORIDE CRYS ER 20 MEQ PO TBCR
40.0000 meq | EXTENDED_RELEASE_TABLET | Freq: Once | ORAL | Status: AC
  Administered 2020-03-25: 40 meq via ORAL
  Filled 2020-03-25: qty 2

## 2020-03-25 NOTE — ED Triage Notes (Signed)
Patient states a home health nurse came to her house today and then told her to come to the ED . Patient is unable to state what is going on with her right upper arm PICC line/ PICC line is still in the right upper arm.

## 2020-03-25 NOTE — Telephone Encounter (Signed)
Diane, home health nurse from [facility], called to report that the patient has not yet gotten her PICC replaced and it is now pulled out further.  Homehealth nurse will draw labs via peripheral stick and will send patient to ER for PICC replacement, as the family was not successful in setting up PICC exchange with IR. ESR, CRP being drawn today with CBC, BMP. End date for IV ceftriaxone is 4/7. Landis Gandy, RN

## 2020-03-25 NOTE — ED Notes (Signed)
Called lab to add magnesium. 

## 2020-03-25 NOTE — Progress Notes (Signed)
In Progress  03/25/20 8:14 PM [place], Brittany Roller, RN  IV consulted to remove R PICC line. CXR showed the tip to be in the R subclavian. PICC removed. Site CD&I. Vaseline gauze and 2x2 applied to the site. No questions or concerns from patient or family at this time.

## 2020-03-25 NOTE — ED Notes (Signed)
Admitting provider at bedside.

## 2020-03-25 NOTE — ED Provider Notes (Addendum)
[facility] DEPT Provider Note   CSN: [id number] Arrival date & time: 03/25/20  1559     History Chief Complaint  Patient presents with  . picc line issue    Brittany Archer is a 79 y.o. female.  HPI    79 year old female comes in a chief complaint of PICC line issues. Patient has osteomyelitis and multiple myeloma.  She is getting antibiotics through her PICC line until April 7.  Home nurse advised patient to come to the ER as PICC line has moved.  Patient has no complaints from her side.  She reports that she did receive IV antibiotics today.  Past Medical History:  Diagnosis Date  . Breast cancer ([place])    left breast/ 2008/ surg/ rad tx  . Coronary artery disease   . Diabetes mellitus     Patient Active Problem List   Diagnosis Date Noted  . PICC (peripherally inserted central catheter) in place 03/18/2020  . Leukopenia 03/18/2020  . Osteomyelitis, jaw acute   . Osteonecrosis ([place])   . Confusion 02/20/2020  . Facial cellulitis 02/19/2020  . Type 2 diabetes mellitus ([place])   . Coronary artery disease   . Hypertension   . Stage 3b chronic kidney disease   . Hypomagnesemia   . Hypophosphatemia   . Hypokalemia 01/03/2020  . CKD (chronic kidney disease) 05/01/2019  . B12 deficiency 02/09/2018  . Multiple myeloma not having achieved remission ([place]) 01/20/2018  . Iron deficiency anemia 11/25/2017    Past Surgical History:  Procedure Laterality Date  . ABDOMINAL HYSTERECTOMY    . BREAST SURGERY    . DEBRIDEMENT MANDIBLE N/A 02/22/2020   Procedure: INCISION AND DRAINAGE WITH DEBRIDEMENT MANDIBLE;  Surgeon: Michael Litter, DMD;  Location: [facility];  Service: Oral Surgery;  Laterality: N/A;  . TOOTH EXTRACTION N/A 02/22/2020   Procedure: DENTAL RESTORATION/EXTRACTIONS;  Surgeon: Michael Litter, DMD;  Location: [facility];  Service: Oral Surgery;  Laterality: N/A;  DENTAL KIT REQUESTED     OB History   No obstetric history on file.      Family History  Problem Relation Age of Onset  . Obesity Sister     Social History   Tobacco Use  . Smoking status: Never Smoker  . Smokeless tobacco: Never Used  Substance Use Topics  . Alcohol use: No  . Drug use: No    Home Medications Prior to Admission medications   Medication Sig Start Date End Date Taking? Authorizing Provider  aspirin 81 MG tablet Take 81 mg by mouth daily.     Yes [provider]  glipiZIDE (GLUCOTROL) 5 MG tablet Take 5 mg by mouth daily before breakfast.  01/31/18  Yes [provider]  lenalidomide (REVLIMID) 20 MG capsule Take 1 capsule by mouth once daily for 14 days on, and 7 days off of a 21 day cycle. 03/01/20  Yes Derek Jack, MD  lisinopril-hydrochlorothiazide (PRINZIDE,ZESTORETIC) 20-25 MG tablet Take 1 tablet by mouth every morning.  01/12/18  Yes [provider]  magnesium oxide (MAG-OX) 400 (241.3 Mg) MG tablet Take 1 tablet (400 mg total) by mouth 2 (two) times daily. 02/27/20  Yes Pokhrel, Laxman, MD  metFORMIN (GLUCOPHAGE) 1000 MG tablet Take 1,000 mg by mouth 2 times daily at 12 noon and 4 pm.     Yes [provider]  potassium chloride SA (KLOR-CON) 20 MEQ tablet Take 2 tablets (40 mEq total) by mouth 3 (three) times daily. 01/31/20  Yes Derek Jack, MD  acyclovir (ZOVIRAX) 400 MG tablet TAKE 1 TABLET BY MOUTH TWICE DAILY Patient taking differently: Take 400 mg by mouth 2 (two) times daily.  01/31/20   Lockamy, Randi L, NP-C  bortezomib IV (VELCADE) 3.5 MG injection Inject 3.5 mg into the vein once a week. weekly     [provider]  cefTRIAXone (ROCEPHIN) IVPB Inject 2 g into the vein daily. Indication:  OM Last Day of Therapy:  04/03/20 Labs - Once weekly:  CBC/D and BMP, Labs - Every other week:  ESR and CRP 02/27/20 04/04/21  Pokhrel, Laxman, MD  dexamethasone (DECADRON) 4 MG tablet Take 10 tablets (40 mg) on days 1, 8, and 15 of chemo. Repeat every 21 days. 04/24/19   Derek Jack, MD  metroNIDAZOLE (FLAGYL) 500 MG tablet Take 1 tablet (500 mg total) by mouth 3 (three) times daily. 02/27/20 04/03/20  Pokhrel, Corrie Mckusick, MD    Allergies    Motrin [ibuprofen]  Review of Systems   Review of Systems  Constitutional: Negative for activity change, chills and fever.  Gastrointestinal: Negative for nausea and vomiting.  Skin: Negative for rash.  Hematological: Does not bruise/bleed easily.    Physical Exam Updated Vital Signs BP (!) 163/60   Pulse (!) 54   Temp 98.1 F (36.7 C) (Oral)   Resp 16   Ht '5\' 4"'  (1.626 m)   Wt 70.3 kg   SpO2 100%   BMI 26.61 kg/m   Physical Exam Vitals and nursing note reviewed.  Constitutional:      Appearance: She is well-developed.  HENT:     Head: Atraumatic.  Cardiovascular:     Rate and Rhythm: Normal rate.  Pulmonary:     Effort: Pulmonary effort is normal.  Musculoskeletal:     Cervical back: Normal range of motion and neck supple.     Comments: Right upper extremity PICC line, with dressing on it.  Skin:    General: Skin is warm.  Neurological:     Mental Status: She is alert and oriented to person, place, and time.     ED Results / Procedures / Treatments   Labs (all labs ordered are listed, but only abnormal results are displayed) Labs Reviewed  SARS CORONAVIRUS 2 (TAT 6-24 HRS)  RESPIRATORY PANEL BY RT PCR (FLU A&B, COVID)  CBC WITH DIFFERENTIAL/PLATELET  COMPREHENSIVE METABOLIC PANEL  PROTIME-INR    EKG None  Radiology DG Chest Port 1 View  Result Date: 03/25/2020 CLINICAL DATA:  PICC placement EXAM: PORTABLE CHEST 1 VIEW COMPARISON:  08/15/2007 FINDINGS: Right upper extremity central venous catheter tip projects over the subclavian region. No focal opacity or pleural effusion. Normal heart size. No pneumothorax. IMPRESSION: Right upper extremity central venous catheter tip projects over the subclavian region. Electronically Signed   By: Donavan Foil M.D.   On: 03/25/2020 19:10     Procedures Procedures (including critical care time)  Medications Ordered in ED Medications - No data to display  ED Course  I have reviewed the triage vital signs and the nursing notes.  Pertinent labs & imaging results that were available during my care of the patient were reviewed by me and considered in my medical decision making (see chart for details).    MDM Rules/Calculators/A&P                      79 year old comes in a chief complaint of PICC line issues.  X-ray indicates that patient's PICC line has indeed moved and the tip  is projecting over the subclavian region.  She will need a PICC replacement.  IR consulted.  9:52 PM Unable to schedule picc. Will need admission.  Final Clinical Impression(s) / ED Diagnoses Final diagnoses:  Encounter for management of peripherally inserted central catheter (PICC)    Rx / DC Orders ED Discharge Orders    None       Varney Biles, MD 03/25/20 2148    Varney Biles, MD 03/25/20 2152

## 2020-03-25 NOTE — H&P (Signed)
History and Physical    Brittany Archer SHF:[id number] DOB: 11-06-1941 DOA: 03/25/2020  PCP: [name], PA-C  Patient coming from: Home, lives with son  I have personally briefly reviewed patient's old medical records in [place]  Chief Complaint: Displaced PICC line  HPI: Brittany Archer is a 79 y.o. 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   Most history obtained from documentation and ED physician report since pt had poor recall. Patient has been receiving IV Rocephin for osteomyelitis of the right jaw through PICC line at home.  Today home health nurse has noted that the PICC line was pulled out further and advised her to present to the ED for exchange.  Chest x-ray shows right upper extremity central venous catheter tip projects over the subclavian region.  Patient unsure if she received her dose of antibiotics today but per ED physician report it was given prior to her admission to the ED.  Attempts were made to arrange for VIR and PICC team to place line but this will not be able to be done until morning so hospitalist was consulted for admission.  ED Course: Patient was bradycardic down to the 50s but denies any chest pain shortness of breath or dizziness.  She had notable hypokalemia of 2.9 and EKG is pending.  Review of Systems:  Constitutional: No Weight Change, No Fever ENT/Mouth: No sore throat, No Rhinorrhea Eyes: No Eye Pain, No Vision Changes Cardiovascular: No Chest Pain, no SOB Respiratory: No Cough, No Sputum, Gastrointestinal: No Nausea, No Vomiting, No Diarrhea, No Constipation, No Pain Genitourinary: no Urinary Incontinence Musculoskeletal: No Arthralgias, No Myalgias Skin: No Skin Lesions, No Pruritus, Neuro: no Weakness,  No Numbness Psych: No Anxiety/Panic, No Depression, no decrease appetite Heme/Lymph: No Bruising, No Bleeding Past Medical History:  Diagnosis Date  . Breast cancer ([place])    left breast/ 2008/ surg/ rad tx  . Coronary artery disease   . Diabetes mellitus     Past Surgical History:  Procedure Laterality Date  . ABDOMINAL HYSTERECTOMY    . BREAST SURGERY    . DEBRIDEMENT MANDIBLE N/A 02/22/2020   Procedure: INCISION AND DRAINAGE WITH DEBRIDEMENT MANDIBLE;  Surgeon: Michael Litter, DMD;  Location: [facility];  Service: Oral Surgery;  Laterality: N/A;  . TOOTH EXTRACTION N/A 02/22/2020   Procedure: DENTAL RESTORATION/EXTRACTIONS;  Surgeon: Michael Litter, DMD;  Location: [facility];  Service: Oral Surgery;  Laterality: N/A;  DENTAL KIT REQUESTED     reports that she has never smoked. She has never used smokeless tobacco. She reports that she does not drink alcohol or use drugs.  Allergies  Allergen Reactions  . Motrin [Ibuprofen] Rash    Family History  Problem Relation Age of Onset  . Obesity Sister      Prior to Admission medications   Medication Sig Start Date End Date Taking? Authorizing Provider  acyclovir (ZOVIRAX) 400 MG tablet TAKE 1 TABLET BY MOUTH TWICE DAILY Patient taking differently: Take 400 mg by mouth 2 (two) times daily.  01/31/20  Yes Lockamy, Randi L, NP-C  aspirin 81 MG tablet Take 81 mg by mouth daily.     Yes [provider]  cefTRIAXone (ROCEPHIN) IVPB Inject 2 g into the vein daily. Indication:  OM Last Day of Therapy:  04/03/20 Labs -  Once weekly:  CBC/D and BMP, Labs - Every other week:  ESR and CRP 02/27/20 04/04/21 Yes Pokhrel, Laxman, MD  glipiZIDE (GLUCOTROL) 5 MG tablet Take 5 mg by mouth daily before breakfast.  01/31/18  Yes [provider]  lenalidomide (REVLIMID) 20 MG capsule Take 1 capsule by mouth once daily for 14 days on, and 7 days off of a 21 day cycle. 03/01/20  Yes Derek Jack, MD  lisinopril-hydrochlorothiazide  (PRINZIDE,ZESTORETIC) 20-25 MG tablet Take 1 tablet by mouth every morning.  01/12/18  Yes [provider]  magnesium oxide (MAG-OX) 400 (241.3 Mg) MG tablet Take 1 tablet (400 mg total) by mouth 2 (two) times daily. 02/27/20  Yes Pokhrel, Laxman, MD  metFORMIN (GLUCOPHAGE) 1000 MG tablet Take 1,000 mg by mouth 2 times daily at 12 noon and 4 pm.     Yes [provider]  metroNIDAZOLE (FLAGYL) 500 MG tablet Take 1 tablet (500 mg total) by mouth 3 (three) times daily. 02/27/20 04/03/20 Yes Pokhrel, Laxman, MD  potassium chloride SA (KLOR-CON) 20 MEQ tablet Take 2 tablets (40 mEq total) by mouth 3 (three) times daily. 01/31/20  Yes Derek Jack, MD  bortezomib IV (VELCADE) 3.5 MG injection Inject 3.5 mg into the vein once a week. weekly     [provider]  dexamethasone (DECADRON) 4 MG tablet Take 10 tablets (40 mg) on days 1, 8, and 15 of chemo. Repeat every 21 days. 04/24/19   Derek Jack, MD    Physical Exam: Vitals:   03/25/20 1907 03/25/20 2000 03/25/20 2100 03/25/20 2326  BP: (!) 158/71 (!) 163/60 (!) 163/65 (!) 158/78  Pulse: (!) 53 (!) 54 (!) 52 62  Resp: '16 16 18 16  ' Temp:      TempSrc:      SpO2: 100% 100% 100% 100%  Weight:      Height:        Constitutional: NAD, calm, comfortable, elderly female laying flat in bed Vitals:   03/25/20 1907 03/25/20 2000 03/25/20 2100 03/25/20 2326  BP: (!) 158/71 (!) 163/60 (!) 163/65 (!) 158/78  Pulse: (!) 53 (!) 54 (!) 52 62  Resp: '16 16 18 16  ' Temp:      TempSrc:      SpO2: 100% 100% 100% 100%  Weight:      Height:       Eyes: PERRL, lids and conjunctivae normal ENMT: Mucous membranes are moist. Edematous right jaw compare to left Neck: normal, supple Respiratory: clear to auscultation bilaterally, no wheezing, no crackles. Normal respiratory effort on room air. No accessory muscle use.  Cardiovascular: bradycardic, no murmurs / rubs / gallops. No extremity edema. .  Abdomen: no tenderness, no  masses palpated.  Bowel sounds positive.  Musculoskeletal: no clubbing / cyanosis. No joint deformity upper and lower extremities. Good ROM, no contractures. Normal muscle tone.  Skin: no rashes, lesions, ulcers. No induration Neurologic: CN 2-12 grossly intact. Sensation intact. Strength 5/5 in all 4.  Psychiatric: Normal judgment and insight. Alert and oriented x self, place but confused on year. Normal mood.     Labs on Admission: I have personally reviewed following labs and imaging studies  CBC: Recent Labs  Lab 03/20/20 0820 03/25/20 2152  WBC 3.1* 4.3  NEUTROABS 1.8 2.3  HGB 7.8* 8.6*  HCT 24.7* 27.4*  MCV 99.2 100.7*  PLT 140* 009   Basic Metabolic Panel: Recent Labs  Lab 03/20/20 0820 03/25/20 2152  NA 141 142  K 2.7* 2.9*  CL  112* 111  CO2 22 23  GLUCOSE 81 63*  BUN 5* <5*  CREATININE 0.75 0.70  CALCIUM 7.9* 8.4*  MG  --  1.5*   GFR: Estimated Creatinine Clearance: 54.8 mL/min (by C-G formula based on SCr of 0.7 mg/dL). Liver Function Tests: Recent Labs  Lab 03/20/20 0820 03/25/20 2152  AST 13* 12*  ALT 8 8  ALKPHOS 51 54  BILITOT 0.7 0.4  PROT 5.9* 6.2*  ALBUMIN 3.0* 3.3*   No results for input(s): LIPASE, AMYLASE in the last 168 hours. No results for input(s): AMMONIA in the last 168 hours. Coagulation Profile: Recent Labs  Lab 03/25/20 2152  INR 1.1   Cardiac Enzymes: No results for input(s): CKTOTAL, CKMB, CKMBINDEX, TROPONINI in the last 168 hours. BNP (last 3 results) No results for input(s): PROBNP in the last 8760 hours. HbA1C: No results for input(s): HGBA1C in the last 72 hours. CBG: No results for input(s): GLUCAP in the last 168 hours. Lipid Profile: No results for input(s): CHOL, HDL, LDLCALC, TRIG, CHOLHDL, LDLDIRECT in the last 72 hours. Thyroid Function Tests: No results for input(s): TSH, T4TOTAL, FREET4, T3FREE, THYROIDAB in the last 72 hours. Anemia Panel: No results for input(s): VITAMINB12, FOLATE, FERRITIN, TIBC,  IRON, RETICCTPCT in the last 72 hours. Urine analysis:    Component Value Date/Time   COLORURINE YELLOW 02/19/2020 2140   APPEARANCEUR HAZY (A) 02/19/2020 2140   LABSPEC 1.013 02/19/2020 2140   PHURINE 5.0 02/19/2020 2140   GLUCOSEU NEGATIVE 02/19/2020 2140   HGBUR LARGE (A) 02/19/2020 2140   BILIRUBINUR NEGATIVE 02/19/2020 2140   KETONESUR NEGATIVE 02/19/2020 2140   PROTEINUR NEGATIVE 02/19/2020 2140   UROBILINOGEN 0.2 09/11/2011 0005   NITRITE NEGATIVE 02/19/2020 2140   LEUKOCYTESUR NEGATIVE 02/19/2020 2140    Radiological Exams on Admission: DG Chest Port 1 View  Result Date: 03/25/2020 CLINICAL DATA:  PICC placement EXAM: PORTABLE CHEST 1 VIEW COMPARISON:  08/15/2007 FINDINGS: Right upper extremity central venous catheter tip projects over the subclavian region. No focal opacity or pleural effusion. Normal heart size. No pneumothorax. IMPRESSION: Right upper extremity central venous catheter tip projects over the subclavian region. Electronically Signed   By: Donavan Foil M.D.   On: 03/25/2020 19:10    EKG: Independently reviewed.   Assessment/Plan  PICC line displacement in the setting of osteomyelitis of the jaw requiring prolonged home IV antibiotic infusion until 4/7 will need to touch base with either PICC team or VIR again in the morning for placement Continue IV Rocephin in the morning Continue oral Flagyl  Hypokalemia  replete Continue home potassium 3 times daily and magnesium  Asymptomatic bradycardia Heart rate in the 50s here. EKG showing just normal sinus brady. Does not appear to be her baseline.  She had heart rate around 80s outpatient. Continue to monitor on telemetry  Hypertension Continue lisinopril-HCTZ  Multiple myeloma Chemotherapy held given osteomyelitis  Type 2 diabetes BG of 63. Will monitor without sliding scale.  DVT prophylaxis:SCDs Code Status: Full Family Communication: Plan discussed with patient at bedside  disposition Plan:  Home with observation Consults called:  Admission status: Observation  Swara Donze T Rhea Kaelin DO [facility]   If 7PM-7AM, please contact night-coverage [email or web address]   03/25/2020, 11:46 PM

## 2020-03-26 ENCOUNTER — Observation Stay: Payer: Self-pay

## 2020-03-26 DIAGNOSIS — T82898A Other specified complication of vascular prosthetic devices, implants and grafts, initial encounter: Secondary | ICD-10-CM | POA: Diagnosis not present

## 2020-03-26 DIAGNOSIS — Z452 Encounter for adjustment and management of vascular access device: Secondary | ICD-10-CM

## 2020-03-26 LAB — BASIC METABOLIC PANEL
Anion gap: 6 (ref 5–15)
BUN: <5 mg/dL — ABNORMAL LOW (ref 8–23)
CO2: 23 mmol/L (ref 22–32)
Calcium: 8.2 mg/dL — ABNORMAL LOW (ref 8.9–10.3)
Chloride: 113 mmol/L — ABNORMAL HIGH (ref 98–111)
Creatinine, Ser: 0.56 mg/dL (ref 0.44–1.00)
GFR calc Af Amer: >60 mL/min (ref >60)
GFR calc non Af Amer: >60 mL/min (ref >60)
Glucose, Bld: 68 mg/dL — ABNORMAL LOW (ref 70–99)
Potassium: 2.9 mmol/L — ABNORMAL LOW (ref 3.5–5.1)
Sodium: 142 mmol/L (ref 135–145)

## 2020-03-26 LAB — CBC
HCT: 24.4 % — ABNORMAL LOW (ref 36.0–46.0)
Hemoglobin: 7.6 g/dL — ABNORMAL LOW (ref 12.0–15.0)
MCH: 31 pg (ref 26.0–34.0)
MCHC: 31.1 g/dL (ref 30.0–36.0)
MCV: 99.6 fL (ref 80.0–100.0)
Platelets: 158 10*3/uL (ref 150–400)
RBC: 2.45 MIL/uL — ABNORMAL LOW (ref 3.87–5.11)
RDW: 19.5 % — ABNORMAL HIGH (ref 11.5–15.5)
WBC: 3.7 10*3/uL — ABNORMAL LOW (ref 4.0–10.5)
nRBC: 0.8 % — ABNORMAL HIGH (ref 0.0–0.2)

## 2020-03-26 MED ORDER — MAGNESIUM SULFATE 2 GM/50ML IV SOLN
2.0000 g | Freq: Once | INTRAVENOUS | Status: AC
  Administered 2020-03-26: 2 g via INTRAVENOUS
  Filled 2020-03-26: qty 50

## 2020-03-26 MED ORDER — LISINOPRIL 20 MG PO TABS: 20.0000 mg | ORAL_TABLET | Freq: Every day | ORAL | Status: DC

## 2020-03-26 MED ORDER — LISINOPRIL-HYDROCHLOROTHIAZIDE 20-25 MG PO TABS: 1.0000 | ORAL_TABLET | Freq: Every morning | ORAL | Status: DC

## 2020-03-26 MED ORDER — SODIUM CHLORIDE 0.9% FLUSH: 10.0000 mL | Freq: Two times a day (BID) | INTRAVENOUS | Status: DC

## 2020-03-26 MED ORDER — METRONIDAZOLE 500 MG PO TABS
500.0000 mg | ORAL_TABLET | Freq: Three times a day (TID) | ORAL | Status: DC
  Administered 2020-03-26: 500 mg via ORAL

## 2020-03-26 MED ORDER — ASPIRIN EC 81 MG PO TBEC
81.0000 mg | DELAYED_RELEASE_TABLET | Freq: Every day | ORAL | Status: DC
  Administered 2020-03-26: 81 mg via ORAL
  Filled 2020-03-26: qty 1

## 2020-03-26 MED ORDER — CHLORHEXIDINE GLUCONATE CLOTH 2 % EX PADS: 6.0000 | MEDICATED_PAD | Freq: Every day | CUTANEOUS | Status: DC

## 2020-03-26 MED ORDER — POTASSIUM CHLORIDE CRYS ER 20 MEQ PO TBCR
40.0000 meq | EXTENDED_RELEASE_TABLET | Freq: Three times a day (TID) | ORAL | Status: DC
  Administered 2020-03-26: 40 meq via ORAL
  Filled 2020-03-26: qty 2

## 2020-03-26 MED ORDER — HYDROCHLOROTHIAZIDE 25 MG PO TABS
25.0000 mg | ORAL_TABLET | Freq: Every day | ORAL | Status: DC
  Filled 2020-03-26: qty 1

## 2020-03-26 MED ORDER — SODIUM CHLORIDE 0.9% FLUSH: 10.0000 mL | INTRAVENOUS | Status: DC | PRN

## 2020-03-26 MED ORDER — ACYCLOVIR 400 MG PO TABS
400.0000 mg | ORAL_TABLET | Freq: Two times a day (BID) | ORAL | Status: DC
  Administered 2020-03-26: 400 mg via ORAL
  Filled 2020-03-26 (×2): qty 1

## 2020-03-26 MED ORDER — MAGNESIUM OXIDE 400 (241.3 MG) MG PO TABS
400.0000 mg | ORAL_TABLET | Freq: Two times a day (BID) | ORAL | Status: DC
  Administered 2020-03-26 (×2): 400 mg via ORAL
  Filled 2020-03-26 (×2): qty 1

## 2020-03-26 NOTE — ED Notes (Signed)
IV team bedside with patient.

## 2020-03-26 NOTE — ED Notes (Signed)
Contacted IV team regarding PICC line order.  Currently, staff is at [facility] and will call back with an ETA.

## 2020-03-26 NOTE — Discharge Summary (Signed)
Discharge Summary  Brittany Archer JOI:[id number] DOB: 1941/07/28  PCP: [name], PA-C  Admit date: 03/25/2020 Discharge date: 03/26/2020  Time spent: 35 minutes   Recommendations for Outpatient Follow-up:  1. Follow up with your PCP  2. Follow up with ID 3. Follow up with Oncology  Discharge Diagnoses:  Active Hospital Problems   Diagnosis Date Noted  . Encounter for peripheral line placement 03/25/2020  . Osteonecrosis ([place])   . Hypertension   . Type 2 diabetes mellitus ([place])   . Hypokalemia 01/03/2020  . Multiple myeloma not having achieved remission ([place]) 01/20/2018    Resolved Hospital Problems  No resolved problems to display.    Discharge Condition: Stable   Diet recommendation: Resume previous diet.   Vitals:   03/26/20 0900 03/26/20 1318  BP: (!) 155/60 133/69  Pulse: 63 64  Resp: 16 17  Temp:    SpO2: 100% 100%    History of present illness:  Brittany Archer is a 79 y.o. 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   Most history obtained from documentation and ED physician report since pt had poor recall. Patient has been receiving IV Rocephin for osteomyelitis of the right jaw through PICC line at home.  Today home health nurse has noted that the PICC line was pulled out further and advised her to present to the ED for exchange.  Chest x-ray shows right upper extremity central venous catheter tip projects over the subclavian region.  Patient unsure if she received her dose of antibiotics today but per ED physician report it was given prior to her admission to the ED.  Attempts were made to arrange for IR and PICC team to place line but this will not be able to be done until morning so  hospitalist was consulted for admission.  ED Course: Patient was bradycardic down to the 50s but denies any chest pain shortness of breath or dizziness.  She had notable hypokalemia of 2.9.  Magnesium 1.5.  Electrolytes were replaced.  03/26/20:  Seen and examined in the ED.  Alert and interactive.  She has no complaints.  Awaiting replacement of her PICC line by IV team.  Hospital Course:  Active Problems:   Multiple myeloma not having achieved remission ([facility])   Hypokalemia   Type 2 diabetes mellitus ([place])   Hypertension   Osteonecrosis ([place])   Encounter for peripheral line placement  PICC line displacement in the setting of osteomyelitis of the jaw requiring prolonged home IV antibiotic infusion until 4/7 IV team consulted for PICC line replacement. Continue PTA antibiotics  Hypokalemia, chronic  Repleted Continue PTA potassium 3 times daily and PTA po magnesium oxide BID Repeat BMP on Monday 04/01/20.  Asymptomatic bradycardia Heart rate in the mid 50s. EKG showing normal sinus bradycardia.  Hypertension Continue lisinopril-HCTZ  Multiple myeloma Chemotherapy held given osteomyelitis  Type 2 diabetes Resume home regimen and follow up with PCP   Code Status: Full    Discharge Exam: BP 133/69   Pulse 64   Temp 98.1 F (36.7 C) (Oral)   Resp 17   Ht _0  (1.626 m)   Wt 70.3 kg   SpO2 100%   BMI 26.61 kg/m  . General: 79 y.o. year-old female well developed well nourished  in no acute distress.  Alert and interactive. . Cardiovascular: Bradycardic with no rubs or gallops.  Marland Kitchen Respiratory: Clear to auscultation with no wheezes or rales. Good inspiratory effort. . Abdomen: Soft nontender nondistended with normal bowel sounds x4 quadrants. . Musculoskeletal: No lower extremity edema. 2/4 pulses in all 4 extremities. Marland Kitchen Psychiatry: Mood is appropriate for condition and setting  Discharge Instructions You were cared for by a hospitalist during your hospital  stay. If you have any questions about your discharge medications or the care you received while you were in the hospital after you are discharged, you can call the unit and asked to speak with the hospitalist on call if the hospitalist that took care of you is not available. Once you are discharged, your primary care physician will handle any further medical issues. Please note that NO REFILLS for any discharge medications will be authorized once you are discharged, as it is imperative that you return to your primary care physician (or establish a relationship with a primary care physician if you do not have one) for your aftercare needs so that they can reassess your need for medications and monitor your lab values.   Allergies as of 03/26/2020      Reactions   Motrin [ibuprofen] Rash      Medication List    TAKE these medications   acyclovir 400 MG tablet Commonly known as: ZOVIRAX TAKE 1 TABLET BY MOUTH TWICE DAILY   aspirin 81 MG tablet Take 81 mg by mouth daily.   bortezomib IV 3.5 MG injection Commonly known as: VELCADE Inject 3.5 mg into the vein once a week. weekly   cefTRIAXone  IVPB Commonly known as: ROCEPHIN Inject 2 g into the vein daily. Indication:  OM Last Day of Therapy:  04/03/20 Labs - Once weekly:  CBC/D and BMP, Labs - Every other week:  ESR and CRP   dexamethasone 4 MG tablet Commonly known as: DECADRON Take 10 tablets (40 mg) on days 1, 8, and 15 of chemo. Repeat every 21 days.   glipiZIDE 5 MG tablet Commonly known as: GLUCOTROL Take 5 mg by mouth daily before breakfast.   lenalidomide 20 MG capsule Commonly known as: Revlimid Take 1 capsule by mouth once daily for 14 days on, and 7 days off of a 21 day cycle.   lisinopril-hydrochlorothiazide 20-25 MG tablet Commonly known as: ZESTORETIC Take 1 tablet by mouth every morning.   magnesium oxide 400 (241.3 Mg) MG tablet Commonly known as: MAG-OX Take 1 tablet (400 mg total) by mouth 2 (two) times  daily.   metFORMIN 1000 MG tablet Commonly known as: GLUCOPHAGE Take 1,000 mg by mouth 2 times daily at 12 noon and 4 pm.   metroNIDAZOLE 500 MG tablet Commonly known as: FLAGYL Take 1 tablet (500 mg total) by mouth 3 (three) times daily.   potassium chloride SA 20 MEQ tablet Commonly known as: KLOR-CON Take 2 tablets (40 mEq total) by mouth 3 (three) times daily.      Allergies  Allergen Reactions  . Motrin [Ibuprofen] Rash   Follow-up Information    [name], PA-C. Call in 1 day(s).   Specialty: Physician Assistant Why: please call post a post hospital follow up appointment Contact information: [address] [place] [address] [phone]        Derek Jack, MD. Call in 1 day(s).   Specialty: Hematology Why: Please call for a post hospital follow up appointment Contact information: [place]  [place] [address] [phone]        Thayer Headings, MD. Call in 1 day(s).   Specialty: Infectious Diseases Why: Please call for a post hospital follow up appointment. Contact information: [address] [place] [address] [phone]            The results of significant diagnostics from this hospitalization (including imaging, microbiology, ancillary and laboratory) are listed below for reference.    Significant Diagnostic Studies: DG Chest Port 1 View  Result Date: 03/25/2020 CLINICAL DATA:  PICC placement EXAM: PORTABLE CHEST 1 VIEW COMPARISON:  08/15/2007 FINDINGS: Right upper extremity central venous catheter tip projects over the subclavian region. No focal opacity or pleural effusion. Normal heart size. No pneumothorax. IMPRESSION: Right upper extremity central venous catheter tip projects over the subclavian region. Electronically Signed   By: Donavan Foil M.D.   On: 03/25/2020 19:10   [place] EKG SITE RITE  Result Date: 03/26/2020 If Site Rite image not attached, placement could not be confirmed due to  current cardiac rhythm.   Microbiology: Recent Results (from the past 240 hour(s))  Respiratory Panel by RT PCR (Flu A&B, Covid) - Nasopharyngeal Swab     Status: None   Collection Time: 03/25/20  9:52 PM   Specimen: Nasopharyngeal Swab  Result Value Ref Range Status   SARS Coronavirus 2 by RT PCR NEGATIVE NEGATIVE Final    Comment: (NOTE) SARS-CoV-2 target nucleic acids are NOT DETECTED. The SARS-CoV-2 RNA is generally detectable in upper respiratoy specimens during the acute phase of infection. The lowest concentration of SARS-CoV-2 viral copies this assay can detect is 131 copies/mL. A negative result does not preclude SARS-Cov-2 infection and should not be used as the sole basis for treatment or other patient management decisions. A negative result may occur with  improper specimen collection/handling, submission of specimen other than nasopharyngeal swab, presence of viral mutation(s) within the areas targeted by this assay, and inadequate number of viral copies (<131 copies/mL). A negative result must be combined with clinical observations, patient history, and epidemiological information. The expected result is Negative. Fact Sheet for Patients:  PinkCheek.be Fact Sheet for Healthcare Providers:  GravelBags.it This test is not yet ap proved or cleared by the [place] FDA and  has been authorized for detection and/or diagnosis of SARS-CoV-2 by FDA under an Emergency Use Authorization (EUA). This EUA will remain  in effect (meaning this test can be used) for the duration of the COVID-19 declaration under Section 564(b)(1) of the Act, 21 U.S.C. section 360bbb-3(b)(1), unless the authorization is terminated or revoked sooner.    Influenza A by PCR NEGATIVE NEGATIVE Final   Influenza B by PCR NEGATIVE NEGATIVE Final    Comment: (NOTE) The Xpert Xpress SARS-CoV-2/FLU/RSV assay is intended as an aid in  the  diagnosis of influenza from Nasopharyngeal swab specimens and  should not be used as a sole basis for treatment. Nasal washings and  aspirates are unacceptable for Xpert Xpress SARS-CoV-2/FLU/RSV  testing. Fact Sheet for Patients: PinkCheek.be Fact Sheet for Healthcare Providers: GravelBags.it This test is not yet approved or cleared by the [place] FDA and  has been authorized for detection and/or diagnosis of SARS-CoV-2 by  FDA under an Emergency Use Authorization (EUA). This EUA will remain  in effect (meaning this test can be used) for the duration of the  Covid-19 declaration under Section 564(b)(1) of the Act, 21  U.S.C. section 360bbb-3(b)(1), unless the authorization is  terminated or revoked. Performed at  [facility], [place] [address], [place], [place] [address]      Labs: Basic Metabolic Panel: Recent Labs  Lab 03/20/20 0820 03/25/20 2152 03/26/20 0443  NA 141 142 142  K 2.7* 2.9* 2.9*  CL 112* 111 113*  CO2 _0 GLUCOSE 81 63* 68*  BUN 5* <5* <5*  CREATININE 0.75 0.70 0.56  CALCIUM 7.9* 8.4* 8.2*  MG  --  1.5*  --    Liver Function Tests: Recent Labs  Lab 03/20/20 0820 03/25/20 2152  AST 13* 12*  ALT 8 8  ALKPHOS 51 54  BILITOT 0.7 0.4  PROT 5.9* 6.2*  ALBUMIN 3.0* 3.3*   No results for input(s): LIPASE, AMYLASE in the last 168 hours. No results for input(s): AMMONIA in the last 168 hours. CBC: Recent Labs  Lab 03/20/20 0820 03/25/20 2152 03/26/20 0443  WBC 3.1* 4.3 3.7*  NEUTROABS 1.8 2.3  --   HGB 7.8* 8.6* 7.6*  HCT 24.7* 27.4* 24.4*  MCV 99.2 100.7* 99.6  PLT 140* 183 158   Cardiac Enzymes: No results for input(s): CKTOTAL, CKMB, CKMBINDEX, TROPONINI in the last 168 hours. BNP: BNP (last 3 results) No results for input(s): BNP in the last 8760 hours.  ProBNP (last 3 results) No results for input(s): PROBNP in the last 8760 hours.  CBG: No  results for input(s): GLUCAP in the last 168 hours.     Signed:  Kayleen Memos, MD [facility] 03/26/2020, 2:17 PM

## 2020-03-26 NOTE — ED Notes (Signed)
Pt ambulated to restroom without concerns. PT has steady and even gate.

## 2020-03-26 NOTE — Progress Notes (Signed)
Peripherally Inserted Central Catheter Placement  The IV Nurse has discussed with the patient and/or persons authorized to consent for the patient, the purpose of this procedure and the potential benefits and risks involved with this procedure.  The benefits include less needle sticks, lab draws from the catheter, and the patient may be discharged home with the catheter. Risks include, but not limited to, infection, bleeding, blood clot (thrombus formation), and puncture of an artery; nerve damage and irregular heartbeat and possibility to perform a PICC exchange if needed/ordered by physician.  Alternatives to this procedure were also discussed.  Bard Power PICC patient education guide, fact sheet on infection prevention and patient information card has been provided to patient /or left at bedside.    PICC Placement Documentation  PICC Single Lumen [id number] PICC Right Basilic 39 cm 1 cm (Active)  Indication for Insertion or Continuance of Line Prolonged intravenous therapies 03/26/20 1413  Exposed Catheter (cm) 1 cm 03/26/20 1413  Site Assessment Dry;Clean;Intact 03/26/20 1413  Line Status Flushed;Blood return noted 03/26/20 1413  Dressing Type Transparent 03/26/20 1413  Dressing Status Clean;Dry;Intact;Antimicrobial disc in place;Other (Comment) 03/26/20 1413  Dressing Intervention New dressing 03/26/20 1413  Dressing Change Due 04/02/20 03/26/20 1413       Christella Noa Albarece 03/26/2020, 2:14 PM

## 2020-03-26 NOTE — ED Notes (Addendum)
Per Dr. Irene Pap patient is medically cleared for discharge.  An After Visit Summary was printed and given to the patient. Discharge instructions given and no further questions at this time. Pt states her sister is taking her home. Pt A&Ox4 and ambulatory.

## 2020-03-26 NOTE — Telephone Encounter (Signed)
Patient's hemoglobin from 3/29 was 8.1. No further action needed.

## 2020-03-27 ENCOUNTER — Ambulatory Visit (HOSPITAL_COMMUNITY): Payer: Medicare Other

## 2020-03-27 ENCOUNTER — Other Ambulatory Visit (HOSPITAL_COMMUNITY): Payer: Medicare Other

## 2020-03-27 ENCOUNTER — Other Ambulatory Visit: Payer: Self-pay

## 2020-03-27 ENCOUNTER — Inpatient Hospital Stay (HOSPITAL_COMMUNITY): Payer: Medicare Other

## 2020-03-27 ENCOUNTER — Encounter (HOSPITAL_COMMUNITY): Payer: Self-pay

## 2020-03-27 VITALS — BP 144/57 | HR 62 | Temp 96.6°F | Resp 18

## 2020-03-27 DIAGNOSIS — C9000 Multiple myeloma not having achieved remission: Secondary | ICD-10-CM | POA: Diagnosis not present

## 2020-03-27 DIAGNOSIS — D649 Anemia, unspecified: Secondary | ICD-10-CM

## 2020-03-27 DIAGNOSIS — E538 Deficiency of other specified B group vitamins: Secondary | ICD-10-CM

## 2020-03-27 LAB — CBC WITH DIFFERENTIAL/PLATELET
Abs Immature Granulocytes: 0 10*3/uL (ref 0.00–0.07)
Basophils Absolute: 0 10*3/uL (ref 0.0–0.1)
Basophils Relative: 1 %
Eosinophils Absolute: 0 10*3/uL (ref 0.0–0.5)
Eosinophils Relative: 1 %
HCT: 26.9 % — ABNORMAL LOW (ref 36.0–46.0)
Hemoglobin: 8.2 g/dL — ABNORMAL LOW (ref 12.0–15.0)
Immature Granulocytes: 0 %
Lymphocytes Relative: 23 %
Lymphs Abs: 0.8 10*3/uL (ref 0.7–4.0)
MCH: 30.9 pg (ref 26.0–34.0)
MCHC: 30.5 g/dL (ref 30.0–36.0)
MCV: 101.5 fL — ABNORMAL HIGH (ref 80.0–100.0)
Monocytes Absolute: 0.3 10*3/uL (ref 0.1–1.0)
Monocytes Relative: 9 %
Neutro Abs: 2.3 10*3/uL (ref 1.7–7.7)
Neutrophils Relative %: 66 %
Platelets: 173 10*3/uL (ref 150–400)
RBC: 2.65 MIL/uL — ABNORMAL LOW (ref 3.87–5.11)
RDW: 20.4 % — ABNORMAL HIGH (ref 11.5–15.5)
WBC: 3.4 10*3/uL — ABNORMAL LOW (ref 4.0–10.5)
nRBC: 0.6 % — ABNORMAL HIGH (ref 0.0–0.2)

## 2020-03-27 LAB — COMPREHENSIVE METABOLIC PANEL
ALT: 7 U/L (ref 0–44)
AST: 9 U/L — ABNORMAL LOW (ref 15–41)
Albumin: 3.1 g/dL — ABNORMAL LOW (ref 3.5–5.0)
Alkaline Phosphatase: 56 U/L (ref 38–126)
Anion gap: 9 (ref 5–15)
BUN: 6 mg/dL — ABNORMAL LOW (ref 8–23)
CO2: 23 mmol/L (ref 22–32)
Calcium: 8.2 mg/dL — ABNORMAL LOW (ref 8.9–10.3)
Chloride: 108 mmol/L (ref 98–111)
Creatinine, Ser: 0.71 mg/dL (ref 0.44–1.00)
GFR calc Af Amer: >60 mL/min (ref >60)
GFR calc non Af Amer: >60 mL/min (ref >60)
Glucose, Bld: 89 mg/dL (ref 70–99)
Potassium: 3.2 mmol/L — ABNORMAL LOW (ref 3.5–5.1)
Sodium: 140 mmol/L (ref 135–145)
Total Bilirubin: 0.7 mg/dL (ref 0.3–1.2)
Total Protein: 5.8 g/dL — ABNORMAL LOW (ref 6.5–8.1)

## 2020-03-27 MED ORDER — EPOETIN ALFA-EPBX 10000 UNIT/ML IJ SOLN
20000.0000 [IU] | Freq: Once | INTRAMUSCULAR | Status: AC
  Administered 2020-03-27: 20000 [IU] via SUBCUTANEOUS
  Filled 2020-03-27: qty 2

## 2020-03-27 NOTE — Progress Notes (Signed)
Patient presents today for Retacrit injection. Hemoglobin reviewed prior to administration. VSS. Injection tolerated without incident or complaint. See MAR for details. Patient discharged in satisfactory condition with follow up instructions. 

## 2020-03-28 ENCOUNTER — Other Ambulatory Visit (HOSPITAL_COMMUNITY): Payer: Self-pay | Admitting: *Deleted

## 2020-03-28 DIAGNOSIS — C9000 Multiple myeloma not having achieved remission: Secondary | ICD-10-CM

## 2020-03-28 MED ORDER — LENALIDOMIDE 20 MG PO CAPS: ORAL_CAPSULE | ORAL | 0 refills | Status: DC

## 2020-04-01 ENCOUNTER — Telehealth: Payer: Self-pay

## 2020-04-01 ENCOUNTER — Encounter: Payer: Self-pay | Admitting: Internal Medicine

## 2020-04-01 NOTE — Telephone Encounter (Signed)
Stanton Kidney , Pharmacist with [place]:  According to [facility] patients PICC dressing was removed and PICC migrated ut by 6-7 cm.  Patient is due to complete ceftriaxone on Wednesday, April 03, 2020 and started on February 27, 2020  Pleases advise.   Laverle Patter, RN

## 2020-04-01 NOTE — Telephone Encounter (Signed)
Voice mail:  Brittany Archer with [facility] calling regarding abnormal labs  :  RBC : 2.57  Hgb: 8-1, potassium : 3.1 results seem to be conistent with previous.   Laverle Patter, RN

## 2020-04-01 NOTE — Telephone Encounter (Signed)
If it hasn't been done yet, she can just stop.  thanks

## 2020-04-01 NOTE — Telephone Encounter (Signed)
Voice Mail:  Return message from [place]:  The nurse measured the migration of 12 cm.     Per Dr Linus Salmons, Advanced given order to remove PICC and stop ceftriaxone.  Patient is to follow up as scheduled.  Laverle Patter, RN

## 2020-04-01 NOTE — Telephone Encounter (Signed)
Yes, consistent with previous results. That hemoglobin is actually really good for her.

## 2020-04-03 ENCOUNTER — Inpatient Hospital Stay (HOSPITAL_COMMUNITY): Payer: Medicare Other

## 2020-04-03 ENCOUNTER — Inpatient Hospital Stay (HOSPITAL_COMMUNITY): Payer: Medicare Other | Attending: Hematology

## 2020-04-03 ENCOUNTER — Encounter (HOSPITAL_COMMUNITY): Payer: Self-pay

## 2020-04-03 ENCOUNTER — Other Ambulatory Visit: Payer: Self-pay

## 2020-04-03 VITALS — BP 145/62 | HR 60 | Temp 97.3°F | Resp 18

## 2020-04-03 DIAGNOSIS — Z5112 Encounter for antineoplastic immunotherapy: Secondary | ICD-10-CM | POA: Insufficient documentation

## 2020-04-03 DIAGNOSIS — N289 Disorder of kidney and ureter, unspecified: Secondary | ICD-10-CM | POA: Insufficient documentation

## 2020-04-03 DIAGNOSIS — C9000 Multiple myeloma not having achieved remission: Secondary | ICD-10-CM

## 2020-04-03 DIAGNOSIS — E876 Hypokalemia: Secondary | ICD-10-CM | POA: Insufficient documentation

## 2020-04-03 DIAGNOSIS — N1832 Chronic kidney disease, stage 3b: Secondary | ICD-10-CM | POA: Insufficient documentation

## 2020-04-03 DIAGNOSIS — E538 Deficiency of other specified B group vitamins: Secondary | ICD-10-CM | POA: Insufficient documentation

## 2020-04-03 DIAGNOSIS — D649 Anemia, unspecified: Secondary | ICD-10-CM | POA: Diagnosis present

## 2020-04-03 DIAGNOSIS — D631 Anemia in chronic kidney disease: Secondary | ICD-10-CM | POA: Insufficient documentation

## 2020-04-03 LAB — CBC WITH DIFFERENTIAL/PLATELET
Abs Immature Granulocytes: 0.01 10*3/uL (ref 0.00–0.07)
Basophils Absolute: 0 10*3/uL (ref 0.0–0.1)
Basophils Relative: 1 %
Eosinophils Absolute: 0 10*3/uL (ref 0.0–0.5)
Eosinophils Relative: 1 %
HCT: 28.1 % — ABNORMAL LOW (ref 36.0–46.0)
Hemoglobin: 8.7 g/dL — ABNORMAL LOW (ref 12.0–15.0)
Immature Granulocytes: 0 %
Lymphocytes Relative: 25 %
Lymphs Abs: 0.9 10*3/uL (ref 0.7–4.0)
MCH: 31.8 pg (ref 26.0–34.0)
MCHC: 31 g/dL (ref 30.0–36.0)
MCV: 102.6 fL — ABNORMAL HIGH (ref 80.0–100.0)
Monocytes Absolute: 0.3 10*3/uL (ref 0.1–1.0)
Monocytes Relative: 8 %
Neutro Abs: 2.4 10*3/uL (ref 1.7–7.7)
Neutrophils Relative %: 65 %
Platelets: 182 10*3/uL (ref 150–400)
RBC: 2.74 MIL/uL — ABNORMAL LOW (ref 3.87–5.11)
RDW: 20.2 % — ABNORMAL HIGH (ref 11.5–15.5)
WBC: 3.7 10*3/uL — ABNORMAL LOW (ref 4.0–10.5)
nRBC: 0.5 % — ABNORMAL HIGH (ref 0.0–0.2)

## 2020-04-03 LAB — COMPREHENSIVE METABOLIC PANEL
ALT: 9 U/L (ref 0–44)
AST: 13 U/L — ABNORMAL LOW (ref 15–41)
Albumin: 3.2 g/dL — ABNORMAL LOW (ref 3.5–5.0)
Alkaline Phosphatase: 51 U/L (ref 38–126)
Anion gap: 6 (ref 5–15)
BUN: 8 mg/dL (ref 8–23)
CO2: 27 mmol/L (ref 22–32)
Calcium: 8.7 mg/dL — ABNORMAL LOW (ref 8.9–10.3)
Chloride: 109 mmol/L (ref 98–111)
Creatinine, Ser: 0.64 mg/dL (ref 0.44–1.00)
GFR calc Af Amer: >60 mL/min (ref >60)
GFR calc non Af Amer: >60 mL/min (ref >60)
Glucose, Bld: 69 mg/dL — ABNORMAL LOW (ref 70–99)
Potassium: 2.4 mmol/L — CL (ref 3.5–5.1)
Sodium: 142 mmol/L (ref 135–145)
Total Bilirubin: 0.6 mg/dL (ref 0.3–1.2)
Total Protein: 5.8 g/dL — ABNORMAL LOW (ref 6.5–8.1)

## 2020-04-03 MED ORDER — EPOETIN ALFA-EPBX 10000 UNIT/ML IJ SOLN
20000.0000 [IU] | Freq: Once | INTRAMUSCULAR | Status: AC
  Administered 2020-04-03: 20000 [IU] via SUBCUTANEOUS
  Filled 2020-04-03: qty 2

## 2020-04-03 MED ORDER — SODIUM CHLORIDE 0.9% FLUSH
10.0000 mL | Freq: Once | INTRAVENOUS | Status: AC
  Administered 2020-04-03: 10 mL via INTRAVENOUS

## 2020-04-03 MED ORDER — POTASSIUM CHLORIDE CRYS ER 20 MEQ PO TBCR
40.0000 meq | EXTENDED_RELEASE_TABLET | Freq: Two times a day (BID) | ORAL | Status: DC
  Administered 2020-04-03 (×2): 40 meq via ORAL
  Filled 2020-04-03 (×2): qty 2

## 2020-04-03 MED ORDER — POTASSIUM CHLORIDE 10 MEQ/100ML IV SOLN
10.0000 meq | INTRAVENOUS | Status: AC
  Administered 2020-04-03 (×2): 10 meq via INTRAVENOUS
  Filled 2020-04-03 (×2): qty 100

## 2020-04-03 MED ORDER — SODIUM CHLORIDE 0.9 % IV SOLN: Freq: Once | INTRAVENOUS | Status: AC

## 2020-04-03 NOTE — Progress Notes (Signed)
CRITICAL VALUE STICKER  CRITICAL VALUE: Potassium 2.4  RECEIVER (on-site recipient of call): [place] rn  [place] NOTIFIED: [id number] on 04/03/2020 MESSENGER (representative from lab):Carrolyn Leigh  MD NOTIFIED: Delton Coombes  TIME OF NOTIFICATION:1140  RESPONSE: Patient to receive Potassium 75meq by mouth before and after Potassium 20 meq peripherally verbal order Dr. Delton Coombes.   1303-patients pump alarming.  Right peripheral IV found dripping onto the floor with the patient wiping her arm with a tissue.  Right arm with 1 inch x 1 inch in diameter reddened/pink area with a small "knot" size 0.5 cm x 0.5 cm at the insertion site. Skin warm to touch.  Patient stated tenderness.  Ice applied.    1310-New peripheral IV started in left arm with no complaints at site.    1320-Dr. Delton Coombes notified.  No orders received.  Patient denied tenderness with right arm.  Skin warm and dry to touch with no bruising or swelling noted.  No redness or pink colored skin noted.  The small "Knot" no longer felt.  Patient resting with no s/s of distress noted.    Patient tolerated potassium runs with no complaints voiced.  Left peripheral IV site clean and dry with no bruising or swelling noted at site.  Band aid applied. Right arm warm and dry with no complaints of tenderness at site.  Right arm skin smooth at insertion site with no complaints of pain.  VSs with discharge and left with family with no s/s of distress noted.

## 2020-04-04 ENCOUNTER — Telehealth: Payer: Self-pay | Admitting: Pharmacist

## 2020-04-04 ENCOUNTER — Telehealth (HOSPITAL_COMMUNITY): Payer: Self-pay

## 2020-04-04 NOTE — Telephone Encounter (Signed)
Reviewed patient's home health labs:  4/5: hemoglobin 8.3  No further action needed at this time

## 2020-04-04 NOTE — Telephone Encounter (Signed)
Unable to reach the patient by her phone number.  Called and spoke with the patient's sister, Brittany Archer, regarding the patients right arm.  The sister stated [name] did not have any problems with her right arm.  The arm is warm and dry with no redness or tenderness.  Denied swelling with her right arm.  No knots felt and the patient was doing well.  Instructed the sister to call for any problems with understanding verbalized.

## 2020-04-05 NOTE — Progress Notes (Signed)

## 2020-04-10 ENCOUNTER — Inpatient Hospital Stay (HOSPITAL_COMMUNITY): Payer: Medicare Other

## 2020-04-10 ENCOUNTER — Encounter (HOSPITAL_COMMUNITY): Payer: Self-pay | Admitting: Hematology

## 2020-04-10 ENCOUNTER — Other Ambulatory Visit: Payer: Self-pay

## 2020-04-10 ENCOUNTER — Inpatient Hospital Stay (HOSPITAL_BASED_OUTPATIENT_CLINIC_OR_DEPARTMENT_OTHER): Payer: Medicare Other | Admitting: Hematology

## 2020-04-10 VITALS — BP 151/61 | HR 57 | Temp 96.4°F | Resp 16 | Wt 150.1 lb

## 2020-04-10 DIAGNOSIS — C9000 Multiple myeloma not having achieved remission: Secondary | ICD-10-CM

## 2020-04-10 DIAGNOSIS — Z5112 Encounter for antineoplastic immunotherapy: Secondary | ICD-10-CM | POA: Diagnosis not present

## 2020-04-10 DIAGNOSIS — E538 Deficiency of other specified B group vitamins: Secondary | ICD-10-CM

## 2020-04-10 DIAGNOSIS — D649 Anemia, unspecified: Secondary | ICD-10-CM

## 2020-04-10 LAB — CBC WITH DIFFERENTIAL/PLATELET
Abs Immature Granulocytes: 0.01 10*3/uL (ref 0.00–0.07)
Basophils Absolute: 0 10*3/uL (ref 0.0–0.1)
Basophils Relative: 1 %
Eosinophils Absolute: 0 10*3/uL (ref 0.0–0.5)
Eosinophils Relative: 1 %
HCT: 31.3 % — ABNORMAL LOW (ref 36.0–46.0)
Hemoglobin: 9.4 g/dL — ABNORMAL LOW (ref 12.0–15.0)
Immature Granulocytes: 0 %
Lymphocytes Relative: 26 %
Lymphs Abs: 0.8 10*3/uL (ref 0.7–4.0)
MCH: 32.4 pg (ref 26.0–34.0)
MCHC: 30 g/dL (ref 30.0–36.0)
MCV: 107.9 fL — ABNORMAL HIGH (ref 80.0–100.0)
Monocytes Absolute: 0.3 10*3/uL (ref 0.1–1.0)
Monocytes Relative: 8 %
Neutro Abs: 2.1 10*3/uL (ref 1.7–7.7)
Neutrophils Relative %: 64 %
Platelets: 191 10*3/uL (ref 150–400)
RBC: 2.9 MIL/uL — ABNORMAL LOW (ref 3.87–5.11)
RDW: 19.9 % — ABNORMAL HIGH (ref 11.5–15.5)
WBC: 3.2 10*3/uL — ABNORMAL LOW (ref 4.0–10.5)
nRBC: 0 % (ref 0.0–0.2)

## 2020-04-10 LAB — COMPREHENSIVE METABOLIC PANEL
ALT: 11 U/L (ref 0–44)
AST: 14 U/L — ABNORMAL LOW (ref 15–41)
Albumin: 3.5 g/dL (ref 3.5–5.0)
Alkaline Phosphatase: 56 U/L (ref 38–126)
Anion gap: 10 (ref 5–15)
BUN: 8 mg/dL (ref 8–23)
CO2: 23 mmol/L (ref 22–32)
Calcium: 9.7 mg/dL (ref 8.9–10.3)
Chloride: 108 mmol/L (ref 98–111)
Creatinine, Ser: 0.82 mg/dL (ref 0.44–1.00)
GFR calc Af Amer: >60 mL/min (ref >60)
GFR calc non Af Amer: >60 mL/min (ref >60)
Glucose, Bld: 83 mg/dL (ref 70–99)
Potassium: 3.9 mmol/L (ref 3.5–5.1)
Sodium: 141 mmol/L (ref 135–145)
Total Bilirubin: 0.6 mg/dL (ref 0.3–1.2)
Total Protein: 6.3 g/dL — ABNORMAL LOW (ref 6.5–8.1)

## 2020-04-10 LAB — LACTATE DEHYDROGENASE: LDH: 183 U/L (ref 98–192)

## 2020-04-10 MED ORDER — EPOETIN ALFA-EPBX 10000 UNIT/ML IJ SOLN
20000.0000 [IU] | Freq: Once | INTRAMUSCULAR | Status: AC
  Administered 2020-04-10: 12:00:00 20000 [IU] via SUBCUTANEOUS
  Filled 2020-04-10: qty 2

## 2020-04-10 MED ORDER — SODIUM CHLORIDE 0.9 % IV SOLN: Freq: Once | INTRAVENOUS | Status: DC | PRN

## 2020-04-10 MED ORDER — DIPHENHYDRAMINE HCL 50 MG/ML IJ SOLN: 50.0000 mg | Freq: Once | INTRAMUSCULAR | Status: DC | PRN

## 2020-04-10 MED ORDER — DIPHENHYDRAMINE HCL 50 MG/ML IJ SOLN: 25.0000 mg | Freq: Once | INTRAMUSCULAR | Status: DC | PRN

## 2020-04-10 MED ORDER — EPINEPHRINE HCL 0.1 MG/ML IJ SOLN: 0.2500 mg | Freq: Once | INTRAMUSCULAR | Status: DC | PRN

## 2020-04-10 MED ORDER — ALBUTEROL SULFATE (2.5 MG/3ML) 0.083% IN NEBU: 2.5000 mg | INHALATION_SOLUTION | Freq: Once | RESPIRATORY_TRACT | Status: DC | PRN

## 2020-04-10 MED ORDER — METHYLPREDNISOLONE SODIUM SUCC 125 MG IJ SOLR: 125.0000 mg | Freq: Once | INTRAMUSCULAR | Status: DC | PRN

## 2020-04-10 NOTE — Progress Notes (Signed)
Brittany Archer, [place] [address]   CLINIC:  Medical Oncology/Hematology  PCP:  Vesta Mixer [address] [place] [address] [phone]   REASON FOR VISIT:  Follow-up for IgA Kappa Multiple Myeloma   CURRENT THERAPY: RVD  BRIEF ONCOLOGIC HISTORY:  Oncology History  Multiple myeloma not having achieved remission ([facility])  01/20/2018 Initial Diagnosis   Multiple myeloma not having achieved remission ([facility])   01/26/2018 -  Chemotherapy   The patient had bortezomib SQ (VELCADE) chemo injection 2.5 mg, 1.3 mg/m2 = 2.5 mg, Subcutaneous,  Once, 27 of 29 cycles Administration: 2.5 mg (01/26/2018), 2.5 mg (02/02/2018), 2.5 mg (02/09/2018), 2.5 mg (02/16/2018), 2.5 mg (02/23/2018), 2.5 mg (03/02/2018), 2.5 mg (03/09/2018), 2.5 mg (03/30/2018), 2.5 mg (04/06/2018), 2.5 mg (04/13/2018), 2.5 mg (04/21/2018), 2.5 mg (05/06/2018), 2.5 mg (05/11/2018), 2.5 mg (05/20/2018), 2.5 mg (05/27/2018), 2.5 mg (06/10/2018), 2.5 mg (06/17/2018), 2.5 mg (06/24/2018), 2.5 mg (07/08/2018), 2.5 mg (07/15/2018), 2.5 mg (07/22/2018), 2.5 mg (08/05/2018), 2.5 mg (08/12/2018), 2.5 mg (09/02/2018), 2.5 mg (09/09/2018), 2.5 mg (09/16/2018), 2.5 mg (09/30/2018), 2.5 mg (10/07/2018), 2.5 mg (10/14/2018), 2.5 mg (10/31/2018), 2.5 mg (11/07/2018), 2.5 mg (11/14/2018), 2.5 mg (11/28/2018), 2.25 mg (12/05/2018), 2.25 mg (12/12/2018), 2.25 mg (12/26/2018), 2.25 mg (01/02/2019), 2.25 mg (01/09/2019), 2.25 mg (01/23/2019), 2.25 mg (01/30/2019), 2.25 mg (02/06/2019), 2.25 mg (02/20/2019), 2.25 mg (02/27/2019), 2.25 mg (03/06/2019), 2.25 mg (03/20/2019), 2.25 mg (03/27/2019), 2.25 mg (04/03/2019), 2.25 mg (04/17/2019), 2.25 mg (04/24/2019), 2.25 mg (05/01/2019), 2.25 mg (05/15/2019), 2.25 mg (05/23/2019), 2.25 mg (05/30/2019), 2.25 mg (06/13/2019), 2.25 mg (06/20/2019), 2.25 mg (06/27/2019), 2.25 mg (07/11/2019), 2.25 mg (07/18/2019), 2.25 mg (07/25/2019), 2.25 mg (08/08/2019), 2.25 mg (08/15/2019), 2.25 mg (08/22/2019), 2.25 mg (09/05/2019), 2.25 mg  (09/12/2019), 2.25 mg (09/19/2019), 2.25 mg (10/03/2019), 2.25 mg (10/10/2019), 2.25 mg (10/17/2019), 2.25 mg (10/31/2019), 2.25 mg (11/07/2019), 2.25 mg (11/14/2019), 2.25 mg (11/29/2019), 2.25 mg (12/06/2019), 2.25 mg (12/13/2019), 2.25 mg (01/03/2020), 2.25 mg (01/10/2020), 2.25 mg (01/17/2020), 2.25 mg (01/31/2020), 2.25 mg (02/07/2020), 2.25 mg (02/14/2020)  for chemotherapy treatment.         INTERVAL HISTORY:  Brittany Archer 79 y.o. female seen for follow-up of multiple myeloma.  She has completed IV antibiotics on 04/03/2020.  Denies any fevers or chills.  Appetite and energy levels are 75%.  No new pains reported.  Swelling in the face has decreased.  Denies any tingling or numbness in extremities.  No recent hospitalizations after discharge on 03/26/2020.  REVIEW OF SYSTEMS:  Review of Systems  All other systems reviewed and are negative.    PAST MEDICAL/SURGICAL HISTORY:  Past Medical History:  Diagnosis Date  . Breast cancer ([place])    left breast/ 2008/ surg/ rad tx  . Coronary artery disease   . Diabetes mellitus    Past Surgical History:  Procedure Laterality Date  . ABDOMINAL HYSTERECTOMY    . BREAST SURGERY    . DEBRIDEMENT MANDIBLE N/A 02/22/2020   Procedure: INCISION AND DRAINAGE WITH DEBRIDEMENT MANDIBLE;  Surgeon: Michael Litter, DMD;  Location: [facility];  Service: Oral Surgery;  Laterality: N/A;  . TOOTH EXTRACTION N/A 02/22/2020   Procedure: DENTAL RESTORATION/EXTRACTIONS;  Surgeon: Michael Litter, DMD;  Location: [facility];  Service: Oral Surgery;  Laterality: N/A;  DENTAL KIT REQUESTED     SOCIAL HISTORY:  Social History   Socioeconomic History  . Marital status: Divorced    Spouse name: Not on file  . Number of children: Not on file  . Years  of education: Not on file  . Highest education level: Not on file  Occupational History  . Not on file  Tobacco Use  . Smoking status: Never Smoker  . Smokeless tobacco: Never Used  Substance and Sexual Activity  . Alcohol use: No  .  Drug use: No  . Sexual activity: Yes    Birth control/protection: Surgical  Other Topics Concern  . Not on file  Social History Narrative  . Not on file   Social Determinants of Health   Financial Resource Strain:   . Difficulty of Paying Living Expenses:   Food Insecurity:   . Worried About Charity fundraiser in the Last Year:   . Arboriculturist in the Last Year:   Transportation Needs:   . Film/video editor (Medical):   Marland Kitchen Lack of Transportation (Non-Medical):   Physical Activity:   . Days of Exercise per Week:   . Minutes of Exercise per Session:   Stress:   . Feeling of Stress :   Social Connections:   . Frequency of Communication with Friends and Family:   . Frequency of Social Gatherings with Friends and Family:   . Attends Religious Services:   . Active Member of Clubs or Organizations:   . Attends Archivist Meetings:   Marland Kitchen Marital Status:   Intimate Partner Violence:   . Fear of Current or Ex-Partner:   . Emotionally Abused:   Marland Kitchen Physically Abused:   . Sexually Abused:     FAMILY HISTORY:  Family History  Problem Relation Age of Onset  . Obesity Sister     CURRENT MEDICATIONS:  Outpatient Encounter Medications as of 04/10/2020  Medication Sig Note  . acyclovir (ZOVIRAX) 400 MG tablet TAKE 1 TABLET BY MOUTH TWICE DAILY (Patient taking differently: Take 400 mg by mouth 2 (two) times daily. )   . aspirin 81 MG tablet Take 81 mg by mouth daily.     . bortezomib IV (VELCADE) 3.5 MG injection Inject 3.5 mg into the vein once a week. weekly    . chlorhexidine (PERIDEX) 0.12 % solution Use as directed 15 mLs in the mouth or throat 2 (two) times daily.    Marland Kitchen dexamethasone (DECADRON) 4 MG tablet Take 10 tablets (40 mg) on days 1, 8, and 15 of chemo. Repeat every 21 days. 03/25/2020: On hold  . glipiZIDE (GLUCOTROL) 5 MG tablet Take 5 mg by mouth daily before breakfast.    . lenalidomide (REVLIMID) 20 MG capsule Take 1 capsule by mouth once daily for 14  days on, and 7 days off of a 21 day cycle.   Marland Kitchen lisinopril-hydrochlorothiazide (PRINZIDE,ZESTORETIC) 20-25 MG tablet Take 1 tablet by mouth every morning.    . magnesium oxide (MAG-OX) 400 (241.3 Mg) MG tablet Take 1 tablet (400 mg total) by mouth 2 (two) times daily.   . metFORMIN (GLUCOPHAGE) 1000 MG tablet Take 1,000 mg by mouth 2 times daily at 12 noon and 4 pm.     . potassium chloride SA (KLOR-CON) 20 MEQ tablet Take 2 tablets (40 mEq total) by mouth 3 (three) times daily.   . [DISCONTINUED] cefTRIAXone (ROCEPHIN) IVPB Inject 2 g into the vein daily. Indication:  OM Last Day of Therapy:  04/03/20 Labs - Once weekly:  CBC/D and BMP, Labs - Every other week:  ESR and CRP (Patient not taking: Reported on 04/10/2020) 03/25/2020: Pt's sister said she took it    No facility-administered encounter medications on file as of  04/10/2020.    ALLERGIES:  Allergies  Allergen Reactions  . Motrin [Ibuprofen] Rash     PHYSICAL EXAM:  ECOG Performance status: 1  Vitals:   04/10/20 1027  BP: (!) 151/61  Pulse: (!) 57  Resp: 16  Temp: (!) 96.4 F (35.8 C)  SpO2: 100%   Filed Weights   04/10/20 1027  Weight: 150 lb 1.6 oz (68.1 kg)    Physical Exam Vitals reviewed.  Constitutional:      Appearance: Normal appearance. She is obese.  HENT:     Head: Normocephalic.     Nose: Nose normal.     Mouth/Throat:     Mouth: Mucous membranes are moist.     Pharynx: Oropharynx is clear.  Eyes:     Extraocular Movements: Extraocular movements intact.     Conjunctiva/sclera: Conjunctivae normal.  Cardiovascular:     Rate and Rhythm: Normal rate and regular rhythm.     Pulses: Normal pulses.     Heart sounds: Normal heart sounds.  Pulmonary:     Effort: Pulmonary effort is normal.     Breath sounds: Normal breath sounds.  Abdominal:     General: Bowel sounds are normal.     Palpations: Abdomen is soft.  Musculoskeletal:        General: Normal range of motion.     Cervical back: Normal range  of motion.  Skin:    General: Skin is warm and dry.  Neurological:     General: No focal deficit present.     Mental Status: She is alert and oriented to person, place, and time. Mental status is at baseline.  Psychiatric:        Mood and Affect: Mood normal.        Behavior: Behavior normal.        Thought Content: Thought content normal.        Judgment: Judgment normal.      LABORATORY DATA:  I have reviewed the labs as listed.  CBC    Component Value Date/Time   WBC 3.2 (L) 04/10/2020 0936   RBC 2.90 (L) 04/10/2020 0936   HGB 9.4 (L) 04/10/2020 0936   HCT 31.3 (L) 04/10/2020 0936   PLT 191 04/10/2020 0936   MCV 107.9 (H) 04/10/2020 0936   MCH 32.4 04/10/2020 0936   MCHC 30.0 04/10/2020 0936   RDW 19.9 (H) 04/10/2020 0936   LYMPHSABS 0.8 04/10/2020 0936   MONOABS 0.3 04/10/2020 0936   EOSABS 0.0 04/10/2020 0936   BASOSABS 0.0 04/10/2020 0936   CMP Latest Ref Rng & Units 04/10/2020 04/03/2020 03/27/2020  Glucose 70 - 99 mg/dL 83 69(L) 89  BUN 8 - 23 mg/dL 8 8 6(L)  Creatinine 0.44 - 1.00 mg/dL 0.82 0.64 0.71  Sodium 135 - 145 mmol/L 141 142 140  Potassium 3.5 - 5.1 mmol/L 3.9 2.4(LL) 3.2(L)  Chloride 98 - 111 mmol/L 108 109 108  CO2 22 - 32 mmol/L '23 27 23  ' Calcium 8.9 - 10.3 mg/dL 9.7 8.7(L) 8.2(L)  Total Protein 6.5 - 8.1 g/dL 6.3(L) 5.8(L) 5.8(L)  Total Bilirubin 0.3 - 1.2 mg/dL 0.6 0.6 0.7  Alkaline Phos 38 - 126 U/L 56 51 56  AST 15 - 41 U/L 14(L) 13(L) 9(L)  ALT 0 - 44 U/L '11 9 7      ' I have reviewed hospital records and scans.   ASSESSMENT & PLAN:   Multiple myeloma not having achieved remission ([place]) 1.  IgA kappa plasma cell myeloma, stage I: -  RVD regimen started on 01/26/2018 -Myeloma panel from 02/28/2020 shows M spike 0.3 g.  Light chain ratio was normal at 1.39. -She has finished IV ceftriaxone for osteomyelitis of the mandible on 04/03/2020. -She is reportedly receiving oral antibiotics at this time.  I will continue to hold her treatments for  myeloma. -We will send myeloma labs today.  I will see her back in 2 weeks for follow-up and review the results.  We will decide at that time when to start back on treatment.  2.  Osteomyelitis of the mandible/dental abscess: -She finished IV ceftriaxone on 04/03/2020. -I will hold her bisphosphonates indefinitely.  3.  Severe hypokalemia: -She is taking 40 mEq 3 times a day of K-Dur.  Today potassium is surprisingly in the normal range at 3.9.  4.  Macrocytic anemia: -Hemoglobin today is 9.4.  White count is also slightly low at 3.4.  MCV is elevated at 107.9. -This is from combination of myeloma treatments, bone marrow suppression and renal insufficiency. -She will continue Retacrit as needed.   Orders placed this encounter:  Orders Placed This Encounter  Procedures  . CBC with Differential/Platelet  . Comprehensive metabolic panel  . Magnesium  . Lactate dehydrogenase  . Protein electrophoresis, serum  . Kappa/lambda light chains  . Lactate dehydrogenase   Total time spent is 30 minutes with more than 50% of the time spent face-to-face discussing her diagnosis, treatment plan, review of hospital medical records, counseling and coordination of care.   Derek Jack, MD  [place] [phone]

## 2020-04-10 NOTE — Progress Notes (Signed)
Patient has been assessed, vital signs and labs have been reviewed by Dr. Delton Coombes. ANC, Creatinine, LFTs, and Platelets are within treatment parameters per Dr. Delton Coombes. Please draw myeloma labs today. Also hold Velcade but give Retacrit per Dr. Delton Coombes.

## 2020-04-10 NOTE — Assessment & Plan Note (Addendum)
1.  IgA kappa plasma cell myeloma, stage I: -RVD regimen started on 01/26/2018 -Myeloma panel from 02/28/2020 shows M spike 0.3 g.  Light chain ratio was normal at 1.39. -She has finished IV ceftriaxone for osteomyelitis of the mandible on 04/03/2020. -She is reportedly receiving oral antibiotics at this time.  I will continue to hold her treatments for myeloma. -We will send myeloma labs today.  I will see her back in 2 weeks for follow-up and review the results.  We will decide at that time when to start back on treatment.  2.  Osteomyelitis of the mandible/dental abscess: -She finished IV ceftriaxone on 04/03/2020. -I will hold her bisphosphonates indefinitely.  3.  Severe hypokalemia: -She is taking 40 mEq 3 times a day of K-Dur.  Today potassium is surprisingly in the normal range at 3.9.  4.  Macrocytic anemia: -Hemoglobin today is 9.4.  White count is also slightly low at 3.4.  MCV is elevated at 107.9. -This is from combination of myeloma treatments, bone marrow suppression and renal insufficiency. -She will continue Retacrit as needed.

## 2020-04-10 NOTE — Progress Notes (Signed)
Message received from [facility] LPN/ Dr. Delton Coombes. Hold Velcade today. Give Retacrit 20,000 units today [place].     Brittany Archer presents today for injection per MD orders. Retacrit administered SQ in left Upper Arm. Administration without incident. Patient tolerated well. Vital signs stable. No complaints at this time. Discharged from clinic ambulatory. F/U with [facility] as scheduled.

## 2020-04-10 NOTE — Patient Instructions (Signed)
[facility] at [facility]  Discharge Instructions:   _______________________________________________________________  Thank you for choosing [facility] at [facility] to provide your oncology and hematology care.  To afford each patient quality time with our providers, please arrive at least 15 minutes before your scheduled appointment.  You need to re-schedule your appointment if you arrive 10 or more minutes late.  We strive to give you quality time with our providers, and arriving late affects you and other patients whose appointments are after yours.  Also, if you no show three or more times for appointments you may be dismissed from the clinic.  Again, thank you for choosing [facility] at [facility]. Our hope is that these requests will allow you access to exceptional care and in a timely manner. _______________________________________________________________  If you have questions after your visit, please contact our office at [phone] between the hours of 8:30 a.m. and 5:00 p.m. Voicemails left after 4:30 p.m. will not be returned until the following business day. _______________________________________________________________  For prescription refill requests, have your pharmacy contact our office. _______________________________________________________________  Recommendations made by the consultant and any test results will be sent to your referring physician. _______________________________________________________________ 

## 2020-04-10 NOTE — Patient Instructions (Signed)
[facility] at [facility] Discharge Instructions  You were seen today by Dr. Katragadda. He went over your recent lab results. He will see you back in 2 weeks for labs, injection and follow up.   Thank you for choosing [facility] at [facility] to provide your oncology and hematology care.  To afford each patient quality time with our provider, please arrive at least 15 minutes before your scheduled appointment time.   If you have a lab appointment with the Cancer Center please come in thru the  Main Entrance and check in at the main information desk  You need to re-schedule your appointment should you arrive 10 or more minutes late.  We strive to give you quality time with our providers, and arriving late affects you and other patients whose appointments are after yours.  Also, if you no show three or more times for appointments you may be dismissed from the clinic at the providers discretion.     Again, thank you for choosing [facility].  Our hope is that these requests will decrease the amount of time that you wait before being seen by our physicians.       _____________________________________________________________  Should you have questions after your visit to [facility], please contact our office at [phone] between the hours of 8:00 a.m. and 4:30 p.m.  Voicemails left after 4:00 p.m. will not be returned until the following business day.  For prescription refill requests, have your pharmacy contact our office and allow 72 hours.    Cancer Center Support Programs:   > Cancer Support Group  2nd Tuesday of the month 1pm-2pm, Journey Room    

## 2020-04-11 LAB — KAPPA/LAMBDA LIGHT CHAINS
Kappa free light chain: 18.8 mg/L (ref 3.3–19.4)
Kappa, lambda light chain ratio: 1.36 (ref 0.26–1.65)
Lambda free light chains: 13.8 mg/L (ref 5.7–26.3)

## 2020-04-12 LAB — PROTEIN ELECTROPHORESIS, SERUM
A/G Ratio: 1.3 (ref 0.7–1.7)
Albumin ELP: 3.4 g/dL (ref 2.9–4.4)
Alpha-1-Globulin: 0.2 g/dL (ref 0.0–0.4)
Alpha-2-Globulin: 0.7 g/dL (ref 0.4–1.0)
Beta Globulin: 0.9 g/dL (ref 0.7–1.3)
Gamma Globulin: 0.8 g/dL (ref 0.4–1.8)
Globulin, Total: 2.6 g/dL (ref 2.2–3.9)
Total Protein ELP: 6 g/dL (ref 6.0–8.5)

## 2020-04-17 ENCOUNTER — Other Ambulatory Visit: Payer: Self-pay

## 2020-04-17 ENCOUNTER — Inpatient Hospital Stay (HOSPITAL_COMMUNITY): Payer: Medicare Other

## 2020-04-17 VITALS — BP 140/55 | HR 72 | Temp 96.9°F | Resp 18 | Wt 153.0 lb

## 2020-04-17 DIAGNOSIS — C9000 Multiple myeloma not having achieved remission: Secondary | ICD-10-CM

## 2020-04-17 DIAGNOSIS — E538 Deficiency of other specified B group vitamins: Secondary | ICD-10-CM

## 2020-04-17 DIAGNOSIS — Z5112 Encounter for antineoplastic immunotherapy: Secondary | ICD-10-CM | POA: Diagnosis not present

## 2020-04-17 LAB — CBC WITH DIFFERENTIAL/PLATELET
Abs Immature Granulocytes: 0 10*3/uL (ref 0.00–0.07)
Basophils Absolute: 0 10*3/uL (ref 0.0–0.1)
Basophils Relative: 1 %
Eosinophils Absolute: 0 10*3/uL (ref 0.0–0.5)
Eosinophils Relative: 1 %
HCT: 32.1 % — ABNORMAL LOW (ref 36.0–46.0)
Hemoglobin: 9.8 g/dL — ABNORMAL LOW (ref 12.0–15.0)
Immature Granulocytes: 0 %
Lymphocytes Relative: 27 %
Lymphs Abs: 1 10*3/uL (ref 0.7–4.0)
MCH: 33.3 pg (ref 26.0–34.0)
MCHC: 30.5 g/dL (ref 30.0–36.0)
MCV: 109.2 fL — ABNORMAL HIGH (ref 80.0–100.0)
Monocytes Absolute: 0.3 10*3/uL (ref 0.1–1.0)
Monocytes Relative: 9 %
Neutro Abs: 2.2 10*3/uL (ref 1.7–7.7)
Neutrophils Relative %: 62 %
Platelets: 209 10*3/uL (ref 150–400)
RBC: 2.94 MIL/uL — ABNORMAL LOW (ref 3.87–5.11)
RDW: 19.2 % — ABNORMAL HIGH (ref 11.5–15.5)
WBC: 3.5 10*3/uL — ABNORMAL LOW (ref 4.0–10.5)
nRBC: 0 % (ref 0.0–0.2)

## 2020-04-17 LAB — COMPREHENSIVE METABOLIC PANEL
ALT: 13 U/L (ref 0–44)
AST: 14 U/L — ABNORMAL LOW (ref 15–41)
Albumin: 3.5 g/dL (ref 3.5–5.0)
Alkaline Phosphatase: 57 U/L (ref 38–126)
Anion gap: 8 (ref 5–15)
BUN: 15 mg/dL (ref 8–23)
CO2: 26 mmol/L (ref 22–32)
Calcium: 9.5 mg/dL (ref 8.9–10.3)
Chloride: 107 mmol/L (ref 98–111)
Creatinine, Ser: 0.89 mg/dL (ref 0.44–1.00)
GFR calc Af Amer: >60 mL/min (ref >60)
GFR calc non Af Amer: >60 mL/min (ref >60)
Glucose, Bld: 101 mg/dL — ABNORMAL HIGH (ref 70–99)
Potassium: 3.9 mmol/L (ref 3.5–5.1)
Sodium: 141 mmol/L (ref 135–145)
Total Bilirubin: 0.7 mg/dL (ref 0.3–1.2)
Total Protein: 6.4 g/dL — ABNORMAL LOW (ref 6.5–8.1)

## 2020-04-17 MED ORDER — BORTEZOMIB CHEMO SQ INJECTION 3.5 MG (2.5MG/ML)
1.3000 mg/m2 | Freq: Once | INTRAMUSCULAR | Status: AC
  Administered 2020-04-17: 2.25 mg via SUBCUTANEOUS
  Filled 2020-04-17: qty 0.9

## 2020-04-17 MED ORDER — CYANOCOBALAMIN 1000 MCG/ML IJ SOLN
1000.0000 ug | Freq: Once | INTRAMUSCULAR | Status: AC
  Administered 2020-04-17: 12:00:00 1000 ug via INTRAMUSCULAR
  Filled 2020-04-17: qty 1

## 2020-04-17 MED ORDER — EPOETIN ALFA-EPBX 10000 UNIT/ML IJ SOLN
20000.0000 [IU] | Freq: Once | INTRAMUSCULAR | Status: AC
  Administered 2020-04-17: 12:00:00 20000 [IU] via SUBCUTANEOUS
  Filled 2020-04-17: qty 2

## 2020-04-17 MED ORDER — DEXAMETHASONE 4 MG PO TABS
40.0000 mg | ORAL_TABLET | Freq: Once | ORAL | Status: AC
  Administered 2020-04-17: 12:00:00 40 mg via ORAL
  Filled 2020-04-17: qty 10

## 2020-04-17 MED ORDER — PROCHLORPERAZINE MALEATE 10 MG PO TABS
10.0000 mg | ORAL_TABLET | Freq: Once | ORAL | Status: AC
  Administered 2020-04-17: 10 mg via ORAL
  Filled 2020-04-17: qty 1

## 2020-04-17 NOTE — Progress Notes (Signed)
[name] presents today for [phone] Velcade injection. Pt denies new complaints or symptoms since last week. She states her appetite and energy levels are good. Denies n/v/d. Vital signs and lab results stable, K 3.9 today. Hgb 9.8. Per treatment parameters, ok to proceed with Velcade, B12, and Retacrit  injections today.  Injections tolerated without incident or complaint. See MAR for details. All sites clean and dry, bandaids applied. Discharged in satisfactory condition with follow up instructions.

## 2020-04-17 NOTE — Patient Instructions (Signed)
[facility] Discharge Instructions for Patients Receiving Chemotherapy   Beginning January 23rd 2017 lab work for the Cancer Center will be done in the  Main lab at [facility] on 1st floor. If you have a lab appointment with the Cancer Center please come in thru the  Main Entrance and check in at the main information desk   Today you received the following chemotherapy agents Velcade  To help prevent nausea and vomiting after your treatment, we encourage you to take your nausea medication    If you develop nausea and vomiting, or diarrhea that is not controlled by your medication, call the clinic.  The clinic phone number is [phone]. Office hours are Monday-Friday 8:30am-5:00pm.  BELOW ARE SYMPTOMS THAT SHOULD BE REPORTED IMMEDIATELY:  *FEVER GREATER THAN 101.0 F  *CHILLS WITH OR WITHOUT FEVER  NAUSEA AND VOMITING THAT IS NOT CONTROLLED WITH YOUR NAUSEA MEDICATION  *UNUSUAL SHORTNESS OF BREATH  *UNUSUAL BRUISING OR BLEEDING  TENDERNESS IN MOUTH AND THROAT WITH OR WITHOUT PRESENCE OF ULCERS  *URINARY PROBLEMS  *BOWEL PROBLEMS  UNUSUAL RASH Items with * indicate a potential emergency and should be followed up as soon as possible. If you have an emergency after office hours please contact your primary care physician or go to the nearest emergency department.  Please call the clinic during office hours if you have any questions or concerns.   You may also contact the Patient Navigator at [phone] should you have any questions or need assistance in obtaining follow up care.      Resources For Cancer Patients and their Caregivers ? American Cancer Society: Can assist with transportation, wigs, general needs, runs Look Good Feel Better.        1-888-227-6333 ? Cancer Care: Provides financial assistance, online support groups, medication/co-pay assistance.  1-800-813-HOPE (4673) ? [facility] Assists [place] cancer  patients and their families through emotional , educational and financial support.  336-427-4357 ? [place] DSS Where to apply for food stamps, Medicaid and utility assistance. 336-342-1394 ? [facility]: Transportation to medical appointments. 336-347-2287 ? Social Security Administration: May apply for disability if have a Stage IV cancer. 336-342-7796 1-800-772-1213 ? [place] Aging, Disability and Transit Services: Assists with nutrition, care and transit needs. 336-349-2343          

## 2020-04-19 ENCOUNTER — Other Ambulatory Visit (HOSPITAL_COMMUNITY): Payer: Self-pay | Admitting: Hematology

## 2020-04-24 ENCOUNTER — Other Ambulatory Visit: Payer: Self-pay

## 2020-04-24 ENCOUNTER — Other Ambulatory Visit (HOSPITAL_COMMUNITY): Payer: Medicare Other

## 2020-04-24 ENCOUNTER — Inpatient Hospital Stay (HOSPITAL_COMMUNITY): Payer: Medicare Other

## 2020-04-24 ENCOUNTER — Encounter (HOSPITAL_COMMUNITY): Payer: Self-pay | Admitting: Hematology

## 2020-04-24 ENCOUNTER — Ambulatory Visit (INDEPENDENT_AMBULATORY_CARE_PROVIDER_SITE_OTHER): Payer: Medicare Other | Admitting: Internal Medicine

## 2020-04-24 ENCOUNTER — Inpatient Hospital Stay (HOSPITAL_BASED_OUTPATIENT_CLINIC_OR_DEPARTMENT_OTHER): Payer: Medicare Other | Admitting: Hematology

## 2020-04-24 ENCOUNTER — Encounter: Payer: Self-pay | Admitting: Internal Medicine

## 2020-04-24 VITALS — BP 139/59 | HR 63 | Temp 96.2°F | Resp 18 | Wt 150.0 lb

## 2020-04-24 VITALS — BP 128/68 | HR 74 | Temp 98.6°F | Wt 152.0 lb

## 2020-04-24 DIAGNOSIS — Z5112 Encounter for antineoplastic immunotherapy: Secondary | ICD-10-CM | POA: Diagnosis not present

## 2020-04-24 DIAGNOSIS — M272 Inflammatory conditions of jaws: Secondary | ICD-10-CM | POA: Diagnosis present

## 2020-04-24 DIAGNOSIS — C9000 Multiple myeloma not having achieved remission: Secondary | ICD-10-CM

## 2020-04-24 DIAGNOSIS — D508 Other iron deficiency anemias: Secondary | ICD-10-CM | POA: Diagnosis not present

## 2020-04-24 LAB — COMPREHENSIVE METABOLIC PANEL
ALT: 8 U/L (ref 0–44)
AST: 8 U/L — ABNORMAL LOW (ref 15–41)
Albumin: 3.5 g/dL (ref 3.5–5.0)
Alkaline Phosphatase: 51 U/L (ref 38–126)
Anion gap: 11 (ref 5–15)
BUN: 16 mg/dL (ref 8–23)
CO2: 27 mmol/L (ref 22–32)
Calcium: 10.3 mg/dL (ref 8.9–10.3)
Chloride: 103 mmol/L (ref 98–111)
Creatinine, Ser: 0.83 mg/dL (ref 0.44–1.00)
GFR calc Af Amer: >60 mL/min (ref >60)
GFR calc non Af Amer: >60 mL/min (ref >60)
Glucose, Bld: 97 mg/dL (ref 70–99)
Potassium: 3.5 mmol/L (ref 3.5–5.1)
Sodium: 141 mmol/L (ref 135–145)
Total Bilirubin: 0.6 mg/dL (ref 0.3–1.2)
Total Protein: 6.4 g/dL — ABNORMAL LOW (ref 6.5–8.1)

## 2020-04-24 LAB — CBC WITH DIFFERENTIAL/PLATELET
Abs Immature Granulocytes: 0.01 10*3/uL (ref 0.00–0.07)
Basophils Absolute: 0 10*3/uL (ref 0.0–0.1)
Basophils Relative: 0 %
Eosinophils Absolute: 0 10*3/uL (ref 0.0–0.5)
Eosinophils Relative: 1 %
HCT: 34.3 % — ABNORMAL LOW (ref 36.0–46.0)
Hemoglobin: 10.8 g/dL — ABNORMAL LOW (ref 12.0–15.0)
Immature Granulocytes: 0 %
Lymphocytes Relative: 23 %
Lymphs Abs: 0.8 10*3/uL (ref 0.7–4.0)
MCH: 33.9 pg (ref 26.0–34.0)
MCHC: 31.5 g/dL (ref 30.0–36.0)
MCV: 107.5 fL — ABNORMAL HIGH (ref 80.0–100.0)
Monocytes Absolute: 0.4 10*3/uL (ref 0.1–1.0)
Monocytes Relative: 10 %
Neutro Abs: 2.4 10*3/uL (ref 1.7–7.7)
Neutrophils Relative %: 66 %
Platelets: 184 10*3/uL (ref 150–400)
RBC: 3.19 MIL/uL — ABNORMAL LOW (ref 3.87–5.11)
RDW: 18.1 % — ABNORMAL HIGH (ref 11.5–15.5)
WBC: 3.7 10*3/uL — ABNORMAL LOW (ref 4.0–10.5)
nRBC: 0 % (ref 0.0–0.2)

## 2020-04-24 LAB — MAGNESIUM: Magnesium: 1.4 mg/dL — ABNORMAL LOW (ref 1.7–2.4)

## 2020-04-24 NOTE — Progress Notes (Signed)
Patient presents today for treatment and follow up appointment with Dr. Delton Coombes. LABS within parameters for treatment. HGB today 10.8. Sodium 141 and Potassium 3.5.  Vital signs within parameters for treatment. Patient has no complaints of any significant changes since her last visit.  Message received from [facility] LPN/ [place]. No treatment today, no Retacrit today or XGeva. Patient to be seen in one month per [facility] LPN.   No complaints at this time. Discharged from clinic ambulatory. F/U with [facility] as scheduled.

## 2020-04-24 NOTE — Progress Notes (Signed)
[place], [place] [address]   CLINIC:  Medical Oncology/Hematology  PCP:  Vesta Mixer [address] [place] [address] [phone]   REASON FOR VISIT:  Follow-up for IgA Kappa Multiple Myeloma   CURRENT THERAPY: RVD  BRIEF ONCOLOGIC HISTORY:  Oncology History  Multiple myeloma not having achieved remission ([facility])  01/20/2018 Initial Diagnosis   Multiple myeloma not having achieved remission ([place])   01/26/2018 -  Chemotherapy   The patient had bortezomib SQ (VELCADE) chemo injection 2.5 mg, 1.3 mg/m2 = 2.5 mg, Subcutaneous,  Once, 28 of 29 cycles Administration: 2.5 mg (01/26/2018), 2.5 mg (02/02/2018), 2.5 mg (02/09/2018), 2.5 mg (02/16/2018), 2.5 mg (02/23/2018), 2.5 mg (03/02/2018), 2.5 mg (03/09/2018), 2.5 mg (03/30/2018), 2.5 mg (04/06/2018), 2.5 mg (04/13/2018), 2.5 mg (04/21/2018), 2.5 mg (05/06/2018), 2.5 mg (05/11/2018), 2.5 mg (05/20/2018), 2.5 mg (05/27/2018), 2.5 mg (06/10/2018), 2.5 mg (06/17/2018), 2.5 mg (06/24/2018), 2.5 mg (07/08/2018), 2.5 mg (07/15/2018), 2.5 mg (07/22/2018), 2.5 mg (08/05/2018), 2.5 mg (08/12/2018), 2.5 mg (09/02/2018), 2.5 mg (09/09/2018), 2.5 mg (09/16/2018), 2.5 mg (09/30/2018), 2.5 mg (10/07/2018), 2.5 mg (10/14/2018), 2.5 mg (10/31/2018), 2.5 mg (11/07/2018), 2.5 mg (11/14/2018), 2.5 mg (11/28/2018), 2.25 mg (12/05/2018), 2.25 mg (12/12/2018), 2.25 mg (12/26/2018), 2.25 mg (01/02/2019), 2.25 mg (01/09/2019), 2.25 mg (01/23/2019), 2.25 mg (01/30/2019), 2.25 mg (02/06/2019), 2.25 mg (02/20/2019), 2.25 mg (02/27/2019), 2.25 mg (03/06/2019), 2.25 mg (03/20/2019), 2.25 mg (03/27/2019), 2.25 mg (04/03/2019), 2.25 mg (04/17/2019), 2.25 mg (04/24/2019), 2.25 mg (05/01/2019), 2.25 mg (05/15/2019), 2.25 mg (05/23/2019), 2.25 mg (05/30/2019), 2.25 mg (06/13/2019), 2.25 mg (06/20/2019), 2.25 mg (06/27/2019), 2.25 mg (07/11/2019), 2.25 mg (07/18/2019), 2.25 mg (07/25/2019), 2.25 mg (08/08/2019), 2.25 mg (08/15/2019), 2.25 mg (08/22/2019), 2.25 mg (09/05/2019), 2.25 mg  (09/12/2019), 2.25 mg (09/19/2019), 2.25 mg (10/03/2019), 2.25 mg (10/10/2019), 2.25 mg (10/17/2019), 2.25 mg (10/31/2019), 2.25 mg (11/07/2019), 2.25 mg (11/14/2019), 2.25 mg (11/29/2019), 2.25 mg (12/06/2019), 2.25 mg (12/13/2019), 2.25 mg (01/03/2020), 2.25 mg (01/10/2020), 2.25 mg (01/17/2020), 2.25 mg (01/31/2020), 2.25 mg (02/07/2020), 2.25 mg (02/14/2020), 2.25 mg (04/17/2020)  for chemotherapy treatment.         INTERVAL HISTORY:  Ms. Hefferan 79 y.o. female seen for follow-up of multiple myeloma.  She is continuing oral antibiotics.  Denies any new onset pains.  Appetite is 100%.  Energy levels are 50%.  Reports taking potassium supplements without fail.  No fevers or chills reported.  No mandibular pain reported.  REVIEW OF SYSTEMS:  Review of Systems  All other systems reviewed and are negative.    PAST MEDICAL/SURGICAL HISTORY:  Past Medical History:  Diagnosis Date  . Breast cancer ([facility])    left breast/ 2008/ surg/ rad tx  . Coronary artery disease   . Diabetes mellitus    Past Surgical History:  Procedure Laterality Date  . ABDOMINAL HYSTERECTOMY    . BREAST SURGERY    . DEBRIDEMENT MANDIBLE N/A 02/22/2020   Procedure: INCISION AND DRAINAGE WITH DEBRIDEMENT MANDIBLE;  Surgeon: Michael Litter, DMD;  Location: [facility];  Service: Oral Surgery;  Laterality: N/A;  . TOOTH EXTRACTION N/A 02/22/2020   Procedure: DENTAL RESTORATION/EXTRACTIONS;  Surgeon: Michael Litter, DMD;  Location: [facility];  Service: Oral Surgery;  Laterality: N/A;  DENTAL KIT REQUESTED     SOCIAL HISTORY:  Social History   Socioeconomic History  . Marital status: Divorced    Spouse name: Not on file  . Number of children: Not on file  . Years of education: Not on file  .  Highest education level: Not on file  Occupational History  . Not on file  Tobacco Use  . Smoking status: Never Smoker  . Smokeless tobacco: Never Used  Substance and Sexual Activity  . Alcohol use: No  . Drug use: No  . Sexual activity: Yes      Birth control/protection: Surgical  Other Topics Concern  . Not on file  Social History Narrative  . Not on file   Social Determinants of Health   Financial Resource Strain:   . Difficulty of Paying Living Expenses:   Food Insecurity:   . Worried About Charity fundraiser in the Last Year:   . Arboriculturist in the Last Year:   Transportation Needs:   . Film/video editor (Medical):   Marland Kitchen Lack of Transportation (Non-Medical):   Physical Activity:   . Days of Exercise per Week:   . Minutes of Exercise per Session:   Stress:   . Feeling of Stress :   Social Connections:   . Frequency of Communication with Friends and Family:   . Frequency of Social Gatherings with Friends and Family:   . Attends Religious Services:   . Active Member of Clubs or Organizations:   . Attends Archivist Meetings:   Marland Kitchen Marital Status:   Intimate Partner Violence:   . Fear of Current or Ex-Partner:   . Emotionally Abused:   Marland Kitchen Physically Abused:   . Sexually Abused:     FAMILY HISTORY:  Family History  Problem Relation Age of Onset  . Obesity Sister     CURRENT MEDICATIONS:  Outpatient Encounter Medications as of 04/24/2020  Medication Sig Note  . acyclovir (ZOVIRAX) 400 MG tablet TAKE 1 TABLET BY MOUTH TWICE DAILY (Patient not taking: No sig reported)   . aspirin 81 MG tablet Take 81 mg by mouth daily.     . bortezomib IV (VELCADE) 3.5 MG injection Inject 3.5 mg into the vein once a week. weekly    . chlorhexidine (PERIDEX) 0.12 % solution Use as directed 15 mLs in the mouth or throat 2 (two) times daily.    Marland Kitchen glipiZIDE (GLUCOTROL) 5 MG tablet Take 5 mg by mouth daily before breakfast.    . lenalidomide (REVLIMID) 20 MG capsule Take 1 capsule by mouth once daily for 14 days on, and 7 days off of a 21 day cycle.   Marland Kitchen lisinopril-hydrochlorothiazide (PRINZIDE,ZESTORETIC) 20-25 MG tablet Take 1 tablet by mouth every morning.    . magnesium oxide (MAG-OX) 400 (241.3 Mg) MG tablet  Take 1 tablet (400 mg total) by mouth 2 (two) times daily.   . metFORMIN (GLUCOPHAGE) 1000 MG tablet Take 1,000 mg by mouth 2 times daily at 12 noon and 4 pm.     . potassium chloride SA (KLOR-CON) 20 MEQ tablet TAKE (2) TABLETS BY MOUTH THREE TIMES DAILY.   Marland Kitchen dexamethasone (DECADRON) 4 MG tablet Take 10 tablets (40 mg) on days 1, 8, and 15 of chemo. Repeat every 21 days. (Patient not taking: Reported on 04/24/2020) 03/25/2020: On hold   No facility-administered encounter medications on file as of 04/24/2020.    ALLERGIES:  Allergies  Allergen Reactions  . Motrin [Ibuprofen] Rash     PHYSICAL EXAM:  ECOG Performance status: 1  Vitals:   04/24/20 0941  BP: (!) 139/59  Pulse: 63  Resp: 18  Temp: (!) 96.2 F (35.7 C)  SpO2: 100%   Filed Weights   04/24/20 0941  Weight: 150 lb (  68 kg)    Physical Exam Vitals reviewed.  Constitutional:      Appearance: Normal appearance. She is obese.  HENT:     Head: Normocephalic.     Nose: Nose normal.     Mouth/Throat:     Mouth: Mucous membranes are moist.     Pharynx: Oropharynx is clear.  Eyes:     Extraocular Movements: Extraocular movements intact.     Conjunctiva/sclera: Conjunctivae normal.  Cardiovascular:     Rate and Rhythm: Normal rate and regular rhythm.     Pulses: Normal pulses.     Heart sounds: Normal heart sounds.  Pulmonary:     Effort: Pulmonary effort is normal.     Breath sounds: Normal breath sounds.  Abdominal:     General: Bowel sounds are normal.     Palpations: Abdomen is soft.  Musculoskeletal:        General: Normal range of motion.     Cervical back: Normal range of motion.  Skin:    General: Skin is warm and dry.  Neurological:     General: No focal deficit present.     Mental Status: She is alert and oriented to person, place, and time. Mental status is at baseline.  Psychiatric:        Mood and Affect: Mood normal.        Behavior: Behavior normal.        Thought Content: Thought content  normal.        Judgment: Judgment normal.      LABORATORY DATA:  I have reviewed the labs as listed.  CBC    Component Value Date/Time   WBC 3.7 (L) 04/24/2020 0834   RBC 3.19 (L) 04/24/2020 0834   HGB 10.8 (L) 04/24/2020 0834   HCT 34.3 (L) 04/24/2020 0834   PLT 184 04/24/2020 0834   MCV 107.5 (H) 04/24/2020 0834   MCH 33.9 04/24/2020 0834   MCHC 31.5 04/24/2020 0834   RDW 18.1 (H) 04/24/2020 0834   LYMPHSABS 0.8 04/24/2020 0834   MONOABS 0.4 04/24/2020 0834   EOSABS 0.0 04/24/2020 0834   BASOSABS 0.0 04/24/2020 0834   CMP Latest Ref Rng & Units 04/24/2020 04/17/2020 04/10/2020  Glucose 70 - 99 mg/dL 97 101(H) 83  BUN 8 - 23 mg/dL '16 15 8  ' Creatinine 0.44 - 1.00 mg/dL 0.83 0.89 0.82  Sodium 135 - 145 mmol/L 141 141 141  Potassium 3.5 - 5.1 mmol/L 3.5 3.9 3.9  Chloride 98 - 111 mmol/L 103 107 108  CO2 22 - 32 mmol/L '27 26 23  ' Calcium 8.9 - 10.3 mg/dL 10.3 9.5 9.7  Total Protein 6.5 - 8.1 g/dL 6.4(L) 6.4(L) 6.3(L)  Total Bilirubin 0.3 - 1.2 mg/dL 0.6 0.7 0.6  Alkaline Phos 38 - 126 U/L 51 57 56  AST 15 - 41 U/L 8(L) 14(L) 14(L)  ALT 0 - 44 U/L '8 13 11      ' I have reviewed hospital records and scans.   ASSESSMENT & PLAN:   Multiple myeloma not having achieved remission ([facility]) 1 IgA kappa plasma cell myeloma, stage I: -RVD started on 01/09/2018. -RVD on hold since 02/14/2020 due to mandible abscess. -We reviewed myeloma labs from 04/10/2020.  M spike is negative.  Free light chain ratio is 1.36. -She is still continuing oral antibiotics.  I will continue to hold her myeloma treatment at this time. -I will reevaluate her in 4 weeks with repeat labs.  We will decide then to resume the therapy.  2.  Osteomyelitis of the mandible/dental abscess: -She finished IV ceftriaxone on 04/03/2020.  However she is continuing oral antibiotics. -We will hold her bisphosphonates indefinitely.  3.  Severe hypokalemia: -She is taking 40 mEq 3 times a day of K-Dur.  Potassium today is  3.5.  4.  Hypomagnesemia: -Magnesium is 1.4.  She is taking magnesium twice daily.  We will give her magnesium 2 g today.  5.  Macrocytic anemia: -Hemoglobin today improved to 10.8. -This is from combination of myeloma treatments, and renal insufficiency.  She will continue Retacrit units as needed.   Orders placed this encounter:  Orders Placed This Encounter  Procedures  . Protein electrophoresis, serum  . Kappa/lambda light chains  . Lactate dehydrogenase  . CBC with Differential/Platelet  . Comprehensive metabolic panel  . Magnesium  . Immunofixation electrophoresis     Derek Jack, MD  [facility] [phone]

## 2020-04-24 NOTE — Progress Notes (Signed)
   Subjective:    Patient ID: Brittany Archer, female    DOB: February 04, 1941, 79 y.o.   MRN: [id number]  HPI Here for follow up on jaw osteomyelitis. She had a long standing infection and underwent debridement by Dr. [name] on 2/25 after previous outpatient antibiotics.  Culture did not grow anything and she has been on ceftriaxone and oral metronidazole.  Completed 6 weeks with no issues.  No fever, no chills.  No drainage.    Review of Systems  Constitutional: Negative for chills, fever and unexpected weight change.  Gastrointestinal: Negative for diarrhea and nausea.  Skin: Negative for rash.       Objective:   Physical Exam Constitutional:      Appearance: Normal appearance.  Eyes:     General: No scleral icterus. Pulmonary:     Effort: Pulmonary effort is normal.  Neurological:     General: No focal deficit present.     Mental Status: She is alert.  Psychiatric:        Mood and Affect: Mood normal.   SH: no tobacco       Assessment & Plan:

## 2020-04-24 NOTE — Assessment & Plan Note (Signed)
Hgb has been stable.

## 2020-04-24 NOTE — Assessment & Plan Note (Signed)
Resolved clinically so no further treatment indicated.  I discussed the signs of any worsening and to call for concerns.  Has follow up with Dr. [name] already.  rtc prn

## 2020-04-24 NOTE — Patient Instructions (Addendum)
[facility] Discharge Instructions  You were seen today by Dr. Katragadda. He went over your recent lab results. He will see you back in 4 weeks for labs and follow up.   Thank you for choosing [facility] to provide your oncology and hematology care.  To afford each patient quality time with our provider, please arrive at least 15 minutes before your scheduled appointment time.   If you have a lab appointment with the Cancer Center please come in thru the  Main Entrance and check in at the main information desk  You need to re-schedule your appointment should you arrive 10 or more minutes late.  We strive to give you quality time with our providers, and arriving late affects you and other patients whose appointments are after yours.  Also, if you no show three or more times for appointments you may be dismissed from the clinic at the providers discretion.     Again, thank you for choosing [facility].  Our hope is that these requests will decrease the amount of time that you wait before being seen by our physicians.       _____________________________________________________________  Should you have questions after your visit to [facility], please contact our office at [phone] between the hours of 8:00 a.m. and 4:30 p.m.  Voicemails left after 4:00 p.m. will not be returned until the following business day.  For prescription refill requests, have your pharmacy contact our office and allow 72 hours.    Cancer Center Support Programs:   > Cancer Support Group  2nd Tuesday of the month 1pm-2pm, Journey Room    

## 2020-04-27 NOTE — Assessment & Plan Note (Signed)
1 IgA kappa plasma cell myeloma, stage I: -RVD started on 01/09/2018. -RVD on hold since 02/14/2020 due to mandible abscess. -We reviewed myeloma labs from 04/10/2020.  M spike is negative.  Free light chain ratio is 1.36. -She is still continuing oral antibiotics.  I will continue to hold her myeloma treatment at this time. -I will reevaluate her in 4 weeks with repeat labs.  We will decide then to resume the therapy.  2.  Osteomyelitis of the mandible/dental abscess: -She finished IV ceftriaxone on 04/03/2020.  However she is continuing oral antibiotics. -We will hold her bisphosphonates indefinitely.  3.  Severe hypokalemia: -She is taking 40 mEq 3 times a day of K-Dur.  Potassium today is 3.5.  4.  Hypomagnesemia: -Magnesium is 1.4.  She is taking magnesium twice daily.  We will give her magnesium 2 g today.  5.  Macrocytic anemia: -Hemoglobin today improved to 10.8. -This is from combination of myeloma treatments, and renal insufficiency.  She will continue Retacrit units as needed.

## 2020-04-30 ENCOUNTER — Other Ambulatory Visit (HOSPITAL_COMMUNITY): Payer: Self-pay | Admitting: *Deleted

## 2020-04-30 DIAGNOSIS — C9000 Multiple myeloma not having achieved remission: Secondary | ICD-10-CM

## 2020-05-14 ENCOUNTER — Inpatient Hospital Stay (HOSPITAL_COMMUNITY): Payer: Medicare Other | Attending: Hematology

## 2020-05-14 DIAGNOSIS — C9000 Multiple myeloma not having achieved remission: Secondary | ICD-10-CM | POA: Diagnosis present

## 2020-05-14 DIAGNOSIS — Z5112 Encounter for antineoplastic immunotherapy: Secondary | ICD-10-CM | POA: Diagnosis present

## 2020-05-14 LAB — CBC WITH DIFFERENTIAL/PLATELET
Abs Immature Granulocytes: 0.01 10*3/uL (ref 0.00–0.07)
Basophils Absolute: 0 10*3/uL (ref 0.0–0.1)
Basophils Relative: 1 %
Eosinophils Absolute: 0.1 10*3/uL (ref 0.0–0.5)
Eosinophils Relative: 1 %
HCT: 33 % — ABNORMAL LOW (ref 36.0–46.0)
Hemoglobin: 10.5 g/dL — ABNORMAL LOW (ref 12.0–15.0)
Immature Granulocytes: 0 %
Lymphocytes Relative: 28 %
Lymphs Abs: 1.3 10*3/uL (ref 0.7–4.0)
MCH: 33 pg (ref 26.0–34.0)
MCHC: 31.8 g/dL (ref 30.0–36.0)
MCV: 103.8 fL — ABNORMAL HIGH (ref 80.0–100.0)
Monocytes Absolute: 0.4 10*3/uL (ref 0.1–1.0)
Monocytes Relative: 8 %
Neutro Abs: 2.8 10*3/uL (ref 1.7–7.7)
Neutrophils Relative %: 62 %
Platelets: 244 10*3/uL (ref 150–400)
RBC: 3.18 MIL/uL — ABNORMAL LOW (ref 3.87–5.11)
RDW: 14.5 % (ref 11.5–15.5)
WBC: 4.4 10*3/uL (ref 4.0–10.5)
nRBC: 0 % (ref 0.0–0.2)

## 2020-05-14 LAB — COMPREHENSIVE METABOLIC PANEL
ALT: 10 U/L (ref 0–44)
AST: 11 U/L — ABNORMAL LOW (ref 15–41)
Albumin: 3.7 g/dL (ref 3.5–5.0)
Alkaline Phosphatase: 67 U/L (ref 38–126)
Anion gap: 9 (ref 5–15)
BUN: 23 mg/dL (ref 8–23)
CO2: 27 mmol/L (ref 22–32)
Calcium: 10.4 mg/dL — ABNORMAL HIGH (ref 8.9–10.3)
Chloride: 105 mmol/L (ref 98–111)
Creatinine, Ser: 0.94 mg/dL (ref 0.44–1.00)
GFR calc Af Amer: >60 mL/min (ref >60)
GFR calc non Af Amer: 57 mL/min — ABNORMAL LOW (ref >60)
Glucose, Bld: 124 mg/dL — ABNORMAL HIGH (ref 70–99)
Potassium: 3.9 mmol/L (ref 3.5–5.1)
Sodium: 141 mmol/L (ref 135–145)
Total Bilirubin: 0.6 mg/dL (ref 0.3–1.2)
Total Protein: 6.9 g/dL (ref 6.5–8.1)

## 2020-05-14 LAB — MAGNESIUM: Magnesium: 1.6 mg/dL — ABNORMAL LOW (ref 1.7–2.4)

## 2020-05-14 LAB — LACTATE DEHYDROGENASE: LDH: 143 U/L (ref 98–192)

## 2020-05-15 LAB — KAPPA/LAMBDA LIGHT CHAINS
Kappa free light chain: 29 mg/L — ABNORMAL HIGH (ref 3.3–19.4)
Kappa, lambda light chain ratio: 1.73 — ABNORMAL HIGH (ref 0.26–1.65)
Lambda free light chains: 16.8 mg/L (ref 5.7–26.3)

## 2020-05-20 LAB — PROTEIN ELECTROPHORESIS, SERUM
A/G Ratio: 1.1 (ref 0.7–1.7)
Albumin ELP: 3.3 g/dL (ref 2.9–4.4)
Alpha-1-Globulin: 0.2 g/dL (ref 0.0–0.4)
Alpha-2-Globulin: 0.9 g/dL (ref 0.4–1.0)
Beta Globulin: 1.1 g/dL (ref 0.7–1.3)
Gamma Globulin: 0.9 g/dL (ref 0.4–1.8)
Globulin, Total: 3 g/dL (ref 2.2–3.9)
M-Spike, %: 0.5 g/dL — ABNORMAL HIGH
Total Protein ELP: 6.3 g/dL (ref 6.0–8.5)

## 2020-05-20 LAB — IMMUNOFIXATION ELECTROPHORESIS
IgA: 223 mg/dL (ref 64–422)
IgG (Immunoglobin G), Serum: 849 mg/dL (ref 586–1602)
IgM (Immunoglobulin M), Srm: 90 mg/dL (ref 26–217)
Total Protein ELP: 6.2 g/dL (ref 6.0–8.5)

## 2020-05-21 ENCOUNTER — Inpatient Hospital Stay (HOSPITAL_BASED_OUTPATIENT_CLINIC_OR_DEPARTMENT_OTHER): Payer: Medicare Other | Admitting: Hematology

## 2020-05-21 ENCOUNTER — Inpatient Hospital Stay (HOSPITAL_COMMUNITY): Payer: Medicare Other

## 2020-05-21 ENCOUNTER — Other Ambulatory Visit: Payer: Self-pay

## 2020-05-21 VITALS — BP 126/56 | HR 76 | Temp 96.8°F | Resp 18 | Wt 153.8 lb

## 2020-05-21 DIAGNOSIS — C9000 Multiple myeloma not having achieved remission: Secondary | ICD-10-CM | POA: Diagnosis not present

## 2020-05-21 DIAGNOSIS — Z5112 Encounter for antineoplastic immunotherapy: Secondary | ICD-10-CM | POA: Diagnosis not present

## 2020-05-21 MED ORDER — DEXAMETHASONE 4 MG PO TABS
40.0000 mg | ORAL_TABLET | Freq: Once | ORAL | Status: AC
  Administered 2020-05-21: 40 mg via ORAL
  Filled 2020-05-21: qty 10

## 2020-05-21 MED ORDER — BORTEZOMIB CHEMO SQ INJECTION 3.5 MG (2.5MG/ML)
1.3000 mg/m2 | Freq: Once | INTRAMUSCULAR | Status: AC
  Administered 2020-05-21: 2.25 mg via SUBCUTANEOUS
  Filled 2020-05-21: qty 0.9

## 2020-05-21 MED ORDER — PROCHLORPERAZINE MALEATE 10 MG PO TABS
10.0000 mg | ORAL_TABLET | Freq: Once | ORAL | Status: AC
  Administered 2020-05-21: 10 mg via ORAL
  Filled 2020-05-21: qty 1

## 2020-05-21 NOTE — Patient Instructions (Signed)
[facility] Discharge Instructions  You were seen today by Dr. Delton Coombes. He went over your recent results. We will resume your treatment today. Please restart your Revlimid. Your potassium today was very good. He will see you back in for labs and follow up.   Thank you for choosing [facility] to provide your oncology and hematology care.  To afford each patient quality time with our provider, please arrive at least 15 minutes before your scheduled appointment time.   If you have a lab appointment with the [facility] please come in thru the  Main Entrance and check in at the main information desk  You need to re-schedule your appointment should you arrive 10 or more minutes late.  We strive to give you quality time with our providers, and arriving late affects you and other patients whose appointments are after yours.  Also, if you no show three or more times for appointments you may be dismissed from the clinic at the providers discretion.     Again, thank you for choosing [name].  Our hope is that these requests will decrease the amount of time that you wait before being seen by our physicians.       _____________________________________________________________  Should you have questions after your visit to [facility], please contact our office at [phone] between the hours of 8:00 a.m. and 4:30 p.m.  Voicemails left after 4:00 p.m. will not be returned until the following business day.  For prescription refill requests, have your pharmacy contact our office and allow 72 hours.    Cancer Center Support Programs:   > Cancer Support Group  2nd Tuesday of the month 1pm-2pm, Journey Room

## 2020-05-21 NOTE — Progress Notes (Signed)
1210 Labs reviewed with and pt seen by Dr. Delton Coombes and pt approved for Velcade injection today per MD                                  Yves Dill Halley tolerated Velcade injection well without complaints or incident. Pt discharged self ambulatory in satisfactory condition accompanied by family member

## 2020-05-21 NOTE — Patient Instructions (Signed)
[facility] Discharge Instructions for Patients Receiving Chemotherapy   Beginning January 23rd 2017 lab work for the Cancer Center will be done in the  Main lab at [facility] on 1st floor. If you have a lab appointment with the Cancer Center please come in thru the  Main Entrance and check in at the main information desk   Today you received the following chemotherapy agents Velcade injection. Follow-up as scheduled. Call clinic for any questions or concerns  To help prevent nausea and vomiting after your treatment, we encourage you to take your nausea medication   If you develop nausea and vomiting, or diarrhea that is not controlled by your medication, call the clinic.  The clinic phone number is [phone]. Office hours are Monday-Friday 8:30am-5:00pm.  BELOW ARE SYMPTOMS THAT SHOULD BE REPORTED IMMEDIATELY:  *FEVER GREATER THAN 101.0 F  *CHILLS WITH OR WITHOUT FEVER  NAUSEA AND VOMITING THAT IS NOT CONTROLLED WITH YOUR NAUSEA MEDICATION  *UNUSUAL SHORTNESS OF BREATH  *UNUSUAL BRUISING OR BLEEDING  TENDERNESS IN MOUTH AND THROAT WITH OR WITHOUT PRESENCE OF ULCERS  *URINARY PROBLEMS  *BOWEL PROBLEMS  UNUSUAL RASH Items with * indicate a potential emergency and should be followed up as soon as possible. If you have an emergency after office hours please contact your primary care physician or go to the nearest emergency department.  Please call the clinic during office hours if you have any questions or concerns.   You may also contact the Patient Navigator at [phone] should you have any questions or need assistance in obtaining follow up care.      Resources For Cancer Patients and their Caregivers ? American Cancer Society: Can assist with transportation, wigs, general needs, runs Look Good Feel Better.        1-888-227-6333 ? Cancer Care: Provides financial assistance, online support groups, medication/co-pay assistance.   1-800-813-HOPE (4673) ? [facility] Assists [place] cancer patients and their families through emotional , educational and financial support.  336-427-4357 ? [place] DSS Where to apply for food stamps, Medicaid and utility assistance. 336-342-1394 ? [facility]: Transportation to medical appointments. 336-347-2287 ? Social Security Administration: May apply for disability if have a Stage IV cancer. 336-342-7796 1-800-772-1213 ? [place] Aging, Disability and Transit Services: Assists with nutrition, care and transit needs. 336-349-2343         

## 2020-05-21 NOTE — Progress Notes (Signed)
Brittany Archer, [place] [address]   CLINIC:  Medical Oncology/Hematology  PCP:  Brittany Archer [address] [place] [address]  [phone]  REASON FOR VISIT:  Follow-up for multiple myeloma  INTERVAL HISTORY:  Brittany Archer 79 y.o. female returns for routine follow-up for her multiple myeloma. Brittany Archer was last seen on 04/24/2020.  She has some pain in her jaw, but she denies the use of pain medication, prescribed or OTC, to treat.  She has completed oral antibiotics.  Appetite and energy levels are 75%.  Denies any fevers or chills.  REVIEW OF SYSTEMS:  Review of Systems  Constitutional: Positive for appetite change (mildly decreased) and fatigue (mild). Negative for chills, diaphoresis and fever.  HENT:   Negative for mouth sores, sore throat and trouble swallowing.   Eyes: Negative for eye problems.  Respiratory: Negative for cough, shortness of breath and wheezing.   Cardiovascular: Negative for chest pain, leg swelling and palpitations.  Gastrointestinal: Negative for abdominal pain, constipation, diarrhea, nausea and vomiting.  Genitourinary: Negative for bladder incontinence, dysuria and frequency.   Musculoskeletal: Negative for arthralgias, back pain and myalgias.  Skin: Negative for rash.  Neurological: Negative for dizziness, extremity weakness, headaches and numbness.  Hematological: Does not bruise/bleed easily.  Psychiatric/Behavioral: Negative for depression and sleep disturbance. The patient is not nervous/anxious.     PAST MEDICAL/SURGICAL HISTORY:  Past Medical History:  Diagnosis Date  . Breast cancer ([place])    left breast/ 2008/ surg/ rad tx  . Coronary artery disease   . Diabetes mellitus    Past Surgical History:  Procedure Laterality Date  . ABDOMINAL HYSTERECTOMY    . BREAST SURGERY    . DEBRIDEMENT MANDIBLE N/A 02/22/2020   Procedure: INCISION AND DRAINAGE WITH DEBRIDEMENT MANDIBLE;  Surgeon:  Michael Litter, DMD;  Location: [facility];  Service: Oral Surgery;  Laterality: N/A;  . TOOTH EXTRACTION N/A 02/22/2020   Procedure: DENTAL RESTORATION/EXTRACTIONS;  Surgeon: Michael Litter, DMD;  Location: [facility];  Service: Oral Surgery;  Laterality: N/A;  DENTAL KIT REQUESTED    SOCIAL HISTORY:  Social History   Socioeconomic History  . Marital status: Divorced    Spouse name: Not on file  . Number of children: Not on file  . Years of education: Not on file  . Highest education level: Not on file  Occupational History  . Not on file  Tobacco Use  . Smoking status: Never Smoker  . Smokeless tobacco: Never Used  Substance and Sexual Activity  . Alcohol use: No  . Drug use: No  . Sexual activity: Yes    Birth control/protection: Surgical  Other Topics Concern  . Not on file  Social History Narrative  . Not on file   Social Determinants of Health   Financial Resource Strain:   . Difficulty of Paying Living Expenses:   Food Insecurity:   . Worried About Charity fundraiser in the Last Year:   . Arboriculturist in the Last Year:   Transportation Needs:   . Film/video editor (Medical):   Marland Kitchen Lack of Transportation (Non-Medical):   Physical Activity:   . Days of Exercise per Week:   . Minutes of Exercise per Session:   Stress:   . Feeling of Stress :   Social Connections:   . Frequency of Communication with Friends and Family:   . Frequency of Social Gatherings with Friends and Family:   .  Attends Religious Services:   . Active Member of Clubs or Organizations:   . Attends Archivist Meetings:   Marland Kitchen Marital Status:   Intimate Partner Violence:   . Fear of Current or Ex-Partner:   . Emotionally Abused:   Marland Kitchen Physically Abused:   . Sexually Abused:     FAMILY HISTORY:  Family History  Problem Relation Age of Onset  . Obesity Sister     CURRENT MEDICATIONS:  Current Outpatient Medications  Medication Sig Dispense Refill  . acyclovir (ZOVIRAX) 400 MG tablet  TAKE 1 TABLET BY MOUTH TWICE DAILY 60 tablet 11  . aspirin 81 MG tablet Take 81 mg by mouth daily.      . bortezomib IV (VELCADE) 3.5 MG injection Inject 3.5 mg into the vein once a week. weekly     . chlorhexidine (PERIDEX) 0.12 % solution Use as directed 15 mLs in the mouth or throat 2 (two) times daily.     Marland Kitchen dexamethasone (DECADRON) 4 MG tablet Take 10 tablets (40 mg) on days 1, 8, and 15 of chemo. Repeat every 21 days. 30 tablet 3  . glipiZIDE (GLUCOTROL) 5 MG tablet Take 5 mg by mouth daily before breakfast.     . lenalidomide (REVLIMID) 20 MG capsule Take 1 capsule by mouth once daily for 14 days on, and 7 days off of a 21 day cycle. 14 capsule 0  . lisinopril-hydrochlorothiazide (PRINZIDE,ZESTORETIC) 20-25 MG tablet Take 1 tablet by mouth every morning.     . magnesium oxide (MAG-OX) 400 (241.3 Mg) MG tablet Take 1 tablet (400 mg total) by mouth 2 (two) times daily. 60 tablet 0  . metFORMIN (GLUCOPHAGE) 1000 MG tablet Take 1,000 mg by mouth 2 times daily at 12 noon and 4 pm.      . potassium chloride SA (KLOR-CON) 20 MEQ tablet TAKE (2) TABLETS BY MOUTH THREE TIMES DAILY. 168 tablet 11   No current facility-administered medications for this visit.    ALLERGIES:  Allergies  Allergen Reactions  . Motrin [Ibuprofen] Rash    PHYSICAL EXAM:  Performance status (ECOG): 1 - Symptomatic but completely ambulatory  Vitals:   05/21/20 1158  BP: (!) 126/56  Pulse: 76  Resp: 18  Temp: (!) 96.8 F (36 C)  SpO2: 99%   Wt Readings from Last 3 Encounters:  05/21/20 153 lb 12.8 oz (69.8 kg)  04/24/20 152 lb (68.9 kg)  04/24/20 150 lb (68 kg)   Physical Exam Constitutional:      General: She is not in acute distress.    Appearance: Normal appearance. She is normal weight. She is not ill-appearing.  HENT:     Nose: No congestion or rhinorrhea.     Mouth/Throat:     Mouth: Mucous membranes are moist.     Pharynx: No oropharyngeal exudate or posterior oropharyngeal erythema.  Eyes:       Extraocular Movements: Extraocular movements intact.     Pupils: Pupils are equal, round, and reactive to light.  Cardiovascular:     Rate and Rhythm: Normal rate and regular rhythm.     Pulses: Normal pulses.     Heart sounds: Normal heart sounds. No murmur. No friction rub. No gallop.   Pulmonary:     Effort: Pulmonary effort is normal.     Breath sounds: Normal breath sounds. No wheezing, rhonchi or rales.  Abdominal:     Palpations: There is no mass.     Tenderness: There is no abdominal tenderness. There is  no guarding.  Musculoskeletal:        General: No swelling or tenderness.     Right lower leg: No edema.     Left lower leg: No edema.  Skin:    Findings: No bruising or erythema.  Neurological:     Mental Status: She is alert and oriented to person, place, and time.     Sensory: No sensory deficit.  Psychiatric:        Mood and Affect: Mood normal.        Behavior: Behavior normal.        Thought Content: Thought content normal.        Judgment: Judgment normal.     LABORATORY DATA:  I have reviewed the labs as listed.  CBC Latest Ref Rng & Units 05/14/2020 04/24/2020 04/17/2020  WBC 4.0 - 10.5 K/uL 4.4 3.7(L) 3.5(L)  Hemoglobin 12.0 - 15.0 g/dL 10.5(L) 10.8(L) 9.8(L)  Hematocrit 36.0 - 46.0 % 33.0(L) 34.3(L) 32.1(L)  Platelets 150 - 400 K/uL 244 184 209   CMP Latest Ref Rng & Units 05/14/2020 04/24/2020 04/17/2020  Glucose 70 - 99 mg/dL 124(H) 97 101(H)  BUN 8 - 23 mg/dL '23 16 15  ' Creatinine 0.44 - 1.00 mg/dL 0.94 0.83 0.89  Sodium 135 - 145 mmol/L 141 141 141  Potassium 3.5 - 5.1 mmol/L 3.9 3.5 3.9  Chloride 98 - 111 mmol/L 105 103 107  CO2 22 - 32 mmol/L '27 27 26  ' Calcium 8.9 - 10.3 mg/dL 10.4(H) 10.3 9.5  Total Protein 6.5 - 8.1 g/dL 6.9 6.4(L) 6.4(L)  Total Bilirubin 0.3 - 1.2 mg/dL 0.6 0.6 0.7  Alkaline Phos 38 - 126 U/L 67 51 57  AST 15 - 41 U/L 11(L) 8(L) 14(L)  ALT 0 - 44 U/L '10 8 13       ' Component Value Date/Time   RBC 3.18 (L) 05/14/2020 1351    MCV 103.8 (H) 05/14/2020 1351   MCH 33.0 05/14/2020 1351   MCHC 31.8 05/14/2020 1351   RDW 14.5 05/14/2020 1351   LYMPHSABS 1.3 05/14/2020 1351   MONOABS 0.4 05/14/2020 1351   EOSABS 0.1 05/14/2020 1351   BASOSABS 0.0 05/14/2020 1351   Lab Results  Component Value Date   TOTALPROTELP 6.3 05/14/2020   TOTALPROTELP 6.2 05/14/2020   ALBUMINELP 3.3 05/14/2020   A1GS 0.2 05/14/2020   A2GS 0.9 05/14/2020   BETS 1.1 05/14/2020   GAMS 0.9 05/14/2020   MSPIKE 0.5 (H) 05/14/2020   SPEI Comment 05/14/2020   Lab Results  Component Value Date   KPAFRELGTCHN 29.0 (H) 05/14/2020   LAMBDASER 16.8 05/14/2020   KAPLAMBRATIO 1.73 (H) 05/14/2020    DIAGNOSTIC IMAGING:  I have independently reviewed the scans and discussed with the patient.  ASSESSMENT & PLAN:  1.  IgA kappa plasma cell myeloma, stage I: -RVD started on 01/09/2018, on hold since 02/14/2020 due to mandible abscess. -We reviewed myeloma panel from 05/14/2020.  M spike has become positive at 0.5 g.  Deviously this was undetectable.  Calcium has gone up to 10.4.  Kappa light chains elevated at 29.  Kappa to lambda ratio is 1.73.  Immunofixation was unremarkable. -Based on the progression, I have recommended restarting her treatments.  We will start her Velcade today.  She will start Revlimid 3 weeks on 1 week off.  We are giving dexamethasone 40 mg on days of Velcade injection in our clinic.  She gets too confused with lots of medications.  2.  Severe hypokalemia: -She is continuing  40 mEq potassium 3 times a day.  Potassium today is 3.9.  3.  Osteomyelitis of the right mandible/dental abscess: -Finished IV ceftriaxone on 04/03/2020.  Finished oral antibiotics. -We will hold bisphosphonates indefinitely.  4.  Hypomagnesemia: -Magnesium is slightly low.  She will continue magnesium twice daily at home.  5.  Microcytic anemia: -This is from combination of myeloma treatments and renal insufficiency.  She will receive Retacrit as  needed.    Orders placed this encounter:  No orders of the defined types were placed in this encounter.    Derek Jack, MD, 05/21/20 12:00 PM  [place] [phone]   I, Jacqualyn Posey, am acting as a scribe for Dr. Sanda Linger.  I, Derek Jack MD, have reviewed the above documentation for accuracy and completeness, and I agree with the above.

## 2020-05-28 ENCOUNTER — Inpatient Hospital Stay (HOSPITAL_COMMUNITY): Payer: Medicare Other | Attending: Hematology

## 2020-05-28 ENCOUNTER — Encounter (HOSPITAL_COMMUNITY): Payer: Self-pay

## 2020-05-28 ENCOUNTER — Other Ambulatory Visit: Payer: Self-pay

## 2020-05-28 ENCOUNTER — Inpatient Hospital Stay (HOSPITAL_COMMUNITY): Payer: Medicare Other

## 2020-05-28 VITALS — BP 129/57 | HR 64 | Temp 98.6°F | Resp 18 | Wt 156.0 lb

## 2020-05-28 DIAGNOSIS — Z7982 Long term (current) use of aspirin: Secondary | ICD-10-CM | POA: Insufficient documentation

## 2020-05-28 DIAGNOSIS — E538 Deficiency of other specified B group vitamins: Secondary | ICD-10-CM | POA: Diagnosis not present

## 2020-05-28 DIAGNOSIS — Z7984 Long term (current) use of oral hypoglycemic drugs: Secondary | ICD-10-CM | POA: Insufficient documentation

## 2020-05-28 DIAGNOSIS — C9000 Multiple myeloma not having achieved remission: Secondary | ICD-10-CM

## 2020-05-28 DIAGNOSIS — Z90710 Acquired absence of both cervix and uterus: Secondary | ICD-10-CM | POA: Diagnosis not present

## 2020-05-28 DIAGNOSIS — Z7952 Long term (current) use of systemic steroids: Secondary | ICD-10-CM | POA: Insufficient documentation

## 2020-05-28 DIAGNOSIS — Z79899 Other long term (current) drug therapy: Secondary | ICD-10-CM | POA: Insufficient documentation

## 2020-05-28 DIAGNOSIS — E119 Type 2 diabetes mellitus without complications: Secondary | ICD-10-CM | POA: Insufficient documentation

## 2020-05-28 DIAGNOSIS — Z5112 Encounter for antineoplastic immunotherapy: Secondary | ICD-10-CM | POA: Diagnosis not present

## 2020-05-28 DIAGNOSIS — Z853 Personal history of malignant neoplasm of breast: Secondary | ICD-10-CM | POA: Diagnosis not present

## 2020-05-28 DIAGNOSIS — E876 Hypokalemia: Secondary | ICD-10-CM | POA: Diagnosis not present

## 2020-05-28 DIAGNOSIS — D509 Iron deficiency anemia, unspecified: Secondary | ICD-10-CM | POA: Diagnosis not present

## 2020-05-28 DIAGNOSIS — I2510 Atherosclerotic heart disease of native coronary artery without angina pectoris: Secondary | ICD-10-CM | POA: Insufficient documentation

## 2020-05-28 LAB — CBC WITH DIFFERENTIAL/PLATELET
Abs Immature Granulocytes: 0.02 10*3/uL (ref 0.00–0.07)
Basophils Absolute: 0 10*3/uL (ref 0.0–0.1)
Basophils Relative: 0 %
Eosinophils Absolute: 0.1 10*3/uL (ref 0.0–0.5)
Eosinophils Relative: 3 %
HCT: 34.5 % — ABNORMAL LOW (ref 36.0–46.0)
Hemoglobin: 11 g/dL — ABNORMAL LOW (ref 12.0–15.0)
Immature Granulocytes: 1 %
Lymphocytes Relative: 30 %
Lymphs Abs: 1.1 10*3/uL (ref 0.7–4.0)
MCH: 32.9 pg (ref 26.0–34.0)
MCHC: 31.9 g/dL (ref 30.0–36.0)
MCV: 103.3 fL — ABNORMAL HIGH (ref 80.0–100.0)
Monocytes Absolute: 0.3 10*3/uL (ref 0.1–1.0)
Monocytes Relative: 7 %
Neutro Abs: 2.2 10*3/uL (ref 1.7–7.7)
Neutrophils Relative %: 59 %
Platelets: 205 10*3/uL (ref 150–400)
RBC: 3.34 MIL/uL — ABNORMAL LOW (ref 3.87–5.11)
RDW: 14.1 % (ref 11.5–15.5)
WBC: 3.7 10*3/uL — ABNORMAL LOW (ref 4.0–10.5)
nRBC: 0 % (ref 0.0–0.2)

## 2020-05-28 LAB — COMPREHENSIVE METABOLIC PANEL
ALT: 9 U/L (ref 0–44)
AST: 12 U/L — ABNORMAL LOW (ref 15–41)
Albumin: 3.8 g/dL (ref 3.5–5.0)
Alkaline Phosphatase: 55 U/L (ref 38–126)
Anion gap: 12 (ref 5–15)
BUN: 22 mg/dL (ref 8–23)
CO2: 27 mmol/L (ref 22–32)
Calcium: 9.9 mg/dL (ref 8.9–10.3)
Chloride: 101 mmol/L (ref 98–111)
Creatinine, Ser: 0.91 mg/dL (ref 0.44–1.00)
GFR calc Af Amer: >60 mL/min (ref >60)
GFR calc non Af Amer: 60 mL/min — ABNORMAL LOW (ref >60)
Glucose, Bld: 90 mg/dL (ref 70–99)
Potassium: 3.3 mmol/L — ABNORMAL LOW (ref 3.5–5.1)
Sodium: 140 mmol/L (ref 135–145)
Total Bilirubin: 0.6 mg/dL (ref 0.3–1.2)
Total Protein: 6.9 g/dL (ref 6.5–8.1)

## 2020-05-28 MED ORDER — CYANOCOBALAMIN 1000 MCG/ML IJ SOLN
INTRAMUSCULAR | Status: AC
  Filled 2020-05-28: qty 1

## 2020-05-28 MED ORDER — PROCHLORPERAZINE MALEATE 10 MG PO TABS
10.0000 mg | ORAL_TABLET | Freq: Once | ORAL | Status: AC
  Administered 2020-05-28: 10 mg via ORAL

## 2020-05-28 MED ORDER — BORTEZOMIB CHEMO SQ INJECTION 3.5 MG (2.5MG/ML)
1.3000 mg/m2 | Freq: Once | INTRAMUSCULAR | Status: AC
  Administered 2020-05-28: 2.25 mg via SUBCUTANEOUS
  Filled 2020-05-28: qty 0.9

## 2020-05-28 MED ORDER — PROCHLORPERAZINE MALEATE 10 MG PO TABS
ORAL_TABLET | ORAL | Status: AC
  Filled 2020-05-28: qty 1

## 2020-05-28 MED ORDER — CYANOCOBALAMIN 1000 MCG/ML IJ SOLN
1000.0000 ug | Freq: Once | INTRAMUSCULAR | Status: AC
  Administered 2020-05-28: 1000 ug via INTRAMUSCULAR

## 2020-05-28 NOTE — Progress Notes (Signed)
Labs reviewed and pt assessed - VSS.  She denies any c/o n/v/d, peripheral neuropathy; denies fatigue and reports good appetite.  She states she took her dexamethasone as prescribed at home today pta.

## 2020-06-04 ENCOUNTER — Inpatient Hospital Stay (HOSPITAL_COMMUNITY): Payer: Medicare Other

## 2020-06-04 ENCOUNTER — Other Ambulatory Visit (HOSPITAL_COMMUNITY): Payer: Self-pay | Admitting: Nurse Practitioner

## 2020-06-04 ENCOUNTER — Other Ambulatory Visit: Payer: Self-pay

## 2020-06-04 ENCOUNTER — Encounter (HOSPITAL_COMMUNITY): Payer: Self-pay

## 2020-06-04 VITALS — BP 124/49 | HR 68 | Temp 98.2°F | Resp 18

## 2020-06-04 DIAGNOSIS — C9000 Multiple myeloma not having achieved remission: Secondary | ICD-10-CM

## 2020-06-04 DIAGNOSIS — Z5112 Encounter for antineoplastic immunotherapy: Secondary | ICD-10-CM | POA: Diagnosis not present

## 2020-06-04 DIAGNOSIS — E538 Deficiency of other specified B group vitamins: Secondary | ICD-10-CM

## 2020-06-04 LAB — COMPREHENSIVE METABOLIC PANEL
ALT: 11 U/L (ref 0–44)
AST: 11 U/L — ABNORMAL LOW (ref 15–41)
Albumin: 3.5 g/dL (ref 3.5–5.0)
Alkaline Phosphatase: 61 U/L (ref 38–126)
Anion gap: 11 (ref 5–15)
BUN: 30 mg/dL — ABNORMAL HIGH (ref 8–23)
CO2: 24 mmol/L (ref 22–32)
Calcium: 9.3 mg/dL (ref 8.9–10.3)
Chloride: 103 mmol/L (ref 98–111)
Creatinine, Ser: 0.9 mg/dL (ref 0.44–1.00)
GFR calc Af Amer: >60 mL/min (ref >60)
GFR calc non Af Amer: >60 mL/min (ref >60)
Glucose, Bld: 111 mg/dL — ABNORMAL HIGH (ref 70–99)
Potassium: 3.4 mmol/L — ABNORMAL LOW (ref 3.5–5.1)
Sodium: 138 mmol/L (ref 135–145)
Total Bilirubin: 0.3 mg/dL (ref 0.3–1.2)
Total Protein: 7 g/dL (ref 6.5–8.1)

## 2020-06-04 LAB — CBC WITH DIFFERENTIAL/PLATELET
Abs Immature Granulocytes: 0.02 10*3/uL (ref 0.00–0.07)
Basophils Absolute: 0 10*3/uL (ref 0.0–0.1)
Basophils Relative: 0 %
Eosinophils Absolute: 0.1 10*3/uL (ref 0.0–0.5)
Eosinophils Relative: 1 %
HCT: 31.7 % — ABNORMAL LOW (ref 36.0–46.0)
Hemoglobin: 10.3 g/dL — ABNORMAL LOW (ref 12.0–15.0)
Immature Granulocytes: 0 %
Lymphocytes Relative: 12 %
Lymphs Abs: 0.9 10*3/uL (ref 0.7–4.0)
MCH: 32.9 pg (ref 26.0–34.0)
MCHC: 32.5 g/dL (ref 30.0–36.0)
MCV: 101.3 fL — ABNORMAL HIGH (ref 80.0–100.0)
Monocytes Absolute: 0.8 10*3/uL (ref 0.1–1.0)
Monocytes Relative: 11 %
Neutro Abs: 5.8 10*3/uL (ref 1.7–7.7)
Neutrophils Relative %: 76 %
Platelets: 165 10*3/uL (ref 150–400)
RBC: 3.13 MIL/uL — ABNORMAL LOW (ref 3.87–5.11)
RDW: 14.2 % (ref 11.5–15.5)
WBC: 7.7 10*3/uL (ref 4.0–10.5)
nRBC: 0 % (ref 0.0–0.2)

## 2020-06-04 LAB — MAGNESIUM: Magnesium: 1.3 mg/dL — ABNORMAL LOW (ref 1.7–2.4)

## 2020-06-04 LAB — LACTATE DEHYDROGENASE: LDH: 115 U/L (ref 98–192)

## 2020-06-04 MED ORDER — MAGNESIUM SULFATE 2 GM/50ML IV SOLN
2.0000 g | Freq: Once | INTRAVENOUS | Status: AC
  Administered 2020-06-04: 2 g via INTRAVENOUS
  Filled 2020-06-04: qty 50

## 2020-06-04 MED ORDER — SODIUM CHLORIDE 0.9 % IV SOLN: Freq: Once | INTRAVENOUS | Status: AC

## 2020-06-04 MED ORDER — EPOETIN ALFA-EPBX 10000 UNIT/ML IJ SOLN: 20000.0000 [IU] | Freq: Once | INTRAMUSCULAR | Status: DC

## 2020-06-04 MED ORDER — PROCHLORPERAZINE MALEATE 10 MG PO TABS
10.0000 mg | ORAL_TABLET | Freq: Once | ORAL | Status: AC
  Administered 2020-06-04: 10 mg via ORAL
  Filled 2020-06-04: qty 1

## 2020-06-04 MED ORDER — BORTEZOMIB CHEMO SQ INJECTION 3.5 MG (2.5MG/ML)
1.3000 mg/m2 | Freq: Once | INTRAMUSCULAR | Status: AC
  Administered 2020-06-04: 2.25 mg via SUBCUTANEOUS
  Filled 2020-06-04: qty 0.9

## 2020-06-04 MED ORDER — DEXAMETHASONE 4 MG PO TABS
40.0000 mg | ORAL_TABLET | Freq: Once | ORAL | Status: AC
  Administered 2020-06-04: 40 mg via ORAL
  Filled 2020-06-04: qty 10

## 2020-06-04 NOTE — Patient Instructions (Addendum)
[place] at [facility] Discharge Instructions [place] Discharge Instructions for Patients Receiving Chemotherapy   Beginning January 23rd 2017 lab work for the [facility] will be done in the  Main lab at [facility] on 1st floor. If you have a lab appointment with the [facility] please come in thru the  Main Entrance and check in at the main information desk   Today you received the following chemotherapy agents Velcade injection as well as Magnesium infusion. Follow-up as scheduled  To help prevent nausea and vomiting after your treatment, we encourage you to take your nausea medication   If you develop nausea and vomiting, or diarrhea that is not controlled by your medication, call the clinic.  The clinic phone number is [phone]. Office hours are Monday-Friday 8:30am-5:00pm.  BELOW ARE SYMPTOMS THAT SHOULD BE REPORTED IMMEDIATELY:  *FEVER GREATER THAN 101.0 F  *CHILLS WITH OR WITHOUT FEVER  NAUSEA AND VOMITING THAT IS NOT CONTROLLED WITH YOUR NAUSEA MEDICATION  *UNUSUAL SHORTNESS OF BREATH  *UNUSUAL BRUISING OR BLEEDING  TENDERNESS IN MOUTH AND THROAT WITH OR WITHOUT PRESENCE OF ULCERS  *URINARY PROBLEMS  *BOWEL PROBLEMS  UNUSUAL RASH Items with * indicate a potential emergency and should be followed up as soon as possible. If you have an emergency after office hours please contact your primary care physician or go to the nearest emergency department.  Please call the clinic during office hours if you have any questions or concerns.   You may also contact the Patient Navigator at [phone] should you have any questions or need assistance in obtaining follow up care.      Resources For Cancer Patients and their Caregivers ? American Cancer Society: Can assist with transportation, wigs, general needs, runs Look Good Feel Better.        207-255-7681 ? Cancer Care: Provides financial assistance, online  support groups, medication/co-pay assistance.  1-800-813-HOPE 318-327-7916) ? [place] Assists [place] cancer patients and their families through emotional , educational and financial support.  269-561-6383 ? [place] DSS Where to apply for food stamps, Medicaid and utility assistance. 762-792-9666 ? [employer]: Transportation to medical appointments. 7542375589 ? Social Security Administration: May apply for disability if have a Stage IV cancer. (484)846-4897 (682)123-1227 ? [employer], Disability and Transit Services: Assists with nutrition, care and transit needs. [phone]           Thank you for choosing [place] at [facility] to provide your oncology and hematology care.  To afford each patient quality time with our provider, please arrive at least 15 minutes before your scheduled appointment time.   If you have a lab appointment with the [facility] please come in thru the Main Entrance and check in at the main information desk.  You need to re-schedule your appointment should you arrive 10 or more minutes late.  We strive to give you quality time with our providers, and arriving late affects you and other patients whose appointments are after yours.  Also, if you no show three or more times for appointments you may be dismissed from the clinic at the providers discretion.     Again, thank you for choosing [facility].  Our hope is that these requests will decrease the amount of time that you wait before being seen by our physicians.       _____________________________________________________________  Should you have questions after your visit  to [facility], please contact our office at [phone] between the hours of 8:00 a.m. and 4:30 p.m.  Voicemails left after 4:00 p.m. will not be returned until the following business day.  For prescription refill requests, have your pharmacy  contact our office and allow 72 hours.    Due to Covid, you will need to wear a mask upon entering the hospital. If you do not have a mask, a mask will be given to you at the Main Entrance upon arrival. For doctor visits, patients may have 1 support person with them. For treatment visits, patients can not have anyone with them due to social distancing guidelines and our immunocompromised population.

## 2020-06-04 NOTE — Progress Notes (Signed)
06/04/20  Hold Retacrit today with Hgb 10.3  T.O. Randi Lockamy NP/Michele Kerlin Ronnald Ramp, PharmD

## 2020-06-04 NOTE — Progress Notes (Signed)
0927 Labs reviewed with RLockamy NP and Brittany Archer approved for Velcade and injection as well as Magnesium 2 grams IV and NS 250 ml over 30 minutes today per NP                                             Brittany Archer tolerated Velcade injection and Magnesium infusion and hydration well without complaints or incident. Peripheral IV site checked with positive blood return noted prior to and after infusion. VSS upon discharge. Brittany Archer discharged self ambulatory in satisfactory condition accompanied by family member

## 2020-06-05 LAB — KAPPA/LAMBDA LIGHT CHAINS
Kappa free light chain: 39.7 mg/L — ABNORMAL HIGH (ref 3.3–19.4)
Kappa, lambda light chain ratio: 1.56 (ref 0.26–1.65)
Lambda free light chains: 25.4 mg/L (ref 5.7–26.3)

## 2020-06-05 LAB — PROTEIN ELECTROPHORESIS, SERUM
A/G Ratio: 0.9 (ref 0.7–1.7)
Albumin ELP: 3 g/dL (ref 2.9–4.4)
Alpha-1-Globulin: 0.2 g/dL (ref 0.0–0.4)
Alpha-2-Globulin: 1 g/dL (ref 0.4–1.0)
Beta Globulin: 1.2 g/dL (ref 0.7–1.3)
Gamma Globulin: 1 g/dL (ref 0.4–1.8)
Globulin, Total: 3.4 g/dL (ref 2.2–3.9)
M-Spike, %: 0.6 g/dL — ABNORMAL HIGH
Total Protein ELP: 6.4 g/dL (ref 6.0–8.5)

## 2020-06-10 LAB — IMMUNOFIXATION ELECTROPHORESIS
IgA: 296 mg/dL (ref 64–422)
IgG (Immunoglobin G), Serum: 838 mg/dL (ref 586–1602)
IgM (Immunoglobulin M), Srm: 116 mg/dL (ref 26–217)
Total Protein ELP: 6.3 g/dL (ref 6.0–8.5)

## 2020-06-11 ENCOUNTER — Ambulatory Visit (HOSPITAL_COMMUNITY): Payer: Medicare Other

## 2020-06-12 ENCOUNTER — Inpatient Hospital Stay (HOSPITAL_COMMUNITY): Payer: Medicare Other

## 2020-06-12 ENCOUNTER — Inpatient Hospital Stay (HOSPITAL_BASED_OUTPATIENT_CLINIC_OR_DEPARTMENT_OTHER): Payer: Medicare Other | Admitting: Hematology

## 2020-06-12 ENCOUNTER — Other Ambulatory Visit: Payer: Self-pay

## 2020-06-12 VITALS — BP 130/88 | HR 62 | Temp 98.3°F | Resp 18 | Wt 160.0 lb

## 2020-06-12 DIAGNOSIS — Z5112 Encounter for antineoplastic immunotherapy: Secondary | ICD-10-CM | POA: Diagnosis not present

## 2020-06-12 DIAGNOSIS — C9000 Multiple myeloma not having achieved remission: Secondary | ICD-10-CM

## 2020-06-12 LAB — CBC WITH DIFFERENTIAL/PLATELET
Abs Immature Granulocytes: 0.01 10*3/uL (ref 0.00–0.07)
Basophils Absolute: 0 10*3/uL (ref 0.0–0.1)
Basophils Relative: 0 %
Eosinophils Absolute: 0.1 10*3/uL (ref 0.0–0.5)
Eosinophils Relative: 3 %
HCT: 31.7 % — ABNORMAL LOW (ref 36.0–46.0)
Hemoglobin: 10.1 g/dL — ABNORMAL LOW (ref 12.0–15.0)
Immature Granulocytes: 0 %
Lymphocytes Relative: 24 %
Lymphs Abs: 0.8 10*3/uL (ref 0.7–4.0)
MCH: 33 pg (ref 26.0–34.0)
MCHC: 31.9 g/dL (ref 30.0–36.0)
MCV: 103.6 fL — ABNORMAL HIGH (ref 80.0–100.0)
Monocytes Absolute: 0.3 10*3/uL (ref 0.1–1.0)
Monocytes Relative: 10 %
Neutro Abs: 2 10*3/uL (ref 1.7–7.7)
Neutrophils Relative %: 63 %
Platelets: 173 10*3/uL (ref 150–400)
RBC: 3.06 MIL/uL — ABNORMAL LOW (ref 3.87–5.11)
RDW: 13.9 % (ref 11.5–15.5)
WBC: 3.3 10*3/uL — ABNORMAL LOW (ref 4.0–10.5)
nRBC: 0 % (ref 0.0–0.2)

## 2020-06-12 LAB — COMPREHENSIVE METABOLIC PANEL
ALT: 10 U/L (ref 0–44)
AST: 10 U/L — ABNORMAL LOW (ref 15–41)
Albumin: 3.4 g/dL — ABNORMAL LOW (ref 3.5–5.0)
Alkaline Phosphatase: 61 U/L (ref 38–126)
Anion gap: 12 (ref 5–15)
BUN: 18 mg/dL (ref 8–23)
CO2: 27 mmol/L (ref 22–32)
Calcium: 9.1 mg/dL (ref 8.9–10.3)
Chloride: 102 mmol/L (ref 98–111)
Creatinine, Ser: 0.92 mg/dL (ref 0.44–1.00)
GFR calc Af Amer: >60 mL/min (ref >60)
GFR calc non Af Amer: 59 mL/min — ABNORMAL LOW (ref >60)
Glucose, Bld: 97 mg/dL (ref 70–99)
Potassium: 3.3 mmol/L — ABNORMAL LOW (ref 3.5–5.1)
Sodium: 141 mmol/L (ref 135–145)
Total Bilirubin: 0.5 mg/dL (ref 0.3–1.2)
Total Protein: 6.6 g/dL (ref 6.5–8.1)

## 2020-06-12 NOTE — Progress Notes (Signed)
[place], [place] [address]   CLINIC:  Medical Oncology/Hematology  PCP:  Vesta Mixer [address] / [place] [address]  [phone]  REASON FOR VISIT:  Follow-up for multiple myeloma  PRIOR THERAPY: None  CURRENT THERAPY: Bortezomib  INTERVAL HISTORY:  Brittany Archer, a 79 y.o. female, returns for routine follow-up for her multiple myeloma. Brittany Archer was last seen on 05/21/2020.  Today she is accompanied by her sister and she reports feeling well and taking the medications. She denies having N/V/D or numbness. Her appetite is good and she has no trouble eating.  REVIEW OF SYSTEMS:  Review of Systems  Constitutional: Negative for appetite change. Fatigue: moderate.  Gastrointestinal: Negative for diarrhea, nausea and vomiting.  Neurological: Negative for numbness.  All other systems reviewed and are negative.   PAST MEDICAL/SURGICAL HISTORY:  Past Medical History:  Diagnosis Date  . Breast cancer ([place])    left breast/ 2008/ surg/ rad tx  . Coronary artery disease   . Diabetes mellitus    Past Surgical History:  Procedure Laterality Date  . ABDOMINAL HYSTERECTOMY    . BREAST SURGERY    . DEBRIDEMENT MANDIBLE N/A 02/22/2020   Procedure: INCISION AND DRAINAGE WITH DEBRIDEMENT MANDIBLE;  Surgeon: Michael Litter, DMD;  Location: [facility];  Service: Oral Surgery;  Laterality: N/A;  . TOOTH EXTRACTION N/A 02/22/2020   Procedure: DENTAL RESTORATION/EXTRACTIONS;  Surgeon: Michael Litter, DMD;  Location: [facility];  Service: Oral Surgery;  Laterality: N/A;  DENTAL KIT REQUESTED    SOCIAL HISTORY:  Social History   Socioeconomic History  . Marital status: Divorced    Spouse name: Not on file  . Number of children: Not on file  . Years of education: Not on file  . Highest education level: Not on file  Occupational History  . Not on file  Tobacco Use  . Smoking status: Never Smoker  . Smokeless tobacco: Never Used   Vaping Use  . Vaping Use: Never used  Substance and Sexual Activity  . Alcohol use: No  . Drug use: No  . Sexual activity: Yes    Birth control/protection: Surgical  Other Topics Concern  . Not on file  Social History Narrative  . Not on file   Social Determinants of Health   Financial Resource Strain:   . Difficulty of Paying Living Expenses:   Food Insecurity:   . Worried About Charity fundraiser in the Last Year:   . Arboriculturist in the Last Year:   Transportation Needs:   . Film/video editor (Medical):   Marland Kitchen Lack of Transportation (Non-Medical):   Physical Activity:   . Days of Exercise per Week:   . Minutes of Exercise per Session:   Stress:   . Feeling of Stress :   Social Connections:   . Frequency of Communication with Friends and Family:   . Frequency of Social Gatherings with Friends and Family:   . Attends Religious Services:   . Active Member of Clubs or Organizations:   . Attends Archivist Meetings:   Marland Kitchen Marital Status:   Intimate Partner Violence:   . Fear of Current or Ex-Partner:   . Emotionally Abused:   Marland Kitchen Physically Abused:   . Sexually Abused:     FAMILY HISTORY:  Family History  Problem Relation Age of Onset  . Obesity Sister     CURRENT MEDICATIONS:  Current Outpatient Medications  Medication Sig Dispense Refill  . acyclovir (ZOVIRAX) 400 MG tablet TAKE 1 TABLET BY MOUTH TWICE DAILY 60 tablet 11  . aspirin 81 MG tablet Take 81 mg by mouth daily.      . bortezomib IV (VELCADE) 3.5 MG injection Inject 3.5 mg into the vein once a week. weekly     . chlorhexidine (PERIDEX) 0.12 % solution Use as directed 15 mLs in the mouth or throat 2 (two) times daily.     Marland Kitchen dexamethasone (DECADRON) 4 MG tablet Take 10 tablets (40 mg) on days 1, 8, and 15 of chemo. Repeat every 21 days. 30 tablet 3  . glipiZIDE (GLUCOTROL) 5 MG tablet Take 5 mg by mouth daily before breakfast.     . lenalidomide (REVLIMID) 20 MG capsule Take 1 capsule by  mouth once daily for 14 days on, and 7 days off of a 21 day cycle. 14 capsule 0  . lisinopril-hydrochlorothiazide (PRINZIDE,ZESTORETIC) 20-25 MG tablet Take 1 tablet by mouth every morning.     . magnesium oxide (MAG-OX) 400 (241.3 Mg) MG tablet Take 1 tablet (400 mg total) by mouth 2 (two) times daily. 60 tablet 0  . metFORMIN (GLUCOPHAGE) 1000 MG tablet Take 1,000 mg by mouth 2 times daily at 12 noon and 4 pm.      . potassium chloride SA (KLOR-CON) 20 MEQ tablet TAKE (2) TABLETS BY MOUTH THREE TIMES DAILY. 168 tablet 11   No current facility-administered medications for this visit.    ALLERGIES:  Allergies  Allergen Reactions  . Motrin [Ibuprofen] Rash    PHYSICAL EXAM:  Performance status (ECOG): 1 - Symptomatic but completely ambulatory  Vitals:   06/12/20 0955  BP: 130/88  Pulse: 62  Resp: 18  Temp: 98.3 F (36.8 C)  SpO2: 97%   Wt Readings from Last 3 Encounters:  06/12/20 160 lb (72.6 kg)  05/28/20 156 lb (70.8 kg)  05/21/20 153 lb 12.8 oz (69.8 kg)   Physical Exam Vitals reviewed.  Constitutional:      Appearance: Normal appearance.  HENT:     Mouth/Throat:     Mouth: Oral lesions (bone exposed on right lower jaw) present.  Cardiovascular:     Rate and Rhythm: Normal rate and regular rhythm.     Pulses: Normal pulses.     Heart sounds: Normal heart sounds.  Pulmonary:     Effort: Pulmonary effort is normal.     Breath sounds: Normal breath sounds.  Abdominal:     Palpations: Abdomen is soft. There is no mass.     Tenderness: There is no abdominal tenderness.  Musculoskeletal:     Right lower leg: Edema (+1) present.     Left lower leg: Edema (+1) present.  Neurological:     General: No focal deficit present.     Mental Status: She is alert and oriented to person, place, and time.  Psychiatric:        Mood and Affect: Mood normal.        Behavior: Behavior normal.     LABORATORY DATA:  I have reviewed the labs as listed.  CBC Latest Ref Rng &  Units 06/12/2020 06/04/2020 05/28/2020  WBC 4.0 - 10.5 K/uL 3.3(L) 7.7 3.7(L)  Hemoglobin 12.0 - 15.0 g/dL 10.1(L) 10.3(L) 11.0(L)  Hematocrit 36 - 46 % 31.7(L) 31.7(L) 34.5(L)  Platelets 150 - 400 K/uL 173 165 205   CMP Latest Ref Rng & Units 06/12/2020 06/04/2020 05/28/2020  Glucose 70 - 99 mg/dL 97 111(H) 90  BUN 8 - 23 mg/dL 18 30(H) 22  Creatinine 0.44 - 1.00 mg/dL 0.92 0.90 0.91  Sodium 135 - 145 mmol/L 141 138 140  Potassium 3.5 - 5.1 mmol/L 3.3(L) 3.4(L) 3.3(L)  Chloride 98 - 111 mmol/L 102 103 101  CO2 22 - 32 mmol/L _0 Calcium 8.9 - 10.3 mg/dL 9.1 9.3 9.9  Total Protein 6.5 - 8.1 g/dL 6.6 7.0 6.9  Total Bilirubin 0.3 - 1.2 mg/dL 0.5 0.3 0.6  Alkaline Phos 38 - 126 U/L 61 61 55  AST 15 - 41 U/L 10(L) 11(L) 12(L)  ALT 0 - 44 U/L _1 Component Value Date/Time   RBC 3.06 (L) 06/12/2020 0920   MCV 103.6 (H) 06/12/2020 0920   MCH 33.0 06/12/2020 0920   MCHC 31.9 06/12/2020 0920   RDW 13.9 06/12/2020 0920   LYMPHSABS 0.8 06/12/2020 0920   MONOABS 0.3 06/12/2020 0920   EOSABS 0.1 06/12/2020 0920   BASOSABS 0.0 06/12/2020 0920    DIAGNOSTIC IMAGING:  I have independently reviewed the scans and discussed with the patient.   ASSESSMENT:  1.  IgA kappa plasma cell myeloma, stage I: -RVD started on 01/09/2018, held since 02/14/2020 due to mandible abscess. -Myeloma labs on 05/14/2020 showed progression with M spike of 0.5 g. -RVD started back on 05/21/2020. -Myeloma labs on 06/04/2020 showed M spike 0.6 g.  Kappa light chains were 39.7 and ratio is 1.26.  2.  Osteomyelitis of the right mandible/dental abscess: -Finished IV ceftriaxone on 04/03/2020.  Finished oral antibiotics. -We will hold Xgeva indefinitely.   PLAN:  1.  IgA kappa plasma cell myeloma, stage I: -I reviewed myeloma labs with the patient.  I have reviewed her CBC which is grossly within normal limits.  LFTs are normal. -She will proceed with Velcade 3 weeks on 1 week off and Revlimid 3 weeks on 1 week  off with dexamethasone. -Plan to repeat myeloma labs in 4 weeks and see her in 5 weeks.  2.  Severe hypokalemia: -Continue potassium 40 mEq 3 times daily.  3.  Osteomyelitis of the right mandible/dental abscess: -She does not have any worsening of swelling.  4.  Hypomagnesemia: -Continue magnesium twice daily at home.  5.  Microcytic anemia: -Combination from myeloma treatments and renal insufficiency. -Retacrit as needed.  Orders placed this encounter:  No orders of the defined types were placed in this encounter.    Derek Jack, MD [place] [phone]   I, Milinda Antis, am acting as a scribe for Dr. Sanda Linger.  I, Derek Jack MD, have reviewed the above documentation for accuracy and completeness, and I agree with the above.

## 2020-06-12 NOTE — Patient Instructions (Signed)
[facility] Discharge Instructions for Patients Receiving Chemotherapy   Beginning January 23rd 2017 lab work for the Cancer Center will be done in the  Main lab at [facility] on 1st floor. If you have a lab appointment with the Cancer Center please come in thru the  Main Entrance and check in at the main information desk   Today you received the following chemotherapy agents Velcade  To help prevent nausea and vomiting after your treatment, we encourage you to take your nausea medication    If you develop nausea and vomiting, or diarrhea that is not controlled by your medication, call the clinic.  The clinic phone number is [phone]. Office hours are Monday-Friday 8:30am-5:00pm.  BELOW ARE SYMPTOMS THAT SHOULD BE REPORTED IMMEDIATELY:  *FEVER GREATER THAN 101.0 F  *CHILLS WITH OR WITHOUT FEVER  NAUSEA AND VOMITING THAT IS NOT CONTROLLED WITH YOUR NAUSEA MEDICATION  *UNUSUAL SHORTNESS OF BREATH  *UNUSUAL BRUISING OR BLEEDING  TENDERNESS IN MOUTH AND THROAT WITH OR WITHOUT PRESENCE OF ULCERS  *URINARY PROBLEMS  *BOWEL PROBLEMS  UNUSUAL RASH Items with * indicate a potential emergency and should be followed up as soon as possible. If you have an emergency after office hours please contact your primary care physician or go to the nearest emergency department.  Please call the clinic during office hours if you have any questions or concerns.   You may also contact the Patient Navigator at [phone] should you have any questions or need assistance in obtaining follow up care.      Resources For Cancer Patients and their Caregivers ? American Cancer Society: Can assist with transportation, wigs, general needs, runs Look Good Feel Better.        1-888-227-6333 ? Cancer Care: Provides financial assistance, online support groups, medication/co-pay assistance.  1-800-813-HOPE (4673) ? [facility] Assists [place] cancer  patients and their families through emotional , educational and financial support.  336-427-4357 ? [place] DSS Where to apply for food stamps, Medicaid and utility assistance. 336-342-1394 ? RCATS: Transportation to medical appointments. 336-347-2287 ? Social Security Administration: May apply for disability if have a Stage IV cancer. 336-342-7796 1-800-772-1213 ? [place] Aging, Disability and Transit Services: Assists with nutrition, care and transit needs. 336-349-2343          

## 2020-06-12 NOTE — Progress Notes (Signed)
Patient has been assessed, vital signs and labs have been reviewed by Dr. Delton Coombes. ANC, Creatinine, LFTs, and Platelets are within treatment parameters per Dr. Delton Coombes. The patient is good to proceed with treatment at this time. Discontinue Xgeva per Dr. Delton Coombes.

## 2020-06-12 NOTE — Patient Instructions (Addendum)
[place] at [facility] Discharge Instructions  You were seen today by Dr. Delton Coombes. He went over your recent results. Continue your weekly injections. Dr. Delton Coombes will see you back in 5 weeks for labs and follow up.   Thank you for choosing [place] at [facility] to provide your oncology and hematology care.  To afford each patient quality time with our provider, please arrive at least 15 minutes before your scheduled appointment time.   If you have a lab appointment with the [facility] please come in thru the Main Entrance and check in at the main information desk  You need to re-schedule your appointment should you arrive 10 or more minutes late.  We strive to give you quality time with our providers, and arriving late affects you and other patients whose appointments are after yours.  Also, if you no show three or more times for appointments you may be dismissed from the clinic at the providers discretion.     Again, thank you for choosing [facility].  Our hope is that these requests will decrease the amount of time that you wait before being seen by our physicians.       _____________________________________________________________  Should you have questions after your visit to [facility], please contact our office at [phone] between the hours of 8:00 a.m. and 4:30 p.m.  Voicemails left after 4:00 p.m. will not be returned until the following business day.  For prescription refill requests, have your pharmacy contact our office and allow 72 hours.    Cancer Center Support Programs:   > Cancer Support Group  2nd Tuesday of the month 1pm-2pm, Journey Room

## 2020-06-12 NOTE — Progress Notes (Signed)
Per Dr. Delton Coombes, this is the patients off week for her Velcade. MD states she will remain D1,8,15 q28days. She will return next week for D1. Discharged in satisfactory condition with follow up instructions.

## 2020-06-19 ENCOUNTER — Other Ambulatory Visit: Payer: Self-pay

## 2020-06-19 ENCOUNTER — Encounter (HOSPITAL_COMMUNITY): Payer: Self-pay

## 2020-06-19 ENCOUNTER — Inpatient Hospital Stay (HOSPITAL_COMMUNITY): Payer: Medicare Other

## 2020-06-19 VITALS — BP 169/71 | HR 57 | Temp 97.2°F | Resp 18 | Wt 163.0 lb

## 2020-06-19 DIAGNOSIS — C9000 Multiple myeloma not having achieved remission: Secondary | ICD-10-CM

## 2020-06-19 DIAGNOSIS — Z5112 Encounter for antineoplastic immunotherapy: Secondary | ICD-10-CM | POA: Diagnosis not present

## 2020-06-19 LAB — COMPREHENSIVE METABOLIC PANEL
ALT: 10 U/L (ref 0–44)
AST: 9 U/L — ABNORMAL LOW (ref 15–41)
Albumin: 3.4 g/dL — ABNORMAL LOW (ref 3.5–5.0)
Alkaline Phosphatase: 51 U/L (ref 38–126)
Anion gap: 10 (ref 5–15)
BUN: 16 mg/dL (ref 8–23)
CO2: 30 mmol/L (ref 22–32)
Calcium: 9.9 mg/dL (ref 8.9–10.3)
Chloride: 101 mmol/L (ref 98–111)
Creatinine, Ser: 0.93 mg/dL (ref 0.44–1.00)
GFR calc Af Amer: >60 mL/min (ref >60)
GFR calc non Af Amer: 58 mL/min — ABNORMAL LOW (ref >60)
Glucose, Bld: 94 mg/dL (ref 70–99)
Potassium: 3.6 mmol/L (ref 3.5–5.1)
Sodium: 141 mmol/L (ref 135–145)
Total Bilirubin: 0.5 mg/dL (ref 0.3–1.2)
Total Protein: 6.7 g/dL (ref 6.5–8.1)

## 2020-06-19 LAB — CBC WITH DIFFERENTIAL/PLATELET
Abs Immature Granulocytes: 0.01 10*3/uL (ref 0.00–0.07)
Basophils Absolute: 0 10*3/uL (ref 0.0–0.1)
Basophils Relative: 1 %
Eosinophils Absolute: 0.1 10*3/uL (ref 0.0–0.5)
Eosinophils Relative: 3 %
HCT: 31.3 % — ABNORMAL LOW (ref 36.0–46.0)
Hemoglobin: 10 g/dL — ABNORMAL LOW (ref 12.0–15.0)
Immature Granulocytes: 0 %
Lymphocytes Relative: 25 %
Lymphs Abs: 1 10*3/uL (ref 0.7–4.0)
MCH: 32.7 pg (ref 26.0–34.0)
MCHC: 31.9 g/dL (ref 30.0–36.0)
MCV: 102.3 fL — ABNORMAL HIGH (ref 80.0–100.0)
Monocytes Absolute: 0.4 10*3/uL (ref 0.1–1.0)
Monocytes Relative: 11 %
Neutro Abs: 2.5 10*3/uL (ref 1.7–7.7)
Neutrophils Relative %: 60 %
Platelets: 221 10*3/uL (ref 150–400)
RBC: 3.06 MIL/uL — ABNORMAL LOW (ref 3.87–5.11)
RDW: 13.8 % (ref 11.5–15.5)
WBC: 4.1 10*3/uL (ref 4.0–10.5)
nRBC: 0 % (ref 0.0–0.2)

## 2020-06-19 LAB — MAGNESIUM: Magnesium: 1.4 mg/dL — ABNORMAL LOW (ref 1.7–2.4)

## 2020-06-19 MED ORDER — DEXAMETHASONE 4 MG PO TABS
40.0000 mg | ORAL_TABLET | Freq: Once | ORAL | Status: AC
  Administered 2020-06-19: 40 mg via ORAL
  Filled 2020-06-19: qty 10

## 2020-06-19 MED ORDER — SODIUM CHLORIDE 0.9 % IV SOLN: INTRAVENOUS | Status: DC

## 2020-06-19 MED ORDER — MAGNESIUM SULFATE 2 GM/50ML IV SOLN
2.0000 g | Freq: Once | INTRAVENOUS | Status: AC
  Administered 2020-06-19: 2 g via INTRAVENOUS
  Filled 2020-06-19: qty 50

## 2020-06-19 MED ORDER — BORTEZOMIB CHEMO SQ INJECTION 3.5 MG (2.5MG/ML)
1.3000 mg/m2 | Freq: Once | INTRAMUSCULAR | Status: AC
  Administered 2020-06-19: 2.25 mg via SUBCUTANEOUS
  Filled 2020-06-19: qty 0.9

## 2020-06-19 MED ORDER — PROCHLORPERAZINE MALEATE 10 MG PO TABS
10.0000 mg | ORAL_TABLET | Freq: Once | ORAL | Status: AC
  Administered 2020-06-19: 10 mg via ORAL
  Filled 2020-06-19: qty 1

## 2020-06-19 NOTE — Patient Instructions (Signed)
[facility] Discharge Instructions for Patients Receiving Chemotherapy  Today you received the following chemotherapy agents   To help prevent nausea and vomiting after your treatment, we encourage you to take your nausea medication   If you develop nausea and vomiting that is not controlled by your nausea medication, call the clinic.   BELOW ARE SYMPTOMS THAT SHOULD BE REPORTED IMMEDIATELY:  *FEVER GREATER THAN 100.5 F  *CHILLS WITH OR WITHOUT FEVER  NAUSEA AND VOMITING THAT IS NOT CONTROLLED WITH YOUR NAUSEA MEDICATION  *UNUSUAL SHORTNESS OF BREATH  *UNUSUAL BRUISING OR BLEEDING  TENDERNESS IN MOUTH AND THROAT WITH OR WITHOUT PRESENCE OF ULCERS  *URINARY PROBLEMS  *BOWEL PROBLEMS  UNUSUAL RASH Items with * indicate a potential emergency and should be followed up as soon as possible.  Feel free to call the clinic should you have any questions or concerns. The clinic phone number is [phone].  Please show the CHEMO ALERT CARD at check-in to the Emergency Department and triage nurse.   

## 2020-06-19 NOTE — Progress Notes (Signed)
Labs reviewed today with Francene Finders NP, ok to treat today and will give additional magnesium per orders.   Velcade injection and magnesium given per orders today.  Patient tolerated it well without problems. Vitals stable and discharged home from clinic ambulatory. Follow up as scheduled.

## 2020-06-26 ENCOUNTER — Inpatient Hospital Stay (HOSPITAL_COMMUNITY): Payer: Medicare Other

## 2020-06-26 ENCOUNTER — Encounter (HOSPITAL_COMMUNITY): Payer: Self-pay

## 2020-06-26 ENCOUNTER — Other Ambulatory Visit: Payer: Self-pay

## 2020-06-26 VITALS — BP 150/59 | HR 54 | Temp 97.5°F | Resp 18 | Wt 162.4 lb

## 2020-06-26 DIAGNOSIS — C9000 Multiple myeloma not having achieved remission: Secondary | ICD-10-CM

## 2020-06-26 DIAGNOSIS — Z5112 Encounter for antineoplastic immunotherapy: Secondary | ICD-10-CM | POA: Diagnosis not present

## 2020-06-26 DIAGNOSIS — D649 Anemia, unspecified: Secondary | ICD-10-CM

## 2020-06-26 DIAGNOSIS — E876 Hypokalemia: Secondary | ICD-10-CM

## 2020-06-26 LAB — COMPREHENSIVE METABOLIC PANEL
ALT: 9 U/L (ref 0–44)
AST: 12 U/L — ABNORMAL LOW (ref 15–41)
Albumin: 3.5 g/dL (ref 3.5–5.0)
Alkaline Phosphatase: 62 U/L (ref 38–126)
Anion gap: 8 (ref 5–15)
BUN: 18 mg/dL (ref 8–23)
CO2: 25 mmol/L (ref 22–32)
Calcium: 9 mg/dL (ref 8.9–10.3)
Chloride: 106 mmol/L (ref 98–111)
Creatinine, Ser: 1.07 mg/dL — ABNORMAL HIGH (ref 0.44–1.00)
GFR calc Af Amer: 57 mL/min — ABNORMAL LOW (ref >60)
GFR calc non Af Amer: 49 mL/min — ABNORMAL LOW (ref >60)
Glucose, Bld: 149 mg/dL — ABNORMAL HIGH (ref 70–99)
Potassium: 3.2 mmol/L — ABNORMAL LOW (ref 3.5–5.1)
Sodium: 139 mmol/L (ref 135–145)
Total Bilirubin: 0.5 mg/dL (ref 0.3–1.2)
Total Protein: 6.5 g/dL (ref 6.5–8.1)

## 2020-06-26 LAB — CBC WITH DIFFERENTIAL/PLATELET
Abs Immature Granulocytes: 0.01 10*3/uL (ref 0.00–0.07)
Basophils Absolute: 0 10*3/uL (ref 0.0–0.1)
Basophils Relative: 1 %
Eosinophils Absolute: 0.1 10*3/uL (ref 0.0–0.5)
Eosinophils Relative: 3 %
HCT: 31.8 % — ABNORMAL LOW (ref 36.0–46.0)
Hemoglobin: 10 g/dL — ABNORMAL LOW (ref 12.0–15.0)
Immature Granulocytes: 0 %
Lymphocytes Relative: 37 %
Lymphs Abs: 1 10*3/uL (ref 0.7–4.0)
MCH: 32.8 pg (ref 26.0–34.0)
MCHC: 31.4 g/dL (ref 30.0–36.0)
MCV: 104.3 fL — ABNORMAL HIGH (ref 80.0–100.0)
Monocytes Absolute: 0.3 10*3/uL (ref 0.1–1.0)
Monocytes Relative: 12 %
Neutro Abs: 1.2 10*3/uL — ABNORMAL LOW (ref 1.7–7.7)
Neutrophils Relative %: 47 %
Platelets: 166 10*3/uL (ref 150–400)
RBC: 3.05 MIL/uL — ABNORMAL LOW (ref 3.87–5.11)
RDW: 14 % (ref 11.5–15.5)
WBC: 2.6 10*3/uL — ABNORMAL LOW (ref 4.0–10.5)
nRBC: 0 % (ref 0.0–0.2)

## 2020-06-26 LAB — MAGNESIUM: Magnesium: 1.5 mg/dL — ABNORMAL LOW (ref 1.7–2.4)

## 2020-06-26 MED ORDER — SODIUM CHLORIDE 0.9 % IV SOLN
Freq: Once | INTRAVENOUS | Status: AC
  Filled 2020-06-26: qty 10

## 2020-06-26 MED ORDER — PROCHLORPERAZINE MALEATE 10 MG PO TABS
10.0000 mg | ORAL_TABLET | Freq: Once | ORAL | Status: AC
  Administered 2020-06-26: 10 mg via ORAL
  Filled 2020-06-26: qty 1

## 2020-06-26 MED ORDER — BORTEZOMIB CHEMO SQ INJECTION 3.5 MG (2.5MG/ML)
1.3000 mg/m2 | Freq: Once | INTRAMUSCULAR | Status: AC
  Administered 2020-06-26: 2.25 mg via SUBCUTANEOUS
  Filled 2020-06-26: qty 0.9

## 2020-06-26 MED ORDER — DEXAMETHASONE 4 MG PO TABS
40.0000 mg | ORAL_TABLET | Freq: Once | ORAL | Status: AC
  Administered 2020-06-26: 40 mg via ORAL
  Filled 2020-06-26: qty 10

## 2020-06-26 NOTE — Progress Notes (Signed)
47 Labs reviewed with RLockamy NP and pt approved for Velcade injection today as well as 1 liter of hydration with magnesium and potassium over 2 hours today per NP.                 Brittany Archer tolerated Velcade injection and IV hydration well without complaints or incident. Peripheral IV site checked with positive blood return noted prior to and after infusion. VSS upon discharge. Pt discharged self ambulatory in satisfactory condition accompanied by family member

## 2020-06-26 NOTE — Patient Instructions (Signed)
[facility] Discharge Instructions for Patients Receiving Chemotherapy   Beginning January 23rd 2017 lab work for the [facility] will be done in the  Main lab at [facility] on 1st floor. If you have a lab appointment with the [facility] please come in thru the  Main Entrance and check in at the main information desk   Today you received the following chemotherapy agents Velcade injection as well as IV hydration with magnesium and potassium. Follow-up as scheduled  To help prevent nausea and vomiting after your treatment, we encourage you to take your nausea medication   If you develop nausea and vomiting, or diarrhea that is not controlled by your medication, call the clinic.  The clinic phone number is [phone]. Office hours are Monday-Friday 8:30am-5:00pm.  BELOW ARE SYMPTOMS THAT SHOULD BE REPORTED IMMEDIATELY:  *FEVER GREATER THAN 101.0 F  *CHILLS WITH OR WITHOUT FEVER  NAUSEA AND VOMITING THAT IS NOT CONTROLLED WITH YOUR NAUSEA MEDICATION  *UNUSUAL SHORTNESS OF BREATH  *UNUSUAL BRUISING OR BLEEDING  TENDERNESS IN MOUTH AND THROAT WITH OR WITHOUT PRESENCE OF ULCERS  *URINARY PROBLEMS  *BOWEL PROBLEMS  UNUSUAL RASH Items with * indicate a potential emergency and should be followed up as soon as possible. If you have an emergency after office hours please contact your primary care physician or go to the nearest emergency department.  Please call the clinic during office hours if you have any questions or concerns.   You may also contact the Patient Navigator at [phone] should you have any questions or need assistance in obtaining follow up care.      Resources For Cancer Patients and their Caregivers ? American Cancer Society: Can assist with transportation, wigs, general needs, runs Look Good Feel Better.        905-270-0765 ? Cancer Care: Provides financial assistance, online support groups, medication/co-pay assistance.   1-800-813-HOPE 251-248-6281) ? [facility] Assists [place] cancer patients and their families through emotional , educational and financial support.  701-596-2638 ? [place] DSS Where to apply for food stamps, Medicaid and utility assistance. 5042988391 ? RCATS: Transportation to medical appointments. 785-028-7528 ? Social Security Administration: May apply for disability if have a Stage IV cancer. 717-485-0919 234-178-0441 ? [employer], Disability and Transit Services: Assists with nutrition, care and transit needs. 5703944245

## 2020-07-03 ENCOUNTER — Inpatient Hospital Stay (HOSPITAL_COMMUNITY): Payer: Medicare Other | Attending: Hematology

## 2020-07-03 ENCOUNTER — Inpatient Hospital Stay (HOSPITAL_COMMUNITY): Payer: Medicare Other

## 2020-07-03 ENCOUNTER — Other Ambulatory Visit: Payer: Self-pay

## 2020-07-03 ENCOUNTER — Encounter (HOSPITAL_COMMUNITY): Payer: Self-pay

## 2020-07-03 VITALS — BP 130/54 | HR 65 | Temp 97.1°F | Resp 18

## 2020-07-03 DIAGNOSIS — C9000 Multiple myeloma not having achieved remission: Secondary | ICD-10-CM | POA: Insufficient documentation

## 2020-07-03 DIAGNOSIS — Z5112 Encounter for antineoplastic immunotherapy: Secondary | ICD-10-CM | POA: Diagnosis not present

## 2020-07-03 DIAGNOSIS — E538 Deficiency of other specified B group vitamins: Secondary | ICD-10-CM | POA: Diagnosis not present

## 2020-07-03 DIAGNOSIS — D649 Anemia, unspecified: Secondary | ICD-10-CM

## 2020-07-03 LAB — CBC WITH DIFFERENTIAL/PLATELET
Abs Immature Granulocytes: 0.03 10*3/uL (ref 0.00–0.07)
Basophils Absolute: 0 10*3/uL (ref 0.0–0.1)
Basophils Relative: 1 %
Eosinophils Absolute: 0.1 10*3/uL (ref 0.0–0.5)
Eosinophils Relative: 1 %
HCT: 31.2 % — ABNORMAL LOW (ref 36.0–46.0)
Hemoglobin: 10.3 g/dL — ABNORMAL LOW (ref 12.0–15.0)
Immature Granulocytes: 1 %
Lymphocytes Relative: 24 %
Lymphs Abs: 1.2 10*3/uL (ref 0.7–4.0)
MCH: 33.3 pg (ref 26.0–34.0)
MCHC: 33 g/dL (ref 30.0–36.0)
MCV: 101 fL — ABNORMAL HIGH (ref 80.0–100.0)
Monocytes Absolute: 0.5 10*3/uL (ref 0.1–1.0)
Monocytes Relative: 11 %
Neutro Abs: 3.1 10*3/uL (ref 1.7–7.7)
Neutrophils Relative %: 62 %
Platelets: 146 10*3/uL — ABNORMAL LOW (ref 150–400)
RBC: 3.09 MIL/uL — ABNORMAL LOW (ref 3.87–5.11)
RDW: 14.4 % (ref 11.5–15.5)
WBC: 4.9 10*3/uL (ref 4.0–10.5)
nRBC: 0 % (ref 0.0–0.2)

## 2020-07-03 LAB — COMPREHENSIVE METABOLIC PANEL
ALT: 8 U/L (ref 0–44)
AST: 10 U/L — ABNORMAL LOW (ref 15–41)
Albumin: 3.6 g/dL (ref 3.5–5.0)
Alkaline Phosphatase: 59 U/L (ref 38–126)
Anion gap: 6 (ref 5–15)
BUN: 24 mg/dL — ABNORMAL HIGH (ref 8–23)
CO2: 30 mmol/L (ref 22–32)
Calcium: 9.3 mg/dL (ref 8.9–10.3)
Chloride: 105 mmol/L (ref 98–111)
Creatinine, Ser: 1.01 mg/dL — ABNORMAL HIGH (ref 0.44–1.00)
GFR calc Af Amer: >60 mL/min (ref >60)
GFR calc non Af Amer: 53 mL/min — ABNORMAL LOW (ref >60)
Glucose, Bld: 75 mg/dL (ref 70–99)
Potassium: 3.3 mmol/L — ABNORMAL LOW (ref 3.5–5.1)
Sodium: 141 mmol/L (ref 135–145)
Total Bilirubin: 0.6 mg/dL (ref 0.3–1.2)
Total Protein: 6.4 g/dL — ABNORMAL LOW (ref 6.5–8.1)

## 2020-07-03 LAB — MAGNESIUM: Magnesium: 1.4 mg/dL — ABNORMAL LOW (ref 1.7–2.4)

## 2020-07-03 MED ORDER — BORTEZOMIB CHEMO SQ INJECTION 3.5 MG (2.5MG/ML)
1.3000 mg/m2 | Freq: Once | INTRAMUSCULAR | Status: AC
  Administered 2020-07-03: 2.25 mg via SUBCUTANEOUS
  Filled 2020-07-03: qty 0.9

## 2020-07-03 MED ORDER — MAGNESIUM SULFATE 2 GM/50ML IV SOLN
INTRAVENOUS | Status: AC
  Filled 2020-07-03: qty 50

## 2020-07-03 MED ORDER — CYANOCOBALAMIN 1000 MCG/ML IJ SOLN
1000.0000 ug | Freq: Once | INTRAMUSCULAR | Status: AC
  Administered 2020-07-03: 1000 ug via INTRAMUSCULAR
  Filled 2020-07-03: qty 1

## 2020-07-03 MED ORDER — MAGNESIUM SULFATE 2 GM/50ML IV SOLN
2.0000 g | Freq: Once | INTRAVENOUS | Status: AC
  Administered 2020-07-03: 2 g via INTRAVENOUS

## 2020-07-03 MED ORDER — DEXAMETHASONE 4 MG PO TABS
40.0000 mg | ORAL_TABLET | Freq: Once | ORAL | Status: AC
  Administered 2020-07-03: 40 mg via ORAL
  Filled 2020-07-03: qty 10

## 2020-07-03 MED ORDER — SODIUM CHLORIDE 0.9 % IV SOLN: INTRAVENOUS | Status: DC

## 2020-07-03 MED ORDER — PROCHLORPERAZINE MALEATE 10 MG PO TABS
10.0000 mg | ORAL_TABLET | Freq: Once | ORAL | Status: AC
  Administered 2020-07-03: 10 mg via ORAL
  Filled 2020-07-03: qty 1

## 2020-07-03 NOTE — Progress Notes (Signed)
Labs and vital signs reviewed with Dr. Delton Coombes.  Okay to proceed with Velcade injection today.  Order rec'd to give Mg++ 2 g IV. Instructed pt per MD to increase Mag Ox to three times a day instead of just twice a day.  Pt and family member verbalize understanding.   Patient taking Revlimid as prescribed. Has not missed any doses & reports no side effects.  Tolerated magnesium infusion w/o adverse reaction.  Velcade administered per orders.  A&Ox4, in no distress.  Discharged ambulatory in c/o family member.

## 2020-07-10 ENCOUNTER — Inpatient Hospital Stay (HOSPITAL_COMMUNITY): Payer: Medicare Other

## 2020-07-10 ENCOUNTER — Ambulatory Visit (HOSPITAL_COMMUNITY): Payer: Medicare Other

## 2020-07-10 ENCOUNTER — Other Ambulatory Visit: Payer: Self-pay

## 2020-07-10 DIAGNOSIS — C9000 Multiple myeloma not having achieved remission: Secondary | ICD-10-CM

## 2020-07-10 DIAGNOSIS — Z5112 Encounter for antineoplastic immunotherapy: Secondary | ICD-10-CM | POA: Diagnosis not present

## 2020-07-10 LAB — COMPREHENSIVE METABOLIC PANEL
ALT: 10 U/L (ref 0–44)
AST: 13 U/L — ABNORMAL LOW (ref 15–41)
Albumin: 3.6 g/dL (ref 3.5–5.0)
Alkaline Phosphatase: 63 U/L (ref 38–126)
Anion gap: 10 (ref 5–15)
BUN: 15 mg/dL (ref 8–23)
CO2: 28 mmol/L (ref 22–32)
Calcium: 9.5 mg/dL (ref 8.9–10.3)
Chloride: 104 mmol/L (ref 98–111)
Creatinine, Ser: 0.98 mg/dL (ref 0.44–1.00)
GFR calc Af Amer: >60 mL/min (ref >60)
GFR calc non Af Amer: 54 mL/min — ABNORMAL LOW (ref >60)
Glucose, Bld: 87 mg/dL (ref 70–99)
Potassium: 3 mmol/L — ABNORMAL LOW (ref 3.5–5.1)
Sodium: 142 mmol/L (ref 135–145)
Total Bilirubin: 0.8 mg/dL (ref 0.3–1.2)
Total Protein: 6.3 g/dL — ABNORMAL LOW (ref 6.5–8.1)

## 2020-07-10 LAB — CBC WITH DIFFERENTIAL/PLATELET
Abs Immature Granulocytes: 0.02 10*3/uL (ref 0.00–0.07)
Basophils Absolute: 0 10*3/uL (ref 0.0–0.1)
Basophils Relative: 1 %
Eosinophils Absolute: 0.2 10*3/uL (ref 0.0–0.5)
Eosinophils Relative: 5 %
HCT: 30.7 % — ABNORMAL LOW (ref 36.0–46.0)
Hemoglobin: 10 g/dL — ABNORMAL LOW (ref 12.0–15.0)
Immature Granulocytes: 1 %
Lymphocytes Relative: 19 %
Lymphs Abs: 0.8 10*3/uL (ref 0.7–4.0)
MCH: 32.9 pg (ref 26.0–34.0)
MCHC: 32.6 g/dL (ref 30.0–36.0)
MCV: 101 fL — ABNORMAL HIGH (ref 80.0–100.0)
Monocytes Absolute: 0.5 10*3/uL (ref 0.1–1.0)
Monocytes Relative: 12 %
Neutro Abs: 2.7 10*3/uL (ref 1.7–7.7)
Neutrophils Relative %: 62 %
Platelets: 136 10*3/uL — ABNORMAL LOW (ref 150–400)
RBC: 3.04 MIL/uL — ABNORMAL LOW (ref 3.87–5.11)
RDW: 14.3 % (ref 11.5–15.5)
WBC: 4.3 10*3/uL (ref 4.0–10.5)
nRBC: 0 % (ref 0.0–0.2)

## 2020-07-10 LAB — LACTATE DEHYDROGENASE: LDH: 118 U/L (ref 98–192)

## 2020-07-11 LAB — KAPPA/LAMBDA LIGHT CHAINS
Kappa free light chain: 28.4 mg/L — ABNORMAL HIGH (ref 3.3–19.4)
Kappa, lambda light chain ratio: 1.74 — ABNORMAL HIGH (ref 0.26–1.65)
Lambda free light chains: 16.3 mg/L (ref 5.7–26.3)

## 2020-07-12 LAB — PROTEIN ELECTROPHORESIS, SERUM
A/G Ratio: 1.4 (ref 0.7–1.7)
Albumin ELP: 3.5 g/dL (ref 2.9–4.4)
Alpha-1-Globulin: 0.1 g/dL (ref 0.0–0.4)
Alpha-2-Globulin: 0.7 g/dL (ref 0.4–1.0)
Beta Globulin: 1 g/dL (ref 0.7–1.3)
Gamma Globulin: 0.7 g/dL (ref 0.4–1.8)
Globulin, Total: 2.5 g/dL (ref 2.2–3.9)
M-Spike, %: 0.3 g/dL — ABNORMAL HIGH
Total Protein ELP: 6 g/dL (ref 6.0–8.5)

## 2020-07-17 ENCOUNTER — Other Ambulatory Visit: Payer: Self-pay

## 2020-07-17 ENCOUNTER — Inpatient Hospital Stay (HOSPITAL_BASED_OUTPATIENT_CLINIC_OR_DEPARTMENT_OTHER): Payer: Medicare Other | Admitting: Hematology

## 2020-07-17 ENCOUNTER — Inpatient Hospital Stay (HOSPITAL_COMMUNITY): Payer: Medicare Other

## 2020-07-17 VITALS — BP 144/55 | HR 64 | Temp 96.9°F | Resp 18 | Wt 172.4 lb

## 2020-07-17 DIAGNOSIS — C9000 Multiple myeloma not having achieved remission: Secondary | ICD-10-CM

## 2020-07-17 DIAGNOSIS — D649 Anemia, unspecified: Secondary | ICD-10-CM

## 2020-07-17 DIAGNOSIS — Z5112 Encounter for antineoplastic immunotherapy: Secondary | ICD-10-CM | POA: Diagnosis not present

## 2020-07-17 DIAGNOSIS — E538 Deficiency of other specified B group vitamins: Secondary | ICD-10-CM

## 2020-07-17 LAB — CBC WITH DIFFERENTIAL/PLATELET
Abs Immature Granulocytes: 0.01 10*3/uL (ref 0.00–0.07)
Basophils Absolute: 0 10*3/uL (ref 0.0–0.1)
Basophils Relative: 1 %
Eosinophils Absolute: 0.2 10*3/uL (ref 0.0–0.5)
Eosinophils Relative: 4 %
HCT: 26.9 % — ABNORMAL LOW (ref 36.0–46.0)
Hemoglobin: 8.7 g/dL — ABNORMAL LOW (ref 12.0–15.0)
Immature Granulocytes: 0 %
Lymphocytes Relative: 24 %
Lymphs Abs: 0.9 10*3/uL (ref 0.7–4.0)
MCH: 32.8 pg (ref 26.0–34.0)
MCHC: 32.3 g/dL (ref 30.0–36.0)
MCV: 101.5 fL — ABNORMAL HIGH (ref 80.0–100.0)
Monocytes Absolute: 0.4 10*3/uL (ref 0.1–1.0)
Monocytes Relative: 9 %
Neutro Abs: 2.3 10*3/uL (ref 1.7–7.7)
Neutrophils Relative %: 62 %
Platelets: 188 10*3/uL (ref 150–400)
RBC: 2.65 MIL/uL — ABNORMAL LOW (ref 3.87–5.11)
RDW: 14.4 % (ref 11.5–15.5)
WBC: 3.7 10*3/uL — ABNORMAL LOW (ref 4.0–10.5)
nRBC: 0 % (ref 0.0–0.2)

## 2020-07-17 LAB — COMPREHENSIVE METABOLIC PANEL
ALT: 10 U/L (ref 0–44)
AST: 8 U/L — ABNORMAL LOW (ref 15–41)
Albumin: 3.3 g/dL — ABNORMAL LOW (ref 3.5–5.0)
Alkaline Phosphatase: 56 U/L (ref 38–126)
Anion gap: 9 (ref 5–15)
BUN: 22 mg/dL (ref 8–23)
CO2: 24 mmol/L (ref 22–32)
Calcium: 8.4 mg/dL — ABNORMAL LOW (ref 8.9–10.3)
Chloride: 109 mmol/L (ref 98–111)
Creatinine, Ser: 0.96 mg/dL (ref 0.44–1.00)
GFR calc Af Amer: >60 mL/min (ref >60)
GFR calc non Af Amer: 56 mL/min — ABNORMAL LOW (ref >60)
Glucose, Bld: 127 mg/dL — ABNORMAL HIGH (ref 70–99)
Potassium: 3.3 mmol/L — ABNORMAL LOW (ref 3.5–5.1)
Sodium: 142 mmol/L (ref 135–145)
Total Bilirubin: 0.4 mg/dL (ref 0.3–1.2)
Total Protein: 6.5 g/dL (ref 6.5–8.1)

## 2020-07-17 MED ORDER — DEXAMETHASONE 4 MG PO TABS
40.0000 mg | ORAL_TABLET | Freq: Once | ORAL | Status: AC
  Administered 2020-07-17: 40 mg via ORAL
  Filled 2020-07-17: qty 10

## 2020-07-17 MED ORDER — EPOETIN ALFA-EPBX 10000 UNIT/ML IJ SOLN
20000.0000 [IU] | Freq: Once | INTRAMUSCULAR | Status: AC
  Administered 2020-07-17: 20000 [IU] via SUBCUTANEOUS
  Filled 2020-07-17: qty 2

## 2020-07-17 MED ORDER — BORTEZOMIB CHEMO SQ INJECTION 3.5 MG (2.5MG/ML)
1.3000 mg/m2 | Freq: Once | INTRAMUSCULAR | Status: AC
  Administered 2020-07-17: 2.25 mg via SUBCUTANEOUS
  Filled 2020-07-17: qty 0.9

## 2020-07-17 MED ORDER — PROCHLORPERAZINE MALEATE 10 MG PO TABS
10.0000 mg | ORAL_TABLET | Freq: Once | ORAL | Status: AC
  Administered 2020-07-17: 10 mg via ORAL
  Filled 2020-07-17: qty 1

## 2020-07-17 NOTE — Patient Instructions (Signed)
[place] at [facility] Discharge Instructions  You were seen today by Dr. Delton Coombes. He went over your recent results. You received your injection today; continue your weekly injections. Continue taking your medications as prescribed. Dr. Delton Coombes will see you back in 5 weeks for labs and follow up.   Thank you for choosing [place] at [facility] to provide your oncology and hematology care.  To afford each patient quality time with our provider, please arrive at least 15 minutes before your scheduled appointment time.   If you have a lab appointment with the [facility] please come in thru the Main Entrance and check in at the main information desk  You need to re-schedule your appointment should you arrive 10 or more minutes late.  We strive to give you quality time with our providers, and arriving late affects you and other patients whose appointments are after yours.  Also, if you no show three or more times for appointments you may be dismissed from the clinic at the providers discretion.     Again, thank you for choosing [facility].  Our hope is that these requests will decrease the amount of time that you wait before being seen by our physicians.       _____________________________________________________________  Should you have questions after your visit to [facility], please contact our office at [phone] between the hours of 8:00 a.m. and 4:30 p.m.  Voicemails left after 4:00 p.m. will not be returned until the following business day.  For prescription refill requests, have your pharmacy contact our office and allow 72 hours.    Cancer Center Support Programs:   > Cancer Support Group  2nd Tuesday of the month 1pm-2pm, Journey Room

## 2020-07-17 NOTE — Progress Notes (Signed)
Patient tolerated Velcade injection and Retacrit injection with no complaints voiced.  Lab work reviewed.  See MAR for details.  Injection site clean and dry with no bruising or swelling noted.  Band aid applied.  VSS.  Patient left in satisfactory condition with no s/s of distress noted.

## 2020-07-17 NOTE — Progress Notes (Signed)
Brittany Archer, Brittany Archer [address]   CLINIC:  Medical Oncology/Hematology  PCP:  Brittany Archer [address] / [place] [address]  [phone]  REASON FOR VISIT:  Follow-up for multiple myeloma  PRIOR THERAPY: None  CURRENT THERAPY: Bortezomib  INTERVAL HISTORY:  Brittany Archer, a 79 y.o. female, returns for routine follow-up for her multiple myeloma. Brittany Archer was last seen on 06/12/2020.  Today she is accompanied by her sister. Her sister contradicts her reports that she is taking her medications daily, though she takes the Revlimid consistently. She has to break down the potassium tablets in order to ingest them. She denies N/V/D, or numbness/tingling.   REVIEW OF SYSTEMS:  Review of Systems  Constitutional: Positive for fatigue (moderate). Negative for appetite change.  Cardiovascular: Positive for leg swelling.  Gastrointestinal: Negative for diarrhea, nausea and vomiting.  Neurological: Negative for numbness.  All other systems reviewed and are negative.   PAST MEDICAL/SURGICAL HISTORY:  Past Medical History:  Diagnosis Date  . Breast cancer ([place])    left breast/ 2008/ surg/ rad tx  . Coronary artery disease   . Diabetes mellitus    Past Surgical History:  Procedure Laterality Date  . ABDOMINAL HYSTERECTOMY    . BREAST SURGERY    . DEBRIDEMENT MANDIBLE N/A 02/22/2020   Procedure: INCISION AND DRAINAGE WITH DEBRIDEMENT MANDIBLE;  Surgeon: Michael Litter, DMD;  Location: [facility] ORS;  Service: Oral Surgery;  Laterality: N/A;  . TOOTH EXTRACTION N/A 02/22/2020   Procedure: DENTAL RESTORATION/EXTRACTIONS;  Surgeon: Michael Litter, DMD;  Location: [facility] ORS;  Service: Oral Surgery;  Laterality: N/A;  DENTAL KIT REQUESTED    SOCIAL HISTORY:  Social History   Socioeconomic History  . Marital status: Divorced    Spouse name: Not on file  . Number of children: Not on file  . Years of education: Not on file  . Highest  education level: Not on file  Occupational History  . Not on file  Tobacco Use  . Smoking status: Never Smoker  . Smokeless tobacco: Never Used  Vaping Use  . Vaping Use: Never used  Substance and Sexual Activity  . Alcohol use: No  . Drug use: No  . Sexual activity: Yes    Birth control/protection: Surgical  Other Topics Concern  . Not on file  Social History Narrative  . Not on file   Social Determinants of Health   Financial Resource Strain:   . Difficulty of Paying Living Expenses:   Food Insecurity:   . Worried About Charity fundraiser in the Last Year:   . Arboriculturist in the Last Year:   Transportation Needs:   . Film/video editor (Medical):   Marland Kitchen Lack of Transportation (Non-Medical):   Physical Activity:   . Days of Exercise per Week:   . Minutes of Exercise per Session:   Stress:   . Feeling of Stress :   Social Connections:   . Frequency of Communication with Friends and Family:   . Frequency of Social Gatherings with Friends and Family:   . Attends Religious Services:   . Active Member of Clubs or Organizations:   . Attends Archivist Meetings:   Marland Kitchen Marital Status:   Intimate Partner Violence:   . Fear of Current or Ex-Partner:   . Emotionally Abused:   Marland Kitchen Physically Abused:   . Sexually Abused:     FAMILY HISTORY:  Family History  Problem Relation Age of Onset  . Obesity Sister     CURRENT MEDICATIONS:  Current Outpatient Medications  Medication Sig Dispense Refill  . acyclovir (ZOVIRAX) 400 MG tablet TAKE 1 TABLET BY MOUTH TWICE DAILY 60 tablet 11  . aspirin 81 MG tablet Take 81 mg by mouth daily.      . bortezomib IV (VELCADE) 3.5 MG injection Inject 3.5 mg into the vein once a week. weekly     . chlorhexidine (PERIDEX) 0.12 % solution Use as directed 15 mLs in the mouth or throat 2 (two) times daily.     Marland Kitchen dexamethasone (DECADRON) 4 MG tablet Take 10 tablets (40 mg) on days 1, 8, and 15 of chemo. Repeat every 21 days. 30 tablet  3  . glipiZIDE (GLUCOTROL) 5 MG tablet Take 5 mg by mouth daily before breakfast.     . lenalidomide (REVLIMID) 20 MG capsule Take 1 capsule by mouth once daily for 14 days on, and 7 days off of a 21 day cycle. 14 capsule 0  . lisinopril-hydrochlorothiazide (PRINZIDE,ZESTORETIC) 20-25 MG tablet Take 1 tablet by mouth every morning.     . magnesium oxide (MAG-OX) 400 (241.3 Mg) MG tablet Take 1 tablet (400 mg total) by mouth 2 (two) times daily. (Patient taking differently: Take 400 mg by mouth in the morning, at noon, and at bedtime. ) 60 tablet 0  . metFORMIN (GLUCOPHAGE) 1000 MG tablet Take 1,000 mg by mouth 2 times daily at 12 noon and 4 pm.      . potassium chloride SA (KLOR-CON) 20 MEQ tablet TAKE (2) TABLETS BY MOUTH THREE TIMES DAILY. 168 tablet 11   No current facility-administered medications for this visit.    ALLERGIES:  Allergies  Allergen Reactions  . Motrin [Ibuprofen] Rash    PHYSICAL EXAM:  Performance status (ECOG): 1 - Symptomatic but completely ambulatory  Vitals:   07/17/20 1110  BP: (!) 144/55  Pulse: 64  Resp: 18  Temp: (!) 96.9 F (36.1 C)  SpO2: 100%   Wt Readings from Last 3 Encounters:  07/17/20 172 lb 6.4 oz (78.2 kg)  06/26/20 162 lb 6.4 oz (73.7 kg)  06/19/20 163 lb (73.9 kg)   Physical Exam Vitals reviewed.  Constitutional:      Appearance: Normal appearance.  Cardiovascular:     Rate and Rhythm: Normal rate and regular rhythm.     Pulses: Normal pulses.     Heart sounds: Normal heart sounds.  Pulmonary:     Effort: Pulmonary effort is normal.     Breath sounds: Normal breath sounds.  Musculoskeletal:     Right lower leg: Edema (1+) present.     Left lower leg: Edema (1+) present.  Neurological:     General: No focal deficit present.     Mental Status: She is alert and oriented to person, place, and time.  Psychiatric:        Mood and Affect: Mood normal.        Behavior: Behavior normal.     LABORATORY DATA:  I have reviewed the  labs as listed.  CBC Latest Ref Rng & Units 07/17/2020 07/10/2020 07/03/2020  WBC 4.0 - 10.5 K/uL 3.7(L) 4.3 4.9  Hemoglobin 12.0 - 15.0 g/dL 8.7(L) 10.0(L) 10.3(L)  Hematocrit 36 - 46 % 26.9(L) 30.7(L) 31.2(L)  Platelets 150 - 400 K/uL 188 136(L) 146(L)   CMP Latest Ref Rng & Units 07/17/2020 07/10/2020 07/03/2020  Glucose 70 - 99 mg/dL 127(H) 87 75  BUN 8 -  23 mg/dL 22 15 24(H)  Creatinine 0.44 - 1.00 mg/dL 0.96 0.98 1.01(H)  Sodium 135 - 145 mmol/L 142 142 141  Potassium 3.5 - 5.1 mmol/L 3.3(L) 3.0(L) 3.3(L)  Chloride 98 - 111 mmol/L 109 104 105  CO2 22 - 32 mmol/L _0 Calcium 8.9 - 10.3 mg/dL 8.4(L) 9.5 9.3  Total Protein 6.5 - 8.1 g/dL 6.5 6.3(L) 6.4(L)  Total Bilirubin 0.3 - 1.2 mg/dL 0.4 0.8 0.6  Alkaline Phos 38 - 126 U/L 56 63 59  AST 15 - 41 U/L 8(L) 13(L) 10(L)  ALT 0 - 44 U/L _1 Component Value Date/Time   RBC 2.65 (L) 07/17/2020 1039   MCV 101.5 (H) 07/17/2020 1039   MCH 32.8 07/17/2020 1039   MCHC 32.3 07/17/2020 1039   RDW 14.4 07/17/2020 1039   LYMPHSABS 0.9 07/17/2020 1039   MONOABS 0.4 07/17/2020 1039   EOSABS 0.2 07/17/2020 1039   BASOSABS 0.0 07/17/2020 1039   Lab Results  Component Value Date   LDH 118 07/10/2020   LDH 115 06/04/2020   LDH 143 05/14/2020   Lab Results  Component Value Date   TOTALPROTELP 6.0 07/10/2020   ALBUMINELP 3.5 07/10/2020   A1GS 0.1 07/10/2020   A2GS 0.7 07/10/2020   BETS 1.0 07/10/2020   GAMS 0.7 07/10/2020   MSPIKE 0.3 (H) 07/10/2020   SPEI Comment 07/10/2020    Lab Results  Component Value Date   KPAFRELGTCHN 28.4 (H) 07/10/2020   LAMBDASER 16.3 07/10/2020   KAPLAMBRATIO 1.74 (H) 07/10/2020     DIAGNOSTIC IMAGING:  I have independently reviewed the scans and discussed with the patient. No results found.   ASSESSMENT:  1. IgA kappa plasma cell myeloma, stage I: -RVD started on 01/09/2018, held since 02/14/2020 due to mandible abscess. -Myeloma labs on 05/14/2020 showed progression with M spike of  0.5 g. -RVD started back on 05/21/2020. -Myeloma labs on 07/10/2020 shows M spike improved to 0.3 g from 0.6 g previously.  Free light chain ratio is 1.74 with kappa light chains 28.4.  2.  Osteomyelitis of the right mandible/dental abscess: -Finished IV ceftriaxone on 04/03/2020.  Finished oral antibiotics. -We will hold Xgeva indefinitely.   PLAN:  1. IgA kappa plasma cell myeloma, stage I: -She will continue Velcade 3 weeks on 1 week off along with Revlimid. -I reviewed myeloma labs which showed improvement since restarting therapy. -She will continue Velcade injections along with Revlimid.  I plan to repeat her myeloma labs in 4 weeks and see her back in 5 weeks.  2. Severe hypokalemia: -Potassium today is 3.3.  According to her sister accompanying her, she is noncompliant with potassium.  Continue potassium 40 mEq 3 times a day.  3. Osteomyelitis of the right mandible/dental abscess: -No worsening of facial swelling.  4. Hypomagnesemia: -Continue magnesium twice daily at home.  5. 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 -We will start her back on Retacrit as her hemoglobin today is 8.7.  Orders placed this encounter:  No orders of the defined types were placed in this encounter.    Derek Jack, MD [place] [phone]   I, Milinda Antis, am acting as a scribe for Dr. Sanda Linger.  I, Derek Jack MD, have reviewed the above documentation for accuracy and completeness, and I agree with the above.

## 2020-07-24 ENCOUNTER — Other Ambulatory Visit: Payer: Self-pay

## 2020-07-24 ENCOUNTER — Inpatient Hospital Stay (HOSPITAL_COMMUNITY): Payer: Medicare Other

## 2020-07-24 VITALS — BP 136/58 | HR 73 | Temp 97.0°F | Resp 18

## 2020-07-24 DIAGNOSIS — D649 Anemia, unspecified: Secondary | ICD-10-CM

## 2020-07-24 DIAGNOSIS — Z5112 Encounter for antineoplastic immunotherapy: Secondary | ICD-10-CM | POA: Diagnosis not present

## 2020-07-24 DIAGNOSIS — C9000 Multiple myeloma not having achieved remission: Secondary | ICD-10-CM

## 2020-07-24 LAB — CBC WITH DIFFERENTIAL/PLATELET
Abs Immature Granulocytes: 0.01 10*3/uL (ref 0.00–0.07)
Basophils Absolute: 0 10*3/uL (ref 0.0–0.1)
Basophils Relative: 0 %
Eosinophils Absolute: 0.1 10*3/uL (ref 0.0–0.5)
Eosinophils Relative: 4 %
HCT: 31 % — ABNORMAL LOW (ref 36.0–46.0)
Hemoglobin: 10.1 g/dL — ABNORMAL LOW (ref 12.0–15.0)
Immature Granulocytes: 0 %
Lymphocytes Relative: 28 %
Lymphs Abs: 1.1 10*3/uL (ref 0.7–4.0)
MCH: 33.2 pg (ref 26.0–34.0)
MCHC: 32.6 g/dL (ref 30.0–36.0)
MCV: 102 fL — ABNORMAL HIGH (ref 80.0–100.0)
Monocytes Absolute: 0.4 10*3/uL (ref 0.1–1.0)
Monocytes Relative: 11 %
Neutro Abs: 2.2 10*3/uL (ref 1.7–7.7)
Neutrophils Relative %: 57 %
Platelets: 205 10*3/uL (ref 150–400)
RBC: 3.04 MIL/uL — ABNORMAL LOW (ref 3.87–5.11)
RDW: 15.5 % (ref 11.5–15.5)
WBC: 3.9 10*3/uL — ABNORMAL LOW (ref 4.0–10.5)
nRBC: 0.8 % — ABNORMAL HIGH (ref 0.0–0.2)

## 2020-07-24 LAB — COMPREHENSIVE METABOLIC PANEL
ALT: 9 U/L (ref 0–44)
AST: 9 U/L — ABNORMAL LOW (ref 15–41)
Albumin: 3.9 g/dL (ref 3.5–5.0)
Alkaline Phosphatase: 59 U/L (ref 38–126)
Anion gap: 12 (ref 5–15)
BUN: 22 mg/dL (ref 8–23)
CO2: 28 mmol/L (ref 22–32)
Calcium: 10.6 mg/dL — ABNORMAL HIGH (ref 8.9–10.3)
Chloride: 99 mmol/L (ref 98–111)
Creatinine, Ser: 1.13 mg/dL — ABNORMAL HIGH (ref 0.44–1.00)
GFR calc Af Amer: 53 mL/min — ABNORMAL LOW (ref >60)
GFR calc non Af Amer: 46 mL/min — ABNORMAL LOW (ref >60)
Glucose, Bld: 99 mg/dL (ref 70–99)
Potassium: 3.6 mmol/L (ref 3.5–5.1)
Sodium: 139 mmol/L (ref 135–145)
Total Bilirubin: 0.8 mg/dL (ref 0.3–1.2)
Total Protein: 7 g/dL (ref 6.5–8.1)

## 2020-07-24 LAB — MAGNESIUM: Magnesium: 1.5 mg/dL — ABNORMAL LOW (ref 1.7–2.4)

## 2020-07-24 MED ORDER — DEXAMETHASONE 4 MG PO TABS
ORAL_TABLET | ORAL | Status: AC
  Filled 2020-07-24: qty 10

## 2020-07-24 MED ORDER — PROCHLORPERAZINE MALEATE 10 MG PO TABS
ORAL_TABLET | ORAL | Status: AC
  Filled 2020-07-24: qty 1

## 2020-07-24 MED ORDER — PROCHLORPERAZINE MALEATE 10 MG PO TABS
10.0000 mg | ORAL_TABLET | Freq: Once | ORAL | Status: AC
  Administered 2020-07-24: 10 mg via ORAL

## 2020-07-24 MED ORDER — BORTEZOMIB CHEMO SQ INJECTION 3.5 MG (2.5MG/ML)
1.3000 mg/m2 | Freq: Once | INTRAMUSCULAR | Status: AC
  Administered 2020-07-24: 2.25 mg via SUBCUTANEOUS
  Filled 2020-07-24: qty 0.9

## 2020-07-24 MED ORDER — DEXAMETHASONE 4 MG PO TABS
40.0000 mg | ORAL_TABLET | Freq: Once | ORAL | Status: AC
  Administered 2020-07-24: 40 mg via ORAL

## 2020-07-24 MED ORDER — MAGNESIUM OXIDE 400 (241.3 MG) MG PO TABS
400.0000 mg | ORAL_TABLET | Freq: Once | ORAL | Status: AC
  Administered 2020-07-24: 400 mg via ORAL
  Filled 2020-07-24: qty 1

## 2020-07-24 NOTE — Progress Notes (Signed)
Patient is taking Revlimid and has not missed any doses and reports no side effects at this time.

## 2020-07-24 NOTE — Patient Instructions (Signed)
[facility] Discharge Instructions for Patients Receiving Chemotherapy  Today you received the following chemotherapy agents   To help prevent nausea and vomiting after your treatment, we encourage you to take your nausea medication   If you develop nausea and vomiting that is not controlled by your nausea medication, call the clinic.   BELOW ARE SYMPTOMS THAT SHOULD BE REPORTED IMMEDIATELY:  *FEVER GREATER THAN 100.5 F  *CHILLS WITH OR WITHOUT FEVER  NAUSEA AND VOMITING THAT IS NOT CONTROLLED WITH YOUR NAUSEA MEDICATION  *UNUSUAL SHORTNESS OF BREATH  *UNUSUAL BRUISING OR BLEEDING  TENDERNESS IN MOUTH AND THROAT WITH OR WITHOUT PRESENCE OF ULCERS  *URINARY PROBLEMS  *BOWEL PROBLEMS  UNUSUAL RASH Items with * indicate a potential emergency and should be followed up as soon as possible.  Feel free to call the clinic should you have any questions or concerns. The clinic phone number is [phone].  Please show the CHEMO ALERT CARD at check-in to the Emergency Department and triage nurse.   

## 2020-07-24 NOTE — Progress Notes (Signed)
Patient presents today for Velcade injection. Magnesium 1.5 today. Vital signs within parameters for treatment.   Labs reviewed by [facility] NP. Verbal order received to give 400mg  of Magnesium Oxide ( MAG-OX) PO x 1 dose now.   Velcade given today per MD orders. Tolerated without adverse affects. Vital signs stable. No complaints at this time. Discharged from clinic ambulatory. F/U with [facility] as scheduled.

## 2020-07-31 ENCOUNTER — Encounter (HOSPITAL_COMMUNITY): Payer: Self-pay

## 2020-07-31 ENCOUNTER — Inpatient Hospital Stay (HOSPITAL_COMMUNITY): Payer: Medicare Other

## 2020-07-31 ENCOUNTER — Other Ambulatory Visit: Payer: Self-pay

## 2020-07-31 ENCOUNTER — Other Ambulatory Visit (HOSPITAL_COMMUNITY): Payer: Self-pay | Admitting: *Deleted

## 2020-07-31 ENCOUNTER — Inpatient Hospital Stay (HOSPITAL_COMMUNITY): Payer: Medicare Other | Attending: Hematology

## 2020-07-31 VITALS — BP 124/52 | HR 68 | Temp 97.2°F | Resp 18

## 2020-07-31 DIAGNOSIS — Z5112 Encounter for antineoplastic immunotherapy: Secondary | ICD-10-CM | POA: Diagnosis present

## 2020-07-31 DIAGNOSIS — N189 Chronic kidney disease, unspecified: Secondary | ICD-10-CM | POA: Diagnosis present

## 2020-07-31 DIAGNOSIS — D649 Anemia, unspecified: Secondary | ICD-10-CM

## 2020-07-31 DIAGNOSIS — E876 Hypokalemia: Secondary | ICD-10-CM | POA: Diagnosis not present

## 2020-07-31 DIAGNOSIS — D6481 Anemia due to antineoplastic chemotherapy: Secondary | ICD-10-CM | POA: Diagnosis not present

## 2020-07-31 DIAGNOSIS — D631 Anemia in chronic kidney disease: Secondary | ICD-10-CM | POA: Diagnosis not present

## 2020-07-31 DIAGNOSIS — E538 Deficiency of other specified B group vitamins: Secondary | ICD-10-CM | POA: Insufficient documentation

## 2020-07-31 DIAGNOSIS — C9000 Multiple myeloma not having achieved remission: Secondary | ICD-10-CM

## 2020-07-31 DIAGNOSIS — T451X5A Adverse effect of antineoplastic and immunosuppressive drugs, initial encounter: Secondary | ICD-10-CM | POA: Diagnosis not present

## 2020-07-31 LAB — CBC WITH DIFFERENTIAL/PLATELET
Abs Immature Granulocytes: 0.01 10*3/uL (ref 0.00–0.07)
Basophils Absolute: 0 10*3/uL (ref 0.0–0.1)
Basophils Relative: 0 %
Eosinophils Absolute: 0.1 10*3/uL (ref 0.0–0.5)
Eosinophils Relative: 3 %
HCT: 30.1 % — ABNORMAL LOW (ref 36.0–46.0)
Hemoglobin: 9.7 g/dL — ABNORMAL LOW (ref 12.0–15.0)
Immature Granulocytes: 0 %
Lymphocytes Relative: 32 %
Lymphs Abs: 1.2 10*3/uL (ref 0.7–4.0)
MCH: 32.8 pg (ref 26.0–34.0)
MCHC: 32.2 g/dL (ref 30.0–36.0)
MCV: 101.7 fL — ABNORMAL HIGH (ref 80.0–100.0)
Monocytes Absolute: 0.4 10*3/uL (ref 0.1–1.0)
Monocytes Relative: 11 %
Neutro Abs: 2 10*3/uL (ref 1.7–7.7)
Neutrophils Relative %: 54 %
Platelets: 168 10*3/uL (ref 150–400)
RBC: 2.96 MIL/uL — ABNORMAL LOW (ref 3.87–5.11)
RDW: 15.5 % (ref 11.5–15.5)
WBC: 3.7 10*3/uL — ABNORMAL LOW (ref 4.0–10.5)
nRBC: 0 % (ref 0.0–0.2)

## 2020-07-31 LAB — COMPREHENSIVE METABOLIC PANEL
ALT: 8 U/L (ref 0–44)
AST: 10 U/L — ABNORMAL LOW (ref 15–41)
Albumin: 3.7 g/dL (ref 3.5–5.0)
Alkaline Phosphatase: 63 U/L (ref 38–126)
Anion gap: 10 (ref 5–15)
BUN: 24 mg/dL — ABNORMAL HIGH (ref 8–23)
CO2: 25 mmol/L (ref 22–32)
Calcium: 9.7 mg/dL (ref 8.9–10.3)
Chloride: 105 mmol/L (ref 98–111)
Creatinine, Ser: 0.97 mg/dL (ref 0.44–1.00)
GFR calc Af Amer: >60 mL/min (ref >60)
GFR calc non Af Amer: 55 mL/min — ABNORMAL LOW (ref >60)
Glucose, Bld: 104 mg/dL — ABNORMAL HIGH (ref 70–99)
Potassium: 3.1 mmol/L — ABNORMAL LOW (ref 3.5–5.1)
Sodium: 140 mmol/L (ref 135–145)
Total Bilirubin: 0.8 mg/dL (ref 0.3–1.2)
Total Protein: 6.8 g/dL (ref 6.5–8.1)

## 2020-07-31 LAB — MAGNESIUM: Magnesium: 1.4 mg/dL — ABNORMAL LOW (ref 1.7–2.4)

## 2020-07-31 MED ORDER — MAGNESIUM SULFATE 2 GM/50ML IV SOLN
2.0000 g | Freq: Once | INTRAVENOUS | Status: AC
  Administered 2020-07-31: 2 g via INTRAVENOUS
  Filled 2020-07-31: qty 50

## 2020-07-31 MED ORDER — PROCHLORPERAZINE MALEATE 10 MG PO TABS
10.0000 mg | ORAL_TABLET | Freq: Once | ORAL | Status: AC
  Administered 2020-07-31: 10 mg via ORAL
  Filled 2020-07-31: qty 1

## 2020-07-31 MED ORDER — DEXAMETHASONE 4 MG PO TABS
40.0000 mg | ORAL_TABLET | Freq: Once | ORAL | Status: AC
  Administered 2020-07-31: 40 mg via ORAL
  Filled 2020-07-31: qty 10

## 2020-07-31 MED ORDER — POTASSIUM CHLORIDE CRYS ER 20 MEQ PO TBCR
40.0000 meq | EXTENDED_RELEASE_TABLET | Freq: Once | ORAL | Status: AC
  Administered 2020-07-31: 40 meq via ORAL
  Filled 2020-07-31: qty 2

## 2020-07-31 MED ORDER — LENALIDOMIDE 20 MG PO CAPS: ORAL_CAPSULE | ORAL | 0 refills | Status: DC

## 2020-07-31 MED ORDER — BORTEZOMIB CHEMO SQ INJECTION 3.5 MG (2.5MG/ML)
1.3000 mg/m2 | Freq: Once | INTRAMUSCULAR | Status: AC
  Administered 2020-07-31: 2.25 mg via SUBCUTANEOUS
  Filled 2020-07-31: qty 0.9

## 2020-07-31 MED ORDER — SODIUM CHLORIDE 0.9 % IV SOLN: INTRAVENOUS | Status: DC

## 2020-07-31 NOTE — Telephone Encounter (Signed)
Patient's chart reviewed, per Dr. Tomie China office notes patient is to continue taking Revlimid.  Refill sent to pharmacy.

## 2020-07-31 NOTE — Patient Instructions (Signed)
[facility] Discharge Instructions for Patients Receiving Chemotherapy   Beginning January 23rd 2017 lab work for the [facility] will be done in the  Main lab at [facility] on 1st floor. If you have a lab appointment with the [facility] please come in thru the  Main Entrance and check in at the main information desk   Today you received the following chemotherapy agents Velcade injection as well as Magnesium infusion. Follow-up as scheduled  To help prevent nausea and vomiting after your treatment, we encourage you to take your nausea medication   If you develop nausea and vomiting, or diarrhea that is not controlled by your medication, call the clinic.  The clinic phone number is [phone]. Office hours are Monday-Friday 8:30am-5:00pm.  BELOW ARE SYMPTOMS THAT SHOULD BE REPORTED IMMEDIATELY:  *FEVER GREATER THAN 101.0 F  *CHILLS WITH OR WITHOUT FEVER  NAUSEA AND VOMITING THAT IS NOT CONTROLLED WITH YOUR NAUSEA MEDICATION  *UNUSUAL SHORTNESS OF BREATH  *UNUSUAL BRUISING OR BLEEDING  TENDERNESS IN MOUTH AND THROAT WITH OR WITHOUT PRESENCE OF ULCERS  *URINARY PROBLEMS  *BOWEL PROBLEMS  UNUSUAL RASH Items with * indicate a potential emergency and should be followed up as soon as possible. If you have an emergency after office hours please contact your primary care physician or go to the nearest emergency department.  Please call the clinic during office hours if you have any questions or concerns.   You may also contact the Patient Navigator at [phone] should you have any questions or need assistance in obtaining follow up care.      Resources For Cancer Patients and their Caregivers ? American Cancer Society: Can assist with transportation, wigs, general needs, runs Look Good Feel Better.        361-373-4730 ? Cancer Care: Provides financial assistance, online support groups, medication/co-pay assistance.  1-800-813-HOPE  6035469776) ? [employer] Assists [place] cancer patients and their families through emotional , educational and financial support.  8674838077 ? [place] DSS Where to apply for food stamps, Medicaid and utility assistance. 780-647-0922 ? [employer]: Transportation to medical appointments. 907-680-4955 ? Social Security Administration: May apply for disability if have a Stage IV cancer. (301)287-7057 (734)764-9455 ? [employer], Disability and Transit Services: Assists with nutrition, care and transit needs. 540-713-6568

## 2020-07-31 NOTE — Progress Notes (Signed)
Pt needs refill on her Revlimid so Jene Every, nurse navigator, informed and she will take care of this

## 2020-07-31 NOTE — Progress Notes (Signed)
[place] reviewed with RLockamy NP and pt approved for Velcade injection today as well as Magnesium 2 grams IV and Potassium 40 meq PO per MD                                                       Yves Dill Vantol tolerated Velcade injection and Magnesium infusion well without complaints or incident.Peripheral IV site checked with positive blood return noted prior to and after infusion VSS upon discharge. Pt discharged self ambulatory in satisfactory condition accompanied by family member

## 2020-08-07 ENCOUNTER — Other Ambulatory Visit: Payer: Self-pay

## 2020-08-07 ENCOUNTER — Inpatient Hospital Stay (HOSPITAL_COMMUNITY): Payer: Medicare Other

## 2020-08-07 ENCOUNTER — Encounter (HOSPITAL_COMMUNITY): Payer: Self-pay

## 2020-08-07 VITALS — BP 114/69 | HR 60 | Temp 97.2°F | Resp 18

## 2020-08-07 DIAGNOSIS — C9000 Multiple myeloma not having achieved remission: Secondary | ICD-10-CM

## 2020-08-07 DIAGNOSIS — Z5112 Encounter for antineoplastic immunotherapy: Secondary | ICD-10-CM | POA: Diagnosis not present

## 2020-08-07 DIAGNOSIS — D649 Anemia, unspecified: Secondary | ICD-10-CM

## 2020-08-07 DIAGNOSIS — E538 Deficiency of other specified B group vitamins: Secondary | ICD-10-CM

## 2020-08-07 LAB — COMPREHENSIVE METABOLIC PANEL
ALT: 10 U/L (ref 0–44)
AST: 12 U/L — ABNORMAL LOW (ref 15–41)
Albumin: 3.8 g/dL (ref 3.5–5.0)
Alkaline Phosphatase: 58 U/L (ref 38–126)
Anion gap: 9 (ref 5–15)
BUN: 22 mg/dL (ref 8–23)
CO2: 24 mmol/L (ref 22–32)
Calcium: 9.6 mg/dL (ref 8.9–10.3)
Chloride: 108 mmol/L (ref 98–111)
Creatinine, Ser: 1 mg/dL (ref 0.44–1.00)
GFR calc Af Amer: >60 mL/min (ref >60)
GFR calc non Af Amer: 53 mL/min — ABNORMAL LOW (ref >60)
Glucose, Bld: 99 mg/dL (ref 70–99)
Potassium: 3.6 mmol/L (ref 3.5–5.1)
Sodium: 141 mmol/L (ref 135–145)
Total Bilirubin: 0.9 mg/dL (ref 0.3–1.2)
Total Protein: 6.4 g/dL — ABNORMAL LOW (ref 6.5–8.1)

## 2020-08-07 LAB — CBC WITH DIFFERENTIAL/PLATELET
Abs Immature Granulocytes: 0.02 10*3/uL (ref 0.00–0.07)
Basophils Absolute: 0 10*3/uL (ref 0.0–0.1)
Basophils Relative: 1 %
Eosinophils Absolute: 0.1 10*3/uL (ref 0.0–0.5)
Eosinophils Relative: 1 %
HCT: 29.7 % — ABNORMAL LOW (ref 36.0–46.0)
Hemoglobin: 9.6 g/dL — ABNORMAL LOW (ref 12.0–15.0)
Immature Granulocytes: 1 %
Lymphocytes Relative: 27 %
Lymphs Abs: 1.2 10*3/uL (ref 0.7–4.0)
MCH: 33 pg (ref 26.0–34.0)
MCHC: 32.3 g/dL (ref 30.0–36.0)
MCV: 102.1 fL — ABNORMAL HIGH (ref 80.0–100.0)
Monocytes Absolute: 0.5 10*3/uL (ref 0.1–1.0)
Monocytes Relative: 12 %
Neutro Abs: 2.5 10*3/uL (ref 1.7–7.7)
Neutrophils Relative %: 58 %
Platelets: 151 10*3/uL (ref 150–400)
RBC: 2.91 MIL/uL — ABNORMAL LOW (ref 3.87–5.11)
RDW: 15.9 % — ABNORMAL HIGH (ref 11.5–15.5)
WBC: 4.2 10*3/uL (ref 4.0–10.5)
nRBC: 0.5 % — ABNORMAL HIGH (ref 0.0–0.2)

## 2020-08-07 MED ORDER — EPOETIN ALFA-EPBX 10000 UNIT/ML IJ SOLN
20000.0000 [IU] | Freq: Once | INTRAMUSCULAR | Status: AC
  Administered 2020-08-07: 20000 [IU] via SUBCUTANEOUS
  Filled 2020-08-07: qty 2

## 2020-08-07 MED ORDER — CYANOCOBALAMIN 1000 MCG/ML IJ SOLN
1000.0000 ug | Freq: Once | INTRAMUSCULAR | Status: AC
  Administered 2020-08-07: 1000 ug via INTRAMUSCULAR
  Filled 2020-08-07: qty 1

## 2020-08-07 NOTE — Progress Notes (Signed)
Patient presents today for Retacrit injection and B12 shot. Hemoglobin reviewed prior to administration. VSS. Injection tolerated without incident or complaint. See MAR for details. Patient discharged in satisfactory condition with follow up instructions.

## 2020-08-14 ENCOUNTER — Other Ambulatory Visit: Payer: Self-pay

## 2020-08-14 ENCOUNTER — Inpatient Hospital Stay (HOSPITAL_COMMUNITY): Payer: Medicare Other

## 2020-08-14 ENCOUNTER — Encounter (HOSPITAL_COMMUNITY): Payer: Self-pay

## 2020-08-14 VITALS — BP 156/60 | HR 64 | Temp 96.9°F | Resp 18 | Wt 167.0 lb

## 2020-08-14 DIAGNOSIS — C9000 Multiple myeloma not having achieved remission: Secondary | ICD-10-CM

## 2020-08-14 DIAGNOSIS — Z5112 Encounter for antineoplastic immunotherapy: Secondary | ICD-10-CM | POA: Diagnosis not present

## 2020-08-14 LAB — COMPREHENSIVE METABOLIC PANEL
ALT: 9 U/L (ref 0–44)
AST: 11 U/L — ABNORMAL LOW (ref 15–41)
Albumin: 3.7 g/dL (ref 3.5–5.0)
Alkaline Phosphatase: 59 U/L (ref 38–126)
Anion gap: 6 (ref 5–15)
BUN: 22 mg/dL (ref 8–23)
CO2: 24 mmol/L (ref 22–32)
Calcium: 9.6 mg/dL (ref 8.9–10.3)
Chloride: 109 mmol/L (ref 98–111)
Creatinine, Ser: 1.04 mg/dL — ABNORMAL HIGH (ref 0.44–1.00)
GFR calc Af Amer: 59 mL/min — ABNORMAL LOW (ref >60)
GFR calc non Af Amer: 51 mL/min — ABNORMAL LOW (ref >60)
Glucose, Bld: 89 mg/dL (ref 70–99)
Potassium: 4.2 mmol/L (ref 3.5–5.1)
Sodium: 139 mmol/L (ref 135–145)
Total Bilirubin: 0.8 mg/dL (ref 0.3–1.2)
Total Protein: 7 g/dL (ref 6.5–8.1)

## 2020-08-14 LAB — CBC WITH DIFFERENTIAL/PLATELET
Abs Immature Granulocytes: 0.01 10*3/uL (ref 0.00–0.07)
Basophils Absolute: 0 10*3/uL (ref 0.0–0.1)
Basophils Relative: 1 %
Eosinophils Absolute: 0.1 10*3/uL (ref 0.0–0.5)
Eosinophils Relative: 2 %
HCT: 31.7 % — ABNORMAL LOW (ref 36.0–46.0)
Hemoglobin: 10 g/dL — ABNORMAL LOW (ref 12.0–15.0)
Immature Granulocytes: 0 %
Lymphocytes Relative: 24 %
Lymphs Abs: 1 10*3/uL (ref 0.7–4.0)
MCH: 32.8 pg (ref 26.0–34.0)
MCHC: 31.5 g/dL (ref 30.0–36.0)
MCV: 103.9 fL — ABNORMAL HIGH (ref 80.0–100.0)
Monocytes Absolute: 0.4 10*3/uL (ref 0.1–1.0)
Monocytes Relative: 9 %
Neutro Abs: 2.6 10*3/uL (ref 1.7–7.7)
Neutrophils Relative %: 64 %
Platelets: 214 10*3/uL (ref 150–400)
RBC: 3.05 MIL/uL — ABNORMAL LOW (ref 3.87–5.11)
RDW: 17.1 % — ABNORMAL HIGH (ref 11.5–15.5)
WBC: 4 10*3/uL (ref 4.0–10.5)
nRBC: 0 % (ref 0.0–0.2)

## 2020-08-14 LAB — MAGNESIUM: Magnesium: 1.6 mg/dL — ABNORMAL LOW (ref 1.7–2.4)

## 2020-08-14 LAB — LACTATE DEHYDROGENASE: LDH: 148 U/L (ref 98–192)

## 2020-08-14 MED ORDER — DEXAMETHASONE 4 MG PO TABS
40.0000 mg | ORAL_TABLET | Freq: Once | ORAL | Status: AC
  Administered 2020-08-14: 40 mg via ORAL
  Filled 2020-08-14: qty 10

## 2020-08-14 MED ORDER — PROCHLORPERAZINE MALEATE 10 MG PO TABS
10.0000 mg | ORAL_TABLET | Freq: Once | ORAL | Status: AC
  Administered 2020-08-14: 10 mg via ORAL
  Filled 2020-08-14: qty 1

## 2020-08-14 MED ORDER — BORTEZOMIB CHEMO SQ INJECTION 3.5 MG (2.5MG/ML)
1.3000 mg/m2 | Freq: Once | INTRAMUSCULAR | Status: AC
  Administered 2020-08-14: 2.25 mg via SUBCUTANEOUS
  Filled 2020-08-14: qty 0.9

## 2020-08-14 NOTE — Progress Notes (Signed)
Patient taking Revlimid as prescribed. Has not missed any doses & reports no side effects.

## 2020-08-15 LAB — KAPPA/LAMBDA LIGHT CHAINS
Kappa free light chain: 24.8 mg/L — ABNORMAL HIGH (ref 3.3–19.4)
Kappa, lambda light chain ratio: 2.23 — ABNORMAL HIGH (ref 0.26–1.65)
Lambda free light chains: 11.1 mg/L (ref 5.7–26.3)

## 2020-08-16 LAB — PROTEIN ELECTROPHORESIS, SERUM
A/G Ratio: 1.3 (ref 0.7–1.7)
Albumin ELP: 3.6 g/dL (ref 2.9–4.4)
Alpha-1-Globulin: 0.2 g/dL (ref 0.0–0.4)
Alpha-2-Globulin: 0.8 g/dL (ref 0.4–1.0)
Beta Globulin: 1 g/dL (ref 0.7–1.3)
Gamma Globulin: 0.8 g/dL (ref 0.4–1.8)
Globulin, Total: 2.7 g/dL (ref 2.2–3.9)
M-Spike, %: 0.4 g/dL — ABNORMAL HIGH
Total Protein ELP: 6.3 g/dL (ref 6.0–8.5)

## 2020-08-21 ENCOUNTER — Encounter (HOSPITAL_COMMUNITY): Payer: Self-pay

## 2020-08-21 ENCOUNTER — Inpatient Hospital Stay (HOSPITAL_BASED_OUTPATIENT_CLINIC_OR_DEPARTMENT_OTHER): Payer: Medicare Other | Admitting: Hematology

## 2020-08-21 ENCOUNTER — Other Ambulatory Visit: Payer: Self-pay

## 2020-08-21 ENCOUNTER — Inpatient Hospital Stay (HOSPITAL_COMMUNITY): Payer: Medicare Other

## 2020-08-21 ENCOUNTER — Encounter (HOSPITAL_COMMUNITY): Payer: Self-pay | Admitting: Hematology

## 2020-08-21 DIAGNOSIS — Z5112 Encounter for antineoplastic immunotherapy: Secondary | ICD-10-CM | POA: Diagnosis not present

## 2020-08-21 DIAGNOSIS — C9000 Multiple myeloma not having achieved remission: Secondary | ICD-10-CM

## 2020-08-21 DIAGNOSIS — E538 Deficiency of other specified B group vitamins: Secondary | ICD-10-CM

## 2020-08-21 LAB — COMPREHENSIVE METABOLIC PANEL
ALT: 8 U/L (ref 0–44)
AST: 12 U/L — ABNORMAL LOW (ref 15–41)
Albumin: 3.8 g/dL (ref 3.5–5.0)
Alkaline Phosphatase: 54 U/L (ref 38–126)
Anion gap: 12 (ref 5–15)
BUN: 23 mg/dL (ref 8–23)
CO2: 21 mmol/L — ABNORMAL LOW (ref 22–32)
Calcium: 9.4 mg/dL (ref 8.9–10.3)
Chloride: 103 mmol/L (ref 98–111)
Creatinine, Ser: 1.06 mg/dL — ABNORMAL HIGH (ref 0.44–1.00)
GFR calc Af Amer: 57 mL/min — ABNORMAL LOW (ref >60)
GFR calc non Af Amer: 50 mL/min — ABNORMAL LOW (ref >60)
Glucose, Bld: 178 mg/dL — ABNORMAL HIGH (ref 70–99)
Potassium: 3.4 mmol/L — ABNORMAL LOW (ref 3.5–5.1)
Sodium: 136 mmol/L (ref 135–145)
Total Bilirubin: 1.5 mg/dL — ABNORMAL HIGH (ref 0.3–1.2)
Total Protein: 6.7 g/dL (ref 6.5–8.1)

## 2020-08-21 LAB — CBC WITH DIFFERENTIAL/PLATELET
Abs Immature Granulocytes: 0.02 10*3/uL (ref 0.00–0.07)
Basophils Absolute: 0 10*3/uL (ref 0.0–0.1)
Basophils Relative: 0 %
Eosinophils Absolute: 0.1 10*3/uL (ref 0.0–0.5)
Eosinophils Relative: 1 %
HCT: 30.9 % — ABNORMAL LOW (ref 36.0–46.0)
Hemoglobin: 10.2 g/dL — ABNORMAL LOW (ref 12.0–15.0)
Immature Granulocytes: 0 %
Lymphocytes Relative: 18 %
Lymphs Abs: 1 10*3/uL (ref 0.7–4.0)
MCH: 33.8 pg (ref 26.0–34.0)
MCHC: 33 g/dL (ref 30.0–36.0)
MCV: 102.3 fL — ABNORMAL HIGH (ref 80.0–100.0)
Monocytes Absolute: 0.6 10*3/uL (ref 0.1–1.0)
Monocytes Relative: 10 %
Neutro Abs: 3.9 10*3/uL (ref 1.7–7.7)
Neutrophils Relative %: 71 %
Platelets: 160 10*3/uL (ref 150–400)
RBC: 3.02 MIL/uL — ABNORMAL LOW (ref 3.87–5.11)
RDW: 17 % — ABNORMAL HIGH (ref 11.5–15.5)
WBC: 5.5 10*3/uL (ref 4.0–10.5)
nRBC: 0 % (ref 0.0–0.2)

## 2020-08-21 LAB — MAGNESIUM: Magnesium: 1.5 mg/dL — ABNORMAL LOW (ref 1.7–2.4)

## 2020-08-21 MED ORDER — BORTEZOMIB CHEMO SQ INJECTION 3.5 MG (2.5MG/ML)
1.3000 mg/m2 | Freq: Once | INTRAMUSCULAR | Status: AC
  Administered 2020-08-21: 2.25 mg via SUBCUTANEOUS
  Filled 2020-08-21: qty 0.9

## 2020-08-21 MED ORDER — PROCHLORPERAZINE MALEATE 10 MG PO TABS
10.0000 mg | ORAL_TABLET | Freq: Once | ORAL | Status: AC
  Administered 2020-08-21: 10 mg via ORAL
  Filled 2020-08-21: qty 1

## 2020-08-21 MED ORDER — MAGNESIUM OXIDE 400 (241.3 MG) MG PO TABS
400.0000 mg | ORAL_TABLET | Freq: Three times a day (TID) | ORAL | 6 refills | Status: DC
Start: 2020-08-21 — End: 2022-01-22

## 2020-08-21 MED ORDER — EPOETIN ALFA-EPBX 10000 UNIT/ML IJ SOLN: 20000.0000 [IU] | Freq: Once | INTRAMUSCULAR | Status: DC

## 2020-08-21 MED ORDER — DEXAMETHASONE 4 MG PO TABS
40.0000 mg | ORAL_TABLET | Freq: Once | ORAL | Status: AC
  Administered 2020-08-21: 40 mg via ORAL
  Filled 2020-08-21: qty 10

## 2020-08-21 NOTE — Patient Instructions (Signed)
[place] at [facility] Discharge Instructions  You were seen today by Dr. Delton Coombes. He went over your recent results. You received your injection today; continue getting your weekly injections. Start taking the magnesium three times daily. Dr. Delton Coombes will see you back in 4 weeks for labs and follow up.   Thank you for choosing [place] at [facility] to provide your oncology and hematology care.  To afford each patient quality time with our provider, please arrive at least 15 minutes before your scheduled appointment time.   If you have a lab appointment with the [facility] please come in thru the Main Entrance and check in at the main information desk  You need to re-schedule your appointment should you arrive 10 or more minutes late.  We strive to give you quality time with our providers, and arriving late affects you and other patients whose appointments are after yours.  Also, if you no show three or more times for appointments you may be dismissed from the clinic at the providers discretion.     Again, thank you for choosing [facility].  Our hope is that these requests will decrease the amount of time that you wait before being seen by our physicians.       _____________________________________________________________  Should you have questions after your visit to [facility], please contact our office at [phone] between the hours of 8:00 a.m. and 4:30 p.m.  Voicemails left after 4:00 p.m. will not be returned until the following business day.  For prescription refill requests, have your pharmacy contact our office and allow 72 hours.    Cancer Center Support Programs:   > Cancer Support Group  2nd Tuesday of the month 1pm-2pm, Journey Room

## 2020-08-21 NOTE — Progress Notes (Signed)
Patient was assessed by Dr. Delton Coombes and labs have been reviewed.  He is aware of potassium, no changes at this time, magnesium is 1.5 today, no orders at this time.  Patient is okay to proceed with treatment today. Primary RN and pharmacy aware.

## 2020-08-21 NOTE — Progress Notes (Signed)
Patient presents today for treatment and follow visit with Dr. Delton Coombes. Message received from [place] RN/ Dr. Delton Coombes to proceed with treatment today. Labs reviewed by MD. Hbg 10.2 today. No Retacrit today per CJones RPH.   Treatment given today per MD orders. Tolerated  without adverse affects. Vital signs stable. No complaints at this time. Discharged from clinic ambulatory. F/U with [facility] as scheduled.

## 2020-08-21 NOTE — Patient Instructions (Signed)
[facility] Discharge Instructions for Patients Receiving Chemotherapy  Today you received the following chemotherapy agents   To help prevent nausea and vomiting after your treatment, we encourage you to take your nausea medication   If you develop nausea and vomiting that is not controlled by your nausea medication, call the clinic.   BELOW ARE SYMPTOMS THAT SHOULD BE REPORTED IMMEDIATELY:  *FEVER GREATER THAN 100.5 F  *CHILLS WITH OR WITHOUT FEVER  NAUSEA AND VOMITING THAT IS NOT CONTROLLED WITH YOUR NAUSEA MEDICATION  *UNUSUAL SHORTNESS OF BREATH  *UNUSUAL BRUISING OR BLEEDING  TENDERNESS IN MOUTH AND THROAT WITH OR WITHOUT PRESENCE OF ULCERS  *URINARY PROBLEMS  *BOWEL PROBLEMS  UNUSUAL RASH Items with * indicate a potential emergency and should be followed up as soon as possible.  Feel free to call the clinic should you have any questions or concerns. The clinic phone number is [phone].  Please show the CHEMO ALERT CARD at check-in to the Emergency Department and triage nurse.   

## 2020-08-21 NOTE — Progress Notes (Signed)
[place], [place] [address]   CLINIC:  Medical Oncology/Hematology  PCP:  [name], PA-C [address] [place] [address] [phone]   REASON FOR VISIT:  Follow-up for multiple myeloma  PRIOR THERAPY: None  NGS Results: Not done  CURRENT THERAPY: Bortezomib  BRIEF ONCOLOGIC HISTORY:  Oncology History  Multiple myeloma not having achieved remission ([place])  01/20/2018 Initial Diagnosis   Multiple myeloma not having achieved remission ([place])   01/26/2018 -  Chemotherapy   The patient had dexamethasone (DECADRON) tablet 40 mg, 40 mg (100 % of original dose 40 mg), Oral,  Once, 16 of 16 cycles Dose modification: 40 mg (original dose 40 mg, Cycle 17), 40 mg (original dose 40 mg, Cycle 20) Administration: 40 mg (04/24/2019), 40 mg (05/23/2019), 40 mg (06/20/2019), 40 mg (07/11/2019), 40 mg (07/18/2019), 40 mg (07/25/2019), 40 mg (08/08/2019), 40 mg (08/15/2019), 40 mg (08/22/2019), 40 mg (09/05/2019), 40 mg (09/12/2019), 40 mg (09/19/2019), 40 mg (10/03/2019), 40 mg (10/10/2019), 40 mg (10/17/2019), 40 mg (10/31/2019), 40 mg (11/07/2019), 40 mg (11/14/2019), 40 mg (11/29/2019), 40 mg (12/06/2019), 40 mg (12/13/2019), 40 mg (01/03/2020), 40 mg (01/10/2020), 40 mg (01/17/2020), 40 mg (01/31/2020), 40 mg (02/07/2020), 40 mg (02/14/2020), 40 mg (04/17/2020), 40 mg (05/21/2020), 40 mg (06/04/2020), 40 mg (06/19/2020), 40 mg (06/26/2020), 40 mg (07/03/2020), 40 mg (07/17/2020), 40 mg (07/24/2020), 40 mg (07/31/2020), 40 mg (08/14/2020) dexamethasone (DECADRON) 4 MG tablet, 1 of 1 cycle, Start date: 04/24/2019, End date: -- lenalidomide (REVLIMID) 20 MG capsule, 1 of 1 cycle, Start date: 07/31/2020, End date: -- bortezomib SQ (VELCADE) chemo injection 2.5 mg, 1.3 mg/m2 = 2.5 mg, Subcutaneous,  Once, 32 of 32 cycles Administration: 2.5 mg (01/26/2018), 2.5 mg (02/02/2018), 2.5 mg (02/09/2018), 2.5 mg (02/16/2018), 2.5 mg (02/23/2018), 2.5 mg (03/02/2018), 2.5 mg (03/09/2018), 2.5 mg  (03/30/2018), 2.5 mg (04/06/2018), 2.5 mg (04/13/2018), 2.5 mg (04/21/2018), 2.5 mg (05/06/2018), 2.5 mg (05/11/2018), 2.5 mg (05/20/2018), 2.5 mg (05/27/2018), 2.5 mg (06/10/2018), 2.5 mg (06/17/2018), 2.5 mg (06/24/2018), 2.5 mg (07/08/2018), 2.5 mg (07/15/2018), 2.5 mg (07/22/2018), 2.5 mg (08/05/2018), 2.5 mg (08/12/2018), 2.5 mg (09/02/2018), 2.5 mg (09/09/2018), 2.5 mg (09/16/2018), 2.5 mg (09/30/2018), 2.5 mg (10/07/2018), 2.5 mg (10/14/2018), 2.5 mg (10/31/2018), 2.5 mg (11/07/2018), 2.5 mg (11/14/2018), 2.5 mg (11/28/2018), 2.25 mg (12/05/2018), 2.25 mg (12/12/2018), 2.25 mg (12/26/2018), 2.25 mg (01/02/2019), 2.25 mg (01/09/2019), 2.25 mg (01/23/2019), 2.25 mg (01/30/2019), 2.25 mg (02/06/2019), 2.25 mg (02/20/2019), 2.25 mg (02/27/2019), 2.25 mg (03/06/2019), 2.25 mg (03/20/2019), 2.25 mg (03/27/2019), 2.25 mg (04/03/2019), 2.25 mg (04/17/2019), 2.25 mg (04/24/2019), 2.25 mg (05/01/2019), 2.25 mg (05/15/2019), 2.25 mg (05/23/2019), 2.25 mg (05/30/2019), 2.25 mg (06/13/2019), 2.25 mg (06/20/2019), 2.25 mg (06/27/2019), 2.25 mg (07/11/2019), 2.25 mg (07/18/2019), 2.25 mg (07/25/2019), 2.25 mg (08/08/2019), 2.25 mg (08/15/2019), 2.25 mg (08/22/2019), 2.25 mg (09/05/2019), 2.25 mg (09/12/2019), 2.25 mg (09/19/2019), 2.25 mg (10/03/2019), 2.25 mg (10/10/2019), 2.25 mg (10/17/2019), 2.25 mg (10/31/2019), 2.25 mg (11/07/2019), 2.25 mg (11/14/2019), 2.25 mg (11/29/2019), 2.25 mg (12/06/2019), 2.25 mg (12/13/2019), 2.25 mg (01/03/2020), 2.25 mg (01/10/2020), 2.25 mg (01/17/2020), 2.25 mg (01/31/2020), 2.25 mg (02/07/2020), 2.25 mg (02/14/2020), 2.25 mg (04/17/2020), 2.25 mg (05/21/2020), 2.25 mg (05/28/2020), 2.25 mg (06/04/2020), 2.25 mg (06/19/2020), 2.25 mg (06/26/2020), 2.25 mg (07/03/2020), 2.25 mg (07/17/2020), 2.25 mg (07/24/2020), 2.25 mg (07/31/2020), 2.25 mg (08/14/2020)  for chemotherapy treatment.      CANCER STAGING: Cancer Staging No matching staging information was found for the patient.  INTERVAL HISTORY:  Ms. Brittany  Archer, a 79 y.o. female, returns for routine follow-up  and consideration for next cycle of chemotherapy. Brittany Archer was last seen on 07/17/2020.  Due for day #8 of cycle #32 of bortezomib today.   Today she is accompanied by her daughter. Overall, she tells me she has been feeling pretty well. She has not taken Revlimid for a week and 2 days since she misplaced the bottle, but when she is taking it, she is tolerating the treatments well and denies having numbness, tingling, diarrhea or nausea. Her appetite is good but she is still losing weight.  Overall, she feels ready for next cycle of chemo today.    REVIEW OF SYSTEMS:  Review of Systems  Constitutional: Positive for fatigue (mild) and unexpected weight change (11 lbs in 1 month). Negative for appetite change.  Gastrointestinal: Negative for diarrhea and nausea.  Neurological: Negative for numbness.  All other systems reviewed and are negative.   PAST MEDICAL/SURGICAL HISTORY:  Past Medical History:  Diagnosis Date  . Breast cancer ([place])    left breast/ 2008/ surg/ rad tx  . Coronary artery disease   . Diabetes mellitus    Past Surgical History:  Procedure Laterality Date  . ABDOMINAL HYSTERECTOMY    . BREAST SURGERY    . DEBRIDEMENT MANDIBLE N/A 02/22/2020   Procedure: INCISION AND DRAINAGE WITH DEBRIDEMENT MANDIBLE;  Surgeon: Michael Litter, DMD;  Location: [facility] ORS;  Service: Oral Surgery;  Laterality: N/A;  . TOOTH EXTRACTION N/A 02/22/2020   Procedure: DENTAL RESTORATION/EXTRACTIONS;  Surgeon: Michael Litter, DMD;  Location: [facility] ORS;  Service: Oral Surgery;  Laterality: N/A;  DENTAL KIT REQUESTED    SOCIAL HISTORY:  Social History   Socioeconomic History  . Marital status: Divorced    Spouse name: Not on file  . Number of children: Not on file  . Years of education: Not on file  . Highest education level: Not on file  Occupational History  . Not on file  Tobacco Use  . Smoking status: Never Smoker  . Smokeless tobacco: Never Used  Vaping Use  . Vaping Use: Never used   Substance and Sexual Activity  . Alcohol use: No  . Drug use: No  . Sexual activity: Yes    Birth control/protection: Surgical  Other Topics Concern  . Not on file  Social History Narrative  . Not on file   Social Determinants of Health   Financial Resource Strain:   . Difficulty of Paying Living Expenses: Not on file  Food Insecurity:   . Worried About Charity fundraiser in the Last Year: Not on file  . Ran Out of Food in the Last Year: Not on file  Transportation Needs:   . Lack of Transportation (Medical): Not on file  . Lack of Transportation (Non-Medical): Not on file  Physical Activity:   . Days of Exercise per Week: Not on file  . Minutes of Exercise per Session: Not on file  Stress:   . Feeling of Stress : Not on file  Social Connections:   . Frequency of Communication with Friends and Family: Not on file  . Frequency of Social Gatherings with Friends and Family: Not on file  . Attends Religious Services: Not on file  . Active Member of Clubs or Organizations: Not on file  . Attends Archivist Meetings: Not on file  . Marital Status: Not on file  Intimate Partner Violence:   . Fear of Current or Ex-Partner: Not on file  .  Emotionally Abused: Not on file  . Physically Abused: Not on file  . Sexually Abused: Not on file    FAMILY HISTORY:  Family History  Problem Relation Age of Onset  . Obesity Sister     CURRENT MEDICATIONS:  Current Outpatient Medications  Medication Sig Dispense Refill  . acyclovir (ZOVIRAX) 400 MG tablet TAKE 1 TABLET BY MOUTH TWICE DAILY 60 tablet 11  . aspirin 81 MG tablet Take 81 mg by mouth daily.      . bortezomib IV (VELCADE) 3.5 MG injection Inject 3.5 mg into the vein once a week. weekly     . chlorhexidine (PERIDEX) 0.12 % solution Use as directed 15 mLs in the mouth or throat 2 (two) times daily.     Marland Kitchen dexamethasone (DECADRON) 4 MG tablet Take 10 tablets (40 mg) on days 1, 8, and 15 of chemo. Repeat every 21 days.  30 tablet 3  . glipiZIDE (GLUCOTROL) 5 MG tablet Take 5 mg by mouth daily before breakfast.     . ketoconazole (NIZORAL) 2 % cream Apply topically.    Marland Kitchen lenalidomide (REVLIMID) 20 MG capsule Take 1 capsule by mouth once daily for 14 days on, and 7 days off of a 21 day cycle. 14 capsule 0  . lisinopril-hydrochlorothiazide (PRINZIDE,ZESTORETIC) 20-25 MG tablet Take 1 tablet by mouth every morning.     . magnesium oxide (MAG-OX) 400 (241.3 Mg) MG tablet Take 1 tablet (400 mg total) by mouth 2 (two) times daily. (Patient taking differently: Take 400 mg by mouth in the morning, at noon, and at bedtime. ) 60 tablet 0  . metFORMIN (GLUCOPHAGE) 1000 MG tablet Take 1,000 mg by mouth 2 times daily at 12 noon and 4 pm.      . potassium chloride SA (KLOR-CON) 20 MEQ tablet TAKE (2) TABLETS BY MOUTH THREE TIMES DAILY. 168 tablet 11   No current facility-administered medications for this visit.    ALLERGIES:  Allergies  Allergen Reactions  . Motrin [Ibuprofen] Rash    PHYSICAL EXAM:  Performance status (ECOG): 1 - Symptomatic but completely ambulatory  Vitals:   08/21/20 1258  BP: 127/64  Pulse: 88  Resp: 18  Temp: (!) 97.1 F (36.2 C)  SpO2: 96%   Wt Readings from Last 3 Encounters:  08/21/20 161 lb 6.4 oz (73.2 kg)  08/14/20 167 lb (75.8 kg)  07/17/20 172 lb 6.4 oz (78.2 kg)   Physical Exam Vitals reviewed.  Constitutional:      Appearance: Normal appearance.  Musculoskeletal:     Right lower leg: No edema.     Left lower leg: No edema.  Neurological:     General: No focal deficit present.     Mental Status: She is alert and oriented to person, place, and time.  Psychiatric:        Mood and Affect: Mood normal.        Behavior: Behavior normal.      LABORATORY DATA:  I have reviewed the labs as listed.  CBC Latest Ref Rng & Units 08/21/2020 08/14/2020 08/07/2020  WBC 4.0 - 10.5 K/uL 5.5 4.0 4.2  Hemoglobin 12.0 - 15.0 g/dL 10.2(L) 10.0(L) 9.6(L)  Hematocrit 36 - 46 % 30.9(L)  31.7(L) 29.7(L)  Platelets 150 - 400 K/uL 160 214 151   CMP Latest Ref Rng & Units 08/21/2020 08/14/2020 08/07/2020  Glucose 70 - 99 mg/dL 178(H) 89 99  BUN 8 - 23 mg/dL _0 Creatinine 0.44 - 1.00 mg/dL 1.06(H) 1.04(H)  1.00  Sodium 135 - 145 mmol/L 136 139 141  Potassium 3.5 - 5.1 mmol/L 3.4(L) 4.2 3.6  Chloride 98 - 111 mmol/L 103 109 108  CO2 22 - 32 mmol/L 21(L) 24 24  Calcium 8.9 - 10.3 mg/dL 9.4 9.6 9.6  Total Protein 6.5 - 8.1 g/dL 6.7 7.0 6.4(L)  Total Bilirubin 0.3 - 1.2 mg/dL 1.5(H) 0.8 0.9  Alkaline Phos 38 - 126 U/L 54 59 58  AST 15 - 41 U/L 12(L) 11(L) 12(L)  ALT 0 - 44 U/L _0 Lab Results  Component Value Date   LDH 148 08/14/2020   LDH 118 07/10/2020   LDH 115 06/04/2020   Lab Results  Component Value Date   TOTALPROTELP 6.3 08/14/2020   ALBUMINELP 3.6 08/14/2020   A1GS 0.2 08/14/2020   A2GS 0.8 08/14/2020   BETS 1.0 08/14/2020   GAMS 0.8 08/14/2020   MSPIKE 0.4 (H) 08/14/2020   SPEI Comment 08/14/2020   Lab Results  Component Value Date   KPAFRELGTCHN 24.8 (H) 08/14/2020   LAMBDASER 11.1 08/14/2020   KAPLAMBRATIO 2.23 (H) 08/14/2020    DIAGNOSTIC IMAGING:  I have independently reviewed the scans and discussed with the patient. No results found.   ASSESSMENT:  1. IgA kappa plasma cell myeloma, stage I: -RVD started on 01/09/2018, held since 02/14/2020 due to mandible abscess. -Myeloma labs on 05/14/2020 showed progression with M spike of 0.5 g. -RVD started back on 05/21/2020. -Myeloma labs on 07/10/2020 shows M spike improved to 0.3 g from 0.6 g previously.  Free light chain ratio is 1.74 with kappa light chains 28.4. -Myeloma panel from 08/14/2020 shows M spike 0.4 g.  Kappa light chains are 24.8 and ratio is 2.23.  2. Osteomyelitis of the right mandible/dental abscess: -Finished IV ceftriaxone on 04/03/2020. Finished oral antibiotics. -We will hold Xgeva indefinitely.   PLAN:  1. IgA kappa plasma cell myeloma, stage I: -She is  tolerating Revlimid and Velcade 3 weeks on 1 week off very well. -Her numbers went up slightly with M spike of 0.4 g and ratio 2.23. -She apparently misplaced her Revlimid bottle and has not taken it in the last 10 to 14 days. -We will request her pharmacy to deliver 1 more bottle.  She will start it as soon as she receives. -She will proceed with her next cycle of Velcade today. -I plan to see her back in 4 weeks with repeat myeloma labs.  2. Severe hypokalemia: -Potassium is 3.4.  Continue potassium 40 mEq 3 times a day. -She is occasionally noncompliant.  3. Osteomyelitis of the right mandible/dental abscess: -Bisphosphonate on hold indefinitely.  4. Hypomagnesemia: -Magnesium today is 1.5.  She is taking twice daily. -We will increase it to 3 times a day.  5. 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 -Continue Retacrit as needed.  Hemoglobin today is 10.2.   Orders placed this encounter:  Orders Placed This Encounter  Procedures  . Magnesium     Derek Jack, MD [place] [phone]   I, Milinda Antis, am acting as a scribe for Dr. Sanda Linger.  I, Derek Jack MD, have reviewed the above documentation for accuracy and completeness, and I agree with the above.

## 2020-08-28 ENCOUNTER — Inpatient Hospital Stay (HOSPITAL_COMMUNITY): Payer: Medicare Other | Attending: Hematology

## 2020-08-28 ENCOUNTER — Encounter (HOSPITAL_COMMUNITY): Payer: Self-pay

## 2020-08-28 ENCOUNTER — Other Ambulatory Visit (HOSPITAL_COMMUNITY): Payer: Self-pay | Admitting: *Deleted

## 2020-08-28 ENCOUNTER — Inpatient Hospital Stay (HOSPITAL_COMMUNITY): Payer: Medicare Other

## 2020-08-28 ENCOUNTER — Other Ambulatory Visit: Payer: Self-pay

## 2020-08-28 VITALS — BP 135/61 | HR 81 | Temp 97.0°F | Resp 18 | Wt 166.7 lb

## 2020-08-28 DIAGNOSIS — E538 Deficiency of other specified B group vitamins: Secondary | ICD-10-CM | POA: Insufficient documentation

## 2020-08-28 DIAGNOSIS — C9000 Multiple myeloma not having achieved remission: Secondary | ICD-10-CM

## 2020-08-28 DIAGNOSIS — D631 Anemia in chronic kidney disease: Secondary | ICD-10-CM | POA: Insufficient documentation

## 2020-08-28 DIAGNOSIS — Z5112 Encounter for antineoplastic immunotherapy: Secondary | ICD-10-CM | POA: Diagnosis not present

## 2020-08-28 DIAGNOSIS — N189 Chronic kidney disease, unspecified: Secondary | ICD-10-CM | POA: Insufficient documentation

## 2020-08-28 LAB — CBC WITH DIFFERENTIAL/PLATELET
Abs Immature Granulocytes: 0.02 10*3/uL (ref 0.00–0.07)
Basophils Absolute: 0 10*3/uL (ref 0.0–0.1)
Basophils Relative: 0 %
Eosinophils Absolute: 0.1 10*3/uL (ref 0.0–0.5)
Eosinophils Relative: 2 %
HCT: 30.4 % — ABNORMAL LOW (ref 36.0–46.0)
Hemoglobin: 9.7 g/dL — ABNORMAL LOW (ref 12.0–15.0)
Immature Granulocytes: 0 %
Lymphocytes Relative: 24 %
Lymphs Abs: 1.1 10*3/uL (ref 0.7–4.0)
MCH: 32.7 pg (ref 26.0–34.0)
MCHC: 31.9 g/dL (ref 30.0–36.0)
MCV: 102.4 fL — ABNORMAL HIGH (ref 80.0–100.0)
Monocytes Absolute: 0.3 10*3/uL (ref 0.1–1.0)
Monocytes Relative: 7 %
Neutro Abs: 3 10*3/uL (ref 1.7–7.7)
Neutrophils Relative %: 67 %
Platelets: 148 10*3/uL — ABNORMAL LOW (ref 150–400)
RBC: 2.97 MIL/uL — ABNORMAL LOW (ref 3.87–5.11)
RDW: 16.2 % — ABNORMAL HIGH (ref 11.5–15.5)
WBC: 4.5 10*3/uL (ref 4.0–10.5)
nRBC: 0 % (ref 0.0–0.2)

## 2020-08-28 LAB — MAGNESIUM: Magnesium: 1.6 mg/dL — ABNORMAL LOW (ref 1.7–2.4)

## 2020-08-28 LAB — COMPREHENSIVE METABOLIC PANEL
ALT: 9 U/L (ref 0–44)
AST: 11 U/L — ABNORMAL LOW (ref 15–41)
Albumin: 3.6 g/dL (ref 3.5–5.0)
Alkaline Phosphatase: 53 U/L (ref 38–126)
Anion gap: 13 (ref 5–15)
BUN: 25 mg/dL — ABNORMAL HIGH (ref 8–23)
CO2: 25 mmol/L (ref 22–32)
Calcium: 10.1 mg/dL (ref 8.9–10.3)
Chloride: 101 mmol/L (ref 98–111)
Creatinine, Ser: 1.11 mg/dL — ABNORMAL HIGH (ref 0.44–1.00)
GFR calc Af Amer: 54 mL/min — ABNORMAL LOW (ref >60)
GFR calc non Af Amer: 47 mL/min — ABNORMAL LOW (ref >60)
Glucose, Bld: 128 mg/dL — ABNORMAL HIGH (ref 70–99)
Potassium: 3.8 mmol/L (ref 3.5–5.1)
Sodium: 139 mmol/L (ref 135–145)
Total Bilirubin: 0.8 mg/dL (ref 0.3–1.2)
Total Protein: 7.2 g/dL (ref 6.5–8.1)

## 2020-08-28 LAB — LACTATE DEHYDROGENASE: LDH: 136 U/L (ref 98–192)

## 2020-08-28 MED ORDER — PROCHLORPERAZINE MALEATE 10 MG PO TABS
10.0000 mg | ORAL_TABLET | Freq: Once | ORAL | Status: AC
  Administered 2020-08-28: 10 mg via ORAL
  Filled 2020-08-28: qty 1

## 2020-08-28 MED ORDER — DEXAMETHASONE 4 MG PO TABS
40.0000 mg | ORAL_TABLET | Freq: Once | ORAL | Status: AC
  Administered 2020-08-28: 40 mg via ORAL
  Filled 2020-08-28: qty 10

## 2020-08-28 MED ORDER — BORTEZOMIB CHEMO SQ INJECTION 3.5 MG (2.5MG/ML)
1.3000 mg/m2 | Freq: Once | INTRAMUSCULAR | Status: AC
  Administered 2020-08-28: 2.25 mg via SUBCUTANEOUS
  Filled 2020-08-28: qty 0.9

## 2020-08-28 MED ORDER — EPOETIN ALFA-EPBX 10000 UNIT/ML IJ SOLN
20000.0000 [IU] | Freq: Once | INTRAMUSCULAR | Status: AC
  Administered 2020-08-28: 20000 [IU] via SUBCUTANEOUS
  Filled 2020-08-28: qty 2

## 2020-08-28 NOTE — Progress Notes (Signed)
Patient tolerated Velcade and Retacrit injection with no complaints voiced.  Lab work reviewed.  See MAR for details.  Injection site clean and dry with no bruising or swelling noted.  Band aids applied.  VSS.  Patient left in satisfactory condition with no s/s of distress noted.

## 2020-08-29 LAB — PROTEIN ELECTROPHORESIS, SERUM
A/G Ratio: 1.1 (ref 0.7–1.7)
Albumin ELP: 3.3 g/dL (ref 2.9–4.4)
Alpha-1-Globulin: 0.2 g/dL (ref 0.0–0.4)
Alpha-2-Globulin: 1 g/dL (ref 0.4–1.0)
Beta Globulin: 1.1 g/dL (ref 0.7–1.3)
Gamma Globulin: 0.7 g/dL (ref 0.4–1.8)
Globulin, Total: 2.9 g/dL (ref 2.2–3.9)
M-Spike, %: 0.4 g/dL — ABNORMAL HIGH
Total Protein ELP: 6.2 g/dL (ref 6.0–8.5)

## 2020-08-29 LAB — KAPPA/LAMBDA LIGHT CHAINS
Kappa free light chain: 32 mg/L — ABNORMAL HIGH (ref 3.3–19.4)
Kappa, lambda light chain ratio: 1.93 — ABNORMAL HIGH (ref 0.26–1.65)
Lambda free light chains: 16.6 mg/L (ref 5.7–26.3)

## 2020-08-30 LAB — IMMUNOFIXATION ELECTROPHORESIS
IgA: 252 mg/dL (ref 64–422)
IgG (Immunoglobin G), Serum: 716 mg/dL (ref 586–1602)
IgM (Immunoglobulin M), Srm: 96 mg/dL (ref 26–217)
Total Protein ELP: 6.3 g/dL (ref 6.0–8.5)

## 2020-09-04 ENCOUNTER — Inpatient Hospital Stay (HOSPITAL_COMMUNITY): Payer: Medicare Other

## 2020-09-04 ENCOUNTER — Encounter (HOSPITAL_COMMUNITY): Payer: Self-pay

## 2020-09-04 ENCOUNTER — Other Ambulatory Visit: Payer: Self-pay

## 2020-09-04 VITALS — BP 141/68 | HR 72 | Temp 98.4°F | Resp 18

## 2020-09-04 DIAGNOSIS — Z5112 Encounter for antineoplastic immunotherapy: Secondary | ICD-10-CM | POA: Diagnosis not present

## 2020-09-04 DIAGNOSIS — E538 Deficiency of other specified B group vitamins: Secondary | ICD-10-CM

## 2020-09-04 DIAGNOSIS — C9000 Multiple myeloma not having achieved remission: Secondary | ICD-10-CM

## 2020-09-04 LAB — CBC WITH DIFFERENTIAL/PLATELET
Abs Immature Granulocytes: 0.04 10*3/uL (ref 0.00–0.07)
Basophils Absolute: 0 10*3/uL (ref 0.0–0.1)
Basophils Relative: 0 %
Eosinophils Absolute: 0.1 10*3/uL (ref 0.0–0.5)
Eosinophils Relative: 2 %
HCT: 29.9 % — ABNORMAL LOW (ref 36.0–46.0)
Hemoglobin: 9.7 g/dL — ABNORMAL LOW (ref 12.0–15.0)
Immature Granulocytes: 1 %
Lymphocytes Relative: 20 %
Lymphs Abs: 1.2 10*3/uL (ref 0.7–4.0)
MCH: 33.7 pg (ref 26.0–34.0)
MCHC: 32.4 g/dL (ref 30.0–36.0)
MCV: 103.8 fL — ABNORMAL HIGH (ref 80.0–100.0)
Monocytes Absolute: 0.6 10*3/uL (ref 0.1–1.0)
Monocytes Relative: 9 %
Neutro Abs: 4.3 10*3/uL (ref 1.7–7.7)
Neutrophils Relative %: 68 %
Platelets: 159 10*3/uL (ref 150–400)
RBC: 2.88 MIL/uL — ABNORMAL LOW (ref 3.87–5.11)
RDW: 16.5 % — ABNORMAL HIGH (ref 11.5–15.5)
WBC: 6.2 10*3/uL (ref 4.0–10.5)
nRBC: 1 % — ABNORMAL HIGH (ref 0.0–0.2)

## 2020-09-04 LAB — COMPREHENSIVE METABOLIC PANEL
ALT: 10 U/L (ref 0–44)
AST: 11 U/L — ABNORMAL LOW (ref 15–41)
Albumin: 3.7 g/dL (ref 3.5–5.0)
Alkaline Phosphatase: 58 U/L (ref 38–126)
Anion gap: 7 (ref 5–15)
BUN: 20 mg/dL (ref 8–23)
CO2: 26 mmol/L (ref 22–32)
Calcium: 10.2 mg/dL (ref 8.9–10.3)
Chloride: 106 mmol/L (ref 98–111)
Creatinine, Ser: 1.05 mg/dL — ABNORMAL HIGH (ref 0.44–1.00)
GFR calc Af Amer: 58 mL/min — ABNORMAL LOW (ref >60)
GFR calc non Af Amer: 50 mL/min — ABNORMAL LOW (ref >60)
Glucose, Bld: 86 mg/dL (ref 70–99)
Potassium: 3.9 mmol/L (ref 3.5–5.1)
Sodium: 139 mmol/L (ref 135–145)
Total Bilirubin: 1.1 mg/dL (ref 0.3–1.2)
Total Protein: 6.8 g/dL (ref 6.5–8.1)

## 2020-09-04 LAB — MAGNESIUM: Magnesium: 1.6 mg/dL — ABNORMAL LOW (ref 1.7–2.4)

## 2020-09-04 MED ORDER — CYANOCOBALAMIN 1000 MCG/ML IJ SOLN
1000.0000 ug | Freq: Once | INTRAMUSCULAR | Status: AC
  Administered 2020-09-04: 1000 ug via INTRAMUSCULAR
  Filled 2020-09-04: qty 1

## 2020-09-04 MED ORDER — EPOETIN ALFA-EPBX 10000 UNIT/ML IJ SOLN
20000.0000 [IU] | Freq: Once | INTRAMUSCULAR | Status: AC
  Administered 2020-09-04: 20000 [IU] via SUBCUTANEOUS
  Filled 2020-09-04: qty 2

## 2020-09-04 NOTE — Progress Notes (Signed)
[place] reviewed with RLockamy NP and pt approved to receive Retacrit and Vit B12 injections today and no magnesium infusion needed per NP. Pt to continue to take her Mag Ox TID as prescribed and will return next week for labs and Velcade injection.                                  Brittany Archer tolerated Retacrit and Vit B12 injections well without complaints or incident. VSS Hgb 9.7 today. Pt discharged self ambulatory in satisfactory condition accompanied by family member

## 2020-09-04 NOTE — Patient Instructions (Signed)
[place] at [facility] Discharge Instructions  Received Vit B12 and Retacrit injections today. Follow-up as scheduled   Thank you for choosing [place] at [facility] to provide your oncology and hematology care.  To afford each patient quality time with our provider, please arrive at least 15 minutes before your scheduled appointment time.   If you have a lab appointment with the [place] please come in thru the Main Entrance and check in at the main information desk.  You need to re-schedule your appointment should you arrive 10 or more minutes late.  We strive to give you quality time with our providers, and arriving late affects you and other patients whose appointments are after yours.  Also, if you no show three or more times for appointments you may be dismissed from the clinic at the providers discretion.     Again, thank you for choosing [facility].  Our hope is that these requests will decrease the amount of time that you wait before being seen by our physicians.       _____________________________________________________________  Should you have questions after your visit to [facility], please contact our office at [phone] and follow the prompts.  Our office hours are 8:00 a.m. and 4:30 p.m. Monday - Friday.  Please note that voicemails left after 4:00 p.m. may not be returned until the following business day.  We are closed weekends and major holidays.  You do have access to a nurse 24-7, just call the main number to the clinic [phone] and do not press any options, hold on the line and a nurse will answer the phone.    For prescription refill requests, have your pharmacy contact our office and allow 72 hours.    Due to Covid, you will need to wear a mask upon entering the hospital. If you do not have a mask, a mask will be given to you at the Main Entrance upon arrival. For doctor visits,  patients may have 1 support person age 44 or older with them. For treatment visits, patients can not have anyone with them due to social distancing guidelines and our immunocompromised population.

## 2020-09-05 ENCOUNTER — Other Ambulatory Visit (HOSPITAL_COMMUNITY): Payer: Self-pay

## 2020-09-05 DIAGNOSIS — C9000 Multiple myeloma not having achieved remission: Secondary | ICD-10-CM

## 2020-09-05 MED ORDER — LENALIDOMIDE 20 MG PO CAPS: ORAL_CAPSULE | ORAL | 0 refills | Status: DC

## 2020-09-05 NOTE — Telephone Encounter (Signed)
Chart reviewed. Revlimid refilled per Dr. Delton Coombes.

## 2020-09-11 ENCOUNTER — Inpatient Hospital Stay (HOSPITAL_COMMUNITY): Payer: Medicare Other

## 2020-09-11 ENCOUNTER — Encounter (HOSPITAL_COMMUNITY): Payer: Self-pay

## 2020-09-11 ENCOUNTER — Other Ambulatory Visit: Payer: Self-pay

## 2020-09-11 VITALS — BP 149/68 | HR 78 | Temp 97.1°F | Resp 17

## 2020-09-11 DIAGNOSIS — Z5112 Encounter for antineoplastic immunotherapy: Secondary | ICD-10-CM | POA: Diagnosis not present

## 2020-09-11 DIAGNOSIS — C9000 Multiple myeloma not having achieved remission: Secondary | ICD-10-CM

## 2020-09-11 DIAGNOSIS — E538 Deficiency of other specified B group vitamins: Secondary | ICD-10-CM

## 2020-09-11 LAB — CBC WITH DIFFERENTIAL/PLATELET
Abs Immature Granulocytes: 0.02 10*3/uL (ref 0.00–0.07)
Basophils Absolute: 0 10*3/uL (ref 0.0–0.1)
Basophils Relative: 0 %
Eosinophils Absolute: 0.1 10*3/uL (ref 0.0–0.5)
Eosinophils Relative: 2 %
HCT: 30.6 % — ABNORMAL LOW (ref 36.0–46.0)
Hemoglobin: 9.7 g/dL — ABNORMAL LOW (ref 12.0–15.0)
Immature Granulocytes: 0 %
Lymphocytes Relative: 14 %
Lymphs Abs: 0.7 10*3/uL (ref 0.7–4.0)
MCH: 33.9 pg (ref 26.0–34.0)
MCHC: 31.7 g/dL (ref 30.0–36.0)
MCV: 107 fL — ABNORMAL HIGH (ref 80.0–100.0)
Monocytes Absolute: 0.4 10*3/uL (ref 0.1–1.0)
Monocytes Relative: 8 %
Neutro Abs: 3.7 10*3/uL (ref 1.7–7.7)
Neutrophils Relative %: 76 %
Platelets: 201 10*3/uL (ref 150–400)
RBC: 2.86 MIL/uL — ABNORMAL LOW (ref 3.87–5.11)
RDW: 17.7 % — ABNORMAL HIGH (ref 11.5–15.5)
WBC: 5 10*3/uL (ref 4.0–10.5)
nRBC: 0 % (ref 0.0–0.2)

## 2020-09-11 LAB — COMPREHENSIVE METABOLIC PANEL
ALT: 8 U/L (ref 0–44)
AST: 10 U/L — ABNORMAL LOW (ref 15–41)
Albumin: 3.6 g/dL (ref 3.5–5.0)
Alkaline Phosphatase: 50 U/L (ref 38–126)
Anion gap: 9 (ref 5–15)
BUN: 19 mg/dL (ref 8–23)
CO2: 27 mmol/L (ref 22–32)
Calcium: 9.3 mg/dL (ref 8.9–10.3)
Chloride: 104 mmol/L (ref 98–111)
Creatinine, Ser: 0.93 mg/dL (ref 0.44–1.00)
GFR calc Af Amer: >60 mL/min (ref >60)
GFR calc non Af Amer: 58 mL/min — ABNORMAL LOW (ref >60)
Glucose, Bld: 144 mg/dL — ABNORMAL HIGH (ref 70–99)
Potassium: 3.3 mmol/L — ABNORMAL LOW (ref 3.5–5.1)
Sodium: 140 mmol/L (ref 135–145)
Total Bilirubin: 0.9 mg/dL (ref 0.3–1.2)
Total Protein: 6.7 g/dL (ref 6.5–8.1)

## 2020-09-11 LAB — MAGNESIUM: Magnesium: 1.6 mg/dL — ABNORMAL LOW (ref 1.7–2.4)

## 2020-09-11 MED ORDER — EPOETIN ALFA-EPBX 10000 UNIT/ML IJ SOLN
20000.0000 [IU] | Freq: Once | INTRAMUSCULAR | Status: AC
  Administered 2020-09-11: 20000 [IU] via SUBCUTANEOUS
  Filled 2020-09-11: qty 2

## 2020-09-11 MED ORDER — PROCHLORPERAZINE MALEATE 10 MG PO TABS
10.0000 mg | ORAL_TABLET | Freq: Once | ORAL | Status: AC
  Administered 2020-09-11: 10 mg via ORAL
  Filled 2020-09-11: qty 1

## 2020-09-11 MED ORDER — BORTEZOMIB CHEMO SQ INJECTION 3.5 MG (2.5MG/ML)
1.3000 mg/m2 | Freq: Once | INTRAMUSCULAR | Status: AC
  Administered 2020-09-11: 2.25 mg via SUBCUTANEOUS
  Filled 2020-09-11: qty 0.9

## 2020-09-11 MED ORDER — DEXAMETHASONE 4 MG PO TABS
40.0000 mg | ORAL_TABLET | Freq: Once | ORAL | Status: AC
  Administered 2020-09-11: 40 mg via ORAL
  Filled 2020-09-11: qty 10

## 2020-09-11 NOTE — Progress Notes (Signed)
Patient presents today for Retacrit injection and Velcade injection. Labs reviewed prior to administration. VSS. Injections tolerated without incident or complaint. See MAR for details. Patient discharged in satisfactory condition with follow up instructions.

## 2020-09-13 ENCOUNTER — Institutional Professional Consult (permissible substitution): Payer: Medicare Other | Admitting: Plastic Surgery

## 2020-09-18 ENCOUNTER — Inpatient Hospital Stay (HOSPITAL_COMMUNITY): Payer: Medicare Other | Admitting: Hematology

## 2020-09-18 ENCOUNTER — Encounter (HOSPITAL_COMMUNITY): Payer: Self-pay

## 2020-09-18 ENCOUNTER — Other Ambulatory Visit: Payer: Self-pay

## 2020-09-18 ENCOUNTER — Inpatient Hospital Stay (HOSPITAL_COMMUNITY): Payer: Medicare Other

## 2020-09-18 VITALS — BP 130/61 | HR 81 | Temp 97.2°F | Resp 18 | Wt 169.8 lb

## 2020-09-18 DIAGNOSIS — C9000 Multiple myeloma not having achieved remission: Secondary | ICD-10-CM

## 2020-09-18 DIAGNOSIS — Z5112 Encounter for antineoplastic immunotherapy: Secondary | ICD-10-CM | POA: Diagnosis not present

## 2020-09-18 LAB — CBC WITH DIFFERENTIAL/PLATELET
Abs Immature Granulocytes: 0.02 10*3/uL (ref 0.00–0.07)
Basophils Absolute: 0 10*3/uL (ref 0.0–0.1)
Basophils Relative: 0 %
Eosinophils Absolute: 0.2 10*3/uL (ref 0.0–0.5)
Eosinophils Relative: 5 %
HCT: 32.4 % — ABNORMAL LOW (ref 36.0–46.0)
Hemoglobin: 10.3 g/dL — ABNORMAL LOW (ref 12.0–15.0)
Immature Granulocytes: 1 %
Lymphocytes Relative: 21 %
Lymphs Abs: 0.8 10*3/uL (ref 0.7–4.0)
MCH: 33.9 pg (ref 26.0–34.0)
MCHC: 31.8 g/dL (ref 30.0–36.0)
MCV: 106.6 fL — ABNORMAL HIGH (ref 80.0–100.0)
Monocytes Absolute: 0.3 10*3/uL (ref 0.1–1.0)
Monocytes Relative: 8 %
Neutro Abs: 2.5 10*3/uL (ref 1.7–7.7)
Neutrophils Relative %: 65 %
Platelets: 205 10*3/uL (ref 150–400)
RBC: 3.04 MIL/uL — ABNORMAL LOW (ref 3.87–5.11)
RDW: 17.2 % — ABNORMAL HIGH (ref 11.5–15.5)
WBC: 3.8 10*3/uL — ABNORMAL LOW (ref 4.0–10.5)
nRBC: 0 % (ref 0.0–0.2)

## 2020-09-18 LAB — COMPREHENSIVE METABOLIC PANEL
ALT: 7 U/L (ref 0–44)
AST: 10 U/L — ABNORMAL LOW (ref 15–41)
Albumin: 3.6 g/dL (ref 3.5–5.0)
Alkaline Phosphatase: 61 U/L (ref 38–126)
Anion gap: 9 (ref 5–15)
BUN: 19 mg/dL (ref 8–23)
CO2: 24 mmol/L (ref 22–32)
Calcium: 9.7 mg/dL (ref 8.9–10.3)
Chloride: 102 mmol/L (ref 98–111)
Creatinine, Ser: 0.99 mg/dL (ref 0.44–1.00)
GFR calc Af Amer: >60 mL/min (ref >60)
GFR calc non Af Amer: 54 mL/min — ABNORMAL LOW (ref >60)
Glucose, Bld: 155 mg/dL — ABNORMAL HIGH (ref 70–99)
Potassium: 3.4 mmol/L — ABNORMAL LOW (ref 3.5–5.1)
Sodium: 135 mmol/L (ref 135–145)
Total Bilirubin: 0.6 mg/dL (ref 0.3–1.2)
Total Protein: 6.7 g/dL (ref 6.5–8.1)

## 2020-09-18 LAB — LACTATE DEHYDROGENASE: LDH: 107 U/L (ref 98–192)

## 2020-09-18 LAB — MAGNESIUM: Magnesium: 1.6 mg/dL — ABNORMAL LOW (ref 1.7–2.4)

## 2020-09-18 MED ORDER — DEXAMETHASONE 4 MG PO TABS
40.0000 mg | ORAL_TABLET | Freq: Once | ORAL | Status: AC
  Administered 2020-09-18: 40 mg via ORAL
  Filled 2020-09-18: qty 10

## 2020-09-18 MED ORDER — PROCHLORPERAZINE MALEATE 10 MG PO TABS
10.0000 mg | ORAL_TABLET | Freq: Once | ORAL | Status: AC
  Administered 2020-09-18: 10 mg via ORAL
  Filled 2020-09-18: qty 1

## 2020-09-18 MED ORDER — BORTEZOMIB CHEMO SQ INJECTION 3.5 MG (2.5MG/ML)
1.3000 mg/m2 | Freq: Once | INTRAMUSCULAR | Status: AC
  Administered 2020-09-18: 2.25 mg via SUBCUTANEOUS
  Filled 2020-09-18: qty 0.9

## 2020-09-18 NOTE — Patient Instructions (Signed)
[facility] Discharge Instructions for Patients Receiving Chemotherapy   Beginning January 23rd 2017 lab work for the Cancer Center will be done in the  Main lab at [facility] on 1st floor. If you have a lab appointment with the Cancer Center please come in thru the  Main Entrance and check in at the main information desk   Today you received the following chemotherapy agents Velcade injection. Follow-up as scheduled  To help prevent nausea and vomiting after your treatment, we encourage you to take your nausea medication   If you develop nausea and vomiting, or diarrhea that is not controlled by your medication, call the clinic.  The clinic phone number is [phone]. Office hours are Monday-Friday 8:30am-5:00pm.  BELOW ARE SYMPTOMS THAT SHOULD BE REPORTED IMMEDIATELY:  *FEVER GREATER THAN 101.0 F  *CHILLS WITH OR WITHOUT FEVER  NAUSEA AND VOMITING THAT IS NOT CONTROLLED WITH YOUR NAUSEA MEDICATION  *UNUSUAL SHORTNESS OF BREATH  *UNUSUAL BRUISING OR BLEEDING  TENDERNESS IN MOUTH AND THROAT WITH OR WITHOUT PRESENCE OF ULCERS  *URINARY PROBLEMS  *BOWEL PROBLEMS  UNUSUAL RASH Items with * indicate a potential emergency and should be followed up as soon as possible. If you have an emergency after office hours please contact your primary care physician or go to the nearest emergency department.  Please call the clinic during office hours if you have any questions or concerns.   You may also contact the Patient Navigator at [phone] should you have any questions or need assistance in obtaining follow up care.      Resources For Cancer Patients and their Caregivers ? American Cancer Society: Can assist with transportation, wigs, general needs, runs Look Good Feel Better.        1-888-227-6333 ? Cancer Care: Provides financial assistance, online support groups, medication/co-pay assistance.  1-800-813-HOPE (4673) ? [facility] Assists [place] cancer patients and their families through emotional , educational and financial support.  336-427-4357 ? [place] DSS Where to apply for food stamps, Medicaid and utility assistance. 336-342-1394 ? [facility]: Transportation to medical appointments. 336-347-2287 ? Social Security Administration: May apply for disability if have a Stage IV cancer. 336-342-7796 1-800-772-1213 ? [place] [facility], Disability and Transit Services: Assists with nutrition, care and transit needs. 336-349-2343         

## 2020-09-18 NOTE — Progress Notes (Signed)
Brittany Archer tolerated Velcade injection well without complaints or incident. Labs reviewed prior to administering this medication. Hgb 10.3 today and Retacrit injection held per parameters. VSS Pt discharged self ambulatory in satisfactory condition accompanied by family member

## 2020-09-19 LAB — PROTEIN ELECTROPHORESIS, SERUM
A/G Ratio: 1.2 (ref 0.7–1.7)
Albumin ELP: 3.4 g/dL (ref 2.9–4.4)
Alpha-1-Globulin: 0.2 g/dL (ref 0.0–0.4)
Alpha-2-Globulin: 0.8 g/dL (ref 0.4–1.0)
Beta Globulin: 1 g/dL (ref 0.7–1.3)
Gamma Globulin: 0.9 g/dL (ref 0.4–1.8)
Globulin, Total: 2.9 g/dL (ref 2.2–3.9)
M-Spike, %: 0.5 g/dL — ABNORMAL HIGH
Total Protein ELP: 6.3 g/dL (ref 6.0–8.5)

## 2020-09-19 LAB — KAPPA/LAMBDA LIGHT CHAINS
Kappa free light chain: 39.1 mg/L — ABNORMAL HIGH (ref 3.3–19.4)
Kappa, lambda light chain ratio: 1.93 — ABNORMAL HIGH (ref 0.26–1.65)
Lambda free light chains: 20.3 mg/L (ref 5.7–26.3)

## 2020-09-20 LAB — IMMUNOFIXATION ELECTROPHORESIS
IgA: 285 mg/dL (ref 64–422)
IgG (Immunoglobin G), Serum: 866 mg/dL (ref 586–1602)
IgM (Immunoglobulin M), Srm: 112 mg/dL (ref 26–217)
Total Protein ELP: 6.2 g/dL (ref 6.0–8.5)

## 2020-09-25 ENCOUNTER — Ambulatory Visit (HOSPITAL_COMMUNITY): Payer: Medicare Other

## 2020-09-25 ENCOUNTER — Ambulatory Visit (HOSPITAL_COMMUNITY): Payer: Medicare Other | Admitting: Hematology

## 2020-09-25 ENCOUNTER — Inpatient Hospital Stay (HOSPITAL_COMMUNITY): Payer: Medicare Other

## 2020-09-30 ENCOUNTER — Inpatient Hospital Stay (HOSPITAL_COMMUNITY): Payer: Medicare Other | Attending: Hematology

## 2020-09-30 ENCOUNTER — Other Ambulatory Visit: Payer: Self-pay

## 2020-09-30 ENCOUNTER — Inpatient Hospital Stay (HOSPITAL_COMMUNITY): Payer: Medicare Other

## 2020-09-30 ENCOUNTER — Encounter (HOSPITAL_COMMUNITY): Payer: Self-pay | Admitting: Oncology

## 2020-09-30 ENCOUNTER — Inpatient Hospital Stay (HOSPITAL_BASED_OUTPATIENT_CLINIC_OR_DEPARTMENT_OTHER): Payer: Medicare Other | Admitting: Oncology

## 2020-09-30 VITALS — BP 128/56 | HR 64 | Temp 96.8°F | Resp 17 | Wt 176.2 lb

## 2020-09-30 DIAGNOSIS — C9000 Multiple myeloma not having achieved remission: Secondary | ICD-10-CM | POA: Insufficient documentation

## 2020-09-30 DIAGNOSIS — E538 Deficiency of other specified B group vitamins: Secondary | ICD-10-CM | POA: Insufficient documentation

## 2020-09-30 DIAGNOSIS — Z5112 Encounter for antineoplastic immunotherapy: Secondary | ICD-10-CM | POA: Diagnosis not present

## 2020-09-30 LAB — COMPREHENSIVE METABOLIC PANEL
ALT: 9 U/L (ref 0–44)
AST: 10 U/L — ABNORMAL LOW (ref 15–41)
Albumin: 3.6 g/dL (ref 3.5–5.0)
Alkaline Phosphatase: 61 U/L (ref 38–126)
Anion gap: 10 (ref 5–15)
BUN: 19 mg/dL (ref 8–23)
CO2: 20 mmol/L — ABNORMAL LOW (ref 22–32)
Calcium: 9.5 mg/dL (ref 8.9–10.3)
Chloride: 106 mmol/L (ref 98–111)
Creatinine, Ser: 1 mg/dL (ref 0.44–1.00)
GFR calc Af Amer: >60 mL/min (ref >60)
GFR calc non Af Amer: 53 mL/min — ABNORMAL LOW (ref >60)
Glucose, Bld: 90 mg/dL (ref 70–99)
Potassium: 3.8 mmol/L (ref 3.5–5.1)
Sodium: 136 mmol/L (ref 135–145)
Total Bilirubin: 0.6 mg/dL (ref 0.3–1.2)
Total Protein: 6.9 g/dL (ref 6.5–8.1)

## 2020-09-30 LAB — CBC WITH DIFFERENTIAL/PLATELET
Abs Immature Granulocytes: 0.01 10*3/uL (ref 0.00–0.07)
Basophils Absolute: 0 10*3/uL (ref 0.0–0.1)
Basophils Relative: 1 %
Eosinophils Absolute: 0.2 10*3/uL (ref 0.0–0.5)
Eosinophils Relative: 5 %
HCT: 31.8 % — ABNORMAL LOW (ref 36.0–46.0)
Hemoglobin: 10.4 g/dL — ABNORMAL LOW (ref 12.0–15.0)
Immature Granulocytes: 0 %
Lymphocytes Relative: 21 %
Lymphs Abs: 0.8 10*3/uL (ref 0.7–4.0)
MCH: 34 pg (ref 26.0–34.0)
MCHC: 32.7 g/dL (ref 30.0–36.0)
MCV: 103.9 fL — ABNORMAL HIGH (ref 80.0–100.0)
Monocytes Absolute: 0.5 10*3/uL (ref 0.1–1.0)
Monocytes Relative: 12 %
Neutro Abs: 2.3 10*3/uL (ref 1.7–7.7)
Neutrophils Relative %: 61 %
Platelets: 216 10*3/uL (ref 150–400)
RBC: 3.06 MIL/uL — ABNORMAL LOW (ref 3.87–5.11)
RDW: 15.9 % — ABNORMAL HIGH (ref 11.5–15.5)
WBC: 3.8 10*3/uL — ABNORMAL LOW (ref 4.0–10.5)
nRBC: 0 % (ref 0.0–0.2)

## 2020-09-30 LAB — MAGNESIUM: Magnesium: 1.7 mg/dL (ref 1.7–2.4)

## 2020-09-30 MED ORDER — DEXAMETHASONE 4 MG PO TABS
40.0000 mg | ORAL_TABLET | Freq: Once | ORAL | Status: AC
  Administered 2020-09-30: 40 mg via ORAL
  Filled 2020-09-30: qty 10

## 2020-09-30 MED ORDER — PROCHLORPERAZINE MALEATE 10 MG PO TABS
10.0000 mg | ORAL_TABLET | Freq: Once | ORAL | Status: AC
  Administered 2020-09-30: 10 mg via ORAL
  Filled 2020-09-30: qty 1

## 2020-09-30 MED ORDER — CYANOCOBALAMIN 1000 MCG/ML IJ SOLN
INTRAMUSCULAR | Status: AC
  Filled 2020-09-30: qty 1

## 2020-09-30 MED ORDER — BORTEZOMIB CHEMO SQ INJECTION 3.5 MG (2.5MG/ML)
1.3000 mg/m2 | Freq: Once | INTRAMUSCULAR | Status: AC
  Administered 2020-09-30: 2.25 mg via SUBCUTANEOUS
  Filled 2020-09-30: qty 0.9

## 2020-09-30 MED ORDER — CYANOCOBALAMIN 1000 MCG/ML IJ SOLN
1000.0000 ug | Freq: Once | INTRAMUSCULAR | Status: AC
  Administered 2020-09-30: 1000 ug via INTRAMUSCULAR

## 2020-09-30 NOTE — Progress Notes (Signed)
Pt is taking Revlimid as prescribed with no side effects. 

## 2020-09-30 NOTE — Progress Notes (Signed)
[place] reviewed by and pt seen by Faythe Casa NP and pt approved for Velcade and Vit B12 injections today per NP. Hgb 10.4 today so Retacrit to be held per her parameters.                                                                              Yves Dill Aven tolerated Velcade and Vit B12 injections well without complaints or incident. VSS Pt discharged self ambulatory in satisfactory condition accompanied by family member

## 2020-09-30 NOTE — Patient Instructions (Signed)
[facility] Discharge Instructions for Patients Receiving Chemotherapy   Beginning January 23rd 2017 lab work for the [facility] will be done in the  Main lab at [facility] on 1st floor. If you have a lab appointment with the [place] please come in thru the  Main Entrance and check in at the main information desk   Today you received the following chemotherapy agents Velcade as well as Vit B12 injection. Follow-up as scheduled  To help prevent nausea and vomiting after your treatment, we encourage you to take your nausea medication   If you develop nausea and vomiting, or diarrhea that is not controlled by your medication, call the clinic.  The clinic phone number is [phone]. Office hours are Monday-Friday 8:30am-5:00pm.  BELOW ARE SYMPTOMS THAT SHOULD BE REPORTED IMMEDIATELY:  *FEVER GREATER THAN 101.0 F  *CHILLS WITH OR WITHOUT FEVER  NAUSEA AND VOMITING THAT IS NOT CONTROLLED WITH YOUR NAUSEA MEDICATION  *UNUSUAL SHORTNESS OF BREATH  *UNUSUAL BRUISING OR BLEEDING  TENDERNESS IN MOUTH AND THROAT WITH OR WITHOUT PRESENCE OF ULCERS  *URINARY PROBLEMS  *BOWEL PROBLEMS  UNUSUAL RASH Items with * indicate a potential emergency and should be followed up as soon as possible. If you have an emergency after office hours please contact your primary care physician or go to the nearest emergency department.  Please call the clinic during office hours if you have any questions or concerns.   You may also contact the Patient Navigator at [phone] should you have any questions or need assistance in obtaining follow up care.      Resources For Cancer Patients and their Caregivers ? American Cancer Society: Can assist with transportation, wigs, general needs, runs Look Good Feel Better.        628 843 8708 ? Cancer Care: Provides financial assistance, online support groups, medication/co-pay assistance.  1-800-813-HOPE 239 653 4203) ? [place] [facility] Assists [place] cancer patients and their families through emotional , educational and financial support.  832-441-6193 ? [place] DSS Where to apply for food stamps, Medicaid and utility assistance. 985-572-3618 ? [facility]: Transportation to medical appointments. 938 082 1145 ? Social Security Administration: May apply for disability if have a Stage IV cancer. 985-379-1238 (936)313-7306 ? [employer], Disability and Transit Services: Assists with nutrition, care and transit needs. [phone]

## 2020-09-30 NOTE — Patient Instructions (Signed)
[facility] at [facility] Discharge Instructions  You saw Jenny Burns, NP, today.   Thank you for choosing [facility] at [facility] to provide your oncology and hematology care.  To afford each patient quality time with our provider, please arrive at least 15 minutes before your scheduled appointment time.   If you have a lab appointment with the Cancer Center please come in thru the Main Entrance and check in at the main information desk.  You need to re-schedule your appointment should you arrive 10 or more minutes late.  We strive to give you quality time with our providers, and arriving late affects you and other patients whose appointments are after yours.  Also, if you no show three or more times for appointments you may be dismissed from the clinic at the providers discretion.     Again, thank you for choosing [facility].  Our hope is that these requests will decrease the amount of time that you wait before being seen by our physicians.       _____________________________________________________________  Should you have questions after your visit to [facility], please contact our office at [phone] and follow the prompts.  Our office hours are 8:00 a.m. and 4:30 p.m. Monday - Friday.  Please note that voicemails left after 4:00 p.m. may not be returned until the following business day.  We are closed weekends and major holidays.  You do have access to a nurse 24-7, just call the main number to the clinic [phone] and do not press any options, hold on the line and a nurse will answer the phone.    For prescription refill requests, have your pharmacy contact our office and allow 72 hours.    Due to Covid, you will need to wear a mask upon entering the hospital. If you do not have a mask, a mask will be given to you at the Main Entrance upon arrival. For doctor visits, patients may have 1 support person age 18  or older with them. For treatment visits, patients can not have anyone with them due to social distancing guidelines and our immunocompromised population.     

## 2020-09-30 NOTE — Progress Notes (Signed)
[place], [place] [address]   CLINIC:  Medical Oncology/Hematology  PCP:  Brittany Fairy, PA-C [address] [place] [address] [phone]   REASON FOR VISIT:  Follow-up for multiple myeloma  PRIOR THERAPY: None  NGS Results: Not done  CURRENT THERAPY: RVD  BRIEF ONCOLOGIC HISTORY:  Oncology History  Multiple myeloma not having achieved remission ([place])  01/20/2018 Initial Diagnosis   Multiple myeloma not having achieved remission ([place])   01/26/2018 -  Chemotherapy   The patient had dexamethasone (DECADRON) tablet 40 mg, 40 mg (100 % of original dose 40 mg), Oral,  Once, 17 of 19 cycles Dose modification: 40 mg (original dose 40 mg, Cycle 17), 40 mg (original dose 40 mg, Cycle 20) Administration: 40 mg (04/24/2019), 40 mg (05/23/2019), 40 mg (06/20/2019), 40 mg (07/11/2019), 40 mg (07/18/2019), 40 mg (07/25/2019), 40 mg (08/08/2019), 40 mg (08/15/2019), 40 mg (08/22/2019), 40 mg (09/05/2019), 40 mg (09/12/2019), 40 mg (09/19/2019), 40 mg (10/03/2019), 40 mg (10/10/2019), 40 mg (10/17/2019), 40 mg (10/31/2019), 40 mg (11/07/2019), 40 mg (11/14/2019), 40 mg (11/29/2019), 40 mg (12/06/2019), 40 mg (12/13/2019), 40 mg (01/03/2020), 40 mg (01/10/2020), 40 mg (01/17/2020), 40 mg (01/31/2020), 40 mg (02/07/2020), 40 mg (02/14/2020), 40 mg (04/17/2020), 40 mg (05/21/2020), 40 mg (06/04/2020), 40 mg (06/19/2020), 40 mg (06/26/2020), 40 mg (07/03/2020), 40 mg (07/17/2020), 40 mg (07/24/2020), 40 mg (07/31/2020), 40 mg (08/14/2020), 40 mg (08/21/2020), 40 mg (08/28/2020), 40 mg (09/11/2020), 40 mg (09/18/2020) dexamethasone (DECADRON) 4 MG tablet, 1 of 1 cycle, Start date: 04/24/2019, End date: -- lenalidomide (REVLIMID) 20 MG capsule, 1 of 1 cycle, Start date: 09/05/2020, End date: -- bortezomib SQ (VELCADE) chemo injection 2.5 mg, 1.3 mg/m2 = 2.5 mg, Subcutaneous,  Once, 33 of 35 cycles Administration: 2.5 mg (01/26/2018), 2.5 mg (02/02/2018), 2.5 mg (02/09/2018), 2.5 mg (02/16/2018), 2.5 mg  (02/23/2018), 2.5 mg (03/02/2018), 2.5 mg (03/09/2018), 2.5 mg (03/30/2018), 2.5 mg (04/06/2018), 2.5 mg (04/13/2018), 2.5 mg (04/21/2018), 2.5 mg (05/06/2018), 2.5 mg (05/11/2018), 2.5 mg (05/20/2018), 2.5 mg (05/27/2018), 2.5 mg (06/10/2018), 2.5 mg (06/17/2018), 2.5 mg (06/24/2018), 2.5 mg (07/08/2018), 2.5 mg (07/15/2018), 2.5 mg (07/22/2018), 2.5 mg (08/05/2018), 2.5 mg (08/12/2018), 2.5 mg (09/02/2018), 2.5 mg (09/09/2018), 2.5 mg (09/16/2018), 2.5 mg (09/30/2018), 2.5 mg (10/07/2018), 2.5 mg (10/14/2018), 2.5 mg (10/31/2018), 2.5 mg (11/07/2018), 2.5 mg (11/14/2018), 2.5 mg (11/28/2018), 2.25 mg (12/05/2018), 2.25 mg (12/12/2018), 2.25 mg (12/26/2018), 2.25 mg (01/02/2019), 2.25 mg (01/09/2019), 2.25 mg (01/23/2019), 2.25 mg (01/30/2019), 2.25 mg (02/06/2019), 2.25 mg (02/20/2019), 2.25 mg (02/27/2019), 2.25 mg (03/06/2019), 2.25 mg (03/20/2019), 2.25 mg (03/27/2019), 2.25 mg (04/03/2019), 2.25 mg (04/17/2019), 2.25 mg (04/24/2019), 2.25 mg (05/01/2019), 2.25 mg (05/15/2019), 2.25 mg (05/23/2019), 2.25 mg (05/30/2019), 2.25 mg (06/13/2019), 2.25 mg (06/20/2019), 2.25 mg (06/27/2019), 2.25 mg (07/11/2019), 2.25 mg (07/18/2019), 2.25 mg (07/25/2019), 2.25 mg (08/08/2019), 2.25 mg (08/15/2019), 2.25 mg (08/22/2019), 2.25 mg (09/05/2019), 2.25 mg (09/12/2019), 2.25 mg (09/19/2019), 2.25 mg (10/03/2019), 2.25 mg (10/10/2019), 2.25 mg (10/17/2019), 2.25 mg (10/31/2019), 2.25 mg (11/07/2019), 2.25 mg (11/14/2019), 2.25 mg (11/29/2019), 2.25 mg (12/06/2019), 2.25 mg (12/13/2019), 2.25 mg (01/03/2020), 2.25 mg (01/10/2020), 2.25 mg (01/17/2020), 2.25 mg (01/31/2020), 2.25 mg (02/07/2020), 2.25 mg (02/14/2020), 2.25 mg (04/17/2020), 2.25 mg (05/21/2020), 2.25 mg (05/28/2020), 2.25 mg (06/04/2020), 2.25 mg (06/19/2020), 2.25 mg (06/26/2020), 2.25 mg (07/03/2020), 2.25 mg (07/17/2020), 2.25 mg (07/24/2020), 2.25 mg (07/31/2020), 2.25 mg (08/14/2020), 2.25 mg (08/21/2020), 2.25 mg (08/28/2020), 2.25 mg (09/11/2020), 2.25 mg (09/18/2020)  for chemotherapy treatment.  CANCER STAGING: Cancer Staging No matching  staging information was found for the patient.  INTERVAL HISTORY:  [name], a 79 y.o. female, returns for routine follow-up and consideration for next cycle of chemotherapy. Brittany Archer was last seen on 08/21/2020.  Due for day #15 of cycle #33 of bortezomib today.   Today she is accompanied by her niece. Overall, she tells me she has been feeling pretty well.  Patient does not know when she started back on her Revlimid.  She had misplaced the bottle at her last visit and a new one was sent to her.  Her niece does not know either.  Her appetite is good.  Spoke to daughter over the telephone who states she has 2 pills left and then she has her week off.  Overall, she feels ready for next cycle of chemo today.    REVIEW OF SYSTEMS:  Review of Systems  Constitutional: Positive for fatigue. Negative for appetite change and unexpected weight change.  Gastrointestinal: Negative for diarrhea and nausea.  Neurological: Negative for numbness.  All other systems reviewed and are negative.   PAST MEDICAL/SURGICAL HISTORY:  Past Medical History:  Diagnosis Date  . Breast cancer ([place])    left breast/ 2008/ surg/ rad tx  . Coronary artery disease   . Diabetes mellitus    Past Surgical History:  Procedure Laterality Date  . ABDOMINAL HYSTERECTOMY    . BREAST SURGERY    . DEBRIDEMENT MANDIBLE N/A 02/22/2020   Procedure: INCISION AND DRAINAGE WITH DEBRIDEMENT MANDIBLE;  Surgeon: Michael Litter, DMD;  Location: [facility] ORS;  Service: Oral Surgery;  Laterality: N/A;  . TOOTH EXTRACTION N/A 02/22/2020   Procedure: DENTAL RESTORATION/EXTRACTIONS;  Surgeon: Michael Litter, DMD;  Location: [facility];  Service: Oral Surgery;  Laterality: N/A;  DENTAL KIT REQUESTED    SOCIAL HISTORY:  Social History   Socioeconomic History  . Marital status: Divorced    Spouse name: Not on file  . Number of children: Not on file  . Years of education: Not on file  . Highest education level: Not on file    Occupational History  . Not on file  Tobacco Use  . Smoking status: Never Smoker  . Smokeless tobacco: Never Used  Vaping Use  . Vaping Use: Never used  Substance and Sexual Activity  . Alcohol use: No  . Drug use: No  . Sexual activity: Yes    Birth control/protection: Surgical  Other Topics Concern  . Not on file  Social History Narrative  . Not on file   Social Determinants of Health   Financial Resource Strain:   . Difficulty of Paying Living Expenses: Not on file  Food Insecurity:   . Worried About Charity fundraiser in the Last Year: Not on file  . Ran Out of Food in the Last Year: Not on file  Transportation Needs:   . Lack of Transportation (Medical): Not on file  . Lack of Transportation (Non-Medical): Not on file  Physical Activity:   . Days of Exercise per Week: Not on file  . Minutes of Exercise per Session: Not on file  Stress:   . Feeling of Stress : Not on file  Social Connections:   . Frequency of Communication with Friends and Family: Not on file  . Frequency of Social Gatherings with Friends and Family: Not on file  . Attends Religious Services: Not on file  . Active Member of Clubs or Organizations: Not on file  . Attends  Club or Organization Meetings: Not on file  . Marital Status: Not on file  Intimate Partner Violence:   . Fear of Current or Ex-Partner: Not on file  . Emotionally Abused: Not on file  . Physically Abused: Not on file  . Sexually Abused: Not on file    FAMILY HISTORY:  Family History  Problem Relation Age of Onset  . Obesity Sister     CURRENT MEDICATIONS:  Current Outpatient Medications  Medication Sig Dispense Refill  . acyclovir (ZOVIRAX) 400 MG tablet TAKE 1 TABLET BY MOUTH TWICE DAILY 60 tablet 11  . aspirin 81 MG tablet Take 81 mg by mouth daily.      . bortezomib IV (VELCADE) 3.5 MG injection Inject 3.5 mg into the vein once a week. weekly     . chlorhexidine (PERIDEX) 0.12 % solution Use as directed 15 mLs in  the mouth or throat 2 (two) times daily.     Marland Kitchen dexamethasone (DECADRON) 4 MG tablet Take 10 tablets (40 mg) on days 1, 8, and 15 of chemo. Repeat every 21 days. 30 tablet 3  . glipiZIDE (GLUCOTROL) 5 MG tablet Take 5 mg by mouth daily before breakfast.     . ketoconazole (NIZORAL) 2 % cream Apply topically.    Marland Kitchen lenalidomide (REVLIMID) 20 MG capsule Take 1 capsule by mouth once daily for 14 days on, and 7 days off of a 21 day cycle. 14 capsule 0  . lisinopril-hydrochlorothiazide (PRINZIDE,ZESTORETIC) 20-25 MG tablet Take 1 tablet by mouth every morning.     . magnesium oxide (MAG-OX) 400 (241.3 Mg) MG tablet Take 1 tablet (400 mg total) by mouth in the morning, at noon, and at bedtime. 90 tablet 6  . metFORMIN (GLUCOPHAGE) 1000 MG tablet Take 1,000 mg by mouth 2 times daily at 12 noon and 4 pm.      . ofloxacin (OCUFLOX) 0.3 % ophthalmic solution Place 1 drop into both eyes.     . potassium chloride SA (KLOR-CON) 20 MEQ tablet TAKE (2) TABLETS BY MOUTH THREE TIMES DAILY. 168 tablet 11  . prednisoLONE acetate (PRED FORTE) 1 % ophthalmic suspension Place 1 drop into both eyes.      No current facility-administered medications for this visit.    ALLERGIES:  Allergies  Allergen Reactions  . Motrin [Ibuprofen] Rash    PHYSICAL EXAM:  Performance status (ECOG): 1 - Symptomatic but completely ambulatory  Vitals:   09/30/20 1301  BP: (!) 128/56  Pulse: 64  Resp: 17  Temp: (!) 96.8 F (36 C)  SpO2: 100%   Wt Readings from Last 3 Encounters:  09/30/20 176 lb 3.2 oz (79.9 kg)  09/18/20 169 lb 12.8 oz (77 kg)  08/28/20 166 lb 10.7 oz (75.6 kg)   Physical Exam Vitals reviewed.  Constitutional:      Appearance: Normal appearance.  Musculoskeletal:     Right lower leg: No edema.     Left lower leg: No edema.  Neurological:     General: No focal deficit present.     Mental Status: She is alert and oriented to person, place, and time.  Psychiatric:        Mood and Affect: Mood normal.         Behavior: Behavior normal.      LABORATORY DATA:  I have reviewed the labs as listed.  CBC Latest Ref Rng & Units 09/30/2020 09/18/2020 09/11/2020  WBC 4.0 - 10.5 K/uL 3.8(L) 3.8(L) 5.0  Hemoglobin 12.0 - 15.0 g/dL 10.4(L)  10.3(L) 9.7(L)  Hematocrit 36 - 46 % 31.8(L) 32.4(L) 30.6(L)  Platelets 150 - 400 K/uL 216 205 201   CMP Latest Ref Rng & Units 09/30/2020 09/18/2020 09/11/2020  Glucose 70 - 99 mg/dL 90 155(H) 144(H)  BUN 8 - 23 mg/dL _0 Creatinine 0.44 - 1.00 mg/dL 1.00 0.99 0.93  Sodium 135 - 145 mmol/L 136 135 140  Potassium 3.5 - 5.1 mmol/L 3.8 3.4(L) 3.3(L)  Chloride 98 - 111 mmol/L 106 102 104  CO2 22 - 32 mmol/L 20(L) 24 27  Calcium 8.9 - 10.3 mg/dL 9.5 9.7 9.3  Total Protein 6.5 - 8.1 g/dL 6.9 6.7 6.7  Total Bilirubin 0.3 - 1.2 mg/dL 0.6 0.6 0.9  Alkaline Phos 38 - 126 U/L 61 61 50  AST 15 - 41 U/L 10(L) 10(L) 10(L)  ALT 0 - 44 U/L _1 Lab Results  Component Value Date   LDH 107 09/18/2020   LDH 136 08/28/2020   LDH 148 08/14/2020   Lab Results  Component Value Date   TOTALPROTELP 6.3 09/18/2020   TOTALPROTELP 6.2 09/18/2020   ALBUMINELP 3.4 09/18/2020   A1GS 0.2 09/18/2020   A2GS 0.8 09/18/2020   BETS 1.0 09/18/2020   GAMS 0.9 09/18/2020   MSPIKE 0.5 (H) 09/18/2020   SPEI Comment 09/18/2020   Lab Results  Component Value Date   KPAFRELGTCHN 39.1 (H) 09/18/2020   LAMBDASER 20.3 09/18/2020   KAPLAMBRATIO 1.93 (H) 09/18/2020    DIAGNOSTIC IMAGING:  I have independently reviewed the scans and discussed with the patient. No results found.   ASSESSMENT:  1. IgA kappa plasma cell myeloma, stage I: -RVD started on 01/09/2018, held since 02/14/2020 due to mandible abscess. -Myeloma labs on 05/14/2020 showed progression with M spike of 0.5 g. -RVD started back on 05/21/2020. -Myeloma labs on 07/10/2020 shows M spike improved to 0.3 g from 0.6 g previously.  Free light chain ratio is 1.74 with kappa light chains 28.4. -Myeloma panel from  09/18/2020 shows M spike 0.5 g.  Kappa light chains are 39.1 and ratio is 1.9.  2. Osteomyelitis of the right mandible/dental abscess: -Finished IV ceftriaxone on 04/03/2020. Finished oral antibiotics. -We will hold Xgeva indefinitely.   PLAN:  1. IgA kappa plasma cell myeloma, stage I: -She is tolerating Revlimid and Velcade 3 weeks on 1 week off very well. -Labs are stable with M spike of 0.5 g and ratio 1.9. -We started Revlimid-has about 2 to 3 days left.  Spoke to daughter who is aware she will then take 1 week off before restarting. -She will proceed with her next cycle of Velcade today. -I plan to see her back in 4 weeks with repeat myeloma labs.  2. Severe hypokalemia: -Potassium is 3.8.  Continue potassium 40 mEq 3 times a day. -She is occasionally noncompliant.  3. Osteomyelitis of the right mandible/dental abscess: -Bisphosphonate on hold indefinitely.  4. Hypomagnesemia: -Magnesium today is 1.7.  Continue 3 times daily.  5. 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 -Continue Retacrit as needed.  Hemoglobin today is 10.4.  No Retacrit today.   Orders placed this encounter:  No orders of the defined types were placed in this encounter.    Faythe Casa, NP 09/30/2020 2:01 PM  [place] [phone]   I, Milinda Antis, am acting as a scribe for Dr. Sanda Linger.  I, Derek Jack MD, have reviewed the above documentation for accuracy and completeness, and I agree with  the above.

## 2020-10-04 ENCOUNTER — Other Ambulatory Visit (HOSPITAL_COMMUNITY): Payer: Self-pay | Admitting: *Deleted

## 2020-10-04 DIAGNOSIS — C9000 Multiple myeloma not having achieved remission: Secondary | ICD-10-CM

## 2020-10-04 MED ORDER — LENALIDOMIDE 20 MG PO CAPS: ORAL_CAPSULE | ORAL | 0 refills | Status: DC

## 2020-10-04 NOTE — Telephone Encounter (Signed)
Chart reviewed, revlimid refilled. 

## 2020-10-13 IMAGING — CT CT HEAD W/O CM
3 series · 16 of 47 positions shown, 19 images · non-contrast
Comparison: None.

CLINICAL DATA: Right-sided tooth abscess, generalized weakness,
fell

EXAM:
CT HEAD WITHOUT CONTRAST
TECHNIQUE: Contiguous axial images were obtained from the base of the skull
through the vertex without intravenous contrast.

[Series 2: head w o · axial · 0.41mm/px · z∈[+106,+231]mm · 10 of 30 slices shown, 13 images]
[im 3/30  brain]
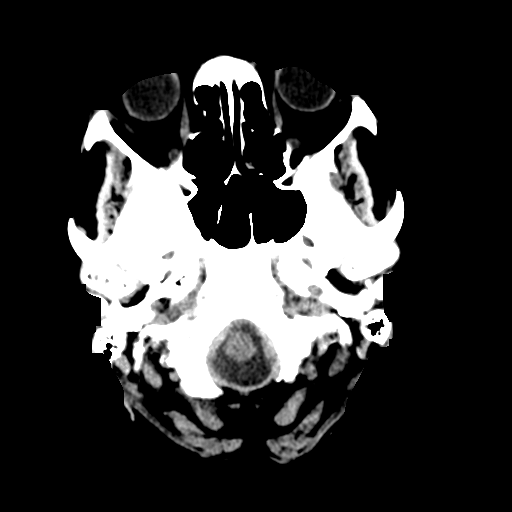
[im 3/30  bone]
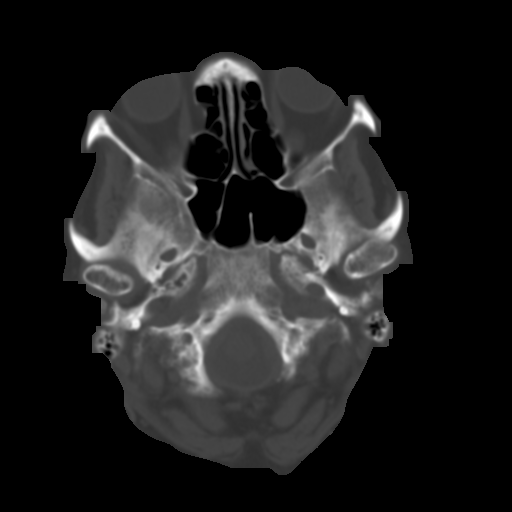
[im 6/30  brain]
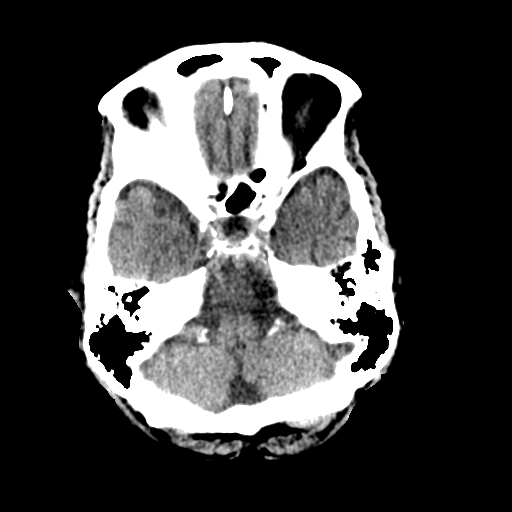
[im 9/30  brain]
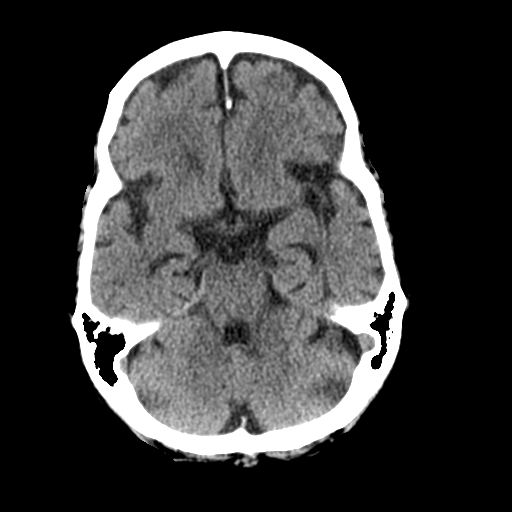
[im 11/30  brain]
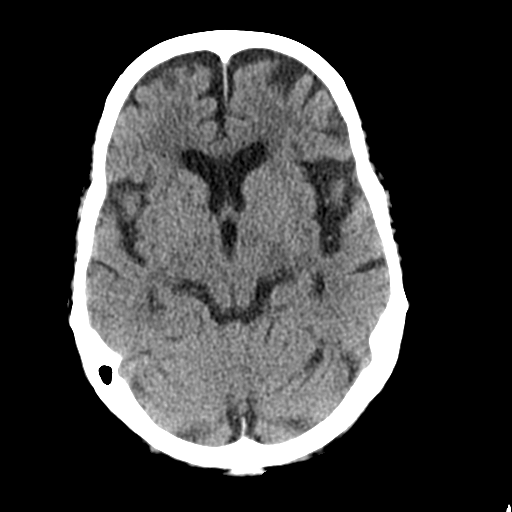
[im 14/30  brain]
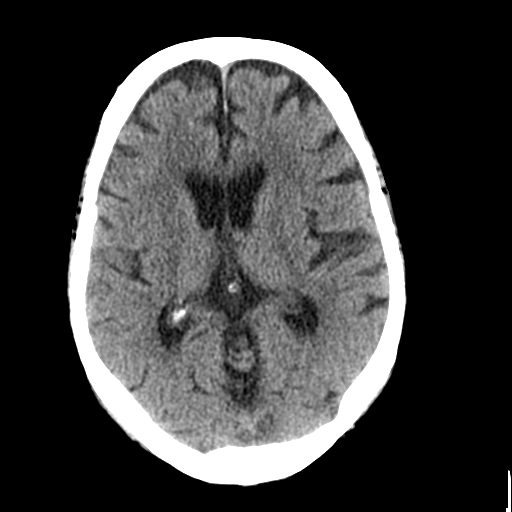
[im 14/30  bone]
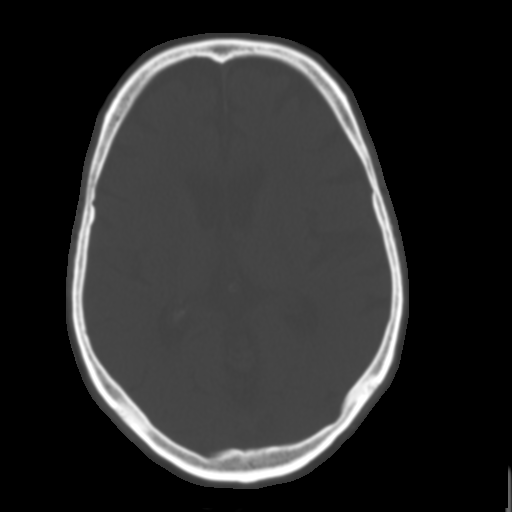
[im 17/30  brain]
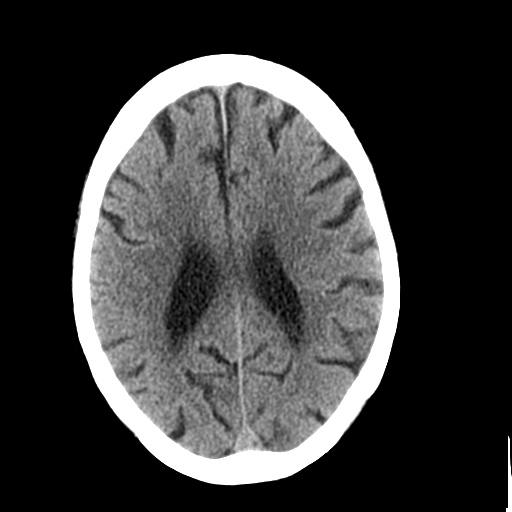
[im 20/30  brain]
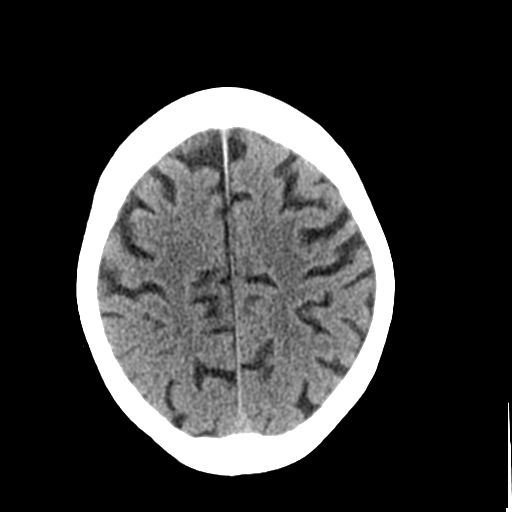
[im 23/30  brain]
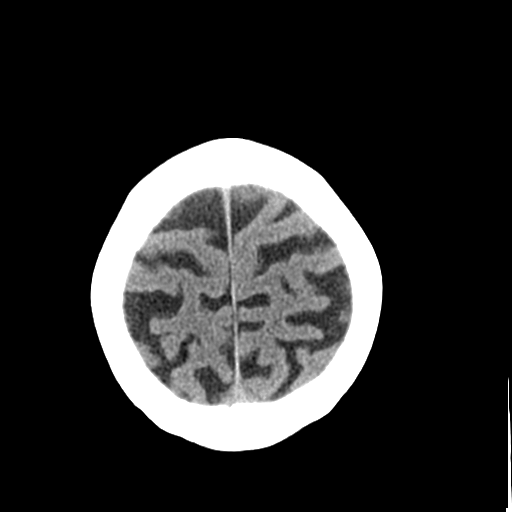
[im 25/30  brain]
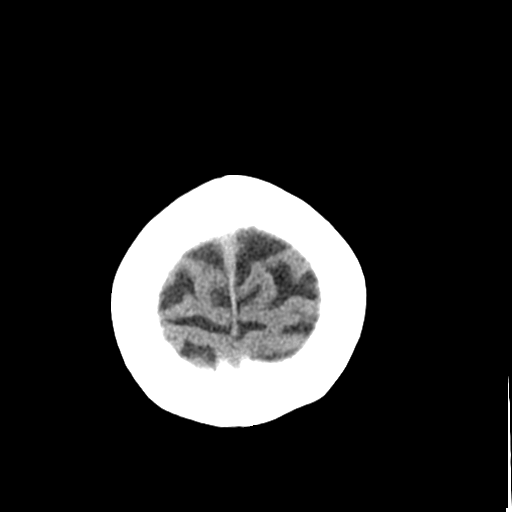
[im 25/30  bone]
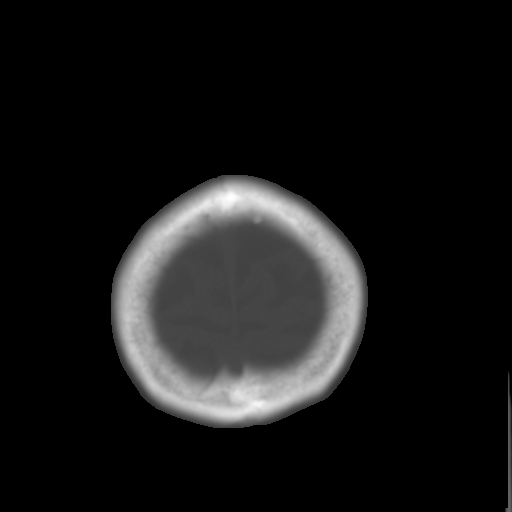
[im 28/30  brain]
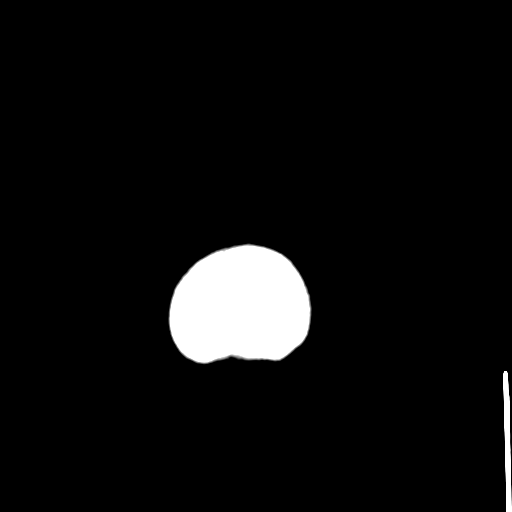

[Series 4: coronal soft · coronal · 0.32mm/px · 3 of 65 slices shown]
[im 22/65  brain]
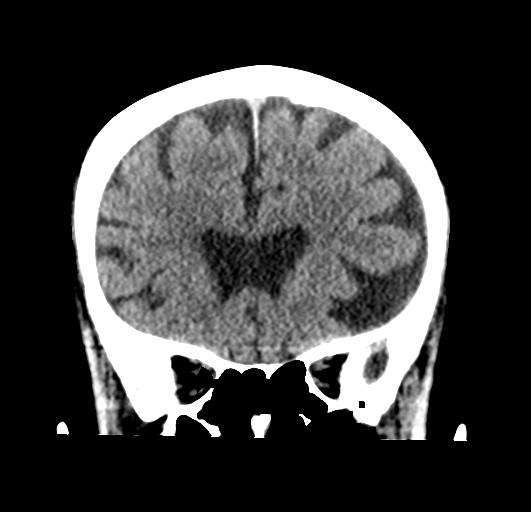
[im 29/65  brain]
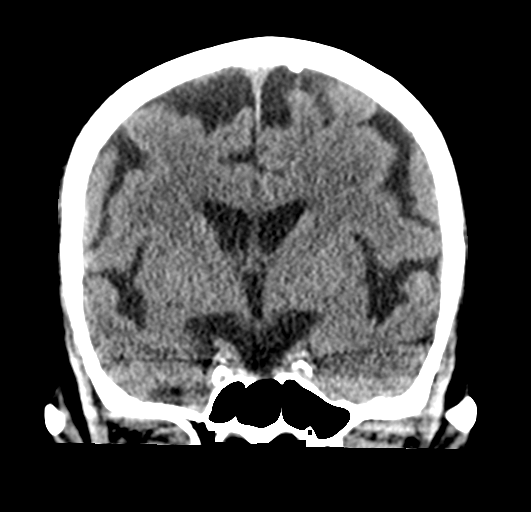
[im 36/65  brain]
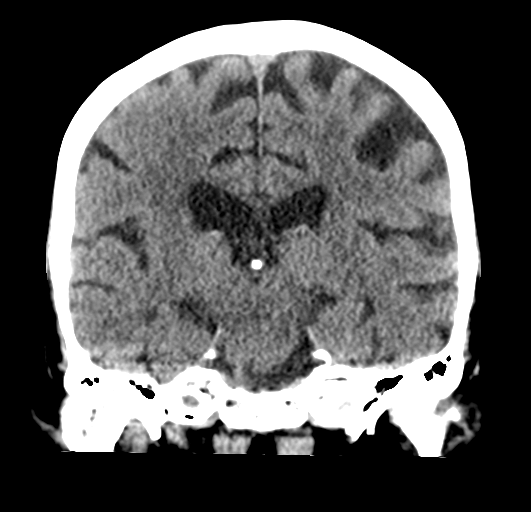

[Series 5: sagittal soft · sagittal · 0.30mm/px · 3 of 54 slices shown]
[im 18/54  brain]
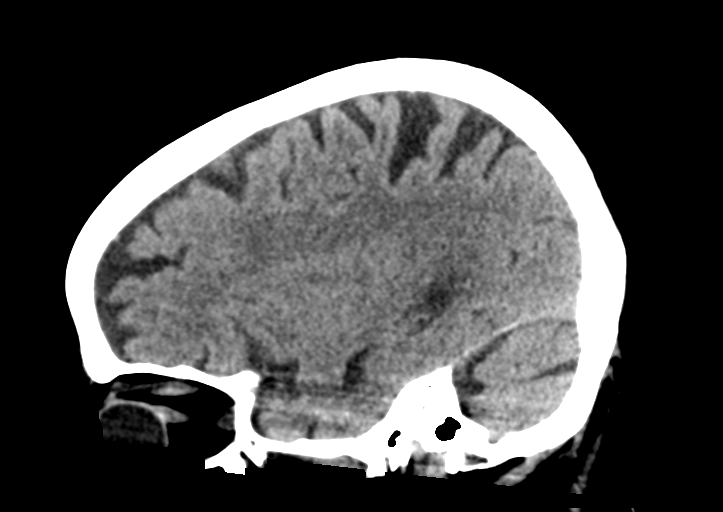
[im 27/54  brain]
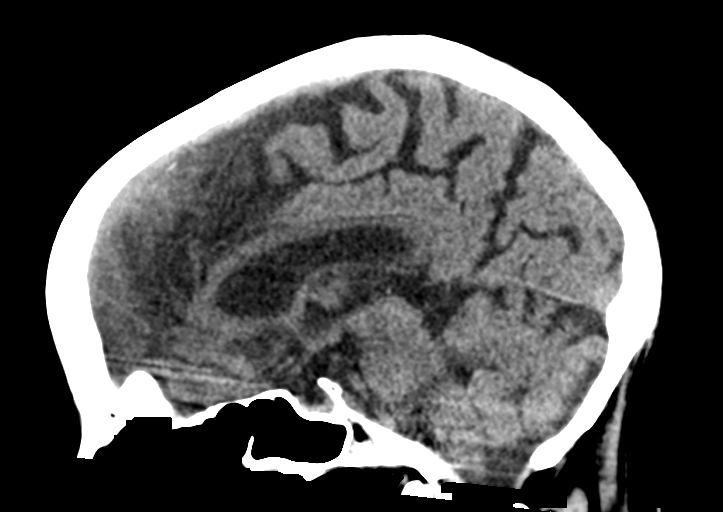
[im 36/54  brain]
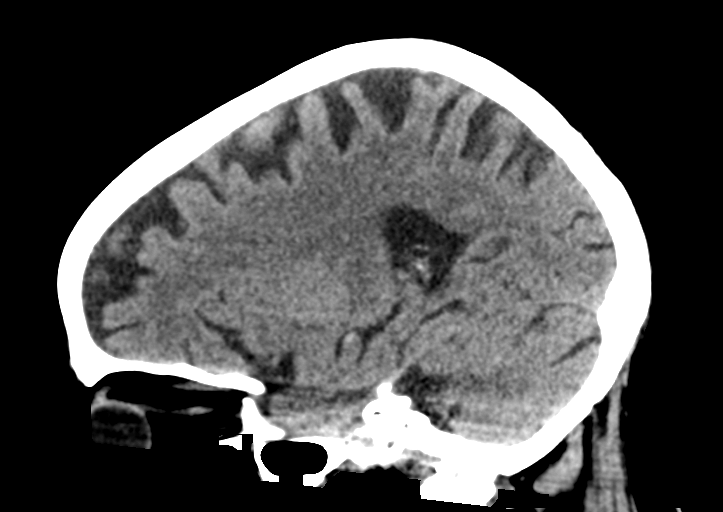

[16 of 47 positions shown; findings below may reference images not displayed]

FINDINGS: Brain: No acute infarct or hemorrhage. Lateral ventricles and
midline structures are unremarkable. No acute extra-axial fluid
collections. No mass effect.

Vascular: No hyperdense vessel or unexpected calcification.

Skull: Normal. Negative for fracture or focal lesion.

Sinuses/Orbits: No acute finding.

Other: None
IMPRESSION: 1. No acute intracranial process.

## 2020-10-14 ENCOUNTER — Inpatient Hospital Stay (HOSPITAL_COMMUNITY): Payer: Medicare Other

## 2020-10-14 ENCOUNTER — Encounter (HOSPITAL_COMMUNITY): Payer: Self-pay

## 2020-10-14 ENCOUNTER — Other Ambulatory Visit: Payer: Self-pay

## 2020-10-14 VITALS — BP 131/72 | HR 67 | Temp 97.0°F | Resp 18 | Wt 168.2 lb

## 2020-10-14 DIAGNOSIS — Z5112 Encounter for antineoplastic immunotherapy: Secondary | ICD-10-CM | POA: Diagnosis not present

## 2020-10-14 DIAGNOSIS — Z5111 Encounter for antineoplastic chemotherapy: Secondary | ICD-10-CM

## 2020-10-14 DIAGNOSIS — C9000 Multiple myeloma not having achieved remission: Secondary | ICD-10-CM

## 2020-10-14 DIAGNOSIS — E538 Deficiency of other specified B group vitamins: Secondary | ICD-10-CM

## 2020-10-14 LAB — COMPREHENSIVE METABOLIC PANEL
ALT: 7 U/L (ref 0–44)
AST: 10 U/L — ABNORMAL LOW (ref 15–41)
Albumin: 3.4 g/dL — ABNORMAL LOW (ref 3.5–5.0)
Alkaline Phosphatase: 55 U/L (ref 38–126)
Anion gap: 10 (ref 5–15)
BUN: 20 mg/dL (ref 8–23)
CO2: 25 mmol/L (ref 22–32)
Calcium: 8.7 mg/dL — ABNORMAL LOW (ref 8.9–10.3)
Chloride: 104 mmol/L (ref 98–111)
Creatinine, Ser: 1.06 mg/dL — ABNORMAL HIGH (ref 0.44–1.00)
GFR, Estimated: 50 mL/min — ABNORMAL LOW (ref >60)
Glucose, Bld: 160 mg/dL — ABNORMAL HIGH (ref 70–99)
Potassium: 3.4 mmol/L — ABNORMAL LOW (ref 3.5–5.1)
Sodium: 139 mmol/L (ref 135–145)
Total Bilirubin: 0.7 mg/dL (ref 0.3–1.2)
Total Protein: 6.9 g/dL (ref 6.5–8.1)

## 2020-10-14 LAB — CBC WITH DIFFERENTIAL/PLATELET
Abs Immature Granulocytes: 0.01 10*3/uL (ref 0.00–0.07)
Basophils Absolute: 0.1 10*3/uL (ref 0.0–0.1)
Basophils Relative: 2 %
Eosinophils Absolute: 0.2 10*3/uL (ref 0.0–0.5)
Eosinophils Relative: 6 %
HCT: 30 % — ABNORMAL LOW (ref 36.0–46.0)
Hemoglobin: 9.8 g/dL — ABNORMAL LOW (ref 12.0–15.0)
Immature Granulocytes: 0 %
Lymphocytes Relative: 24 %
Lymphs Abs: 0.9 10*3/uL (ref 0.7–4.0)
MCH: 33.3 pg (ref 26.0–34.0)
MCHC: 32.7 g/dL (ref 30.0–36.0)
MCV: 102 fL — ABNORMAL HIGH (ref 80.0–100.0)
Monocytes Absolute: 0.3 10*3/uL (ref 0.1–1.0)
Monocytes Relative: 7 %
Neutro Abs: 2.5 10*3/uL (ref 1.7–7.7)
Neutrophils Relative %: 61 %
Platelets: 199 10*3/uL (ref 150–400)
RBC: 2.94 MIL/uL — ABNORMAL LOW (ref 3.87–5.11)
RDW: 15.1 % (ref 11.5–15.5)
WBC: 4 10*3/uL (ref 4.0–10.5)
nRBC: 0 % (ref 0.0–0.2)

## 2020-10-14 MED ORDER — DEXAMETHASONE 4 MG PO TABS
40.0000 mg | ORAL_TABLET | Freq: Once | ORAL | Status: AC
  Administered 2020-10-14: 40 mg via ORAL
  Filled 2020-10-14: qty 10

## 2020-10-14 MED ORDER — BORTEZOMIB CHEMO SQ INJECTION 3.5 MG (2.5MG/ML)
1.3000 mg/m2 | Freq: Once | INTRAMUSCULAR | Status: AC
  Administered 2020-10-14: 2.25 mg via SUBCUTANEOUS
  Filled 2020-10-14: qty 0.9

## 2020-10-14 MED ORDER — DEXAMETHASONE 4 MG PO TABS
ORAL_TABLET | ORAL | Status: AC
  Filled 2020-10-14: qty 5

## 2020-10-14 MED ORDER — EPOETIN ALFA-EPBX 20000 UNIT/ML IJ SOLN
20000.0000 [IU] | Freq: Once | INTRAMUSCULAR | Status: AC
  Administered 2020-10-14: 20000 [IU] via SUBCUTANEOUS

## 2020-10-14 MED ORDER — PROCHLORPERAZINE MALEATE 10 MG PO TABS
10.0000 mg | ORAL_TABLET | Freq: Once | ORAL | Status: AC
  Administered 2020-10-14: 10 mg via ORAL

## 2020-10-14 MED ORDER — EPOETIN ALFA-EPBX 10000 UNIT/ML IJ SOLN: 20000.0000 [IU] | Freq: Once | INTRAMUSCULAR | Status: DC

## 2020-10-14 MED ORDER — EPOETIN ALFA-EPBX 20000 UNIT/ML IJ SOLN
INTRAMUSCULAR | Status: AC
  Filled 2020-10-14: qty 1

## 2020-10-14 MED ORDER — PROCHLORPERAZINE MALEATE 10 MG PO TABS
ORAL_TABLET | ORAL | Status: AC
  Filled 2020-10-14: qty 1

## 2020-10-14 NOTE — Patient Instructions (Signed)
[facility] Discharge Instructions for Patients Receiving Chemotherapy  Today you received the following chemotherapy agents   To help prevent nausea and vomiting after your treatment, we encourage you to take your nausea medication   If you develop nausea and vomiting that is not controlled by your nausea medication, call the clinic.   BELOW ARE SYMPTOMS THAT SHOULD BE REPORTED IMMEDIATELY:  *FEVER GREATER THAN 100.5 F  *CHILLS WITH OR WITHOUT FEVER  NAUSEA AND VOMITING THAT IS NOT CONTROLLED WITH YOUR NAUSEA MEDICATION  *UNUSUAL SHORTNESS OF BREATH  *UNUSUAL BRUISING OR BLEEDING  TENDERNESS IN MOUTH AND THROAT WITH OR WITHOUT PRESENCE OF ULCERS  *URINARY PROBLEMS  *BOWEL PROBLEMS  UNUSUAL RASH Items with * indicate a potential emergency and should be followed up as soon as possible.  Feel free to call the clinic should you have any questions or concerns. The clinic phone number is [phone].  Please show the CHEMO ALERT CARD at check-in to the Emergency Department and triage nurse.   

## 2020-10-14 NOTE — Progress Notes (Signed)
Patient presents today for Velcade injection. Labs within parameters for treatment. Vital signs stable. Patient denies pain today. Patient denies any significant changes since her last visit. Patient states she is taking Revlimid as prescribed by the physician with no complaints of any side effects .   Treatment given today per MD orders. Tolerated infusion without adverse affects. Vital signs stable. No complaints at this time. Discharged from clinic ambulatory in stable condition. Alert and oriented x 3. F/U with [facility] as scheduled.

## 2020-10-21 ENCOUNTER — Inpatient Hospital Stay (HOSPITAL_COMMUNITY): Payer: Medicare Other

## 2020-10-21 ENCOUNTER — Encounter (HOSPITAL_COMMUNITY): Payer: Self-pay

## 2020-10-21 ENCOUNTER — Other Ambulatory Visit: Payer: Self-pay

## 2020-10-21 VITALS — BP 147/62 | HR 72 | Temp 97.1°F | Resp 17

## 2020-10-21 DIAGNOSIS — Z5112 Encounter for antineoplastic immunotherapy: Secondary | ICD-10-CM | POA: Diagnosis not present

## 2020-10-21 DIAGNOSIS — C9000 Multiple myeloma not having achieved remission: Secondary | ICD-10-CM

## 2020-10-21 LAB — CBC WITH DIFFERENTIAL/PLATELET
Abs Immature Granulocytes: 0.02 10*3/uL (ref 0.00–0.07)
Basophils Absolute: 0 10*3/uL (ref 0.0–0.1)
Basophils Relative: 1 %
Eosinophils Absolute: 0.2 10*3/uL (ref 0.0–0.5)
Eosinophils Relative: 6 %
HCT: 33.6 % — ABNORMAL LOW (ref 36.0–46.0)
Hemoglobin: 10.7 g/dL — ABNORMAL LOW (ref 12.0–15.0)
Immature Granulocytes: 1 %
Lymphocytes Relative: 24 %
Lymphs Abs: 0.8 10*3/uL (ref 0.7–4.0)
MCH: 33.1 pg (ref 26.0–34.0)
MCHC: 31.8 g/dL (ref 30.0–36.0)
MCV: 104 fL — ABNORMAL HIGH (ref 80.0–100.0)
Monocytes Absolute: 0.4 10*3/uL (ref 0.1–1.0)
Monocytes Relative: 11 %
Neutro Abs: 1.9 10*3/uL (ref 1.7–7.7)
Neutrophils Relative %: 57 %
Platelets: 185 10*3/uL (ref 150–400)
RBC: 3.23 MIL/uL — ABNORMAL LOW (ref 3.87–5.11)
RDW: 15.3 % (ref 11.5–15.5)
WBC: 3.3 10*3/uL — ABNORMAL LOW (ref 4.0–10.5)
nRBC: 0 % (ref 0.0–0.2)

## 2020-10-21 LAB — COMPREHENSIVE METABOLIC PANEL
ALT: 10 U/L (ref 0–44)
AST: 10 U/L — ABNORMAL LOW (ref 15–41)
Albumin: 3.4 g/dL — ABNORMAL LOW (ref 3.5–5.0)
Alkaline Phosphatase: 59 U/L (ref 38–126)
Anion gap: 8 (ref 5–15)
BUN: 11 mg/dL (ref 8–23)
CO2: 28 mmol/L (ref 22–32)
Calcium: 9.3 mg/dL (ref 8.9–10.3)
Chloride: 102 mmol/L (ref 98–111)
Creatinine, Ser: 1.08 mg/dL — ABNORMAL HIGH (ref 0.44–1.00)
GFR, Estimated: 52 mL/min — ABNORMAL LOW (ref >60)
Glucose, Bld: 165 mg/dL — ABNORMAL HIGH (ref 70–99)
Potassium: 3 mmol/L — ABNORMAL LOW (ref 3.5–5.1)
Sodium: 138 mmol/L (ref 135–145)
Total Bilirubin: 0.5 mg/dL (ref 0.3–1.2)
Total Protein: 6.5 g/dL (ref 6.5–8.1)

## 2020-10-21 MED ORDER — BORTEZOMIB CHEMO SQ INJECTION 3.5 MG (2.5MG/ML)
1.3000 mg/m2 | Freq: Once | INTRAMUSCULAR | Status: AC
  Administered 2020-10-21: 2.25 mg via SUBCUTANEOUS
  Filled 2020-10-21: qty 0.9

## 2020-10-21 MED ORDER — DEXAMETHASONE 4 MG PO TABS
40.0000 mg | ORAL_TABLET | Freq: Once | ORAL | Status: AC
  Administered 2020-10-21: 40 mg via ORAL
  Filled 2020-10-21: qty 10

## 2020-10-21 MED ORDER — POTASSIUM CHLORIDE CRYS ER 20 MEQ PO TBCR
40.0000 meq | EXTENDED_RELEASE_TABLET | Freq: Once | ORAL | Status: AC
  Administered 2020-10-21: 40 meq via ORAL
  Filled 2020-10-21: qty 2

## 2020-10-21 MED ORDER — PROCHLORPERAZINE MALEATE 10 MG PO TABS
10.0000 mg | ORAL_TABLET | Freq: Once | ORAL | Status: AC
  Administered 2020-10-21: 10 mg via ORAL
  Filled 2020-10-21: qty 1

## 2020-10-21 NOTE — Patient Instructions (Signed)
[facility] Discharge Instructions for Patients Receiving Chemotherapy   Beginning January 23rd 2017 lab work for the [facility] will be done in the  Main lab at [facility] on 1st floor. If you have a lab appointment with the [facility] please come in thru the  Main Entrance and check in at the main information desk   Today you received the following chemotherapy agents Velcade injection. Follow-up as scheduled  To help prevent nausea and vomiting after your treatment, we encourage you to take your nausea medication   If you develop nausea and vomiting, or diarrhea that is not controlled by your medication, call the clinic.  The clinic phone number is [phone]. Office hours are Monday-Friday 8:30am-5:00pm.  BELOW ARE SYMPTOMS THAT SHOULD BE REPORTED IMMEDIATELY:  *FEVER GREATER THAN 101.0 F  *CHILLS WITH OR WITHOUT FEVER  NAUSEA AND VOMITING THAT IS NOT CONTROLLED WITH YOUR NAUSEA MEDICATION  *UNUSUAL SHORTNESS OF BREATH  *UNUSUAL BRUISING OR BLEEDING  TENDERNESS IN MOUTH AND THROAT WITH OR WITHOUT PRESENCE OF ULCERS  *URINARY PROBLEMS  *BOWEL PROBLEMS  UNUSUAL RASH Items with * indicate a potential emergency and should be followed up as soon as possible. If you have an emergency after office hours please contact your primary care physician or go to the nearest emergency department.  Please call the clinic during office hours if you have any questions or concerns.   You may also contact the Patient Navigator at [phone] should you have any questions or need assistance in obtaining follow up care.      Resources For Cancer Patients and their Caregivers ? American Cancer Society: Can assist with transportation, wigs, general needs, runs Look Good Feel Better.        941-738-4600 ? Cancer Care: Provides financial assistance, online support groups, medication/co-pay assistance.  1-800-813-HOPE 2491746107) ? [place] Assists [place] cancer patients and their families through emotional , educational and financial support.  939-685-2786 ? [place] DSS Where to apply for food stamps, Medicaid and utility assistance. 580-779-3258 ? [facility]: Transportation to medical appointments. ([phone] 043-3124 ? Social Security Administration: May apply for disability if have a Stage IV cancer. 830 667 2514 (202)[phone] ? [employer], Disability and Transit Services: Assists with nutrition, care and transit needs. ([phone]

## 2020-10-21 NOTE — Progress Notes (Signed)
Brittany Archer tolerated Velcade injection well without complaints or incident. Labs reviewed with JBurns NP prior to administering this medication and Potassium 40 meq PO was ordered today for K+ of 3.0 per NP. Hgb 10.7 today so Retacrit injection was held per insurance parameters Pt continues to take her Revlimid as prescribed without issues.VSS Pt discharged self ambulatory in satisfactory condition accompanied by family member.

## 2020-10-28 ENCOUNTER — Inpatient Hospital Stay (HOSPITAL_COMMUNITY): Payer: Medicare Other

## 2020-10-28 ENCOUNTER — Inpatient Hospital Stay (HOSPITAL_COMMUNITY): Payer: Medicare Other | Attending: Hematology

## 2020-10-28 ENCOUNTER — Ambulatory Visit (HOSPITAL_COMMUNITY): Payer: Medicare Other | Admitting: Hematology

## 2020-10-28 DIAGNOSIS — E538 Deficiency of other specified B group vitamins: Secondary | ICD-10-CM | POA: Insufficient documentation

## 2020-10-28 DIAGNOSIS — Z5112 Encounter for antineoplastic immunotherapy: Secondary | ICD-10-CM | POA: Insufficient documentation

## 2020-10-28 DIAGNOSIS — C9000 Multiple myeloma not having achieved remission: Secondary | ICD-10-CM | POA: Insufficient documentation

## 2020-10-29 ENCOUNTER — Encounter: Payer: Self-pay | Admitting: Plastic Surgery

## 2020-10-29 ENCOUNTER — Ambulatory Visit (INDEPENDENT_AMBULATORY_CARE_PROVIDER_SITE_OTHER): Payer: Medicare Other | Admitting: Plastic Surgery

## 2020-10-29 ENCOUNTER — Other Ambulatory Visit: Payer: Self-pay

## 2020-10-29 VITALS — BP 171/66 | HR 62 | Temp 98.7°F | Ht 64.0 in | Wt 167.4 lb

## 2020-10-29 DIAGNOSIS — C9000 Multiple myeloma not having achieved remission: Secondary | ICD-10-CM | POA: Diagnosis not present

## 2020-10-29 DIAGNOSIS — E11628 Type 2 diabetes mellitus with other skin complications: Secondary | ICD-10-CM

## 2020-10-29 DIAGNOSIS — S01502A Unspecified open wound of oral cavity, initial encounter: Secondary | ICD-10-CM | POA: Diagnosis not present

## 2020-10-29 DIAGNOSIS — M879 Osteonecrosis, unspecified: Secondary | ICD-10-CM | POA: Diagnosis not present

## 2020-11-08 ENCOUNTER — Inpatient Hospital Stay (HOSPITAL_BASED_OUTPATIENT_CLINIC_OR_DEPARTMENT_OTHER): Payer: Medicare Other | Admitting: Oncology

## 2020-11-08 ENCOUNTER — Telehealth: Payer: Medicare Other | Admitting: Plastic Surgery

## 2020-11-08 ENCOUNTER — Other Ambulatory Visit: Payer: Self-pay

## 2020-11-08 ENCOUNTER — Inpatient Hospital Stay (HOSPITAL_COMMUNITY): Payer: Medicare Other

## 2020-11-08 ENCOUNTER — Inpatient Hospital Stay (HOSPITAL_COMMUNITY): Payer: Medicare Other | Attending: Hematology

## 2020-11-08 VITALS — BP 131/55 | HR 67 | Temp 97.1°F | Resp 17 | Wt 168.2 lb

## 2020-11-08 DIAGNOSIS — E876 Hypokalemia: Secondary | ICD-10-CM

## 2020-11-08 DIAGNOSIS — C9000 Multiple myeloma not having achieved remission: Secondary | ICD-10-CM

## 2020-11-08 DIAGNOSIS — E538 Deficiency of other specified B group vitamins: Secondary | ICD-10-CM

## 2020-11-08 DIAGNOSIS — Z5112 Encounter for antineoplastic immunotherapy: Secondary | ICD-10-CM | POA: Diagnosis not present

## 2020-11-08 LAB — COMPREHENSIVE METABOLIC PANEL
ALT: 10 U/L (ref 0–44)
AST: 17 U/L (ref 15–41)
Albumin: 3.5 g/dL (ref 3.5–5.0)
Alkaline Phosphatase: 54 U/L (ref 38–126)
Anion gap: 12 (ref 5–15)
BUN: 12 mg/dL (ref 8–23)
CO2: 28 mmol/L (ref 22–32)
Calcium: 8.9 mg/dL (ref 8.9–10.3)
Chloride: 101 mmol/L (ref 98–111)
Creatinine, Ser: 1.09 mg/dL — ABNORMAL HIGH (ref 0.44–1.00)
GFR, Estimated: 51 mL/min — ABNORMAL LOW (ref >60)
Glucose, Bld: 129 mg/dL — ABNORMAL HIGH (ref 70–99)
Potassium: 2.4 mmol/L — CL (ref 3.5–5.1)
Sodium: 141 mmol/L (ref 135–145)
Total Bilirubin: 1 mg/dL (ref 0.3–1.2)
Total Protein: 6.5 g/dL (ref 6.5–8.1)

## 2020-11-08 LAB — CBC WITH DIFFERENTIAL/PLATELET
Abs Immature Granulocytes: 0 10*3/uL (ref 0.00–0.07)
Basophils Absolute: 0.1 10*3/uL (ref 0.0–0.1)
Basophils Relative: 2 %
Eosinophils Absolute: 0.2 10*3/uL (ref 0.0–0.5)
Eosinophils Relative: 7 %
HCT: 33 % — ABNORMAL LOW (ref 36.0–46.0)
Hemoglobin: 10.6 g/dL — ABNORMAL LOW (ref 12.0–15.0)
Immature Granulocytes: 0 %
Lymphocytes Relative: 25 %
Lymphs Abs: 0.7 10*3/uL (ref 0.7–4.0)
MCH: 32.9 pg (ref 26.0–34.0)
MCHC: 32.1 g/dL (ref 30.0–36.0)
MCV: 102.5 fL — ABNORMAL HIGH (ref 80.0–100.0)
Monocytes Absolute: 0.3 10*3/uL (ref 0.1–1.0)
Monocytes Relative: 10 %
Neutro Abs: 1.6 10*3/uL — ABNORMAL LOW (ref 1.7–7.7)
Neutrophils Relative %: 56 %
Platelets: 208 10*3/uL (ref 150–400)
RBC: 3.22 MIL/uL — ABNORMAL LOW (ref 3.87–5.11)
RDW: 14.8 % (ref 11.5–15.5)
WBC: 2.9 10*3/uL — ABNORMAL LOW (ref 4.0–10.5)
nRBC: 0 % (ref 0.0–0.2)

## 2020-11-08 LAB — LACTATE DEHYDROGENASE: LDH: 145 U/L (ref 98–192)

## 2020-11-08 MED ORDER — POTASSIUM CHLORIDE CRYS ER 20 MEQ PO TBCR
40.0000 meq | EXTENDED_RELEASE_TABLET | Freq: Once | ORAL | Status: AC
  Administered 2020-11-08: 40 meq via ORAL
  Filled 2020-11-08: qty 2

## 2020-11-08 MED ORDER — BORTEZOMIB CHEMO SQ INJECTION 3.5 MG (2.5MG/ML)
1.3000 mg/m2 | Freq: Once | INTRAMUSCULAR | Status: AC
  Administered 2020-11-08: 2.25 mg via SUBCUTANEOUS
  Filled 2020-11-08: qty 0.9

## 2020-11-08 MED ORDER — EPOETIN ALFA-EPBX 20000 UNIT/ML IJ SOLN
INTRAMUSCULAR | Status: AC
  Filled 2020-11-08: qty 1

## 2020-11-08 MED ORDER — PROCHLORPERAZINE MALEATE 10 MG PO TABS
ORAL_TABLET | ORAL | Status: AC
  Filled 2020-11-08: qty 1

## 2020-11-08 MED ORDER — PROCHLORPERAZINE MALEATE 10 MG PO TABS
10.0000 mg | ORAL_TABLET | Freq: Once | ORAL | Status: AC
  Administered 2020-11-08: 10 mg via ORAL

## 2020-11-08 MED ORDER — CYANOCOBALAMIN 1000 MCG/ML IJ SOLN
1000.0000 ug | Freq: Once | INTRAMUSCULAR | Status: AC
  Administered 2020-11-08: 1000 ug via INTRAMUSCULAR

## 2020-11-08 MED ORDER — DEXAMETHASONE 4 MG PO TABS
40.0000 mg | ORAL_TABLET | Freq: Once | ORAL | Status: AC
  Administered 2020-11-08: 40 mg via ORAL

## 2020-11-08 MED ORDER — CYANOCOBALAMIN 1000 MCG/ML IJ SOLN
INTRAMUSCULAR | Status: AC
  Filled 2020-11-08: qty 1

## 2020-11-08 MED ORDER — EPOETIN ALFA-EPBX 10000 UNIT/ML IJ SOLN: 20000.0000 [IU] | Freq: Once | INTRAMUSCULAR | Status: DC

## 2020-11-08 MED ORDER — DEXAMETHASONE 4 MG PO TABS
ORAL_TABLET | ORAL | Status: AC
  Filled 2020-11-08: qty 10

## 2020-11-08 NOTE — Patient Instructions (Signed)
[facility] Discharge Instructions for Patients Receiving Chemotherapy  Today you received the following chemotherapy agents: Velcade  To help prevent nausea and vomiting after your treatment, we encourage you to take your nausea medication.   If you develop nausea and vomiting that is not controlled by your nausea medication, call the clinic.   BELOW ARE SYMPTOMS THAT SHOULD BE REPORTED IMMEDIATELY:  *FEVER GREATER THAN 100.5 F  *CHILLS WITH OR WITHOUT FEVER  NAUSEA AND VOMITING THAT IS NOT CONTROLLED WITH YOUR NAUSEA MEDICATION  *UNUSUAL SHORTNESS OF BREATH  *UNUSUAL BRUISING OR BLEEDING  TENDERNESS IN MOUTH AND THROAT WITH OR WITHOUT PRESENCE OF ULCERS  *URINARY PROBLEMS  *BOWEL PROBLEMS  UNUSUAL RASH Items with * indicate a potential emergency and should be followed up as soon as possible.  Feel free to call the clinic should you have any questions or concerns. The clinic phone number is [phone].  Please show the [facility] at check-in to the Emergency Department and triage nurse.

## 2020-11-08 NOTE — Progress Notes (Signed)
Treatment given today per MD orders.  Tolerated infusion without adverse affects.  Vital signs stable.  No complaints at this time.  Patient instructed to take Potassium and other home medications as ordered.  Discharge from clinic ambulatory in stable condition.  Alert and oriented X 3.  Follow up with [facility] as scheduled.

## 2020-11-08 NOTE — Progress Notes (Signed)
Patient seen by NP, JBurns. Proceed with treatment verbal order received. Give PO 40 mEq of Potassium today. Labs reviewed. Potassium 2.4.

## 2020-11-08 NOTE — Progress Notes (Signed)
[place], [place] [address]   CLINIC:  Medical Oncology/Hematology  PCP:  [name], MD [address] [place] / [place] [address] [phone]   REASON FOR VISIT:  Follow-up for multiple myeloma  PRIOR THERAPY: None  NGS Results: Not done  CURRENT THERAPY: RVD  BRIEF ONCOLOGIC HISTORY:  Oncology History  Multiple myeloma not having achieved remission ([place])  01/20/2018 Initial Diagnosis   Multiple myeloma not having achieved remission ([place])   01/26/2018 -  Chemotherapy   The patient had dexamethasone (DECADRON) tablet 40 mg, 40 mg (100 % of original dose 40 mg), Oral,  Once, 19 of 20 cycles Dose modification: 40 mg (original dose 40 mg, Cycle 17), 40 mg (original dose 40 mg, Cycle 20) Administration: 40 mg (04/24/2019), 40 mg (05/23/2019), 40 mg (06/20/2019), 40 mg (07/11/2019), 40 mg (07/18/2019), 40 mg (07/25/2019), 40 mg (08/08/2019), 40 mg (08/15/2019), 40 mg (08/22/2019), 40 mg (09/05/2019), 40 mg (09/12/2019), 40 mg (09/19/2019), 40 mg (10/03/2019), 40 mg (10/10/2019), 40 mg (10/17/2019), 40 mg (10/31/2019), 40 mg (11/07/2019), 40 mg (11/14/2019), 40 mg (11/29/2019), 40 mg (12/06/2019), 40 mg (12/13/2019), 40 mg (01/03/2020), 40 mg (01/10/2020), 40 mg (01/17/2020), 40 mg (01/31/2020), 40 mg (02/07/2020), 40 mg (02/14/2020), 40 mg (04/17/2020), 40 mg (05/21/2020), 40 mg (06/04/2020), 40 mg (06/19/2020), 40 mg (06/26/2020), 40 mg (07/03/2020), 40 mg (07/17/2020), 40 mg (07/24/2020), 40 mg (07/31/2020), 40 mg (08/14/2020), 40 mg (08/21/2020), 40 mg (08/28/2020), 40 mg (09/11/2020), 40 mg (09/18/2020), 40 mg (09/30/2020), 40 mg (10/14/2020), 40 mg (10/21/2020), 40 mg (11/08/2020) dexamethasone (DECADRON) 4 MG tablet, 1 of 1 cycle, Start date: 04/24/2019, End date: -- lenalidomide (REVLIMID) 20 MG capsule, 1 of 1 cycle, Start date: 10/04/2020, End date: -- bortezomib SQ (VELCADE) chemo injection 2.5 mg, 1.3 mg/m2 = 2.5 mg, Subcutaneous,  Once, 35 of 36 cycles Administration: 2.5 mg  (01/26/2018), 2.5 mg (02/02/2018), 2.5 mg (02/09/2018), 2.5 mg (02/16/2018), 2.5 mg (02/23/2018), 2.5 mg (03/02/2018), 2.5 mg (03/09/2018), 2.5 mg (03/30/2018), 2.5 mg (04/06/2018), 2.5 mg (04/13/2018), 2.5 mg (04/21/2018), 2.5 mg (05/06/2018), 2.5 mg (05/11/2018), 2.5 mg (05/20/2018), 2.5 mg (05/27/2018), 2.5 mg (06/10/2018), 2.5 mg (06/17/2018), 2.5 mg (06/24/2018), 2.5 mg (07/08/2018), 2.5 mg (07/15/2018), 2.5 mg (07/22/2018), 2.5 mg (08/05/2018), 2.5 mg (08/12/2018), 2.5 mg (09/02/2018), 2.5 mg (09/09/2018), 2.5 mg (09/16/2018), 2.5 mg (09/30/2018), 2.5 mg (10/07/2018), 2.5 mg (10/14/2018), 2.5 mg (10/31/2018), 2.5 mg (11/07/2018), 2.5 mg (11/14/2018), 2.5 mg (11/28/2018), 2.25 mg (12/05/2018), 2.25 mg (12/12/2018), 2.25 mg (12/26/2018), 2.25 mg (01/02/2019), 2.25 mg (01/09/2019), 2.25 mg (01/23/2019), 2.25 mg (01/30/2019), 2.25 mg (02/06/2019), 2.25 mg (02/20/2019), 2.25 mg (02/27/2019), 2.25 mg (03/06/2019), 2.25 mg (03/20/2019), 2.25 mg (03/27/2019), 2.25 mg (04/03/2019), 2.25 mg (04/17/2019), 2.25 mg (04/24/2019), 2.25 mg (05/01/2019), 2.25 mg (05/15/2019), 2.25 mg (05/23/2019), 2.25 mg (05/30/2019), 2.25 mg (06/13/2019), 2.25 mg (06/20/2019), 2.25 mg (06/27/2019), 2.25 mg (07/11/2019), 2.25 mg (07/18/2019), 2.25 mg (07/25/2019), 2.25 mg (08/08/2019), 2.25 mg (08/15/2019), 2.25 mg (08/22/2019), 2.25 mg (09/05/2019), 2.25 mg (09/12/2019), 2.25 mg (09/19/2019), 2.25 mg (10/03/2019), 2.25 mg (10/10/2019), 2.25 mg (10/17/2019), 2.25 mg (10/31/2019), 2.25 mg (11/07/2019), 2.25 mg (11/14/2019), 2.25 mg (11/29/2019), 2.25 mg (12/06/2019), 2.25 mg (12/13/2019), 2.25 mg (01/03/2020), 2.25 mg (01/10/2020), 2.25 mg (01/17/2020), 2.25 mg (01/31/2020), 2.25 mg (02/07/2020), 2.25 mg (02/14/2020), 2.25 mg (04/17/2020), 2.25 mg (05/21/2020), 2.25 mg (05/28/2020), 2.25 mg (06/04/2020), 2.25 mg (06/19/2020), 2.25 mg (06/26/2020), 2.25 mg (07/03/2020), 2.25 mg (07/17/2020), 2.25 mg (07/24/2020), 2.25 mg (07/31/2020), 2.25 mg (08/14/2020), 2.25 mg (08/21/2020), 2.25 mg (  08/28/2020), 2.25 mg (09/11/2020), 2.25 mg (09/18/2020),  2.25 mg (09/30/2020), 2.25 mg (10/14/2020), 2.25 mg (10/21/2020), 2.25 mg (11/08/2020)  for chemotherapy treatment.      CANCER STAGING: Cancer Staging No matching staging information was found for the patient.  INTERVAL HISTORY:  Ms. JUSTUS DUERR, a 79 y.o. female, returns for routine follow-up and consideration for next cycle of chemotherapy. Nakia was last seen on 08/21/2020.  Due for day #15 of cycle #35 of bortezomib today.   Today she is accompanied by her sister. Overall, she tells me she has been feeling pretty well.  Patient sister states she has 2 to 3 days left of her Revlimid  and then she will be on her week off.  She denies any concerns.  Her appetite is 100%.  Her energy levels are 100%.  She has no pain.    Overall, she feels ready for next Velcade injection.  REVIEW OF SYSTEMS:  Review of Systems  Constitutional: Positive for fatigue. Negative for appetite change and unexpected weight change.  Gastrointestinal: Negative for diarrhea and nausea.  Neurological: Negative for numbness.  All other systems reviewed and are negative.   PAST MEDICAL/SURGICAL HISTORY:  Past Medical History:  Diagnosis Date  . Breast cancer ([place])    left breast/ 2008/ surg/ rad tx  . Coronary artery disease   . Diabetes mellitus    Past Surgical History:  Procedure Laterality Date  . ABDOMINAL HYSTERECTOMY    . BREAST SURGERY    . DEBRIDEMENT MANDIBLE N/A 02/22/2020   Procedure: INCISION AND DRAINAGE WITH DEBRIDEMENT MANDIBLE;  Surgeon: Michael Litter, DMD;  Location: [facility] ORS;  Service: Oral Surgery;  Laterality: N/A;  . TOOTH EXTRACTION N/A 02/22/2020   Procedure: DENTAL RESTORATION/EXTRACTIONS;  Surgeon: Michael Litter, DMD;  Location: [facility];  Service: Oral Surgery;  Laterality: N/A;  DENTAL KIT REQUESTED    SOCIAL HISTORY:  Social History   Socioeconomic History  . Marital status: Divorced    Spouse name: Not on file  . Number of children: Not on file  . Years of  education: Not on file  . Highest education level: Not on file  Occupational History  . Not on file  Tobacco Use  . Smoking status: Never Smoker  . Smokeless tobacco: Never Used  Vaping Use  . Vaping Use: Never used  Substance and Sexual Activity  . Alcohol use: No  . Drug use: No  . Sexual activity: Yes    Birth control/protection: Surgical  Other Topics Concern  . Not on file  Social History Narrative  . Not on file   Social Determinants of Health   Financial Resource Strain:   . Difficulty of Paying Living Expenses: Not on file  Food Insecurity:   . Worried About Charity fundraiser in the Last Year: Not on file  . Ran Out of Food in the Last Year: Not on file  Transportation Needs:   . Lack of Transportation (Medical): Not on file  . Lack of Transportation (Non-Medical): Not on file  Physical Activity:   . Days of Exercise per Week: Not on file  . Minutes of Exercise per Session: Not on file  Stress:   . Feeling of Stress : Not on file  Social Connections:   . Frequency of Communication with Friends and Family: Not on file  . Frequency of Social Gatherings with Friends and Family: Not on file  . Attends Religious Services: Not on file  . Active Member of Clubs or  Organizations: Not on file  . Attends Archivist Meetings: Not on file  . Marital Status: Not on file  Intimate Partner Violence:   . Fear of Current or Ex-Partner: Not on file  . Emotionally Abused: Not on file  . Physically Abused: Not on file  . Sexually Abused: Not on file    FAMILY HISTORY:  Family History  Problem Relation Age of Onset  . Obesity Sister     CURRENT MEDICATIONS:  Current Outpatient Medications  Medication Sig Dispense Refill  . acyclovir (ZOVIRAX) 400 MG tablet TAKE 1 TABLET BY MOUTH TWICE DAILY 60 tablet 11  . aspirin 81 MG tablet Take 81 mg by mouth daily.      . bortezomib IV (VELCADE) 3.5 MG injection Inject 3.5 mg into the vein once a week. weekly     .  chlorhexidine (PERIDEX) 0.12 % solution Use as directed 15 mLs in the mouth or throat 2 (two) times daily.     [place] Kitchen dexamethasone (DECADRON) 4 MG tablet Take 10 tablets (40 mg) on days 1, 8, and 15 of chemo. Repeat every 21 days. 30 tablet 3  . glipiZIDE (GLUCOTROL) 5 MG tablet Take 5 mg by mouth daily before breakfast.     . ketoconazole (NIZORAL) 2 % cream Apply topically.    [place] Kitchen lenalidomide (REVLIMID) 20 MG capsule Take 1 capsule by mouth once daily for 14 days on, and 7 days off of a 21 day cycle. 14 capsule 0  . lisinopril-hydrochlorothiazide (PRINZIDE,ZESTORETIC) 20-25 MG tablet Take 1 tablet by mouth every morning.     . magnesium oxide (MAG-OX) 400 (241.3 Mg) MG tablet Take 1 tablet (400 mg total) by mouth in the morning, at noon, and at bedtime. 90 tablet 6  . metFORMIN (GLUCOPHAGE) 1000 MG tablet Take 1,000 mg by mouth 2 times daily at 12 noon and 4 pm.      . ofloxacin (OCUFLOX) 0.3 % ophthalmic solution Place 1 drop into both eyes.     . potassium chloride SA (KLOR-CON) 20 MEQ tablet TAKE (2) TABLETS BY MOUTH THREE TIMES DAILY. 168 tablet 11  . prednisoLONE acetate (PRED FORTE) 1 % ophthalmic suspension Place 1 drop into both eyes.      No current facility-administered medications for this visit.    ALLERGIES:  Allergies  Allergen Reactions  . Motrin [Ibuprofen] Rash    PHYSICAL EXAM:  Performance status (ECOG): 1 - Symptomatic but completely ambulatory  Vitals:   11/08/20 0836  BP: (!) 131/55  Pulse: 67  Resp: 17  Temp: (!) 97.1 F (36.2 C)  SpO2: 98%   Wt Readings from Last 3 Encounters:  11/08/20 168 lb 3.2 oz (76.3 kg)  10/29/20 167 lb 6.4 oz (75.9 kg)  10/14/20 168 lb 3.2 oz (76.3 kg)   Physical Exam Vitals reviewed.  Constitutional:      Appearance: Normal appearance.  Musculoskeletal:     Right lower leg: No edema.     Left lower leg: No edema.  Neurological:     General: No focal deficit present.     Mental Status: She is alert and oriented to person,  place, and time.  Psychiatric:        Mood and Affect: Mood normal.        Behavior: Behavior normal.      LABORATORY DATA:  I have reviewed the labs as listed.  CBC Latest Ref Rng & Units 11/08/2020 10/21/2020 10/14/2020  WBC 4.0 - 10.5 K/uL 2.9(L) 3.3(L) 4.0  Hemoglobin 12.0 - 15.0 g/dL 10.6(L) 10.7(L) 9.8(L)  Hematocrit 36 - 46 % 33.0(L) 33.6(L) 30.0(L)  Platelets 150 - 400 K/uL 208 185 199   CMP Latest Ref Rng & Units 11/08/2020 10/21/2020 10/14/2020  Glucose 70 - 99 mg/dL 129(H) 165(H) 160(H)  BUN 8 - 23 mg/dL '12 11 20  ' Creatinine 0.44 - 1.00 mg/dL 1.09(H) 1.08(H) 1.06(H)  Sodium 135 - 145 mmol/L 141 138 139  Potassium 3.5 - 5.1 mmol/L 2.4(LL) 3.0(L) 3.4(L)  Chloride 98 - 111 mmol/L 101 102 104  CO2 22 - 32 mmol/L '28 28 25  ' Calcium 8.9 - 10.3 mg/dL 8.9 9.3 8.7(L)  Total Protein 6.5 - 8.1 g/dL 6.5 6.5 6.9  Total Bilirubin 0.3 - 1.2 mg/dL 1.0 0.5 0.7  Alkaline Phos 38 - 126 U/L 54 59 55  AST 15 - 41 U/L 17 10(L) 10(L)  ALT 0 - 44 U/L '10 10 7   ' Lab Results  Component Value Date   LDH 145 11/08/2020   LDH 107 09/18/2020   LDH 136 08/28/2020   Lab Results  Component Value Date   TOTALPROTELP 6.3 09/18/2020   TOTALPROTELP 6.2 09/18/2020   ALBUMINELP 3.4 09/18/2020   A1GS 0.2 09/18/2020   A2GS 0.8 09/18/2020   BETS 1.0 09/18/2020   GAMS 0.9 09/18/2020   MSPIKE 0.5 (H) 09/18/2020   SPEI Comment 09/18/2020   Lab Results  Component Value Date   KPAFRELGTCHN 39.1 (H) 09/18/2020   LAMBDASER 20.3 09/18/2020   KAPLAMBRATIO 1.93 (H) 09/18/2020    DIAGNOSTIC IMAGING:  I have independently reviewed the scans and discussed with the patient. No results found.   ASSESSMENT:  1. IgA kappa plasma cell myeloma, stage I: -RVD started on 01/09/2018, held since 02/14/2020 due to mandible abscess. -Myeloma labs on 05/14/2020 showed progression with M spike of 0.5 g. -RVD started back on 05/21/2020. -Myeloma labs on 07/10/2020 shows M spike improved to 0.3 g from 0.6 g  previously.  Free light chain ratio is 1.74 with kappa light chains 28.4. -Myeloma panel from 09/18/2020 shows M spike 0.5 g.  Kappa light chains are 39.1 and ratio is 1.9.  2. Osteomyelitis of the right mandible/dental abscess: -Finished IV ceftriaxone on 04/03/2020. Finished oral antibiotics. -We will hold Xgeva indefinitely.   PLAN:  1. IgA kappa plasma cell myeloma, stage I: -She is tolerating Revlimid and Velcade 3 weeks on 1 week off very well. -Labs today show hemoglobin of 10.6, white blood cell count of 2.9, ANC of 1.6, potassium level of 2.4 and stable kidney function. -Although she is slightly neutropenic, she is also at the end of her cycle.  Anticipate improvement of lab work prior to initiation of cycle 36.  -Continue Revlimid-has about 2 to 3 days left.  Spoke to daughter who is aware she will then take 1 week off before restarting. -She will proceed with her next cycle of Velcade today. -I plan to see her back in 4 weeks with repeat myeloma labs.  2. Severe hypokalemia: -Potassium is 2.4.  Requested that patient get IV potassium while in clinic.  Unfortunately, they were unable to stay secondary to not having a ride.  She will get 40 mEq p.o. while in clinic and she was instructed to take 48 mEq additional at lunchtime and 20 mEq at bedtime for a total of 100 mEq for today and to restart her potassium pills at 40 mEq 3 times daily.  Instructions were written out for her. -She is occasionally noncompliant.  3. Osteomyelitis  of the right mandible/dental abscess: -Bisphosphonate on hold indefinitely.  4. Hypomagnesemia: -Magnesium is 1.7.  Continue 3 times daily.  5. 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 -Continue Retacrit as needed.  Hemoglobin today is 10.6.  No Retacrit today.   Orders placed this encounter:  No orders of the defined types were placed in this encounter.  Greater than 50% was spent in counseling and  coordination of care with this patient including but not limited to discussion of the relevant topics above (See A&P) including, but not limited to diagnosis and management of acute and chronic medical conditions.   Faythe Casa, NP 11/08/2020 2:19 PM  [place] [phone]

## 2020-11-08 NOTE — Progress Notes (Signed)
Patient is taking Revlimid as prescribed and denies any side effects.

## 2020-11-08 NOTE — Progress Notes (Signed)
CRITICAL VALUE ALERT  Critical Value:  K+ 2.4  Date & Time Notied:  11/08/2020 at 0900  Provider Notified: Lorretta Harp, NP  Orders Received/Actions taken: KCl 20 mEq IV and K-Dur 40 mEq pre and post infusion.  Pt is refusing to stay for KCl IV; orders rec'd for K-Dur 40 mEq x 1 dose in clinic, and to instruct pt to take 40 mEq of her own supply 3 hours from now, and then 20 mEq at bedtime tonight.

## 2020-11-11 LAB — PROTEIN ELECTROPHORESIS, SERUM
A/G Ratio: 1.1 (ref 0.7–1.7)
Albumin ELP: 3.2 g/dL (ref 2.9–4.4)
Alpha-1-Globulin: 0.2 g/dL (ref 0.0–0.4)
Alpha-2-Globulin: 0.8 g/dL (ref 0.4–1.0)
Beta Globulin: 1.1 g/dL (ref 0.7–1.3)
Gamma Globulin: 0.7 g/dL (ref 0.4–1.8)
Globulin, Total: 2.8 g/dL (ref 2.2–3.9)
Total Protein ELP: 6 g/dL (ref 6.0–8.5)

## 2020-11-11 LAB — KAPPA/LAMBDA LIGHT CHAINS
Kappa free light chain: 33.5 mg/L — ABNORMAL HIGH (ref 3.3–19.4)
Kappa, lambda light chain ratio: 1.83 — ABNORMAL HIGH (ref 0.26–1.65)
Lambda free light chains: 18.3 mg/L (ref 5.7–26.3)

## 2020-11-12 ENCOUNTER — Telehealth: Payer: Medicare Other | Admitting: Plastic Surgery

## 2020-11-12 ENCOUNTER — Telehealth (INDEPENDENT_AMBULATORY_CARE_PROVIDER_SITE_OTHER): Payer: Medicare Other | Admitting: Plastic Surgery

## 2020-11-12 ENCOUNTER — Other Ambulatory Visit: Payer: Self-pay

## 2020-11-12 DIAGNOSIS — S01502A Unspecified open wound of oral cavity, initial encounter: Secondary | ICD-10-CM

## 2020-11-15 ENCOUNTER — Encounter: Payer: Self-pay | Admitting: Plastic Surgery

## 2020-11-15 DIAGNOSIS — S01502A Unspecified open wound of oral cavity, initial encounter: Secondary | ICD-10-CM | POA: Insufficient documentation

## 2020-11-15 NOTE — Progress Notes (Signed)
   Patient ID: Brittany Archer, female    DOB: 10/27/1941, 79 y.o.   MRN: [id number]   Chief Complaint  Patient presents with  . Advice Only    The patient is an 79-year-old female here with family for evaluation of a wound in her mouth.  The patient had some issues with her teeth that got infected.  The teeth were removed.  She now has a 3 to 4 cm area of bone exposed on the right mandible.  I do not see infection although there could certainly be a deep infection in the bone.  The soft tissue looks stable.  She has no concerning cellulitis of her cheek or face.  She has multiple medical conditions and is currently under the care of an oncologist for her myeloma.  I am sure that this is contributing to her poor healing.  She was sent by Dr. Drab.   Review of Systems  Constitutional: Negative.   HENT: Positive for dental problem.   Eyes: Negative.   Respiratory: Negative.  Negative for chest tightness.   Cardiovascular: Negative for leg swelling.  Gastrointestinal: Negative for abdominal distention.  Endocrine: Negative.   Neurological: Negative.     Past Medical History:  Diagnosis Date  . Breast cancer (HCC)    left breast/ 2008/ surg/ rad tx  . Coronary artery disease   . Diabetes mellitus     Past Surgical History:  Procedure Laterality Date  . ABDOMINAL HYSTERECTOMY    . BREAST SURGERY    . DEBRIDEMENT MANDIBLE N/A 02/22/2020   Procedure: INCISION AND DRAINAGE WITH DEBRIDEMENT MANDIBLE;  Surgeon: Drab, Justin, DMD;  Location: [facility];  Service: Oral Surgery;  Laterality: N/A;  . TOOTH EXTRACTION N/A 02/22/2020   Procedure: DENTAL RESTORATION/EXTRACTIONS;  Surgeon: Drab, Justin, DMD;  Location: [facility];  Service: Oral Surgery;  Laterality: N/A;  DENTAL KIT REQUESTED      Current Outpatient Medications:  .  acyclovir (ZOVIRAX) 400 MG tablet, TAKE 1 TABLET BY MOUTH TWICE DAILY, Disp: 60 tablet, Rfl: 11 .  aspirin 81 MG tablet, Take 81 mg by mouth daily.  , Disp: , Rfl:  .   bortezomib IV (VELCADE) 3.5 MG injection, Inject 3.5 mg into the vein once a week. weekly , Disp: , Rfl:  .  chlorhexidine (PERIDEX) 0.12 % solution, Use as directed 15 mLs in the mouth or throat 2 (two) times daily. , Disp: , Rfl:  .  dexamethasone (DECADRON) 4 MG tablet, Take 10 tablets (40 mg) on days 1, 8, and 15 of chemo. Repeat every 21 days., Disp: 30 tablet, Rfl: 3 .  glipiZIDE (GLUCOTROL) 5 MG tablet, Take 5 mg by mouth daily before breakfast. , Disp: , Rfl:  .  ketoconazole (NIZORAL) 2 % cream, Apply topically., Disp: , Rfl:  .  lenalidomide (REVLIMID) 20 MG capsule, Take 1 capsule by mouth once daily for 14 days on, and 7 days off of a 21 day cycle., Disp: 14 capsule, Rfl: 0 .  lisinopril-hydrochlorothiazide (PRINZIDE,ZESTORETIC) 20-25 MG tablet, Take 1 tablet by mouth every morning. , Disp: , Rfl:  .  magnesium oxide (MAG-OX) 400 (241.3 Mg) MG tablet, Take 1 tablet (400 mg total) by mouth in the morning, at noon, and at bedtime., Disp: 90 tablet, Rfl: 6 .  metFORMIN (GLUCOPHAGE) 1000 MG tablet, Take 1,000 mg by mouth 2 times daily at 12 noon and 4 pm.  , Disp: , Rfl:  .  ofloxacin (OCUFLOX) 0.3 % ophthalmic solution, Place   1 drop into both eyes. , Disp: , Rfl:  .  potassium chloride SA (KLOR-CON) 20 MEQ tablet, TAKE (2) TABLETS BY MOUTH THREE TIMES DAILY., Disp: 168 tablet, Rfl: 11 .  prednisoLONE acetate (PRED FORTE) 1 % ophthalmic suspension, Place 1 drop into both eyes. , Disp: , Rfl:    Objective:   Vitals:   10/29/20 1442  BP: (!) 171/66  Pulse: 62  Temp: 98.7 F (37.1 C)  SpO2: 98%    Physical Exam Vitals and nursing note reviewed.  Constitutional:      Appearance: Normal appearance.  HENT:     Head: Normocephalic.  Cardiovascular:     Rate and Rhythm: Normal rate.     Pulses: Normal pulses.  Pulmonary:     Effort: Pulmonary effort is normal.  Neurological:     General: No focal deficit present.     Mental Status: She is alert and oriented to person, place, and  time.  Psychiatric:        Mood and Affect: Mood normal.        Behavior: Behavior normal.        Thought Content: Thought content normal.     Assessment & Plan:  Multiple myeloma not having achieved remission (HCC)  Osteonecrosis (HCC)  Type 2 diabetes mellitus with other skin complication, without long-term current use of insulin (HCC)  Open wound of mouth, initial encounter  I have discussed the information with Dr. [name] as follows: Recommendation is for achieving punctate bleeding of the mandible with possible placement of ACell to allow the gingiva to granulate over the phone.  If there is meant she may be a candidate for a cheek flap.  This is a very situation.  She will need to maximize nutritional status for help in achieving healing.  Have spoken with the patient and she is aware that Dr. [name] will be calling her  Wallace Going, DO

## 2020-11-25 ENCOUNTER — Inpatient Hospital Stay (HOSPITAL_COMMUNITY): Payer: Medicare Other

## 2020-11-25 ENCOUNTER — Encounter (HOSPITAL_COMMUNITY): Payer: Self-pay

## 2020-11-25 ENCOUNTER — Other Ambulatory Visit: Payer: Self-pay

## 2020-11-25 VITALS — BP 157/65 | HR 73 | Temp 97.0°F | Resp 18 | Wt 166.0 lb

## 2020-11-25 DIAGNOSIS — C9000 Multiple myeloma not having achieved remission: Secondary | ICD-10-CM

## 2020-11-25 DIAGNOSIS — Z5112 Encounter for antineoplastic immunotherapy: Secondary | ICD-10-CM | POA: Diagnosis not present

## 2020-11-25 LAB — COMPREHENSIVE METABOLIC PANEL
ALT: 9 U/L (ref 0–44)
AST: 11 U/L — ABNORMAL LOW (ref 15–41)
Albumin: 3.6 g/dL (ref 3.5–5.0)
Alkaline Phosphatase: 63 U/L (ref 38–126)
Anion gap: 9 (ref 5–15)
BUN: 27 mg/dL — ABNORMAL HIGH (ref 8–23)
CO2: 28 mmol/L (ref 22–32)
Calcium: 10.1 mg/dL (ref 8.9–10.3)
Chloride: 102 mmol/L (ref 98–111)
Creatinine, Ser: 1.1 mg/dL — ABNORMAL HIGH (ref 0.44–1.00)
GFR, Estimated: 51 mL/min — ABNORMAL LOW (ref >60)
Glucose, Bld: 108 mg/dL — ABNORMAL HIGH (ref 70–99)
Potassium: 3.5 mmol/L (ref 3.5–5.1)
Sodium: 139 mmol/L (ref 135–145)
Total Bilirubin: 0.6 mg/dL (ref 0.3–1.2)
Total Protein: 6.9 g/dL (ref 6.5–8.1)

## 2020-11-25 LAB — CBC WITH DIFFERENTIAL/PLATELET
Abs Immature Granulocytes: 0 10*3/uL (ref 0.00–0.07)
Basophils Absolute: 0.1 10*3/uL (ref 0.0–0.1)
Basophils Relative: 1 %
Eosinophils Absolute: 0.1 10*3/uL (ref 0.0–0.5)
Eosinophils Relative: 2 %
HCT: 33.5 % — ABNORMAL LOW (ref 36.0–46.0)
Hemoglobin: 10.9 g/dL — ABNORMAL LOW (ref 12.0–15.0)
Immature Granulocytes: 0 %
Lymphocytes Relative: 22 %
Lymphs Abs: 0.9 10*3/uL (ref 0.7–4.0)
MCH: 32.8 pg (ref 26.0–34.0)
MCHC: 32.5 g/dL (ref 30.0–36.0)
MCV: 100.9 fL — ABNORMAL HIGH (ref 80.0–100.0)
Monocytes Absolute: 0.3 10*3/uL (ref 0.1–1.0)
Monocytes Relative: 7 %
Neutro Abs: 2.7 10*3/uL (ref 1.7–7.7)
Neutrophils Relative %: 68 %
Platelets: 181 10*3/uL (ref 150–400)
RBC: 3.32 MIL/uL — ABNORMAL LOW (ref 3.87–5.11)
RDW: 14.9 % (ref 11.5–15.5)
WBC: 4 10*3/uL (ref 4.0–10.5)
nRBC: 0 % (ref 0.0–0.2)

## 2020-11-25 MED ORDER — PROCHLORPERAZINE MALEATE 10 MG PO TABS
10.0000 mg | ORAL_TABLET | Freq: Once | ORAL | Status: AC
  Administered 2020-11-25: 10 mg via ORAL
  Filled 2020-11-25: qty 1

## 2020-11-25 MED ORDER — DEXAMETHASONE 4 MG PO TABS
40.0000 mg | ORAL_TABLET | Freq: Once | ORAL | Status: AC
  Administered 2020-11-25: 40 mg via ORAL
  Filled 2020-11-25: qty 10

## 2020-11-25 MED ORDER — BORTEZOMIB CHEMO SQ INJECTION 3.5 MG (2.5MG/ML)
1.3000 mg/m2 | Freq: Once | INTRAMUSCULAR | Status: AC
  Administered 2020-11-25: 2.25 mg via SUBCUTANEOUS
  Filled 2020-11-25: qty 0.9

## 2020-11-25 NOTE — Progress Notes (Signed)
Patient tolerated Velcade injection with no complaints voiced.  Lab work reviewed.  See MAR for details.  Injection site clean and dry with no bruising or swelling noted.  Band aid applied.  VSS.  Patient left in satisfactory condition with no s/s of distress noted.  

## 2020-12-02 ENCOUNTER — Inpatient Hospital Stay (HOSPITAL_COMMUNITY): Payer: Medicare Other

## 2020-12-02 ENCOUNTER — Other Ambulatory Visit: Payer: Self-pay

## 2020-12-02 ENCOUNTER — Inpatient Hospital Stay (HOSPITAL_COMMUNITY): Payer: Medicare Other | Attending: Hematology

## 2020-12-02 ENCOUNTER — Encounter (HOSPITAL_COMMUNITY): Payer: Self-pay

## 2020-12-02 VITALS — BP 141/63 | HR 65 | Temp 97.3°F | Resp 18 | Wt 166.8 lb

## 2020-12-02 DIAGNOSIS — E538 Deficiency of other specified B group vitamins: Secondary | ICD-10-CM | POA: Diagnosis not present

## 2020-12-02 DIAGNOSIS — E876 Hypokalemia: Secondary | ICD-10-CM | POA: Insufficient documentation

## 2020-12-02 DIAGNOSIS — C9000 Multiple myeloma not having achieved remission: Secondary | ICD-10-CM

## 2020-12-02 DIAGNOSIS — N189 Chronic kidney disease, unspecified: Secondary | ICD-10-CM | POA: Insufficient documentation

## 2020-12-02 DIAGNOSIS — D631 Anemia in chronic kidney disease: Secondary | ICD-10-CM | POA: Insufficient documentation

## 2020-12-02 DIAGNOSIS — Z5112 Encounter for antineoplastic immunotherapy: Secondary | ICD-10-CM | POA: Diagnosis present

## 2020-12-02 LAB — COMPREHENSIVE METABOLIC PANEL
ALT: 9 U/L (ref 0–44)
AST: 11 U/L — ABNORMAL LOW (ref 15–41)
Albumin: 3.5 g/dL (ref 3.5–5.0)
Alkaline Phosphatase: 61 U/L (ref 38–126)
Anion gap: 9 (ref 5–15)
BUN: 25 mg/dL — ABNORMAL HIGH (ref 8–23)
CO2: 27 mmol/L (ref 22–32)
Calcium: 9.3 mg/dL (ref 8.9–10.3)
Chloride: 102 mmol/L (ref 98–111)
Creatinine, Ser: 1.05 mg/dL — ABNORMAL HIGH (ref 0.44–1.00)
GFR, Estimated: 54 mL/min — ABNORMAL LOW (ref >60)
Glucose, Bld: 114 mg/dL — ABNORMAL HIGH (ref 70–99)
Potassium: 3.2 mmol/L — ABNORMAL LOW (ref 3.5–5.1)
Sodium: 138 mmol/L (ref 135–145)
Total Bilirubin: 0.7 mg/dL (ref 0.3–1.2)
Total Protein: 6.4 g/dL — ABNORMAL LOW (ref 6.5–8.1)

## 2020-12-02 LAB — CBC WITH DIFFERENTIAL/PLATELET
Abs Immature Granulocytes: 0.01 10*3/uL (ref 0.00–0.07)
Basophils Absolute: 0 10*3/uL (ref 0.0–0.1)
Basophils Relative: 0 %
Eosinophils Absolute: 0.1 10*3/uL (ref 0.0–0.5)
Eosinophils Relative: 2 %
HCT: 32.5 % — ABNORMAL LOW (ref 36.0–46.0)
Hemoglobin: 10.6 g/dL — ABNORMAL LOW (ref 12.0–15.0)
Immature Granulocytes: 0 %
Lymphocytes Relative: 23 %
Lymphs Abs: 1 10*3/uL (ref 0.7–4.0)
MCH: 33 pg (ref 26.0–34.0)
MCHC: 32.6 g/dL (ref 30.0–36.0)
MCV: 101.2 fL — ABNORMAL HIGH (ref 80.0–100.0)
Monocytes Absolute: 0.4 10*3/uL (ref 0.1–1.0)
Monocytes Relative: 9 %
Neutro Abs: 2.8 10*3/uL (ref 1.7–7.7)
Neutrophils Relative %: 66 %
Platelets: 168 10*3/uL (ref 150–400)
RBC: 3.21 MIL/uL — ABNORMAL LOW (ref 3.87–5.11)
RDW: 14.9 % (ref 11.5–15.5)
WBC: 4.4 10*3/uL (ref 4.0–10.5)
nRBC: 0 % (ref 0.0–0.2)

## 2020-12-02 MED ORDER — PROCHLORPERAZINE MALEATE 10 MG PO TABS
10.0000 mg | ORAL_TABLET | Freq: Once | ORAL | Status: AC
  Administered 2020-12-02: 10 mg via ORAL
  Filled 2020-12-02: qty 1

## 2020-12-02 MED ORDER — DEXAMETHASONE 4 MG PO TABS
40.0000 mg | ORAL_TABLET | Freq: Once | ORAL | Status: AC
  Administered 2020-12-02: 40 mg via ORAL
  Filled 2020-12-02: qty 10

## 2020-12-02 MED ORDER — BORTEZOMIB CHEMO SQ INJECTION 3.5 MG (2.5MG/ML)
1.3000 mg/m2 | Freq: Once | INTRAMUSCULAR | Status: AC
  Administered 2020-12-02: 2.25 mg via SUBCUTANEOUS
  Filled 2020-12-02: qty 0.9

## 2020-12-02 NOTE — Patient Instructions (Signed)
[facility] Discharge Instructions for Patients Receiving Chemotherapy   Beginning January 23rd 2017 lab work for the [facility] will be done in the  Main lab at [facility] on 1st floor. If you have a lab appointment with the [facility] please come in thru the  Main Entrance and check in at the main information desk   Today you received the following chemotherapy agents Velcade. Follow-up as scheduled  To help prevent nausea and vomiting after your treatment, we encourage you to take your nausea medication   If you develop nausea and vomiting, or diarrhea that is not controlled by your medication, call the clinic.  The clinic phone number is [phone]. Office hours are Monday-Friday 8:30am-5:00pm.  BELOW ARE SYMPTOMS THAT SHOULD BE REPORTED IMMEDIATELY:  *FEVER GREATER THAN 101.0 F  *CHILLS WITH OR WITHOUT FEVER  NAUSEA AND VOMITING THAT IS NOT CONTROLLED WITH YOUR NAUSEA MEDICATION  *UNUSUAL SHORTNESS OF BREATH  *UNUSUAL BRUISING OR BLEEDING  TENDERNESS IN MOUTH AND THROAT WITH OR WITHOUT PRESENCE OF ULCERS  *URINARY PROBLEMS  *BOWEL PROBLEMS  UNUSUAL RASH Items with * indicate a potential emergency and should be followed up as soon as possible. If you have an emergency after office hours please contact your primary care physician or go to the nearest emergency department.  Please call the clinic during office hours if you have any questions or concerns.   You may also contact the Patient Navigator at [phone] should you have any questions or need assistance in obtaining follow up care.      Resources For Cancer Patients and their Caregivers ? American Cancer Society: Can assist with transportation, wigs, general needs, runs Look Good Feel Better.        579 075 5189 ? Cancer Care: Provides financial assistance, online support groups, medication/co-pay assistance.  1-800-813-HOPE (513)002-0399) ? [place] Assists [place] cancer patients and their families through emotional , educational and financial support.  (843)379-4900 ? [place] DSS Where to apply for food stamps, Medicaid and utility assistance. 314-796-9160 ? [facility]: Transportation to medical appointments. 940-852-8640 ? Social Security Administration: May apply for disability if have a Stage IV cancer. 2086031591 6462257539 ? [employer], Disability and Transit Services: Assists with nutrition, care and transit needs. [phone]

## 2020-12-02 NOTE — Progress Notes (Signed)
Brittany Archer tolerated Velcade injection well without complaints or incident. Labs reviewed prior to administering this medication. K+ 3.2 today and pt to continue to take her Potassium PO as prescribed. VSS Pt discharged self ambulatory in satisfactory condition accompanied by family member

## 2020-12-03 ENCOUNTER — Other Ambulatory Visit (HOSPITAL_COMMUNITY): Payer: Self-pay

## 2020-12-03 DIAGNOSIS — C9000 Multiple myeloma not having achieved remission: Secondary | ICD-10-CM

## 2020-12-03 MED ORDER — LENALIDOMIDE 20 MG PO CAPS: ORAL_CAPSULE | ORAL | 0 refills | Status: DC

## 2020-12-03 NOTE — Telephone Encounter (Signed)
Chart reviewed. Revlimid refilled per Dr. Delton Coombes

## 2020-12-04 ENCOUNTER — Encounter: Payer: Self-pay | Admitting: Plastic Surgery

## 2020-12-04 NOTE — Progress Notes (Signed)
The patient is an 79 yrs old female joining me by phone.  She was seen 2 weeks ago for a wound in her mouth.  She has exposure of the right mandible.  She had a tooth infection that resulted in osteomyelitis. There is a significant amount of mandible exposed.    I discussed the case with Dr. [name] and he is going to follow up with the patient.  She is aware and will let me know if she needs me to do anything.  We spent 5 minutes on the phone.  I was at work and she was at home.

## 2020-12-06 ENCOUNTER — Other Ambulatory Visit (HOSPITAL_COMMUNITY): Payer: Self-pay

## 2020-12-06 DIAGNOSIS — C9000 Multiple myeloma not having achieved remission: Secondary | ICD-10-CM

## 2020-12-06 DIAGNOSIS — D649 Anemia, unspecified: Secondary | ICD-10-CM

## 2020-12-08 NOTE — Progress Notes (Signed)
[place], [place] [address]   CLINIC:  Medical Oncology/Hematology  PCP:   [name], MD [address] [place] / [place] [address] [phone]   REASON FOR VISIT:   Follow-up for multiple myeloma  PRIOR THERAPY: None  NGS Results: Not done  CURRENT THERAPY: Bortezomib  BRIEF ONCOLOGIC HISTORY:  Oncology History  Multiple myeloma not having achieved remission ([place] [facility])  01/20/2018 Initial Diagnosis   Multiple myeloma not having achieved remission ([place] [facility])   01/26/2018 -  Chemotherapy   The patient had dexamethasone (DECADRON) tablet 40 mg, 40 mg (100 % of original dose 40 mg), Oral,  Once, 20 of 21 cycles Dose modification: 40 mg (original dose 40 mg, Cycle 17), 40 mg (original dose 40 mg, Cycle 20) Administration: 40 mg (04/24/2019), 40 mg (05/23/2019), 40 mg (06/20/2019), 40 mg (07/11/2019), 40 mg (07/18/2019), 40 mg (07/25/2019), 40 mg (08/08/2019), 40 mg (08/15/2019), 40 mg (08/22/2019), 40 mg (09/05/2019), 40 mg (09/12/2019), 40 mg (09/19/2019), 40 mg (10/03/2019), 40 mg (10/10/2019), 40 mg (10/17/2019), 40 mg (10/31/2019), 40 mg (11/07/2019), 40 mg (11/14/2019), 40 mg (11/29/2019), 40 mg (12/06/2019), 40 mg (12/13/2019), 40 mg (01/03/2020), 40 mg (01/10/2020), 40 mg (01/17/2020), 40 mg (01/31/2020), 40 mg (02/07/2020), 40 mg (02/14/2020), 40 mg (04/17/2020), 40 mg (05/21/2020), 40 mg (06/04/2020), 40 mg (06/19/2020), 40 mg (06/26/2020), 40 mg (07/03/2020), 40 mg (07/17/2020), 40 mg (07/24/2020), 40 mg (07/31/2020), 40 mg (08/14/2020), 40 mg (08/21/2020), 40 mg (08/28/2020), 40 mg (09/11/2020), 40 mg (09/18/2020), 40 mg (09/30/2020), 40 mg (10/14/2020), 40 mg (10/21/2020), 40 mg (11/08/2020), 40 mg (11/25/2020), 40 mg (12/02/2020) dexamethasone (DECADRON) 4 MG tablet, 1 of 1 cycle, Start date: 04/24/2019, End date: -- lenalidomide (REVLIMID) 20 MG capsule, 1 of 1 cycle, Start date: 12/03/2020, End date: -- bortezomib SQ (VELCADE) chemo injection 2.5 mg, 1.3 mg/m2 = 2.5 mg, Subcutaneous,   Once, 36 of 37 cycles Administration: 2.5 mg (01/26/2018), 2.5 mg (02/02/2018), 2.5 mg (02/09/2018), 2.5 mg (02/16/2018), 2.5 mg (02/23/2018), 2.5 mg (03/02/2018), 2.5 mg (03/09/2018), 2.5 mg (03/30/2018), 2.5 mg (04/06/2018), 2.5 mg (04/13/2018), 2.5 mg (04/21/2018), 2.5 mg (05/06/2018), 2.5 mg (05/11/2018), 2.5 mg (05/20/2018), 2.5 mg (05/27/2018), 2.5 mg (06/10/2018), 2.5 mg (06/17/2018), 2.5 mg (06/24/2018), 2.5 mg (07/08/2018), 2.5 mg (07/15/2018), 2.5 mg (07/22/2018), 2.5 mg (08/05/2018), 2.5 mg (08/12/2018), 2.5 mg (09/02/2018), 2.5 mg (09/09/2018), 2.5 mg (09/16/2018), 2.5 mg (09/30/2018), 2.5 mg (10/07/2018), 2.5 mg (10/14/2018), 2.5 mg (10/31/2018), 2.5 mg (11/07/2018), 2.5 mg (11/14/2018), 2.5 mg (11/28/2018), 2.25 mg (12/05/2018), 2.25 mg (12/12/2018), 2.25 mg (12/26/2018), 2.25 mg (01/02/2019), 2.25 mg (01/09/2019), 2.25 mg (01/23/2019), 2.25 mg (01/30/2019), 2.25 mg (02/06/2019), 2.25 mg (02/20/2019), 2.25 mg (02/27/2019), 2.25 mg (03/06/2019), 2.25 mg (03/20/2019), 2.25 mg (03/27/2019), 2.25 mg (04/03/2019), 2.25 mg (04/17/2019), 2.25 mg (04/24/2019), 2.25 mg (05/01/2019), 2.25 mg (05/15/2019), 2.25 mg (05/23/2019), 2.25 mg (05/30/2019), 2.25 mg (06/13/2019), 2.25 mg (06/20/2019), 2.25 mg (06/27/2019), 2.25 mg (07/11/2019), 2.25 mg (07/18/2019), 2.25 mg (07/25/2019), 2.25 mg (08/08/2019), 2.25 mg (08/15/2019), 2.25 mg (08/22/2019), 2.25 mg (09/05/2019), 2.25 mg (09/12/2019), 2.25 mg (09/19/2019), 2.25 mg (10/03/2019), 2.25 mg (10/10/2019), 2.25 mg (10/17/2019), 2.25 mg (10/31/2019), 2.25 mg (11/07/2019), 2.25 mg (11/14/2019), 2.25 mg (11/29/2019), 2.25 mg (12/06/2019), 2.25 mg (12/13/2019), 2.25 mg (01/03/2020), 2.25 mg (01/10/2020), 2.25 mg (01/17/2020), 2.25 mg (01/31/2020), 2.25 mg (02/07/2020), 2.25 mg (02/14/2020), 2.25 mg (04/17/2020), 2.25 mg (05/21/2020), 2.25 mg (05/28/2020), 2.25 mg (06/04/2020), 2.25 mg (06/19/2020), 2.25 mg (06/26/2020), 2.25 mg (07/03/2020), 2.25 mg (07/17/2020), 2.25 mg (07/24/2020), 2.25 mg (07/31/2020),  2.25 mg (08/14/2020), 2.25 mg (08/21/2020), 2.25 mg  (08/28/2020), 2.25 mg (09/11/2020), 2.25 mg (09/18/2020), 2.25 mg (09/30/2020), 2.25 mg (10/14/2020), 2.25 mg (10/21/2020), 2.25 mg (11/08/2020), 2.25 mg (11/25/2020), 2.25 mg (12/02/2020)  for chemotherapy treatment.      CANCER STAGING:  Cancer Staging No matching staging information was found for the patient.  INTERVAL HISTORY:   [name], a 79 y.o. female, returns for routine follow-up and consideration for next cycle of chemotherapy.   Due for day #8 of cycle #32 of bortezomib today.   Overall, she feels ready for next cycle of chemo today.  No B symptoms. New bone pains.   REVIEW OF SYSTEMS:  Review of Systems  Constitutional: Negative for appetite change and unexpected weight change (11 lbs in 1 month). Fatigue: mild.  Gastrointestinal: Negative for diarrhea and nausea.  Neurological: Negative for numbness.  All other systems reviewed and are negative.   PAST MEDICAL/SURGICAL HISTORY:  Past Medical History:  Diagnosis Date  . Breast cancer ([facility])    left breast/ 2008/ surg/ rad tx  . Coronary artery disease   . Diabetes mellitus    Past Surgical History:  Procedure Laterality Date  . ABDOMINAL HYSTERECTOMY    . BREAST SURGERY    . DEBRIDEMENT MANDIBLE N/A 02/22/2020   Procedure: INCISION AND DRAINAGE WITH DEBRIDEMENT MANDIBLE;  Surgeon: Michael Litter, DMD;  Location: [facility];  Service: Oral Surgery;  Laterality: N/A;  . TOOTH EXTRACTION N/A 02/22/2020   Procedure: DENTAL RESTORATION/EXTRACTIONS;  Surgeon: Michael Litter, DMD;  Location: [facility];  Service: Oral Surgery;  Laterality: N/A;  DENTAL KIT REQUESTED    SOCIAL HISTORY:  Social History   Socioeconomic History  . Marital status: Divorced    Spouse name: Not on file  . Number of children: Not on file  . Years of education: Not on file  . Highest education level: Not on file  Occupational History  . Not on file  Tobacco Use  . Smoking status: Never Smoker  . Smokeless tobacco: Never Used  Vaping  Use  . Vaping Use: Never used  Substance and Sexual Activity  . Alcohol use: No  . Drug use: No  . Sexual activity: Yes    Birth control/protection: Surgical  Other Topics Concern  . Not on file  Social History Narrative  . Not on file   Social Determinants of Health   Financial Resource Strain: Not on file  Food Insecurity: Not on file  Transportation Needs: Not on file  Physical Activity: Not on file  Stress: Not on file  Social Connections: Not on file  Intimate Partner Violence: Not on file    FAMILY HISTORY:  Family History  Problem Relation Age of Onset  . Obesity Sister     CURRENT MEDICATIONS:  Current Outpatient Medications  Medication Sig Dispense Refill  . acyclovir (ZOVIRAX) 400 MG tablet TAKE 1 TABLET BY MOUTH TWICE DAILY 60 tablet 11  . aspirin 81 MG tablet Take 81 mg by mouth daily.      . bortezomib IV (VELCADE) 3.5 MG injection Inject 3.5 mg into the vein once a week. weekly     . chlorhexidine (PERIDEX) 0.12 % solution Use as directed 15 mLs in the mouth or throat 2 (two) times daily.     Marland Kitchen dexamethasone (DECADRON) 4 MG tablet Take 10 tablets (40 mg) on days 1, 8, and 15 of chemo. Repeat every 21 days. 30 tablet 3  . glipiZIDE (GLUCOTROL) 5 MG tablet Take  5 mg by mouth daily before breakfast.     . ketoconazole (NIZORAL) 2 % cream Apply topically.    Marland Kitchen lenalidomide (REVLIMID) 20 MG capsule Take 1 capsule by mouth once daily for 14 days on, and 7 days off of a 21 day cycle. 14 capsule 0  . lisinopril-hydrochlorothiazide (PRINZIDE,ZESTORETIC) 20-25 MG tablet Take 1 tablet by mouth every morning.     . magnesium oxide (MAG-OX) 400 (241.3 Mg) MG tablet Take 1 tablet (400 mg total) by mouth in the morning, at noon, and at bedtime. 90 tablet 6  . metFORMIN (GLUCOPHAGE) 1000 MG tablet Take 1,000 mg by mouth 2 times daily at 12 noon and 4 pm.      . ofloxacin (OCUFLOX) 0.3 % ophthalmic solution Place 1 drop into both eyes.     . potassium chloride SA (KLOR-CON)  20 MEQ tablet TAKE (2) TABLETS BY MOUTH THREE TIMES DAILY. 168 tablet 11  . prednisoLONE acetate (PRED FORTE) 1 % ophthalmic suspension Place 1 drop into both eyes.      No current facility-administered medications for this visit.    ALLERGIES:  Allergies  Allergen Reactions  . Motrin [Ibuprofen] Rash    PHYSICAL EXAM:  Performance status (ECOG): 1 - Symptomatic but completely ambulatory  There were no vitals filed for this visit. Wt Readings from Last 3 Encounters:  12/02/20 166 lb 12.8 oz (75.7 kg)  11/25/20 166 lb (75.3 kg)  11/08/20 168 lb 3.2 oz (76.3 kg)   Physical Exam Vitals reviewed.  Constitutional:      Appearance: Normal appearance.  Cardiovascular:     Rate and Rhythm: Normal rate and regular rhythm.  Pulmonary:     Effort: Pulmonary effort is normal.     Breath sounds: Normal breath sounds. No wheezing or rhonchi.  Abdominal:     General: Abdomen is flat.     Palpations: Abdomen is soft.  Musculoskeletal:     Cervical back: Normal range of motion and neck supple.     Right lower leg: No edema.     Left lower leg: No edema.  Neurological:     General: No focal deficit present.     Mental Status: She is alert and oriented to person, place, and time.  Psychiatric:        Mood and Affect: Mood normal.        Behavior: Behavior normal.      LABORATORY DATA:  I have reviewed the labs as listed.  CBC Latest Ref Rng & Units 12/02/2020 11/25/2020 11/08/2020  WBC 4.0 - 10.5 K/uL 4.4 4.0 2.9(L)  Hemoglobin 12.0 - 15.0 g/dL 10.6(L) 10.9(L) 10.6(L)  Hematocrit 36.0 - 46.0 % 32.5(L) 33.5(L) 33.0(L)  Platelets 150 - 400 K/uL 168 181 208   CMP Latest Ref Rng & Units 12/02/2020 11/25/2020 11/08/2020  Glucose 70 - 99 mg/dL 114(H) 108(H) 129(H)  BUN 8 - 23 mg/dL 25(H) 27(H) 12  Creatinine 0.44 - 1.00 mg/dL 1.05(H) 1.10(H) 1.09(H)  Sodium 135 - 145 mmol/L 138 139 141  Potassium 3.5 - 5.1 mmol/L 3.2(L) 3.5 2.4(LL)  Chloride 98 - 111 mmol/L 102 102 101  CO2 22 - 32  mmol/L '27 28 28  ' Calcium 8.9 - 10.3 mg/dL 9.3 10.1 8.9  Total Protein 6.5 - 8.1 g/dL 6.4(L) 6.9 6.5  Total Bilirubin 0.3 - 1.2 mg/dL 0.7 0.6 1.0  Alkaline Phos 38 - 126 U/L 61 63 54  AST 15 - 41 U/L 11(L) 11(L) 17  ALT 0 - 44 U/L  '9 9 10   ' Lab Results  Component Value Date   LDH 145 11/08/2020   LDH 107 09/18/2020   LDH 136 08/28/2020   Lab Results  Component Value Date   TOTALPROTELP 6.0 11/08/2020   ALBUMINELP 3.2 11/08/2020   A1GS 0.2 11/08/2020   A2GS 0.8 11/08/2020   BETS 1.1 11/08/2020   GAMS 0.7 11/08/2020   MSPIKE Not Observed 11/08/2020   SPEI Comment 11/08/2020   Lab Results  Component Value Date   KPAFRELGTCHN 33.5 (H) 11/08/2020   LAMBDASER 18.3 11/08/2020   KAPLAMBRATIO 1.83 (H) 11/08/2020    DIAGNOSTIC IMAGING:  I have independently reviewed the scans and discussed with the patient. No results found.   ASSESSMENT:   1. IgA kappa plasma cell myeloma, stage I:  -RVD started on 01/09/2018, held since 02/14/2020 due to mandible abscess. -Myeloma labs on 05/14/2020 showed progression with M spike of 0.5 g. -RVD started back on 05/21/2020. -Last labs from November with no M spike, stable K/L ratio  2. Osteomyelitis of the right mandible/dental abscess: -We will hold Xgeva indefinitely.   PLAN:   1. IgA kappa plasma cell myeloma, stage I:  -She is tolerating Revlimid and Velcade 3 weeks on 1 week off very well. - Myeloma labs pending. - CBC and CMP otherwise reviewed, no major concerns. - Ok to proceed with treatment - If agreeable with Dr Raliegh Ip, can consider dex 20 mg weekly since patient is older than 12 yrs   2. Severe hypokalemia: Recommend 20 meq IV today 40 meq TID tablets at home. Encourage one banana daily  3. Osteomyelitis of the right mandible/dental abscess: -Bisphosphonate on hold indefinitely.  4. 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 -Continue Retacrit as needed.  Hemoglobin today is 10.2.    Orders placed this encounter:  No orders of the defined types were placed in this encounter.  Benay Pike MD

## 2020-12-09 ENCOUNTER — Inpatient Hospital Stay (HOSPITAL_COMMUNITY): Payer: Medicare Other

## 2020-12-09 ENCOUNTER — Other Ambulatory Visit (HOSPITAL_COMMUNITY): Payer: Self-pay | Admitting: *Deleted

## 2020-12-09 ENCOUNTER — Encounter (HOSPITAL_COMMUNITY): Payer: Self-pay | Admitting: Hematology and Oncology

## 2020-12-09 ENCOUNTER — Inpatient Hospital Stay (HOSPITAL_BASED_OUTPATIENT_CLINIC_OR_DEPARTMENT_OTHER): Payer: Medicare Other | Admitting: Hematology and Oncology

## 2020-12-09 ENCOUNTER — Other Ambulatory Visit: Payer: Self-pay

## 2020-12-09 VITALS — BP 153/66 | HR 65 | Temp 97.9°F | Resp 18

## 2020-12-09 VITALS — BP 138/60 | HR 65 | Temp 98.6°F | Resp 18 | Wt 167.9 lb

## 2020-12-09 DIAGNOSIS — C9000 Multiple myeloma not having achieved remission: Secondary | ICD-10-CM

## 2020-12-09 DIAGNOSIS — E538 Deficiency of other specified B group vitamins: Secondary | ICD-10-CM

## 2020-12-09 DIAGNOSIS — D649 Anemia, unspecified: Secondary | ICD-10-CM

## 2020-12-09 DIAGNOSIS — Z5112 Encounter for antineoplastic immunotherapy: Secondary | ICD-10-CM | POA: Diagnosis not present

## 2020-12-09 LAB — COMPREHENSIVE METABOLIC PANEL
ALT: 8 U/L (ref 0–44)
AST: 13 U/L — ABNORMAL LOW (ref 15–41)
Albumin: 3.5 g/dL (ref 3.5–5.0)
Alkaline Phosphatase: 59 U/L (ref 38–126)
Anion gap: 10 (ref 5–15)
BUN: 14 mg/dL (ref 8–23)
CO2: 26 mmol/L (ref 22–32)
Calcium: 9.9 mg/dL (ref 8.9–10.3)
Chloride: 102 mmol/L (ref 98–111)
Creatinine, Ser: 1.07 mg/dL — ABNORMAL HIGH (ref 0.44–1.00)
GFR, Estimated: 53 mL/min — ABNORMAL LOW (ref >60)
Glucose, Bld: 138 mg/dL — ABNORMAL HIGH (ref 70–99)
Potassium: 2.8 mmol/L — ABNORMAL LOW (ref 3.5–5.1)
Sodium: 138 mmol/L (ref 135–145)
Total Bilirubin: 0.6 mg/dL (ref 0.3–1.2)
Total Protein: 6.5 g/dL (ref 6.5–8.1)

## 2020-12-09 LAB — CBC WITH DIFFERENTIAL/PLATELET
Abs Immature Granulocytes: 0.02 10*3/uL (ref 0.00–0.07)
Basophils Absolute: 0 10*3/uL (ref 0.0–0.1)
Basophils Relative: 0 %
Eosinophils Absolute: 0.1 10*3/uL (ref 0.0–0.5)
Eosinophils Relative: 2 %
HCT: 33 % — ABNORMAL LOW (ref 36.0–46.0)
Hemoglobin: 10.7 g/dL — ABNORMAL LOW (ref 12.0–15.0)
Immature Granulocytes: 0 %
Lymphocytes Relative: 19 %
Lymphs Abs: 0.9 10*3/uL (ref 0.7–4.0)
MCH: 32.7 pg (ref 26.0–34.0)
MCHC: 32.4 g/dL (ref 30.0–36.0)
MCV: 100.9 fL — ABNORMAL HIGH (ref 80.0–100.0)
Monocytes Absolute: 0.4 10*3/uL (ref 0.1–1.0)
Monocytes Relative: 9 %
Neutro Abs: 3.2 10*3/uL (ref 1.7–7.7)
Neutrophils Relative %: 70 %
Platelets: 140 10*3/uL — ABNORMAL LOW (ref 150–400)
RBC: 3.27 MIL/uL — ABNORMAL LOW (ref 3.87–5.11)
RDW: 14.8 % (ref 11.5–15.5)
WBC: 4.7 10*3/uL (ref 4.0–10.5)
nRBC: 0 % (ref 0.0–0.2)

## 2020-12-09 LAB — LACTATE DEHYDROGENASE: LDH: 135 U/L (ref 98–192)

## 2020-12-09 MED ORDER — PROCHLORPERAZINE MALEATE 10 MG PO TABS
10.0000 mg | ORAL_TABLET | Freq: Once | ORAL | Status: AC
Start: 2020-12-09 — End: 2020-12-09
  Administered 2020-12-09: 12:00:00 10 mg via ORAL
  Filled 2020-12-09: qty 1

## 2020-12-09 MED ORDER — BORTEZOMIB CHEMO SQ INJECTION 3.5 MG (2.5MG/ML)
1.3000 mg/m2 | Freq: Once | INTRAMUSCULAR | Status: AC
  Administered 2020-12-09: 13:00:00 2.25 mg via SUBCUTANEOUS
  Filled 2020-12-09: qty 0.9

## 2020-12-09 MED ORDER — POTASSIUM CHLORIDE 10 MEQ/100ML IV SOLN
10.0000 meq | INTRAVENOUS | Status: AC
  Administered 2020-12-09 (×2): 10 meq via INTRAVENOUS
  Filled 2020-12-09 (×2): qty 100

## 2020-12-09 MED ORDER — SODIUM CHLORIDE 0.9 % IV SOLN: INTRAVENOUS | Status: DC

## 2020-12-09 MED ORDER — CYANOCOBALAMIN 1000 MCG/ML IJ SOLN
1000.0000 ug | Freq: Once | INTRAMUSCULAR | Status: AC
  Administered 2020-12-09: 13:00:00 1000 ug via INTRAMUSCULAR
  Filled 2020-12-09: qty 1

## 2020-12-09 MED ORDER — DEXAMETHASONE 4 MG PO TABS
40.0000 mg | ORAL_TABLET | Freq: Once | ORAL | Status: AC
  Administered 2020-12-09: 12:00:00 40 mg via ORAL
  Filled 2020-12-09: qty 10

## 2020-12-09 NOTE — Progress Notes (Signed)
Patient tolerated potassium infusions with no complaints voiced.  Patient tolerated Velcade injection with no complaints voiced.  Lab work reviewed.  See MAR for details.  Injection site clean and dry with no bruising or swelling noted.  Band aid applied.  VSS.  Patient left in satisfactory condition with no s/s of distress noted.

## 2020-12-09 NOTE — Progress Notes (Signed)
Patient was assessed by Dr. Chryl Heck and labs have been reviewed.  Potassium today is 2.8, orders received for potassium 20 mEq IV today.  Patient is okay to proceed with treatment today. Primary RN and pharmacy aware.

## 2020-12-10 LAB — PROTEIN ELECTROPHORESIS, SERUM
A/G Ratio: 1.1 (ref 0.7–1.7)
Albumin ELP: 3.2 g/dL (ref 2.9–4.4)
Alpha-1-Globulin: 0.2 g/dL (ref 0.0–0.4)
Alpha-2-Globulin: 0.9 g/dL (ref 0.4–1.0)
Beta Globulin: 1.1 g/dL (ref 0.7–1.3)
Gamma Globulin: 0.8 g/dL (ref 0.4–1.8)
Globulin, Total: 3 g/dL (ref 2.2–3.9)
M-Spike, %: 0.5 g/dL — ABNORMAL HIGH
Total Protein ELP: 6.2 g/dL (ref 6.0–8.5)

## 2020-12-10 LAB — KAPPA/LAMBDA LIGHT CHAINS
Kappa free light chain: 18.8 mg/L (ref 3.3–19.4)
Kappa, lambda light chain ratio: 1.63 (ref 0.26–1.65)
Lambda free light chains: 11.5 mg/L (ref 5.7–26.3)

## 2020-12-24 ENCOUNTER — Inpatient Hospital Stay (HOSPITAL_COMMUNITY): Payer: Medicare Other

## 2020-12-24 ENCOUNTER — Other Ambulatory Visit: Payer: Self-pay

## 2020-12-24 ENCOUNTER — Encounter (HOSPITAL_COMMUNITY): Payer: Self-pay | Admitting: Hematology

## 2020-12-24 ENCOUNTER — Inpatient Hospital Stay (HOSPITAL_BASED_OUTPATIENT_CLINIC_OR_DEPARTMENT_OTHER): Payer: Medicare Other | Admitting: Hematology

## 2020-12-24 VITALS — BP 133/60 | HR 82 | Temp 97.2°F | Resp 18 | Wt 169.4 lb

## 2020-12-24 DIAGNOSIS — Z5112 Encounter for antineoplastic immunotherapy: Secondary | ICD-10-CM | POA: Diagnosis not present

## 2020-12-24 DIAGNOSIS — C9000 Multiple myeloma not having achieved remission: Secondary | ICD-10-CM

## 2020-12-24 DIAGNOSIS — E538 Deficiency of other specified B group vitamins: Secondary | ICD-10-CM

## 2020-12-24 LAB — COMPREHENSIVE METABOLIC PANEL
ALT: 9 U/L (ref 0–44)
AST: 13 U/L — ABNORMAL LOW (ref 15–41)
Albumin: 3.6 g/dL (ref 3.5–5.0)
Alkaline Phosphatase: 56 U/L (ref 38–126)
Anion gap: 11 (ref 5–15)
BUN: 16 mg/dL (ref 8–23)
CO2: 23 mmol/L (ref 22–32)
Calcium: 9.1 mg/dL (ref 8.9–10.3)
Chloride: 106 mmol/L (ref 98–111)
Creatinine, Ser: 1.11 mg/dL — ABNORMAL HIGH (ref 0.44–1.00)
GFR, Estimated: 50 mL/min — ABNORMAL LOW (ref >60)
Glucose, Bld: 88 mg/dL (ref 70–99)
Potassium: 2.8 mmol/L — ABNORMAL LOW (ref 3.5–5.1)
Sodium: 140 mmol/L (ref 135–145)
Total Bilirubin: 0.9 mg/dL (ref 0.3–1.2)
Total Protein: 6.7 g/dL (ref 6.5–8.1)

## 2020-12-24 LAB — CBC WITH DIFFERENTIAL/PLATELET
Abs Immature Granulocytes: 0.01 10*3/uL (ref 0.00–0.07)
Basophils Absolute: 0 10*3/uL (ref 0.0–0.1)
Basophils Relative: 1 %
Eosinophils Absolute: 0.1 10*3/uL (ref 0.0–0.5)
Eosinophils Relative: 3 %
HCT: 29.3 % — ABNORMAL LOW (ref 36.0–46.0)
Hemoglobin: 9.5 g/dL — ABNORMAL LOW (ref 12.0–15.0)
Immature Granulocytes: 0 %
Lymphocytes Relative: 21 %
Lymphs Abs: 0.8 10*3/uL (ref 0.7–4.0)
MCH: 32.9 pg (ref 26.0–34.0)
MCHC: 32.4 g/dL (ref 30.0–36.0)
MCV: 101.4 fL — ABNORMAL HIGH (ref 80.0–100.0)
Monocytes Absolute: 0.4 10*3/uL (ref 0.1–1.0)
Monocytes Relative: 10 %
Neutro Abs: 2.4 10*3/uL (ref 1.7–7.7)
Neutrophils Relative %: 65 %
Platelets: 193 10*3/uL (ref 150–400)
RBC: 2.89 MIL/uL — ABNORMAL LOW (ref 3.87–5.11)
RDW: 15.6 % — ABNORMAL HIGH (ref 11.5–15.5)
WBC: 3.7 10*3/uL — ABNORMAL LOW (ref 4.0–10.5)
nRBC: 0 % (ref 0.0–0.2)

## 2020-12-24 MED ORDER — EPOETIN ALFA-EPBX 20000 UNIT/ML IJ SOLN
INTRAMUSCULAR | Status: AC
  Filled 2020-12-24: qty 1

## 2020-12-24 MED ORDER — EPOETIN ALFA-EPBX 10000 UNIT/ML IJ SOLN
20000.0000 [IU] | Freq: Once | INTRAMUSCULAR | Status: AC
  Administered 2020-12-24: 10:00:00 20000 [IU] via SUBCUTANEOUS
  Filled 2020-12-24: qty 2

## 2020-12-24 MED ORDER — POTASSIUM CHLORIDE CRYS ER 20 MEQ PO TBCR
40.0000 meq | EXTENDED_RELEASE_TABLET | Freq: Once | ORAL | Status: AC
  Administered 2020-12-24: 10:00:00 40 meq via ORAL
  Filled 2020-12-24: qty 2

## 2020-12-24 MED ORDER — PROCHLORPERAZINE MALEATE 10 MG PO TABS
10.0000 mg | ORAL_TABLET | Freq: Once | ORAL | Status: AC
Start: 2020-12-24 — End: 2020-12-24
  Administered 2020-12-24: 10:00:00 10 mg via ORAL
  Filled 2020-12-24: qty 1

## 2020-12-24 MED ORDER — DEXAMETHASONE 4 MG PO TABS
40.0000 mg | ORAL_TABLET | Freq: Once | ORAL | Status: AC
  Administered 2020-12-24: 10:00:00 40 mg via ORAL
  Filled 2020-12-24: qty 10

## 2020-12-24 MED ORDER — BORTEZOMIB CHEMO SQ INJECTION 3.5 MG (2.5MG/ML)
1.3000 mg/m2 | Freq: Once | INTRAMUSCULAR | Status: AC
  Administered 2020-12-24: 10:00:00 2.25 mg via SUBCUTANEOUS
  Filled 2020-12-24: qty 0.9

## 2020-12-24 NOTE — Patient Instructions (Signed)
[facility] Discharge Instructions for Patients Receiving Chemotherapy  Today you received the following chemotherapy agents   To help prevent nausea and vomiting after your treatment, we encourage you to take your nausea medication   If you develop nausea and vomiting that is not controlled by your nausea medication, call the clinic.   BELOW ARE SYMPTOMS THAT SHOULD BE REPORTED IMMEDIATELY:  *FEVER GREATER THAN 100.5 F  *CHILLS WITH OR WITHOUT FEVER  NAUSEA AND VOMITING THAT IS NOT CONTROLLED WITH YOUR NAUSEA MEDICATION  *UNUSUAL SHORTNESS OF BREATH  *UNUSUAL BRUISING OR BLEEDING  TENDERNESS IN MOUTH AND THROAT WITH OR WITHOUT PRESENCE OF ULCERS  *URINARY PROBLEMS  *BOWEL PROBLEMS  UNUSUAL RASH Items with * indicate a potential emergency and should be followed up as soon as possible.  Feel free to call the clinic should you have any questions or concerns. The clinic phone number is [phone].  Please show the CHEMO ALERT CARD at check-in to the Emergency Department and triage nurse.   

## 2020-12-24 NOTE — Patient Instructions (Signed)
[facility] at [facility] Discharge Instructions  You were seen today by Dr. Ellin Saba. He went over your recent results. You received your treatment today; continue getting your weekly treatment. Finish the 5 days of Revlimid and take 1 week off before restarting again. Dr. Ellin Saba will see you back in 5 weeks for labs and follow up.   Thank you for choosing [facility] at [facility] to provide your oncology and hematology care.  To afford each patient quality time with our provider, please arrive at least 15 minutes before your scheduled appointment time.   If you have a lab appointment with the Cancer Center please come in thru the Main Entrance and check in at the main information desk  You need to re-schedule your appointment should you arrive 10 or more minutes late.  We strive to give you quality time with our providers, and arriving late affects you and other patients whose appointments are after yours.  Also, if you no show three or more times for appointments you may be dismissed from the clinic at the providers discretion.     Again, thank you for choosing [facility].  Our hope is that these requests will decrease the amount of time that you wait before being seen by our physicians.       _____________________________________________________________  Should you have questions after your visit to [facility], please contact our office at [phone] between the hours of 8:00 a.m. and 4:30 p.m.  Voicemails left after 4:00 p.m. will not be returned until the following business day.  For prescription refill requests, have your pharmacy contact our office and allow 72 hours.    Cancer Center Support Programs:   > Cancer Support Group  2nd Tuesday of the month 1pm-2pm, Journey Room

## 2020-12-24 NOTE — Progress Notes (Signed)
Pt here for velcade and retacrit.  K is 2.8. Hemoglobin 9.5. Pt getting potassium 40 meq PO today.  Okay for treatment today.   Dartha Rozzell Bowie presents today for injection per the provider's orders.  Velcade and retacrit 20,000 units administration without incident; injection site WNL; see MAR for injection details.  Patient tolerated procedure well and without incident.  No questions or complaints noted at this time. Discharged in stable condition ambulatory.

## 2020-12-24 NOTE — Progress Notes (Signed)
Brittany Archer, [place] [address]   CLINIC:  Medical Oncology/Hematology  PCP:  [name], MD [address] [place] / [place] [address] [phone]   REASON FOR VISIT:  Follow-up for multiple myeloma  PRIOR THERAPY: None  NGS Results: Not done  CURRENT THERAPY: Bortezomib 3 weeks on, 1 week off; Revlimid 20 mg 2 weeks on, 1 week off  BRIEF ONCOLOGIC HISTORY:  Oncology History  Multiple myeloma not having achieved remission ([place])  01/20/2018 Initial Diagnosis   Multiple myeloma not having achieved remission ([place])   01/26/2018 -  Chemotherapy   The patient had dexamethasone (DECADRON) tablet 40 mg, 40 mg (100 % of original dose 40 mg), Oral,  Once, 20 of 21 cycles Dose modification: 40 mg (original dose 40 mg, Cycle 17), 40 mg (original dose 40 mg, Cycle 20) Administration: 40 mg (04/24/2019), 40 mg (05/23/2019), 40 mg (06/20/2019), 40 mg (07/11/2019), 40 mg (07/18/2019), 40 mg (07/25/2019), 40 mg (08/08/2019), 40 mg (08/15/2019), 40 mg (08/22/2019), 40 mg (09/05/2019), 40 mg (09/12/2019), 40 mg (09/19/2019), 40 mg (10/03/2019), 40 mg (10/10/2019), 40 mg (10/17/2019), 40 mg (10/31/2019), 40 mg (11/07/2019), 40 mg (11/14/2019), 40 mg (11/29/2019), 40 mg (12/06/2019), 40 mg (12/13/2019), 40 mg (01/03/2020), 40 mg (01/10/2020), 40 mg (01/17/2020), 40 mg (01/31/2020), 40 mg (02/07/2020), 40 mg (02/14/2020), 40 mg (04/17/2020), 40 mg (05/21/2020), 40 mg (06/04/2020), 40 mg (06/19/2020), 40 mg (06/26/2020), 40 mg (07/03/2020), 40 mg (07/17/2020), 40 mg (07/24/2020), 40 mg (07/31/2020), 40 mg (08/14/2020), 40 mg (08/21/2020), 40 mg (08/28/2020), 40 mg (09/11/2020), 40 mg (09/18/2020), 40 mg (09/30/2020), 40 mg (10/14/2020), 40 mg (10/21/2020), 40 mg (11/08/2020), 40 mg (11/25/2020), 40 mg (12/02/2020), 40 mg (12/09/2020) dexamethasone (DECADRON) 4 MG tablet, 1 of 1 cycle, Start date: 04/24/2019, End date: -- lenalidomide (REVLIMID) 20 MG capsule, 1 of 1 cycle, Start date: 12/03/2020, End date:  -- bortezomib SQ (VELCADE) chemo injection 2.5 mg, 1.3 mg/m2 = 2.5 mg, Subcutaneous,  Once, 36 of 37 cycles Administration: 2.5 mg (01/26/2018), 2.5 mg (02/02/2018), 2.5 mg (02/09/2018), 2.5 mg (02/16/2018), 2.5 mg (02/23/2018), 2.5 mg (03/02/2018), 2.5 mg (03/09/2018), 2.5 mg (03/30/2018), 2.5 mg (04/06/2018), 2.5 mg (04/13/2018), 2.5 mg (04/21/2018), 2.5 mg (05/06/2018), 2.5 mg (05/11/2018), 2.5 mg (05/20/2018), 2.5 mg (05/27/2018), 2.5 mg (06/10/2018), 2.5 mg (06/17/2018), 2.5 mg (06/24/2018), 2.5 mg (07/08/2018), 2.5 mg (07/15/2018), 2.5 mg (07/22/2018), 2.5 mg (08/05/2018), 2.5 mg (08/12/2018), 2.5 mg (09/02/2018), 2.5 mg (09/09/2018), 2.5 mg (09/16/2018), 2.5 mg (09/30/2018), 2.5 mg (10/07/2018), 2.5 mg (10/14/2018), 2.5 mg (10/31/2018), 2.5 mg (11/07/2018), 2.5 mg (11/14/2018), 2.5 mg (11/28/2018), 2.25 mg (12/05/2018), 2.25 mg (12/12/2018), 2.25 mg (12/26/2018), 2.25 mg (01/02/2019), 2.25 mg (01/09/2019), 2.25 mg (01/23/2019), 2.25 mg (01/30/2019), 2.25 mg (02/06/2019), 2.25 mg (02/20/2019), 2.25 mg (02/27/2019), 2.25 mg (03/06/2019), 2.25 mg (03/20/2019), 2.25 mg (03/27/2019), 2.25 mg (04/03/2019), 2.25 mg (04/17/2019), 2.25 mg (04/24/2019), 2.25 mg (05/01/2019), 2.25 mg (05/15/2019), 2.25 mg (05/23/2019), 2.25 mg (05/30/2019), 2.25 mg (06/13/2019), 2.25 mg (06/20/2019), 2.25 mg (06/27/2019), 2.25 mg (07/11/2019), 2.25 mg (07/18/2019), 2.25 mg (07/25/2019), 2.25 mg (08/08/2019), 2.25 mg (08/15/2019), 2.25 mg (08/22/2019), 2.25 mg (09/05/2019), 2.25 mg (09/12/2019), 2.25 mg (09/19/2019), 2.25 mg (10/03/2019), 2.25 mg (10/10/2019), 2.25 mg (10/17/2019), 2.25 mg (10/31/2019), 2.25 mg (11/07/2019), 2.25 mg (11/14/2019), 2.25 mg (11/29/2019), 2.25 mg (12/06/2019), 2.25 mg (12/13/2019), 2.25 mg (01/03/2020), 2.25 mg (01/10/2020), 2.25 mg (01/17/2020), 2.25 mg (01/31/2020), 2.25 mg (02/07/2020), 2.25 mg (02/14/2020), 2.25 mg (04/17/2020), 2.25 mg (05/21/2020), 2.25 mg (05/28/2020), 2.25 mg (06/04/2020), 2.25 mg (  06/19/2020), 2.25 mg (06/26/2020), 2.25 mg (07/03/2020), 2.25 mg (07/17/2020), 2.25 mg  (07/24/2020), 2.25 mg (07/31/2020), 2.25 mg (08/14/2020), 2.25 mg (08/21/2020), 2.25 mg (08/28/2020), 2.25 mg (09/11/2020), 2.25 mg (09/18/2020), 2.25 mg (09/30/2020), 2.25 mg (10/14/2020), 2.25 mg (10/21/2020), 2.25 mg (11/08/2020), 2.25 mg (11/25/2020), 2.25 mg (12/02/2020), 2.25 mg (12/09/2020)  for chemotherapy treatment.      CANCER STAGING: Cancer Staging No matching staging information was found for the patient.  INTERVAL HISTORY:  Brittany Archer, a 79 y.o. female, returns for routine follow-up and consideration for next cycle of chemotherapy. Brittany Archer was last seen by Dr. Lyn Hollingshead Iruku on 12/09/2020.  Due for cycle #37 of bortezomib today.   Today she is accompanied by her daughter. Overall, she tells me she has been feeling pretty well. She complains of having sore gums recently and will be seeing the dentist today. She denies having any numbness or tingling. She is taking potassium 40 mEq TID. She is taking Revlimid daily and has 5 days left until her week off. She denies having any F/C or recent infections.  Overall, she feels ready for next cycle of chemo today.    REVIEW OF SYSTEMS:  Review of Systems  Constitutional: Positive for appetite change (75%) and fatigue (75%). Negative for chills and fever.  HENT:   Positive for mouth sores (sore gums).   Neurological: Negative for numbness.  All other systems reviewed and are negative.   PAST MEDICAL/SURGICAL HISTORY:  Past Medical History:  Diagnosis Date   Breast cancer ([place])    left breast/ 2008/ surg/ rad tx   Coronary artery disease    Diabetes mellitus    Past Surgical History:  Procedure Laterality Date   ABDOMINAL HYSTERECTOMY     BREAST SURGERY     DEBRIDEMENT MANDIBLE N/A 02/22/2020   Procedure: INCISION AND DRAINAGE WITH DEBRIDEMENT MANDIBLE;  Surgeon: Michael Litter, DMD;  Location: [facility];  Service: Oral Surgery;  Laterality: N/A;   TOOTH EXTRACTION N/A 02/22/2020   Procedure: DENTAL  RESTORATION/EXTRACTIONS;  Surgeon: Michael Litter, DMD;  Location: [facility];  Service: Oral Surgery;  Laterality: N/A;  DENTAL KIT REQUESTED    SOCIAL HISTORY:  Social History   Socioeconomic History   Marital status: Divorced    Spouse name: Not on file   Number of children: Not on file   Years of education: Not on file   Highest education level: Not on file  Occupational History   Not on file  Tobacco Use   Smoking status: Never Smoker   Smokeless tobacco: Never Used  Vaping Use   Vaping Use: Never used  Substance and Sexual Activity   Alcohol use: No   Drug use: No   Sexual activity: Yes    Birth control/protection: Surgical  Other Topics Concern   Not on file  Social History Narrative   Not on file   Social Determinants of Health   Financial Resource Strain: Low Risk    Difficulty of Paying Living Expenses: Not hard at all  Food Insecurity: No Food Insecurity   Worried About Charity fundraiser in the Last Year: Never true   [place] in the Last Year: Never true  Transportation Needs: No Transportation Needs   Lack of Transportation (Medical): No   Lack of Transportation (Non-Medical): No  Physical Activity: Inactive   Days of Exercise per Week: 0 days   Minutes of Exercise per Session: 0 min  Stress: No Stress Concern Present   Feeling of  Stress : Not at all  Social Connections: Moderately Isolated   Frequency of Communication with Friends and Family: More than three times a week   Frequency of Social Gatherings with Friends and Family: More than three times a week   Attends Religious Services: More than 4 times per year   Active Member of [employer] or Organizations: No   Attends Music therapist: Never   Marital Status: Divorced  Human resources officer Violence: Not At Risk   Fear of Current or Ex-Partner: No   Emotionally Abused: No   Physically Abused: No   Sexually Abused: No    FAMILY HISTORY:  Family History   Problem Relation Age of Onset   Obesity Sister     CURRENT MEDICATIONS:  Current Outpatient Medications  Medication Sig Dispense Refill   acyclovir (ZOVIRAX) 400 MG tablet TAKE 1 TABLET BY MOUTH TWICE DAILY 60 tablet 11   aspirin 81 MG tablet Take 81 mg by mouth daily.     bortezomib IV (VELCADE) 3.5 MG injection Inject 3.5 mg into the vein once a week. weekly     chlorhexidine (PERIDEX) 0.12 % solution Use as directed 15 mLs in the mouth or throat 2 (two) times daily.      cholecalciferol (VITAMIN D) 25 MCG (1000 UNIT) tablet 1 capsule     dexamethasone (DECADRON) 4 MG tablet Take 10 tablets (40 mg) on days 1, 8, and 15 of chemo. Repeat every 21 days. 30 tablet 3   glipiZIDE (GLUCOTROL) 5 MG tablet Take 5 mg by mouth daily before breakfast.      glucose blood (ACCU-CHEK AVIVA PLUS) test strip CHECK BLOOD SUGAR ONCE DAILY     ketoconazole (NIZORAL) 2 % cream Apply topically.     lenalidomide (REVLIMID) 20 MG capsule Take 1 capsule by mouth once daily for 14 days on, and 7 days off of a 21 day cycle. 14 capsule 0   lisinopril-hydrochlorothiazide (PRINZIDE,ZESTORETIC) 20-25 MG tablet Take 1 tablet by mouth every morning.      magnesium oxide (MAG-OX) 400 (241.3 Mg) MG tablet Take 1 tablet (400 mg total) by mouth in the morning, at noon, and at bedtime. 90 tablet 6   metFORMIN (GLUCOPHAGE) 1000 MG tablet Take 1,000 mg by mouth 2 times daily at 12 noon and 4 pm.     ofloxacin (OCUFLOX) 0.3 % ophthalmic solution Place 1 drop into both eyes.      ondansetron (ZOFRAN) 8 MG tablet 1 tablet (Patient not taking: Reported on 12/09/2020)     potassium chloride SA (KLOR-CON) 20 MEQ tablet TAKE (2) TABLETS BY MOUTH THREE TIMES DAILY. 168 tablet 11   prednisoLONE acetate (PRED FORTE) 1 % ophthalmic suspension Place 1 drop into both eyes.      prochlorperazine (COMPAZINE) 10 MG tablet 1 tablet (Patient not taking: Reported on 12/09/2020)     No current facility-administered medications  for this visit.    ALLERGIES:  Allergies  Allergen Reactions   Seasonal Ic [Cholestatin] Other (See Comments)   Motrin [Ibuprofen] Rash    PHYSICAL EXAM:  Performance status (ECOG): 1 - Symptomatic but completely ambulatory  Vitals:   12/24/20 0850  BP: 133/60  Pulse: 82  Resp: 18  Temp: (!) 97.2 F (36.2 C)  SpO2: 100%   Wt Readings from Last 3 Encounters:  12/24/20 169 lb 6.4 oz (76.8 kg)  12/09/20 167 lb 14.4 oz (76.2 kg)  12/02/20 166 lb 12.8 oz (75.7 kg)   Physical Exam Vitals reviewed.  Constitutional:  Appearance: Normal appearance.  Cardiovascular:     Rate and Rhythm: Normal rate and regular rhythm.     Pulses: Normal pulses.     Heart sounds: Normal heart sounds.  Pulmonary:     Effort: Pulmonary effort is normal.     Breath sounds: Normal breath sounds.  Neurological:     General: No focal deficit present.     Mental Status: She is alert and oriented to person, place, and time.  Psychiatric:        Mood and Affect: Mood normal.        Behavior: Behavior normal.     LABORATORY DATA:  I have reviewed the labs as listed.  CBC Latest Ref Rng & Units 12/24/2020 12/09/2020 12/02/2020  WBC 4.0 - 10.5 K/uL 3.7(L) 4.7 4.4  Hemoglobin 12.0 - 15.0 g/dL 9.5(L) 10.7(L) 10.6(L)  Hematocrit 36.0 - 46.0 % 29.3(L) 33.0(L) 32.5(L)  Platelets 150 - 400 K/uL 193 140(L) 168   CMP Latest Ref Rng & Units 12/24/2020 12/09/2020 12/02/2020  Glucose 70 - 99 mg/dL 88 138(H) 114(H)  BUN 8 - 23 mg/dL 16 14 25(H)  Creatinine 0.44 - 1.00 mg/dL 1.11(H) 1.07(H) 1.05(H)  Sodium 135 - 145 mmol/L 140 138 138  Potassium 3.5 - 5.1 mmol/L 2.8(L) 2.8(L) 3.2(L)  Chloride 98 - 111 mmol/L 106 102 102  CO2 22 - 32 mmol/L $RemoveB'23 26 27  'asChxRjj$ Calcium 8.9 - 10.3 mg/dL 9.1 9.9 9.3  Total Protein 6.5 - 8.1 g/dL 6.7 6.5 6.4(L)  Total Bilirubin 0.3 - 1.2 mg/dL 0.9 0.6 0.7  Alkaline Phos 38 - 126 U/L 56 59 61  AST 15 - 41 U/L 13(L) 13(L) 11(L)  ALT 0 - 44 U/L $Remo'9 8 9    'ZjEdm$ DIAGNOSTIC IMAGING:  I  have independently reviewed the scans and discussed with the patient. No results found.   ASSESSMENT:  1. IgA kappa plasma cell myeloma, stage I: -RVD started on 01/09/2018, held since 02/14/2020 due to mandible abscess. -Myeloma labs on 05/14/2020 showed progression with M spike of 0.5 g. -RVD started back on 05/21/2020. -Myeloma labs on 07/10/2020 shows M spike improved to 0.3 g from 0.6 g previously. Free light chain ratio is 1.74 with kappa light chains 28.4. -Myeloma panel from 08/14/2020 shows M spike 0.4 g.  Kappa light chains are 24.8 and ratio is 2.23.  2. Osteomyelitis of the right mandible/dental abscess: -Finished IV ceftriaxone on 04/03/2020. Finished oral antibiotics. -We will hold Xgeva indefinitely.   PLAN:  1. IgA kappa plasma cell myeloma, stage I: -She is taking Revlimid 2 weeks on 1 week off.  Velcade is 3 weeks on 1 week off.  Dexamethasone is 40 mg weekly. -Reviewed myeloma labs from 12/09/2020.  M spike is 0.5 g.  Free light chain ratio is normal at 1.6. -Continue current treatment plan.  Repeat myeloma labs in 4 weeks and see me 1 week after that.  2. Severe hypokalemia: -She is reportedly taking potassium 40 mEq 3 times a day.  Potassium today is 2.8.  We will give 40 mEq potassium extra today.  3. Osteomyelitis of the right mandible/dental abscess: -Bisphosphonates on hold indefinitely.  4. Hypomagnesemia: -Continue magnesium 3 times a day.  5. 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  Continue Retacrit as needed.  Hemoglobin today is 9.5.   Orders placed this encounter:  No orders of the defined types were placed in this encounter.    Derek Jack, MD [facility] [phone]   I,  Milinda Antis, am acting as a scribe for Dr. Sanda Linger.  I, Derek Jack MD, have reviewed the above documentation for accuracy and completeness, and I agree with the above.

## 2020-12-30 ENCOUNTER — Other Ambulatory Visit (HOSPITAL_COMMUNITY): Payer: Self-pay

## 2020-12-30 DIAGNOSIS — C9000 Multiple myeloma not having achieved remission: Secondary | ICD-10-CM

## 2020-12-30 MED ORDER — LENALIDOMIDE 20 MG PO CAPS: ORAL_CAPSULE | ORAL | 0 refills | Status: DC

## 2020-12-30 NOTE — Telephone Encounter (Signed)
Chart reviewed. Revlimid refilled per Dr. Delton Coombes.

## 2020-12-31 ENCOUNTER — Inpatient Hospital Stay (HOSPITAL_COMMUNITY): Payer: Medicare Other

## 2020-12-31 ENCOUNTER — Ambulatory Visit (HOSPITAL_COMMUNITY): Payer: Medicare Other

## 2021-01-07 ENCOUNTER — Inpatient Hospital Stay (HOSPITAL_COMMUNITY): Payer: Medicare Other

## 2021-01-07 ENCOUNTER — Other Ambulatory Visit: Payer: Self-pay

## 2021-01-07 ENCOUNTER — Inpatient Hospital Stay (HOSPITAL_COMMUNITY): Payer: Medicare Other | Attending: Hematology

## 2021-01-07 ENCOUNTER — Telehealth (HOSPITAL_COMMUNITY): Payer: Self-pay | Admitting: Surgery

## 2021-01-07 ENCOUNTER — Encounter (HOSPITAL_COMMUNITY): Payer: Self-pay

## 2021-01-07 VITALS — BP 137/60 | HR 57 | Temp 96.8°F | Resp 18 | Wt 169.0 lb

## 2021-01-07 DIAGNOSIS — C9000 Multiple myeloma not having achieved remission: Secondary | ICD-10-CM

## 2021-01-07 DIAGNOSIS — N189 Chronic kidney disease, unspecified: Secondary | ICD-10-CM | POA: Diagnosis not present

## 2021-01-07 DIAGNOSIS — D631 Anemia in chronic kidney disease: Secondary | ICD-10-CM | POA: Diagnosis not present

## 2021-01-07 DIAGNOSIS — E876 Hypokalemia: Secondary | ICD-10-CM | POA: Diagnosis not present

## 2021-01-07 DIAGNOSIS — E538 Deficiency of other specified B group vitamins: Secondary | ICD-10-CM | POA: Insufficient documentation

## 2021-01-07 DIAGNOSIS — D649 Anemia, unspecified: Secondary | ICD-10-CM

## 2021-01-07 DIAGNOSIS — Z5112 Encounter for antineoplastic immunotherapy: Secondary | ICD-10-CM | POA: Diagnosis not present

## 2021-01-07 LAB — COMPREHENSIVE METABOLIC PANEL
ALT: 8 U/L (ref 0–44)
AST: 12 U/L — ABNORMAL LOW (ref 15–41)
Albumin: 3.4 g/dL — ABNORMAL LOW (ref 3.5–5.0)
Alkaline Phosphatase: 59 U/L (ref 38–126)
Anion gap: 11 (ref 5–15)
BUN: 13 mg/dL (ref 8–23)
CO2: 24 mmol/L (ref 22–32)
Calcium: 8.5 mg/dL — ABNORMAL LOW (ref 8.9–10.3)
Chloride: 106 mmol/L (ref 98–111)
Creatinine, Ser: 1.06 mg/dL — ABNORMAL HIGH (ref 0.44–1.00)
GFR, Estimated: 53 mL/min — ABNORMAL LOW (ref >60)
Glucose, Bld: 155 mg/dL — ABNORMAL HIGH (ref 70–99)
Potassium: 2.5 mmol/L — CL (ref 3.5–5.1)
Sodium: 141 mmol/L (ref 135–145)
Total Bilirubin: 0.8 mg/dL (ref 0.3–1.2)
Total Protein: 6.4 g/dL — ABNORMAL LOW (ref 6.5–8.1)

## 2021-01-07 LAB — CBC WITH DIFFERENTIAL/PLATELET
Abs Immature Granulocytes: 0.01 K/uL (ref 0.00–0.07)
Basophils Absolute: 0 K/uL (ref 0.0–0.1)
Basophils Relative: 0 %
Eosinophils Absolute: 0.1 K/uL (ref 0.0–0.5)
Eosinophils Relative: 3 %
HCT: 28.6 % — ABNORMAL LOW (ref 36.0–46.0)
Hemoglobin: 9.1 g/dL — ABNORMAL LOW (ref 12.0–15.0)
Immature Granulocytes: 0 %
Lymphocytes Relative: 26 %
Lymphs Abs: 0.7 K/uL (ref 0.7–4.0)
MCH: 32.3 pg (ref 26.0–34.0)
MCHC: 31.8 g/dL (ref 30.0–36.0)
MCV: 101.4 fL — ABNORMAL HIGH (ref 80.0–100.0)
Monocytes Absolute: 0.3 K/uL (ref 0.1–1.0)
Monocytes Relative: 10 %
Neutro Abs: 1.7 K/uL (ref 1.7–7.7)
Neutrophils Relative %: 61 %
Platelets: 236 K/uL (ref 150–400)
RBC: 2.82 MIL/uL — ABNORMAL LOW (ref 3.87–5.11)
RDW: 16.1 % — ABNORMAL HIGH (ref 11.5–15.5)
WBC: 2.8 K/uL — ABNORMAL LOW (ref 4.0–10.5)
nRBC: 0 % (ref 0.0–0.2)

## 2021-01-07 LAB — LACTATE DEHYDROGENASE: LDH: 133 U/L (ref 98–192)

## 2021-01-07 MED ORDER — DEXAMETHASONE 4 MG PO TABS
40.0000 mg | ORAL_TABLET | Freq: Once | ORAL | Status: AC
  Administered 2021-01-07: 40 mg via ORAL
  Filled 2021-01-07: qty 10

## 2021-01-07 MED ORDER — PROCHLORPERAZINE MALEATE 10 MG PO TABS
10.0000 mg | ORAL_TABLET | Freq: Once | ORAL | Status: AC
  Administered 2021-01-07: 10 mg via ORAL
  Filled 2021-01-07: qty 1

## 2021-01-07 MED ORDER — SODIUM CHLORIDE 0.9 % IV SOLN: INTRAVENOUS | Status: DC

## 2021-01-07 MED ORDER — POTASSIUM CHLORIDE CRYS ER 20 MEQ PO TBCR
40.0000 meq | EXTENDED_RELEASE_TABLET | Freq: Once | ORAL | Status: AC
  Administered 2021-01-07: 40 meq via ORAL
  Filled 2021-01-07: qty 2

## 2021-01-07 MED ORDER — POTASSIUM CHLORIDE 10 MEQ/100ML IV SOLN
10.0000 meq | INTRAVENOUS | Status: AC
  Administered 2021-01-07 (×2): 10 meq via INTRAVENOUS
  Filled 2021-01-07 (×2): qty 100

## 2021-01-07 MED ORDER — DEXAMETHASONE 4 MG PO TABS
ORAL_TABLET | ORAL | Status: AC
  Filled 2021-01-07: qty 1

## 2021-01-07 MED ORDER — CYANOCOBALAMIN 1000 MCG/ML IJ SOLN
1000.0000 ug | Freq: Once | INTRAMUSCULAR | Status: AC
  Administered 2021-01-07: 1000 ug via INTRAMUSCULAR
  Filled 2021-01-07: qty 1

## 2021-01-07 MED ORDER — BORTEZOMIB CHEMO SQ INJECTION 3.5 MG (2.5MG/ML)
1.3000 mg/m2 | Freq: Once | INTRAMUSCULAR | Status: AC
  Administered 2021-01-07: 2.25 mg via SUBCUTANEOUS
  Filled 2021-01-07: qty 0.9

## 2021-01-07 MED ORDER — EPOETIN ALFA-EPBX 20000 UNIT/ML IJ SOLN
20000.0000 [IU] | Freq: Once | INTRAMUSCULAR | Status: AC
  Administered 2021-01-07: 20000 [IU] via SUBCUTANEOUS
  Filled 2021-01-07: qty 1

## 2021-01-07 NOTE — Progress Notes (Signed)
Pt here for velcade.  Potassium 2.5. Per Dr Raliegh Ip order for 40 of PO potassium now, then the run of IV potassium over 2 hours, then another 40 of PO potassium after that  Brittany Archer presents today for injection per the provider's orders.  B12 and retacrit administration without incident; injection site WNL; see MAR for injection details.  Patient tolerated procedure well and without incident.  No questions or complaints noted at this time.  Tolerated IV potassium and velcade today without incidence. Vital signs stable prior to discharge.  Discharged ambulatory in stable condition.

## 2021-01-07 NOTE — Telephone Encounter (Signed)
..  CRITICAL VALUE ALERT Critical value received:  Potassium 2.5 Date of notification:  01/07/21 Time of notification: 10:47 Critical value read back: yes Nurse who received alert:  Melven Sartorius MD notified time and response:  Dr. Delton Coombes notified at 10:48.  Awaiting orders.

## 2021-01-07 NOTE — Telephone Encounter (Signed)
Orders given from Dr. Delton Coombes to address the pt's critical potassium level.  Per Dr. Delton Coombes, give the pt 40 meq of potassium PO, give a run of of 10 meq of potassium in 100 ml IV over 2 hours, then give 40 meq of potassium PO again after IV infusion.  These orders were also given to Joanne Gavel, RN.

## 2021-01-07 NOTE — Patient Instructions (Signed)
[facility] Discharge Instructions for Patients Receiving Chemotherapy  Today you received the following chemotherapy agents   To help prevent nausea and vomiting after your treatment, we encourage you to take your nausea medication   If you develop nausea and vomiting that is not controlled by your nausea medication, call the clinic.   BELOW ARE SYMPTOMS THAT SHOULD BE REPORTED IMMEDIATELY:  *FEVER GREATER THAN 100.5 F  *CHILLS WITH OR WITHOUT FEVER  NAUSEA AND VOMITING THAT IS NOT CONTROLLED WITH YOUR NAUSEA MEDICATION  *UNUSUAL SHORTNESS OF BREATH  *UNUSUAL BRUISING OR BLEEDING  TENDERNESS IN MOUTH AND THROAT WITH OR WITHOUT PRESENCE OF ULCERS  *URINARY PROBLEMS  *BOWEL PROBLEMS  UNUSUAL RASH Items with * indicate a potential emergency and should be followed up as soon as possible.  Feel free to call the clinic should you have any questions or concerns. The clinic phone number is [phone].  Please show the CHEMO ALERT CARD at check-in to the Emergency Department and triage nurse.   

## 2021-01-08 LAB — KAPPA/LAMBDA LIGHT CHAINS
Kappa free light chain: 28 mg/L — ABNORMAL HIGH (ref 3.3–19.4)
Kappa, lambda light chain ratio: 1.74 — ABNORMAL HIGH (ref 0.26–1.65)
Lambda free light chains: 16.1 mg/L (ref 5.7–26.3)

## 2021-01-08 LAB — PROTEIN ELECTROPHORESIS, SERUM
A/G Ratio: 1.2 (ref 0.7–1.7)
Albumin ELP: 3.3 g/dL (ref 2.9–4.4)
Alpha-1-Globulin: 0.2 g/dL (ref 0.0–0.4)
Alpha-2-Globulin: 0.8 g/dL (ref 0.4–1.0)
Beta Globulin: 1 g/dL (ref 0.7–1.3)
Gamma Globulin: 0.8 g/dL (ref 0.4–1.8)
Globulin, Total: 2.7 g/dL (ref 2.2–3.9)
M-Spike, %: 0.4 g/dL — ABNORMAL HIGH
Total Protein ELP: 6 g/dL (ref 6.0–8.5)

## 2021-01-21 ENCOUNTER — Inpatient Hospital Stay (HOSPITAL_COMMUNITY): Payer: Medicare Other

## 2021-01-21 ENCOUNTER — Encounter (HOSPITAL_COMMUNITY): Payer: Self-pay

## 2021-01-21 ENCOUNTER — Other Ambulatory Visit: Payer: Self-pay

## 2021-01-21 VITALS — BP 114/49 | HR 70 | Temp 97.3°F | Resp 18 | Wt 164.6 lb

## 2021-01-21 DIAGNOSIS — Z5112 Encounter for antineoplastic immunotherapy: Secondary | ICD-10-CM | POA: Diagnosis not present

## 2021-01-21 DIAGNOSIS — C9000 Multiple myeloma not having achieved remission: Secondary | ICD-10-CM

## 2021-01-21 DIAGNOSIS — E538 Deficiency of other specified B group vitamins: Secondary | ICD-10-CM

## 2021-01-21 LAB — CBC WITH DIFFERENTIAL/PLATELET
Abs Immature Granulocytes: 0.01 10*3/uL (ref 0.00–0.07)
Basophils Absolute: 0.1 10*3/uL (ref 0.0–0.1)
Basophils Relative: 2 %
Eosinophils Absolute: 0.1 10*3/uL (ref 0.0–0.5)
Eosinophils Relative: 2 %
HCT: 30.6 % — ABNORMAL LOW (ref 36.0–46.0)
Hemoglobin: 9.8 g/dL — ABNORMAL LOW (ref 12.0–15.0)
Immature Granulocytes: 0 %
Lymphocytes Relative: 26 %
Lymphs Abs: 1 10*3/uL (ref 0.7–4.0)
MCH: 33.3 pg (ref 26.0–34.0)
MCHC: 32 g/dL (ref 30.0–36.0)
MCV: 104.1 fL — ABNORMAL HIGH (ref 80.0–100.0)
Monocytes Absolute: 0.3 10*3/uL (ref 0.1–1.0)
Monocytes Relative: 9 %
Neutro Abs: 2.4 10*3/uL (ref 1.7–7.7)
Neutrophils Relative %: 61 %
Platelets: 197 10*3/uL (ref 150–400)
RBC: 2.94 MIL/uL — ABNORMAL LOW (ref 3.87–5.11)
RDW: 16.8 % — ABNORMAL HIGH (ref 11.5–15.5)
WBC: 3.9 10*3/uL — ABNORMAL LOW (ref 4.0–10.5)
nRBC: 1.3 % — ABNORMAL HIGH (ref 0.0–0.2)

## 2021-01-21 LAB — COMPREHENSIVE METABOLIC PANEL
ALT: 10 U/L (ref 0–44)
AST: 12 U/L — ABNORMAL LOW (ref 15–41)
Albumin: 3.6 g/dL (ref 3.5–5.0)
Alkaline Phosphatase: 58 U/L (ref 38–126)
Anion gap: 7 (ref 5–15)
BUN: 18 mg/dL (ref 8–23)
CO2: 24 mmol/L (ref 22–32)
Calcium: 9.4 mg/dL (ref 8.9–10.3)
Chloride: 108 mmol/L (ref 98–111)
Creatinine, Ser: 1.01 mg/dL — ABNORMAL HIGH (ref 0.44–1.00)
GFR, Estimated: 56 mL/min — ABNORMAL LOW (ref >60)
Glucose, Bld: 108 mg/dL — ABNORMAL HIGH (ref 70–99)
Potassium: 3.6 mmol/L (ref 3.5–5.1)
Sodium: 139 mmol/L (ref 135–145)
Total Bilirubin: 0.7 mg/dL (ref 0.3–1.2)
Total Protein: 6.3 g/dL — ABNORMAL LOW (ref 6.5–8.1)

## 2021-01-21 LAB — LACTATE DEHYDROGENASE: LDH: 137 U/L (ref 98–192)

## 2021-01-21 MED ORDER — BORTEZOMIB CHEMO SQ INJECTION 3.5 MG (2.5MG/ML)
1.3000 mg/m2 | Freq: Once | INTRAMUSCULAR | Status: AC
  Administered 2021-01-21: 2.25 mg via SUBCUTANEOUS
  Filled 2021-01-21: qty 0.9

## 2021-01-21 MED ORDER — DEXAMETHASONE 4 MG PO TABS
40.0000 mg | ORAL_TABLET | Freq: Once | ORAL | Status: AC
  Administered 2021-01-21: 40 mg via ORAL
  Filled 2021-01-21: qty 10

## 2021-01-21 MED ORDER — PROCHLORPERAZINE MALEATE 10 MG PO TABS
10.0000 mg | ORAL_TABLET | Freq: Once | ORAL | Status: AC
  Administered 2021-01-21: 10 mg via ORAL
  Filled 2021-01-21: qty 1

## 2021-01-21 MED ORDER — EPOETIN ALFA-EPBX 20000 UNIT/ML IJ SOLN
20000.0000 [IU] | Freq: Once | INTRAMUSCULAR | Status: AC
  Administered 2021-01-21: 20000 [IU] via SUBCUTANEOUS
  Filled 2021-01-21: qty 1

## 2021-01-21 NOTE — Patient Instructions (Signed)
[facility] Discharge Instructions for Patients Receiving Chemotherapy   Beginning January 23rd 2017 lab work for the [facility] will be done in the  Main lab at [facility] on 1st floor. If you have a lab appointment with the [place] please come in thru the  Main Entrance and check in at the main information desk   Today you received the following chemotherapy agents Velcade as well as Retacrit injection. Follow-up as scheduled  To help prevent nausea and vomiting after your treatment, we encourage you to take your nausea medication   If you develop nausea and vomiting, or diarrhea that is not controlled by your medication, call the clinic.  The clinic phone number is [phone]. Office hours are Monday-Friday 8:30am-5:00pm.  BELOW ARE SYMPTOMS THAT SHOULD BE REPORTED IMMEDIATELY:  *FEVER GREATER THAN 101.0 F  *CHILLS WITH OR WITHOUT FEVER  NAUSEA AND VOMITING THAT IS NOT CONTROLLED WITH YOUR NAUSEA MEDICATION  *UNUSUAL SHORTNESS OF BREATH  *UNUSUAL BRUISING OR BLEEDING  TENDERNESS IN MOUTH AND THROAT WITH OR WITHOUT PRESENCE OF ULCERS  *URINARY PROBLEMS  *BOWEL PROBLEMS  UNUSUAL RASH Items with * indicate a potential emergency and should be followed up as soon as possible. If you have an emergency after office hours please contact your primary care physician or go to the nearest emergency department.  Please call the clinic during office hours if you have any questions or concerns.   You may also contact the Patient Navigator at [phone] should you have any questions or need assistance in obtaining follow up care.      Resources For Cancer Patients and their Caregivers ? American Cancer Society: Can assist with transportation, wigs, general needs, runs Look Good Feel Better.        340-735-0721 ? Cancer Care: Provides financial assistance, online support groups, medication/co-pay assistance.  1-800-813-HOPE 804-594-0462) ? [place] Assists [place] cancer patients and their families through emotional , educational and financial support.  505-820-0943 ? [place] DSS Where to apply for food stamps, Medicaid and utility assistance. 539-430-4404 ? [employer]: Transportation to medical appointments. 463-323-1106 ? Social Security Administration: May apply for disability if have a Stage IV cancer. (217)095-1501 (725) 586-9787 ? [employer], Disability and Transit Services: Assists with nutrition, care and transit needs. 786-462-7239

## 2021-01-21 NOTE — Progress Notes (Signed)
Brittany Archer tolerated Velcade and Retacrit injections well without complaints or incident. Labs reviewed prior to administering these medications. Hgb 9.8 today. VSS Pt discharged self ambulatory in satisfactory condition accompanied by her sister

## 2021-01-22 LAB — KAPPA/LAMBDA LIGHT CHAINS
Kappa free light chain: 35.4 mg/L — ABNORMAL HIGH (ref 3.3–19.4)
Kappa, lambda light chain ratio: 1.58 (ref 0.26–1.65)
Lambda free light chains: 22.4 mg/L (ref 5.7–26.3)

## 2021-01-22 LAB — PROTEIN ELECTROPHORESIS, SERUM
A/G Ratio: 1.1 (ref 0.7–1.7)
Albumin ELP: 3.2 g/dL (ref 2.9–4.4)
Alpha-1-Globulin: 0.2 g/dL (ref 0.0–0.4)
Alpha-2-Globulin: 0.8 g/dL (ref 0.4–1.0)
Beta Globulin: 1 g/dL (ref 0.7–1.3)
Gamma Globulin: 0.8 g/dL (ref 0.4–1.8)
Globulin, Total: 2.8 g/dL (ref 2.2–3.9)
Total Protein ELP: 6 g/dL (ref 6.0–8.5)

## 2021-01-28 ENCOUNTER — Other Ambulatory Visit: Payer: Self-pay

## 2021-01-28 ENCOUNTER — Inpatient Hospital Stay (HOSPITAL_COMMUNITY): Payer: Medicare Other

## 2021-01-28 ENCOUNTER — Inpatient Hospital Stay (HOSPITAL_COMMUNITY): Payer: Medicare Other | Attending: Hematology

## 2021-01-28 ENCOUNTER — Inpatient Hospital Stay (HOSPITAL_BASED_OUTPATIENT_CLINIC_OR_DEPARTMENT_OTHER): Payer: Medicare Other | Admitting: Hematology

## 2021-01-28 VITALS — BP 133/70 | HR 68 | Temp 97.2°F | Resp 18 | Wt 162.6 lb

## 2021-01-28 VITALS — BP 125/51 | HR 65 | Temp 97.1°F | Resp 18

## 2021-01-28 DIAGNOSIS — E876 Hypokalemia: Secondary | ICD-10-CM | POA: Insufficient documentation

## 2021-01-28 DIAGNOSIS — C9000 Multiple myeloma not having achieved remission: Secondary | ICD-10-CM | POA: Diagnosis present

## 2021-01-28 DIAGNOSIS — Z5112 Encounter for antineoplastic immunotherapy: Secondary | ICD-10-CM | POA: Diagnosis present

## 2021-01-28 LAB — CBC WITH DIFFERENTIAL/PLATELET
Abs Immature Granulocytes: 0.01 10*3/uL (ref 0.00–0.07)
Basophils Absolute: 0 10*3/uL (ref 0.0–0.1)
Basophils Relative: 0 %
Eosinophils Absolute: 0.1 10*3/uL (ref 0.0–0.5)
Eosinophils Relative: 3 %
HCT: 32.2 % — ABNORMAL LOW (ref 36.0–46.0)
Hemoglobin: 10.3 g/dL — ABNORMAL LOW (ref 12.0–15.0)
Immature Granulocytes: 0 %
Lymphocytes Relative: 25 %
Lymphs Abs: 0.7 10*3/uL (ref 0.7–4.0)
MCH: 33.1 pg (ref 26.0–34.0)
MCHC: 32 g/dL (ref 30.0–36.0)
MCV: 103.5 fL — ABNORMAL HIGH (ref 80.0–100.0)
Monocytes Absolute: 0.2 10*3/uL (ref 0.1–1.0)
Monocytes Relative: 8 %
Neutro Abs: 1.9 10*3/uL (ref 1.7–7.7)
Neutrophils Relative %: 64 %
Platelets: 169 10*3/uL (ref 150–400)
RBC: 3.11 MIL/uL — ABNORMAL LOW (ref 3.87–5.11)
RDW: 16.8 % — ABNORMAL HIGH (ref 11.5–15.5)
WBC: 3 10*3/uL — ABNORMAL LOW (ref 4.0–10.5)
nRBC: 0 % (ref 0.0–0.2)

## 2021-01-28 LAB — COMPREHENSIVE METABOLIC PANEL
ALT: 8 U/L (ref 0–44)
AST: 11 U/L — ABNORMAL LOW (ref 15–41)
Albumin: 3.5 g/dL (ref 3.5–5.0)
Alkaline Phosphatase: 62 U/L (ref 38–126)
Anion gap: 8 (ref 5–15)
BUN: 9 mg/dL (ref 8–23)
CO2: 27 mmol/L (ref 22–32)
Calcium: 8.9 mg/dL (ref 8.9–10.3)
Chloride: 104 mmol/L (ref 98–111)
Creatinine, Ser: 1.05 mg/dL — ABNORMAL HIGH (ref 0.44–1.00)
GFR, Estimated: 54 mL/min — ABNORMAL LOW (ref >60)
Glucose, Bld: 144 mg/dL — ABNORMAL HIGH (ref 70–99)
Potassium: 2.5 mmol/L — CL (ref 3.5–5.1)
Sodium: 139 mmol/L (ref 135–145)
Total Bilirubin: 0.8 mg/dL (ref 0.3–1.2)
Total Protein: 6.2 g/dL — ABNORMAL LOW (ref 6.5–8.1)

## 2021-01-28 MED ORDER — BORTEZOMIB CHEMO SQ INJECTION 3.5 MG (2.5MG/ML)
1.3000 mg/m2 | Freq: Once | INTRAMUSCULAR | Status: AC
  Administered 2021-01-28: 2.25 mg via SUBCUTANEOUS
  Filled 2021-01-28: qty 0.9

## 2021-01-28 MED ORDER — PROCHLORPERAZINE MALEATE 10 MG PO TABS
ORAL_TABLET | ORAL | Status: AC
  Filled 2021-01-28: qty 2

## 2021-01-28 MED ORDER — DEXAMETHASONE 4 MG PO TABS: 40.0000 mg | ORAL_TABLET | Freq: Once | ORAL | Status: DC

## 2021-01-28 MED ORDER — PROCHLORPERAZINE MALEATE 10 MG PO TABS
10.0000 mg | ORAL_TABLET | Freq: Once | ORAL | Status: AC
  Administered 2021-01-28: 10 mg via ORAL

## 2021-01-28 MED ORDER — POTASSIUM CHLORIDE CRYS ER 20 MEQ PO TBCR
EXTENDED_RELEASE_TABLET | ORAL | Status: AC
  Filled 2021-01-28: qty 2

## 2021-01-28 MED ORDER — DEXAMETHASONE 4 MG PO TABS
40.0000 mg | ORAL_TABLET | Freq: Once | ORAL | Status: AC
  Administered 2021-01-28: 40 mg via ORAL

## 2021-01-28 MED ORDER — POTASSIUM CHLORIDE CRYS ER 20 MEQ PO TBCR
40.0000 meq | EXTENDED_RELEASE_TABLET | Freq: Once | ORAL | Status: AC
  Administered 2021-01-28: 40 meq via ORAL

## 2021-01-28 MED ORDER — DEXAMETHASONE 4 MG PO TABS
ORAL_TABLET | ORAL | Status: AC
  Filled 2021-01-28: qty 10

## 2021-01-28 MED ORDER — POTASSIUM CHLORIDE 10 MEQ/100ML IV SOLN
10.0000 meq | INTRAVENOUS | Status: AC
  Administered 2021-01-28 (×2): 10 meq via INTRAVENOUS
  Filled 2021-01-28 (×2): qty 100

## 2021-01-28 NOTE — Patient Instructions (Signed)
[facility] Discharge Instructions for Patients Receiving Chemotherapy  Today you received the following chemotherapy agents   To help prevent nausea and vomiting after your treatment, we encourage you to take your nausea medication   If you develop nausea and vomiting that is not controlled by your nausea medication, call the clinic.   BELOW ARE SYMPTOMS THAT SHOULD BE REPORTED IMMEDIATELY:  *FEVER GREATER THAN 100.5 F  *CHILLS WITH OR WITHOUT FEVER  NAUSEA AND VOMITING THAT IS NOT CONTROLLED WITH YOUR NAUSEA MEDICATION  *UNUSUAL SHORTNESS OF BREATH  *UNUSUAL BRUISING OR BLEEDING  TENDERNESS IN MOUTH AND THROAT WITH OR WITHOUT PRESENCE OF ULCERS  *URINARY PROBLEMS  *BOWEL PROBLEMS  UNUSUAL RASH Items with * indicate a potential emergency and should be followed up as soon as possible.  Feel free to call the clinic should you have any questions or concerns. The clinic phone number is [phone].  Please show the CHEMO ALERT CARD at check-in to the Emergency Department and triage nurse.   

## 2021-01-28 NOTE — Progress Notes (Signed)
Patient presents today for Velcade injection.  Vital signs WNL.  Labs within parameters for treatment.  Potassium noted to be 2.5.  Message received from [place] patient okay to treat.  New orders from [place] to give 33mEq of Potassium PO and 79mEq of Potassium IV.  Patient has no new complaints since last visit.  Message sent to [place] [facility] per family request, for possible home health assistance.  Velcade injection given today per MD orders.  Tolerated injection without adverse affects.  Injection site WNL.  Vital signs stable.  No complaints at this time.  Discharge from clinic ambulatory in stable condition.  Alert and oriented X 3.  Follow up with [facility] as scheduled.

## 2021-01-28 NOTE — Progress Notes (Signed)
CRITICAL VALUE ALERT  Critical Value:  K+ 2.5  Date & Time Notied:  01/28/2021 at [place]  Provider Notified: Dr. Delton Coombes  Orders Received/Actions taken: give KCl 20 mEq IV and K-dur 40 mEq po pre and post infusion

## 2021-01-28 NOTE — Patient Instructions (Signed)
[place] at [facility] Discharge Instructions  You were seen today by Dr. Delton Coombes. He went over your recent results. You received your treatment today. Finish your bottle of Revlimid and take 1 week off before starting your next bottle. Dr. Delton Coombes will see you back in 6 weeks for labs and follow up.   Thank you for choosing [place] at [facility] to provide your oncology and hematology care.  To afford each patient quality time with our provider, please arrive at least 15 minutes before your scheduled appointment time.   If you have a lab appointment with the [facility] please come in thru the Main Entrance and check in at the main information desk  You need to re-schedule your appointment should you arrive 10 or more minutes late.  We strive to give you quality time with our providers, and arriving late affects you and other patients whose appointments are after yours.  Also, if you no show three or more times for appointments you may be dismissed from the clinic at the providers discretion.     Again, thank you for choosing [facility].  Our hope is that these requests will decrease the amount of time that you wait before being seen by our physicians.       _____________________________________________________________  Should you have questions after your visit to [facility], please contact our office at [phone] between the hours of 8:00 a.m. and 4:30 p.m.  Voicemails left after 4:00 p.m. will not be returned until the following business day.  For prescription refill requests, have your pharmacy contact our office and allow 72 hours.    Cancer Center Support Programs:   > Cancer Support Group  2nd Tuesday of the month 1pm-2pm, Journey Room

## 2021-01-28 NOTE — Progress Notes (Signed)
Patient was assessed by Dr. Katragadda and labs have been reviewed. Treatment pending labs. Primary RN and pharmacy aware.   

## 2021-01-28 NOTE — Progress Notes (Signed)
Brittany Archer, [name]   CLINIC:  Medical Oncology/Hematology  PCP:  [name], MD [address] [place] / [place] [address] [phone]   REASON FOR VISIT:  Follow-up for multiple myeloma  PRIOR THERAPY: None  NGS Results: Not done  CURRENT THERAPY: Velcade 3 weeks on, 1 week off; Revlimid 20 mg 2 weeks on, 1 week off  BRIEF ONCOLOGIC HISTORY:  Oncology History  Multiple myeloma not having achieved remission ([facility])  01/20/2018 Initial Diagnosis   Multiple myeloma not having achieved remission ([facility])   01/26/2018 -  Chemotherapy    Patient is on Treatment Plan: MYELOMA  RVD SQ (BORTEZOMIB D 1,8,15 ) Q28D X 4 CYCLES        CANCER STAGING: Cancer Staging No matching staging information was found for the patient.  INTERVAL HISTORY:  [name], a 80 y.o. female, returns for routine follow-up and consideration for next cycle of chemotherapy. [name] was last seen on 12/24/2020.  Due for day #8 of cycle #38 of Velcade today.   Today she is accompanied by her daughter. Overall, she tells me she has been feeling well. She denies having any numbness or tingling or recent sinus infections or F/C. She has one more dose of Revlimid left. Her daughter notes that she is more steadier, eating well and denies being in pain. She denies having numbness or tingling.  She is able to do her chores and ADL's at home.  Overall, she feels ready for next cycle of chemo today.    REVIEW OF SYSTEMS:  Review of Systems  Constitutional: Positive for appetite change (75%) and fatigue (50%). Negative for chills and fever.  Musculoskeletal: Negative for arthralgias and myalgias.  Neurological: Negative for numbness.  All other systems reviewed and are negative.   PAST MEDICAL/SURGICAL HISTORY:  Past Medical History:  Diagnosis Date  . Breast cancer ([facility])    left breast/ 2008/ surg/ rad tx  . Coronary artery disease   .  Diabetes mellitus    Past Surgical History:  Procedure Laterality Date  . ABDOMINAL HYSTERECTOMY    . BREAST SURGERY    . DEBRIDEMENT MANDIBLE N/A 02/22/2020   Procedure: INCISION AND DRAINAGE WITH DEBRIDEMENT MANDIBLE;  Surgeon: Michael Litter, DMD;  Location: [facility];  Service: Oral Surgery;  Laterality: N/A;  . TOOTH EXTRACTION N/A 02/22/2020   Procedure: DENTAL RESTORATION/EXTRACTIONS;  Surgeon: Michael Litter, DMD;  Location: [facility];  Service: Oral Surgery;  Laterality: N/A;  DENTAL KIT REQUESTED    SOCIAL HISTORY:  Social History   Socioeconomic History  . Marital status: Divorced    Spouse name: Not on file  . Number of children: Not on file  . Years of education: Not on file  . Highest education level: Not on file  Occupational History  . Not on file  Tobacco Use  . Smoking status: Never Smoker  . Smokeless tobacco: Never Used  Vaping Use  . Vaping Use: Never used  Substance and Sexual Activity  . Alcohol use: No  . Drug use: No  . Sexual activity: Yes    Birth control/protection: Surgical  Other Topics Concern  . Not on file  Social History Narrative  . Not on file   Social Determinants of Health   Financial Resource Strain: Low Risk   . Difficulty of Paying Living Expenses: Not hard at all  Food Insecurity: No Food Insecurity  . Worried About Charity fundraiser in  the Last Year: Never true  . Ran Out of Food in the Last Year: Never true  Transportation Needs: No Transportation Needs  . Lack of Transportation (Medical): No  . Lack of Transportation (Non-Medical): No  Physical Activity: Inactive  . Days of Exercise per Week: 0 days  . Minutes of Exercise per Session: 0 min  Stress: No Stress Concern Present  . Feeling of Stress : Not at all  Social Connections: Moderately Isolated  . Frequency of Communication with Friends and Family: More than three times a week  . Frequency of Social Gatherings with Friends and Family: More than three times a week  .  Attends Religious Services: More than 4 times per year  . Active Member of Clubs or Organizations: No  . Attends Archivist Meetings: Never  . Marital Status: Divorced  Human resources officer Violence: Not At Risk  . Fear of Current or Ex-Partner: No  . Emotionally Abused: No  . Physically Abused: No  . Sexually Abused: No    FAMILY HISTORY:  Family History  Problem Relation Age of Onset  . Obesity Sister     CURRENT MEDICATIONS:  Current Outpatient Medications  Medication Sig Dispense Refill  . acyclovir (ZOVIRAX) 400 MG tablet TAKE 1 TABLET BY MOUTH TWICE DAILY 60 tablet 11  . aspirin 81 MG tablet Take 81 mg by mouth daily.    . bortezomib IV (VELCADE) 3.5 MG injection Inject 3.5 mg into the vein once a week. weekly    . chlorhexidine (PERIDEX) 0.12 % solution Use as directed 15 mLs in the mouth or throat 2 (two) times daily.     . cholecalciferol (VITAMIN D) 25 MCG (1000 UNIT) tablet 1 capsule    . dexamethasone (DECADRON) 4 MG tablet Take 10 tablets (40 mg) on days 1, 8, and 15 of chemo. Repeat every 21 days. 30 tablet 3  . glipiZIDE (GLUCOTROL) 5 MG tablet Take 5 mg by mouth daily before breakfast.     . glucose blood (ACCU-CHEK AVIVA PLUS) test strip CHECK BLOOD SUGAR ONCE DAILY    . ketoconazole (NIZORAL) 2 % cream Apply topically.    Marland Kitchen lenalidomide (REVLIMID) 20 MG capsule Take 1 capsule by mouth once daily for 14 days on, and 7 days off of a 21 day cycle. 14 capsule 0  . lisinopril-hydrochlorothiazide (PRINZIDE,ZESTORETIC) 20-25 MG tablet Take 1 tablet by mouth every morning.     . magnesium oxide (MAG-OX) 400 (241.3 Mg) MG tablet Take 1 tablet (400 mg total) by mouth in the morning, at noon, and at bedtime. 90 tablet 6  . metFORMIN (GLUCOPHAGE) 1000 MG tablet Take 1,000 mg by mouth 2 times daily at 12 noon and 4 pm.    . ofloxacin (OCUFLOX) 0.3 % ophthalmic solution Place 1 drop into both eyes.     Marland Kitchen ondansetron (ZOFRAN) 8 MG tablet     . potassium chloride SA  (KLOR-CON) 20 MEQ tablet TAKE (2) TABLETS BY MOUTH THREE TIMES DAILY. 168 tablet 11  . prednisoLONE acetate (PRED FORTE) 1 % ophthalmic suspension Place 1 drop into both eyes.     Marland Kitchen prochlorperazine (COMPAZINE) 10 MG tablet      No current facility-administered medications for this visit.    ALLERGIES:  Allergies  Allergen Reactions  . Seasonal Ic [Cholestatin] Other (See Comments)  . Motrin [Ibuprofen] Rash    PHYSICAL EXAM:  Performance status (ECOG): 1 - Symptomatic but completely ambulatory  Vitals:   01/28/21 1007  BP: 133/70  Pulse: 68  Resp: 18  Temp: (!) 97.2 F (36.2 C)  SpO2: 100%   Wt Readings from Last 3 Encounters:  01/28/21 162 lb 9.6 oz (73.8 kg)  01/21/21 164 lb 9.6 oz (74.7 kg)  01/07/21 169 lb (76.7 kg)   Physical Exam Vitals reviewed.  Constitutional:      Appearance: Normal appearance.  Cardiovascular:     Rate and Rhythm: Normal rate and regular rhythm.     Pulses: Normal pulses.     Heart sounds: Normal heart sounds.  Pulmonary:     Effort: Pulmonary effort is normal.     Breath sounds: Normal breath sounds.  Musculoskeletal:     Right lower leg: No edema.     Left lower leg: No edema.  Neurological:     General: No focal deficit present.     Mental Status: She is alert and oriented to person, place, and time.  Psychiatric:        Mood and Affect: Mood normal.        Behavior: Behavior normal.     LABORATORY DATA:  I have reviewed the labs as listed.  CBC Latest Ref Rng & Units 01/28/2021 01/21/2021 01/07/2021  WBC 4.0 - 10.5 K/uL 3.0(L) 3.9(L) 2.8(L)  Hemoglobin 12.0 - 15.0 g/dL 10.3(L) 9.8(L) 9.1(L)  Hematocrit 36.0 - 46.0 % 32.2(L) 30.6(L) 28.6(L)  Platelets 150 - 400 K/uL 169 197 236   CMP Latest Ref Rng & Units 01/21/2021 01/07/2021 12/24/2020  Glucose 70 - 99 mg/dL 108(H) 155(H) 88  BUN 8 - 23 mg/dL _0 Creatinine 0.44 - 1.00 mg/dL 1.01(H) 1.06(H) 1.11(H)  Sodium 135 - 145 mmol/L 139 141 140  Potassium 3.5 - 5.1 mmol/L 3.6  2.5(LL) 2.8(L)  Chloride 98 - 111 mmol/L 108 106 106  CO2 22 - 32 mmol/L _1 Calcium 8.9 - 10.3 mg/dL 9.4 8.5(L) 9.1  Total Protein 6.5 - 8.1 g/dL 6.3(L) 6.4(L) 6.7  Total Bilirubin 0.3 - 1.2 mg/dL 0.7 0.8 0.9  Alkaline Phos 38 - 126 U/L 58 59 56  AST 15 - 41 U/L 12(L) 12(L) 13(L)  ALT 0 - 44 U/L _2 Lab Results  Component Value Date   LDH 137 01/21/2021   LDH 133 01/07/2021   LDH 135 12/09/2020   Lab Results  Component Value Date   TOTALPROTELP 6.0 01/21/2021   ALBUMINELP 3.2 01/21/2021   A1GS 0.2 01/21/2021   A2GS 0.8 01/21/2021   BETS 1.0 01/21/2021   GAMS 0.8 01/21/2021   MSPIKE Not Observed 01/21/2021   SPEI Comment 01/21/2021    Lab Results  Component Value Date   KPAFRELGTCHN 35.4 (H) 01/21/2021   LAMBDASER 22.4 01/21/2021   KAPLAMBRATIO 1.58 01/21/2021    DIAGNOSTIC IMAGING:  I have independently reviewed the scans and discussed with the patient. No results found.   ASSESSMENT:  1. IgA kappa plasma cell myeloma, stage I: -RVD started on 01/09/2018, held since 02/14/2020 due to mandible abscess. -Myeloma labs on 05/14/2020 showed progression with M spike of 0.5 g. -RVD started back on 05/21/2020. -Myeloma labs on 07/10/2020 shows M spike improved to 0.3 g from 0.6 g previously. Free light chain ratio is 1.74 with kappa light chains 28.4. -Myeloma panel from 08/14/2020 shows M spike 0.4 g. Kappa light chains are 24.8 and ratio is 2.23.  2. Osteomyelitis of the right mandible/dental abscess: -Finished IV ceftriaxone on 04/03/2020. Finished oral antibiotics. -We will hold Xgeva indefinitely.   PLAN:  1. IgA kappa plasma cell myeloma, stage I: -She is taking Revlimid 20 mg 2 weeks on/1 week off. -Continue Velcade 3 weeks on 1 week off.  Continue dexamethasone 40 mg weekly. -Reviewed myeloma labs from 01/21/2021.  M spike was not detected.  Free light chain ratio is normal.  Kappa light chains are 35.4. -Recommend continuing the same treatment plan.   Plan to see her back in 6 weeks with repeat myeloma labs.  If they continue to be negative the next 1 or 2 times, will consider discontinuing Velcade.  2. Severe hypokalemia: -She reports that she has been taking potassium 40 mEq 3 times a day. -However potassium today is 2.5.  She will receive both IV and p.o. replacement today.  3. Osteomyelitis of the right mandible/dental abscess: -Bisphosphonates on hold indefinitely.  4. Hypomagnesemia: -Continue magnesium 3 times daily.  5. 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 -Hemoglobin is 10.3.  Continue Retacrit as needed.   Orders placed this encounter:  No orders of the defined types were placed in this encounter.    Derek Jack, MD [place] [phone]   I, Milinda Antis, am acting as a scribe for Dr. Sanda Linger.  I, Derek Jack MD, have reviewed the above documentation for accuracy and completeness, and I agree with the above.

## 2021-01-30 ENCOUNTER — Telehealth: Payer: Self-pay | Admitting: *Deleted

## 2021-01-30 NOTE — Telephone Encounter (Signed)
Pt sister mae does not want know which vaccine her sister had nor the dates. Pt sister said she just need booster and will callback to sch

## 2021-01-31 ENCOUNTER — Other Ambulatory Visit (HOSPITAL_COMMUNITY): Payer: Self-pay

## 2021-02-04 ENCOUNTER — Encounter (HOSPITAL_COMMUNITY): Payer: Self-pay

## 2021-02-04 ENCOUNTER — Encounter (HOSPITAL_COMMUNITY): Payer: Self-pay | Admitting: General Practice

## 2021-02-04 ENCOUNTER — Inpatient Hospital Stay (HOSPITAL_COMMUNITY): Payer: Medicare Other

## 2021-02-04 ENCOUNTER — Other Ambulatory Visit: Payer: Self-pay

## 2021-02-04 VITALS — BP 136/73 | HR 73 | Temp 97.2°F | Resp 17 | Wt 168.6 lb

## 2021-02-04 DIAGNOSIS — Z5112 Encounter for antineoplastic immunotherapy: Secondary | ICD-10-CM | POA: Diagnosis not present

## 2021-02-04 DIAGNOSIS — C9000 Multiple myeloma not having achieved remission: Secondary | ICD-10-CM

## 2021-02-04 LAB — COMPREHENSIVE METABOLIC PANEL
ALT: 9 U/L (ref 0–44)
AST: 11 U/L — ABNORMAL LOW (ref 15–41)
Albumin: 3.3 g/dL — ABNORMAL LOW (ref 3.5–5.0)
Alkaline Phosphatase: 60 U/L (ref 38–126)
Anion gap: 6 (ref 5–15)
BUN: 19 mg/dL (ref 8–23)
CO2: 25 mmol/L (ref 22–32)
Calcium: 8.9 mg/dL (ref 8.9–10.3)
Chloride: 108 mmol/L (ref 98–111)
Creatinine, Ser: 0.97 mg/dL (ref 0.44–1.00)
GFR, Estimated: 59 mL/min — ABNORMAL LOW (ref >60)
Glucose, Bld: 89 mg/dL (ref 70–99)
Potassium: 3.7 mmol/L (ref 3.5–5.1)
Sodium: 139 mmol/L (ref 135–145)
Total Bilirubin: 0.9 mg/dL (ref 0.3–1.2)
Total Protein: 6 g/dL — ABNORMAL LOW (ref 6.5–8.1)

## 2021-02-04 LAB — LACTATE DEHYDROGENASE: LDH: 133 U/L (ref 98–192)

## 2021-02-04 LAB — CBC WITH DIFFERENTIAL/PLATELET
Abs Immature Granulocytes: 0.01 10*3/uL (ref 0.00–0.07)
Basophils Absolute: 0 10*3/uL (ref 0.0–0.1)
Basophils Relative: 0 %
Eosinophils Absolute: 0.1 10*3/uL (ref 0.0–0.5)
Eosinophils Relative: 2 %
HCT: 31.6 % — ABNORMAL LOW (ref 36.0–46.0)
Hemoglobin: 10 g/dL — ABNORMAL LOW (ref 12.0–15.0)
Immature Granulocytes: 0 %
Lymphocytes Relative: 21 %
Lymphs Abs: 0.7 10*3/uL (ref 0.7–4.0)
MCH: 33.3 pg (ref 26.0–34.0)
MCHC: 31.6 g/dL (ref 30.0–36.0)
MCV: 105.3 fL — ABNORMAL HIGH (ref 80.0–100.0)
Monocytes Absolute: 0.5 10*3/uL (ref 0.1–1.0)
Monocytes Relative: 14 %
Neutro Abs: 2.1 10*3/uL (ref 1.7–7.7)
Neutrophils Relative %: 63 %
Platelets: 133 10*3/uL — ABNORMAL LOW (ref 150–400)
RBC: 3 MIL/uL — ABNORMAL LOW (ref 3.87–5.11)
RDW: 16 % — ABNORMAL HIGH (ref 11.5–15.5)
WBC: 3.3 10*3/uL — ABNORMAL LOW (ref 4.0–10.5)
nRBC: 0 % (ref 0.0–0.2)

## 2021-02-04 MED ORDER — BORTEZOMIB CHEMO SQ INJECTION 3.5 MG (2.5MG/ML)
1.3000 mg/m2 | Freq: Once | INTRAMUSCULAR | Status: AC
  Administered 2021-02-04: 2.25 mg via SUBCUTANEOUS
  Filled 2021-02-04: qty 0.9

## 2021-02-04 MED ORDER — DEXAMETHASONE 4 MG PO TABS
40.0000 mg | ORAL_TABLET | Freq: Once | ORAL | Status: AC
  Administered 2021-02-04: 40 mg via ORAL
  Filled 2021-02-04: qty 10

## 2021-02-04 MED ORDER — PROCHLORPERAZINE MALEATE 10 MG PO TABS
10.0000 mg | ORAL_TABLET | Freq: Once | ORAL | Status: AC
  Administered 2021-02-04: 10 mg via ORAL
  Filled 2021-02-04: qty 1

## 2021-02-04 NOTE — Progress Notes (Signed)
Patient presents today for treatment. Labs within parameters for treatment today. Vital signs within parameters for treatment today. Potassium 3.7. Patient has no complaints of any changes since her last visit. Patient denies any pain today.   Velcade given today per MD orders. Tolerated infusion without adverse affects. Vital signs stable. No complaints at this time. Discharged from clinic ambulatory in stable condition. Alert and oriented x 3. F/U with [facility] as scheduled.

## 2021-02-04 NOTE — Progress Notes (Signed)
[facility] CSW Progress Notes  Spoke w sister, Kathi Der.  She clarified that patient's issue w medication adherence is primarily with the potassium pills.  These are large pills taken 3 times/day and patient resists taking them.  Family is diligent in monitoring medication schedule, patient dislikes the large pills.  Discussed w nursing staff - pharmacy will try to find a more palatable form of potassium supplement (liquid, sprinkles or similar) in order to make taking the medication easier.  Called sister to update on the plan.  Edwyna Shell, LCSW Clinical Social Worker Phone:  [phone]

## 2021-02-04 NOTE — Patient Instructions (Signed)
[facility] Discharge Instructions for Patients Receiving Chemotherapy  Today you received the following chemotherapy agents   To help prevent nausea and vomiting after your treatment, we encourage you to take your nausea medication   If you develop nausea and vomiting that is not controlled by your nausea medication, call the clinic.   BELOW ARE SYMPTOMS THAT SHOULD BE REPORTED IMMEDIATELY:  *FEVER GREATER THAN 100.5 F  *CHILLS WITH OR WITHOUT FEVER  NAUSEA AND VOMITING THAT IS NOT CONTROLLED WITH YOUR NAUSEA MEDICATION  *UNUSUAL SHORTNESS OF BREATH  *UNUSUAL BRUISING OR BLEEDING  TENDERNESS IN MOUTH AND THROAT WITH OR WITHOUT PRESENCE OF ULCERS  *URINARY PROBLEMS  *BOWEL PROBLEMS  UNUSUAL RASH Items with * indicate a potential emergency and should be followed up as soon as possible.  Feel free to call the clinic should you have any questions or concerns. The clinic phone number is [phone].  Please show the CHEMO ALERT CARD at check-in to the Emergency Department and triage nurse.   

## 2021-02-05 ENCOUNTER — Other Ambulatory Visit (HOSPITAL_COMMUNITY): Payer: Medicare Other

## 2021-02-06 ENCOUNTER — Inpatient Hospital Stay (HOSPITAL_COMMUNITY): Admission: RE | Admit: 2021-02-06 | Payer: Medicare Other | Source: Ambulatory Visit

## 2021-02-07 ENCOUNTER — Other Ambulatory Visit (HOSPITAL_COMMUNITY): Payer: Self-pay | Admitting: *Deleted

## 2021-02-07 DIAGNOSIS — C9000 Multiple myeloma not having achieved remission: Secondary | ICD-10-CM

## 2021-02-07 MED ORDER — LENALIDOMIDE 20 MG PO CAPS: ORAL_CAPSULE | ORAL | 0 refills | Status: DC

## 2021-02-10 ENCOUNTER — Ambulatory Visit (HOSPITAL_COMMUNITY): Admission: RE | Admit: 2021-02-10 | Payer: Medicare Other | Source: Home / Self Care | Admitting: Oral Surgery

## 2021-02-10 ENCOUNTER — Encounter (HOSPITAL_COMMUNITY): Admission: RE | Payer: Self-pay | Source: Home / Self Care

## 2021-02-10 SURGERY — DEBRIDEMENT, MANDIBLE
Anesthesia: General | Laterality: Right

## 2021-02-11 ENCOUNTER — Other Ambulatory Visit (HOSPITAL_COMMUNITY): Payer: Self-pay

## 2021-02-18 ENCOUNTER — Inpatient Hospital Stay (HOSPITAL_COMMUNITY): Payer: Medicare Other

## 2021-02-18 ENCOUNTER — Other Ambulatory Visit: Payer: Self-pay

## 2021-02-18 ENCOUNTER — Ambulatory Visit (HOSPITAL_COMMUNITY): Payer: Medicare Other

## 2021-02-18 ENCOUNTER — Other Ambulatory Visit (HOSPITAL_COMMUNITY): Payer: Medicare Other

## 2021-02-18 VITALS — BP 123/49 | HR 78 | Temp 97.1°F | Resp 18 | Wt 168.4 lb

## 2021-02-18 DIAGNOSIS — Z5112 Encounter for antineoplastic immunotherapy: Secondary | ICD-10-CM | POA: Diagnosis not present

## 2021-02-18 DIAGNOSIS — C9000 Multiple myeloma not having achieved remission: Secondary | ICD-10-CM

## 2021-02-18 DIAGNOSIS — E538 Deficiency of other specified B group vitamins: Secondary | ICD-10-CM

## 2021-02-18 LAB — CBC WITH DIFFERENTIAL/PLATELET
Abs Immature Granulocytes: 0.01 10*3/uL (ref 0.00–0.07)
Basophils Absolute: 0 10*3/uL (ref 0.0–0.1)
Basophils Relative: 1 %
Eosinophils Absolute: 0.1 10*3/uL (ref 0.0–0.5)
Eosinophils Relative: 2 %
HCT: 31.5 % — ABNORMAL LOW (ref 36.0–46.0)
Hemoglobin: 10.1 g/dL — ABNORMAL LOW (ref 12.0–15.0)
Immature Granulocytes: 0 %
Lymphocytes Relative: 23 %
Lymphs Abs: 0.8 10*3/uL (ref 0.7–4.0)
MCH: 33.4 pg (ref 26.0–34.0)
MCHC: 32.1 g/dL (ref 30.0–36.0)
MCV: 104.3 fL — ABNORMAL HIGH (ref 80.0–100.0)
Monocytes Absolute: 0.2 10*3/uL (ref 0.1–1.0)
Monocytes Relative: 7 %
Neutro Abs: 2.5 10*3/uL (ref 1.7–7.7)
Neutrophils Relative %: 67 %
Platelets: 215 10*3/uL (ref 150–400)
RBC: 3.02 MIL/uL — ABNORMAL LOW (ref 3.87–5.11)
RDW: 15.5 % (ref 11.5–15.5)
WBC: 3.7 10*3/uL — ABNORMAL LOW (ref 4.0–10.5)
nRBC: 0 % (ref 0.0–0.2)

## 2021-02-18 LAB — COMPREHENSIVE METABOLIC PANEL
ALT: 10 U/L (ref 0–44)
AST: 12 U/L — ABNORMAL LOW (ref 15–41)
Albumin: 3.7 g/dL (ref 3.5–5.0)
Alkaline Phosphatase: 52 U/L (ref 38–126)
Anion gap: 8 (ref 5–15)
BUN: 20 mg/dL (ref 8–23)
CO2: 27 mmol/L (ref 22–32)
Calcium: 9.9 mg/dL (ref 8.9–10.3)
Chloride: 106 mmol/L (ref 98–111)
Creatinine, Ser: 1.01 mg/dL — ABNORMAL HIGH (ref 0.44–1.00)
GFR, Estimated: 56 mL/min — ABNORMAL LOW (ref >60)
Glucose, Bld: 74 mg/dL (ref 70–99)
Potassium: 3.4 mmol/L — ABNORMAL LOW (ref 3.5–5.1)
Sodium: 141 mmol/L (ref 135–145)
Total Bilirubin: 0.8 mg/dL (ref 0.3–1.2)
Total Protein: 6.7 g/dL (ref 6.5–8.1)

## 2021-02-18 LAB — LACTATE DEHYDROGENASE: LDH: 151 U/L (ref 98–192)

## 2021-02-18 MED ORDER — POTASSIUM CHLORIDE CRYS ER 20 MEQ PO TBCR
20.0000 meq | EXTENDED_RELEASE_TABLET | Freq: Once | ORAL | Status: AC
  Administered 2021-02-18: 20 meq via ORAL

## 2021-02-18 MED ORDER — POTASSIUM CHLORIDE CRYS ER 20 MEQ PO TBCR
EXTENDED_RELEASE_TABLET | ORAL | Status: AC
  Filled 2021-02-18: qty 1

## 2021-02-18 MED ORDER — EPOETIN ALFA-EPBX 20000 UNIT/ML IJ SOLN
INTRAMUSCULAR | Status: AC
  Filled 2021-02-18: qty 1

## 2021-02-18 MED ORDER — EPOETIN ALFA-EPBX 20000 UNIT/ML IJ SOLN: 20000.0000 [IU] | Freq: Once | INTRAMUSCULAR | Status: DC

## 2021-02-18 MED ORDER — DEXAMETHASONE 4 MG PO TABS
40.0000 mg | ORAL_TABLET | Freq: Once | ORAL | Status: AC
  Administered 2021-02-18: 40 mg via ORAL

## 2021-02-18 MED ORDER — PROCHLORPERAZINE MALEATE 10 MG PO TABS
ORAL_TABLET | ORAL | Status: AC
  Filled 2021-02-18: qty 1

## 2021-02-18 MED ORDER — BORTEZOMIB CHEMO SQ INJECTION 3.5 MG (2.5MG/ML)
1.3000 mg/m2 | Freq: Once | INTRAMUSCULAR | Status: AC
Start: 2021-02-18 — End: 2021-02-18
  Administered 2021-02-18: 2.25 mg via SUBCUTANEOUS
  Filled 2021-02-18: qty 0.9

## 2021-02-18 MED ORDER — PROCHLORPERAZINE MALEATE 10 MG PO TABS
10.0000 mg | ORAL_TABLET | Freq: Once | ORAL | Status: AC
  Administered 2021-02-18: 10 mg via ORAL

## 2021-02-18 MED ORDER — DEXAMETHASONE 4 MG PO TABS
ORAL_TABLET | ORAL | Status: AC
  Filled 2021-02-18: qty 10

## 2021-02-18 NOTE — Progress Notes (Signed)
Patient presents today for Velcade injection. Labs within parameters for today's treatment. Vital signs stable. Potassium today 3.4. Message sent to MD to review labs.   Message received from Dr. Dannielle Burn to proceed with treatment today. Labs reviewed. Order received to give 20 mEq of Potassium PO x 1 dose today. HGB 10.1 today. NO Retacrit given.   Velcade given today per MD orders. Tolerated infusion without adverse affects. Vital signs stable. No complaints at this time. Discharged from clinic ambulatory in stable condition. Alert and oriented x 3. [facility] as scheduled.

## 2021-02-18 NOTE — Patient Instructions (Signed)
[facility] Discharge Instructions for Patients Receiving Chemotherapy  Today you received the following chemotherapy agents   To help prevent nausea and vomiting after your treatment, we encourage you to take your nausea medication   If you develop nausea and vomiting that is not controlled by your nausea medication, call the clinic.   BELOW ARE SYMPTOMS THAT SHOULD BE REPORTED IMMEDIATELY:  *FEVER GREATER THAN 100.5 F  *CHILLS WITH OR WITHOUT FEVER  NAUSEA AND VOMITING THAT IS NOT CONTROLLED WITH YOUR NAUSEA MEDICATION  *UNUSUAL SHORTNESS OF BREATH  *UNUSUAL BRUISING OR BLEEDING  TENDERNESS IN MOUTH AND THROAT WITH OR WITHOUT PRESENCE OF ULCERS  *URINARY PROBLEMS  *BOWEL PROBLEMS  UNUSUAL RASH Items with * indicate a potential emergency and should be followed up as soon as possible.  Feel free to call the clinic should you have any questions or concerns. The clinic phone number is [phone].  Please show the CHEMO ALERT CARD at check-in to the Emergency Department and triage nurse.   

## 2021-02-19 LAB — PROTEIN ELECTROPHORESIS, SERUM
A/G Ratio: 1.2 (ref 0.7–1.7)
Albumin ELP: 3.4 g/dL (ref 2.9–4.4)
Alpha-1-Globulin: 0.2 g/dL (ref 0.0–0.4)
Alpha-2-Globulin: 0.8 g/dL (ref 0.4–1.0)
Beta Globulin: 1 g/dL (ref 0.7–1.3)
Gamma Globulin: 0.8 g/dL (ref 0.4–1.8)
Globulin, Total: 2.8 g/dL (ref 2.2–3.9)
M-Spike, %: 0.4 g/dL — ABNORMAL HIGH
Total Protein ELP: 6.2 g/dL (ref 6.0–8.5)

## 2021-02-19 LAB — KAPPA/LAMBDA LIGHT CHAINS
Kappa free light chain: 27.6 mg/L — ABNORMAL HIGH (ref 3.3–19.4)
Kappa, lambda light chain ratio: 1.86 — ABNORMAL HIGH (ref 0.26–1.65)
Lambda free light chains: 14.8 mg/L (ref 5.7–26.3)

## 2021-02-21 LAB — IMMUNOFIXATION ELECTROPHORESIS
IgA: 267 mg/dL (ref 64–422)
IgG (Immunoglobin G), Serum: 791 mg/dL (ref 586–1602)
IgM (Immunoglobulin M), Srm: 109 mg/dL (ref 26–217)
Total Protein ELP: 6.2 g/dL (ref 6.0–8.5)

## 2021-02-25 ENCOUNTER — Ambulatory Visit (HOSPITAL_COMMUNITY): Payer: Medicare Other

## 2021-02-25 ENCOUNTER — Inpatient Hospital Stay (HOSPITAL_COMMUNITY): Payer: Medicare Other

## 2021-02-25 ENCOUNTER — Other Ambulatory Visit: Payer: Self-pay

## 2021-02-25 ENCOUNTER — Encounter (HOSPITAL_COMMUNITY): Payer: Self-pay

## 2021-02-25 ENCOUNTER — Inpatient Hospital Stay (HOSPITAL_COMMUNITY): Payer: Medicare Other | Attending: Hematology

## 2021-02-25 VITALS — BP 129/58 | HR 82 | Temp 96.8°F | Resp 18 | Wt 166.8 lb

## 2021-02-25 DIAGNOSIS — E538 Deficiency of other specified B group vitamins: Secondary | ICD-10-CM

## 2021-02-25 DIAGNOSIS — C9000 Multiple myeloma not having achieved remission: Secondary | ICD-10-CM | POA: Insufficient documentation

## 2021-02-25 DIAGNOSIS — N189 Chronic kidney disease, unspecified: Secondary | ICD-10-CM | POA: Diagnosis not present

## 2021-02-25 DIAGNOSIS — Z5112 Encounter for antineoplastic immunotherapy: Secondary | ICD-10-CM | POA: Diagnosis not present

## 2021-02-25 DIAGNOSIS — D631 Anemia in chronic kidney disease: Secondary | ICD-10-CM | POA: Insufficient documentation

## 2021-02-25 LAB — CBC WITH DIFFERENTIAL/PLATELET
Abs Immature Granulocytes: 0.02 10*3/uL (ref 0.00–0.07)
Basophils Absolute: 0 10*3/uL (ref 0.0–0.1)
Basophils Relative: 0 %
Eosinophils Absolute: 0.1 10*3/uL (ref 0.0–0.5)
Eosinophils Relative: 2 %
HCT: 31.1 % — ABNORMAL LOW (ref 36.0–46.0)
Hemoglobin: 9.9 g/dL — ABNORMAL LOW (ref 12.0–15.0)
Immature Granulocytes: 1 %
Lymphocytes Relative: 27 %
Lymphs Abs: 1.1 10*3/uL (ref 0.7–4.0)
MCH: 33.4 pg (ref 26.0–34.0)
MCHC: 31.8 g/dL (ref 30.0–36.0)
MCV: 105.1 fL — ABNORMAL HIGH (ref 80.0–100.0)
Monocytes Absolute: 0.5 10*3/uL (ref 0.1–1.0)
Monocytes Relative: 11 %
Neutro Abs: 2.5 10*3/uL (ref 1.7–7.7)
Neutrophils Relative %: 59 %
Platelets: 150 10*3/uL (ref 150–400)
RBC: 2.96 MIL/uL — ABNORMAL LOW (ref 3.87–5.11)
RDW: 15.1 % (ref 11.5–15.5)
WBC: 4.2 10*3/uL (ref 4.0–10.5)
nRBC: 0 % (ref 0.0–0.2)

## 2021-02-25 LAB — COMPREHENSIVE METABOLIC PANEL
ALT: 10 U/L (ref 0–44)
AST: 13 U/L — ABNORMAL LOW (ref 15–41)
Albumin: 3.6 g/dL (ref 3.5–5.0)
Alkaline Phosphatase: 65 U/L (ref 38–126)
Anion gap: 10 (ref 5–15)
BUN: 27 mg/dL — ABNORMAL HIGH (ref 8–23)
CO2: 26 mmol/L (ref 22–32)
Calcium: 9.8 mg/dL (ref 8.9–10.3)
Chloride: 105 mmol/L (ref 98–111)
Creatinine, Ser: 1.03 mg/dL — ABNORMAL HIGH (ref 0.44–1.00)
GFR, Estimated: 55 mL/min — ABNORMAL LOW (ref >60)
Glucose, Bld: 158 mg/dL — ABNORMAL HIGH (ref 70–99)
Potassium: 3.6 mmol/L (ref 3.5–5.1)
Sodium: 141 mmol/L (ref 135–145)
Total Bilirubin: 0.4 mg/dL (ref 0.3–1.2)
Total Protein: 6.4 g/dL — ABNORMAL LOW (ref 6.5–8.1)

## 2021-02-25 MED ORDER — EPOETIN ALFA-EPBX 20000 UNIT/ML IJ SOLN
INTRAMUSCULAR | Status: AC
  Filled 2021-02-25: qty 1

## 2021-02-25 MED ORDER — DEXAMETHASONE 4 MG PO TABS
40.0000 mg | ORAL_TABLET | Freq: Once | ORAL | Status: AC
  Administered 2021-02-25: 40 mg via ORAL

## 2021-02-25 MED ORDER — PROCHLORPERAZINE MALEATE 10 MG PO TABS
10.0000 mg | ORAL_TABLET | Freq: Once | ORAL | Status: AC
  Administered 2021-02-25: 10 mg via ORAL

## 2021-02-25 MED ORDER — DEXAMETHASONE 4 MG PO TABS
ORAL_TABLET | ORAL | Status: AC
  Filled 2021-02-25: qty 10

## 2021-02-25 MED ORDER — CYANOCOBALAMIN 1000 MCG/ML IJ SOLN
INTRAMUSCULAR | Status: AC
  Filled 2021-02-25: qty 1

## 2021-02-25 MED ORDER — CYANOCOBALAMIN 1000 MCG/ML IJ SOLN
1000.0000 ug | Freq: Once | INTRAMUSCULAR | Status: AC
  Administered 2021-02-25: 1000 ug via INTRAMUSCULAR

## 2021-02-25 MED ORDER — PROCHLORPERAZINE MALEATE 10 MG PO TABS
ORAL_TABLET | ORAL | Status: AC
  Filled 2021-02-25: qty 1

## 2021-02-25 MED ORDER — EPOETIN ALFA-EPBX 20000 UNIT/ML IJ SOLN
20000.0000 [IU] | Freq: Once | INTRAMUSCULAR | Status: AC
  Administered 2021-02-25: 20000 [IU] via SUBCUTANEOUS

## 2021-02-25 MED ORDER — BORTEZOMIB CHEMO SQ INJECTION 3.5 MG (2.5MG/ML)
1.3000 mg/m2 | Freq: Once | INTRAMUSCULAR | Status: AC
  Administered 2021-02-25: 2.25 mg via SUBCUTANEOUS
  Filled 2021-02-25: qty 0.9

## 2021-02-25 NOTE — Progress Notes (Signed)
Treatment given today per MD orders. Tolerated without adverse affects. Vital signs stable. No complaints at this time. Discharged from clinic ambulatory in stable condition. Alert and oriented x 3. F/U with [facility] as scheduled.   

## 2021-02-25 NOTE — Patient Instructions (Signed)
[facility] Discharge Instructions for Patients Receiving Chemotherapy  Today you received the following chemotherapy agents   To help prevent nausea and vomiting after your treatment, we encourage you to take your nausea medication   If you develop nausea and vomiting that is not controlled by your nausea medication, call the clinic.   BELOW ARE SYMPTOMS THAT SHOULD BE REPORTED IMMEDIATELY:  *FEVER GREATER THAN 100.5 F  *CHILLS WITH OR WITHOUT FEVER  NAUSEA AND VOMITING THAT IS NOT CONTROLLED WITH YOUR NAUSEA MEDICATION  *UNUSUAL SHORTNESS OF BREATH  *UNUSUAL BRUISING OR BLEEDING  TENDERNESS IN MOUTH AND THROAT WITH OR WITHOUT PRESENCE OF ULCERS  *URINARY PROBLEMS  *BOWEL PROBLEMS  UNUSUAL RASH Items with * indicate a potential emergency and should be followed up as soon as possible.  Feel free to call the clinic should you have any questions or concerns. The clinic phone number is [phone].  Please show the CHEMO ALERT CARD at check-in to the Emergency Department and triage nurse.   

## 2021-03-04 ENCOUNTER — Other Ambulatory Visit (HOSPITAL_COMMUNITY): Payer: Medicare Other

## 2021-03-04 ENCOUNTER — Ambulatory Visit (HOSPITAL_COMMUNITY): Payer: Medicare Other

## 2021-03-04 ENCOUNTER — Inpatient Hospital Stay (HOSPITAL_COMMUNITY): Payer: Medicare Other

## 2021-03-06 ENCOUNTER — Other Ambulatory Visit (HOSPITAL_COMMUNITY): Payer: Self-pay

## 2021-03-06 DIAGNOSIS — C9000 Multiple myeloma not having achieved remission: Secondary | ICD-10-CM

## 2021-03-06 MED ORDER — LENALIDOMIDE 20 MG PO CAPS: ORAL_CAPSULE | ORAL | 0 refills | Status: DC

## 2021-03-07 ENCOUNTER — Inpatient Hospital Stay (HOSPITAL_COMMUNITY): Payer: Medicare Other

## 2021-03-07 ENCOUNTER — Other Ambulatory Visit: Payer: Self-pay

## 2021-03-07 VITALS — BP 123/57 | HR 65 | Temp 98.1°F | Resp 18

## 2021-03-07 DIAGNOSIS — Z5112 Encounter for antineoplastic immunotherapy: Secondary | ICD-10-CM | POA: Diagnosis not present

## 2021-03-07 DIAGNOSIS — E538 Deficiency of other specified B group vitamins: Secondary | ICD-10-CM

## 2021-03-07 DIAGNOSIS — C9000 Multiple myeloma not having achieved remission: Secondary | ICD-10-CM

## 2021-03-07 LAB — COMPREHENSIVE METABOLIC PANEL
ALT: 10 U/L (ref 0–44)
AST: 12 U/L — ABNORMAL LOW (ref 15–41)
Albumin: 3.5 g/dL (ref 3.5–5.0)
Alkaline Phosphatase: 56 U/L (ref 38–126)
Anion gap: 8 (ref 5–15)
BUN: 27 mg/dL — ABNORMAL HIGH (ref 8–23)
CO2: 23 mmol/L (ref 22–32)
Calcium: 9.3 mg/dL (ref 8.9–10.3)
Chloride: 109 mmol/L (ref 98–111)
Creatinine, Ser: 1.18 mg/dL — ABNORMAL HIGH (ref 0.44–1.00)
GFR, Estimated: 47 mL/min — ABNORMAL LOW (ref >60)
Glucose, Bld: 77 mg/dL (ref 70–99)
Potassium: 3.4 mmol/L — ABNORMAL LOW (ref 3.5–5.1)
Sodium: 140 mmol/L (ref 135–145)
Total Bilirubin: 0.7 mg/dL (ref 0.3–1.2)
Total Protein: 6.3 g/dL — ABNORMAL LOW (ref 6.5–8.1)

## 2021-03-07 LAB — CBC WITH DIFFERENTIAL/PLATELET
Abs Immature Granulocytes: 0.02 10*3/uL (ref 0.00–0.07)
Basophils Absolute: 0 10*3/uL (ref 0.0–0.1)
Basophils Relative: 0 %
Eosinophils Absolute: 0.1 10*3/uL (ref 0.0–0.5)
Eosinophils Relative: 2 %
HCT: 30.2 % — ABNORMAL LOW (ref 36.0–46.0)
Hemoglobin: 9.8 g/dL — ABNORMAL LOW (ref 12.0–15.0)
Immature Granulocytes: 0 %
Lymphocytes Relative: 17 %
Lymphs Abs: 0.8 10*3/uL (ref 0.7–4.0)
MCH: 33.7 pg (ref 26.0–34.0)
MCHC: 32.5 g/dL (ref 30.0–36.0)
MCV: 103.8 fL — ABNORMAL HIGH (ref 80.0–100.0)
Monocytes Absolute: 0.4 10*3/uL (ref 0.1–1.0)
Monocytes Relative: 8 %
Neutro Abs: 3.3 10*3/uL (ref 1.7–7.7)
Neutrophils Relative %: 73 %
Platelets: 156 10*3/uL (ref 150–400)
RBC: 2.91 MIL/uL — ABNORMAL LOW (ref 3.87–5.11)
RDW: 15.4 % (ref 11.5–15.5)
WBC: 4.5 10*3/uL (ref 4.0–10.5)
nRBC: 0 % (ref 0.0–0.2)

## 2021-03-07 MED ORDER — PROCHLORPERAZINE MALEATE 10 MG PO TABS
10.0000 mg | ORAL_TABLET | Freq: Once | ORAL | Status: AC
Start: 2021-03-07 — End: 2021-03-07
  Administered 2021-03-07: 10 mg via ORAL

## 2021-03-07 MED ORDER — BORTEZOMIB CHEMO SQ INJECTION 3.5 MG (2.5MG/ML)
1.3000 mg/m2 | Freq: Once | INTRAMUSCULAR | Status: AC
  Administered 2021-03-07: 2.25 mg via SUBCUTANEOUS
  Filled 2021-03-07: qty 0.9

## 2021-03-07 MED ORDER — DEXAMETHASONE 4 MG PO TABS
ORAL_TABLET | ORAL | Status: AC
  Filled 2021-03-07: qty 10

## 2021-03-07 MED ORDER — PROCHLORPERAZINE MALEATE 10 MG PO TABS
ORAL_TABLET | ORAL | Status: AC
  Filled 2021-03-07: qty 1

## 2021-03-07 MED ORDER — DEXAMETHASONE 4 MG PO TABS
40.0000 mg | ORAL_TABLET | Freq: Once | ORAL | Status: AC
  Administered 2021-03-07: 40 mg via ORAL

## 2021-03-07 MED ORDER — EPOETIN ALFA-EPBX 20000 UNIT/ML IJ SOLN
20000.0000 [IU] | Freq: Once | INTRAMUSCULAR | Status: AC
  Administered 2021-03-07: 20000 [IU] via SUBCUTANEOUS
  Filled 2021-03-07: qty 1

## 2021-03-07 NOTE — Patient Instructions (Signed)
Bortezomib injection What is this medicine? BORTEZOMIB (bor TEZ oh mib) targets proteins in cancer cells and stops the cancer cells from growing. It treats multiple myeloma and mantle cell lymphoma. This medicine may be used for other purposes; ask your health care provider or pharmacist if you have questions. COMMON BRAND NAME(S): Velcade What should I tell my health care provider before I take this medicine? They need to know if you have any of these conditions:  dehydration  diabetes (high blood sugar)  heart disease  liver disease  tingling of the fingers or toes or other nerve disorder  an unusual or allergic reaction to bortezomib, mannitol, boron, other medicines, foods, dyes, or preservatives  pregnant or trying to get pregnant  breast-feeding How should I use this medicine? This medicine is injected into a vein or under the skin. It is given by a health care provider in a hospital or clinic setting. Talk to your health care provider about the use of this medicine in children. Special care may be needed. Overdosage: If you think you have taken too much of this medicine contact a poison control center or emergency room at once. NOTE: This medicine is only for you. Do not share this medicine with others. What if I miss a dose? Keep appointments for follow-up doses. It is important not to miss your dose. Call your health care provider if you are unable to keep an appointment. What may interact with this medicine? This medicine may interact with the following medications:  ketoconazole  rifampin This list may not describe all possible interactions. Give your health care provider a list of all the medicines, herbs, non-prescription drugs, or dietary supplements you use. Also tell them if you smoke, drink alcohol, or use illegal drugs. Some items may interact with your medicine. What should I watch for while using this medicine? Your condition will be monitored carefully while  you are receiving this medicine. You may need blood work done while you are taking this medicine. You may get drowsy or dizzy. Do not drive, use machinery, or do anything that needs mental alertness until you know how this medicine affects you. Do not stand up or sit up quickly, especially if you are an older patient. This reduces the risk of dizzy or fainting spells This medicine may increase your risk of getting an infection. Call your health care provider for advice if you get a fever, chills, sore throat, or other symptoms of a cold or flu. Do not treat yourself. Try to avoid being around people who are sick. Check with your health care provider if you have severe diarrhea, nausea, and vomiting, or if you sweat a lot. The loss of too much body fluid may make it dangerous for you to take this medicine. Do not become pregnant while taking this medicine or for 7 months after stopping it. Women should inform their health care provider if they wish to become pregnant or think they might be pregnant. Men should not father a child while taking this medicine and for 4 months after stopping it. There is a potential for serious harm to an unborn child. Talk to your health care provider for more information. Do not breast-feed an infant while taking this medicine or for 2 months after stopping it. This medicine may make it more difficult to get pregnant or father a child. Talk to your health care provider if you are concerned about your fertility. What side effects may I notice from receiving this medicine?   Side effects that you should report to your doctor or health care professional as soon as possible:  allergic reactions (skin rash; itching or hives; swelling of the face, lips, or tongue)  bleeding (bloody or black, tarry stools; red or dark brown urine; spitting up blood or brown material that looks like coffee grounds; red spots on the skin; unusual bruising or bleeding from the eye, gums, or  nose)  blurred vision or changes in vision  confusion  constipation  headache  heart failure (trouble breathing; fast, irregular heartbeat; sudden weight gain; swelling of the ankles, feet, hands)  infection (fever, chills, cough, sore throat, pain or trouble passing urine)  lack or loss of appetite  liver injury (dark yellow or brown urine; general ill feeling or flu-like symptoms; loss of appetite, right upper belly pain; yellowing of the eyes or skin)  low blood pressure (dizziness; feeling faint or lightheaded, falls; unusually weak or tired)  muscle cramps  pain, redness, or irritation at site where injected  pain, tingling, numbness in the hands or feet  seizures  trouble breathing  unusual bruising or bleeding Side effects that usually do not require medical attention (report to your doctor or health care professional if they continue or are bothersome):  diarrhea  nausea  stomach pain  trouble sleeping  vomiting This list may not describe all possible side effects. Call your doctor for medical advice about side effects. You may report side effects to FDA at 1-800-FDA-1088. Where should I keep my medicine? This medicine is given in a hospital or clinic. It will not be stored at home. NOTE: This sheet is a summary. It may not cover all possible information. If you have questions about this medicine, talk to your doctor, pharmacist, or health care provider.  2021 Elsevier/Gold Standard (2020-12-05 13:22:53) Epoetin Alfa injection What is this medicine? EPOETIN ALFA (e POE e tin AL fa) helps your body make more red blood cells. This medicine is used to treat anemia caused by chronic kidney disease, cancer chemotherapy, or HIV-therapy. It may also be used before surgery if you have anemia. This medicine may be used for other purposes; ask your health care provider or pharmacist if you have questions. COMMON BRAND NAME(S): Epogen, Procrit, Retacrit What should I  tell my health care provider before I take this medicine? They need to know if you have any of these conditions:  cancer  heart disease  high blood pressure  history of blood clots  history of stroke  low levels of folate, iron, or vitamin B12 in the blood  seizures  an unusual or allergic reaction to erythropoietin, albumin, benzyl alcohol, hamster proteins, other medicines, foods, dyes, or preservatives  pregnant or trying to get pregnant  breast-feeding How should I use this medicine? This medicine is for injection into a vein or under the skin. It is usually given by a health care professional in a hospital or clinic setting. If you get this medicine at home, you will be taught how to prepare and give this medicine. Use exactly as directed. Take your medicine at regular intervals. Do not take your medicine more often than directed. It is important that you put your used needles and syringes in a special sharps container. Do not put them in a trash can. If you do not have a sharps container, call your pharmacist or healthcare provider to get one. A special MedGuide will be given to you by the pharmacist with each prescription and refill. Be sure to  read this information carefully each time. Talk to your pediatrician regarding the use of this medicine in children. While this drug may be prescribed for selected conditions, precautions do apply. Overdosage: If you think you have taken too much of this medicine contact a poison control center or emergency room at once. NOTE: This medicine is only for you. Do not share this medicine with others. What if I miss a dose? If you miss a dose, take it as soon as you can. If it is almost time for your next dose, take only that dose. Do not take double or extra doses. What may interact with this medicine? Interactions have not been studied. This list may not describe all possible interactions. Give your health care provider a list of all the  medicines, herbs, non-prescription drugs, or dietary supplements you use. Also tell them if you smoke, drink alcohol, or use illegal drugs. Some items may interact with your medicine. What should I watch for while using this medicine? Your condition will be monitored carefully while you are receiving this medicine. You may need blood work done while you are taking this medicine. This medicine may cause a decrease in vitamin B6. You should make sure that you get enough vitamin B6 while you are taking this medicine. Discuss the foods you eat and the vitamins you take with your health care professional. What side effects may I notice from receiving this medicine? Side effects that you should report to your doctor or health care professional as soon as possible:  allergic reactions like skin rash, itching or hives, swelling of the face, lips, or tongue  seizures  signs and symptoms of a blood clot such as breathing problems; changes in vision; chest pain; severe, sudden headache; pain, swelling, warmth in the leg; trouble speaking; sudden numbness or weakness of the face, arm or leg  signs and symptoms of a stroke like changes in vision; confusion; trouble speaking or understanding; severe headaches; sudden numbness or weakness of the face, arm or leg; trouble walking; dizziness; loss of balance or coordination Side effects that usually do not require medical attention (report to your doctor or health care professional if they continue or are bothersome):  chills  cough  dizziness  fever  headaches  joint pain  muscle cramps  muscle pain  nausea, vomiting  pain, redness, or irritation at site where injected This list may not describe all possible side effects. Call your doctor for medical advice about side effects. You may report side effects to FDA at 1-800-FDA-1088. Where should I keep my medicine? Keep out of the reach of children. Store in a refrigerator between 2 and 8 degrees  C (36 and 46 degrees F). Do not freeze or shake. Throw away any unused portion if using a single-dose vial. Multi-dose vials can be kept in the refrigerator for up to 21 days after the initial dose. Throw away unused medicine. NOTE: This sheet is a summary. It may not cover all possible information. If you have questions about this medicine, talk to your doctor, pharmacist, or health care provider.  2021 Elsevier/Gold Standard (2017-07-23 08:35:19)

## 2021-03-07 NOTE — Progress Notes (Signed)
Patient presents today for Velcade and Retacrit injections.  Labs within parameters for treatment.  Hgb noted to be 9.8.    Velcade and Retacrit injections given today per MD orders.  Stable during injection without adverse affects.  Vital signs stable.  No complaints at this time.  Discharge from clinic ambulatory in stable condition.  Alert and oriented X 3.  Follow up with [facility] as scheduled.

## 2021-03-10 LAB — IMMUNOFIXATION ELECTROPHORESIS
IgA: 272 mg/dL (ref 64–422)
IgG (Immunoglobin G), Serum: 741 mg/dL (ref 586–1602)
IgM (Immunoglobulin M), Srm: 95 mg/dL (ref 26–217)
Total Protein ELP: 6.2 g/dL (ref 6.0–8.5)

## 2021-03-10 LAB — KAPPA/LAMBDA LIGHT CHAINS
Kappa free light chain: 38 mg/L — ABNORMAL HIGH (ref 3.3–19.4)
Kappa, lambda light chain ratio: 1.75 — ABNORMAL HIGH (ref 0.26–1.65)
Lambda free light chains: 21.7 mg/L (ref 5.7–26.3)

## 2021-03-10 LAB — PROTEIN ELECTROPHORESIS, SERUM
A/G Ratio: 1.3 (ref 0.7–1.7)
Albumin ELP: 3.4 g/dL (ref 2.9–4.4)
Alpha-1-Globulin: 0.2 g/dL (ref 0.0–0.4)
Alpha-2-Globulin: 0.8 g/dL (ref 0.4–1.0)
Beta Globulin: 0.9 g/dL (ref 0.7–1.3)
Gamma Globulin: 0.7 g/dL (ref 0.4–1.8)
Globulin, Total: 2.6 g/dL (ref 2.2–3.9)
M-Spike, %: 0.3 g/dL — ABNORMAL HIGH
Total Protein ELP: 6 g/dL (ref 6.0–8.5)

## 2021-03-15 ENCOUNTER — Other Ambulatory Visit (HOSPITAL_COMMUNITY)
Admission: RE | Admit: 2021-03-15 | Discharge: 2021-03-15 | Disposition: A | Discharge status: Home / Self Care | Payer: Medicare Other | Source: Ambulatory Visit | Attending: Oral Surgery | Admitting: Oral Surgery

## 2021-03-15 DIAGNOSIS — Z20822 Contact with and (suspected) exposure to covid-19: Secondary | ICD-10-CM | POA: Diagnosis not present

## 2021-03-15 DIAGNOSIS — Z01812 Encounter for preprocedural laboratory examination: Secondary | ICD-10-CM | POA: Insufficient documentation

## 2021-03-15 LAB — SARS CORONAVIRUS 2 (TAT 6-24 HRS): SARS Coronavirus 2: NEGATIVE

## 2021-03-18 ENCOUNTER — Encounter (HOSPITAL_COMMUNITY): Payer: Self-pay | Admitting: Oral Surgery

## 2021-03-18 ENCOUNTER — Other Ambulatory Visit (HOSPITAL_COMMUNITY): Payer: Medicare Other

## 2021-03-18 ENCOUNTER — Ambulatory Visit (HOSPITAL_COMMUNITY): Payer: Medicare Other

## 2021-03-18 ENCOUNTER — Inpatient Hospital Stay (HOSPITAL_BASED_OUTPATIENT_CLINIC_OR_DEPARTMENT_OTHER): Payer: Medicare Other | Admitting: Hematology

## 2021-03-18 ENCOUNTER — Other Ambulatory Visit: Payer: Self-pay

## 2021-03-18 ENCOUNTER — Inpatient Hospital Stay (HOSPITAL_COMMUNITY): Payer: Medicare Other

## 2021-03-18 ENCOUNTER — Ambulatory Visit: Payer: Self-pay | Admitting: Oral Surgery

## 2021-03-18 ENCOUNTER — Ambulatory Visit (HOSPITAL_COMMUNITY): Payer: Medicare Other | Admitting: Hematology

## 2021-03-18 VITALS — BP 135/60 | HR 71 | Temp 97.0°F | Resp 17 | Wt 170.9 lb

## 2021-03-18 DIAGNOSIS — E538 Deficiency of other specified B group vitamins: Secondary | ICD-10-CM

## 2021-03-18 DIAGNOSIS — C9000 Multiple myeloma not having achieved remission: Secondary | ICD-10-CM

## 2021-03-18 DIAGNOSIS — Z5112 Encounter for antineoplastic immunotherapy: Secondary | ICD-10-CM | POA: Diagnosis not present

## 2021-03-18 LAB — COMPREHENSIVE METABOLIC PANEL
ALT: 8 U/L (ref 0–44)
AST: 13 U/L — ABNORMAL LOW (ref 15–41)
Albumin: 3.5 g/dL (ref 3.5–5.0)
Alkaline Phosphatase: 52 U/L (ref 38–126)
Anion gap: 8 (ref 5–15)
BUN: 20 mg/dL (ref 8–23)
CO2: 21 mmol/L — ABNORMAL LOW (ref 22–32)
Calcium: 9.1 mg/dL (ref 8.9–10.3)
Chloride: 111 mmol/L (ref 98–111)
Creatinine, Ser: 1.03 mg/dL — ABNORMAL HIGH (ref 0.44–1.00)
GFR, Estimated: 55 mL/min — ABNORMAL LOW (ref >60)
Glucose, Bld: 145 mg/dL — ABNORMAL HIGH (ref 70–99)
Potassium: 3.9 mmol/L (ref 3.5–5.1)
Sodium: 140 mmol/L (ref 135–145)
Total Bilirubin: 0.7 mg/dL (ref 0.3–1.2)
Total Protein: 6.4 g/dL — ABNORMAL LOW (ref 6.5–8.1)

## 2021-03-18 LAB — CBC WITH DIFFERENTIAL/PLATELET
Abs Immature Granulocytes: 0.01 10*3/uL (ref 0.00–0.07)
Basophils Absolute: 0 10*3/uL (ref 0.0–0.1)
Basophils Relative: 1 %
Eosinophils Absolute: 0.1 10*3/uL (ref 0.0–0.5)
Eosinophils Relative: 3 %
HCT: 31 % — ABNORMAL LOW (ref 36.0–46.0)
Hemoglobin: 9.9 g/dL — ABNORMAL LOW (ref 12.0–15.0)
Immature Granulocytes: 0 %
Lymphocytes Relative: 21 %
Lymphs Abs: 0.8 10*3/uL (ref 0.7–4.0)
MCH: 33.7 pg (ref 26.0–34.0)
MCHC: 31.9 g/dL (ref 30.0–36.0)
MCV: 105.4 fL — ABNORMAL HIGH (ref 80.0–100.0)
Monocytes Absolute: 0.5 10*3/uL (ref 0.1–1.0)
Monocytes Relative: 12 %
Neutro Abs: 2.5 10*3/uL (ref 1.7–7.7)
Neutrophils Relative %: 63 %
Platelets: 177 10*3/uL (ref 150–400)
RBC: 2.94 MIL/uL — ABNORMAL LOW (ref 3.87–5.11)
RDW: 15.9 % — ABNORMAL HIGH (ref 11.5–15.5)
WBC: 3.9 10*3/uL — ABNORMAL LOW (ref 4.0–10.5)
nRBC: 0 % (ref 0.0–0.2)

## 2021-03-18 LAB — LACTATE DEHYDROGENASE: LDH: 136 U/L (ref 98–192)

## 2021-03-18 MED ORDER — PROCHLORPERAZINE MALEATE 10 MG PO TABS
10.0000 mg | ORAL_TABLET | Freq: Once | ORAL | Status: AC
  Administered 2021-03-18: 10 mg via ORAL
  Filled 2021-03-18: qty 1

## 2021-03-18 MED ORDER — EPOETIN ALFA-EPBX 20000 UNIT/ML IJ SOLN
20000.0000 [IU] | Freq: Once | INTRAMUSCULAR | Status: AC
  Administered 2021-03-18: 20000 [IU] via SUBCUTANEOUS
  Filled 2021-03-18: qty 1

## 2021-03-18 MED ORDER — DEXAMETHASONE 4 MG PO TABS
40.0000 mg | ORAL_TABLET | Freq: Once | ORAL | Status: AC
  Administered 2021-03-18: 40 mg via ORAL
  Filled 2021-03-18: qty 10

## 2021-03-18 MED ORDER — SODIUM CHLORIDE 0.9 % IV SOLN
3.0000 g | INTRAVENOUS | Status: DC
  Filled 2021-03-18: qty 8

## 2021-03-18 MED ORDER — BORTEZOMIB CHEMO SQ INJECTION 3.5 MG (2.5MG/ML)
1.3000 mg/m2 | Freq: Once | INTRAMUSCULAR | Status: AC
  Administered 2021-03-18: 2.25 mg via SUBCUTANEOUS
  Filled 2021-03-18: qty 0.9

## 2021-03-18 NOTE — Patient Instructions (Signed)
[facility] Discharge Instructions for Patients Receiving Chemotherapy   Beginning January 23rd 2017 lab work for the [facility] will be done in the  Main lab at [facility] on 1st floor. If you have a lab appointment with the [facility] please come in thru the  Main Entrance and check in at the main information desk   Today you received the following chemotherapy agents Velcade and Retacrit  To help prevent nausea and vomiting after your treatment, we encourage you to take your nausea medication    If you develop nausea and vomiting, or diarrhea that is not controlled by your medication, call the clinic.  The clinic phone number is [phone]. Office hours are Monday-Friday 8:30am-5:00pm.  BELOW ARE SYMPTOMS THAT SHOULD BE REPORTED IMMEDIATELY:  *FEVER GREATER THAN 101.0 F  *CHILLS WITH OR WITHOUT FEVER  NAUSEA AND VOMITING THAT IS NOT CONTROLLED WITH YOUR NAUSEA MEDICATION  *UNUSUAL SHORTNESS OF BREATH  *UNUSUAL BRUISING OR BLEEDING  TENDERNESS IN MOUTH AND THROAT WITH OR WITHOUT PRESENCE OF ULCERS  *URINARY PROBLEMS  *BOWEL PROBLEMS  UNUSUAL RASH Items with * indicate a potential emergency and should be followed up as soon as possible. If you have an emergency after office hours please contact your primary care physician or go to the nearest emergency department.  Please call the clinic during office hours if you have any questions or concerns.   You may also contact the Patient Navigator at [phone] should you have any questions or need assistance in obtaining follow up care.      Resources For Cancer Patients and their Caregivers ? American Cancer Society: Can assist with transportation, wigs, general needs, runs Look Good Feel Better.        902-040-1069 ? Cancer Care: Provides financial assistance, online support groups, medication/co-pay assistance.  1-800-813-HOPE (934)311-5304) ? [facility] Assists  [place] cancer patients and their families through emotional , educational and financial support.  249-610-7514 ? [place] DSS Where to apply for food stamps, Medicaid and utility assistance. 305-068-8768 ? [facility]: Transportation to medical appointments. (531)247-7833 ? Social Security Administration: May apply for disability if have a Stage IV cancer. 2672712772 650-326-7674 ? [employer], Disability and Transit Services: Assists with nutrition, care and transit needs. 4372508023

## 2021-03-18 NOTE — Patient Instructions (Signed)
[facility] Discharge Instructions  You were seen today by Dr. Delton Coombes. He went over your recent results. You received your treatment today; continue getting your treatment every week. You may proceed with your dental surgery. Continue taking Revlimid 2 weeks on with 1 week off. Dr. Delton Coombes will see you back in 2 months for labs and follow up.   Thank you for choosing [facility] at [facility] to provide your oncology and hematology care.  To afford each patient quality time with our provider, please arrive at least 15 minutes before your scheduled appointment time.   If you have a lab appointment with the [facility] please come in thru the Main Entrance and check in at the main information desk  You need to re-schedule your appointment should you arrive 10 or more minutes late.  We strive to give you quality time with our providers, and arriving late affects you and other patients whose appointments are after yours.  Also, if you no show three or more times for appointments you may be dismissed from the clinic at the providers discretion.     Again, thank you for choosing [facility].  Our hope is that these requests will decrease the amount of time that you wait before being seen by our physicians.       _____________________________________________________________  Should you have questions after your visit to [facility], please contact our office at [phone] between the hours of 8:00 a.m. and 4:30 p.m.  Voicemails left after 4:00 p.m. will not be returned until the following business day.  For prescription refill requests, have your pharmacy contact our office and allow 72 hours.    Cancer Center Support Programs:   > Cancer Support Group  2nd Tuesday of the month 1pm-2pm, Journey Room

## 2021-03-18 NOTE — Progress Notes (Signed)
Patient was assessed by Dr. Delton Coombes and labs have been reviewed.  Patient is okay to proceed with treatment today. Continue holding Xgeva injection. Primary RN and pharmacy aware.

## 2021-03-18 NOTE — Progress Notes (Signed)
PCP - [name], MD Cardiologist - denies  Chest x-ray - 03/25/20 EKG - 03/25/20 Stress Test -  ECHO -  Cardiac Cath -    Fasting Blood Sugar:  unkonwn Checks Blood Sugar:  1x/month  Blood Thinner Instructions:  Aspirin Instructions: Follow your surgeon's instructions on when to stop Aspirin.  If no instructions were given by your surgeon then you will need to call the office to get those instructions.    Per pt sister (caretaker) last dose ASA 03/18/21   COVID TEST- 03/15/21 negative   Anesthesia review: n/a  -------------  SDW INSTRUCTIONS:  Your procedure is scheduled on 03/19/32. Please report to [facility] Main Entrance "A" at 0800 A.M., and check in at the Admitting office. Call this number if you have problems the morning of surgery: [phone]   Remember: Do not eat or drink after midnight the night before your surgery   Medications to take morning of surgery with a sip of water include: acyclovir (ZOVIRAX)  dexamethasone (DECADRON)   glipiZIDE (GLUCOTROL) 3/22: none 3/23: none  metFORMIN (GLUCOPHAGE)  3/23: none   ** PLEASE check your blood sugar the morning of your surgery when you wake up and every 2 hours until you get to the Short Stay unit.  If your blood sugar is less than 70 mg/dL, you will need to treat for low blood sugar: - Do not take insulin. - Treat a low blood sugar (less than 70 mg/dL) with  cup of clear juice (cranberry or apple), 4 glucose tablets, OR glucose gel. - Recheck blood sugar in 15 minutes after treatment (to make sure it is greater than 70 mg/dL). If your blood sugar is not greater than 70 mg/dL on recheck, call [phone] for further instructions.   As of today, STOP taking any  Aleve, Naproxen, Ibuprofen, Motrin, Advil, Goody's, BC's, all herbal medications, fish oil, and all vitamins.    The Morning of Surgery Do not wear jewelry, make-up or nail polish. Do not wear lotions, powders, or perfumes, or deodorant Do not  shave 48 hours prior to surgery.  Do not bring valuables to the hospital. [facility] is not responsible for any belongings or valuables. If you are a smoker, DO NOT Smoke 24 hours prior to surgery If you wear a CPAP at night please bring your mask the morning of surgery  Remember that you must have someone to transport you home after your surgery, and remain with you for 24 hours if you are discharged the same day. Please bring cases for contacts, glasses, hearing aids, dentures or bridgework because it cannot be worn into surgery.   Patients discharged the day of surgery will not be allowed to drive home.   Please shower the NIGHT BEFORE SURGERY and the MORNING OF SURGERY with DIAL Soap. Wear comfortable clothes the morning of surgery. Oral Hygiene is also important to reduce your risk of infection.  Remember - BRUSH YOUR TEETH THE MORNING OF SURGERY WITH YOUR REGULAR TOOTHPASTE  Patient denies shortness of breath, fever, cough and chest pain.

## 2021-03-18 NOTE — Progress Notes (Signed)
Brittany Archer presents today for D1C40 Velcade. Pt denies any new changes or symptoms since last treatment. Lab results and vitals have been reviewed and are stable and within parameters for treatment. Patient has been assessed by Dr. Delton Coombes who has approved proceeding with treatment today as planned.  Injections tolerated without incident or complaint, see MAR for details. Discharged in satisfactory condition with follow up instructions.

## 2021-03-18 NOTE — Progress Notes (Signed)
[facility] [place] [address], [place] [address]   CLINIC:  Medical Oncology/Hematology  PCP:  [name], MD [address] / [place] [address] [phone]   REASON FOR VISIT:  Follow-up for multiple myeloma  PRIOR THERAPY: None  NGS Results: Not done  CURRENT THERAPY: Velcade 3/4 weeks; Revlimid 20 mg 2/3 weeks  BRIEF ONCOLOGIC HISTORY:  Oncology History  Multiple myeloma not having achieved remission ([place])  01/20/2018 Initial Diagnosis   Multiple myeloma not having achieved remission ([place])   01/26/2018 -  Chemotherapy    Patient is on Treatment Plan: MYELOMA  RVD SQ (BORTEZOMIB D 1,8,15 ) Q28D X 4 CYCLES        CANCER STAGING: Cancer Staging No matching staging information was found for the patient.  INTERVAL HISTORY:  Brittany Archer, a 80 y.o. female, returns for routine follow-up and consideration for next cycle of chemotherapy. Brittany Archer was last seen on 01/28/2021.  Due for cycle #40 of Velcade today.   Today Brittany Archer is accompanied by her daughter. Overall, Brittany Archer tells me Brittany Archer has been feeling pretty well. Brittany Archer is taking Revlimid 20 mg 2 weeks on with 1 week off and tolerating it well. Brittany Archer has 1 week left of Revlimid. Brittany Archer is taking 1 teaspoon of potassium TID. Brittany Archer denies having numbness, tingling, diarrhea, new pains or any falls. Her appetite is excellent.  Brittany Archer is scheduled to have a debridement of her right mandible with Dr. [name] on 03/23.  Overall, Brittany Archer feels ready for next cycle of chemo today.    REVIEW OF SYSTEMS:  Review of Systems  Constitutional: Positive for fatigue (75%). Negative for appetite change.  Gastrointestinal: Negative for diarrhea.  Musculoskeletal: Negative for arthralgias and myalgias.  Neurological: Negative for numbness.  All other systems reviewed and are negative.   PAST MEDICAL/SURGICAL HISTORY:  Past Medical History:  Diagnosis Date  . Breast cancer ([place])    left breast/ 2008/ surg/ rad tx   . Coronary artery disease   . Diabetes mellitus    Past Surgical History:  Procedure Laterality Date  . ABDOMINAL HYSTERECTOMY    . BREAST SURGERY    . DEBRIDEMENT MANDIBLE N/A 02/22/2020   Procedure: INCISION AND DRAINAGE WITH DEBRIDEMENT MANDIBLE;  Surgeon: Michael Litter, DMD;  Location: [facility];  Service: Oral Surgery;  Laterality: N/A;  . TOOTH EXTRACTION N/A 02/22/2020   Procedure: DENTAL RESTORATION/EXTRACTIONS;  Surgeon: Michael Litter, DMD;  Location: [facility];  Service: Oral Surgery;  Laterality: N/A;  DENTAL KIT REQUESTED    SOCIAL HISTORY:  Social History   Socioeconomic History  . Marital status: Divorced    Spouse name: Not on file  . Number of children: Not on file  . Years of education: Not on file  . Highest education level: Not on file  Occupational History  . Not on file  Tobacco Use  . Smoking status: Never Smoker  . Smokeless tobacco: Never Used  Vaping Use  . Vaping Use: Never used  Substance and Sexual Activity  . Alcohol use: No  . Drug use: No  . Sexual activity: Yes    Birth control/protection: Surgical  Other Topics Concern  . Not on file  Social History Narrative  . Not on file   Social Determinants of Health   Financial Resource Strain: Low Risk   . Difficulty of Paying Living Expenses: Not hard at all  Food Insecurity: No Food Insecurity  . Worried About Charity fundraiser in  the Last Year: Never true  . Ran Out of Food in the Last Year: Never true  Transportation Needs: No Transportation Needs  . Lack of Transportation (Medical): No  . Lack of Transportation (Non-Medical): No  Physical Activity: Inactive  . Days of Exercise per Week: 0 days  . Minutes of Exercise per Session: 0 min  Stress: No Stress Concern Present  . Feeling of Stress : Not at all  Social Connections: Moderately Isolated  . Frequency of Communication with Friends and Family: More than three times a week  . Frequency of Social Gatherings with Friends and Family: More  than three times a week  . Attends Religious Services: More than 4 times per year  . Active Member of Clubs or Organizations: No  . Attends Archivist Meetings: Never  . Marital Status: Divorced  Human resources officer Violence: Not At Risk  . Fear of Current or Ex-Partner: No  . Emotionally Abused: No  . Physically Abused: No  . Sexually Abused: No    FAMILY HISTORY:  Family History  Problem Relation Age of Onset  . Obesity Sister     CURRENT MEDICATIONS:  Current Outpatient Medications  Medication Sig Dispense Refill  . acyclovir (ZOVIRAX) 400 MG tablet TAKE 1 TABLET BY MOUTH TWICE DAILY (Patient taking differently: Take 400 mg by mouth 2 (two) times daily.) 60 tablet 11  . aspirin 81 MG tablet Take 81 mg by mouth daily.    . bortezomib IV (VELCADE) 3.5 MG injection Inject 3.5 mg into the vein once a week. weekly    . chlorhexidine (PERIDEX) 0.12 % solution Use as directed 15 mLs in the mouth or throat 3 (three) times daily.    Marland Kitchen dexamethasone (DECADRON) 4 MG tablet Take 10 tablets (40 mg) on days 1, 8, and 15 of chemo. Repeat every 21 days. (Patient taking differently: Take 4 mg by mouth See admin instructions. Take 10 tablets (40 mg) on days 1, 8, and 15 of chemo. Repeat every 21 days.) 30 tablet 3  . glipiZIDE (GLUCOTROL) 5 MG tablet Take 5 mg by mouth daily before breakfast.     . glucose blood (ACCU-CHEK AVIVA PLUS) test strip CHECK BLOOD SUGAR ONCE DAILY    . lenalidomide (REVLIMID) 20 MG capsule Take 20 mg capsule by mouth once daily for 14 days on, and 7 days off of a 21 day cycle. (Patient taking differently: Take 20 mg by mouth See admin instructions. Take 20 mg capsule by mouth once daily for 14 days on, and 7 days off of a 21 day cycle.) 14 capsule 0  . lisinopril-hydrochlorothiazide (PRINZIDE,ZESTORETIC) 20-25 MG tablet Take 1 tablet by mouth daily.    . magnesium oxide (MAG-OX) 400 (241.3 Mg) MG tablet Take 1 tablet (400 mg total) by mouth in the morning, at noon,  and at bedtime. 90 tablet 6  . metFORMIN (GLUCOPHAGE) 1000 MG tablet Take 1,000 mg by mouth 2 (two) times daily with a meal.    . potassium chloride 20 MEQ/15ML (10%) SOLN Take 6 mEq by mouth 3 (three) times daily. 5 ml    . potassium chloride SA (KLOR-CON) 20 MEQ tablet TAKE (2) TABLETS BY MOUTH THREE TIMES DAILY. 168 tablet 11   No current facility-administered medications for this visit.    ALLERGIES:  Allergies  Allergen Reactions  . Seasonal Ic [Cholestatin] Other (See Comments)    Sneezing, watery eyes  . Motrin [Ibuprofen] Rash    PHYSICAL EXAM:  Performance status (ECOG): 1 -  Symptomatic but completely ambulatory  Vitals:   03/18/21 0955  BP: 135/60  Pulse: 71  Resp: 17  Temp: (!) 97 F (36.1 C)  SpO2: 100%   Wt Readings from Last 3 Encounters:  03/18/21 170 lb 14.4 oz (77.5 kg)  02/25/21 166 lb 12.8 oz (75.7 kg)  02/18/21 168 lb 6.4 oz (76.4 kg)   Physical Exam Vitals reviewed.  Constitutional:      Appearance: Normal appearance.  HENT:     Mouth/Throat:     Lips: No lesions.     Mouth: No oral lesions.     Tongue: No lesions.     Comments: Exposed bone on right lower mandible Cardiovascular:     Rate and Rhythm: Normal rate and regular rhythm.     Pulses: Normal pulses.     Heart sounds: Normal heart sounds.  Pulmonary:     Effort: Pulmonary effort is normal.     Breath sounds: Normal breath sounds.  Musculoskeletal:     Right lower leg: Edema (trace) present.     Left lower leg: Edema (trace) present.  Neurological:     General: No focal deficit present.     Mental Status: Brittany Archer is alert and oriented to person, place, and time.  Psychiatric:        Mood and Affect: Mood normal.        Behavior: Behavior normal.     LABORATORY DATA:  I have reviewed the labs as listed.  CBC Latest Ref Rng & Units 03/18/2021 03/07/2021 02/25/2021  WBC 4.0 - 10.5 K/uL 3.9(L) 4.5 4.2  Hemoglobin 12.0 - 15.0 g/dL 9.9(L) 9.8(L) 9.9(L)  Hematocrit 36.0 - 46.0 % 31.0(L)  30.2(L) 31.1(L)  Platelets 150 - 400 K/uL 177 156 150   CMP Latest Ref Rng & Units 03/18/2021 03/07/2021 02/25/2021  Glucose 70 - 99 mg/dL 145(H) 77 158(H)  BUN 8 - 23 mg/dL 20 27(H) 27(H)  Creatinine 0.44 - 1.00 mg/dL 1.03(H) 1.18(H) 1.03(H)  Sodium 135 - 145 mmol/L 140 140 141  Potassium 3.5 - 5.1 mmol/L 3.9 3.4(L) 3.6  Chloride 98 - 111 mmol/L 111 109 105  CO2 22 - 32 mmol/L 21(L) 23 26  Calcium 8.9 - 10.3 mg/dL 9.1 9.3 9.8  Total Protein 6.5 - 8.1 g/dL 6.4(L) 6.3(L) 6.4(L)  Total Bilirubin 0.3 - 1.2 mg/dL 0.7 0.7 0.4  Alkaline Phos 38 - 126 U/L 52 56 65  AST 15 - 41 U/L 13(L) 12(L) 13(L)  ALT 0 - 44 U/L '8 10 10   ' Lab Results  Component Value Date   LDH 136 03/18/2021   LDH 151 02/18/2021   LDH 133 02/04/2021   Lab Results  Component Value Date   TOTALPROTELP 6.2 03/07/2021   TOTALPROTELP 6.0 03/07/2021   ALBUMINELP 3.4 03/07/2021   A1GS 0.2 03/07/2021   A2GS 0.8 03/07/2021   BETS 0.9 03/07/2021   GAMS 0.7 03/07/2021   MSPIKE 0.3 (H) 03/07/2021   SPEI Comment 03/07/2021    Lab Results  Component Value Date   KPAFRELGTCHN 38.0 (H) 03/07/2021   LAMBDASER 21.7 03/07/2021   KAPLAMBRATIO 1.75 (H) 03/07/2021    DIAGNOSTIC IMAGING:  I have independently reviewed the scans and discussed with the patient. No results found.   ASSESSMENT:  1. IgA kappa plasma cell myeloma, stage I: -RVD started on 01/09/2018, held since 02/14/2020 due to mandible abscess. -Myeloma labs on 05/14/2020 showed progression with M spike of 0.5 g. -RVD started back on 05/21/2020. -Myeloma labs on 07/10/2020 shows M  spike improved to 0.3 g from 0.6 g previously. Free light chain ratio is 1.74 with kappa light chains 28.4. -Myeloma panel from 08/14/2020 shows M spike 0.4 g. Kappa light chains are 24.8 and ratio is 2.23.  2. Osteomyelitis of the right mandible/dental abscess: -Finished IV ceftriaxone on 04/03/2020. Finished oral antibiotics. -We will hold Xgeva indefinitely.   PLAN:  1. IgA  kappa plasma cell myeloma, stage I: -Brittany Archer is taking Revlimid 20 mg 2 weeks on/1 week off.  Brittany Archer is taking Velcade 3 weeks on/1 week off.  Brittany Archer is also taking dexamethasone 40 mg weekly. -Reviewed myeloma labs from 03/07/2021.  M spike improved to 0.3 g.  Immunofixation was negative.  Kappa light chains are 38 with ratio of 1.75. -Brittany Archer is continuing to get response.  Brittany Archer does not have any side effects from Velcade or Revlimid.  Hence we will continue with the same treatment plan.  RTC 2 months for follow-up.  2. Severe hypokalemia: -Brittany Archer is taking potassium liquid 3 times a day which has increased her compliance.  Potassium today is 3.9.  3. Osteomyelitis of the right mandible/dental abscess: -Brittany Archer has bone exposure in the right lower jaw.  Bisphosphonates on hold indefinitely.  4. Hypomagnesemia: -Continue magnesium supplements 3 times a day.  5. 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  Hemoglobin today is 9.9 with MCV 105. -Continue Retacrit as needed if hemoglobin drops below 10.   Orders placed this encounter:  Orders Placed This Encounter  Procedures  . CBC with Differential/Platelet  . Comprehensive metabolic panel  . Lactate dehydrogenase  . Immunofixation electrophoresis  . Kappa/lambda light chains  . Protein electrophoresis, serum     Derek Jack, MD [place] [phone]   I, Milinda Antis, am acting as a scribe for Dr. Sanda Linger.  I, Derek Jack MD, have reviewed the above documentation for accuracy and completeness, and I agree with the above.

## 2021-03-19 ENCOUNTER — Ambulatory Visit (HOSPITAL_COMMUNITY)
Admission: RE | Admit: 2021-03-19 | Discharge: 2021-03-19 | Disposition: A | Discharge status: Home / Self Care | Payer: Medicare Other | Attending: Oral Surgery | Admitting: Oral Surgery

## 2021-03-19 ENCOUNTER — Encounter (HOSPITAL_COMMUNITY)
Admission: RE | Disposition: A | Discharge status: Home / Self Care | Payer: Self-pay | Source: Home / Self Care | Attending: Oral Surgery

## 2021-03-19 ENCOUNTER — Encounter (HOSPITAL_COMMUNITY): Payer: Self-pay | Admitting: Oral Surgery

## 2021-03-19 ENCOUNTER — Ambulatory Visit (HOSPITAL_COMMUNITY): Payer: Medicare Other | Admitting: Certified Registered Nurse Anesthetist

## 2021-03-19 ENCOUNTER — Other Ambulatory Visit: Payer: Self-pay

## 2021-03-19 DIAGNOSIS — Z886 Allergy status to analgesic agent status: Secondary | ICD-10-CM | POA: Diagnosis not present

## 2021-03-19 DIAGNOSIS — N182 Chronic kidney disease, stage 2 (mild): Secondary | ICD-10-CM | POA: Diagnosis not present

## 2021-03-19 DIAGNOSIS — Z7984 Long term (current) use of oral hypoglycemic drugs: Secondary | ICD-10-CM | POA: Diagnosis not present

## 2021-03-19 DIAGNOSIS — Z7982 Long term (current) use of aspirin: Secondary | ICD-10-CM | POA: Insufficient documentation

## 2021-03-19 DIAGNOSIS — T50905A Adverse effect of unspecified drugs, medicaments and biological substances, initial encounter: Secondary | ICD-10-CM | POA: Diagnosis not present

## 2021-03-19 DIAGNOSIS — E1122 Type 2 diabetes mellitus with diabetic chronic kidney disease: Secondary | ICD-10-CM | POA: Insufficient documentation

## 2021-03-19 DIAGNOSIS — I129 Hypertensive chronic kidney disease with stage 1 through stage 4 chronic kidney disease, or unspecified chronic kidney disease: Secondary | ICD-10-CM | POA: Insufficient documentation

## 2021-03-19 DIAGNOSIS — M87180 Osteonecrosis due to drugs, jaw: Secondary | ICD-10-CM | POA: Insufficient documentation

## 2021-03-19 DIAGNOSIS — Z79899 Other long term (current) drug therapy: Secondary | ICD-10-CM | POA: Diagnosis not present

## 2021-03-19 DIAGNOSIS — C9000 Multiple myeloma not having achieved remission: Secondary | ICD-10-CM | POA: Diagnosis not present

## 2021-03-19 HISTORY — PX: DEBRIDEMENT MANDIBLE: SHX5308

## 2021-03-19 LAB — GLUCOSE, CAPILLARY
Glucose-Capillary: 157 mg/dL — ABNORMAL HIGH (ref 70–99)
Glucose-Capillary: 181 mg/dL — ABNORMAL HIGH (ref 70–99)

## 2021-03-19 SURGERY — DEBRIDEMENT, MANDIBLE
Anesthesia: General | Site: Mouth | Laterality: Right

## 2021-03-19 MED ORDER — HYDROCODONE-ACETAMINOPHEN 5-325 MG PO TABS: 1.0000 | ORAL_TABLET | ORAL | 0 refills | Status: DC | PRN

## 2021-03-19 MED ORDER — LIDOCAINE-EPINEPHRINE 1 %-1:100000 IJ SOLN
INTRAMUSCULAR | Status: AC
  Filled 2021-03-19: qty 1

## 2021-03-19 MED ORDER — ACETAMINOPHEN 500 MG PO TABS
1000.0000 mg | ORAL_TABLET | Freq: Once | ORAL | Status: AC
  Administered 2021-03-19: 1000 mg via ORAL
  Filled 2021-03-19: qty 2

## 2021-03-19 MED ORDER — AMOXICILLIN-POT CLAVULANATE 875-125 MG PO TABS: 1.0000 | ORAL_TABLET | Freq: Two times a day (BID) | ORAL | 0 refills | Status: AC

## 2021-03-19 MED ORDER — PHENYLEPHRINE HCL-NACL 10-0.9 MG/250ML-% IV SOLN
INTRAVENOUS | Status: DC | PRN
  Administered 2021-03-19: 20 ug/min via INTRAVENOUS

## 2021-03-19 MED ORDER — PHENYLEPHRINE 40 MCG/ML (10ML) SYRINGE FOR IV PUSH (FOR BLOOD PRESSURE SUPPORT)
PREFILLED_SYRINGE | INTRAVENOUS | Status: AC
  Filled 2021-03-19: qty 10

## 2021-03-19 MED ORDER — BUPIVACAINE-EPINEPHRINE 0.5% -1:200000 IJ SOLN
INTRAMUSCULAR | Status: AC
  Filled 2021-03-19: qty 1

## 2021-03-19 MED ORDER — MIDAZOLAM HCL 2 MG/2ML IJ SOLN
INTRAMUSCULAR | Status: AC
  Filled 2021-03-19: qty 2

## 2021-03-19 MED ORDER — SODIUM CHLORIDE 0.9 % IV SOLN
INTRAVENOUS | Status: DC | PRN
  Administered 2021-03-19: 3 g via INTRAVENOUS

## 2021-03-19 MED ORDER — 0.9 % SODIUM CHLORIDE (POUR BTL) OPTIME
TOPICAL | Status: DC | PRN
  Administered 2021-03-19: 1000 mL

## 2021-03-19 MED ORDER — LIDOCAINE-EPINEPHRINE 1 %-1:100000 IJ SOLN
INTRAMUSCULAR | Status: DC | PRN
  Administered 2021-03-19: 9 mL

## 2021-03-19 MED ORDER — CHLORHEXIDINE GLUCONATE 0.12 % MT SOLN: 15.0000 mL | Freq: Three times a day (TID) | OROMUCOSAL | 5 refills | Status: DC

## 2021-03-19 MED ORDER — OXYMETAZOLINE HCL 0.05 % NA SOLN
NASAL | Status: AC
  Filled 2021-03-19: qty 30

## 2021-03-19 MED ORDER — PROPOFOL 10 MG/ML IV BOLUS
INTRAVENOUS | Status: DC | PRN
  Administered 2021-03-19: 160 mg via INTRAVENOUS

## 2021-03-19 MED ORDER — LIDOCAINE 2% (20 MG/ML) 5 ML SYRINGE
INTRAMUSCULAR | Status: AC
  Filled 2021-03-19: qty 5

## 2021-03-19 MED ORDER — ROCURONIUM BROMIDE 10 MG/ML (PF) SYRINGE
PREFILLED_SYRINGE | INTRAVENOUS | Status: DC | PRN
  Administered 2021-03-19: 50 mg via INTRAVENOUS
  Administered 2021-03-19: 10 mg via INTRAVENOUS

## 2021-03-19 MED ORDER — ROCURONIUM BROMIDE 10 MG/ML (PF) SYRINGE
PREFILLED_SYRINGE | INTRAVENOUS | Status: AC
  Filled 2021-03-19: qty 10

## 2021-03-19 MED ORDER — FENTANYL CITRATE (PF) 250 MCG/5ML IJ SOLN
INTRAMUSCULAR | Status: DC | PRN
  Administered 2021-03-19: 100 ug via INTRAVENOUS
  Administered 2021-03-19: 50 ug via INTRAVENOUS

## 2021-03-19 MED ORDER — LIDOCAINE 2% (20 MG/ML) 5 ML SYRINGE
INTRAMUSCULAR | Status: DC | PRN
  Administered 2021-03-19: 100 mg via INTRAVENOUS

## 2021-03-19 MED ORDER — BUPIVACAINE-EPINEPHRINE (PF) 0.5% -1:200000 IJ SOLN
INTRAMUSCULAR | Status: DC | PRN
  Administered 2021-03-19: 6 mL

## 2021-03-19 MED ORDER — FENTANYL CITRATE (PF) 250 MCG/5ML IJ SOLN
INTRAMUSCULAR | Status: AC
  Filled 2021-03-19: qty 5

## 2021-03-19 MED ORDER — DIPHENHYDRAMINE HCL 50 MG/ML IJ SOLN
INTRAMUSCULAR | Status: DC | PRN
  Administered 2021-03-19: 12.5 mg via INTRAVENOUS

## 2021-03-19 MED ORDER — SUCCINYLCHOLINE CHLORIDE 200 MG/10ML IV SOSY
PREFILLED_SYRINGE | INTRAVENOUS | Status: DC | PRN
  Administered 2021-03-19: 140 mg via INTRAVENOUS

## 2021-03-19 MED ORDER — ONDANSETRON HCL 4 MG/2ML IJ SOLN
INTRAMUSCULAR | Status: AC
  Filled 2021-03-19: qty 2

## 2021-03-19 MED ORDER — ONDANSETRON HCL 4 MG/2ML IJ SOLN
INTRAMUSCULAR | Status: DC | PRN
  Administered 2021-03-19: 4 mg via INTRAVENOUS

## 2021-03-19 MED ORDER — SUCCINYLCHOLINE CHLORIDE 200 MG/10ML IV SOSY
PREFILLED_SYRINGE | INTRAVENOUS | Status: AC
  Filled 2021-03-19: qty 10

## 2021-03-19 MED ORDER — SUGAMMADEX SODIUM 200 MG/2ML IV SOLN
INTRAVENOUS | Status: DC | PRN
  Administered 2021-03-19: 200 mg via INTRAVENOUS

## 2021-03-19 MED ORDER — OXYMETAZOLINE HCL 0.05 % NA SOLN
NASAL | Status: DC | PRN
  Administered 2021-03-19: 2 via NASAL

## 2021-03-19 MED ORDER — ORAL CARE MOUTH RINSE: 15.0000 mL | Freq: Once | OROMUCOSAL | Status: AC

## 2021-03-19 MED ORDER — DEXAMETHASONE SODIUM PHOSPHATE 10 MG/ML IJ SOLN
INTRAMUSCULAR | Status: DC | PRN
  Administered 2021-03-19: 5 mg via INTRAVENOUS

## 2021-03-19 MED ORDER — CHLORHEXIDINE GLUCONATE 0.12 % MT SOLN
15.0000 mL | Freq: Once | OROMUCOSAL | Status: AC
  Administered 2021-03-19: 15 mL via OROMUCOSAL
  Filled 2021-03-19: qty 15

## 2021-03-19 MED ORDER — PHENYLEPHRINE HCL (PRESSORS) 10 MG/ML IV SOLN
INTRAVENOUS | Status: DC | PRN
  Administered 2021-03-19: 40 ug via INTRAVENOUS

## 2021-03-19 MED ORDER — OXYMETAZOLINE HCL 0.05 % NA SOLN
NASAL | Status: DC | PRN
  Administered 2021-03-19: 2

## 2021-03-19 MED ORDER — LACTATED RINGERS IV SOLN: INTRAVENOUS | Status: DC

## 2021-03-19 SURGICAL SUPPLY — 53 items
BLADE SURG 15 STRL LF DISP TIS (BLADE) IMPLANT
BLADE SURG 15 STRL SS (BLADE)
BUR CROSS CUT FISSURE 1.6 (BURR) ×1 IMPLANT
BUR EGG ELITE 4.0 (BURR) ×1 IMPLANT
BUR SURG 4X8 MED (BURR) IMPLANT
BURR SURG 4X8 MED (BURR)
CANISTER SUCT 3000ML PPV (MISCELLANEOUS) ×2 IMPLANT
CLEANER TIP ELECTROSURG 2X2 (MISCELLANEOUS) ×2 IMPLANT
COVER SURGICAL LIGHT HANDLE (MISCELLANEOUS) ×2 IMPLANT
COVER WAND RF STERILE (DRAPES) ×1 IMPLANT
DRAPE HALF SHEET 40X57 (DRAPES) IMPLANT
ELECT COATED BLADE 2.86 ST (ELECTRODE) ×1 IMPLANT
ELECT NDL TIP 2.8 STRL (NEEDLE) IMPLANT
ELECT NEEDLE TIP 2.8 STRL (NEEDLE) IMPLANT
ELECT REM PT RETURN 9FT ADLT (ELECTROSURGICAL)
ELECTRODE REM PT RTRN 9FT ADLT (ELECTROSURGICAL) ×1 IMPLANT
GLOVE ORTHO TXT STRL SZ7.5 (GLOVE) ×2 IMPLANT
GOWN STRL REUS W/ TWL LRG LVL3 (GOWN DISPOSABLE) ×2 IMPLANT
GOWN STRL REUS W/TWL LRG LVL3 (GOWN DISPOSABLE) ×4
KIT BASIN OR (CUSTOM PROCEDURE TRAY) ×2 IMPLANT
KIT TURNOVER KIT B (KITS) ×2 IMPLANT
MATRIX WOUND 3-LAYER 7X10 (Tissue) ×1 IMPLANT
NDL BLUNT 18X1 FOR OR ONLY (NEEDLE) ×1 IMPLANT
NDL HYPO 25GX1X1/2 BEV (NEEDLE) IMPLANT
NEEDLE BLUNT 18X1 FOR OR ONLY (NEEDLE) IMPLANT
NEEDLE HYPO 25GX1X1/2 BEV (NEEDLE) ×4 IMPLANT
NS IRRIG 1000ML POUR BTL (IV SOLUTION) ×2 IMPLANT
PAD ARMBOARD 7.5X6 YLW CONV (MISCELLANEOUS) ×3 IMPLANT
PATTIES SURGICAL .5 X3 (DISPOSABLE) IMPLANT
PENCIL BUTTON HOLSTER BLD 10FT (ELECTRODE) ×1 IMPLANT
SCISSORS WIRE DISP (INSTRUMENTS) ×1 IMPLANT
STAPLER VISISTAT 35W (STAPLE) ×1 IMPLANT
SUCTION FRAZIER HANDLE 10FR (MISCELLANEOUS)
SUCTION FRAZIER TIP 8 FR DISP (SUCTIONS)
SUCTION TUBE FRAZIER 10FR DISP (MISCELLANEOUS) IMPLANT
SUCTION TUBE FRAZIER 8FR DISP (SUCTIONS) IMPLANT
SUT BONE WAX W31G (SUTURE) IMPLANT
SUT CHROMIC 3 0 SH 27 (SUTURE) IMPLANT
SUT ETHILON 3 0 PS 1 (SUTURE) IMPLANT
SUT SILK 3 0 (SUTURE)
SUT SILK 3 0 SH 30 (SUTURE) IMPLANT
SUT SILK 3-0 18XBRD TIE 12 (SUTURE) IMPLANT
SUT STEEL 0 (SUTURE)
SUT STEEL 0 18XMFL TIE 17 (SUTURE) IMPLANT
SUT STEEL 2 (SUTURE) IMPLANT
SUT VIC AB 3-0 FS2 27 (SUTURE) ×1 IMPLANT
SUT VIC AB 4-0 P-3 18X BRD (SUTURE) IMPLANT
SUT VIC AB 4-0 P3 18 (SUTURE) ×2
SUT VIC AB 5-0 P-3 18XBRD (SUTURE) IMPLANT
SUT VIC AB 5-0 P3 18 (SUTURE)
SYR 50ML LL SCALE MARK (SYRINGE) ×2 IMPLANT
TRAY ENT MC OR (CUSTOM PROCEDURE TRAY) ×1 IMPLANT
WATER STERILE IRR 1000ML POUR (IV SOLUTION) ×1 IMPLANT

## 2021-03-19 NOTE — Anesthesia Preprocedure Evaluation (Addendum)
Anesthesia Evaluation  Patient identified by MRN, date of birth, ID band Patient confused    Reviewed: Allergy & Precautions, NPO status , Patient's Chart, lab work & pertinent test results  History of Anesthesia Complications Negative for: history of anesthetic complications  Airway Mallampati: I  TM Distance: >3 FB Neck ROM: Full    Dental  (+) Edentulous Lower, Dental Advisory Given   Pulmonary neg pulmonary ROS,    Pulmonary exam normal        Cardiovascular hypertension, Pt. on medications Normal cardiovascular exam     Neuro/Psych negative neurological ROS     GI/Hepatic negative GI ROS, Neg liver ROS,   Endo/Other  diabetes  Renal/GU Renal InsufficiencyRenal disease     Musculoskeletal negative musculoskeletal ROS (+)   Abdominal   Peds  Hematology  (+) Blood dyscrasia, anemia , Hx of Multiple Myeloma   Anesthesia Other Findings   Reproductive/Obstetrics                            Anesthesia Physical Anesthesia Plan  ASA: III  Anesthesia Plan: General   Post-op Pain Management:    Induction:   PONV Risk Score and Plan: 4 or greater and Ondansetron, Dexamethasone and Diphenhydramine  Airway Management Planned: Oral ETT  Additional Equipment:   Intra-op Plan:   Post-operative Plan: Extubation in OR  Informed Consent: I have reviewed the patients History and Physical, chart, labs and discussed the procedure including the risks, benefits and alternatives for the proposed anesthesia with the patient or authorized representative who has indicated his/her understanding and acceptance.       Plan Discussed with: Anesthesiologist and CRNA  Anesthesia Plan Comments: (Discussed procedure and pre-op with pt's sister.)       Anesthesia Quick Evaluation

## 2021-03-19 NOTE — Op Note (Incomplete)
03/19/2021  12:13 PM  PATIENT:  Brittany Archer  80 y.o. female  PRE-OPERATIVE DIAGNOSIS:  MEDICATION RELATED OSTEONECROSIS OF THE JAWS  POST-OPERATIVE DIAGNOSIS:  MEDICATION RELATED OSTEONECROSIS OF THE JAWS  PROCEDURE:  Procedure(s): DEBRIDEMENT OF BONE RIGHT INTERIOR  MANDIBLE (Right)  SURGEON:  Surgeon(s) and Role:    * Drab, Justin, DMD - Primary  ANESTHESIA:   general  EBL:  20 mL

## 2021-03-19 NOTE — Anesthesia Postprocedure Evaluation (Signed)
Anesthesia Post Note  Patient: Brittany Archer  Procedure(s) Performed: DEBRIDEMENT OF BONE RIGHT INTERIOR  MANDIBLE (Right Mouth)     Patient location during evaluation: PACU Anesthesia Type: General Level of consciousness: sedated Pain management: pain level controlled Vital Signs Assessment: post-procedure vital signs reviewed and stable Respiratory status: spontaneous breathing and respiratory function stable Cardiovascular status: stable Postop Assessment: no apparent nausea or vomiting Anesthetic complications: no   No complications documented.  Last Vitals:  Vitals:   03/19/21 1235 03/19/21 1250  BP: (!) 144/58 (!) 152/81  Pulse: 77 62  Resp: 18 14  Temp:  (!) 36.2 C  SpO2: 99% 98%    Last Pain:  Vitals:   03/19/21 1250  TempSrc:   PainSc: 0-No pain                 [name]

## 2021-03-19 NOTE — Discharge Instructions (Signed)
Soft diet until further notice. Keep head elevated for first 5 days. Gauze pressure if needed for hemostasis.

## 2021-03-19 NOTE — Brief Op Note (Signed)
03/19/2021  12:13 PM  PATIENT:  Brittany Archer  80 y.o. female  PRE-OPERATIVE DIAGNOSIS:  MEDICATION RELATED OSTEONECROSIS OF THE JAWS  POST-OPERATIVE DIAGNOSIS:  MEDICATION RELATED OSTEONECROSIS OF THE JAWS  PROCEDURE:  Procedure(s): DEBRIDEMENT OF BONE RIGHT INTERIOR  MANDIBLE (Right)  SURGEON:  Surgeon(s) and Role:    * Hanin Decook, DMD - Primary  ANESTHESIA:   general  EBL:  20 mL   BLOOD ADMINISTERED:none  DRAINS: none   LOCAL MEDICATIONS USED:  LIDOCAINE   SPECIMEN:  Source of Specimen:  bone  DISPOSITION OF SPECIMEN:  PATHOLOGY  COUNTS:  YES  TOURNIQUET:  * No tourniquets in log *  DICTATION: .Dragon Dictation  PLAN OF CARE: Discharge to home after PACU  PATIENT DISPOSITION:  PACU - hemodynamically stable.   Delay start of Pharmacological VTE agent (>24hrs) due to surgical blood loss or risk of bleeding: not applicable

## 2021-03-19 NOTE — Transfer of Care (Signed)
Immediate Anesthesia Transfer of Care Note  Patient: Brittany Archer  Procedure(s) Performed: DEBRIDEMENT OF BONE RIGHT INTERIOR  MANDIBLE (Right Mouth)  Patient Location: PACU  Anesthesia Type:General  Level of Consciousness: awake and alert   Airway & Oxygen Therapy: Patient Spontanous Breathing and Patient connected to face mask oxygen  Post-op Assessment: Report given to RN and Post -op Vital signs reviewed and stable  Post vital signs: Reviewed and stable  Last Vitals:  Vitals Value Taken Time  BP 150/57 03/19/21 1220  Temp    Pulse 79 03/19/21 1223  Resp 16 03/19/21 1223  SpO2 100 % 03/19/21 1223  Vitals shown include unvalidated device data.  Last Pain:  Vitals:   03/19/21 1220  TempSrc:   PainSc: 0-No pain         Complications: No complications documented.

## 2021-03-19 NOTE — Anesthesia Procedure Notes (Signed)
Procedure Name: Intubation Date/Time: 03/19/2021 10:46 AM Performed by: Reece Agar, CRNA Pre-anesthesia Checklist: Patient identified, Emergency Drugs available, Suction available and Patient being monitored Patient Re-evaluated:Patient Re-evaluated prior to induction Oxygen Delivery Method: Circle System Utilized Preoxygenation: Pre-oxygenation with 100% oxygen Induction Type: IV induction Ventilation: Mask ventilation without difficulty Laryngoscope Size: Mac and 3 Grade View: Grade I Nasal Tubes: Nasal Rae, Nasal prep performed and Right Tube size: 7.0 mm Number of attempts: 1 Placement Confirmation: ETT inserted through vocal cords under direct vision,  positive ETCO2 and breath sounds checked- equal and bilateral Secured at: 26 cm Tube secured with: Tape Dental Injury: Teeth and Oropharynx as per pre-operative assessment

## 2021-03-20 ENCOUNTER — Encounter (HOSPITAL_COMMUNITY): Payer: Self-pay | Admitting: Oral Surgery

## 2021-03-21 LAB — SURGICAL PATHOLOGY

## 2021-03-25 ENCOUNTER — Ambulatory Visit (HOSPITAL_COMMUNITY): Payer: Medicare Other

## 2021-03-25 ENCOUNTER — Encounter (HOSPITAL_COMMUNITY): Payer: Self-pay

## 2021-03-25 ENCOUNTER — Inpatient Hospital Stay (HOSPITAL_COMMUNITY): Payer: Medicare Other

## 2021-03-25 ENCOUNTER — Other Ambulatory Visit: Payer: Self-pay

## 2021-03-25 VITALS — BP 139/68 | HR 69 | Temp 97.0°F | Resp 18 | Wt 167.0 lb

## 2021-03-25 DIAGNOSIS — C9000 Multiple myeloma not having achieved remission: Secondary | ICD-10-CM

## 2021-03-25 DIAGNOSIS — Z5112 Encounter for antineoplastic immunotherapy: Secondary | ICD-10-CM | POA: Diagnosis not present

## 2021-03-25 DIAGNOSIS — E538 Deficiency of other specified B group vitamins: Secondary | ICD-10-CM

## 2021-03-25 LAB — COMPREHENSIVE METABOLIC PANEL
ALT: 5 U/L (ref 0–44)
AST: 10 U/L — ABNORMAL LOW (ref 15–41)
Albumin: 3.4 g/dL — ABNORMAL LOW (ref 3.5–5.0)
Alkaline Phosphatase: 51 U/L (ref 38–126)
Anion gap: 8 (ref 5–15)
BUN: 13 mg/dL (ref 8–23)
CO2: 27 mmol/L (ref 22–32)
Calcium: 9.3 mg/dL (ref 8.9–10.3)
Chloride: 103 mmol/L (ref 98–111)
Creatinine, Ser: 1.07 mg/dL — ABNORMAL HIGH (ref 0.44–1.00)
GFR, Estimated: 53 mL/min — ABNORMAL LOW (ref >60)
Glucose, Bld: 163 mg/dL — ABNORMAL HIGH (ref 70–99)
Potassium: 3.4 mmol/L — ABNORMAL LOW (ref 3.5–5.1)
Sodium: 138 mmol/L (ref 135–145)
Total Bilirubin: 0.6 mg/dL (ref 0.3–1.2)
Total Protein: 6.4 g/dL — ABNORMAL LOW (ref 6.5–8.1)

## 2021-03-25 LAB — CBC WITH DIFFERENTIAL/PLATELET
Abs Immature Granulocytes: 0.01 10*3/uL (ref 0.00–0.07)
Basophils Absolute: 0 10*3/uL (ref 0.0–0.1)
Basophils Relative: 0 %
Eosinophils Absolute: 0.1 10*3/uL (ref 0.0–0.5)
Eosinophils Relative: 3 %
HCT: 33.2 % — ABNORMAL LOW (ref 36.0–46.0)
Hemoglobin: 10.4 g/dL — ABNORMAL LOW (ref 12.0–15.0)
Immature Granulocytes: 0 %
Lymphocytes Relative: 27 %
Lymphs Abs: 0.9 10*3/uL (ref 0.7–4.0)
MCH: 33.4 pg (ref 26.0–34.0)
MCHC: 31.3 g/dL (ref 30.0–36.0)
MCV: 106.8 fL — ABNORMAL HIGH (ref 80.0–100.0)
Monocytes Absolute: 0.4 10*3/uL (ref 0.1–1.0)
Monocytes Relative: 10 %
Neutro Abs: 2.1 10*3/uL (ref 1.7–7.7)
Neutrophils Relative %: 60 %
Platelets: 178 10*3/uL (ref 150–400)
RBC: 3.11 MIL/uL — ABNORMAL LOW (ref 3.87–5.11)
RDW: 15.9 % — ABNORMAL HIGH (ref 11.5–15.5)
WBC: 3.6 10*3/uL — ABNORMAL LOW (ref 4.0–10.5)
nRBC: 0 % (ref 0.0–0.2)

## 2021-03-25 LAB — LACTATE DEHYDROGENASE: LDH: 124 U/L (ref 98–192)

## 2021-03-25 MED ORDER — PROCHLORPERAZINE MALEATE 10 MG PO TABS
10.0000 mg | ORAL_TABLET | Freq: Once | ORAL | Status: AC
  Administered 2021-03-25: 10 mg via ORAL

## 2021-03-25 MED ORDER — CYANOCOBALAMIN 1000 MCG/ML IJ SOLN
INTRAMUSCULAR | Status: AC
  Filled 2021-03-25: qty 1

## 2021-03-25 MED ORDER — CYANOCOBALAMIN 1000 MCG/ML IJ SOLN
1000.0000 ug | Freq: Once | INTRAMUSCULAR | Status: AC
  Administered 2021-03-25: 1000 ug via INTRAMUSCULAR

## 2021-03-25 MED ORDER — BORTEZOMIB CHEMO SQ INJECTION 3.5 MG (2.5MG/ML)
1.3000 mg/m2 | Freq: Once | INTRAMUSCULAR | Status: AC
  Administered 2021-03-25: 2.25 mg via SUBCUTANEOUS
  Filled 2021-03-25: qty 0.9

## 2021-03-25 MED ORDER — PROCHLORPERAZINE MALEATE 10 MG PO TABS
ORAL_TABLET | ORAL | Status: AC
  Filled 2021-03-25: qty 1

## 2021-03-25 MED ORDER — DEXAMETHASONE 4 MG PO TABS
ORAL_TABLET | ORAL | Status: AC
  Filled 2021-03-25: qty 10

## 2021-03-25 MED ORDER — DEXAMETHASONE 4 MG PO TABS
40.0000 mg | ORAL_TABLET | Freq: Once | ORAL | Status: AC
  Administered 2021-03-25: 40 mg via ORAL

## 2021-03-25 NOTE — Patient Instructions (Signed)
[facility] Discharge Instructions for Patients Receiving Chemotherapy  Today you received the following chemotherapy agents   To help prevent nausea and vomiting after your treatment, we encourage you to take your nausea medication   If you develop nausea and vomiting that is not controlled by your nausea medication, call the clinic.   BELOW ARE SYMPTOMS THAT SHOULD BE REPORTED IMMEDIATELY:  *FEVER GREATER THAN 100.5 F  *CHILLS WITH OR WITHOUT FEVER  NAUSEA AND VOMITING THAT IS NOT CONTROLLED WITH YOUR NAUSEA MEDICATION  *UNUSUAL SHORTNESS OF BREATH  *UNUSUAL BRUISING OR BLEEDING  TENDERNESS IN MOUTH AND THROAT WITH OR WITHOUT PRESENCE OF ULCERS  *URINARY PROBLEMS  *BOWEL PROBLEMS  UNUSUAL RASH Items with * indicate a potential emergency and should be followed up as soon as possible.  Feel free to call the clinic should you have any questions or concerns. The clinic phone number is [phone].  Please show the CHEMO ALERT CARD at check-in to the Emergency Department and triage nurse.   

## 2021-03-25 NOTE — Progress Notes (Signed)
Labs reviewed today. No new changes with her medications. No new complaints from patient today. Labs meet parameters for velcade injection today. Hemoglobin 10.4, will hold reticrit today per parameters.   B12 injection and velcade injection given today per oders. See MAR for details.   Treatment given per orders. Patient tolerated it well without problems. Vitals stable and discharged home from clinic ambulatory. Follow up as scheduled.

## 2021-04-01 ENCOUNTER — Other Ambulatory Visit: Payer: Self-pay

## 2021-04-01 ENCOUNTER — Inpatient Hospital Stay (HOSPITAL_COMMUNITY): Payer: Medicare Other

## 2021-04-01 ENCOUNTER — Encounter (HOSPITAL_COMMUNITY): Payer: Self-pay

## 2021-04-01 ENCOUNTER — Inpatient Hospital Stay (HOSPITAL_COMMUNITY): Payer: Medicare Other | Attending: Hematology

## 2021-04-01 ENCOUNTER — Ambulatory Visit (HOSPITAL_COMMUNITY): Payer: Medicare Other

## 2021-04-01 VITALS — BP 130/61 | HR 75 | Temp 97.2°F | Resp 18 | Wt 166.0 lb

## 2021-04-01 DIAGNOSIS — Z5112 Encounter for antineoplastic immunotherapy: Secondary | ICD-10-CM | POA: Insufficient documentation

## 2021-04-01 DIAGNOSIS — C9000 Multiple myeloma not having achieved remission: Secondary | ICD-10-CM

## 2021-04-01 LAB — CBC WITH DIFFERENTIAL/PLATELET
Abs Immature Granulocytes: 0.04 10*3/uL (ref 0.00–0.07)
Basophils Absolute: 0 10*3/uL (ref 0.0–0.1)
Basophils Relative: 1 %
Eosinophils Absolute: 0.1 10*3/uL (ref 0.0–0.5)
Eosinophils Relative: 2 %
HCT: 33.7 % — ABNORMAL LOW (ref 36.0–46.0)
Hemoglobin: 10.9 g/dL — ABNORMAL LOW (ref 12.0–15.0)
Immature Granulocytes: 1 %
Lymphocytes Relative: 18 %
Lymphs Abs: 1.1 10*3/uL (ref 0.7–4.0)
MCH: 33.3 pg (ref 26.0–34.0)
MCHC: 32.3 g/dL (ref 30.0–36.0)
MCV: 103.1 fL — ABNORMAL HIGH (ref 80.0–100.0)
Monocytes Absolute: 0.8 10*3/uL (ref 0.1–1.0)
Monocytes Relative: 14 %
Neutro Abs: 4 10*3/uL (ref 1.7–7.7)
Neutrophils Relative %: 64 %
Platelets: 147 10*3/uL — ABNORMAL LOW (ref 150–400)
RBC: 3.27 MIL/uL — ABNORMAL LOW (ref 3.87–5.11)
RDW: 15.5 % (ref 11.5–15.5)
WBC: 6 10*3/uL (ref 4.0–10.5)
nRBC: 0 % (ref 0.0–0.2)

## 2021-04-01 LAB — COMPREHENSIVE METABOLIC PANEL
ALT: 9 U/L (ref 0–44)
AST: 12 U/L — ABNORMAL LOW (ref 15–41)
Albumin: 3.7 g/dL (ref 3.5–5.0)
Alkaline Phosphatase: 64 U/L (ref 38–126)
Anion gap: 12 (ref 5–15)
BUN: 18 mg/dL (ref 8–23)
CO2: 25 mmol/L (ref 22–32)
Calcium: 9.8 mg/dL (ref 8.9–10.3)
Chloride: 105 mmol/L (ref 98–111)
Creatinine, Ser: 1.11 mg/dL — ABNORMAL HIGH (ref 0.44–1.00)
GFR, Estimated: 50 mL/min — ABNORMAL LOW (ref >60)
Glucose, Bld: 71 mg/dL (ref 70–99)
Potassium: 3.3 mmol/L — ABNORMAL LOW (ref 3.5–5.1)
Sodium: 142 mmol/L (ref 135–145)
Total Bilirubin: 0.9 mg/dL (ref 0.3–1.2)
Total Protein: 6.8 g/dL (ref 6.5–8.1)

## 2021-04-01 LAB — LACTATE DEHYDROGENASE: LDH: 131 U/L (ref 98–192)

## 2021-04-01 MED ORDER — BORTEZOMIB CHEMO SQ INJECTION 3.5 MG (2.5MG/ML)
1.3000 mg/m2 | Freq: Once | INTRAMUSCULAR | Status: AC
  Administered 2021-04-01: 2.25 mg via SUBCUTANEOUS
  Filled 2021-04-01: qty 0.9

## 2021-04-01 MED ORDER — DEXAMETHASONE 4 MG PO TABS
40.0000 mg | ORAL_TABLET | Freq: Once | ORAL | Status: AC
  Administered 2021-04-01: 40 mg via ORAL
  Filled 2021-04-01: qty 10

## 2021-04-01 MED ORDER — PROCHLORPERAZINE MALEATE 10 MG PO TABS
10.0000 mg | ORAL_TABLET | Freq: Once | ORAL | Status: AC
Start: 2021-04-01 — End: 2021-04-01
  Administered 2021-04-01: 10 mg via ORAL
  Filled 2021-04-01: qty 1

## 2021-04-01 NOTE — Patient Instructions (Signed)
[facility] Discharge Instructions for Patients Receiving Chemotherapy  Today you received the following chemotherapy agents   To help prevent nausea and vomiting after your treatment, we encourage you to take your nausea medication   If you develop nausea and vomiting that is not controlled by your nausea medication, call the clinic.   BELOW ARE SYMPTOMS THAT SHOULD BE REPORTED IMMEDIATELY:  *FEVER GREATER THAN 100.5 F  *CHILLS WITH OR WITHOUT FEVER  NAUSEA AND VOMITING THAT IS NOT CONTROLLED WITH YOUR NAUSEA MEDICATION  *UNUSUAL SHORTNESS OF BREATH  *UNUSUAL BRUISING OR BLEEDING  TENDERNESS IN MOUTH AND THROAT WITH OR WITHOUT PRESENCE OF ULCERS  *URINARY PROBLEMS  *BOWEL PROBLEMS  UNUSUAL RASH Items with * indicate a potential emergency and should be followed up as soon as possible.  Feel free to call the clinic should you have any questions or concerns. The clinic phone number is [phone].  Please show the CHEMO ALERT CARD at check-in to the Emergency Department and triage nurse.   

## 2021-04-01 NOTE — Progress Notes (Signed)
Labs meet parameters for Velcade injection today. No new issues noted by patient.   Hemoglobin 10.9 today, held reticrit per protocol.  Treatment given per orders. Patient tolerated it well without problems. Vitals stable and discharged home from clinic ambulatory. Follow up as scheduled.

## 2021-04-15 ENCOUNTER — Inpatient Hospital Stay (HOSPITAL_COMMUNITY): Payer: Medicare Other

## 2021-04-15 ENCOUNTER — Other Ambulatory Visit: Payer: Self-pay

## 2021-04-15 VITALS — BP 148/59 | HR 56 | Temp 97.0°F | Resp 18 | Wt 165.6 lb

## 2021-04-15 DIAGNOSIS — C9000 Multiple myeloma not having achieved remission: Secondary | ICD-10-CM

## 2021-04-15 DIAGNOSIS — Z5112 Encounter for antineoplastic immunotherapy: Secondary | ICD-10-CM | POA: Diagnosis not present

## 2021-04-15 LAB — CBC WITH DIFFERENTIAL/PLATELET
Abs Immature Granulocytes: 0.01 10*3/uL (ref 0.00–0.07)
Basophils Absolute: 0.1 10*3/uL (ref 0.0–0.1)
Basophils Relative: 1 %
Eosinophils Absolute: 0.1 10*3/uL (ref 0.0–0.5)
Eosinophils Relative: 3 %
HCT: 32.3 % — ABNORMAL LOW (ref 36.0–46.0)
Hemoglobin: 10.3 g/dL — ABNORMAL LOW (ref 12.0–15.0)
Immature Granulocytes: 0 %
Lymphocytes Relative: 25 %
Lymphs Abs: 1 10*3/uL (ref 0.7–4.0)
MCH: 32.5 pg (ref 26.0–34.0)
MCHC: 31.9 g/dL (ref 30.0–36.0)
MCV: 101.9 fL — ABNORMAL HIGH (ref 80.0–100.0)
Monocytes Absolute: 0.5 10*3/uL (ref 0.1–1.0)
Monocytes Relative: 12 %
Neutro Abs: 2.4 10*3/uL (ref 1.7–7.7)
Neutrophils Relative %: 59 %
Platelets: 234 10*3/uL (ref 150–400)
RBC: 3.17 MIL/uL — ABNORMAL LOW (ref 3.87–5.11)
RDW: 14.8 % (ref 11.5–15.5)
WBC: 4 10*3/uL (ref 4.0–10.5)
nRBC: 0 % (ref 0.0–0.2)

## 2021-04-15 LAB — COMPREHENSIVE METABOLIC PANEL
ALT: 9 U/L (ref 0–44)
AST: 12 U/L — ABNORMAL LOW (ref 15–41)
Albumin: 3.4 g/dL — ABNORMAL LOW (ref 3.5–5.0)
Alkaline Phosphatase: 63 U/L (ref 38–126)
Anion gap: 8 (ref 5–15)
BUN: 20 mg/dL (ref 8–23)
CO2: 26 mmol/L (ref 22–32)
Calcium: 9.9 mg/dL (ref 8.9–10.3)
Chloride: 107 mmol/L (ref 98–111)
Creatinine, Ser: 0.97 mg/dL (ref 0.44–1.00)
GFR, Estimated: 59 mL/min — ABNORMAL LOW (ref >60)
Glucose, Bld: 124 mg/dL — ABNORMAL HIGH (ref 70–99)
Potassium: 3.1 mmol/L — ABNORMAL LOW (ref 3.5–5.1)
Sodium: 141 mmol/L (ref 135–145)
Total Bilirubin: 0.7 mg/dL (ref 0.3–1.2)
Total Protein: 6.6 g/dL (ref 6.5–8.1)

## 2021-04-15 MED ORDER — BORTEZOMIB CHEMO SQ INJECTION 3.5 MG (2.5MG/ML)
1.3000 mg/m2 | Freq: Once | INTRAMUSCULAR | Status: AC
  Administered 2021-04-15: 2.25 mg via SUBCUTANEOUS
  Filled 2021-04-15: qty 0.9

## 2021-04-15 MED ORDER — POTASSIUM CHLORIDE CRYS ER 20 MEQ PO TBCR
40.0000 meq | EXTENDED_RELEASE_TABLET | Freq: Once | ORAL | Status: AC
  Administered 2021-04-15: 40 meq via ORAL

## 2021-04-15 MED ORDER — PROCHLORPERAZINE MALEATE 10 MG PO TABS
10.0000 mg | ORAL_TABLET | Freq: Once | ORAL | Status: AC
  Administered 2021-04-15: 10 mg via ORAL
  Filled 2021-04-15: qty 1

## 2021-04-15 MED ORDER — POTASSIUM CHLORIDE CRYS ER 20 MEQ PO TBCR
EXTENDED_RELEASE_TABLET | ORAL | Status: AC
  Filled 2021-04-15: qty 2

## 2021-04-15 MED ORDER — DEXAMETHASONE 4 MG PO TABS
40.0000 mg | ORAL_TABLET | Freq: Once | ORAL | Status: AC
  Administered 2021-04-15: 40 mg via ORAL
  Filled 2021-04-15: qty 10

## 2021-04-15 NOTE — Patient Instructions (Signed)
[facility] Discharge Instructions for Patients Receiving Chemotherapy  Today you received the following chemotherapy agents Velcade injection.   To help prevent nausea and vomiting after your treatment, we encourage you to take your nausea medication.   If you develop nausea and vomiting that is not controlled by your nausea medication, call the clinic.   BELOW ARE SYMPTOMS THAT SHOULD BE REPORTED IMMEDIATELY:  *FEVER GREATER THAN 100.5 F  *CHILLS WITH OR WITHOUT FEVER  NAUSEA AND VOMITING THAT IS NOT CONTROLLED WITH YOUR NAUSEA MEDICATION  *UNUSUAL SHORTNESS OF BREATH  *UNUSUAL BRUISING OR BLEEDING  TENDERNESS IN MOUTH AND THROAT WITH OR WITHOUT PRESENCE OF ULCERS  *URINARY PROBLEMS  *BOWEL PROBLEMS  UNUSUAL RASH Items with * indicate a potential emergency and should be followed up as soon as possible.  Feel free to call the clinic should you have any questions or concerns. The clinic phone number is [phone].  Please show the [facility] at check-in to the Emergency Department and triage nurse.

## 2021-04-15 NOTE — Progress Notes (Signed)
Patient presents today for Velcade and Retacrit injection. Potassium today 3.1. Patient states she is taking her Potassium at home. Vital signs within parameters for treatment. Labs within parameters for treatment. NO Retacrit today. HGB 10.3 today.   Verbal order received 40 mEq PO Potassium x 1 dose now.   Velcade given today per MD orders. Tolerated without adverse affects. Vital signs stable. No complaints at this time. Discharged from clinic ambulatory in stable condition. Alert and oriented x 3. F/U with [facility] as scheduled.

## 2021-04-21 NOTE — Op Note (Signed)
03/19/2021  12:13 PM  PATIENT:  Brittany Archer  80 y.o. 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 Bleeding bone encountered following ostectomy. icell tissue graft placed over bone  Indications for procedure: 80 y/o with right mandibular MRONJ, h/o prolia injections, presents for mandibular debridement/tissue closure.  Procedure:   The patient was identified in the preoperative holding by both anesthesia and the maxillofacial team.  Health history was reviewed.  The patient was brought back to the operating room and placed on table in the supine position.  Standard ASA leads and monitors were placed.  The patient was preoxygenated, induced, and her airway was protected with a nasal endotracheal tube.  The tube was secured by the anesthesia care team.  A throat pack was placed.  The patient was then injected with 1% lidocaine with 1 100,000 epinephrine in the right mandible.  Initial observation showing a 4.5 x 2 cm area of bone exposure with obvious osteonecrosis.  A flap was elevated to further expose the mandible.  A large osseous bur under irrigation was utilized for ostectomy to remove the necrotic bone.  Specimens were obtained and submitted for pathologic examination.  Continued ostectomy was performed down to viable bone.  Once this was encountered the margins were thoroughly debrided and smoothed.  Icell wound coverage was placed over the exposed bone.  The tissues were then reapproximated and closed primarily with multiple 3-0 Vicryl sutures.  Complete closure  was obtained.  The oral cavity was then thoroughly irrigated.  The throat pack was removed.  The patient was then returned to the anesthesia care team where she was extubated without event.  She was transported to the postanesthesia care unit for recovery, and will be discharged once meeting appropriate criteria.  Maxie Better, DMD Oral & Maxillofacial Surgery

## 2021-04-22 ENCOUNTER — Other Ambulatory Visit: Payer: Self-pay

## 2021-04-22 ENCOUNTER — Inpatient Hospital Stay (HOSPITAL_COMMUNITY): Payer: Medicare Other

## 2021-04-22 ENCOUNTER — Encounter (HOSPITAL_COMMUNITY): Payer: Self-pay

## 2021-04-22 VITALS — BP 146/69 | HR 65 | Temp 97.1°F | Resp 17 | Wt 165.8 lb

## 2021-04-22 DIAGNOSIS — C9000 Multiple myeloma not having achieved remission: Secondary | ICD-10-CM

## 2021-04-22 DIAGNOSIS — Z5112 Encounter for antineoplastic immunotherapy: Secondary | ICD-10-CM | POA: Diagnosis not present

## 2021-04-22 LAB — CBC WITH DIFFERENTIAL/PLATELET
Abs Immature Granulocytes: 0.01 10*3/uL (ref 0.00–0.07)
Basophils Absolute: 0 10*3/uL (ref 0.0–0.1)
Basophils Relative: 1 %
Eosinophils Absolute: 0.1 10*3/uL (ref 0.0–0.5)
Eosinophils Relative: 3 %
HCT: 30.6 % — ABNORMAL LOW (ref 36.0–46.0)
Hemoglobin: 10.2 g/dL — ABNORMAL LOW (ref 12.0–15.0)
Immature Granulocytes: 0 %
Lymphocytes Relative: 30 %
Lymphs Abs: 0.9 10*3/uL (ref 0.7–4.0)
MCH: 33.9 pg (ref 26.0–34.0)
MCHC: 33.3 g/dL (ref 30.0–36.0)
MCV: 101.7 fL — ABNORMAL HIGH (ref 80.0–100.0)
Monocytes Absolute: 0.2 10*3/uL (ref 0.1–1.0)
Monocytes Relative: 8 %
Neutro Abs: 1.8 10*3/uL (ref 1.7–7.7)
Neutrophils Relative %: 58 %
Platelets: 133 10*3/uL — ABNORMAL LOW (ref 150–400)
RBC: 3.01 MIL/uL — ABNORMAL LOW (ref 3.87–5.11)
RDW: 15.3 % (ref 11.5–15.5)
WBC: 3.1 10*3/uL — ABNORMAL LOW (ref 4.0–10.5)
nRBC: 0 % (ref 0.0–0.2)

## 2021-04-22 LAB — COMPREHENSIVE METABOLIC PANEL
ALT: 10 U/L (ref 0–44)
AST: 12 U/L — ABNORMAL LOW (ref 15–41)
Albumin: 3.3 g/dL — ABNORMAL LOW (ref 3.5–5.0)
Alkaline Phosphatase: 62 U/L (ref 38–126)
Anion gap: 7 (ref 5–15)
BUN: 15 mg/dL (ref 8–23)
CO2: 23 mmol/L (ref 22–32)
Calcium: 9.4 mg/dL (ref 8.9–10.3)
Chloride: 110 mmol/L (ref 98–111)
Creatinine, Ser: 0.93 mg/dL (ref 0.44–1.00)
GFR, Estimated: >60 mL/min (ref >60)
Glucose, Bld: 112 mg/dL — ABNORMAL HIGH (ref 70–99)
Potassium: 3.6 mmol/L (ref 3.5–5.1)
Sodium: 140 mmol/L (ref 135–145)
Total Bilirubin: 0.8 mg/dL (ref 0.3–1.2)
Total Protein: 6.1 g/dL — ABNORMAL LOW (ref 6.5–8.1)

## 2021-04-22 MED ORDER — DEXAMETHASONE 4 MG PO TABS
40.0000 mg | ORAL_TABLET | Freq: Once | ORAL | Status: AC
  Administered 2021-04-22: 40 mg via ORAL
  Filled 2021-04-22: qty 10

## 2021-04-22 MED ORDER — BORTEZOMIB CHEMO SQ INJECTION 3.5 MG (2.5MG/ML)
1.3000 mg/m2 | Freq: Once | INTRAMUSCULAR | Status: AC
  Administered 2021-04-22: 2.25 mg via SUBCUTANEOUS
  Filled 2021-04-22: qty 0.9

## 2021-04-22 MED ORDER — PROCHLORPERAZINE MALEATE 10 MG PO TABS
10.0000 mg | ORAL_TABLET | Freq: Once | ORAL | Status: AC
  Administered 2021-04-22: 10 mg via ORAL
  Filled 2021-04-22: qty 1

## 2021-04-22 NOTE — Patient Instructions (Signed)
[facility]  Discharge Instructions: Thank you for choosing [facility] to provide your oncology and hematology care.  If you have a lab appointment with the Cancer Center, please come in thru the Main Entrance and check in at the main information desk.  Wear comfortable clothing and clothing appropriate for easy access to any Portacath or PICC line.   We strive to give you quality time with your provider. You may need to reschedule your appointment if you arrive late (15 or more minutes).  Arriving late affects you and other patients whose appointments are after yours.  Also, if you miss three or more appointments without notifying the office, you may be dismissed from the clinic at the provider's discretion.      For prescription refill requests, have your pharmacy contact our office and allow 72 hours for refills to be completed.    Today you received the following chemotherapy and/or immunotherapy agents Velcade.       To help prevent nausea and vomiting after your treatment, we encourage you to take your nausea medication as directed.  BELOW ARE SYMPTOMS THAT SHOULD BE REPORTED IMMEDIATELY: . *FEVER GREATER THAN 100.4 F (38 C) OR HIGHER . *CHILLS OR SWEATING . *NAUSEA AND VOMITING THAT IS NOT CONTROLLED WITH YOUR NAUSEA MEDICATION . *UNUSUAL SHORTNESS OF BREATH . *UNUSUAL BRUISING OR BLEEDING . *URINARY PROBLEMS (pain or burning when urinating, or frequent urination) . *BOWEL PROBLEMS (unusual diarrhea, constipation, pain near the anus) . TENDERNESS IN MOUTH AND THROAT WITH OR WITHOUT PRESENCE OF ULCERS (sore throat, sores in mouth, or a toothache) . UNUSUAL RASH, SWELLING OR PAIN  . UNUSUAL VAGINAL DISCHARGE OR ITCHING   Items with * indicate a potential emergency and should be followed up as soon as possible or go to the Emergency Department if any problems should occur.  Please show the CHEMOTHERAPY ALERT CARD or IMMUNOTHERAPY ALERT CARD at check-in to  the Emergency Department and triage nurse.  Should you have questions after your visit or need to cancel or reschedule your appointment, please contact [facility] [phone]  and follow the prompts.  Office hours are 8:00 a.m. to 4:30 p.m. Monday - Friday. Please note that voicemails left after 4:00 p.m. may not be returned until the following business day.  We are closed weekends and major holidays. You have access to a nurse at all times for urgent questions. Please call the main number to the clinic [phone] and follow the prompts.  For any non-urgent questions, you may also contact your provider using MyChart. We now offer e-Visits for anyone 18 and older to request care online for non-urgent symptoms. For details visit [email or web address].   Also download the MyChart app! Go to the app store, search "MyChart", open the app, select [facility], and log in with your MyChart username and password.  Due to Covid, a mask is required upon entering the hospital/clinic. If you do not have a mask, one will be given to you upon arrival. For doctor visits, patients may have 1 support person aged 18 or older with them. For treatment visits, patients cannot have anyone with them due to current Covid guidelines and our immunocompromised population.  

## 2021-04-22 NOTE — Progress Notes (Signed)
Patient tolerated Velcade injection with no complaints voiced. Lab work reviewed. See MAR for details. Injection site clean and dry with no bruising or swelling noted. Patient stable during and after injection. Band aid applied. VSS. Patient left in satisfactory condition with no s/s of distress noted. 

## 2021-04-25 ENCOUNTER — Other Ambulatory Visit (HOSPITAL_COMMUNITY): Payer: Self-pay | Admitting: Hematology

## 2021-04-29 ENCOUNTER — Inpatient Hospital Stay (HOSPITAL_COMMUNITY): Payer: Medicare Other | Attending: Hematology

## 2021-04-29 ENCOUNTER — Other Ambulatory Visit: Payer: Self-pay

## 2021-04-29 ENCOUNTER — Inpatient Hospital Stay (HOSPITAL_COMMUNITY): Payer: Medicare Other

## 2021-04-29 VITALS — BP 139/55 | HR 78 | Temp 97.0°F | Resp 18 | Wt 166.8 lb

## 2021-04-29 DIAGNOSIS — C9000 Multiple myeloma not having achieved remission: Secondary | ICD-10-CM

## 2021-04-29 DIAGNOSIS — N189 Chronic kidney disease, unspecified: Secondary | ICD-10-CM | POA: Insufficient documentation

## 2021-04-29 DIAGNOSIS — E538 Deficiency of other specified B group vitamins: Secondary | ICD-10-CM | POA: Insufficient documentation

## 2021-04-29 DIAGNOSIS — Z5112 Encounter for antineoplastic immunotherapy: Secondary | ICD-10-CM | POA: Diagnosis not present

## 2021-04-29 DIAGNOSIS — D631 Anemia in chronic kidney disease: Secondary | ICD-10-CM | POA: Diagnosis not present

## 2021-04-29 LAB — COMPREHENSIVE METABOLIC PANEL
ALT: 9 U/L (ref 0–44)
AST: 13 U/L — ABNORMAL LOW (ref 15–41)
Albumin: 3.5 g/dL (ref 3.5–5.0)
Alkaline Phosphatase: 65 U/L (ref 38–126)
Anion gap: 6 (ref 5–15)
BUN: 10 mg/dL (ref 8–23)
CO2: 23 mmol/L (ref 22–32)
Calcium: 9.7 mg/dL (ref 8.9–10.3)
Chloride: 110 mmol/L (ref 98–111)
Creatinine, Ser: 1.07 mg/dL — ABNORMAL HIGH (ref 0.44–1.00)
GFR, Estimated: 52 mL/min — ABNORMAL LOW (ref >60)
Glucose, Bld: 106 mg/dL — ABNORMAL HIGH (ref 70–99)
Potassium: 3.4 mmol/L — ABNORMAL LOW (ref 3.5–5.1)
Sodium: 139 mmol/L (ref 135–145)
Total Bilirubin: 0.7 mg/dL (ref 0.3–1.2)
Total Protein: 6 g/dL — ABNORMAL LOW (ref 6.5–8.1)

## 2021-04-29 LAB — CBC WITH DIFFERENTIAL/PLATELET
Abs Immature Granulocytes: 0.03 10*3/uL (ref 0.00–0.07)
Basophils Absolute: 0 10*3/uL (ref 0.0–0.1)
Basophils Relative: 0 %
Eosinophils Absolute: 0.1 10*3/uL (ref 0.0–0.5)
Eosinophils Relative: 2 %
HCT: 30.9 % — ABNORMAL LOW (ref 36.0–46.0)
Hemoglobin: 10.2 g/dL — ABNORMAL LOW (ref 12.0–15.0)
Immature Granulocytes: 1 %
Lymphocytes Relative: 24 %
Lymphs Abs: 1.2 10*3/uL (ref 0.7–4.0)
MCH: 33.3 pg (ref 26.0–34.0)
MCHC: 33 g/dL (ref 30.0–36.0)
MCV: 101 fL — ABNORMAL HIGH (ref 80.0–100.0)
Monocytes Absolute: 0.4 10*3/uL (ref 0.1–1.0)
Monocytes Relative: 8 %
Neutro Abs: 3.2 10*3/uL (ref 1.7–7.7)
Neutrophils Relative %: 65 %
Platelets: 116 10*3/uL — ABNORMAL LOW (ref 150–400)
RBC: 3.06 MIL/uL — ABNORMAL LOW (ref 3.87–5.11)
RDW: 15.5 % (ref 11.5–15.5)
WBC: 4.9 10*3/uL (ref 4.0–10.5)
nRBC: 0 % (ref 0.0–0.2)

## 2021-04-29 LAB — LACTATE DEHYDROGENASE: LDH: 136 U/L (ref 98–192)

## 2021-04-29 MED ORDER — BORTEZOMIB CHEMO SQ INJECTION 3.5 MG (2.5MG/ML)
1.3000 mg/m2 | Freq: Once | INTRAMUSCULAR | Status: AC
  Administered 2021-04-29: 2.25 mg via SUBCUTANEOUS
  Filled 2021-04-29: qty 0.9

## 2021-04-29 MED ORDER — DEXAMETHASONE 4 MG PO TABS
40.0000 mg | ORAL_TABLET | Freq: Once | ORAL | Status: AC
  Administered 2021-04-29: 40 mg via ORAL
  Filled 2021-04-29: qty 10

## 2021-04-29 MED ORDER — PROCHLORPERAZINE MALEATE 10 MG PO TABS
10.0000 mg | ORAL_TABLET | Freq: Once | ORAL | Status: AC
  Administered 2021-04-29: 10 mg via ORAL
  Filled 2021-04-29: qty 1

## 2021-04-29 MED ORDER — CYANOCOBALAMIN 1000 MCG/ML IJ SOLN
1000.0000 ug | Freq: Once | INTRAMUSCULAR | Status: AC
  Administered 2021-04-29: 1000 ug via INTRAMUSCULAR
  Filled 2021-04-29: qty 1

## 2021-04-29 NOTE — Patient Instructions (Signed)
[facility]  Discharge Instructions: °Thank you for choosing [facility] to provide your oncology and hematology care.  °If you have a lab appointment with the Cancer Center, please come in thru the Main Entrance and check in at the main information desk. ° °Wear comfortable clothing and clothing appropriate for easy access to any Portacath or PICC line.  ° °We strive to give you quality time with your provider. You may need to reschedule your appointment if you arrive late (15 or more minutes).  Arriving late affects you and other patients whose appointments are after yours.  Also, if you miss three or more appointments without notifying the office, you may be dismissed from the clinic at the provider’s discretion.    °  °For prescription refill requests, have your pharmacy contact our office and allow 72 hours for refills to be completed.   ° °Today you received the following chemotherapy and/or immunotherapy agents Velcade and B12    °  °To help prevent nausea and vomiting after your treatment, we encourage you to take your nausea medication as directed. ° °BELOW ARE SYMPTOMS THAT SHOULD BE REPORTED IMMEDIATELY: °*FEVER GREATER THAN 100.4 F (38 °C) OR HIGHER °*CHILLS OR SWEATING °*NAUSEA AND VOMITING THAT IS NOT CONTROLLED WITH YOUR NAUSEA MEDICATION °*UNUSUAL SHORTNESS OF BREATH °*UNUSUAL BRUISING OR BLEEDING °*URINARY PROBLEMS (pain or burning when urinating, or frequent urination) °*BOWEL PROBLEMS (unusual diarrhea, constipation, pain near the anus) °TENDERNESS IN MOUTH AND THROAT WITH OR WITHOUT PRESENCE OF ULCERS (sore throat, sores in mouth, or a toothache) °UNUSUAL RASH, SWELLING OR PAIN  °UNUSUAL VAGINAL DISCHARGE OR ITCHING  ° °Items with * indicate a potential emergency and should be followed up as soon as possible or go to the Emergency Department if any problems should occur. ° °Please show the CHEMOTHERAPY ALERT CARD or IMMUNOTHERAPY ALERT CARD at check-in to the Emergency  Department and triage nurse. ° °Should you have questions after your visit or need to cancel or reschedule your appointment, please contact [facility] [phone]  and follow the prompts.  Office hours are 8:00 a.m. to 4:30 p.m. Monday - Friday. Please note that voicemails left after 4:00 p.m. may not be returned until the following business day.  We are closed weekends and major holidays. You have access to a nurse at all times for urgent questions. Please call the main number to the clinic [phone] and follow the prompts. ° °For any non-urgent questions, you may also contact your provider using MyChart. We now offer e-Visits for anyone 18 and older to request care online for non-urgent symptoms. For details visit [email or web address]. °  °Also download the MyChart app! Go to the app store, search "MyChart", open the app, select [facility], and log in with your MyChart username and password. ° °Due to Covid, a mask is required upon entering the hospital/clinic. If you do not have a mask, one will be given to you upon arrival. For doctor visits, patients may have 1 support person aged 18 or older with them. For treatment visits, patients cannot have anyone with them due to current Covid guidelines and our immunocompromised population.  °

## 2021-04-29 NOTE — Progress Notes (Signed)
Patient presents today for Velcade, Retacrit, and B12 injections.  Labs reviewed and HGB noted to be 10.2.  Vital signs within parameters for treatment.  Patient has no new complaints since last visit.  No Retacrit due to HGB 10.2.  Velcade and B12 injections given today per MD orders.  Stable during injections without adverse affects.  Injections sites WNL.  Vital signs stable.  No complaints at this time.  Discharge from clinic ambulatory in stable condition.  Alert and oriented X 3.  Follow up with [facility] as scheduled.

## 2021-04-30 LAB — KAPPA/LAMBDA LIGHT CHAINS
Kappa free light chain: 20.3 mg/L — ABNORMAL HIGH (ref 3.3–19.4)
Kappa, lambda light chain ratio: 2.09 — ABNORMAL HIGH (ref 0.26–1.65)
Lambda free light chains: 9.7 mg/L (ref 5.7–26.3)

## 2021-05-01 LAB — PROTEIN ELECTROPHORESIS, SERUM
A/G Ratio: 1.5 (ref 0.7–1.7)
Albumin ELP: 3.5 g/dL (ref 2.9–4.4)
Alpha-1-Globulin: 0.1 g/dL (ref 0.0–0.4)
Alpha-2-Globulin: 0.7 g/dL (ref 0.4–1.0)
Beta Globulin: 0.9 g/dL (ref 0.7–1.3)
Gamma Globulin: 0.6 g/dL (ref 0.4–1.8)
Globulin, Total: 2.3 g/dL (ref 2.2–3.9)
M-Spike, %: 0.3 g/dL — ABNORMAL HIGH
Total Protein ELP: 5.8 g/dL — ABNORMAL LOW (ref 6.0–8.5)

## 2021-05-03 LAB — IMMUNOFIXATION ELECTROPHORESIS
IgA: 265 mg/dL (ref 64–422)
IgG (Immunoglobin G), Serum: 698 mg/dL (ref 586–1602)
IgM (Immunoglobulin M), Srm: 88 mg/dL (ref 26–217)
Total Protein ELP: 5.7 g/dL — ABNORMAL LOW (ref 6.0–8.5)

## 2021-05-09 ENCOUNTER — Other Ambulatory Visit (HOSPITAL_COMMUNITY): Payer: Self-pay | Admitting: *Deleted

## 2021-05-12 ENCOUNTER — Other Ambulatory Visit (HOSPITAL_COMMUNITY): Payer: Self-pay

## 2021-05-12 DIAGNOSIS — C9000 Multiple myeloma not having achieved remission: Secondary | ICD-10-CM

## 2021-05-12 MED ORDER — LENALIDOMIDE 20 MG PO CAPS: ORAL_CAPSULE | ORAL | 0 refills | Status: DC

## 2021-05-12 NOTE — Telephone Encounter (Signed)
Chart reviewed. Revlimid refilled per Dr. Delton Coombes

## 2021-05-12 NOTE — Progress Notes (Signed)
[facility] [address], [place] [address]   CLINIC:  Medical Oncology/Hematology  PCP:  [name], MD [address] / [place] [address] [phone]   REASON FOR VISIT:  Follow-up for multiple myeloma  PRIOR THERAPY: none  NGS Results: not done  CURRENT THERAPY: Velcade 3/4 weeks; Revlimid 20 mg 2/3 weeks  BRIEF ONCOLOGIC HISTORY:  Oncology History  Multiple myeloma not having achieved remission ([place])  01/20/2018 Initial Diagnosis   Multiple myeloma not having achieved remission ([place])   01/26/2018 -  Chemotherapy    Patient is on Treatment Plan: MYELOMA  RVD SQ (BORTEZOMIB D 1,8,15 ) Q28D X 4 CYCLES        CANCER STAGING: Cancer Staging No matching staging information was found for the patient.  INTERVAL HISTORY:  [name], a 80 y.o. female, returns for routine follow-up and consideration for next cycle of chemotherapy. Brittany Archer was last seen on 03/18/2021.  Due for cycle #42 of bortezomib (VELCADE) today.   Overall, she tells me she has been feeling pretty well. She denies n/v/d/c, cough, or numbness/tingling.    Overall, she feels ready for next cycle of chemo today.    REVIEW OF SYSTEMS:  Review of Systems  Constitutional: Positive for appetite change (75%) and fatigue (75%).  Respiratory: Negative for cough.   Gastrointestinal: Negative for constipation, diarrhea, nausea and vomiting.  Neurological: Negative for numbness.  All other systems reviewed and are negative.   PAST MEDICAL/SURGICAL HISTORY:  Past Medical History:  Diagnosis Date  . Breast cancer ([place])    left breast/ 2008/ surg/ rad tx  . Coronary artery disease   . Diabetes mellitus    Past Surgical History:  Procedure Laterality Date  . ABDOMINAL HYSTERECTOMY    . BREAST SURGERY    . DEBRIDEMENT MANDIBLE N/A 02/22/2020   Procedure: INCISION AND DRAINAGE WITH DEBRIDEMENT MANDIBLE;  Surgeon: Michael Litter, DMD;  Location: [facility];   Service: Oral Surgery;  Laterality: N/A;  . DEBRIDEMENT MANDIBLE Right 03/19/2021   Procedure: DEBRIDEMENT OF BONE RIGHT INTERIOR  MANDIBLE;  Surgeon: Michael Litter, DMD;  Location: [facility];  Service: Oral Surgery;  Laterality: Right;  . EYE SURGERY  2021   cataract removals   . TOOTH EXTRACTION N/A 02/22/2020   Procedure: DENTAL RESTORATION/EXTRACTIONS;  Surgeon: Michael Litter, DMD;  Location: [facility];  Service: Oral Surgery;  Laterality: N/A;  DENTAL KIT REQUESTED    SOCIAL HISTORY:  Social History   Socioeconomic History  . Marital status: Divorced    Spouse name: Not on file  . Number of children: Not on file  . Years of education: Not on file  . Highest education level: Not on file  Occupational History  . Not on file  Tobacco Use  . Smoking status: Never Smoker  . Smokeless tobacco: Never Used  Vaping Use  . Vaping Use: Never used  Substance and Sexual Activity  . Alcohol use: No  . Drug use: No  . Sexual activity: Yes    Birth control/protection: Surgical  Other Topics Concern  . Not on file  Social History Narrative  . Not on file   Social Determinants of Health   Financial Resource Strain: Low Risk   . Difficulty of Paying Living Expenses: Not hard at all  Food Insecurity: No Food Insecurity  . Worried About Charity fundraiser in the Last Year: Never true  . Ran Out of Food in the Last Year: Never  true  Transportation Needs: No Transportation Needs  . Lack of Transportation (Medical): No  . Lack of Transportation (Non-Medical): No  Physical Activity: Inactive  . Days of Exercise per Week: 0 days  . Minutes of Exercise per Session: 0 min  Stress: No Stress Concern Present  . Feeling of Stress : Not at all  Social Connections: Moderately Isolated  . Frequency of Communication with Friends and Family: More than three times a week  . Frequency of Social Gatherings with Friends and Family: More than three times a week  . Attends Religious Services: More than 4 times  per year  . Active Member of Clubs or Organizations: No  . Attends Archivist Meetings: Never  . Marital Status: Divorced  Human resources officer Violence: Not At Risk  . Fear of Current or Ex-Partner: No  . Emotionally Abused: No  . Physically Abused: No  . Sexually Abused: No    FAMILY HISTORY:  Family History  Problem Relation Age of Onset  . Obesity Sister     CURRENT MEDICATIONS:  Current Outpatient Medications  Medication Sig Dispense Refill  . acyclovir (ZOVIRAX) 400 MG tablet TAKE 1 TABLET BY MOUTH TWICE DAILY (Patient taking differently: Take 400 mg by mouth 2 (two) times daily.) 60 tablet 11  . aspirin 81 MG tablet Take 81 mg by mouth daily.    . bortezomib IV (VELCADE) 3.5 MG injection Inject 3.5 mg into the vein once a week. weekly    . chlorhexidine (PERIDEX) 0.12 % solution Use as directed 15 mLs in the mouth or throat 3 (three) times daily.    Marland Kitchen dexamethasone (DECADRON) 4 MG tablet Take 10 tablets (40 mg) on days 1, 8, and 15 of chemo. Repeat every 21 days. (Patient taking differently: Take 4 mg by mouth See admin instructions. Take 10 tablets (40 mg) on days 1, 8, and 15 of chemo. Repeat every 21 days.) 30 tablet 3  . glipiZIDE (GLUCOTROL) 5 MG tablet Take 5 mg by mouth daily before breakfast.     . glucose blood (ACCU-CHEK AVIVA PLUS) test strip CHECK BLOOD SUGAR ONCE DAILY    . lenalidomide (REVLIMID) 20 MG capsule Take 20 mg capsule by mouth once daily for 14 days on, and 7 days off of a 21 day cycle. 14 capsule 0  . lisinopril-hydrochlorothiazide (PRINZIDE,ZESTORETIC) 20-25 MG tablet Take 1 tablet by mouth daily.    . magnesium oxide (MAG-OX) 400 (241.3 Mg) MG tablet Take 1 tablet (400 mg total) by mouth in the morning, at noon, and at bedtime. 90 tablet 6  . metFORMIN (GLUCOPHAGE) 1000 MG tablet Take 1,000 mg by mouth 2 (two) times daily with a meal.    . potassium chloride 20 MEQ/15ML (10%) SOLN Take 6 mEq by mouth 3 (three) times daily. 5 ml    . potassium  chloride SA (KLOR-CON) 20 MEQ tablet TAKE (2) TABLETS BY MOUTH THREE TIMES DAILY. 168 tablet 3   No current facility-administered medications for this visit.    ALLERGIES:  Allergies  Allergen Reactions  . Seasonal Ic [Cholestatin] Other (See Comments)    Sneezing, watery eyes  . Motrin [Ibuprofen] Rash    PHYSICAL EXAM:  Performance status (ECOG): 1 - Symptomatic but completely ambulatory  There were no vitals filed for this visit. Wt Readings from Last 3 Encounters:  04/29/21 166 lb 12.8 oz (75.7 kg)  04/22/21 165 lb 12.8 oz (75.2 kg)  04/15/21 165 lb 9.6 oz (75.1 kg)   Physical Exam Vitals  reviewed.  Constitutional:      Appearance: Normal appearance.  Cardiovascular:     Rate and Rhythm: Normal rate and regular rhythm.     Pulses: Normal pulses.     Heart sounds: Normal heart sounds.  Pulmonary:     Effort: Pulmonary effort is normal.     Breath sounds: Normal breath sounds.  Neurological:     General: No focal deficit present.     Mental Status: She is alert and oriented to person, place, and time.  Psychiatric:        Mood and Affect: Mood normal.        Behavior: Behavior normal.     LABORATORY DATA:  I have reviewed the labs as listed.  CBC Latest Ref Rng & Units 04/29/2021 04/22/2021 04/15/2021  WBC 4.0 - 10.5 K/uL 4.9 3.1(L) 4.0  Hemoglobin 12.0 - 15.0 g/dL 10.2(L) 10.2(L) 10.3(L)  Hematocrit 36.0 - 46.0 % 30.9(L) 30.6(L) 32.3(L)  Platelets 150 - 400 K/uL 116(L) 133(L) 234   CMP Latest Ref Rng & Units 04/29/2021 04/22/2021 04/15/2021  Glucose 70 - 99 mg/dL 106(H) 112(H) 124(H)  BUN 8 - 23 mg/dL '10 15 20  ' Creatinine 0.44 - 1.00 mg/dL 1.07(H) 0.93 0.97  Sodium 135 - 145 mmol/L 139 140 141  Potassium 3.5 - 5.1 mmol/L 3.4(L) 3.6 3.1(L)  Chloride 98 - 111 mmol/L 110 110 107  CO2 22 - 32 mmol/L '23 23 26  ' Calcium 8.9 - 10.3 mg/dL 9.7 9.4 9.9  Total Protein 6.5 - 8.1 g/dL 6.0(L) 6.1(L) 6.6  Total Bilirubin 0.3 - 1.2 mg/dL 0.7 0.8 0.7  Alkaline Phos 38 - 126 U/L  65 62 63  AST 15 - 41 U/L 13(L) 12(L) 12(L)  ALT 0 - 44 U/L '9 10 9    ' DIAGNOSTIC IMAGING:  I have independently reviewed the scans and discussed with the patient. No results found.   ASSESSMENT:  1. IgA kappa plasma cell myeloma, stage I: -RVD started on 01/09/2018, held since 02/14/2020 due to mandible abscess. -Myeloma labs on 05/14/2020 showed progression with M spike of 0.5 g. -RVD started back on 05/21/2020. -Myeloma labs on 07/10/2020 shows M spike improved to 0.3 g from 0.6 g previously. Free light chain ratio is 1.74 with kappa light chains 28.4. -Myeloma panel from 08/14/2020 shows M spike 0.4 g. Kappa light chains are 24.8 and ratio is 2.23.  2. Osteomyelitis of the right mandible/dental abscess: -Finished IV ceftriaxone on 04/03/2020. Finished oral antibiotics. -We will hold Xgeva indefinitely.   PLAN:  1. IgA kappa plasma cell myeloma, stage I: -She is taking Velcade 3 weeks on 1 week off and gets dexamethasone 40 mg weekly on Velcade days. - She is also taking Revlimid at home.  She did not get her last shipment. - Reviewed labs from 04/29/2021.  M spike is stable at 0.3.  Free light chain ratio is also stable at 2.09 and kappa light chains 20.3.  Immunofixation was unremarkable. - We will continue current treatment regimen.  RTC 6 weeks for follow-up.  2. Severe hypokalemia: -She is taking potassium liquid 3 times a day. - Potassium today is 2.9.  She has not taken this morning dose.  She will be given potassium in our clinic.  3. Osteomyelitis of the right mandible/dental abscess: -She had bone exposure in the right lower jaw.  Bisphosphonates on hold indefinitely.  4. Hypomagnesemia: -Continue magnesium supplements 3 times a day.  5. 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 - Hemoglobin is  9.5 with MCV of 100.7.   Orders placed this encounter:  No orders of the defined types were placed in this encounter.    Derek Jack, MD [facility] [phone]   I, Thana Ates, am acting as a scribe for Dr. Derek Jack.  I, Derek Jack MD, have reviewed the above documentation for accuracy and completeness, and I agree with the above.

## 2021-05-13 ENCOUNTER — Inpatient Hospital Stay (HOSPITAL_COMMUNITY): Payer: Medicare Other

## 2021-05-13 ENCOUNTER — Other Ambulatory Visit: Payer: Self-pay

## 2021-05-13 ENCOUNTER — Inpatient Hospital Stay (HOSPITAL_BASED_OUTPATIENT_CLINIC_OR_DEPARTMENT_OTHER): Payer: Medicare Other | Admitting: Hematology

## 2021-05-13 VITALS — BP 130/54 | HR 67 | Temp 97.3°F | Resp 18 | Wt 165.1 lb

## 2021-05-13 DIAGNOSIS — C9000 Multiple myeloma not having achieved remission: Secondary | ICD-10-CM | POA: Diagnosis not present

## 2021-05-13 DIAGNOSIS — E538 Deficiency of other specified B group vitamins: Secondary | ICD-10-CM

## 2021-05-13 DIAGNOSIS — Z5112 Encounter for antineoplastic immunotherapy: Secondary | ICD-10-CM | POA: Diagnosis not present

## 2021-05-13 LAB — COMPREHENSIVE METABOLIC PANEL
ALT: 10 U/L (ref 0–44)
AST: 12 U/L — ABNORMAL LOW (ref 15–41)
Albumin: 3.5 g/dL (ref 3.5–5.0)
Alkaline Phosphatase: 61 U/L (ref 38–126)
Anion gap: 7 (ref 5–15)
BUN: 13 mg/dL (ref 8–23)
CO2: 23 mmol/L (ref 22–32)
Calcium: 9.2 mg/dL (ref 8.9–10.3)
Chloride: 111 mmol/L (ref 98–111)
Creatinine, Ser: 0.98 mg/dL (ref 0.44–1.00)
GFR, Estimated: 58 mL/min — ABNORMAL LOW (ref >60)
Glucose, Bld: 107 mg/dL — ABNORMAL HIGH (ref 70–99)
Potassium: 2.9 mmol/L — ABNORMAL LOW (ref 3.5–5.1)
Sodium: 141 mmol/L (ref 135–145)
Total Bilirubin: 0.9 mg/dL (ref 0.3–1.2)
Total Protein: 6.3 g/dL — ABNORMAL LOW (ref 6.5–8.1)

## 2021-05-13 LAB — CBC WITH DIFFERENTIAL/PLATELET
Abs Immature Granulocytes: 0.02 10*3/uL (ref 0.00–0.07)
Basophils Absolute: 0 10*3/uL (ref 0.0–0.1)
Basophils Relative: 1 %
Eosinophils Absolute: 0.2 10*3/uL (ref 0.0–0.5)
Eosinophils Relative: 5 %
HCT: 29 % — ABNORMAL LOW (ref 36.0–46.0)
Hemoglobin: 9.5 g/dL — ABNORMAL LOW (ref 12.0–15.0)
Immature Granulocytes: 1 %
Lymphocytes Relative: 23 %
Lymphs Abs: 0.9 10*3/uL (ref 0.7–4.0)
MCH: 33 pg (ref 26.0–34.0)
MCHC: 32.8 g/dL (ref 30.0–36.0)
MCV: 100.7 fL — ABNORMAL HIGH (ref 80.0–100.0)
Monocytes Absolute: 0.4 10*3/uL (ref 0.1–1.0)
Monocytes Relative: 11 %
Neutro Abs: 2.4 10*3/uL (ref 1.7–7.7)
Neutrophils Relative %: 59 %
Platelets: 161 10*3/uL (ref 150–400)
RBC: 2.88 MIL/uL — ABNORMAL LOW (ref 3.87–5.11)
RDW: 15.5 % (ref 11.5–15.5)
WBC: 4 10*3/uL (ref 4.0–10.5)
nRBC: 0 % (ref 0.0–0.2)

## 2021-05-13 MED ORDER — EPOETIN ALFA-EPBX 20000 UNIT/ML IJ SOLN
20000.0000 [IU] | Freq: Once | INTRAMUSCULAR | Status: AC
  Administered 2021-05-13: 20000 [IU] via SUBCUTANEOUS
  Filled 2021-05-13: qty 1

## 2021-05-13 MED ORDER — BORTEZOMIB CHEMO SQ INJECTION 3.5 MG (2.5MG/ML)
1.3000 mg/m2 | Freq: Once | INTRAMUSCULAR | Status: AC
  Administered 2021-05-13: 2.25 mg via SUBCUTANEOUS
  Filled 2021-05-13: qty 0.9

## 2021-05-13 MED ORDER — PROCHLORPERAZINE MALEATE 10 MG PO TABS
10.0000 mg | ORAL_TABLET | Freq: Once | ORAL | Status: AC
  Administered 2021-05-13: 10 mg via ORAL
  Filled 2021-05-13: qty 1

## 2021-05-13 MED ORDER — DEXAMETHASONE 4 MG PO TABS
40.0000 mg | ORAL_TABLET | Freq: Once | ORAL | Status: AC
  Administered 2021-05-13: 40 mg via ORAL
  Filled 2021-05-13: qty 10

## 2021-05-13 MED ORDER — POTASSIUM CHLORIDE CRYS ER 20 MEQ PO TBCR
40.0000 meq | EXTENDED_RELEASE_TABLET | Freq: Once | ORAL | Status: AC
  Administered 2021-05-13: 40 meq via ORAL
  Filled 2021-05-13: qty 2

## 2021-05-13 NOTE — Patient Instructions (Signed)
[facility]  Discharge Instructions: Thank you for choosing [facility] to provide your oncology and hematology care.  If you have a lab appointment with the [facility], please come in thru the Main Entrance and check in at the main information desk.  Wear comfortable clothing and clothing appropriate for easy access to any Portacath or PICC line.   We strive to give you quality time with your provider. You may need to reschedule your appointment if you arrive late (15 or more minutes).  Arriving late affects you and other patients whose appointments are after yours.  Also, if you miss three or more appointments without notifying the office, you may be dismissed from the clinic at the provider's discretion.      For prescription refill requests, have your pharmacy contact our office and allow 72 hours for refills to be completed.    Today you received the following chemotherapy and/or immunotherapy agents velcade, and retacrit today.  Hemoglobin 9.5.  Potassium 40 meq PO given today.  Potassium level 2.9.      To help prevent nausea and vomiting after your treatment, we encourage you to take your nausea medication as directed.  BELOW ARE SYMPTOMS THAT SHOULD BE REPORTED IMMEDIATELY: . *FEVER GREATER THAN 100.4 F (38 C) OR HIGHER . *CHILLS OR SWEATING . *NAUSEA AND VOMITING THAT IS NOT CONTROLLED WITH YOUR NAUSEA MEDICATION . *UNUSUAL SHORTNESS OF BREATH . *UNUSUAL BRUISING OR BLEEDING . *URINARY PROBLEMS (pain or burning when urinating, or frequent urination) . *BOWEL PROBLEMS (unusual diarrhea, constipation, pain near the anus) . TENDERNESS IN MOUTH AND THROAT WITH OR WITHOUT PRESENCE OF ULCERS (sore throat, sores in mouth, or a toothache) . UNUSUAL RASH, SWELLING OR PAIN  . UNUSUAL VAGINAL DISCHARGE OR ITCHING   Items with * indicate a potential emergency and should be followed up as soon as possible or go to the Emergency Department if any problems should  occur.  Please show the CHEMOTHERAPY ALERT CARD or IMMUNOTHERAPY ALERT CARD at check-in to the Emergency Department and triage nurse.  Should you have questions after your visit or need to cancel or reschedule your appointment, please contact [facility] [phone]  and follow the prompts.  Office hours are 8:00 a.m. to 4:30 p.m. Monday - Friday. Please note that voicemails left after 4:00 p.m. may not be returned until the following business day.  We are closed weekends and major holidays. You have access to a nurse at all times for urgent questions. Please call the main number to the clinic [phone] and follow the prompts.  For any non-urgent questions, you may also contact your provider using MyChart. We now offer e-Visits for anyone 35 and older to request care online for non-urgent symptoms. For details visit [email or web address].   Also download the MyChart app! Go to the app store, search "MyChart", open the app, select [facility], and log in with your MyChart username and password.  Due to Covid, a mask is required upon entering the hospital/clinic. If you do not have a mask, one will be given to you upon arrival. For doctor visits, patients may have 1 support person aged 14 or older with them. For treatment visits, patients cannot have anyone with them due to current Covid guidelines and our immunocompromised population.

## 2021-05-13 NOTE — Patient Instructions (Signed)
[place] at [facility] Discharge Instructions  You were seen today by Dr. Delton Coombes. He went over your recent results. You received treatment today and will continue to receive treatment every 2 weeks. Dr. Delton Coombes will see you back in 6 weeks for labs and follow up.   Thank you for choosing [place] at [facility] to provide your oncology and hematology care.  To afford each patient quality time with our provider, please arrive at least 15 minutes before your scheduled appointment time.   If you have a lab appointment with the [facility] please come in thru the Main Entrance and check in at the main information desk  You need to re-schedule your appointment should you arrive 10 or more minutes late.  We strive to give you quality time with our providers, and arriving late affects you and other patients whose appointments are after yours.  Also, if you no show three or more times for appointments you may be dismissed from the clinic at the providers discretion.     Again, thank you for choosing [facility].  Our hope is that these requests will decrease the amount of time that you wait before being seen by our physicians.       _____________________________________________________________  Should you have questions after your visit to [facility], please contact our office at [phone] between the hours of 8:00 a.m. and 4:30 p.m.  Voicemails left after 4:00 p.m. will not be returned until the following business day.  For prescription refill requests, have your pharmacy contact our office and allow 72 hours.    Cancer Center Support Programs:   > Cancer Support Group  2nd Tuesday of the month 1pm-2pm, Journey Room

## 2021-05-13 NOTE — Progress Notes (Signed)
Pt here for velcade and retacrit.  Hemoglobin 9.5.  Potassium 2.9.  K-dur 40 meq PO today.  Okay for treatment today per Dr Raliegh Ip.    Tolerated treatment well today without incidence today.  Stable during and after treatment.  AVS reviewed.  Discharged in stable condition ambulatory.

## 2021-05-13 NOTE — Progress Notes (Signed)
Patient assessed and labs reviewed by Dr Katragadda.  No acute distress noted.  Okay for treatment today.  

## 2021-05-20 ENCOUNTER — Inpatient Hospital Stay (HOSPITAL_COMMUNITY): Payer: Medicare Other

## 2021-05-20 ENCOUNTER — Encounter (HOSPITAL_COMMUNITY): Payer: Self-pay

## 2021-05-20 ENCOUNTER — Other Ambulatory Visit: Payer: Self-pay

## 2021-05-20 VITALS — BP 135/55 | HR 60 | Temp 96.4°F | Resp 18 | Wt 158.0 lb

## 2021-05-20 DIAGNOSIS — E876 Hypokalemia: Secondary | ICD-10-CM

## 2021-05-20 DIAGNOSIS — C9000 Multiple myeloma not having achieved remission: Secondary | ICD-10-CM

## 2021-05-20 DIAGNOSIS — Z5112 Encounter for antineoplastic immunotherapy: Secondary | ICD-10-CM | POA: Diagnosis not present

## 2021-05-20 LAB — CBC WITH DIFFERENTIAL/PLATELET
Abs Immature Granulocytes: 0.02 10*3/uL (ref 0.00–0.07)
Basophils Absolute: 0 10*3/uL (ref 0.0–0.1)
Basophils Relative: 0 %
Eosinophils Absolute: 0.1 10*3/uL (ref 0.0–0.5)
Eosinophils Relative: 5 %
HCT: 32.5 % — ABNORMAL LOW (ref 36.0–46.0)
Hemoglobin: 10.5 g/dL — ABNORMAL LOW (ref 12.0–15.0)
Immature Granulocytes: 1 %
Lymphocytes Relative: 25 %
Lymphs Abs: 0.8 10*3/uL (ref 0.7–4.0)
MCH: 32.7 pg (ref 26.0–34.0)
MCHC: 32.3 g/dL (ref 30.0–36.0)
MCV: 101.2 fL — ABNORMAL HIGH (ref 80.0–100.0)
Monocytes Absolute: 0.3 10*3/uL (ref 0.1–1.0)
Monocytes Relative: 10 %
Neutro Abs: 1.9 10*3/uL (ref 1.7–7.7)
Neutrophils Relative %: 59 %
Platelets: 172 10*3/uL (ref 150–400)
RBC: 3.21 MIL/uL — ABNORMAL LOW (ref 3.87–5.11)
RDW: 16.3 % — ABNORMAL HIGH (ref 11.5–15.5)
WBC: 3.1 10*3/uL — ABNORMAL LOW (ref 4.0–10.5)
nRBC: 0 % (ref 0.0–0.2)

## 2021-05-20 LAB — COMPREHENSIVE METABOLIC PANEL
ALT: 9 U/L (ref 0–44)
AST: 11 U/L — ABNORMAL LOW (ref 15–41)
Albumin: 3.7 g/dL (ref 3.5–5.0)
Alkaline Phosphatase: 72 U/L (ref 38–126)
Anion gap: 8 (ref 5–15)
BUN: 14 mg/dL (ref 8–23)
CO2: 27 mmol/L (ref 22–32)
Calcium: 9.5 mg/dL (ref 8.9–10.3)
Chloride: 105 mmol/L (ref 98–111)
Creatinine, Ser: 1.06 mg/dL — ABNORMAL HIGH (ref 0.44–1.00)
GFR, Estimated: 53 mL/min — ABNORMAL LOW (ref >60)
Glucose, Bld: 112 mg/dL — ABNORMAL HIGH (ref 70–99)
Potassium: 2.6 mmol/L — CL (ref 3.5–5.1)
Sodium: 140 mmol/L (ref 135–145)
Total Bilirubin: 1.7 mg/dL — ABNORMAL HIGH (ref 0.3–1.2)
Total Protein: 6.6 g/dL (ref 6.5–8.1)

## 2021-05-20 MED ORDER — DEXAMETHASONE 4 MG PO TABS
40.0000 mg | ORAL_TABLET | Freq: Once | ORAL | Status: AC
  Administered 2021-05-20: 40 mg via ORAL
  Filled 2021-05-20: qty 10

## 2021-05-20 MED ORDER — POTASSIUM CHLORIDE CRYS ER 20 MEQ PO TBCR
40.0000 meq | EXTENDED_RELEASE_TABLET | Freq: Once | ORAL | Status: AC
  Administered 2021-05-20: 40 meq via ORAL
  Filled 2021-05-20: qty 2

## 2021-05-20 MED ORDER — POTASSIUM CHLORIDE 10 MEQ/100ML IV SOLN
10.0000 meq | INTRAVENOUS | Status: AC
  Administered 2021-05-20 (×2): 10 meq via INTRAVENOUS
  Filled 2021-05-20 (×2): qty 100

## 2021-05-20 MED ORDER — BORTEZOMIB CHEMO SQ INJECTION 3.5 MG (2.5MG/ML)
1.0000 mg/m2 | Freq: Once | INTRAMUSCULAR | Status: AC
  Administered 2021-05-20: 1.75 mg via SUBCUTANEOUS
  Filled 2021-05-20: qty 0.7

## 2021-05-20 MED ORDER — PROCHLORPERAZINE MALEATE 10 MG PO TABS
10.0000 mg | ORAL_TABLET | Freq: Once | ORAL | Status: AC
  Administered 2021-05-20: 10 mg via ORAL
  Filled 2021-05-20: qty 1

## 2021-05-20 MED ORDER — SODIUM CHLORIDE 0.9 % IV SOLN
Freq: Once | INTRAVENOUS | Status: AC
Start: 2021-05-20 — End: 2021-05-20

## 2021-05-20 NOTE — Patient Instructions (Signed)
[place]  Discharge Instructions: Thank you for choosing [place] [place] to provide your oncology and hematology care.  If you have a lab appointment with the [facility], please come in thru the Main Entrance and check in at the main information desk.  Wear comfortable clothing and clothing appropriate for easy access to any Portacath or PICC line.   We strive to give you quality time with your provider. You may need to reschedule your appointment if you arrive late (15 or more minutes).  Arriving late affects you and other patients whose appointments are after yours.  Also, if you miss three or more appointments without notifying the office, you may be dismissed from the clinic at the provider's discretion.      For prescription refill requests, have your pharmacy contact our office and allow 72 hours for refills to be completed.    Today you received the following chemotherapy and/or immunotherapy agents Velcade and potassium.   To help prevent nausea and vomiting after your treatment, we encourage you to take your nausea medication as directed.  BELOW ARE SYMPTOMS THAT SHOULD BE REPORTED IMMEDIATELY: . *FEVER GREATER THAN 100.4 F (38 C) OR HIGHER . *CHILLS OR SWEATING . *NAUSEA AND VOMITING THAT IS NOT CONTROLLED WITH YOUR NAUSEA MEDICATION . *UNUSUAL SHORTNESS OF BREATH . *UNUSUAL BRUISING OR BLEEDING . *URINARY PROBLEMS (pain or burning when urinating, or frequent urination) . *BOWEL PROBLEMS (unusual diarrhea, constipation, pain near the anus) . TENDERNESS IN MOUTH AND THROAT WITH OR WITHOUT PRESENCE OF ULCERS (sore throat, sores in mouth, or a toothache) . UNUSUAL RASH, SWELLING OR PAIN  . UNUSUAL VAGINAL DISCHARGE OR ITCHING   Items with * indicate a potential emergency and should be followed up as soon as possible or go to the Emergency Department if any problems should occur.  Please show the CHEMOTHERAPY ALERT CARD or IMMUNOTHERAPY ALERT CARD at  check-in to the Emergency Department and triage nurse.  Should you have questions after your visit or need to cancel or reschedule your appointment, please contact [facility] [phone]  and follow the prompts.  Office hours are 8:00 a.m. to 4:30 p.m. Monday - Friday. Please note that voicemails left after 4:00 p.m. may not be returned until the following business day.  We are closed weekends and major holidays. You have access to a nurse at all times for urgent questions. Please call the main number to the clinic [phone] and follow the prompts.  For any non-urgent questions, you may also contact your provider using MyChart. We now offer e-Visits for anyone 22 and older to request care online for non-urgent symptoms. For details visit [email or web address].   Also download the MyChart app! Go to the app store, search "MyChart", open the app, select , and log in with your MyChart username and password.  Due to Covid, a mask is required upon entering the hospital/clinic. If you do not have a mask, one will be given to you upon arrival. For doctor visits, patients may have 1 support person aged 64 or older with them. For treatment visits, patients cannot have anyone with them due to current Covid guidelines and our immunocompromised population.

## 2021-05-20 NOTE — Progress Notes (Signed)
Total Bili 1.7 today and Dr. Delton Coombes notified.  Velcade reduced.  See MAR for details.

## 2021-05-20 NOTE — Progress Notes (Signed)
Ok to proceed with tbili 1.7 - received order to decrease today's dose of Velcade to 1 mg/m2.  Order updated to reflect the change.  T.O. Dr Rhys Martini, PharmD 05/20/21 @ 1140

## 2021-05-20 NOTE — Progress Notes (Signed)
Critical Value Alert:  05/20/2021 at 1035: Critical K 2.6  Dr. Delton Coombes made aware. Awaiting further instructions

## 2021-05-20 NOTE — Progress Notes (Signed)
Potassium 2.6, received verbal orders from Dr. Delton Coombes for 80 mEq of PO potassium and 20 mEq potassium IV. Patient tolerated infusion with no complaints voiced. Peripheral IV site clean and dry with good blood return noted before and after infusion. Band aid applied.   Patient tolerated Velcade injection with no complaints voiced. Lab work reviewed. See MAR for details. Injection site clean and dry with no bruising or swelling noted. Patient stable during and after injection. Band aid applied. VSS. Patient left in satisfactory condition with no s/s of distress noted.

## 2021-05-27 ENCOUNTER — Other Ambulatory Visit: Payer: Self-pay

## 2021-05-27 ENCOUNTER — Inpatient Hospital Stay (HOSPITAL_COMMUNITY): Payer: Medicare Other

## 2021-05-27 ENCOUNTER — Encounter (HOSPITAL_COMMUNITY): Payer: Self-pay

## 2021-05-27 VITALS — BP 125/45 | HR 64 | Temp 96.9°F | Resp 18 | Wt 163.0 lb

## 2021-05-27 DIAGNOSIS — C9000 Multiple myeloma not having achieved remission: Secondary | ICD-10-CM

## 2021-05-27 DIAGNOSIS — E538 Deficiency of other specified B group vitamins: Secondary | ICD-10-CM

## 2021-05-27 DIAGNOSIS — Z5112 Encounter for antineoplastic immunotherapy: Secondary | ICD-10-CM | POA: Diagnosis not present

## 2021-05-27 LAB — CBC WITH DIFFERENTIAL/PLATELET
Abs Immature Granulocytes: 0.03 10*3/uL (ref 0.00–0.07)
Basophils Absolute: 0 10*3/uL (ref 0.0–0.1)
Basophils Relative: 0 %
Eosinophils Absolute: 0.1 10*3/uL (ref 0.0–0.5)
Eosinophils Relative: 2 %
HCT: 29.4 % — ABNORMAL LOW (ref 36.0–46.0)
Hemoglobin: 9.5 g/dL — ABNORMAL LOW (ref 12.0–15.0)
Immature Granulocytes: 1 %
Lymphocytes Relative: 21 %
Lymphs Abs: 1 10*3/uL (ref 0.7–4.0)
MCH: 33 pg (ref 26.0–34.0)
MCHC: 32.3 g/dL (ref 30.0–36.0)
MCV: 102.1 fL — ABNORMAL HIGH (ref 80.0–100.0)
Monocytes Absolute: 0.6 10*3/uL (ref 0.1–1.0)
Monocytes Relative: 13 %
Neutro Abs: 3 10*3/uL (ref 1.7–7.7)
Neutrophils Relative %: 63 %
Platelets: 138 10*3/uL — ABNORMAL LOW (ref 150–400)
RBC: 2.88 MIL/uL — ABNORMAL LOW (ref 3.87–5.11)
RDW: 16.3 % — ABNORMAL HIGH (ref 11.5–15.5)
WBC: 4.8 10*3/uL (ref 4.0–10.5)
nRBC: 0 % (ref 0.0–0.2)

## 2021-05-27 LAB — COMPREHENSIVE METABOLIC PANEL
ALT: 9 U/L (ref 0–44)
AST: 11 U/L — ABNORMAL LOW (ref 15–41)
Albumin: 3.4 g/dL — ABNORMAL LOW (ref 3.5–5.0)
Alkaline Phosphatase: 64 U/L (ref 38–126)
Anion gap: 8 (ref 5–15)
BUN: 17 mg/dL (ref 8–23)
CO2: 21 mmol/L — ABNORMAL LOW (ref 22–32)
Calcium: 9.1 mg/dL (ref 8.9–10.3)
Chloride: 108 mmol/L (ref 98–111)
Creatinine, Ser: 1.15 mg/dL — ABNORMAL HIGH (ref 0.44–1.00)
GFR, Estimated: 48 mL/min — ABNORMAL LOW (ref >60)
Glucose, Bld: 183 mg/dL — ABNORMAL HIGH (ref 70–99)
Potassium: 3.6 mmol/L (ref 3.5–5.1)
Sodium: 137 mmol/L (ref 135–145)
Total Bilirubin: 1.1 mg/dL (ref 0.3–1.2)
Total Protein: 6 g/dL — ABNORMAL LOW (ref 6.5–8.1)

## 2021-05-27 MED ORDER — EPOETIN ALFA-EPBX 20000 UNIT/ML IJ SOLN
INTRAMUSCULAR | Status: AC
  Filled 2021-05-27: qty 1

## 2021-05-27 MED ORDER — CYANOCOBALAMIN 1000 MCG/ML IJ SOLN
1000.0000 ug | Freq: Once | INTRAMUSCULAR | Status: AC
Start: 2021-05-27 — End: 2021-05-27
  Administered 2021-05-27: 1000 ug via INTRAMUSCULAR

## 2021-05-27 MED ORDER — PROCHLORPERAZINE MALEATE 10 MG PO TABS
ORAL_TABLET | ORAL | Status: AC
  Filled 2021-05-27: qty 1

## 2021-05-27 MED ORDER — PROCHLORPERAZINE MALEATE 10 MG PO TABS
10.0000 mg | ORAL_TABLET | Freq: Once | ORAL | Status: AC
  Administered 2021-05-27: 10 mg via ORAL

## 2021-05-27 MED ORDER — BORTEZOMIB CHEMO SQ INJECTION 3.5 MG (2.5MG/ML)
1.3000 mg/m2 | Freq: Once | INTRAMUSCULAR | Status: AC
  Administered 2021-05-27: 2.25 mg via SUBCUTANEOUS
  Filled 2021-05-27: qty 0.9

## 2021-05-27 MED ORDER — EPOETIN ALFA-EPBX 20000 UNIT/ML IJ SOLN
20000.0000 [IU] | Freq: Once | INTRAMUSCULAR | Status: AC
  Administered 2021-05-27: 20000 [IU] via SUBCUTANEOUS

## 2021-05-27 MED ORDER — DEXAMETHASONE 4 MG PO TABS
40.0000 mg | ORAL_TABLET | Freq: Once | ORAL | Status: AC
  Administered 2021-05-27: 40 mg via ORAL

## 2021-05-27 MED ORDER — DEXAMETHASONE 4 MG PO TABS
ORAL_TABLET | ORAL | Status: AC
  Filled 2021-05-27: qty 10

## 2021-05-27 MED ORDER — CYANOCOBALAMIN 1000 MCG/ML IJ SOLN
INTRAMUSCULAR | Status: AC
  Filled 2021-05-27: qty 1

## 2021-05-27 NOTE — Patient Instructions (Signed)
[place]  Discharge Instructions: Thank you for choosing [facility] to provide your oncology and hematology care.  If you have a lab appointment with the [facility], please come in thru the Main Entrance and check in at the main information desk.  Wear comfortable clothing and clothing appropriate for easy access to any Portacath or PICC line.   We strive to give you quality time with your provider. You may need to reschedule your appointment if you arrive late (15 or more minutes).  Arriving late affects you and other patients whose appointments are after yours.  Also, if you miss three or more appointments without notifying the office, you may be dismissed from the clinic at the provider's discretion.      For prescription refill requests, have your pharmacy contact our office and allow 72 hours for refills to be completed.    Today you received the following chemotherapy and/or immunotherapy agents:  Velcade      To help prevent nausea and vomiting after your treatment, we encourage you to take your nausea medication as directed.  BELOW ARE SYMPTOMS THAT SHOULD BE REPORTED IMMEDIATELY: . *FEVER GREATER THAN 100.4 F (38 C) OR HIGHER . *CHILLS OR SWEATING . *NAUSEA AND VOMITING THAT IS NOT CONTROLLED WITH YOUR NAUSEA MEDICATION . *UNUSUAL SHORTNESS OF BREATH . *UNUSUAL BRUISING OR BLEEDING . *URINARY PROBLEMS (pain or burning when urinating, or frequent urination) . *BOWEL PROBLEMS (unusual diarrhea, constipation, pain near the anus) . TENDERNESS IN MOUTH AND THROAT WITH OR WITHOUT PRESENCE OF ULCERS (sore throat, sores in mouth, or a toothache) . UNUSUAL RASH, SWELLING OR PAIN  . UNUSUAL VAGINAL DISCHARGE OR ITCHING   Items with * indicate a potential emergency and should be followed up as soon as possible or go to the Emergency Department if any problems should occur.  Please show the CHEMOTHERAPY ALERT CARD or IMMUNOTHERAPY ALERT CARD at check-in to  the Emergency Department and triage nurse.  Should you have questions after your visit or need to cancel or reschedule your appointment, please contact [facility] [phone]  and follow the prompts.  Office hours are 8:00 a.m. to 4:30 p.m. Monday - Friday. Please note that voicemails left after 4:00 p.m. may not be returned until the following business day.  We are closed weekends and major holidays. You have access to a nurse at all times for urgent questions. Please call the main number to the clinic [phone] and follow the prompts.  For any non-urgent questions, you may also contact your provider using MyChart. We now offer e-Visits for anyone 46 and older to request care online for non-urgent symptoms. For details visit [email or web address].   Also download the MyChart app! Go to the app store, search "MyChart", open the app, select [place], and log in with your MyChart username and password.  Due to Covid, a mask is required upon entering the hospital/clinic. If you do not have a mask, one will be given to you upon arrival. For doctor visits, patients may have 1 support person aged 63 or older with them. For treatment visits, patients cannot have anyone with them due to current Covid guidelines and our immunocompromised population.

## 2021-05-27 NOTE — Progress Notes (Signed)
Brittany Archer presents today for injection per the provider's orders.  Velcade/Retacrit/B12 administrations without incident; injection site WNL; see MAR for injection details.  Patient tolerated procedure well and without incident.  No questions or complaints noted at this time.  Discharged ambulatory in stable condition.

## 2021-06-02 ENCOUNTER — Other Ambulatory Visit (HOSPITAL_COMMUNITY): Payer: Self-pay

## 2021-06-02 DIAGNOSIS — C9000 Multiple myeloma not having achieved remission: Secondary | ICD-10-CM

## 2021-06-02 MED ORDER — LENALIDOMIDE 20 MG PO CAPS: ORAL_CAPSULE | ORAL | 0 refills | Status: DC

## 2021-06-02 NOTE — Telephone Encounter (Signed)
Chart reviewed. Revlimid refilled per Dr. Delton Coombes

## 2021-06-10 ENCOUNTER — Other Ambulatory Visit: Payer: Self-pay

## 2021-06-10 ENCOUNTER — Inpatient Hospital Stay (HOSPITAL_COMMUNITY): Payer: Medicare Other

## 2021-06-10 ENCOUNTER — Encounter (HOSPITAL_COMMUNITY): Payer: Self-pay

## 2021-06-10 ENCOUNTER — Inpatient Hospital Stay (HOSPITAL_COMMUNITY): Payer: Medicare Other | Attending: Hematology

## 2021-06-10 VITALS — BP 129/49 | HR 53 | Temp 96.9°F | Resp 18 | Wt 166.8 lb

## 2021-06-10 DIAGNOSIS — E1122 Type 2 diabetes mellitus with diabetic chronic kidney disease: Secondary | ICD-10-CM | POA: Diagnosis not present

## 2021-06-10 DIAGNOSIS — C9000 Multiple myeloma not having achieved remission: Secondary | ICD-10-CM

## 2021-06-10 DIAGNOSIS — E538 Deficiency of other specified B group vitamins: Secondary | ICD-10-CM

## 2021-06-10 DIAGNOSIS — E876 Hypokalemia: Secondary | ICD-10-CM | POA: Diagnosis not present

## 2021-06-10 DIAGNOSIS — Z5112 Encounter for antineoplastic immunotherapy: Secondary | ICD-10-CM | POA: Diagnosis not present

## 2021-06-10 DIAGNOSIS — M272 Inflammatory conditions of jaws: Secondary | ICD-10-CM | POA: Insufficient documentation

## 2021-06-10 DIAGNOSIS — I2510 Atherosclerotic heart disease of native coronary artery without angina pectoris: Secondary | ICD-10-CM | POA: Insufficient documentation

## 2021-06-10 DIAGNOSIS — K047 Periapical abscess without sinus: Secondary | ICD-10-CM | POA: Insufficient documentation

## 2021-06-10 DIAGNOSIS — N189 Chronic kidney disease, unspecified: Secondary | ICD-10-CM | POA: Insufficient documentation

## 2021-06-10 DIAGNOSIS — D631 Anemia in chronic kidney disease: Secondary | ICD-10-CM | POA: Diagnosis not present

## 2021-06-10 LAB — CBC WITH DIFFERENTIAL/PLATELET
Abs Immature Granulocytes: 0.01 10*3/uL (ref 0.00–0.07)
Basophils Absolute: 0 10*3/uL (ref 0.0–0.1)
Basophils Relative: 1 %
Eosinophils Absolute: 0.1 10*3/uL (ref 0.0–0.5)
Eosinophils Relative: 3 %
HCT: 30 % — ABNORMAL LOW (ref 36.0–46.0)
Hemoglobin: 9.7 g/dL — ABNORMAL LOW (ref 12.0–15.0)
Immature Granulocytes: 0 %
Lymphocytes Relative: 26 %
Lymphs Abs: 0.8 10*3/uL (ref 0.7–4.0)
MCH: 33.9 pg (ref 26.0–34.0)
MCHC: 32.3 g/dL (ref 30.0–36.0)
MCV: 104.9 fL — ABNORMAL HIGH (ref 80.0–100.0)
Monocytes Absolute: 0.4 10*3/uL (ref 0.1–1.0)
Monocytes Relative: 12 %
Neutro Abs: 1.8 10*3/uL (ref 1.7–7.7)
Neutrophils Relative %: 58 %
Platelets: 186 10*3/uL (ref 150–400)
RBC: 2.86 MIL/uL — ABNORMAL LOW (ref 3.87–5.11)
RDW: 16.6 % — ABNORMAL HIGH (ref 11.5–15.5)
WBC: 3.2 10*3/uL — ABNORMAL LOW (ref 4.0–10.5)
nRBC: 0 % (ref 0.0–0.2)

## 2021-06-10 LAB — COMPREHENSIVE METABOLIC PANEL
ALT: 7 U/L (ref 0–44)
AST: 12 U/L — ABNORMAL LOW (ref 15–41)
Albumin: 3.4 g/dL — ABNORMAL LOW (ref 3.5–5.0)
Alkaline Phosphatase: 61 U/L (ref 38–126)
Anion gap: 7 (ref 5–15)
BUN: 17 mg/dL (ref 8–23)
CO2: 23 mmol/L (ref 22–32)
Calcium: 9 mg/dL (ref 8.9–10.3)
Chloride: 110 mmol/L (ref 98–111)
Creatinine, Ser: 1.06 mg/dL — ABNORMAL HIGH (ref 0.44–1.00)
GFR, Estimated: 53 mL/min — ABNORMAL LOW (ref >60)
Glucose, Bld: 149 mg/dL — ABNORMAL HIGH (ref 70–99)
Potassium: 3.3 mmol/L — ABNORMAL LOW (ref 3.5–5.1)
Sodium: 140 mmol/L (ref 135–145)
Total Bilirubin: 1.1 mg/dL (ref 0.3–1.2)
Total Protein: 5.9 g/dL — ABNORMAL LOW (ref 6.5–8.1)

## 2021-06-10 MED ORDER — DEXAMETHASONE 4 MG PO TABS
40.0000 mg | ORAL_TABLET | Freq: Once | ORAL | Status: AC
Start: 2021-06-10 — End: 2021-06-10
  Administered 2021-06-10: 40 mg via ORAL
  Filled 2021-06-10: qty 10

## 2021-06-10 MED ORDER — BORTEZOMIB CHEMO SQ INJECTION 3.5 MG (2.5MG/ML)
1.3000 mg/m2 | Freq: Once | INTRAMUSCULAR | Status: AC
  Administered 2021-06-10: 2.25 mg via SUBCUTANEOUS
  Filled 2021-06-10: qty 0.9

## 2021-06-10 MED ORDER — EPOETIN ALFA-EPBX 20000 UNIT/ML IJ SOLN
20000.0000 [IU] | Freq: Once | INTRAMUSCULAR | Status: AC
  Administered 2021-06-10: 20000 [IU] via SUBCUTANEOUS
  Filled 2021-06-10: qty 1

## 2021-06-10 MED ORDER — PROCHLORPERAZINE MALEATE 10 MG PO TABS
10.0000 mg | ORAL_TABLET | Freq: Once | ORAL | Status: AC
  Administered 2021-06-10: 10 mg via ORAL
  Filled 2021-06-10: qty 1

## 2021-06-10 NOTE — Patient Instructions (Signed)
[place]  Discharge Instructions: Thank you for choosing [facility] to provide your oncology and hematology care.  If you have a lab appointment with the [facility], please come in thru the Main Entrance and check in at the main information desk.  Wear comfortable clothing and clothing appropriate for easy access to any Portacath or PICC line.   We strive to give you quality time with your provider. You may need to reschedule your appointment if you arrive late (15 or more minutes).  Arriving late affects you and other patients whose appointments are after yours.  Also, if you miss three or more appointments without notifying the office, you may be dismissed from the clinic at the provider's discretion.      For prescription refill requests, have your pharmacy contact our office and allow 72 hours for refills to be completed.    Today you received the following chemotherapy and/or immunotherapy agents; Velcade and Retacrit.      To help prevent nausea and vomiting after your treatment, we encourage you to take your nausea medication as directed.  BELOW ARE SYMPTOMS THAT SHOULD BE REPORTED IMMEDIATELY: *FEVER GREATER THAN 100.4 F (38 C) OR HIGHER *CHILLS OR SWEATING *NAUSEA AND VOMITING THAT IS NOT CONTROLLED WITH YOUR NAUSEA MEDICATION *UNUSUAL SHORTNESS OF BREATH *UNUSUAL BRUISING OR BLEEDING *URINARY PROBLEMS (pain or burning when urinating, or frequent urination) *BOWEL PROBLEMS (unusual diarrhea, constipation, pain near the anus) TENDERNESS IN MOUTH AND THROAT WITH OR WITHOUT PRESENCE OF ULCERS (sore throat, sores in mouth, or a toothache) UNUSUAL RASH, SWELLING OR PAIN  UNUSUAL VAGINAL DISCHARGE OR ITCHING   Items with * indicate a potential emergency and should be followed up as soon as possible or go to the Emergency Department if any problems should occur.  Please show the CHEMOTHERAPY ALERT CARD or IMMUNOTHERAPY ALERT CARD at check-in to the  Emergency Department and triage nurse.  Should you have questions after your visit or need to cancel or reschedule your appointment, please contact [facility] [phone]  and follow the prompts.  Office hours are 8:00 a.m. to 4:30 p.m. Monday - Friday. Please note that voicemails left after 4:00 p.m. may not be returned until the following business day.  We are closed weekends and major holidays. You have access to a nurse at all times for urgent questions. Please call the main number to the clinic [phone] and follow the prompts.  For any non-urgent questions, you may also contact your provider using MyChart. We now offer e-Visits for anyone 73 and older to request care online for non-urgent symptoms. For details visit [email or web address].   Also download the MyChart app! Go to the app store, search "MyChart", open the app, select [place], and log in with your MyChart username and password.  Due to Covid, a mask is required upon entering the hospital/clinic. If you do not have a mask, one will be given to you upon arrival. For doctor visits, patients may have 1 support person aged 66 or older with them. For treatment visits, patients cannot have anyone with them due to current Covid guidelines and our immunocompromised population.

## 2021-06-10 NOTE — Progress Notes (Signed)
Potassium 3.3, Dr. Delton Coombes made aware, no additional orders.   Patient tolerated Velcade and Retacrit injection with no complaints voiced. Site clean and dry with no bruising or swelling noted at site. See MAR for details. Band aid applied.  Patient stable during and after injection. VSS with discharge and left in satisfactory condition with no s/s of distress noted.

## 2021-06-17 ENCOUNTER — Inpatient Hospital Stay (HOSPITAL_COMMUNITY): Payer: Medicare Other

## 2021-06-19 ENCOUNTER — Inpatient Hospital Stay (HOSPITAL_COMMUNITY): Payer: Medicare Other

## 2021-06-19 ENCOUNTER — Encounter (HOSPITAL_COMMUNITY): Payer: Self-pay

## 2021-06-19 ENCOUNTER — Other Ambulatory Visit: Payer: Self-pay

## 2021-06-19 DIAGNOSIS — Z5112 Encounter for antineoplastic immunotherapy: Secondary | ICD-10-CM | POA: Diagnosis not present

## 2021-06-19 DIAGNOSIS — C9000 Multiple myeloma not having achieved remission: Secondary | ICD-10-CM

## 2021-06-19 LAB — IRON AND TIBC
Iron: 44 ug/dL (ref 28–170)
Saturation Ratios: 28 % (ref 10.4–31.8)
TIBC: 156 ug/dL — ABNORMAL LOW (ref 250–450)
UIBC: 112 ug/dL

## 2021-06-19 LAB — CBC WITH DIFFERENTIAL/PLATELET
Abs Immature Granulocytes: 0.01 10*3/uL (ref 0.00–0.07)
Basophils Absolute: 0 10*3/uL (ref 0.0–0.1)
Basophils Relative: 1 %
Eosinophils Absolute: 0.2 10*3/uL (ref 0.0–0.5)
Eosinophils Relative: 5 %
HCT: 31.4 % — ABNORMAL LOW (ref 36.0–46.0)
Hemoglobin: 10.2 g/dL — ABNORMAL LOW (ref 12.0–15.0)
Immature Granulocytes: 0 %
Lymphocytes Relative: 19 %
Lymphs Abs: 0.7 10*3/uL (ref 0.7–4.0)
MCH: 33.9 pg (ref 26.0–34.0)
MCHC: 32.5 g/dL (ref 30.0–36.0)
MCV: 104.3 fL — ABNORMAL HIGH (ref 80.0–100.0)
Monocytes Absolute: 0.3 10*3/uL (ref 0.1–1.0)
Monocytes Relative: 9 %
Neutro Abs: 2.4 10*3/uL (ref 1.7–7.7)
Neutrophils Relative %: 66 %
Platelets: 156 10*3/uL (ref 150–400)
RBC: 3.01 MIL/uL — ABNORMAL LOW (ref 3.87–5.11)
RDW: 16.7 % — ABNORMAL HIGH (ref 11.5–15.5)
WBC: 3.6 10*3/uL — ABNORMAL LOW (ref 4.0–10.5)
nRBC: 0 % (ref 0.0–0.2)

## 2021-06-19 LAB — COMPREHENSIVE METABOLIC PANEL
ALT: 10 U/L (ref 0–44)
AST: 12 U/L — ABNORMAL LOW (ref 15–41)
Albumin: 3.5 g/dL (ref 3.5–5.0)
Alkaline Phosphatase: 68 U/L (ref 38–126)
Anion gap: 10 (ref 5–15)
BUN: 14 mg/dL (ref 8–23)
CO2: 26 mmol/L (ref 22–32)
Calcium: 9.4 mg/dL (ref 8.9–10.3)
Chloride: 105 mmol/L (ref 98–111)
Creatinine, Ser: 0.98 mg/dL (ref 0.44–1.00)
GFR, Estimated: 58 mL/min — ABNORMAL LOW (ref >60)
Glucose, Bld: 88 mg/dL (ref 70–99)
Potassium: 3.1 mmol/L — ABNORMAL LOW (ref 3.5–5.1)
Sodium: 141 mmol/L (ref 135–145)
Total Bilirubin: 1.2 mg/dL (ref 0.3–1.2)
Total Protein: 6.1 g/dL — ABNORMAL LOW (ref 6.5–8.1)

## 2021-06-19 LAB — LACTATE DEHYDROGENASE: LDH: 143 U/L (ref 98–192)

## 2021-06-19 LAB — MAGNESIUM: Magnesium: 1.2 mg/dL — ABNORMAL LOW (ref 1.7–2.4)

## 2021-06-19 LAB — FERRITIN: Ferritin: 68 ng/mL (ref 11–307)

## 2021-06-19 MED ORDER — DEXAMETHASONE 4 MG PO TABS
40.0000 mg | ORAL_TABLET | Freq: Once | ORAL | Status: AC
  Administered 2021-06-19: 40 mg via ORAL
  Filled 2021-06-19: qty 10

## 2021-06-19 MED ORDER — BORTEZOMIB CHEMO SQ INJECTION 3.5 MG (2.5MG/ML)
1.3000 mg/m2 | Freq: Once | INTRAMUSCULAR | Status: AC
  Administered 2021-06-19: 2.25 mg via SUBCUTANEOUS
  Filled 2021-06-19: qty 0.9

## 2021-06-19 MED ORDER — MAGNESIUM SULFATE 4 GM/100ML IV SOLN: 4.0000 g | Freq: Once | INTRAVENOUS | Status: DC

## 2021-06-19 MED ORDER — MAGNESIUM SULFATE 2 GM/50ML IV SOLN
2.0000 g | INTRAVENOUS | Status: AC
  Administered 2021-06-19 (×2): 2 g via INTRAVENOUS
  Filled 2021-06-19: qty 50

## 2021-06-19 MED ORDER — PROCHLORPERAZINE MALEATE 10 MG PO TABS
10.0000 mg | ORAL_TABLET | Freq: Once | ORAL | Status: AC
  Administered 2021-06-19: 10 mg via ORAL
  Filled 2021-06-19: qty 1

## 2021-06-19 MED ORDER — POTASSIUM CHLORIDE CRYS ER 20 MEQ PO TBCR
40.0000 meq | EXTENDED_RELEASE_TABLET | Freq: Once | ORAL | Status: AC
  Administered 2021-06-19: 40 meq via ORAL
  Filled 2021-06-19: qty 2

## 2021-06-19 NOTE — Progress Notes (Signed)
Brittany Archer presents today for injection per the provider's orders.  Velcade administration without incident; injection site WNL; see MAR for injection details.  Patient tolerated procedure well and without incident, and remained stable throughout procedure as well as magnesium infusions.  No questions or complaints noted at this time. Discharged ambulatory in stable condition.

## 2021-06-19 NOTE — Patient Instructions (Signed)
[place]  Discharge Instructions: Thank you for choosing [facility] [place] to provide your oncology and hematology care.  If you have a lab appointment with the [facility], please come in thru the Main Entrance and check in at the main information desk.  Wear comfortable clothing and clothing appropriate for easy access to any Portacath or PICC line.   We strive to give you quality time with your provider. You may need to reschedule your appointment if you arrive late (15 or more minutes).  Arriving late affects you and other patients whose appointments are after yours.  Also, if you miss three or more appointments without notifying the office, you may be dismissed from the clinic at the provider's discretion.      For prescription refill requests, have your pharmacy contact our office and allow 72 hours for refills to be completed.    Today you received the following chemotherapy and/or immunotherapy agents:  Velcade.  You also received intravenous magnesium for low magnesium.       To help prevent nausea and vomiting after your treatment, we encourage you to take your nausea medication as directed.  BELOW ARE SYMPTOMS THAT SHOULD BE REPORTED IMMEDIATELY: *FEVER GREATER THAN 100.4 F (38 C) OR HIGHER *CHILLS OR SWEATING *NAUSEA AND VOMITING THAT IS NOT CONTROLLED WITH YOUR NAUSEA MEDICATION *UNUSUAL SHORTNESS OF BREATH *UNUSUAL BRUISING OR BLEEDING *URINARY PROBLEMS (pain or burning when urinating, or frequent urination) *BOWEL PROBLEMS (unusual diarrhea, constipation, pain near the anus) TENDERNESS IN MOUTH AND THROAT WITH OR WITHOUT PRESENCE OF ULCERS (sore throat, sores in mouth, or a toothache) UNUSUAL RASH, SWELLING OR PAIN  UNUSUAL VAGINAL DISCHARGE OR ITCHING   Items with * indicate a potential emergency and should be followed up as soon as possible or go to the Emergency Department if any problems should occur.  Please show the CHEMOTHERAPY ALERT CARD  or IMMUNOTHERAPY ALERT CARD at check-in to the Emergency Department and triage nurse.  Should you have questions after your visit or need to cancel or reschedule your appointment, please contact [facility] [phone]  and follow the prompts.  Office hours are 8:00 a.m. to 4:30 p.m. Monday - Friday. Please note that voicemails left after 4:00 p.m. may not be returned until the following business day.  We are closed weekends and major holidays. You have access to a nurse at all times for urgent questions. Please call the main number to the clinic [phone] and follow the prompts.  For any non-urgent questions, you may also contact your provider using MyChart. We now offer e-Visits for anyone 81 and older to request care online for non-urgent symptoms. For details visit [email or web address].   Also download the MyChart app! Go to the app store, search "MyChart", open the app, select [place], and log in with your MyChart username and password.  Due to Covid, a mask is required upon entering the hospital/clinic. If you do not have a mask, one will be given to you upon arrival. For doctor visits, patients may have 1 support person aged 81 or older with them. For treatment visits, patients cannot have anyone with them due to current Covid guidelines and our immunocompromised population.

## 2021-06-20 LAB — PROTEIN ELECTROPHORESIS, SERUM
A/G Ratio: 1.4 (ref 0.7–1.7)
Albumin ELP: 3.4 g/dL (ref 2.9–4.4)
Alpha-1-Globulin: 0.2 g/dL (ref 0.0–0.4)
Alpha-2-Globulin: 0.7 g/dL (ref 0.4–1.0)
Beta Globulin: 0.9 g/dL (ref 0.7–1.3)
Gamma Globulin: 0.7 g/dL (ref 0.4–1.8)
Globulin, Total: 2.4 g/dL (ref 2.2–3.9)
Total Protein ELP: 5.8 g/dL — ABNORMAL LOW (ref 6.0–8.5)

## 2021-06-20 LAB — KAPPA/LAMBDA LIGHT CHAINS
Kappa free light chain: 26.1 mg/L — ABNORMAL HIGH (ref 3.3–19.4)
Kappa, lambda light chain ratio: 1.6 (ref 0.26–1.65)
Lambda free light chains: 16.3 mg/L (ref 5.7–26.3)

## 2021-06-24 ENCOUNTER — Ambulatory Visit (HOSPITAL_COMMUNITY): Payer: Medicare Other | Admitting: Hematology

## 2021-06-24 ENCOUNTER — Ambulatory Visit (HOSPITAL_COMMUNITY): Payer: Medicare Other

## 2021-06-24 ENCOUNTER — Other Ambulatory Visit (HOSPITAL_COMMUNITY): Payer: Self-pay

## 2021-06-24 ENCOUNTER — Inpatient Hospital Stay (HOSPITAL_COMMUNITY): Payer: Medicare Other

## 2021-06-24 DIAGNOSIS — C9000 Multiple myeloma not having achieved remission: Secondary | ICD-10-CM

## 2021-06-24 MED ORDER — LENALIDOMIDE 20 MG PO CAPS: ORAL_CAPSULE | ORAL | 0 refills | Status: DC

## 2021-06-24 NOTE — Telephone Encounter (Signed)
Chart reviewed, Revlimid refilled per last office note with Dr. Delton Coombes.

## 2021-06-25 NOTE — Progress Notes (Signed)
[facility] [address], [place] [address]   CLINIC:  Medical Oncology/Hematology  PCP:   [name], MD [address] / [place] [address] [phone]  REASON FOR VISIT:   Follow-up for multiple myeloma  PRIOR THERAPY: None  NGS Results: Not done  CURRENT THERAPY: Velcade 3/4 weeks; Revlimid 20 mg 2/3 weeks  BRIEF ONCOLOGIC HISTORY:  Oncology History  Multiple myeloma not having achieved remission ([facility])  01/20/2018 Initial Diagnosis   Multiple myeloma not having achieved remission ([facility])    01/26/2018 -  Chemotherapy    Patient is on Treatment Plan: MYELOMA  RVD SQ (BORTEZOMIB D 1,8,15 ) Q28D X 4 CYCLES         CANCER STAGING: Cancer Staging No matching staging information was found for the patient.  INTERVAL HISTORY:  Brittany Archer, a 80 y.o. female, returns for routine follow-up and consideration for next cycle of chemotherapy.  Due for cycle #43 of Velcade today.   She is doing well, not very compliant with potassium and mag supplements according to family member She denies any other complaints, taking revelimid, acyclovir, dex and aspirin as directed   REVIEW OF SYSTEMS:  Review of Systems  Constitutional:  Positive for fatigue (75%). Negative for appetite change.  Gastrointestinal:  Negative for diarrhea.  Musculoskeletal:  Negative for arthralgias and myalgias.  Neurological:  Negative for numbness.  All other systems reviewed and are negative.  PAST MEDICAL/SURGICAL HISTORY:  Past Medical History:  Diagnosis Date   Breast cancer ([facility])    left breast/ 2008/ surg/ rad tx   Coronary artery disease    Diabetes mellitus    Past Surgical History:  Procedure Laterality Date   ABDOMINAL HYSTERECTOMY     BREAST SURGERY     DEBRIDEMENT MANDIBLE N/A 02/22/2020   Procedure: INCISION AND DRAINAGE WITH DEBRIDEMENT MANDIBLE;  Surgeon: Michael Litter, DMD;  Location: [facility];  Service: Oral Surgery;  Laterality: N/A;    DEBRIDEMENT MANDIBLE Right 03/19/2021   Procedure: DEBRIDEMENT OF BONE RIGHT INTERIOR  MANDIBLE;  Surgeon: Michael Litter, DMD;  Location: [facility];  Service: Oral Surgery;  Laterality: Right;   EYE SURGERY  2021   cataract removals    TOOTH EXTRACTION N/A 02/22/2020   Procedure: DENTAL RESTORATION/EXTRACTIONS;  Surgeon: Michael Litter, DMD;  Location: [facility];  Service: Oral Surgery;  Laterality: N/A;  DENTAL KIT REQUESTED    SOCIAL HISTORY:  Social History   Socioeconomic History   Marital status: Divorced    Spouse name: Not on file   Number of children: Not on file   Years of education: Not on file   Highest education level: Not on file  Occupational History   Not on file  Tobacco Use   Smoking status: Never   Smokeless tobacco: Never  Vaping Use   Vaping Use: Never used  Substance and Sexual Activity   Alcohol use: No   Drug use: No   Sexual activity: Yes    Birth control/protection: Surgical  Other Topics Concern   Not on file  Social History Narrative   Not on file   Social Determinants of Health   Financial Resource Strain: Low Risk    Difficulty of Paying Living Expenses: Not hard at all  Food Insecurity: No Food Insecurity   Worried About Charity fundraiser in the Last Year: Never true   [place] in the Last Year: Never true  Transportation Needs: No Transportation Needs  Lack of Transportation (Medical): No   Lack of Transportation (Non-Medical): No  Physical Activity: Inactive   Days of Exercise per Week: 0 days   Minutes of Exercise per Session: 0 min  Stress: No Stress Concern Present   Feeling of Stress : Not at all  Social Connections: Moderately Isolated   Frequency of Communication with Friends and Family: More than three times a week   Frequency of Social Gatherings with Friends and Family: More than three times a week   Attends Religious Services: More than 4 times per year   Active Member of [employer] or Organizations: No   Attends Theatre manager Meetings: Never   Marital Status: Divorced  Human resources officer Violence: Not At Risk   Fear of Current or Ex-Partner: No   Emotionally Abused: No   Physically Abused: No   Sexually Abused: No    FAMILY HISTORY:  Family History  Problem Relation Age of Onset   Obesity Sister     CURRENT MEDICATIONS:  Current Outpatient Medications  Medication Sig Dispense Refill   acyclovir (ZOVIRAX) 400 MG tablet TAKE 1 TABLET BY MOUTH TWICE DAILY (Patient taking differently: Take 400 mg by mouth 2 (two) times daily.) 60 tablet 11   aspirin 81 MG tablet Take 81 mg by mouth daily.     bortezomib IV (VELCADE) 3.5 MG injection Inject 3.5 mg into the vein once a week. weekly     chlorhexidine (PERIDEX) 0.12 % solution Use as directed 15 mLs in the mouth or throat 3 (three) times daily.     dexamethasone (DECADRON) 4 MG tablet Take 10 tablets (40 mg) on days 1, 8, and 15 of chemo. Repeat every 21 days. (Patient taking differently: Take 4 mg by mouth See admin instructions. Take 10 tablets (40 mg) on days 1, 8, and 15 of chemo. Repeat every 21 days.) 30 tablet 3   glipiZIDE (GLUCOTROL) 5 MG tablet Take 5 mg by mouth daily before breakfast.      glucose blood (ACCU-CHEK AVIVA PLUS) test strip CHECK BLOOD SUGAR ONCE DAILY     lenalidomide (REVLIMID) 20 MG capsule Take 20 mg capsule by mouth once daily for 14 days on, and 7 days off of a 21 day cycle. 14 capsule 0   lisinopril-hydrochlorothiazide (PRINZIDE,ZESTORETIC) 20-25 MG tablet Take 1 tablet by mouth daily.     magnesium oxide (MAG-OX) 400 (241.3 Mg) MG tablet Take 1 tablet (400 mg total) by mouth in the morning, at noon, and at bedtime. 90 tablet 6   metFORMIN (GLUCOPHAGE) 1000 MG tablet Take 1,000 mg by mouth 2 (two) times daily with a meal.     potassium chloride 20 MEQ/15ML (10%) SOLN Take 6 mEq by mouth 3 (three) times daily. 5 ml     potassium chloride SA (KLOR-CON) 20 MEQ tablet TAKE (2) TABLETS BY MOUTH THREE TIMES DAILY. 168 tablet 3    No current facility-administered medications for this visit.   Facility-Administered Medications Ordered in Other Visits  Medication Dose Route Frequency Provider Last Rate Last Admin   bortezomib SQ (VELCADE) chemo injection (2.8m/mL concentration) 2.25 mg  1.3 mg/m2 (Treatment Plan Recorded) Subcutaneous Once KDerek Jack MD       cyanocobalamin ((VITAMIN B-12)) injection 1,000 mcg  1,000 mcg Intramuscular Once KDerek Jack MD       dexamethasone (DECADRON) tablet 40 mg  40 mg Oral Once KDerek Jack MD       prochlorperazine (COMPAZINE) tablet 10 mg  10 mg Oral Once KNorth [place]  Dirk Dress, MD        ALLERGIES:  Allergies  Allergen Reactions   Seasonal Ic [Cholestatin] Other (See Comments)    Sneezing, watery eyes   Motrin [Ibuprofen] Rash    PHYSICAL EXAM:  Performance status (ECOG): 1 - Symptomatic but completely ambulatory  Vitals:   06/26/21 0950  BP: (!) 136/52  Pulse: (!) 59  Resp: 18  Temp: 97.9 F (36.6 C)  SpO2: 98%   Wt Readings from Last 3 Encounters:  06/26/21 164 lb 4.8 oz (74.5 kg)  06/19/21 164 lb 10.9 oz (74.7 kg)  06/10/21 166 lb 12.8 oz (75.7 kg)   Physical Exam Vitals reviewed.  Constitutional:      Appearance: Normal appearance.  HENT:     Mouth/Throat:     Lips: No lesions.     Mouth: No oral lesions.     Tongue: No lesions.     Comments: Exposed bone on right lower mandible Cardiovascular:     Rate and Rhythm: Normal rate and regular rhythm.     Pulses: Normal pulses.     Heart sounds: Normal heart sounds.  Pulmonary:     Effort: Pulmonary effort is normal.     Breath sounds: Normal breath sounds.  Musculoskeletal:     Right lower leg: Edema (trace) present.     Left lower leg: Edema (trace) present.  Neurological:     General: No focal deficit present.     Mental Status: She is alert and oriented to person, place, and time.  Psychiatric:        Mood and Affect: Mood normal.        Behavior: Behavior  normal.    LABORATORY DATA:  I have reviewed the labs as listed.  CBC Latest Ref Rng & Units 06/26/2021 06/19/2021 06/10/2021  WBC 4.0 - 10.5 K/uL 4.6 3.6(L) 3.2(L)  Hemoglobin 12.0 - 15.0 g/dL 10.2(L) 10.2(L) 9.7(L)  Hematocrit 36.0 - 46.0 % 31.2(L) 31.4(L) 30.0(L)  Platelets 150 - 400 K/uL 143(L) 156 186   CMP Latest Ref Rng & Units 06/26/2021 06/19/2021 06/10/2021  Glucose 70 - 99 mg/dL 115(H) 88 149(H)  BUN 8 - 23 mg/dL '19 14 17  ' Creatinine 0.44 - 1.00 mg/dL 1.03(H) 0.98 1.06(H)  Sodium 135 - 145 mmol/L 139 141 140  Potassium 3.5 - 5.1 mmol/L 2.6(LL) 3.1(L) 3.3(L)  Chloride 98 - 111 mmol/L 105 105 110  CO2 22 - 32 mmol/L '26 26 23  ' Calcium 8.9 - 10.3 mg/dL 9.4 9.4 9.0  Total Protein 6.5 - 8.1 g/dL 6.2(L) 6.1(L) 5.9(L)  Total Bilirubin 0.3 - 1.2 mg/dL 0.8 1.2 1.1  Alkaline Phos 38 - 126 U/L 80 68 61  AST 15 - 41 U/L 9(L) 12(L) 12(L)  ALT 0 - 44 U/L '8 10 7   ' Lab Results  Component Value Date   LDH 143 06/19/2021   LDH 136 04/29/2021   LDH 131 04/01/2021   Lab Results  Component Value Date   TOTALPROTELP 5.8 (L) 06/19/2021   ALBUMINELP 3.4 06/19/2021   A1GS 0.2 06/19/2021   A2GS 0.7 06/19/2021   BETS 0.9 06/19/2021   GAMS 0.7 06/19/2021   MSPIKE Not Observed 06/19/2021   SPEI Comment 06/19/2021    Lab Results  Component Value Date   KPAFRELGTCHN 26.1 (H) 06/19/2021   LAMBDASER 16.3 06/19/2021   KAPLAMBRATIO 1.60 06/19/2021    DIAGNOSTIC IMAGING:  I have independently reviewed the scans and discussed with the patient. No results found.   ASSESSMENT:   1.  IgA kappa plasma cell myeloma, stage I: -RVD started on 01/09/2018, held since 02/14/2020 due to mandible abscess. -Myeloma labs on 05/14/2020 showed progression with M spike of 0.5 g. -RVD started back on 05/21/2020. -Myeloma labs on 07/10/2020 shows M spike improved to 0.3 g from 0.6 g previously.  Free light chain ratio is 1.74 with kappa light chains 28.4. -Myeloma panel from 08/14/2020 shows M spike 0.4 g.   Kappa light chains are 24.8 and ratio is 2.23.   2.  Osteomyelitis of the right mandible/dental abscess: -Finished IV ceftriaxone on 04/03/2020.  Finished oral antibiotics. -We will hold Xgeva indefinitely.   PLAN:  1.  IgA kappa plasma cell myeloma, stage I: -She is taking Revlimid 20 mg 2 weeks on/1 week off.  She is taking Velcade 3 weeks on/1 week off.  She is also taking dexamethasone 40 mg weekly. Can consider dex 20 mg weekly given her age. -Reviewed myeloma labs from June, no M protein, K/L ratio normal. - continue current plan. Discuss with Dr Raliegh Ip if there is role for dex 20 weekly.  2.  Severe hypokalemia: -She is hot taking potassium supplements Discussed that severe hypokalemia will cause arrhythmias and can be life threatening. Encouraged compliance, She doesn't have central line access for IV K and she was not happy about IV pot in peripheral line.   3.  Osteomyelitis of the right mandible/dental abscess: -She has bone exposure in the right lower jaw.  Bisphosphonates on hold indefinitely.   4.  Hypomagnesemia: -Continue magnesium supplements 3 times a day. Again, encouraged compliance for the above mentioned reasons.   5.  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  Hemoglobin today is 10.2 with MCV 103 -Continue Retacrit as needed if hemoglobin drops below 10.   Orders placed this encounter:  No orders of the defined types were placed in this encounter.  Benay Pike MD

## 2021-06-26 ENCOUNTER — Encounter (HOSPITAL_COMMUNITY): Payer: Self-pay | Admitting: Hematology and Oncology

## 2021-06-26 ENCOUNTER — Inpatient Hospital Stay (HOSPITAL_BASED_OUTPATIENT_CLINIC_OR_DEPARTMENT_OTHER): Payer: Medicare Other | Admitting: Hematology and Oncology

## 2021-06-26 ENCOUNTER — Other Ambulatory Visit: Payer: Self-pay

## 2021-06-26 ENCOUNTER — Inpatient Hospital Stay (HOSPITAL_COMMUNITY): Payer: Medicare Other

## 2021-06-26 VITALS — BP 136/52 | HR 59 | Temp 97.9°F | Resp 18 | Wt 164.3 lb

## 2021-06-26 DIAGNOSIS — C9000 Multiple myeloma not having achieved remission: Secondary | ICD-10-CM | POA: Diagnosis not present

## 2021-06-26 DIAGNOSIS — Z5112 Encounter for antineoplastic immunotherapy: Secondary | ICD-10-CM | POA: Diagnosis not present

## 2021-06-26 DIAGNOSIS — D649 Anemia, unspecified: Secondary | ICD-10-CM

## 2021-06-26 DIAGNOSIS — E876 Hypokalemia: Secondary | ICD-10-CM | POA: Diagnosis not present

## 2021-06-26 DIAGNOSIS — E538 Deficiency of other specified B group vitamins: Secondary | ICD-10-CM

## 2021-06-26 LAB — CBC WITH DIFFERENTIAL/PLATELET
Abs Immature Granulocytes: 0.02 10*3/uL (ref 0.00–0.07)
Basophils Absolute: 0 10*3/uL (ref 0.0–0.1)
Basophils Relative: 0 %
Eosinophils Absolute: 0.2 10*3/uL (ref 0.0–0.5)
Eosinophils Relative: 4 %
HCT: 31.2 % — ABNORMAL LOW (ref 36.0–46.0)
Hemoglobin: 10.2 g/dL — ABNORMAL LOW (ref 12.0–15.0)
Immature Granulocytes: 0 %
Lymphocytes Relative: 17 %
Lymphs Abs: 0.8 10*3/uL (ref 0.7–4.0)
MCH: 33.8 pg (ref 26.0–34.0)
MCHC: 32.7 g/dL (ref 30.0–36.0)
MCV: 103.3 fL — ABNORMAL HIGH (ref 80.0–100.0)
Monocytes Absolute: 0.5 10*3/uL (ref 0.1–1.0)
Monocytes Relative: 11 %
Neutro Abs: 3.1 10*3/uL (ref 1.7–7.7)
Neutrophils Relative %: 68 %
Platelets: 143 10*3/uL — ABNORMAL LOW (ref 150–400)
RBC: 3.02 MIL/uL — ABNORMAL LOW (ref 3.87–5.11)
RDW: 15.8 % — ABNORMAL HIGH (ref 11.5–15.5)
WBC: 4.6 10*3/uL (ref 4.0–10.5)
nRBC: 0 % (ref 0.0–0.2)

## 2021-06-26 LAB — COMPREHENSIVE METABOLIC PANEL
ALT: 8 U/L (ref 0–44)
AST: 9 U/L — ABNORMAL LOW (ref 15–41)
Albumin: 3.5 g/dL (ref 3.5–5.0)
Alkaline Phosphatase: 80 U/L (ref 38–126)
Anion gap: 8 (ref 5–15)
BUN: 19 mg/dL (ref 8–23)
CO2: 26 mmol/L (ref 22–32)
Calcium: 9.4 mg/dL (ref 8.9–10.3)
Chloride: 105 mmol/L (ref 98–111)
Creatinine, Ser: 1.03 mg/dL — ABNORMAL HIGH (ref 0.44–1.00)
GFR, Estimated: 55 mL/min — ABNORMAL LOW (ref >60)
Glucose, Bld: 115 mg/dL — ABNORMAL HIGH (ref 70–99)
Potassium: 2.6 mmol/L — CL (ref 3.5–5.1)
Sodium: 139 mmol/L (ref 135–145)
Total Bilirubin: 0.8 mg/dL (ref 0.3–1.2)
Total Protein: 6.2 g/dL — ABNORMAL LOW (ref 6.5–8.1)

## 2021-06-26 MED ORDER — DEXAMETHASONE 4 MG PO TABS
40.0000 mg | ORAL_TABLET | Freq: Once | ORAL | Status: AC
  Administered 2021-06-26: 40 mg via ORAL
  Filled 2021-06-26: qty 10

## 2021-06-26 MED ORDER — CYANOCOBALAMIN 1000 MCG/ML IJ SOLN
1000.0000 ug | Freq: Once | INTRAMUSCULAR | Status: AC
  Administered 2021-06-26: 1000 ug via INTRAMUSCULAR
  Filled 2021-06-26: qty 1

## 2021-06-26 MED ORDER — BORTEZOMIB CHEMO SQ INJECTION 3.5 MG (2.5MG/ML)
1.3000 mg/m2 | Freq: Once | INTRAMUSCULAR | Status: AC
  Administered 2021-06-26: 2.25 mg via SUBCUTANEOUS
  Filled 2021-06-26: qty 0.9

## 2021-06-26 MED ORDER — PROCHLORPERAZINE MALEATE 10 MG PO TABS
10.0000 mg | ORAL_TABLET | Freq: Once | ORAL | Status: AC
  Administered 2021-06-26: 10 mg via ORAL
  Filled 2021-06-26: qty 1

## 2021-06-26 NOTE — Progress Notes (Signed)
Patient has been assessed, vital signs and labs have been reviewed by Dr. Chryl Heck. ANC, Creatinine, LFTs, and Platelets are within treatment parameters per Dr. Chryl Heck. Note from Dr. Chryl Heck -  she doesn't have a central line, so I dont think we can give much via periphery. She is supposed to take potassium supplements as instructed, discussed this with her today. The patient is good to proceed with treatment at this time.  Primary RN and pharmacy aware.

## 2021-06-26 NOTE — Patient Instructions (Signed)
[facility]  Discharge Instructions: Thank you for choosing [facility] to provide your oncology and hematology care.  If you have a lab appointment with the [facility], please come in thru the Main Entrance and check in at the main information desk.  Wear comfortable clothing and clothing appropriate for easy access to any Portacath or PICC line.   We strive to give you quality time with your provider. You may need to reschedule your appointment if you arrive late (15 or more minutes).  Arriving late affects you and other patients whose appointments are after yours.  Also, if you miss three or more appointments without notifying the office, you may be dismissed from the clinic at the provider's discretion.      For prescription refill requests, have your pharmacy contact our office and allow 72 hours for refills to be completed.    Today you received the following chemotherapy and/or immunotherapy agents: Velcade and Vitamin B12. Dr. Chryl Heck advises you to continue to take potassium tablets at home.    To help prevent nausea and vomiting after your treatment, we encourage you to take your nausea medication as directed.  BELOW ARE SYMPTOMS THAT SHOULD BE REPORTED IMMEDIATELY: *FEVER GREATER THAN 100.4 F (38 C) OR HIGHER *CHILLS OR SWEATING *NAUSEA AND VOMITING THAT IS NOT CONTROLLED WITH YOUR NAUSEA MEDICATION *UNUSUAL SHORTNESS OF BREATH *UNUSUAL BRUISING OR BLEEDING *URINARY PROBLEMS (pain or burning when urinating, or frequent urination) *BOWEL PROBLEMS (unusual diarrhea, constipation, pain near the anus) TENDERNESS IN MOUTH AND THROAT WITH OR WITHOUT PRESENCE OF ULCERS (sore throat, sores in mouth, or a toothache) UNUSUAL RASH, SWELLING OR PAIN  UNUSUAL VAGINAL DISCHARGE OR ITCHING   Items with * indicate a potential emergency and should be followed up as soon as possible or go to the Emergency Department if any problems should occur.  Please show the  CHEMOTHERAPY ALERT CARD or IMMUNOTHERAPY ALERT CARD at check-in to the Emergency Department and triage nurse.  Should you have questions after your visit or need to cancel or reschedule your appointment, please contact [facility] [phone]  and follow the prompts.  Office hours are 8:00 a.m. to 4:30 p.m. Monday - Friday. Please note that voicemails left after 4:00 p.m. may not be returned until the following business day.  We are closed weekends and major holidays. You have access to a nurse at all times for urgent questions. Please call the main number to the clinic [phone] and follow the prompts.  For any non-urgent questions, you may also contact your provider using MyChart. We now offer e-Visits for anyone 20 and older to request care online for non-urgent symptoms. For details visit [email or web address].   Also download the MyChart app! Go to the app store, search "MyChart", open the app, select [facility], and log in with your MyChart username and password.  Due to Covid, a mask is required upon entering the hospital/clinic. If you do not have a mask, one will be given to you upon arrival. For doctor visits, patients may have 1 support person aged 81 or older with them. For treatment visits, patients cannot have anyone with them due to current Covid guidelines and our immunocompromised population.

## 2021-06-26 NOTE — Progress Notes (Signed)
Patient tolerated Velcade injection with no complaints voiced. Lab work reviewed. See MAR for details. Injection site clean and dry with no bruising or swelling noted. Patient stable during and after injection. Band aid applied.   Patient tolerated Vitamin B12 injection with no complaints voiced. Site clean and dry with no bruising or swelling noted at site. See MAR for details. Band aid applied.  Patient stable during and after injection.   Retacrit injection not given d/t hemoglobin of 10.2.  Potassium 2.6, Dr. Chryl Heck made aware, no additional orders placed. Readdressed with patient and family member to take potassium as ordered at home. Both verbalized understanding. VSS. Patient left in satisfactory condition with no s/s of distress noted.

## 2021-06-26 NOTE — Progress Notes (Signed)
CRITICAL VALUE STICKER  CRITICAL VALUE:  K+ 2.6  RECEIVER (on-site recipient of call):  A. Ouida [place], [place] NOTIFIED: 06/26/2021 at 55  MD NOTIFIED:  [place]  RESPONSE:  Note from Dr. Chryl Heck -  she doesn't have a central line, so I dont think we can give much via periphery. She is supposed to take potassium supplements as instructed, discussed this with her today.

## 2021-07-08 ENCOUNTER — Inpatient Hospital Stay (HOSPITAL_COMMUNITY): Payer: Medicare Other

## 2021-07-08 ENCOUNTER — Other Ambulatory Visit: Payer: Self-pay

## 2021-07-08 ENCOUNTER — Encounter (HOSPITAL_COMMUNITY): Payer: Self-pay

## 2021-07-08 ENCOUNTER — Inpatient Hospital Stay (HOSPITAL_COMMUNITY): Payer: Medicare Other | Attending: Hematology

## 2021-07-08 VITALS — BP 129/57 | HR 66 | Temp 96.7°F | Resp 20 | Wt 172.4 lb

## 2021-07-08 DIAGNOSIS — N189 Chronic kidney disease, unspecified: Secondary | ICD-10-CM | POA: Insufficient documentation

## 2021-07-08 DIAGNOSIS — D631 Anemia in chronic kidney disease: Secondary | ICD-10-CM | POA: Diagnosis not present

## 2021-07-08 DIAGNOSIS — Z5112 Encounter for antineoplastic immunotherapy: Secondary | ICD-10-CM | POA: Insufficient documentation

## 2021-07-08 DIAGNOSIS — C9000 Multiple myeloma not having achieved remission: Secondary | ICD-10-CM

## 2021-07-08 DIAGNOSIS — E538 Deficiency of other specified B group vitamins: Secondary | ICD-10-CM | POA: Diagnosis not present

## 2021-07-08 LAB — CBC WITH DIFFERENTIAL/PLATELET
Abs Immature Granulocytes: 0.01 10*3/uL (ref 0.00–0.07)
Basophils Absolute: 0.1 10*3/uL (ref 0.0–0.1)
Basophils Relative: 1 %
Eosinophils Absolute: 0.1 10*3/uL (ref 0.0–0.5)
Eosinophils Relative: 3 %
HCT: 27.8 % — ABNORMAL LOW (ref 36.0–46.0)
Hemoglobin: 9 g/dL — ABNORMAL LOW (ref 12.0–15.0)
Immature Granulocytes: 0 %
Lymphocytes Relative: 25 %
Lymphs Abs: 1 10*3/uL (ref 0.7–4.0)
MCH: 33.5 pg (ref 26.0–34.0)
MCHC: 32.4 g/dL (ref 30.0–36.0)
MCV: 103.3 fL — ABNORMAL HIGH (ref 80.0–100.0)
Monocytes Absolute: 0.4 10*3/uL (ref 0.1–1.0)
Monocytes Relative: 11 %
Neutro Abs: 2.4 10*3/uL (ref 1.7–7.7)
Neutrophils Relative %: 60 %
Platelets: 174 10*3/uL (ref 150–400)
RBC: 2.69 MIL/uL — ABNORMAL LOW (ref 3.87–5.11)
RDW: 15.8 % — ABNORMAL HIGH (ref 11.5–15.5)
WBC: 4 10*3/uL (ref 4.0–10.5)
nRBC: 0 % (ref 0.0–0.2)

## 2021-07-08 LAB — COMPREHENSIVE METABOLIC PANEL
ALT: 11 U/L (ref 0–44)
AST: 13 U/L — ABNORMAL LOW (ref 15–41)
Albumin: 3.3 g/dL — ABNORMAL LOW (ref 3.5–5.0)
Alkaline Phosphatase: 70 U/L (ref 38–126)
Anion gap: 7 (ref 5–15)
BUN: 24 mg/dL — ABNORMAL HIGH (ref 8–23)
CO2: 23 mmol/L (ref 22–32)
Calcium: 8.6 mg/dL — ABNORMAL LOW (ref 8.9–10.3)
Chloride: 108 mmol/L (ref 98–111)
Creatinine, Ser: 1.06 mg/dL — ABNORMAL HIGH (ref 0.44–1.00)
GFR, Estimated: 53 mL/min — ABNORMAL LOW (ref >60)
Glucose, Bld: 180 mg/dL — ABNORMAL HIGH (ref 70–99)
Potassium: 3.3 mmol/L — ABNORMAL LOW (ref 3.5–5.1)
Sodium: 138 mmol/L (ref 135–145)
Total Bilirubin: 0.8 mg/dL (ref 0.3–1.2)
Total Protein: 6 g/dL — ABNORMAL LOW (ref 6.5–8.1)

## 2021-07-08 MED ORDER — PROCHLORPERAZINE MALEATE 10 MG PO TABS
10.0000 mg | ORAL_TABLET | Freq: Once | ORAL | Status: AC
  Administered 2021-07-08: 10 mg via ORAL
  Filled 2021-07-08: qty 1

## 2021-07-08 MED ORDER — DEXAMETHASONE 4 MG PO TABS
40.0000 mg | ORAL_TABLET | Freq: Once | ORAL | Status: AC
  Administered 2021-07-08: 40 mg via ORAL
  Filled 2021-07-08: qty 10

## 2021-07-08 MED ORDER — EPOETIN ALFA-EPBX 20000 UNIT/ML IJ SOLN
20000.0000 [IU] | Freq: Once | INTRAMUSCULAR | Status: AC
  Administered 2021-07-08: 20000 [IU] via SUBCUTANEOUS
  Filled 2021-07-08: qty 1

## 2021-07-08 MED ORDER — BORTEZOMIB CHEMO SQ INJECTION 3.5 MG (2.5MG/ML)
1.3000 mg/m2 | Freq: Once | INTRAMUSCULAR | Status: AC
  Administered 2021-07-08: 2.25 mg via SUBCUTANEOUS
  Filled 2021-07-08: qty 0.9

## 2021-07-08 NOTE — Patient Instructions (Signed)
Brittany Archer  Discharge Instructions: Thank you for choosing [facility] to provide your oncology and hematology care.  If you have a lab appointment with the [facility], please come in thru the Main Entrance and check in at the main information desk.  Wear comfortable clothing and clothing appropriate for easy access to any Portacath or PICC line.   We strive to give you quality time with your provider. You may need to reschedule your appointment if you arrive late (15 or more minutes).  Arriving late affects you and other patients whose appointments are after yours.  Also, if you miss three or more appointments without notifying the office, you may be dismissed from the clinic at the provider's discretion.      For prescription refill requests, have your pharmacy contact our office and allow 72 hours for refills to be completed.    Today you received the following chemotherapy and/or immunotherapy agents: Velcade and Retacrit. Return as scheduled.   To help prevent nausea and vomiting after your treatment, we encourage you to take your nausea medication as directed.  BELOW ARE SYMPTOMS THAT SHOULD BE REPORTED IMMEDIATELY: *FEVER GREATER THAN 100.4 F (38 C) OR HIGHER *CHILLS OR SWEATING *NAUSEA AND VOMITING THAT IS NOT CONTROLLED WITH YOUR NAUSEA MEDICATION *UNUSUAL SHORTNESS OF BREATH *UNUSUAL BRUISING OR BLEEDING *URINARY PROBLEMS (pain or burning when urinating, or frequent urination) *BOWEL PROBLEMS (unusual diarrhea, constipation, pain near the anus) TENDERNESS IN MOUTH AND THROAT WITH OR WITHOUT PRESENCE OF ULCERS (sore throat, sores in mouth, or a toothache) UNUSUAL RASH, SWELLING OR PAIN  UNUSUAL VAGINAL DISCHARGE OR ITCHING   Items with * indicate a potential emergency and should be followed up as soon as possible or go to the Emergency Department if any problems should occur.  Please show the CHEMOTHERAPY ALERT CARD or IMMUNOTHERAPY ALERT CARD at  check-in to the Emergency Department and triage nurse.  Should you have questions after your visit or need to cancel or reschedule your appointment, please contact [facility] [phone]  and follow the prompts.  Office hours are 8:00 a.m. to 4:30 p.m. Monday - Friday. Please note that voicemails left after 4:00 p.m. may not be returned until the following business day.  We are closed weekends and major holidays. You have access to a nurse at all times for urgent questions. Please call the main number to the clinic [phone] and follow the prompts.  For any non-urgent questions, you may also contact your provider using MyChart. We now offer e-Visits for anyone 37 and older to request care online for non-urgent symptoms. For details visit [email or web address].   Also download the MyChart app! Go to the app store, search "MyChart", open the app, select [facility], and log in with your MyChart username and password.  Due to Covid, a mask is required upon entering the hospital/clinic. If you do not have a mask, one will be given to you upon arrival. For doctor visits, patients may have 1 support person aged 51 or older with them. For treatment visits, patients cannot have anyone with them due to current Covid guidelines and our immunocompromised population.

## 2021-07-08 NOTE — Progress Notes (Signed)
Patient presents for Velcade and Retacrit today. Postassium 3.3 and BUN 24. Dr. Delton Coombes made aware, no further orders ordered and verbal order okay for treatment, plan signed.  Patient presents today for Retacrit injection. Hemoglobin reviewed prior to administration. VSS tolerated without incident or complaint. See MAR for details. Patient stable during and after injection.   Patient tolerated Velcade injection with no complaints voiced. Lab work reviewed. See MAR for details. Injection site clean and dry with no bruising or swelling noted. Patient stable during and after injection. Band aid applied. VSS. Patient left in satisfactory condition with no s/s of distress noted.

## 2021-07-15 ENCOUNTER — Inpatient Hospital Stay (HOSPITAL_COMMUNITY): Payer: Medicare Other

## 2021-07-15 ENCOUNTER — Other Ambulatory Visit: Payer: Self-pay

## 2021-07-15 ENCOUNTER — Encounter (HOSPITAL_COMMUNITY): Payer: Self-pay

## 2021-07-15 VITALS — BP 131/55 | HR 69 | Temp 96.9°F | Resp 18 | Wt 164.4 lb

## 2021-07-15 DIAGNOSIS — C9000 Multiple myeloma not having achieved remission: Secondary | ICD-10-CM

## 2021-07-15 DIAGNOSIS — Z5112 Encounter for antineoplastic immunotherapy: Secondary | ICD-10-CM | POA: Diagnosis not present

## 2021-07-15 LAB — CBC WITH DIFFERENTIAL/PLATELET
Abs Immature Granulocytes: 0.01 10*3/uL (ref 0.00–0.07)
Basophils Absolute: 0 10*3/uL (ref 0.0–0.1)
Basophils Relative: 1 %
Eosinophils Absolute: 0.2 10*3/uL (ref 0.0–0.5)
Eosinophils Relative: 5 %
HCT: 31.7 % — ABNORMAL LOW (ref 36.0–46.0)
Hemoglobin: 10.3 g/dL — ABNORMAL LOW (ref 12.0–15.0)
Immature Granulocytes: 0 %
Lymphocytes Relative: 25 %
Lymphs Abs: 0.7 10*3/uL (ref 0.7–4.0)
MCH: 34 pg (ref 26.0–34.0)
MCHC: 32.5 g/dL (ref 30.0–36.0)
MCV: 104.6 fL — ABNORMAL HIGH (ref 80.0–100.0)
Monocytes Absolute: 0.3 10*3/uL (ref 0.1–1.0)
Monocytes Relative: 12 %
Neutro Abs: 1.7 10*3/uL (ref 1.7–7.7)
Neutrophils Relative %: 57 %
Platelets: 173 10*3/uL (ref 150–400)
RBC: 3.03 MIL/uL — ABNORMAL LOW (ref 3.87–5.11)
RDW: 16.8 % — ABNORMAL HIGH (ref 11.5–15.5)
WBC: 2.9 10*3/uL — ABNORMAL LOW (ref 4.0–10.5)
nRBC: 0 % (ref 0.0–0.2)

## 2021-07-15 LAB — COMPREHENSIVE METABOLIC PANEL
ALT: 10 U/L (ref 0–44)
AST: 10 U/L — ABNORMAL LOW (ref 15–41)
Albumin: 3.7 g/dL (ref 3.5–5.0)
Alkaline Phosphatase: 76 U/L (ref 38–126)
Anion gap: 8 (ref 5–15)
BUN: 13 mg/dL (ref 8–23)
CO2: 25 mmol/L (ref 22–32)
Calcium: 9.6 mg/dL (ref 8.9–10.3)
Chloride: 104 mmol/L (ref 98–111)
Creatinine, Ser: 1.03 mg/dL — ABNORMAL HIGH (ref 0.44–1.00)
GFR, Estimated: 55 mL/min — ABNORMAL LOW (ref >60)
Glucose, Bld: 132 mg/dL — ABNORMAL HIGH (ref 70–99)
Potassium: 3.3 mmol/L — ABNORMAL LOW (ref 3.5–5.1)
Sodium: 137 mmol/L (ref 135–145)
Total Bilirubin: 0.8 mg/dL (ref 0.3–1.2)
Total Protein: 6.5 g/dL (ref 6.5–8.1)

## 2021-07-15 LAB — LACTATE DEHYDROGENASE: LDH: 138 U/L (ref 98–192)

## 2021-07-15 MED ORDER — DEXAMETHASONE 4 MG PO TABS
40.0000 mg | ORAL_TABLET | Freq: Once | ORAL | Status: AC
  Administered 2021-07-15: 40 mg via ORAL

## 2021-07-15 MED ORDER — DEXAMETHASONE 4 MG PO TABS
ORAL_TABLET | ORAL | Status: AC
  Filled 2021-07-15: qty 10

## 2021-07-15 MED ORDER — PROCHLORPERAZINE MALEATE 10 MG PO TABS
ORAL_TABLET | ORAL | Status: AC
  Filled 2021-07-15: qty 1

## 2021-07-15 MED ORDER — PROCHLORPERAZINE MALEATE 10 MG PO TABS
10.0000 mg | ORAL_TABLET | Freq: Once | ORAL | Status: AC
  Administered 2021-07-15: 10 mg via ORAL

## 2021-07-15 MED ORDER — BORTEZOMIB CHEMO SQ INJECTION 3.5 MG (2.5MG/ML)
1.3000 mg/m2 | Freq: Once | INTRAMUSCULAR | Status: AC
  Administered 2021-07-15: 2.25 mg via SUBCUTANEOUS
  Filled 2021-07-15: qty 0.9

## 2021-07-15 NOTE — Progress Notes (Signed)
Patient presents today for Velcade injection. HGB 10.3 today. No Retacrit needed today. Vital signs within parameters for treatment. Labs reviewed and within parameters for today's treatment. Patient has no complaints of any changes since her last visit. Patient's potassium 3.3 . Patient taking potassium at home as prescribed .   Patient states she is taking her Revlimid as prescribed with no side effects noted or concerns.   Treatment given today per MD orders. Tolerated without adverse affects. Vital signs stable. No complaints at this time. Discharged from clinic ambulatory in stable condition. Alert and oriented x 3. F/U with [facility] as scheduled.

## 2021-07-15 NOTE — Patient Instructions (Signed)
Brittany Archer  Discharge Instructions: Thank you for choosing [facility] to provide your oncology and hematology care.  If you have a lab appointment with the [facility], please come in thru the Main Entrance and check in at the main information desk.  Wear comfortable clothing and clothing appropriate for easy access to any Portacath or PICC line.   We strive to give you quality time with your provider. You may need to reschedule your appointment if you arrive late (15 or more minutes).  Arriving late affects you and other patients whose appointments are after yours.  Also, if you miss three or more appointments without notifying the office, you may be dismissed from the clinic at the provider's discretion.      For prescription refill requests, have your pharmacy contact our office and allow 72 hours for refills to be completed.    Today you received the following chemotherapy and/or immunotherapy agents Velcade .       To help prevent nausea and vomiting after your treatment, we encourage you to take your nausea medication as directed.  BELOW ARE SYMPTOMS THAT SHOULD BE REPORTED IMMEDIATELY: *FEVER GREATER THAN 100.4 F (38 C) OR HIGHER *CHILLS OR SWEATING *NAUSEA AND VOMITING THAT IS NOT CONTROLLED WITH YOUR NAUSEA MEDICATION *UNUSUAL SHORTNESS OF BREATH *UNUSUAL BRUISING OR BLEEDING *URINARY PROBLEMS (pain or burning when urinating, or frequent urination) *BOWEL PROBLEMS (unusual diarrhea, constipation, pain near the anus) TENDERNESS IN MOUTH AND THROAT WITH OR WITHOUT PRESENCE OF ULCERS (sore throat, sores in mouth, or a toothache) UNUSUAL RASH, SWELLING OR PAIN  UNUSUAL VAGINAL DISCHARGE OR ITCHING   Items with * indicate a potential emergency and should be followed up as soon as possible or go to the Emergency Department if any problems should occur.  Please show the CHEMOTHERAPY ALERT CARD or IMMUNOTHERAPY ALERT CARD at check-in to the Emergency  Department and triage nurse.  Should you have questions after your visit or need to cancel or reschedule your appointment, please contact [facility] [phone]  and follow the prompts.  Office hours are 8:00 a.m. to 4:30 p.m. Monday - Friday. Please note that voicemails left after 4:00 p.m. may not be returned until the following business day.  We are closed weekends and major holidays. You have access to a nurse at all times for urgent questions. Please call the main number to the clinic [phone] and follow the prompts.  For any non-urgent questions, you may also contact your provider using MyChart. We now offer e-Visits for anyone 26 and older to request care online for non-urgent symptoms. For details visit [email or web address].   Also download the MyChart app! Go to the app store, search "MyChart", open the app, select [facility], and log in with your MyChart username and password.  Due to Covid, a mask is required upon entering the hospital/clinic. If you do not have a mask, one will be given to you upon arrival. For doctor visits, patients may have 1 support person aged 45 or older with them. For treatment visits, patients cannot have anyone with them due to current Covid guidelines and our immunocompromised population.

## 2021-07-16 LAB — KAPPA/LAMBDA LIGHT CHAINS
Kappa free light chain: 26.4 mg/L — ABNORMAL HIGH (ref 3.3–19.4)
Kappa, lambda light chain ratio: 1.94 — ABNORMAL HIGH (ref 0.26–1.65)
Lambda free light chains: 13.6 mg/L (ref 5.7–26.3)

## 2021-07-17 ENCOUNTER — Other Ambulatory Visit (HOSPITAL_COMMUNITY): Payer: Self-pay

## 2021-07-17 DIAGNOSIS — C9000 Multiple myeloma not having achieved remission: Secondary | ICD-10-CM

## 2021-07-17 LAB — PROTEIN ELECTROPHORESIS, SERUM
A/G Ratio: 1.3 (ref 0.7–1.7)
Albumin ELP: 3.4 g/dL (ref 2.9–4.4)
Alpha-1-Globulin: 0.2 g/dL (ref 0.0–0.4)
Alpha-2-Globulin: 0.7 g/dL (ref 0.4–1.0)
Beta Globulin: 1 g/dL (ref 0.7–1.3)
Gamma Globulin: 0.7 g/dL (ref 0.4–1.8)
Globulin, Total: 2.6 g/dL (ref 2.2–3.9)
M-Spike, %: 0.4 g/dL — ABNORMAL HIGH
Total Protein ELP: 6 g/dL (ref 6.0–8.5)

## 2021-07-17 MED ORDER — LENALIDOMIDE 20 MG PO CAPS
ORAL_CAPSULE | ORAL | 0 refills | Status: DC
Start: 2021-07-17 — End: 2021-08-18

## 2021-07-17 NOTE — Telephone Encounter (Signed)
Chart reviewed. Revlimid refilled per last office note with Dr. Chryl Heck.

## 2021-07-17 NOTE — Telephone Encounter (Signed)
Chart reviewed. Revlimid refilled per last office note with dr/

## 2021-07-21 NOTE — Progress Notes (Signed)
[facility] [address] [place] [address]   CLINIC:  Medical Oncology/Hematology  PCP:  [name], MD [address] / [place] [address] [phone]   REASON FOR VISIT:  Follow-up for multiple myeloma  PRIOR THERAPY: none  NGS Results: not done  CURRENT THERAPY: Velcade 3/4 weeks; Revlimid 20 mg 2/3 weeks  BRIEF ONCOLOGIC HISTORY:  Oncology History  Multiple myeloma not having achieved remission ([facility])  01/20/2018 Initial Diagnosis   Multiple myeloma not having achieved remission ([facility])    01/26/2018 -  Chemotherapy    Patient is on Treatment Plan: MYELOMA  RVD SQ (BORTEZOMIB D 1,8,15 ) Q28D X 4 CYCLES         CANCER STAGING: Cancer Staging No matching staging information was found for the patient.  Virtual Visit via Video Note  I connected with Brittany Archer on '@TODAY' @ at 10:30 AM EDT by a video enabled telemedicine application and verified that I am speaking with the correct person using two identifiers.  Location: Patient: clinic Provider: home  Others present: Brittany Anis, LPN   I discussed the limitations of evaluation and management by telemedicine and the availability of in person appointments. The patient expressed understanding and agreed to proceed.  INTERVAL HISTORY:  [name], a 80 y.o. female, returns for routine follow-up and consideration for next cycle of chemotherapy. [name] was last seen on 05/13/21.  Due for day #15 cycle #44 of velcade today.   Overall, she tells me she has been feeling pretty well. She reports intermittent tingling and numbness in her right little finger. She denies n/v/d, new pains, increased fatigue, leg swellings, sleep disturbances, skin rash, recent infections, fevers, or appetite change. She is able to do all over her normal home activities.    Overall, she feels ready for next cycle of chemo today.   REVIEW OF SYSTEMS:  Review of Systems   Constitutional:  Positive for fatigue (60%). Negative for appetite change and fever.  Cardiovascular:  Negative for leg swelling.  Gastrointestinal:  Negative for diarrhea, nausea and vomiting.  Skin:  Negative for rash.  Neurological:  Positive for numbness (tingling and nubness in fingetips).  Psychiatric/Behavioral:  Negative for sleep disturbance.    PAST MEDICAL/SURGICAL HISTORY:  Past Medical History:  Diagnosis Date   Breast cancer ([facility])    left breast/ 2008/ surg/ rad tx   Coronary artery disease    Diabetes mellitus    Past Surgical History:  Procedure Laterality Date   ABDOMINAL HYSTERECTOMY     BREAST SURGERY     DEBRIDEMENT MANDIBLE N/A 02/22/2020   Procedure: INCISION AND DRAINAGE WITH DEBRIDEMENT MANDIBLE;  Surgeon: Michael Litter, DMD;  Location: [facility];  Service: Oral Surgery;  Laterality: N/A;   DEBRIDEMENT MANDIBLE Right 03/19/2021   Procedure: DEBRIDEMENT OF BONE RIGHT INTERIOR  MANDIBLE;  Surgeon: Michael Litter, DMD;  Location: [facility];  Service: Oral Surgery;  Laterality: Right;   EYE SURGERY  2021   cataract removals    TOOTH EXTRACTION N/A 02/22/2020   Procedure: DENTAL RESTORATION/EXTRACTIONS;  Surgeon: Michael Litter, DMD;  Location: [facility];  Service: Oral Surgery;  Laterality: N/A;  DENTAL KIT REQUESTED    SOCIAL HISTORY:  Social History   Socioeconomic History   Marital status: Divorced    Spouse name: Not on file   Number of children: Not on file   Years of education: Not on file   Highest education level: Not on file  Occupational  History   Not on file  Tobacco Use   Smoking status: Never   Smokeless tobacco: Never  Vaping Use   Vaping Use: Never used  Substance and Sexual Activity   Alcohol use: No   Drug use: No   Sexual activity: Yes    Birth control/protection: Surgical  Other Topics Concern   Not on file  Social History Narrative   Not on file   Social Determinants of Health   Financial Resource Strain: Low Risk    Difficulty of  Paying Living Expenses: Not hard at all  Food Insecurity: No Food Insecurity   Worried About Charity fundraiser in the Last Year: Never true   [place] in the Last Year: Never true  Transportation Needs: No Transportation Needs   Lack of Transportation (Medical): No   Lack of Transportation (Non-Medical): No  Physical Activity: Inactive   Days of Exercise per Week: 0 days   Minutes of Exercise per Session: 0 min  Stress: No Stress Concern Present   Feeling of Stress : Not at all  Social Connections: Moderately Isolated   Frequency of Communication with Friends and Family: More than three times a week   Frequency of Social Gatherings with Friends and Family: More than three times a week   Attends Religious Services: More than 4 times per year   Active Member of [employer] or Organizations: No   Attends Archivist Meetings: Never   Marital Status: Divorced  Human resources officer Violence: Not At Risk   Fear of Current or Ex-Partner: No   Emotionally Abused: No   Physically Abused: No   Sexually Abused: No    FAMILY HISTORY:  Family History  Problem Relation Age of Onset   Obesity Sister     CURRENT MEDICATIONS:  Current Outpatient Medications  Medication Sig Dispense Refill   acyclovir (ZOVIRAX) 400 MG tablet TAKE 1 TABLET BY MOUTH TWICE DAILY (Patient taking differently: Take 400 mg by mouth 2 (two) times daily.) 60 tablet 11   aspirin 81 MG tablet Take 81 mg by mouth daily.     bortezomib IV (VELCADE) 3.5 MG injection Inject 3.5 mg into the vein once a week. weekly     chlorhexidine (PERIDEX) 0.12 % solution Use as directed 15 mLs in the mouth or throat 3 (three) times daily.     dexamethasone (DECADRON) 4 MG tablet Take 10 tablets (40 mg) on days 1, 8, and 15 of chemo. Repeat every 21 days. (Patient taking differently: Take 4 mg by mouth See admin instructions. Take 10 tablets (40 mg) on days 1, 8, and 15 of chemo. Repeat every 21 days.) 30 tablet 3   glipiZIDE  (GLUCOTROL) 5 MG tablet Take 5 mg by mouth daily before breakfast.      glucose blood (ACCU-CHEK AVIVA PLUS) test strip CHECK BLOOD SUGAR ONCE DAILY     lenalidomide (REVLIMID) 20 MG capsule Take 20 mg capsule by mouth once daily for 14 days on, and 7 days off of a 21 day cycle. 14 capsule 0   lisinopril-hydrochlorothiazide (PRINZIDE,ZESTORETIC) 20-25 MG tablet Take 1 tablet by mouth daily.     magnesium oxide (MAG-OX) 400 (241.3 Mg) MG tablet Take 1 tablet (400 mg total) by mouth in the morning, at noon, and at bedtime. 90 tablet 6   metFORMIN (GLUCOPHAGE) 1000 MG tablet Take 1,000 mg by mouth 2 (two) times daily with a meal.     potassium chloride 20 MEQ/15ML (10%) SOLN Take  6 mEq by mouth 3 (three) times daily. 5 ml     potassium chloride SA (KLOR-CON) 20 MEQ tablet TAKE (2) TABLETS BY MOUTH THREE TIMES DAILY. 168 tablet 3   No current facility-administered medications for this visit.    ALLERGIES:  Allergies  Allergen Reactions   Seasonal Ic [Cholestatin] Other (See Comments)    Sneezing, watery eyes   Motrin [Ibuprofen] Rash   Performance status (ECOG): 1 - Symptomatic but completely ambulatory  There were no vitals filed for this visit. Wt Readings from Last 3 Encounters:  07/15/21 164 lb 6.4 oz (74.6 kg)  07/08/21 172 lb 6.4 oz (78.2 kg)  06/26/21 164 lb 4.8 oz (74.5 kg)   LABORATORY DATA:  I have reviewed the labs as listed.  CBC Latest Ref Rng & Units 07/15/2021 07/08/2021 06/26/2021  WBC 4.0 - 10.5 K/uL 2.9(L) 4.0 4.6  Hemoglobin 12.0 - 15.0 g/dL 10.3(L) 9.0(L) 10.2(L)  Hematocrit 36.0 - 46.0 % 31.7(L) 27.8(L) 31.2(L)  Platelets 150 - 400 K/uL 173 174 143(L)   CMP Latest Ref Rng & Units 07/15/2021 07/08/2021 06/26/2021  Glucose 70 - 99 mg/dL 132(H) 180(H) 115(H)  BUN 8 - 23 mg/dL 13 24(H) 19  Creatinine 0.44 - 1.00 mg/dL 1.03(H) 1.06(H) 1.03(H)  Sodium 135 - 145 mmol/L 137 138 139  Potassium 3.5 - 5.1 mmol/L 3.3(L) 3.3(L) 2.6(LL)  Chloride 98 - 111 mmol/L 104 108 105   CO2 22 - 32 mmol/L '25 23 26  ' Calcium 8.9 - 10.3 mg/dL 9.6 8.6(L) 9.4  Total Protein 6.5 - 8.1 g/dL 6.5 6.0(L) 6.2(L)  Total Bilirubin 0.3 - 1.2 mg/dL 0.8 0.8 0.8  Alkaline Phos 38 - 126 U/L 76 70 80  AST 15 - 41 U/L 10(L) 13(L) 9(L)  ALT 0 - 44 U/L '10 11 8    ' DIAGNOSTIC IMAGING:  I have independently reviewed the scans and discussed with the patient. No results found.   ASSESSMENT:  1.  IgA kappa plasma cell myeloma, stage I: -RVD started on 01/09/2018, held since 02/14/2020 due to mandible abscess. -Myeloma labs on 05/14/2020 showed progression with M spike of 0.5 g. -RVD started back on 05/21/2020. -Myeloma labs on 07/10/2020 shows M spike improved to 0.3 g from 0.6 g previously.  Free light chain ratio is 1.74 with kappa light chains 28.4. -Myeloma panel from 08/14/2020 shows M spike 0.4 g.  Kappa light chains are 24.8 and ratio is 2.23.   2.  Osteomyelitis of the right mandible/dental abscess: -Finished IV ceftriaxone on 04/03/2020.  Finished oral antibiotics. -We will hold Xgeva indefinitely.   PLAN:  1.  IgA kappa plasma cell myeloma, stage I: - We have reviewed myeloma labs from 07/15/2021.  M spike has increased to 0.4 g.  It was undetectable on 06/19/2021.  Free light chain ratio is 1.94, previously 1.6. - I have reviewed the trend of light chain ratio which is more or less stable. - Continue Velcade weekly.  We will cut back on dexamethasone to 20 mg weekly. - Continue Revlimid 20 mg 2 weeks on 1 week off. - She has occasional numbness in the right hand little finger.  No other numbness in other extremities.  No GI side effects.  She is able to do her day-to-day activities without any problems. - Recommend continuing same regimen at this time.  We will check myeloma labs in 7 weeks and RTC 8 weeks.   2.  Severe hypokalemia: - She reports taking potassium 3 times daily. - Potassium is 3.0  today.  Continue potassium supplements at home.   3.  Osteomyelitis of the right  mandible/dental abscess: - She had bone exposure in the right lower jaw.  Bisphosphonates on hold indefinitely.   4.  Hypomagnesemia: - Continue magnesium 1 tablet 3 times daily.  We will check magnesium level next time.   5.  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 - She is receiving Procrit 20,000 units as needed.  Today hemoglobin is 10.3. - Will consider checking ferritin and iron panel at next visit.   Orders placed this encounter:  No orders of the defined types were placed in this encounter.  I provided 30 minutes of non-face-to-face time during this encounter.  Derek Jack, MD [facility] [phone]   I, Thana Ates, am acting as a scribe for Dr. Derek Jack.  I, Derek Jack MD, have reviewed the above documentation for accuracy and completeness, and I agree with the above.

## 2021-07-22 ENCOUNTER — Other Ambulatory Visit (HOSPITAL_COMMUNITY): Payer: Medicare Other

## 2021-07-22 ENCOUNTER — Inpatient Hospital Stay (HOSPITAL_COMMUNITY): Payer: Medicare Other

## 2021-07-22 ENCOUNTER — Inpatient Hospital Stay (HOSPITAL_BASED_OUTPATIENT_CLINIC_OR_DEPARTMENT_OTHER): Payer: Medicare Other | Admitting: Hematology

## 2021-07-22 ENCOUNTER — Other Ambulatory Visit: Payer: Self-pay

## 2021-07-22 ENCOUNTER — Other Ambulatory Visit (HOSPITAL_COMMUNITY): Payer: Self-pay

## 2021-07-22 VITALS — BP 141/59 | HR 61 | Temp 96.7°F | Resp 18 | Wt 161.5 lb

## 2021-07-22 DIAGNOSIS — C9000 Multiple myeloma not having achieved remission: Secondary | ICD-10-CM

## 2021-07-22 DIAGNOSIS — Z5112 Encounter for antineoplastic immunotherapy: Secondary | ICD-10-CM | POA: Diagnosis not present

## 2021-07-22 DIAGNOSIS — E538 Deficiency of other specified B group vitamins: Secondary | ICD-10-CM

## 2021-07-22 LAB — CBC WITH DIFFERENTIAL/PLATELET
Abs Immature Granulocytes: 0.01 10*3/uL (ref 0.00–0.07)
Basophils Absolute: 0 10*3/uL (ref 0.0–0.1)
Basophils Relative: 0 %
Eosinophils Absolute: 0.1 10*3/uL (ref 0.0–0.5)
Eosinophils Relative: 3 %
HCT: 31.6 % — ABNORMAL LOW (ref 36.0–46.0)
Hemoglobin: 10.3 g/dL — ABNORMAL LOW (ref 12.0–15.0)
Immature Granulocytes: 0 %
Lymphocytes Relative: 19 %
Lymphs Abs: 0.6 10*3/uL — ABNORMAL LOW (ref 0.7–4.0)
MCH: 33.7 pg (ref 26.0–34.0)
MCHC: 32.6 g/dL (ref 30.0–36.0)
MCV: 103.3 fL — ABNORMAL HIGH (ref 80.0–100.0)
Monocytes Absolute: 0.3 10*3/uL (ref 0.1–1.0)
Monocytes Relative: 10 %
Neutro Abs: 2.1 10*3/uL (ref 1.7–7.7)
Neutrophils Relative %: 68 %
Platelets: 137 10*3/uL — ABNORMAL LOW (ref 150–400)
RBC: 3.06 MIL/uL — ABNORMAL LOW (ref 3.87–5.11)
RDW: 16.1 % — ABNORMAL HIGH (ref 11.5–15.5)
WBC: 3.2 10*3/uL — ABNORMAL LOW (ref 4.0–10.5)
nRBC: 0 % (ref 0.0–0.2)

## 2021-07-22 LAB — COMPREHENSIVE METABOLIC PANEL
ALT: 8 U/L (ref 0–44)
AST: 10 U/L — ABNORMAL LOW (ref 15–41)
Albumin: 3.5 g/dL (ref 3.5–5.0)
Alkaline Phosphatase: 78 U/L (ref 38–126)
Anion gap: 7 (ref 5–15)
BUN: 19 mg/dL (ref 8–23)
CO2: 27 mmol/L (ref 22–32)
Calcium: 9.5 mg/dL (ref 8.9–10.3)
Chloride: 105 mmol/L (ref 98–111)
Creatinine, Ser: 1 mg/dL (ref 0.44–1.00)
GFR, Estimated: 57 mL/min — ABNORMAL LOW (ref >60)
Glucose, Bld: 104 mg/dL — ABNORMAL HIGH (ref 70–99)
Potassium: 3 mmol/L — ABNORMAL LOW (ref 3.5–5.1)
Sodium: 139 mmol/L (ref 135–145)
Total Bilirubin: 1.3 mg/dL — ABNORMAL HIGH (ref 0.3–1.2)
Total Protein: 6.2 g/dL — ABNORMAL LOW (ref 6.5–8.1)

## 2021-07-22 MED ORDER — BORTEZOMIB CHEMO SQ INJECTION 3.5 MG (2.5MG/ML)
1.3000 mg/m2 | Freq: Once | INTRAMUSCULAR | Status: AC
  Administered 2021-07-22: 2.25 mg via SUBCUTANEOUS
  Filled 2021-07-22: qty 0.9

## 2021-07-22 MED ORDER — CYANOCOBALAMIN 1000 MCG/ML IJ SOLN
1000.0000 ug | Freq: Once | INTRAMUSCULAR | Status: AC
  Administered 2021-07-22: 1000 ug via INTRAMUSCULAR

## 2021-07-22 MED ORDER — EPOETIN ALFA-EPBX 20000 UNIT/ML IJ SOLN: 20000.0000 [IU] | Freq: Once | INTRAMUSCULAR | Status: DC

## 2021-07-22 MED ORDER — PROCHLORPERAZINE MALEATE 10 MG PO TABS
10.0000 mg | ORAL_TABLET | Freq: Once | ORAL | Status: AC
  Administered 2021-07-22: 10 mg via ORAL
  Filled 2021-07-22: qty 1

## 2021-07-22 MED ORDER — DEXAMETHASONE 4 MG PO TABS
20.0000 mg | ORAL_TABLET | Freq: Once | ORAL | Status: AC
  Administered 2021-07-22: 20 mg via ORAL
  Filled 2021-07-22: qty 5

## 2021-07-22 MED ORDER — CYANOCOBALAMIN 1000 MCG/ML IJ SOLN
INTRAMUSCULAR | Status: AC
  Filled 2021-07-22: qty 1

## 2021-07-22 NOTE — Progress Notes (Signed)
Labs reveiwed with MD today at virtual office visit. Will proceed with treatment. Will Hold retacrit today, hemoglobin 10.3  Treatment given per orders. Patient tolerated it well without problems. Vitals stable and discharged home from clinic ambulatory. Follow up as scheduled.

## 2021-07-22 NOTE — Patient Instructions (Addendum)
[place] at [facility] Discharge Instructions  You were seen today by Dr. Delton Coombes. He went over your recent results, and you received your treatment. Dr. Delton Coombes will see you back in 8 weeks for labs and follow up.   Thank you for choosing [place] at [facility] to provide your oncology and hematology care.  To afford each patient quality time with our provider, please arrive at least 15 minutes before your scheduled appointment time.   If you have a lab appointment with the [facility] please come in thru the Main Entrance and check in at the main information desk  You need to re-schedule your appointment should you arrive 10 or more minutes late.  We strive to give you quality time with our providers, and arriving late affects you and other patients whose appointments are after yours.  Also, if you no show three or more times for appointments you may be dismissed from the clinic at the providers discretion.     Again, thank you for choosing [facility].  Our hope is that these requests will decrease the amount of time that you wait before being seen by our physicians.       _____________________________________________________________  Should you have questions after your visit to [facility], please contact our office at [phone] between the hours of 8:00 a.m. and 4:30 p.m.  Voicemails left after 4:00 p.m. will not be returned until the following business day.  For prescription refill requests, have your pharmacy contact our office and allow 72 hours.    Cancer Center Support Programs:   > Cancer Support Group  2nd Tuesday of the month 1pm-2pm, Journey Room

## 2021-07-22 NOTE — Progress Notes (Signed)
Patient has been assessed, vital signs and labs have been reviewed by Dr. Delton Coombes. ANC, Creatinine, LFTs, and Platelets are within treatment parameters per Dr. Delton Coombes. Dr. Delton Coombes decreased the dexamethasone to 20 mg. The patient is good to proceed with treatment at this time.  Primary RN and pharmacy aware.

## 2021-07-22 NOTE — Patient Instructions (Signed)
[facility]  Discharge Instructions: Thank you for choosing [facility] to provide your oncology and hematology care.  If you have a lab appointment with the Cancer Center, please come in thru the Main Entrance and check in at the main information desk.  Wear comfortable clothing and clothing appropriate for easy access to any Portacath or PICC line.   We strive to give you quality time with your provider. You may need to reschedule your appointment if you arrive late (15 or more minutes).  Arriving late affects you and other patients whose appointments are after yours.  Also, if you miss three or more appointments without notifying the office, you may be dismissed from the clinic at the provider's discretion.      For prescription refill requests, have your pharmacy contact our office and allow 72 hours for refills to be completed.        To help prevent nausea and vomiting after your treatment, we encourage you to take your nausea medication as directed.  BELOW ARE SYMPTOMS THAT SHOULD BE REPORTED IMMEDIATELY: *FEVER GREATER THAN 100.4 F (38 C) OR HIGHER *CHILLS OR SWEATING *NAUSEA AND VOMITING THAT IS NOT CONTROLLED WITH YOUR NAUSEA MEDICATION *UNUSUAL SHORTNESS OF BREATH *UNUSUAL BRUISING OR BLEEDING *URINARY PROBLEMS (pain or burning when urinating, or frequent urination) *BOWEL PROBLEMS (unusual diarrhea, constipation, pain near the anus) TENDERNESS IN MOUTH AND THROAT WITH OR WITHOUT PRESENCE OF ULCERS (sore throat, sores in mouth, or a toothache) UNUSUAL RASH, SWELLING OR PAIN  UNUSUAL VAGINAL DISCHARGE OR ITCHING   Items with * indicate a potential emergency and should be followed up as soon as possible or go to the Emergency Department if any problems should occur.  Please show the CHEMOTHERAPY ALERT CARD or IMMUNOTHERAPY ALERT CARD at check-in to the Emergency Department and triage nurse.  Should you have questions after your visit or need to cancel  or reschedule your appointment, please contact [facility] [phone]  and follow the prompts.  Office hours are 8:00 a.m. to 4:30 p.m. Monday - Friday. Please note that voicemails left after 4:00 p.m. may not be returned until the following business day.  We are closed weekends and major holidays. You have access to a nurse at all times for urgent questions. Please call the main number to the clinic [phone] and follow the prompts.  For any non-urgent questions, you may also contact your provider using MyChart. We now offer e-Visits for anyone 18 and older to request care online for non-urgent symptoms. For details visit [email or web address].   Also download the MyChart app! Go to the app store, search "MyChart", open the app, select [facility], and log in with your MyChart username and password.  Due to Covid, a mask is required upon entering the hospital/clinic. If you do not have a mask, one will be given to you upon arrival. For doctor visits, patients may have 1 support person aged 18 or older with them. For treatment visits, patients cannot have anyone with them due to current Covid guidelines and our immunocompromised population.  

## 2021-08-05 ENCOUNTER — Inpatient Hospital Stay (HOSPITAL_COMMUNITY): Payer: Medicare Other | Attending: Hematology

## 2021-08-05 ENCOUNTER — Encounter (HOSPITAL_COMMUNITY): Payer: Self-pay

## 2021-08-05 ENCOUNTER — Inpatient Hospital Stay (HOSPITAL_COMMUNITY): Payer: Medicare Other

## 2021-08-05 ENCOUNTER — Other Ambulatory Visit: Payer: Self-pay

## 2021-08-05 VITALS — BP 122/61 | HR 61 | Temp 97.7°F | Resp 18 | Wt 165.5 lb

## 2021-08-05 DIAGNOSIS — E538 Deficiency of other specified B group vitamins: Secondary | ICD-10-CM | POA: Insufficient documentation

## 2021-08-05 DIAGNOSIS — C9000 Multiple myeloma not having achieved remission: Secondary | ICD-10-CM | POA: Insufficient documentation

## 2021-08-05 DIAGNOSIS — Z5112 Encounter for antineoplastic immunotherapy: Secondary | ICD-10-CM | POA: Insufficient documentation

## 2021-08-05 DIAGNOSIS — D631 Anemia in chronic kidney disease: Secondary | ICD-10-CM | POA: Diagnosis not present

## 2021-08-05 DIAGNOSIS — N189 Chronic kidney disease, unspecified: Secondary | ICD-10-CM | POA: Insufficient documentation

## 2021-08-05 LAB — CBC WITH DIFFERENTIAL/PLATELET
Abs Immature Granulocytes: 0.01 10*3/uL (ref 0.00–0.07)
Basophils Absolute: 0 10*3/uL (ref 0.0–0.1)
Basophils Relative: 1 %
Eosinophils Absolute: 0.1 10*3/uL (ref 0.0–0.5)
Eosinophils Relative: 4 %
HCT: 29.5 % — ABNORMAL LOW (ref 36.0–46.0)
Hemoglobin: 9.7 g/dL — ABNORMAL LOW (ref 12.0–15.0)
Immature Granulocytes: 0 %
Lymphocytes Relative: 28 %
Lymphs Abs: 0.9 10*3/uL (ref 0.7–4.0)
MCH: 33.8 pg (ref 26.0–34.0)
MCHC: 32.9 g/dL (ref 30.0–36.0)
MCV: 102.8 fL — ABNORMAL HIGH (ref 80.0–100.0)
Monocytes Absolute: 0.4 10*3/uL (ref 0.1–1.0)
Monocytes Relative: 12 %
Neutro Abs: 1.8 10*3/uL (ref 1.7–7.7)
Neutrophils Relative %: 55 %
Platelets: 219 10*3/uL (ref 150–400)
RBC: 2.87 MIL/uL — ABNORMAL LOW (ref 3.87–5.11)
RDW: 15.3 % (ref 11.5–15.5)
WBC: 3.2 10*3/uL — ABNORMAL LOW (ref 4.0–10.5)
nRBC: 0 % (ref 0.0–0.2)

## 2021-08-05 LAB — COMPREHENSIVE METABOLIC PANEL
ALT: 10 U/L (ref 0–44)
AST: 11 U/L — ABNORMAL LOW (ref 15–41)
Albumin: 3.5 g/dL (ref 3.5–5.0)
Alkaline Phosphatase: 82 U/L (ref 38–126)
Anion gap: 7 (ref 5–15)
BUN: 24 mg/dL — ABNORMAL HIGH (ref 8–23)
CO2: 24 mmol/L (ref 22–32)
Calcium: 9.4 mg/dL (ref 8.9–10.3)
Chloride: 111 mmol/L (ref 98–111)
Creatinine, Ser: 1.05 mg/dL — ABNORMAL HIGH (ref 0.44–1.00)
GFR, Estimated: 53 mL/min — ABNORMAL LOW (ref >60)
Glucose, Bld: 87 mg/dL (ref 70–99)
Potassium: 3.5 mmol/L (ref 3.5–5.1)
Sodium: 142 mmol/L (ref 135–145)
Total Bilirubin: 1.2 mg/dL (ref 0.3–1.2)
Total Protein: 6.3 g/dL — ABNORMAL LOW (ref 6.5–8.1)

## 2021-08-05 MED ORDER — EPOETIN ALFA-EPBX 20000 UNIT/ML IJ SOLN
INTRAMUSCULAR | Status: AC
  Filled 2021-08-05: qty 1

## 2021-08-05 MED ORDER — DEXAMETHASONE 4 MG PO TABS
20.0000 mg | ORAL_TABLET | Freq: Once | ORAL | Status: AC
  Administered 2021-08-05: 20 mg via ORAL
  Filled 2021-08-05: qty 5

## 2021-08-05 MED ORDER — PROCHLORPERAZINE MALEATE 10 MG PO TABS
10.0000 mg | ORAL_TABLET | Freq: Once | ORAL | Status: AC
  Administered 2021-08-05: 10 mg via ORAL
  Filled 2021-08-05: qty 1

## 2021-08-05 MED ORDER — EPOETIN ALFA-EPBX 20000 UNIT/ML IJ SOLN
20000.0000 [IU] | Freq: Once | INTRAMUSCULAR | Status: AC
  Administered 2021-08-05: 20000 [IU] via SUBCUTANEOUS
  Filled 2021-08-05: qty 1

## 2021-08-05 MED ORDER — BORTEZOMIB CHEMO SQ INJECTION 3.5 MG (2.5MG/ML)
1.3000 mg/m2 | Freq: Once | INTRAMUSCULAR | Status: AC
  Administered 2021-08-05: 2.25 mg via SUBCUTANEOUS
  Filled 2021-08-05: qty 0.9

## 2021-08-05 NOTE — Patient Instructions (Signed)
[facility]  Discharge Instructions: Thank you for choosing [facility] to provide your oncology and hematology care.  If you have a lab appointment with the [facility], please come in thru the Main Entrance and check in at the main information desk.  Wear comfortable clothing and clothing appropriate for easy access to any Portacath or PICC line.   We strive to give you quality time with your provider. You may need to reschedule your appointment if you arrive late (15 or more minutes).  Arriving late affects you and other patients whose appointments are after yours.  Also, if you miss three or more appointments without notifying the office, you may be dismissed from the clinic at the provider's discretion.      For prescription refill requests, have your pharmacy contact our office and allow 72 hours for refills to be completed.    Today you received the following chemotherapy and/or immunotherapy agents Velcade.       To help prevent nausea and vomiting after your treatment, we encourage you to take your nausea medication as directed.  BELOW ARE SYMPTOMS THAT SHOULD BE REPORTED IMMEDIATELY: *FEVER GREATER THAN 100.4 F (38 C) OR HIGHER *CHILLS OR SWEATING *NAUSEA AND VOMITING THAT IS NOT CONTROLLED WITH YOUR NAUSEA MEDICATION *UNUSUAL SHORTNESS OF BREATH *UNUSUAL BRUISING OR BLEEDING *URINARY PROBLEMS (pain or burning when urinating, or frequent urination) *BOWEL PROBLEMS (unusual diarrhea, constipation, pain near the anus) TENDERNESS IN MOUTH AND THROAT WITH OR WITHOUT PRESENCE OF ULCERS (sore throat, sores in mouth, or a toothache) UNUSUAL RASH, SWELLING OR PAIN  UNUSUAL VAGINAL DISCHARGE OR ITCHING   Items with * indicate a potential emergency and should be followed up as soon as possible or go to the Emergency Department if any problems should occur.  Please show the CHEMOTHERAPY ALERT CARD or IMMUNOTHERAPY ALERT CARD at check-in to the Emergency  Department and triage nurse.  Should you have questions after your visit or need to cancel or reschedule your appointment, please contact [facility] [phone]  and follow the prompts.  Office hours are 8:00 a.m. to 4:30 p.m. Monday - Friday. Please note that voicemails left after 4:00 p.m. may not be returned until the following business day.  We are closed weekends and major holidays. You have access to a nurse at all times for urgent questions. Please call the main number to the clinic [phone] and follow the prompts.  For any non-urgent questions, you may also contact your provider using MyChart. We now offer e-Visits for anyone 64 and older to request care online for non-urgent symptoms. For details visit [email or web address].   Also download the MyChart app! Go to the app store, search "MyChart", open the app, select [facility], and log in with your MyChart username and password.  Due to Covid, a mask is required upon entering the hospital/clinic. If you do not have a mask, one will be given to you upon arrival. For doctor visits, patients may have 1 support person aged 75 or older with them. For treatment visits, patients cannot have anyone with them due to current Covid guidelines and our immunocompromised population.

## 2021-08-05 NOTE — Progress Notes (Signed)
Patient presents today for Velcade injection. Vital signs within parameters for treatment. Labs pending. Patient states she is taking her Revlimid as prescribed with no side effects noted. Patient denies pain today. No complaints noted today per patient's words.   Labs within parameters for treatment. HGB 9.7 today. Retacrit 20,000 Units will be administered.    Treatment given today per MD orders. Tolerated without adverse affects. Vital signs stable. No complaints at this time. Discharged from clinic ambulatory in stable condition. Alert and oriented x 3. F/U with [facility] as scheduled.

## 2021-08-12 ENCOUNTER — Other Ambulatory Visit: Payer: Self-pay

## 2021-08-12 ENCOUNTER — Inpatient Hospital Stay (HOSPITAL_COMMUNITY): Payer: Medicare Other

## 2021-08-12 VITALS — BP 130/55 | HR 58 | Temp 98.3°F | Resp 16 | Wt 168.2 lb

## 2021-08-12 DIAGNOSIS — Z5112 Encounter for antineoplastic immunotherapy: Secondary | ICD-10-CM | POA: Diagnosis not present

## 2021-08-12 DIAGNOSIS — C9000 Multiple myeloma not having achieved remission: Secondary | ICD-10-CM

## 2021-08-12 LAB — COMPREHENSIVE METABOLIC PANEL
ALT: 12 U/L (ref 0–44)
AST: 12 U/L — ABNORMAL LOW (ref 15–41)
Albumin: 3.6 g/dL (ref 3.5–5.0)
Alkaline Phosphatase: 79 U/L (ref 38–126)
Anion gap: 8 (ref 5–15)
BUN: 24 mg/dL — ABNORMAL HIGH (ref 8–23)
CO2: 28 mmol/L (ref 22–32)
Calcium: 9.3 mg/dL (ref 8.9–10.3)
Chloride: 104 mmol/L (ref 98–111)
Creatinine, Ser: 0.96 mg/dL (ref 0.44–1.00)
GFR, Estimated: 59 mL/min — ABNORMAL LOW (ref >60)
Glucose, Bld: 94 mg/dL (ref 70–99)
Potassium: 3.3 mmol/L — ABNORMAL LOW (ref 3.5–5.1)
Sodium: 140 mmol/L (ref 135–145)
Total Bilirubin: 0.8 mg/dL (ref 0.3–1.2)
Total Protein: 6.3 g/dL — ABNORMAL LOW (ref 6.5–8.1)

## 2021-08-12 LAB — CBC WITH DIFFERENTIAL/PLATELET
Abs Immature Granulocytes: 0.01 10*3/uL (ref 0.00–0.07)
Basophils Absolute: 0 10*3/uL (ref 0.0–0.1)
Basophils Relative: 1 %
Eosinophils Absolute: 0.1 10*3/uL (ref 0.0–0.5)
Eosinophils Relative: 3 %
HCT: 31 % — ABNORMAL LOW (ref 36.0–46.0)
Hemoglobin: 10.1 g/dL — ABNORMAL LOW (ref 12.0–15.0)
Immature Granulocytes: 0 %
Lymphocytes Relative: 27 %
Lymphs Abs: 1.1 10*3/uL (ref 0.7–4.0)
MCH: 34.1 pg — ABNORMAL HIGH (ref 26.0–34.0)
MCHC: 32.6 g/dL (ref 30.0–36.0)
MCV: 104.7 fL — ABNORMAL HIGH (ref 80.0–100.0)
Monocytes Absolute: 0.5 10*3/uL (ref 0.1–1.0)
Monocytes Relative: 12 %
Neutro Abs: 2.2 10*3/uL (ref 1.7–7.7)
Neutrophils Relative %: 57 %
Platelets: 168 10*3/uL (ref 150–400)
RBC: 2.96 MIL/uL — ABNORMAL LOW (ref 3.87–5.11)
RDW: 16 % — ABNORMAL HIGH (ref 11.5–15.5)
WBC: 3.9 10*3/uL — ABNORMAL LOW (ref 4.0–10.5)
nRBC: 0 % (ref 0.0–0.2)

## 2021-08-12 MED ORDER — DEXAMETHASONE 4 MG PO TABS
20.0000 mg | ORAL_TABLET | Freq: Once | ORAL | Status: AC
  Administered 2021-08-12: 20 mg via ORAL
  Filled 2021-08-12: qty 5

## 2021-08-12 MED ORDER — PROCHLORPERAZINE MALEATE 10 MG PO TABS
10.0000 mg | ORAL_TABLET | Freq: Once | ORAL | Status: AC
  Administered 2021-08-12: 10 mg via ORAL
  Filled 2021-08-12: qty 1

## 2021-08-12 MED ORDER — BORTEZOMIB CHEMO SQ INJECTION 3.5 MG (2.5MG/ML)
1.3000 mg/m2 | Freq: Once | INTRAMUSCULAR | Status: AC
  Administered 2021-08-12: 2.25 mg via SUBCUTANEOUS
  Filled 2021-08-12: qty 0.9

## 2021-08-12 NOTE — Patient Instructions (Signed)
[facility]  Discharge Instructions: Thank you for choosing [facility] to provide your oncology and hematology care.  If you have a lab appointment with the Cancer Center, please come in thru the Main Entrance and check in at the main information desk.  Wear comfortable clothing and clothing appropriate for easy access to any Portacath or PICC line.   We strive to give you quality time with your provider. You may need to reschedule your appointment if you arrive late (15 or more minutes).  Arriving late affects you and other patients whose appointments are after yours.  Also, if you miss three or more appointments without notifying the office, you may be dismissed from the clinic at the provider's discretion.      For prescription refill requests, have your pharmacy contact our office and allow 72 hours for refills to be completed.        To help prevent nausea and vomiting after your treatment, we encourage you to take your nausea medication as directed.  BELOW ARE SYMPTOMS THAT SHOULD BE REPORTED IMMEDIATELY: *FEVER GREATER THAN 100.4 F (38 C) OR HIGHER *CHILLS OR SWEATING *NAUSEA AND VOMITING THAT IS NOT CONTROLLED WITH YOUR NAUSEA MEDICATION *UNUSUAL SHORTNESS OF BREATH *UNUSUAL BRUISING OR BLEEDING *URINARY PROBLEMS (pain or burning when urinating, or frequent urination) *BOWEL PROBLEMS (unusual diarrhea, constipation, pain near the anus) TENDERNESS IN MOUTH AND THROAT WITH OR WITHOUT PRESENCE OF ULCERS (sore throat, sores in mouth, or a toothache) UNUSUAL RASH, SWELLING OR PAIN  UNUSUAL VAGINAL DISCHARGE OR ITCHING   Items with * indicate a potential emergency and should be followed up as soon as possible or go to the Emergency Department if any problems should occur.  Please show the CHEMOTHERAPY ALERT CARD or IMMUNOTHERAPY ALERT CARD at check-in to the Emergency Department and triage nurse.  Should you have questions after your visit or need to cancel  or reschedule your appointment, please contact [facility] [phone]  and follow the prompts.  Office hours are 8:00 a.m. to 4:30 p.m. Monday - Friday. Please note that voicemails left after 4:00 p.m. may not be returned until the following business day.  We are closed weekends and major holidays. You have access to a nurse at all times for urgent questions. Please call the main number to the clinic [phone] and follow the prompts.  For any non-urgent questions, you may also contact your provider using MyChart. We now offer e-Visits for anyone 18 and older to request care online for non-urgent symptoms. For details visit [email or web address].   Also download the MyChart app! Go to the app store, search "MyChart", open the app, select [facility], and log in with your MyChart username and password.  Due to Covid, a mask is required upon entering the hospital/clinic. If you do not have a mask, one will be given to you upon arrival. For doctor visits, patients may have 1 support person aged 18 or older with them. For treatment visits, patients cannot have anyone with them due to current Covid guidelines and our immunocompromised population.  

## 2021-08-12 NOTE — Progress Notes (Signed)
Patient presents today for Velcade and Retacrit injections.  Labs are within parameters for Velcade.  Hemoglobin is 10.7 today so we will hold Retacrit injection per orders.  Patient is in satisfactory condition and vital signs are stable.    Patient tolerated Velcade injection with no complaints voiced.  Site clean and dry with no bruising or swelling noted.  No complaints of pain.  Discharged ambulatory with vital signs stable and no signs or symptoms of distress noted.

## 2021-08-18 ENCOUNTER — Other Ambulatory Visit (HOSPITAL_COMMUNITY): Payer: Self-pay

## 2021-08-18 DIAGNOSIS — C9000 Multiple myeloma not having achieved remission: Secondary | ICD-10-CM

## 2021-08-18 MED ORDER — LENALIDOMIDE 20 MG PO CAPS: ORAL_CAPSULE | ORAL | 0 refills | Status: DC

## 2021-08-18 NOTE — Telephone Encounter (Signed)
Chart reviewed. Revlimid refilled per last office note with Dr. Katragadda.  

## 2021-08-19 ENCOUNTER — Inpatient Hospital Stay (HOSPITAL_COMMUNITY): Payer: Medicare Other

## 2021-08-19 ENCOUNTER — Other Ambulatory Visit: Payer: Self-pay

## 2021-08-19 ENCOUNTER — Encounter (HOSPITAL_COMMUNITY): Payer: Self-pay

## 2021-08-19 VITALS — BP 129/50 | HR 58 | Temp 97.8°F | Resp 18 | Wt 166.4 lb

## 2021-08-19 DIAGNOSIS — C9000 Multiple myeloma not having achieved remission: Secondary | ICD-10-CM

## 2021-08-19 DIAGNOSIS — Z5112 Encounter for antineoplastic immunotherapy: Secondary | ICD-10-CM | POA: Diagnosis not present

## 2021-08-19 DIAGNOSIS — E538 Deficiency of other specified B group vitamins: Secondary | ICD-10-CM

## 2021-08-19 LAB — CBC WITH DIFFERENTIAL/PLATELET
Abs Immature Granulocytes: 0.01 10*3/uL (ref 0.00–0.07)
Basophils Absolute: 0 10*3/uL (ref 0.0–0.1)
Basophils Relative: 1 %
Eosinophils Absolute: 0.1 10*3/uL (ref 0.0–0.5)
Eosinophils Relative: 2 %
HCT: 30.2 % — ABNORMAL LOW (ref 36.0–46.0)
Hemoglobin: 10 g/dL — ABNORMAL LOW (ref 12.0–15.0)
Immature Granulocytes: 0 %
Lymphocytes Relative: 29 %
Lymphs Abs: 0.9 10*3/uL (ref 0.7–4.0)
MCH: 35 pg — ABNORMAL HIGH (ref 26.0–34.0)
MCHC: 33.1 g/dL (ref 30.0–36.0)
MCV: 105.6 fL — ABNORMAL HIGH (ref 80.0–100.0)
Monocytes Absolute: 0.3 10*3/uL (ref 0.1–1.0)
Monocytes Relative: 9 %
Neutro Abs: 1.9 10*3/uL (ref 1.7–7.7)
Neutrophils Relative %: 59 %
Platelets: 122 10*3/uL — ABNORMAL LOW (ref 150–400)
RBC: 2.86 MIL/uL — ABNORMAL LOW (ref 3.87–5.11)
RDW: 16.1 % — ABNORMAL HIGH (ref 11.5–15.5)
WBC: 3.2 10*3/uL — ABNORMAL LOW (ref 4.0–10.5)
nRBC: 0 % (ref 0.0–0.2)

## 2021-08-19 LAB — COMPREHENSIVE METABOLIC PANEL
ALT: 9 U/L (ref 0–44)
AST: 13 U/L — ABNORMAL LOW (ref 15–41)
Albumin: 3.6 g/dL (ref 3.5–5.0)
Alkaline Phosphatase: 73 U/L (ref 38–126)
Anion gap: 11 (ref 5–15)
BUN: 21 mg/dL (ref 8–23)
CO2: 27 mmol/L (ref 22–32)
Calcium: 9.3 mg/dL (ref 8.9–10.3)
Chloride: 100 mmol/L (ref 98–111)
Creatinine, Ser: 1.18 mg/dL — ABNORMAL HIGH (ref 0.44–1.00)
GFR, Estimated: 46 mL/min — ABNORMAL LOW (ref >60)
Glucose, Bld: 182 mg/dL — ABNORMAL HIGH (ref 70–99)
Potassium: 3.1 mmol/L — ABNORMAL LOW (ref 3.5–5.1)
Sodium: 138 mmol/L (ref 135–145)
Total Bilirubin: 1.1 mg/dL (ref 0.3–1.2)
Total Protein: 6.3 g/dL — ABNORMAL LOW (ref 6.5–8.1)

## 2021-08-19 MED ORDER — BORTEZOMIB CHEMO SQ INJECTION 3.5 MG (2.5MG/ML)
1.3000 mg/m2 | Freq: Once | INTRAMUSCULAR | Status: AC
  Administered 2021-08-19: 2.25 mg via SUBCUTANEOUS
  Filled 2021-08-19: qty 0.9

## 2021-08-19 MED ORDER — DEXAMETHASONE 4 MG PO TABS
20.0000 mg | ORAL_TABLET | Freq: Once | ORAL | Status: AC
  Administered 2021-08-19: 20 mg via ORAL
  Filled 2021-08-19: qty 5

## 2021-08-19 MED ORDER — PROCHLORPERAZINE MALEATE 10 MG PO TABS
10.0000 mg | ORAL_TABLET | Freq: Once | ORAL | Status: AC
  Administered 2021-08-19: 10 mg via ORAL
  Filled 2021-08-19: qty 1

## 2021-08-19 MED ORDER — CYANOCOBALAMIN 1000 MCG/ML IJ SOLN
1000.0000 ug | Freq: Once | INTRAMUSCULAR | Status: AC
  Administered 2021-08-19: 1000 ug via INTRAMUSCULAR
  Filled 2021-08-19: qty 1

## 2021-08-19 MED ORDER — POTASSIUM CHLORIDE CRYS ER 20 MEQ PO TBCR
40.0000 meq | EXTENDED_RELEASE_TABLET | Freq: Once | ORAL | Status: AC
  Administered 2021-08-19: 40 meq via ORAL
  Filled 2021-08-19: qty 2

## 2021-08-19 NOTE — Progress Notes (Signed)
Hemoglobin is 10.0. Potassium is 3.1. will give additional Potassium per orders. Will hold Reticrit today per plan. Labs reviewed. Ok to proceed with velcade today per MD. No new complaints from patient today.   Treatment given per orders. Patient tolerated it well without problems. Vitals stable and discharged home from clinic ambulatory. Follow up as scheduled.

## 2021-08-19 NOTE — Patient Instructions (Signed)
[facility]  Discharge Instructions: Thank you for choosing [facility] to provide your oncology and hematology care.  If you have a lab appointment with the Cancer Center, please come in thru the Main Entrance and check in at the main information desk.  Wear comfortable clothing and clothing appropriate for easy access to any Portacath or PICC line.   We strive to give you quality time with your provider. You may need to reschedule your appointment if you arrive late (15 or more minutes).  Arriving late affects you and other patients whose appointments are after yours.  Also, if you miss three or more appointments without notifying the office, you may be dismissed from the clinic at the provider's discretion.      For prescription refill requests, have your pharmacy contact our office and allow 72 hours for refills to be completed.        To help prevent nausea and vomiting after your treatment, we encourage you to take your nausea medication as directed.  BELOW ARE SYMPTOMS THAT SHOULD BE REPORTED IMMEDIATELY: *FEVER GREATER THAN 100.4 F (38 C) OR HIGHER *CHILLS OR SWEATING *NAUSEA AND VOMITING THAT IS NOT CONTROLLED WITH YOUR NAUSEA MEDICATION *UNUSUAL SHORTNESS OF BREATH *UNUSUAL BRUISING OR BLEEDING *URINARY PROBLEMS (pain or burning when urinating, or frequent urination) *BOWEL PROBLEMS (unusual diarrhea, constipation, pain near the anus) TENDERNESS IN MOUTH AND THROAT WITH OR WITHOUT PRESENCE OF ULCERS (sore throat, sores in mouth, or a toothache) UNUSUAL RASH, SWELLING OR PAIN  UNUSUAL VAGINAL DISCHARGE OR ITCHING   Items with * indicate a potential emergency and should be followed up as soon as possible or go to the Emergency Department if any problems should occur.  Please show the CHEMOTHERAPY ALERT CARD or IMMUNOTHERAPY ALERT CARD at check-in to the Emergency Department and triage nurse.  Should you have questions after your visit or need to cancel  or reschedule your appointment, please contact [facility] [phone]  and follow the prompts.  Office hours are 8:00 a.m. to 4:30 p.m. Monday - Friday. Please note that voicemails left after 4:00 p.m. may not be returned until the following business day.  We are closed weekends and major holidays. You have access to a nurse at all times for urgent questions. Please call the main number to the clinic [phone] and follow the prompts.  For any non-urgent questions, you may also contact your provider using MyChart. We now offer e-Visits for anyone 18 and older to request care online for non-urgent symptoms. For details visit [email or web address].   Also download the MyChart app! Go to the app store, search "MyChart", open the app, select [facility], and log in with your MyChart username and password.  Due to Covid, a mask is required upon entering the hospital/clinic. If you do not have a mask, one will be given to you upon arrival. For doctor visits, patients may have 1 support person aged 18 or older with them. For treatment visits, patients cannot have anyone with them due to current Covid guidelines and our immunocompromised population.  

## 2021-09-02 ENCOUNTER — Inpatient Hospital Stay (HOSPITAL_COMMUNITY): Payer: Medicare Other | Attending: Hematology

## 2021-09-02 ENCOUNTER — Other Ambulatory Visit: Payer: Self-pay

## 2021-09-02 ENCOUNTER — Inpatient Hospital Stay (HOSPITAL_COMMUNITY): Payer: Medicare Other

## 2021-09-02 VITALS — BP 123/53 | HR 63 | Temp 96.7°F | Resp 18 | Wt 169.2 lb

## 2021-09-02 DIAGNOSIS — D631 Anemia in chronic kidney disease: Secondary | ICD-10-CM | POA: Insufficient documentation

## 2021-09-02 DIAGNOSIS — N189 Chronic kidney disease, unspecified: Secondary | ICD-10-CM | POA: Diagnosis not present

## 2021-09-02 DIAGNOSIS — E538 Deficiency of other specified B group vitamins: Secondary | ICD-10-CM | POA: Insufficient documentation

## 2021-09-02 DIAGNOSIS — Z5112 Encounter for antineoplastic immunotherapy: Secondary | ICD-10-CM | POA: Insufficient documentation

## 2021-09-02 DIAGNOSIS — C9000 Multiple myeloma not having achieved remission: Secondary | ICD-10-CM

## 2021-09-02 LAB — CBC WITH DIFFERENTIAL/PLATELET
Abs Immature Granulocytes: 0.04 10*3/uL (ref 0.00–0.07)
Basophils Absolute: 0.1 10*3/uL (ref 0.0–0.1)
Basophils Relative: 2 %
Eosinophils Absolute: 0.1 10*3/uL (ref 0.0–0.5)
Eosinophils Relative: 4 %
HCT: 30.5 % — ABNORMAL LOW (ref 36.0–46.0)
Hemoglobin: 10 g/dL — ABNORMAL LOW (ref 12.0–15.0)
Immature Granulocytes: 1 %
Lymphocytes Relative: 30 %
Lymphs Abs: 1 10*3/uL (ref 0.7–4.0)
MCH: 34.5 pg — ABNORMAL HIGH (ref 26.0–34.0)
MCHC: 32.8 g/dL (ref 30.0–36.0)
MCV: 105.2 fL — ABNORMAL HIGH (ref 80.0–100.0)
Monocytes Absolute: 0.4 10*3/uL (ref 0.1–1.0)
Monocytes Relative: 11 %
Neutro Abs: 1.8 10*3/uL (ref 1.7–7.7)
Neutrophils Relative %: 52 %
Platelets: 184 10*3/uL (ref 150–400)
RBC: 2.9 MIL/uL — ABNORMAL LOW (ref 3.87–5.11)
RDW: 15.7 % — ABNORMAL HIGH (ref 11.5–15.5)
WBC: 3.3 10*3/uL — ABNORMAL LOW (ref 4.0–10.5)
nRBC: 0 % (ref 0.0–0.2)

## 2021-09-02 LAB — COMPREHENSIVE METABOLIC PANEL
ALT: 8 U/L (ref 0–44)
AST: 12 U/L — ABNORMAL LOW (ref 15–41)
Albumin: 3.5 g/dL (ref 3.5–5.0)
Alkaline Phosphatase: 87 U/L (ref 38–126)
Anion gap: 7 (ref 5–15)
BUN: 26 mg/dL — ABNORMAL HIGH (ref 8–23)
CO2: 24 mmol/L (ref 22–32)
Calcium: 9.2 mg/dL (ref 8.9–10.3)
Chloride: 109 mmol/L (ref 98–111)
Creatinine, Ser: 1.06 mg/dL — ABNORMAL HIGH (ref 0.44–1.00)
GFR, Estimated: 53 mL/min — ABNORMAL LOW (ref >60)
Glucose, Bld: 152 mg/dL — ABNORMAL HIGH (ref 70–99)
Potassium: 2.9 mmol/L — ABNORMAL LOW (ref 3.5–5.1)
Sodium: 140 mmol/L (ref 135–145)
Total Bilirubin: 0.6 mg/dL (ref 0.3–1.2)
Total Protein: 6.3 g/dL — ABNORMAL LOW (ref 6.5–8.1)

## 2021-09-02 MED ORDER — BORTEZOMIB CHEMO SQ INJECTION 3.5 MG (2.5MG/ML)
1.3000 mg/m2 | Freq: Once | INTRAMUSCULAR | Status: AC
  Administered 2021-09-02: 2.25 mg via SUBCUTANEOUS
  Filled 2021-09-02: qty 0.9

## 2021-09-02 MED ORDER — PROCHLORPERAZINE MALEATE 10 MG PO TABS
10.0000 mg | ORAL_TABLET | Freq: Once | ORAL | Status: AC
  Administered 2021-09-02: 10 mg via ORAL
  Filled 2021-09-02: qty 1

## 2021-09-02 MED ORDER — EPOETIN ALFA-EPBX 20000 UNIT/ML IJ SOLN: 20000.0000 [IU] | Freq: Once | INTRAMUSCULAR | Status: DC

## 2021-09-02 MED ORDER — DEXAMETHASONE 4 MG PO TABS
20.0000 mg | ORAL_TABLET | Freq: Once | ORAL | Status: AC
  Administered 2021-09-02: 20 mg via ORAL
  Filled 2021-09-02: qty 5

## 2021-09-02 MED ORDER — POTASSIUM CHLORIDE CRYS ER 20 MEQ PO TBCR
40.0000 meq | EXTENDED_RELEASE_TABLET | Freq: Once | ORAL | Status: AC
  Administered 2021-09-02: 40 meq via ORAL
  Filled 2021-09-02: qty 2

## 2021-09-02 NOTE — Patient Instructions (Signed)
[facility]  Discharge Instructions: Thank you for choosing [facility] to provide your oncology and hematology care.  If you have a lab appointment with the Cancer Center, please come in thru the Main Entrance and check in at the main information desk.  Wear comfortable clothing and clothing appropriate for easy access to any Portacath or PICC line.   We strive to give you quality time with your provider. You may need to reschedule your appointment if you arrive late (15 or more minutes).  Arriving late affects you and other patients whose appointments are after yours.  Also, if you miss three or more appointments without notifying the office, you may be dismissed from the clinic at the provider's discretion.      For prescription refill requests, have your pharmacy contact our office and allow 72 hours for refills to be completed.    Today you received the following chemotherapy and/or immunotherapy agents: Cisplatin   To help prevent nausea and vomiting after your treatment, we encourage you to take your nausea medication as directed.  BELOW ARE SYMPTOMS THAT SHOULD BE REPORTED IMMEDIATELY: *FEVER GREATER THAN 100.4 F (38 C) OR HIGHER *CHILLS OR SWEATING *NAUSEA AND VOMITING THAT IS NOT CONTROLLED WITH YOUR NAUSEA MEDICATION *UNUSUAL SHORTNESS OF BREATH *UNUSUAL BRUISING OR BLEEDING *URINARY PROBLEMS (pain or burning when urinating, or frequent urination) *BOWEL PROBLEMS (unusual diarrhea, constipation, pain near the anus) TENDERNESS IN MOUTH AND THROAT WITH OR WITHOUT PRESENCE OF ULCERS (sore throat, sores in mouth, or a toothache) UNUSUAL RASH, SWELLING OR PAIN  UNUSUAL VAGINAL DISCHARGE OR ITCHING   Items with * indicate a potential emergency and should be followed up as soon as possible or go to the Emergency Department if any problems should occur.  Please show the CHEMOTHERAPY ALERT CARD or IMMUNOTHERAPY ALERT CARD at check-in to the Emergency  Department and triage nurse.  Should you have questions after your visit or need to cancel or reschedule your appointment, please contact [facility] [phone]  and follow the prompts.  Office hours are 8:00 a.m. to 4:30 p.m. Monday - Friday. Please note that voicemails left after 4:00 p.m. may not be returned until the following business day.  We are closed weekends and major holidays. You have access to a nurse at all times for urgent questions. Please call the main number to the clinic [phone] and follow the prompts.  For any non-urgent questions, you may also contact your provider using MyChart. We now offer e-Visits for anyone 18 and older to request care online for non-urgent symptoms. For details visit [email or web address].   Also download the MyChart app! Go to the app store, search "MyChart", open the app, select [facility], and log in with your MyChart username and password.  Due to Covid, a mask is required upon entering the hospital/clinic. If you do not have a mask, one will be given to you upon arrival. For doctor visits, patients may have 1 support person aged 18 or older with them. For treatment visits, patients cannot have anyone with them due to current Covid guidelines and our immunocompromised population.  

## 2021-09-02 NOTE — Progress Notes (Signed)
Patient presents today for Velcade and Retacrit injections per providers order.  Vital signs within parameters for treatment.  Labs pending.  Patient has no new complaints at this time.  Hgb noted to be 10.  Retacrit will be held per parameters and pharmacy.  Velcade administration without incident; injection site WNL; see MAR for injection details.  Patient tolerated procedure well and without incident.  No questions or complaints noted at this time.  Discharge from clinic ambulatory in stable condition.  Alert and oriented X 3.  Follow up with [facility] as scheduled.

## 2021-09-09 ENCOUNTER — Other Ambulatory Visit: Payer: Self-pay

## 2021-09-09 ENCOUNTER — Inpatient Hospital Stay (HOSPITAL_COMMUNITY): Payer: Medicare Other

## 2021-09-09 VITALS — BP 124/51 | HR 65 | Temp 96.9°F | Resp 18

## 2021-09-09 DIAGNOSIS — C9000 Multiple myeloma not having achieved remission: Secondary | ICD-10-CM

## 2021-09-09 DIAGNOSIS — Z5112 Encounter for antineoplastic immunotherapy: Secondary | ICD-10-CM | POA: Diagnosis not present

## 2021-09-09 LAB — IRON AND TIBC
Iron: 69 ug/dL (ref 28–170)
Saturation Ratios: 41 % — ABNORMAL HIGH (ref 10.4–31.8)
TIBC: 168 ug/dL — ABNORMAL LOW (ref 250–450)
UIBC: 99 ug/dL

## 2021-09-09 LAB — CBC WITH DIFFERENTIAL/PLATELET
Abs Immature Granulocytes: 0.01 10*3/uL (ref 0.00–0.07)
Basophils Absolute: 0 10*3/uL (ref 0.0–0.1)
Basophils Relative: 0 %
Eosinophils Absolute: 0.2 10*3/uL (ref 0.0–0.5)
Eosinophils Relative: 5 %
HCT: 31.7 % — ABNORMAL LOW (ref 36.0–46.0)
Hemoglobin: 10.4 g/dL — ABNORMAL LOW (ref 12.0–15.0)
Immature Granulocytes: 0 %
Lymphocytes Relative: 24 %
Lymphs Abs: 0.8 10*3/uL (ref 0.7–4.0)
MCH: 33.9 pg (ref 26.0–34.0)
MCHC: 32.8 g/dL (ref 30.0–36.0)
MCV: 103.3 fL — ABNORMAL HIGH (ref 80.0–100.0)
Monocytes Absolute: 0.3 10*3/uL (ref 0.1–1.0)
Monocytes Relative: 10 %
Neutro Abs: 2 10*3/uL (ref 1.7–7.7)
Neutrophils Relative %: 61 %
Platelets: 155 10*3/uL (ref 150–400)
RBC: 3.07 MIL/uL — ABNORMAL LOW (ref 3.87–5.11)
RDW: 15.8 % — ABNORMAL HIGH (ref 11.5–15.5)
WBC: 3.4 10*3/uL — ABNORMAL LOW (ref 4.0–10.5)
nRBC: 0 % (ref 0.0–0.2)

## 2021-09-09 LAB — COMPREHENSIVE METABOLIC PANEL
ALT: 10 U/L (ref 0–44)
AST: 12 U/L — ABNORMAL LOW (ref 15–41)
Albumin: 3.7 g/dL (ref 3.5–5.0)
Alkaline Phosphatase: 90 U/L (ref 38–126)
Anion gap: 7 (ref 5–15)
BUN: 22 mg/dL (ref 8–23)
CO2: 28 mmol/L (ref 22–32)
Calcium: 9.6 mg/dL (ref 8.9–10.3)
Chloride: 105 mmol/L (ref 98–111)
Creatinine, Ser: 1.23 mg/dL — ABNORMAL HIGH (ref 0.44–1.00)
GFR, Estimated: 44 mL/min — ABNORMAL LOW (ref >60)
Glucose, Bld: 94 mg/dL (ref 70–99)
Potassium: 3.5 mmol/L (ref 3.5–5.1)
Sodium: 140 mmol/L (ref 135–145)
Total Bilirubin: 0.9 mg/dL (ref 0.3–1.2)
Total Protein: 6.4 g/dL — ABNORMAL LOW (ref 6.5–8.1)

## 2021-09-09 LAB — MAGNESIUM: Magnesium: 1.5 mg/dL — ABNORMAL LOW (ref 1.7–2.4)

## 2021-09-09 LAB — FERRITIN: Ferritin: 121 ng/mL (ref 11–307)

## 2021-09-09 LAB — LACTATE DEHYDROGENASE: LDH: 134 U/L (ref 98–192)

## 2021-09-09 MED ORDER — DEXAMETHASONE 4 MG PO TABS
20.0000 mg | ORAL_TABLET | Freq: Once | ORAL | Status: AC
  Administered 2021-09-09: 20 mg via ORAL
  Filled 2021-09-09: qty 5

## 2021-09-09 MED ORDER — PROCHLORPERAZINE MALEATE 10 MG PO TABS
10.0000 mg | ORAL_TABLET | Freq: Once | ORAL | Status: AC
  Administered 2021-09-09: 10 mg via ORAL
  Filled 2021-09-09: qty 1

## 2021-09-09 MED ORDER — BORTEZOMIB CHEMO SQ INJECTION 3.5 MG (2.5MG/ML)
1.3000 mg/m2 | Freq: Once | INTRAMUSCULAR | Status: AC
  Administered 2021-09-09: 2.25 mg via SUBCUTANEOUS
  Filled 2021-09-09: qty 0.9

## 2021-09-09 NOTE — Patient Instructions (Signed)
[facility]  Discharge Instructions: Thank you for choosing [facility] to provide your oncology and hematology care.  If you have a lab appointment with the [facility], please come in thru the Main Entrance and check in at the main information desk.  Wear comfortable clothing and clothing appropriate for easy access to any Portacath or PICC line.   We strive to give you quality time with your provider. You may need to reschedule your appointment if you arrive late (15 or more minutes).  Arriving late affects you and other patients whose appointments are after yours.  Also, if you miss three or more appointments without notifying the office, you may be dismissed from the clinic at the provider's discretion.      For prescription refill requests, have your pharmacy contact our office and allow 72 hours for refills to be completed.    Today you received the following: Velcade injection.      To help prevent nausea and vomiting after your treatment, we encourage you to take your nausea medication as directed.  BELOW ARE SYMPTOMS THAT SHOULD BE REPORTED IMMEDIATELY: *FEVER GREATER THAN 100.4 F (38 C) OR HIGHER *CHILLS OR SWEATING *NAUSEA AND VOMITING THAT IS NOT CONTROLLED WITH YOUR NAUSEA MEDICATION *UNUSUAL SHORTNESS OF BREATH *UNUSUAL BRUISING OR BLEEDING *URINARY PROBLEMS (pain or burning when urinating, or frequent urination) *BOWEL PROBLEMS (unusual diarrhea, constipation, pain near the anus) TENDERNESS IN MOUTH AND THROAT WITH OR WITHOUT PRESENCE OF ULCERS (sore throat, sores in mouth, or a toothache) UNUSUAL RASH, SWELLING OR PAIN  UNUSUAL VAGINAL DISCHARGE OR ITCHING   Items with * indicate a potential emergency and should be followed up as soon as possible or go to the Emergency Department if any problems should occur.  Please show the CHEMOTHERAPY ALERT CARD or IMMUNOTHERAPY ALERT CARD at check-in to the Emergency Department and triage  nurse.  Should you have questions after your visit or need to cancel or reschedule your appointment, please contact [facility] [phone]  and follow the prompts.  Office hours are 8:00 a.m. to 4:30 p.m. Monday - Friday. Please note that voicemails left after 4:00 p.m. may not be returned until the following business day.  We are closed weekends and major holidays. You have access to a nurse at all times for urgent questions. Please call the main number to the clinic [phone] and follow the prompts.  For any non-urgent questions, you may also contact your provider using MyChart. We now offer e-Visits for anyone 62 and older to request care online for non-urgent symptoms. For details visit [email or web address].   Also download the MyChart app! Go to the app store, search "MyChart", open the app, select [facility], and log in with your MyChart username and password.  Due to Covid, a mask is required upon entering the hospital/clinic. If you do not have a mask, one will be given to you upon arrival. For doctor visits, patients may have 1 support person aged 78 or older with them. For treatment visits, patients cannot have anyone with them due to current Covid guidelines and our immunocompromised population.

## 2021-09-09 NOTE — Progress Notes (Signed)
Treatment given today per MD orders. Tolerated without adverse affects. Vital signs stable. No complaints at this time. Discharged from clinic ambulatory in stable condition. Alert and oriented x 3. F/U with [facility] as scheduled.   

## 2021-09-09 NOTE — Progress Notes (Signed)
Patient presents today for Velcade and Retacrit per providers order.  Vital signs within parameters.  Labs reviewed and Retacrit will be held today for Hgb of 10.4.  Patient has no new complaints at this time.

## 2021-09-10 ENCOUNTER — Other Ambulatory Visit (HOSPITAL_COMMUNITY): Payer: Self-pay

## 2021-09-10 DIAGNOSIS — C9000 Multiple myeloma not having achieved remission: Secondary | ICD-10-CM

## 2021-09-10 LAB — KAPPA/LAMBDA LIGHT CHAINS
Kappa free light chain: 35.4 mg/L — ABNORMAL HIGH (ref 3.3–19.4)
Kappa, lambda light chain ratio: 2.39 — ABNORMAL HIGH (ref 0.26–1.65)
Lambda free light chains: 14.8 mg/L (ref 5.7–26.3)

## 2021-09-10 MED ORDER — LENALIDOMIDE 20 MG PO CAPS: ORAL_CAPSULE | ORAL | 0 refills | Status: DC

## 2021-09-10 NOTE — Telephone Encounter (Signed)
Chart reviewed. Revlimid refilled per last office note with Dr. Katragadda.  

## 2021-09-15 NOTE — Progress Notes (Signed)
[facility] [address] [place] [address]   CLINIC:  Medical Oncology/Hematology  PCP:  [name], MD [address] / [place] [address] [phone]   REASON FOR VISIT:  Follow-up for multiple myeloma  PRIOR THERAPY: none  NGS Results: not done  CURRENT THERAPY: Velcade 3/4 weeks; Revlimid 20 mg 2/3 weeks  BRIEF ONCOLOGIC HISTORY:  Oncology History  Multiple myeloma not having achieved remission ([place])  01/20/2018 Initial Diagnosis   Multiple myeloma not having achieved remission ([place])   01/26/2018 -  Chemotherapy    Patient is on Treatment Plan: MYELOMA  RVD SQ (BORTEZOMIB D 1,8,15 ) Q28D X 4 CYCLES         CANCER STAGING: Cancer Staging No matching staging information was found for the patient.  INTERVAL HISTORY:  Brittany Archer, a 80 y.o. female, returns for routine follow-up and consideration for next cycle of chemotherapy. Brittany Archer was last seen on 06/26/2021.  Due for day #15 cycle #46 of Velcade today.   Overall, she tells me she has been feeling pretty well. She denies numbness, tingling, and n/v/d.   Overall, she feels ready for next cycle of chemo today.   REVIEW OF SYSTEMS:  Review of Systems  Constitutional:  Negative for appetite change and fatigue (75%).  Gastrointestinal:  Negative for diarrhea, nausea and vomiting.  Neurological:  Negative for numbness.  All other systems reviewed and are negative.  PAST MEDICAL/SURGICAL HISTORY:  Past Medical History:  Diagnosis Date   Breast cancer ([place])    left breast/ 2008/ surg/ rad tx   Coronary artery disease    Diabetes mellitus    Past Surgical History:  Procedure Laterality Date   ABDOMINAL HYSTERECTOMY     BREAST SURGERY     DEBRIDEMENT MANDIBLE N/A 02/22/2020   Procedure: INCISION AND DRAINAGE WITH DEBRIDEMENT MANDIBLE;  Surgeon: Michael Litter, DMD;  Location: [facility];  Service: Oral Surgery;  Laterality: N/A;   DEBRIDEMENT MANDIBLE Right  03/19/2021   Procedure: DEBRIDEMENT OF BONE RIGHT INTERIOR  MANDIBLE;  Surgeon: Michael Litter, DMD;  Location: [facility];  Service: Oral Surgery;  Laterality: Right;   EYE SURGERY  2021   cataract removals    TOOTH EXTRACTION N/A 02/22/2020   Procedure: DENTAL RESTORATION/EXTRACTIONS;  Surgeon: Michael Litter, DMD;  Location: [facility];  Service: Oral Surgery;  Laterality: N/A;  DENTAL KIT REQUESTED    SOCIAL HISTORY:  Social History   Socioeconomic History   Marital status: Divorced    Spouse name: Not on file   Number of children: Not on file   Years of education: Not on file   Highest education level: Not on file  Occupational History   Not on file  Tobacco Use   Smoking status: Never   Smokeless tobacco: Never  Vaping Use   Vaping Use: Never used  Substance and Sexual Activity   Alcohol use: No   Drug use: No   Sexual activity: Yes    Birth control/protection: Surgical  Other Topics Concern   Not on file  Social History Narrative   Not on file   Social Determinants of Health   Financial Resource Strain: Low Risk    Difficulty of Paying Living Expenses: Not hard at all  Food Insecurity: No Food Insecurity   Worried About Charity fundraiser in the Last Year: Never true   [place] in the Last Year: Never true  Transportation Needs: No Transportation Needs  Lack of Transportation (Medical): No   Lack of Transportation (Non-Medical): No  Physical Activity: Inactive   Days of Exercise per Week: 0 days   Minutes of Exercise per Session: 0 min  Stress: No Stress Concern Present   Feeling of Stress : Not at all  Social Connections: Moderately Isolated   Frequency of Communication with Friends and Family: More than three times a week   Frequency of Social Gatherings with Friends and Family: More than three times a week   Attends Religious Services: More than 4 times per year   Active Member of [employer] or Organizations: No   Attends Archivist Meetings: Never    Marital Status: Divorced  Human resources officer Violence: Not At Risk   Fear of Current or Ex-Partner: No   Emotionally Abused: No   Physically Abused: No   Sexually Abused: No    FAMILY HISTORY:  Family History  Problem Relation Age of Onset   Obesity Sister     CURRENT MEDICATIONS:  Current Outpatient Medications  Medication Sig Dispense Refill   acyclovir (ZOVIRAX) 400 MG tablet TAKE 1 TABLET BY MOUTH TWICE DAILY (Patient taking differently: Take 400 mg by mouth 2 (two) times daily.) 60 tablet 11   aspirin 81 MG tablet Take 81 mg by mouth daily.     bortezomib IV (VELCADE) 3.5 MG injection Inject 3.5 mg into the vein once a week. weekly     chlorhexidine (PERIDEX) 0.12 % solution Use as directed 15 mLs in the mouth or throat 3 (three) times daily.     dexamethasone (DECADRON) 4 MG tablet Take 10 tablets (40 mg) on days 1, 8, and 15 of chemo. Repeat every 21 days. (Patient taking differently: Take 4 mg by mouth See admin instructions. Take 10 tablets (40 mg) on days 1, 8, and 15 of chemo. Repeat every 21 days.) 30 tablet 3   glipiZIDE (GLUCOTROL) 5 MG tablet Take 5 mg by mouth daily before breakfast.      glucose blood (ACCU-CHEK AVIVA PLUS) test strip CHECK BLOOD SUGAR ONCE DAILY     lenalidomide (REVLIMID) 20 MG capsule Take 20 mg capsule by mouth once daily for 14 days on, and 7 days off of a 21 day cycle. 14 capsule 0   lisinopril-hydrochlorothiazide (PRINZIDE,ZESTORETIC) 20-25 MG tablet Take 1 tablet by mouth daily.     magnesium oxide (MAG-OX) 400 (241.3 Mg) MG tablet Take 1 tablet (400 mg total) by mouth in the morning, at noon, and at bedtime. 90 tablet 6   metFORMIN (GLUCOPHAGE) 1000 MG tablet Take 1,000 mg by mouth 2 (two) times daily with a meal.     potassium chloride 20 MEQ/15ML (10%) SOLN Take 6 mEq by mouth 3 (three) times daily. 5 ml     potassium chloride SA (KLOR-CON) 20 MEQ tablet TAKE (2) TABLETS BY MOUTH THREE TIMES DAILY. 168 tablet 3   No current  facility-administered medications for this visit.    ALLERGIES:  Allergies  Allergen Reactions   Seasonal Ic [Cholestatin] Other (See Comments)    Sneezing, watery eyes   Motrin [Ibuprofen] Rash    PHYSICAL EXAM:  Performance status (ECOG): 1 - Symptomatic but completely ambulatory  There were no vitals filed for this visit. Wt Readings from Last 3 Encounters:  09/02/21 169 lb 3.2 oz (76.7 kg)  08/19/21 166 lb 6.4 oz (75.5 kg)  08/12/21 168 lb 3.2 oz (76.3 kg)   Physical Exam  LABORATORY DATA:  I have reviewed the labs as  listed.  CBC Latest Ref Rng & Units 09/09/2021 09/02/2021 08/19/2021  WBC 4.0 - 10.5 K/uL 3.4(L) 3.3(L) 3.2(L)  Hemoglobin 12.0 - 15.0 g/dL 10.4(L) 10.0(L) 10.0(L)  Hematocrit 36.0 - 46.0 % 31.7(L) 30.5(L) 30.2(L)  Platelets 150 - 400 K/uL 155 184 122(L)   CMP Latest Ref Rng & Units 09/09/2021 09/02/2021 08/19/2021  Glucose 70 - 99 mg/dL 94 152(H) 182(H)  BUN 8 - 23 mg/dL 22 26(H) 21  Creatinine 0.44 - 1.00 mg/dL 1.23(H) 1.06(H) 1.18(H)  Sodium 135 - 145 mmol/L 140 140 138  Potassium 3.5 - 5.1 mmol/L 3.5 2.9(L) 3.1(L)  Chloride 98 - 111 mmol/L 105 109 100  CO2 22 - 32 mmol/L '28 24 27  ' Calcium 8.9 - 10.3 mg/dL 9.6 9.2 9.3  Total Protein 6.5 - 8.1 g/dL 6.4(L) 6.3(L) 6.3(L)  Total Bilirubin 0.3 - 1.2 mg/dL 0.9 0.6 1.1  Alkaline Phos 38 - 126 U/L 90 87 73  AST 15 - 41 U/L 12(L) 12(L) 13(L)  ALT 0 - 44 U/L '10 8 9    ' DIAGNOSTIC IMAGING:  I have independently reviewed the scans and discussed with the patient. No results found.   ASSESSMENT:  1.  IgA kappa plasma cell myeloma, stage I: -RVD started on 01/09/2018, held since 02/14/2020 due to mandible abscess. -Myeloma labs on 05/14/2020 showed progression with M spike of 0.5 g. -RVD started back on 05/21/2020. -Myeloma labs on 07/10/2020 shows M spike improved to 0.3 g from 0.6 g previously.  Free light chain ratio is 1.74 with kappa light chains 28.4. -Myeloma panel from 08/14/2020 shows M spike 0.4 g.  Kappa  light chains are 24.8 and ratio is 2.23.   2.  Osteomyelitis of the right mandible/dental abscess: -Finished IV ceftriaxone on 04/03/2020.  Finished oral antibiotics. -We will hold Xgeva indefinitely.   PLAN:  1.  IgA kappa plasma cell myeloma, stage I: - We have reviewed myeloma panel from 09/09/2021.  M spike is negative.  Free light chains are 2.39 with kappa light chains 35.4, more or less stable. - Continue Revlimid 20 mg 2 weeks on/1 week off.  Continue Velcade 3 weeks on/1 week off.  We have cut back on dexamethasone dose to 20 mg on 07/22/2021. - If there is any worsening of myeloma labs, will consider increasing dexamethasone dose. - Repeat myeloma labs in 5 weeks and RTC 6 weeks.   2.  Severe hypokalemia: - She reports taking potassium 3 times daily.  Potassium today is 3.0. - Give K-Dur 40 mEq p.o. today.   3.  Osteomyelitis of the right mandible/dental abscess: - Bisphosphonates were held indefinitely because of bone exposure in the right lower jaw.   4.  Hypomagnesemia: - Continue magnesium 3 times daily.  Magnesium today is 1.5.  Will repeat in the office.   5.  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  Hemoglobin today is 9.7 with MCV 103. - Ferritin is 121 and percent saturation is 41. - Recommend Feraheme x1.  Continue Retacrit as needed.   Orders placed this encounter:  No orders of the defined types were placed in this encounter.    Derek Jack, MD [facility] [phone]   I, Thana Ates, am acting as a scribe for Dr. Derek Jack.  I, Derek Jack MD, have reviewed the above documentation for accuracy and completeness, and I agree with the above.

## 2021-09-16 ENCOUNTER — Other Ambulatory Visit: Payer: Self-pay

## 2021-09-16 ENCOUNTER — Inpatient Hospital Stay (HOSPITAL_COMMUNITY): Payer: Medicare Other

## 2021-09-16 ENCOUNTER — Inpatient Hospital Stay (HOSPITAL_BASED_OUTPATIENT_CLINIC_OR_DEPARTMENT_OTHER): Payer: Medicare Other | Admitting: Hematology

## 2021-09-16 VITALS — BP 127/65 | HR 62 | Temp 96.9°F | Resp 17 | Wt 166.4 lb

## 2021-09-16 VITALS — Ht 63.0 in

## 2021-09-16 DIAGNOSIS — C9000 Multiple myeloma not having achieved remission: Secondary | ICD-10-CM

## 2021-09-16 DIAGNOSIS — D649 Anemia, unspecified: Secondary | ICD-10-CM

## 2021-09-16 DIAGNOSIS — E538 Deficiency of other specified B group vitamins: Secondary | ICD-10-CM

## 2021-09-16 DIAGNOSIS — E876 Hypokalemia: Secondary | ICD-10-CM | POA: Diagnosis not present

## 2021-09-16 DIAGNOSIS — Z5112 Encounter for antineoplastic immunotherapy: Secondary | ICD-10-CM | POA: Diagnosis not present

## 2021-09-16 LAB — CBC WITH DIFFERENTIAL/PLATELET
Abs Immature Granulocytes: 0.02 10*3/uL (ref 0.00–0.07)
Basophils Absolute: 0 10*3/uL (ref 0.0–0.1)
Basophils Relative: 1 %
Eosinophils Absolute: 0.1 10*3/uL (ref 0.0–0.5)
Eosinophils Relative: 2 %
HCT: 29.4 % — ABNORMAL LOW (ref 36.0–46.0)
Hemoglobin: 9.7 g/dL — ABNORMAL LOW (ref 12.0–15.0)
Immature Granulocytes: 1 %
Lymphocytes Relative: 24 %
Lymphs Abs: 0.7 10*3/uL (ref 0.7–4.0)
MCH: 34.3 pg — ABNORMAL HIGH (ref 26.0–34.0)
MCHC: 33 g/dL (ref 30.0–36.0)
MCV: 103.9 fL — ABNORMAL HIGH (ref 80.0–100.0)
Monocytes Absolute: 0.3 10*3/uL (ref 0.1–1.0)
Monocytes Relative: 9 %
Neutro Abs: 1.9 10*3/uL (ref 1.7–7.7)
Neutrophils Relative %: 63 %
Platelets: 126 10*3/uL — ABNORMAL LOW (ref 150–400)
RBC: 2.83 MIL/uL — ABNORMAL LOW (ref 3.87–5.11)
RDW: 15.9 % — ABNORMAL HIGH (ref 11.5–15.5)
WBC: 2.9 10*3/uL — ABNORMAL LOW (ref 4.0–10.5)
nRBC: 0 % (ref 0.0–0.2)

## 2021-09-16 LAB — COMPREHENSIVE METABOLIC PANEL
ALT: 8 U/L (ref 0–44)
AST: 13 U/L — ABNORMAL LOW (ref 15–41)
Albumin: 3.5 g/dL (ref 3.5–5.0)
Alkaline Phosphatase: 80 U/L (ref 38–126)
Anion gap: 7 (ref 5–15)
BUN: 16 mg/dL (ref 8–23)
CO2: 28 mmol/L (ref 22–32)
Calcium: 9.2 mg/dL (ref 8.9–10.3)
Chloride: 101 mmol/L (ref 98–111)
Creatinine, Ser: 1.2 mg/dL — ABNORMAL HIGH (ref 0.44–1.00)
GFR, Estimated: 45 mL/min — ABNORMAL LOW (ref >60)
Glucose, Bld: 165 mg/dL — ABNORMAL HIGH (ref 70–99)
Potassium: 3 mmol/L — ABNORMAL LOW (ref 3.5–5.1)
Sodium: 136 mmol/L (ref 135–145)
Total Bilirubin: 0.9 mg/dL (ref 0.3–1.2)
Total Protein: 6.3 g/dL — ABNORMAL LOW (ref 6.5–8.1)

## 2021-09-16 LAB — PROTEIN ELECTROPHORESIS, SERUM
A/G Ratio: 1.3 (ref 0.7–1.7)
Albumin ELP: 3.4 g/dL (ref 2.9–4.4)
Alpha-1-Globulin: 0.2 g/dL (ref 0.0–0.4)
Alpha-2-Globulin: 0.8 g/dL (ref 0.4–1.0)
Beta Globulin: 1 g/dL (ref 0.7–1.3)
Gamma Globulin: 0.6 g/dL (ref 0.4–1.8)
Globulin, Total: 2.6 g/dL (ref 2.2–3.9)
Total Protein ELP: 6 g/dL (ref 6.0–8.5)

## 2021-09-16 LAB — MAGNESIUM: Magnesium: 1.5 mg/dL — ABNORMAL LOW (ref 1.7–2.4)

## 2021-09-16 MED ORDER — SODIUM CHLORIDE 0.9 % IV SOLN
510.0000 mg | Freq: Once | INTRAVENOUS | Status: AC
  Administered 2021-09-16: 510 mg via INTRAVENOUS
  Filled 2021-09-16: qty 510

## 2021-09-16 MED ORDER — MAGNESIUM OXIDE -MG SUPPLEMENT 400 (240 MG) MG PO TABS
800.0000 mg | ORAL_TABLET | Freq: Once | ORAL | Status: AC
  Administered 2021-09-16: 800 mg via ORAL
  Filled 2021-09-16: qty 2

## 2021-09-16 MED ORDER — POTASSIUM CHLORIDE CRYS ER 20 MEQ PO TBCR
40.0000 meq | EXTENDED_RELEASE_TABLET | Freq: Once | ORAL | Status: AC
  Administered 2021-09-16: 40 meq via ORAL
  Filled 2021-09-16: qty 2

## 2021-09-16 MED ORDER — PROCHLORPERAZINE MALEATE 10 MG PO TABS
10.0000 mg | ORAL_TABLET | Freq: Once | ORAL | Status: AC
  Administered 2021-09-16: 10 mg via ORAL
  Filled 2021-09-16: qty 1

## 2021-09-16 MED ORDER — BORTEZOMIB CHEMO SQ INJECTION 3.5 MG (2.5MG/ML)
1.3000 mg/m2 | Freq: Once | INTRAMUSCULAR | Status: AC
  Administered 2021-09-16: 2.25 mg via SUBCUTANEOUS
  Filled 2021-09-16: qty 0.9

## 2021-09-16 MED ORDER — CYANOCOBALAMIN 1000 MCG/ML IJ SOLN
1000.0000 ug | Freq: Once | INTRAMUSCULAR | Status: AC
  Administered 2021-09-16: 1000 ug via INTRAMUSCULAR
  Filled 2021-09-16: qty 1

## 2021-09-16 MED ORDER — SODIUM CHLORIDE 0.9 % IV SOLN: INTRAVENOUS | Status: DC

## 2021-09-16 MED ORDER — DEXAMETHASONE 4 MG PO TABS
20.0000 mg | ORAL_TABLET | Freq: Once | ORAL | Status: AC
  Administered 2021-09-16: 20 mg via ORAL
  Filled 2021-09-16: qty 5

## 2021-09-16 NOTE — Progress Notes (Signed)
Patient has been examined, vital signs and labs have been reviewed by Dr. Delton Coombes. ANC, Creatinine, LFTs, hemoglobin, and platelets are within treatment parameters per Dr. Delton Coombes. Patient is okay to proceed with treatment per M.D.  Give Feraheme 510 mg x 1 dose today, K-dur 40 mEq po x 1, and magnesium 800 mg po x 1 dose.

## 2021-09-16 NOTE — Progress Notes (Signed)
Labs reviewed in office visit with MD. Brittany Archer to treat, no retacrit, will give iron today per MD.  Treatment given per orders. B12 injection give per protocol. Fereheme given today per orders.   Patient tolerated it well without problems. Vitals stable and discharged home from clinic ambulatory. Follow up as scheduled.

## 2021-09-16 NOTE — Patient Instructions (Signed)
[facility]  Discharge Instructions: ?Thank you for choosing [facility] to provide your oncology and hematology care.  ?If you have a lab appointment with the Cancer Center, please come in thru the Main Entrance and check in at the main information desk. ? ? ? ?We strive to give you quality time with your provider. You may need to reschedule your appointment if you arrive late (15 or more minutes).  Arriving late affects you and other patients whose appointments are after yours.  Also, if you miss three or more appointments without notifying the office, you may be dismissed from the clinic at the provider?s discretion.    ?  ?For prescription refill requests, have your pharmacy contact our office and allow 72 hours for refills to be completed.   ? ?  ?To help prevent nausea and vomiting after your treatment, we encourage you to take your nausea medication as directed. ? ?BELOW ARE SYMPTOMS THAT SHOULD BE REPORTED IMMEDIATELY: ?*FEVER GREATER THAN 100.4 F (38 ?C) OR HIGHER ?*CHILLS OR SWEATING ?*NAUSEA AND VOMITING THAT IS NOT CONTROLLED WITH YOUR NAUSEA MEDICATION ?*UNUSUAL SHORTNESS OF BREATH ?*UNUSUAL BRUISING OR BLEEDING ?*URINARY PROBLEMS (pain or burning when urinating, or frequent urination) ?*BOWEL PROBLEMS (unusual diarrhea, constipation, pain near the anus) ?TENDERNESS IN MOUTH AND THROAT WITH OR WITHOUT PRESENCE OF ULCERS (sore throat, sores in mouth, or a toothache) ?UNUSUAL RASH, SWELLING OR PAIN  ?UNUSUAL VAGINAL DISCHARGE OR ITCHING  ? ?Items with * indicate a potential emergency and should be followed up as soon as possible or go to the Emergency Department if any problems should occur. ? ?Please show the CHEMOTHERAPY ALERT CARD or IMMUNOTHERAPY ALERT CARD at check-in to the Emergency Department and triage nurse. ? ?Should you have questions after your visit or need to cancel or reschedule your appointment, please contact [facility] [phone]  and follow  the prompts.  Office hours are 8:00 a.m. to 4:30 p.m. Monday - Friday. Please note that voicemails left after 4:00 p.m. may not be returned until the following business day.  We are closed weekends and major holidays. You have access to a nurse at all times for urgent questions. Please call the main number to the clinic [phone] and follow the prompts. ? ?For any non-urgent questions, you may also contact your provider using MyChart. We now offer e-Visits for anyone 18 and older to request care online for non-urgent symptoms. For details visit [email or web address]. ?  ?Also download the MyChart app! Go to the app store, search "MyChart", open the app, select [facility], and log in with your MyChart username and password. ? ?Due to Covid, a mask is required upon entering the hospital/clinic. If you do not have a mask, one will be given to you upon arrival. For doctor visits, patients may have 1 support person aged 18 or older with them. For treatment visits, patients cannot have anyone with them due to current Covid guidelines and our immunocompromised population.  ?

## 2021-09-16 NOTE — Patient Instructions (Addendum)
[facility] Discharge Instructions  You were seen today by Dr. Katragadda. He went over your recent results, and you received your treatment. Dr. Katragadda will see you back in 6 weeks for labs and follow up.   Thank you for choosing [facility] to provide your oncology and hematology care.  To afford each patient quality time with our provider, please arrive at least 15 minutes before your scheduled appointment time.   If you have a lab appointment with the Cancer Center please come in thru the Main Entrance and check in at the main information desk  You need to re-schedule your appointment should you arrive 10 or more minutes late.  We strive to give you quality time with our providers, and arriving late affects you and other patients whose appointments are after yours.  Also, if you no show three or more times for appointments you may be dismissed from the clinic at the providers discretion.     Again, thank you for choosing [facility].  Our hope is that these requests will decrease the amount of time that you wait before being seen by our physicians.       _____________________________________________________________  Should you have questions after your visit to [facility], please contact our office at [phone] between the hours of 8:00 a.m. and 4:30 p.m.  Voicemails left after 4:00 p.m. will not be returned until the following business day.  For prescription refill requests, have your pharmacy contact our office and allow 72 hours.    Cancer Center Support Programs:   > Cancer Support Group  2nd Tuesday of the month 1pm-2pm, Journey Room   

## 2021-09-17 ENCOUNTER — Other Ambulatory Visit (HOSPITAL_COMMUNITY): Payer: Self-pay | Admitting: Hematology

## 2021-09-30 ENCOUNTER — Inpatient Hospital Stay (HOSPITAL_COMMUNITY): Payer: Medicare Other

## 2021-09-30 ENCOUNTER — Other Ambulatory Visit: Payer: Self-pay

## 2021-09-30 ENCOUNTER — Inpatient Hospital Stay (HOSPITAL_COMMUNITY): Payer: Medicare Other | Attending: Hematology

## 2021-09-30 DIAGNOSIS — C9000 Multiple myeloma not having achieved remission: Secondary | ICD-10-CM

## 2021-09-30 DIAGNOSIS — N1832 Chronic kidney disease, stage 3b: Secondary | ICD-10-CM | POA: Diagnosis present

## 2021-09-30 DIAGNOSIS — Z5112 Encounter for antineoplastic immunotherapy: Secondary | ICD-10-CM | POA: Insufficient documentation

## 2021-09-30 DIAGNOSIS — E876 Hypokalemia: Secondary | ICD-10-CM | POA: Insufficient documentation

## 2021-09-30 DIAGNOSIS — E538 Deficiency of other specified B group vitamins: Secondary | ICD-10-CM

## 2021-09-30 DIAGNOSIS — D631 Anemia in chronic kidney disease: Secondary | ICD-10-CM | POA: Insufficient documentation

## 2021-09-30 DIAGNOSIS — N189 Chronic kidney disease, unspecified: Secondary | ICD-10-CM | POA: Diagnosis not present

## 2021-09-30 LAB — CBC WITH DIFFERENTIAL/PLATELET
Abs Immature Granulocytes: 0.02 10*3/uL (ref 0.00–0.07)
Basophils Absolute: 0 10*3/uL (ref 0.0–0.1)
Basophils Relative: 1 %
Eosinophils Absolute: 0.1 10*3/uL (ref 0.0–0.5)
Eosinophils Relative: 2 %
HCT: 28.4 % — ABNORMAL LOW (ref 36.0–46.0)
Hemoglobin: 9.4 g/dL — ABNORMAL LOW (ref 12.0–15.0)
Immature Granulocytes: 1 %
Lymphocytes Relative: 21 %
Lymphs Abs: 0.7 10*3/uL (ref 0.7–4.0)
MCH: 34.2 pg — ABNORMAL HIGH (ref 26.0–34.0)
MCHC: 33.1 g/dL (ref 30.0–36.0)
MCV: 103.3 fL — ABNORMAL HIGH (ref 80.0–100.0)
Monocytes Absolute: 0.6 10*3/uL (ref 0.1–1.0)
Monocytes Relative: 16 %
Neutro Abs: 2.2 10*3/uL (ref 1.7–7.7)
Neutrophils Relative %: 59 %
Platelets: 160 10*3/uL (ref 150–400)
RBC: 2.75 MIL/uL — ABNORMAL LOW (ref 3.87–5.11)
RDW: 15.7 % — ABNORMAL HIGH (ref 11.5–15.5)
WBC: 3.6 10*3/uL — ABNORMAL LOW (ref 4.0–10.5)
nRBC: 0 % (ref 0.0–0.2)

## 2021-09-30 LAB — COMPREHENSIVE METABOLIC PANEL
ALT: 11 U/L (ref 0–44)
AST: 13 U/L — ABNORMAL LOW (ref 15–41)
Albumin: 3.3 g/dL — ABNORMAL LOW (ref 3.5–5.0)
Alkaline Phosphatase: 81 U/L (ref 38–126)
Anion gap: 6 (ref 5–15)
BUN: 16 mg/dL (ref 8–23)
CO2: 28 mmol/L (ref 22–32)
Calcium: 8.9 mg/dL (ref 8.9–10.3)
Chloride: 106 mmol/L (ref 98–111)
Creatinine, Ser: 1.11 mg/dL — ABNORMAL HIGH (ref 0.44–1.00)
GFR, Estimated: 50 mL/min — ABNORMAL LOW (ref >60)
Glucose, Bld: 137 mg/dL — ABNORMAL HIGH (ref 70–99)
Potassium: 3.2 mmol/L — ABNORMAL LOW (ref 3.5–5.1)
Sodium: 140 mmol/L (ref 135–145)
Total Bilirubin: 0.9 mg/dL (ref 0.3–1.2)
Total Protein: 6.2 g/dL — ABNORMAL LOW (ref 6.5–8.1)

## 2021-09-30 LAB — MAGNESIUM: Magnesium: 1 mg/dL — ABNORMAL LOW (ref 1.7–2.4)

## 2021-09-30 MED ORDER — DEXAMETHASONE 4 MG PO TABS
20.0000 mg | ORAL_TABLET | Freq: Once | ORAL | Status: AC
  Administered 2021-09-30: 20 mg via ORAL
  Filled 2021-09-30: qty 5

## 2021-09-30 MED ORDER — PROCHLORPERAZINE MALEATE 10 MG PO TABS
10.0000 mg | ORAL_TABLET | Freq: Once | ORAL | Status: AC
  Administered 2021-09-30: 10 mg via ORAL
  Filled 2021-09-30: qty 1

## 2021-09-30 MED ORDER — SODIUM CHLORIDE 0.9 % IV SOLN: INTRAVENOUS | Status: DC

## 2021-09-30 MED ORDER — POTASSIUM CHLORIDE CRYS ER 20 MEQ PO TBCR
40.0000 meq | EXTENDED_RELEASE_TABLET | Freq: Once | ORAL | Status: AC
  Administered 2021-09-30: 40 meq via ORAL
  Filled 2021-09-30: qty 2

## 2021-09-30 MED ORDER — MAGNESIUM SULFATE 4 GM/100ML IV SOLN: 4.0000 g | Freq: Once | INTRAVENOUS | Status: DC

## 2021-09-30 MED ORDER — BORTEZOMIB CHEMO SQ INJECTION 3.5 MG (2.5MG/ML)
1.3000 mg/m2 | Freq: Once | INTRAMUSCULAR | Status: AC
  Administered 2021-09-30: 2.25 mg via SUBCUTANEOUS
  Filled 2021-09-30: qty 0.9

## 2021-09-30 MED ORDER — EPOETIN ALFA-EPBX 20000 UNIT/ML IJ SOLN
20000.0000 [IU] | Freq: Once | INTRAMUSCULAR | Status: AC
  Administered 2021-09-30: 20000 [IU] via SUBCUTANEOUS
  Filled 2021-09-30: qty 1

## 2021-09-30 MED ORDER — MAGNESIUM SULFATE 2 GM/50ML IV SOLN
2.0000 g | INTRAVENOUS | Status: AC
  Administered 2021-09-30 (×2): 2 g via INTRAVENOUS
  Filled 2021-09-30 (×2): qty 50

## 2021-09-30 NOTE — Progress Notes (Signed)
Pt's presents today for Velcade and Retacrit injections per provider's order. Pt's  magnesium was 1, potassium was 3.2, and hbg was 9.4. Okay for treatment today per Dr. Raliegh Ip verbal order given for magnesium 4g IV and potassium 40 mEq p.o per Dr.K's standing order.  Velcade and Retacrit injections and 4g of Magnesium IV  given today per MD orders. Tolerated infusion without adverse affects. Vital signs stable. No complaints at this time. Discharged from clinic ambulatory in stable condition. Alert and oriented x 3. F/U with [facility] as scheduled.

## 2021-09-30 NOTE — Patient Instructions (Signed)
[facility]  Discharge Instructions: Thank you for choosing [facility] to provide your oncology and hematology care.  If you have a lab appointment with the [facility], please come in thru the Main Entrance and check in at the main information desk.  Wear comfortable clothing and clothing appropriate for easy access to any Portacath or PICC line.   We strive to give you quality time with your provider. You may need to reschedule your appointment if you arrive late (15 or more minutes).  Arriving late affects you and other patients whose appointments are after yours.  Also, if you miss three or more appointments without notifying the office, you may be dismissed from the clinic at the provider's discretion.      For prescription refill requests, have your pharmacy contact our office and allow 72 hours for refills to be completed.    Today you received the following chemotherapy and/or immunotherapy agents Velcade  and Retacrit injection and 4g of Magnesium IV.   To help prevent nausea and vomiting after your treatment, we encourage you to take your nausea medication as directed.  BELOW ARE SYMPTOMS THAT SHOULD BE REPORTED IMMEDIATELY: *FEVER GREATER THAN 100.4 F (38 C) OR HIGHER *CHILLS OR SWEATING *NAUSEA AND VOMITING THAT IS NOT CONTROLLED WITH YOUR NAUSEA MEDICATION *UNUSUAL SHORTNESS OF BREATH *UNUSUAL BRUISING OR BLEEDING *URINARY PROBLEMS (pain or burning when urinating, or frequent urination) *BOWEL PROBLEMS (unusual diarrhea, constipation, pain near the anus) TENDERNESS IN MOUTH AND THROAT WITH OR WITHOUT PRESENCE OF ULCERS (sore throat, sores in mouth, or a toothache) UNUSUAL RASH, SWELLING OR PAIN  UNUSUAL VAGINAL DISCHARGE OR ITCHING   Items with * indicate a potential emergency and should be followed up as soon as possible or go to the Emergency Department if any problems should occur.  Please show the CHEMOTHERAPY ALERT CARD or IMMUNOTHERAPY  ALERT CARD at check-in to the Emergency Department and triage nurse.  Should you have questions after your visit or need to cancel or reschedule your appointment, please contact [facility] [phone]  and follow the prompts.  Office hours are 8:00 a.m. to 4:30 p.m. Monday - Friday. Please note that voicemails left after 4:00 p.m. may not be returned until the following business day.  We are closed weekends and major holidays. You have access to a nurse at all times for urgent questions. Please call the main number to the clinic [phone] and follow the prompts.  For any non-urgent questions, you may also contact your provider using MyChart. We now offer e-Visits for anyone 20 and older to request care online for non-urgent symptoms. For details visit [email or web address].   Also download the MyChart app! Go to the app store, search "MyChart", open the app, select [place], and log in with your MyChart username and password.  Due to Covid, a mask is required upon entering the hospital/clinic. If you do not have a mask, one will be given to you upon arrival. For doctor visits, patients may have 1 support person aged 24 or older with them. For treatment visits, patients cannot have anyone with them due to current Covid guidelines and our immunocompromised population.

## 2021-09-30 NOTE — Progress Notes (Signed)
Patient is taking Revlimid as prescribed.  They have not missed any doses and report no side effects at this time.

## 2021-10-06 ENCOUNTER — Other Ambulatory Visit (HOSPITAL_COMMUNITY): Payer: Self-pay

## 2021-10-06 DIAGNOSIS — C9000 Multiple myeloma not having achieved remission: Secondary | ICD-10-CM

## 2021-10-06 MED ORDER — LENALIDOMIDE 20 MG PO CAPS: ORAL_CAPSULE | ORAL | 0 refills | Status: DC

## 2021-10-06 NOTE — Telephone Encounter (Signed)
Chart reviewed. Revlimid refilled per last office note with Dr. Katragadda.  

## 2021-10-07 ENCOUNTER — Encounter (HOSPITAL_COMMUNITY): Payer: Self-pay

## 2021-10-07 ENCOUNTER — Inpatient Hospital Stay (HOSPITAL_COMMUNITY): Payer: Medicare Other

## 2021-10-07 ENCOUNTER — Other Ambulatory Visit: Payer: Self-pay

## 2021-10-07 VITALS — BP 125/61 | HR 68 | Temp 97.3°F | Resp 18 | Wt 167.4 lb

## 2021-10-07 DIAGNOSIS — Z5112 Encounter for antineoplastic immunotherapy: Secondary | ICD-10-CM | POA: Diagnosis not present

## 2021-10-07 DIAGNOSIS — E876 Hypokalemia: Secondary | ICD-10-CM

## 2021-10-07 DIAGNOSIS — C9000 Multiple myeloma not having achieved remission: Secondary | ICD-10-CM

## 2021-10-07 DIAGNOSIS — E538 Deficiency of other specified B group vitamins: Secondary | ICD-10-CM

## 2021-10-07 LAB — CBC WITH DIFFERENTIAL/PLATELET
Abs Immature Granulocytes: 0.03 10*3/uL (ref 0.00–0.07)
Basophils Absolute: 0 10*3/uL (ref 0.0–0.1)
Basophils Relative: 1 %
Eosinophils Absolute: 0.1 10*3/uL (ref 0.0–0.5)
Eosinophils Relative: 3 %
HCT: 29.6 % — ABNORMAL LOW (ref 36.0–46.0)
Hemoglobin: 9.5 g/dL — ABNORMAL LOW (ref 12.0–15.0)
Immature Granulocytes: 1 %
Lymphocytes Relative: 24 %
Lymphs Abs: 0.7 10*3/uL (ref 0.7–4.0)
MCH: 33.2 pg (ref 26.0–34.0)
MCHC: 32.1 g/dL (ref 30.0–36.0)
MCV: 103.5 fL — ABNORMAL HIGH (ref 80.0–100.0)
Monocytes Absolute: 0.4 10*3/uL (ref 0.1–1.0)
Monocytes Relative: 11 %
Neutro Abs: 1.9 10*3/uL (ref 1.7–7.7)
Neutrophils Relative %: 60 %
Platelets: 139 10*3/uL — ABNORMAL LOW (ref 150–400)
RBC: 2.86 MIL/uL — ABNORMAL LOW (ref 3.87–5.11)
RDW: 16 % — ABNORMAL HIGH (ref 11.5–15.5)
WBC: 3.1 10*3/uL — ABNORMAL LOW (ref 4.0–10.5)
nRBC: 0.7 % — ABNORMAL HIGH (ref 0.0–0.2)

## 2021-10-07 LAB — MAGNESIUM: Magnesium: 1.2 mg/dL — ABNORMAL LOW (ref 1.7–2.4)

## 2021-10-07 LAB — COMPREHENSIVE METABOLIC PANEL
ALT: 10 U/L (ref 0–44)
AST: 14 U/L — ABNORMAL LOW (ref 15–41)
Albumin: 3.4 g/dL — ABNORMAL LOW (ref 3.5–5.0)
Alkaline Phosphatase: 82 U/L (ref 38–126)
Anion gap: 10 (ref 5–15)
BUN: 14 mg/dL (ref 8–23)
CO2: 26 mmol/L (ref 22–32)
Calcium: 8.8 mg/dL — ABNORMAL LOW (ref 8.9–10.3)
Chloride: 103 mmol/L (ref 98–111)
Creatinine, Ser: 1.18 mg/dL — ABNORMAL HIGH (ref 0.44–1.00)
GFR, Estimated: 46 mL/min — ABNORMAL LOW (ref >60)
Glucose, Bld: 173 mg/dL — ABNORMAL HIGH (ref 70–99)
Potassium: 2.5 mmol/L — CL (ref 3.5–5.1)
Sodium: 139 mmol/L (ref 135–145)
Total Bilirubin: 0.8 mg/dL (ref 0.3–1.2)
Total Protein: 6.2 g/dL — ABNORMAL LOW (ref 6.5–8.1)

## 2021-10-07 MED ORDER — POTASSIUM CHLORIDE 10 MEQ/100ML IV SOLN
10.0000 meq | INTRAVENOUS | Status: AC
  Administered 2021-10-07 (×2): 10 meq via INTRAVENOUS
  Filled 2021-10-07 (×2): qty 100

## 2021-10-07 MED ORDER — DEXAMETHASONE 4 MG PO TABS
20.0000 mg | ORAL_TABLET | Freq: Once | ORAL | Status: AC
  Administered 2021-10-07: 20 mg via ORAL
  Filled 2021-10-07: qty 5

## 2021-10-07 MED ORDER — EPOETIN ALFA-EPBX 10000 UNIT/ML IJ SOLN
20000.0000 [IU] | Freq: Once | INTRAMUSCULAR | Status: AC
  Administered 2021-10-07: 20000 [IU] via SUBCUTANEOUS
  Filled 2021-10-07: qty 2

## 2021-10-07 MED ORDER — BORTEZOMIB CHEMO SQ INJECTION 3.5 MG (2.5MG/ML)
1.3000 mg/m2 | Freq: Once | INTRAMUSCULAR | Status: AC
  Administered 2021-10-07: 2.25 mg via SUBCUTANEOUS
  Filled 2021-10-07: qty 0.9

## 2021-10-07 MED ORDER — PROCHLORPERAZINE MALEATE 10 MG PO TABS
10.0000 mg | ORAL_TABLET | Freq: Once | ORAL | Status: AC
  Administered 2021-10-07: 10 mg via ORAL
  Filled 2021-10-07: qty 1

## 2021-10-07 MED ORDER — POTASSIUM CHLORIDE CRYS ER 20 MEQ PO TBCR
40.0000 meq | EXTENDED_RELEASE_TABLET | ORAL | Status: AC
  Administered 2021-10-07 (×2): 40 meq via ORAL
  Filled 2021-10-07 (×2): qty 2

## 2021-10-07 MED ORDER — SODIUM CHLORIDE 0.9 % IV SOLN: INTRAVENOUS | Status: DC

## 2021-10-07 MED ORDER — MAGNESIUM SULFATE 2 GM/50ML IV SOLN
2.0000 g | INTRAVENOUS | Status: AC
  Administered 2021-10-07 (×2): 2 g via INTRAVENOUS
  Filled 2021-10-07 (×2): qty 50

## 2021-10-07 NOTE — Patient Instructions (Signed)
[facility]  Discharge Instructions: Thank you for choosing [facility] to provide your oncology and hematology care.  If you have a lab appointment with the [facility], please come in thru the Main Entrance and check in at the main information desk.  Wear comfortable clothing and clothing appropriate for easy access to any Portacath or PICC line.   We strive to give you quality time with your provider. You may need to reschedule your appointment if you arrive late (15 or more minutes).  Arriving late affects you and other patients whose appointments are after yours.  Also, if you miss three or more appointments without notifying the office, you may be dismissed from the clinic at the provider's discretion.      For prescription refill requests, have your pharmacy contact our office and allow 72 hours for refills to be completed.    Today you received the following chemotherapy and/or immunotherapy agents velcade and retacrit Hemoglobin 9.5 today.  Magnesium level 1.2 today.  4 grams IV magnesium given.  Potassium level 2.5 today.  60mq PO potassium given then 277m IV given then 4033mPO given of potassium to help increase your potassium level.      To help prevent nausea and vomiting after your treatment, we encourage you to take your nausea medication as directed.  BELOW ARE SYMPTOMS THAT SHOULD BE REPORTED IMMEDIATELY: *FEVER GREATER THAN 100.4 F (38 C) OR HIGHER *CHILLS OR SWEATING *NAUSEA AND VOMITING THAT IS NOT CONTROLLED WITH YOUR NAUSEA MEDICATION *UNUSUAL SHORTNESS OF BREATH *UNUSUAL BRUISING OR BLEEDING *URINARY PROBLEMS (pain or burning when urinating, or frequent urination) *BOWEL PROBLEMS (unusual diarrhea, constipation, pain near the anus) TENDERNESS IN MOUTH AND THROAT WITH OR WITHOUT PRESENCE OF ULCERS (sore throat, sores in mouth, or a toothache) UNUSUAL RASH, SWELLING OR PAIN  UNUSUAL VAGINAL DISCHARGE OR ITCHING   Items with * indicate a  potential emergency and should be followed up as soon as possible or go to the Emergency Department if any problems should occur.  Please show the CHEMOTHERAPY ALERT CARD or IMMUNOTHERAPY ALERT CARD at check-in to the Emergency Department and triage nurse.  Should you have questions after your visit or need to cancel or reschedule your appointment, please contact [phone] follow the prompts.  Office hours are 8:00 a.m. to 4:30 p.m. Monday - Friday. Please note that voicemails left after 4:00 p.m. may not be returned until the following business day.  We are closed weekends and major holidays. You have access to a nurse at all times for urgent questions. Please call the main number to the clinic [phone] follow the prompts.  For any non-urgent questions, you may also contact your provider using MyChart. We now offer e-Visits for anyone 18 74d older to request care online for non-urgent symptoms. For details visit [email or web address] Also download the MyChart app! Go to the app store, search "MyChart", open the app, select [facility], and log in with your MyChart username and password.  Due to Covid, a mask is required upon entering the hospital/clinic. If you do not have a mask, one will be given to you upon arrival. For doctor visits, patients may have 1 support person aged 18 36 older with them. For treatment visits, patients cannot have anyone with them due to current Covid guidelines and our immunocompromised population.   Bortezomib injection What is this medication? BORTEZOMIB (bor TEZ oh mib) targets proteins in cancer cells  and stops the cancer cells from growing. It treats multiple myeloma and mantle cell lymphoma. This medicine may be used for other purposes; ask your health care provider or pharmacist if you have questions. COMMON BRAND NAME(S): Velcade What should I tell my care team before I take this medication? They need to know if you  have any of these conditions: dehydration diabetes (high blood sugar) heart disease liver disease tingling of the fingers or toes or other nerve disorder an unusual or allergic reaction to bortezomib, mannitol, boron, other medicines, foods, dyes, or preservatives pregnant or trying to get pregnant breast-feeding How should I use this medication? This medicine is injected into a vein or under the skin. It is given by a health care provider in a hospital or clinic setting. Talk to your health care provider about the use of this medicine in children. Special care may be needed. Overdosage: If you think you have taken too much of this medicine contact a poison control center or emergency room at once. NOTE: This medicine is only for you. Do not share this medicine with others. What if I miss a dose? Keep appointments for follow-up doses. It is important not to miss your dose. Call your health care provider if you are unable to keep an appointment. What may interact with this medication? This medicine may interact with the following medications: ketoconazole rifampin This list may not describe all possible interactions. Give your health care provider a list of all the medicines, herbs, non-prescription drugs, or dietary supplements you use. Also tell them if you smoke, drink alcohol, or use illegal drugs. Some items may interact with your medicine. What should I watch for while using this medication? Your condition will be monitored carefully while you are receiving this medicine. You may need blood work done while you are taking this medicine. You may get drowsy or dizzy. Do not drive, use machinery, or do anything that needs mental alertness until you know how this medicine affects you. Do not stand up or sit up quickly, especially if you are an older patient. This reduces the risk of dizzy or fainting spells This medicine may increase your risk of getting an infection. Call your health care  provider for advice if you get a fever, chills, sore throat, or other symptoms of a cold or flu. Do not treat yourself. Try to avoid being around people who are sick. Check with your health care provider if you have severe diarrhea, nausea, and vomiting, or if you sweat a lot. The loss of too much body fluid may make it dangerous for you to take this medicine. Do not become pregnant while taking this medicine or for 7 months after stopping it. Women should inform their health care provider if they wish to become pregnant or think they might be pregnant. Men should not father a child while taking this medicine and for 4 months after stopping it. There is a potential for serious harm to an unborn child. Talk to your health care provider for more information. Do not breast-feed an infant while taking this medicine or for 2 months after stopping it. This medicine may make it more difficult to get pregnant or father a child. Talk to your health care provider if you are concerned about your fertility. What side effects may I notice from receiving this medication? Side effects that you should report to your doctor or health care professional as soon as possible: allergic reactions (skin rash; itching or hives; swelling of  the face, lips, or tongue) bleeding (bloody or black, tarry stools; red or dark brown urine; spitting up blood or brown material that looks like coffee grounds; red spots on the skin; unusual bruising or bleeding from the eye, gums, or nose) blurred vision or changes in vision confusion constipation headache heart failure (trouble breathing; fast, irregular heartbeat; sudden weight gain; swelling of the ankles, feet, hands) infection (fever, chills, cough, sore throat, pain or trouble passing urine) lack or loss of appetite liver injury (dark yellow or brown urine; general ill feeling or flu-like symptoms; loss of appetite, right upper belly pain; yellowing of the eyes or skin) low blood  pressure (dizziness; feeling faint or lightheaded, falls; unusually weak or tired) muscle cramps pain, redness, or irritation at site where injected pain, tingling, numbness in the hands or feet seizures trouble breathing unusual bruising or bleeding Side effects that usually do not require medical attention (report to your doctor or health care professional if they continue or are bothersome): diarrhea nausea stomach pain trouble sleeping vomiting This list may not describe all possible side effects. Call your doctor for medical advice about side effects. You may report side effects to FDA at 1-800-FDA-1088. Where should I keep my medication? This medicine is given in a hospital or clinic. It will not be stored at home. NOTE: This sheet is a summary. It may not cover all possible information. If you have questions about this medicine, talk to your doctor, pharmacist, or health care provider.  2022 Elsevier/Gold Standard (2020-12-05 13:22:53)

## 2021-10-07 NOTE — Progress Notes (Signed)
CRITICAL VALUE ALERT Critical value received:  K+  2.5 Date of notification:  10/07/21 Time of notification: 1010 Critical value read back:  Yes.   Nurse who received alert:  C. Krishiv Sandler RN MD notified time and response:  Dr. Raliegh Ip, treat per standing orders.

## 2021-10-07 NOTE — Progress Notes (Signed)
potassium 2.5, pt is going to get 40 meq potassium PO,  then 20 meq potassium IV, then 40 meq potassium PO per standing orders from dr K.  Magnesium 1.2.  Give magnesium 4 grams over 2 hrs per Dr Raliegh Ip standing orders.  Hemoglobin 9.5.  Okay for retacrit 20,000 units.   Okay for treatment today.  Vital sign and other labs WNL for treatment today.   Tolerated treatment today without incidence.  Stable during and after treatment.  AVS reviewed.  Discharged in stable condition ambulatory.

## 2021-10-14 ENCOUNTER — Inpatient Hospital Stay (HOSPITAL_COMMUNITY): Payer: Medicare Other

## 2021-10-14 ENCOUNTER — Other Ambulatory Visit: Payer: Self-pay

## 2021-10-14 VITALS — BP 106/54 | HR 57 | Temp 96.9°F | Resp 18

## 2021-10-14 DIAGNOSIS — E876 Hypokalemia: Secondary | ICD-10-CM

## 2021-10-14 DIAGNOSIS — C9000 Multiple myeloma not having achieved remission: Secondary | ICD-10-CM

## 2021-10-14 DIAGNOSIS — Z5112 Encounter for antineoplastic immunotherapy: Secondary | ICD-10-CM | POA: Diagnosis not present

## 2021-10-14 DIAGNOSIS — E538 Deficiency of other specified B group vitamins: Secondary | ICD-10-CM

## 2021-10-14 LAB — COMPREHENSIVE METABOLIC PANEL
ALT: 10 U/L (ref 0–44)
AST: 11 U/L — ABNORMAL LOW (ref 15–41)
Albumin: 3.6 g/dL (ref 3.5–5.0)
Alkaline Phosphatase: 85 U/L (ref 38–126)
Anion gap: 7 (ref 5–15)
BUN: 23 mg/dL (ref 8–23)
CO2: 27 mmol/L (ref 22–32)
Calcium: 9.1 mg/dL (ref 8.9–10.3)
Chloride: 104 mmol/L (ref 98–111)
Creatinine, Ser: 1.11 mg/dL — ABNORMAL HIGH (ref 0.44–1.00)
GFR, Estimated: 50 mL/min — ABNORMAL LOW (ref >60)
Glucose, Bld: 75 mg/dL (ref 70–99)
Potassium: 2.5 mmol/L — CL (ref 3.5–5.1)
Sodium: 138 mmol/L (ref 135–145)
Total Bilirubin: 0.9 mg/dL (ref 0.3–1.2)
Total Protein: 6.5 g/dL (ref 6.5–8.1)

## 2021-10-14 LAB — MAGNESIUM: Magnesium: 1.4 mg/dL — ABNORMAL LOW (ref 1.7–2.4)

## 2021-10-14 LAB — CBC WITH DIFFERENTIAL/PLATELET
Abs Immature Granulocytes: 0.02 10*3/uL (ref 0.00–0.07)
Basophils Absolute: 0 10*3/uL (ref 0.0–0.1)
Basophils Relative: 0 %
Eosinophils Absolute: 0.1 10*3/uL (ref 0.0–0.5)
Eosinophils Relative: 3 %
HCT: 32.2 % — ABNORMAL LOW (ref 36.0–46.0)
Hemoglobin: 10.5 g/dL — ABNORMAL LOW (ref 12.0–15.0)
Immature Granulocytes: 1 %
Lymphocytes Relative: 22 %
Lymphs Abs: 0.7 10*3/uL (ref 0.7–4.0)
MCH: 34.4 pg — ABNORMAL HIGH (ref 26.0–34.0)
MCHC: 32.6 g/dL (ref 30.0–36.0)
MCV: 105.6 fL — ABNORMAL HIGH (ref 80.0–100.0)
Monocytes Absolute: 0.4 10*3/uL (ref 0.1–1.0)
Monocytes Relative: 11 %
Neutro Abs: 2 10*3/uL (ref 1.7–7.7)
Neutrophils Relative %: 63 %
Platelets: 148 10*3/uL — ABNORMAL LOW (ref 150–400)
RBC: 3.05 MIL/uL — ABNORMAL LOW (ref 3.87–5.11)
RDW: 17.7 % — ABNORMAL HIGH (ref 11.5–15.5)
WBC: 3.2 10*3/uL — ABNORMAL LOW (ref 4.0–10.5)
nRBC: 0 % (ref 0.0–0.2)

## 2021-10-14 LAB — LACTATE DEHYDROGENASE: LDH: 132 U/L (ref 98–192)

## 2021-10-14 MED ORDER — BORTEZOMIB CHEMO SQ INJECTION 3.5 MG (2.5MG/ML)
1.3000 mg/m2 | Freq: Once | INTRAMUSCULAR | Status: AC
  Administered 2021-10-14: 2.25 mg via SUBCUTANEOUS
  Filled 2021-10-14: qty 0.9

## 2021-10-14 MED ORDER — CYANOCOBALAMIN 1000 MCG/ML IJ SOLN
1000.0000 ug | Freq: Once | INTRAMUSCULAR | Status: AC
Start: 2021-10-14 — End: 2021-10-14
  Administered 2021-10-14: 1000 ug via INTRAMUSCULAR
  Filled 2021-10-14: qty 1

## 2021-10-14 MED ORDER — SODIUM CHLORIDE 0.9 % IV SOLN: INTRAVENOUS | Status: DC

## 2021-10-14 MED ORDER — DEXAMETHASONE 4 MG PO TABS
20.0000 mg | ORAL_TABLET | Freq: Once | ORAL | Status: AC
  Administered 2021-10-14: 20 mg via ORAL
  Filled 2021-10-14: qty 5

## 2021-10-14 MED ORDER — MAGNESIUM SULFATE 2 GM/50ML IV SOLN
2.0000 g | INTRAVENOUS | Status: AC
  Administered 2021-10-14 (×2): 2 g via INTRAVENOUS
  Filled 2021-10-14 (×2): qty 50

## 2021-10-14 MED ORDER — POTASSIUM CHLORIDE 10 MEQ/100ML IV SOLN
10.0000 meq | INTRAVENOUS | Status: AC
  Administered 2021-10-14 (×2): 10 meq via INTRAVENOUS
  Filled 2021-10-14 (×2): qty 100

## 2021-10-14 MED ORDER — PROCHLORPERAZINE MALEATE 10 MG PO TABS
10.0000 mg | ORAL_TABLET | Freq: Once | ORAL | Status: AC
  Administered 2021-10-14: 10 mg via ORAL
  Filled 2021-10-14: qty 1

## 2021-10-14 MED ORDER — POTASSIUM CHLORIDE CRYS ER 20 MEQ PO TBCR
40.0000 meq | EXTENDED_RELEASE_TABLET | ORAL | Status: AC
  Administered 2021-10-14 (×2): 40 meq via ORAL
  Filled 2021-10-14 (×2): qty 2

## 2021-10-14 NOTE — Patient Instructions (Signed)
[facility]  Discharge Instructions: Thank you for choosing [facility] to provide your oncology and hematology care.  If you have a lab appointment with the [facility], please come in thru the Main Entrance and check in at the main information desk.  Wear comfortable clothing and clothing appropriate for easy access to any Portacath or PICC line.   We strive to give you quality time with your provider. You may need to reschedule your appointment if you arrive late (15 or more minutes).  Arriving late affects you and other patients whose appointments are after yours.  Also, if you miss three or more appointments without notifying the office, you may be dismissed from the clinic at the provider's discretion.      For prescription refill requests, have your pharmacy contact our office and allow 72 hours for refills to be completed.    Today you received the following chemotherapy and/or immunotherapy agents Velcade and B12  injection.4g of Magnesium IV  and 41mEq  IV of Potassium and 40 mEq p.o pre and post K IV.   BELOW ARE SYMPTOMS THAT SHOULD BE REPORTED IMMEDIATELY: *FEVER GREATER THAN 100.4 F (38 C) OR HIGHER *CHILLS OR SWEATING *NAUSEA AND VOMITING THAT IS NOT CONTROLLED WITH YOUR NAUSEA MEDICATION *UNUSUAL SHORTNESS OF BREATH *UNUSUAL BRUISING OR BLEEDING *URINARY PROBLEMS (pain or burning when urinating, or frequent urination) *BOWEL PROBLEMS (unusual diarrhea, constipation, pain near the anus) TENDERNESS IN MOUTH AND THROAT WITH OR WITHOUT PRESENCE OF ULCERS (sore throat, sores in mouth, or a toothache) UNUSUAL RASH, SWELLING OR PAIN  UNUSUAL VAGINAL DISCHARGE OR ITCHING   Items with * indicate a potential emergency and should be followed up as soon as possible or go to the Emergency Department if any problems should occur.  Please show the CHEMOTHERAPY ALERT CARD or IMMUNOTHERAPY ALERT CARD at check-in to the Emergency Department and triage  nurse.  Should you have questions after your visit or need to cancel or reschedule your appointment, please contact [facility] [phone]  and follow the prompts.  Office hours are 8:00 a.m. to 4:30 p.m. Monday - Friday. Please note that voicemails left after 4:00 p.m. may not be returned until the following business day.  We are closed weekends and major holidays. You have access to a nurse at all times for urgent questions. Please call the main number to the clinic [phone] and follow the prompts.  For any non-urgent questions, you may also contact your provider using MyChart. We now offer e-Visits for anyone 35 and older to request care online for non-urgent symptoms. For details visit [email or web address].   Also download the MyChart app! Go to the app store, search "MyChart", open the app, select [facility], and log in with your MyChart username and password.  Due to Covid, a mask is required upon entering the hospital/clinic. If you do not have a mask, one will be given to you upon arrival. For doctor visits, patients may have 1 support person aged 13 or older with them. For treatment visits, patients cannot have anyone with them due to current Covid guidelines and our immunocompromised population.

## 2021-10-14 NOTE — Progress Notes (Signed)
[facility]any [facility] here for velcade, B12, and retacrit.  Magnesium 1.4. 4 grams magnesium IV over 2 hours.  Potassium 2.5. 40 meq potassium PO then 20 meq IV then 40 meq PO per standing orders from Dr Raliegh Ip. Hemoglobin 10.5.  No retacrit needed today due to hemoglobin being greater than 10.

## 2021-10-14 NOTE — Progress Notes (Signed)
Velcade and B12 injections, 4g of Magnesium, and 20 mEq of potassium IV and 40 mEq p.o pre and post potassium IV. given today per MD orders. Tolerated infusion without adverse affects. Vital signs stable. No complaints at this time. Discharged from clinic ambulatory in stable condition. Alert and oriented x 3. F/U with [facility] as scheduled.

## 2021-10-15 LAB — KAPPA/LAMBDA LIGHT CHAINS
Kappa free light chain: 34.1 mg/L — ABNORMAL HIGH (ref 3.3–19.4)
Kappa, lambda light chain ratio: 2.2 — ABNORMAL HIGH (ref 0.26–1.65)
Lambda free light chains: 15.5 mg/L (ref 5.7–26.3)

## 2021-10-17 LAB — PROTEIN ELECTROPHORESIS, SERUM
A/G Ratio: 1.6 (ref 0.7–1.7)
Albumin ELP: 3.6 g/dL (ref 2.9–4.4)
Alpha-1-Globulin: 0.1 g/dL (ref 0.0–0.4)
Alpha-2-Globulin: 0.7 g/dL (ref 0.4–1.0)
Beta Globulin: 0.8 g/dL (ref 0.7–1.3)
Gamma Globulin: 0.6 g/dL (ref 0.4–1.8)
Globulin, Total: 2.2 g/dL (ref 2.2–3.9)
M-Spike, %: 0.2 g/dL — ABNORMAL HIGH
Total Protein ELP: 5.8 g/dL — ABNORMAL LOW (ref 6.0–8.5)

## 2021-10-27 ENCOUNTER — Other Ambulatory Visit (HOSPITAL_COMMUNITY): Payer: Self-pay | Admitting: *Deleted

## 2021-10-27 DIAGNOSIS — C9000 Multiple myeloma not having achieved remission: Secondary | ICD-10-CM

## 2021-10-27 NOTE — Progress Notes (Signed)
[facility] [address] [place] [address]   CLINIC:  Medical Oncology/Hematology  PCP:  [name], MD [address] [place] / [place] [address] [phone]   REASON FOR VISIT:  Follow-up for multiple myeloma  PRIOR THERAPY: none  NGS Results: not done  CURRENT THERAPY: Velcade 3/4 weeks; Revlimid 20 mg 2/3 weeks  BRIEF ONCOLOGIC HISTORY:  Oncology History  Multiple myeloma not having achieved remission (HCC)  01/20/2018 Initial Diagnosis   Multiple myeloma not having achieved remission (HCC)   01/26/2018 -  Chemotherapy   Patient is on Treatment Plan : MYELOMA  RVD SQ (Bortezomib d 1,8,15 ) q28d x 4 cycles       CANCER STAGING: Cancer Staging No matching staging information was found for the patient.  INTERVAL HISTORY:  Brittany Archer, a 80 y.o. female, returns for routine follow-up and consideration for next cycle of chemotherapy. Brittany Archer was last seen on 09/16/2021.  Due for cycle #48 of Velcade today.   Overall, she tells me she has been feeling pretty well. She denies tingling/numbness, current bleeding, and n/v/d. She is taking Revlimid and tolerating it well.   Overall, she feels ready for next cycle of chemo today.   REVIEW OF SYSTEMS:  Review of Systems  Constitutional:  Negative for appetite change (75%) and fatigue (60%).  HENT:   Negative for nosebleeds.   Respiratory:  Negative for hemoptysis.   Gastrointestinal:  Negative for blood in stool, diarrhea, nausea and vomiting.  Genitourinary:  Negative for hematuria and vaginal bleeding.   Neurological:  Negative for numbness.  Hematological:  Does not bruise/bleed easily.  All other systems reviewed and are negative.  PAST MEDICAL/SURGICAL HISTORY:  Past Medical History:  Diagnosis Date   Breast cancer (HCC)    left breast/ 2008/ surg/ rad tx   Coronary artery disease    Diabetes mellitus    Past Surgical History:  Procedure Laterality Date   ABDOMINAL  HYSTERECTOMY     BREAST SURGERY     DEBRIDEMENT MANDIBLE N/A 02/22/2020   Procedure: INCISION AND DRAINAGE WITH DEBRIDEMENT MANDIBLE;  Surgeon: [name], DMD;  Location: [facility];  Service: Oral Surgery;  Laterality: N/A;   DEBRIDEMENT MANDIBLE Right 03/19/2021   Procedure: DEBRIDEMENT OF BONE RIGHT INTERIOR  MANDIBLE;  Surgeon: [name], DMD;  Location: [facility] OR;  Service: Oral Surgery;  Laterality: Right;   EYE SURGERY  2021   cataract removals    TOOTH EXTRACTION N/A 02/22/2020   Procedure: DENTAL RESTORATION/EXTRACTIONS;  Surgeon: [name], DMD;  Location: [facility];  Service: Oral Surgery;  Laterality: N/A;  DENTAL KIT REQUESTED    SOCIAL HISTORY:  Social History   Socioeconomic History   Marital status: Divorced    Spouse name: Not on file   Number of children: Not on file   Years of education: Not on file   Highest education level: Not on file  Occupational History   Not on file  Tobacco Use   Smoking status: Never   Smokeless tobacco: Never  Vaping Use   Vaping Use: Never used  Substance and Sexual Activity   Alcohol use: No   Drug use: No   Sexual activity: Yes    Birth control/protection: Surgical  Other Topics Concern   Not on file  Social History Narrative   Not on file   Social Determinants of Health   Financial Resource Strain: Low Risk    Difficulty of Paying Living Expenses: Not hard  at all  Food Insecurity: No Food Insecurity   Worried About Charity fundraiser in the Last Year: Never true   Ran Out of Food in the Last Year: Never true  Transportation Needs: No Transportation Needs   Lack of Transportation (Medical): No   Lack of Transportation (Non-Medical): No  Physical Activity: Inactive   Days of Exercise per Week: 0 days   Minutes of Exercise per Session: 0 min  Stress: No Stress Concern Present   Feeling of Stress : Not at all  Social Connections: Moderately Isolated   Frequency of Communication with Friends and Family: More than three  times a week   Frequency of Social Gatherings with Friends and Family: More than three times a week   Attends Religious Services: More than 4 times per year   Active Member of [employer] or Organizations: No   Attends Archivist Meetings: Never   Marital Status: Divorced  Human resources officer Violence: Not At Risk   Fear of Current or Ex-Partner: No   Emotionally Abused: No   Physically Abused: No   Sexually Abused: No    FAMILY HISTORY:  Family History  Problem Relation Age of Onset   Obesity Sister     CURRENT MEDICATIONS:  Current Outpatient Medications  Medication Sig Dispense Refill   acyclovir (ZOVIRAX) 400 MG tablet TAKE 1 TABLET BY MOUTH TWICE DAILY (Patient taking differently: Take 400 mg by mouth 2 (two) times daily.) 60 tablet 11   aspirin 81 MG tablet Take 81 mg by mouth daily.     bortezomib IV (VELCADE) 3.5 MG injection Inject 3.5 mg into the vein once a week. weekly     chlorhexidine (PERIDEX) 0.12 % solution Use as directed 15 mLs in the mouth or throat 3 (three) times daily.     dexamethasone (DECADRON) 4 MG tablet Take 10 tablets (40 mg) on days 1, 8, and 15 of chemo. Repeat every 21 days. (Patient taking differently: Take 4 mg by mouth See admin instructions. Take 10 tablets (40 mg) on days 1, 8, and 15 of chemo. Repeat every 21 days.) 30 tablet 3   glipiZIDE (GLUCOTROL) 5 MG tablet Take 5 mg by mouth daily before breakfast.      glucose blood (ACCU-CHEK AVIVA PLUS) test strip CHECK BLOOD SUGAR ONCE DAILY     lenalidomide (REVLIMID) 20 MG capsule Take 20 mg capsule by mouth once daily for 14 days on, and 7 days off of a 21 day cycle. 14 capsule 0   lisinopril-hydrochlorothiazide (PRINZIDE,ZESTORETIC) 20-25 MG tablet Take 1 tablet by mouth daily.     magnesium oxide (MAG-OX) 400 (241.3 Mg) MG tablet Take 1 tablet (400 mg total) by mouth in the morning, at noon, and at bedtime. 90 tablet 6   metFORMIN (GLUCOPHAGE) 1000 MG tablet Take 1,000 mg by mouth 2 (two) times  daily with a meal.     potassium chloride 20 MEQ/15ML (10%) SOLN Take 6 mEq by mouth 3 (three) times daily. 5 ml     potassium chloride SA (KLOR-CON) 20 MEQ tablet TAKE (2) TABLETS BY MOUTH THREE TIMES DAILY. 168 tablet 2   No current facility-administered medications for this visit.    ALLERGIES:  Allergies  Allergen Reactions   Seasonal Ic [Cholestatin] Other (See Comments)    Sneezing, watery eyes   Motrin [Ibuprofen] Rash    PHYSICAL EXAM:  Performance status (ECOG): 1 - Symptomatic but completely ambulatory  There were no vitals filed for this visit. Wt Readings  from Last 3 Encounters:  10/07/21 167 lb 6.4 oz (75.9 kg)  09/30/21 166 lb 12.8 oz (75.7 kg)  09/16/21 166 lb 6.4 oz (75.5 kg)   Physical Exam Vitals reviewed.  Constitutional:      Appearance: Normal appearance.  Cardiovascular:     Rate and Rhythm: Normal rate and regular rhythm.     Pulses: Normal pulses.     Heart sounds: Normal heart sounds.  Pulmonary:     Effort: Pulmonary effort is normal.     Breath sounds: Normal breath sounds.  Musculoskeletal:     Right lower leg: 1+ Edema present.     Left lower leg: 1+ Edema present.  Skin:    General: Skin is dry.  Neurological:     General: No focal deficit present.     Mental Status: She is alert and oriented to person, place, and time.  Psychiatric:        Mood and Affect: Mood normal.        Behavior: Behavior normal.    LABORATORY DATA:  I have reviewed the labs as listed.  CBC Latest Ref Rng & Units 10/14/2021 10/07/2021 09/30/2021  WBC 4.0 - 10.5 K/uL 3.2(L) 3.1(L) 3.6(L)  Hemoglobin 12.0 - 15.0 g/dL 10.5(L) 9.5(L) 9.4(L)  Hematocrit 36.0 - 46.0 % 32.2(L) 29.6(L) 28.4(L)  Platelets 150 - 400 K/uL 148(L) 139(L) 160   CMP Latest Ref Rng & Units 10/14/2021 10/07/2021 09/30/2021  Glucose 70 - 99 mg/dL 75 173(H) 137(H)  BUN 8 - 23 mg/dL _0 Creatinine 0.44 - 1.00 mg/dL 1.11(H) 1.18(H) 1.11(H)  Sodium 135 - 145 mmol/L 138 139 140  Potassium  3.5 - 5.1 mmol/L 2.5(LL) 2.5(LL) 3.2(L)  Chloride 98 - 111 mmol/L 104 103 106  CO2 22 - 32 mmol/L _1 Calcium 8.9 - 10.3 mg/dL 9.1 8.8(L) 8.9  Total Protein 6.5 - 8.1 g/dL 6.5 6.2(L) 6.2(L)  Total Bilirubin 0.3 - 1.2 mg/dL 0.9 0.8 0.9  Alkaline Phos 38 - 126 U/L 85 82 81  AST 15 - 41 U/L 11(L) 14(L) 13(L)  ALT 0 - 44 U/L _2 DIAGNOSTIC IMAGING:  I have independently reviewed the scans and discussed with the patient. No results found.   ASSESSMENT:  1.  IgA kappa plasma cell myeloma, stage I: -RVD started on 01/09/2018, held since 02/14/2020 due to mandible abscess. -Myeloma labs on 05/14/2020 showed progression with M spike of 0.5 g. -RVD started back on 05/21/2020. -Myeloma labs on 07/10/2020 shows M spike improved to 0.3 g from 0.6 g previously.  Free light chain ratio is 1.74 with kappa light chains 28.4. -Myeloma panel from 08/14/2020 shows M spike 0.4 g.  Kappa light chains are 24.8 and ratio is 2.23.   2.  Osteomyelitis of the right mandible/dental abscess: -Finished IV ceftriaxone on 04/03/2020.  Finished oral antibiotics. -We will hold Xgeva indefinitely.   PLAN:  1.  IgA kappa plasma cell myeloma, stage I: - We reviewed myeloma panel from 10/14/2021.  M spike is 0.2 g.  Free kappa light chains at 34.1 and ratio of 2.20. - Reviewed labs today which showed white count 3.6 with normal ANC.  Platelet count was normal.  Renal function was normal. - Continue Revlimid 20 mg 2 weeks on/1 week off. - Continue Velcade 3 weeks on/1 week off. - Continue dexamethasone 20 mg weekly. - RTC 6 weeks with myeloma labs 1 week prior.   2.  Severe hypokalemia: - Continue potassium  3 times daily at home. - Potassium today 3.1.  Will give potassium 40 mEq by mouth.   3.  Osteomyelitis of the right mandible/dental abscess: - Bisphosphonates were held indefinitely because of bone exposure on the right lower jaw.   4.  Hypomagnesemia: - Continue magnesium 3 times daily.  She is  likely not taking it on a regular basis.  Magnesium today is 1.2.  She will receive IV magnesium.   5.  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 - She received Feraheme recently. - Continue Retacrit as needed.  Hemoglobin today is 9.0. - We will plan to check anemia labs prior to next visit.   Orders placed this encounter:  No orders of the defined types were placed in this encounter.    Derek Jack, MD [place] [phone]   I, Thana Ates, am acting as a scribe for Dr. Derek Jack.  I, Derek Jack MD, have reviewed the above documentation for accuracy and completeness, and I agree with the above.

## 2021-10-28 ENCOUNTER — Inpatient Hospital Stay (HOSPITAL_COMMUNITY): Payer: Medicare Other

## 2021-10-28 ENCOUNTER — Inpatient Hospital Stay (HOSPITAL_BASED_OUTPATIENT_CLINIC_OR_DEPARTMENT_OTHER): Payer: Medicare Other | Admitting: Hematology

## 2021-10-28 ENCOUNTER — Other Ambulatory Visit (HOSPITAL_COMMUNITY): Payer: Self-pay

## 2021-10-28 ENCOUNTER — Other Ambulatory Visit: Payer: Self-pay

## 2021-10-28 ENCOUNTER — Inpatient Hospital Stay (HOSPITAL_COMMUNITY): Payer: Medicare Other | Attending: Hematology

## 2021-10-28 VITALS — BP 136/67 | HR 58 | Temp 98.4°F | Resp 18 | Wt 168.7 lb

## 2021-10-28 DIAGNOSIS — D649 Anemia, unspecified: Secondary | ICD-10-CM

## 2021-10-28 DIAGNOSIS — C9000 Multiple myeloma not having achieved remission: Secondary | ICD-10-CM

## 2021-10-28 DIAGNOSIS — E876 Hypokalemia: Secondary | ICD-10-CM | POA: Insufficient documentation

## 2021-10-28 DIAGNOSIS — N1832 Chronic kidney disease, stage 3b: Secondary | ICD-10-CM | POA: Diagnosis present

## 2021-10-28 DIAGNOSIS — D631 Anemia in chronic kidney disease: Secondary | ICD-10-CM | POA: Diagnosis not present

## 2021-10-28 DIAGNOSIS — Z5112 Encounter for antineoplastic immunotherapy: Secondary | ICD-10-CM | POA: Diagnosis present

## 2021-10-28 DIAGNOSIS — E538 Deficiency of other specified B group vitamins: Secondary | ICD-10-CM

## 2021-10-28 DIAGNOSIS — D520 Dietary folate deficiency anemia: Secondary | ICD-10-CM

## 2021-10-28 DIAGNOSIS — D513 Other dietary vitamin B12 deficiency anemia: Secondary | ICD-10-CM

## 2021-10-28 LAB — CBC WITH DIFFERENTIAL/PLATELET
Abs Immature Granulocytes: 0.01 10*3/uL (ref 0.00–0.07)
Basophils Absolute: 0.1 10*3/uL (ref 0.0–0.1)
Basophils Relative: 2 %
Eosinophils Absolute: 0.1 10*3/uL (ref 0.0–0.5)
Eosinophils Relative: 3 %
HCT: 26.7 % — ABNORMAL LOW (ref 36.0–46.0)
Hemoglobin: 9 g/dL — ABNORMAL LOW (ref 12.0–15.0)
Immature Granulocytes: 0 %
Lymphocytes Relative: 23 %
Lymphs Abs: 0.8 10*3/uL (ref 0.7–4.0)
MCH: 35.2 pg — ABNORMAL HIGH (ref 26.0–34.0)
MCHC: 33.7 g/dL (ref 30.0–36.0)
MCV: 104.3 fL — ABNORMAL HIGH (ref 80.0–100.0)
Monocytes Absolute: 0.4 10*3/uL (ref 0.1–1.0)
Monocytes Relative: 12 %
Neutro Abs: 2.2 10*3/uL (ref 1.7–7.7)
Neutrophils Relative %: 60 %
Platelets: 156 10*3/uL (ref 150–400)
RBC: 2.56 MIL/uL — ABNORMAL LOW (ref 3.87–5.11)
RDW: 15.9 % — ABNORMAL HIGH (ref 11.5–15.5)
WBC: 3.6 10*3/uL — ABNORMAL LOW (ref 4.0–10.5)
nRBC: 0 % (ref 0.0–0.2)

## 2021-10-28 LAB — COMPREHENSIVE METABOLIC PANEL
ALT: 10 U/L (ref 0–44)
AST: 13 U/L — ABNORMAL LOW (ref 15–41)
Albumin: 3.3 g/dL — ABNORMAL LOW (ref 3.5–5.0)
Alkaline Phosphatase: 79 U/L (ref 38–126)
Anion gap: 6 (ref 5–15)
BUN: 14 mg/dL (ref 8–23)
CO2: 25 mmol/L (ref 22–32)
Calcium: 9 mg/dL (ref 8.9–10.3)
Chloride: 110 mmol/L (ref 98–111)
Creatinine, Ser: 1.04 mg/dL — ABNORMAL HIGH (ref 0.44–1.00)
GFR, Estimated: 54 mL/min — ABNORMAL LOW (ref >60)
Glucose, Bld: 111 mg/dL — ABNORMAL HIGH (ref 70–99)
Potassium: 3.1 mmol/L — ABNORMAL LOW (ref 3.5–5.1)
Sodium: 141 mmol/L (ref 135–145)
Total Bilirubin: 0.9 mg/dL (ref 0.3–1.2)
Total Protein: 5.8 g/dL — ABNORMAL LOW (ref 6.5–8.1)

## 2021-10-28 LAB — MAGNESIUM: Magnesium: 1.2 mg/dL — ABNORMAL LOW (ref 1.7–2.4)

## 2021-10-28 MED ORDER — EPOETIN ALFA-EPBX 20000 UNIT/ML IJ SOLN
20000.0000 [IU] | Freq: Once | INTRAMUSCULAR | Status: AC
  Administered 2021-10-28: 20000 [IU] via SUBCUTANEOUS
  Filled 2021-10-28: qty 1

## 2021-10-28 MED ORDER — MAGNESIUM SULFATE 4 GM/100ML IV SOLN
4.0000 g | Freq: Once | INTRAVENOUS | Status: DC
Start: 2021-10-28 — End: 2021-10-28

## 2021-10-28 MED ORDER — MAGNESIUM SULFATE 2 GM/50ML IV SOLN
2.0000 g | INTRAVENOUS | Status: AC
  Administered 2021-10-28 (×2): 2 g via INTRAVENOUS
  Filled 2021-10-28 (×2): qty 50

## 2021-10-28 MED ORDER — BORTEZOMIB CHEMO SQ INJECTION 3.5 MG (2.5MG/ML)
1.3000 mg/m2 | Freq: Once | INTRAMUSCULAR | Status: AC
  Administered 2021-10-28: 2.25 mg via SUBCUTANEOUS
  Filled 2021-10-28: qty 0.9

## 2021-10-28 MED ORDER — DEXAMETHASONE 4 MG PO TABS
20.0000 mg | ORAL_TABLET | Freq: Once | ORAL | Status: AC
  Administered 2021-10-28: 20 mg via ORAL
  Filled 2021-10-28: qty 5

## 2021-10-28 MED ORDER — PROCHLORPERAZINE MALEATE 10 MG PO TABS
10.0000 mg | ORAL_TABLET | Freq: Once | ORAL | Status: AC
  Administered 2021-10-28: 10 mg via ORAL
  Filled 2021-10-28: qty 1

## 2021-10-28 MED ORDER — POTASSIUM CHLORIDE CRYS ER 20 MEQ PO TBCR
40.0000 meq | EXTENDED_RELEASE_TABLET | Freq: Once | ORAL | Status: AC
  Administered 2021-10-28: 40 meq via ORAL
  Filled 2021-10-28: qty 2

## 2021-10-28 MED ORDER — LENALIDOMIDE 20 MG PO CAPS: ORAL_CAPSULE | ORAL | 0 refills | Status: DC

## 2021-10-28 MED ORDER — SODIUM CHLORIDE 0.9 % IV SOLN: Freq: Once | INTRAVENOUS | Status: AC

## 2021-10-28 NOTE — Patient Instructions (Addendum)
[place] at [facility] Discharge Instructions  You were seen and examined today by Dr. Delton Coombes. He reviewed your most recent labs and your magnesium and potassium is low he is ordering IV magnesium and oral potassium to be given today in office. Please keep follow up as scheduled.   Thank you for choosing [place] at [facility] to provide your oncology and hematology care.  To afford each patient quality time with our provider, please arrive at least 15 minutes before your scheduled appointment time.   If you have a lab appointment with the [facility] please come in thru the Main Entrance and check in at the main information desk.  You need to re-schedule your appointment should you arrive 10 or more minutes late.  We strive to give you quality time with our providers, and arriving late affects you and other patients whose appointments are after yours.  Also, if you no show three or more times for appointments you may be dismissed from the clinic at the providers discretion.     Again, thank you for choosing [facility].  Our hope is that these requests will decrease the amount of time that you wait before being seen by our physicians.       _____________________________________________________________  Should you have questions after your visit to [facility], please contact our office at [phone] and follow the prompts.  Our office hours are 8:00 a.m. and 4:30 p.m. Monday - Friday.  Please note that voicemails left after 4:00 p.m. may not be returned until the following business day.  We are closed weekends and major holidays.  You do have access to a nurse 24-7, just call the main number to the clinic [phone] and do not press any options, hold on the line and a nurse will answer the phone.    For prescription refill requests, have your pharmacy contact our office and allow 72 hours.    Due to Covid,  you will need to wear a mask upon entering the hospital. If you do not have a mask, a mask will be given to you at the Main Entrance upon arrival. For doctor visits, patients may have 1 support person age 43 or older with them. For treatment visits, patients can not have anyone with them due to social distancing guidelines and our immunocompromised population.

## 2021-10-28 NOTE — Telephone Encounter (Signed)
Chart reviewed. Revlimid refilled per last office note with Dr. Katragadda.  

## 2021-10-28 NOTE — Progress Notes (Signed)
Patient has been assessed, vital signs and labs have been reviewed by Dr. Delton Coombes. ANC, Creatinine, LFTs, and Platelets are within treatment parameters per Dr. Delton Coombes. The patient is good to proceed with Velcade treatment at this time.  Dr. Delton Coombes is also ordering Magnesium 4 g IV and Potassium 40 meq oral today. Primary RN and pharmacy aware.

## 2021-10-28 NOTE — Patient Instructions (Signed)
[place]  Discharge Instructions: Thank you for choosing [place] to provide your oncology and hematology care.  If you have a lab appointment with the [facility], please come in thru the Main Entrance and check in at the main information desk.  Wear comfortable clothing and clothing appropriate for easy access to any Portacath or PICC line.   We strive to give you quality time with your provider. You may need to reschedule your appointment if you arrive late (15 or more minutes).  Arriving late affects you and other patients whose appointments are after yours.  Also, if you miss three or more appointments without notifying the office, you may be dismissed from the clinic at the provider's discretion.      For prescription refill requests, have your pharmacy contact our office and allow 72 hours for refills to be completed.    Today you received the following Velcade and Retacrit injections. Your magnesium was 1.2 so you received 4g of IV Magnesium. Your potassium was 3.1, and you received 40 mEq of PO Potassium. Return as scheduled.   To help prevent nausea and vomiting after your treatment, we encourage you to take your nausea medication as directed.  BELOW ARE SYMPTOMS THAT SHOULD BE REPORTED IMMEDIATELY: *FEVER GREATER THAN 100.4 F (38 C) OR HIGHER *CHILLS OR SWEATING *NAUSEA AND VOMITING THAT IS NOT CONTROLLED WITH YOUR NAUSEA MEDICATION *UNUSUAL SHORTNESS OF BREATH *UNUSUAL BRUISING OR BLEEDING *URINARY PROBLEMS (pain or burning when urinating, or frequent urination) *BOWEL PROBLEMS (unusual diarrhea, constipation, pain near the anus) TENDERNESS IN MOUTH AND THROAT WITH OR WITHOUT PRESENCE OF ULCERS (sore throat, sores in mouth, or a toothache) UNUSUAL RASH, SWELLING OR PAIN  UNUSUAL VAGINAL DISCHARGE OR ITCHING   Items with * indicate a potential emergency and should be followed up as soon as possible or go to the Emergency Department if any  problems should occur.  Please show the CHEMOTHERAPY ALERT CARD or IMMUNOTHERAPY ALERT CARD at check-in to the Emergency Department and triage nurse.  Should you have questions after your visit or need to cancel or reschedule your appointment, please contact [facility] [phone]  and follow the prompts.  Office hours are 8:00 a.m. to 4:30 p.m. Monday - Friday. Please note that voicemails left after 4:00 p.m. may not be returned until the following business day.  We are closed weekends and major holidays. You have access to a nurse at all times for urgent questions. Please call the main number to the clinic [phone] and follow the prompts.  For any non-urgent questions, you may also contact your provider using MyChart. We now offer e-Visits for anyone 73 and older to request care online for non-urgent symptoms. For details visit [email or web address].   Also download the MyChart app! Go to the app store, search "MyChart", open the app, select [place], and log in with your MyChart username and password.  Due to Covid, a mask is required upon entering the hospital/clinic. If you do not have a mask, one will be given to you upon arrival. For doctor visits, patients may have 1 support person aged 64 or older with them. For treatment visits, patients cannot have anyone with them due to current Covid guidelines and our immunocompromised population.

## 2021-10-28 NOTE — Progress Notes (Signed)
Patients presents today for Velcade and Retacrit. Magnesium 1.2 & Potassium 3.1. Patient's labs and patient assessed today by Dr. Redge Gainer. Patient okay for treatment today per Dr. Delton Coombes. RN received additional orders from Dr. Delton Coombes for patient to receive 4g of Magnesium IV and 40 meg of PO potassium. Orders placed. Patient presents today for Retacrit injection. Hemoglobin reviewed prior to administration. VSS tolerated without incident or complaint. See MAR for details. Patient stable during and after injection. Patient tolerated Velcade injection with no complaints voiced. Lab work reviewed. See MAR for details. Injection site clean and dry with no bruising or swelling noted. Patient stable during and after injection. Band aid applied.   Patient tolerated Magnesium infusion with no complaints voiced. Peripheral IV site clean and dry with good blood return noted before and after infusion. Band aid applied. VSS with discharge and left in satisfactory condition with no s/s of distress noted.

## 2021-10-29 ENCOUNTER — Encounter (HOSPITAL_COMMUNITY): Payer: Self-pay | Admitting: Adult Health

## 2021-10-29 ENCOUNTER — Encounter (HOSPITAL_COMMUNITY): Payer: Self-pay | Admitting: Hematology

## 2021-11-04 ENCOUNTER — Encounter (HOSPITAL_COMMUNITY): Payer: Self-pay

## 2021-11-04 ENCOUNTER — Inpatient Hospital Stay (HOSPITAL_COMMUNITY): Payer: Medicare Other

## 2021-11-04 ENCOUNTER — Other Ambulatory Visit: Payer: Self-pay

## 2021-11-04 VITALS — BP 135/53 | HR 63 | Temp 97.0°F | Resp 18

## 2021-11-04 DIAGNOSIS — E876 Hypokalemia: Secondary | ICD-10-CM

## 2021-11-04 DIAGNOSIS — C9000 Multiple myeloma not having achieved remission: Secondary | ICD-10-CM

## 2021-11-04 DIAGNOSIS — Z5112 Encounter for antineoplastic immunotherapy: Secondary | ICD-10-CM | POA: Diagnosis not present

## 2021-11-04 LAB — LACTATE DEHYDROGENASE: LDH: 126 U/L (ref 98–192)

## 2021-11-04 LAB — COMPREHENSIVE METABOLIC PANEL
ALT: 9 U/L (ref 0–44)
AST: 11 U/L — ABNORMAL LOW (ref 15–41)
Albumin: 3.6 g/dL (ref 3.5–5.0)
Alkaline Phosphatase: 93 U/L (ref 38–126)
Anion gap: 7 (ref 5–15)
BUN: 22 mg/dL (ref 8–23)
CO2: 26 mmol/L (ref 22–32)
Calcium: 9.7 mg/dL (ref 8.9–10.3)
Chloride: 105 mmol/L (ref 98–111)
Creatinine, Ser: 1.04 mg/dL — ABNORMAL HIGH (ref 0.44–1.00)
GFR, Estimated: 54 mL/min — ABNORMAL LOW (ref >60)
Glucose, Bld: 101 mg/dL — ABNORMAL HIGH (ref 70–99)
Potassium: 2.9 mmol/L — ABNORMAL LOW (ref 3.5–5.1)
Sodium: 138 mmol/L (ref 135–145)
Total Bilirubin: 0.9 mg/dL (ref 0.3–1.2)
Total Protein: 6.4 g/dL — ABNORMAL LOW (ref 6.5–8.1)

## 2021-11-04 LAB — CBC WITH DIFFERENTIAL/PLATELET
Abs Immature Granulocytes: 0.01 10*3/uL (ref 0.00–0.07)
Basophils Absolute: 0 10*3/uL (ref 0.0–0.1)
Basophils Relative: 1 %
Eosinophils Absolute: 0.2 10*3/uL (ref 0.0–0.5)
Eosinophils Relative: 7 %
HCT: 31.5 % — ABNORMAL LOW (ref 36.0–46.0)
Hemoglobin: 10 g/dL — ABNORMAL LOW (ref 12.0–15.0)
Immature Granulocytes: 0 %
Lymphocytes Relative: 27 %
Lymphs Abs: 0.8 10*3/uL (ref 0.7–4.0)
MCH: 34.1 pg — ABNORMAL HIGH (ref 26.0–34.0)
MCHC: 31.7 g/dL (ref 30.0–36.0)
MCV: 107.5 fL — ABNORMAL HIGH (ref 80.0–100.0)
Monocytes Absolute: 0.2 10*3/uL (ref 0.1–1.0)
Monocytes Relative: 8 %
Neutro Abs: 1.7 10*3/uL (ref 1.7–7.7)
Neutrophils Relative %: 57 %
Platelets: 153 10*3/uL (ref 150–400)
RBC: 2.93 MIL/uL — ABNORMAL LOW (ref 3.87–5.11)
RDW: 17.1 % — ABNORMAL HIGH (ref 11.5–15.5)
WBC: 2.9 10*3/uL — ABNORMAL LOW (ref 4.0–10.5)
nRBC: 0 % (ref 0.0–0.2)

## 2021-11-04 LAB — MAGNESIUM: Magnesium: 1.5 mg/dL — ABNORMAL LOW (ref 1.7–2.4)

## 2021-11-04 MED ORDER — PROCHLORPERAZINE MALEATE 10 MG PO TABS
10.0000 mg | ORAL_TABLET | Freq: Once | ORAL | Status: AC
  Administered 2021-11-04: 10 mg via ORAL
  Filled 2021-11-04: qty 1

## 2021-11-04 MED ORDER — DEXAMETHASONE 4 MG PO TABS
20.0000 mg | ORAL_TABLET | Freq: Once | ORAL | Status: AC
  Administered 2021-11-04: 20 mg via ORAL
  Filled 2021-11-04: qty 5

## 2021-11-04 MED ORDER — BORTEZOMIB CHEMO SQ INJECTION 3.5 MG (2.5MG/ML)
1.3000 mg/m2 | Freq: Once | INTRAMUSCULAR | Status: AC
  Administered 2021-11-04: 2.25 mg via SUBCUTANEOUS
  Filled 2021-11-04: qty 0.9

## 2021-11-04 MED ORDER — MAGNESIUM SULFATE 2 GM/50ML IV SOLN: 2.0000 g | Freq: Once | INTRAVENOUS | Status: DC

## 2021-11-04 MED ORDER — POTASSIUM CHLORIDE CRYS ER 20 MEQ PO TBCR
40.0000 meq | EXTENDED_RELEASE_TABLET | Freq: Once | ORAL | Status: AC
  Administered 2021-11-04: 40 meq via ORAL
  Filled 2021-11-04: qty 2

## 2021-11-04 MED ORDER — POTASSIUM CHLORIDE CRYS ER 20 MEQ PO TBCR: 40.0000 meq | EXTENDED_RELEASE_TABLET | Freq: Once | ORAL | Status: DC

## 2021-11-04 MED ORDER — MAGNESIUM SULFATE 2 GM/50ML IV SOLN
2.0000 g | Freq: Once | INTRAVENOUS | Status: AC
  Administered 2021-11-04: 2 g via INTRAVENOUS
  Filled 2021-11-04: qty 50

## 2021-11-04 NOTE — Progress Notes (Signed)
Labs meet parameters for treatment . Per standing orders will give Potassium 71meq PO and Mag 2 gm IV for a K+ of 2.9 and a mag of 1.5  Velcade injection given per orders. No retacrit today due to parameters. Patient tolerated it well without problems. Vitals stable and discharged home from clinic ambulatory. Follow up as scheduled.

## 2021-11-11 ENCOUNTER — Inpatient Hospital Stay (HOSPITAL_COMMUNITY): Payer: Medicare Other

## 2021-11-11 ENCOUNTER — Other Ambulatory Visit: Payer: Self-pay

## 2021-11-11 VITALS — BP 132/63 | HR 60 | Temp 97.0°F | Resp 18 | Wt 160.2 lb

## 2021-11-11 DIAGNOSIS — C9000 Multiple myeloma not having achieved remission: Secondary | ICD-10-CM

## 2021-11-11 DIAGNOSIS — E538 Deficiency of other specified B group vitamins: Secondary | ICD-10-CM

## 2021-11-11 DIAGNOSIS — Z5112 Encounter for antineoplastic immunotherapy: Secondary | ICD-10-CM | POA: Diagnosis not present

## 2021-11-11 LAB — CBC WITH DIFFERENTIAL/PLATELET
Abs Immature Granulocytes: 0.03 10*3/uL (ref 0.00–0.07)
Basophils Absolute: 0 10*3/uL (ref 0.0–0.1)
Basophils Relative: 1 %
Eosinophils Absolute: 0.1 10*3/uL (ref 0.0–0.5)
Eosinophils Relative: 3 %
HCT: 34.1 % — ABNORMAL LOW (ref 36.0–46.0)
Hemoglobin: 11.2 g/dL — ABNORMAL LOW (ref 12.0–15.0)
Immature Granulocytes: 1 %
Lymphocytes Relative: 18 %
Lymphs Abs: 0.7 10*3/uL (ref 0.7–4.0)
MCH: 33.9 pg (ref 26.0–34.0)
MCHC: 32.8 g/dL (ref 30.0–36.0)
MCV: 103.3 fL — ABNORMAL HIGH (ref 80.0–100.0)
Monocytes Absolute: 0.5 10*3/uL (ref 0.1–1.0)
Monocytes Relative: 12 %
Neutro Abs: 2.7 10*3/uL (ref 1.7–7.7)
Neutrophils Relative %: 65 %
Platelets: 137 10*3/uL — ABNORMAL LOW (ref 150–400)
RBC: 3.3 MIL/uL — ABNORMAL LOW (ref 3.87–5.11)
RDW: 16.6 % — ABNORMAL HIGH (ref 11.5–15.5)
WBC: 4.1 10*3/uL (ref 4.0–10.5)
nRBC: 0 % (ref 0.0–0.2)

## 2021-11-11 LAB — COMPREHENSIVE METABOLIC PANEL
ALT: 7 U/L (ref 0–44)
AST: 11 U/L — ABNORMAL LOW (ref 15–41)
Albumin: 3.9 g/dL (ref 3.5–5.0)
Alkaline Phosphatase: 106 U/L (ref 38–126)
Anion gap: 7 (ref 5–15)
BUN: 14 mg/dL (ref 8–23)
CO2: 28 mmol/L (ref 22–32)
Calcium: 10 mg/dL (ref 8.9–10.3)
Chloride: 105 mmol/L (ref 98–111)
Creatinine, Ser: 1.05 mg/dL — ABNORMAL HIGH (ref 0.44–1.00)
GFR, Estimated: 53 mL/min — ABNORMAL LOW (ref >60)
Glucose, Bld: 111 mg/dL — ABNORMAL HIGH (ref 70–99)
Potassium: 2.9 mmol/L — ABNORMAL LOW (ref 3.5–5.1)
Sodium: 140 mmol/L (ref 135–145)
Total Bilirubin: 1.8 mg/dL — ABNORMAL HIGH (ref 0.3–1.2)
Total Protein: 6.9 g/dL (ref 6.5–8.1)

## 2021-11-11 LAB — LACTATE DEHYDROGENASE: LDH: 118 U/L (ref 98–192)

## 2021-11-11 LAB — MAGNESIUM: Magnesium: 1.5 mg/dL — ABNORMAL LOW (ref 1.7–2.4)

## 2021-11-11 MED ORDER — MAGNESIUM SULFATE 2 GM/50ML IV SOLN
2.0000 g | Freq: Once | INTRAVENOUS | Status: AC
  Administered 2021-11-11: 2 g via INTRAVENOUS
  Filled 2021-11-11: qty 50

## 2021-11-11 MED ORDER — PROCHLORPERAZINE MALEATE 10 MG PO TABS
10.0000 mg | ORAL_TABLET | Freq: Once | ORAL | Status: AC
  Administered 2021-11-11: 10 mg via ORAL
  Filled 2021-11-11: qty 1

## 2021-11-11 MED ORDER — BORTEZOMIB CHEMO SQ INJECTION 3.5 MG (2.5MG/ML)
1.3000 mg/m2 | Freq: Once | INTRAMUSCULAR | Status: AC
  Administered 2021-11-11: 2.25 mg via SUBCUTANEOUS
  Filled 2021-11-11: qty 0.9

## 2021-11-11 MED ORDER — DEXAMETHASONE 4 MG PO TABS
20.0000 mg | ORAL_TABLET | Freq: Once | ORAL | Status: AC
  Administered 2021-11-11: 20 mg via ORAL
  Filled 2021-11-11: qty 5

## 2021-11-11 MED ORDER — SODIUM CHLORIDE 0.9 % IV SOLN: INTRAVENOUS | Status: DC

## 2021-11-11 MED ORDER — POTASSIUM CHLORIDE CRYS ER 20 MEQ PO TBCR
40.0000 meq | EXTENDED_RELEASE_TABLET | Freq: Once | ORAL | Status: AC
  Administered 2021-11-11: 40 meq via ORAL
  Filled 2021-11-11: qty 2

## 2021-11-11 MED ORDER — CYANOCOBALAMIN 1000 MCG/ML IJ SOLN
1000.0000 ug | Freq: Once | INTRAMUSCULAR | Status: AC
  Administered 2021-11-11: 1000 ug via INTRAMUSCULAR
  Filled 2021-11-11: qty 1

## 2021-11-11 NOTE — Patient Instructions (Signed)
[place]  Discharge Instructions: Thank you for choosing [facility] to provide your oncology and hematology care.  If you have a lab appointment with the [facility], please come in thru the Main Entrance and check in at the main information desk.  Wear comfortable clothing and clothing appropriate for easy access to any Portacath or PICC line.   We strive to give you quality time with your provider. You may need to reschedule your appointment if you arrive late (15 or more minutes).  Arriving late affects you and other patients whose appointments are after yours.  Also, if you miss three or more appointments without notifying the office, you may be dismissed from the clinic at the provider's discretion.      For prescription refill requests, have your pharmacy contact our office and allow 72 hours for refills to be completed.    Today you received the following chemotherapy and/or immunotherapy agents Velcade B12 Magnesium Potassium      To help prevent nausea and vomiting after your treatment, we encourage you to take your nausea medication as directed.  BELOW ARE SYMPTOMS THAT SHOULD BE REPORTED IMMEDIATELY: *FEVER GREATER THAN 100.4 F (38 C) OR HIGHER *CHILLS OR SWEATING *NAUSEA AND VOMITING THAT IS NOT CONTROLLED WITH YOUR NAUSEA MEDICATION *UNUSUAL SHORTNESS OF BREATH *UNUSUAL BRUISING OR BLEEDING *URINARY PROBLEMS (pain or burning when urinating, or frequent urination) *BOWEL PROBLEMS (unusual diarrhea, constipation, pain near the anus) TENDERNESS IN MOUTH AND THROAT WITH OR WITHOUT PRESENCE OF ULCERS (sore throat, sores in mouth, or a toothache) UNUSUAL RASH, SWELLING OR PAIN  UNUSUAL VAGINAL DISCHARGE OR ITCHING   Items with * indicate a potential emergency and should be followed up as soon as possible or go to the Emergency Department if any problems should occur.  Please show the CHEMOTHERAPY ALERT CARD or IMMUNOTHERAPY ALERT CARD at check-in  to the Emergency Department and triage nurse.  Should you have questions after your visit or need to cancel or reschedule your appointment, please contact [facility] [phone]  and follow the prompts.  Office hours are 8:00 a.m. to 4:30 p.m. Monday - Friday. Please note that voicemails left after 4:00 p.m. may not be returned until the following business day.  We are closed weekends and major holidays. You have access to a nurse at all times for urgent questions. Please call the main number to the clinic [phone] and follow the prompts.  For any non-urgent questions, you may also contact your provider using MyChart. We now offer e-Visits for anyone 70 and older to request care online for non-urgent symptoms. For details visit [email or web address].   Also download the MyChart app! Go to the app store, search "MyChart", open the app, select [place], and log in with your MyChart username and password.  Due to Covid, a mask is required upon entering the hospital/clinic. If you do not have a mask, one will be given to you upon arrival. For doctor visits, patients may have 1 support person aged 69 or older with them. For treatment visits, patients cannot have anyone with them due to current Covid guidelines and our immunocompromised population.

## 2021-11-11 NOTE — Progress Notes (Signed)
Patient presents today for Velcade, B12 and Retacrit per providers order.  Retacrit held today per parameters for Hgb of 11.2, Magnesium 2 grams IV and Potassium 40 mEq PO given today for Mag of 1.5 and Potassium of 2.9.    Peripheral IV started and blood return noted pre and post infusion.   B12 and Velcade administration without incident; injection site WNL; see MAR for injection details.  Patient tolerated procedure well and without incident.  No questions or complaints noted at this time. Magnesium given today per MD orders.  Tolerated infusion without adverse affects.  Vital signs stable.  No complaints at this time.  Discharge from clinic ambulatory in stable condition.  Alert and oriented X 3.  Follow up with [facility] as scheduled.

## 2021-11-24 ENCOUNTER — Other Ambulatory Visit (HOSPITAL_COMMUNITY): Payer: Self-pay

## 2021-11-24 DIAGNOSIS — C9000 Multiple myeloma not having achieved remission: Secondary | ICD-10-CM

## 2021-11-24 MED ORDER — LENALIDOMIDE 20 MG PO CAPS: ORAL_CAPSULE | ORAL | 0 refills | Status: DC

## 2021-11-24 NOTE — Telephone Encounter (Signed)
Chart reviewed. Revlimid refilled per last office note with Dr. Katragadda.  

## 2021-11-25 ENCOUNTER — Inpatient Hospital Stay (HOSPITAL_COMMUNITY): Payer: Medicare Other

## 2021-11-25 ENCOUNTER — Other Ambulatory Visit: Payer: Self-pay

## 2021-11-25 VITALS — BP 115/65 | HR 66 | Temp 97.8°F | Resp 18 | Wt 160.8 lb

## 2021-11-25 DIAGNOSIS — Z5112 Encounter for antineoplastic immunotherapy: Secondary | ICD-10-CM | POA: Diagnosis not present

## 2021-11-25 DIAGNOSIS — C9000 Multiple myeloma not having achieved remission: Secondary | ICD-10-CM

## 2021-11-25 DIAGNOSIS — E876 Hypokalemia: Secondary | ICD-10-CM

## 2021-11-25 DIAGNOSIS — E538 Deficiency of other specified B group vitamins: Secondary | ICD-10-CM

## 2021-11-25 LAB — CBC WITH DIFFERENTIAL/PLATELET
Abs Immature Granulocytes: 0.01 10*3/uL (ref 0.00–0.07)
Basophils Absolute: 0 10*3/uL (ref 0.0–0.1)
Basophils Relative: 1 %
Eosinophils Absolute: 0.2 10*3/uL (ref 0.0–0.5)
Eosinophils Relative: 7 %
HCT: 28.9 % — ABNORMAL LOW (ref 36.0–46.0)
Hemoglobin: 9.8 g/dL — ABNORMAL LOW (ref 12.0–15.0)
Immature Granulocytes: 0 %
Lymphocytes Relative: 15 %
Lymphs Abs: 0.5 10*3/uL — ABNORMAL LOW (ref 0.7–4.0)
MCH: 34.9 pg — ABNORMAL HIGH (ref 26.0–34.0)
MCHC: 33.9 g/dL (ref 30.0–36.0)
MCV: 102.8 fL — ABNORMAL HIGH (ref 80.0–100.0)
Monocytes Absolute: 0.3 10*3/uL (ref 0.1–1.0)
Monocytes Relative: 9 %
Neutro Abs: 2.1 10*3/uL (ref 1.7–7.7)
Neutrophils Relative %: 68 %
Platelets: 168 10*3/uL (ref 150–400)
RBC: 2.81 MIL/uL — ABNORMAL LOW (ref 3.87–5.11)
RDW: 15.6 % — ABNORMAL HIGH (ref 11.5–15.5)
WBC: 3.1 10*3/uL — ABNORMAL LOW (ref 4.0–10.5)
nRBC: 0 % (ref 0.0–0.2)

## 2021-11-25 LAB — COMPREHENSIVE METABOLIC PANEL
ALT: 7 U/L (ref 0–44)
AST: 12 U/L — ABNORMAL LOW (ref 15–41)
Albumin: 3.5 g/dL (ref 3.5–5.0)
Alkaline Phosphatase: 88 U/L (ref 38–126)
Anion gap: 8 (ref 5–15)
BUN: 20 mg/dL (ref 8–23)
CO2: 26 mmol/L (ref 22–32)
Calcium: 9.5 mg/dL (ref 8.9–10.3)
Chloride: 104 mmol/L (ref 98–111)
Creatinine, Ser: 1.19 mg/dL — ABNORMAL HIGH (ref 0.44–1.00)
GFR, Estimated: 46 mL/min — ABNORMAL LOW (ref >60)
Glucose, Bld: 157 mg/dL — ABNORMAL HIGH (ref 70–99)
Potassium: 2.8 mmol/L — ABNORMAL LOW (ref 3.5–5.1)
Sodium: 138 mmol/L (ref 135–145)
Total Bilirubin: 1.4 mg/dL — ABNORMAL HIGH (ref 0.3–1.2)
Total Protein: 6.2 g/dL — ABNORMAL LOW (ref 6.5–8.1)

## 2021-11-25 LAB — MAGNESIUM: Magnesium: 1.2 mg/dL — ABNORMAL LOW (ref 1.7–2.4)

## 2021-11-25 MED ORDER — BORTEZOMIB CHEMO SQ INJECTION 3.5 MG (2.5MG/ML)
1.3000 mg/m2 | Freq: Once | INTRAMUSCULAR | Status: AC
  Administered 2021-11-25: 2.25 mg via SUBCUTANEOUS
  Filled 2021-11-25: qty 0.9

## 2021-11-25 MED ORDER — PROCHLORPERAZINE MALEATE 10 MG PO TABS
10.0000 mg | ORAL_TABLET | Freq: Once | ORAL | Status: AC
  Administered 2021-11-25: 10 mg via ORAL

## 2021-11-25 MED ORDER — SODIUM CHLORIDE 0.9 % IV SOLN: INTRAVENOUS | Status: DC

## 2021-11-25 MED ORDER — POTASSIUM CHLORIDE CRYS ER 20 MEQ PO TBCR
40.0000 meq | EXTENDED_RELEASE_TABLET | ORAL | Status: AC
  Administered 2021-11-25 (×2): 40 meq via ORAL

## 2021-11-25 MED ORDER — MAGNESIUM SULFATE 2 GM/50ML IV SOLN
2.0000 g | INTRAVENOUS | Status: AC
  Administered 2021-11-25 (×2): 2 g via INTRAVENOUS

## 2021-11-25 MED ORDER — EPOETIN ALFA-EPBX 20000 UNIT/ML IJ SOLN
20000.0000 [IU] | Freq: Once | INTRAMUSCULAR | Status: AC
  Administered 2021-11-25: 20000 [IU] via SUBCUTANEOUS

## 2021-11-25 MED ORDER — POTASSIUM CHLORIDE 10 MEQ/100ML IV SOLN
10.0000 meq | INTRAVENOUS | Status: AC
  Administered 2021-11-25 (×2): 10 meq via INTRAVENOUS

## 2021-11-25 MED ORDER — DEXAMETHASONE 4 MG PO TABS
20.0000 mg | ORAL_TABLET | Freq: Once | ORAL | Status: AC
  Administered 2021-11-25: 20 mg via ORAL

## 2021-11-25 NOTE — Progress Notes (Signed)
Patient presents today for Velcade and Retacrit per providers order.  Vital signs and labs within parameters for treatment.  Magnesium noted to be 1.2 and Potassium 2.8.  Patient will receive 4 grams of magnesium IV and 68mEq K PO, 64mEq K IV and 40Meq K PO before discharge.  Peripheral IV started and blood return noted pre and post infusion.  Treatment given today per MD orders.  Stable during infusions and injection without adverse affects.  Injection site WNL; see MAR for injection details.  Vital signs stable.  No complaints at this time.  Discharge from clinic ambulatory in stable condition.  Alert and oriented X 3.  Follow up with [facility] as scheduled.

## 2021-11-25 NOTE — Patient Instructions (Signed)
[facility]  Discharge Instructions: Thank you for choosing [facility] to provide your oncology and hematology care.  If you have a lab appointment with the [facility], please come in thru the Main Entrance and check in at the main information desk.  Wear comfortable clothing and clothing appropriate for easy access to any Portacath or PICC line.   We strive to give you quality time with your provider. You may need to reschedule your appointment if you arrive late (15 or more minutes).  Arriving late affects you and other patients whose appointments are after yours.  Also, if you miss three or more appointments without notifying the office, you may be dismissed from the clinic at the provider's discretion.      For prescription refill requests, have your pharmacy contact our office and allow 72 hours for refills to be completed.    Today you received the following chemotherapy and/or immunotherapy agents Retacrit, Velcade, potassium, Magnesium      To help prevent nausea and vomiting after your treatment, we encourage you to take your nausea medication as directed.  BELOW ARE SYMPTOMS THAT SHOULD BE REPORTED IMMEDIATELY: *FEVER GREATER THAN 100.4 F (38 C) OR HIGHER *CHILLS OR SWEATING *NAUSEA AND VOMITING THAT IS NOT CONTROLLED WITH YOUR NAUSEA MEDICATION *UNUSUAL SHORTNESS OF BREATH *UNUSUAL BRUISING OR BLEEDING *URINARY PROBLEMS (pain or burning when urinating, or frequent urination) *BOWEL PROBLEMS (unusual diarrhea, constipation, pain near the anus) TENDERNESS IN MOUTH AND THROAT WITH OR WITHOUT PRESENCE OF ULCERS (sore throat, sores in mouth, or a toothache) UNUSUAL RASH, SWELLING OR PAIN  UNUSUAL VAGINAL DISCHARGE OR ITCHING   Items with * indicate a potential emergency and should be followed up as soon as possible or go to the Emergency Department if any problems should occur.  Please show the CHEMOTHERAPY ALERT CARD or IMMUNOTHERAPY ALERT CARD at  check-in to the Emergency Department and triage nurse.  Should you have questions after your visit or need to cancel or reschedule your appointment, please contact [facility] [phone]  and follow the prompts.  Office hours are 8:00 a.m. to 4:30 p.m. Monday - Friday. Please note that voicemails left after 4:00 p.m. may not be returned until the following business day.  We are closed weekends and major holidays. You have access to a nurse at all times for urgent questions. Please call the main number to the clinic [phone] and follow the prompts.  For any non-urgent questions, you may also contact your provider using MyChart. We now offer e-Visits for anyone 48 and older to request care online for non-urgent symptoms. For details visit [email or web address].   Also download the MyChart app! Go to the app store, search "MyChart", open the app, select [facility], and log in with your MyChart username and password.  Due to Covid, a mask is required upon entering the hospital/clinic. If you do not have a mask, one will be given to you upon arrival. For doctor visits, patients may have 1 support person aged 74 or older with them. For treatment visits, patients cannot have anyone with them due to current Covid guidelines and our immunocompromised population.

## 2021-12-02 ENCOUNTER — Inpatient Hospital Stay (HOSPITAL_COMMUNITY): Payer: Medicare Other

## 2021-12-02 ENCOUNTER — Other Ambulatory Visit: Payer: Self-pay

## 2021-12-02 ENCOUNTER — Inpatient Hospital Stay (HOSPITAL_COMMUNITY): Payer: Medicare Other | Attending: Hematology

## 2021-12-02 DIAGNOSIS — D631 Anemia in chronic kidney disease: Secondary | ICD-10-CM | POA: Insufficient documentation

## 2021-12-02 DIAGNOSIS — N1832 Chronic kidney disease, stage 3b: Secondary | ICD-10-CM | POA: Diagnosis present

## 2021-12-02 DIAGNOSIS — E876 Hypokalemia: Secondary | ICD-10-CM

## 2021-12-02 DIAGNOSIS — Z5112 Encounter for antineoplastic immunotherapy: Secondary | ICD-10-CM | POA: Insufficient documentation

## 2021-12-02 DIAGNOSIS — E538 Deficiency of other specified B group vitamins: Secondary | ICD-10-CM

## 2021-12-02 DIAGNOSIS — C9000 Multiple myeloma not having achieved remission: Secondary | ICD-10-CM

## 2021-12-02 LAB — CBC WITH DIFFERENTIAL/PLATELET
Abs Immature Granulocytes: 0.01 10*3/uL (ref 0.00–0.07)
Basophils Absolute: 0 10*3/uL (ref 0.0–0.1)
Basophils Relative: 1 %
Eosinophils Absolute: 0.2 10*3/uL (ref 0.0–0.5)
Eosinophils Relative: 5 %
HCT: 29.2 % — ABNORMAL LOW (ref 36.0–46.0)
Hemoglobin: 10 g/dL — ABNORMAL LOW (ref 12.0–15.0)
Immature Granulocytes: 0 %
Lymphocytes Relative: 22 %
Lymphs Abs: 0.7 10*3/uL (ref 0.7–4.0)
MCH: 35.7 pg — ABNORMAL HIGH (ref 26.0–34.0)
MCHC: 34.2 g/dL (ref 30.0–36.0)
MCV: 104.3 fL — ABNORMAL HIGH (ref 80.0–100.0)
Monocytes Absolute: 0.3 10*3/uL (ref 0.1–1.0)
Monocytes Relative: 10 %
Neutro Abs: 2 10*3/uL (ref 1.7–7.7)
Neutrophils Relative %: 62 %
Platelets: 144 10*3/uL — ABNORMAL LOW (ref 150–400)
RBC: 2.8 MIL/uL — ABNORMAL LOW (ref 3.87–5.11)
RDW: 16 % — ABNORMAL HIGH (ref 11.5–15.5)
WBC: 3.2 10*3/uL — ABNORMAL LOW (ref 4.0–10.5)
nRBC: 0 % (ref 0.0–0.2)

## 2021-12-02 LAB — COMPREHENSIVE METABOLIC PANEL
ALT: 8 U/L (ref 0–44)
AST: 11 U/L — ABNORMAL LOW (ref 15–41)
Albumin: 3.3 g/dL — ABNORMAL LOW (ref 3.5–5.0)
Alkaline Phosphatase: 80 U/L (ref 38–126)
Anion gap: 9 (ref 5–15)
BUN: 11 mg/dL (ref 8–23)
CO2: 26 mmol/L (ref 22–32)
Calcium: 9 mg/dL (ref 8.9–10.3)
Chloride: 104 mmol/L (ref 98–111)
Creatinine, Ser: 1.14 mg/dL — ABNORMAL HIGH (ref 0.44–1.00)
GFR, Estimated: 48 mL/min — ABNORMAL LOW (ref >60)
Glucose, Bld: 116 mg/dL — ABNORMAL HIGH (ref 70–99)
Potassium: 2.3 mmol/L — CL (ref 3.5–5.1)
Sodium: 139 mmol/L (ref 135–145)
Total Bilirubin: 0.8 mg/dL (ref 0.3–1.2)
Total Protein: 5.9 g/dL — ABNORMAL LOW (ref 6.5–8.1)

## 2021-12-02 LAB — IRON AND TIBC
Iron: 48 ug/dL (ref 28–170)
Saturation Ratios: 41 % — ABNORMAL HIGH (ref 10.4–31.8)
TIBC: 116 ug/dL — ABNORMAL LOW (ref 250–450)
UIBC: 68 ug/dL

## 2021-12-02 LAB — FERRITIN: Ferritin: 356 ng/mL — ABNORMAL HIGH (ref 11–307)

## 2021-12-02 LAB — LACTATE DEHYDROGENASE: LDH: 113 U/L (ref 98–192)

## 2021-12-02 LAB — MAGNESIUM: Magnesium: 1.4 mg/dL — ABNORMAL LOW (ref 1.7–2.4)

## 2021-12-02 MED ORDER — PROCHLORPERAZINE MALEATE 10 MG PO TABS
10.0000 mg | ORAL_TABLET | Freq: Once | ORAL | Status: AC
  Administered 2021-12-02: 10 mg via ORAL
  Filled 2021-12-02: qty 1

## 2021-12-02 MED ORDER — MAGNESIUM SULFATE 4 GM/100ML IV SOLN: 4.0000 g | Freq: Once | INTRAVENOUS | Status: DC

## 2021-12-02 MED ORDER — POTASSIUM CHLORIDE CRYS ER 20 MEQ PO TBCR
40.0000 meq | EXTENDED_RELEASE_TABLET | ORAL | Status: AC
  Administered 2021-12-02 (×2): 40 meq via ORAL
  Filled 2021-12-02 (×2): qty 2

## 2021-12-02 MED ORDER — SODIUM CHLORIDE 0.9 % IV SOLN: Freq: Once | INTRAVENOUS | Status: AC

## 2021-12-02 MED ORDER — DEXAMETHASONE 4 MG PO TABS
20.0000 mg | ORAL_TABLET | Freq: Once | ORAL | Status: AC
  Administered 2021-12-02: 20 mg via ORAL
  Filled 2021-12-02: qty 5

## 2021-12-02 MED ORDER — BORTEZOMIB CHEMO SQ INJECTION 3.5 MG (2.5MG/ML)
1.3000 mg/m2 | Freq: Once | INTRAMUSCULAR | Status: AC
  Administered 2021-12-02: 2.25 mg via SUBCUTANEOUS
  Filled 2021-12-02: qty 0.9

## 2021-12-02 MED ORDER — POTASSIUM CHLORIDE 10 MEQ/100ML IV SOLN
10.0000 meq | INTRAVENOUS | Status: AC
  Administered 2021-12-02 (×2): 10 meq via INTRAVENOUS
  Filled 2021-12-02 (×2): qty 100

## 2021-12-02 MED ORDER — EPOETIN ALFA-EPBX 20000 UNIT/ML IJ SOLN: 20000.0000 [IU] | Freq: Once | INTRAMUSCULAR | Status: DC

## 2021-12-02 MED ORDER — MAGNESIUM SULFATE 2 GM/50ML IV SOLN
2.0000 g | INTRAVENOUS | Status: AC
  Administered 2021-12-02 (×2): 2 g via INTRAVENOUS
  Filled 2021-12-02 (×2): qty 50

## 2021-12-02 NOTE — Patient Instructions (Signed)
[facility]  Discharge Instructions: Thank you for choosing [facility] to provide your oncology and hematology care.  If you have a lab appointment with the [facility], please come in thru the Main Entrance and check in at the main information desk.  Wear comfortable clothing and clothing appropriate for easy access to any Portacath or PICC line.   We strive to give you quality time with your provider. You may need to reschedule your appointment if you arrive late (15 or more minutes).  Arriving late affects you and other patients whose appointments are after yours.  Also, if you miss three or more appointments without notifying the office, you may be dismissed from the clinic at the provider's discretion.      For prescription refill requests, have your pharmacy contact our office and allow 72 hours for refills to be completed.    Today you received the following chemotherapy and/or immunotherapy agents Velcade/Magnesium/ Potassium      To help prevent nausea and vomiting after your treatment, we encourage you to take your nausea medication as directed.  BELOW ARE SYMPTOMS THAT SHOULD BE REPORTED IMMEDIATELY: *FEVER GREATER THAN 100.4 F (38 C) OR HIGHER *CHILLS OR SWEATING *NAUSEA AND VOMITING THAT IS NOT CONTROLLED WITH YOUR NAUSEA MEDICATION *UNUSUAL SHORTNESS OF BREATH *UNUSUAL BRUISING OR BLEEDING *URINARY PROBLEMS (pain or burning when urinating, or frequent urination) *BOWEL PROBLEMS (unusual diarrhea, constipation, pain near the anus) TENDERNESS IN MOUTH AND THROAT WITH OR WITHOUT PRESENCE OF ULCERS (sore throat, sores in mouth, or a toothache) UNUSUAL RASH, SWELLING OR PAIN  UNUSUAL VAGINAL DISCHARGE OR ITCHING   Items with * indicate a potential emergency and should be followed up as soon as possible or go to the Emergency Department if any problems should occur.  Please show the CHEMOTHERAPY ALERT CARD or IMMUNOTHERAPY ALERT CARD at check-in to  the Emergency Department and triage nurse.  Should you have questions after your visit or need to cancel or reschedule your appointment, please contact [facility] [phone]  and follow the prompts.  Office hours are 8:00 a.m. to 4:30 p.m. Monday - Friday. Please note that voicemails left after 4:00 p.m. may not be returned until the following business day.  We are closed weekends and major holidays. You have access to a nurse at all times for urgent questions. Please call the main number to the clinic [phone] and follow the prompts.  For any non-urgent questions, you may also contact your provider using MyChart. We now offer e-Visits for anyone 57 and older to request care online for non-urgent symptoms. For details visit [email or web address].   Also download the MyChart app! Go to the app store, search "MyChart", open the app, select [facility], and log in with your MyChart username and password.  Due to Covid, a mask is required upon entering the hospital/clinic. If you do not have a mask, one will be given to you upon arrival. For doctor visits, patients may have 1 support person aged 59 or older with them. For treatment visits, patients cannot have anyone with them due to current Covid guidelines and our immunocompromised population.

## 2021-12-02 NOTE — Progress Notes (Signed)
CRITICAL VALUE ALERT Critical value received: K+ 2.3 Date of notification:  12/02/21 Time of notification: 1010 Critical value read back:  Yes.   Nurse who received alert:  C. Philisha Weinel RN MD notified time and response:  MD, orders per protocol.

## 2021-12-02 NOTE — Progress Notes (Signed)
Patient presents today for Velcade and Retacrit injections per providers order.  Vital signs within parameters.  Magnesium and Potassium noted to be low.  51mEq Potassium PO before and after  20 mEq Potassium IV ordered.  Patient also getting 4 grams magnesium IV.   Peripheral IV started and blood return noted pre and post infusion.  Retacrit held for Hgb of 10.  Velcade administration without incident; injection site WNL; see MAR for injection details.  Patient tolerated procedure well and without incident.  No questions or complaints noted at this time. Magnesium and potassium infusions given today per MD orders.  Stable during infusion without adverse affects.  Vital signs stable.  No complaints at this time.  Discharge from clinic ambulatory in stable condition.  Alert and oriented X 3.  Declined AVS.  Follow up with [facility] as scheduled.

## 2021-12-03 LAB — KAPPA/LAMBDA LIGHT CHAINS
Kappa free light chain: 35.9 mg/L — ABNORMAL HIGH (ref 3.3–19.4)
Kappa, lambda light chain ratio: 1.62 (ref 0.26–1.65)
Lambda free light chains: 22.2 mg/L (ref 5.7–26.3)

## 2021-12-05 LAB — PROTEIN ELECTROPHORESIS, SERUM
A/G Ratio: 1.2 (ref 0.7–1.7)
Albumin ELP: 3.2 g/dL (ref 2.9–4.4)
Alpha-1-Globulin: 0.2 g/dL (ref 0.0–0.4)
Alpha-2-Globulin: 0.8 g/dL (ref 0.4–1.0)
Beta Globulin: 0.9 g/dL (ref 0.7–1.3)
Gamma Globulin: 0.7 g/dL (ref 0.4–1.8)
Globulin, Total: 2.7 g/dL (ref 2.2–3.9)
Total Protein ELP: 5.9 g/dL — ABNORMAL LOW (ref 6.0–8.5)

## 2021-12-08 NOTE — Progress Notes (Signed)
[facility] [address], [place] [address]   CLINIC:  Medical Oncology/Hematology  PCP:  [name], MD [address] / [place] [address] [phone]   REASON FOR VISIT:  Follow-up for multiple myeloma  PRIOR THERAPY: none  NGS Results: not done  CURRENT THERAPY: Velcade 3/4 weeks; Revlimid 20 mg 2/3 weeks  BRIEF ONCOLOGIC HISTORY:  Oncology History  Multiple myeloma not having achieved remission ([place])  01/20/2018 Initial Diagnosis   Multiple myeloma not having achieved remission ([place])   01/26/2018 -  Chemotherapy   Patient is on Treatment Plan : MYELOMA  RVD SQ (Bortezomib d 1,8,15 ) q28d x 4 cycles       CANCER STAGING:  Cancer Staging  No matching staging information was found for the patient.  INTERVAL HISTORY:  Brittany Archer, a 80 y.o. female, returns for routine follow-up and consideration for next cycle of chemotherapy. Verlin was last seen on 10/28/2021.  Due for day #15 cycle #49 of Velcade today.   Overall, she tells me she has been feeling pretty well. She is not consistently taking her potassium; she has difficulty swallowing large pills. She denies vomiting and diarrhea. She report occasional numbness in her right big toe. Her appetite is good.   Overall, she feels ready for next cycle of chemo today.   REVIEW OF SYSTEMS:  Review of Systems  Constitutional:  Negative for appetite change and fatigue.  Neurological:  Positive for numbness (R big toe).  All other systems reviewed and are negative.  PAST MEDICAL/SURGICAL HISTORY:  Past Medical History:  Diagnosis Date   Breast cancer ([place])    left breast/ 2008/ surg/ rad tx   Coronary artery disease    Diabetes mellitus    Past Surgical History:  Procedure Laterality Date   ABDOMINAL HYSTERECTOMY     BREAST SURGERY     DEBRIDEMENT MANDIBLE N/A 02/22/2020   Procedure: INCISION AND DRAINAGE WITH DEBRIDEMENT MANDIBLE;  Surgeon: Michael Litter, DMD;   Location: [facility];  Service: Oral Surgery;  Laterality: N/A;   DEBRIDEMENT MANDIBLE Right 03/19/2021   Procedure: DEBRIDEMENT OF BONE RIGHT INTERIOR  MANDIBLE;  Surgeon: Michael Litter, DMD;  Location: [facility];  Service: Oral Surgery;  Laterality: Right;   EYE SURGERY  2021   cataract removals    TOOTH EXTRACTION N/A 02/22/2020   Procedure: DENTAL RESTORATION/EXTRACTIONS;  Surgeon: Michael Litter, DMD;  Location: [facility];  Service: Oral Surgery;  Laterality: N/A;  DENTAL KIT REQUESTED    SOCIAL HISTORY:  Social History   Socioeconomic History   Marital status: Divorced    Spouse name: Not on file   Number of children: Not on file   Years of education: Not on file   Highest education level: Not on file  Occupational History   Not on file  Tobacco Use   Smoking status: Never   Smokeless tobacco: Never  Vaping Use   Vaping Use: Never used  Substance and Sexual Activity   Alcohol use: No   Drug use: No   Sexual activity: Yes    Birth control/protection: Surgical  Other Topics Concern   Not on file  Social History Narrative   Not on file   Social Determinants of Health   Financial Resource Strain: Low Risk    Difficulty of Paying Living Expenses: Not hard at all  Food Insecurity: No Food Insecurity   Worried About Running Out of Food in the Last Year: Never true  Ran Out of Food in the Last Year: Never true  Transportation Needs: No Transportation Needs   Lack of Transportation (Medical): No   Lack of Transportation (Non-Medical): No  Physical Activity: Inactive   Days of Exercise per Week: 0 days   Minutes of Exercise per Session: 0 min  Stress: No Stress Concern Present   Feeling of Stress : Not at all  Social Connections: Moderately Isolated   Frequency of Communication with Friends and Family: More than three times a week   Frequency of Social Gatherings with Friends and Family: More than three times a week   Attends Religious Services: More than 4 times per year   Active  Member of [employer] or Organizations: No   Attends Archivist Meetings: Never   Marital Status: Divorced  Human resources officer Violence: Not At Risk   Fear of Current or Ex-Partner: No   Emotionally Abused: No   Physically Abused: No   Sexually Abused: No    FAMILY HISTORY:  Family History  Problem Relation Age of Onset   Obesity Sister     CURRENT MEDICATIONS:  Current Outpatient Medications  Medication Sig Dispense Refill   acyclovir (ZOVIRAX) 400 MG tablet TAKE 1 TABLET BY MOUTH TWICE DAILY (Patient taking differently: Take 400 mg by mouth 2 (two) times daily.) 60 tablet 11   aspirin 81 MG tablet Take 81 mg by mouth daily.     bortezomib IV (VELCADE) 3.5 MG injection Inject 3.5 mg into the vein once a week. weekly     chlorhexidine (PERIDEX) 0.12 % solution Use as directed 15 mLs in the mouth or throat 3 (three) times daily.     dexamethasone (DECADRON) 4 MG tablet Take 10 tablets (40 mg) on days 1, 8, and 15 of chemo. Repeat every 21 days. (Patient taking differently: Take 4 mg by mouth See admin instructions. Take 10 tablets (40 mg) on days 1, 8, and 15 of chemo. Repeat every 21 days.) 30 tablet 3   glipiZIDE (GLUCOTROL) 5 MG tablet Take 5 mg by mouth daily before breakfast.      glucose blood (ACCU-CHEK AVIVA PLUS) test strip CHECK BLOOD SUGAR ONCE DAILY     lenalidomide (REVLIMID) 20 MG capsule Take 20 mg capsule by mouth once daily for 14 days on, and 7 days off of a 21 day cycle. 14 capsule 0   lisinopril-hydrochlorothiazide (PRINZIDE,ZESTORETIC) 20-25 MG tablet Take 1 tablet by mouth daily.     magnesium oxide (MAG-OX) 400 (241.3 Mg) MG tablet Take 1 tablet (400 mg total) by mouth in the morning, at noon, and at bedtime. 90 tablet 6   metFORMIN (GLUCOPHAGE) 1000 MG tablet Take 1,000 mg by mouth 2 (two) times daily with a meal.     potassium chloride 20 MEQ/15ML (10%) SOLN Take 6 mEq by mouth 3 (three) times daily. 5 ml     potassium chloride SA (KLOR-CON) 20 MEQ tablet TAKE  (2) TABLETS BY MOUTH THREE TIMES DAILY. 168 tablet 2   No current facility-administered medications for this visit.   Facility-Administered Medications Ordered in Other Visits  Medication Dose Route Frequency Provider Last Rate Last Admin   magnesium sulfate IVPB 2 g 50 mL  2 g Intravenous Q1 Hr x 2 Derek Jack, MD       potassium chloride 10 mEq in 100 mL IVPB  10 mEq Intravenous Q1 Hr x 2 Derek Jack, MD       potassium chloride SA (KLOR-CON M) CR tablet 40 mEq  40 mEq Oral Once Derek Jack, MD       potassium chloride SA (KLOR-CON M) CR tablet 40 mEq  40 mEq Oral Once Derek Jack, MD        ALLERGIES:  Allergies  Allergen Reactions   Seasonal Ic [Cholestatin] Other (See Comments)    Sneezing, watery eyes   Motrin [Ibuprofen] Rash    PHYSICAL EXAM:  Performance status (ECOG): 1 - Symptomatic but completely ambulatory  Vitals:   12/09/21 1022  BP: (!) 120/55  Pulse: 60  Resp: 17  Temp: 98.2 F (36.8 C)  SpO2: 100%   Wt Readings from Last 3 Encounters:  12/09/21 158 lb 3.2 oz (71.8 kg)  12/02/21 160 lb (72.6 kg)  11/25/21 160 lb 12.8 oz (72.9 kg)   Physical Exam Vitals reviewed.  Constitutional:      Appearance: Normal appearance.  Cardiovascular:     Rate and Rhythm: Normal rate and regular rhythm.     Pulses: Normal pulses.     Heart sounds: Normal heart sounds.  Pulmonary:     Effort: Pulmonary effort is normal.     Breath sounds: Normal breath sounds.  Neurological:     General: No focal deficit present.     Mental Status: She is alert and oriented to person, place, and time.  Psychiatric:        Mood and Affect: Mood normal.        Behavior: Behavior normal.    LABORATORY DATA:  I have reviewed the labs as listed.  CBC Latest Ref Rng & Units 12/09/2021 12/02/2021 11/25/2021  WBC 4.0 - 10.5 K/uL 3.3(L) 3.2(L) 3.1(L)  Hemoglobin 12.0 - 15.0 g/dL 10.3(L) 10.0(L) 9.8(L)  Hematocrit 36.0 - 46.0 % 30.3(L) 29.2(L) 28.9(L)   Platelets 150 - 400 K/uL 127(L) 144(L) 168   CMP Latest Ref Rng & Units 12/09/2021 12/02/2021 11/25/2021  Glucose 70 - 99 mg/dL 185(H) 116(H) 157(H)  BUN 8 - 23 mg/dL '16 11 20  ' Creatinine 0.44 - 1.00 mg/dL 1.14(H) 1.14(H) 1.19(H)  Sodium 135 - 145 mmol/L 139 139 138  Potassium 3.5 - 5.1 mmol/L 2.2(LL) 2.3(LL) 2.8(L)  Chloride 98 - 111 mmol/L 102 104 104  CO2 22 - 32 mmol/L '27 26 26  ' Calcium 8.9 - 10.3 mg/dL 9.1 9.0 9.5  Total Protein 6.5 - 8.1 g/dL 6.0(L) 5.9(L) 6.2(L)  Total Bilirubin 0.3 - 1.2 mg/dL 1.4(H) 0.8 1.4(H)  Alkaline Phos 38 - 126 U/L 77 80 88  AST 15 - 41 U/L 12(L) 11(L) 12(L)  ALT 0 - 44 U/L '9 8 7    ' DIAGNOSTIC IMAGING:  I have independently reviewed the scans and discussed with the patient. No results found.   ASSESSMENT:  1.  IgA kappa plasma cell myeloma, stage I: -RVD started on 01/09/2018, held since 02/14/2020 due to mandible abscess. -Myeloma labs on 05/14/2020 showed progression with M spike of 0.5 g. -RVD started back on 05/21/2020. -Myeloma labs on 07/10/2020 shows M spike improved to 0.3 g from 0.6 g previously.  Free light chain ratio is 1.74 with kappa light chains 28.4. -Myeloma panel from 08/14/2020 shows M spike 0.4 g.  Kappa light chains are 24.8 and ratio is 2.23.   2.  Osteomyelitis of the right mandible/dental abscess: -Finished IV ceftriaxone on 04/03/2020.  Finished oral antibiotics. -We will hold Xgeva indefinitely.    PLAN:  1.  IgA kappa plasma cell myeloma, stage I: - She reports that she is taking Revlimid 20 mg 2 weeks on/1 week off. -  We will continue Velcade 3 weeks on/1 week off along with dexamethasone 20 mg weekly. - We reviewed myeloma panel from 12/02/2021 which showed M spike is negative.  Free light chain ratio is normal at 1.62.  Kappa light chains are t 35. - CBC today is adequate to proceed with her next treatment. - RTC 6 weeks for follow-up.   2.  Severe hypokalemia: - She was supposed to take potassium 3 times daily at  home. - Apparently she is not taking potassium as they are found on the floor. She has severe hypokalemia today 2.2.  She will have both IV and p.o. potassium today. - We will change her potassium to powder and see if she is more compliant.   3.  Osteomyelitis of the right mandible/dental abscess: - Bisphosphonates were held indefinitely because of bone exposure on the right lower rib.   4.  Hypomagnesemia: - She was supposed to take magnesium 3 times daily. - She is not taking on regular basis.  I have reinforced her to not miss doses.  Magnesium today is 1.3.  She will receive magnesium IV.   5.  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  Hemoglobin today is 10.3.  Continue Procrit as needed.     Orders placed this encounter:  No orders of the defined types were placed in this encounter.    Derek Jack, MD [place] [phone]   I, Thana Ates, am acting as a scribe for Dr. Derek Jack.  I, Derek Jack MD, have reviewed the above documentation for accuracy and completeness, and I agree with the above.

## 2021-12-09 ENCOUNTER — Inpatient Hospital Stay (HOSPITAL_BASED_OUTPATIENT_CLINIC_OR_DEPARTMENT_OTHER): Payer: Medicare Other | Admitting: Hematology

## 2021-12-09 ENCOUNTER — Inpatient Hospital Stay (HOSPITAL_COMMUNITY): Payer: Medicare Other

## 2021-12-09 ENCOUNTER — Other Ambulatory Visit: Payer: Self-pay

## 2021-12-09 ENCOUNTER — Encounter (HOSPITAL_COMMUNITY): Payer: Self-pay | Admitting: Hematology

## 2021-12-09 VITALS — BP 126/56 | HR 59 | Temp 97.4°F | Resp 18

## 2021-12-09 VITALS — BP 120/55 | HR 60 | Temp 98.2°F | Resp 17 | Ht 61.0 in | Wt 158.2 lb

## 2021-12-09 DIAGNOSIS — D649 Anemia, unspecified: Secondary | ICD-10-CM

## 2021-12-09 DIAGNOSIS — D513 Other dietary vitamin B12 deficiency anemia: Secondary | ICD-10-CM

## 2021-12-09 DIAGNOSIS — E538 Deficiency of other specified B group vitamins: Secondary | ICD-10-CM

## 2021-12-09 DIAGNOSIS — C9000 Multiple myeloma not having achieved remission: Secondary | ICD-10-CM

## 2021-12-09 DIAGNOSIS — E876 Hypokalemia: Secondary | ICD-10-CM

## 2021-12-09 DIAGNOSIS — Z5112 Encounter for antineoplastic immunotherapy: Secondary | ICD-10-CM | POA: Diagnosis not present

## 2021-12-09 LAB — COMPREHENSIVE METABOLIC PANEL
ALT: 9 U/L (ref 0–44)
AST: 12 U/L — ABNORMAL LOW (ref 15–41)
Albumin: 3.4 g/dL — ABNORMAL LOW (ref 3.5–5.0)
Alkaline Phosphatase: 77 U/L (ref 38–126)
Anion gap: 10 (ref 5–15)
BUN: 16 mg/dL (ref 8–23)
CO2: 27 mmol/L (ref 22–32)
Calcium: 9.1 mg/dL (ref 8.9–10.3)
Chloride: 102 mmol/L (ref 98–111)
Creatinine, Ser: 1.14 mg/dL — ABNORMAL HIGH (ref 0.44–1.00)
GFR, Estimated: 48 mL/min — ABNORMAL LOW (ref >60)
Glucose, Bld: 185 mg/dL — ABNORMAL HIGH (ref 70–99)
Potassium: 2.2 mmol/L — CL (ref 3.5–5.1)
Sodium: 139 mmol/L (ref 135–145)
Total Bilirubin: 1.4 mg/dL — ABNORMAL HIGH (ref 0.3–1.2)
Total Protein: 6 g/dL — ABNORMAL LOW (ref 6.5–8.1)

## 2021-12-09 LAB — CBC WITH DIFFERENTIAL/PLATELET
Abs Immature Granulocytes: 0.02 10*3/uL (ref 0.00–0.07)
Basophils Absolute: 0 10*3/uL (ref 0.0–0.1)
Basophils Relative: 1 %
Eosinophils Absolute: 0.1 10*3/uL (ref 0.0–0.5)
Eosinophils Relative: 4 %
HCT: 30.3 % — ABNORMAL LOW (ref 36.0–46.0)
Hemoglobin: 10.3 g/dL — ABNORMAL LOW (ref 12.0–15.0)
Immature Granulocytes: 1 %
Lymphocytes Relative: 28 %
Lymphs Abs: 0.9 10*3/uL (ref 0.7–4.0)
MCH: 35 pg — ABNORMAL HIGH (ref 26.0–34.0)
MCHC: 34 g/dL (ref 30.0–36.0)
MCV: 103.1 fL — ABNORMAL HIGH (ref 80.0–100.0)
Monocytes Absolute: 0.3 10*3/uL (ref 0.1–1.0)
Monocytes Relative: 10 %
Neutro Abs: 1.9 10*3/uL (ref 1.7–7.7)
Neutrophils Relative %: 56 %
Platelets: 127 10*3/uL — ABNORMAL LOW (ref 150–400)
RBC: 2.94 MIL/uL — ABNORMAL LOW (ref 3.87–5.11)
RDW: 15.5 % (ref 11.5–15.5)
WBC: 3.3 10*3/uL — ABNORMAL LOW (ref 4.0–10.5)
nRBC: 0 % (ref 0.0–0.2)

## 2021-12-09 LAB — LACTATE DEHYDROGENASE: LDH: 126 U/L (ref 98–192)

## 2021-12-09 LAB — MAGNESIUM: Magnesium: 1.3 mg/dL — ABNORMAL LOW (ref 1.7–2.4)

## 2021-12-09 MED ORDER — SODIUM CHLORIDE 0.9 % IV SOLN: Freq: Once | INTRAVENOUS | Status: AC

## 2021-12-09 MED ORDER — PROCHLORPERAZINE MALEATE 10 MG PO TABS
10.0000 mg | ORAL_TABLET | Freq: Once | ORAL | Status: DC
  Filled 2021-12-09: qty 1

## 2021-12-09 MED ORDER — POTASSIUM ACETATE POWD
20.0000 meq | Freq: Every day | 5 refills | Status: DC
Start: 2021-12-09 — End: 2024-05-27

## 2021-12-09 MED ORDER — PROCHLORPERAZINE MALEATE 10 MG PO TABS
10.0000 mg | ORAL_TABLET | Freq: Once | ORAL | Status: AC
  Administered 2021-12-09: 10 mg via ORAL
  Filled 2021-12-09: qty 1

## 2021-12-09 MED ORDER — POTASSIUM CHLORIDE 10 MEQ/100ML IV SOLN
10.0000 meq | INTRAVENOUS | Status: AC
  Administered 2021-12-09 (×2): 10 meq via INTRAVENOUS
  Filled 2021-12-09 (×2): qty 100

## 2021-12-09 MED ORDER — POTASSIUM CHLORIDE CRYS ER 20 MEQ PO TBCR
40.0000 meq | EXTENDED_RELEASE_TABLET | Freq: Once | ORAL | Status: AC
  Administered 2021-12-09: 40 meq via ORAL
  Filled 2021-12-09: qty 2

## 2021-12-09 MED ORDER — DEXAMETHASONE 4 MG PO TABS
20.0000 mg | ORAL_TABLET | Freq: Once | ORAL | Status: AC
  Administered 2021-12-09: 20 mg via ORAL
  Filled 2021-12-09: qty 5

## 2021-12-09 MED ORDER — MAGNESIUM SULFATE 2 GM/50ML IV SOLN
2.0000 g | INTRAVENOUS | Status: AC
  Administered 2021-12-09 (×2): 2 g via INTRAVENOUS
  Filled 2021-12-09 (×2): qty 50

## 2021-12-09 MED ORDER — POTASSIUM CHLORIDE CRYS ER 20 MEQ PO TBCR: 40.0000 meq | EXTENDED_RELEASE_TABLET | Freq: Two times a day (BID) | ORAL | Status: DC

## 2021-12-09 MED ORDER — BORTEZOMIB CHEMO SQ INJECTION 3.5 MG (2.5MG/ML)
1.3000 mg/m2 | Freq: Once | INTRAMUSCULAR | Status: AC
  Administered 2021-12-09: 2.25 mg via SUBCUTANEOUS
  Filled 2021-12-09: qty 0.9

## 2021-12-09 MED ORDER — MAGNESIUM SULFATE 4 GM/100ML IV SOLN: 4.0000 g | Freq: Once | INTRAVENOUS | Status: DC

## 2021-12-09 NOTE — Patient Instructions (Signed)
[facility] at [facility] Discharge Instructions   You were seen and examined today by Dr. Delton Coombes.  He reviewed your lab work - your myeloma labs look good.  Your potassium and magnesium are very low today - we will give you IV potassium and magnesium today.  You need to take your potassium and magnesium as prescribed.   We will send in potassium powder for you to mix in liquid and drink.  You can mix in orange juice or soda.   Return as scheduled for lab work, treatment, and office visit.     Thank you for choosing [place] at [facility] to provide your oncology and hematology care.  To afford each patient quality time with our provider, please arrive at least 15 minutes before your scheduled appointment time.   If you have a lab appointment with the [facility] please come in thru the Main Entrance and check in at the main information desk.  You need to re-schedule your appointment should you arrive 10 or more minutes late.  We strive to give you quality time with our providers, and arriving late affects you and other patients whose appointments are after yours.  Also, if you no show three or more times for appointments you may be dismissed from the clinic at the providers discretion.     Again, thank you for choosing [facility].  Our hope is that these requests will decrease the amount of time that you wait before being seen by our physicians.       _____________________________________________________________  Should you have questions after your visit to [facility], please contact our office at [phone] and follow the prompts.  Our office hours are 8:00 a.m. and 4:30 p.m. Monday - Friday.  Please note that voicemails left after 4:00 p.m. may not be returned until the following business day.  We are closed weekends and major holidays.  You do have access to a nurse 24-7, just call the main number to  the clinic [phone] and do not press any options, hold on the line and a nurse will answer the phone.    For prescription refill requests, have your pharmacy contact our office and allow 72 hours.    Due to Covid, you will need to wear a mask upon entering the hospital. If you do not have a mask, a mask will be given to you at the Main Entrance upon arrival. For doctor visits, patients may have 1 support person age 49 or older with them. For treatment visits, patients can not have anyone with them due to social distancing guidelines and our immunocompromised population.

## 2021-12-09 NOTE — Patient Instructions (Signed)
[facility]  Discharge Instructions: Thank you for choosing [facility] to provide your oncology and hematology care.  If you have a lab appointment with the [facility], please come in thru the Main Entrance and check in at the main information desk.  Wear comfortable clothing and clothing appropriate for easy access to any Portacath or PICC line.   We strive to give you quality time with your provider. You may need to reschedule your appointment if you arrive late (15 or more minutes).  Arriving late affects you and other patients whose appointments are after yours.  Also, if you miss three or more appointments without notifying the office, you may be dismissed from the clinic at the providers discretion.      For prescription refill requests, have your pharmacy contact our office and allow 72 hours for refills to be completed.    Today you received the following chemotherapy and/or immunotherapy agents Velcade, Magnesium,Potassium      To help prevent nausea and vomiting after your treatment, we encourage you to take your nausea medication as directed.  BELOW ARE SYMPTOMS THAT SHOULD BE REPORTED IMMEDIATELY: *FEVER GREATER THAN 100.4 F (38 C) OR HIGHER *CHILLS OR SWEATING *NAUSEA AND VOMITING THAT IS NOT CONTROLLED WITH YOUR NAUSEA MEDICATION *UNUSUAL SHORTNESS OF BREATH *UNUSUAL BRUISING OR BLEEDING *URINARY PROBLEMS (pain or burning when urinating, or frequent urination) *BOWEL PROBLEMS (unusual diarrhea, constipation, pain near the anus) TENDERNESS IN MOUTH AND THROAT WITH OR WITHOUT PRESENCE OF ULCERS (sore throat, sores in mouth, or a toothache) UNUSUAL RASH, SWELLING OR PAIN  UNUSUAL VAGINAL DISCHARGE OR ITCHING   Items with * indicate a potential emergency and should be followed up as soon as possible or go to the Emergency Department if any problems should occur.  Please show the CHEMOTHERAPY ALERT CARD or IMMUNOTHERAPY ALERT CARD at check-in to  the Emergency Department and triage nurse.  Should you have questions after your visit or need to cancel or reschedule your appointment, please contact [facility] [phone]  and follow the prompts.  Office hours are 8:00 a.m. to 4:30 p.m. Monday - Friday. Please note that voicemails left after 4:00 p.m. may not be returned until the following business day.  We are closed weekends and major holidays. You have access to a nurse at all times for urgent questions. Please call the main number to the clinic [phone] and follow the prompts.  For any non-urgent questions, you may also contact your provider using MyChart. We now offer e-Visits for anyone 53 and older to request care online for non-urgent symptoms. For details visit [email or web address].   Also download the MyChart app! Go to the app store, search "MyChart", open the app, select [facility], and log in with your MyChart username and password.  Due to Covid, a mask is required upon entering the hospital/clinic. If you do not have a mask, one will be given to you upon arrival. For doctor visits, patients may have 1 support person aged 62 or older with them. For treatment visits, patients cannot have anyone with them due to current Covid guidelines and our immunocompromised population.

## 2021-12-09 NOTE — Progress Notes (Signed)
Patient presents today for Retacrit and Velcade per providers order.  Patients Potasiium noted to be 2.2 and Magnesium 1.3, MD aware.  Patient to receive 40 mEq PO Potassium before and after 20 mEq IV Potassium.  Patient also receiving 4 grams of magnesium IV.  Retacrit not given today per parameters of HGB 10.6.    Peripheral Iv started and blood return noted pre and post infusion.  Velcade, Magnesium and Potassium given today per MD orders.  Stable during injection and  infusion without adverse affects.  Vital signs stable.  No complaints at this time.  Discharge from clinic ambulatory in stable condition.  Alert and oriented X 3.  Follow up with [facility] as scheduled.

## 2021-12-09 NOTE — Progress Notes (Signed)
CRITICAL VALUE ALERT Critical value received:  K+ 2.2 Date of notification:  12-09-21 Time of notification: 1657 Critical value read back:  Yes.   Nurse who received alert:  C. Jerrian Mells RN MD notified time and response:  Dr. Raliegh Ip, will give potassium per standing orders.

## 2021-12-09 NOTE — Progress Notes (Signed)
Patient is taking Revlimd as prescribed.  She has not missed any doses and reports no side effects at this time.    Patient has been examined, vital signs and labs have been reviewed by Dr. Delton Coombes. ANC, Creatinine, LFTs, hemoglobin, and platelets are within treatment parameters per Dr. Delton Coombes. Patient may proceed with treatment per M.D.

## 2021-12-15 ENCOUNTER — Other Ambulatory Visit (HOSPITAL_COMMUNITY): Payer: Self-pay

## 2021-12-15 DIAGNOSIS — C9000 Multiple myeloma not having achieved remission: Secondary | ICD-10-CM

## 2021-12-15 MED ORDER — LENALIDOMIDE 20 MG PO CAPS: ORAL_CAPSULE | ORAL | 0 refills | Status: DC

## 2021-12-15 NOTE — Telephone Encounter (Signed)
Chart reviewed. Revlimid refilled per last office note with Dr. Katragadda.  

## 2021-12-25 ENCOUNTER — Inpatient Hospital Stay (HOSPITAL_COMMUNITY): Payer: Medicare Other

## 2021-12-25 ENCOUNTER — Other Ambulatory Visit: Payer: Self-pay

## 2021-12-25 ENCOUNTER — Encounter (HOSPITAL_COMMUNITY): Payer: Self-pay

## 2021-12-25 VITALS — BP 139/55 | HR 63 | Temp 97.8°F | Resp 18 | Ht 61.0 in | Wt 158.7 lb

## 2021-12-25 DIAGNOSIS — C9000 Multiple myeloma not having achieved remission: Secondary | ICD-10-CM

## 2021-12-25 DIAGNOSIS — E538 Deficiency of other specified B group vitamins: Secondary | ICD-10-CM

## 2021-12-25 DIAGNOSIS — Z5112 Encounter for antineoplastic immunotherapy: Secondary | ICD-10-CM | POA: Diagnosis not present

## 2021-12-25 LAB — CBC WITH DIFFERENTIAL/PLATELET
Abs Immature Granulocytes: 0.01 10*3/uL (ref 0.00–0.07)
Basophils Absolute: 0 10*3/uL (ref 0.0–0.1)
Basophils Relative: 1 %
Eosinophils Absolute: 0.1 10*3/uL (ref 0.0–0.5)
Eosinophils Relative: 5 %
HCT: 29.1 % — ABNORMAL LOW (ref 36.0–46.0)
Hemoglobin: 9.9 g/dL — ABNORMAL LOW (ref 12.0–15.0)
Immature Granulocytes: 0 %
Lymphocytes Relative: 27 %
Lymphs Abs: 0.9 10*3/uL (ref 0.7–4.0)
MCH: 35.2 pg — ABNORMAL HIGH (ref 26.0–34.0)
MCHC: 34 g/dL (ref 30.0–36.0)
MCV: 103.6 fL — ABNORMAL HIGH (ref 80.0–100.0)
Monocytes Absolute: 0.4 10*3/uL (ref 0.1–1.0)
Monocytes Relative: 13 %
Neutro Abs: 1.7 10*3/uL (ref 1.7–7.7)
Neutrophils Relative %: 54 %
Platelets: 180 10*3/uL (ref 150–400)
RBC: 2.81 MIL/uL — ABNORMAL LOW (ref 3.87–5.11)
RDW: 14.9 % (ref 11.5–15.5)
WBC: 3.1 10*3/uL — ABNORMAL LOW (ref 4.0–10.5)
nRBC: 0 % (ref 0.0–0.2)

## 2021-12-25 LAB — COMPREHENSIVE METABOLIC PANEL
ALT: 9 U/L (ref 0–44)
AST: 14 U/L — ABNORMAL LOW (ref 15–41)
Albumin: 3.5 g/dL (ref 3.5–5.0)
Alkaline Phosphatase: 83 U/L (ref 38–126)
Anion gap: 9 (ref 5–15)
BUN: 17 mg/dL (ref 8–23)
CO2: 28 mmol/L (ref 22–32)
Calcium: 8.9 mg/dL (ref 8.9–10.3)
Chloride: 104 mmol/L (ref 98–111)
Creatinine, Ser: 1.14 mg/dL — ABNORMAL HIGH (ref 0.44–1.00)
GFR, Estimated: 48 mL/min — ABNORMAL LOW (ref >60)
Glucose, Bld: 130 mg/dL — ABNORMAL HIGH (ref 70–99)
Potassium: 2.5 mmol/L — CL (ref 3.5–5.1)
Sodium: 141 mmol/L (ref 135–145)
Total Bilirubin: 1.2 mg/dL (ref 0.3–1.2)
Total Protein: 6.2 g/dL — ABNORMAL LOW (ref 6.5–8.1)

## 2021-12-25 LAB — MAGNESIUM: Magnesium: 1.2 mg/dL — ABNORMAL LOW (ref 1.7–2.4)

## 2021-12-25 MED ORDER — MAGNESIUM SULFATE 2 GM/50ML IV SOLN
2.0000 g | INTRAVENOUS | Status: AC
  Administered 2021-12-25 (×2): 2 g via INTRAVENOUS
  Filled 2021-12-25 (×2): qty 50

## 2021-12-25 MED ORDER — PROCHLORPERAZINE MALEATE 10 MG PO TABS
10.0000 mg | ORAL_TABLET | Freq: Once | ORAL | Status: AC
  Administered 2021-12-25: 11:00:00 10 mg via ORAL
  Filled 2021-12-25: qty 1

## 2021-12-25 MED ORDER — EPOETIN ALFA-EPBX 20000 UNIT/ML IJ SOLN
20000.0000 [IU] | Freq: Once | INTRAMUSCULAR | Status: AC
  Administered 2021-12-25: 11:00:00 20000 [IU] via SUBCUTANEOUS
  Filled 2021-12-25: qty 1

## 2021-12-25 MED ORDER — BORTEZOMIB CHEMO SQ INJECTION 3.5 MG (2.5MG/ML)
1.3000 mg/m2 | Freq: Once | INTRAMUSCULAR | Status: AC
  Administered 2021-12-25: 13:00:00 2.25 mg via SUBCUTANEOUS
  Filled 2021-12-25: qty 0.9

## 2021-12-25 MED ORDER — MAGNESIUM SULFATE 4 GM/100ML IV SOLN: 4.0000 g | Freq: Once | INTRAVENOUS | Status: DC

## 2021-12-25 MED ORDER — SODIUM CHLORIDE 0.9 % IV SOLN: INTRAVENOUS | Status: DC

## 2021-12-25 MED ORDER — POTASSIUM CHLORIDE 10 MEQ/100ML IV SOLN
10.0000 meq | INTRAVENOUS | Status: AC
  Administered 2021-12-25 (×2): 10 meq via INTRAVENOUS
  Filled 2021-12-25 (×2): qty 100

## 2021-12-25 MED ORDER — CYANOCOBALAMIN 1000 MCG/ML IJ SOLN
1000.0000 ug | Freq: Once | INTRAMUSCULAR | Status: AC
  Administered 2021-12-25: 11:00:00 1000 ug via INTRAMUSCULAR
  Filled 2021-12-25: qty 1

## 2021-12-25 MED ORDER — DEXAMETHASONE 4 MG PO TABS
20.0000 mg | ORAL_TABLET | Freq: Once | ORAL | Status: AC
  Administered 2021-12-25: 11:00:00 20 mg via ORAL
  Filled 2021-12-25: qty 5

## 2021-12-25 MED ORDER — POTASSIUM CHLORIDE CRYS ER 20 MEQ PO TBCR
40.0000 meq | EXTENDED_RELEASE_TABLET | ORAL | Status: AC
  Administered 2021-12-25 (×2): 40 meq via ORAL
  Filled 2021-12-25 (×2): qty 2

## 2021-12-25 NOTE — Progress Notes (Signed)
CRITICAL VALUE STICKER  CRITICAL VALUE: Potassium 2.5  DATE & TIME NOTIFIED: 12/25/2021 @ 1022  MESSENGER (representative from lab): Delma Freeze  MD NOTIFIED: Derek Jack, MD  TIME OF NOTIFICATION: 1027  RESPONSE:  Dr. Raliegh Ip will give potassium per standing orders.

## 2021-12-25 NOTE — Patient Instructions (Signed)
[facility]  Discharge Instructions: Thank you for choosing [facility] to provide your oncology and hematology care.  If you have a lab appointment with the Cancer Center, please come in thru the Main Entrance and check in at the main information desk.  Wear comfortable clothing and clothing appropriate for easy access to any Portacath or PICC line.   We strive to give you quality time with your provider. You may need to reschedule your appointment if you arrive late (15 or more minutes).  Arriving late affects you and other patients whose appointments are after yours.  Also, if you miss three or more appointments without notifying the office, you may be dismissed from the clinic at the provider's discretion.      For prescription refill requests, have your pharmacy contact our office and allow 72 hours for refills to be completed.        To help prevent nausea and vomiting after your treatment, we encourage you to take your nausea medication as directed.  BELOW ARE SYMPTOMS THAT SHOULD BE REPORTED IMMEDIATELY: *FEVER GREATER THAN 100.4 F (38 C) OR HIGHER *CHILLS OR SWEATING *NAUSEA AND VOMITING THAT IS NOT CONTROLLED WITH YOUR NAUSEA MEDICATION *UNUSUAL SHORTNESS OF BREATH *UNUSUAL BRUISING OR BLEEDING *URINARY PROBLEMS (pain or burning when urinating, or frequent urination) *BOWEL PROBLEMS (unusual diarrhea, constipation, pain near the anus) TENDERNESS IN MOUTH AND THROAT WITH OR WITHOUT PRESENCE OF ULCERS (sore throat, sores in mouth, or a toothache) UNUSUAL RASH, SWELLING OR PAIN  UNUSUAL VAGINAL DISCHARGE OR ITCHING   Items with * indicate a potential emergency and should be followed up as soon as possible or go to the Emergency Department if any problems should occur.  Please show the CHEMOTHERAPY ALERT CARD or IMMUNOTHERAPY ALERT CARD at check-in to the Emergency Department and triage nurse.  Should you have questions after your visit or need to cancel  or reschedule your appointment, please contact [facility] [phone]  and follow the prompts.  Office hours are 8:00 a.m. to 4:30 p.m. Monday - Friday. Please note that voicemails left after 4:00 p.m. may not be returned until the following business day.  We are closed weekends and major holidays. You have access to a nurse at all times for urgent questions. Please call the main number to the clinic [phone] and follow the prompts.  For any non-urgent questions, you may also contact your provider using MyChart. We now offer e-Visits for anyone 18 and older to request care online for non-urgent symptoms. For details visit [email or web address].   Also download the MyChart app! Go to the app store, search "MyChart", open the app, select [facility], and log in with your MyChart username and password.  Due to Covid, a mask is required upon entering the hospital/clinic. If you do not have a mask, one will be given to you upon arrival. For doctor visits, patients may have 1 support person aged 18 or older with them. For treatment visits, patients cannot have anyone with them due to current Covid guidelines and our immunocompromised population.  

## 2021-12-25 NOTE — Progress Notes (Signed)
Labs reviewed today and will give additional magnesium and potassium per orders. Ok to treat as well per MD. Treatment given per orders. Patient tolerated it well without problems. Vitals stable and discharged home from clinic via wheelchair. Follow up as scheduled.

## 2021-12-31 ENCOUNTER — Other Ambulatory Visit (HOSPITAL_COMMUNITY): Payer: Self-pay

## 2021-12-31 DIAGNOSIS — C9000 Multiple myeloma not having achieved remission: Secondary | ICD-10-CM

## 2021-12-31 MED ORDER — LENALIDOMIDE 20 MG PO CAPS: ORAL_CAPSULE | ORAL | 0 refills | Status: DC

## 2021-12-31 NOTE — Telephone Encounter (Signed)
Chart reviewed. Revlimid refilled per last office note with Dr. Katragadda.  

## 2022-01-01 ENCOUNTER — Inpatient Hospital Stay (HOSPITAL_COMMUNITY): Payer: Medicare Other | Attending: Hematology

## 2022-01-01 ENCOUNTER — Other Ambulatory Visit: Payer: Self-pay

## 2022-01-01 ENCOUNTER — Inpatient Hospital Stay (HOSPITAL_COMMUNITY): Payer: Medicare Other

## 2022-01-01 VITALS — BP 116/74 | HR 66 | Temp 96.9°F | Resp 18 | Ht 61.0 in | Wt 156.0 lb

## 2022-01-01 DIAGNOSIS — C9000 Multiple myeloma not having achieved remission: Secondary | ICD-10-CM | POA: Diagnosis present

## 2022-01-01 DIAGNOSIS — Z5112 Encounter for antineoplastic immunotherapy: Secondary | ICD-10-CM | POA: Insufficient documentation

## 2022-01-01 DIAGNOSIS — E538 Deficiency of other specified B group vitamins: Secondary | ICD-10-CM

## 2022-01-01 LAB — COMPREHENSIVE METABOLIC PANEL
ALT: 9 U/L (ref 0–44)
AST: 10 U/L — ABNORMAL LOW (ref 15–41)
Albumin: 3.5 g/dL (ref 3.5–5.0)
Alkaline Phosphatase: 86 U/L (ref 38–126)
Anion gap: 8 (ref 5–15)
BUN: 15 mg/dL (ref 8–23)
CO2: 27 mmol/L (ref 22–32)
Calcium: 9.3 mg/dL (ref 8.9–10.3)
Chloride: 104 mmol/L (ref 98–111)
Creatinine, Ser: 1.04 mg/dL — ABNORMAL HIGH (ref 0.44–1.00)
GFR, Estimated: 54 mL/min — ABNORMAL LOW (ref >60)
Glucose, Bld: 81 mg/dL (ref 70–99)
Potassium: 2.5 mmol/L — CL (ref 3.5–5.1)
Sodium: 139 mmol/L (ref 135–145)
Total Bilirubin: 1.3 mg/dL — ABNORMAL HIGH (ref 0.3–1.2)
Total Protein: 6.1 g/dL — ABNORMAL LOW (ref 6.5–8.1)

## 2022-01-01 LAB — CBC WITH DIFFERENTIAL/PLATELET
Abs Immature Granulocytes: 0.01 10*3/uL (ref 0.00–0.07)
Basophils Absolute: 0 10*3/uL (ref 0.0–0.1)
Basophils Relative: 0 %
Eosinophils Absolute: 0.1 10*3/uL (ref 0.0–0.5)
Eosinophils Relative: 4 %
HCT: 31.6 % — ABNORMAL LOW (ref 36.0–46.0)
Hemoglobin: 10.5 g/dL — ABNORMAL LOW (ref 12.0–15.0)
Immature Granulocytes: 0 %
Lymphocytes Relative: 22 %
Lymphs Abs: 0.7 10*3/uL (ref 0.7–4.0)
MCH: 34.9 pg — ABNORMAL HIGH (ref 26.0–34.0)
MCHC: 33.2 g/dL (ref 30.0–36.0)
MCV: 105 fL — ABNORMAL HIGH (ref 80.0–100.0)
Monocytes Absolute: 0.3 10*3/uL (ref 0.1–1.0)
Monocytes Relative: 9 %
Neutro Abs: 2.2 10*3/uL (ref 1.7–7.7)
Neutrophils Relative %: 65 %
Platelets: 143 10*3/uL — ABNORMAL LOW (ref 150–400)
RBC: 3.01 MIL/uL — ABNORMAL LOW (ref 3.87–5.11)
RDW: 15.6 % — ABNORMAL HIGH (ref 11.5–15.5)
WBC: 3.4 10*3/uL — ABNORMAL LOW (ref 4.0–10.5)
nRBC: 0 % (ref 0.0–0.2)

## 2022-01-01 LAB — MAGNESIUM: Magnesium: 1.2 mg/dL — ABNORMAL LOW (ref 1.7–2.4)

## 2022-01-01 MED ORDER — MAGNESIUM SULFATE 2 GM/50ML IV SOLN
2.0000 g | INTRAVENOUS | Status: AC
  Administered 2022-01-01 (×2): 2 g via INTRAVENOUS
  Filled 2022-01-01 (×2): qty 50

## 2022-01-01 MED ORDER — EPOETIN ALFA-EPBX 20000 UNIT/ML IJ SOLN: 20000.0000 [IU] | Freq: Once | INTRAMUSCULAR | Status: DC

## 2022-01-01 MED ORDER — BORTEZOMIB CHEMO SQ INJECTION 3.5 MG (2.5MG/ML)
1.3000 mg/m2 | Freq: Once | INTRAMUSCULAR | Status: AC
  Administered 2022-01-01: 2.25 mg via SUBCUTANEOUS
  Filled 2022-01-01: qty 0.9

## 2022-01-01 MED ORDER — SODIUM CHLORIDE 0.9 % IV SOLN: Freq: Once | INTRAVENOUS | Status: AC

## 2022-01-01 MED ORDER — POTASSIUM CHLORIDE 10 MEQ/100ML IV SOLN
10.0000 meq | INTRAVENOUS | Status: AC
  Administered 2022-01-01 (×2): 10 meq via INTRAVENOUS
  Filled 2022-01-01 (×2): qty 100

## 2022-01-01 MED ORDER — PROCHLORPERAZINE MALEATE 10 MG PO TABS
10.0000 mg | ORAL_TABLET | Freq: Once | ORAL | Status: AC
  Administered 2022-01-01: 10 mg via ORAL
  Filled 2022-01-01: qty 1

## 2022-01-01 MED ORDER — DEXAMETHASONE 4 MG PO TABS
20.0000 mg | ORAL_TABLET | Freq: Once | ORAL | Status: AC
  Administered 2022-01-01: 20 mg via ORAL
  Filled 2022-01-01: qty 5

## 2022-01-01 MED ORDER — POTASSIUM CHLORIDE CRYS ER 20 MEQ PO TBCR
40.0000 meq | EXTENDED_RELEASE_TABLET | ORAL | Status: AC
  Administered 2022-01-01 (×2): 40 meq via ORAL
  Filled 2022-01-01 (×2): qty 2

## 2022-01-01 MED ORDER — POTASSIUM CHLORIDE 10 MEQ/100ML IV SOLN
INTRAVENOUS | Status: AC
  Filled 2022-01-01: qty 100

## 2022-01-01 NOTE — Patient Instructions (Signed)
[facility]  Discharge Instructions: Thank you for choosing [facility] to provide your oncology and hematology care.  If you have a lab appointment with the [facility], please come in thru the Main Entrance and check in at the main information desk.  Wear comfortable clothing and clothing appropriate for easy access to any Portacath or PICC line.   We strive to give you quality time with your provider. You may need to reschedule your appointment if you arrive late (15 or more minutes).  Arriving late affects you and other patients whose appointments are after yours.  Also, if you miss three or more appointments without notifying the office, you may be dismissed from the clinic at the providers discretion.      For prescription refill requests, have your pharmacy contact our office and allow 72 hours for refills to be completed.    Today you received Velcade, 4g IV magnesium, and 68mEq IV potassium, and 40 mEq p.o. x 2 doses.  BELOW ARE SYMPTOMS THAT SHOULD BE REPORTED IMMEDIATELY: *FEVER GREATER THAN 100.4 F (38 C) OR HIGHER *CHILLS OR SWEATING *NAUSEA AND VOMITING THAT IS NOT CONTROLLED WITH YOUR NAUSEA MEDICATION *UNUSUAL SHORTNESS OF BREATH *UNUSUAL BRUISING OR BLEEDING *URINARY PROBLEMS (pain or burning when urinating, or frequent urination) *BOWEL PROBLEMS (unusual diarrhea, constipation, pain near the anus) TENDERNESS IN MOUTH AND THROAT WITH OR WITHOUT PRESENCE OF ULCERS (sore throat, sores in mouth, or a toothache) UNUSUAL RASH, SWELLING OR PAIN  UNUSUAL VAGINAL DISCHARGE OR ITCHING   Items with * indicate a potential emergency and should be followed up as soon as possible or go to the Emergency Department if any problems should occur.  Please show the CHEMOTHERAPY ALERT CARD or IMMUNOTHERAPY ALERT CARD at check-in to the Emergency Department and triage nurse.  Should you have questions after your visit or need to cancel or reschedule your  appointment, please contact [facility] [phone]  and follow the prompts.  Office hours are 8:00 a.m. to 4:30 p.m. Monday - Friday. Please note that voicemails left after 4:00 p.m. may not be returned until the following business day.  We are closed weekends and major holidays. You have access to a nurse at all times for urgent questions. Please call the main number to the clinic [phone] and follow the prompts.  For any non-urgent questions, you may also contact your provider using MyChart. We now offer e-Visits for anyone 21 and older to request care online for non-urgent symptoms. For details visit [email or web address].   Also download the MyChart app! Go to the app store, search "MyChart", open the app, select [facility], and log in with your MyChart username and password.  Due to Covid, a mask is required upon entering the hospital/clinic. If you do not have a mask, one will be given to you upon arrival. For doctor visits, patients may have 1 support person aged 59 or older with them. For treatment visits, patients cannot have anyone with them due to current Covid guidelines and our immunocompromised population.

## 2022-01-01 NOTE — Progress Notes (Signed)
CRITICAL VALUE ALERT Critical value received:  potassium 2.5 Date of notification:  01/01/2022 Time of notification: 09:45 am.  Critical value read back:  Yes.   Nurse who received alert:  B . Abe Schools RN MD notified time and response:  Delton Coombes / Lora Havens RN/ Karna Christmas RN.

## 2022-01-01 NOTE — Progress Notes (Signed)
Pt presents today for Velcade injection per provider's order. Vital signs stable and pt voiced no new complaints at this time. Retacrit held due to hgb of 10.5.  Potassium was 2.5, and Magnesium was 1.2 today. Message sent to Dr.K and he stated to give 74mEq IV potassium chloride and give 40 mEq p.o. x 2 doses of potassium  pre and post IV potassium chloride. 4g IV magnesium sulfate to be given.  Velcade, 20 mEq IV potassium chloride, 40 mEq p.o.x 2 doses, 4g IV magnesium given today per MD orders. Tolerated infusion without adverse affects. Vital signs stable. No complaints at this time. Discharged from clinic ambulatory in stable condition. Alert and oriented x 3. F/U with [facility] as scheduled.

## 2022-01-06 ENCOUNTER — Telehealth (HOSPITAL_COMMUNITY): Payer: Self-pay | Admitting: *Deleted

## 2022-01-06 ENCOUNTER — Other Ambulatory Visit (HOSPITAL_COMMUNITY): Payer: Self-pay | Admitting: Hematology

## 2022-01-06 NOTE — Telephone Encounter (Signed)
Received call from pharmacist at [facility] regarding appropriate form of potassium supplement.  Per Dr. Tomie China note on 12/09/21, form was changed to powder due to non compliance with pill form.

## 2022-01-08 ENCOUNTER — Inpatient Hospital Stay (HOSPITAL_COMMUNITY): Payer: Medicare Other

## 2022-01-08 ENCOUNTER — Other Ambulatory Visit: Payer: Self-pay

## 2022-01-08 VITALS — BP 144/65 | HR 74 | Temp 98.3°F | Resp 18 | Wt 151.1 lb

## 2022-01-08 DIAGNOSIS — E538 Deficiency of other specified B group vitamins: Secondary | ICD-10-CM

## 2022-01-08 DIAGNOSIS — Z5112 Encounter for antineoplastic immunotherapy: Secondary | ICD-10-CM | POA: Diagnosis not present

## 2022-01-08 DIAGNOSIS — C9000 Multiple myeloma not having achieved remission: Secondary | ICD-10-CM

## 2022-01-08 LAB — COMPREHENSIVE METABOLIC PANEL
ALT: 13 U/L (ref 0–44)
AST: 16 U/L (ref 15–41)
Albumin: 3.7 g/dL (ref 3.5–5.0)
Alkaline Phosphatase: 91 U/L (ref 38–126)
Anion gap: 7 (ref 5–15)
BUN: 21 mg/dL (ref 8–23)
CO2: 25 mmol/L (ref 22–32)
Calcium: 9.5 mg/dL (ref 8.9–10.3)
Chloride: 107 mmol/L (ref 98–111)
Creatinine, Ser: 1.16 mg/dL — ABNORMAL HIGH (ref 0.44–1.00)
GFR, Estimated: 47 mL/min — ABNORMAL LOW (ref >60)
Glucose, Bld: 182 mg/dL — ABNORMAL HIGH (ref 70–99)
Potassium: 2.6 mmol/L — CL (ref 3.5–5.1)
Sodium: 139 mmol/L (ref 135–145)
Total Bilirubin: 1.1 mg/dL (ref 0.3–1.2)
Total Protein: 6.3 g/dL — ABNORMAL LOW (ref 6.5–8.1)

## 2022-01-08 LAB — CBC WITH DIFFERENTIAL/PLATELET
Abs Immature Granulocytes: 0.03 10*3/uL (ref 0.00–0.07)
Basophils Absolute: 0 10*3/uL (ref 0.0–0.1)
Basophils Relative: 1 %
Eosinophils Absolute: 0.1 10*3/uL (ref 0.0–0.5)
Eosinophils Relative: 2 %
HCT: 33.8 % — ABNORMAL LOW (ref 36.0–46.0)
Hemoglobin: 11.3 g/dL — ABNORMAL LOW (ref 12.0–15.0)
Immature Granulocytes: 1 %
Lymphocytes Relative: 29 %
Lymphs Abs: 1 10*3/uL (ref 0.7–4.0)
MCH: 34.5 pg — ABNORMAL HIGH (ref 26.0–34.0)
MCHC: 33.4 g/dL (ref 30.0–36.0)
MCV: 103 fL — ABNORMAL HIGH (ref 80.0–100.0)
Monocytes Absolute: 0.3 10*3/uL (ref 0.1–1.0)
Monocytes Relative: 9 %
Neutro Abs: 2 10*3/uL (ref 1.7–7.7)
Neutrophils Relative %: 58 %
Platelets: 155 10*3/uL (ref 150–400)
RBC: 3.28 MIL/uL — ABNORMAL LOW (ref 3.87–5.11)
RDW: 15.4 % (ref 11.5–15.5)
WBC Morphology: REACTIVE
WBC: 3.4 10*3/uL — ABNORMAL LOW (ref 4.0–10.5)
nRBC: 0 % (ref 0.0–0.2)

## 2022-01-08 LAB — MAGNESIUM: Magnesium: 1.6 mg/dL — ABNORMAL LOW (ref 1.7–2.4)

## 2022-01-08 LAB — LACTATE DEHYDROGENASE: LDH: 124 U/L (ref 98–192)

## 2022-01-08 MED ORDER — POTASSIUM CHLORIDE 10 MEQ/100ML IV SOLN
10.0000 meq | INTRAVENOUS | Status: AC
  Administered 2022-01-08 (×2): 10 meq via INTRAVENOUS
  Filled 2022-01-08 (×2): qty 100

## 2022-01-08 MED ORDER — SODIUM CHLORIDE 0.9 % IV SOLN: Freq: Once | INTRAVENOUS | Status: AC

## 2022-01-08 MED ORDER — DEXAMETHASONE 4 MG PO TABS
20.0000 mg | ORAL_TABLET | Freq: Once | ORAL | Status: AC
  Administered 2022-01-08: 20 mg via ORAL
  Filled 2022-01-08: qty 5

## 2022-01-08 MED ORDER — BORTEZOMIB CHEMO SQ INJECTION 3.5 MG (2.5MG/ML)
1.3000 mg/m2 | Freq: Once | INTRAMUSCULAR | Status: AC
  Administered 2022-01-08: 2.25 mg via SUBCUTANEOUS
  Filled 2022-01-08: qty 0.9

## 2022-01-08 MED ORDER — POTASSIUM CHLORIDE CRYS ER 20 MEQ PO TBCR
40.0000 meq | EXTENDED_RELEASE_TABLET | ORAL | Status: AC
  Administered 2022-01-08 (×2): 40 meq via ORAL
  Filled 2022-01-08 (×2): qty 2

## 2022-01-08 MED ORDER — PROCHLORPERAZINE MALEATE 10 MG PO TABS
10.0000 mg | ORAL_TABLET | Freq: Once | ORAL | Status: AC
  Administered 2022-01-08: 10 mg via ORAL
  Filled 2022-01-08: qty 1

## 2022-01-08 MED ORDER — MAGNESIUM SULFATE 2 GM/50ML IV SOLN
2.0000 g | Freq: Once | INTRAVENOUS | Status: AC
  Administered 2022-01-08: 2 g via INTRAVENOUS
  Filled 2022-01-08: qty 50

## 2022-01-08 NOTE — Progress Notes (Signed)
CRITICAL VALUE ALERT Critical value received:  K+ 2.6 Date of notification:  01-08-22 Time of notification: 1010 Critical value read back:  Yes.   Nurse who received alert:  C.Omie Ferger RN MD notified time and response:  01-08-22, see orders.

## 2022-01-08 NOTE — Progress Notes (Signed)
Patient presents today for Velcade and Retacrit.  Patient's Potassium 2.6. 40 mEq of PO potassium x2 and 20 mEq of IV potassium orders placed per standing orders. Magnesium 1.6 2g of IV magnesium order placed per standing orders. Hemoglobin 11.3 patient will not receive Retacrit today d/t hemoglobin being out of treatment parameters. Patient tolerated potassium and magnesium infusions with no complaints voiced. Peripheral IV site clean and dry with good blood return noted before and after infusion. Band aid applied.  Patient tolerated Velcade injection with no complaints voiced. Lab work reviewed. See MAR for details. Injection site clean and dry with no bruising or swelling noted. Patient stable during and after injection. Band aid applied. VSS. Patient left in satisfactory condition with no s/s of distress noted.

## 2022-01-08 NOTE — Patient Instructions (Signed)
[facility]  Discharge Instructions: Thank you for choosing [facility] to provide your oncology and hematology care.  If you have a lab appointment with the [facility], please come in thru the Main Entrance and check in at the main information desk.  Wear comfortable clothing and clothing appropriate for easy access to any Portacath or PICC line.   We strive to give you quality time with your provider. You may need to reschedule your appointment if you arrive late (15 or more minutes).  Arriving late affects you and other patients whose appointments are after yours.  Also, if you miss three or more appointments without notifying the office, you may be dismissed from the clinic at the providers discretion.      For prescription refill requests, have your pharmacy contact our office and allow 72 hours for refills to be completed.    Today you received the following chemotherapy and/or immunotherapy agents; Velcade, Potassium and Magnesium.    To help prevent nausea and vomiting after your treatment, we encourage you to take your nausea medication as directed.  BELOW ARE SYMPTOMS THAT SHOULD BE REPORTED IMMEDIATELY: *FEVER GREATER THAN 100.4 F (38 C) OR HIGHER *CHILLS OR SWEATING *NAUSEA AND VOMITING THAT IS NOT CONTROLLED WITH YOUR NAUSEA MEDICATION *UNUSUAL SHORTNESS OF BREATH *UNUSUAL BRUISING OR BLEEDING *URINARY PROBLEMS (pain or burning when urinating, or frequent urination) *BOWEL PROBLEMS (unusual diarrhea, constipation, pain near the anus) TENDERNESS IN MOUTH AND THROAT WITH OR WITHOUT PRESENCE OF ULCERS (sore throat, sores in mouth, or a toothache) UNUSUAL RASH, SWELLING OR PAIN  UNUSUAL VAGINAL DISCHARGE OR ITCHING   Items with * indicate a potential emergency and should be followed up as soon as possible or go to the Emergency Department if any problems should occur.  Please show the CHEMOTHERAPY ALERT CARD or IMMUNOTHERAPY ALERT CARD at check-in  to the Emergency Department and triage nurse.  Should you have questions after your visit or need to cancel or reschedule your appointment, please contact [facility] [phone]  and follow the prompts.  Office hours are 8:00 a.m. to 4:30 p.m. Monday - Friday. Please note that voicemails left after 4:00 p.m. may not be returned until the following business day.  We are closed weekends and major holidays. You have access to a nurse at all times for urgent questions. Please call the main number to the clinic [phone] and follow the prompts.  For any non-urgent questions, you may also contact your provider using MyChart. We now offer e-Visits for anyone 46 and older to request care online for non-urgent symptoms. For details visit [email or web address].   Also download the MyChart app! Go to the app store, search "MyChart", open the app, select [facility], and log in with your MyChart username and password.  Due to Covid, a mask is required upon entering the hospital/clinic. If you do not have a mask, one will be given to you upon arrival. For doctor visits, patients may have 1 support person aged 63 or older with them. For treatment visits, patients cannot have anyone with them due to current Covid guidelines and our immunocompromised population.

## 2022-01-09 LAB — KAPPA/LAMBDA LIGHT CHAINS
Kappa free light chain: 39.2 mg/L — ABNORMAL HIGH (ref 3.3–19.4)
Kappa, lambda light chain ratio: 1.95 — ABNORMAL HIGH (ref 0.26–1.65)
Lambda free light chains: 20.1 mg/L (ref 5.7–26.3)

## 2022-01-10 LAB — PROTEIN ELECTROPHORESIS, SERUM
A/G Ratio: 1.4 (ref 0.7–1.7)
Albumin ELP: 3.6 g/dL (ref 2.9–4.4)
Alpha-1-Globulin: 0.2 g/dL (ref 0.0–0.4)
Alpha-2-Globulin: 0.8 g/dL (ref 0.4–1.0)
Beta Globulin: 0.9 g/dL (ref 0.7–1.3)
Gamma Globulin: 0.6 g/dL (ref 0.4–1.8)
Globulin, Total: 2.5 g/dL (ref 2.2–3.9)
Total Protein ELP: 6.1 g/dL (ref 6.0–8.5)

## 2022-01-14 LAB — IMMUNOFIXATION ELECTROPHORESIS
IgA: 232 mg/dL (ref 64–422)
IgG (Immunoglobin G), Serum: 677 mg/dL (ref 586–1602)
IgM (Immunoglobulin M), Srm: 76 mg/dL (ref 26–217)
Total Protein ELP: 6.2 g/dL (ref 6.0–8.5)

## 2022-01-16 ENCOUNTER — Other Ambulatory Visit (HOSPITAL_COMMUNITY): Payer: Self-pay

## 2022-01-16 DIAGNOSIS — C9000 Multiple myeloma not having achieved remission: Secondary | ICD-10-CM

## 2022-01-16 MED ORDER — LENALIDOMIDE 20 MG PO CAPS: ORAL_CAPSULE | ORAL | 0 refills | Status: DC

## 2022-01-16 NOTE — Telephone Encounter (Signed)
Chart reviewed. Revlimid refilled per last office note with Dr. Katragadda.  

## 2022-01-22 ENCOUNTER — Other Ambulatory Visit: Payer: Self-pay

## 2022-01-22 ENCOUNTER — Inpatient Hospital Stay (HOSPITAL_BASED_OUTPATIENT_CLINIC_OR_DEPARTMENT_OTHER): Payer: Medicare Other | Admitting: Hematology

## 2022-01-22 ENCOUNTER — Encounter (HOSPITAL_COMMUNITY): Payer: Self-pay | Admitting: Adult Health

## 2022-01-22 ENCOUNTER — Encounter (HOSPITAL_COMMUNITY): Payer: Self-pay | Admitting: Hematology

## 2022-01-22 ENCOUNTER — Inpatient Hospital Stay (HOSPITAL_COMMUNITY): Payer: Medicare Other

## 2022-01-22 VITALS — BP 107/54 | HR 58 | Temp 96.0°F | Resp 18 | Ht 59.92 in | Wt 152.2 lb

## 2022-01-22 DIAGNOSIS — C9000 Multiple myeloma not having achieved remission: Secondary | ICD-10-CM | POA: Diagnosis not present

## 2022-01-22 DIAGNOSIS — D649 Anemia, unspecified: Secondary | ICD-10-CM

## 2022-01-22 DIAGNOSIS — E538 Deficiency of other specified B group vitamins: Secondary | ICD-10-CM

## 2022-01-22 DIAGNOSIS — Z5112 Encounter for antineoplastic immunotherapy: Secondary | ICD-10-CM | POA: Diagnosis not present

## 2022-01-22 LAB — COMPREHENSIVE METABOLIC PANEL
ALT: 10 U/L (ref 0–44)
AST: 14 U/L — ABNORMAL LOW (ref 15–41)
Albumin: 3.6 g/dL (ref 3.5–5.0)
Alkaline Phosphatase: 74 U/L (ref 38–126)
Anion gap: 9 (ref 5–15)
BUN: 18 mg/dL (ref 8–23)
CO2: 26 mmol/L (ref 22–32)
Calcium: 9.5 mg/dL (ref 8.9–10.3)
Chloride: 102 mmol/L (ref 98–111)
Creatinine, Ser: 1.15 mg/dL — ABNORMAL HIGH (ref 0.44–1.00)
GFR, Estimated: 48 mL/min — ABNORMAL LOW (ref >60)
Glucose, Bld: 108 mg/dL — ABNORMAL HIGH (ref 70–99)
Potassium: 3.2 mmol/L — ABNORMAL LOW (ref 3.5–5.1)
Sodium: 137 mmol/L (ref 135–145)
Total Bilirubin: 0.9 mg/dL (ref 0.3–1.2)
Total Protein: 6.3 g/dL — ABNORMAL LOW (ref 6.5–8.1)

## 2022-01-22 LAB — CBC WITH DIFFERENTIAL/PLATELET
Abs Immature Granulocytes: 0.01 10*3/uL (ref 0.00–0.07)
Basophils Absolute: 0 10*3/uL (ref 0.0–0.1)
Basophils Relative: 1 %
Eosinophils Absolute: 0.1 10*3/uL (ref 0.0–0.5)
Eosinophils Relative: 5 %
HCT: 32.8 % — ABNORMAL LOW (ref 36.0–46.0)
Hemoglobin: 10.9 g/dL — ABNORMAL LOW (ref 12.0–15.0)
Immature Granulocytes: 0 %
Lymphocytes Relative: 30 %
Lymphs Abs: 0.9 10*3/uL (ref 0.7–4.0)
MCH: 35 pg — ABNORMAL HIGH (ref 26.0–34.0)
MCHC: 33.2 g/dL (ref 30.0–36.0)
MCV: 105.5 fL — ABNORMAL HIGH (ref 80.0–100.0)
Monocytes Absolute: 0.2 10*3/uL (ref 0.1–1.0)
Monocytes Relative: 7 %
Neutro Abs: 1.7 10*3/uL (ref 1.7–7.7)
Neutrophils Relative %: 57 %
Platelets: 206 10*3/uL (ref 150–400)
RBC: 3.11 MIL/uL — ABNORMAL LOW (ref 3.87–5.11)
RDW: 15.4 % (ref 11.5–15.5)
WBC: 2.9 10*3/uL — ABNORMAL LOW (ref 4.0–10.5)
nRBC: 0 % (ref 0.0–0.2)

## 2022-01-22 LAB — LACTATE DEHYDROGENASE: LDH: 129 U/L (ref 98–192)

## 2022-01-22 LAB — MAGNESIUM: Magnesium: 1.3 mg/dL — ABNORMAL LOW (ref 1.7–2.4)

## 2022-01-22 MED ORDER — POTASSIUM CHLORIDE CRYS ER 20 MEQ PO TBCR
40.0000 meq | EXTENDED_RELEASE_TABLET | Freq: Once | ORAL | Status: AC
  Administered 2022-01-22: 40 meq via ORAL
  Filled 2022-01-22: qty 2

## 2022-01-22 MED ORDER — DEXAMETHASONE 4 MG PO TABS
20.0000 mg | ORAL_TABLET | Freq: Once | ORAL | Status: AC
  Administered 2022-01-22: 20 mg via ORAL
  Filled 2022-01-22: qty 5

## 2022-01-22 MED ORDER — BORTEZOMIB CHEMO SQ INJECTION 3.5 MG (2.5MG/ML)
1.3000 mg/m2 | Freq: Once | INTRAMUSCULAR | Status: AC
  Administered 2022-01-22: 2.25 mg via SUBCUTANEOUS
  Filled 2022-01-22: qty 0.9

## 2022-01-22 MED ORDER — CYANOCOBALAMIN 1000 MCG/ML IJ SOLN
1000.0000 ug | Freq: Once | INTRAMUSCULAR | Status: AC
  Administered 2022-01-22: 1000 ug via INTRAMUSCULAR
  Filled 2022-01-22: qty 1

## 2022-01-22 MED ORDER — MAGNESIUM SULFATE 2 GM/50ML IV SOLN
2.0000 g | INTRAVENOUS | Status: AC
  Administered 2022-01-22 (×2): 2 g via INTRAVENOUS
  Filled 2022-01-22: qty 50

## 2022-01-22 MED ORDER — MAGNESIUM OXIDE -MG SUPPLEMENT 400 (240 MG) MG PO TABS: 400.0000 mg | ORAL_TABLET | Freq: Three times a day (TID) | ORAL | 6 refills | Status: DC

## 2022-01-22 MED ORDER — PROCHLORPERAZINE MALEATE 10 MG PO TABS
10.0000 mg | ORAL_TABLET | Freq: Once | ORAL | Status: AC
  Administered 2022-01-22: 10 mg via ORAL
  Filled 2022-01-22: qty 1

## 2022-01-22 NOTE — Progress Notes (Signed)
Pt presents today for Velcade/B12/Retacrit injection per provider's order. Pt's potassium is 3.2, and magnesium is 1.3 today. Pt voiced no new complaints at this time. Retacrit held today due to hgb of 10.9.  Message received from Dr.K/A.Anderson-RN to give pt 4g IV of Magnesium sulfate and Potassium chloride 40 mEq p.o x 1 dose. Peripheral IV started with good blood return pre and post infusion.  Velcade/B12 injections and 4g IV of Magnesium sulfate and 40 mEq of Potassium chloride p.o x 1 dose given today per MD orders. Tolerated infusion without adverse affects. Vital signs stable. No complaints at this time. Discharged from clinic ambulatory in stable condition. Alert and oriented x 3. F/U with [facility] as scheduled.

## 2022-01-22 NOTE — Progress Notes (Signed)
Brittany Archer [address] [place] [address]   CLINIC:  Medical Oncology/Hematology  PCP:  [name], MD [address] / [place] [address] [phone]   REASON FOR VISIT:  Follow-up for multiple myeloma  PRIOR THERAPY: none  NGS Results: not done  CURRENT THERAPY: Velcade 3/4 weeks; Revlimid 20 mg 2/3 weeks  BRIEF ONCOLOGIC HISTORY:  Oncology History  Multiple myeloma not having achieved remission ([facility])  01/20/2018 Initial Diagnosis   Multiple myeloma not having achieved remission ([facility])   01/26/2018 -  Chemotherapy   Patient is on Treatment Plan : MYELOMA  RVD SQ (Bortezomib d 1,8,15 ) q28d x 4 cycles       CANCER STAGING:  Cancer Staging  No matching staging information was found for the patient.  INTERVAL HISTORY:  [name], a 81 y.o. female, returns for routine follow-up and consideration for next cycle of chemotherapy. [name] was last seen on 12/09/2021.  Due for cycle #51 of VELCADE today.   Overall, she tells me she has been feeling pretty well. She denies numbness/tingling, n/v/d, and appetite loss. She denies recent infections. She is taking Revlimid without issue.   Overall, she feels ready for next cycle of chemo today.   REVIEW OF SYSTEMS:  Review of Systems  Constitutional:  Negative for appetite change and fatigue.  Gastrointestinal:  Negative for diarrhea, nausea and vomiting.  Neurological:  Negative for numbness.  All other systems reviewed and are negative.  PAST MEDICAL/SURGICAL HISTORY:  Past Medical History:  Diagnosis Date   Breast cancer ([facility])    left breast/ 2008/ surg/ rad tx   Coronary artery disease    Diabetes mellitus    Past Surgical History:  Procedure Laterality Date   ABDOMINAL HYSTERECTOMY     BREAST SURGERY     DEBRIDEMENT MANDIBLE N/A 02/22/2020   Procedure: INCISION AND DRAINAGE WITH DEBRIDEMENT MANDIBLE;  Surgeon: Michael Litter, DMD;  Location: [facility];  Service: Oral  Surgery;  Laterality: N/A;   DEBRIDEMENT MANDIBLE Right 03/19/2021   Procedure: DEBRIDEMENT OF BONE RIGHT INTERIOR  MANDIBLE;  Surgeon: Michael Litter, DMD;  Location: [facility];  Service: Oral Surgery;  Laterality: Right;   EYE SURGERY  2021   cataract removals    TOOTH EXTRACTION N/A 02/22/2020   Procedure: DENTAL RESTORATION/EXTRACTIONS;  Surgeon: Michael Litter, DMD;  Location: [facility];  Service: Oral Surgery;  Laterality: N/A;  DENTAL KIT REQUESTED    SOCIAL HISTORY:  Social History   Socioeconomic History   Marital status: Divorced    Spouse name: Not on file   Number of children: Not on file   Years of education: Not on file   Highest education level: Not on file  Occupational History   Not on file  Tobacco Use   Smoking status: Never   Smokeless tobacco: Never  Vaping Use   Vaping Use: Never used  Substance and Sexual Activity   Alcohol use: No   Drug use: No   Sexual activity: Yes    Birth control/protection: Surgical  Other Topics Concern   Not on file  Social History Narrative   Not on file   Social Determinants of Health   Financial Resource Strain: Not on file  Food Insecurity: Not on file  Transportation Needs: Not on file  Physical Activity: Not on file  Stress: Not on file  Social Connections: Not on file  Intimate Partner Violence: Not on file    FAMILY HISTORY:  Family History  Problem Relation Age of Onset   Obesity Sister     CURRENT MEDICATIONS:  Current Outpatient Medications  Medication Sig Dispense Refill   acyclovir (ZOVIRAX) 400 MG tablet TAKE 1 TABLET BY MOUTH TWICE DAILY (Patient taking differently: Take 400 mg by mouth 2 (two) times daily.) 60 tablet 11   aspirin 81 MG tablet Take 81 mg by mouth daily.     bortezomib IV (VELCADE) 3.5 MG injection Inject 3.5 mg into the vein once a week. weekly     chlorhexidine (PERIDEX) 0.12 % solution Use as directed 15 mLs in the mouth or throat 3 (three) times daily.     dexamethasone (DECADRON) 4 MG  tablet Take 10 tablets (40 mg) on days 1, 8, and 15 of chemo. Repeat every 21 days. (Patient taking differently: Take 4 mg by mouth See admin instructions. Take 10 tablets (40 mg) on days 1, 8, and 15 of chemo. Repeat every 21 days.) 30 tablet 3   glipiZIDE (GLUCOTROL) 5 MG tablet Take 5 mg by mouth daily before breakfast.      glucose blood (ACCU-CHEK AVIVA PLUS) test strip CHECK BLOOD SUGAR ONCE DAILY     lenalidomide (REVLIMID) 20 MG capsule Take 20 mg capsule by mouth once daily for 14 days on, and 7 days off of a 21 day cycle. 14 capsule 0   lisinopril-hydrochlorothiazide (PRINZIDE,ZESTORETIC) 20-25 MG tablet Take 1 tablet by mouth daily.     magnesium oxide (MAG-OX) 400 (240 Mg) MG tablet Take 1 tablet (400 mg total) by mouth in the morning, at noon, and at bedtime. 90 tablet 6   metFORMIN (GLUCOPHAGE) 1000 MG tablet Take 1,000 mg by mouth 2 (two) times daily with a meal.     Potassium Acetate POWD Take 20 mEq by mouth daily. 12000 g 5   No current facility-administered medications for this visit.    ALLERGIES:  Allergies  Allergen Reactions   Other Other (See Comments)   Seasonal Ic [Cholestatin] Other (See Comments)    Sneezing, watery eyes   Motrin [Ibuprofen] Rash    PHYSICAL EXAM:  Performance status (ECOG): 1 - Symptomatic but completely ambulatory  Vitals:   01/22/22 0927  BP: (!) 107/54  Pulse: (!) 58  Resp: 18  Temp: (!) 96 F (35.6 C)  SpO2: 100%   Wt Readings from Last 3 Encounters:  01/22/22 152 lb 3.2 oz (69 kg)  01/08/22 151 lb 1.6 oz (68.5 kg)  01/01/22 156 lb (70.8 kg)   Physical Exam Vitals reviewed.  Constitutional:      Appearance: Normal appearance.  Cardiovascular:     Rate and Rhythm: Normal rate and regular rhythm.     Pulses: Normal pulses.     Heart sounds: Normal heart sounds.  Pulmonary:     Effort: Pulmonary effort is normal.     Breath sounds: Normal breath sounds.  Neurological:     General: No focal deficit present.     Mental  Status: She is alert and oriented to person, place, and time.  Psychiatric:        Mood and Affect: Mood normal.        Behavior: Behavior normal.    LABORATORY DATA:  I have reviewed the labs as listed.  CBC Latest Ref Rng & Units 01/22/2022 01/08/2022 01/01/2022  WBC 4.0 - 10.5 K/uL 2.9(L) 3.4(L) 3.4(L)  Hemoglobin 12.0 - 15.0 g/dL 10.9(L) 11.3(L) 10.5(L)  Hematocrit 36.0 - 46.0 % 32.8(L) 33.8(L) 31.6(L)  Platelets 150 -  400 K/uL 206 155 143(L)   CMP Latest Ref Rng & Units 01/22/2022 01/08/2022 01/01/2022  Glucose 70 - 99 mg/dL 108(H) 182(H) 81  BUN 8 - 23 mg/dL '18 21 15  ' Creatinine 0.44 - 1.00 mg/dL 1.15(H) 1.16(H) 1.04(H)  Sodium 135 - 145 mmol/L 137 139 139  Potassium 3.5 - 5.1 mmol/L 3.2(L) 2.6(LL) 2.5(LL)  Chloride 98 - 111 mmol/L 102 107 104  CO2 22 - 32 mmol/L '26 25 27  ' Calcium 8.9 - 10.3 mg/dL 9.5 9.5 9.3  Total Protein 6.5 - 8.1 g/dL 6.3(L) 6.3(L) 6.1(L)  Total Bilirubin 0.3 - 1.2 mg/dL 0.9 1.1 1.3(H)  Alkaline Phos 38 - 126 U/L 74 91 86  AST 15 - 41 U/L 14(L) 16 10(L)  ALT 0 - 44 U/L '10 13 9    ' DIAGNOSTIC IMAGING:  I have independently reviewed the scans and discussed with the patient. No results found.   ASSESSMENT:  1.  IgA kappa plasma cell myeloma, stage I: -RVD started on 01/09/2018, held since 02/14/2020 due to mandible abscess. -Myeloma labs on 05/14/2020 showed progression with M spike of 0.5 g. -RVD started back on 05/21/2020. -Myeloma labs on 07/10/2020 shows M spike improved to 0.3 g from 0.6 g previously.  Free light chain ratio is 1.74 with kappa light chains 28.4. -Myeloma panel from 08/14/2020 shows M spike 0.4 g.  Kappa light chains are 24.8 and ratio is 2.23.   2.  Osteomyelitis of the right mandible/dental abscess: -Finished IV ceftriaxone on 04/03/2020.  Finished oral antibiotics. -We will hold Xgeva indefinitely.   PLAN:  1.  IgA kappa plasma cell myeloma, stage I: - She reports that she is taking Revlimid 20 mg 2 weeks on/1 week off. - Reviewed  myeloma labs from 01/08/2022 which showed M spike negative.  Immunofixation was normal.  Free light chain ratio is 1.95 with kappa light chain 39. - We will continue Velcade 3 weeks on/1 week off along with dexamethasone 20 mg weekly. - RTC 6 weeks with myeloma labs 1 week prior.   2.  Severe hypokalemia: - She reports that she is taking potassium 3 times daily at home. - Potassium today is better at 3.2.   3.  Osteomyelitis of the right mandible/dental abscess: - Bisphosphonates held indefinitely due to ONJ.   4.  Hypomagnesemia: - Magnesium severely low at 1.3 today.  She is not taking magnesium. - She will receive IV magnesium.  We will start her on magnesium 1 tablet 3 times daily.   5.  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 - Hemoglobin today is 10.9.  Last ferritin was 356 and percent saturation 41 on 12/02/2021.   Orders placed this encounter:  No orders of the defined types were placed in this encounter.    Derek Jack, MD [facility] [phone]   I, Thana Ates, am acting as a scribe for Dr. Derek Jack.  I, Derek Jack MD, have reviewed the above documentation for accuracy and completeness, and I agree with the above.

## 2022-01-22 NOTE — Patient Instructions (Signed)
[facility]  Discharge Instructions: Thank you for choosing [facility] to provide your oncology and hematology care.  If you have a lab appointment with the [facility], please come in thru the Main Entrance and check in at the main information desk.  Wear comfortable clothing and clothing appropriate for easy access to any Portacath or PICC line.   We strive to give you quality time with your provider. You may need to reschedule your appointment if you arrive late (15 or more minutes).  Arriving late affects you and other patients whose appointments are after yours.  Also, if you miss three or more appointments without notifying the office, you may be dismissed from the clinic at the providers discretion.      For prescription refill requests, have your pharmacy contact our office and allow 72 hours for refills to be completed.    Today you received the following chemotherapy and/or immunotherapy agents Velcade/B12 injections, potassium chloride 40 mEq p.o. x 1 dose, and 4g IV Magnesium sulfate   To help prevent nausea and vomiting after your treatment, we encourage you to take your nausea medication as directed.  BELOW ARE SYMPTOMS THAT SHOULD BE REPORTED IMMEDIATELY: *FEVER GREATER THAN 100.4 F (38 C) OR HIGHER *CHILLS OR SWEATING *NAUSEA AND VOMITING THAT IS NOT CONTROLLED WITH YOUR NAUSEA MEDICATION *UNUSUAL SHORTNESS OF BREATH *UNUSUAL BRUISING OR BLEEDING *URINARY PROBLEMS (pain or burning when urinating, or frequent urination) *BOWEL PROBLEMS (unusual diarrhea, constipation, pain near the anus) TENDERNESS IN MOUTH AND THROAT WITH OR WITHOUT PRESENCE OF ULCERS (sore throat, sores in mouth, or a toothache) UNUSUAL RASH, SWELLING OR PAIN  UNUSUAL VAGINAL DISCHARGE OR ITCHING   Items with * indicate a potential emergency and should be followed up as soon as possible or go to the Emergency Department if any problems should occur.  Please show the  CHEMOTHERAPY ALERT CARD or IMMUNOTHERAPY ALERT CARD at check-in to the Emergency Department and triage nurse.  Should you have questions after your visit or need to cancel or reschedule your appointment, please contact [facility] [phone]  and follow the prompts.  Office hours are 8:00 a.m. to 4:30 p.m. Monday - Friday. Please note that voicemails left after 4:00 p.m. may not be returned until the following business day.  We are closed weekends and major holidays. You have access to a nurse at all times for urgent questions. Please call the main number to the clinic [phone] and follow the prompts.  For any non-urgent questions, you may also contact your provider using MyChart. We now offer e-Visits for anyone 82 and older to request care online for non-urgent symptoms. For details visit [email or web address].   Also download the MyChart app! Go to the app store, search "MyChart", open the app, select [facility], and log in with your MyChart username and password.  Due to Covid, a mask is required upon entering the hospital/clinic. If you do not have a mask, one will be given to you upon arrival. For doctor visits, patients may have 1 support person aged 30 or older with them. For treatment visits, patients cannot have anyone with them due to current Covid guidelines and our immunocompromised population.

## 2022-01-22 NOTE — Progress Notes (Signed)
Patient has been examined by Dr. Delton Coombes, and vital signs and labs have been reviewed. ANC, Creatinine, LFTs, hemoglobin, and platelets are within treatment parameters per M.D. - pt may proceed with treatment.   Pt will receive magnesium 4 g IV for hypomagnesemia.

## 2022-01-22 NOTE — Progress Notes (Signed)
Patient is taking Revlimid as prescribed.  She has not missed any doses and reports no side effects at this time.   

## 2022-01-22 NOTE — Patient Instructions (Signed)
[facility] at [facility] Discharge Instructions   You were seen and examined today by Dr. Delton Coombes.  He reviewed your lab work today which is normal/stable.  Your magnesium was low - we will give you IV magnesium in the clinic today and we also sent prescription for magnesium pills to take three times a day.  We will proceed with treatment today.  Return as scheduled for lab work and treatment.    Thank you for choosing [facility] at [facility] to provide your oncology and hematology care.  To afford each patient quality time with our provider, please arrive at least 15 minutes before your scheduled appointment time.   If you have a lab appointment with the [facility] please come in thru the Main Entrance and check in at the main information desk.  You need to re-schedule your appointment should you arrive 10 or more minutes late.  We strive to give you quality time with our providers, and arriving late affects you and other patients whose appointments are after yours.  Also, if you no show three or more times for appointments you may be dismissed from the clinic at the providers discretion.     Again, thank you for choosing [facility].  Our hope is that these requests will decrease the amount of time that you wait before being seen by our physicians.       _____________________________________________________________  Should you have questions after your visit to [facility], please contact our office at [phone] and follow the prompts.  Our office hours are 8:00 a.m. and 4:30 p.m. Monday - Friday.  Please note that voicemails left after 4:00 p.m. may not be returned until the following business day.  We are closed weekends and major holidays.  You do have access to a nurse 24-7, just call the main number to the clinic [phone] and do not press any options, hold on the line and a nurse will answer the  phone.    For prescription refill requests, have your pharmacy contact our office and allow 72 hours.    Due to Covid, you will need to wear a mask upon entering the hospital. If you do not have a mask, a mask will be given to you at the Main Entrance upon arrival. For doctor visits, patients may have 1 support person age 87 or older with them. For treatment visits, patients can not have anyone with them due to social distancing guidelines and our immunocompromised population.

## 2022-01-29 ENCOUNTER — Inpatient Hospital Stay (HOSPITAL_COMMUNITY): Payer: Medicare Other

## 2022-01-29 ENCOUNTER — Other Ambulatory Visit: Payer: Self-pay

## 2022-01-29 ENCOUNTER — Inpatient Hospital Stay (HOSPITAL_COMMUNITY): Payer: Medicare Other | Attending: Hematology

## 2022-01-29 VITALS — BP 109/51 | HR 60 | Temp 98.2°F | Resp 18 | Ht 61.0 in | Wt 149.0 lb

## 2022-01-29 DIAGNOSIS — Z5112 Encounter for antineoplastic immunotherapy: Secondary | ICD-10-CM | POA: Diagnosis not present

## 2022-01-29 DIAGNOSIS — D631 Anemia in chronic kidney disease: Secondary | ICD-10-CM | POA: Insufficient documentation

## 2022-01-29 DIAGNOSIS — E538 Deficiency of other specified B group vitamins: Secondary | ICD-10-CM

## 2022-01-29 DIAGNOSIS — N1832 Chronic kidney disease, stage 3b: Secondary | ICD-10-CM | POA: Diagnosis not present

## 2022-01-29 DIAGNOSIS — C9000 Multiple myeloma not having achieved remission: Secondary | ICD-10-CM | POA: Diagnosis present

## 2022-01-29 DIAGNOSIS — E876 Hypokalemia: Secondary | ICD-10-CM

## 2022-01-29 LAB — CBC WITH DIFFERENTIAL/PLATELET
Abs Immature Granulocytes: 0.01 10*3/uL (ref 0.00–0.07)
Basophils Absolute: 0 10*3/uL (ref 0.0–0.1)
Basophils Relative: 1 %
Eosinophils Absolute: 0.1 10*3/uL (ref 0.0–0.5)
Eosinophils Relative: 2 %
HCT: 31.9 % — ABNORMAL LOW (ref 36.0–46.0)
Hemoglobin: 10.7 g/dL — ABNORMAL LOW (ref 12.0–15.0)
Immature Granulocytes: 0 %
Lymphocytes Relative: 22 %
Lymphs Abs: 0.9 10*3/uL (ref 0.7–4.0)
MCH: 35.1 pg — ABNORMAL HIGH (ref 26.0–34.0)
MCHC: 33.5 g/dL (ref 30.0–36.0)
MCV: 104.6 fL — ABNORMAL HIGH (ref 80.0–100.0)
Monocytes Absolute: 0.5 10*3/uL (ref 0.1–1.0)
Monocytes Relative: 11 %
Neutro Abs: 2.7 10*3/uL (ref 1.7–7.7)
Neutrophils Relative %: 64 %
Platelets: 159 10*3/uL (ref 150–400)
RBC: 3.05 MIL/uL — ABNORMAL LOW (ref 3.87–5.11)
RDW: 14.9 % (ref 11.5–15.5)
WBC: 4.2 10*3/uL (ref 4.0–10.5)
nRBC: 0 % (ref 0.0–0.2)

## 2022-01-29 LAB — COMPREHENSIVE METABOLIC PANEL
ALT: 11 U/L (ref 0–44)
AST: 14 U/L — ABNORMAL LOW (ref 15–41)
Albumin: 3.4 g/dL — ABNORMAL LOW (ref 3.5–5.0)
Alkaline Phosphatase: 84 U/L (ref 38–126)
Anion gap: 9 (ref 5–15)
BUN: 15 mg/dL (ref 8–23)
CO2: 28 mmol/L (ref 22–32)
Calcium: 9.3 mg/dL (ref 8.9–10.3)
Chloride: 103 mmol/L (ref 98–111)
Creatinine, Ser: 1.03 mg/dL — ABNORMAL HIGH (ref 0.44–1.00)
GFR, Estimated: 55 mL/min — ABNORMAL LOW (ref >60)
Glucose, Bld: 176 mg/dL — ABNORMAL HIGH (ref 70–99)
Potassium: 3.2 mmol/L — ABNORMAL LOW (ref 3.5–5.1)
Sodium: 140 mmol/L (ref 135–145)
Total Bilirubin: 0.6 mg/dL (ref 0.3–1.2)
Total Protein: 6.4 g/dL — ABNORMAL LOW (ref 6.5–8.1)

## 2022-01-29 LAB — MAGNESIUM: Magnesium: 1.2 mg/dL — ABNORMAL LOW (ref 1.7–2.4)

## 2022-01-29 LAB — LACTATE DEHYDROGENASE: LDH: 128 U/L (ref 98–192)

## 2022-01-29 MED ORDER — POTASSIUM CHLORIDE CRYS ER 20 MEQ PO TBCR
40.0000 meq | EXTENDED_RELEASE_TABLET | Freq: Once | ORAL | Status: AC
  Administered 2022-01-29: 40 meq via ORAL
  Filled 2022-01-29: qty 2

## 2022-01-29 MED ORDER — MAGNESIUM SULFATE 2 GM/50ML IV SOLN
2.0000 g | INTRAVENOUS | Status: AC
  Administered 2022-01-29 (×2): 2 g via INTRAVENOUS
  Filled 2022-01-29 (×2): qty 50

## 2022-01-29 MED ORDER — CYANOCOBALAMIN 1000 MCG/ML IJ SOLN: 1000.0000 ug | Freq: Once | INTRAMUSCULAR | Status: DC

## 2022-01-29 MED ORDER — BORTEZOMIB CHEMO SQ INJECTION 3.5 MG (2.5MG/ML)
1.3000 mg/m2 | Freq: Once | INTRAMUSCULAR | Status: AC
  Administered 2022-01-29: 2.25 mg via SUBCUTANEOUS
  Filled 2022-01-29: qty 0.9

## 2022-01-29 MED ORDER — DEXAMETHASONE 4 MG PO TABS
20.0000 mg | ORAL_TABLET | Freq: Once | ORAL | Status: AC
  Administered 2022-01-29: 20 mg via ORAL
  Filled 2022-01-29: qty 5

## 2022-01-29 MED ORDER — EPOETIN ALFA-EPBX 20000 UNIT/ML IJ SOLN: 20000.0000 [IU] | Freq: Once | INTRAMUSCULAR | Status: DC

## 2022-01-29 MED ORDER — PROCHLORPERAZINE MALEATE 10 MG PO TABS
10.0000 mg | ORAL_TABLET | Freq: Once | ORAL | Status: AC
  Administered 2022-01-29: 10 mg via ORAL
  Filled 2022-01-29: qty 1

## 2022-01-29 MED ORDER — SODIUM CHLORIDE 0.9 % IV SOLN: Freq: Once | INTRAVENOUS | Status: AC

## 2022-01-29 NOTE — Patient Instructions (Signed)
[facility]  Discharge Instructions: Thank you for choosing [facility] to provide your oncology and hematology care.  If you have a lab appointment with the Cancer Center, please come in thru the Main Entrance and check in at the main information desk.  Wear comfortable clothing and clothing appropriate for easy access to any Portacath or PICC line.   We strive to give you quality time with your provider. You may need to reschedule your appointment if you arrive late (15 or more minutes).  Arriving late affects you and other patients whose appointments are after yours.  Also, if you miss three or more appointments without notifying the office, you may be dismissed from the clinic at the provider's discretion.      For prescription refill requests, have your pharmacy contact our office and allow 72 hours for refills to be completed.        To help prevent nausea and vomiting after your treatment, we encourage you to take your nausea medication as directed.  BELOW ARE SYMPTOMS THAT SHOULD BE REPORTED IMMEDIATELY: *FEVER GREATER THAN 100.4 F (38 C) OR HIGHER *CHILLS OR SWEATING *NAUSEA AND VOMITING THAT IS NOT CONTROLLED WITH YOUR NAUSEA MEDICATION *UNUSUAL SHORTNESS OF BREATH *UNUSUAL BRUISING OR BLEEDING *URINARY PROBLEMS (pain or burning when urinating, or frequent urination) *BOWEL PROBLEMS (unusual diarrhea, constipation, pain near the anus) TENDERNESS IN MOUTH AND THROAT WITH OR WITHOUT PRESENCE OF ULCERS (sore throat, sores in mouth, or a toothache) UNUSUAL RASH, SWELLING OR PAIN  UNUSUAL VAGINAL DISCHARGE OR ITCHING   Items with * indicate a potential emergency and should be followed up as soon as possible or go to the Emergency Department if any problems should occur.  Please show the CHEMOTHERAPY ALERT CARD or IMMUNOTHERAPY ALERT CARD at check-in to the Emergency Department and triage nurse.  Should you have questions after your visit or need to cancel  or reschedule your appointment, please contact [facility] [phone]  and follow the prompts.  Office hours are 8:00 a.m. to 4:30 p.m. Monday - Friday. Please note that voicemails left after 4:00 p.m. may not be returned until the following business day.  We are closed weekends and major holidays. You have access to a nurse at all times for urgent questions. Please call the main number to the clinic [phone] and follow the prompts.  For any non-urgent questions, you may also contact your provider using MyChart. We now offer e-Visits for anyone 18 and older to request care online for non-urgent symptoms. For details visit [email or web address].   Also download the MyChart app! Go to the app store, search "MyChart", open the app, select [facility], and log in with your MyChart username and password.  Due to Covid, a mask is required upon entering the hospital/clinic. If you do not have a mask, one will be given to you upon arrival. For doctor visits, patients may have 1 support person aged 18 or older with them. For treatment visits, patients cannot have anyone with them due to current Covid guidelines and our immunocompromised population.  

## 2022-01-29 NOTE — Progress Notes (Signed)
Labs reviewed today, will give treatment per parameters. Will give addition potassium and magnesium per MD orders.   Treatment given per orders. Patient tolerated it well without problems. Vitals stable and discharged home from clinic ambulatory. Follow up as scheduled.

## 2022-02-03 ENCOUNTER — Other Ambulatory Visit (HOSPITAL_COMMUNITY): Payer: Self-pay

## 2022-02-03 DIAGNOSIS — C9000 Multiple myeloma not having achieved remission: Secondary | ICD-10-CM

## 2022-02-03 MED ORDER — LENALIDOMIDE 20 MG PO CAPS: ORAL_CAPSULE | ORAL | 0 refills | Status: DC

## 2022-02-03 NOTE — Telephone Encounter (Signed)
Chart reviewed. Revlimid refilled per last office note with Dr. Katragadda.  

## 2022-02-05 ENCOUNTER — Inpatient Hospital Stay (HOSPITAL_COMMUNITY): Payer: Medicare Other

## 2022-02-05 ENCOUNTER — Other Ambulatory Visit: Payer: Self-pay

## 2022-02-05 VITALS — BP 94/53 | HR 74 | Temp 97.7°F | Resp 18 | Wt 148.2 lb

## 2022-02-05 DIAGNOSIS — E538 Deficiency of other specified B group vitamins: Secondary | ICD-10-CM

## 2022-02-05 DIAGNOSIS — C9000 Multiple myeloma not having achieved remission: Secondary | ICD-10-CM

## 2022-02-05 DIAGNOSIS — Z5112 Encounter for antineoplastic immunotherapy: Secondary | ICD-10-CM | POA: Diagnosis not present

## 2022-02-05 LAB — COMPREHENSIVE METABOLIC PANEL
ALT: 10 U/L (ref 0–44)
AST: 12 U/L — ABNORMAL LOW (ref 15–41)
Albumin: 3.3 g/dL — ABNORMAL LOW (ref 3.5–5.0)
Alkaline Phosphatase: 86 U/L (ref 38–126)
Anion gap: 7 (ref 5–15)
BUN: 12 mg/dL (ref 8–23)
CO2: 29 mmol/L (ref 22–32)
Calcium: 9.1 mg/dL (ref 8.9–10.3)
Chloride: 101 mmol/L (ref 98–111)
Creatinine, Ser: 1.06 mg/dL — ABNORMAL HIGH (ref 0.44–1.00)
GFR, Estimated: 53 mL/min — ABNORMAL LOW (ref >60)
Glucose, Bld: 82 mg/dL (ref 70–99)
Potassium: 2.4 mmol/L — CL (ref 3.5–5.1)
Sodium: 137 mmol/L (ref 135–145)
Total Bilirubin: 1.1 mg/dL (ref 0.3–1.2)
Total Protein: 5.9 g/dL — ABNORMAL LOW (ref 6.5–8.1)

## 2022-02-05 LAB — CBC WITH DIFFERENTIAL/PLATELET
Abs Immature Granulocytes: 0.01 10*3/uL (ref 0.00–0.07)
Basophils Absolute: 0 10*3/uL (ref 0.0–0.1)
Basophils Relative: 1 %
Eosinophils Absolute: 0.2 10*3/uL (ref 0.0–0.5)
Eosinophils Relative: 4 %
HCT: 28.6 % — ABNORMAL LOW (ref 36.0–46.0)
Hemoglobin: 9.5 g/dL — ABNORMAL LOW (ref 12.0–15.0)
Immature Granulocytes: 0 %
Lymphocytes Relative: 18 %
Lymphs Abs: 0.8 10*3/uL (ref 0.7–4.0)
MCH: 34.2 pg — ABNORMAL HIGH (ref 26.0–34.0)
MCHC: 33.2 g/dL (ref 30.0–36.0)
MCV: 102.9 fL — ABNORMAL HIGH (ref 80.0–100.0)
Monocytes Absolute: 0.3 10*3/uL (ref 0.1–1.0)
Monocytes Relative: 7 %
Neutro Abs: 2.9 10*3/uL (ref 1.7–7.7)
Neutrophils Relative %: 70 %
Platelets: 129 10*3/uL — ABNORMAL LOW (ref 150–400)
RBC: 2.78 MIL/uL — ABNORMAL LOW (ref 3.87–5.11)
RDW: 15 % (ref 11.5–15.5)
WBC: 4.1 10*3/uL (ref 4.0–10.5)
nRBC: 0 % (ref 0.0–0.2)

## 2022-02-05 LAB — MAGNESIUM: Magnesium: 1.4 mg/dL — ABNORMAL LOW (ref 1.7–2.4)

## 2022-02-05 MED ORDER — MAGNESIUM SULFATE 2 GM/50ML IV SOLN
2.0000 g | INTRAVENOUS | Status: AC
  Administered 2022-02-05 (×2): 2 g via INTRAVENOUS
  Filled 2022-02-05 (×2): qty 50

## 2022-02-05 MED ORDER — DEXAMETHASONE 4 MG PO TABS
20.0000 mg | ORAL_TABLET | Freq: Once | ORAL | Status: AC
  Administered 2022-02-05: 20 mg via ORAL
  Filled 2022-02-05: qty 5

## 2022-02-05 MED ORDER — SODIUM CHLORIDE 0.9 % IV SOLN: Freq: Once | INTRAVENOUS | Status: AC

## 2022-02-05 MED ORDER — BORTEZOMIB CHEMO SQ INJECTION 3.5 MG (2.5MG/ML)
1.3000 mg/m2 | Freq: Once | INTRAMUSCULAR | Status: AC
  Administered 2022-02-05: 2.25 mg via SUBCUTANEOUS
  Filled 2022-02-05: qty 0.9

## 2022-02-05 MED ORDER — PROCHLORPERAZINE MALEATE 10 MG PO TABS
10.0000 mg | ORAL_TABLET | Freq: Once | ORAL | Status: AC
  Administered 2022-02-05: 10 mg via ORAL
  Filled 2022-02-05: qty 1

## 2022-02-05 MED ORDER — POTASSIUM CHLORIDE CRYS ER 20 MEQ PO TBCR
40.0000 meq | EXTENDED_RELEASE_TABLET | ORAL | Status: AC
  Administered 2022-02-05 (×2): 40 meq via ORAL
  Filled 2022-02-05 (×2): qty 2

## 2022-02-05 MED ORDER — EPOETIN ALFA-EPBX 20000 UNIT/ML IJ SOLN
20000.0000 [IU] | Freq: Once | INTRAMUSCULAR | Status: AC
  Administered 2022-02-05: 20000 [IU] via SUBCUTANEOUS
  Filled 2022-02-05: qty 1

## 2022-02-05 MED ORDER — POTASSIUM CHLORIDE 10 MEQ/100ML IV SOLN
10.0000 meq | INTRAVENOUS | Status: AC
  Administered 2022-02-05 (×2): 10 meq via INTRAVENOUS
  Filled 2022-02-05 (×2): qty 100

## 2022-02-05 NOTE — Progress Notes (Signed)
Critical value alert:   Potassium 2.4 Dr. Delton Coombes aware, orders are in supportive therapy for replacement. Primary RN aware.

## 2022-02-05 NOTE — Progress Notes (Signed)
Patient presents today for Velcade and retacrit injections per providers order.  Vital signs and labs within parameters for treatment.  Potassium 2.4 and Magnesium 1.4, Dr. Delton Coombes notified.  Patient receiving Potassium 40 mEq PO, 20 mEq IV, 40 mEq PO, and 2 grams of IV Magnesium per protocol.  Peripheral IV started and blood return noted pre and post infusion.   Velcade, Retacrit, Magnesium,and Potassium given today per MD orders.  Tolerated infusion and PO Potassium without adverse affects.  Vital signs stable.  No complaints at this time.  Discharge from clinic ambulatory in stable condition.  Alert and oriented X 3.  Follow up with [facility] as scheduled.

## 2022-02-05 NOTE — Patient Instructions (Signed)
Brittany Archer  Discharge Instructions: Thank you for choosing [facility] to provide your oncology and hematology care.  If you have a lab appointment with the [facility], please come in thru the Main Entrance and check in at the main information desk.  Wear comfortable clothing and clothing appropriate for easy access to any Portacath or PICC line.   We strive to give you quality time with your provider. You may need to reschedule your appointment if you arrive late (15 or more minutes).  Arriving late affects you and other patients whose appointments are after yours.  Also, if you miss three or more appointments without notifying the office, you may be dismissed from the clinic at the providers discretion.      For prescription refill requests, have your pharmacy contact our office and allow 72 hours for refills to be completed.    Today you received the following chemotherapy and/or immunotherapy agents Retacrit, Velcade, Magnesium,Potassium      To help prevent nausea and vomiting after your treatment, we encourage you to take your nausea medication as directed.  BELOW ARE SYMPTOMS THAT SHOULD BE REPORTED IMMEDIATELY: *FEVER GREATER THAN 100.4 F (38 C) OR HIGHER *CHILLS OR SWEATING *NAUSEA AND VOMITING THAT IS NOT CONTROLLED WITH YOUR NAUSEA MEDICATION *UNUSUAL SHORTNESS OF BREATH *UNUSUAL BRUISING OR BLEEDING *URINARY PROBLEMS (pain or burning when urinating, or frequent urination) *BOWEL PROBLEMS (unusual diarrhea, constipation, pain near the anus) TENDERNESS IN MOUTH AND THROAT WITH OR WITHOUT PRESENCE OF ULCERS (sore throat, sores in mouth, or a toothache) UNUSUAL RASH, SWELLING OR PAIN  UNUSUAL VAGINAL DISCHARGE OR ITCHING   Items with * indicate a potential emergency and should be followed up as soon as possible or go to the Emergency Department if any problems should occur.  Please show the CHEMOTHERAPY ALERT CARD or IMMUNOTHERAPY ALERT CARD at  check-in to the Emergency Department and triage nurse.  Should you have questions after your visit or need to cancel or reschedule your appointment, please contact [facility] [phone]  and follow the prompts.  Office hours are 8:00 a.m. to 4:30 p.m. Monday - Friday. Please note that voicemails left after 4:00 p.m. may not be returned until the following business day.  We are closed weekends and major holidays. You have access to a nurse at all times for urgent questions. Please call the main number to the clinic [phone] and follow the prompts.  For any non-urgent questions, you may also contact your provider using MyChart. We now offer e-Visits for anyone 68 and older to request care online for non-urgent symptoms. For details visit [email or web address].   Also download the MyChart app! Go to the app store, search "MyChart", open the app, select [facility], and log in with your MyChart username and password.  Due to Covid, a mask is required upon entering the hospital/clinic. If you do not have a mask, one will be given to you upon arrival. For doctor visits, patients may have 1 support person aged 4 or older with them. For treatment visits, patients cannot have anyone with them due to current Covid guidelines and our immunocompromised population.

## 2022-02-05 NOTE — Progress Notes (Signed)
Dr. Delton Coombes aware and has reviewed.

## 2022-02-19 ENCOUNTER — Inpatient Hospital Stay (HOSPITAL_COMMUNITY): Payer: Medicare Other

## 2022-02-19 ENCOUNTER — Other Ambulatory Visit: Payer: Self-pay

## 2022-02-19 VITALS — BP 105/51 | HR 51 | Temp 98.3°F | Resp 17 | Ht 61.0 in | Wt 148.0 lb

## 2022-02-19 DIAGNOSIS — C9000 Multiple myeloma not having achieved remission: Secondary | ICD-10-CM

## 2022-02-19 DIAGNOSIS — Z5112 Encounter for antineoplastic immunotherapy: Secondary | ICD-10-CM | POA: Diagnosis not present

## 2022-02-19 DIAGNOSIS — E538 Deficiency of other specified B group vitamins: Secondary | ICD-10-CM

## 2022-02-19 LAB — CBC WITH DIFFERENTIAL/PLATELET
Abs Immature Granulocytes: 0.01 10*3/uL (ref 0.00–0.07)
Basophils Absolute: 0 10*3/uL (ref 0.0–0.1)
Basophils Relative: 1 %
Eosinophils Absolute: 0.1 10*3/uL (ref 0.0–0.5)
Eosinophils Relative: 3 %
HCT: 28 % — ABNORMAL LOW (ref 36.0–46.0)
Hemoglobin: 8.9 g/dL — ABNORMAL LOW (ref 12.0–15.0)
Immature Granulocytes: 0 %
Lymphocytes Relative: 28 %
Lymphs Abs: 0.8 10*3/uL (ref 0.7–4.0)
MCH: 32.1 pg (ref 26.0–34.0)
MCHC: 31.8 g/dL (ref 30.0–36.0)
MCV: 101.1 fL — ABNORMAL HIGH (ref 80.0–100.0)
Monocytes Absolute: 0.3 10*3/uL (ref 0.1–1.0)
Monocytes Relative: 11 %
Neutro Abs: 1.6 10*3/uL — ABNORMAL LOW (ref 1.7–7.7)
Neutrophils Relative %: 57 %
Platelets: 185 10*3/uL (ref 150–400)
RBC: 2.77 MIL/uL — ABNORMAL LOW (ref 3.87–5.11)
RDW: 15.3 % (ref 11.5–15.5)
WBC: 2.8 10*3/uL — ABNORMAL LOW (ref 4.0–10.5)
nRBC: 0 % (ref 0.0–0.2)

## 2022-02-19 LAB — COMPREHENSIVE METABOLIC PANEL
ALT: 8 U/L (ref 0–44)
AST: 12 U/L — ABNORMAL LOW (ref 15–41)
Albumin: 3.2 g/dL — ABNORMAL LOW (ref 3.5–5.0)
Alkaline Phosphatase: 69 U/L (ref 38–126)
Anion gap: 9 (ref 5–15)
BUN: 15 mg/dL (ref 8–23)
CO2: 27 mmol/L (ref 22–32)
Calcium: 8.6 mg/dL — ABNORMAL LOW (ref 8.9–10.3)
Chloride: 102 mmol/L (ref 98–111)
Creatinine, Ser: 1.35 mg/dL — ABNORMAL HIGH (ref 0.44–1.00)
GFR, Estimated: 39 mL/min — ABNORMAL LOW (ref >60)
Glucose, Bld: 148 mg/dL — ABNORMAL HIGH (ref 70–99)
Potassium: 2 mmol/L — CL (ref 3.5–5.1)
Sodium: 138 mmol/L (ref 135–145)
Total Bilirubin: 1 mg/dL (ref 0.3–1.2)
Total Protein: 6.2 g/dL — ABNORMAL LOW (ref 6.5–8.1)

## 2022-02-19 LAB — MAGNESIUM: Magnesium: 1.3 mg/dL — ABNORMAL LOW (ref 1.7–2.4)

## 2022-02-19 MED ORDER — CYANOCOBALAMIN 1000 MCG/ML IJ SOLN
1000.0000 ug | Freq: Once | INTRAMUSCULAR | Status: AC
  Administered 2022-02-19: 1000 ug via INTRAMUSCULAR
  Filled 2022-02-19: qty 1

## 2022-02-19 MED ORDER — SODIUM CHLORIDE 0.9 % IV SOLN: Freq: Once | INTRAVENOUS | Status: AC

## 2022-02-19 MED ORDER — DEXAMETHASONE 4 MG PO TABS
20.0000 mg | ORAL_TABLET | Freq: Once | ORAL | Status: AC
  Administered 2022-02-19: 20 mg via ORAL
  Filled 2022-02-19: qty 5

## 2022-02-19 MED ORDER — POTASSIUM CHLORIDE CRYS ER 20 MEQ PO TBCR
40.0000 meq | EXTENDED_RELEASE_TABLET | ORAL | Status: AC
  Administered 2022-02-19 (×2): 40 meq via ORAL
  Filled 2022-02-19 (×2): qty 2

## 2022-02-19 MED ORDER — MAGNESIUM SULFATE 2 GM/50ML IV SOLN
2.0000 g | INTRAVENOUS | Status: AC
  Administered 2022-02-19 (×2): 2 g via INTRAVENOUS
  Filled 2022-02-19 (×2): qty 50

## 2022-02-19 MED ORDER — EPOETIN ALFA-EPBX 20000 UNIT/ML IJ SOLN
20000.0000 [IU] | Freq: Once | INTRAMUSCULAR | Status: AC
  Administered 2022-02-19: 20000 [IU] via SUBCUTANEOUS
  Filled 2022-02-19: qty 1

## 2022-02-19 MED ORDER — PROCHLORPERAZINE MALEATE 10 MG PO TABS
10.0000 mg | ORAL_TABLET | Freq: Once | ORAL | Status: AC
  Administered 2022-02-19: 10 mg via ORAL
  Filled 2022-02-19: qty 1

## 2022-02-19 MED ORDER — BORTEZOMIB CHEMO SQ INJECTION 3.5 MG (2.5MG/ML)
1.3000 mg/m2 | Freq: Once | INTRAMUSCULAR | Status: AC
  Administered 2022-02-19: 2.25 mg via SUBCUTANEOUS
  Filled 2022-02-19: qty 0.9

## 2022-02-19 MED ORDER — POTASSIUM CHLORIDE 10 MEQ/100ML IV SOLN
10.0000 meq | INTRAVENOUS | Status: AC
  Administered 2022-02-19 (×2): 10 meq via INTRAVENOUS
  Filled 2022-02-19 (×2): qty 100

## 2022-02-19 NOTE — Progress Notes (Signed)
CRITICAL VALUE STICKER  CRITICAL VALUE: Potassium 2.0  RECEIVER (on-site recipient of call): Ronnald Nian, RN  [place] NOTIFIED: 02/19/2022 1010  MESSENGER (representative from lab): [employer], pharmacy tech  MD NOTIFIED: Tomie China, MD  TIME OF NOTIFICATION: [phone]

## 2022-02-19 NOTE — Patient Instructions (Signed)
[place]  Discharge Instructions: Thank you for choosing [facility] to provide your oncology and hematology care.  If you have a lab appointment with the [facility], please come in thru the Main Entrance and check in at the main information desk.  Wear comfortable clothing and clothing appropriate for easy access to any Portacath or PICC line.   We strive to give you quality time with your provider. You may need to reschedule your appointment if you arrive late (15 or more minutes).  Arriving late affects you and other patients whose appointments are after yours.  Also, if you miss three or more appointments without notifying the office, you may be dismissed from the clinic at the providers discretion.      For prescription refill requests, have your pharmacy contact our office and allow 72 hours for refills to be completed.    Today you received the following chemotherapy and/or immunotherapy agents Velcade, Retacrit, and B12 injections. 4g IV Mg and 20 IV potassium chloride and 62mEq p.o potassium x 2 doses.   To help prevent nausea and vomiting after your treatment, we encourage you to take your nausea medication as directed.  BELOW ARE SYMPTOMS THAT SHOULD BE REPORTED IMMEDIATELY: *FEVER GREATER THAN 100.4 F (38 C) OR HIGHER *CHILLS OR SWEATING *NAUSEA AND VOMITING THAT IS NOT CONTROLLED WITH YOUR NAUSEA MEDICATION *UNUSUAL SHORTNESS OF BREATH *UNUSUAL BRUISING OR BLEEDING *URINARY PROBLEMS (pain or burning when urinating, or frequent urination) *BOWEL PROBLEMS (unusual diarrhea, constipation, pain near the anus) TENDERNESS IN MOUTH AND THROAT WITH OR WITHOUT PRESENCE OF ULCERS (sore throat, sores in mouth, or a toothache) UNUSUAL RASH, SWELLING OR PAIN  UNUSUAL VAGINAL DISCHARGE OR ITCHING   Items with * indicate a potential emergency and should be followed up as soon as possible or go to the Emergency Department if any problems should  occur.  Please show the CHEMOTHERAPY ALERT CARD or IMMUNOTHERAPY ALERT CARD at check-in to the Emergency Department and triage nurse.  Should you have questions after your visit or need to cancel or reschedule your appointment, please contact [facility] [phone]  and follow the prompts.  Office hours are 8:00 a.m. to 4:30 p.m. Monday - Friday. Please note that voicemails left after 4:00 p.m. may not be returned until the following business day.  We are closed weekends and major holidays. You have access to a nurse at all times for urgent questions. Please call the main number to the clinic [phone] and follow the prompts.  For any non-urgent questions, you may also contact your provider using MyChart. We now offer e-Visits for anyone 54 and older to request care online for non-urgent symptoms. For details visit [email or web address].   Also download the MyChart app! Go to the app store, search "MyChart", open the app, select [facility], and log in with your MyChart username and password.  Due to Covid, a mask is required upon entering the hospital/clinic. If you do not have a mask, one will be given to you upon arrival. For doctor visits, patients may have 1 support person aged 69 or older with them. For treatment visits, patients cannot have anyone with them due to current Covid guidelines and our immunocompromised population.

## 2022-02-19 NOTE — Progress Notes (Signed)
Pt presents today for Velcade, B12, and Retacrit injection per provider's order. Vitals signs and  labs WNL for treatment today. Pt's potassium was 2.0 and magnesium was 1.3 today. MD made aware and stated to give 26mEq IV potassium chloride and 73mEq potassium chloride p.o x 2 doses and give magnesium sulfate 4g IV.  Peripheral IV started with good blood return pre and post infusion.  Velcade, B12, Retacrit injections and 43mEq IV potassium chloride and 40 mEq p.o x 2 doses and  4 g IV magnesium sulfate given today per MD orders. Tolerated infusion without adverse affects. Vital signs stable. No complaints at this time. Discharged from clinic ambulatory in stable condition. Alert and oriented x 3. F/U with [facility] as scheduled.

## 2022-02-26 ENCOUNTER — Inpatient Hospital Stay (HOSPITAL_COMMUNITY): Payer: Medicare Other

## 2022-02-26 ENCOUNTER — Inpatient Hospital Stay (HOSPITAL_COMMUNITY): Payer: Medicare Other | Attending: Hematology

## 2022-02-26 ENCOUNTER — Ambulatory Visit (HOSPITAL_COMMUNITY): Payer: Medicare Other

## 2022-02-26 ENCOUNTER — Other Ambulatory Visit (HOSPITAL_COMMUNITY): Payer: Medicare Other

## 2022-02-26 ENCOUNTER — Other Ambulatory Visit (HOSPITAL_COMMUNITY): Payer: Self-pay

## 2022-02-26 ENCOUNTER — Other Ambulatory Visit: Payer: Self-pay

## 2022-02-26 VITALS — BP 117/50 | HR 64 | Temp 97.6°F | Resp 18 | Ht 61.0 in | Wt 145.0 lb

## 2022-02-26 DIAGNOSIS — R7989 Other specified abnormal findings of blood chemistry: Secondary | ICD-10-CM | POA: Diagnosis not present

## 2022-02-26 DIAGNOSIS — D631 Anemia in chronic kidney disease: Secondary | ICD-10-CM | POA: Diagnosis not present

## 2022-02-26 DIAGNOSIS — Z5112 Encounter for antineoplastic immunotherapy: Secondary | ICD-10-CM | POA: Diagnosis present

## 2022-02-26 DIAGNOSIS — E538 Deficiency of other specified B group vitamins: Secondary | ICD-10-CM

## 2022-02-26 DIAGNOSIS — N1832 Chronic kidney disease, stage 3b: Secondary | ICD-10-CM | POA: Insufficient documentation

## 2022-02-26 DIAGNOSIS — C9000 Multiple myeloma not having achieved remission: Secondary | ICD-10-CM | POA: Insufficient documentation

## 2022-02-26 DIAGNOSIS — Z79899 Other long term (current) drug therapy: Secondary | ICD-10-CM | POA: Insufficient documentation

## 2022-02-26 LAB — CBC WITH DIFFERENTIAL/PLATELET
Abs Immature Granulocytes: 0.02 10*3/uL (ref 0.00–0.07)
Basophils Absolute: 0 10*3/uL (ref 0.0–0.1)
Basophils Relative: 1 %
Eosinophils Absolute: 0 10*3/uL (ref 0.0–0.5)
Eosinophils Relative: 1 %
HCT: 31.1 % — ABNORMAL LOW (ref 36.0–46.0)
Hemoglobin: 10 g/dL — ABNORMAL LOW (ref 12.0–15.0)
Immature Granulocytes: 0 %
Lymphocytes Relative: 10 %
Lymphs Abs: 0.5 10*3/uL — ABNORMAL LOW (ref 0.7–4.0)
MCH: 33 pg (ref 26.0–34.0)
MCHC: 32.2 g/dL (ref 30.0–36.0)
MCV: 102.6 fL — ABNORMAL HIGH (ref 80.0–100.0)
Monocytes Absolute: 0.7 10*3/uL (ref 0.1–1.0)
Monocytes Relative: 13 %
Neutro Abs: 3.9 10*3/uL (ref 1.7–7.7)
Neutrophils Relative %: 75 %
Platelets: 153 10*3/uL (ref 150–400)
RBC: 3.03 MIL/uL — ABNORMAL LOW (ref 3.87–5.11)
RDW: 16.2 % — ABNORMAL HIGH (ref 11.5–15.5)
WBC: 5.2 10*3/uL (ref 4.0–10.5)
nRBC: 0 % (ref 0.0–0.2)

## 2022-02-26 LAB — COMPREHENSIVE METABOLIC PANEL
ALT: 9 U/L (ref 0–44)
AST: 12 U/L — ABNORMAL LOW (ref 15–41)
Albumin: 3.3 g/dL — ABNORMAL LOW (ref 3.5–5.0)
Alkaline Phosphatase: 77 U/L (ref 38–126)
Anion gap: 9 (ref 5–15)
BUN: 21 mg/dL (ref 8–23)
CO2: 26 mmol/L (ref 22–32)
Calcium: 9 mg/dL (ref 8.9–10.3)
Chloride: 102 mmol/L (ref 98–111)
Creatinine, Ser: 1.36 mg/dL — ABNORMAL HIGH (ref 0.44–1.00)
GFR, Estimated: 39 mL/min — ABNORMAL LOW (ref >60)
Glucose, Bld: 204 mg/dL — ABNORMAL HIGH (ref 70–99)
Potassium: 2.2 mmol/L — CL (ref 3.5–5.1)
Sodium: 137 mmol/L (ref 135–145)
Total Bilirubin: 2.1 mg/dL — ABNORMAL HIGH (ref 0.3–1.2)
Total Protein: 6.4 g/dL — ABNORMAL LOW (ref 6.5–8.1)

## 2022-02-26 LAB — MAGNESIUM: Magnesium: 1.5 mg/dL — ABNORMAL LOW (ref 1.7–2.4)

## 2022-02-26 LAB — LACTATE DEHYDROGENASE: LDH: 121 U/L (ref 98–192)

## 2022-02-26 MED ORDER — BORTEZOMIB CHEMO SQ INJECTION 3.5 MG (2.5MG/ML)
1.3000 mg/m2 | Freq: Once | INTRAMUSCULAR | Status: AC
  Administered 2022-02-26: 2.25 mg via SUBCUTANEOUS
  Filled 2022-02-26: qty 0.9

## 2022-02-26 MED ORDER — POTASSIUM CHLORIDE 10 MEQ/100ML IV SOLN
10.0000 meq | INTRAVENOUS | Status: AC
  Administered 2022-02-26 (×2): 10 meq via INTRAVENOUS
  Filled 2022-02-26 (×2): qty 100

## 2022-02-26 MED ORDER — EPOETIN ALFA-EPBX 20000 UNIT/ML IJ SOLN: 20000.0000 [IU] | Freq: Once | INTRAMUSCULAR | Status: DC

## 2022-02-26 MED ORDER — SODIUM CHLORIDE 0.9 % IV SOLN: Freq: Once | INTRAVENOUS | Status: AC

## 2022-02-26 MED ORDER — POTASSIUM CHLORIDE CRYS ER 20 MEQ PO TBCR
40.0000 meq | EXTENDED_RELEASE_TABLET | ORAL | Status: AC
  Administered 2022-02-26 (×2): 40 meq via ORAL
  Filled 2022-02-26 (×2): qty 2

## 2022-02-26 MED ORDER — LENALIDOMIDE 20 MG PO CAPS: ORAL_CAPSULE | ORAL | 0 refills | Status: DC

## 2022-02-26 MED ORDER — PROCHLORPERAZINE MALEATE 10 MG PO TABS
10.0000 mg | ORAL_TABLET | Freq: Once | ORAL | Status: AC
  Administered 2022-02-26: 10 mg via ORAL
  Filled 2022-02-26: qty 1

## 2022-02-26 MED ORDER — MAGNESIUM SULFATE 2 GM/50ML IV SOLN
2.0000 g | INTRAVENOUS | Status: AC
  Administered 2022-02-26: 2 g via INTRAVENOUS
  Filled 2022-02-26: qty 50

## 2022-02-26 MED ORDER — DEXAMETHASONE 4 MG PO TABS
20.0000 mg | ORAL_TABLET | Freq: Once | ORAL | Status: AC
  Administered 2022-02-26: 20 mg via ORAL
  Filled 2022-02-26: qty 5

## 2022-02-26 NOTE — Progress Notes (Signed)
CRITICAL VALUE ALERT ?Critical value received:  K+ 2.2 ?Date of notification:  02-26-22 ?Time of notification: 1018 ?Critical value read back:  Yes.   ?Nurse who received alert:  C. Dosia Yodice RN ?MD notified time and response:  1020 will give meds per orders.   ? ?

## 2022-02-26 NOTE — Patient Instructions (Signed)
[facility]  Discharge Instructions: ?Thank you for choosing [facility] to provide your oncology and hematology care.  ?If you have a lab appointment with the [facility], please come in thru the Main Entrance and check in at the main information desk. ? ?Wear comfortable clothing and clothing appropriate for easy access to any Portacath or PICC line.  ? ?We strive to give you quality time with your provider. You may need to reschedule your appointment if you arrive late (15 or more minutes).  Arriving late affects you and other patients whose appointments are after yours.  Also, if you miss three or more appointments without notifying the office, you may be dismissed from the clinic at the provider?s discretion.    ?  ?For prescription refill requests, have your pharmacy contact our office and allow 72 hours for refills to be completed.   ? ?Today you received the following chemotherapy and/or immunotherapy agents Velcade, 94mEq potassium p.o x 2 doses, 13mEq IV potassium, and 2g IV magnesium sulfate. ?  ?To help prevent nausea and vomiting after your treatment, we encourage you to take your nausea medication as directed. ? ?BELOW ARE SYMPTOMS THAT SHOULD BE REPORTED IMMEDIATELY: ?*FEVER GREATER THAN 100.4 F (38 ?C) OR HIGHER ?*CHILLS OR SWEATING ?*NAUSEA AND VOMITING THAT IS NOT CONTROLLED WITH YOUR NAUSEA MEDICATION ?*UNUSUAL SHORTNESS OF BREATH ?*UNUSUAL BRUISING OR BLEEDING ?*URINARY PROBLEMS (pain or burning when urinating, or frequent urination) ?*BOWEL PROBLEMS (unusual diarrhea, constipation, pain near the anus) ?TENDERNESS IN MOUTH AND THROAT WITH OR WITHOUT PRESENCE OF ULCERS (sore throat, sores in mouth, or a toothache) ?UNUSUAL RASH, SWELLING OR PAIN  ?UNUSUAL VAGINAL DISCHARGE OR ITCHING  ? ?Items with * indicate a potential emergency and should be followed up as soon as possible or go to the Emergency Department if any problems should occur. ? ?Please show the  CHEMOTHERAPY ALERT CARD or IMMUNOTHERAPY ALERT CARD at check-in to the Emergency Department and triage nurse. ? ?Should you have questions after your visit or need to cancel or reschedule your appointment, please contact [facility] [phone]  and follow the prompts.  Office hours are 8:00 a.m. to 4:30 p.m. Monday - Friday. Please note that voicemails left after 4:00 p.m. may not be returned until the following business day.  We are closed weekends and major holidays. You have access to a nurse at all times for urgent questions. Please call the main number to the clinic [phone] and follow the prompts. ? ?For any non-urgent questions, you may also contact your provider using MyChart. We now offer e-Visits for anyone 28 and older to request care online for non-urgent symptoms. For details visit [email or web address]. ?  ?Also download the MyChart app! Go to the app store, search "MyChart", open the app, select [facility], and log in with your MyChart username and password. ? ?Due to Covid, a mask is required upon entering the hospital/clinic. If you do not have a mask, one will be given to you upon arrival. For doctor visits, patients may have 1 support person aged 59 or older with them. For treatment visits, patients cannot have anyone with them due to current Covid guidelines and our immunocompromised population.  ?

## 2022-02-26 NOTE — Progress Notes (Signed)
Pt presents today for Velcade and Retacrit injections per provider's order. Vital signs and other labs WNL for treatment today. Bilirubin 2.1 today MD made aware okay to proceed with treatment today per Dr.K. Retacrit injection held today due to hemoglobin of 10. ? ?Pt's potassium was 2.2 and magnesium was 1.5 today. Pt will receive 109mEq potassium p.o x2 doses, 84mEq IV potassium chloride, and 2g IV of magnesium sulfate. ? ?Velcade, 46mEq potassium chloride p.o x 2 doses, 51mEq IV potassium chloride and 2g IV of magnesium sulfate given today per MD orders. Tolerated infusion without adverse affects. Vital signs stable. No complaints at this time. Discharged from clinic ambulatory in stable condition. Alert and oriented x 3. F/U with [facility] as scheduled.   ? ? ?

## 2022-02-26 NOTE — Progress Notes (Signed)
Ok to treat today with tbili 2.1 ? ?T.O. Dr Rhys Martini, PharmD ?

## 2022-02-26 NOTE — Telephone Encounter (Signed)
Chart reviewed. Revlimid refilled per last office note with Dr. Katragadda.  

## 2022-02-27 LAB — KAPPA/LAMBDA LIGHT CHAINS
Kappa free light chain: 33 mg/L — ABNORMAL HIGH (ref 3.3–19.4)
Kappa, lambda light chain ratio: 2.26 — ABNORMAL HIGH (ref 0.26–1.65)
Lambda free light chains: 14.6 mg/L (ref 5.7–26.3)

## 2022-03-02 LAB — PROTEIN ELECTROPHORESIS, SERUM
A/G Ratio: 1.1 (ref 0.7–1.7)
Albumin ELP: 3 g/dL (ref 2.9–4.4)
Alpha-1-Globulin: 0.3 g/dL (ref 0.0–0.4)
Alpha-2-Globulin: 0.8 g/dL (ref 0.4–1.0)
Beta Globulin: 0.9 g/dL (ref 0.7–1.3)
Gamma Globulin: 0.7 g/dL (ref 0.4–1.8)
Globulin, Total: 2.7 g/dL (ref 2.2–3.9)
Total Protein ELP: 5.7 g/dL — ABNORMAL LOW (ref 6.0–8.5)

## 2022-03-05 ENCOUNTER — Inpatient Hospital Stay (HOSPITAL_BASED_OUTPATIENT_CLINIC_OR_DEPARTMENT_OTHER): Payer: Medicare Other | Admitting: Hematology

## 2022-03-05 ENCOUNTER — Other Ambulatory Visit: Payer: Self-pay

## 2022-03-05 ENCOUNTER — Inpatient Hospital Stay (HOSPITAL_COMMUNITY): Payer: Medicare Other

## 2022-03-05 VITALS — BP 91/67 | HR 78 | Temp 97.8°F | Resp 18 | Wt 137.6 lb

## 2022-03-05 DIAGNOSIS — Z5112 Encounter for antineoplastic immunotherapy: Secondary | ICD-10-CM | POA: Diagnosis not present

## 2022-03-05 DIAGNOSIS — E538 Deficiency of other specified B group vitamins: Secondary | ICD-10-CM | POA: Diagnosis not present

## 2022-03-05 DIAGNOSIS — D649 Anemia, unspecified: Secondary | ICD-10-CM | POA: Diagnosis not present

## 2022-03-05 DIAGNOSIS — C9000 Multiple myeloma not having achieved remission: Secondary | ICD-10-CM

## 2022-03-05 LAB — COMPREHENSIVE METABOLIC PANEL
ALT: 9 U/L (ref 0–44)
AST: 10 U/L — ABNORMAL LOW (ref 15–41)
Albumin: 3 g/dL — ABNORMAL LOW (ref 3.5–5.0)
Alkaline Phosphatase: 79 U/L (ref 38–126)
Anion gap: 10 (ref 5–15)
BUN: 61 mg/dL — ABNORMAL HIGH (ref 8–23)
CO2: 26 mmol/L (ref 22–32)
Calcium: 10 mg/dL (ref 8.9–10.3)
Chloride: 99 mmol/L (ref 98–111)
Creatinine, Ser: 2.05 mg/dL — ABNORMAL HIGH (ref 0.44–1.00)
GFR, Estimated: 24 mL/min — ABNORMAL LOW (ref >60)
Glucose, Bld: 157 mg/dL — ABNORMAL HIGH (ref 70–99)
Potassium: 3.7 mmol/L (ref 3.5–5.1)
Sodium: 135 mmol/L (ref 135–145)
Total Bilirubin: 0.6 mg/dL (ref 0.3–1.2)
Total Protein: 6.8 g/dL (ref 6.5–8.1)

## 2022-03-05 LAB — MAGNESIUM: Magnesium: 2.2 mg/dL (ref 1.7–2.4)

## 2022-03-05 LAB — CBC WITH DIFFERENTIAL/PLATELET
Abs Immature Granulocytes: 0.04 10*3/uL (ref 0.00–0.07)
Basophils Absolute: 0 10*3/uL (ref 0.0–0.1)
Basophils Relative: 0 %
Eosinophils Absolute: 0.1 10*3/uL (ref 0.0–0.5)
Eosinophils Relative: 3 %
HCT: 32.1 % — ABNORMAL LOW (ref 36.0–46.0)
Hemoglobin: 10.2 g/dL — ABNORMAL LOW (ref 12.0–15.0)
Immature Granulocytes: 1 %
Lymphocytes Relative: 17 %
Lymphs Abs: 0.6 10*3/uL — ABNORMAL LOW (ref 0.7–4.0)
MCH: 32.8 pg (ref 26.0–34.0)
MCHC: 31.8 g/dL (ref 30.0–36.0)
MCV: 103.2 fL — ABNORMAL HIGH (ref 80.0–100.0)
Monocytes Absolute: 0.4 10*3/uL (ref 0.1–1.0)
Monocytes Relative: 10 %
Neutro Abs: 2.4 10*3/uL (ref 1.7–7.7)
Neutrophils Relative %: 69 %
Platelets: 165 10*3/uL (ref 150–400)
RBC: 3.11 MIL/uL — ABNORMAL LOW (ref 3.87–5.11)
RDW: 16.4 % — ABNORMAL HIGH (ref 11.5–15.5)
WBC: 3.6 10*3/uL — ABNORMAL LOW (ref 4.0–10.5)
nRBC: 0 % (ref 0.0–0.2)

## 2022-03-05 MED ORDER — SODIUM CHLORIDE 0.9 % IV SOLN: INTRAVENOUS | Status: DC

## 2022-03-05 NOTE — Progress Notes (Signed)
Treatment held today per MD. No retacrit needed today per protocol. Will give hydration per orders.  ? ? ?Patient and niece given instructions about holding revlimid and HCTZ BP med for two weeks till follow up with MD . Encouraged niece to let someone know they need to be present with patient when  she comes for her follow up with MD.  She understood instructions.  ? ?Treatment given per orders. Patient tolerated it well without problems. Vitals stable and discharged home from clinic ambulatory. Follow up as scheduled. ? ?

## 2022-03-05 NOTE — Patient Instructions (Addendum)
[facility] at [facility] ?Discharge Instructions ? ? ?You were seen and examined today by Dr. Delton Coombes. ? ?He reviewed your lab work.  We will hold your treatment today as your lab work is out of parameters, weight loss, and low blood pressure. ? ?HOLD the following medications:  Lisinopril/HCTZ and Revlimid until we see you back in 2 weeks.  ? ?Return as scheduled.  ? ? ?Thank you for choosing [facility] at [facility] to provide your oncology and hematology care.  To afford each patient quality time with our provider, please arrive at least 15 minutes before your scheduled appointment time.  ? ?If you have a lab appointment with the [facility] please come in thru the Main Entrance and check in at the main information desk. ? ?You need to re-schedule your appointment should you arrive 10 or more minutes late.  We strive to give you quality time with our providers, and arriving late affects you and other patients whose appointments are after yours.  Also, if you no show three or more times for appointments you may be dismissed from the clinic at the providers discretion.     ?Again, thank you for choosing [facility].  Our hope is that these requests will decrease the amount of time that you wait before being seen by our physicians.       ?_____________________________________________________________ ? ?Should you have questions after your visit to [facility], please contact our office at [phone] and follow the prompts.  Our office hours are 8:00 a.m. and 4:30 p.m. Monday - Friday.  Please note that voicemails left after 4:00 p.m. may not be returned until the following business day.  We are closed weekends and major holidays.  You do have access to a nurse 24-7, just call the main number to the clinic [phone] and do not press any options, hold on the line and a nurse will answer the phone.   ? ?For prescription refill  requests, have your pharmacy contact our office and allow 72 hours.   ? ?Due to Covid, you will need to wear a mask upon entering the hospital. If you do not have a mask, a mask will be given to you at the Main Entrance upon arrival. For doctor visits, patients may have 1 support person age 84 or older with them. For treatment visits, patients can not have anyone with them due to social distancing guidelines and our immunocompromised population.  ? ?   ?

## 2022-03-05 NOTE — Progress Notes (Signed)
Patient is taking Revlimid as prescribed.  Pt has not missed any doses and reports no side effects at this time.   ?

## 2022-03-05 NOTE — Progress Notes (Signed)
[facility] [address] [place] [address]   CLINIC:  Medical Oncology/Hematology  PCP:  [name], MD [address] / [place] [address] [phone]   REASON FOR VISIT:  Follow-up for multiple myeloma  PRIOR THERAPY: none  NGS Results: not done  CURRENT THERAPY: Velcade 3/4 weeks; Revlimid 20 mg 2/3 weeks  BRIEF ONCOLOGIC HISTORY:  Oncology History  Multiple myeloma not having achieved remission ([facility])  01/20/2018 Initial Diagnosis   Multiple myeloma not having achieved remission ([facility])   01/26/2018 -  Chemotherapy   Patient is on Treatment Plan : MYELOMA  RVD SQ (Bortezomib d 1,8,15 ) q28d x 4 cycles       CANCER STAGING:  Cancer Staging  No matching staging information was found for the patient.  INTERVAL HISTORY:  Brittany Archer, a 81 y.o. female, returns for routine follow-up and consideration for next cycle of chemotherapy. Brittany Archer was last seen on 01/22/2022.  Due for day #15 cycle #52 of Velcade today.   Overall, she tells me she has been feeling pretty well. She denies numbness/tingling. She has lost 15 lbs since 01/22/22. She denies light-headedness. She reports 1 BM daily, and she denies n/v/d.   Overall, she is not ready for next cycle of chemo today.   REVIEW OF SYSTEMS:  Review of Systems  Constitutional:  Positive for unexpected weight change (-15 lbs). Negative for appetite change and fatigue.  Gastrointestinal:  Negative for diarrhea, nausea and vomiting.  Neurological:  Negative for light-headedness and numbness.  All other systems reviewed and are negative.  PAST MEDICAL/SURGICAL HISTORY:  Past Medical History:  Diagnosis Date   Breast cancer ([facility])    left breast/ 2008/ surg/ rad tx   Coronary artery disease    Diabetes mellitus    Past Surgical History:  Procedure Laterality Date   ABDOMINAL HYSTERECTOMY     BREAST SURGERY     DEBRIDEMENT MANDIBLE N/A 02/22/2020   Procedure: INCISION AND  DRAINAGE WITH DEBRIDEMENT MANDIBLE;  Surgeon: Michael Litter, DMD;  Location: [facility];  Service: Oral Surgery;  Laterality: N/A;   DEBRIDEMENT MANDIBLE Right 03/19/2021   Procedure: DEBRIDEMENT OF BONE RIGHT INTERIOR  MANDIBLE;  Surgeon: Michael Litter, DMD;  Location: [facility];  Service: Oral Surgery;  Laterality: Right;   EYE SURGERY  2021   cataract removals    TOOTH EXTRACTION N/A 02/22/2020   Procedure: DENTAL RESTORATION/EXTRACTIONS;  Surgeon: Michael Litter, DMD;  Location: [facility];  Service: Oral Surgery;  Laterality: N/A;  DENTAL KIT REQUESTED    SOCIAL HISTORY:  Social History   Socioeconomic History   Marital status: Divorced    Spouse name: Not on file   Number of children: Not on file   Years of education: Not on file   Highest education level: Not on file  Occupational History   Not on file  Tobacco Use   Smoking status: Never   Smokeless tobacco: Never  Vaping Use   Vaping Use: Never used  Substance and Sexual Activity   Alcohol use: No   Drug use: No   Sexual activity: Yes    Birth control/protection: Surgical  Other Topics Concern   Not on file  Social History Narrative   Not on file   Social Determinants of Health   Financial Resource Strain: Not on file  Food Insecurity: Not on file  Transportation Needs: Not on file  Physical Activity: Not on file  Stress: Not on file  Social  Connections: Not on file  Intimate Partner Violence: Not on file    FAMILY HISTORY:  Family History  Problem Relation Age of Onset   Obesity Sister     CURRENT MEDICATIONS:  Current Outpatient Medications  Medication Sig Dispense Refill   acyclovir (ZOVIRAX) 400 MG tablet TAKE 1 TABLET BY MOUTH TWICE DAILY (Patient taking differently: Take 400 mg by mouth 2 (two) times daily.) 60 tablet 11   aspirin 81 MG tablet Take 81 mg by mouth daily.     bortezomib IV (VELCADE) 3.5 MG injection Inject 3.5 mg into the vein once a week. weekly     chlorhexidine (PERIDEX) 0.12 % solution Use as  directed 15 mLs in the mouth or throat 3 (three) times daily.     dexamethasone (DECADRON) 4 MG tablet Take 10 tablets (40 mg) on days 1, 8, and 15 of chemo. Repeat every 21 days. (Patient taking differently: Take 4 mg by mouth See admin instructions. Take 10 tablets (40 mg) on days 1, 8, and 15 of chemo. Repeat every 21 days.) 30 tablet 3   glipiZIDE (GLUCOTROL) 5 MG tablet Take 5 mg by mouth daily before breakfast.      glucose blood (ACCU-CHEK AVIVA PLUS) test strip CHECK BLOOD SUGAR ONCE DAILY     lenalidomide (REVLIMID) 20 MG capsule Take 20 mg capsule by mouth once daily for 14 days on, and 7 days off of a 21 day cycle. 14 capsule 0   lisinopril-hydrochlorothiazide (PRINZIDE,ZESTORETIC) 20-25 MG tablet Take 1 tablet by mouth daily.     magnesium oxide (MAG-OX) 400 (240 Mg) MG tablet Take 1 tablet (400 mg total) by mouth in the morning, at noon, and at bedtime. 90 tablet 6   metFORMIN (GLUCOPHAGE) 1000 MG tablet Take 1,000 mg by mouth 2 (two) times daily with a meal.     metFORMIN (GLUCOPHAGE) 500 MG tablet Take 500 mg by mouth 2 (two) times daily.     Potassium Acetate POWD Take 20 mEq by mouth daily. 12000 g 5   No current facility-administered medications for this visit.    ALLERGIES:  Allergies  Allergen Reactions   Other Other (See Comments)   Seasonal Ic [Cholestatin] Other (See Comments)    Sneezing, watery eyes   Motrin [Ibuprofen] Rash    PHYSICAL EXAM:  Performance status (ECOG): 1 - Symptomatic but completely ambulatory  Vitals:   03/05/22 1006  BP: 91/67  Pulse: 78  Resp: 18  Temp: 97.8 F (36.6 C)  SpO2: 98%   Wt Readings from Last 3 Encounters:  03/05/22 137 lb 9.1 oz (62.4 kg)  02/26/22 145 lb (65.8 kg)  02/19/22 148 lb (67.1 kg)   Physical Exam Vitals reviewed.  Constitutional:      Appearance: Normal appearance.  Cardiovascular:     Rate and Rhythm: Normal rate and regular rhythm.     Pulses: Normal pulses.     Heart sounds: Normal heart sounds.   Pulmonary:     Effort: Pulmonary effort is normal.     Breath sounds: Normal breath sounds.  Musculoskeletal:     Right lower leg: No edema.     Left lower leg: No edema.  Neurological:     General: No focal deficit present.     Mental Status: She is alert and oriented to person, place, and time.  Psychiatric:        Mood and Affect: Mood normal.        Behavior: Behavior normal.    LABORATORY  DATA:  I have reviewed the labs as listed.  CBC Latest Ref Rng & Units 03/05/2022 02/26/2022 02/19/2022  WBC 4.0 - 10.5 K/uL 3.6(L) 5.2 2.8(L)  Hemoglobin 12.0 - 15.0 g/dL 10.2(L) 10.0(L) 8.9(L)  Hematocrit 36.0 - 46.0 % 32.1(L) 31.1(L) 28.0(L)  Platelets 150 - 400 K/uL 165 153 185   CMP Latest Ref Rng & Units 03/05/2022 02/26/2022 02/19/2022  Glucose 70 - 99 mg/dL 157(H) 204(H) 148(H)  BUN 8 - 23 mg/dL 61(H) 21 15  Creatinine 0.44 - 1.00 mg/dL 2.05(H) 1.36(H) 1.35(H)  Sodium 135 - 145 mmol/L 135 137 138  Potassium 3.5 - 5.1 mmol/L 3.7 2.2(LL) 2.0(LL)  Chloride 98 - 111 mmol/L 99 102 102  CO2 22 - 32 mmol/L _0 Calcium 8.9 - 10.3 mg/dL 10.0 9.0 8.6(L)  Total Protein 6.5 - 8.1 g/dL 6.8 6.4(L) 6.2(L)  Total Bilirubin 0.3 - 1.2 mg/dL 0.6 2.1(H) 1.0  Alkaline Phos 38 - 126 U/L 79 77 69  AST 15 - 41 U/L 10(L) 12(L) 12(L)  ALT 0 - 44 U/L _1 DIAGNOSTIC IMAGING:  I have independently reviewed the scans and discussed with the patient. No results found.   ASSESSMENT:  1.  IgA kappa plasma cell myeloma, stage I: -RVD started on 01/09/2018, held since 02/14/2020 due to mandible abscess. -Myeloma labs on 05/14/2020 showed progression with M spike of 0.5 g. -RVD started back on 05/21/2020. -Myeloma labs on 07/10/2020 shows M spike improved to 0.3 g from 0.6 g previously.  Free light chain ratio is 1.74 with kappa light chains 28.4. -Myeloma panel from 08/14/2020 shows M spike 0.4 g.  Kappa light chains are 24.8 and ratio is 2.23.   2.  Osteomyelitis of the right mandible/dental  abscess: -Finished IV ceftriaxone on 04/03/2020.  Finished oral antibiotics. -We will hold Xgeva indefinitely.   PLAN:  1.  IgA kappa plasma cell myeloma, stage I: - She is on Revlimid 20 mg 2 weeks on/1 week off.  She is also on Velcade 3 weeks on/1 week off with dexamethasone. - She had a weight loss of 15 pounds since last visit. - Today her creatinine is elevated at 2.05. - Recommend DC lisinopril/HCTZ.  Her blood pressure is also low with systolic in the 94W. - She will receive 1 L of normal saline over 2 hours. - I will hold her Velcade.  She was also told to hold Revlimid. - She was encouraged to drink plenty of fluids.  RTC 2 weeks with repeat labs.   2.  Severe hypokalemia: - Continue potassium 3 times daily at home.  Potassium today is normal.   3.  Osteomyelitis of the right mandible/dental abscess: - Bisphosphonates held indefinitely due to ONJ.   4.  Hypomagnesemia: - Magnesium is normal today at 2.2.  Continue magnesium 3 times daily.   5.  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 - Hemoglobin is 10.2.  Last ferritin was 356 and percent saturation 41.   Orders placed this encounter:  No orders of the defined types were placed in this encounter.    Derek Jack, MD [facility] [phone]   I, Thana Ates, am acting as a scribe for Dr. Derek Jack.  I, Derek Jack MD, have reviewed the above documentation for accuracy and completeness, and I agree with the above.

## 2022-03-05 NOTE — Patient Instructions (Signed)
[facility]  Discharge Instructions: ?Thank you for choosing [facility] to provide your oncology and hematology care.  ?If you have a lab appointment with the Cancer Center, please come in thru the Main Entrance and check in at the main information desk. ? ? ? ?We strive to give you quality time with your provider. You may need to reschedule your appointment if you arrive late (15 or more minutes).  Arriving late affects you and other patients whose appointments are after yours.  Also, if you miss three or more appointments without notifying the office, you may be dismissed from the clinic at the provider?s discretion.    ?  ?For prescription refill requests, have your pharmacy contact our office and allow 72 hours for refills to be completed.   ? ?  ?To help prevent nausea and vomiting after your treatment, we encourage you to take your nausea medication as directed. ? ?BELOW ARE SYMPTOMS THAT SHOULD BE REPORTED IMMEDIATELY: ?*FEVER GREATER THAN 100.4 F (38 ?C) OR HIGHER ?*CHILLS OR SWEATING ?*NAUSEA AND VOMITING THAT IS NOT CONTROLLED WITH YOUR NAUSEA MEDICATION ?*UNUSUAL SHORTNESS OF BREATH ?*UNUSUAL BRUISING OR BLEEDING ?*URINARY PROBLEMS (pain or burning when urinating, or frequent urination) ?*BOWEL PROBLEMS (unusual diarrhea, constipation, pain near the anus) ?TENDERNESS IN MOUTH AND THROAT WITH OR WITHOUT PRESENCE OF ULCERS (sore throat, sores in mouth, or a toothache) ?UNUSUAL RASH, SWELLING OR PAIN  ?UNUSUAL VAGINAL DISCHARGE OR ITCHING  ? ?Items with * indicate a potential emergency and should be followed up as soon as possible or go to the Emergency Department if any problems should occur. ? ?Please show the CHEMOTHERAPY ALERT CARD or IMMUNOTHERAPY ALERT CARD at check-in to the Emergency Department and triage nurse. ? ?Should you have questions after your visit or need to cancel or reschedule your appointment, please contact [facility] [phone]  and follow  the prompts.  Office hours are 8:00 a.m. to 4:30 p.m. Monday - Friday. Please note that voicemails left after 4:00 p.m. may not be returned until the following business day.  We are closed weekends and major holidays. You have access to a nurse at all times for urgent questions. Please call the main number to the clinic [phone] and follow the prompts. ? ?For any non-urgent questions, you may also contact your provider using MyChart. We now offer e-Visits for anyone 18 and older to request care online for non-urgent symptoms. For details visit [email or web address]. ?  ?Also download the MyChart app! Go to the app store, search "MyChart", open the app, select [facility], and log in with your MyChart username and password. ? ?Due to Covid, a mask is required upon entering the hospital/clinic. If you do not have a mask, one will be given to you upon arrival. For doctor visits, patients may have 1 support person aged 18 or older with them. For treatment visits, patients cannot have anyone with them due to current Covid guidelines and our immunocompromised population.  ?

## 2022-03-14 ENCOUNTER — Other Ambulatory Visit: Payer: Self-pay

## 2022-03-14 ENCOUNTER — Encounter (HOSPITAL_COMMUNITY): Payer: Self-pay

## 2022-03-14 ENCOUNTER — Emergency Department (HOSPITAL_COMMUNITY): Payer: Medicare Other

## 2022-03-14 ENCOUNTER — Emergency Department (HOSPITAL_COMMUNITY)
Admission: EM | Admit: 2022-03-14 | Discharge: 2022-03-14 | Disposition: A | Discharge status: Home / Self Care | Payer: Medicare Other | Attending: Emergency Medicine | Admitting: Emergency Medicine

## 2022-03-14 DIAGNOSIS — Z7984 Long term (current) use of oral hypoglycemic drugs: Secondary | ICD-10-CM | POA: Diagnosis not present

## 2022-03-14 DIAGNOSIS — E119 Type 2 diabetes mellitus without complications: Secondary | ICD-10-CM

## 2022-03-14 DIAGNOSIS — E11628 Type 2 diabetes mellitus with other skin complications: Secondary | ICD-10-CM

## 2022-03-14 DIAGNOSIS — I2510 Atherosclerotic heart disease of native coronary artery without angina pectoris: Secondary | ICD-10-CM | POA: Diagnosis present

## 2022-03-14 DIAGNOSIS — E11649 Type 2 diabetes mellitus with hypoglycemia without coma: Secondary | ICD-10-CM | POA: Diagnosis present

## 2022-03-14 DIAGNOSIS — Z79899 Other long term (current) drug therapy: Secondary | ICD-10-CM | POA: Diagnosis not present

## 2022-03-14 DIAGNOSIS — E1165 Type 2 diabetes mellitus with hyperglycemia: Secondary | ICD-10-CM | POA: Diagnosis not present

## 2022-03-14 DIAGNOSIS — F0390 Unspecified dementia without behavioral disturbance: Secondary | ICD-10-CM | POA: Diagnosis present

## 2022-03-14 DIAGNOSIS — N1832 Chronic kidney disease, stage 3b: Secondary | ICD-10-CM

## 2022-03-14 DIAGNOSIS — E162 Hypoglycemia, unspecified: Secondary | ICD-10-CM | POA: Diagnosis not present

## 2022-03-14 DIAGNOSIS — N189 Chronic kidney disease, unspecified: Secondary | ICD-10-CM | POA: Diagnosis not present

## 2022-03-14 DIAGNOSIS — C9000 Multiple myeloma not having achieved remission: Secondary | ICD-10-CM | POA: Diagnosis present

## 2022-03-14 DIAGNOSIS — I10 Essential (primary) hypertension: Secondary | ICD-10-CM | POA: Diagnosis present

## 2022-03-14 DIAGNOSIS — D539 Nutritional anemia, unspecified: Secondary | ICD-10-CM | POA: Diagnosis present

## 2022-03-14 DIAGNOSIS — R4182 Altered mental status, unspecified: Secondary | ICD-10-CM | POA: Insufficient documentation

## 2022-03-14 DIAGNOSIS — D649 Anemia, unspecified: Secondary | ICD-10-CM | POA: Diagnosis present

## 2022-03-14 DIAGNOSIS — I129 Hypertensive chronic kidney disease with stage 1 through stage 4 chronic kidney disease, or unspecified chronic kidney disease: Secondary | ICD-10-CM | POA: Insufficient documentation

## 2022-03-14 DIAGNOSIS — N390 Urinary tract infection, site not specified: Secondary | ICD-10-CM | POA: Diagnosis present

## 2022-03-14 DIAGNOSIS — N3000 Acute cystitis without hematuria: Secondary | ICD-10-CM | POA: Diagnosis not present

## 2022-03-14 DIAGNOSIS — Z7982 Long term (current) use of aspirin: Secondary | ICD-10-CM | POA: Diagnosis not present

## 2022-03-14 DIAGNOSIS — N183 Chronic kidney disease, stage 3 unspecified: Secondary | ICD-10-CM | POA: Diagnosis present

## 2022-03-14 LAB — CBC WITH DIFFERENTIAL/PLATELET
Abs Immature Granulocytes: 0.02 10*3/uL (ref 0.00–0.07)
Basophils Absolute: 0 10*3/uL (ref 0.0–0.1)
Basophils Relative: 0 %
Eosinophils Absolute: 0 10*3/uL (ref 0.0–0.5)
Eosinophils Relative: 1 %
HCT: 27.8 % — ABNORMAL LOW (ref 36.0–46.0)
Hemoglobin: 8.7 g/dL — ABNORMAL LOW (ref 12.0–15.0)
Immature Granulocytes: 0 %
Lymphocytes Relative: 13 %
Lymphs Abs: 0.7 10*3/uL (ref 0.7–4.0)
MCH: 32.6 pg (ref 26.0–34.0)
MCHC: 31.3 g/dL (ref 30.0–36.0)
MCV: 104.1 fL — ABNORMAL HIGH (ref 80.0–100.0)
Monocytes Absolute: 0.5 10*3/uL (ref 0.1–1.0)
Monocytes Relative: 8 %
Neutro Abs: 4.2 10*3/uL (ref 1.7–7.7)
Neutrophils Relative %: 78 %
Platelets: 303 10*3/uL (ref 150–400)
RBC: 2.67 MIL/uL — ABNORMAL LOW (ref 3.87–5.11)
RDW: 16 % — ABNORMAL HIGH (ref 11.5–15.5)
WBC: 5.5 10*3/uL (ref 4.0–10.5)
nRBC: 0 % (ref 0.0–0.2)

## 2022-03-14 LAB — COMPREHENSIVE METABOLIC PANEL
ALT: 8 U/L (ref 0–44)
AST: 13 U/L — ABNORMAL LOW (ref 15–41)
Albumin: 3 g/dL — ABNORMAL LOW (ref 3.5–5.0)
Alkaline Phosphatase: 68 U/L (ref 38–126)
Anion gap: 8 (ref 5–15)
BUN: 29 mg/dL — ABNORMAL HIGH (ref 8–23)
CO2: 23 mmol/L (ref 22–32)
Calcium: 9.5 mg/dL (ref 8.9–10.3)
Chloride: 103 mmol/L (ref 98–111)
Creatinine, Ser: 1.82 mg/dL — ABNORMAL HIGH (ref 0.44–1.00)
GFR, Estimated: 28 mL/min — ABNORMAL LOW (ref >60)
Glucose, Bld: 64 mg/dL — ABNORMAL LOW (ref 70–99)
Potassium: 3.9 mmol/L (ref 3.5–5.1)
Sodium: 134 mmol/L — ABNORMAL LOW (ref 135–145)
Total Bilirubin: 0.7 mg/dL (ref 0.3–1.2)
Total Protein: 7.3 g/dL (ref 6.5–8.1)

## 2022-03-14 LAB — HEMOGLOBIN A1C
Hgb A1c MFr Bld: 5.8 % — ABNORMAL HIGH (ref 4.8–5.6)
Mean Plasma Glucose: 119.76 mg/dL

## 2022-03-14 LAB — URINALYSIS, ROUTINE W REFLEX MICROSCOPIC
Bilirubin Urine: NEGATIVE
Glucose, UA: NEGATIVE mg/dL
Ketones, ur: NEGATIVE mg/dL
Nitrite: NEGATIVE
Protein, ur: 30 mg/dL — AB
Specific Gravity, Urine: 1.009 (ref 1.005–1.030)
WBC, UA: >50 WBC/hpf — ABNORMAL HIGH (ref 0–5)
pH: 7 (ref 5.0–8.0)

## 2022-03-14 LAB — BASIC METABOLIC PANEL
Anion gap: 10 (ref 5–15)
BUN: 27 mg/dL — ABNORMAL HIGH (ref 8–23)
CO2: 22 mmol/L (ref 22–32)
Calcium: 9.1 mg/dL (ref 8.9–10.3)
Chloride: 100 mmol/L (ref 98–111)
Creatinine, Ser: 1.78 mg/dL — ABNORMAL HIGH (ref 0.44–1.00)
GFR, Estimated: 28 mL/min — ABNORMAL LOW (ref >60)
Glucose, Bld: 227 mg/dL — ABNORMAL HIGH (ref 70–99)
Potassium: 3.6 mmol/L (ref 3.5–5.1)
Sodium: 132 mmol/L — ABNORMAL LOW (ref 135–145)

## 2022-03-14 LAB — LACTIC ACID, PLASMA
Lactic Acid, Venous: 2 mmol/L (ref 0.5–1.9)
Lactic Acid, Venous: 2 mmol/L (ref 0.5–1.9)
Lactic Acid, Venous: 2.3 mmol/L (ref 0.5–1.9)

## 2022-03-14 LAB — TROPONIN I (HIGH SENSITIVITY)
Troponin I (High Sensitivity): 12 ng/L (ref <18)
Troponin I (High Sensitivity): 11 ng/L (ref <18)

## 2022-03-14 LAB — CBG MONITORING, ED
Glucose-Capillary: 101 mg/dL — ABNORMAL HIGH (ref 70–99)
Glucose-Capillary: 52 mg/dL — ABNORMAL LOW (ref 70–99)
Glucose-Capillary: 153 mg/dL — ABNORMAL HIGH (ref 70–99)
Glucose-Capillary: 178 mg/dL — ABNORMAL HIGH (ref 70–99)

## 2022-03-14 LAB — PROTIME-INR
INR: 1 (ref 0.8–1.2)
Prothrombin Time: 12.9 seconds (ref 11.4–15.2)

## 2022-03-14 MED ORDER — ASPIRIN 81 MG PO TABS: 81.0000 mg | ORAL_TABLET | Freq: Every day | ORAL | 5 refills | Status: DC

## 2022-03-14 MED ORDER — CEFTRIAXONE SODIUM 2 G IJ SOLR
2.0000 g | Freq: Once | INTRAMUSCULAR | Status: AC
  Administered 2022-03-14: 2 g via INTRAVENOUS
  Filled 2022-03-14: qty 20

## 2022-03-14 MED ORDER — DEXTROSE 10 % IV SOLN: Freq: Once | INTRAVENOUS | Status: AC

## 2022-03-14 MED ORDER — LACTATED RINGERS IV BOLUS (SEPSIS): 500.0000 mL | Freq: Once | INTRAVENOUS | Status: DC

## 2022-03-14 MED ORDER — DEXTROSE 10 % IV BOLUS
250.0000 mL | Freq: Once | INTRAVENOUS | Status: AC
  Administered 2022-03-14: 250 mL via INTRAVENOUS

## 2022-03-14 MED ORDER — MIRTAZAPINE 15 MG PO TABS: 7.5000 mg | ORAL_TABLET | Freq: Every day | ORAL | 2 refills | Status: DC

## 2022-03-14 MED ORDER — CEPHALEXIN 250 MG PO CAPS
250.0000 mg | ORAL_CAPSULE | Freq: Three times a day (TID) | ORAL | 0 refills | Status: AC
Start: 2022-03-14 — End: 2022-03-19

## 2022-03-14 NOTE — ED Notes (Signed)
Pt ate entire container of strawberry icecream ?

## 2022-03-14 NOTE — ED Notes (Signed)
Pt drank almost all of her ensure. Half of a mango icee anda couple bites of strawberry ice cream. ?

## 2022-03-14 NOTE — ED Notes (Signed)
NT gave pt soda and crackers per md d/t cbg dropping from 300's with ems to 101 in ED. Will follow up with cbg in 15 minutes  ?

## 2022-03-14 NOTE — ED Triage Notes (Signed)
PT arrives via ems from home. Family called ems due to her being unresponsive. Upon EMS arrival, her BG was 49. EMS administered D50 and BG was 349 after. Pt arrives alert and oriented to name, dob, location. Pt was confused about the current year and month. Family states this is her baseline. Pt denies any complaints at this time.  ? ?

## 2022-03-14 NOTE — Discharge Instructions (Signed)
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 ?3)Stop Lisinopril HCTZ/Hydrochlorothiazide--to avoid dehydration and further kidney problems since you are not eating or drinking well ? ?4)Please take Mirtazapine/Remeron at bedtime to help your boost appetite ? ?5)You are strongly advised to eat frequent meals and drink throughout the day to avoid dehydration and low blood sugars ? ?[facility] home health registered nurse and social worker will be sent to your house to help you with your medications to avoid medication errors ? ?7)Repeat CBC and BMP blood work around Thursday, 03/19/2022 with the primary care physician advised ? ?8)Please take Keflex/Cephalexin for UTI/urinary tract infection as prescribed ? ?

## 2022-03-14 NOTE — ED Notes (Signed)
Pt provided with more juice and snacks. Pt remains AxO ? ?

## 2022-03-14 NOTE — ED Provider Notes (Signed)
?[facility] ?Provider Note ? ? ?CSN: [id number] ?Arrival date & time: 03/14/22  1144 ? ?  ? ?History ? ?Chief Complaint  ?Patient presents with  ? Hypoglycemia  ? ? ?Brittany Archer is a 81 y.o. female. ? ? ?Hypoglycemia ?Patient presents for hyperglycemia.  EMS was called this morning due to the patient's altered mental status.  When they arrived, they found blood sugar low in the 40s.  They administered D50 with subsequent normalization of her mental status.  Per chart review, medical history includes multiple myeloma, anemia, CKD, T2DM, CAD, HTN.  She is followed by Dr. Raliegh Ip for her multiple myeloma.  She is currently receiving chemotherapy infusions.  Home medications include glipizide and metformin.  She typically takes her medications in the morning, although she is unsure if she took this morning.  She has not eaten prior to arrival.  Her sister bedside confirms that she is back to her normal mental status.  Patient is able to live independently.  She manages her own medications.  She does have her brother and son at home with her.  She ambulates without a walker or cane.  She states that she has recently been in her normal state of health.  She denies any recent infectious symptoms. ? ?Home Medications ?Prior to Admission medications   ?Medication Sig Start Date End Date Taking? Authorizing Provider  ?cephALEXin (KEFLEX) 250 MG capsule Take 1 capsule (250 mg total) by mouth 3 (three) times daily for 5 days. 03/14/22 03/19/22 Yes Emokpae, Courage, MD  ?mirtazapine (REMERON) 15 MG tablet Take 0.5 tablets (7.5 mg total) by mouth at bedtime. For appetite stimulation 03/14/22  Yes Emokpae, Courage, MD  ?acyclovir (ZOVIRAX) 400 MG tablet TAKE 1 TABLET BY MOUTH TWICE DAILY ?Patient taking differently: Take 400 mg by mouth 2 (two) times daily. 01/31/20   Glennie Isle, NP-C  ?aspirin 81 MG tablet Take 1 tablet (81 mg total) by mouth daily with breakfast. 03/14/22   Roxan Hockey, MD  ?bortezomib  IV (VELCADE) 3.5 MG injection Inject 3.5 mg into the vein once a week. weekly    [provider]  ?chlorhexidine (PERIDEX) 0.12 % solution Use as directed 15 mLs in the mouth or throat 3 (three) times daily. 04/04/20   [provider]  ?dexamethasone (DECADRON) 4 MG tablet Take 10 tablets (40 mg) on days 1, 8, and 15 of chemo. Repeat every 21 days. ?Patient taking differently: Take 4 mg by mouth See admin instructions. Take 10 tablets (40 mg) on days 1, 8, and 15 of chemo. Repeat every 21 days. 04/24/19   Derek Jack, MD  ?glucose blood (ACCU-CHEK AVIVA PLUS) test strip CHECK BLOOD SUGAR ONCE DAILY    [provider]  ?lenalidomide (REVLIMID) 20 MG capsule Take 20 mg capsule by mouth once daily for 14 days on, and 7 days off of a 21 day cycle. 02/26/22   Derek Jack, MD  ?magnesium oxide (MAG-OX) 400 (240 Mg) MG tablet Take 1 tablet (400 mg total) by mouth in the morning, at noon, and at bedtime. 01/22/22   Derek Jack, MD  ?Potassium Acetate POWD Take 20 mEq by mouth daily. 12/09/21   Derek Jack, MD  ?   ? ?Allergies    ?Other, Seasonal ic [cholestatin], and Motrin [ibuprofen]   ? ?Review of Systems   ?Review of Systems  ?Neurological:  Positive for syncope.  ?Psychiatric/Behavioral:  Positive for confusion.   ?All other systems reviewed and are negative. ? ?Physical Exam ?  Updated Vital Signs ?BP (!) 134/57   Pulse 80   Temp 98.2 ?F (36.8 ?C) (Oral)   Resp 16   Ht _0  (1.549 m)   Wt 62.1 kg   SpO2 99%   BMI 25.89 kg/m?  ?Physical Exam ?Vitals and nursing note reviewed.  ?Constitutional:   ?   General: She is not in acute distress. ?   Appearance: Normal appearance. She is well-developed and normal weight. She is not ill-appearing, toxic-appearing or diaphoretic.  ?HENT:  ?   Head: Normocephalic and atraumatic.  ?   Right Ear: External ear normal.  ?   Left Ear: External ear normal.  ?   Nose: Nose normal.  ?   Mouth/Throat:  ?   Mouth: Mucous  membranes are moist.  ?   Pharynx: Oropharynx is clear.  ?Eyes:  ?   Extraocular Movements: Extraocular movements intact.  ?   Conjunctiva/sclera: Conjunctivae normal.  ?Cardiovascular:  ?   Rate and Rhythm: Normal rate and regular rhythm.  ?   Heart sounds: No murmur heard. ?Pulmonary:  ?   Effort: Pulmonary effort is normal. No respiratory distress.  ?   Breath sounds: Normal breath sounds. No wheezing or rales.  ?Chest:  ?   Chest wall: No tenderness.  ?Abdominal:  ?   Palpations: Abdomen is soft.  ?   Tenderness: There is no abdominal tenderness.  ?Musculoskeletal:     ?   General: No swelling. Normal range of motion.  ?   Cervical back: Normal range of motion and neck supple. No rigidity.  ?   Right lower leg: No edema.  ?   Left lower leg: No edema.  ?Skin: ?   General: Skin is warm and dry.  ?   Capillary Refill: Capillary refill takes less than 2 seconds.  ?   Coloration: Skin is not jaundiced or pale.  ?Neurological:  ?   General: No focal deficit present.  ?   Mental Status: She is alert. Mental status is at baseline.  ?   Cranial Nerves: No cranial nerve deficit.  ?   Sensory: No sensory deficit.  ?   Motor: No weakness.  ?   Coordination: Coordination normal.  ?Psychiatric:     ?   Mood and Affect: Mood normal.     ?   Behavior: Behavior normal.     ?   Thought Content: Thought content normal.     ?   Judgment: Judgment normal.  ? ? ?ED Results / Procedures / Treatments   ?Labs ?(all labs ordered are listed, but only abnormal results are displayed) ?Labs Reviewed  ?COMPREHENSIVE METABOLIC PANEL - Abnormal; Notable for the following components:  ?    Result Value  ? Sodium 134 (*)   ? Glucose, Bld 64 (*)   ? BUN 29 (*)   ? Creatinine, Ser 1.82 (*)   ? Albumin 3.0 (*)   ? AST 13 (*)   ? GFR, Estimated 28 (*)   ? All other components within normal limits  ?CBC WITH DIFFERENTIAL/PLATELET - Abnormal; Notable for the following components:  ? RBC 2.67 (*)   ? Hemoglobin 8.7 (*)   ? HCT 27.8 (*)   ? MCV 104.1  (*)   ? RDW 16.0 (*)   ? All other components within normal limits  ?URINALYSIS, ROUTINE W REFLEX MICROSCOPIC - Abnormal; Notable for the following components:  ? APPearance CLOUDY (*)   ? Hgb urine dipstick SMALL (*)   ?  Protein, ur 30 (*)   ? Leukocytes,Ua LARGE (*)   ? WBC, UA >50 (*)   ? Bacteria, UA MANY (*)   ? Non Squamous Epithelial 0-5 (*)   ? All other components within normal limits  ?LACTIC ACID, PLASMA - Abnormal; Notable for the following components:  ? Lactic Acid, Venous 2.0 (*)   ? All other components within normal limits  ?LACTIC ACID, PLASMA - Abnormal; Notable for the following components:  ? Lactic Acid, Venous 2.0 (*)   ? All other components within normal limits  ?BASIC METABOLIC PANEL - Abnormal; Notable for the following components:  ? Sodium 132 (*)   ? Glucose, Bld 227 (*)   ? BUN 27 (*)   ? Creatinine, Ser 1.78 (*)   ? GFR, Estimated 28 (*)   ? All other components within normal limits  ?LACTIC ACID, PLASMA - Abnormal; Notable for the following components:  ? Lactic Acid, Venous 2.3 (*)   ? All other components within normal limits  ?HEMOGLOBIN A1C - Abnormal; Notable for the following components:  ? Hgb A1c MFr Bld 5.8 (*)   ? All other components within normal limits  ?CBG MONITORING, ED - Abnormal; Notable for the following components:  ? Glucose-Capillary 101 (*)   ? All other components within normal limits  ?CBG MONITORING, ED - Abnormal; Notable for the following components:  ? Glucose-Capillary 52 (*)   ? All other components within normal limits  ?CBG MONITORING, ED - Abnormal; Notable for the following components:  ? Glucose-Capillary 153 (*)   ? All other components within normal limits  ?CBG MONITORING, ED - Abnormal; Notable for the following components:  ? Glucose-Capillary 178 (*)   ? All other components within normal limits  ?URINE CULTURE  ?PROTIME-INR  ?TROPONIN I (HIGH SENSITIVITY)  ?TROPONIN I (HIGH SENSITIVITY)  ? ? ?EKG ?EKG Interpretation ? ?Date/Time:  Saturday  March 14 2022 13:33:46 EDT ?Ventricular Rate:  54 ?PR Interval:  158 ?QRS Duration: 87 ?QT Interval:  450 ?QTC Calculation: 427 ?R Axis:   -7 ?Text Interpretation: Sinus rhythm Abnormal R-wave progression, early tran

## 2022-03-14 NOTE — Consult Note (Signed)
?                                                                                           ? ? Patient Demographics:  ? ? ?Brittany Archer, is a 81 y.o. female  MRN: [id number]   DOB - [birth year] ? ?Admit Date - 03/14/2022 ? ?Outpatient Primary MD for the patient is Brittany Richard, MD ? ? Assessment & Plan:  ? ?Assessment and Plan: ?Problem  ?Hypoglycemia  ?Type 2 Diabetes Mellitus (Hcc)  ?CKD (chronic kidney disease), stage III B  ?Uti (Urinary Tract Infection)  ?Dementia Without Behavioral Disturbance (Hcc)  ?Chronic Anemia  ?Coronary Artery Disease  ?Hypertension  ?Multiple Myeloma Not Having Achieved Remission (Hcc)  ? ? ?1)Hypoglycemia--multifactorial etiology including poor oral intake in the setting of dementia with poor appetite and underlying multiple myeloma, possible medication errors with patient probably taking more metformin and glipizide than prescribed due to underlying dementia/confusion episodes ?--Worsening renal function with reduced renal clearance ?-Plan of care discussed with patient and patient's sister [name] at bedside ?-Patient is to Stop Metformin and Glipizide ?-A1c pending ?-Increase oral intake advised--use Remeron for appetite stimulation ?-Liberalize diet ?-Home med RN to help patient medication set up to avoid medication errors, I am worried that patient may inadvertently continue to take metformin and glipizide despite discontinuation ? ?2)AKI----acute kidney injury on CKD stage - 3B ?--BUN 29 creatinine 1.78, please note that BUN was 61 on 03/05/2022 and creatinine was 2.05 on 03/05/2022 ?--bicarb is 23 and potassium is 3.9 ?--Suspect this is related to dehydration in the setting of poor oral intake/anorexia compounded by lisinopril HCTZ use ?- renally adjust medications, avoid nephrotoxic agents / dehydration  / hypotension ?-Stop Lisinopril HCTZ due to dehydration AKI ?-Repeat BMP within a  week ? ?3)History of Breast Cancer and Multiple Myeloma--patient follows with Dr. Delton Archer ? ?4)Mild Dementia with Cognitive Decline-----patient lives at home with her son who is in his 28s, but he has cognitive deficits as well and is unable to help patient ?-oriented x2, obvious memory and cognitive deficits, as per sister [name] this is baseline ?-Visiting home RN and social worker visit requested for home assessment ? ?5)Presumed UTI--- patient received IV Rocephin in the ED okay to discharge on Keflex dose adjusted for renal function ? ?6)Lactic acidosis--- due to dehydration in the setting of AKI and metformin use ?-No frank sepsis ?-Stop metformin and increase hydration advised ? ?7)Chronic Anemia--in the setting of CKD and multiple myeloma ?-Hemoglobin is 8.7, hemoglobin was 8.9 on 02/19/2022 ?-Retacrit--dose apparently was deferred on 03/05/2022 ?-Patient may need ESA/Retacrit sooner rather than later ?-No bleeding concerns repeat CBC within a week advised ?-Follow-up with Dr. Delton Archer ? ?8)Anorexia and Weight Loss--- in the setting of underlying multiple myeloma, dementia and poor oral intake ?-Remeron as prescribed ?-Liberalize diet ? ?9)Social/Ethics---Discussed with sister, if FTT and Anorexia persist sister will consider Hospice Referal ?-Overall Prognosis is not great ? ?Disposition--Home with [facility] services ---RN and Social worker ?--Discharge instructions:- ?-1)You have Diabetes Mellitus Type 2 -you are Not eating well enough so your blood glucose/sugar has been running low late and you  have been losing weight ?-  ?2)Please STOP metformin and glipizide diabetic medications to avoid persistent low blood sugars since you are not eating well ? ?3)Stop Lisinopril HCTZ/Hydrochlorothiazide--to avoid dehydration and further kidney problems since you are not eating or drinking well ? ?4)Please take Mirtazapine/Remeron at bedtime to help your boost appetite ? ?5)You are strongly advised to eat frequent  meals and drink throughout the day to avoid dehydration and low blood sugars ? ?[facility] home health registered nurse and social worker will be sent to your house to help you with your medications to avoid medication errors ? ?7)Repeat CBC and BMP blood work around Thursday, 03/19/2022 with the primary care physician advised ? ?8)Please take Keflex/Cephalexin for UTI/urinary tract infection as prescribed ? ? Multiple Myeloma, CKD 3B, Dementia with risk of medication errors-- ? ?-She is dced from ED---Needs [place] and Social worker to avoid medication errors ? ?Dispo: The patient is from: Home ?             Anticipated d/c is to: Home with [facility] RN and social worker ?      ?With History of - ?Reviewed by me ? ?Past Medical History:  ?Diagnosis Date  ? Breast cancer ([place])   ? left breast/ 2008/ surg/ rad tx  ? Coronary artery disease   ? Diabetes mellitus   ?   ? ?Past Surgical History:  ?Procedure Laterality Date  ? ABDOMINAL HYSTERECTOMY    ? BREAST SURGERY    ? DEBRIDEMENT MANDIBLE N/A 02/22/2020  ? Procedure: INCISION AND DRAINAGE WITH DEBRIDEMENT MANDIBLE;  Surgeon: Michael Litter, DMD;  Location: [facility];  Service: Oral Surgery;  Laterality: N/A;  ? DEBRIDEMENT MANDIBLE Right 03/19/2021  ? Procedure: DEBRIDEMENT OF BONE RIGHT INTERIOR  MANDIBLE;  Surgeon: Michael Litter, DMD;  Location: [facility];  Service: Oral Surgery;  Laterality: Right;  ? EYE SURGERY  2021  ? cataract removals   ? TOOTH EXTRACTION N/A 02/22/2020  ? Procedure: DENTAL RESTORATION/EXTRACTIONS;  Surgeon: Michael Litter, DMD;  Location: [facility];  Service: Oral Surgery;  Laterality: N/A;  DENTAL KIT REQUESTED  ? ? ?Chief Complaint  ?Patient presents with  ? Hypoglycemia  ?  ? ? HPI:  ? ? Brittany Archer  is a 81 y.o. female with past medical history relevant for history of breast cancer, multiple myeloma (she sees Dr. Delton Archer for above), HTN/CAD, CKD 3B, DM2 and dementia with mild cognitive and memory deficits presents to the ED after unresponsive episode EMS found  her blood sugar to be 49 ?- ?Additional history obtained from patient's sister [name] at bedside--- currently patient has had poor appetite with decreased oral intake ?-More than 15 pound weight loss over the last few months, ?-At recent visit with Dr. Delton Archer she was found to have worsening renal function ?-Speaking with patient's sister [name]--apparently patient still does have medications by herself even though she has dementia with cognitive and memory deficits confusional episodes--there is concerned that patient may be making medication errors and may have taken more metformin and glipizide than prescribed-- ?Fortunately-currently patient is not in any insulin\, patient is also not on extended release/Archer-acting glipizide or metformin --- she takes just the short acting formulation of glipizide and metformin ?- ?No fever  Or chills  ? ?No Nausea, Vomiting or Diarrhea, just poor appetite ?-Lactic acid was 2.0 ?-Sodium 134, glucose 64,  ?-BUN 29 creatinine 1.82, please note that BUN was 61 on 03/05/2022 and creatinine was 2.05 on 03/05/2022, bicarb is  23 and potassium is 3.9 ?-LFTs are not elevated ?-Hemoglobin is 8.7 hemoglobin was 8.9 on 02/19/2022 ?-WBC 5.5 ?-Troponin is 12, repeat 11 ?-UA suggestive of UTI ?-Chest x-ray without acute finding ?-EKG sinus ? ? ? Review of systems:  ?  ?In addition to the HPI above,  ? ?A full Review of  Systems was done, all other systems reviewed are negative except as noted above in HPI , . ? ? Social History:  ?Reviewed by me ? ?  ?Social History  ? ?Tobacco Use  ? Smoking status: Never  ? Smokeless tobacco: Never  ?Substance Use Topics  ? Alcohol use: No  ? ? ? Family History :  ?Reviewed by me ?  ?Family History  ?Problem Relation Age of Onset  ? Obesity Sister   ? ? ? Home Medications:  ? ?Prior to Admission medications   ?Medication Sig Start Date End Date Taking? Authorizing Provider  ?cephALEXin (KEFLEX) 250 MG capsule Take 1 capsule (250 mg total) by mouth  3 (three) times daily for 5 days. 03/14/22 03/19/22 Yes Dyonna Jaspers, MD  ?mirtazapine (REMERON) 15 MG tablet Take 0.5 tablets (7.5 mg total) by mouth at bedtime. For appetite stimulation 03/14/22  Yes Candus Braud, Co

## 2022-03-17 LAB — URINE CULTURE: Culture: >=100000 — AB

## 2022-03-18 ENCOUNTER — Telehealth: Payer: Self-pay | Admitting: *Deleted

## 2022-03-18 NOTE — Telephone Encounter (Signed)
Post ED Visit - Positive Culture Follow-up ? ?Culture report reviewed by antimicrobial stewardship pharmacist: ?[facility] ?'[]'$  Elenor Quinones, Pharm.D. ?'[x]'$  Heide Guile, Pharm.D., BCPS AQ-ID ?'[]'$  Parks Neptune, Pharm.D., BCPS ?'[]'$  Alycia Rossetti, Pharm.D., BCPS ?'[]'$  Friedens, Pharm.D., BCPS, AAHIVP ?'[]'$  Legrand Como, Pharm.D., BCPS, AAHIVP ?'[]'$  Salome Arnt, PharmD, BCPS ?'[]'$  Johnnette Gourd, PharmD, BCPS ?'[]'$  Hughes Better, PharmD, BCPS ?'[]'$  Leeroy Cha, PharmD ?'[]'$  Laqueta Linden, PharmD, BCPS ?'[]'$  Albertina Parr, PharmD ? ?[facility] ?'[]'$  Leodis Sias, PharmD ?'[]'$  Lindell Spar, PharmD ?'[]'$  Royetta Asal, PharmD ?'[]'$  Graylin Shiver, Rph ?'[]'$  Rema Fendt) Glennon Mac, PharmD ?'[]'$  Arlyn Dunning, PharmD ?'[]'$  Netta Cedars, PharmD ?'[]'$  Dia Sitter, PharmD ?'[]'$  Leone Haven, PharmD ?'[]'$  Gretta Arab, PharmD ?'[]'$  Theodis Shove, PharmD ?'[]'$  Peggyann Juba, PharmD ?'[]'$  Reuel Boom, PharmD ? ? ?Positive urine culture ?Treated with Cephalexin, organism sensitive to the same and no further patient follow-up is required at this time. ? ?Ardeen Fillers ?03/18/2022, 9:56 AM ?  ?

## 2022-03-23 ENCOUNTER — Inpatient Hospital Stay (HOSPITAL_COMMUNITY): Payer: Medicare Other

## 2022-03-23 ENCOUNTER — Other Ambulatory Visit: Payer: Self-pay

## 2022-03-23 ENCOUNTER — Inpatient Hospital Stay (HOSPITAL_BASED_OUTPATIENT_CLINIC_OR_DEPARTMENT_OTHER): Payer: Medicare Other | Admitting: Hematology

## 2022-03-23 VITALS — BP 119/61 | HR 61 | Temp 97.5°F | Resp 18 | Wt 139.9 lb

## 2022-03-23 DIAGNOSIS — D649 Anemia, unspecified: Secondary | ICD-10-CM | POA: Diagnosis not present

## 2022-03-23 DIAGNOSIS — E538 Deficiency of other specified B group vitamins: Secondary | ICD-10-CM | POA: Diagnosis not present

## 2022-03-23 DIAGNOSIS — C9000 Multiple myeloma not having achieved remission: Secondary | ICD-10-CM | POA: Diagnosis not present

## 2022-03-23 DIAGNOSIS — Z5112 Encounter for antineoplastic immunotherapy: Secondary | ICD-10-CM | POA: Diagnosis not present

## 2022-03-23 LAB — CBC WITH DIFFERENTIAL/PLATELET
Abs Immature Granulocytes: 0.02 10*3/uL (ref 0.00–0.07)
Basophils Absolute: 0.1 10*3/uL (ref 0.0–0.1)
Basophils Relative: 2 %
Eosinophils Absolute: 0.2 10*3/uL (ref 0.0–0.5)
Eosinophils Relative: 5 %
HCT: 26.3 % — ABNORMAL LOW (ref 36.0–46.0)
Hemoglobin: 8.3 g/dL — ABNORMAL LOW (ref 12.0–15.0)
Immature Granulocytes: 1 %
Lymphocytes Relative: 17 %
Lymphs Abs: 0.7 10*3/uL (ref 0.7–4.0)
MCH: 32.9 pg (ref 26.0–34.0)
MCHC: 31.6 g/dL (ref 30.0–36.0)
MCV: 104.4 fL — ABNORMAL HIGH (ref 80.0–100.0)
Monocytes Absolute: 0.4 10*3/uL (ref 0.1–1.0)
Monocytes Relative: 8 %
Neutro Abs: 2.9 10*3/uL (ref 1.7–7.7)
Neutrophils Relative %: 67 %
Platelets: 248 10*3/uL (ref 150–400)
RBC: 2.52 MIL/uL — ABNORMAL LOW (ref 3.87–5.11)
RDW: 16.1 % — ABNORMAL HIGH (ref 11.5–15.5)
WBC: 4.3 10*3/uL (ref 4.0–10.5)
nRBC: 0 % (ref 0.0–0.2)

## 2022-03-23 LAB — COMPREHENSIVE METABOLIC PANEL
ALT: 9 U/L (ref 0–44)
AST: 12 U/L — ABNORMAL LOW (ref 15–41)
Albumin: 3 g/dL — ABNORMAL LOW (ref 3.5–5.0)
Alkaline Phosphatase: 74 U/L (ref 38–126)
Anion gap: 6 (ref 5–15)
BUN: 19 mg/dL (ref 8–23)
CO2: 23 mmol/L (ref 22–32)
Calcium: 9.3 mg/dL (ref 8.9–10.3)
Chloride: 108 mmol/L (ref 98–111)
Creatinine, Ser: 1.5 mg/dL — ABNORMAL HIGH (ref 0.44–1.00)
GFR, Estimated: 35 mL/min — ABNORMAL LOW (ref >60)
Glucose, Bld: 116 mg/dL — ABNORMAL HIGH (ref 70–99)
Potassium: 4.9 mmol/L (ref 3.5–5.1)
Sodium: 137 mmol/L (ref 135–145)
Total Bilirubin: 0.3 mg/dL (ref 0.3–1.2)
Total Protein: 6.4 g/dL — ABNORMAL LOW (ref 6.5–8.1)

## 2022-03-23 LAB — MAGNESIUM: Magnesium: 1.8 mg/dL (ref 1.7–2.4)

## 2022-03-23 MED ORDER — EPOETIN ALFA-EPBX 20000 UNIT/ML IJ SOLN
20000.0000 [IU] | Freq: Once | INTRAMUSCULAR | Status: AC
  Administered 2022-03-23: 20000 [IU] via SUBCUTANEOUS
  Filled 2022-03-23: qty 1

## 2022-03-23 MED ORDER — CYANOCOBALAMIN 1000 MCG/ML IJ SOLN
1000.0000 ug | Freq: Once | INTRAMUSCULAR | Status: AC
  Administered 2022-03-23: 1000 ug via INTRAMUSCULAR
  Filled 2022-03-23: qty 1

## 2022-03-23 NOTE — Progress Notes (Signed)
Labs reviewed with MD today. Will hold velcade per MD.  ? ?Retacrit and B12 injections only given today per orders. Patient tolerated it well without problems. Vitals stable and discharged home from clinic ambulatory. Follow up as scheduled. ? ?

## 2022-03-23 NOTE — Patient Instructions (Addendum)
[place] at [facility] ?Discharge Instructions ? ? ?You were seen and examined today by Dr. Delton Coombes. ? ?He reviewed the results of your lab work which is normal/stable.  ? ?Continue to HOLD Revlimid (cancer pill) and lisinopril/HCTZ.  ? ?We will hold your Velcade today, and give you a Retacrit injection to help your hemoglobin.   ? ?We will check lab work in 2 weeks and see you back in 3 weeks. ? ? ? ? ? ?Thank you for choosing [place] at [facility] to provide your oncology and hematology care.  To afford each patient quality time with our provider, please arrive at least 15 minutes before your scheduled appointment time.  ? ?If you have a lab appointment with the [facility] please come in thru the Main Entrance and check in at the main information desk. ? ?You need to re-schedule your appointment should you arrive 10 or more minutes late.  We strive to give you quality time with our providers, and arriving late affects you and other patients whose appointments are after yours.  Also, if you no show three or more times for appointments you may be dismissed from the clinic at the providers discretion.     ?Again, thank you for choosing [facility].  Our hope is that these requests will decrease the amount of time that you wait before being seen by our physicians.       ?_____________________________________________________________ ? ?Should you have questions after your visit to [facility], please contact our office at [phone] and follow the prompts.  Our office hours are 8:00 a.m. and 4:30 p.m. Monday - Friday.  Please note that voicemails left after 4:00 p.m. may not be returned until the following business day.  We are closed weekends and major holidays.  You do have access to a nurse 24-7, just call the main number to the clinic [phone] and do not press any options, hold on the line and a nurse will answer the phone.    ? ?For prescription refill requests, have your pharmacy contact our office and allow 72 hours.   ? ?Due to Covid, you will need to wear a mask upon entering the hospital. If you do not have a mask, a mask will be given to you at the Main Entrance upon arrival. For doctor visits, patients may have 1 support person age 64 or older with them. For treatment visits, patients can not have anyone with them due to social distancing guidelines and our immunocompromised population.  ? ?   ?

## 2022-03-23 NOTE — Patient Instructions (Signed)
[facility]  Discharge Instructions: ?Thank you for choosing [facility] to provide your oncology and hematology care.  ?If you have a lab appointment with the [facility], please come in thru the Main Entrance and check in at the main information desk. ? line.  ? ?We strive to give you quality time with your provider. You may need to reschedule your appointment if you arrive late (15 or more minutes).  Arriving late affects you and other patients whose appointments are after yours.  Also, if you miss three or more appointments without notifying the office, you may be dismissed from the clinic at the provider?s discretion.    ?  ?For prescription refill requests, have your pharmacy contact our office and allow 72 hours for refills to be completed.   ? ? ?  ?To help prevent nausea and vomiting after your treatment, we encourage you to take your nausea medication as directed. ? ?BELOW ARE SYMPTOMS THAT SHOULD BE REPORTED IMMEDIATELY: ?*FEVER GREATER THAN 100.4 F (38 ?C) OR HIGHER ?*CHILLS OR SWEATING ?*NAUSEA AND VOMITING THAT IS NOT CONTROLLED WITH YOUR NAUSEA MEDICATION ?*UNUSUAL SHORTNESS OF BREATH ?*UNUSUAL BRUISING OR BLEEDING ?*URINARY PROBLEMS (pain or burning when urinating, or frequent urination) ?*BOWEL PROBLEMS (unusual diarrhea, constipation, pain near the anus) ?TENDERNESS IN MOUTH AND THROAT WITH OR WITHOUT PRESENCE OF ULCERS (sore throat, sores in mouth, or a toothache) ?UNUSUAL RASH, SWELLING OR PAIN  ?UNUSUAL VAGINAL DISCHARGE OR ITCHING  ? ?Items with * indicate a potential emergency and should be followed up as soon as possible or go to the Emergency Department if any problems should occur. ? ?Please show the CHEMOTHERAPY ALERT CARD or IMMUNOTHERAPY ALERT CARD at check-in to the Emergency Department and triage nurse. ? ?Should you have questions after your visit or need to cancel or reschedule your appointment, please contact [facility] [phone]  and  follow the prompts.  Office hours are 8:00 a.m. to 4:30 p.m. Monday - Friday. Please note that voicemails left after 4:00 p.m. may not be returned until the following business day.  We are closed weekends and major holidays. You have access to a nurse at all times for urgent questions. Please call the main number to the clinic [phone] and follow the prompts. ? ?For any non-urgent questions, you may also contact your provider using MyChart. We now offer e-Visits for anyone 65 and older to request care online for non-urgent symptoms. For details visit [email or web address]. ?  ?Also download the MyChart app! Go to the app store, search "MyChart", open the app, select [facility], and log in with your MyChart username and password. ? ?Due to Covid, a mask is required upon entering the hospital/clinic. If you do not have a mask, one will be given to you upon arrival. For doctor visits, patients may have 1 support person aged 74 or older with them. For treatment visits, patients cannot have anyone with them due to current Covid guidelines and our immunocompromised population.  ?

## 2022-03-23 NOTE — Progress Notes (Signed)
? ?[name] ?[address] ?[place], [place] [address] ? ? ?CLINIC:  ?Medical Oncology/Hematology ? ?PCP:  ?[name], MD ?[address] [place] [address] ?[phone] ? ? ?REASON FOR VISIT:  ?Follow-up for multiple myeloma ? ?PRIOR THERAPY: none ? ?NGS Results: not done ? ?CURRENT THERAPY: Velcade 3/4 weeks; Revlimid 20 mg 2/3 weeks ? ?BRIEF ONCOLOGIC HISTORY:  ?Oncology History  ?Multiple myeloma not having achieved remission ([place])  ?01/20/2018 Initial Diagnosis  ? Multiple myeloma not having achieved remission ([place]) ?  ?01/26/2018 -  Chemotherapy  ? Patient is on Treatment Plan : MYELOMA  RVD SQ (Bortezomib d 1,8,15 ) q28d x 4 cycles  ?   ? ? ?CANCER STAGING: ? Cancer Staging  ?No matching staging information was found for the patient. ? ?INTERVAL HISTORY:  ?Brittany Archer, a 81 y.o. female, returns for routine follow-up and consideration for next cycle of chemotherapy. Caelen was last seen on 03/05/2022. ? ?Due for day #15 cycle #52 of Velcade today.  ? ?Overall, she tells me she has been feeling pretty well, and she is accompanied by her niece. She has gained 2 lbs since her last visit. She reports she is eating and cooking for herself, but her niece reports she is not eating like she typically would. She is continuing to hold Revlimid.  ? ?Overall, she will not receive her next cycle of chemo today.  ? ?REVIEW OF SYSTEMS:  ?Review of Systems  ?Constitutional:  Negative for appetite change, fatigue and unexpected weight change.  ?All other systems reviewed and are negative. ? ?PAST MEDICAL/SURGICAL HISTORY:  ?Past Medical History:  ?Diagnosis Date  ? Breast cancer ([place])   ? left breast/ 2008/ surg/ rad tx  ? Coronary artery disease   ? Diabetes mellitus   ? ?Past Surgical History:  ?Procedure Laterality Date  ? ABDOMINAL HYSTERECTOMY    ? BREAST SURGERY    ? DEBRIDEMENT MANDIBLE N/A 02/22/2020  ? Procedure: INCISION AND DRAINAGE WITH DEBRIDEMENT MANDIBLE;  Surgeon: Michael Litter, DMD;  Location: [facility];  Service: Oral Surgery;  Laterality: N/A;  ? DEBRIDEMENT MANDIBLE Right 03/19/2021  ? Procedure: DEBRIDEMENT OF BONE RIGHT INTERIOR  MANDIBLE;  Surgeon: Michael Litter, DMD;  Location: [facility];  Service: Oral Surgery;  Laterality: Right;  ? EYE SURGERY  2021  ? cataract removals   ? TOOTH EXTRACTION N/A 02/22/2020  ? Procedure: DENTAL RESTORATION/EXTRACTIONS;  Surgeon: Michael Litter, DMD;  Location: [facility];  Service: Oral Surgery;  Laterality: N/A;  DENTAL KIT REQUESTED  ? ? ?SOCIAL HISTORY:  ?Social History  ? ?Socioeconomic History  ? Marital status: Divorced  ?  Spouse name: Not on file  ? Number of children: Not on file  ? Years of education: Not on file  ? Highest education level: Not on file  ?Occupational History  ? Not on file  ?Tobacco Use  ? Smoking status: Never  ? Smokeless tobacco: Never  ?Vaping Use  ? Vaping Use: Never used  ?Substance and Sexual Activity  ? Alcohol use: No  ? Drug use: No  ? Sexual activity: Yes  ?  Birth control/protection: Surgical  ?Other Topics Concern  ? Not on file  ?Social History Narrative  ? Not on file  ? ?Social Determinants of Health  ? ?Financial Resource Strain: Not on file  ?Food Insecurity: Not on file  ?Transportation Needs: Not on file  ?Physical Activity: Not on file  ?Stress: Not on file  ?Social  Connections: Not on file  ?Intimate Partner Violence: Not on file  ? ? ?FAMILY HISTORY:  ?Family History  ?Problem Relation Age of Onset  ? Obesity Sister   ? ? ?CURRENT MEDICATIONS:  ?Current Outpatient Medications  ?Medication Sig Dispense Refill  ? acyclovir (ZOVIRAX) 400 MG tablet TAKE 1 TABLET BY MOUTH TWICE DAILY (Patient taking differently: Take 400 mg by mouth 2 (two) times daily.) 60 tablet 11  ? aspirin 81 MG tablet Take 1 tablet (81 mg total) by mouth daily with breakfast. 30 tablet 5  ? bortezomib IV (VELCADE) 3.5 MG injection Inject 3.5 mg into the vein once a week. weekly    ? chlorhexidine (PERIDEX) 0.12 % solution Use as directed  15 mLs in the mouth or throat 3 (three) times daily.    ? dexamethasone (DECADRON) 4 MG tablet Take 10 tablets (40 mg) on days 1, 8, and 15 of chemo. Repeat every 21 days. (Patient taking differently: Take 4 mg by mouth See admin instructions. Take 10 tablets (40 mg) on days 1, 8, and 15 of chemo. Repeat every 21 days.) 30 tablet 3  ? glucose blood (ACCU-CHEK AVIVA PLUS) test strip CHECK BLOOD SUGAR ONCE DAILY    ? lenalidomide (REVLIMID) 20 MG capsule Take 20 mg capsule by mouth once daily for 14 days on, and 7 days off of a 21 day cycle. 14 capsule 0  ? magnesium oxide (MAG-OX) 400 (240 Mg) MG tablet Take 1 tablet (400 mg total) by mouth in the morning, at noon, and at bedtime. 90 tablet 6  ? mirtazapine (REMERON) 15 MG tablet Take 0.5 tablets (7.5 mg total) by mouth at bedtime. For appetite stimulation 30 tablet 2  ? Potassium Acetate POWD Take 20 mEq by mouth daily. 12000 g 5  ? ?No current facility-administered medications for this visit.  ? ? ?ALLERGIES:  ?Allergies  ?Allergen Reactions  ? Other Other (See Comments)  ? Seasonal Ic [Cholestatin] Other (See Comments)  ?  Sneezing, watery eyes  ? Motrin [Ibuprofen] Rash  ? ? ?PHYSICAL EXAM:  ?Performance status (ECOG): 1 - Symptomatic but completely ambulatory ? ?There were no vitals filed for this visit. ?Wt Readings from Last 3 Encounters:  ?03/14/22 137 lb (62.1 kg)  ?03/05/22 137 lb 9.1 oz (62.4 kg)  ?02/26/22 145 lb (65.8 kg)  ? ?Physical Exam ?Vitals reviewed.  ?Constitutional:   ?   Appearance: Normal appearance.  ?Cardiovascular:  ?   Rate and Rhythm: Normal rate and regular rhythm.  ?   Pulses: Normal pulses.  ?   Heart sounds: Normal heart sounds.  ?Pulmonary:  ?   Effort: Pulmonary effort is normal.  ?   Breath sounds: Normal breath sounds.  ?Neurological:  ?   General: No focal deficit present.  ?   Mental Status: She is alert and oriented to person, place, and time.  ?Psychiatric:     ?   Mood and Affect: Mood normal.     ?   Behavior: Behavior  normal.  ? ? ?LABORATORY DATA:  ?I have reviewed the labs as listed.  ? ?  Latest Ref Rng & Units 03/14/2022  ?  1:19 PM 03/05/2022  ?  9:32 AM 02/26/2022  ?  9:18 AM  ?CBC  ?WBC 4.0 - 10.5 K/uL 5.5   3.6   5.2    ?Hemoglobin 12.0 - 15.0 g/dL 8.7   10.2   10.0    ?Hematocrit 36.0 - 46.0 % 27.8   32.1  31.1    ?Platelets 150 - 400 K/uL 303   165   153    ? ? ?  Latest Ref Rng & Units 03/14/2022  ?  5:53 PM 03/14/2022  ?  1:19 PM 03/05/2022  ?  9:32 AM  ?CMP  ?Glucose 70 - 99 mg/dL 227   64   157    ?BUN 8 - 23 mg/dL 27   29   61    ?Creatinine 0.44 - 1.00 mg/dL 1.78   1.82   2.05    ?Sodium 135 - 145 mmol/L 132   134   135    ?Potassium 3.5 - 5.1 mmol/L 3.6   3.9   3.7    ?Chloride 98 - 111 mmol/L 100   103   99    ?CO2 22 - 32 mmol/L _0 ?Calcium 8.9 - 10.3 mg/dL 9.1   9.5   10.0    ?Total Protein 6.5 - 8.1 g/dL  7.3   6.8    ?Total Bilirubin 0.3 - 1.2 mg/dL  0.7   0.6    ?Alkaline Phos 38 - 126 U/L  68   79    ?AST 15 - 41 U/L  13   10    ?ALT 0 - 44 U/L  8   9    ? ? ?DIAGNOSTIC IMAGING:  ?I have independently reviewed the scans and discussed with the patient. ?DG Chest Port 1 View ? ?Result Date: 03/14/2022 ?CLINICAL DATA:  Possible sepsis. EXAM: PORTABLE CHEST 1 VIEW COMPARISON:  03/25/2020 FINDINGS: 1303 hours. The lungs are clear without focal pneumonia, edema, pneumothorax or pleural effusion. Cardiopericardial silhouette is at upper limits of normal for size. The visualized bony structures of the thorax are unremarkable. Telemetry leads overlie the chest. IMPRESSION: No active disease. Electronically Signed   By: Misty Stanley M.D.   On: 03/14/2022 13:10    ? ?ASSESSMENT:  ?1.  IgA kappa plasma cell myeloma, stage I: ?-RVD started on 01/09/2018, held since 02/14/2020 due to mandible abscess. ?-Myeloma labs on 05/14/2020 showed progression with M spike of 0.5 g. ?-RVD started back on 05/21/2020. ?-Myeloma labs on 07/10/2020 shows M spike improved to 0.3 g from 0.6 g previously.  Free light chain ratio is 1.74  with kappa light chains 28.4. ?-Myeloma panel from 08/14/2020 shows M spike 0.4 g.  Kappa light chains are 24.8 and ratio is 2.23. ?  ?2.  Osteomyelitis of the right mandible/dental abscess: ?-Finished IV ceftria

## 2022-04-02 ENCOUNTER — Other Ambulatory Visit (HOSPITAL_COMMUNITY): Payer: Self-pay

## 2022-04-02 DIAGNOSIS — C9000 Multiple myeloma not having achieved remission: Secondary | ICD-10-CM

## 2022-04-02 MED ORDER — LENALIDOMIDE 20 MG PO CAPS: ORAL_CAPSULE | ORAL | 0 refills | Status: DC

## 2022-04-02 NOTE — Telephone Encounter (Signed)
Chart reviewed. Revlimid refilled per last office note with Dr. Katragadda.  

## 2022-04-06 ENCOUNTER — Inpatient Hospital Stay (HOSPITAL_COMMUNITY): Payer: Medicare Other | Attending: Hematology

## 2022-04-06 DIAGNOSIS — Z5112 Encounter for antineoplastic immunotherapy: Secondary | ICD-10-CM | POA: Diagnosis present

## 2022-04-06 DIAGNOSIS — N1832 Chronic kidney disease, stage 3b: Secondary | ICD-10-CM | POA: Insufficient documentation

## 2022-04-06 DIAGNOSIS — C9000 Multiple myeloma not having achieved remission: Secondary | ICD-10-CM | POA: Insufficient documentation

## 2022-04-06 DIAGNOSIS — D631 Anemia in chronic kidney disease: Secondary | ICD-10-CM | POA: Diagnosis not present

## 2022-04-06 LAB — COMPREHENSIVE METABOLIC PANEL
ALT: 6 U/L (ref 0–44)
AST: 12 U/L — ABNORMAL LOW (ref 15–41)
Albumin: 3.1 g/dL — ABNORMAL LOW (ref 3.5–5.0)
Alkaline Phosphatase: 85 U/L (ref 38–126)
Anion gap: 9 (ref 5–15)
BUN: 11 mg/dL (ref 8–23)
CO2: 19 mmol/L — ABNORMAL LOW (ref 22–32)
Calcium: 8.8 mg/dL — ABNORMAL LOW (ref 8.9–10.3)
Chloride: 115 mmol/L — ABNORMAL HIGH (ref 98–111)
Creatinine, Ser: 0.98 mg/dL (ref 0.44–1.00)
GFR, Estimated: 58 mL/min — ABNORMAL LOW (ref >60)
Glucose, Bld: 109 mg/dL — ABNORMAL HIGH (ref 70–99)
Potassium: 2.9 mmol/L — ABNORMAL LOW (ref 3.5–5.1)
Sodium: 143 mmol/L (ref 135–145)
Total Bilirubin: 0.4 mg/dL (ref 0.3–1.2)
Total Protein: 6.2 g/dL — ABNORMAL LOW (ref 6.5–8.1)

## 2022-04-06 LAB — CBC WITH DIFFERENTIAL/PLATELET
Abs Immature Granulocytes: 0.01 10*3/uL (ref 0.00–0.07)
Basophils Absolute: 0 10*3/uL (ref 0.0–0.1)
Basophils Relative: 1 %
Eosinophils Absolute: 0.1 10*3/uL (ref 0.0–0.5)
Eosinophils Relative: 3 %
HCT: 25.6 % — ABNORMAL LOW (ref 36.0–46.0)
Hemoglobin: 8.2 g/dL — ABNORMAL LOW (ref 12.0–15.0)
Immature Granulocytes: 0 %
Lymphocytes Relative: 26 %
Lymphs Abs: 1.1 10*3/uL (ref 0.7–4.0)
MCH: 33.3 pg (ref 26.0–34.0)
MCHC: 32 g/dL (ref 30.0–36.0)
MCV: 104.1 fL — ABNORMAL HIGH (ref 80.0–100.0)
Monocytes Absolute: 0.3 10*3/uL (ref 0.1–1.0)
Monocytes Relative: 7 %
Neutro Abs: 2.5 10*3/uL (ref 1.7–7.7)
Neutrophils Relative %: 63 %
Platelets: 222 10*3/uL (ref 150–400)
RBC: 2.46 MIL/uL — ABNORMAL LOW (ref 3.87–5.11)
RDW: 18.3 % — ABNORMAL HIGH (ref 11.5–15.5)
WBC: 4 10*3/uL (ref 4.0–10.5)
nRBC: 0 % (ref 0.0–0.2)

## 2022-04-06 LAB — IRON AND TIBC
Iron: 49 ug/dL (ref 28–170)
Saturation Ratios: 49 % — ABNORMAL HIGH (ref 10.4–31.8)
TIBC: 100 ug/dL — ABNORMAL LOW (ref 250–450)
UIBC: 51 ug/dL

## 2022-04-06 LAB — FERRITIN: Ferritin: 252 ng/mL (ref 11–307)

## 2022-04-06 LAB — MAGNESIUM: Magnesium: 1.6 mg/dL — ABNORMAL LOW (ref 1.7–2.4)

## 2022-04-06 LAB — LACTATE DEHYDROGENASE: LDH: 147 U/L (ref 98–192)

## 2022-04-07 LAB — PROTEIN ELECTROPHORESIS, SERUM
A/G Ratio: 0.9 (ref 0.7–1.7)
Albumin ELP: 2.8 g/dL — ABNORMAL LOW (ref 2.9–4.4)
Alpha-1-Globulin: 0.3 g/dL (ref 0.0–0.4)
Alpha-2-Globulin: 0.9 g/dL (ref 0.4–1.0)
Beta Globulin: 0.9 g/dL (ref 0.7–1.3)
Gamma Globulin: 1 g/dL (ref 0.4–1.8)
Globulin, Total: 3 g/dL (ref 2.2–3.9)
Total Protein ELP: 5.8 g/dL — ABNORMAL LOW (ref 6.0–8.5)

## 2022-04-07 LAB — KAPPA/LAMBDA LIGHT CHAINS
Kappa free light chain: 46.8 mg/L — ABNORMAL HIGH (ref 3.3–19.4)
Kappa, lambda light chain ratio: 2.21 — ABNORMAL HIGH (ref 0.26–1.65)
Lambda free light chains: 21.2 mg/L (ref 5.7–26.3)

## 2022-04-13 ENCOUNTER — Inpatient Hospital Stay (HOSPITAL_COMMUNITY): Payer: Medicare Other

## 2022-04-13 ENCOUNTER — Inpatient Hospital Stay (HOSPITAL_BASED_OUTPATIENT_CLINIC_OR_DEPARTMENT_OTHER): Payer: Medicare Other | Admitting: Hematology

## 2022-04-13 VITALS — BP 156/65 | HR 51 | Temp 96.6°F | Resp 20 | Ht 62.68 in | Wt 147.3 lb

## 2022-04-13 DIAGNOSIS — E538 Deficiency of other specified B group vitamins: Secondary | ICD-10-CM

## 2022-04-13 DIAGNOSIS — C9000 Multiple myeloma not having achieved remission: Secondary | ICD-10-CM

## 2022-04-13 DIAGNOSIS — E876 Hypokalemia: Secondary | ICD-10-CM

## 2022-04-13 DIAGNOSIS — D649 Anemia, unspecified: Secondary | ICD-10-CM | POA: Diagnosis not present

## 2022-04-13 DIAGNOSIS — Z5112 Encounter for antineoplastic immunotherapy: Secondary | ICD-10-CM | POA: Diagnosis not present

## 2022-04-13 MED ORDER — POTASSIUM CHLORIDE CRYS ER 20 MEQ PO TBCR
40.0000 meq | EXTENDED_RELEASE_TABLET | Freq: Once | ORAL | Status: AC
  Administered 2022-04-13: 40 meq via ORAL
  Filled 2022-04-13: qty 2

## 2022-04-13 MED ORDER — PROCHLORPERAZINE MALEATE 10 MG PO TABS
10.0000 mg | ORAL_TABLET | Freq: Once | ORAL | Status: AC
  Administered 2022-04-13: 10 mg via ORAL
  Filled 2022-04-13: qty 1

## 2022-04-13 MED ORDER — DEXAMETHASONE 4 MG PO TABS
20.0000 mg | ORAL_TABLET | Freq: Once | ORAL | Status: AC
  Administered 2022-04-13: 20 mg via ORAL
  Filled 2022-04-13: qty 5

## 2022-04-13 MED ORDER — MAGNESIUM OXIDE -MG SUPPLEMENT 400 (240 MG) MG PO TABS
400.0000 mg | ORAL_TABLET | Freq: Once | ORAL | Status: AC
  Administered 2022-04-13: 400 mg via ORAL
  Filled 2022-04-13: qty 1

## 2022-04-13 MED ORDER — EPOETIN ALFA-EPBX 20000 UNIT/ML IJ SOLN
20000.0000 [IU] | Freq: Once | INTRAMUSCULAR | Status: AC
  Administered 2022-04-13: 20000 [IU] via SUBCUTANEOUS
  Filled 2022-04-13: qty 1

## 2022-04-13 MED ORDER — BORTEZOMIB CHEMO SQ INJECTION 3.5 MG (2.5MG/ML)
1.3000 mg/m2 | Freq: Once | INTRAMUSCULAR | Status: AC
  Administered 2022-04-13: 2.25 mg via SUBCUTANEOUS
  Filled 2022-04-13: qty 0.9

## 2022-04-13 NOTE — Patient Instructions (Signed)
[facility]  Discharge Instructions: ?Thank you for choosing [facility] to provide your oncology and hematology care.  ?If you have a lab appointment with the [facility], please come in thru the Main Entrance and check in at the main information desk. ? ?Wear comfortable clothing and clothing appropriate for easy access to any Portacath or PICC line.  ? ?We strive to give you quality time with your provider. You may need to reschedule your appointment if you arrive late (15 or more minutes).  Arriving late affects you and other patients whose appointments are after yours.  Also, if you miss three or more appointments without notifying the office, you may be dismissed from the clinic at the provider?s discretion.    ?  ?For prescription refill requests, have your pharmacy contact our office and allow 72 hours for refills to be completed.   ? ?Today you received the following chemotherapy and/or immunotherapy agents Velcade, Retacrit, 40 mEq of Potassium Chloride and 400 mg of Magnesium oxide. Return as scheduled. ?  ?To help prevent nausea and vomiting after your treatment, we encourage you to take your nausea medication as directed. ? ?BELOW ARE SYMPTOMS THAT SHOULD BE REPORTED IMMEDIATELY: ?*FEVER GREATER THAN 100.4 F (38 ?C) OR HIGHER ?*CHILLS OR SWEATING ?*NAUSEA AND VOMITING THAT IS NOT CONTROLLED WITH YOUR NAUSEA MEDICATION ?*UNUSUAL SHORTNESS OF BREATH ?*UNUSUAL BRUISING OR BLEEDING ?*URINARY PROBLEMS (pain or burning when urinating, or frequent urination) ?*BOWEL PROBLEMS (unusual diarrhea, constipation, pain near the anus) ?TENDERNESS IN MOUTH AND THROAT WITH OR WITHOUT PRESENCE OF ULCERS (sore throat, sores in mouth, or a toothache) ?UNUSUAL RASH, SWELLING OR PAIN  ?UNUSUAL VAGINAL DISCHARGE OR ITCHING  ? ?Items with * indicate a potential emergency and should be followed up as soon as possible or go to the Emergency Department if any problems should occur. ? ?Please show the  CHEMOTHERAPY ALERT CARD or IMMUNOTHERAPY ALERT CARD at check-in to the Emergency Department and triage nurse. ? ?Should you have questions after your visit or need to cancel or reschedule your appointment, please contact [facility] [phone]  and follow the prompts.  Office hours are 8:00 a.m. to 4:30 p.m. Monday - Friday. Please note that voicemails left after 4:00 p.m. may not be returned until the following business day.  We are closed weekends and major holidays. You have access to a nurse at all times for urgent questions. Please call the main number to the clinic [phone] and follow the prompts. ? ?For any non-urgent questions, you may also contact your provider using MyChart. We now offer e-Visits for anyone 30 and older to request care online for non-urgent symptoms. For details visit [email or web address]. ?  ?Also download the MyChart app! Go to the app store, search "MyChart", open the app, select [facility], and log in with your MyChart username and password. ? ?Due to Covid, a mask is required upon entering the hospital/clinic. If you do not have a mask, one will be given to you upon arrival. For doctor visits, patients may have 1 support person aged 74 or older with them. For treatment visits, patients cannot have anyone with them due to current Covid guidelines and our immunocompromised population.  ?

## 2022-04-13 NOTE — Patient Instructions (Addendum)
[place] at [facility] ?Discharge Instructions ? ? ?You were seen and examined today by Dr. Delton Coombes. ? ?He reviewed the results of your lab work.  Most results are normal/stable. Your potassium and magnesium are low.  We will give you pills in the clinic to help bring those up. Be sure to take your magnesium and potassium pills at home every day as prescribed.  ? ?We will change your treatment to Velcade injection only every other week. STOP taking the Revlimid pills at home.  ? ?Return as scheduled.  ? ? ? ? ?Thank you for choosing [place] at [facility] to provide your oncology and hematology care.  To afford each patient quality time with our provider, please arrive at least 15 minutes before your scheduled appointment time.  ? ?If you have a lab appointment with the [facility] please come in thru the Main Entrance and check in at the main information desk. ? ?You need to re-schedule your appointment should you arrive 10 or more minutes late.  We strive to give you quality time with our providers, and arriving late affects you and other patients whose appointments are after yours.  Also, if you no show three or more times for appointments you may be dismissed from the clinic at the providers discretion.     ?Again, thank you for choosing [facility]  Our hope is that these requests will decrease the amount of time that you wait before being seen by our physicians.       ?_____________________________________________________________ ? ?Should you have questions after your visit to [facility], please contact our office at [phone] and follow the prompts.  Our office hours are 8:00 a.m. and 4:30 p.m. Monday - Friday.  Please note that voicemails left after 4:00 p.m. may not be returned until the following business day.  We are closed weekends and major holidays.  You do have access to a nurse 24-7, just call the main number  to the clinic [phone] and do not press any options, hold on the line and a nurse will answer the phone.   ? ?For prescription refill requests, have your pharmacy contact our office and allow 72 hours.   ? ?Due to Covid, you will need to wear a mask upon entering the hospital. If you do not have a mask, a mask will be given to you at the Main Entrance upon arrival. For doctor visits, patients may have 1 support person age 56 or older with them. For treatment visits, patients can not have anyone with them due to social distancing guidelines and our immunocompromised population.  ? ?   ?

## 2022-04-13 NOTE — Progress Notes (Signed)
Patient presents today for Velcade and Retacrit, patient okay to proceed with both treatments with additional orders for 40 mEq of PO Potassium and 400 mg of Magnesium oxide.  ?Patient presents today for Retacrit injection. Hemoglobin reviewed prior to administration. VSS tolerated without incident or complaint. See MAR for details. Patient stable during and after injection.  ?Patient tolerated Velcade injection with no complaints voiced. Lab work reviewed. See MAR for details. Injection site clean and dry with no bruising or swelling noted. Patient stable during and after injection. Band aid applied. VSS. Patient left in satisfactory condition with no s/s of distress noted.  ?

## 2022-04-13 NOTE — Progress Notes (Signed)
Patient has been examined by Dr. Katragadda, and vital signs and labs have been reviewed. ANC, Creatinine, LFTs, hemoglobin, and platelets are within treatment parameters per M.D. - pt may proceed with treatment.    °

## 2022-04-13 NOTE — Progress Notes (Signed)
Patient is taking Revlimid as prescribed.  She has not missed any doses and reports no side effects at this time.   

## 2022-04-13 NOTE — Progress Notes (Signed)
? ?Brittany Archer ?[address] ?[place], [place] [address] ? ? ?CLINIC:  ?Medical Oncology/Hematology ? ?PCP:  ?[name], MD ?[address] [place] [address] ?[phone] ? ? ?REASON FOR VISIT:  ?Follow-up for multiple myeloma ? ?PRIOR THERAPY: none ? ?NGS Results: not done ? ?CURRENT THERAPY: Velcade 3/4 weeks; Revlimid 20 mg 2/3 weeks ? ?BRIEF ONCOLOGIC HISTORY:  ?Oncology History  ?Multiple myeloma not having achieved remission ([place])  ?01/20/2018 Initial Diagnosis  ? Multiple myeloma not having achieved remission ([place]) ? ?  ?01/26/2018 -  Chemotherapy  ? Patient is on Treatment Plan : MYELOMA  RVD SQ (Bortezomib d 1,8,15 ) q28d x 4 cycles  ? ?  ?  ? ? ?CANCER STAGING: ? Cancer Staging  ?No matching staging information was found for the patient. ? ?INTERVAL HISTORY:  ?[name], a 81 y.o. female, returns for routine follow-up and consideration for next cycle of chemotherapy. [name] was last seen on 03/23/2022. ? ?Due for cycle #53 of Velcade today.  ? ?Overall, she tells me she has been feeling pretty well. She has gained 8 lbs since her last visit. Her energy and eating are good. She denies diarrhea.  ? ?Overall, she feels ready for next cycle of chemo today.  ? ? ?REVIEW OF SYSTEMS:  ?Review of Systems  ?Constitutional:  Negative for appetite change, fatigue and unexpected weight change.  ?Gastrointestinal:  Negative for diarrhea.  ?All other systems reviewed and are negative. ? ?PAST MEDICAL/SURGICAL HISTORY:  ?Past Medical History:  ?Diagnosis Date  ? Breast cancer ([place])   ? left breast/ 2008/ surg/ rad tx  ? Coronary artery disease   ? Diabetes mellitus   ? ?Past Surgical History:  ?Procedure Laterality Date  ? ABDOMINAL HYSTERECTOMY    ? BREAST SURGERY    ? DEBRIDEMENT MANDIBLE N/A 02/22/2020  ? Procedure: INCISION AND DRAINAGE WITH DEBRIDEMENT MANDIBLE;  Surgeon: Michael Litter, DMD;  Location: [facility];  Service: Oral Surgery;  Laterality: N/A;  ? DEBRIDEMENT MANDIBLE  Right 03/19/2021  ? Procedure: DEBRIDEMENT OF BONE RIGHT INTERIOR  MANDIBLE;  Surgeon: Michael Litter, DMD;  Location: [facility];  Service: Oral Surgery;  Laterality: Right;  ? EYE SURGERY  2021  ? cataract removals   ? TOOTH EXTRACTION N/A 02/22/2020  ? Procedure: DENTAL RESTORATION/EXTRACTIONS;  Surgeon: Michael Litter, DMD;  Location: [facility];  Service: Oral Surgery;  Laterality: N/A;  DENTAL KIT REQUESTED  ? ? ?SOCIAL HISTORY:  ?Social History  ? ?Socioeconomic History  ? Marital status: Divorced  ?  Spouse name: Not on file  ? Number of children: Not on file  ? Years of education: Not on file  ? Highest education level: Not on file  ?Occupational History  ? Not on file  ?Tobacco Use  ? Smoking status: Never  ? Smokeless tobacco: Never  ?Vaping Use  ? Vaping Use: Never used  ?Substance and Sexual Activity  ? Alcohol use: No  ? Drug use: No  ? Sexual activity: Yes  ?  Birth control/protection: Surgical  ?Other Topics Concern  ? Not on file  ?Social History Narrative  ? Not on file  ? ?Social Determinants of Health  ? ?Financial Resource Strain: Not on file  ?Food Insecurity: Not on file  ?Transportation Needs: Not on file  ?Physical Activity: Not on file  ?Stress: Not on file  ?Social Connections: Not on file  ?Intimate Partner Violence: Not on file  ? ? ?FAMILY HISTORY:  ?Family  History  ?Problem Relation Age of Onset  ? Obesity Sister   ? ? ?CURRENT MEDICATIONS:  ?Current Outpatient Medications  ?Medication Sig Dispense Refill  ? acyclovir (ZOVIRAX) 400 MG tablet TAKE 1 TABLET BY MOUTH TWICE DAILY (Patient taking differently: Take 400 mg by mouth 2 (two) times daily.) 60 tablet 11  ? aspirin 81 MG tablet Take 1 tablet (81 mg total) by mouth daily with breakfast. 30 tablet 5  ? bortezomib IV (VELCADE) 3.5 MG injection Inject 3.5 mg into the vein once a week. weekly    ? chlorhexidine (PERIDEX) 0.12 % solution Use as directed 15 mLs in the mouth or throat 3 (three) times daily.    ? cholecalciferol (VITAMIN D) 25 MCG (1000  UNIT) tablet 1 capsule    ? dexamethasone (DECADRON) 4 MG tablet Take 10 tablets (40 mg) on days 1, 8, and 15 of chemo. Repeat every 21 days. (Patient taking differently: Take 4 mg by mouth See admin instructions. Take 10 tablets (40 mg) on days 1, 8, and 15 of chemo. Repeat every 21 days.) 30 tablet 3  ? glucose blood (ACCU-CHEK AVIVA PLUS) test strip CHECK BLOOD SUGAR ONCE DAILY    ? lenalidomide (REVLIMID) 20 MG capsule Take 20 mg capsule by mouth once daily for 14 days on, and 7 days off of a 21 day cycle. 14 capsule 0  ? magnesium oxide (MAG-OX) 400 (240 Mg) MG tablet Take 1 tablet (400 mg total) by mouth in the morning, at noon, and at bedtime. 90 tablet 6  ? mirtazapine (REMERON) 15 MG tablet Take 0.5 tablets (7.5 mg total) by mouth at bedtime. For appetite stimulation 30 tablet 2  ? ondansetron (ZOFRAN) 8 MG tablet 1 tablet    ? Potassium Acetate POWD Take 20 mEq by mouth daily. 12000 g 5  ? ?No current facility-administered medications for this visit.  ? ? ?ALLERGIES:  ?Allergies  ?Allergen Reactions  ? Other Other (See Comments)  ? Seasonal Ic [Cholestatin] Other (See Comments)  ?  Sneezing, watery eyes  ? Motrin [Ibuprofen] Rash  ? ? ?PHYSICAL EXAM:  ?Performance status (ECOG): 1 - Symptomatic but completely ambulatory ? ?Vitals:  ? 04/13/22 1045  ?BP: (!) 156/65  ?Pulse: (!) 51  ?Resp: 20  ?Temp: (!) 96.6 ?F (35.9 ?C)  ?SpO2: 98%  ? ?Wt Readings from Last 3 Encounters:  ?04/13/22 147 lb 4.8 oz (66.8 kg)  ?03/23/22 139 lb 14.4 oz (63.5 kg)  ?03/14/22 137 lb (62.1 kg)  ? ?Physical Exam ?Vitals reviewed.  ?Constitutional:   ?   Appearance: Normal appearance.  ?Cardiovascular:  ?   Rate and Rhythm: Normal rate and regular rhythm.  ?   Pulses: Normal pulses.  ?   Heart sounds: Normal heart sounds.  ?Pulmonary:  ?   Effort: Pulmonary effort is normal.  ?   Breath sounds: Normal breath sounds.  ?Neurological:  ?   General: No focal deficit present.  ?   Mental Status: She is alert and oriented to person, place,  and time.  ?Psychiatric:     ?   Mood and Affect: Mood normal.     ?   Behavior: Behavior normal.  ? ? ?LABORATORY DATA:  ?I have reviewed the labs as listed.  ? ?  Latest Ref Rng & Units 04/06/2022  ? 10:47 AM 03/23/2022  ?  8:28 AM 03/14/2022  ?  1:19 PM  ?CBC  ?WBC 4.0 - 10.5 K/uL 4.0   4.3   5.5    ?  Hemoglobin 12.0 - 15.0 g/dL 8.2   8.3   8.7    ?Hematocrit 36.0 - 46.0 % 25.6   26.3   27.8    ?Platelets 150 - 400 K/uL 222   248   303    ? ? ?  Latest Ref Rng & Units 04/06/2022  ? 10:47 AM 03/23/2022  ?  8:28 AM 03/14/2022  ?  5:53 PM  ?CMP  ?Glucose 70 - 99 mg/dL 109   116   227    ?BUN 8 - 23 mg/dL '11   19   27    ' ?Creatinine 0.44 - 1.00 mg/dL 0.98   1.50   1.78    ?Sodium 135 - 145 mmol/L 143   137   132    ?Potassium 3.5 - 5.1 mmol/L 2.9   4.9   3.6    ?Chloride 98 - 111 mmol/L 115   108   100    ?CO2 22 - 32 mmol/L '19   23   22    ' ?Calcium 8.9 - 10.3 mg/dL 8.8   9.3   9.1    ?Total Protein 6.5 - 8.1 g/dL 6.2   6.4     ?Total Bilirubin 0.3 - 1.2 mg/dL 0.4   0.3     ?Alkaline Phos 38 - 126 U/L 85   74     ?AST 15 - 41 U/L 12   12     ?ALT 0 - 44 U/L 6   9     ? ? ?DIAGNOSTIC IMAGING:  ?I have independently reviewed the scans and discussed with the patient. ?DG Chest Port 1 View ? ?Result Date: 03/14/2022 ?CLINICAL DATA:  Possible sepsis. EXAM: PORTABLE CHEST 1 VIEW COMPARISON:  03/25/2020 FINDINGS: 1303 hours. The lungs are clear without focal pneumonia, edema, pneumothorax or pleural effusion. Cardiopericardial silhouette is at upper limits of normal for size. The visualized bony structures of the thorax are unremarkable. Telemetry leads overlie the chest. IMPRESSION: No active disease. Electronically Signed   By: Misty Stanley M.D.   On: 03/14/2022 13:10    ? ?ASSESSMENT:  ?1.  IgA kappa plasma cell myeloma, stage I: ?-RVD started on 01/09/2018, held since 02/14/2020 due to mandible abscess. ?-Myeloma labs on 05/14/2020 showed progression with M spike of 0.5 g. ?-RVD started back on 05/21/2020. ?-Myeloma labs on  07/10/2020 shows M spike improved to 0.3 g from 0.6 g previously.  Free light chain ratio is 1.74 with kappa light chains 28.4. ?-Myeloma panel from 08/14/2020 shows M spike 0.4 g.  Kappa light chains are 24.8 a

## 2022-04-27 ENCOUNTER — Inpatient Hospital Stay (HOSPITAL_COMMUNITY): Payer: Medicare Other

## 2022-04-27 ENCOUNTER — Inpatient Hospital Stay (HOSPITAL_COMMUNITY): Payer: Medicare Other | Attending: Hematology

## 2022-04-27 VITALS — BP 134/63 | HR 49 | Temp 97.0°F | Resp 17

## 2022-04-27 DIAGNOSIS — C9000 Multiple myeloma not having achieved remission: Secondary | ICD-10-CM | POA: Diagnosis present

## 2022-04-27 DIAGNOSIS — D631 Anemia in chronic kidney disease: Secondary | ICD-10-CM | POA: Insufficient documentation

## 2022-04-27 DIAGNOSIS — N1832 Chronic kidney disease, stage 3b: Secondary | ICD-10-CM | POA: Diagnosis not present

## 2022-04-27 DIAGNOSIS — E876 Hypokalemia: Secondary | ICD-10-CM | POA: Insufficient documentation

## 2022-04-27 DIAGNOSIS — E538 Deficiency of other specified B group vitamins: Secondary | ICD-10-CM | POA: Insufficient documentation

## 2022-04-27 DIAGNOSIS — Z5112 Encounter for antineoplastic immunotherapy: Secondary | ICD-10-CM | POA: Insufficient documentation

## 2022-04-27 LAB — CBC WITH DIFFERENTIAL/PLATELET
Abs Immature Granulocytes: 0.01 10*3/uL (ref 0.00–0.07)
Basophils Absolute: 0 10*3/uL (ref 0.0–0.1)
Basophils Relative: 1 %
Eosinophils Absolute: 0.1 10*3/uL (ref 0.0–0.5)
Eosinophils Relative: 2 %
HCT: 28.2 % — ABNORMAL LOW (ref 36.0–46.0)
Hemoglobin: 9.2 g/dL — ABNORMAL LOW (ref 12.0–15.0)
Immature Granulocytes: 0 %
Lymphocytes Relative: 23 %
Lymphs Abs: 0.8 10*3/uL (ref 0.7–4.0)
MCH: 33.7 pg (ref 26.0–34.0)
MCHC: 32.6 g/dL (ref 30.0–36.0)
MCV: 103.3 fL — ABNORMAL HIGH (ref 80.0–100.0)
Monocytes Absolute: 0.4 10*3/uL (ref 0.1–1.0)
Monocytes Relative: 11 %
Neutro Abs: 2.1 10*3/uL (ref 1.7–7.7)
Neutrophils Relative %: 63 %
Platelets: 175 10*3/uL (ref 150–400)
RBC: 2.73 MIL/uL — ABNORMAL LOW (ref 3.87–5.11)
RDW: 18.6 % — ABNORMAL HIGH (ref 11.5–15.5)
WBC: 3.3 10*3/uL — ABNORMAL LOW (ref 4.0–10.5)
nRBC: 0 % (ref 0.0–0.2)

## 2022-04-27 LAB — COMPREHENSIVE METABOLIC PANEL
ALT: 8 U/L (ref 0–44)
AST: 11 U/L — ABNORMAL LOW (ref 15–41)
Albumin: 3.6 g/dL (ref 3.5–5.0)
Alkaline Phosphatase: 89 U/L (ref 38–126)
Anion gap: 5 (ref 5–15)
BUN: 22 mg/dL (ref 8–23)
CO2: 22 mmol/L (ref 22–32)
Calcium: 9.1 mg/dL (ref 8.9–10.3)
Chloride: 114 mmol/L — ABNORMAL HIGH (ref 98–111)
Creatinine, Ser: 1.04 mg/dL — ABNORMAL HIGH (ref 0.44–1.00)
GFR, Estimated: 54 mL/min — ABNORMAL LOW (ref >60)
Glucose, Bld: 109 mg/dL — ABNORMAL HIGH (ref 70–99)
Potassium: 2.7 mmol/L — CL (ref 3.5–5.1)
Sodium: 141 mmol/L (ref 135–145)
Total Bilirubin: 0.8 mg/dL (ref 0.3–1.2)
Total Protein: 6.8 g/dL (ref 6.5–8.1)

## 2022-04-27 LAB — IRON AND TIBC
Iron: 39 ug/dL (ref 28–170)
Saturation Ratios: 30 % (ref 10.4–31.8)
TIBC: 128 ug/dL — ABNORMAL LOW (ref 250–450)
UIBC: 89 ug/dL

## 2022-04-27 LAB — LACTATE DEHYDROGENASE: LDH: 129 U/L (ref 98–192)

## 2022-04-27 LAB — MAGNESIUM: Magnesium: 1.7 mg/dL (ref 1.7–2.4)

## 2022-04-27 LAB — FERRITIN: Ferritin: 589 ng/mL — ABNORMAL HIGH (ref 11–307)

## 2022-04-27 MED ORDER — EPOETIN ALFA-EPBX 20000 UNIT/ML IJ SOLN
20000.0000 [IU] | Freq: Once | INTRAMUSCULAR | Status: AC
  Administered 2022-04-27: 20000 [IU] via SUBCUTANEOUS
  Filled 2022-04-27: qty 1

## 2022-04-27 MED ORDER — DEXAMETHASONE 4 MG PO TABS
20.0000 mg | ORAL_TABLET | Freq: Once | ORAL | Status: AC
  Administered 2022-04-27: 20 mg via ORAL
  Filled 2022-04-27: qty 5

## 2022-04-27 MED ORDER — PROCHLORPERAZINE MALEATE 10 MG PO TABS
10.0000 mg | ORAL_TABLET | Freq: Once | ORAL | Status: AC
  Administered 2022-04-27: 10 mg via ORAL
  Filled 2022-04-27: qty 1

## 2022-04-27 MED ORDER — POTASSIUM CHLORIDE 10 MEQ/100ML IV SOLN
10.0000 meq | INTRAVENOUS | Status: AC
  Administered 2022-04-27 (×2): 10 meq via INTRAVENOUS
  Filled 2022-04-27 (×2): qty 100

## 2022-04-27 MED ORDER — BORTEZOMIB CHEMO SQ INJECTION 3.5 MG (2.5MG/ML)
1.3000 mg/m2 | Freq: Once | INTRAMUSCULAR | Status: AC
  Administered 2022-04-27: 2.25 mg via SUBCUTANEOUS
  Filled 2022-04-27: qty 0.9

## 2022-04-27 MED ORDER — CYANOCOBALAMIN 1000 MCG/ML IJ SOLN
1000.0000 ug | Freq: Once | INTRAMUSCULAR | Status: AC
  Administered 2022-04-27: 1000 ug via INTRAMUSCULAR
  Filled 2022-04-27: qty 1

## 2022-04-27 MED ORDER — SODIUM CHLORIDE 0.9 % IV SOLN: INTRAVENOUS | Status: DC

## 2022-04-27 MED ORDER — POTASSIUM CHLORIDE CRYS ER 20 MEQ PO TBCR
40.0000 meq | EXTENDED_RELEASE_TABLET | ORAL | Status: AC
  Administered 2022-04-27: 40 meq via ORAL
  Filled 2022-04-27: qty 2

## 2022-04-27 NOTE — Progress Notes (Signed)
Patient tolerated Velcade, Vitamin B12, and Retacrit injection with no complaints voiced.  Lab work reviewed.  See MAR for details.  Injection site clean and dry with no bruising or swelling noted.  Patient stable during and after injection.  Band aid applied.  VSS.  Patient left in satisfactory condition with no s/s of distress noted.   ? ? ?

## 2022-04-27 NOTE — Progress Notes (Signed)
CRITICAL VALUE ALERT ?Critical value received:  K+ 2.7 ?Date of notification:  04-27-22 ?Time of notification: [phone] ?Critical value read back:  Yes.   ?Nurse who received alert:  C. Zariya Minner RN ?MD notified time and response:  0926. Will give additional K+ per orders.   ?

## 2022-04-27 NOTE — Patient Instructions (Signed)
[facility]  Discharge Instructions: ?Thank you for choosing [facility] to provide your oncology and hematology care.  ?If you have a lab appointment with the [facility], please come in thru the Main Entrance and check in at the main information desk. ? ?Wear comfortable clothing and clothing appropriate for easy access to any Portacath or PICC line.  ? ?We strive to give you quality time with your provider. You may need to reschedule your appointment if you arrive late (15 or more minutes).  Arriving late affects you and other patients whose appointments are after yours.  Also, if you miss three or more appointments without notifying the office, you may be dismissed from the clinic at the provider?s discretion.    ?  ?For prescription refill requests, have your pharmacy contact our office and allow 72 hours for refills to be completed.   ? ?Today you received the following chemotherapy and/or immunotherapy agents velcade.     ?  ?To help prevent nausea and vomiting after your treatment, we encourage you to take your nausea medication as directed. ? ?BELOW ARE SYMPTOMS THAT SHOULD BE REPORTED IMMEDIATELY: ?*FEVER GREATER THAN 100.4 F (38 ?C) OR HIGHER ?*CHILLS OR SWEATING ?*NAUSEA AND VOMITING THAT IS NOT CONTROLLED WITH YOUR NAUSEA MEDICATION ?*UNUSUAL SHORTNESS OF BREATH ?*UNUSUAL BRUISING OR BLEEDING ?*URINARY PROBLEMS (pain or burning when urinating, or frequent urination) ?*BOWEL PROBLEMS (unusual diarrhea, constipation, pain near the anus) ?TENDERNESS IN MOUTH AND THROAT WITH OR WITHOUT PRESENCE OF ULCERS (sore throat, sores in mouth, or a toothache) ?UNUSUAL RASH, SWELLING OR PAIN  ?UNUSUAL VAGINAL DISCHARGE OR ITCHING  ? ?Items with * indicate a potential emergency and should be followed up as soon as possible or go to the Emergency Department if any problems should occur. ? ?Please show the CHEMOTHERAPY ALERT CARD or IMMUNOTHERAPY ALERT CARD at check-in to the Emergency  Department and triage nurse. ? ?Should you have questions after your visit or need to cancel or reschedule your appointment, please contact [facility] [phone]  and follow the prompts.  Office hours are 8:00 a.m. to 4:30 p.m. Monday - Friday. Please note that voicemails left after 4:00 p.m. may not be returned until the following business day.  We are closed weekends and major holidays. You have access to a nurse at all times for urgent questions. Please call the main number to the clinic [phone] and follow the prompts. ? ?For any non-urgent questions, you may also contact your provider using MyChart. We now offer e-Visits for anyone 82 and older to request care online for non-urgent symptoms. For details visit [email or web address]. ?  ?Also download the MyChart app! Go to the app store, search "MyChart", open the app, select [facility], and log in with your MyChart username and password. ? ?Due to Covid, a mask is required upon entering the hospital/clinic. If you do not have a mask, one will be given to you upon arrival. For doctor visits, patients may have 1 support person aged 17 or older with them. For treatment visits, patients cannot have anyone with them due to current Covid guidelines and our immunocompromised population.  ?

## 2022-04-28 ENCOUNTER — Other Ambulatory Visit (HOSPITAL_COMMUNITY): Payer: Self-pay

## 2022-04-28 LAB — KAPPA/LAMBDA LIGHT CHAINS
Kappa free light chain: 48.6 mg/L — ABNORMAL HIGH (ref 3.3–19.4)
Kappa, lambda light chain ratio: 2.52 — ABNORMAL HIGH (ref 0.26–1.65)
Lambda free light chains: 19.3 mg/L (ref 5.7–26.3)

## 2022-04-28 LAB — PROTEIN ELECTROPHORESIS, SERUM
A/G Ratio: 1.2 (ref 0.7–1.7)
Albumin ELP: 3.4 g/dL (ref 2.9–4.4)
Alpha-1-Globulin: 0.2 g/dL (ref 0.0–0.4)
Alpha-2-Globulin: 0.8 g/dL (ref 0.4–1.0)
Beta Globulin: 0.9 g/dL (ref 0.7–1.3)
Gamma Globulin: 1 g/dL (ref 0.4–1.8)
Globulin, Total: 2.9 g/dL (ref 2.2–3.9)
Total Protein ELP: 6.3 g/dL (ref 6.0–8.5)

## 2022-05-11 ENCOUNTER — Encounter (HOSPITAL_COMMUNITY): Payer: Self-pay

## 2022-05-11 ENCOUNTER — Inpatient Hospital Stay (HOSPITAL_COMMUNITY): Payer: Medicare Other

## 2022-05-11 VITALS — BP 165/68 | HR 61 | Temp 97.6°F | Resp 18 | Wt 149.4 lb

## 2022-05-11 DIAGNOSIS — E876 Hypokalemia: Secondary | ICD-10-CM

## 2022-05-11 DIAGNOSIS — Z5112 Encounter for antineoplastic immunotherapy: Secondary | ICD-10-CM | POA: Diagnosis not present

## 2022-05-11 DIAGNOSIS — C9000 Multiple myeloma not having achieved remission: Secondary | ICD-10-CM

## 2022-05-11 DIAGNOSIS — E538 Deficiency of other specified B group vitamins: Secondary | ICD-10-CM

## 2022-05-11 LAB — CBC WITH DIFFERENTIAL/PLATELET
Abs Immature Granulocytes: 0 10*3/uL (ref 0.00–0.07)
Basophils Absolute: 0 10*3/uL (ref 0.0–0.1)
Basophils Relative: 1 %
Eosinophils Absolute: 0.1 10*3/uL (ref 0.0–0.5)
Eosinophils Relative: 1 %
HCT: 30.1 % — ABNORMAL LOW (ref 36.0–46.0)
Hemoglobin: 9.9 g/dL — ABNORMAL LOW (ref 12.0–15.0)
Immature Granulocytes: 0 %
Lymphocytes Relative: 28 %
Lymphs Abs: 1 10*3/uL (ref 0.7–4.0)
MCH: 34.1 pg — ABNORMAL HIGH (ref 26.0–34.0)
MCHC: 32.9 g/dL (ref 30.0–36.0)
MCV: 103.8 fL — ABNORMAL HIGH (ref 80.0–100.0)
Monocytes Absolute: 0.3 10*3/uL (ref 0.1–1.0)
Monocytes Relative: 9 %
Neutro Abs: 2.2 10*3/uL (ref 1.7–7.7)
Neutrophils Relative %: 61 %
Platelets: 197 10*3/uL (ref 150–400)
RBC: 2.9 MIL/uL — ABNORMAL LOW (ref 3.87–5.11)
RDW: 17.3 % — ABNORMAL HIGH (ref 11.5–15.5)
WBC: 3.6 10*3/uL — ABNORMAL LOW (ref 4.0–10.5)
nRBC: 0 % (ref 0.0–0.2)

## 2022-05-11 LAB — COMPREHENSIVE METABOLIC PANEL
ALT: 9 U/L (ref 0–44)
AST: 11 U/L — ABNORMAL LOW (ref 15–41)
Albumin: 3.8 g/dL (ref 3.5–5.0)
Alkaline Phosphatase: 106 U/L (ref 38–126)
Anion gap: 4 — ABNORMAL LOW (ref 5–15)
BUN: 20 mg/dL (ref 8–23)
CO2: 20 mmol/L — ABNORMAL LOW (ref 22–32)
Calcium: 9.2 mg/dL (ref 8.9–10.3)
Chloride: 114 mmol/L — ABNORMAL HIGH (ref 98–111)
Creatinine, Ser: 1.28 mg/dL — ABNORMAL HIGH (ref 0.44–1.00)
GFR, Estimated: 42 mL/min — ABNORMAL LOW (ref >60)
Glucose, Bld: 130 mg/dL — ABNORMAL HIGH (ref 70–99)
Potassium: 2.1 mmol/L — CL (ref 3.5–5.1)
Sodium: 138 mmol/L (ref 135–145)
Total Bilirubin: 0.6 mg/dL (ref 0.3–1.2)
Total Protein: 6.7 g/dL (ref 6.5–8.1)

## 2022-05-11 LAB — MAGNESIUM: Magnesium: 1.9 mg/dL (ref 1.7–2.4)

## 2022-05-11 MED ORDER — SODIUM CHLORIDE 0.9 % IV SOLN: INTRAVENOUS | Status: DC

## 2022-05-11 MED ORDER — DEXAMETHASONE 4 MG PO TABS
20.0000 mg | ORAL_TABLET | Freq: Once | ORAL | Status: AC
  Administered 2022-05-11: 20 mg via ORAL
  Filled 2022-05-11: qty 5

## 2022-05-11 MED ORDER — BORTEZOMIB CHEMO SQ INJECTION 3.5 MG (2.5MG/ML)
1.3000 mg/m2 | Freq: Once | INTRAMUSCULAR | Status: AC
  Administered 2022-05-11: 2.25 mg via SUBCUTANEOUS
  Filled 2022-05-11: qty 0.9

## 2022-05-11 MED ORDER — POTASSIUM CHLORIDE CRYS ER 20 MEQ PO TBCR
40.0000 meq | EXTENDED_RELEASE_TABLET | Freq: Once | ORAL | Status: AC
  Administered 2022-05-11: 40 meq via ORAL
  Filled 2022-05-11: qty 2

## 2022-05-11 MED ORDER — POTASSIUM CHLORIDE 10 MEQ/100ML IV SOLN
10.0000 meq | INTRAVENOUS | Status: AC
  Administered 2022-05-11 (×2): 10 meq via INTRAVENOUS
  Filled 2022-05-11 (×2): qty 100

## 2022-05-11 MED ORDER — PROCHLORPERAZINE MALEATE 10 MG PO TABS
10.0000 mg | ORAL_TABLET | Freq: Once | ORAL | Status: AC
  Administered 2022-05-11: 10 mg via ORAL
  Filled 2022-05-11: qty 1

## 2022-05-11 MED ORDER — EPOETIN ALFA-EPBX 20000 UNIT/ML IJ SOLN
20000.0000 [IU] | Freq: Once | INTRAMUSCULAR | Status: AC
  Administered 2022-05-11: 20000 [IU] via SUBCUTANEOUS
  Filled 2022-05-11: qty 1

## 2022-05-11 NOTE — Patient Instructions (Signed)
[facility]  Discharge Instructions: ?Thank you for choosing [facility] to provide your oncology and hematology care.  ?If you have a lab appointment with the [facility], please come in thru the Main Entrance and check in at the main information desk. ? ?Wear comfortable clothing and clothing appropriate for easy access to any Portacath or PICC line.  ? ?We strive to give you quality time with your provider. You may need to reschedule your appointment if you arrive late (15 or more minutes).  Arriving late affects you and other patients whose appointments are after yours.  Also, if you miss three or more appointments without notifying the office, you may be dismissed from the clinic at the provider?s discretion.    ?  ?For prescription refill requests, have your pharmacy contact our office and allow 72 hours for refills to be completed.   ? ?Today you received the following chemotherapy and/or immunotherapy agents Velcade, Retacrit, and Potassium. Return as scheduled. ?  ?To help prevent nausea and vomiting after your treatment, we encourage you to take your nausea medication as directed. ? ?BELOW ARE SYMPTOMS THAT SHOULD BE REPORTED IMMEDIATELY: ?*FEVER GREATER THAN 100.4 F (38 ?C) OR HIGHER ?*CHILLS OR SWEATING ?*NAUSEA AND VOMITING THAT IS NOT CONTROLLED WITH YOUR NAUSEA MEDICATION ?*UNUSUAL SHORTNESS OF BREATH ?*UNUSUAL BRUISING OR BLEEDING ?*URINARY PROBLEMS (pain or burning when urinating, or frequent urination) ?*BOWEL PROBLEMS (unusual diarrhea, constipation, pain near the anus) ?TENDERNESS IN MOUTH AND THROAT WITH OR WITHOUT PRESENCE OF ULCERS (sore throat, sores in mouth, or a toothache) ?UNUSUAL RASH, SWELLING OR PAIN  ?UNUSUAL VAGINAL DISCHARGE OR ITCHING  ? ?Items with * indicate a potential emergency and should be followed up as soon as possible or go to the Emergency Department if any problems should occur. ? ?Please show the CHEMOTHERAPY ALERT CARD or IMMUNOTHERAPY  ALERT CARD at check-in to the Emergency Department and triage nurse. ? ?Should you have questions after your visit or need to cancel or reschedule your appointment, please contact [facility] [phone]  and follow the prompts.  Office hours are 8:00 a.m. to 4:30 p.m. Monday - Friday. Please note that voicemails left after 4:00 p.m. may not be returned until the following business day.  We are closed weekends and major holidays. You have access to a nurse at all times for urgent questions. Please call the main number to the clinic [phone] and follow the prompts. ? ?For any non-urgent questions, you may also contact your provider using MyChart. We now offer e-Visits for anyone 39 and older to request care online for non-urgent symptoms. For details visit [email or web address]. ?  ?Also download the MyChart app! Go to the app store, search "MyChart", open the app, select [facility], and log in with your MyChart username and password. ? ?Due to Covid, a mask is required upon entering the hospital/clinic. If you do not have a mask, one will be given to you upon arrival. For doctor visits, patients may have 1 support person aged 43 or older with them. For treatment visits, patients cannot have anyone with them due to current Covid guidelines and our immunocompromised population.  ?

## 2022-05-11 NOTE — Progress Notes (Signed)
Patient presents today for Velcade and Retacrit. Patient's potassium 2.1, Dr. Delton Coombes made aware, received orders for 40 mEq PO potassium, 20 mEq IV potassium and 40 mEq of PO potassium.  ?Patient tolerated Velcade injection with no complaints voiced. Lab work reviewed. See MAR for details. Injection site clean and dry with no bruising or swelling noted. Patient stable during and after injection. Band aid applied.  ?Patient presents today for Retacrit injection. Hemoglobin reviewed prior to administration. VSS tolerated without incident or complaint. See MAR for details. Patient stable during and after injection.  ?Patient tolerated potassium infusions with no complaints voiced. Peripheral IV site clean and dry with good blood return noted before and after infusion. Band aid applied. VSS with discharge and left in satisfactory condition with no s/s of distress noted.   ?

## 2022-05-26 ENCOUNTER — Inpatient Hospital Stay (HOSPITAL_COMMUNITY): Payer: Medicare Other

## 2022-05-26 VITALS — BP 160/67 | HR 59 | Temp 98.0°F | Resp 16 | Wt 154.1 lb

## 2022-05-26 DIAGNOSIS — C9000 Multiple myeloma not having achieved remission: Secondary | ICD-10-CM

## 2022-05-26 DIAGNOSIS — Z5112 Encounter for antineoplastic immunotherapy: Secondary | ICD-10-CM | POA: Diagnosis not present

## 2022-05-26 DIAGNOSIS — E538 Deficiency of other specified B group vitamins: Secondary | ICD-10-CM

## 2022-05-26 LAB — CBC WITH DIFFERENTIAL/PLATELET
Abs Immature Granulocytes: 0.01 10*3/uL (ref 0.00–0.07)
Basophils Absolute: 0 10*3/uL (ref 0.0–0.1)
Basophils Relative: 1 %
Eosinophils Absolute: 0.1 10*3/uL (ref 0.0–0.5)
Eosinophils Relative: 2 %
HCT: 33.4 % — ABNORMAL LOW (ref 36.0–46.0)
Hemoglobin: 10.7 g/dL — ABNORMAL LOW (ref 12.0–15.0)
Immature Granulocytes: 0 %
Lymphocytes Relative: 26 %
Lymphs Abs: 1.1 10*3/uL (ref 0.7–4.0)
MCH: 33.5 pg (ref 26.0–34.0)
MCHC: 32 g/dL (ref 30.0–36.0)
MCV: 104.7 fL — ABNORMAL HIGH (ref 80.0–100.0)
Monocytes Absolute: 0.4 10*3/uL (ref 0.1–1.0)
Monocytes Relative: 9 %
Neutro Abs: 2.6 10*3/uL (ref 1.7–7.7)
Neutrophils Relative %: 62 %
Platelets: 165 10*3/uL (ref 150–400)
RBC: 3.19 MIL/uL — ABNORMAL LOW (ref 3.87–5.11)
RDW: 15.4 % (ref 11.5–15.5)
WBC: 4.2 10*3/uL (ref 4.0–10.5)
nRBC: 0 % (ref 0.0–0.2)

## 2022-05-26 LAB — COMPREHENSIVE METABOLIC PANEL
ALT: 13 U/L (ref 0–44)
AST: 16 U/L (ref 15–41)
Albumin: 3.4 g/dL — ABNORMAL LOW (ref 3.5–5.0)
Alkaline Phosphatase: 93 U/L (ref 38–126)
Anion gap: 6 (ref 5–15)
BUN: 15 mg/dL (ref 8–23)
CO2: 28 mmol/L (ref 22–32)
Calcium: 9.3 mg/dL (ref 8.9–10.3)
Chloride: 110 mmol/L (ref 98–111)
Creatinine, Ser: 1.01 mg/dL — ABNORMAL HIGH (ref 0.44–1.00)
GFR, Estimated: 56 mL/min — ABNORMAL LOW (ref >60)
Glucose, Bld: 120 mg/dL — ABNORMAL HIGH (ref 70–99)
Potassium: 2.8 mmol/L — ABNORMAL LOW (ref 3.5–5.1)
Sodium: 144 mmol/L (ref 135–145)
Total Bilirubin: 0.7 mg/dL (ref 0.3–1.2)
Total Protein: 6.2 g/dL — ABNORMAL LOW (ref 6.5–8.1)

## 2022-05-26 LAB — MAGNESIUM: Magnesium: 1.6 mg/dL — ABNORMAL LOW (ref 1.7–2.4)

## 2022-05-26 MED ORDER — CYANOCOBALAMIN 1000 MCG/ML IJ SOLN
1000.0000 ug | Freq: Once | INTRAMUSCULAR | Status: AC
  Administered 2022-05-26: 1000 ug via INTRAMUSCULAR
  Filled 2022-05-26: qty 1

## 2022-05-26 MED ORDER — PROCHLORPERAZINE MALEATE 10 MG PO TABS
10.0000 mg | ORAL_TABLET | Freq: Once | ORAL | Status: AC
  Administered 2022-05-26: 10 mg via ORAL
  Filled 2022-05-26: qty 1

## 2022-05-26 MED ORDER — DEXAMETHASONE 4 MG PO TABS
20.0000 mg | ORAL_TABLET | Freq: Once | ORAL | Status: AC
  Administered 2022-05-26: 20 mg via ORAL
  Filled 2022-05-26: qty 5

## 2022-05-26 MED ORDER — MAGNESIUM SULFATE 2 GM/50ML IV SOLN
2.0000 g | INTRAVENOUS | Status: AC
  Administered 2022-05-26: 2 g via INTRAVENOUS
  Filled 2022-05-26: qty 50

## 2022-05-26 MED ORDER — EPOETIN ALFA-EPBX 20000 UNIT/ML IJ SOLN: 20000.0000 [IU] | Freq: Once | INTRAMUSCULAR | Status: DC

## 2022-05-26 MED ORDER — POTASSIUM CHLORIDE CRYS ER 20 MEQ PO TBCR
40.0000 meq | EXTENDED_RELEASE_TABLET | ORAL | Status: AC
  Administered 2022-05-26 (×2): 40 meq via ORAL
  Filled 2022-05-26 (×2): qty 2

## 2022-05-26 MED ORDER — POTASSIUM CHLORIDE 10 MEQ/100ML IV SOLN
10.0000 meq | INTRAVENOUS | Status: AC
  Administered 2022-05-26 (×2): 10 meq via INTRAVENOUS
  Filled 2022-05-26 (×2): qty 100

## 2022-05-26 MED ORDER — SODIUM CHLORIDE 0.9 % IV SOLN: Freq: Once | INTRAVENOUS | Status: AC

## 2022-05-26 MED ORDER — BORTEZOMIB CHEMO SQ INJECTION 3.5 MG (2.5MG/ML)
1.3000 mg/m2 | Freq: Once | INTRAMUSCULAR | Status: AC
  Administered 2022-05-26: 2.25 mg via SUBCUTANEOUS
  Filled 2022-05-26: qty 0.9

## 2022-05-26 NOTE — Progress Notes (Signed)
Labs meet parameters for treatment today.  

## 2022-05-26 NOTE — Patient Instructions (Signed)
[facility]  Discharge Instructions: Thank you for choosing [facility] to provide your oncology and hematology care.  If you have a lab appointment with the [facility], please come in thru the Main Entrance and check in at the main information desk.  Wear comfortable clothing and clothing appropriate for easy access to any Portacath or PICC line.   We strive to give you quality time with your provider. You may need to reschedule your appointment if you arrive late (15 or more minutes).  Arriving late affects you and other patients whose appointments are after yours.  Also, if you miss three or more appointments without notifying the office, you may be dismissed from the clinic at the provider's discretion.      For prescription refill requests, have your pharmacy contact our office and allow 72 hours for refills to be completed.    Today you received, potassium, magnesium and ivf      To help prevent nausea and vomiting after your treatment, we encourage you to take your nausea medication as directed.  BELOW ARE SYMPTOMS THAT SHOULD BE REPORTED IMMEDIATELY: *FEVER GREATER THAN 100.4 F (38 C) OR HIGHER *CHILLS OR SWEATING *NAUSEA AND VOMITING THAT IS NOT CONTROLLED WITH YOUR NAUSEA MEDICATION *UNUSUAL SHORTNESS OF BREATH *UNUSUAL BRUISING OR BLEEDING *URINARY PROBLEMS (pain or burning when urinating, or frequent urination) *BOWEL PROBLEMS (unusual diarrhea, constipation, pain near the anus) TENDERNESS IN MOUTH AND THROAT WITH OR WITHOUT PRESENCE OF ULCERS (sore throat, sores in mouth, or a toothache) UNUSUAL RASH, SWELLING OR PAIN  UNUSUAL VAGINAL DISCHARGE OR ITCHING   Items with * indicate a potential emergency and should be followed up as soon as possible or go to the Emergency Department if any problems should occur.  Please show the CHEMOTHERAPY ALERT CARD or IMMUNOTHERAPY ALERT CARD at check-in to the Emergency Department and triage nurse.  Should  you have questions after your visit or need to cancel or reschedule your appointment, please contact [facility] [phone]  and follow the prompts.  Office hours are 8:00 a.m. to 4:30 p.m. Monday - Friday. Please note that voicemails left after 4:00 p.m. may not be returned until the following business day.  We are closed weekends and major holidays. You have access to a nurse at all times for urgent questions. Please call the main number to the clinic [phone] and follow the prompts.  For any non-urgent questions, you may also contact your provider using MyChart. We now offer e-Visits for anyone 17 and older to request care online for non-urgent symptoms. For details visit [email or web address].   Also download the MyChart app! Go to the app store, search "MyChart", open the app, select [facility], and log in with your MyChart username and password.  Due to Covid, a mask is required upon entering the hospital/clinic. If you do not have a mask, one will be given to you upon arrival. For doctor visits, patients may have 1 support person aged 21 or older with them. For treatment visits, patients cannot have anyone with them due to current Covid guidelines and our immunocompromised population.

## 2022-05-26 NOTE — Addendum Note (Signed)
Addended by: Donnie Aho on: 05/26/2022 11:16 AM   Modules accepted: Orders

## 2022-05-26 NOTE — Addendum Note (Signed)
Addended by: Henreitta Leber E on: 05/26/2022 10:54 AM   Modules accepted: Orders

## 2022-05-26 NOTE — Progress Notes (Signed)
Treatment given per orders. Patient tolerated it well without problems. Vitals stable and discharged home from clinic ambulatory. Follow up as scheduled.  

## 2022-05-26 NOTE — Addendum Note (Signed)
Addended by: Donnie Aho on: 05/26/2022 10:54 AM   Modules accepted: Orders

## 2022-06-02 ENCOUNTER — Inpatient Hospital Stay (HOSPITAL_COMMUNITY): Payer: Medicare Other | Attending: Hematology

## 2022-06-02 ENCOUNTER — Other Ambulatory Visit: Payer: Self-pay | Admitting: Oncology

## 2022-06-02 DIAGNOSIS — Z79899 Other long term (current) drug therapy: Secondary | ICD-10-CM | POA: Diagnosis not present

## 2022-06-02 DIAGNOSIS — D631 Anemia in chronic kidney disease: Secondary | ICD-10-CM | POA: Diagnosis not present

## 2022-06-02 DIAGNOSIS — N1832 Chronic kidney disease, stage 3b: Secondary | ICD-10-CM | POA: Insufficient documentation

## 2022-06-02 DIAGNOSIS — Z5112 Encounter for antineoplastic immunotherapy: Secondary | ICD-10-CM | POA: Diagnosis present

## 2022-06-02 DIAGNOSIS — K047 Periapical abscess without sinus: Secondary | ICD-10-CM | POA: Insufficient documentation

## 2022-06-02 DIAGNOSIS — Z7969 Long term (current) use of other immunomodulators and immunosuppressants: Secondary | ICD-10-CM | POA: Insufficient documentation

## 2022-06-02 DIAGNOSIS — Z886 Allergy status to analgesic agent status: Secondary | ICD-10-CM | POA: Insufficient documentation

## 2022-06-02 DIAGNOSIS — Z8349 Family history of other endocrine, nutritional and metabolic diseases: Secondary | ICD-10-CM | POA: Diagnosis not present

## 2022-06-02 DIAGNOSIS — M272 Inflammatory conditions of jaws: Secondary | ICD-10-CM | POA: Insufficient documentation

## 2022-06-02 DIAGNOSIS — E876 Hypokalemia: Secondary | ICD-10-CM | POA: Insufficient documentation

## 2022-06-02 DIAGNOSIS — C9000 Multiple myeloma not having achieved remission: Secondary | ICD-10-CM | POA: Diagnosis present

## 2022-06-02 LAB — CBC WITH DIFFERENTIAL/PLATELET
Abs Immature Granulocytes: 0.03 10*3/uL (ref 0.00–0.07)
Basophils Absolute: 0 10*3/uL (ref 0.0–0.1)
Basophils Relative: 0 %
Eosinophils Absolute: 0.1 10*3/uL (ref 0.0–0.5)
Eosinophils Relative: 2 %
HCT: 31 % — ABNORMAL LOW (ref 36.0–46.0)
Hemoglobin: 9.9 g/dL — ABNORMAL LOW (ref 12.0–15.0)
Immature Granulocytes: 1 %
Lymphocytes Relative: 27 %
Lymphs Abs: 1.1 10*3/uL (ref 0.7–4.0)
MCH: 33.8 pg (ref 26.0–34.0)
MCHC: 31.9 g/dL (ref 30.0–36.0)
MCV: 105.8 fL — ABNORMAL HIGH (ref 80.0–100.0)
Monocytes Absolute: 0.3 10*3/uL (ref 0.1–1.0)
Monocytes Relative: 8 %
Neutro Abs: 2.6 10*3/uL (ref 1.7–7.7)
Neutrophils Relative %: 62 %
Platelets: 159 10*3/uL (ref 150–400)
RBC: 2.93 MIL/uL — ABNORMAL LOW (ref 3.87–5.11)
RDW: 15 % (ref 11.5–15.5)
WBC: 4.2 10*3/uL (ref 4.0–10.5)
nRBC: 0 % (ref 0.0–0.2)

## 2022-06-02 LAB — COMPREHENSIVE METABOLIC PANEL
ALT: 11 U/L (ref 0–44)
AST: 12 U/L — ABNORMAL LOW (ref 15–41)
Albumin: 3.3 g/dL — ABNORMAL LOW (ref 3.5–5.0)
Alkaline Phosphatase: 83 U/L (ref 38–126)
Anion gap: 9 (ref 5–15)
BUN: 14 mg/dL (ref 8–23)
CO2: 22 mmol/L (ref 22–32)
Calcium: 9.2 mg/dL (ref 8.9–10.3)
Chloride: 111 mmol/L (ref 98–111)
Creatinine, Ser: 1.11 mg/dL — ABNORMAL HIGH (ref 0.44–1.00)
GFR, Estimated: 50 mL/min — ABNORMAL LOW (ref >60)
Glucose, Bld: 190 mg/dL — ABNORMAL HIGH (ref 70–99)
Potassium: 3.3 mmol/L — ABNORMAL LOW (ref 3.5–5.1)
Sodium: 142 mmol/L (ref 135–145)
Total Bilirubin: 0.4 mg/dL (ref 0.3–1.2)
Total Protein: 6.1 g/dL — ABNORMAL LOW (ref 6.5–8.1)

## 2022-06-02 LAB — LACTATE DEHYDROGENASE: LDH: 126 U/L (ref 98–192)

## 2022-06-02 LAB — IRON AND TIBC
Iron: 58 ug/dL (ref 28–170)
Saturation Ratios: 46 % — ABNORMAL HIGH (ref 10.4–31.8)
TIBC: 125 ug/dL — ABNORMAL LOW (ref 250–450)
UIBC: 67 ug/dL

## 2022-06-02 LAB — MAGNESIUM: Magnesium: 1.5 mg/dL — ABNORMAL LOW (ref 1.7–2.4)

## 2022-06-02 LAB — FERRITIN: Ferritin: 136 ng/mL (ref 11–307)

## 2022-06-03 LAB — KAPPA/LAMBDA LIGHT CHAINS
Kappa free light chain: 35.9 mg/L — ABNORMAL HIGH (ref 3.3–19.4)
Kappa, lambda light chain ratio: 2.6 — ABNORMAL HIGH (ref 0.26–1.65)
Lambda free light chains: 13.8 mg/L (ref 5.7–26.3)

## 2022-06-04 LAB — PROTEIN ELECTROPHORESIS, SERUM
A/G Ratio: 1.3 (ref 0.7–1.7)
Albumin ELP: 3.2 g/dL (ref 2.9–4.4)
Alpha-1-Globulin: 0.2 g/dL (ref 0.0–0.4)
Alpha-2-Globulin: 0.7 g/dL (ref 0.4–1.0)
Beta Globulin: 0.8 g/dL (ref 0.7–1.3)
Gamma Globulin: 0.7 g/dL (ref 0.4–1.8)
Globulin, Total: 2.4 g/dL (ref 2.2–3.9)
M-Spike, %: 0.4 g/dL — ABNORMAL HIGH
Total Protein ELP: 5.6 g/dL — ABNORMAL LOW (ref 6.0–8.5)

## 2022-06-09 ENCOUNTER — Inpatient Hospital Stay (HOSPITAL_BASED_OUTPATIENT_CLINIC_OR_DEPARTMENT_OTHER): Payer: Medicare Other | Admitting: Hematology

## 2022-06-09 ENCOUNTER — Inpatient Hospital Stay (HOSPITAL_COMMUNITY): Payer: Medicare Other

## 2022-06-09 VITALS — BP 153/62 | HR 59 | Temp 97.0°F | Resp 17 | Ht 64.0 in | Wt 159.6 lb

## 2022-06-09 DIAGNOSIS — C9000 Multiple myeloma not having achieved remission: Secondary | ICD-10-CM | POA: Diagnosis not present

## 2022-06-09 DIAGNOSIS — Z5112 Encounter for antineoplastic immunotherapy: Secondary | ICD-10-CM | POA: Diagnosis not present

## 2022-06-09 DIAGNOSIS — E538 Deficiency of other specified B group vitamins: Secondary | ICD-10-CM

## 2022-06-09 MED ORDER — EPOETIN ALFA-EPBX 20000 UNIT/ML IJ SOLN
20000.0000 [IU] | Freq: Once | INTRAMUSCULAR | Status: AC
  Administered 2022-06-09: 20000 [IU] via SUBCUTANEOUS
  Filled 2022-06-09: qty 1

## 2022-06-09 MED ORDER — BORTEZOMIB CHEMO SQ INJECTION 3.5 MG (2.5MG/ML)
1.3000 mg/m2 | Freq: Once | INTRAMUSCULAR | Status: AC
  Administered 2022-06-09: 2.25 mg via SUBCUTANEOUS
  Filled 2022-06-09: qty 0.9

## 2022-06-09 MED ORDER — PROCHLORPERAZINE MALEATE 10 MG PO TABS
10.0000 mg | ORAL_TABLET | Freq: Once | ORAL | Status: AC
  Administered 2022-06-09: 10 mg via ORAL
  Filled 2022-06-09: qty 1

## 2022-06-09 MED ORDER — DEXAMETHASONE 4 MG PO TABS
20.0000 mg | ORAL_TABLET | Freq: Once | ORAL | Status: AC
  Administered 2022-06-09: 20 mg via ORAL
  Filled 2022-06-09: qty 5

## 2022-06-09 MED ORDER — MAGNESIUM OXIDE -MG SUPPLEMENT 400 (240 MG) MG PO TABS
400.0000 mg | ORAL_TABLET | Freq: Once | ORAL | Status: AC
  Administered 2022-06-09: 400 mg via ORAL
  Filled 2022-06-09: qty 1

## 2022-06-09 NOTE — Progress Notes (Signed)
Ok for treatment using labs from 06/05/22  T.O. Dr Rhys Martini, PharmD

## 2022-06-09 NOTE — Progress Notes (Signed)
Patient has been examined by Dr. Katragadda, and vital signs and labs have been reviewed. ANC, Creatinine, LFTs, hemoglobin, and platelets are within treatment parameters per M.D. - pt may proceed with treatment.    °

## 2022-06-09 NOTE — Patient Instructions (Signed)
[place] [facility] at [facility] Discharge Instructions   You were seen and examined today by Dr. Delton Coombes.  He reviewed your lab work which is normal/stable. You m-spike is a little elevated at 0.4, but it has done this in the past. We will continue with the same treatment.   We will proceed with Velcade injection today.   Return as scheduled.    Thank you for choosing [facility] at [facility] to provide your oncology and hematology care.  To afford each patient quality time with our provider, please arrive at least 15 minutes before your scheduled appointment time.   If you have a lab appointment with the [facility] please come in thru the Main Entrance and check in at the main information desk.  You need to re-schedule your appointment should you arrive 10 or more minutes late.  We strive to give you quality time with our providers, and arriving late affects you and other patients whose appointments are after yours.  Also, if you no show three or more times for appointments you may be dismissed from the clinic at the providers discretion.     Again, thank you for choosing [facility].  Our hope is that these requests will decrease the amount of time that you wait before being seen by our physicians.       _____________________________________________________________  Should you have questions after your visit to [facility], please contact our office at [phone] and follow the prompts.  Our office hours are 8:00 a.m. and 4:30 p.m. Monday - Friday.  Please note that voicemails left after 4:00 p.m. may not be returned until the following business day.  We are closed weekends and major holidays.  You do have access to a nurse 24-7, just call the main number to the clinic [phone] and do not press any options, hold on the line and a nurse will answer the phone.    For prescription refill requests, have your  pharmacy contact our office and allow 72 hours.    Due to Covid, you will need to wear a mask upon entering the hospital. If you do not have a mask, a mask will be given to you at the Main Entrance upon arrival. For doctor visits, patients may have 1 support person age 68 or older with them. For treatment visits, patients can not have anyone with them due to social distancing guidelines and our immunocompromised population.

## 2022-06-09 NOTE — Progress Notes (Signed)
Patient and labs assessed by Dr. Delton Coombes, patient okay for treatment today with additional order for '400mg'$  of PO Magnesium oxide received. Patient presents today for Retacrit injection. Hemoglobin reviewed prior to administration. VSS tolerated without incident or complaint. See MAR for details. Patient stable during and after injection.  Patient tolerated Velcade injection with no complaints voiced. Lab work reviewed. See MAR for details. Injection site clean and dry with no bruising or swelling noted. Patient stable during and after injection. Band aid applied. VSS. Patient left in satisfactory condition with no s/s of distress noted.

## 2022-06-09 NOTE — Patient Instructions (Signed)
[facility]  Discharge Instructions: Thank you for choosing [facility] to provide your oncology and hematology care.  If you have a lab appointment with the [facility], please come in thru the Main Entrance and check in at the main information desk.  Wear comfortable clothing and clothing appropriate for easy access to any Portacath or PICC line.   We strive to give you quality time with your provider. You may need to reschedule your appointment if you arrive late (15 or more minutes).  Arriving late affects you and other patients whose appointments are after yours.  Also, if you miss three or more appointments without notifying the office, you may be dismissed from the clinic at the provider's discretion.      For prescription refill requests, have your pharmacy contact our office and allow 72 hours for refills to be completed.    Today you received the following chemotherapy and/or immunotherapy agents Velcade and retacrit, return as scheduled. Epoetin Alfa injection What is this medication? EPOETIN ALFA (e POE e tin AL fa) helps your body make more red blood cells. This medicine is used to treat anemia caused by chronic kidney disease, cancer chemotherapy, or HIV-therapy. It may also be used before surgery if you have anemia. This medicine may be used for other purposes; ask your health care provider or pharmacist if you have questions. COMMON BRAND NAME(S): Epogen, Procrit, Retacrit What should I tell my care team before I take this medication? They need to know if you have any of these conditions: cancer heart disease high blood pressure history of blood clots history of stroke low levels of folate, iron, or vitamin B12 in the blood seizures an unusual or allergic reaction to erythropoietin, albumin, benzyl alcohol, hamster proteins, other medicines, foods, dyes, or preservatives pregnant or trying to get pregnant breast-feeding How should I use this  medication? This medicine is for injection into a vein or under the skin. It is usually given by a health care professional in a hospital or clinic setting. If you get this medicine at home, you will be taught how to prepare and give this medicine. Use exactly as directed. Take your medicine at regular intervals. Do not take your medicine more often than directed. It is important that you put your used needles and syringes in a special sharps container. Do not put them in a trash can. If you do not have a sharps container, call your pharmacist or healthcare provider to get one. A special MedGuide will be given to you by the pharmacist with each prescription and refill. Be sure to read this information carefully each time. Talk to your pediatrician regarding the use of this medicine in children. While this drug may be prescribed for selected conditions, precautions do apply. Overdosage: If you think you have taken too much of this medicine contact a poison control center or emergency room at once. NOTE: This medicine is only for you. Do not share this medicine with others. What if I miss a dose? If you miss a dose, take it as soon as you can. If it is almost time for your next dose, take only that dose. Do not take double or extra doses. What may interact with this medication? Interactions have not been studied. This list may not describe all possible interactions. Give your health care provider a list of all the medicines, herbs, non-prescription drugs, or dietary supplements you use. Also tell them if you smoke, drink alcohol, or  use illegal drugs. Some items may interact with your medicine. What should I watch for while using this medication? Your condition will be monitored carefully while you are receiving this medicine. You may need blood work done while you are taking this medicine. This medicine may cause a decrease in vitamin B6. You should make sure that you get enough vitamin B6 while you  are taking this medicine. Discuss the foods you eat and the vitamins you take with your health care professional. What side effects may I notice from receiving this medication? Side effects that you should report to your doctor or health care professional as soon as possible: allergic reactions like skin rash, itching or hives, swelling of the face, lips, or tongue seizures signs and symptoms of a blood clot such as breathing problems; changes in vision; chest pain; severe, sudden headache; pain, swelling, warmth in the leg; trouble speaking; sudden numbness or weakness of the face, arm or leg signs and symptoms of a stroke like changes in vision; confusion; trouble speaking or understanding; severe headaches; sudden numbness or weakness of the face, arm or leg; trouble walking; dizziness; loss of balance or coordination Side effects that usually do not require medical attention (report to your doctor or health care professional if they continue or are bothersome): chills cough dizziness fever headaches joint pain muscle cramps muscle pain nausea, vomiting pain, redness, or irritation at site where injected This list may not describe all possible side effects. Call your doctor for medical advice about side effects. You may report side effects to FDA at 1-800-FDA-1088. Where should I keep my medication? Keep out of the reach of children. Store in a refrigerator between 2 and 8 degrees C (36 and 46 degrees F). Do not freeze or shake. Throw away any unused portion if using a single-dose vial. Multi-dose vials can be kept in the refrigerator for up to 21 days after the initial dose. Throw away unused medicine. NOTE: This sheet is a summary. It may not cover all possible information. If you have questions about this medicine, talk to your doctor, pharmacist, or health care provider.  2023 Elsevier/Gold Standard (2017-08-17 00:00:00) Bortezomib injection What is this medication? BORTEZOMIB (bor  TEZ oh mib) targets proteins in cancer cells and stops the cancer cells from growing. It treats multiple myeloma and mantle cell lymphoma. This medicine may be used for other purposes; ask your health care provider or pharmacist if you have questions. COMMON BRAND NAME(S): Velcade What should I tell my care team before I take this medication? They need to know if you have any of these conditions: dehydration diabetes (high blood sugar) heart disease liver disease tingling of the fingers or toes or other nerve disorder an unusual or allergic reaction to bortezomib, mannitol, boron, other medicines, foods, dyes, or preservatives pregnant or trying to get pregnant breast-feeding How should I use this medication? This medicine is injected into a vein or under the skin. It is given by a health care provider in a hospital or clinic setting. Talk to your health care provider about the use of this medicine in children. Special care may be needed. Overdosage: If you think you have taken too much of this medicine contact a poison control center or emergency room at once. NOTE: This medicine is only for you. Do not share this medicine with others. What if I miss a dose? Keep appointments for follow-up doses. It is important not to miss your dose. Call your health care provider if you  are unable to keep an appointment. What may interact with this medication? This medicine may interact with the following medications: ketoconazole rifampin This list may not describe all possible interactions. Give your health care provider a list of all the medicines, herbs, non-prescription drugs, or dietary supplements you use. Also tell them if you smoke, drink alcohol, or use illegal drugs. Some items may interact with your medicine. What should I watch for while using this medication? Your condition will be monitored carefully while you are receiving this medicine. You may need blood work done while you are taking  this medicine. You may get drowsy or dizzy. Do not drive, use machinery, or do anything that needs mental alertness until you know how this medicine affects you. Do not stand up or sit up quickly, especially if you are an older patient. This reduces the risk of dizzy or fainting spells This medicine may increase your risk of getting an infection. Call your health care provider for advice if you get a fever, chills, sore throat, or other symptoms of a cold or flu. Do not treat yourself. Try to avoid being around people who are sick. Check with your health care provider if you have severe diarrhea, nausea, and vomiting, or if you sweat a lot. The loss of too much body fluid may make it dangerous for you to take this medicine. Do not become pregnant while taking this medicine or for 7 months after stopping it. Women should inform their health care provider if they wish to become pregnant or think they might be pregnant. Men should not father a child while taking this medicine and for 4 months after stopping it. There is a potential for serious harm to an unborn child. Talk to your health care provider for more information. Do not breast-feed an infant while taking this medicine or for 2 months after stopping it. This medicine may make it more difficult to get pregnant or father a child. Talk to your health care provider if you are concerned about your fertility. What side effects may I notice from receiving this medication? Side effects that you should report to your doctor or health care professional as soon as possible: allergic reactions (skin rash; itching or hives; swelling of the face, lips, or tongue) bleeding (bloody or black, tarry stools; red or dark brown urine; spitting up blood or brown material that looks like coffee grounds; red spots on the skin; unusual bruising or bleeding from the eye, gums, or nose) blurred vision or changes in vision confusion constipation headache heart failure  (trouble breathing; fast, irregular heartbeat; sudden weight gain; swelling of the ankles, feet, hands) infection (fever, chills, cough, sore throat, pain or trouble passing urine) lack or loss of appetite liver injury (dark yellow or brown urine; general ill feeling or flu-like symptoms; loss of appetite, right upper belly pain; yellowing of the eyes or skin) low blood pressure (dizziness; feeling faint or lightheaded, falls; unusually weak or tired) muscle cramps pain, redness, or irritation at site where injected pain, tingling, numbness in the hands or feet seizures trouble breathing unusual bruising or bleeding Side effects that usually do not require medical attention (report to your doctor or health care professional if they continue or are bothersome): diarrhea nausea stomach pain trouble sleeping vomiting This list may not describe all possible side effects. Call your doctor for medical advice about side effects. You may report side effects to FDA at 1-800-FDA-1088. Where should I keep my medication? This medicine is given  in a hospital or clinic. It will not be stored at home. NOTE: This sheet is a summary. It may not cover all possible information. If you have questions about this medicine, talk to your doctor, pharmacist, or health care provider.  2023 Elsevier/Gold Standard (2020-12-05 00:00:00)    To help prevent nausea and vomiting after your treatment, we encourage you to take your nausea medication as directed.  BELOW ARE SYMPTOMS THAT SHOULD BE REPORTED IMMEDIATELY: *FEVER GREATER THAN 100.4 F (38 C) OR HIGHER *CHILLS OR SWEATING *NAUSEA AND VOMITING THAT IS NOT CONTROLLED WITH YOUR NAUSEA MEDICATION *UNUSUAL SHORTNESS OF BREATH *UNUSUAL BRUISING OR BLEEDING *URINARY PROBLEMS (pain or burning when urinating, or frequent urination) *BOWEL PROBLEMS (unusual diarrhea, constipation, pain near the anus) TENDERNESS IN MOUTH AND THROAT WITH OR WITHOUT PRESENCE OF ULCERS  (sore throat, sores in mouth, or a toothache) UNUSUAL RASH, SWELLING OR PAIN  UNUSUAL VAGINAL DISCHARGE OR ITCHING   Items with * indicate a potential emergency and should be followed up as soon as possible or go to the Emergency Department if any problems should occur.  Please show the CHEMOTHERAPY ALERT CARD or IMMUNOTHERAPY ALERT CARD at check-in to the Emergency Department and triage nurse.  Should you have questions after your visit or need to cancel or reschedule your appointment, please contact [facility] [phone]  and follow the prompts.  Office hours are 8:00 a.m. to 4:30 p.m. Monday - Friday. Please note that voicemails left after 4:00 p.m. may not be returned until the following business day.  We are closed weekends and major holidays. You have access to a nurse at all times for urgent questions. Please call the main number to the clinic [phone] and follow the prompts.  For any non-urgent questions, you may also contact your provider using MyChart. We now offer e-Visits for anyone 70 and older to request care online for non-urgent symptoms. For details visit [email or web address].   Also download the MyChart app! Go to the app store, search "MyChart", open the app, select [facility], and log in with your MyChart username and password.  Masks are optional in the cancer centers. If you would like for your care team to wear a mask while they are taking care of you, please let them know. For doctor visits, patients may have with them one support person who is at least 82 years old. At this time, visitors are not allowed in the infusion area.

## 2022-06-09 NOTE — Progress Notes (Signed)
[facility] [address] [place] [address]   CLINIC:  Medical Oncology/Hematology  PCP:  [name], MD [address] / [place] [address] [phone]   REASON FOR VISIT:  Follow-up for multiple myeloma  PRIOR THERAPY: none  NGS Results: not done  CURRENT THERAPY: Velcade 3/4 weeks; Revlimid 20 mg 2/3 weeks  BRIEF ONCOLOGIC HISTORY:  Oncology History  Multiple myeloma not having achieved remission ([facility])  01/20/2018 Initial Diagnosis   Multiple myeloma not having achieved remission ([facility])   01/26/2018 -  Chemotherapy   Patient is on Treatment Plan : MYELOMA  RVD SQ (Bortezomib d 1,8,15 ) q28d x 4 cycles       CANCER STAGING:  Cancer Staging  No matching staging information was found for the patient.  INTERVAL HISTORY:  Brittany Archer, a 81 y.o. female, returns for routine follow-up and consideration for next cycle of chemotherapy. Brittany Archer was last seen on 04/13/2022.  Due for cycle #55 of Velcade today.   Overall, she tells me she has been feeling pretty well. She has stopped Revlimid. She denies numbness/tingling, recent falls, and recent infections.   Overall, she feels ready for next cycle of chemo today.    REVIEW OF SYSTEMS:  Review of Systems  Constitutional:  Negative for appetite change and fatigue.  All other systems reviewed and are negative.   PAST MEDICAL/SURGICAL HISTORY:  Past Medical History:  Diagnosis Date   Breast cancer ([facility])    left breast/ 2008/ surg/ rad tx   Coronary artery disease    Diabetes mellitus    Past Surgical History:  Procedure Laterality Date   ABDOMINAL HYSTERECTOMY     BREAST SURGERY     DEBRIDEMENT MANDIBLE N/A 02/22/2020   Procedure: INCISION AND DRAINAGE WITH DEBRIDEMENT MANDIBLE;  Surgeon: Michael Litter, DMD;  Location: [facility];  Service: Oral Surgery;  Laterality: N/A;   DEBRIDEMENT MANDIBLE Right 03/19/2021   Procedure: DEBRIDEMENT OF BONE RIGHT INTERIOR  MANDIBLE;   Surgeon: Michael Litter, DMD;  Location: [facility];  Service: Oral Surgery;  Laterality: Right;   EYE SURGERY  2021   cataract removals    TOOTH EXTRACTION N/A 02/22/2020   Procedure: DENTAL RESTORATION/EXTRACTIONS;  Surgeon: Michael Litter, DMD;  Location: [facility];  Service: Oral Surgery;  Laterality: N/A;  DENTAL KIT REQUESTED    SOCIAL HISTORY:  Social History   Socioeconomic History   Marital status: Divorced    Spouse name: Not on file   Number of children: Not on file   Years of education: Not on file   Highest education level: Not on file  Occupational History   Not on file  Tobacco Use   Smoking status: Never   Smokeless tobacco: Never  Vaping Use   Vaping Use: Never used  Substance and Sexual Activity   Alcohol use: No   Drug use: No   Sexual activity: Yes    Birth control/protection: Surgical  Other Topics Concern   Not on file  Social History Narrative   Not on file   Social Determinants of Health   Financial Resource Strain: Low Risk  (12/09/2020)   Overall Financial Resource Strain (CARDIA)    Difficulty of Paying Living Expenses: Not hard at all  Food Insecurity: No Food Insecurity (12/09/2020)   Hunger Vital Sign    Worried About Running Out of Food in the Last Year: Never true    Ran Out of Food in the Last Year: Never true  Transportation Needs: No Transportation Needs (12/09/2020)   PRAPARE - Hydrologist (Medical): No    Lack of Transportation (Non-Medical): No  Physical Activity: Inactive (12/09/2020)   Exercise Vital Sign    Days of Exercise per Week: 0 days    Minutes of Exercise per Session: 0 min  Stress: No Stress Concern Present (12/09/2020)   [place]    Feeling of Stress : Not at all  Social Connections: Moderately Isolated (12/09/2020)   Social Connection and Isolation Panel [NHANES]    Frequency of Communication with Friends and Family: More than  three times a week    Frequency of Social Gatherings with Friends and Family: More than three times a week    Attends Religious Services: More than 4 times per year    Active Member of [employer] or Organizations: No    Attends Archivist Meetings: Never    Marital Status: Divorced  Human resources officer Violence: Not At Risk (12/09/2020)   Humiliation, Afraid, Rape, and Kick questionnaire    Fear of Current or Ex-Partner: No    Emotionally Abused: No    Physically Abused: No    Sexually Abused: No    FAMILY HISTORY:  Family History  Problem Relation Age of Onset   Obesity Sister     CURRENT MEDICATIONS:  Current Outpatient Medications  Medication Sig Dispense Refill   acyclovir (ZOVIRAX) 400 MG tablet TAKE 1 TABLET BY MOUTH TWICE DAILY (Patient taking differently: Take 400 mg by mouth 2 (two) times daily.) 60 tablet 11   aspirin 81 MG tablet Take 1 tablet (81 mg total) by mouth daily with breakfast. 30 tablet 5   bortezomib IV (VELCADE) 3.5 MG injection Inject 3.5 mg into the vein once a week. weekly     chlorhexidine (PERIDEX) 0.12 % solution Use as directed 15 mLs in the mouth or throat 3 (three) times daily.     cholecalciferol (VITAMIN D) 25 MCG (1000 UNIT) tablet 1 capsule     dexamethasone (DECADRON) 4 MG tablet Take 10 tablets (40 mg) on days 1, 8, and 15 of chemo. Repeat every 21 days. (Patient taking differently: Take 4 mg by mouth See admin instructions. Take 10 tablets (40 mg) on days 1, 8, and 15 of chemo. Repeat every 21 days.) 30 tablet 3   glucose blood (ACCU-CHEK AVIVA PLUS) test strip CHECK BLOOD SUGAR ONCE DAILY     magnesium oxide (MAG-OX) 400 (240 Mg) MG tablet Take 1 tablet (400 mg total) by mouth in the morning, at noon, and at bedtime. 90 tablet 6   mirtazapine (REMERON) 15 MG tablet Take 0.5 tablets (7.5 mg total) by mouth at bedtime. For appetite stimulation 30 tablet 2   ondansetron (ZOFRAN) 8 MG tablet 1 tablet     Potassium Acetate POWD Take 20 mEq by  mouth daily. 12000 g 5   REVLIMID 20 MG capsule Take by mouth.     No current facility-administered medications for this visit.    ALLERGIES:  Allergies  Allergen Reactions   Other Other (See Comments)   Seasonal Ic [Cholestatin] Other (See Comments)    Sneezing, watery eyes   Motrin [Ibuprofen] Rash    PHYSICAL EXAM:  Performance status (ECOG): 1 - Symptomatic but completely ambulatory  There were no vitals filed for this visit. Wt Readings from Last 3 Encounters:  05/26/22 154 lb 1.6 oz (69.9 kg)  05/11/22 149 lb 6.4 oz (67.8  kg)  04/13/22 147 lb 4.8 oz (66.8 kg)   Physical Exam Vitals reviewed.  Constitutional:      Appearance: Normal appearance.  Cardiovascular:     Rate and Rhythm: Normal rate and regular rhythm.     Pulses: Normal pulses.     Heart sounds: Normal heart sounds.  Pulmonary:     Effort: Pulmonary effort is normal.     Breath sounds: Normal breath sounds.  Neurological:     General: No focal deficit present.     Mental Status: She is alert and oriented to person, place, and time.  Psychiatric:        Mood and Affect: Mood normal.        Behavior: Behavior normal.     LABORATORY DATA:  I have reviewed the labs as listed.     Latest Ref Rng & Units 06/02/2022   10:40 AM 05/26/2022   10:02 AM 05/11/2022   11:26 AM  CBC  WBC 4.0 - 10.5 K/uL 4.2  4.2  3.6   Hemoglobin 12.0 - 15.0 g/dL 9.9  10.7  9.9   Hematocrit 36.0 - 46.0 % 31.0  33.4  30.1   Platelets 150 - 400 K/uL 159  165  197       Latest Ref Rng & Units 06/02/2022   10:40 AM 05/26/2022   10:02 AM 05/11/2022   11:26 AM  CMP  Glucose 70 - 99 mg/dL 190  120  130   BUN 8 - 23 mg/dL '14  15  20   ' Creatinine 0.44 - 1.00 mg/dL 1.11  1.01  1.28   Sodium 135 - 145 mmol/L 142  144  138   Potassium 3.5 - 5.1 mmol/L 3.3  2.8  2.1   Chloride 98 - 111 mmol/L 111  110  114   CO2 22 - 32 mmol/L '22  28  20   ' Calcium 8.9 - 10.3 mg/dL 9.2  9.3  9.2   Total Protein 6.5 - 8.1 g/dL 6.1  6.2  6.7   Total  Bilirubin 0.3 - 1.2 mg/dL 0.4  0.7  0.6   Alkaline Phos 38 - 126 U/L 83  93  106   AST 15 - 41 U/L '12  16  11   ' ALT 0 - 44 U/L '11  13  9     ' DIAGNOSTIC IMAGING:  I have independently reviewed the scans and discussed with the patient. No results found.   ASSESSMENT:  1.  IgA kappa plasma cell myeloma, stage I: -RVD started on 01/09/2018, held since 02/14/2020 due to mandible abscess. -Myeloma labs on 05/14/2020 showed progression with M spike of 0.5 g. -RVD started back on 05/21/2020. -Myeloma labs on 07/10/2020 shows M spike improved to 0.3 g from 0.6 g previously.  Free light chain ratio is 1.74 with kappa light chains 28.4. -Myeloma panel from 08/14/2020 shows M spike 0.4 g.  Kappa light chains are 24.8 and ratio is 2.23. - Revlimid (20 mg 2 weeks on/1 week off) and Velcade (3 weeks on/1 week off) were held after 02/26/2022 due to weight loss. - Velcade maintenance every 2 weeks started on 04/13/2022.   2.  Osteomyelitis of the right mandible/dental abscess: -Finished IV ceftriaxone on 04/03/2020.  Finished oral antibiotics. -We will hold Xgeva indefinitely.   PLAN:  1.  IgA kappa plasma cell myeloma, stage I: - Revlimid was discontinued and Velcade maintenance every 2 weeks was started on 04/13/2022. - She is tolerating it very well.  No infections.  No neuropathy symptoms.  No falls. - Reviewed labs from 06/02/2022 which showed normal LFTs and creatinine stable at 1.1.  White count and platelet count is normal. - Myeloma panel shows M spike is 0.4 g with kappa light chains 35.9 and ratio of 2.6.  Although M spike has increased, it has happened before and became undetectable again.  Hence I would not change treatment at this time.  Continue Velcade maintenance every 2 weeks.  RTC 8 weeks for follow-up with repeat myeloma panel.   2.  Severe hypokalemia: - Continue potassium 3 times daily at home.  Potassium today is 3.3.   3.  Osteomyelitis of the right mandible/dental abscess: -  Bisphosphonates on hold indefinitely due to ONJ.   4.  Hypomagnesemia: - Continue magnesium 3 times daily.  Magnesium today is low at 1.5.   5.  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  Hemoglobin is 9.9.  Ferritin is 136 and percent saturation 46.  Continue Retacrit every 2 weeks as needed.  We will recheck iron panel at next visit.   Orders placed this encounter:  No orders of the defined types were placed in this encounter.    Derek Jack, MD [facility] [phone]   I, Thana Ates, am acting as a scribe for Dr. Derek Jack.  I, Derek Jack MD, have reviewed the above documentation for accuracy and completeness, and I agree with the above.

## 2022-06-12 ENCOUNTER — Other Ambulatory Visit (HOSPITAL_COMMUNITY): Payer: Self-pay | Admitting: Hematology

## 2022-06-23 ENCOUNTER — Inpatient Hospital Stay (HOSPITAL_COMMUNITY): Payer: Medicare Other

## 2022-06-23 VITALS — BP 132/65 | HR 75 | Temp 98.1°F | Resp 18 | Wt 152.9 lb

## 2022-06-23 DIAGNOSIS — Z5112 Encounter for antineoplastic immunotherapy: Secondary | ICD-10-CM | POA: Diagnosis not present

## 2022-06-23 DIAGNOSIS — E538 Deficiency of other specified B group vitamins: Secondary | ICD-10-CM

## 2022-06-23 DIAGNOSIS — C9000 Multiple myeloma not having achieved remission: Secondary | ICD-10-CM

## 2022-06-23 LAB — COMPREHENSIVE METABOLIC PANEL
ALT: 11 U/L (ref 0–44)
AST: 19 U/L (ref 15–41)
Albumin: 3.3 g/dL — ABNORMAL LOW (ref 3.5–5.0)
Alkaline Phosphatase: 105 U/L (ref 38–126)
Anion gap: 10 (ref 5–15)
BUN: 13 mg/dL (ref 8–23)
CO2: 24 mmol/L (ref 22–32)
Calcium: 9.2 mg/dL (ref 8.9–10.3)
Chloride: 107 mmol/L (ref 98–111)
Creatinine, Ser: 1.17 mg/dL — ABNORMAL HIGH (ref 0.44–1.00)
GFR, Estimated: 47 mL/min — ABNORMAL LOW (ref >60)
Glucose, Bld: 219 mg/dL — ABNORMAL HIGH (ref 70–99)
Potassium: 2.2 mmol/L — CL (ref 3.5–5.1)
Sodium: 141 mmol/L (ref 135–145)
Total Bilirubin: 0.4 mg/dL (ref 0.3–1.2)
Total Protein: 6.8 g/dL (ref 6.5–8.1)

## 2022-06-23 LAB — CBC WITH DIFFERENTIAL/PLATELET
Abs Immature Granulocytes: 0.03 10*3/uL (ref 0.00–0.07)
Basophils Absolute: 0 10*3/uL (ref 0.0–0.1)
Basophils Relative: 0 %
Eosinophils Absolute: 0.1 10*3/uL (ref 0.0–0.5)
Eosinophils Relative: 1 %
HCT: 32.3 % — ABNORMAL LOW (ref 36.0–46.0)
Hemoglobin: 10.6 g/dL — ABNORMAL LOW (ref 12.0–15.0)
Immature Granulocytes: 0 %
Lymphocytes Relative: 11 %
Lymphs Abs: 0.9 10*3/uL (ref 0.7–4.0)
MCH: 33.2 pg (ref 26.0–34.0)
MCHC: 32.8 g/dL (ref 30.0–36.0)
MCV: 101.3 fL — ABNORMAL HIGH (ref 80.0–100.0)
Monocytes Absolute: 0.5 10*3/uL (ref 0.1–1.0)
Monocytes Relative: 6 %
Neutro Abs: 6.5 10*3/uL (ref 1.7–7.7)
Neutrophils Relative %: 82 %
Platelets: 253 10*3/uL (ref 150–400)
RBC: 3.19 MIL/uL — ABNORMAL LOW (ref 3.87–5.11)
RDW: 14.5 % (ref 11.5–15.5)
WBC: 7.9 10*3/uL (ref 4.0–10.5)
nRBC: 0 % (ref 0.0–0.2)

## 2022-06-23 LAB — MAGNESIUM: Magnesium: 1.6 mg/dL — ABNORMAL LOW (ref 1.7–2.4)

## 2022-06-23 MED ORDER — BORTEZOMIB CHEMO SQ INJECTION 3.5 MG (2.5MG/ML)
1.3000 mg/m2 | Freq: Once | INTRAMUSCULAR | Status: AC
  Administered 2022-06-23: 2.25 mg via SUBCUTANEOUS
  Filled 2022-06-23: qty 0.9

## 2022-06-23 MED ORDER — DEXAMETHASONE 4 MG PO TABS
20.0000 mg | ORAL_TABLET | Freq: Once | ORAL | Status: AC
  Administered 2022-06-23: 20 mg via ORAL
  Filled 2022-06-23: qty 5

## 2022-06-23 MED ORDER — POTASSIUM CHLORIDE 10 MEQ/100ML IV SOLN
10.0000 meq | INTRAVENOUS | Status: AC
  Administered 2022-06-23 (×2): 10 meq via INTRAVENOUS
  Filled 2022-06-23 (×2): qty 100

## 2022-06-23 MED ORDER — EPOETIN ALFA-EPBX 20000 UNIT/ML IJ SOLN: 20000.0000 [IU] | Freq: Once | INTRAMUSCULAR | Status: DC

## 2022-06-23 MED ORDER — MAGNESIUM SULFATE 2 GM/50ML IV SOLN
2.0000 g | INTRAVENOUS | Status: AC
  Administered 2022-06-23: 2 g via INTRAVENOUS
  Filled 2022-06-23: qty 50

## 2022-06-23 MED ORDER — PROCHLORPERAZINE MALEATE 10 MG PO TABS
10.0000 mg | ORAL_TABLET | Freq: Once | ORAL | Status: AC
  Administered 2022-06-23: 10 mg via ORAL
  Filled 2022-06-23: qty 1

## 2022-06-23 MED ORDER — SODIUM CHLORIDE 0.9 % IV SOLN: Freq: Once | INTRAVENOUS | Status: AC

## 2022-06-23 MED ORDER — POTASSIUM CHLORIDE CRYS ER 20 MEQ PO TBCR
40.0000 meq | EXTENDED_RELEASE_TABLET | ORAL | Status: AC
  Administered 2022-06-23 (×2): 40 meq via ORAL
  Filled 2022-06-23 (×2): qty 2

## 2022-06-23 MED ORDER — CYANOCOBALAMIN 1000 MCG/ML IJ SOLN
1000.0000 ug | Freq: Once | INTRAMUSCULAR | Status: AC
  Administered 2022-06-23: 1000 ug via INTRAMUSCULAR
  Filled 2022-06-23: qty 1

## 2022-07-07 ENCOUNTER — Inpatient Hospital Stay (HOSPITAL_COMMUNITY): Payer: Medicare Other

## 2022-07-07 ENCOUNTER — Inpatient Hospital Stay (HOSPITAL_COMMUNITY): Payer: Medicare Other | Attending: Hematology

## 2022-07-07 VITALS — BP 139/67 | HR 81 | Temp 98.9°F | Resp 18

## 2022-07-07 DIAGNOSIS — D539 Nutritional anemia, unspecified: Secondary | ICD-10-CM | POA: Insufficient documentation

## 2022-07-07 DIAGNOSIS — Z5112 Encounter for antineoplastic immunotherapy: Secondary | ICD-10-CM | POA: Insufficient documentation

## 2022-07-07 DIAGNOSIS — E876 Hypokalemia: Secondary | ICD-10-CM | POA: Diagnosis not present

## 2022-07-07 DIAGNOSIS — C9000 Multiple myeloma not having achieved remission: Secondary | ICD-10-CM

## 2022-07-07 DIAGNOSIS — E538 Deficiency of other specified B group vitamins: Secondary | ICD-10-CM

## 2022-07-07 LAB — CBC WITH DIFFERENTIAL/PLATELET
Abs Immature Granulocytes: 0.05 10*3/uL (ref 0.00–0.07)
Basophils Absolute: 0 10*3/uL (ref 0.0–0.1)
Basophils Relative: 0 %
Eosinophils Absolute: 0 10*3/uL (ref 0.0–0.5)
Eosinophils Relative: 0 %
HCT: 30.1 % — ABNORMAL LOW (ref 36.0–46.0)
Hemoglobin: 9.7 g/dL — ABNORMAL LOW (ref 12.0–15.0)
Immature Granulocytes: 0 %
Lymphocytes Relative: 8 %
Lymphs Abs: 1 10*3/uL (ref 0.7–4.0)
MCH: 32.8 pg (ref 26.0–34.0)
MCHC: 32.2 g/dL (ref 30.0–36.0)
MCV: 101.7 fL — ABNORMAL HIGH (ref 80.0–100.0)
Monocytes Absolute: 0.7 10*3/uL (ref 0.1–1.0)
Monocytes Relative: 6 %
Neutro Abs: 10.7 10*3/uL — ABNORMAL HIGH (ref 1.7–7.7)
Neutrophils Relative %: 86 %
Platelets: 173 10*3/uL (ref 150–400)
RBC: 2.96 MIL/uL — ABNORMAL LOW (ref 3.87–5.11)
RDW: 14.9 % (ref 11.5–15.5)
WBC: 12.5 10*3/uL — ABNORMAL HIGH (ref 4.0–10.5)
nRBC: 0 % (ref 0.0–0.2)

## 2022-07-07 LAB — COMPREHENSIVE METABOLIC PANEL
ALT: 11 U/L (ref 0–44)
AST: 9 U/L — ABNORMAL LOW (ref 15–41)
Albumin: 3 g/dL — ABNORMAL LOW (ref 3.5–5.0)
Alkaline Phosphatase: 89 U/L (ref 38–126)
Anion gap: 7 (ref 5–15)
BUN: 14 mg/dL (ref 8–23)
CO2: 26 mmol/L (ref 22–32)
Calcium: 8.8 mg/dL — ABNORMAL LOW (ref 8.9–10.3)
Chloride: 106 mmol/L (ref 98–111)
Creatinine, Ser: 1.03 mg/dL — ABNORMAL HIGH (ref 0.44–1.00)
GFR, Estimated: 54 mL/min — ABNORMAL LOW (ref >60)
Glucose, Bld: 189 mg/dL — ABNORMAL HIGH (ref 70–99)
Potassium: 2.7 mmol/L — CL (ref 3.5–5.1)
Sodium: 139 mmol/L (ref 135–145)
Total Bilirubin: 1 mg/dL (ref 0.3–1.2)
Total Protein: 6.4 g/dL — ABNORMAL LOW (ref 6.5–8.1)

## 2022-07-07 LAB — MAGNESIUM: Magnesium: 1.6 mg/dL — ABNORMAL LOW (ref 1.7–2.4)

## 2022-07-07 MED ORDER — SODIUM CHLORIDE 0.9 % IV SOLN: INTRAVENOUS | Status: DC

## 2022-07-07 MED ORDER — PROCHLORPERAZINE MALEATE 10 MG PO TABS
10.0000 mg | ORAL_TABLET | Freq: Once | ORAL | Status: AC
  Administered 2022-07-07: 10 mg via ORAL
  Filled 2022-07-07: qty 1

## 2022-07-07 MED ORDER — BORTEZOMIB CHEMO SQ INJECTION 3.5 MG (2.5MG/ML)
1.3000 mg/m2 | Freq: Once | INTRAMUSCULAR | Status: AC
  Administered 2022-07-07: 2.25 mg via SUBCUTANEOUS
  Filled 2022-07-07: qty 0.9

## 2022-07-07 MED ORDER — EPOETIN ALFA-EPBX 10000 UNIT/ML IJ SOLN
20000.0000 [IU] | Freq: Once | INTRAMUSCULAR | Status: AC
  Administered 2022-07-07: 20000 [IU] via SUBCUTANEOUS
  Filled 2022-07-07: qty 2

## 2022-07-07 MED ORDER — POTASSIUM CHLORIDE 10 MEQ/100ML IV SOLN
10.0000 meq | INTRAVENOUS | Status: AC
  Administered 2022-07-07 (×2): 10 meq via INTRAVENOUS
  Filled 2022-07-07 (×2): qty 100

## 2022-07-07 MED ORDER — MAGNESIUM SULFATE 2 GM/50ML IV SOLN
2.0000 g | Freq: Once | INTRAVENOUS | Status: AC
  Administered 2022-07-07: 2 g via INTRAVENOUS
  Filled 2022-07-07: qty 50

## 2022-07-07 MED ORDER — POTASSIUM CHLORIDE CRYS ER 20 MEQ PO TBCR
40.0000 meq | EXTENDED_RELEASE_TABLET | ORAL | Status: AC
  Administered 2022-07-07 (×2): 40 meq via ORAL
  Filled 2022-07-07 (×2): qty 2

## 2022-07-07 MED ORDER — DEXAMETHASONE 4 MG PO TABS
20.0000 mg | ORAL_TABLET | Freq: Once | ORAL | Status: AC
  Administered 2022-07-07: 20 mg via ORAL
  Filled 2022-07-07: qty 5

## 2022-07-07 NOTE — Patient Instructions (Signed)
[facility]  Discharge Instructions: Thank you for choosing [facility] to provide your oncology and hematology care.  If you have a lab appointment with the [facility], please come in thru the Main Entrance and check in at the main information desk.  Wear comfortable clothing and clothing appropriate for easy access to any Portacath or PICC line.   We strive to give you quality time with your provider. You may need to reschedule your appointment if you arrive late (15 or more minutes).  Arriving late affects you and other patients whose appointments are after yours.  Also, if you miss three or more appointments without notifying the office, you may be dismissed from the clinic at the provider's discretion.      For prescription refill requests, have your pharmacy contact our office and allow 72 hours for refills to be completed.    Today you received the following chemotherapy and/or immunotherapy agents Velcade Retacrit      To help prevent nausea and vomiting after your treatment, we encourage you to take your nausea medication as directed.  BELOW ARE SYMPTOMS THAT SHOULD BE REPORTED IMMEDIATELY: *FEVER GREATER THAN 100.4 F (38 C) OR HIGHER *CHILLS OR SWEATING *NAUSEA AND VOMITING THAT IS NOT CONTROLLED WITH YOUR NAUSEA MEDICATION *UNUSUAL SHORTNESS OF BREATH *UNUSUAL BRUISING OR BLEEDING *URINARY PROBLEMS (pain or burning when urinating, or frequent urination) *BOWEL PROBLEMS (unusual diarrhea, constipation, pain near the anus) TENDERNESS IN MOUTH AND THROAT WITH OR WITHOUT PRESENCE OF ULCERS (sore throat, sores in mouth, or a toothache) UNUSUAL RASH, SWELLING OR PAIN  UNUSUAL VAGINAL DISCHARGE OR ITCHING   Items with * indicate a potential emergency and should be followed up as soon as possible or go to the Emergency Department if any problems should occur.  Please show the CHEMOTHERAPY ALERT CARD or IMMUNOTHERAPY ALERT CARD at check-in to the Emergency  Department and triage nurse.  Should you have questions after your visit or need to cancel or reschedule your appointment, please contact [facility] [phone]  and follow the prompts.  Office hours are 8:00 a.m. to 4:30 p.m. Monday - Friday. Please note that voicemails left after 4:00 p.m. may not be returned until the following business day.  We are closed weekends and major holidays. You have access to a nurse at all times for urgent questions. Please call the main number to the clinic [phone] and follow the prompts.  For any non-urgent questions, you may also contact your provider using MyChart. We now offer e-Visits for anyone 40 and older to request care online for non-urgent symptoms. For details visit [email or web address].   Also download the MyChart app! Go to the app store, search "MyChart", open the app, select [facility], and log in with your MyChart username and password.  Masks are optional in the cancer centers. If you would like for your care team to wear a mask while they are taking care of you, please let them know. For doctor visits, patients may have with them one support person who is at least 82 years old. At this time, visitors are not allowed in the infusion area.

## 2022-07-07 NOTE — Progress Notes (Signed)
Patient presents today for Retacrit and Velcade injections per providers order.  Vital signs WNL.  Labs pending.  Patient has no new complaints at this time.  Patient also receiving 63mq PO potassium, 224m IV potassium, and another 4018mPO potassium as well as 2 grams of IV magnesium.  Peripheral IV started and blood return noted pre and post infusion.  Retacrit and Velcade administration without incident; injection site WNL; see MAR for injection details.  Patient tolerated procedure well and without incident.     Discharge from clinic ambulatory in stable condition.  Alert and oriented X 3.  [facility]

## 2022-07-09 ENCOUNTER — Other Ambulatory Visit (HOSPITAL_COMMUNITY): Payer: Self-pay | Admitting: Hematology

## 2022-07-20 ENCOUNTER — Other Ambulatory Visit: Payer: Self-pay

## 2022-07-21 ENCOUNTER — Inpatient Hospital Stay (HOSPITAL_COMMUNITY): Payer: Medicare Other

## 2022-07-21 ENCOUNTER — Encounter (HOSPITAL_COMMUNITY): Payer: Self-pay

## 2022-07-21 VITALS — BP 184/66 | HR 60 | Temp 98.5°F | Resp 16 | Wt 163.0 lb

## 2022-07-21 DIAGNOSIS — C9000 Multiple myeloma not having achieved remission: Secondary | ICD-10-CM

## 2022-07-21 DIAGNOSIS — E538 Deficiency of other specified B group vitamins: Secondary | ICD-10-CM

## 2022-07-21 DIAGNOSIS — Z5112 Encounter for antineoplastic immunotherapy: Secondary | ICD-10-CM | POA: Diagnosis not present

## 2022-07-21 DIAGNOSIS — E876 Hypokalemia: Secondary | ICD-10-CM

## 2022-07-21 DIAGNOSIS — D649 Anemia, unspecified: Secondary | ICD-10-CM

## 2022-07-21 DIAGNOSIS — D513 Other dietary vitamin B12 deficiency anemia: Secondary | ICD-10-CM

## 2022-07-21 LAB — COMPREHENSIVE METABOLIC PANEL
ALT: 6 U/L (ref 0–44)
AST: 12 U/L — ABNORMAL LOW (ref 15–41)
Albumin: 3.2 g/dL — ABNORMAL LOW (ref 3.5–5.0)
Alkaline Phosphatase: 84 U/L (ref 38–126)
Anion gap: 5 (ref 5–15)
BUN: 8 mg/dL (ref 8–23)
CO2: 24 mmol/L (ref 22–32)
Calcium: 8.5 mg/dL — ABNORMAL LOW (ref 8.9–10.3)
Chloride: 114 mmol/L — ABNORMAL HIGH (ref 98–111)
Creatinine, Ser: 1 mg/dL (ref 0.44–1.00)
GFR, Estimated: 56 mL/min — ABNORMAL LOW (ref >60)
Glucose, Bld: 124 mg/dL — ABNORMAL HIGH (ref 70–99)
Potassium: 2.7 mmol/L — CL (ref 3.5–5.1)
Sodium: 143 mmol/L (ref 135–145)
Total Bilirubin: 0.6 mg/dL (ref 0.3–1.2)
Total Protein: 6.1 g/dL — ABNORMAL LOW (ref 6.5–8.1)

## 2022-07-21 LAB — CBC WITH DIFFERENTIAL/PLATELET
Abs Immature Granulocytes: 0.03 10*3/uL (ref 0.00–0.07)
Basophils Absolute: 0 10*3/uL (ref 0.0–0.1)
Basophils Relative: 1 %
Eosinophils Absolute: 0.1 10*3/uL (ref 0.0–0.5)
Eosinophils Relative: 1 %
HCT: 20.8 % — ABNORMAL LOW (ref 36.0–46.0)
Hemoglobin: 6.6 g/dL — CL (ref 12.0–15.0)
Immature Granulocytes: 1 %
Lymphocytes Relative: 23 %
Lymphs Abs: 1 10*3/uL (ref 0.7–4.0)
MCH: 32.7 pg (ref 26.0–34.0)
MCHC: 31.7 g/dL (ref 30.0–36.0)
MCV: 103 fL — ABNORMAL HIGH (ref 80.0–100.0)
Monocytes Absolute: 0.3 10*3/uL (ref 0.1–1.0)
Monocytes Relative: 8 %
Neutro Abs: 2.7 10*3/uL (ref 1.7–7.7)
Neutrophils Relative %: 66 %
Platelets: 186 10*3/uL (ref 150–400)
RBC: 2.02 MIL/uL — ABNORMAL LOW (ref 3.87–5.11)
RDW: 15.4 % (ref 11.5–15.5)
WBC: 4.1 10*3/uL (ref 4.0–10.5)
nRBC: 0.7 % — ABNORMAL HIGH (ref 0.0–0.2)

## 2022-07-21 LAB — FERRITIN: Ferritin: 138 ng/mL (ref 11–307)

## 2022-07-21 LAB — MAGNESIUM: Magnesium: 1.5 mg/dL — ABNORMAL LOW (ref 1.7–2.4)

## 2022-07-21 LAB — PREPARE RBC (CROSSMATCH)

## 2022-07-21 LAB — IRON AND TIBC
Iron: 44 ug/dL (ref 28–170)
Saturation Ratios: 39 % — ABNORMAL HIGH (ref 10.4–31.8)
TIBC: 112 ug/dL — ABNORMAL LOW (ref 250–450)
UIBC: 68 ug/dL

## 2022-07-21 LAB — LACTATE DEHYDROGENASE: LDH: 139 U/L (ref 98–192)

## 2022-07-21 LAB — VITAMIN B12: Vitamin B-12: 654 pg/mL (ref 180–914)

## 2022-07-21 MED ORDER — SODIUM CHLORIDE 0.9 % IV SOLN: Freq: Once | INTRAVENOUS | Status: AC

## 2022-07-21 MED ORDER — DEXAMETHASONE 4 MG PO TABS
20.0000 mg | ORAL_TABLET | Freq: Once | ORAL | Status: AC
  Administered 2022-07-21: 20 mg via ORAL
  Filled 2022-07-21: qty 5

## 2022-07-21 MED ORDER — POTASSIUM CHLORIDE 10 MEQ/100ML IV SOLN
10.0000 meq | INTRAVENOUS | Status: AC
  Administered 2022-07-21 (×2): 10 meq via INTRAVENOUS
  Filled 2022-07-21 (×2): qty 100

## 2022-07-21 MED ORDER — EPOETIN ALFA-EPBX 20000 UNIT/ML IJ SOLN
20000.0000 [IU] | Freq: Once | INTRAMUSCULAR | Status: AC
  Administered 2022-07-21: 20000 [IU] via SUBCUTANEOUS
  Filled 2022-07-21: qty 1

## 2022-07-21 MED ORDER — CYANOCOBALAMIN 1000 MCG/ML IJ SOLN
1000.0000 ug | Freq: Once | INTRAMUSCULAR | Status: AC
  Administered 2022-07-21: 1000 ug via INTRAMUSCULAR
  Filled 2022-07-21: qty 1

## 2022-07-21 MED ORDER — SODIUM CHLORIDE 0.9% IV SOLUTION
250.0000 mL | Freq: Once | INTRAVENOUS | Status: AC
  Administered 2022-07-21: 250 mL via INTRAVENOUS

## 2022-07-21 MED ORDER — BORTEZOMIB CHEMO SQ INJECTION 3.5 MG (2.5MG/ML)
1.3000 mg/m2 | Freq: Once | INTRAMUSCULAR | Status: AC
  Administered 2022-07-21: 2.25 mg via SUBCUTANEOUS
  Filled 2022-07-21: qty 0.9

## 2022-07-21 MED ORDER — POTASSIUM CHLORIDE CRYS ER 20 MEQ PO TBCR
40.0000 meq | EXTENDED_RELEASE_TABLET | ORAL | Status: AC
  Administered 2022-07-21 (×2): 40 meq via ORAL
  Filled 2022-07-21 (×2): qty 2

## 2022-07-21 MED ORDER — PROCHLORPERAZINE MALEATE 10 MG PO TABS
10.0000 mg | ORAL_TABLET | Freq: Once | ORAL | Status: AC
  Administered 2022-07-21: 10 mg via ORAL
  Filled 2022-07-21: qty 1

## 2022-07-21 MED ORDER — ACETAMINOPHEN 325 MG PO TABS
650.0000 mg | ORAL_TABLET | Freq: Once | ORAL | Status: AC
  Administered 2022-07-21: 650 mg via ORAL
  Filled 2022-07-21: qty 2

## 2022-07-21 MED ORDER — MAGNESIUM SULFATE 2 GM/50ML IV SOLN
2.0000 g | INTRAVENOUS | Status: AC
  Administered 2022-07-21: 2 g via INTRAVENOUS
  Filled 2022-07-21: qty 50

## 2022-07-21 MED ORDER — DIPHENHYDRAMINE HCL 25 MG PO CAPS
25.0000 mg | ORAL_CAPSULE | Freq: Once | ORAL | Status: AC
  Administered 2022-07-21: 25 mg via ORAL
  Filled 2022-07-21: qty 1

## 2022-07-21 NOTE — Progress Notes (Signed)
Labs reviewed , ok to treat per parameters. Bilateral lower extremity edema noted today.   Spoke with niece today, she stated they found blood on pts undergarments at home. MD notified and is aware. Will continue to monitor labs   B12, Retacrit, one unit of blood and Treatment given per orders. Patient tolerated it well without problems. Vitals stable and discharged home from clinic ambulatory. Follow up as scheduled.

## 2022-07-21 NOTE — Progress Notes (Signed)
CRITICAL VALUE ALERT Critical value received:  HGB 6.6, Potassium 2.7. Date of notification:  07-21-2022 Time of notification: 08:32 and 08:36 am.  Critical value read back:  Yes.   Nurse who received alert:  Bpresnell RN MD notified time and response:  Dr. Delton Coombes '@08'$ :37 am. Orders received. 1 unit of blood to be administered today. 40 mEq potassium PO x 2 doses pre and post 20 mEq IV potassium.

## 2022-07-21 NOTE — Patient Instructions (Signed)
[facility]  Discharge Instructions: Thank you for choosing [facility] to provide your oncology and hematology care.  If you have a lab appointment with the [facility], please come in thru the Main Entrance and check in at the main information desk.  Wear comfortable clothing and clothing appropriate for easy access to any Portacath or PICC line.   We strive to give you quality time with your provider. You may need to reschedule your appointment if you arrive late (15 or more minutes).  Arriving late affects you and other patients whose appointments are after yours.  Also, if you miss three or more appointments without notifying the office, you may be dismissed from the clinic at the provider's discretion.      For prescription refill requests, have your pharmacy contact our office and allow 72 hours for refills to be completed.    Today you received the following chemotherapy and/or immunotherapy agents, velcade, B12 , retacrit, one unit of blood, K+ supplements.       To help prevent nausea and vomiting after your treatment, we encourage you to take your nausea medication as directed.  BELOW ARE SYMPTOMS THAT SHOULD BE REPORTED IMMEDIATELY: *FEVER GREATER THAN 100.4 F (38 C) OR HIGHER *CHILLS OR SWEATING *NAUSEA AND VOMITING THAT IS NOT CONTROLLED WITH YOUR NAUSEA MEDICATION *UNUSUAL SHORTNESS OF BREATH *UNUSUAL BRUISING OR BLEEDING *URINARY PROBLEMS (pain or burning when urinating, or frequent urination) *BOWEL PROBLEMS (unusual diarrhea, constipation, pain near the anus) TENDERNESS IN MOUTH AND THROAT WITH OR WITHOUT PRESENCE OF ULCERS (sore throat, sores in mouth, or a toothache) UNUSUAL RASH, SWELLING OR PAIN  UNUSUAL VAGINAL DISCHARGE OR ITCHING   Items with * indicate a potential emergency and should be followed up as soon as possible or go to the Emergency Department if any problems should occur.  Please show the CHEMOTHERAPY ALERT CARD or  IMMUNOTHERAPY ALERT CARD at check-in to the Emergency Department and triage nurse.  Should you have questions after your visit or need to cancel or reschedule your appointment, please contact [facility] [phone]  and follow the prompts.  Office hours are 8:00 a.m. to 4:30 p.m. Monday - Friday. Please note that voicemails left after 4:00 p.m. may not be returned until the following business day.  We are closed weekends and major holidays. You have access to a nurse at all times for urgent questions. Please call the main number to the clinic [phone] and follow the prompts.  For any non-urgent questions, you may also contact your provider using MyChart. We now offer e-Visits for anyone 48 and older to request care online for non-urgent symptoms. For details visit [email or web address].   Also download the MyChart app! Go to the app store, search "MyChart", open the app, select [facility], and log in with your MyChart username and password.  Masks are optional in the cancer centers. If you would like for your care team to wear a mask while they are taking care of you, please let them know. For doctor visits, patients may have with them one support person who is at least 82 years old. At this time, visitors are not allowed in the infusion area.

## 2022-07-22 LAB — BPAM RBC
Blood Product Expiration Date: 202308282359
ISSUE DATE / TIME: 202307251127
Unit Type and Rh: 5100

## 2022-07-22 LAB — TYPE AND SCREEN
ABO/RH(D): O POS
Antibody Screen: NEGATIVE
Unit division: 0

## 2022-07-22 LAB — KAPPA/LAMBDA LIGHT CHAINS
Kappa free light chain: 48.6 mg/L — ABNORMAL HIGH (ref 3.3–19.4)
Kappa, lambda light chain ratio: 3.02 — ABNORMAL HIGH (ref 0.26–1.65)
Lambda free light chains: 16.1 mg/L (ref 5.7–26.3)

## 2022-07-24 ENCOUNTER — Other Ambulatory Visit: Payer: Self-pay

## 2022-07-27 LAB — PROTEIN ELECTROPHORESIS, SERUM
A/G Ratio: 1.2 (ref 0.7–1.7)
Albumin ELP: 2.9 g/dL (ref 2.9–4.4)
Alpha-1-Globulin: 0.2 g/dL (ref 0.0–0.4)
Alpha-2-Globulin: 0.6 g/dL (ref 0.4–1.0)
Beta Globulin: 0.9 g/dL (ref 0.7–1.3)
Gamma Globulin: 0.7 g/dL (ref 0.4–1.8)
Globulin, Total: 2.4 g/dL (ref 2.2–3.9)
Total Protein ELP: 5.3 g/dL — ABNORMAL LOW (ref 6.0–8.5)

## 2022-08-04 ENCOUNTER — Inpatient Hospital Stay: Payer: Medicare Other | Attending: Hematology | Admitting: Hematology

## 2022-08-04 ENCOUNTER — Inpatient Hospital Stay: Payer: Medicare Other

## 2022-08-04 ENCOUNTER — Other Ambulatory Visit: Payer: Self-pay

## 2022-08-04 VITALS — BP 170/69 | HR 60 | Temp 97.0°F | Resp 18

## 2022-08-04 VITALS — BP 169/69 | HR 60 | Temp 97.4°F | Resp 18 | Ht 63.5 in | Wt 164.2 lb

## 2022-08-04 DIAGNOSIS — N1832 Chronic kidney disease, stage 3b: Secondary | ICD-10-CM | POA: Insufficient documentation

## 2022-08-04 DIAGNOSIS — C9000 Multiple myeloma not having achieved remission: Secondary | ICD-10-CM

## 2022-08-04 DIAGNOSIS — D631 Anemia in chronic kidney disease: Secondary | ICD-10-CM | POA: Insufficient documentation

## 2022-08-04 DIAGNOSIS — D649 Anemia, unspecified: Secondary | ICD-10-CM

## 2022-08-04 DIAGNOSIS — Z5112 Encounter for antineoplastic immunotherapy: Secondary | ICD-10-CM | POA: Diagnosis present

## 2022-08-04 DIAGNOSIS — E538 Deficiency of other specified B group vitamins: Secondary | ICD-10-CM

## 2022-08-04 LAB — CBC WITH DIFFERENTIAL/PLATELET
Abs Immature Granulocytes: 0.01 10*3/uL (ref 0.00–0.07)
Basophils Absolute: 0 10*3/uL (ref 0.0–0.1)
Basophils Relative: 1 %
Eosinophils Absolute: 0.1 10*3/uL (ref 0.0–0.5)
Eosinophils Relative: 1 %
HCT: 25.2 % — ABNORMAL LOW (ref 36.0–46.0)
Hemoglobin: 8.2 g/dL — ABNORMAL LOW (ref 12.0–15.0)
Immature Granulocytes: 0 %
Lymphocytes Relative: 22 %
Lymphs Abs: 0.9 10*3/uL (ref 0.7–4.0)
MCH: 33.2 pg (ref 26.0–34.0)
MCHC: 32.5 g/dL (ref 30.0–36.0)
MCV: 102 fL — ABNORMAL HIGH (ref 80.0–100.0)
Monocytes Absolute: 0.3 10*3/uL (ref 0.1–1.0)
Monocytes Relative: 9 %
Neutro Abs: 2.6 10*3/uL (ref 1.7–7.7)
Neutrophils Relative %: 67 %
Platelets: 181 10*3/uL (ref 150–400)
RBC: 2.47 MIL/uL — ABNORMAL LOW (ref 3.87–5.11)
RDW: 16.7 % — ABNORMAL HIGH (ref 11.5–15.5)
WBC: 3.9 10*3/uL — ABNORMAL LOW (ref 4.0–10.5)
nRBC: 0 % (ref 0.0–0.2)

## 2022-08-04 LAB — COMPREHENSIVE METABOLIC PANEL
ALT: 9 U/L (ref 0–44)
AST: 11 U/L — ABNORMAL LOW (ref 15–41)
Albumin: 3.3 g/dL — ABNORMAL LOW (ref 3.5–5.0)
Alkaline Phosphatase: 77 U/L (ref 38–126)
Anion gap: 5 (ref 5–15)
BUN: 15 mg/dL (ref 8–23)
CO2: 22 mmol/L (ref 22–32)
Calcium: 8.7 mg/dL — ABNORMAL LOW (ref 8.9–10.3)
Chloride: 118 mmol/L — ABNORMAL HIGH (ref 98–111)
Creatinine, Ser: 0.95 mg/dL (ref 0.44–1.00)
GFR, Estimated: 60 mL/min — ABNORMAL LOW (ref >60)
Glucose, Bld: 106 mg/dL — ABNORMAL HIGH (ref 70–99)
Potassium: 3.2 mmol/L — ABNORMAL LOW (ref 3.5–5.1)
Sodium: 145 mmol/L (ref 135–145)
Total Bilirubin: 0.7 mg/dL (ref 0.3–1.2)
Total Protein: 6.1 g/dL — ABNORMAL LOW (ref 6.5–8.1)

## 2022-08-04 LAB — MAGNESIUM: Magnesium: 1.5 mg/dL — ABNORMAL LOW (ref 1.7–2.4)

## 2022-08-04 LAB — SAMPLE TO BLOOD BANK

## 2022-08-04 MED ORDER — MAGNESIUM SULFATE 2 GM/50ML IV SOLN
2.0000 g | INTRAVENOUS | Status: AC
  Administered 2022-08-04: 2 g via INTRAVENOUS
  Filled 2022-08-04: qty 50

## 2022-08-04 MED ORDER — PROCHLORPERAZINE MALEATE 10 MG PO TABS
10.0000 mg | ORAL_TABLET | Freq: Once | ORAL | Status: AC
  Administered 2022-08-04: 10 mg via ORAL
  Filled 2022-08-04: qty 1

## 2022-08-04 MED ORDER — SODIUM CHLORIDE 0.9 % IV SOLN: Freq: Once | INTRAVENOUS | Status: AC

## 2022-08-04 MED ORDER — DEXAMETHASONE 4 MG PO TABS
20.0000 mg | ORAL_TABLET | Freq: Once | ORAL | Status: AC
  Administered 2022-08-04: 20 mg via ORAL
  Filled 2022-08-04: qty 5

## 2022-08-04 MED ORDER — EPOETIN ALFA-EPBX 20000 UNIT/ML IJ SOLN
20000.0000 [IU] | Freq: Once | INTRAMUSCULAR | Status: AC
  Administered 2022-08-04: 20000 [IU] via SUBCUTANEOUS
  Filled 2022-08-04: qty 1

## 2022-08-04 MED ORDER — BORTEZOMIB CHEMO SQ INJECTION 3.5 MG (2.5MG/ML)
1.3000 mg/m2 | Freq: Once | INTRAMUSCULAR | Status: AC
  Administered 2022-08-04: 2.25 mg via SUBCUTANEOUS
  Filled 2022-08-04: qty 0.9

## 2022-08-04 MED ORDER — POTASSIUM CHLORIDE CRYS ER 20 MEQ PO TBCR
40.0000 meq | EXTENDED_RELEASE_TABLET | Freq: Two times a day (BID) | ORAL | Status: DC
  Administered 2022-08-04: 40 meq via ORAL
  Filled 2022-08-04: qty 2

## 2022-08-04 NOTE — Progress Notes (Signed)
[name] [address] [place] [address]   CLINIC:  Medical Oncology/Hematology  PCP:  [name], MD [address] / [place] [address] [phone]   REASON FOR VISIT:  Follow-up for multiple myeloma  PRIOR THERAPY: none  NGS Results: not done  CURRENT THERAPY: Maintenance Velcade every other week.  BRIEF ONCOLOGIC HISTORY:  Oncology History  Multiple myeloma not having achieved remission ([facility])  01/20/2018 Initial Diagnosis   Multiple myeloma not having achieved remission ([facility])   01/26/2018 -  Chemotherapy   Patient is on Treatment Plan : MYELOMA  RVD SQ (Bortezomib d 1,8,15 ) q28d x 4 cycles       CANCER STAGING:  Cancer Staging  No matching staging information was found for the patient.  INTERVAL HISTORY:  Brittany Archer, a 81 y.o. female, returns for follow-up of multiple myeloma.  She is tolerating Velcade every other week very well.  Denies any tingling or numbness in extremities.  Denies any diarrhea or nausea or vomiting.  No recent infections or hospitalizations.   REVIEW OF SYSTEMS:  Review of Systems  Constitutional:  Negative for appetite change and fatigue.  All other systems reviewed and are negative.   PAST MEDICAL/SURGICAL HISTORY:  Past Medical History:  Diagnosis Date   Breast cancer ([facility])    left breast/ 2008/ surg/ rad tx   Coronary artery disease    Diabetes mellitus    Past Surgical History:  Procedure Laterality Date   ABDOMINAL HYSTERECTOMY     BREAST SURGERY     DEBRIDEMENT MANDIBLE N/A 02/22/2020   Procedure: INCISION AND DRAINAGE WITH DEBRIDEMENT MANDIBLE;  Surgeon: Michael Litter, DMD;  Location: [facility];  Service: Oral Surgery;  Laterality: N/A;   DEBRIDEMENT MANDIBLE Right 03/19/2021   Procedure: DEBRIDEMENT OF BONE RIGHT INTERIOR  MANDIBLE;  Surgeon: Michael Litter, DMD;  Location: [facility];  Service: Oral Surgery;  Laterality: Right;   EYE SURGERY  2021   cataract removals    TOOTH  EXTRACTION N/A 02/22/2020   Procedure: DENTAL RESTORATION/EXTRACTIONS;  Surgeon: Michael Litter, DMD;  Location: [facility];  Service: Oral Surgery;  Laterality: N/A;  DENTAL KIT REQUESTED    SOCIAL HISTORY:  Social History   Socioeconomic History   Marital status: Divorced    Spouse name: Not on file   Number of children: Not on file   Years of education: Not on file   Highest education level: Not on file  Occupational History   Not on file  Tobacco Use   Smoking status: Never   Smokeless tobacco: Never  Vaping Use   Vaping Use: Never used  Substance and Sexual Activity   Alcohol use: No   Drug use: No   Sexual activity: Yes    Birth control/protection: Surgical  Other Topics Concern   Not on file  Social History Narrative   Not on file   Social Determinants of Health   Financial Resource Strain: Low Risk  (12/09/2020)   Overall Financial Resource Strain (CARDIA)    Difficulty of Paying Living Expenses: Not hard at all  Food Insecurity: No Food Insecurity (12/09/2020)   Hunger Vital Sign    Worried About Running Out of Food in the Last Year: Never true    Ran Out of Food in the Last Year: Never true  Transportation Needs: No Transportation Needs (12/09/2020)   PRAPARE - Hydrologist (Medical): No    Lack of Transportation (Non-Medical):  No  Physical Activity: Inactive (12/09/2020)   Exercise Vital Sign    Days of Exercise per Week: 0 days    Minutes of Exercise per Session: 0 min  Stress: No Stress Concern Present (12/09/2020)   [place]    Feeling of Stress : Not at all  Social Connections: Moderately Isolated (12/09/2020)   Social Connection and Isolation Panel [NHANES]    Frequency of Communication with Friends and Family: More than three times a week    Frequency of Social Gatherings with Friends and Family: More than three times a week    Attends Religious Services:  More than 4 times per year    Active Member of [employer] or Organizations: No    Attends Archivist Meetings: Never    Marital Status: Divorced  Human resources officer Violence: Not At Risk (12/09/2020)   Humiliation, Afraid, Rape, and Kick questionnaire    Fear of Current or Ex-Partner: No    Emotionally Abused: No    Physically Abused: No    Sexually Abused: No    FAMILY HISTORY:  Family History  Problem Relation Age of Onset   Obesity Sister     CURRENT MEDICATIONS:  Current Outpatient Medications  Medication Sig Dispense Refill   acyclovir (ZOVIRAX) 400 MG tablet TAKE 1 TABLET BY MOUTH TWICE DAILY (Patient taking differently: Take 400 mg by mouth 2 (two) times daily.) 60 tablet 11   aspirin 81 MG tablet Take 1 tablet (81 mg total) by mouth daily with breakfast. 30 tablet 5   bortezomib IV (VELCADE) 3.5 MG injection Inject 3.5 mg into the vein once a week. weekly     chlorhexidine (PERIDEX) 0.12 % solution Use as directed 15 mLs in the mouth or throat 3 (three) times daily.     cholecalciferol (VITAMIN D) 25 MCG (1000 UNIT) tablet 1 capsule     dexamethasone (DECADRON) 4 MG tablet Take 10 tablets (40 mg) on days 1, 8, and 15 of chemo. Repeat every 21 days. (Patient taking differently: Take 4 mg by mouth See admin instructions. Take 10 tablets (40 mg) on days 1, 8, and 15 of chemo. Repeat every 21 days.) 30 tablet 3   glucose blood (ACCU-CHEK AVIVA PLUS) test strip CHECK BLOOD SUGAR ONCE DAILY     GNP ASPIRIN LOW DOSE 81 MG tablet Take 81 mg by mouth daily.     magnesium oxide (MAG-OX) 400 (240 Mg) MG tablet TAKE (1) TABLET BY MOUTH THREE TIMES DAILY, MORNING, NOON AND BEDTIME. 84 tablet 0   mirtazapine (REMERON) 15 MG tablet Take 0.5 tablets (7.5 mg total) by mouth at bedtime. For appetite stimulation 30 tablet 2   ondansetron (ZOFRAN) 8 MG tablet      Potassium Acetate POWD Take 20 mEq by mouth daily. 12000 g 5   potassium chloride (KLOR-CON) 20 MEQ packet Take 40 mEq by mouth  daily. 60 packet 3   REVLIMID 20 MG capsule Take by mouth.     No current facility-administered medications for this visit.   Facility-Administered Medications Ordered in Other Visits  Medication Dose Route Frequency Provider Last Rate Last Admin   potassium chloride SA (KLOR-CON M) CR tablet 40 mEq  40 mEq Oral BID Derek Jack, MD   40 mEq at 08/04/22 1112    ALLERGIES:  Allergies  Allergen Reactions   Other Other (See Comments)   Seasonal Ic [Cholestatin] Other (See Comments)    Sneezing, watery eyes   Motrin [  Ibuprofen] Rash    PHYSICAL EXAM:  Performance status (ECOG): 1 - Symptomatic but completely ambulatory  Vitals:   08/04/22 1016  BP: (!) 169/69  Pulse: 60  Resp: 18  Temp: (!) 97.4 F (36.3 C)  SpO2: 100%   Wt Readings from Last 3 Encounters:  08/04/22 164 lb 3.2 oz (74.5 kg)  07/21/22 163 lb (73.9 kg)  06/23/22 152 lb 14.4 oz (69.4 kg)   Physical Exam Vitals reviewed.  Constitutional:      Appearance: Normal appearance.  Cardiovascular:     Rate and Rhythm: Normal rate and regular rhythm.     Pulses: Normal pulses.     Heart sounds: Normal heart sounds.  Pulmonary:     Effort: Pulmonary effort is normal.     Breath sounds: Normal breath sounds.  Neurological:     General: No focal deficit present.     Mental Status: She is alert and oriented to person, place, and time.  Psychiatric:        Mood and Affect: Mood normal.        Behavior: Behavior normal.     LABORATORY DATA:  I have reviewed the labs as listed.     Latest Ref Rng & Units 08/04/2022    9:14 AM 07/21/2022    8:00 AM 07/07/2022    7:58 AM  CBC  WBC 4.0 - 10.5 K/uL 3.9  4.1  12.5   Hemoglobin 12.0 - 15.0 g/dL 8.2  6.6  9.7   Hematocrit 36.0 - 46.0 % 25.2  20.8  30.1   Platelets 150 - 400 K/uL 181  186  173       Latest Ref Rng & Units 08/04/2022    9:14 AM 07/21/2022    8:00 AM 07/07/2022    7:58 AM  CMP  Glucose 70 - 99 mg/dL 106  124  189   BUN 8 - 23 mg/dL _0 Creatinine 0.44 - 1.00 mg/dL 0.95  1.00  1.03   Sodium 135 - 145 mmol/L 145  143  139   Potassium 3.5 - 5.1 mmol/L 3.2  2.7  2.7   Chloride 98 - 111 mmol/L 118  114  106   CO2 22 - 32 mmol/L _1 Calcium 8.9 - 10.3 mg/dL 8.7  8.5  8.8   Total Protein 6.5 - 8.1 g/dL 6.1  6.1  6.4   Total Bilirubin 0.3 - 1.2 mg/dL 0.7  0.6  1.0   Alkaline Phos 38 - 126 U/L 77  84  89   AST 15 - 41 U/L _2 ALT 0 - 44 U/L _3 DIAGNOSTIC IMAGING:  I have independently reviewed the scans and discussed with the patient. No results found.   ASSESSMENT:  1.  IgA kappa plasma cell myeloma, stage I: -RVD started on 01/09/2018, held since 02/14/2020 due to mandible abscess. -Myeloma labs on 05/14/2020 showed progression with M spike of 0.5 g. -RVD started back on 05/21/2020. -Myeloma labs on 07/10/2020 shows M spike improved to 0.3 g from 0.6 g previously.  Free light chain ratio is 1.74 with kappa light chains 28.4. -Myeloma panel from 08/14/2020 shows M spike 0.4 g.  Kappa light chains are 24.8 and ratio is 2.23. - Revlimid (20 mg 2 weeks on/1 week off) and Velcade (3 weeks on/1 week off) were held after 02/26/2022 due to  weight loss. - Velcade maintenance every 2 weeks started on 04/13/2022.   2.  Osteomyelitis of the right mandible/dental abscess: -Finished IV ceftriaxone on 04/03/2020.  Finished oral antibiotics. -We will hold Xgeva indefinitely.   PLAN:  1.  IgA kappa plasma cell myeloma, stage I: - She is tolerating Velcade every other week very well. - Reviewed myeloma panel from 07/21/2022.  M spike is undetectable.  Free light chain ratio has increased to 3.02 from 2.6.  This has gradually been trending up.  Kappa light chains are 48. - I would not make any changes at this time.  Continue Velcade every 2 weeks. - RTC 8 weeks for follow-up with myeloma labs 2 weeks prior.   2.  Severe hypokalemia: - Continue potassium 3 times daily at home.  Potassium today is 3.2.  She will  receive oral potassium in our clinic.   3.  Osteomyelitis of the right mandible/dental abscess: - Bisphosphonates on hold indefinitely due to ONJ.   4.  Hypomagnesemia: - Continue magnesium 3 times daily.  Magnesium is 1.5 today.   5.  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  Denies any bleeding per rectum or melena. - Ferritin was 138 and percent saturation 39 on 07/21/2022.  Hemoglobin dropped to 6.6 at the time.  She received 1 unit PRBC on 07/21/2022.  Today hemoglobin is 8.2 with MCV of 102.  I have recommended Feraheme weekly x2.   Orders placed this encounter:  No orders of the defined types were placed in this encounter.    Derek Jack, MD [facility] [phone]

## 2022-08-04 NOTE — Patient Instructions (Addendum)
[facility] at [facility] Discharge Instructions   You were seen and examined today by Dr. Delton Coombes.  He reviewed the results of your lab work which is normal/stable. One of your myeloma labs is slowly creeping up. We will just monitor at this time. If it continues to rise, we will start you on a pill in addition to Velcade every 2 weeks.   We will give you IV magnesium and potassium pills in clinic today as these were low today.  Return as scheduled.    Thank you for choosing [facility] at [facility] to provide your oncology and hematology care.  To afford each patient quality time with our provider, please arrive at least 15 minutes before your scheduled appointment time.   If you have a lab appointment with the [facility] please come in thru the Main Entrance and check in at the main information desk.  You need to re-schedule your appointment should you arrive 10 or more minutes late.  We strive to give you quality time with our providers, and arriving late affects you and other patients whose appointments are after yours.  Also, if you no show three or more times for appointments you may be dismissed from the clinic at the providers discretion.     Again, thank you for choosing [facility].  Our hope is that these requests will decrease the amount of time that you wait before being seen by our physicians.       _____________________________________________________________  Should you have questions after your visit to [facility], please contact our office at [phone] and follow the prompts.  Our office hours are 8:00 a.m. and 4:30 p.m. Monday - Friday.  Please note that voicemails left after 4:00 p.m. may not be returned until the following business day.  We are closed weekends and major holidays.  You do have access to a nurse 24-7, just call the main number to the clinic [phone] and do not press any  options, hold on the line and a nurse will answer the phone.    For prescription refill requests, have your pharmacy contact our office and allow 72 hours.    Due to Covid, you will need to wear a mask upon entering the hospital. If you do not have a mask, a mask will be given to you at the Main Entrance upon arrival. For doctor visits, patients may have 1 support person age 30 or older with them. For treatment visits, patients can not have anyone with them due to social distancing guidelines and our immunocompromised population.

## 2022-08-04 NOTE — Patient Instructions (Signed)
[facility]  Discharge Instructions: Thank you for choosing [facility] to provide your oncology and hematology care.  If you have a lab appointment with the [facility], please come in thru the Main Entrance and check in at the main information desk.  Wear comfortable clothing and clothing appropriate for easy access to any Portacath or PICC line.   We strive to give you quality time with your provider. You may need to reschedule your appointment if you arrive late (15 or more minutes).  Arriving late affects you and other patients whose appointments are after yours.  Also, if you miss three or more appointments without notifying the office, you may be dismissed from the clinic at the provider's discretion.      For prescription refill requests, have your pharmacy contact our office and allow 72 hours for refills to be completed.    Today you received the following chemotherapy velcade, retacrit, magnesium infusion   To help prevent nausea and vomiting after your treatment, we encourage you to take your nausea medication as directed.  BELOW ARE SYMPTOMS THAT SHOULD BE REPORTED IMMEDIATELY: *FEVER GREATER THAN 100.4 F (38 C) OR HIGHER *CHILLS OR SWEATING *NAUSEA AND VOMITING THAT IS NOT CONTROLLED WITH YOUR NAUSEA MEDICATION *UNUSUAL SHORTNESS OF BREATH *UNUSUAL BRUISING OR BLEEDING *URINARY PROBLEMS (pain or burning when urinating, or frequent urination) *BOWEL PROBLEMS (unusual diarrhea, constipation, pain near the anus) TENDERNESS IN MOUTH AND THROAT WITH OR WITHOUT PRESENCE OF ULCERS (sore throat, sores in mouth, or a toothache) UNUSUAL RASH, SWELLING OR PAIN  UNUSUAL VAGINAL DISCHARGE OR ITCHING   Items with * indicate a potential emergency and should be followed up as soon as possible or go to the Emergency Department if any problems should occur.  Please show the CHEMOTHERAPY ALERT CARD or IMMUNOTHERAPY ALERT CARD at check-in to the Emergency  Department and triage nurse.  Should you have questions after your visit or need to cancel or reschedule your appointment, please contact [facility] [phone]  and follow the prompts.  Office hours are 8:00 a.m. to 4:30 p.m. Monday - Friday. Please note that voicemails left after 4:00 p.m. may not be returned until the following business day.  We are closed weekends and major holidays. You have access to a nurse at all times for urgent questions. Please call the main number to the clinic [phone] and follow the prompts.  For any non-urgent questions, you may also contact your provider using MyChart. We now offer e-Visits for anyone 75 and older to request care online for non-urgent symptoms. For details visit [email or web address].   Also download the MyChart app! Go to the app store, search "MyChart", open the app, select [facility], and log in with your MyChart username and password.  Masks are optional in the cancer centers. If you would like for your care team to wear a mask while they are taking care of you, please let them know. For doctor visits, patients may have with them one support person who is at least 82 years old. At this time, visitors are not allowed in the infusion area.

## 2022-08-04 NOTE — Progress Notes (Signed)
Labs reviewed with MD today. Will proceed with treatment per MD.   Treatment given per orders. Patient tolerated it well without problems. Vitals stable and discharged home from clinic ambulatory. Follow up as scheduled.  

## 2022-08-05 ENCOUNTER — Other Ambulatory Visit: Payer: Self-pay

## 2022-08-06 ENCOUNTER — Other Ambulatory Visit: Payer: Self-pay

## 2022-08-15 ENCOUNTER — Other Ambulatory Visit: Payer: Self-pay

## 2022-08-18 ENCOUNTER — Inpatient Hospital Stay: Payer: Medicare Other

## 2022-08-18 ENCOUNTER — Other Ambulatory Visit: Payer: Self-pay

## 2022-08-18 VITALS — BP 170/71 | HR 64 | Temp 97.1°F | Resp 18 | Wt 154.5 lb

## 2022-08-18 DIAGNOSIS — D649 Anemia, unspecified: Secondary | ICD-10-CM

## 2022-08-18 DIAGNOSIS — C9000 Multiple myeloma not having achieved remission: Secondary | ICD-10-CM

## 2022-08-18 DIAGNOSIS — E538 Deficiency of other specified B group vitamins: Secondary | ICD-10-CM

## 2022-08-18 DIAGNOSIS — Z5112 Encounter for antineoplastic immunotherapy: Secondary | ICD-10-CM | POA: Diagnosis not present

## 2022-08-18 LAB — CBC WITH DIFFERENTIAL/PLATELET
Abs Immature Granulocytes: 0.01 10*3/uL (ref 0.00–0.07)
Basophils Absolute: 0 10*3/uL (ref 0.0–0.1)
Basophils Relative: 1 %
Eosinophils Absolute: 0.1 10*3/uL (ref 0.0–0.5)
Eosinophils Relative: 1 %
HCT: 30.6 % — ABNORMAL LOW (ref 36.0–46.0)
Hemoglobin: 9.8 g/dL — ABNORMAL LOW (ref 12.0–15.0)
Immature Granulocytes: 0 %
Lymphocytes Relative: 22 %
Lymphs Abs: 1.1 10*3/uL (ref 0.7–4.0)
MCH: 32.6 pg (ref 26.0–34.0)
MCHC: 32 g/dL (ref 30.0–36.0)
MCV: 101.7 fL — ABNORMAL HIGH (ref 80.0–100.0)
Monocytes Absolute: 0.3 10*3/uL (ref 0.1–1.0)
Monocytes Relative: 7 %
Neutro Abs: 3.5 10*3/uL (ref 1.7–7.7)
Neutrophils Relative %: 69 %
Platelets: 217 10*3/uL (ref 150–400)
RBC: 3.01 MIL/uL — ABNORMAL LOW (ref 3.87–5.11)
RDW: 16.4 % — ABNORMAL HIGH (ref 11.5–15.5)
WBC: 5 10*3/uL (ref 4.0–10.5)
nRBC: 0 % (ref 0.0–0.2)

## 2022-08-18 LAB — COMPREHENSIVE METABOLIC PANEL
ALT: 10 U/L (ref 0–44)
AST: 12 U/L — ABNORMAL LOW (ref 15–41)
Albumin: 3.8 g/dL (ref 3.5–5.0)
Alkaline Phosphatase: 95 U/L (ref 38–126)
Anion gap: 8 (ref 5–15)
BUN: 31 mg/dL — ABNORMAL HIGH (ref 8–23)
CO2: 20 mmol/L — ABNORMAL LOW (ref 22–32)
Calcium: 9.6 mg/dL (ref 8.9–10.3)
Chloride: 110 mmol/L (ref 98–111)
Creatinine, Ser: 1.1 mg/dL — ABNORMAL HIGH (ref 0.44–1.00)
GFR, Estimated: 50 mL/min — ABNORMAL LOW (ref >60)
Glucose, Bld: 200 mg/dL — ABNORMAL HIGH (ref 70–99)
Potassium: 2.6 mmol/L — CL (ref 3.5–5.1)
Sodium: 138 mmol/L (ref 135–145)
Total Bilirubin: 1.2 mg/dL (ref 0.3–1.2)
Total Protein: 6.9 g/dL (ref 6.5–8.1)

## 2022-08-18 LAB — MAGNESIUM: Magnesium: 1.8 mg/dL (ref 1.7–2.4)

## 2022-08-18 LAB — SAMPLE TO BLOOD BANK

## 2022-08-18 MED ORDER — ACETAMINOPHEN 325 MG PO TABS
650.0000 mg | ORAL_TABLET | Freq: Once | ORAL | Status: AC
  Administered 2022-08-18: 650 mg via ORAL
  Filled 2022-08-18: qty 2

## 2022-08-18 MED ORDER — PROCHLORPERAZINE MALEATE 10 MG PO TABS
10.0000 mg | ORAL_TABLET | Freq: Once | ORAL | Status: AC
  Administered 2022-08-18: 10 mg via ORAL
  Filled 2022-08-18: qty 1

## 2022-08-18 MED ORDER — EPOETIN ALFA-EPBX 20000 UNIT/ML IJ SOLN: 20000.0000 [IU] | Freq: Once | INTRAMUSCULAR | Status: DC

## 2022-08-18 MED ORDER — BORTEZOMIB CHEMO SQ INJECTION 3.5 MG (2.5MG/ML)
1.3000 mg/m2 | Freq: Once | INTRAMUSCULAR | Status: AC
  Administered 2022-08-18: 2.25 mg via SUBCUTANEOUS
  Filled 2022-08-18: qty 0.9

## 2022-08-18 MED ORDER — DEXAMETHASONE 4 MG PO TABS
20.0000 mg | ORAL_TABLET | Freq: Once | ORAL | Status: AC
  Administered 2022-08-18: 20 mg via ORAL
  Filled 2022-08-18: qty 5

## 2022-08-18 MED ORDER — LORATADINE 10 MG PO TABS
10.0000 mg | ORAL_TABLET | Freq: Once | ORAL | Status: AC
  Administered 2022-08-18: 10 mg via ORAL
  Filled 2022-08-18: qty 1

## 2022-08-18 MED ORDER — SODIUM CHLORIDE 0.9 % IV SOLN: Freq: Once | INTRAVENOUS | Status: AC

## 2022-08-18 MED ORDER — POTASSIUM CHLORIDE CRYS ER 20 MEQ PO TBCR
40.0000 meq | EXTENDED_RELEASE_TABLET | ORAL | Status: AC
  Administered 2022-08-18 (×2): 40 meq via ORAL
  Filled 2022-08-18 (×2): qty 2

## 2022-08-18 MED ORDER — CYANOCOBALAMIN 1000 MCG/ML IJ SOLN
1000.0000 ug | Freq: Once | INTRAMUSCULAR | Status: AC
  Administered 2022-08-18: 1000 ug via INTRAMUSCULAR
  Filled 2022-08-18: qty 1

## 2022-08-18 MED ORDER — SODIUM CHLORIDE 0.9 % IV SOLN
510.0000 mg | Freq: Once | INTRAVENOUS | Status: AC
  Administered 2022-08-18: 510 mg via INTRAVENOUS
  Filled 2022-08-18: qty 17

## 2022-08-18 MED ORDER — POTASSIUM CHLORIDE 10 MEQ/100ML IV SOLN
10.0000 meq | INTRAVENOUS | Status: AC
  Administered 2022-08-18 (×2): 10 meq via INTRAVENOUS
  Filled 2022-08-18 (×2): qty 100

## 2022-08-18 NOTE — Progress Notes (Signed)
Na

## 2022-08-18 NOTE — Patient Instructions (Signed)
[facility]  Discharge Instructions: Thank you for choosing [facility] to provide your oncology and hematology care.  If you have a lab appointment with the [facility], please come in thru the Main Entrance and check in at the main information desk.  Wear comfortable clothing and clothing appropriate for easy access to any Portacath or PICC line.   We strive to give you quality time with your provider. You may need to reschedule your appointment if you arrive late (15 or more minutes).  Arriving late affects you and other patients whose appointments are after yours.  Also, if you miss three or more appointments without notifying the office, you may be dismissed from the clinic at the provider's discretion.      For prescription refill requests, have your pharmacy contact our office and allow 72 hours for refills to be completed.    Today you received the following chemotherapy and/or immunotherapy agents Velcade, b12, feraheme, potassium      To help prevent nausea and vomiting after your treatment, we encourage you to take your nausea medication as directed.  BELOW ARE SYMPTOMS THAT SHOULD BE REPORTED IMMEDIATELY: *FEVER GREATER THAN 100.4 F (38 C) OR HIGHER *CHILLS OR SWEATING *NAUSEA AND VOMITING THAT IS NOT CONTROLLED WITH YOUR NAUSEA MEDICATION *UNUSUAL SHORTNESS OF BREATH *UNUSUAL BRUISING OR BLEEDING *URINARY PROBLEMS (pain or burning when urinating, or frequent urination) *BOWEL PROBLEMS (unusual diarrhea, constipation, pain near the anus) TENDERNESS IN MOUTH AND THROAT WITH OR WITHOUT PRESENCE OF ULCERS (sore throat, sores in mouth, or a toothache) UNUSUAL RASH, SWELLING OR PAIN  UNUSUAL VAGINAL DISCHARGE OR ITCHING   Items with * indicate a potential emergency and should be followed up as soon as possible or go to the Emergency Department if any problems should occur.  Please show the CHEMOTHERAPY ALERT CARD or IMMUNOTHERAPY ALERT CARD at  check-in to the Emergency Department and triage nurse.  Should you have questions after your visit or need to cancel or reschedule your appointment, please contact [facility] [phone]  and follow the prompts.  Office hours are 8:00 a.m. to 4:30 p.m. Monday - Friday. Please note that voicemails left after 4:00 p.m. may not be returned until the following business day.  We are closed weekends and major holidays. You have access to a nurse at all times for urgent questions. Please call the main number to the clinic [phone] and follow the prompts.  For any non-urgent questions, you may also contact your provider using MyChart. We now offer e-Visits for anyone 85 and older to request care online for non-urgent symptoms. For details visit [email or web address].   Also download the MyChart app! Go to the app store, search "MyChart", open the app, select [facility], and log in with your MyChart username and password.  Masks are optional in the cancer centers. If you would like for your care team to wear a mask while they are taking care of you, please let them know. You may have one support person who is at least 82 years old accompany you for your appointments.

## 2022-08-18 NOTE — Progress Notes (Signed)
CRITICAL VALUE ALERT Critical value received:  potassium 2.6 Date of notification:  08-18-2022 Time of notification: 10:55 am.  Critical value read back:  Yes.   Nurse who received alert:  B. Anetha Slagel RN MD notified time and response:  [place] @ 10:57am. Standing orders per Dr. Delton Coombes.

## 2022-08-18 NOTE — Progress Notes (Signed)
Patient presents today for Feraheme/B12 and Velcade per providers order.  Vital signs within parameters for treatment.  Labs reviewed and Potassium noted to be 2.6.  Patient will receive PO potassium and Potassium runs per protocol.  Peripheral IV started and blood return noted pre and post infusion.  Treatment given today per MD orders.  Stable during infusion without adverse affects.  Vital signs stable.  No complaints at this time.  Discharge from clinic ambulatory in stable condition.  Alert and oriented X 3.  Follow up with [facility] as scheduled.

## 2022-08-28 ENCOUNTER — Other Ambulatory Visit: Payer: Self-pay

## 2022-08-28 DIAGNOSIS — D649 Anemia, unspecified: Secondary | ICD-10-CM

## 2022-08-29 ENCOUNTER — Other Ambulatory Visit: Payer: Self-pay

## 2022-08-30 ENCOUNTER — Other Ambulatory Visit: Payer: Self-pay

## 2022-09-01 ENCOUNTER — Inpatient Hospital Stay: Payer: Medicare Other | Attending: Hematology

## 2022-09-01 ENCOUNTER — Inpatient Hospital Stay: Payer: Medicare Other

## 2022-09-01 ENCOUNTER — Other Ambulatory Visit: Payer: Self-pay | Admitting: Hematology

## 2022-09-01 VITALS — BP 156/70 | HR 65 | Temp 98.8°F | Resp 18 | Wt 160.9 lb

## 2022-09-01 DIAGNOSIS — E538 Deficiency of other specified B group vitamins: Secondary | ICD-10-CM

## 2022-09-01 DIAGNOSIS — N1832 Chronic kidney disease, stage 3b: Secondary | ICD-10-CM | POA: Insufficient documentation

## 2022-09-01 DIAGNOSIS — Z5112 Encounter for antineoplastic immunotherapy: Secondary | ICD-10-CM | POA: Diagnosis present

## 2022-09-01 DIAGNOSIS — C9000 Multiple myeloma not having achieved remission: Secondary | ICD-10-CM | POA: Insufficient documentation

## 2022-09-01 DIAGNOSIS — D631 Anemia in chronic kidney disease: Secondary | ICD-10-CM | POA: Diagnosis not present

## 2022-09-01 DIAGNOSIS — D649 Anemia, unspecified: Secondary | ICD-10-CM

## 2022-09-01 LAB — CBC WITH DIFFERENTIAL/PLATELET
Abs Immature Granulocytes: 0.01 10*3/uL (ref 0.00–0.07)
Basophils Absolute: 0 10*3/uL (ref 0.0–0.1)
Basophils Relative: 1 %
Eosinophils Absolute: 0.1 10*3/uL (ref 0.0–0.5)
Eosinophils Relative: 1 %
HCT: 29.4 % — ABNORMAL LOW (ref 36.0–46.0)
Hemoglobin: 9.4 g/dL — ABNORMAL LOW (ref 12.0–15.0)
Immature Granulocytes: 0 %
Lymphocytes Relative: 21 %
Lymphs Abs: 0.9 10*3/uL (ref 0.7–4.0)
MCH: 32.8 pg (ref 26.0–34.0)
MCHC: 32 g/dL (ref 30.0–36.0)
MCV: 102.4 fL — ABNORMAL HIGH (ref 80.0–100.0)
Monocytes Absolute: 0.4 10*3/uL (ref 0.1–1.0)
Monocytes Relative: 8 %
Neutro Abs: 3 10*3/uL (ref 1.7–7.7)
Neutrophils Relative %: 69 %
Platelets: 163 10*3/uL (ref 150–400)
RBC: 2.87 MIL/uL — ABNORMAL LOW (ref 3.87–5.11)
RDW: 16 % — ABNORMAL HIGH (ref 11.5–15.5)
WBC: 4.3 10*3/uL (ref 4.0–10.5)
nRBC: 0 % (ref 0.0–0.2)

## 2022-09-01 LAB — COMPREHENSIVE METABOLIC PANEL
ALT: 11 U/L (ref 0–44)
AST: 14 U/L — ABNORMAL LOW (ref 15–41)
Albumin: 3.4 g/dL — ABNORMAL LOW (ref 3.5–5.0)
Alkaline Phosphatase: 100 U/L (ref 38–126)
Anion gap: 6 (ref 5–15)
BUN: 14 mg/dL (ref 8–23)
CO2: 20 mmol/L — ABNORMAL LOW (ref 22–32)
Calcium: 8.9 mg/dL (ref 8.9–10.3)
Chloride: 116 mmol/L — ABNORMAL HIGH (ref 98–111)
Creatinine, Ser: 1.11 mg/dL — ABNORMAL HIGH (ref 0.44–1.00)
GFR, Estimated: 50 mL/min — ABNORMAL LOW (ref >60)
Glucose, Bld: 203 mg/dL — ABNORMAL HIGH (ref 70–99)
Potassium: 3 mmol/L — ABNORMAL LOW (ref 3.5–5.1)
Sodium: 142 mmol/L (ref 135–145)
Total Bilirubin: 0.6 mg/dL (ref 0.3–1.2)
Total Protein: 6.1 g/dL — ABNORMAL LOW (ref 6.5–8.1)

## 2022-09-01 LAB — MAGNESIUM: Magnesium: 1.5 mg/dL — ABNORMAL LOW (ref 1.7–2.4)

## 2022-09-01 LAB — SAMPLE TO BLOOD BANK

## 2022-09-01 MED ORDER — ACETAMINOPHEN 325 MG PO TABS
650.0000 mg | ORAL_TABLET | Freq: Once | ORAL | Status: AC
  Administered 2022-09-01: 650 mg via ORAL
  Filled 2022-09-01: qty 2

## 2022-09-01 MED ORDER — POTASSIUM CHLORIDE CRYS ER 20 MEQ PO TBCR
40.0000 meq | EXTENDED_RELEASE_TABLET | Freq: Once | ORAL | Status: AC
  Administered 2022-09-01: 40 meq via ORAL
  Filled 2022-09-01: qty 2

## 2022-09-01 MED ORDER — DEXAMETHASONE 4 MG PO TABS
20.0000 mg | ORAL_TABLET | Freq: Once | ORAL | Status: AC
  Administered 2022-09-01: 20 mg via ORAL
  Filled 2022-09-01: qty 5

## 2022-09-01 MED ORDER — EPOETIN ALFA-EPBX 20000 UNIT/ML IJ SOLN: 20000.0000 [IU] | Freq: Once | INTRAMUSCULAR | Status: DC

## 2022-09-01 MED ORDER — SODIUM CHLORIDE 0.9 % IV SOLN: Freq: Once | INTRAVENOUS | Status: AC

## 2022-09-01 MED ORDER — LORATADINE 10 MG PO TABS
10.0000 mg | ORAL_TABLET | Freq: Once | ORAL | Status: AC
  Administered 2022-09-01: 10 mg via ORAL
  Filled 2022-09-01: qty 1

## 2022-09-01 MED ORDER — PROCHLORPERAZINE MALEATE 10 MG PO TABS
10.0000 mg | ORAL_TABLET | Freq: Once | ORAL | Status: AC
  Administered 2022-09-01: 10 mg via ORAL
  Filled 2022-09-01: qty 1

## 2022-09-01 MED ORDER — BORTEZOMIB CHEMO SQ INJECTION 3.5 MG (2.5MG/ML)
1.3000 mg/m2 | Freq: Once | INTRAMUSCULAR | Status: AC
  Administered 2022-09-01: 2.25 mg via SUBCUTANEOUS
  Filled 2022-09-01: qty 0.9

## 2022-09-01 MED ORDER — MAGNESIUM SULFATE 2 GM/50ML IV SOLN
2.0000 g | Freq: Once | INTRAVENOUS | Status: AC
  Administered 2022-09-01: 2 g via INTRAVENOUS
  Filled 2022-09-01: qty 50

## 2022-09-01 MED ORDER — SODIUM CHLORIDE 0.9 % IV SOLN
510.0000 mg | Freq: Once | INTRAVENOUS | Status: AC
  Administered 2022-09-01: 510 mg via INTRAVENOUS
  Filled 2022-09-01: qty 17

## 2022-09-01 NOTE — Patient Instructions (Signed)
[facility]  Discharge Instructions: Thank you for choosing [facility] to provide your oncology and hematology care.  If you have a lab appointment with the [facility], please come in thru the Main Entrance and check in at the main information desk.  Wear comfortable clothing and clothing appropriate for easy access to any Portacath or PICC line.   We strive to give you quality time with your provider. You may need to reschedule your appointment if you arrive late (15 or more minutes).  Arriving late affects you and other patients whose appointments are after yours.  Also, if you miss three or more appointments without notifying the office, you may be dismissed from the clinic at the provider's discretion.      For prescription refill requests, have your pharmacy contact our office and allow 72 hours for refills to be completed.    Today you received Feraheme infusion, velcade.    To help prevent nausea and vomiting after your treatment, we encourage you to take your nausea medication as directed.  BELOW ARE SYMPTOMS THAT SHOULD BE REPORTED IMMEDIATELY: *FEVER GREATER THAN 100.4 F (38 C) OR HIGHER *CHILLS OR SWEATING *NAUSEA AND VOMITING THAT IS NOT CONTROLLED WITH YOUR NAUSEA MEDICATION *UNUSUAL SHORTNESS OF BREATH *UNUSUAL BRUISING OR BLEEDING *URINARY PROBLEMS (pain or burning when urinating, or frequent urination) *BOWEL PROBLEMS (unusual diarrhea, constipation, pain near the anus) TENDERNESS IN MOUTH AND THROAT WITH OR WITHOUT PRESENCE OF ULCERS (sore throat, sores in mouth, or a toothache) UNUSUAL RASH, SWELLING OR PAIN  UNUSUAL VAGINAL DISCHARGE OR ITCHING   Items with * indicate a potential emergency and should be followed up as soon as possible or go to the Emergency Department if any problems should occur.  Please show the CHEMOTHERAPY ALERT CARD or IMMUNOTHERAPY ALERT CARD at check-in to the Emergency Department and triage  nurse.  Should you have questions after your visit or need to cancel or reschedule your appointment, please contact [facility] [phone]  and follow the prompts.  Office hours are 8:00 a.m. to 4:30 p.m. Monday - Friday. Please note that voicemails left after 4:00 p.m. may not be returned until the following business day.  We are closed weekends and major holidays. You have access to a nurse at all times for urgent questions. Please call the main number to the clinic [phone] and follow the prompts.  For any non-urgent questions, you may also contact your provider using MyChart. We now offer e-Visits for anyone 50 and older to request care online for non-urgent symptoms. For details visit [email or web address].   Also download the MyChart app! Go to the app store, search "MyChart", open the app, select [facility], and log in with your MyChart username and password.  Masks are optional in the cancer centers. If you would like for your care team to wear a mask while they are taking care of you, please let them know. You may have one support person who is at least 82 years old accompany you for your appointments.

## 2022-09-01 NOTE — Progress Notes (Signed)
Labs reviewed today per MD. Will treat per parameters.  Treatment given per orders. Patient tolerated it well without problems. Vitals stable and discharged home from clinic ambulatory. Follow up as scheduled.

## 2022-09-05 ENCOUNTER — Other Ambulatory Visit: Payer: Self-pay

## 2022-09-15 ENCOUNTER — Inpatient Hospital Stay: Payer: Medicare Other

## 2022-09-15 VITALS — BP 181/71 | HR 66 | Temp 97.8°F | Resp 20 | Wt 165.6 lb

## 2022-09-15 DIAGNOSIS — C9000 Multiple myeloma not having achieved remission: Secondary | ICD-10-CM

## 2022-09-15 DIAGNOSIS — E538 Deficiency of other specified B group vitamins: Secondary | ICD-10-CM

## 2022-09-15 DIAGNOSIS — D649 Anemia, unspecified: Secondary | ICD-10-CM

## 2022-09-15 DIAGNOSIS — E876 Hypokalemia: Secondary | ICD-10-CM

## 2022-09-15 DIAGNOSIS — Z5112 Encounter for antineoplastic immunotherapy: Secondary | ICD-10-CM | POA: Diagnosis not present

## 2022-09-15 LAB — CBC WITH DIFFERENTIAL/PLATELET
Abs Immature Granulocytes: 0.01 10*3/uL (ref 0.00–0.07)
Basophils Absolute: 0 10*3/uL (ref 0.0–0.1)
Basophils Relative: 0 %
Eosinophils Absolute: 0 10*3/uL (ref 0.0–0.5)
Eosinophils Relative: 1 %
HCT: 30.3 % — ABNORMAL LOW (ref 36.0–46.0)
Hemoglobin: 9.8 g/dL — ABNORMAL LOW (ref 12.0–15.0)
Immature Granulocytes: 0 %
Lymphocytes Relative: 19 %
Lymphs Abs: 1 10*3/uL (ref 0.7–4.0)
MCH: 33.4 pg (ref 26.0–34.0)
MCHC: 32.3 g/dL (ref 30.0–36.0)
MCV: 103.4 fL — ABNORMAL HIGH (ref 80.0–100.0)
Monocytes Absolute: 0.4 10*3/uL (ref 0.1–1.0)
Monocytes Relative: 7 %
Neutro Abs: 3.6 10*3/uL (ref 1.7–7.7)
Neutrophils Relative %: 73 %
Platelets: 178 10*3/uL (ref 150–400)
RBC: 2.93 MIL/uL — ABNORMAL LOW (ref 3.87–5.11)
RDW: 16.2 % — ABNORMAL HIGH (ref 11.5–15.5)
WBC: 4.9 10*3/uL (ref 4.0–10.5)
nRBC: 0 % (ref 0.0–0.2)

## 2022-09-15 LAB — COMPREHENSIVE METABOLIC PANEL
ALT: 14 U/L (ref 0–44)
AST: 15 U/L (ref 15–41)
Albumin: 3.6 g/dL (ref 3.5–5.0)
Alkaline Phosphatase: 89 U/L (ref 38–126)
Anion gap: 8 (ref 5–15)
BUN: 18 mg/dL (ref 8–23)
CO2: 25 mmol/L (ref 22–32)
Calcium: 9.1 mg/dL (ref 8.9–10.3)
Chloride: 109 mmol/L (ref 98–111)
Creatinine, Ser: 1.05 mg/dL — ABNORMAL HIGH (ref 0.44–1.00)
GFR, Estimated: 53 mL/min — ABNORMAL LOW (ref >60)
Glucose, Bld: 141 mg/dL — ABNORMAL HIGH (ref 70–99)
Potassium: 3.3 mmol/L — ABNORMAL LOW (ref 3.5–5.1)
Sodium: 142 mmol/L (ref 135–145)
Total Bilirubin: 1.1 mg/dL (ref 0.3–1.2)
Total Protein: 6.5 g/dL (ref 6.5–8.1)

## 2022-09-15 LAB — SAMPLE TO BLOOD BANK

## 2022-09-15 LAB — MAGNESIUM: Magnesium: 1.8 mg/dL (ref 1.7–2.4)

## 2022-09-15 MED ORDER — POTASSIUM CHLORIDE CRYS ER 20 MEQ PO TBCR
40.0000 meq | EXTENDED_RELEASE_TABLET | Freq: Once | ORAL | Status: AC
  Administered 2022-09-15: 40 meq via ORAL
  Filled 2022-09-15: qty 2

## 2022-09-15 MED ORDER — PROCHLORPERAZINE MALEATE 10 MG PO TABS
10.0000 mg | ORAL_TABLET | Freq: Once | ORAL | Status: AC
  Administered 2022-09-15: 10 mg via ORAL
  Filled 2022-09-15: qty 1

## 2022-09-15 MED ORDER — CYANOCOBALAMIN 1000 MCG/ML IJ SOLN
1000.0000 ug | Freq: Once | INTRAMUSCULAR | Status: AC
  Administered 2022-09-15: 1000 ug via INTRAMUSCULAR
  Filled 2022-09-15: qty 1

## 2022-09-15 MED ORDER — DEXAMETHASONE 4 MG PO TABS
20.0000 mg | ORAL_TABLET | Freq: Once | ORAL | Status: AC
  Administered 2022-09-15: 20 mg via ORAL
  Filled 2022-09-15: qty 5

## 2022-09-15 MED ORDER — EPOETIN ALFA-EPBX 20000 UNIT/ML IJ SOLN
20000.0000 [IU] | Freq: Once | INTRAMUSCULAR | Status: AC
  Administered 2022-09-15: 20000 [IU] via SUBCUTANEOUS
  Filled 2022-09-15: qty 1

## 2022-09-15 MED ORDER — BORTEZOMIB CHEMO SQ INJECTION 3.5 MG (2.5MG/ML)
1.3000 mg/m2 | Freq: Once | INTRAMUSCULAR | Status: AC
  Administered 2022-09-15: 2.25 mg via SUBCUTANEOUS
  Filled 2022-09-15: qty 0.9

## 2022-09-15 NOTE — Progress Notes (Signed)
Labs reviewed, ok to treat per parameters today. No new complaints from patient today as well.   Treatment given per orders. Patient tolerated it well without problems. Vitals stable and discharged home from clinic ambulatory. Follow up as scheduled.

## 2022-09-15 NOTE — Patient Instructions (Signed)
[facility]  Discharge Instructions: Thank you for choosing [facility] to provide your oncology and hematology care.  If you have a lab appointment with the [facility], please come in thru the Main Entrance and check in at the main information desk.  Wear comfortable clothing and clothing appropriate for easy access to any Portacath or PICC line.   We strive to give you quality time with your provider. You may need to reschedule your appointment if you arrive late (15 or more minutes).  Arriving late affects you and other patients whose appointments are after yours.  Also, if you miss three or more appointments without notifying the office, you may be dismissed from the clinic at the provider's discretion.      For prescription refill requests, have your pharmacy contact our office and allow 72 hours for refills to be completed.    Today you received the following chemotherapy, velcade. Also received B12, retacrit injections       To help prevent nausea and vomiting after your treatment, we encourage you to take your nausea medication as directed.  BELOW ARE SYMPTOMS THAT SHOULD BE REPORTED IMMEDIATELY: *FEVER GREATER THAN 100.4 F (38 C) OR HIGHER *CHILLS OR SWEATING *NAUSEA AND VOMITING THAT IS NOT CONTROLLED WITH YOUR NAUSEA MEDICATION *UNUSUAL SHORTNESS OF BREATH *UNUSUAL BRUISING OR BLEEDING *URINARY PROBLEMS (pain or burning when urinating, or frequent urination) *BOWEL PROBLEMS (unusual diarrhea, constipation, pain near the anus) TENDERNESS IN MOUTH AND THROAT WITH OR WITHOUT PRESENCE OF ULCERS (sore throat, sores in mouth, or a toothache) UNUSUAL RASH, SWELLING OR PAIN  UNUSUAL VAGINAL DISCHARGE OR ITCHING   Items with * indicate a potential emergency and should be followed up as soon as possible or go to the Emergency Department if any problems should occur.  Please show the CHEMOTHERAPY ALERT CARD or IMMUNOTHERAPY ALERT CARD at check-in  to the Emergency Department and triage nurse.  Should you have questions after your visit or need to cancel or reschedule your appointment, please contact [facility] [phone]  and follow the prompts.  Office hours are 8:00 a.m. to 4:30 p.m. Monday - Friday. Please note that voicemails left after 4:00 p.m. may not be returned until the following business day.  We are closed weekends and major holidays. You have access to a nurse at all times for urgent questions. Please call the main number to the clinic [phone] and follow the prompts.  For any non-urgent questions, you may also contact your provider using MyChart. We now offer e-Visits for anyone 42 and older to request care online for non-urgent symptoms. For details visit [email or web address].   Also download the MyChart app! Go to the app store, search "MyChart", open the app, select [facility], and log in with your MyChart username and password.  Masks are optional in the cancer centers. If you would like for your care team to wear a mask while they are taking care of you, please let them know. You may have one support person who is at least 82 years old accompany you for your appointments.

## 2022-09-16 LAB — KAPPA/LAMBDA LIGHT CHAINS
Kappa free light chain: 47.5 mg/L — ABNORMAL HIGH (ref 3.3–19.4)
Kappa, lambda light chain ratio: 3.42 — ABNORMAL HIGH (ref 0.26–1.65)
Lambda free light chains: 13.9 mg/L (ref 5.7–26.3)

## 2022-09-18 LAB — PROTEIN ELECTROPHORESIS, SERUM
A/G Ratio: 1.5 (ref 0.7–1.7)
Albumin ELP: 3.7 g/dL (ref 2.9–4.4)
Alpha-1-Globulin: 0.2 g/dL (ref 0.0–0.4)
Alpha-2-Globulin: 0.6 g/dL (ref 0.4–1.0)
Beta Globulin: 0.9 g/dL (ref 0.7–1.3)
Gamma Globulin: 0.8 g/dL (ref 0.4–1.8)
Globulin, Total: 2.5 g/dL (ref 2.2–3.9)
Total Protein ELP: 6.2 g/dL (ref 6.0–8.5)

## 2022-09-21 LAB — IMMUNOFIXATION ELECTROPHORESIS
IgA: 323 mg/dL (ref 64–422)
IgG (Immunoglobin G), Serum: 754 mg/dL (ref 586–1602)
IgM (Immunoglobulin M), Srm: 101 mg/dL (ref 26–217)
Total Protein ELP: 6.1 g/dL (ref 6.0–8.5)

## 2022-09-28 ENCOUNTER — Other Ambulatory Visit: Payer: Self-pay

## 2022-09-28 DIAGNOSIS — D649 Anemia, unspecified: Secondary | ICD-10-CM

## 2022-09-28 DIAGNOSIS — C9000 Multiple myeloma not having achieved remission: Secondary | ICD-10-CM

## 2022-09-29 ENCOUNTER — Inpatient Hospital Stay (HOSPITAL_BASED_OUTPATIENT_CLINIC_OR_DEPARTMENT_OTHER): Payer: Medicare Other | Admitting: Hematology

## 2022-09-29 ENCOUNTER — Inpatient Hospital Stay: Payer: Medicare Other

## 2022-09-29 ENCOUNTER — Inpatient Hospital Stay: Payer: Medicare Other | Attending: Hematology

## 2022-09-29 ENCOUNTER — Encounter: Payer: Self-pay | Admitting: Hematology

## 2022-09-29 ENCOUNTER — Inpatient Hospital Stay: Payer: Medicare Other | Admitting: Hematology

## 2022-09-29 DIAGNOSIS — E538 Deficiency of other specified B group vitamins: Secondary | ICD-10-CM

## 2022-09-29 DIAGNOSIS — C9000 Multiple myeloma not having achieved remission: Secondary | ICD-10-CM

## 2022-09-29 DIAGNOSIS — Z79899 Other long term (current) drug therapy: Secondary | ICD-10-CM | POA: Insufficient documentation

## 2022-09-29 DIAGNOSIS — Z5112 Encounter for antineoplastic immunotherapy: Secondary | ICD-10-CM | POA: Insufficient documentation

## 2022-09-29 DIAGNOSIS — E876 Hypokalemia: Secondary | ICD-10-CM | POA: Insufficient documentation

## 2022-09-29 DIAGNOSIS — D649 Anemia, unspecified: Secondary | ICD-10-CM

## 2022-09-29 LAB — CBC WITH DIFFERENTIAL/PLATELET
Abs Immature Granulocytes: 0.01 10*3/uL (ref 0.00–0.07)
Basophils Absolute: 0 10*3/uL (ref 0.0–0.1)
Basophils Relative: 1 %
Eosinophils Absolute: 0.1 10*3/uL (ref 0.0–0.5)
Eosinophils Relative: 1 %
HCT: 34.3 % — ABNORMAL LOW (ref 36.0–46.0)
Hemoglobin: 11.2 g/dL — ABNORMAL LOW (ref 12.0–15.0)
Immature Granulocytes: 0 %
Lymphocytes Relative: 18 %
Lymphs Abs: 0.9 10*3/uL (ref 0.7–4.0)
MCH: 33.2 pg (ref 26.0–34.0)
MCHC: 32.7 g/dL (ref 30.0–36.0)
MCV: 101.8 fL — ABNORMAL HIGH (ref 80.0–100.0)
Monocytes Absolute: 0.4 10*3/uL (ref 0.1–1.0)
Monocytes Relative: 7 %
Neutro Abs: 3.9 10*3/uL (ref 1.7–7.7)
Neutrophils Relative %: 73 %
Platelets: 164 10*3/uL (ref 150–400)
RBC: 3.37 MIL/uL — ABNORMAL LOW (ref 3.87–5.11)
RDW: 15.1 % (ref 11.5–15.5)
WBC: 5.3 10*3/uL (ref 4.0–10.5)
nRBC: 0 % (ref 0.0–0.2)

## 2022-09-29 LAB — COMPREHENSIVE METABOLIC PANEL
ALT: 11 U/L (ref 0–44)
AST: 18 U/L (ref 15–41)
Albumin: 3.5 g/dL (ref 3.5–5.0)
Alkaline Phosphatase: 106 U/L (ref 38–126)
Anion gap: 10 (ref 5–15)
BUN: 16 mg/dL (ref 8–23)
CO2: 26 mmol/L (ref 22–32)
Calcium: 9.4 mg/dL (ref 8.9–10.3)
Chloride: 105 mmol/L (ref 98–111)
Creatinine, Ser: 1.08 mg/dL — ABNORMAL HIGH (ref 0.44–1.00)
GFR, Estimated: 51 mL/min — ABNORMAL LOW (ref >60)
Glucose, Bld: 244 mg/dL — ABNORMAL HIGH (ref 70–99)
Potassium: 2.8 mmol/L — ABNORMAL LOW (ref 3.5–5.1)
Sodium: 141 mmol/L (ref 135–145)
Total Bilirubin: 1 mg/dL (ref 0.3–1.2)
Total Protein: 6.5 g/dL (ref 6.5–8.1)

## 2022-09-29 LAB — MAGNESIUM: Magnesium: 1.6 mg/dL — ABNORMAL LOW (ref 1.7–2.4)

## 2022-09-29 LAB — SAMPLE TO BLOOD BANK

## 2022-09-29 MED ORDER — POTASSIUM CHLORIDE CRYS ER 20 MEQ PO TBCR
40.0000 meq | EXTENDED_RELEASE_TABLET | Freq: Once | ORAL | Status: AC
  Administered 2022-09-29: 40 meq via ORAL
  Filled 2022-09-29: qty 2

## 2022-09-29 MED ORDER — MAGNESIUM SULFATE 2 GM/50ML IV SOLN
2.0000 g | Freq: Once | INTRAVENOUS | Status: AC
  Administered 2022-09-29: 2 g via INTRAVENOUS
  Filled 2022-09-29: qty 50

## 2022-09-29 MED ORDER — PROCHLORPERAZINE MALEATE 10 MG PO TABS
10.0000 mg | ORAL_TABLET | Freq: Once | ORAL | Status: AC
  Administered 2022-09-29: 10 mg via ORAL
  Filled 2022-09-29: qty 1

## 2022-09-29 MED ORDER — BORTEZOMIB CHEMO SQ INJECTION 3.5 MG (2.5MG/ML)
1.3000 mg/m2 | Freq: Once | INTRAMUSCULAR | Status: AC
  Administered 2022-09-29: 2.25 mg via SUBCUTANEOUS
  Filled 2022-09-29: qty 0.9

## 2022-09-29 MED ORDER — POTASSIUM CHLORIDE IN NACL 20-0.9 MEQ/L-% IV SOLN
Freq: Once | INTRAVENOUS | Status: AC
  Filled 2022-09-29: qty 1000

## 2022-09-29 MED ORDER — DEXAMETHASONE 4 MG PO TABS
20.0000 mg | ORAL_TABLET | Freq: Once | ORAL | Status: AC
  Administered 2022-09-29: 20 mg via ORAL
  Filled 2022-09-29: qty 5

## 2022-09-29 NOTE — Patient Instructions (Signed)
[facility] at [facility] Discharge Instructions   You were seen and examined today by Dr. Delton Coombes.  He reviewed the results of you lab work which are normal/stable. You potassium and magnesium results are pending.   We will proceed with your treatment today.   Return as scheduled.    Thank you for choosing [facility] at [facility] to provide your oncology and hematology care.  To afford each patient quality time with our provider, please arrive at least 15 minutes before your scheduled appointment time.   If you have a lab appointment with the [facility] please come in thru the Main Entrance and check in at the main information desk.  You need to re-schedule your appointment should you arrive 10 or more minutes late.  We strive to give you quality time with our providers, and arriving late affects you and other patients whose appointments are after yours.  Also, if you no show three or more times for appointments you may be dismissed from the clinic at the providers discretion.     Again, thank you for choosing [facility].  Our hope is that these requests will decrease the amount of time that you wait before being seen by our physicians.       _____________________________________________________________  Should you have questions after your visit to [facility], please contact our office at [phone] and follow the prompts.  Our office hours are 8:00 a.m. and 4:30 p.m. Monday - Friday.  Please note that voicemails left after 4:00 p.m. may not be returned until the following business day.  We are closed weekends and major holidays.  You do have access to a nurse 24-7, just call the main number to the clinic [phone] and do not press any options, hold on the line and a nurse will answer the phone.    For prescription refill requests, have your pharmacy contact our office and allow 72 hours.    Due to  Covid, you will need to wear a mask upon entering the hospital. If you do not have a mask, a mask will be given to you at the Main Entrance upon arrival. For doctor visits, patients may have 1 support person age 22 or older with them. For treatment visits, patients can not have anyone with them due to social distancing guidelines and our immunocompromised population.

## 2022-09-29 NOTE — Progress Notes (Signed)
[facility] [address], [place] [address]   CLINIC:  Medical Oncology/Hematology  PCP:  [name], MD [address] / [place] [address] [phone]   REASON FOR VISIT:  Follow-up for multiple myeloma  PRIOR THERAPY: none  NGS Results: not done  CURRENT THERAPY: Maintenance Velcade every other week.  BRIEF ONCOLOGIC HISTORY:  Oncology History  Multiple myeloma not having achieved remission ([place])  01/20/2018 Initial Diagnosis   Multiple myeloma not having achieved remission ([place])   01/26/2018 - 09/01/2022 Chemotherapy   Patient is on Treatment Plan : MYELOMA  RVD SQ (Bortezomib d 1,8,15 ) q28d x 4 cycles     04/13/2022 -  Chemotherapy   Patient is on Treatment Plan : MYELOMA MAINTENANCE Bortezomib SQ q14d       CANCER STAGING:  Cancer Staging  No matching staging information was found for the patient.  INTERVAL HISTORY:  [name], a 81 y.o. female, seen in the clinic for follow-up of multiple myeloma and toxicity assessment.  She is tolerating Velcade every other week reasonably well.  Denies any tingling or numbness in extremities.  Denied any diarrhea, nausea or vomiting.  No fevers or infections reported in the last 2 months.  She reports that she has been taking potassium and mag same as prescribed.  REVIEW OF SYSTEMS:  Review of Systems  All other systems reviewed and are negative.   PAST MEDICAL/SURGICAL HISTORY:  Past Medical History:  Diagnosis Date   Breast cancer ([place])    left breast/ 2008/ surg/ rad tx   Coronary artery disease    Diabetes mellitus    Past Surgical History:  Procedure Laterality Date   ABDOMINAL HYSTERECTOMY     BREAST SURGERY     DEBRIDEMENT MANDIBLE N/A 02/22/2020   Procedure: INCISION AND DRAINAGE WITH DEBRIDEMENT MANDIBLE;  Surgeon: Michael Litter, DMD;  Location: [facility];  Service: Oral Surgery;  Laterality: N/A;   DEBRIDEMENT MANDIBLE Right 03/19/2021   Procedure: DEBRIDEMENT OF  BONE RIGHT INTERIOR  MANDIBLE;  Surgeon: Michael Litter, DMD;  Location: [facility];  Service: Oral Surgery;  Laterality: Right;   EYE SURGERY  2021   cataract removals    TOOTH EXTRACTION N/A 02/22/2020   Procedure: DENTAL RESTORATION/EXTRACTIONS;  Surgeon: Michael Litter, DMD;  Location: [facility];  Service: Oral Surgery;  Laterality: N/A;  DENTAL KIT REQUESTED    SOCIAL HISTORY:  Social History   Socioeconomic History   Marital status: Divorced    Spouse name: Not on file   Number of children: Not on file   Years of education: Not on file   Highest education level: Not on file  Occupational History   Not on file  Tobacco Use   Smoking status: Never   Smokeless tobacco: Never  Vaping Use   Vaping Use: Never used  Substance and Sexual Activity   Alcohol use: No   Drug use: No   Sexual activity: Yes    Birth control/protection: Surgical  Other Topics Concern   Not on file  Social History Narrative   Not on file   Social Determinants of Health   Financial Resource Strain: Low Risk  (12/09/2020)   Overall Financial Resource Strain (CARDIA)    Difficulty of Paying Living Expenses: Not hard at all  Food Insecurity: No Food Insecurity (12/09/2020)   Hunger Vital Sign    Worried About Running Out of Food in the Last Year: Never true    Ran Out  of Food in the Last Year: Never true  Transportation Needs: No Transportation Needs (12/09/2020)   PRAPARE - Hydrologist (Medical): No    Lack of Transportation (Non-Medical): No  Physical Activity: Inactive (12/09/2020)   Exercise Vital Sign    Days of Exercise per Week: 0 days    Minutes of Exercise per Session: 0 min  Stress: No Stress Concern Present (12/09/2020)   [place]    Feeling of Stress : Not at all  Social Connections: Moderately Isolated (12/09/2020)   Social Connection and Isolation Panel [NHANES]    Frequency of Communication with  Friends and Family: More than three times a week    Frequency of Social Gatherings with Friends and Family: More than three times a week    Attends Religious Services: More than 4 times per year    Active Member of [employer] or Organizations: No    Attends Archivist Meetings: Never    Marital Status: Divorced  Human resources officer Violence: Not At Risk (12/09/2020)   Humiliation, Afraid, Rape, and Kick questionnaire    Fear of Current or Ex-Partner: No    Emotionally Abused: No    Physically Abused: No    Sexually Abused: No    FAMILY HISTORY:  Family History  Problem Relation Age of Onset   Obesity Sister     CURRENT MEDICATIONS:  Current Outpatient Medications  Medication Sig Dispense Refill   aspirin 81 MG tablet Take 1 tablet (81 mg total) by mouth daily with breakfast. 30 tablet 5   bortezomib IV (VELCADE) 3.5 MG injection Inject 3.5 mg into the vein once a week. weekly     chlorhexidine (PERIDEX) 0.12 % solution Use as directed 15 mLs in the mouth or throat 3 (three) times daily.     cholecalciferol (VITAMIN D) 25 MCG (1000 UNIT) tablet 1 capsule     glucose blood (ACCU-CHEK AVIVA PLUS) test strip CHECK BLOOD SUGAR ONCE DAILY     GNP ASPIRIN LOW DOSE 81 MG tablet Take 81 mg by mouth daily.     magnesium oxide (MAG-OX) 400 (240 Mg) MG tablet TAKE (1) TABLET BY MOUTH THREE TIMES DAILY, MORNING, NOON AND BEDTIME. 84 tablet 0   mirtazapine (REMERON) 15 MG tablet Take 0.5 tablets (7.5 mg total) by mouth at bedtime. For appetite stimulation 30 tablet 2   ondansetron (ZOFRAN) 8 MG tablet      Potassium Acetate POWD Take 20 mEq by mouth daily. 12000 g 5   potassium chloride (KLOR-CON) 20 MEQ packet Take 40 mEq by mouth daily. 60 packet 3   REVLIMID 20 MG capsule Take by mouth.     No current facility-administered medications for this visit.   Facility-Administered Medications Ordered in Other Visits  Medication Dose Route Frequency Provider Last Rate Last Admin   bortezomib  SQ (VELCADE) chemo injection (2.68m/mL concentration) 2.25 mg  1.3 mg/m2 (Treatment Plan Recorded) Subcutaneous Once KDerek Jack MD       potassium chloride SA (KLOR-CON M) CR tablet 40 mEq  40 mEq Oral Once KDerek Jack MD        ALLERGIES:  Allergies  Allergen Reactions   Other Other (See Comments)   Seasonal Ic [Cholestatin] Other (See Comments)    Sneezing, watery eyes   Motrin [Ibuprofen] Rash    PHYSICAL EXAM:  Performance status (ECOG): 1 - Symptomatic but completely ambulatory  Vitals:   09/29/22 0958  BP: (Marland Kitchen  163/71  Pulse: 75  Resp: 18  Temp: 98.6 F (37 C)  SpO2: 99%   Wt Readings from Last 3 Encounters:  09/29/22 163 lb (73.9 kg)  09/15/22 165 lb 9.1 oz (75.1 kg)  09/01/22 160 lb 15 oz (73 kg)   Physical Exam Vitals reviewed.  Constitutional:      Appearance: Normal appearance.  Cardiovascular:     Rate and Rhythm: Normal rate and regular rhythm.     Pulses: Normal pulses.     Heart sounds: Normal heart sounds.  Pulmonary:     Effort: Pulmonary effort is normal.     Breath sounds: Normal breath sounds.  Neurological:     General: No focal deficit present.     Mental Status: She is alert and oriented to person, place, and time.  Psychiatric:        Mood and Affect: Mood normal.        Behavior: Behavior normal.     LABORATORY DATA:  I have reviewed the labs as listed.     Latest Ref Rng & Units 09/29/2022    9:38 AM 09/15/2022   10:09 AM 09/01/2022    9:59 AM  CBC  WBC 4.0 - 10.5 K/uL 5.3  4.9  4.3   Hemoglobin 12.0 - 15.0 g/dL 11.2  9.8  9.4   Hematocrit 36.0 - 46.0 % 34.3  30.3  29.4   Platelets 150 - 400 K/uL 164  178  163       Latest Ref Rng & Units 09/29/2022    9:38 AM 09/15/2022   10:09 AM 09/01/2022    9:59 AM  CMP  Glucose 70 - 99 mg/dL 244  141  203   BUN 8 - 23 mg/dL '16  18  14   ' Creatinine 0.44 - 1.00 mg/dL 1.08  1.05  1.11   Sodium 135 - 145 mmol/L 141  142  142   Potassium 3.5 - 5.1 mmol/L 2.8  3.3  3.0    Chloride 98 - 111 mmol/L 105  109  116   CO2 22 - 32 mmol/L '26  25  20   ' Calcium 8.9 - 10.3 mg/dL 9.4  9.1  8.9   Total Protein 6.5 - 8.1 g/dL 6.5  6.5  6.1   Total Bilirubin 0.3 - 1.2 mg/dL 1.0  1.1  0.6   Alkaline Phos 38 - 126 U/L 106  89  100   AST 15 - 41 U/L '18  15  14   ' ALT 0 - 44 U/L '11  14  11     ' DIAGNOSTIC IMAGING:  I have independently reviewed the scans and discussed with the patient. No results found.   ASSESSMENT:  1.  IgA kappa plasma cell myeloma, stage I: -RVD started on 01/09/2018, held since 02/14/2020 due to mandible abscess. -Myeloma labs on 05/14/2020 showed progression with M spike of 0.5 g. -RVD started back on 05/21/2020. -Myeloma labs on 07/10/2020 shows M spike improved to 0.3 g from 0.6 g previously.  Free light chain ratio is 1.74 with kappa light chains 28.4. -Myeloma panel from 08/14/2020 shows M spike 0.4 g.  Kappa light chains are 24.8 and ratio is 2.23. - Revlimid (20 mg 2 weeks on/1 week off) and Velcade (3 weeks on/1 week off) were held after 02/26/2022 due to weight loss. - Velcade maintenance every 2 weeks started on 04/13/2022.   2.  Osteomyelitis of the right mandible/dental abscess: -Finished IV ceftriaxone on 04/03/2020.  Finished oral  antibiotics. -We will hold Xgeva indefinitely.   PLAN:  1.  IgA kappa plasma cell myeloma, stage I: - She is tolerating Velcade every other week reasonably well. - I have reviewed myeloma panel from 09/15/2022 which showed M spike was not detected.  Free light chain ratio is 3.42 with kappa light chains stable at 47.3.  Immunofixation shows IgA kappa. - Reviewed labs from today which showed normal CBC and LFTs.  Creatinine is mildly elevated at 1.08 and stable. - I would continue Velcade maintenance every 2 weeks.  RTC 8 weeks for follow-up with myeloma labs.   2.  Severe hypokalemia: - Continue potassium 3 times daily at home.  She is noncompliant in taking potassium and magnesium supplements.  Potassium today is  2.8.  She will receive oral and IV potassium today.   3.  Osteomyelitis of the right mandible/dental abscess: -Bisphosphonates on hold indefinitely due to ONJ.   4.  Hypomagnesemia: - Continue magnesium 3 times daily.  Magnesium is 1.6 today.   5.  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 - Last Feraheme on 09/01/2022.  She received Retacrit 20,000 units on 09/15/2022.  Today her hemoglobin improved to 11.2.  Continue Retacrit as needed.   Orders placed this encounter:  No orders of the defined types were placed in this encounter.    Derek Jack, MD [facility] [phone]

## 2022-09-29 NOTE — Progress Notes (Signed)
Patient and labs assessed by Dr. Delton Coombes. Patient okay for Velcade with pending CMP, pharmacy aware. Potassium 2.8 and Magnesium 1.6, Per Dr. Delton Coombes give 40 mEq PO potassium, 20 mEq IV potassium, 40 mEq PO potassium and 2g of IV Mag. Patient tolerated Velcade injection with no complaints voiced. Lab work reviewed. See MAR for details. Injection site clean and dry with no bruising or swelling noted. Patient stable during and after injection. Band aid applied.  Patient tolerated therapy with no complaints voiced. Side effects with management reviewed with understanding verbalized. IV site clean and dry with no bruising or swelling noted at site. Good blood return noted before and after administration of therapy. Band aid applied. Patient left in satisfactory condition with VSS and no s/s of distress noted.

## 2022-09-29 NOTE — Progress Notes (Signed)
Potassium 2.8 . Standing orders 40 mEq potassium  PO pre and post 20 mEq IV potassium.

## 2022-09-29 NOTE — Patient Instructions (Signed)
[place]  Discharge Instructions: Thank you for choosing [facility] to provide your oncology and hematology care.  If you have a lab appointment with the [facility], please come in thru the Main Entrance and check in at the main information desk.  Wear comfortable clothing and clothing appropriate for easy access to any Portacath or PICC line.   We strive to give you quality time with your provider. You may need to reschedule your appointment if you arrive late (15 or more minutes).  Arriving late affects you and other patients whose appointments are after yours.  Also, if you miss three or more appointments without notifying the office, you may be dismissed from the clinic at the provider's discretion.      For prescription refill requests, have your pharmacy contact our office and allow 72 hours for refills to be completed.    Today you received the following chemotherapy and/or immunotherapy agents Velcade, Potassium and Magnesium, return as scheduled.   To help prevent nausea and vomiting after your treatment, we encourage you to take your nausea medication as directed.  BELOW ARE SYMPTOMS THAT SHOULD BE REPORTED IMMEDIATELY: *FEVER GREATER THAN 100.4 F (38 C) OR HIGHER *CHILLS OR SWEATING *NAUSEA AND VOMITING THAT IS NOT CONTROLLED WITH YOUR NAUSEA MEDICATION *UNUSUAL SHORTNESS OF BREATH *UNUSUAL BRUISING OR BLEEDING *URINARY PROBLEMS (pain or burning when urinating, or frequent urination) *BOWEL PROBLEMS (unusual diarrhea, constipation, pain near the anus) TENDERNESS IN MOUTH AND THROAT WITH OR WITHOUT PRESENCE OF ULCERS (sore throat, sores in mouth, or a toothache) UNUSUAL RASH, SWELLING OR PAIN  UNUSUAL VAGINAL DISCHARGE OR ITCHING   Items with * indicate a potential emergency and should be followed up as soon as possible or go to the Emergency Department if any problems should occur.  Please show the CHEMOTHERAPY ALERT CARD or  IMMUNOTHERAPY ALERT CARD at check-in to the Emergency Department and triage nurse.  Should you have questions after your visit or need to cancel or reschedule your appointment, please contact [facility] [phone]  and follow the prompts.  Office hours are 8:00 a.m. to 4:30 p.m. Monday - Friday. Please note that voicemails left after 4:00 p.m. may not be returned until the following business day.  We are closed weekends and major holidays. You have access to a nurse at all times for urgent questions. Please call the main number to the clinic [phone] and follow the prompts.  For any non-urgent questions, you may also contact your provider using MyChart. We now offer e-Visits for anyone 70 and older to request care online for non-urgent symptoms. For details visit [email or web address].   Also download the MyChart app! Go to the app store, search "MyChart", open the app, select [facility], and log in with your MyChart username and password.  Masks are optional in the cancer centers. If you would like for your care team to wear a mask while they are taking care of you, please let them know. You may have one support person who is at least 82 years old accompany you for your appointments.

## 2022-09-30 ENCOUNTER — Other Ambulatory Visit: Payer: Self-pay

## 2022-10-01 ENCOUNTER — Other Ambulatory Visit: Payer: Self-pay

## 2022-10-12 ENCOUNTER — Inpatient Hospital Stay: Payer: Medicare Other

## 2022-10-12 VITALS — BP 180/74 | HR 64 | Temp 97.5°F | Resp 16 | Wt 162.0 lb

## 2022-10-12 DIAGNOSIS — C9000 Multiple myeloma not having achieved remission: Secondary | ICD-10-CM

## 2022-10-12 DIAGNOSIS — Z5112 Encounter for antineoplastic immunotherapy: Secondary | ICD-10-CM | POA: Diagnosis not present

## 2022-10-12 DIAGNOSIS — E538 Deficiency of other specified B group vitamins: Secondary | ICD-10-CM

## 2022-10-12 LAB — CBC WITH DIFFERENTIAL/PLATELET
Abs Immature Granulocytes: 0.01 10*3/uL (ref 0.00–0.07)
Basophils Absolute: 0 10*3/uL (ref 0.0–0.1)
Basophils Relative: 0 %
Eosinophils Absolute: 0.1 10*3/uL (ref 0.0–0.5)
Eosinophils Relative: 1 %
HCT: 35.6 % — ABNORMAL LOW (ref 36.0–46.0)
Hemoglobin: 11.7 g/dL — ABNORMAL LOW (ref 12.0–15.0)
Immature Granulocytes: 0 %
Lymphocytes Relative: 20 %
Lymphs Abs: 0.9 10*3/uL (ref 0.7–4.0)
MCH: 34.1 pg — ABNORMAL HIGH (ref 26.0–34.0)
MCHC: 32.9 g/dL (ref 30.0–36.0)
MCV: 103.8 fL — ABNORMAL HIGH (ref 80.0–100.0)
Monocytes Absolute: 0.3 10*3/uL (ref 0.1–1.0)
Monocytes Relative: 7 %
Neutro Abs: 3.3 10*3/uL (ref 1.7–7.7)
Neutrophils Relative %: 72 %
Platelets: 194 10*3/uL (ref 150–400)
RBC: 3.43 MIL/uL — ABNORMAL LOW (ref 3.87–5.11)
RDW: 14.6 % (ref 11.5–15.5)
WBC: 4.6 10*3/uL (ref 4.0–10.5)
nRBC: 0 % (ref 0.0–0.2)

## 2022-10-12 LAB — COMPREHENSIVE METABOLIC PANEL
ALT: 12 U/L (ref 0–44)
AST: 16 U/L (ref 15–41)
Albumin: 3.7 g/dL (ref 3.5–5.0)
Alkaline Phosphatase: 113 U/L (ref 38–126)
Anion gap: 9 (ref 5–15)
BUN: 16 mg/dL (ref 8–23)
CO2: 28 mmol/L (ref 22–32)
Calcium: 10.1 mg/dL (ref 8.9–10.3)
Chloride: 104 mmol/L (ref 98–111)
Creatinine, Ser: 1.07 mg/dL — ABNORMAL HIGH (ref 0.44–1.00)
GFR, Estimated: 52 mL/min — ABNORMAL LOW (ref >60)
Glucose, Bld: 186 mg/dL — ABNORMAL HIGH (ref 70–99)
Potassium: 4.2 mmol/L (ref 3.5–5.1)
Sodium: 141 mmol/L (ref 135–145)
Total Bilirubin: 0.5 mg/dL (ref 0.3–1.2)
Total Protein: 7.4 g/dL (ref 6.5–8.1)

## 2022-10-12 LAB — MAGNESIUM: Magnesium: 1.7 mg/dL (ref 1.7–2.4)

## 2022-10-12 MED ORDER — PROCHLORPERAZINE MALEATE 10 MG PO TABS
10.0000 mg | ORAL_TABLET | Freq: Once | ORAL | Status: AC
  Administered 2022-10-12: 10 mg via ORAL
  Filled 2022-10-12: qty 1

## 2022-10-12 MED ORDER — CYANOCOBALAMIN 1000 MCG/ML IJ SOLN
1000.0000 ug | Freq: Once | INTRAMUSCULAR | Status: AC
  Administered 2022-10-12: 1000 ug via INTRAMUSCULAR
  Filled 2022-10-12: qty 1

## 2022-10-12 MED ORDER — DEXAMETHASONE 4 MG PO TABS
20.0000 mg | ORAL_TABLET | Freq: Once | ORAL | Status: AC
  Administered 2022-10-12: 20 mg via ORAL
  Filled 2022-10-12: qty 5

## 2022-10-12 MED ORDER — BORTEZOMIB CHEMO SQ INJECTION 3.5 MG (2.5MG/ML)
1.3000 mg/m2 | Freq: Once | INTRAMUSCULAR | Status: AC
  Administered 2022-10-12: 2.25 mg via SUBCUTANEOUS
  Filled 2022-10-12: qty 0.9

## 2022-10-12 NOTE — Progress Notes (Signed)
Labs reviewed and meet parameters for treatment today. Will proceed as planned  Treatment given per orders. Patient tolerated it well without problems. Vitals stable and discharged home from clinic ambulatory. Follow up as scheduled.  

## 2022-10-12 NOTE — Patient Instructions (Signed)
[facility]  Discharge Instructions: Thank you for choosing [facility] to provide your oncology and hematology care.  If you have a lab appointment with the [facility], please come in thru the Main Entrance and check in at the main information desk.  Wear comfortable clothing and clothing appropriate for easy access to any Portacath or PICC line.   We strive to give you quality time with your provider. You may need to reschedule your appointment if you arrive late (15 or more minutes).  Arriving late affects you and other patients whose appointments are after yours.  Also, if you miss three or more appointments without notifying the office, you may be dismissed from the clinic at the provider's discretion.      For prescription refill requests, have your pharmacy contact our office and allow 72 hours for refills to be completed.    Today you received the following chemotherapy, velcade injection, B12 injection      To help prevent nausea and vomiting after your treatment, we encourage you to take your nausea medication as directed.  BELOW ARE SYMPTOMS THAT SHOULD BE REPORTED IMMEDIATELY: *FEVER GREATER THAN 100.4 F (38 C) OR HIGHER *CHILLS OR SWEATING *NAUSEA AND VOMITING THAT IS NOT CONTROLLED WITH YOUR NAUSEA MEDICATION *UNUSUAL SHORTNESS OF BREATH *UNUSUAL BRUISING OR BLEEDING *URINARY PROBLEMS (pain or burning when urinating, or frequent urination) *BOWEL PROBLEMS (unusual diarrhea, constipation, pain near the anus) TENDERNESS IN MOUTH AND THROAT WITH OR WITHOUT PRESENCE OF ULCERS (sore throat, sores in mouth, or a toothache) UNUSUAL RASH, SWELLING OR PAIN  UNUSUAL VAGINAL DISCHARGE OR ITCHING   Items with * indicate a potential emergency and should be followed up as soon as possible or go to the Emergency Department if any problems should occur.  Please show the CHEMOTHERAPY ALERT CARD or IMMUNOTHERAPY ALERT CARD at check-in to the Emergency  Department and triage nurse.  Should you have questions after your visit or need to cancel or reschedule your appointment, please contact [facility] [phone]  and follow the prompts.  Office hours are 8:00 a.m. to 4:30 p.m. Monday - Friday. Please note that voicemails left after 4:00 p.m. may not be returned until the following business day.  We are closed weekends and major holidays. You have access to a nurse at all times for urgent questions. Please call the main number to the clinic [phone] and follow the prompts.  For any non-urgent questions, you may also contact your provider using MyChart. We now offer e-Visits for anyone 57 and older to request care online for non-urgent symptoms. For details visit [email or web address].   Also download the MyChart app! Go to the app store, search "MyChart", open the app, select [facility], and log in with your MyChart username and password.  Masks are optional in the cancer centers. If you would like for your care team to wear a mask while they are taking care of you, please let them know. You may have one support person who is at least 82 years old accompany you for your appointments.

## 2022-10-13 ENCOUNTER — Inpatient Hospital Stay: Payer: Medicare Other

## 2022-10-14 ENCOUNTER — Other Ambulatory Visit: Payer: Self-pay

## 2022-10-14 MED ORDER — MAGNESIUM OXIDE -MG SUPPLEMENT 400 (240 MG) MG PO TABS: ORAL_TABLET | ORAL | 0 refills | Status: DC

## 2022-10-16 ENCOUNTER — Other Ambulatory Visit: Payer: Self-pay

## 2022-10-26 ENCOUNTER — Other Ambulatory Visit: Payer: Self-pay

## 2022-10-26 DIAGNOSIS — C9000 Multiple myeloma not having achieved remission: Secondary | ICD-10-CM

## 2022-10-27 ENCOUNTER — Inpatient Hospital Stay: Payer: Medicare Other

## 2022-10-27 DIAGNOSIS — C9000 Multiple myeloma not having achieved remission: Secondary | ICD-10-CM

## 2022-10-27 DIAGNOSIS — E538 Deficiency of other specified B group vitamins: Secondary | ICD-10-CM

## 2022-10-27 DIAGNOSIS — Z5112 Encounter for antineoplastic immunotherapy: Secondary | ICD-10-CM | POA: Diagnosis not present

## 2022-10-27 DIAGNOSIS — E876 Hypokalemia: Secondary | ICD-10-CM

## 2022-10-27 LAB — COMPREHENSIVE METABOLIC PANEL
ALT: 9 U/L (ref 0–44)
AST: 14 U/L — ABNORMAL LOW (ref 15–41)
Albumin: 3.5 g/dL (ref 3.5–5.0)
Alkaline Phosphatase: 131 U/L — ABNORMAL HIGH (ref 38–126)
Anion gap: 10 (ref 5–15)
BUN: 21 mg/dL (ref 8–23)
CO2: 26 mmol/L (ref 22–32)
Calcium: 9.5 mg/dL (ref 8.9–10.3)
Chloride: 104 mmol/L (ref 98–111)
Creatinine, Ser: 1.16 mg/dL — ABNORMAL HIGH (ref 0.44–1.00)
GFR, Estimated: 47 mL/min — ABNORMAL LOW (ref >60)
Glucose, Bld: 265 mg/dL — ABNORMAL HIGH (ref 70–99)
Potassium: 3.1 mmol/L — ABNORMAL LOW (ref 3.5–5.1)
Sodium: 140 mmol/L (ref 135–145)
Total Bilirubin: 0.6 mg/dL (ref 0.3–1.2)
Total Protein: 6.8 g/dL (ref 6.5–8.1)

## 2022-10-27 LAB — CBC WITH DIFFERENTIAL/PLATELET
Abs Immature Granulocytes: 0.03 10*3/uL (ref 0.00–0.07)
Basophils Absolute: 0 10*3/uL (ref 0.0–0.1)
Basophils Relative: 0 %
Eosinophils Absolute: 0 10*3/uL (ref 0.0–0.5)
Eosinophils Relative: 0 %
HCT: 32.8 % — ABNORMAL LOW (ref 36.0–46.0)
Hemoglobin: 10.9 g/dL — ABNORMAL LOW (ref 12.0–15.0)
Immature Granulocytes: 0 %
Lymphocytes Relative: 10 %
Lymphs Abs: 0.9 10*3/uL (ref 0.7–4.0)
MCH: 33.1 pg (ref 26.0–34.0)
MCHC: 33.2 g/dL (ref 30.0–36.0)
MCV: 99.7 fL (ref 80.0–100.0)
Monocytes Absolute: 0.5 10*3/uL (ref 0.1–1.0)
Monocytes Relative: 6 %
Neutro Abs: 7.7 10*3/uL (ref 1.7–7.7)
Neutrophils Relative %: 84 %
Platelets: 170 10*3/uL (ref 150–400)
RBC: 3.29 MIL/uL — ABNORMAL LOW (ref 3.87–5.11)
RDW: 14 % (ref 11.5–15.5)
WBC: 9.2 10*3/uL (ref 4.0–10.5)
nRBC: 0 % (ref 0.0–0.2)

## 2022-10-27 LAB — MAGNESIUM: Magnesium: 1.5 mg/dL — ABNORMAL LOW (ref 1.7–2.4)

## 2022-10-27 MED ORDER — PROCHLORPERAZINE MALEATE 10 MG PO TABS
10.0000 mg | ORAL_TABLET | Freq: Once | ORAL | Status: AC
  Administered 2022-10-27: 10 mg via ORAL
  Filled 2022-10-27: qty 1

## 2022-10-27 MED ORDER — SODIUM CHLORIDE 0.9 % IV SOLN: INTRAVENOUS | Status: DC

## 2022-10-27 MED ORDER — POTASSIUM CHLORIDE CRYS ER 20 MEQ PO TBCR
40.0000 meq | EXTENDED_RELEASE_TABLET | Freq: Once | ORAL | Status: AC
  Administered 2022-10-27: 40 meq via ORAL
  Filled 2022-10-27: qty 2

## 2022-10-27 MED ORDER — EPOETIN ALFA-EPBX 20000 UNIT/ML IJ SOLN: 20000.0000 [IU] | Freq: Once | INTRAMUSCULAR | Status: DC

## 2022-10-27 MED ORDER — MAGNESIUM SULFATE 2 GM/50ML IV SOLN
2.0000 g | Freq: Once | INTRAVENOUS | Status: AC
  Administered 2022-10-27: 2 g via INTRAVENOUS
  Filled 2022-10-27: qty 50

## 2022-10-27 MED ORDER — BORTEZOMIB CHEMO SQ INJECTION 3.5 MG (2.5MG/ML)
1.3000 mg/m2 | Freq: Once | INTRAMUSCULAR | Status: AC
  Administered 2022-10-27: 2.25 mg via SUBCUTANEOUS
  Filled 2022-10-27: qty 0.9

## 2022-10-27 MED ORDER — DEXAMETHASONE 4 MG PO TABS
20.0000 mg | ORAL_TABLET | Freq: Once | ORAL | Status: AC
  Administered 2022-10-27: 20 mg via ORAL
  Filled 2022-10-27: qty 5

## 2022-10-27 NOTE — Patient Instructions (Signed)
[facility]  Discharge Instructions: Thank you for choosing [facility] to provide your oncology and hematology care.  If you have a lab appointment with the [facility], please come in thru the Main Entrance and check in at the main information desk.  Wear comfortable clothing and clothing appropriate for easy access to any Portacath or PICC line.   We strive to give you quality time with your provider. You may need to reschedule your appointment if you arrive late (15 or more minutes).  Arriving late affects you and other patients whose appointments are after yours.  Also, if you miss three or more appointments without notifying the office, you may be dismissed from the clinic at the provider's discretion.      For prescription refill requests, have your pharmacy contact our office and allow 72 hours for refills to be completed.    Today you received the following chemotherapy and/or immunotherapy agents Velcade and 2g of magnesium, return as scheduled.   To help prevent nausea and vomiting after your treatment, we encourage you to take your nausea medication as directed.  BELOW ARE SYMPTOMS THAT SHOULD BE REPORTED IMMEDIATELY: *FEVER GREATER THAN 100.4 F (38 C) OR HIGHER *CHILLS OR SWEATING *NAUSEA AND VOMITING THAT IS NOT CONTROLLED WITH YOUR NAUSEA MEDICATION *UNUSUAL SHORTNESS OF BREATH *UNUSUAL BRUISING OR BLEEDING *URINARY PROBLEMS (pain or burning when urinating, or frequent urination) *BOWEL PROBLEMS (unusual diarrhea, constipation, pain near the anus) TENDERNESS IN MOUTH AND THROAT WITH OR WITHOUT PRESENCE OF ULCERS (sore throat, sores in mouth, or a toothache) UNUSUAL RASH, SWELLING OR PAIN  UNUSUAL VAGINAL DISCHARGE OR ITCHING   Items with * indicate a potential emergency and should be followed up as soon as possible or go to the Emergency Department if any problems should occur.  Please show the CHEMOTHERAPY ALERT CARD or IMMUNOTHERAPY  ALERT CARD at check-in to the Emergency Department and triage nurse.  Should you have questions after your visit or need to cancel or reschedule your appointment, please contact [facility] [phone]  and follow the prompts.  Office hours are 8:00 a.m. to 4:30 p.m. Monday - Friday. Please note that voicemails left after 4:00 p.m. may not be returned until the following business day.  We are closed weekends and major holidays. You have access to a nurse at all times for urgent questions. Please call the main number to the clinic [phone] and follow the prompts.  For any non-urgent questions, you may also contact your provider using MyChart. We now offer e-Visits for anyone 24 and older to request care online for non-urgent symptoms. For details visit [email or web address].   Also download the MyChart app! Go to the app store, search "MyChart", open the app, select [facility], and log in with your MyChart username and password.  Masks are optional in the cancer centers. If you would like for your care team to wear a mask while they are taking care of you, please let them know. You may have one support person who is at least 82 years old accompany you for your appointments.

## 2022-10-27 NOTE — Progress Notes (Signed)
Patient tolerated magnesium infusion with no complaints voiced. Peripheral IV site clean and dry with good blood return noted before and after infusion. Band aid applied.   Patient tolerated Velcade injection with no complaints voiced. Lab work reviewed. See MAR for details. Injection site clean and dry with no bruising or swelling noted. Patient stable during and after injection. Band aid applied. VSS. Patient left in satisfactory condition with no s/s of distress noted.

## 2022-11-03 ENCOUNTER — Other Ambulatory Visit: Payer: Self-pay

## 2022-11-09 ENCOUNTER — Other Ambulatory Visit: Payer: Self-pay | Admitting: *Deleted

## 2022-11-09 MED ORDER — MAGNESIUM OXIDE -MG SUPPLEMENT 400 (240 MG) MG PO TABS: ORAL_TABLET | ORAL | 3 refills | Status: DC

## 2022-11-10 ENCOUNTER — Inpatient Hospital Stay: Payer: Medicare Other

## 2022-11-17 ENCOUNTER — Other Ambulatory Visit: Payer: Self-pay

## 2022-11-17 ENCOUNTER — Inpatient Hospital Stay: Payer: Medicare Other | Attending: Hematology

## 2022-11-17 DIAGNOSIS — C9000 Multiple myeloma not having achieved remission: Secondary | ICD-10-CM | POA: Insufficient documentation

## 2022-11-17 DIAGNOSIS — Z79899 Other long term (current) drug therapy: Secondary | ICD-10-CM | POA: Insufficient documentation

## 2022-11-17 DIAGNOSIS — Z5112 Encounter for antineoplastic immunotherapy: Secondary | ICD-10-CM | POA: Diagnosis not present

## 2022-11-17 DIAGNOSIS — D539 Nutritional anemia, unspecified: Secondary | ICD-10-CM | POA: Insufficient documentation

## 2022-11-17 DIAGNOSIS — E538 Deficiency of other specified B group vitamins: Secondary | ICD-10-CM

## 2022-11-17 LAB — LACTATE DEHYDROGENASE: LDH: 144 U/L (ref 98–192)

## 2022-11-17 LAB — COMPREHENSIVE METABOLIC PANEL WITH GFR
ALT: 10 U/L (ref 0–44)
AST: 16 U/L (ref 15–41)
Albumin: 3.5 g/dL (ref 3.5–5.0)
Alkaline Phosphatase: 97 U/L (ref 38–126)
Anion gap: 10 (ref 5–15)
BUN: 15 mg/dL (ref 8–23)
CO2: 28 mmol/L (ref 22–32)
Calcium: 9.7 mg/dL (ref 8.9–10.3)
Chloride: 101 mmol/L (ref 98–111)
Creatinine, Ser: 1.13 mg/dL — ABNORMAL HIGH (ref 0.44–1.00)
GFR, Estimated: 49 mL/min — ABNORMAL LOW (ref >60)
Glucose, Bld: 180 mg/dL — ABNORMAL HIGH (ref 70–99)
Potassium: 3.6 mmol/L (ref 3.5–5.1)
Sodium: 139 mmol/L (ref 135–145)
Total Bilirubin: 0.7 mg/dL (ref 0.3–1.2)
Total Protein: 7.6 g/dL (ref 6.5–8.1)

## 2022-11-17 LAB — CBC WITH DIFFERENTIAL/PLATELET
Abs Immature Granulocytes: 0.02 10*3/uL (ref 0.00–0.07)
Basophils Absolute: 0 10*3/uL (ref 0.0–0.1)
Basophils Relative: 0 %
Eosinophils Absolute: 0 10*3/uL (ref 0.0–0.5)
Eosinophils Relative: 0 %
HCT: 36.1 % (ref 36.0–46.0)
Hemoglobin: 12 g/dL (ref 12.0–15.0)
Immature Granulocytes: 0 %
Lymphocytes Relative: 13 %
Lymphs Abs: 1 10*3/uL (ref 0.7–4.0)
MCH: 33.7 pg (ref 26.0–34.0)
MCHC: 33.2 g/dL (ref 30.0–36.0)
MCV: 101.4 fL — ABNORMAL HIGH (ref 80.0–100.0)
Monocytes Absolute: 0.6 10*3/uL (ref 0.1–1.0)
Monocytes Relative: 8 %
Neutro Abs: 6.1 10*3/uL (ref 1.7–7.7)
Neutrophils Relative %: 79 %
Platelets: 250 10*3/uL (ref 150–400)
RBC: 3.56 MIL/uL — ABNORMAL LOW (ref 3.87–5.11)
RDW: 14 % (ref 11.5–15.5)
WBC: 7.8 10*3/uL (ref 4.0–10.5)
nRBC: 0 % (ref 0.0–0.2)

## 2022-11-17 LAB — IRON AND TIBC
Iron: 23 ug/dL — ABNORMAL LOW (ref 28–170)
Saturation Ratios: 23 % (ref 10.4–31.8)
TIBC: 100 ug/dL — ABNORMAL LOW (ref 250–450)
UIBC: 77 ug/dL

## 2022-11-17 LAB — MAGNESIUM: Magnesium: 1.9 mg/dL (ref 1.7–2.4)

## 2022-11-17 LAB — FERRITIN: Ferritin: 562 ng/mL — ABNORMAL HIGH (ref 11–307)

## 2022-11-18 LAB — KAPPA/LAMBDA LIGHT CHAINS
Kappa free light chain: 57.6 mg/L — ABNORMAL HIGH (ref 3.3–19.4)
Kappa, lambda light chain ratio: 2.47 — ABNORMAL HIGH (ref 0.26–1.65)
Lambda free light chains: 23.3 mg/L (ref 5.7–26.3)

## 2022-11-23 LAB — PROTEIN ELECTROPHORESIS, SERUM
A/G Ratio: 0.9 (ref 0.7–1.7)
Albumin ELP: 3.4 g/dL (ref 2.9–4.4)
Alpha-1-Globulin: 0.3 g/dL (ref 0.0–0.4)
Alpha-2-Globulin: 0.9 g/dL (ref 0.4–1.0)
Beta Globulin: 1.2 g/dL (ref 0.7–1.3)
Gamma Globulin: 1.1 g/dL (ref 0.4–1.8)
Globulin, Total: 3.6 g/dL (ref 2.2–3.9)
M-Spike, %: 0.3 g/dL — ABNORMAL HIGH
Total Protein ELP: 7 g/dL (ref 6.0–8.5)

## 2022-11-24 ENCOUNTER — Inpatient Hospital Stay: Payer: Medicare Other

## 2022-11-24 ENCOUNTER — Inpatient Hospital Stay (HOSPITAL_BASED_OUTPATIENT_CLINIC_OR_DEPARTMENT_OTHER): Payer: Medicare Other | Admitting: Hematology

## 2022-11-24 DIAGNOSIS — Z5112 Encounter for antineoplastic immunotherapy: Secondary | ICD-10-CM | POA: Diagnosis not present

## 2022-11-24 DIAGNOSIS — E538 Deficiency of other specified B group vitamins: Secondary | ICD-10-CM

## 2022-11-24 DIAGNOSIS — C9000 Multiple myeloma not having achieved remission: Secondary | ICD-10-CM | POA: Diagnosis not present

## 2022-11-24 MED ORDER — PROCHLORPERAZINE MALEATE 10 MG PO TABS
10.0000 mg | ORAL_TABLET | Freq: Once | ORAL | Status: AC
  Administered 2022-11-24: 10 mg via ORAL
  Filled 2022-11-24: qty 1

## 2022-11-24 MED ORDER — BORTEZOMIB CHEMO SQ INJECTION 3.5 MG (2.5MG/ML)
1.3000 mg/m2 | Freq: Once | INTRAMUSCULAR | Status: AC
  Administered 2022-11-24: 2.25 mg via SUBCUTANEOUS
  Filled 2022-11-24: qty 0.9

## 2022-11-24 MED ORDER — DEXAMETHASONE 4 MG PO TABS
20.0000 mg | ORAL_TABLET | Freq: Once | ORAL | Status: AC
  Administered 2022-11-24: 20 mg via ORAL
  Filled 2022-11-24: qty 5

## 2022-11-24 MED ORDER — CYANOCOBALAMIN 1000 MCG/ML IJ SOLN
1000.0000 ug | Freq: Once | INTRAMUSCULAR | Status: AC
  Administered 2022-11-24: 1000 ug via INTRAMUSCULAR
  Filled 2022-11-24: qty 1

## 2022-11-24 NOTE — Patient Instructions (Signed)
[facility]  Discharge Instructions: Thank you for choosing [facility] to provide your oncology and hematology care.  If you have a lab appointment with the [facility], please come in thru the Main Entrance and check in at the main information desk.  Wear comfortable clothing and clothing appropriate for easy access to any Portacath or PICC line.   We strive to give you quality time with your provider. You may need to reschedule your appointment if you arrive late (15 or more minutes).  Arriving late affects you and other patients whose appointments are after yours.  Also, if you miss three or more appointments without notifying the office, you may be dismissed from the clinic at the provider's discretion.      For prescription refill requests, have your pharmacy contact our office and allow 72 hours for refills to be completed.    Today you received the following chemotherapy and/or immunotherapy agents velcade, b12 injection     To help prevent nausea and vomiting after your treatment, we encourage you to take your nausea medication as directed.  BELOW ARE SYMPTOMS THAT SHOULD BE REPORTED IMMEDIATELY: *FEVER GREATER THAN 100.4 F (38 C) OR HIGHER *CHILLS OR SWEATING *NAUSEA AND VOMITING THAT IS NOT CONTROLLED WITH YOUR NAUSEA MEDICATION *UNUSUAL SHORTNESS OF BREATH *UNUSUAL BRUISING OR BLEEDING *URINARY PROBLEMS (pain or burning when urinating, or frequent urination) *BOWEL PROBLEMS (unusual diarrhea, constipation, pain near the anus) TENDERNESS IN MOUTH AND THROAT WITH OR WITHOUT PRESENCE OF ULCERS (sore throat, sores in mouth, or a toothache) UNUSUAL RASH, SWELLING OR PAIN  UNUSUAL VAGINAL DISCHARGE OR ITCHING   Items with * indicate a potential emergency and should be followed up as soon as possible or go to the Emergency Department if any problems should occur.  Please show the CHEMOTHERAPY ALERT CARD or IMMUNOTHERAPY ALERT CARD at check-in to  the Emergency Department and triage nurse.  Should you have questions after your visit or need to cancel or reschedule your appointment, please contact [facility] [phone]  and follow the prompts.  Office hours are 8:00 a.m. to 4:30 p.m. Monday - Friday. Please note that voicemails left after 4:00 p.m. may not be returned until the following business day.  We are closed weekends and major holidays. You have access to a nurse at all times for urgent questions. Please call the main number to the clinic [phone] and follow the prompts.  For any non-urgent questions, you may also contact your provider using MyChart. We now offer e-Visits for anyone 41 and older to request care online for non-urgent symptoms. For details visit [email or web address].   Also download the MyChart app! Go to the app store, search "MyChart", open the app, select [facility], and log in with your MyChart username and password.  Masks are optional in the cancer centers. If you would like for your care team to wear a mask while they are taking care of you, please let them know. You may have one support person who is at least 82 years old accompany you for your appointments.

## 2022-11-24 NOTE — Patient Instructions (Signed)
[facility]  Discharge Instructions  You were seen and examined today by Dr. Katragadda.  Proceed with treatment today as scheduled.  Follow-up as scheduled.  Thank you for choosing [facility] to provide your oncology and hematology care.   To afford each patient quality time with our provider, please arrive at least 15 minutes before your scheduled appointment time. You may need to reschedule your appointment if you arrive late (10 or more minutes). Arriving late affects you and other patients whose appointments are after yours.  Also, if you miss three or more appointments without notifying the office, you may be dismissed from the clinic at the provider's discretion.    Again, thank you for choosing [facility].  Our hope is that these requests will decrease the amount of time that you wait before being seen by our physicians.   If you have a lab appointment with the Cancer Center please come in thru the Main Entrance and check in at the main information desk.           _____________________________________________________________  Should you have questions after your visit to [facility], please contact our office at [phone] and follow the prompts.  Our office hours are 8:00 a.m. to 4:30 p.m. Monday - Thursday and 8:00 a.m. to 2:30 p.m. Friday.  Please note that voicemails left after 4:00 p.m. may not be returned until the following business day.  We are closed weekends and all major holidays.  You do have access to a nurse 24-7, just call the main number to the clinic [phone] and do not press any options, hold on the line and a nurse will answer the phone.    For prescription refill requests, have your pharmacy contact our office and allow 72 hours.    Masks are optional in the cancer centers. If you would like for your care team to wear a mask while they are taking care of you, please let them  know. You may have one support person who is at least 82 years old accompany you for your appointments.  

## 2022-11-24 NOTE — Progress Notes (Signed)
[facility] [address], [place] [address]   CLINIC:  Medical Oncology/Hematology  PCP:  Brittany Richard, MD [address] / [place] [address] [phone]   REASON FOR VISIT:  Follow-up for multiple myeloma  PRIOR THERAPY: none  NGS Results: not done  CURRENT THERAPY: Maintenance Velcade every other week.  BRIEF ONCOLOGIC HISTORY:  Oncology History  Multiple myeloma not having achieved remission ([place])  01/20/2018 Initial Diagnosis   Multiple myeloma not having achieved remission ([place])   01/26/2018 - 09/01/2022 Chemotherapy   Patient is on Treatment Plan : MYELOMA  RVD SQ (Bortezomib d 1,8,15 ) q28d x 4 cycles     04/13/2022 -  Chemotherapy   Patient is on Treatment Plan : MYELOMA MAINTENANCE Bortezomib SQ q14d       CANCER STAGING:  Cancer Staging  No matching staging information was found for the patient.  INTERVAL HISTORY:  [name], a 81 y.o. female, seen for follow-up of multiple myeloma and toxicity assessment prior to Velcade.  She is tolerating Velcade every other week very well.  Denies any tingling or numbness in the extremities.  Reports energy levels of 70%.  Reports no new onset pains.  REVIEW OF SYSTEMS:  Review of Systems  All other systems reviewed and are negative.   PAST MEDICAL/SURGICAL HISTORY:  Past Medical History:  Diagnosis Date   Breast cancer ([place])    left breast/ 2008/ surg/ rad tx   Coronary artery disease    Diabetes mellitus    Past Surgical History:  Procedure Laterality Date   ABDOMINAL HYSTERECTOMY     BREAST SURGERY     DEBRIDEMENT MANDIBLE N/A 02/22/2020   Procedure: INCISION AND DRAINAGE WITH DEBRIDEMENT MANDIBLE;  Surgeon: Michael Litter, DMD;  Location: [facility];  Service: Oral Surgery;  Laterality: N/A;   DEBRIDEMENT MANDIBLE Right 03/19/2021   Procedure: DEBRIDEMENT OF BONE RIGHT INTERIOR  MANDIBLE;  Surgeon: Michael Litter, DMD;  Location: [facility];  Service: Oral Surgery;   Laterality: Right;   EYE SURGERY  2021   cataract removals    TOOTH EXTRACTION N/A 02/22/2020   Procedure: DENTAL RESTORATION/EXTRACTIONS;  Surgeon: Michael Litter, DMD;  Location: [facility];  Service: Oral Surgery;  Laterality: N/A;  DENTAL KIT REQUESTED    SOCIAL HISTORY:  Social History   Socioeconomic History   Marital status: Divorced    Spouse name: Not on file   Number of children: Not on file   Years of education: Not on file   Highest education level: Not on file  Occupational History   Not on file  Tobacco Use   Smoking status: Never   Smokeless tobacco: Never  Vaping Use   Vaping Use: Never used  Substance and Sexual Activity   Alcohol use: No   Drug use: No   Sexual activity: Yes    Birth control/protection: Surgical  Other Topics Concern   Not on file  Social History Narrative   Not on file   Social Determinants of Health   Financial Resource Strain: Low Risk  (12/09/2020)   Overall Financial Resource Strain (CARDIA)    Difficulty of Paying Living Expenses: Not hard at all  Food Insecurity: No Food Insecurity (12/09/2020)   Hunger Vital Sign    Worried About Running Out of Food in the Last Year: Never true    Ran Out of Food in the Last Year: Never true  Transportation Needs: No Transportation Needs (12/09/2020)   PRAPARE -  Hydrologist (Medical): No    Lack of Transportation (Non-Medical): No  Physical Activity: Inactive (12/09/2020)   Exercise Vital Sign    Days of Exercise per Week: 0 days    Minutes of Exercise per Session: 0 min  Stress: No Stress Concern Present (12/09/2020)   [place]    Feeling of Stress : Not at all  Social Connections: Moderately Isolated (12/09/2020)   Social Connection and Isolation Panel [NHANES]    Frequency of Communication with Friends and Family: More than three times a week    Frequency of Social Gatherings with Friends and  Family: More than three times a week    Attends Religious Services: More than 4 times per year    Active Member of [employer] or Organizations: No    Attends Archivist Meetings: Never    Marital Status: Divorced  Human resources officer Violence: Not At Risk (12/09/2020)   Humiliation, Afraid, Rape, and Kick questionnaire    Fear of Current or Ex-Partner: No    Emotionally Abused: No    Physically Abused: No    Sexually Abused: No    FAMILY HISTORY:  Family History  Problem Relation Age of Onset   Obesity Sister     CURRENT MEDICATIONS:  Current Outpatient Medications  Medication Sig Dispense Refill   acyclovir (ZOVIRAX) 400 MG tablet Take 400 mg by mouth 2 (two) times daily.     aspirin 81 MG tablet Take 1 tablet (81 mg total) by mouth daily with breakfast. 30 tablet 5   bortezomib IV (VELCADE) 3.5 MG injection Inject 3.5 mg into the vein once a week. weekly     chlorhexidine (PERIDEX) 0.12 % solution Use as directed 15 mLs in the mouth or throat 3 (three) times daily.     cholecalciferol (VITAMIN D) 25 MCG (1000 UNIT) tablet 1 capsule     glucose blood (ACCU-CHEK AVIVA PLUS) test strip CHECK BLOOD SUGAR ONCE DAILY     GNP ASPIRIN LOW DOSE 81 MG tablet Take 81 mg by mouth daily.     magnesium oxide (MAG-OX) 400 (240 Mg) MG tablet TAKE (1) TABLET BY MOUTH THREE TIMES DAILY, MORNING, NOON AND BEDTIME. 90 tablet 3   mirtazapine (REMERON) 15 MG tablet Take 0.5 tablets (7.5 mg total) by mouth at bedtime. For appetite stimulation 30 tablet 2   ondansetron (ZOFRAN) 8 MG tablet      Potassium Acetate POWD Take 20 mEq by mouth daily. 12000 g 5   potassium chloride (KLOR-CON) 20 MEQ packet Take 40 mEq by mouth daily. 60 packet 3   REVLIMID 20 MG capsule Take by mouth.     No current facility-administered medications for this visit.    ALLERGIES:  Allergies  Allergen Reactions   Other Other (See Comments)   Seasonal Ic [Cholestatin] Other (See Comments)    Sneezing, watery eyes    Motrin [Ibuprofen] Rash    PHYSICAL EXAM:  Performance status (ECOG): 1 - Symptomatic but completely ambulatory  Vitals:   11/24/22 1202  BP: (!) 153/82  Pulse: 81  Resp: 20  Temp: 98.4 F (36.9 C)  SpO2: 100%   Wt Readings from Last 3 Encounters:  11/24/22 156 lb 8.4 oz (71 kg)  10/27/22 162 lb (73.5 kg)  10/12/22 162 lb 0.6 oz (73.5 kg)   Physical Exam Vitals reviewed.  Constitutional:      Appearance: Normal appearance.  Cardiovascular:  Rate and Rhythm: Normal rate and regular rhythm.     Pulses: Normal pulses.     Heart sounds: Normal heart sounds.  Pulmonary:     Effort: Pulmonary effort is normal.     Breath sounds: Normal breath sounds.  Neurological:     General: No focal deficit present.     Mental Status: She is alert and oriented to person, place, and time.  Psychiatric:        Mood and Affect: Mood normal.        Behavior: Behavior normal.     LABORATORY DATA:  I have reviewed the labs as listed.     Latest Ref Rng & Units 11/17/2022    1:23 PM 10/27/2022   10:45 AM 10/12/2022   10:29 AM  CBC  WBC 4.0 - 10.5 K/uL 7.8  9.2  4.6   Hemoglobin 12.0 - 15.0 g/dL 12.0  10.9  11.7   Hematocrit 36.0 - 46.0 % 36.1  32.8  35.6   Platelets 150 - 400 K/uL 250  170  194       Latest Ref Rng & Units 11/17/2022    1:23 PM 10/27/2022   10:45 AM 10/12/2022   10:29 AM  CMP  Glucose 70 - 99 mg/dL 180  265  186   BUN 8 - 23 mg/dL _0 Creatinine 0.44 - 1.00 mg/dL 1.13  1.16  1.07   Sodium 135 - 145 mmol/L 139  140  141   Potassium 3.5 - 5.1 mmol/L 3.6  3.1  4.2   Chloride 98 - 111 mmol/L 101  104  104   CO2 22 - 32 mmol/L _1 Calcium 8.9 - 10.3 mg/dL 9.7  9.5  10.1   Total Protein 6.5 - 8.1 g/dL 7.6  6.8  7.4   Total Bilirubin 0.3 - 1.2 mg/dL 0.7  0.6  0.5   Alkaline Phos 38 - 126 U/L 97  131  113   AST 15 - 41 U/L _2 ALT 0 - 44 U/L _3 DIAGNOSTIC IMAGING:  I have independently reviewed the scans and discussed  with the patient. No results found.   ASSESSMENT:  1.  IgA kappa plasma cell myeloma, stage I: -RVD started on 01/09/2018, held since 02/14/2020 due to mandible abscess. -Myeloma labs on 05/14/2020 showed progression with M spike of 0.5 g. -RVD started back on 05/21/2020. -Myeloma labs on 07/10/2020 shows M spike improved to 0.3 g from 0.6 g previously.  Free light chain ratio is 1.74 with kappa light chains 28.4. -Myeloma panel from 08/14/2020 shows M spike 0.4 g.  Kappa light chains are 24.8 and ratio is 2.23. - Revlimid (20 mg 2 weeks on/1 week off) and Velcade (3 weeks on/1 week off) were held after 02/26/2022 due to weight loss. - Velcade maintenance every 2 weeks started on 04/13/2022.   2.  Osteomyelitis of the right mandible/dental abscess: -Finished IV ceftriaxone on 04/03/2020.  Finished oral antibiotics. -We will hold Xgeva indefinitely.   PLAN:  1.  IgA kappa plasma cell myeloma, stage I: - She is tolerating Velcade every other week along with dexamethasone 20 mg very well. - Reviewed myeloma labs from 11/17/2022 with M spike 0.3 g.  Previously M spike was not observed.  Kappa light chains are 57, lambda light chains 23 with ratio of 2.47.  Calcium was normal. -  She had previous spikes in M protein which have subsequently normalized.  Hence I would not make any changes at this time. - Continue Velcade and dexamethasone 20 mg every 2 weeks. - RTC 8 weeks for follow-up with repeat myeloma labs.   2.  Severe hypokalemia: - Continue potassium liquid 3 times daily.  Potassium today is normal at 3.6.   3.  Osteomyelitis of the right mandible/dental abscess: -Bisphosphonates on hold indefinitely due to ONJ.   4.  Hypomagnesemia: - Continue magnesium 3 times daily.  Magnesium is 1.9.   5.  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 - Last Feraheme on 09/01/2022 and Retacrit on 09/15/2022.  Hemoglobin today is normal at 12.  Ferritin is 562 and percent saturation  23.  No intervention necessary.  Continue Retacrit as needed.   Orders placed this encounter:  No orders of the defined types were placed in this encounter.     , MD [facility] [phone]       

## 2022-11-24 NOTE — Progress Notes (Signed)
Message received from Dr. Delton Coombes / Jerilynn Mages. Edwards RN to proceed with treatment. Labs and vital signs within parameters for treatment. HGB 12.0 no Retacrit needed today. B12 to be given today. Labs drawn on 11/17/2022.

## 2022-11-24 NOTE — Progress Notes (Signed)
Treatment given per orders. Patient tolerated it well without problems. Vitals stable and discharged home from clinic ambulatory. Follow up as scheduled.  

## 2022-11-25 ENCOUNTER — Other Ambulatory Visit: Payer: Self-pay

## 2022-11-26 ENCOUNTER — Other Ambulatory Visit: Payer: Self-pay

## 2022-11-28 ENCOUNTER — Other Ambulatory Visit: Payer: Self-pay | Admitting: Hematology

## 2022-11-30 ENCOUNTER — Encounter: Payer: Self-pay | Admitting: Hematology

## 2022-11-30 ENCOUNTER — Encounter (HOSPITAL_COMMUNITY): Payer: Self-pay | Admitting: Hematology

## 2022-12-08 ENCOUNTER — Inpatient Hospital Stay: Payer: Medicare Other | Attending: Hematology

## 2022-12-08 ENCOUNTER — Inpatient Hospital Stay: Payer: Medicare Other

## 2022-12-08 VITALS — BP 180/81 | HR 70 | Temp 96.7°F | Resp 18 | Wt 155.8 lb

## 2022-12-08 DIAGNOSIS — Z5112 Encounter for antineoplastic immunotherapy: Secondary | ICD-10-CM | POA: Diagnosis present

## 2022-12-08 DIAGNOSIS — C9000 Multiple myeloma not having achieved remission: Secondary | ICD-10-CM | POA: Insufficient documentation

## 2022-12-08 LAB — COMPREHENSIVE METABOLIC PANEL
ALT: 11 U/L (ref 0–44)
AST: 16 U/L (ref 15–41)
Albumin: 3.4 g/dL — ABNORMAL LOW (ref 3.5–5.0)
Alkaline Phosphatase: 104 U/L (ref 38–126)
Anion gap: 11 (ref 5–15)
BUN: 11 mg/dL (ref 8–23)
CO2: 29 mmol/L (ref 22–32)
Calcium: 9.5 mg/dL (ref 8.9–10.3)
Chloride: 101 mmol/L (ref 98–111)
Creatinine, Ser: 0.97 mg/dL (ref 0.44–1.00)
GFR, Estimated: 58 mL/min — ABNORMAL LOW (ref >60)
Glucose, Bld: 234 mg/dL — ABNORMAL HIGH (ref 70–99)
Potassium: 3.2 mmol/L — ABNORMAL LOW (ref 3.5–5.1)
Sodium: 141 mmol/L (ref 135–145)
Total Bilirubin: 0.7 mg/dL (ref 0.3–1.2)
Total Protein: 7.1 g/dL (ref 6.5–8.1)

## 2022-12-08 LAB — CBC WITH DIFFERENTIAL/PLATELET
Abs Immature Granulocytes: 0.03 10*3/uL (ref 0.00–0.07)
Basophils Absolute: 0 10*3/uL (ref 0.0–0.1)
Basophils Relative: 0 %
Eosinophils Absolute: 0.1 10*3/uL (ref 0.0–0.5)
Eosinophils Relative: 1 %
HCT: 34.2 % — ABNORMAL LOW (ref 36.0–46.0)
Hemoglobin: 11.2 g/dL — ABNORMAL LOW (ref 12.0–15.0)
Immature Granulocytes: 1 %
Lymphocytes Relative: 15 %
Lymphs Abs: 0.8 10*3/uL (ref 0.7–4.0)
MCH: 33.2 pg (ref 26.0–34.0)
MCHC: 32.7 g/dL (ref 30.0–36.0)
MCV: 101.5 fL — ABNORMAL HIGH (ref 80.0–100.0)
Monocytes Absolute: 0.4 10*3/uL (ref 0.1–1.0)
Monocytes Relative: 7 %
Neutro Abs: 4.1 10*3/uL (ref 1.7–7.7)
Neutrophils Relative %: 76 %
Platelets: 218 10*3/uL (ref 150–400)
RBC: 3.37 MIL/uL — ABNORMAL LOW (ref 3.87–5.11)
RDW: 14.1 % (ref 11.5–15.5)
WBC: 5.4 10*3/uL (ref 4.0–10.5)
nRBC: 0 % (ref 0.0–0.2)

## 2022-12-08 LAB — MAGNESIUM: Magnesium: 1.5 mg/dL — ABNORMAL LOW (ref 1.7–2.4)

## 2022-12-08 MED ORDER — BORTEZOMIB CHEMO SQ INJECTION 3.5 MG (2.5MG/ML)
1.3000 mg/m2 | Freq: Once | INTRAMUSCULAR | Status: AC
  Administered 2022-12-08: 2.25 mg via SUBCUTANEOUS
  Filled 2022-12-08: qty 0.9

## 2022-12-08 MED ORDER — DEXAMETHASONE 4 MG PO TABS
20.0000 mg | ORAL_TABLET | Freq: Once | ORAL | Status: AC
  Administered 2022-12-08: 20 mg via ORAL
  Filled 2022-12-08: qty 5

## 2022-12-08 MED ORDER — POTASSIUM CHLORIDE CRYS ER 20 MEQ PO TBCR
40.0000 meq | EXTENDED_RELEASE_TABLET | Freq: Once | ORAL | Status: AC
  Administered 2022-12-08: 40 meq via ORAL
  Filled 2022-12-08: qty 2

## 2022-12-08 MED ORDER — MAGNESIUM SULFATE 2 GM/50ML IV SOLN
2.0000 g | Freq: Once | INTRAVENOUS | Status: AC
  Administered 2022-12-08: 2 g via INTRAVENOUS
  Filled 2022-12-08: qty 50

## 2022-12-08 MED ORDER — SODIUM CHLORIDE 0.9 % IV SOLN: Freq: Once | INTRAVENOUS | Status: AC

## 2022-12-08 MED ORDER — PROCHLORPERAZINE MALEATE 10 MG PO TABS
10.0000 mg | ORAL_TABLET | Freq: Once | ORAL | Status: AC
  Administered 2022-12-08: 10 mg via ORAL
  Filled 2022-12-08: qty 1

## 2022-12-08 NOTE — Progress Notes (Signed)
Patient presents today for Velcade injection. HGB 11.2 today. NO Retacrit given today. Patient's blood pressure elevated on arrival. Recheck 172/74. Patient denies any headaches, blurred vision, or dizziness. MAR reviewed and updated. Patient has no complaints today related to side effects from treatment.   Velcade given today per MD orders. Tolerated infusion without adverse affects. 2 grams of Magnesium Sulfate and 40 mEq PO Potassium given today. Vital signs stable. No complaints at this time. Discharged from clinic ambulatory in stable condition. Alert and oriented x 3. F/U with [facility] as scheduled.

## 2022-12-08 NOTE — Patient Instructions (Signed)
[facility]  Discharge Instructions: Thank you for choosing [facility] to provide your oncology and hematology care.  If you have a lab appointment with the [facility], please come in thru the Main Entrance and check in at the main information desk.  Wear comfortable clothing and clothing appropriate for easy access to any Portacath or PICC line.   We strive to give you quality time with your provider. You may need to reschedule your appointment if you arrive late (15 or more minutes).  Arriving late affects you and other patients whose appointments are after yours.  Also, if you miss three or more appointments without notifying the office, you may be dismissed from the clinic at the provider's discretion.      For prescription refill requests, have your pharmacy contact our office and allow 72 hours for refills to be completed.    Today you received the following chemotherapy and/or immunotherapy agents Velcade injection. Bortezomib Injection What is this medication? BORTEZOMIB (bor TEZ oh mib) treats lymphoma. It may also be used to treat multiple myeloma, a type of bone marrow cancer. It works by blocking a protein that causes cancer cells to grow and multiply. This helps to slow or stop the spread of cancer cells. This medicine may be used for other purposes; ask your health care provider or pharmacist if you have questions. COMMON BRAND NAME(S): Velcade What should I tell my care team before I take this medication? They need to know if you have any of these conditions: Dehydration Diabetes Heart disease Liver disease Tingling of the fingers or toes or other nerve disorder An unusual or allergic reaction to bortezomib, other medications, foods, dyes, or preservatives If you or your partner are pregnant or trying to get pregnant Breastfeeding How should I use this medication? This medication is injected into a vein or under the skin. It is given  by your care team in a hospital or clinic setting. Talk to your care team about the use of this medication in children. Special care may be needed. Overdosage: If you think you have taken too much of this medicine contact a poison control center or emergency room at once. NOTE: This medicine is only for you. Do not share this medicine with others. What if I miss a dose? Keep appointments for follow-up doses. It is important not to miss your dose. Call your care team if you are unable to keep an appointment. What may interact with this medication? Ketoconazole Rifampin This list may not describe all possible interactions. Give your health care provider a list of all the medicines, herbs, non-prescription drugs, or dietary supplements you use. Also tell them if you smoke, drink alcohol, or use illegal drugs. Some items may interact with your medicine. What should I watch for while using this medication? Your condition will be monitored carefully while you are receiving this medication. You may need blood work while taking this medication. This medication may affect your coordination, reaction time, or judgment. Do not drive or operate machinery until you know how this medication affects you. Sit up or stand slowly to reduce the risk of dizzy or fainting spells. Drinking alcohol with this medication can increase the risk of these side effects. This medication may increase your risk of getting an infection. Call your care team for advice if you get a fever, chills, sore throat, or other symptoms of a cold or flu. Do not treat yourself. Try to avoid being around  people who are sick. Check with your care team if you have severe diarrhea, nausea, and vomiting, or if you sweat a lot. The loss of too much body fluid may make it dangerous for you to take this medication. Talk to your care team if you may be pregnant. Serious birth defects can occur if you take this medication during pregnancy and for 7 months  after the last dose. You will need a negative pregnancy test before starting this medication. Contraception is recommended while taking this medication and for 7 months after the last dose. Your care team can help you find the option that works for you. If your partner can get pregnant, use a condom during sex while taking this medication and for 4 months after the last dose. Do not breastfeed while taking this medication and for 2 months after the last dose. This medication may cause infertility. Talk to your care team if you are concerned about your fertility. What side effects may I notice from receiving this medication? Side effects that you should report to your care team as soon as possible: Allergic reactions--skin rash, itching, hives, swelling of the face, lips, tongue, or throat Bleeding--bloody or black, tar-like stools, vomiting blood or brown material that looks like coffee grounds, red or dark brown urine, small red or purple spots on skin, unusual bruising or bleeding Bleeding in the brain--severe headache, stiff neck, confusion, dizziness, change in vision, numbness or weakness of the face, arm, or leg, trouble speaking, trouble walking, vomiting Bowel blockage--stomach cramping, unable to have a bowel movement or pass gas, loss of appetite, vomiting Heart failure--shortness of breath, swelling of the ankles, feet, or hands, sudden weight gain, unusual weakness or fatigue Infection--fever, chills, cough, sore throat, wounds that don't heal, pain or trouble when passing urine, general feeling of discomfort or being unwell Liver injury--right upper belly pain, loss of appetite, nausea, light-colored stool, dark yellow or brown urine, yellowing skin or eyes, unusual weakness or fatigue Low blood pressure--dizziness, feeling faint or lightheaded, blurry vision Lung injury--shortness of breath or trouble breathing, cough, spitting up blood, chest pain, fever Pain, tingling, or numbness in  the hands or feet Severe or prolonged diarrhea Stomach pain, bloody diarrhea, pale skin, unusual weakness or fatigue, decrease in the amount of urine, which may be signs of hemolytic uremic syndrome Sudden and severe headache, confusion, change in vision, seizures, which may be signs of posterior reversible encephalopathy syndrome (PRES) TTP--purple spots on the skin or inside the mouth, pale skin, yellowing skin or eyes, unusual weakness or fatigue, fever, fast or irregular heartbeat, confusion, change in vision, trouble speaking, trouble walking Tumor lysis syndrome (TLS)--nausea, vomiting, diarrhea, decrease in the amount of urine, dark urine, unusual weakness or fatigue, confusion, muscle pain or cramps, fast or irregular heartbeat, joint pain Side effects that usually do not require medical attention (report to your care team if they continue or are bothersome): Constipation Diarrhea Fatigue Loss of appetite Nausea This list may not describe all possible side effects. Call your doctor for medical advice about side effects. You may report side effects to FDA at 1-800-FDA-1088. Where should I keep my medication? This medication is given in a hospital or clinic. It will not be stored at home. NOTE: This sheet is a summary. It may not cover all possible information. If you have questions about this medicine, talk to your doctor, pharmacist, or health care provider.  2023 Elsevier/Gold Standard (2022-05-13 00:00:00)       To help prevent  nausea and vomiting after your treatment, we encourage you to take your nausea medication as directed.  BELOW ARE SYMPTOMS THAT SHOULD BE REPORTED IMMEDIATELY: *FEVER GREATER THAN 100.4 F (38 C) OR HIGHER *CHILLS OR SWEATING *NAUSEA AND VOMITING THAT IS NOT CONTROLLED WITH YOUR NAUSEA MEDICATION *UNUSUAL SHORTNESS OF BREATH *UNUSUAL BRUISING OR BLEEDING *URINARY PROBLEMS (pain or burning when urinating, or frequent urination) *BOWEL PROBLEMS (unusual  diarrhea, constipation, pain near the anus) TENDERNESS IN MOUTH AND THROAT WITH OR WITHOUT PRESENCE OF ULCERS (sore throat, sores in mouth, or a toothache) UNUSUAL RASH, SWELLING OR PAIN  UNUSUAL VAGINAL DISCHARGE OR ITCHING   Items with * indicate a potential emergency and should be followed up as soon as possible or go to the Emergency Department if any problems should occur.  Please show the CHEMOTHERAPY ALERT CARD or IMMUNOTHERAPY ALERT CARD at check-in to the Emergency Department and triage nurse.  Should you have questions after your visit or need to cancel or reschedule your appointment, please contact [facility] [phone]  and follow the prompts.  Office hours are 8:00 a.m. to 4:30 p.m. Monday - Friday. Please note that voicemails left after 4:00 p.m. may not be returned until the following business day.  We are closed weekends and major holidays. You have access to a nurse at all times for urgent questions. Please call the main number to the clinic [phone] and follow the prompts.  For any non-urgent questions, you may also contact your provider using MyChart. We now offer e-Visits for anyone 23 and older to request care online for non-urgent symptoms. For details visit [email or web address].   Also download the MyChart app! Go to the app store, search "MyChart", open the app, select [facility], and log in with your MyChart username and password.  Masks are optional in the cancer centers. If you would like for your care team to wear a mask while they are taking care of you, please let them know. You may have one support person who is at least 82 years old accompany you for your appointments.

## 2022-12-22 ENCOUNTER — Inpatient Hospital Stay: Payer: Medicare Other

## 2022-12-22 VITALS — BP 163/75 | HR 79 | Temp 98.0°F | Resp 20 | Wt 162.0 lb

## 2022-12-22 DIAGNOSIS — C9000 Multiple myeloma not having achieved remission: Secondary | ICD-10-CM

## 2022-12-22 DIAGNOSIS — E876 Hypokalemia: Secondary | ICD-10-CM

## 2022-12-22 DIAGNOSIS — Z5112 Encounter for antineoplastic immunotherapy: Secondary | ICD-10-CM | POA: Diagnosis not present

## 2022-12-22 DIAGNOSIS — E538 Deficiency of other specified B group vitamins: Secondary | ICD-10-CM

## 2022-12-22 LAB — CBC WITH DIFFERENTIAL/PLATELET
Abs Immature Granulocytes: 0 10*3/uL (ref 0.00–0.07)
Basophils Absolute: 0 10*3/uL (ref 0.0–0.1)
Basophils Relative: 1 %
Eosinophils Absolute: 0.2 10*3/uL (ref 0.0–0.5)
Eosinophils Relative: 3 %
HCT: 28.4 % — ABNORMAL LOW (ref 36.0–46.0)
Hemoglobin: 9.3 g/dL — ABNORMAL LOW (ref 12.0–15.0)
Immature Granulocytes: 0 %
Lymphocytes Relative: 14 %
Lymphs Abs: 0.8 10*3/uL (ref 0.7–4.0)
MCH: 33.9 pg (ref 26.0–34.0)
MCHC: 32.7 g/dL (ref 30.0–36.0)
MCV: 103.6 fL — ABNORMAL HIGH (ref 80.0–100.0)
Monocytes Absolute: 0.5 10*3/uL (ref 0.1–1.0)
Monocytes Relative: 9 %
Neutro Abs: 4.1 10*3/uL (ref 1.7–7.7)
Neutrophils Relative %: 73 %
Platelets: 166 10*3/uL (ref 150–400)
RBC: 2.74 MIL/uL — ABNORMAL LOW (ref 3.87–5.11)
RDW: 13.8 % (ref 11.5–15.5)
WBC: 5.6 10*3/uL (ref 4.0–10.5)
nRBC: 0 % (ref 0.0–0.2)

## 2022-12-22 LAB — COMPREHENSIVE METABOLIC PANEL
ALT: 7 U/L (ref 0–44)
AST: 18 U/L (ref 15–41)
Albumin: 3 g/dL — ABNORMAL LOW (ref 3.5–5.0)
Alkaline Phosphatase: 89 U/L (ref 38–126)
Anion gap: 7 (ref 5–15)
BUN: 15 mg/dL (ref 8–23)
CO2: 27 mmol/L (ref 22–32)
Calcium: 9 mg/dL (ref 8.9–10.3)
Chloride: 103 mmol/L (ref 98–111)
Creatinine, Ser: 1.06 mg/dL — ABNORMAL HIGH (ref 0.44–1.00)
GFR, Estimated: 52 mL/min — ABNORMAL LOW (ref >60)
Glucose, Bld: 366 mg/dL — ABNORMAL HIGH (ref 70–99)
Potassium: 3.3 mmol/L — ABNORMAL LOW (ref 3.5–5.1)
Sodium: 137 mmol/L (ref 135–145)
Total Bilirubin: 0.6 mg/dL (ref 0.3–1.2)
Total Protein: 6.5 g/dL (ref 6.5–8.1)

## 2022-12-22 LAB — MAGNESIUM: Magnesium: 1.5 mg/dL — ABNORMAL LOW (ref 1.7–2.4)

## 2022-12-22 MED ORDER — BORTEZOMIB CHEMO SQ INJECTION 3.5 MG (2.5MG/ML)
1.3000 mg/m2 | Freq: Once | INTRAMUSCULAR | Status: AC
  Administered 2022-12-22: 2.25 mg via SUBCUTANEOUS
  Filled 2022-12-22: qty 0.9

## 2022-12-22 MED ORDER — EPOETIN ALFA-EPBX 20000 UNIT/ML IJ SOLN
20000.0000 [IU] | Freq: Once | INTRAMUSCULAR | Status: AC
  Administered 2022-12-22: 20000 [IU] via SUBCUTANEOUS
  Filled 2022-12-22: qty 1

## 2022-12-22 MED ORDER — POTASSIUM CHLORIDE CRYS ER 20 MEQ PO TBCR
40.0000 meq | EXTENDED_RELEASE_TABLET | Freq: Once | ORAL | Status: AC
  Administered 2022-12-22: 40 meq via ORAL
  Filled 2022-12-22: qty 2

## 2022-12-22 MED ORDER — SODIUM CHLORIDE 0.9 % IV SOLN: Freq: Once | INTRAVENOUS | Status: AC

## 2022-12-22 MED ORDER — MAGNESIUM SULFATE 2 GM/50ML IV SOLN
2.0000 g | Freq: Once | INTRAVENOUS | Status: AC
  Administered 2022-12-22: 2 g via INTRAVENOUS
  Filled 2022-12-22: qty 50

## 2022-12-22 MED ORDER — CYANOCOBALAMIN 1000 MCG/ML IJ SOLN
1000.0000 ug | Freq: Once | INTRAMUSCULAR | Status: AC
  Administered 2022-12-22: 1000 ug via INTRAMUSCULAR
  Filled 2022-12-22: qty 1

## 2022-12-22 MED ORDER — DEXAMETHASONE 4 MG PO TABS
20.0000 mg | ORAL_TABLET | Freq: Once | ORAL | Status: AC
  Administered 2022-12-22: 20 mg via ORAL
  Filled 2022-12-22: qty 5

## 2022-12-22 MED ORDER — PROCHLORPERAZINE MALEATE 10 MG PO TABS
10.0000 mg | ORAL_TABLET | Freq: Once | ORAL | Status: AC
  Administered 2022-12-22: 10 mg via ORAL
  Filled 2022-12-22: qty 1

## 2022-12-22 NOTE — Progress Notes (Signed)
Patient presents today for Velcade, Retacrit, and Vitamin B12 injections.  Patient is in satisfactory condition with no complaints voiced.  Vital signs are stable.  Labs reviewed.  Magnesium today is 1.5 and potassium is 3.3. We will give Magnesium 2 grams IV x one dose and Klor Con 40 mEq PO x one dose today per standing orders by Dr. Delton Coombes.    Patient tolerated injections well with no complaints voiced.  Patient left ambulatory in stable condition.  Vital signs stable at discharge.  Follow up as scheduled.

## 2022-12-22 NOTE — Patient Instructions (Signed)
[facility]  Discharge Instructions: Thank you for choosing [facility] to provide your oncology and hematology care.  If you have a lab appointment with the Cancer Center, please come in thru the Main Entrance and check in at the main information desk.  Wear comfortable clothing and clothing appropriate for easy access to any Portacath or PICC line.   We strive to give you quality time with your provider. You may need to reschedule your appointment if you arrive late (15 or more minutes).  Arriving late affects you and other patients whose appointments are after yours.  Also, if you miss three or more appointments without notifying the office, you may be dismissed from the clinic at the provider's discretion.      For prescription refill requests, have your pharmacy contact our office and allow 72 hours for refills to be completed.    Today you received the following chemotherapy and/or immunotherapy agents Velcade.  Bortezomib Injection What is this medication? BORTEZOMIB (bor TEZ oh mib) treats lymphoma. It may also be used to treat multiple myeloma, a type of bone marrow cancer. It works by blocking a protein that causes cancer cells to grow and multiply. This helps to slow or stop the spread of cancer cells. This medicine may be used for other purposes; ask your health care provider or pharmacist if you have questions. COMMON BRAND NAME(S): Velcade What should I tell my care team before I take this medication? They need to know if you have any of these conditions: Dehydration Diabetes Heart disease Liver disease Tingling of the fingers or toes or other nerve disorder An unusual or allergic reaction to bortezomib, other medications, foods, dyes, or preservatives If you or your partner are pregnant or trying to get pregnant Breastfeeding How should I use this medication? This medication is injected into a vein or under the skin. It is given by your  care team in a hospital or clinic setting. Talk to your care team about the use of this medication in children. Special care may be needed. Overdosage: If you think you have taken too much of this medicine contact a poison control center or emergency room at once. NOTE: This medicine is only for you. Do not share this medicine with others. What if I miss a dose? Keep appointments for follow-up doses. It is important not to miss your dose. Call your care team if you are unable to keep an appointment. What may interact with this medication? Ketoconazole Rifampin This list may not describe all possible interactions. Give your health care provider a list of all the medicines, herbs, non-prescription drugs, or dietary supplements you use. Also tell them if you smoke, drink alcohol, or use illegal drugs. Some items may interact with your medicine. What should I watch for while using this medication? Your condition will be monitored carefully while you are receiving this medication. You may need blood work while taking this medication. This medication may affect your coordination, reaction time, or judgment. Do not drive or operate machinery until you know how this medication affects you. Sit up or stand slowly to reduce the risk of dizzy or fainting spells. Drinking alcohol with this medication can increase the risk of these side effects. This medication may increase your risk of getting an infection. Call your care team for advice if you get a fever, chills, sore throat, or other symptoms of a cold or flu. Do not treat yourself. Try to avoid being around   people who are sick. Check with your care team if you have severe diarrhea, nausea, and vomiting, or if you sweat a lot. The loss of too much body fluid may make it dangerous for you to take this medication. Talk to your care team if you may be pregnant. Serious birth defects can occur if you take this medication during pregnancy and for 7 months after the  last dose. You will need a negative pregnancy test before starting this medication. Contraception is recommended while taking this medication and for 7 months after the last dose. Your care team can help you find the option that works for you. If your partner can get pregnant, use a condom during sex while taking this medication and for 4 months after the last dose. Do not breastfeed while taking this medication and for 2 months after the last dose. This medication may cause infertility. Talk to your care team if you are concerned about your fertility. What side effects may I notice from receiving this medication? Side effects that you should report to your care team as soon as possible: Allergic reactions--skin rash, itching, hives, swelling of the face, lips, tongue, or throat Bleeding--bloody or black, tar-like stools, vomiting blood or Eliceo Gladu material that looks like coffee grounds, red or dark Ayala Ribble urine, small red or purple spots on skin, unusual bruising or bleeding Bleeding in the brain--severe headache, stiff neck, confusion, dizziness, change in vision, numbness or weakness of the face, arm, or leg, trouble speaking, trouble walking, vomiting Bowel blockage--stomach cramping, unable to have a bowel movement or pass gas, loss of appetite, vomiting Heart failure--shortness of breath, swelling of the ankles, feet, or hands, sudden weight gain, unusual weakness or fatigue Infection--fever, chills, cough, sore throat, wounds that don't heal, pain or trouble when passing urine, general feeling of discomfort or being unwell Liver injury--right upper belly pain, loss of appetite, nausea, light-colored stool, dark yellow or Delan Ksiazek urine, yellowing skin or eyes, unusual weakness or fatigue Low blood pressure--dizziness, feeling faint or lightheaded, blurry vision Lung injury--shortness of breath or trouble breathing, cough, spitting up blood, chest pain, fever Pain, tingling, or numbness in the hands  or feet Severe or prolonged diarrhea Stomach pain, bloody diarrhea, pale skin, unusual weakness or fatigue, decrease in the amount of urine, which may be signs of hemolytic uremic syndrome Sudden and severe headache, confusion, change in vision, seizures, which may be signs of posterior reversible encephalopathy syndrome (PRES) TTP--purple spots on the skin or inside the mouth, pale skin, yellowing skin or eyes, unusual weakness or fatigue, fever, fast or irregular heartbeat, confusion, change in vision, trouble speaking, trouble walking Tumor lysis syndrome (TLS)--nausea, vomiting, diarrhea, decrease in the amount of urine, dark urine, unusual weakness or fatigue, confusion, muscle pain or cramps, fast or irregular heartbeat, joint pain Side effects that usually do not require medical attention (report to your care team if they continue or are bothersome): Constipation Diarrhea Fatigue Loss of appetite Nausea This list may not describe all possible side effects. Call your doctor for medical advice about side effects. You may report side effects to FDA at 1-800-FDA-1088. Where should I keep my medication? This medication is given in a hospital or clinic. It will not be stored at home. NOTE: This sheet is a summary. It may not cover all possible information. If you have questions about this medicine, talk to your doctor, pharmacist, or health care provider.  2023 Elsevier/Gold Standard (2022-05-13 00:00:00)        To help  prevent nausea and vomiting after your treatment, we encourage you to take your nausea medication as directed.  BELOW ARE SYMPTOMS THAT SHOULD BE REPORTED IMMEDIATELY: *FEVER GREATER THAN 100.4 F (38 C) OR HIGHER *CHILLS OR SWEATING *NAUSEA AND VOMITING THAT IS NOT CONTROLLED WITH YOUR NAUSEA MEDICATION *UNUSUAL SHORTNESS OF BREATH *UNUSUAL BRUISING OR BLEEDING *URINARY PROBLEMS (pain or burning when urinating, or frequent urination) *BOWEL PROBLEMS (unusual diarrhea,  constipation, pain near the anus) TENDERNESS IN MOUTH AND THROAT WITH OR WITHOUT PRESENCE OF ULCERS (sore throat, sores in mouth, or a toothache) UNUSUAL RASH, SWELLING OR PAIN  UNUSUAL VAGINAL DISCHARGE OR ITCHING   Items with * indicate a potential emergency and should be followed up as soon as possible or go to the Emergency Department if any problems should occur.  Please show the CHEMOTHERAPY ALERT CARD or IMMUNOTHERAPY ALERT CARD at check-in to the Emergency Department and triage nurse.  Should you have questions after your visit or need to cancel or reschedule your appointment, please contact [facility] [phone]  and follow the prompts.  Office hours are 8:00 a.m. to 4:30 p.m. Monday - Friday. Please note that voicemails left after 4:00 p.m. may not be returned until the following business day.  We are closed weekends and major holidays. You have access to a nurse at all times for urgent questions. Please call the main number to the clinic [phone] and follow the prompts.  For any non-urgent questions, you may also contact your provider using MyChart. We now offer e-Visits for anyone 26 and older to request care online for non-urgent symptoms. For details visit [email or web address].   Also download the MyChart app! Go to the app store, search "MyChart", open the app, select [facility], and log in with your MyChart username and password.  Masks are optional in the cancer centers. If you would like for your care team to wear a mask while they are taking care of you, please let them know. You may have one support person who is at least 82 years old accompany you for your appointments.

## 2023-01-05 ENCOUNTER — Inpatient Hospital Stay: Payer: Medicare Other

## 2023-01-06 ENCOUNTER — Other Ambulatory Visit: Payer: Self-pay

## 2023-01-11 ENCOUNTER — Other Ambulatory Visit: Payer: Self-pay

## 2023-01-11 DIAGNOSIS — C9000 Multiple myeloma not having achieved remission: Secondary | ICD-10-CM

## 2023-01-12 ENCOUNTER — Inpatient Hospital Stay: Payer: Medicare Other

## 2023-01-19 ENCOUNTER — Inpatient Hospital Stay: Payer: Medicare Other | Admitting: Hematology

## 2023-01-19 ENCOUNTER — Inpatient Hospital Stay: Payer: Medicare Other

## 2023-01-19 ENCOUNTER — Other Ambulatory Visit: Payer: Self-pay

## 2023-01-26 ENCOUNTER — Inpatient Hospital Stay: Payer: Medicare Other

## 2023-01-26 ENCOUNTER — Inpatient Hospital Stay: Payer: Medicare Other | Attending: Hematology

## 2023-01-26 ENCOUNTER — Other Ambulatory Visit: Payer: Self-pay

## 2023-01-26 ENCOUNTER — Inpatient Hospital Stay: Payer: Medicare Other | Admitting: Hematology

## 2023-01-26 VITALS — BP 158/73 | HR 71 | Temp 97.6°F | Resp 16 | Wt 160.0 lb

## 2023-01-26 DIAGNOSIS — E538 Deficiency of other specified B group vitamins: Secondary | ICD-10-CM | POA: Diagnosis not present

## 2023-01-26 DIAGNOSIS — Z5112 Encounter for antineoplastic immunotherapy: Secondary | ICD-10-CM | POA: Diagnosis present

## 2023-01-26 DIAGNOSIS — C9000 Multiple myeloma not having achieved remission: Secondary | ICD-10-CM

## 2023-01-26 LAB — CBC WITH DIFFERENTIAL/PLATELET
Abs Immature Granulocytes: 0.02 10*3/uL (ref 0.00–0.07)
Basophils Absolute: 0 10*3/uL (ref 0.0–0.1)
Basophils Relative: 0 %
Eosinophils Absolute: 0.1 10*3/uL (ref 0.0–0.5)
Eosinophils Relative: 1 %
HCT: 32.2 % — ABNORMAL LOW (ref 36.0–46.0)
Hemoglobin: 10.3 g/dL — ABNORMAL LOW (ref 12.0–15.0)
Immature Granulocytes: 1 %
Lymphocytes Relative: 20 %
Lymphs Abs: 0.8 10*3/uL (ref 0.7–4.0)
MCH: 33.1 pg (ref 26.0–34.0)
MCHC: 32 g/dL (ref 30.0–36.0)
MCV: 103.5 fL — ABNORMAL HIGH (ref 80.0–100.0)
Monocytes Absolute: 0.3 10*3/uL (ref 0.1–1.0)
Monocytes Relative: 6 %
Neutro Abs: 3 10*3/uL (ref 1.7–7.7)
Neutrophils Relative %: 72 %
Platelets: 235 10*3/uL (ref 150–400)
RBC: 3.11 MIL/uL — ABNORMAL LOW (ref 3.87–5.11)
RDW: 13.7 % (ref 11.5–15.5)
WBC: 4.1 10*3/uL (ref 4.0–10.5)
nRBC: 0 % (ref 0.0–0.2)

## 2023-01-26 LAB — COMPREHENSIVE METABOLIC PANEL
ALT: 8 U/L (ref 0–44)
AST: 15 U/L (ref 15–41)
Albumin: 3.4 g/dL — ABNORMAL LOW (ref 3.5–5.0)
Alkaline Phosphatase: 103 U/L (ref 38–126)
Anion gap: 10 (ref 5–15)
BUN: 18 mg/dL (ref 8–23)
CO2: 24 mmol/L (ref 22–32)
Calcium: 9.4 mg/dL (ref 8.9–10.3)
Chloride: 102 mmol/L (ref 98–111)
Creatinine, Ser: 1.1 mg/dL — ABNORMAL HIGH (ref 0.44–1.00)
GFR, Estimated: 50 mL/min — ABNORMAL LOW (ref >60)
Glucose, Bld: 306 mg/dL — ABNORMAL HIGH (ref 70–99)
Potassium: 3.6 mmol/L (ref 3.5–5.1)
Sodium: 136 mmol/L (ref 135–145)
Total Bilirubin: 0.3 mg/dL (ref 0.3–1.2)
Total Protein: 7.2 g/dL (ref 6.5–8.1)

## 2023-01-26 LAB — IRON AND TIBC
Iron: 53 ug/dL (ref 28–170)
Saturation Ratios: 50 % — ABNORMAL HIGH (ref 10.4–31.8)
TIBC: 107 ug/dL — ABNORMAL LOW (ref 250–450)
UIBC: 54 ug/dL

## 2023-01-26 LAB — FERRITIN: Ferritin: 339 ng/mL — ABNORMAL HIGH (ref 11–307)

## 2023-01-26 LAB — LACTATE DEHYDROGENASE: LDH: 125 U/L (ref 98–192)

## 2023-01-26 LAB — MAGNESIUM: Magnesium: 1.8 mg/dL (ref 1.7–2.4)

## 2023-01-26 MED ORDER — DEXAMETHASONE 4 MG PO TABS
20.0000 mg | ORAL_TABLET | Freq: Once | ORAL | Status: AC
  Administered 2023-01-26: 20 mg via ORAL
  Filled 2023-01-26: qty 5

## 2023-01-26 MED ORDER — BORTEZOMIB CHEMO SQ INJECTION 3.5 MG (2.5MG/ML)
1.3000 mg/m2 | Freq: Once | INTRAMUSCULAR | Status: AC
  Administered 2023-01-26: 2.25 mg via SUBCUTANEOUS
  Filled 2023-01-26: qty 0.9

## 2023-01-26 MED ORDER — CYANOCOBALAMIN 1000 MCG/ML IJ SOLN
1000.0000 ug | Freq: Once | INTRAMUSCULAR | Status: AC
  Administered 2023-01-26: 1000 ug via INTRAMUSCULAR
  Filled 2023-01-26: qty 1

## 2023-01-26 MED ORDER — PROCHLORPERAZINE MALEATE 10 MG PO TABS
10.0000 mg | ORAL_TABLET | Freq: Once | ORAL | Status: AC
  Administered 2023-01-26: 10 mg via ORAL
  Filled 2023-01-26: qty 1

## 2023-01-26 NOTE — Patient Instructions (Signed)
[facility]  Discharge Instructions: Thank you for choosing [facility] to provide your oncology and hematology care.  If you have a lab appointment with the [facility], please come in thru the Main Entrance and check in at the main information desk.  Wear comfortable clothing and clothing appropriate for easy access to any Portacath or PICC line.   We strive to give you quality time with your provider. You may need to reschedule your appointment if you arrive late (15 or more minutes).  Arriving late affects you and other patients whose appointments are after yours.  Also, if you miss three or more appointments without notifying the office, you may be dismissed from the clinic at the provider's discretion.      For prescription refill requests, have your pharmacy contact our office and allow 72 hours for refills to be completed.    Today you received the following chemotherapy and/or immunotherapy agents Velcade and B12 injection.   To help prevent nausea and vomiting after your treatment, we encourage you to take your nausea medication as directed.  Bortezomib Injection What is this medication? BORTEZOMIB (bor TEZ oh mib) treats lymphoma. It may also be used to treat multiple myeloma, a type of bone marrow cancer. It works by blocking a protein that causes cancer cells to grow and multiply. This helps to slow or stop the spread of cancer cells. This medicine may be used for other purposes; ask your health care provider or pharmacist if you have questions. COMMON BRAND NAME(S): Velcade What should I tell my care team before I take this medication? They need to know if you have any of these conditions: Dehydration Diabetes Heart disease Liver disease Tingling of the fingers or toes or other nerve disorder An unusual or allergic reaction to bortezomib, other medications, foods, dyes, or preservatives If you or your partner are pregnant or trying to get  pregnant Breastfeeding How should I use this medication? This medication is injected into a vein or under the skin. It is given by your care team in a hospital or clinic setting. Talk to your care team about the use of this medication in children. Special care may be needed. Overdosage: If you think you have taken too much of this medicine contact a poison control center or emergency room at once. NOTE: This medicine is only for you. Do not share this medicine with others. What if I miss a dose? Keep appointments for follow-up doses. It is important not to miss your dose. Call your care team if you are unable to keep an appointment. What may interact with this medication? Ketoconazole Rifampin This list may not describe all possible interactions. Give your health care provider a list of all the medicines, herbs, non-prescription drugs, or dietary supplements you use. Also tell them if you smoke, drink alcohol, or use illegal drugs. Some items may interact with your medicine. What should I watch for while using this medication? Your condition will be monitored carefully while you are receiving this medication. You may need blood work while taking this medication. This medication may affect your coordination, reaction time, or judgment. Do not drive or operate machinery until you know how this medication affects you. Sit up or stand slowly to reduce the risk of dizzy or fainting spells. Drinking alcohol with this medication can increase the risk of these side effects. This medication may increase your risk of getting an infection. Call your care team for advice if  you get a fever, chills, sore throat, or other symptoms of a cold or flu. Do not treat yourself. Try to avoid being around people who are sick. Check with your care team if you have severe diarrhea, nausea, and vomiting, or if you sweat a lot. The loss of too much body fluid may make it dangerous for you to take this medication. Talk to  your care team if you may be pregnant. Serious birth defects can occur if you take this medication during pregnancy and for 7 months after the last dose. You will need a negative pregnancy test before starting this medication. Contraception is recommended while taking this medication and for 7 months after the last dose. Your care team can help you find the option that works for you. If your partner can get pregnant, use a condom during sex while taking this medication and for 4 months after the last dose. Do not breastfeed while taking this medication and for 2 months after the last dose. This medication may cause infertility. Talk to your care team if you are concerned about your fertility. What side effects may I notice from receiving this medication? Side effects that you should report to your care team as soon as possible: Allergic reactions--skin rash, itching, hives, swelling of the face, lips, tongue, or throat Bleeding--bloody or black, tar-like stools, vomiting blood or brown material that looks like coffee grounds, red or dark brown urine, small red or purple spots on skin, unusual bruising or bleeding Bleeding in the brain--severe headache, stiff neck, confusion, dizziness, change in vision, numbness or weakness of the face, arm, or leg, trouble speaking, trouble walking, vomiting Bowel blockage--stomach cramping, unable to have a bowel movement or pass gas, loss of appetite, vomiting Heart failure--shortness of breath, swelling of the ankles, feet, or hands, sudden weight gain, unusual weakness or fatigue Infection--fever, chills, cough, sore throat, wounds that don't heal, pain or trouble when passing urine, general feeling of discomfort or being unwell Liver injury--right upper belly pain, loss of appetite, nausea, light-colored stool, dark yellow or brown urine, yellowing skin or eyes, unusual weakness or fatigue Low blood pressure--dizziness, feeling faint or lightheaded, blurry  vision Lung injury--shortness of breath or trouble breathing, cough, spitting up blood, chest pain, fever Pain, tingling, or numbness in the hands or feet Severe or prolonged diarrhea Stomach pain, bloody diarrhea, pale skin, unusual weakness or fatigue, decrease in the amount of urine, which may be signs of hemolytic uremic syndrome Sudden and severe headache, confusion, change in vision, seizures, which may be signs of posterior reversible encephalopathy syndrome (PRES) TTP--purple spots on the skin or inside the mouth, pale skin, yellowing skin or eyes, unusual weakness or fatigue, fever, fast or irregular heartbeat, confusion, change in vision, trouble speaking, trouble walking Tumor lysis syndrome (TLS)--nausea, vomiting, diarrhea, decrease in the amount of urine, dark urine, unusual weakness or fatigue, confusion, muscle pain or cramps, fast or irregular heartbeat, joint pain Side effects that usually do not require medical attention (report to your care team if they continue or are bothersome): Constipation Diarrhea Fatigue Loss of appetite Nausea This list may not describe all possible side effects. Call your doctor for medical advice about side effects. You may report side effects to FDA at 1-800-FDA-1088. Where should I keep my medication? This medication is given in a hospital or clinic. It will not be stored at home. NOTE: This sheet is a summary. It may not cover all possible information. If you have questions about this medicine,  talk to your doctor, pharmacist, or health care provider.  2023 Elsevier/Gold Standard (2022-05-13 00:00:00)   BELOW ARE SYMPTOMS THAT SHOULD BE REPORTED IMMEDIATELY: *FEVER GREATER THAN 100.4 F (38 C) OR HIGHER *CHILLS OR SWEATING *NAUSEA AND VOMITING THAT IS NOT CONTROLLED WITH YOUR NAUSEA MEDICATION *UNUSUAL SHORTNESS OF BREATH *UNUSUAL BRUISING OR BLEEDING *URINARY PROBLEMS (pain or burning when urinating, or frequent urination) *BOWEL  PROBLEMS (unusual diarrhea, constipation, pain near the anus) TENDERNESS IN MOUTH AND THROAT WITH OR WITHOUT PRESENCE OF ULCERS (sore throat, sores in mouth, or a toothache) UNUSUAL RASH, SWELLING OR PAIN  UNUSUAL VAGINAL DISCHARGE OR ITCHING   Items with * indicate a potential emergency and should be followed up as soon as possible or go to the Emergency Department if any problems should occur.  Please show the CHEMOTHERAPY ALERT CARD or IMMUNOTHERAPY ALERT CARD at check-in to the Emergency Department and triage nurse.  Should you have questions after your visit or need to cancel or reschedule your appointment, please contact [facility] [phone]  and follow the prompts.  Office hours are 8:00 a.m. to 4:30 p.m. Monday - Friday. Please note that voicemails left after 4:00 p.m. may not be returned until the following business day.  We are closed weekends and major holidays. You have access to a nurse at all times for urgent questions. Please call the main number to the clinic [phone] and follow the prompts.  For any non-urgent questions, you may also contact your provider using MyChart. We now offer e-Visits for anyone 19 and older to request care online for non-urgent symptoms. For details visit [email or web address].   Also download the MyChart app! Go to the app store, search "MyChart", open the app, select [facility], and log in with your MyChart username and password.

## 2023-01-26 NOTE — Progress Notes (Signed)
Pt presents today for Velcade, Retacrit and B12 injections per provider's order. Vital signs and labs WNL for treatment. Okay to proceed with treatment today.  Pt's hemoglobin noted to be 10.3 today, no Retacrit needed today per parameters.   Velcade and B12 injections given today per MD orders. Tolerated infusion without adverse affects. Vital signs stable. No complaints at this time. Discharged from clinic ambulatory in stable condition. Alert and oriented x 3. F/U with [facility] as scheduled.

## 2023-01-27 ENCOUNTER — Other Ambulatory Visit: Payer: Self-pay

## 2023-01-27 LAB — KAPPA/LAMBDA LIGHT CHAINS
Kappa free light chain: 120.6 mg/L — ABNORMAL HIGH (ref 3.3–19.4)
Kappa, lambda light chain ratio: 6 — ABNORMAL HIGH (ref 0.26–1.65)
Lambda free light chains: 20.1 mg/L (ref 5.7–26.3)

## 2023-01-29 LAB — PROTEIN ELECTROPHORESIS, SERUM
A/G Ratio: 1.1 (ref 0.7–1.7)
Albumin ELP: 3.4 g/dL (ref 2.9–4.4)
Alpha-1-Globulin: 0.2 g/dL (ref 0.0–0.4)
Alpha-2-Globulin: 0.8 g/dL (ref 0.4–1.0)
Beta Globulin: 1.2 g/dL (ref 0.7–1.3)
Gamma Globulin: 1 g/dL (ref 0.4–1.8)
Globulin, Total: 3.2 g/dL (ref 2.2–3.9)
M-Spike, %: 0.3 g/dL — ABNORMAL HIGH
Total Protein ELP: 6.6 g/dL (ref 6.0–8.5)

## 2023-02-02 LAB — IMMUNOFIXATION ELECTROPHORESIS
IgA: 639 mg/dL — ABNORMAL HIGH (ref 64–422)
IgG (Immunoglobin G), Serum: 1019 mg/dL (ref 586–1602)
IgM (Immunoglobulin M), Srm: 121 mg/dL (ref 26–217)
Total Protein ELP: 6.6 g/dL (ref 6.0–8.5)

## 2023-02-10 ENCOUNTER — Encounter: Payer: Self-pay | Admitting: Hematology

## 2023-02-10 ENCOUNTER — Inpatient Hospital Stay: Payer: Medicare Other | Attending: Hematology

## 2023-02-10 ENCOUNTER — Other Ambulatory Visit: Payer: Self-pay

## 2023-02-10 ENCOUNTER — Inpatient Hospital Stay (HOSPITAL_BASED_OUTPATIENT_CLINIC_OR_DEPARTMENT_OTHER): Payer: Medicare Other | Admitting: Hematology

## 2023-02-10 ENCOUNTER — Inpatient Hospital Stay: Payer: Medicare Other

## 2023-02-10 DIAGNOSIS — C9000 Multiple myeloma not having achieved remission: Secondary | ICD-10-CM | POA: Insufficient documentation

## 2023-02-10 DIAGNOSIS — Z5112 Encounter for antineoplastic immunotherapy: Secondary | ICD-10-CM | POA: Diagnosis present

## 2023-02-10 DIAGNOSIS — Z79899 Other long term (current) drug therapy: Secondary | ICD-10-CM | POA: Insufficient documentation

## 2023-02-10 DIAGNOSIS — E538 Deficiency of other specified B group vitamins: Secondary | ICD-10-CM | POA: Diagnosis not present

## 2023-02-10 LAB — COMPREHENSIVE METABOLIC PANEL
ALT: 10 U/L (ref 0–44)
AST: 16 U/L (ref 15–41)
Albumin: 3.6 g/dL (ref 3.5–5.0)
Alkaline Phosphatase: 118 U/L (ref 38–126)
Anion gap: 11 (ref 5–15)
BUN: 13 mg/dL (ref 8–23)
CO2: 27 mmol/L (ref 22–32)
Calcium: 9.2 mg/dL (ref 8.9–10.3)
Chloride: 100 mmol/L (ref 98–111)
Creatinine, Ser: 0.87 mg/dL (ref 0.44–1.00)
GFR, Estimated: >60 mL/min (ref >60)
Glucose, Bld: 245 mg/dL — ABNORMAL HIGH (ref 70–99)
Potassium: 3.4 mmol/L — ABNORMAL LOW (ref 3.5–5.1)
Sodium: 138 mmol/L (ref 135–145)
Total Bilirubin: 0.9 mg/dL (ref 0.3–1.2)
Total Protein: 7.2 g/dL (ref 6.5–8.1)

## 2023-02-10 LAB — CBC WITH DIFFERENTIAL/PLATELET
Abs Immature Granulocytes: 0.01 10*3/uL (ref 0.00–0.07)
Basophils Absolute: 0 10*3/uL (ref 0.0–0.1)
Basophils Relative: 1 %
Eosinophils Absolute: 0.1 10*3/uL (ref 0.0–0.5)
Eosinophils Relative: 1 %
HCT: 33.8 % — ABNORMAL LOW (ref 36.0–46.0)
Hemoglobin: 11.3 g/dL — ABNORMAL LOW (ref 12.0–15.0)
Immature Granulocytes: 0 %
Lymphocytes Relative: 15 %
Lymphs Abs: 0.8 10*3/uL (ref 0.7–4.0)
MCH: 33.9 pg (ref 26.0–34.0)
MCHC: 33.4 g/dL (ref 30.0–36.0)
MCV: 101.5 fL — ABNORMAL HIGH (ref 80.0–100.0)
Monocytes Absolute: 0.3 10*3/uL (ref 0.1–1.0)
Monocytes Relative: 7 %
Neutro Abs: 3.8 10*3/uL (ref 1.7–7.7)
Neutrophils Relative %: 76 %
Platelets: 196 10*3/uL (ref 150–400)
RBC: 3.33 MIL/uL — ABNORMAL LOW (ref 3.87–5.11)
RDW: 13.7 % (ref 11.5–15.5)
WBC: 5 10*3/uL (ref 4.0–10.5)
nRBC: 0 % (ref 0.0–0.2)

## 2023-02-10 LAB — MAGNESIUM: Magnesium: 1.7 mg/dL (ref 1.7–2.4)

## 2023-02-10 MED ORDER — PROCHLORPERAZINE MALEATE 10 MG PO TABS
10.0000 mg | ORAL_TABLET | Freq: Once | ORAL | Status: AC
  Administered 2023-02-10: 10 mg via ORAL
  Filled 2023-02-10: qty 1

## 2023-02-10 MED ORDER — BORTEZOMIB CHEMO SQ INJECTION 3.5 MG (2.5MG/ML)
1.3000 mg/m2 | Freq: Once | INTRAMUSCULAR | Status: AC
  Administered 2023-02-10: 2.25 mg via SUBCUTANEOUS
  Filled 2023-02-10: qty 0.9

## 2023-02-10 MED ORDER — DEXAMETHASONE 4 MG PO TABS
20.0000 mg | ORAL_TABLET | Freq: Once | ORAL | Status: AC
  Administered 2023-02-10: 20 mg via ORAL
  Filled 2023-02-10: qty 5

## 2023-02-10 NOTE — Progress Notes (Signed)
Patient has been examined by Dr. Katragadda, and vital signs and labs have been reviewed. ANC, Creatinine, LFTs, hemoglobin, and platelets are within treatment parameters per M.D. - pt may proceed with treatment.  Primary RN and pharmacy notified.  

## 2023-02-10 NOTE — Progress Notes (Signed)
Patient presents today for Velcade and Retacrit injections.  Patient is in satisfactory condition with no complaints voiced.  Vital signs are stable.  Labs reviewed  All labs are within treatment parameters.  Hemoglobin today is 11.3.  We will hold Retacrit injection per MD orders.  We will proceed with Velcade as ordered.   Patient tolerated Velcade injection with no complaints voiced.  Site clean and dry with no bruising or swelling noted.  No complaints of pain.  Discharged with vital signs stable and no signs or symptoms of distress noted.

## 2023-02-10 NOTE — Patient Instructions (Signed)
[facility]  Discharge Instructions: Thank you for choosing [name] to provide your oncology and hematology care.  If you have a lab appointment with the [facility], please come in thru the Main Entrance and check in at the main information desk.  Wear comfortable clothing and clothing appropriate for easy access to any Portacath or PICC line.   We strive to give you quality time with your provider. You may need to reschedule your appointment if you arrive late (15 or more minutes).  Arriving late affects you and other patients whose appointments are after yours.  Also, if you miss three or more appointments without notifying the office, you may be dismissed from the clinic at the provider's discretion.      For prescription refill requests, have your pharmacy contact our office and allow 72 hours for refills to be completed.    Today you received the following chemotherapy and/or immunotherapy agents Velcade.  Bortezomib Injection What is this medication? BORTEZOMIB (bor TEZ oh mib) treats lymphoma. It may also be used to treat multiple myeloma, a type of bone marrow cancer. It works by blocking a protein that causes cancer cells to grow and multiply. This helps to slow or stop the spread of cancer cells. This medicine may be used for other purposes; ask your health care provider or pharmacist if you have questions. COMMON BRAND NAME(S): Velcade What should I tell my care team before I take this medication? They need to know if you have any of these conditions: Dehydration Diabetes Heart disease Liver disease Tingling of the fingers or toes or other nerve disorder An unusual or allergic reaction to bortezomib, other medications, foods, dyes, or preservatives If you or your partner are pregnant or trying to get pregnant Breastfeeding How should I use this medication? This medication is injected into a vein or under the skin. It is given by your  care team in a hospital or clinic setting. Talk to your care team about the use of this medication in children. Special care may be needed. Overdosage: If you think you have taken too much of this medicine contact a poison control center or emergency room at once. NOTE: This medicine is only for you. Do not share this medicine with others. What if I miss a dose? Keep appointments for follow-up doses. It is important not to miss your dose. Call your care team if you are unable to keep an appointment. What may interact with this medication? Ketoconazole Rifampin This list may not describe all possible interactions. Give your health care provider a list of all the medicines, herbs, non-prescription drugs, or dietary supplements you use. Also tell them if you smoke, drink alcohol, or use illegal drugs. Some items may interact with your medicine. What should I watch for while using this medication? Your condition will be monitored carefully while you are receiving this medication. You may need blood work while taking this medication. This medication may affect your coordination, reaction time, or judgment. Do not drive or operate machinery until you know how this medication affects you. Sit up or stand slowly to reduce the risk of dizzy or fainting spells. Drinking alcohol with this medication can increase the risk of these side effects. This medication may increase your risk of getting an infection. Call your care team for advice if you get a fever, chills, sore throat, or other symptoms of a cold or flu. Do not treat yourself. Try to avoid being around  people who are sick. Check with your care team if you have severe diarrhea, nausea, and vomiting, or if you sweat a lot. The loss of too much body fluid may make it dangerous for you to take this medication. Talk to your care team if you may be pregnant. Serious birth defects can occur if you take this medication during pregnancy and for 7 months after the  last dose. You will need a negative pregnancy test before starting this medication. Contraception is recommended while taking this medication and for 7 months after the last dose. Your care team can help you find the option that works for you. If your partner can get pregnant, use a condom during sex while taking this medication and for 4 months after the last dose. Do not breastfeed while taking this medication and for 2 months after the last dose. This medication may cause infertility. Talk to your care team if you are concerned about your fertility. What side effects may I notice from receiving this medication? Side effects that you should report to your care team as soon as possible: Allergic reactions--skin rash, itching, hives, swelling of the face, lips, tongue, or throat Bleeding--bloody or black, tar-like stools, vomiting blood or Brittany Archer material that looks like coffee grounds, red or dark Brittany Archer urine, small red or purple spots on skin, unusual bruising or bleeding Bleeding in the brain--severe headache, stiff neck, confusion, dizziness, change in vision, numbness or weakness of the face, arm, or leg, trouble speaking, trouble walking, vomiting Bowel blockage--stomach cramping, unable to have a bowel movement or pass gas, loss of appetite, vomiting Heart failure--shortness of breath, swelling of the ankles, feet, or hands, sudden weight gain, unusual weakness or fatigue Infection--fever, chills, cough, sore throat, wounds that don't heal, pain or trouble when passing urine, general feeling of discomfort or being unwell Liver injury--right upper belly pain, loss of appetite, nausea, light-colored stool, dark yellow or Brittany Archer urine, yellowing skin or eyes, unusual weakness or fatigue Low blood pressure--dizziness, feeling faint or lightheaded, blurry vision Lung injury--shortness of breath or trouble breathing, cough, spitting up blood, chest pain, fever Pain, tingling, or numbness in the hands  or feet Severe or prolonged diarrhea Stomach pain, bloody diarrhea, pale skin, unusual weakness or fatigue, decrease in the amount of urine, which may be signs of hemolytic uremic syndrome Sudden and severe headache, confusion, change in vision, seizures, which may be signs of posterior reversible encephalopathy syndrome (PRES) TTP--purple spots on the skin or inside the mouth, pale skin, yellowing skin or eyes, unusual weakness or fatigue, fever, fast or irregular heartbeat, confusion, change in vision, trouble speaking, trouble walking Tumor lysis syndrome (TLS)--nausea, vomiting, diarrhea, decrease in the amount of urine, dark urine, unusual weakness or fatigue, confusion, muscle pain or cramps, fast or irregular heartbeat, joint pain Side effects that usually do not require medical attention (report to your care team if they continue or are bothersome): Constipation Diarrhea Fatigue Loss of appetite Nausea This list may not describe all possible side effects. Call your doctor for medical advice about side effects. You may report side effects to FDA at 1-800-FDA-1088. Where should I keep my medication? This medication is given in a hospital or clinic. It will not be stored at home. NOTE: This sheet is a summary. It may not cover all possible information. If you have questions about this medicine, talk to your doctor, pharmacist, or health care provider.  2023 Elsevier/Gold Standard (2022-05-13 00:00:00)        To help  prevent nausea and vomiting after your treatment, we encourage you to take your nausea medication as directed.  BELOW ARE SYMPTOMS THAT SHOULD BE REPORTED IMMEDIATELY: *FEVER GREATER THAN 100.4 F (38 C) OR HIGHER *CHILLS OR SWEATING *NAUSEA AND VOMITING THAT IS NOT CONTROLLED WITH YOUR NAUSEA MEDICATION *UNUSUAL SHORTNESS OF BREATH *UNUSUAL BRUISING OR BLEEDING *URINARY PROBLEMS (pain or burning when urinating, or frequent urination) *BOWEL PROBLEMS (unusual diarrhea,  constipation, pain near the anus) TENDERNESS IN MOUTH AND THROAT WITH OR WITHOUT PRESENCE OF ULCERS (sore throat, sores in mouth, or a toothache) UNUSUAL RASH, SWELLING OR PAIN  UNUSUAL VAGINAL DISCHARGE OR ITCHING   Items with * indicate a potential emergency and should be followed up as soon as possible or go to the Emergency Department if any problems should occur.  Please show the CHEMOTHERAPY ALERT CARD or IMMUNOTHERAPY ALERT CARD at check-in to the Emergency Department and triage nurse.  Should you have questions after your visit or need to cancel or reschedule your appointment, please contact [facility] [phone]  and follow the prompts.  Office hours are 8:00 a.m. to 4:30 p.m. Monday - Friday. Please note that voicemails left after 4:00 p.m. may not be returned until the following business day.  We are closed weekends and major holidays. You have access to a nurse at all times for urgent questions. Please call the main number to the clinic [phone] and follow the prompts.  For any non-urgent questions, you may also contact your provider using MyChart. We now offer e-Visits for anyone 51 and older to request care online for non-urgent symptoms. For details visit [email or web address].   Also download the MyChart app! Go to the app store, search "MyChart", open the app, select [facility], and log in with your MyChart username and password.

## 2023-02-10 NOTE — Progress Notes (Signed)
[facility] [address], [place] [address]    Clinic Day:  02/10/2023  Referring physician: [name], MD  Patient Care Team: [name], MD as PCP - General (Internal Medicine) Derek Jack, MD as Medical Oncologist (Hematology)   ASSESSMENT & PLAN:   Assessment: 1.  IgA kappa plasma cell myeloma, stage I: -RVD started on 01/09/2018, held since 02/14/2020 due to mandible abscess. -Myeloma labs on 05/14/2020 showed progression with M spike of 0.5 g. -RVD started back on 05/21/2020. -Myeloma labs on 07/10/2020 shows M spike improved to 0.3 g from 0.6 g previously.  Free light chain ratio is 1.74 with kappa light chains 28.4. -Myeloma panel from 08/14/2020 shows M spike 0.4 g.  Kappa light chains are 24.8 and ratio is 2.23. - Revlimid (20 mg 2 weeks on/1 week off) and Velcade (3 weeks on/1 week off) were held after 02/26/2022 due to weight loss. - Velcade maintenance every 2 weeks started on 04/13/2022.   2.  Osteomyelitis of the right mandible/dental abscess: -Finished IV ceftriaxone on 04/03/2020.  Finished oral antibiotics. -We will hold Xgeva indefinitely.  Plan: 1.  IgA kappa plasma cell myeloma, stage I: - She is tolerating Velcade every other week along with dexamethasone 20 mg very well. - Reviewed myeloma labs from 01/26/2023.  M spike is stable at 0.3 g.  Kappa light chains have increased to 120 and ratio increased to 6.  Immunofixation shows IgG kappa. - When myeloma labs were drawn, the creatinine went up to 1.1.  Today 0.87. - I have recommended continuing treatment at this time.  I will repeat serum free light chains and follow-up in 4 weeks.   2.  Severe hypokalemia: - Continue potassium liquid 3 times daily.  Potassium is 3.4.   3.  Osteomyelitis of the right mandible/dental abscess: - Bisphosphonates on hold due to ONJ.   4.  Hypomagnesemia: - Continue magnesium 3 times daily.  Magnesium is 1.7.   5.  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 - Hemoglobin is 11.3 with MCV of 101.5.  Last Retacrit was on 12/22/2022.  Will continue to monitor closely.  No orders of the defined types were placed in this encounter.     Beverly Gust Oliver,acting as a scribe for Derek Jack, MD.,have documented all relevant documentation on the behalf of Derek Jack, MD,as directed by  Derek Jack, MD while in the presence of Derek Jack, MD.   I, Derek Jack MD, have reviewed the above documentation for accuracy and completeness, and I agree with the above.   Doyce Loose   2/14/20249:28 AM  CHIEF COMPLAINT:   Diagnosis: multiple myeloma     Cancer Staging  No matching staging information was found for the patient.   Prior Therapy: none  Current Therapy:   Maintenance Velcade every other week.     HISTORY OF PRESENT ILLNESS:   Oncology History  Multiple myeloma not having achieved remission ([facility])  01/20/2018 Initial Diagnosis   Multiple myeloma not having achieved remission ([facility])   01/26/2018 - 09/01/2022 Chemotherapy   Patient is on Treatment Plan : MYELOMA  RVD SQ (Bortezomib d 1,8,15 ) q28d x 4 cycles     04/13/2022 -  Chemotherapy   Patient is on Treatment Plan : MYELOMA MAINTENANCE Bortezomib SQ q14d        INTERVAL HISTORY:   Brittany Archer is a 82 y.o. female presenting to clinic today for follow up of multiple myeloma. She was  last seen by me on 11/24/2022.  Today, she states that she is doing well overall. Her appetite level is at 100%. Her energy level is at 75%. She denies having diarrhea. She denies neuropathy and recent infections. Her weight is currently stable.  PAST MEDICAL HISTORY:   Past Medical History: Past Medical History:  Diagnosis Date   Breast cancer ([place] [facility])    left breast/ 2008/ surg/ rad tx   Coronary artery disease    Diabetes mellitus     Surgical History: Past Surgical History:  Procedure Laterality Date    ABDOMINAL HYSTERECTOMY     BREAST SURGERY     DEBRIDEMENT MANDIBLE N/A 02/22/2020   Procedure: INCISION AND DRAINAGE WITH DEBRIDEMENT MANDIBLE;  Surgeon: Michael Litter, DMD;  Location: [facility];  Service: Oral Surgery;  Laterality: N/A;   DEBRIDEMENT MANDIBLE Right 03/19/2021   Procedure: DEBRIDEMENT OF BONE RIGHT INTERIOR  MANDIBLE;  Surgeon: Michael Litter, DMD;  Location: [facility];  Service: Oral Surgery;  Laterality: Right;   EYE SURGERY  2021   cataract removals    TOOTH EXTRACTION N/A 02/22/2020   Procedure: DENTAL RESTORATION/EXTRACTIONS;  Surgeon: Michael Litter, DMD;  Location: [facility];  Service: Oral Surgery;  Laterality: N/A;  DENTAL KIT REQUESTED    Social History: Social History   Socioeconomic History   Marital status: Divorced    Spouse name: Not on file   Number of children: Not on file   Years of education: Not on file   Highest education level: Not on file  Occupational History   Not on file  Tobacco Use   Smoking status: Never   Smokeless tobacco: Never  Vaping Use   Vaping Use: Never used  Substance and Sexual Activity   Alcohol use: No   Drug use: No   Sexual activity: Yes    Birth control/protection: Surgical  Other Topics Concern   Not on file  Social History Narrative   Not on file   Social Determinants of Health   Financial Resource Strain: Low Risk  (12/09/2020)   Overall Financial Resource Strain (CARDIA)    Difficulty of Paying Living Expenses: Not hard at all  Food Insecurity: No Food Insecurity (12/09/2020)   Hunger Vital Sign    Worried About Running Out of Food in the Last Year: Never true    Ran Out of Food in the Last Year: Never true  Transportation Needs: No Transportation Needs (12/09/2020)   PRAPARE - Hydrologist (Medical): No    Lack of Transportation (Non-Medical): No  Physical Activity: Inactive (12/09/2020)   Exercise Vital Sign    Days of Exercise per Week: 0 days    Minutes of Exercise per Session: 0  min  Stress: No Stress Concern Present (12/09/2020)   [place]    Feeling of Stress : Not at all  Social Connections: Moderately Isolated (12/09/2020)   Social Connection and Isolation Panel [NHANES]    Frequency of Communication with Friends and Family: More than three times a week    Frequency of Social Gatherings with Friends and Family: More than three times a week    Attends Religious Services: More than 4 times per year    Active Member of [employer] or Organizations: No    Attends Archivist Meetings: Never    Marital Status: Divorced  Human resources officer Violence: Not At Risk (12/09/2020)   Humiliation, Afraid, Rape, and Kick questionnaire    Fear  of Current or Ex-Partner: No    Emotionally Abused: No    Physically Abused: No    Sexually Abused: No    Family History: Family History  Problem Relation Age of Onset   Obesity Sister     Current Medications:  Current Outpatient Medications:    acyclovir (ZOVIRAX) 400 MG tablet, Take 400 mg by mouth 2 (two) times daily., Disp: , Rfl:    aspirin 81 MG tablet, Take 1 tablet (81 mg total) by mouth daily with breakfast., Disp: 30 tablet, Rfl: 5   bortezomib IV (VELCADE) 3.5 MG injection, 3.5 mg once a week. weekly, Disp: , Rfl:    chlorhexidine (PERIDEX) 0.12 % solution, Use as directed 15 mLs in the mouth or throat 3 (three) times daily., Disp: , Rfl:    cholecalciferol (VITAMIN D) 25 MCG (1000 UNIT) tablet, 1 capsule, Disp: , Rfl:    glucose blood (ACCU-CHEK AVIVA PLUS) test strip, CHECK BLOOD SUGAR ONCE DAILY, Disp: , Rfl:    GNP ASPIRIN LOW DOSE 81 MG tablet, Take 81 mg by mouth daily., Disp: , Rfl:    magnesium oxide (MAG-OX) 400 (240 Mg) MG tablet, TAKE (1) TABLET BY MOUTH THREE TIMES DAILY, MORNING, NOON AND BEDTIME., Disp: 90 tablet, Rfl: 3   mirtazapine (REMERON) 15 MG tablet, Take 0.5 tablets (7.5 mg total) by mouth at bedtime. For appetite stimulation,  Disp: 30 tablet, Rfl: 2   ondansetron (ZOFRAN) 8 MG tablet, , Disp: , Rfl:    Potassium Acetate POWD, Take 20 mEq by mouth daily., Disp: 12000 g, Rfl: 5   potassium chloride (KLOR-CON) 20 MEQ packet, MIX AND TAKE ONE PACKET DAILY., Disp: 30 packet, Rfl: 3   Allergies: Allergies  Allergen Reactions   Other Other (See Comments)   Seasonal Ic [Cholestatin] Other (See Comments)    Sneezing, watery eyes   Motrin [Ibuprofen] Rash    REVIEW OF SYSTEMS:   Review of Systems  Constitutional:  Positive for fatigue. Negative for chills and fever.  HENT:   Negative for lump/mass, mouth sores, nosebleeds, sore throat and trouble swallowing.   Eyes:  Negative for eye problems.  Respiratory:  Negative for cough and shortness of breath.   Cardiovascular:  Negative for chest pain, leg swelling and palpitations.  Gastrointestinal:  Negative for abdominal pain, constipation, diarrhea, nausea and vomiting.  Genitourinary:  Negative for bladder incontinence, difficulty urinating, dysuria, frequency, hematuria and nocturia.   Musculoskeletal:  Negative for arthralgias, back pain, flank pain, myalgias and neck pain.  Skin:  Negative for itching and rash.  Neurological:  Negative for dizziness, headaches and numbness.  Hematological:  Does not bruise/bleed easily.  Psychiatric/Behavioral:  Negative for depression, sleep disturbance and suicidal ideas. The patient is not nervous/anxious.   All other systems reviewed and are negative.    VITALS:   Blood pressure (!) 159/78, pulse 80, temperature 97.6 F (36.4 C), temperature source Tympanic, resp. rate 18, weight 160 lb 0.9 oz (72.6 kg), SpO2 99 %.  Wt Readings from Last 3 Encounters:  02/10/23 160 lb 0.9 oz (72.6 kg)  01/26/23 160 lb (72.6 kg)  12/22/22 162 lb (73.5 kg)    Body mass index is 29.08 kg/m.  Performance status (ECOG): 1 - Symptomatic but completely ambulatory  PHYSICAL EXAM:   Physical Exam Vitals and nursing note reviewed. Exam  conducted with a chaperone present.  Constitutional:      Appearance: Normal appearance.  Cardiovascular:     Rate and Rhythm: Normal rate and regular rhythm.  Pulses: Normal pulses.     Heart sounds: Normal heart sounds.  Pulmonary:     Effort: Pulmonary effort is normal.     Breath sounds: Normal breath sounds.  Abdominal:     Palpations: Abdomen is soft. There is no hepatomegaly, splenomegaly or mass.     Tenderness: There is no abdominal tenderness.  Musculoskeletal:     Right lower leg: No edema.     Left lower leg: No edema.  Lymphadenopathy:     Cervical: No cervical adenopathy.     Right cervical: No superficial, deep or posterior cervical adenopathy.    Left cervical: No superficial, deep or posterior cervical adenopathy.     Upper Body:     Right upper body: No supraclavicular or axillary adenopathy.     Left upper body: No supraclavicular or axillary adenopathy.  Neurological:     General: No focal deficit present.     Mental Status: She is alert and oriented to person, place, and time.  Psychiatric:        Mood and Affect: Mood normal.        Behavior: Behavior normal.     LABS:      Latest Ref Rng & Units 02/10/2023    8:32 AM 01/26/2023    9:12 AM 12/22/2022    9:23 AM  CBC  WBC 4.0 - 10.5 K/uL 5.0  4.1  5.6   Hemoglobin 12.0 - 15.0 g/dL 11.3  10.3  9.3   Hematocrit 36.0 - 46.0 % 33.8  32.2  28.4   Platelets 150 - 400 K/uL 196  235  166       Latest Ref Rng & Units 02/10/2023    8:32 AM 01/26/2023    9:12 AM 12/22/2022    9:23 AM  CMP  Glucose 70 - 99 mg/dL 245  306  366   BUN 8 - 23 mg/dL 13  18  15   $ Creatinine 0.44 - 1.00 mg/dL 0.87  1.10  1.06   Sodium 135 - 145 mmol/L 138  136  137   Potassium 3.5 - 5.1 mmol/L 3.4  3.6  3.3   Chloride 98 - 111 mmol/L 100  102  103   CO2 22 - 32 mmol/L 27  24  27   $ Calcium 8.9 - 10.3 mg/dL 9.2  9.4  9.0   Total Protein 6.5 - 8.1 g/dL 7.2  7.2  6.5   Total Bilirubin 0.3 - 1.2 mg/dL 0.9  0.3  0.6   Alkaline  Phos 38 - 126 U/L 118  103  89   AST 15 - 41 U/L 16  15  18   $ ALT 0 - 44 U/L 10  8  7      $ No results found for: "CEA1", "CEA" / No results found for: "CEA1", "CEA" No results found for: "PSA1" No results found for: "CAN199" No results found for: "CAN125"  Lab Results  Component Value Date   TOTALPROTELP 6.6 01/26/2023   ALBUMINELP 3.4 01/26/2023   A1GS 0.2 01/26/2023   A2GS 0.8 01/26/2023   BETS 1.2 01/26/2023   GAMS 1.0 01/26/2023   MSPIKE 0.3 (H) 01/26/2023   SPEI Comment 01/26/2023   Lab Results  Component Value Date   TIBC 107 (L) 01/26/2023   TIBC 100 (L) 11/17/2022   TIBC 112 (L) 07/21/2022   FERRITIN 339 (H) 01/26/2023   FERRITIN 562 (H) 11/17/2022   FERRITIN 138 07/21/2022   IRONPCTSAT 50 (H) 01/26/2023   IRONPCTSAT  23 11/17/2022   IRONPCTSAT 39 (H) 07/21/2022   Lab Results  Component Value Date   LDH 125 01/26/2023   LDH 144 11/17/2022   LDH 139 07/21/2022     STUDIES:   No results found.

## 2023-02-10 NOTE — Patient Instructions (Addendum)
[facility] Discharge Instructions   You were seen and examined today by Dr. Delton Coombes.  He reviewed the results of your lab work which are normal/stable.   He reviewed the results of your myeloma labs. Your light chains are elevated. We will recheck these in 4 weeks to see if there is improvement.   We will proceed with your treatment today.   Return as scheduled.      Thank you for choosing [facility] at [facility] to provide your oncology and hematology care.  To afford each patient quality time with our provider, please arrive at least 15 minutes before your scheduled appointment time.   If you have a lab appointment with the [facility] please come in thru the Main Entrance and check in at the main information desk.  You need to re-schedule your appointment should you arrive 10 or more minutes late.  We strive to give you quality time with our providers, and arriving late affects you and other patients whose appointments are after yours.  Also, if you no show three or more times for appointments you may be dismissed from the clinic at the providers discretion.     Again, thank you for choosing [facility].  Our hope is that these requests will decrease the amount of time that you wait before being seen by our physicians.       _____________________________________________________________  Should you have questions after your visit to [facility], please contact our office at [phone] and follow the prompts.  Our office hours are 8:00 a.m. and 4:30 p.m. Monday - Friday.  Please note that voicemails left after 4:00 p.m. may not be returned until the following business day.  We are closed weekends and major holidays.  You do have access to a nurse 24-7, just call the main number to the clinic [phone] and do not press any options, hold on the line and a nurse will answer the phone.    For  prescription refill requests, have your pharmacy contact our office and allow 72 hours.    Due to Covid, you will need to wear a mask upon entering the hospital. If you do not have a mask, a mask will be given to you at the Main Entrance upon arrival. For doctor visits, patients may have 1 support person age 72 or older with them. For treatment visits, patients can not have anyone with them due to social distancing guidelines and our immunocompromised population.

## 2023-02-11 ENCOUNTER — Other Ambulatory Visit: Payer: Self-pay

## 2023-02-16 ENCOUNTER — Encounter: Payer: Self-pay | Admitting: Hematology

## 2023-02-16 ENCOUNTER — Encounter (HOSPITAL_COMMUNITY): Payer: Self-pay | Admitting: Hematology

## 2023-02-20 ENCOUNTER — Other Ambulatory Visit: Payer: Self-pay

## 2023-02-23 ENCOUNTER — Inpatient Hospital Stay: Payer: Medicare Other

## 2023-02-23 ENCOUNTER — Inpatient Hospital Stay: Payer: Medicare Other | Admitting: Hematology

## 2023-02-24 ENCOUNTER — Inpatient Hospital Stay: Payer: Medicare Other

## 2023-02-24 VITALS — BP 166/84 | HR 79 | Temp 98.5°F | Resp 18 | Wt 160.0 lb

## 2023-02-24 DIAGNOSIS — C9000 Multiple myeloma not having achieved remission: Secondary | ICD-10-CM

## 2023-02-24 DIAGNOSIS — E538 Deficiency of other specified B group vitamins: Secondary | ICD-10-CM

## 2023-02-24 DIAGNOSIS — E876 Hypokalemia: Secondary | ICD-10-CM

## 2023-02-24 DIAGNOSIS — Z5112 Encounter for antineoplastic immunotherapy: Secondary | ICD-10-CM | POA: Diagnosis not present

## 2023-02-24 LAB — COMPREHENSIVE METABOLIC PANEL
ALT: 9 U/L (ref 0–44)
AST: 17 U/L (ref 15–41)
Albumin: 3.5 g/dL (ref 3.5–5.0)
Alkaline Phosphatase: 103 U/L (ref 38–126)
Anion gap: 9 (ref 5–15)
BUN: 14 mg/dL (ref 8–23)
CO2: 28 mmol/L (ref 22–32)
Calcium: 8.9 mg/dL (ref 8.9–10.3)
Chloride: 97 mmol/L — ABNORMAL LOW (ref 98–111)
Creatinine, Ser: 1.03 mg/dL — ABNORMAL HIGH (ref 0.44–1.00)
GFR, Estimated: 54 mL/min — ABNORMAL LOW (ref >60)
Glucose, Bld: 166 mg/dL — ABNORMAL HIGH (ref 70–99)
Potassium: 2.9 mmol/L — ABNORMAL LOW (ref 3.5–5.1)
Sodium: 134 mmol/L — ABNORMAL LOW (ref 135–145)
Total Bilirubin: 1 mg/dL (ref 0.3–1.2)
Total Protein: 7.2 g/dL (ref 6.5–8.1)

## 2023-02-24 LAB — CBC WITH DIFFERENTIAL/PLATELET
Abs Immature Granulocytes: 0.03 10*3/uL (ref 0.00–0.07)
Basophils Absolute: 0 10*3/uL (ref 0.0–0.1)
Basophils Relative: 0 %
Eosinophils Absolute: 0 10*3/uL (ref 0.0–0.5)
Eosinophils Relative: 0 %
HCT: 33.5 % — ABNORMAL LOW (ref 36.0–46.0)
Hemoglobin: 11.1 g/dL — ABNORMAL LOW (ref 12.0–15.0)
Immature Granulocytes: 0 %
Lymphocytes Relative: 13 %
Lymphs Abs: 1.2 10*3/uL (ref 0.7–4.0)
MCH: 33.5 pg (ref 26.0–34.0)
MCHC: 33.1 g/dL (ref 30.0–36.0)
MCV: 101.2 fL — ABNORMAL HIGH (ref 80.0–100.0)
Monocytes Absolute: 0.6 10*3/uL (ref 0.1–1.0)
Monocytes Relative: 6 %
Neutro Abs: 7.1 10*3/uL (ref 1.7–7.7)
Neutrophils Relative %: 81 %
Platelets: 214 10*3/uL (ref 150–400)
RBC: 3.31 MIL/uL — ABNORMAL LOW (ref 3.87–5.11)
RDW: 13.7 % (ref 11.5–15.5)
WBC: 8.9 10*3/uL (ref 4.0–10.5)
nRBC: 0 % (ref 0.0–0.2)

## 2023-02-24 LAB — MAGNESIUM: Magnesium: 1.7 mg/dL (ref 1.7–2.4)

## 2023-02-24 MED ORDER — DEXAMETHASONE 4 MG PO TABS
20.0000 mg | ORAL_TABLET | Freq: Once | ORAL | Status: AC
  Administered 2023-02-24: 20 mg via ORAL
  Filled 2023-02-24: qty 5

## 2023-02-24 MED ORDER — CYANOCOBALAMIN 1000 MCG/ML IJ SOLN
1000.0000 ug | Freq: Once | INTRAMUSCULAR | Status: AC
  Administered 2023-02-24: 1000 ug via INTRAMUSCULAR
  Filled 2023-02-24: qty 1

## 2023-02-24 MED ORDER — PROCHLORPERAZINE MALEATE 10 MG PO TABS
10.0000 mg | ORAL_TABLET | Freq: Once | ORAL | Status: AC
  Administered 2023-02-24: 10 mg via ORAL
  Filled 2023-02-24: qty 1

## 2023-02-24 MED ORDER — POTASSIUM CHLORIDE CRYS ER 20 MEQ PO TBCR
40.0000 meq | EXTENDED_RELEASE_TABLET | Freq: Once | ORAL | Status: AC
  Administered 2023-02-24: 40 meq via ORAL
  Filled 2023-02-24: qty 2

## 2023-02-24 MED ORDER — BORTEZOMIB CHEMO SQ INJECTION 3.5 MG (2.5MG/ML)
1.3000 mg/m2 | Freq: Once | INTRAMUSCULAR | Status: AC
  Administered 2023-02-24: 2.25 mg via SUBCUTANEOUS
  Filled 2023-02-24: qty 0.9

## 2023-02-24 NOTE — Progress Notes (Signed)
Potassium 2.9 today and Potassium 40 meq by mouth ordered per Dr. Delton Coombes standing orders.

## 2023-02-24 NOTE — Patient Instructions (Signed)
[facility]  Discharge Instructions: Thank you for choosing [facility] to provide your oncology and hematology care.  If you have a lab appointment with the [facility], please come in thru the Main Entrance and check in at the main information desk.  Wear comfortable clothing and clothing appropriate for easy access to any Portacath or PICC line.   We strive to give you quality time with your provider. You may need to reschedule your appointment if you arrive late (15 or more minutes).  Arriving late affects you and other patients whose appointments are after yours.  Also, if you miss three or more appointments without notifying the office, you may be dismissed from the clinic at the provider's discretion.      For prescription refill requests, have your pharmacy contact our office and allow 72 hours for refills to be completed.    Today you received the following chemotherapy and/or immunotherapy agents Velcade, b12 injection, and 40 mEq p.o x 1 dose   To help prevent nausea and vomiting after your treatment, we encourage you to take your nausea medication as directed.  Bortezomib Injection What is this medication? BORTEZOMIB (bor TEZ oh mib) treats lymphoma. It may also be used to treat multiple myeloma, a type of bone marrow cancer. It works by blocking a protein that causes cancer cells to grow and multiply. This helps to slow or stop the spread of cancer cells. This medicine may be used for other purposes; ask your health care provider or pharmacist if you have questions. COMMON BRAND NAME(S): Velcade What should I tell my care team before I take this medication? They need to know if you have any of these conditions: Dehydration Diabetes Heart disease Liver disease Tingling of the fingers or toes or other nerve disorder An unusual or allergic reaction to bortezomib, other medications, foods, dyes, or preservatives If you or your partner are  pregnant or trying to get pregnant Breastfeeding How should I use this medication? This medication is injected into a vein or under the skin. It is given by your care team in a hospital or clinic setting. Talk to your care team about the use of this medication in children. Special care may be needed. Overdosage: If you think you have taken too much of this medicine contact a poison control center or emergency room at once. NOTE: This medicine is only for you. Do not share this medicine with others. What if I miss a dose? Keep appointments for follow-up doses. It is important not to miss your dose. Call your care team if you are unable to keep an appointment. What may interact with this medication? Ketoconazole Rifampin This list may not describe all possible interactions. Give your health care provider a list of all the medicines, herbs, non-prescription drugs, or dietary supplements you use. Also tell them if you smoke, drink alcohol, or use illegal drugs. Some items may interact with your medicine. What should I watch for while using this medication? Your condition will be monitored carefully while you are receiving this medication. You may need blood work while taking this medication. This medication may affect your coordination, reaction time, or judgment. Do not drive or operate machinery until you know how this medication affects you. Sit up or stand slowly to reduce the risk of dizzy or fainting spells. Drinking alcohol with this medication can increase the risk of these side effects. This medication may increase your risk of getting an infection. Call  your care team for advice if you get a fever, chills, sore throat, or other symptoms of a cold or flu. Do not treat yourself. Try to avoid being around people who are sick. Check with your care team if you have severe diarrhea, nausea, and vomiting, or if you sweat a lot. The loss of too much body fluid may make it dangerous for you to take  this medication. Talk to your care team if you may be pregnant. Serious birth defects can occur if you take this medication during pregnancy and for 7 months after the last dose. You will need a negative pregnancy test before starting this medication. Contraception is recommended while taking this medication and for 7 months after the last dose. Your care team can help you find the option that works for you. If your partner can get pregnant, use a condom during sex while taking this medication and for 4 months after the last dose. Do not breastfeed while taking this medication and for 2 months after the last dose. This medication may cause infertility. Talk to your care team if you are concerned about your fertility. What side effects may I notice from receiving this medication? Side effects that you should report to your care team as soon as possible: Allergic reactions--skin rash, itching, hives, swelling of the face, lips, tongue, or throat Bleeding--bloody or black, tar-like stools, vomiting blood or brown material that looks like coffee grounds, red or dark brown urine, small red or purple spots on skin, unusual bruising or bleeding Bleeding in the brain--severe headache, stiff neck, confusion, dizziness, change in vision, numbness or weakness of the face, arm, or leg, trouble speaking, trouble walking, vomiting Bowel blockage--stomach cramping, unable to have a bowel movement or pass gas, loss of appetite, vomiting Heart failure--shortness of breath, swelling of the ankles, feet, or hands, sudden weight gain, unusual weakness or fatigue Infection--fever, chills, cough, sore throat, wounds that don't heal, pain or trouble when passing urine, general feeling of discomfort or being unwell Liver injury--right upper belly pain, loss of appetite, nausea, light-colored stool, dark yellow or brown urine, yellowing skin or eyes, unusual weakness or fatigue Low blood pressure--dizziness, feeling faint or  lightheaded, blurry vision Lung injury--shortness of breath or trouble breathing, cough, spitting up blood, chest pain, fever Pain, tingling, or numbness in the hands or feet Severe or prolonged diarrhea Stomach pain, bloody diarrhea, pale skin, unusual weakness or fatigue, decrease in the amount of urine, which may be signs of hemolytic uremic syndrome Sudden and severe headache, confusion, change in vision, seizures, which may be signs of posterior reversible encephalopathy syndrome (PRES) TTP--purple spots on the skin or inside the mouth, pale skin, yellowing skin or eyes, unusual weakness or fatigue, fever, fast or irregular heartbeat, confusion, change in vision, trouble speaking, trouble walking Tumor lysis syndrome (TLS)--nausea, vomiting, diarrhea, decrease in the amount of urine, dark urine, unusual weakness or fatigue, confusion, muscle pain or cramps, fast or irregular heartbeat, joint pain Side effects that usually do not require medical attention (report to your care team if they continue or are bothersome): Constipation Diarrhea Fatigue Loss of appetite Nausea This list may not describe all possible side effects. Call your doctor for medical advice about side effects. You may report side effects to FDA at 1-800-FDA-1088. Where should I keep my medication? This medication is given in a hospital or clinic. It will not be stored at home. NOTE: This sheet is a summary. It may not cover all possible information. If  you have questions about this medicine, talk to your doctor, pharmacist, or health care provider.  2023 Elsevier/Gold Standard (2022-05-13 00:00:00)   BELOW ARE SYMPTOMS THAT SHOULD BE REPORTED IMMEDIATELY: *FEVER GREATER THAN 100.4 F (38 C) OR HIGHER *CHILLS OR SWEATING *NAUSEA AND VOMITING THAT IS NOT CONTROLLED WITH YOUR NAUSEA MEDICATION *UNUSUAL SHORTNESS OF BREATH *UNUSUAL BRUISING OR BLEEDING *URINARY PROBLEMS (pain or burning when urinating, or frequent  urination) *BOWEL PROBLEMS (unusual diarrhea, constipation, pain near the anus) TENDERNESS IN MOUTH AND THROAT WITH OR WITHOUT PRESENCE OF ULCERS (sore throat, sores in mouth, or a toothache) UNUSUAL RASH, SWELLING OR PAIN  UNUSUAL VAGINAL DISCHARGE OR ITCHING   Items with * indicate a potential emergency and should be followed up as soon as possible or go to the Emergency Department if any problems should occur.  Please show the CHEMOTHERAPY ALERT CARD or IMMUNOTHERAPY ALERT CARD at check-in to the Emergency Department and triage nurse.  Should you have questions after your visit or need to cancel or reschedule your appointment, please contact [facility] [phone]  and follow the prompts.  Office hours are 8:00 a.m. to 4:30 p.m. Monday - Friday. Please note that voicemails left after 4:00 p.m. may not be returned until the following business day.  We are closed weekends and major holidays. You have access to a nurse at all times for urgent questions. Please call the main number to the clinic [phone] and follow the prompts.  For any non-urgent questions, you may also contact your provider using MyChart. We now offer e-Visits for anyone 52 and older to request care online for non-urgent symptoms. For details visit [email or web address].   Also download the MyChart app! Go to the app store, search "MyChart", open the app, select [facility], and log in with your MyChart username and password.

## 2023-02-24 NOTE — Progress Notes (Signed)
Pt presents today for Velcade, Retacrit, B12 injection, and 40 mEq potassium chloride p.o x 1 dose per provider's order. Vital signs and labs WNL for treatment. Pt's hemoglobin noted to be 11.1 today. No retacrit needed per parameters. Okay to proceed with treatment today.  Treatment given today per MD orders. Tolerated infusion without adverse affects. Vital signs stable. No complaints at this time. Discharged from clinic ambulatory in stable condition. Alert and oriented x 3. F/U with [facility] as scheduled.

## 2023-02-26 ENCOUNTER — Other Ambulatory Visit: Payer: Self-pay

## 2023-03-08 ENCOUNTER — Inpatient Hospital Stay: Payer: Medicare Other | Attending: Hematology

## 2023-03-08 DIAGNOSIS — C9000 Multiple myeloma not having achieved remission: Secondary | ICD-10-CM | POA: Diagnosis present

## 2023-03-08 DIAGNOSIS — E538 Deficiency of other specified B group vitamins: Secondary | ICD-10-CM | POA: Diagnosis not present

## 2023-03-08 DIAGNOSIS — Z5112 Encounter for antineoplastic immunotherapy: Secondary | ICD-10-CM | POA: Insufficient documentation

## 2023-03-08 LAB — CBC WITH DIFFERENTIAL/PLATELET
Abs Immature Granulocytes: 0.03 10*3/uL (ref 0.00–0.07)
Basophils Absolute: 0 10*3/uL (ref 0.0–0.1)
Basophils Relative: 0 %
Eosinophils Absolute: 0 10*3/uL (ref 0.0–0.5)
Eosinophils Relative: 0 %
HCT: 35.4 % — ABNORMAL LOW (ref 36.0–46.0)
Hemoglobin: 11.6 g/dL — ABNORMAL LOW (ref 12.0–15.0)
Immature Granulocytes: 0 %
Lymphocytes Relative: 13 %
Lymphs Abs: 1 10*3/uL (ref 0.7–4.0)
MCH: 33.2 pg (ref 26.0–34.0)
MCHC: 32.8 g/dL (ref 30.0–36.0)
MCV: 101.4 fL — ABNORMAL HIGH (ref 80.0–100.0)
Monocytes Absolute: 0.4 10*3/uL (ref 0.1–1.0)
Monocytes Relative: 6 %
Neutro Abs: 6 10*3/uL (ref 1.7–7.7)
Neutrophils Relative %: 81 %
Platelets: 260 10*3/uL (ref 150–400)
RBC: 3.49 MIL/uL — ABNORMAL LOW (ref 3.87–5.11)
RDW: 13.6 % (ref 11.5–15.5)
WBC: 7.5 10*3/uL (ref 4.0–10.5)
nRBC: 0 % (ref 0.0–0.2)

## 2023-03-08 LAB — COMPREHENSIVE METABOLIC PANEL
ALT: 10 U/L (ref 0–44)
AST: 15 U/L (ref 15–41)
Albumin: 3.6 g/dL (ref 3.5–5.0)
Alkaline Phosphatase: 107 U/L (ref 38–126)
Anion gap: 9 (ref 5–15)
BUN: 14 mg/dL (ref 8–23)
CO2: 29 mmol/L (ref 22–32)
Calcium: 9.1 mg/dL (ref 8.9–10.3)
Chloride: 99 mmol/L (ref 98–111)
Creatinine, Ser: 0.97 mg/dL (ref 0.44–1.00)
GFR, Estimated: 58 mL/min — ABNORMAL LOW (ref >60)
Glucose, Bld: 166 mg/dL — ABNORMAL HIGH (ref 70–99)
Potassium: 3 mmol/L — ABNORMAL LOW (ref 3.5–5.1)
Sodium: 137 mmol/L (ref 135–145)
Total Bilirubin: 0.8 mg/dL (ref 0.3–1.2)
Total Protein: 7.5 g/dL (ref 6.5–8.1)

## 2023-03-08 LAB — MAGNESIUM: Magnesium: 1.9 mg/dL (ref 1.7–2.4)

## 2023-03-09 ENCOUNTER — Inpatient Hospital Stay: Payer: Medicare Other | Admitting: Hematology

## 2023-03-09 ENCOUNTER — Inpatient Hospital Stay: Payer: Medicare Other

## 2023-03-09 LAB — KAPPA/LAMBDA LIGHT CHAINS
Kappa free light chain: 119.6 mg/L — ABNORMAL HIGH (ref 3.3–19.4)
Kappa, lambda light chain ratio: 5.72 — ABNORMAL HIGH (ref 0.26–1.65)
Lambda free light chains: 20.9 mg/L (ref 5.7–26.3)

## 2023-03-10 ENCOUNTER — Inpatient Hospital Stay: Payer: Medicare Other

## 2023-03-10 ENCOUNTER — Inpatient Hospital Stay (HOSPITAL_BASED_OUTPATIENT_CLINIC_OR_DEPARTMENT_OTHER): Payer: Medicare Other | Admitting: Hematology

## 2023-03-10 VITALS — BP 131/71 | HR 84 | Temp 98.8°F | Resp 18 | Ht 62.0 in | Wt 162.2 lb

## 2023-03-10 DIAGNOSIS — C9000 Multiple myeloma not having achieved remission: Secondary | ICD-10-CM

## 2023-03-10 DIAGNOSIS — Z5112 Encounter for antineoplastic immunotherapy: Secondary | ICD-10-CM | POA: Diagnosis not present

## 2023-03-10 DIAGNOSIS — E876 Hypokalemia: Secondary | ICD-10-CM

## 2023-03-10 MED ORDER — POTASSIUM CHLORIDE CRYS ER 20 MEQ PO TBCR
40.0000 meq | EXTENDED_RELEASE_TABLET | Freq: Once | ORAL | Status: AC
  Administered 2023-03-10: 40 meq via ORAL
  Filled 2023-03-10: qty 2

## 2023-03-10 MED ORDER — DEXAMETHASONE 4 MG PO TABS
20.0000 mg | ORAL_TABLET | Freq: Once | ORAL | Status: AC
  Administered 2023-03-10: 20 mg via ORAL
  Filled 2023-03-10: qty 5

## 2023-03-10 MED ORDER — PROCHLORPERAZINE MALEATE 10 MG PO TABS
10.0000 mg | ORAL_TABLET | Freq: Once | ORAL | Status: AC
  Administered 2023-03-10: 10 mg via ORAL
  Filled 2023-03-10: qty 1

## 2023-03-10 MED ORDER — BORTEZOMIB CHEMO SQ INJECTION 3.5 MG (2.5MG/ML)
1.3000 mg/m2 | Freq: Once | INTRAMUSCULAR | Status: AC
  Administered 2023-03-10: 2.25 mg via SUBCUTANEOUS
  Filled 2023-03-10: qty 0.9

## 2023-03-10 NOTE — Patient Instructions (Addendum)
[facility] at [facility] Discharge Instructions   You were seen and examined today by Dr. Delton Coombes.  He reviewed the results of your lab work which are normal/stable. Your potassium was low at 3.0. Your magnesium is normal. Your blood counts are stable.   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 They have not really changed from last time we checked.   We will proceed with your treatment today.   Return as scheduled.    Thank you for choosing [facility] at [facility] to provide your oncology and hematology care.  To afford each patient quality time with our provider, please arrive at least 15 minutes before your scheduled appointment time.   If you have a lab appointment with the [facility] please come in thru the Main Entrance and check in at the main information desk.  You need to re-schedule your appointment should you arrive 10 or more minutes late.  We strive to give you quality time with our providers, and arriving late affects you and other patients whose appointments are after yours.  Also, if you no show three or more times for appointments you may be dismissed from the clinic at the providers discretion.     Again, thank you for choosing [facility].  Our hope is that these requests will decrease the amount of time that you wait before being seen by our physicians.       _____________________________________________________________  Should you have questions after your visit to [facility], please contact our office at [phone] and follow the prompts.  Our office hours are 8:00 a.m. and 4:30 p.m. Monday - Friday.  Please note that voicemails left after 4:00 p.m. may not be returned until the following business day.  We are closed weekends and major holidays.  You do have access to a nurse 24-7, just call the main number to the clinic [phone] and do not press any options, hold on the line  and a nurse will answer the phone.    For prescription refill requests, have your pharmacy contact our office and allow 72 hours.    Due to Covid, you will need to wear a mask upon entering the hospital. If you do not have a mask, a mask will be given to you at the Main Entrance upon arrival. For doctor visits, patients may have 1 support person age 30 or older with them. For treatment visits, patients can not have anyone with them due to social distancing guidelines and our immunocompromised population.

## 2023-03-10 NOTE — Progress Notes (Signed)
Patient presents today for Velcade and Retacrit injections.  Patient is in satisfactory condition with no complaints voiced.  Vital signs are stable. Labs reviewed.  All labs are within treatment parameters.  Potassium is 3.0.  We will give Klor Con 40 mEq PO x one dose today per Dr. Delton Coombes. Hemoglobin is 11.6 so we will hold Retacrit per orders by Dr. Delton Coombes.  We will proceed with treatment per MD orders.   Patient tolerated Velcade injection with no complaints voiced.  Site clean and dry with no bruising or swelling noted.  No complaints of pain.  Discharged with vital signs stable and no signs or symptoms of distress noted.

## 2023-03-10 NOTE — Patient Instructions (Signed)
[facility]  Discharge Instructions: Thank you for choosing [facility] to provide your oncology and hematology care.  If you have a lab appointment with the Cancer Center, please come in thru the Main Entrance and check in at the main information desk.  Wear comfortable clothing and clothing appropriate for easy access to any Portacath or PICC line.   We strive to give you quality time with your provider. You may need to reschedule your appointment if you arrive late (15 or more minutes).  Arriving late affects you and other patients whose appointments are after yours.  Also, if you miss three or more appointments without notifying the office, you may be dismissed from the clinic at the provider's discretion.      For prescription refill requests, have your pharmacy contact our office and allow 72 hours for refills to be completed.    Today you received the following chemotherapy and/or immunotherapy agents Velcade.  Bortezomib Injection What is this medication? BORTEZOMIB (bor TEZ oh mib) treats lymphoma. It may also be used to treat multiple myeloma, a type of bone marrow cancer. It works by blocking a protein that causes cancer cells to grow and multiply. This helps to slow or stop the spread of cancer cells. This medicine may be used for other purposes; ask your health care provider or pharmacist if you have questions. COMMON BRAND NAME(S): Velcade What should I tell my care team before I take this medication? They need to know if you have any of these conditions: Dehydration Diabetes Heart disease Liver disease Tingling of the fingers or toes or other nerve disorder An unusual or allergic reaction to bortezomib, other medications, foods, dyes, or preservatives If you or your partner are pregnant or trying to get pregnant Breastfeeding How should I use this medication? This medication is injected into a vein or under the skin. It is given by your  care team in a hospital or clinic setting. Talk to your care team about the use of this medication in children. Special care may be needed. Overdosage: If you think you have taken too much of this medicine contact a poison control center or emergency room at once. NOTE: This medicine is only for you. Do not share this medicine with others. What if I miss a dose? Keep appointments for follow-up doses. It is important not to miss your dose. Call your care team if you are unable to keep an appointment. What may interact with this medication? Ketoconazole Rifampin This list may not describe all possible interactions. Give your health care provider a list of all the medicines, herbs, non-prescription drugs, or dietary supplements you use. Also tell them if you smoke, drink alcohol, or use illegal drugs. Some items may interact with your medicine. What should I watch for while using this medication? Your condition will be monitored carefully while you are receiving this medication. You may need blood work while taking this medication. This medication may affect your coordination, reaction time, or judgment. Do not drive or operate machinery until you know how this medication affects you. Sit up or stand slowly to reduce the risk of dizzy or fainting spells. Drinking alcohol with this medication can increase the risk of these side effects. This medication may increase your risk of getting an infection. Call your care team for advice if you get a fever, chills, sore throat, or other symptoms of a cold or flu. Do not treat yourself. Try to avoid being around   people who are sick. Check with your care team if you have severe diarrhea, nausea, and vomiting, or if you sweat a lot. The loss of too much body fluid may make it dangerous for you to take this medication. Talk to your care team if you may be pregnant. Serious birth defects can occur if you take this medication during pregnancy and for 7 months after the  last dose. You will need a negative pregnancy test before starting this medication. Contraception is recommended while taking this medication and for 7 months after the last dose. Your care team can help you find the option that works for you. If your partner can get pregnant, use a condom during sex while taking this medication and for 4 months after the last dose. Do not breastfeed while taking this medication and for 2 months after the last dose. This medication may cause infertility. Talk to your care team if you are concerned about your fertility. What side effects may I notice from receiving this medication? Side effects that you should report to your care team as soon as possible: Allergic reactions--skin rash, itching, hives, swelling of the face, lips, tongue, or throat Bleeding--bloody or black, tar-like stools, vomiting blood or [place] material that looks like coffee grounds, red or dark Sania Noy urine, small red or purple spots on skin, unusual bruising or bleeding Bleeding in the brain--severe headache, stiff neck, confusion, dizziness, change in vision, numbness or weakness of the face, arm, or leg, trouble speaking, trouble walking, vomiting Bowel blockage--stomach cramping, unable to have a bowel movement or pass gas, loss of appetite, vomiting Heart failure--shortness of breath, swelling of the ankles, feet, or hands, sudden weight gain, unusual weakness or fatigue Infection--fever, chills, cough, sore throat, wounds that don't heal, pain or trouble when passing urine, general feeling of discomfort or being unwell Liver injury--right upper belly pain, loss of appetite, nausea, light-colored stool, dark yellow or [place] Neu urine, yellowing skin or eyes, unusual weakness or fatigue Low blood pressure--dizziness, feeling faint or lightheaded, blurry vision Lung injury--shortness of breath or trouble breathing, cough, spitting up blood, chest pain, fever Pain, tingling, or numbness in the hands  or feet Severe or prolonged diarrhea Stomach pain, bloody diarrhea, pale skin, unusual weakness or fatigue, decrease in the amount of urine, which may be signs of hemolytic uremic syndrome Sudden and severe headache, confusion, change in vision, seizures, which may be signs of posterior reversible encephalopathy syndrome (PRES) TTP--purple spots on the skin or inside the mouth, pale skin, yellowing skin or eyes, unusual weakness or fatigue, fever, fast or irregular heartbeat, confusion, change in vision, trouble speaking, trouble walking Tumor lysis syndrome (TLS)--nausea, vomiting, diarrhea, decrease in the amount of urine, dark urine, unusual weakness or fatigue, confusion, muscle pain or cramps, fast or irregular heartbeat, joint pain Side effects that usually do not require medical attention (report to your care team if they continue or are bothersome): Constipation Diarrhea Fatigue Loss of appetite Nausea This list may not describe all possible side effects. Call your doctor for medical advice about side effects. You may report side effects to FDA at 1-800-FDA-1088. Where should I keep my medication? This medication is given in a hospital or clinic. It will not be stored at home. NOTE: This sheet is a summary. It may not cover all possible information. If you have questions about this medicine, talk to your doctor, pharmacist, or health care provider.  2023 Elsevier/Gold Standard (2022-05-13 00:00:00)        To help   prevent nausea and vomiting after your treatment, we encourage you to take your nausea medication as directed.  BELOW ARE SYMPTOMS THAT SHOULD BE REPORTED IMMEDIATELY: *FEVER GREATER THAN 100.4 F (38 C) OR HIGHER *CHILLS OR SWEATING *NAUSEA AND VOMITING THAT IS NOT CONTROLLED WITH YOUR NAUSEA MEDICATION *UNUSUAL SHORTNESS OF BREATH *UNUSUAL BRUISING OR BLEEDING *URINARY PROBLEMS (pain or burning when urinating, or frequent urination) *BOWEL PROBLEMS (unusual diarrhea,  constipation, pain near the anus) TENDERNESS IN MOUTH AND THROAT WITH OR WITHOUT PRESENCE OF ULCERS (sore throat, sores in mouth, or a toothache) UNUSUAL RASH, SWELLING OR PAIN  UNUSUAL VAGINAL DISCHARGE OR ITCHING   Items with * indicate a potential emergency and should be followed up as soon as possible or go to the Emergency Department if any problems should occur.  Please show the CHEMOTHERAPY ALERT CARD or IMMUNOTHERAPY ALERT CARD at check-in to the Emergency Department and triage nurse.  Should you have questions after your visit or need to cancel or reschedule your appointment, please contact [facility] [phone]  and follow the prompts.  Office hours are 8:00 a.m. to 4:30 p.m. Monday - Friday. Please note that voicemails left after 4:00 p.m. may not be returned until the following business day.  We are closed weekends and major holidays. You have access to a nurse at all times for urgent questions. Please call the main number to the clinic [phone] and follow the prompts.  For any non-urgent questions, you may also contact your provider using MyChart. We now offer e-Visits for anyone 18 and older to request care online for non-urgent symptoms. For details visit [email or web address].   Also download the MyChart app! Go to the app store, search "MyChart", open the app, select [facility], and log in with your MyChart username and password.   

## 2023-03-10 NOTE — Progress Notes (Signed)
[name] [address], [place] [address]    Clinic Day:  03/10/2023  Referring physician: [name], MD  Patient Care Team: Brittany Richard, MD as PCP - General (Internal Medicine) Brittany Jack, MD as Medical Oncologist (Hematology)   ASSESSMENT & PLAN:   Assessment: 1.  IgA kappa plasma cell myeloma, stage I: -RVD started on 01/09/2018, held since 02/14/2020 due to mandible abscess. -Myeloma labs on 05/14/2020 showed progression with M spike of 0.5 g. -RVD started back on 05/21/2020. -Myeloma labs on 07/10/2020 shows M spike improved to 0.3 g from 0.6 g previously.  Free light chain ratio is 1.74 with kappa light chains 28.4. -Myeloma panel from 08/14/2020 shows M spike 0.4 g.  Kappa light chains are 24.8 and ratio is 2.23. - Revlimid (20 mg 2 weeks on/1 week off) and Velcade (3 weeks on/1 week off) were held after 02/26/2022 due to weight loss. - Velcade maintenance every 2 weeks started on 04/13/2022.   2.  Osteomyelitis of the right mandible/dental abscess: -Finished IV ceftriaxone on 04/03/2020.  Finished oral antibiotics. -We will hold Xgeva indefinitely.  Plan: 1.  IgA kappa plasma cell myeloma, stage I: - She is tolerating Velcade every other week along with dexamethasone 20 mg very well. - Myeloma labs on 01/26/2023 showed M spike stable at 0.3 but sudden increase in kappa light chains 01/28/2019 and ratio to 6.0. - I have reviewed repeat labs from 03/08/2023 which showed normal creatinine and calcium.  CBC was grossly normal.  Kappa light chains are again elevated at 119 and free light chain ratio at 5.76. - I will continue Velcade every 2 weeks at this time.  I plan to repeat SPEP and free light chains in 6 weeks.  If M spike also gets worse, will consider switching therapy.   2.  Severe hypokalemia: - Continue potassium liquid 3 times daily.  Potassium today is 3.0.   3.  Osteomyelitis of the right mandible/dental abscess: - Bisphosphonates  held due to ONJ.   4.  Hypomagnesemia: - Continue magnesium 3 times daily.   5.  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  Hemoglobin today is 11.3.  Orders Placed This Encounter  Procedures   Magnesium    Standing Status:   Future    Standing Expiration Date:   05/03/2024   CBC with Differential    Standing Status:   Future    Standing Expiration Date:   05/04/2024   Comprehensive metabolic panel    Standing Status:   Future    Standing Expiration Date:   05/04/2024   Magnesium    Standing Status:   Future    Standing Expiration Date:   05/17/2024   CBC with Differential    Standing Status:   Future    Standing Expiration Date:   05/18/2024   Comprehensive metabolic panel    Standing Status:   Future    Standing Expiration Date:   05/18/2024   Magnesium    Standing Status:   Future    Standing Expiration Date:   05/31/2024   CBC with Differential    Standing Status:   Future    Standing Expiration Date:   06/01/2024   Comprehensive metabolic panel    Standing Status:   Future    Standing Expiration Date:   06/01/2024     I,Brittany Archer,acting as a scribe for [employer], MD.,have documented all relevant documentation on the behalf of Brittany Jack, MD,as directed  by  Brittany Jack, MD while in the presence of Brittany Jack, MD.  I, Brittany Jack MD, have reviewed the above documentation for accuracy and completeness, and I agree with the above.   Brittany Jack, MD   3/13/20247:17 PM  CHIEF COMPLAINT:   Diagnosis: multiple myeloma     Cancer Staging  No matching staging information was found for the patient.   Prior Therapy: none  Current Therapy:   Maintenance Velcade every other week.   HISTORY OF PRESENT ILLNESS:   Oncology History  Multiple myeloma not having achieved remission ([place])  01/20/2018 Initial Diagnosis   Multiple myeloma not having achieved remission ([place])   01/26/2018 - 09/01/2022  Chemotherapy   Patient is on Treatment Plan : MYELOMA  RVD SQ (Bortezomib d 1,8,15 ) q28d x 4 cycles     04/13/2022 -  Chemotherapy   Patient is on Treatment Plan : MYELOMA MAINTENANCE Bortezomib SQ q14d        INTERVAL HISTORY:   Brittany Archer is a 82 y.o. female presenting to clinic today for follow up of multiple myeloma. She was last seen by me on 02/10/23.  Today, she states that she is doing well overall. Her appetite level is at 90%. Her energy level is at 70%.  PAST MEDICAL HISTORY:   Past Medical History: Past Medical History:  Diagnosis Date   Breast cancer ([place])    left breast/ 2008/ surg/ rad tx   Coronary artery disease    Diabetes mellitus     Surgical History: Past Surgical History:  Procedure Laterality Date   ABDOMINAL HYSTERECTOMY     BREAST SURGERY     DEBRIDEMENT MANDIBLE N/A 02/22/2020   Procedure: INCISION AND DRAINAGE WITH DEBRIDEMENT MANDIBLE;  Surgeon: Brittany Archer, DMD;  Location: [facility];  Service: Oral Surgery;  Laterality: N/A;   DEBRIDEMENT MANDIBLE Right 03/19/2021   Procedure: DEBRIDEMENT OF BONE RIGHT INTERIOR  MANDIBLE;  Surgeon: Brittany Archer, DMD;  Location: [facility];  Service: Oral Surgery;  Laterality: Right;   EYE SURGERY  2021   cataract removals    TOOTH EXTRACTION N/A 02/22/2020   Procedure: DENTAL RESTORATION/EXTRACTIONS;  Surgeon: Brittany Archer, DMD;  Location: [facility];  Service: Oral Surgery;  Laterality: N/A;  DENTAL KIT REQUESTED    Social History: Social History   Socioeconomic History   Marital status: Divorced    Spouse name: Not on file   Number of children: Not on file   Years of education: Not on file   Highest education level: Not on file  Occupational History   Not on file  Tobacco Use   Smoking status: Never   Smokeless tobacco: Never  Vaping Use   Vaping Use: Never used  Substance and Sexual Activity   Alcohol use: No   Drug use: No   Sexual activity: Yes    Birth control/protection: Surgical  Other Topics Concern    Not on file  Social History Narrative   Not on file   Social Determinants of Health   Financial Resource Strain: Low Risk  (12/09/2020)   Overall Financial Resource Strain (CARDIA)    Difficulty of Paying Living Expenses: Not hard at all  Food Insecurity: No Food Insecurity (12/09/2020)   Hunger Vital Sign    Worried About Running Out of Food in the Last Year: Never true    Ran Out of Food in the Last Year: Never true  Transportation Needs: No Transportation Needs (12/09/2020)   PRAPARE - Transportation    Lack of  Transportation (Medical): No    Lack of Transportation (Non-Medical): No  Physical Activity: Inactive (12/09/2020)   Exercise Vital Sign    Days of Exercise per Week: 0 days    Minutes of Exercise per Session: 0 min  Stress: No Stress Concern Present (12/09/2020)   [place]    Feeling of Stress : Not at all  Social Connections: Moderately Isolated (12/09/2020)   Social Connection and Isolation Panel [NHANES]    Frequency of Communication with Friends and Family: More than three times a week    Frequency of Social Gatherings with Friends and Family: More than three times a week    Attends Religious Services: More than 4 times per year    Active Member of [employer] or Organizations: No    Attends Archivist Meetings: Never    Marital Status: Divorced  Human resources officer Violence: Not At Risk (12/09/2020)   Humiliation, Afraid, Rape, and Kick questionnaire    Fear of Current or Ex-Partner: No    Emotionally Abused: No    Physically Abused: No    Sexually Abused: No    Family History: Family History  Problem Relation Age of Onset   Obesity Sister     Current Medications:  Current Outpatient Medications:    acyclovir (ZOVIRAX) 400 MG tablet, Take 400 mg by mouth 2 (two) times daily., Disp: , Rfl:    aspirin 81 MG tablet, Take 1 tablet (81 mg total) by mouth daily with breakfast., Disp: 30  tablet, Rfl: 5   bortezomib IV (VELCADE) 3.5 MG injection, 3.5 mg once a week. weekly, Disp: , Rfl:    chlorhexidine (PERIDEX) 0.12 % solution, Use as directed 15 mLs in the mouth or throat 3 (three) times daily., Disp: , Rfl:    cholecalciferol (VITAMIN D) 25 MCG (1000 UNIT) tablet, 1 capsule, Disp: , Rfl:    glucose blood (ACCU-CHEK AVIVA PLUS) test strip, CHECK BLOOD SUGAR ONCE DAILY, Disp: , Rfl:    GNP ASPIRIN LOW DOSE 81 MG tablet, Take 81 mg by mouth daily., Disp: , Rfl:    magnesium oxide (MAG-OX) 400 (240 Mg) MG tablet, TAKE (1) TABLET BY MOUTH THREE TIMES DAILY, MORNING, NOON AND BEDTIME., Disp: 90 tablet, Rfl: 3   mirtazapine (REMERON) 15 MG tablet, Take 0.5 tablets (7.5 mg total) by mouth at bedtime. For appetite stimulation, Disp: 30 tablet, Rfl: 2   ondansetron (ZOFRAN) 8 MG tablet, , Disp: , Rfl:    Potassium Acetate POWD, Take 20 mEq by mouth daily., Disp: 12000 g, Rfl: 5   potassium chloride (KLOR-CON) 20 MEQ packet, MIX AND TAKE ONE PACKET DAILY., Disp: 30 packet, Rfl: 3   Allergies: Allergies  Allergen Reactions   Other Other (See Comments)   Seasonal Ic [Cholestatin] Other (See Comments)    Sneezing, watery eyes   Motrin [Ibuprofen] Rash    REVIEW OF SYSTEMS:   Review of Systems  Constitutional:  Negative for chills, fatigue and fever.  HENT:   Negative for lump/mass, mouth sores, nosebleeds, sore throat and trouble swallowing.   Eyes:  Negative for eye problems.  Respiratory:  Negative for cough and shortness of breath.   Cardiovascular:  Negative for chest pain, leg swelling and palpitations.  Gastrointestinal:  Negative for abdominal pain, constipation, diarrhea, nausea and vomiting.  Genitourinary:  Negative for bladder incontinence, difficulty urinating, dysuria, frequency, hematuria and nocturia.   Musculoskeletal:  Negative for arthralgias, back pain, flank pain, myalgias and neck pain.  Skin:  Negative for itching and rash.  Neurological:  Negative for  dizziness, headaches and numbness.  Hematological:  Does not bruise/bleed easily.  Psychiatric/Behavioral:  Negative for depression, sleep disturbance and suicidal ideas. The patient is not nervous/anxious.   All other systems reviewed and are negative.    VITALS:   Blood pressure 131/71, pulse 84, temperature 98.8 F (37.1 C), temperature source Oral, resp. rate 18, height '5\' 2"'$  (1.575 m), weight 162 lb 3.2 oz (73.6 kg), SpO2 99 %.  Wt Readings from Last 3 Encounters:  03/10/23 162 lb 3.2 oz (73.6 kg)  02/24/23 160 lb (72.6 kg)  02/10/23 160 lb 0.9 oz (72.6 kg)    Body mass index is 29.67 kg/m.  Performance status (ECOG): 1 - Symptomatic but completely ambulatory  PHYSICAL EXAM:   Physical Exam Vitals and nursing note reviewed. Exam conducted with a chaperone present.  Constitutional:      Appearance: Normal appearance.  Cardiovascular:     Rate and Rhythm: Normal rate and regular rhythm.     Pulses: Normal pulses.     Heart sounds: Normal heart sounds.  Pulmonary:     Effort: Pulmonary effort is normal.     Breath sounds: Normal breath sounds.  Abdominal:     Palpations: Abdomen is soft. There is no hepatomegaly, splenomegaly or mass.     Tenderness: There is no abdominal tenderness.  Musculoskeletal:     Right lower leg: No edema.     Left lower leg: No edema.  Lymphadenopathy:     Cervical: No cervical adenopathy.     Right cervical: No superficial, deep or posterior cervical adenopathy.    Left cervical: No superficial, deep or posterior cervical adenopathy.     Upper Body:     Right upper body: No supraclavicular or axillary adenopathy.     Left upper body: No supraclavicular or axillary adenopathy.  Neurological:     General: No focal deficit present.     Mental Status: She is alert and oriented to person, place, and time.  Psychiatric:        Mood and Affect: Mood normal.        Behavior: Behavior normal.     LABS:      Latest Ref Rng & Units  03/08/2023   10:41 AM 02/24/2023   11:30 AM 02/10/2023    8:32 AM  CBC  WBC 4.0 - 10.5 K/uL 7.5  8.9  5.0   Hemoglobin 12.0 - 15.0 g/dL 11.6  11.1  11.3   Hematocrit 36.0 - 46.0 % 35.4  33.5  33.8   Platelets 150 - 400 K/uL 260  214  196       Latest Ref Rng & Units 03/08/2023   10:41 AM 02/24/2023   11:30 AM 02/10/2023    8:32 AM  CMP  Glucose 70 - 99 mg/dL 166  166  245   BUN 8 - 23 mg/dL '14  14  13   '$ Creatinine 0.44 - 1.00 mg/dL 0.97  1.03  0.87   Sodium 135 - 145 mmol/L 137  134  138   Potassium 3.5 - 5.1 mmol/L 3.0  2.9  3.4   Chloride 98 - 111 mmol/L 99  97  100   CO2 22 - 32 mmol/L '29  28  27   '$ Calcium 8.9 - 10.3 mg/dL 9.1  8.9  9.2   Total Protein 6.5 - 8.1 g/dL 7.5  7.2  7.2   Total Bilirubin 0.3 - 1.2  mg/dL 0.8  1.0  0.9   Alkaline Phos 38 - 126 U/L 107  103  118   AST 15 - 41 U/L '15  17  16   '$ ALT 0 - 44 U/L '10  9  10      '$ No results found for: "CEA1", "CEA" / No results found for: "CEA1", "CEA" No results found for: "PSA1" No results found for: "[id number]" No results found for: "CAN125"  Lab Results  Component Value Date   TOTALPROTELP 6.6 01/26/2023   ALBUMINELP 3.4 01/26/2023   A1GS 0.2 01/26/2023   A2GS 0.8 01/26/2023   BETS 1.2 01/26/2023   GAMS 1.0 01/26/2023   MSPIKE 0.3 (H) 01/26/2023   SPEI Comment 01/26/2023   Lab Results  Component Value Date   TIBC 107 (L) 01/26/2023   TIBC 100 (L) 11/17/2022   TIBC 112 (L) 07/21/2022   FERRITIN 339 (H) 01/26/2023   FERRITIN 562 (H) 11/17/2022   FERRITIN 138 07/21/2022   IRONPCTSAT 50 (H) 01/26/2023   IRONPCTSAT 23 11/17/2022   IRONPCTSAT 39 (H) 07/21/2022   Lab Results  Component Value Date   LDH 125 01/26/2023   LDH 144 11/17/2022   LDH 139 07/21/2022     STUDIES:   No results found.

## 2023-03-10 NOTE — Progress Notes (Signed)
Patient has been examined by Dr. Katragadda. Vital signs and labs have been reviewed by MD - ANC, Creatinine, LFTs, hemoglobin, and platelets are within treatment parameters per M.D. - pt may proceed with treatment.  Primary RN and pharmacy notified.  

## 2023-03-23 ENCOUNTER — Inpatient Hospital Stay: Payer: Medicare Other | Admitting: Hematology

## 2023-03-23 ENCOUNTER — Inpatient Hospital Stay: Payer: Medicare Other

## 2023-03-24 ENCOUNTER — Inpatient Hospital Stay: Payer: Medicare Other

## 2023-03-24 VITALS — BP 154/77 | HR 78 | Temp 99.1°F | Resp 18 | Ht 62.0 in | Wt 168.1 lb

## 2023-03-24 DIAGNOSIS — Z5112 Encounter for antineoplastic immunotherapy: Secondary | ICD-10-CM | POA: Diagnosis not present

## 2023-03-24 DIAGNOSIS — C9000 Multiple myeloma not having achieved remission: Secondary | ICD-10-CM

## 2023-03-24 DIAGNOSIS — E538 Deficiency of other specified B group vitamins: Secondary | ICD-10-CM

## 2023-03-24 LAB — CBC WITH DIFFERENTIAL/PLATELET
Abs Immature Granulocytes: 0.02 10*3/uL (ref 0.00–0.07)
Basophils Absolute: 0 10*3/uL (ref 0.0–0.1)
Basophils Relative: 1 %
Eosinophils Absolute: 0 10*3/uL (ref 0.0–0.5)
Eosinophils Relative: 1 %
HCT: 32.2 % — ABNORMAL LOW (ref 36.0–46.0)
Hemoglobin: 10.6 g/dL — ABNORMAL LOW (ref 12.0–15.0)
Immature Granulocytes: 0 %
Lymphocytes Relative: 20 %
Lymphs Abs: 1 10*3/uL (ref 0.7–4.0)
MCH: 33.9 pg (ref 26.0–34.0)
MCHC: 32.9 g/dL (ref 30.0–36.0)
MCV: 102.9 fL — ABNORMAL HIGH (ref 80.0–100.0)
Monocytes Absolute: 0.4 10*3/uL (ref 0.1–1.0)
Monocytes Relative: 7 %
Neutro Abs: 3.4 10*3/uL (ref 1.7–7.7)
Neutrophils Relative %: 71 %
Platelets: 189 10*3/uL (ref 150–400)
RBC: 3.13 MIL/uL — ABNORMAL LOW (ref 3.87–5.11)
RDW: 13.8 % (ref 11.5–15.5)
WBC: 4.8 10*3/uL (ref 4.0–10.5)
nRBC: 0 % (ref 0.0–0.2)

## 2023-03-24 LAB — COMPREHENSIVE METABOLIC PANEL
ALT: 10 U/L (ref 0–44)
AST: 13 U/L — ABNORMAL LOW (ref 15–41)
Albumin: 3.4 g/dL — ABNORMAL LOW (ref 3.5–5.0)
Alkaline Phosphatase: 102 U/L (ref 38–126)
Anion gap: 7 (ref 5–15)
BUN: 16 mg/dL (ref 8–23)
CO2: 27 mmol/L (ref 22–32)
Calcium: 9.2 mg/dL (ref 8.9–10.3)
Chloride: 104 mmol/L (ref 98–111)
Creatinine, Ser: 1.06 mg/dL — ABNORMAL HIGH (ref 0.44–1.00)
GFR, Estimated: 52 mL/min — ABNORMAL LOW (ref >60)
Glucose, Bld: 239 mg/dL — ABNORMAL HIGH (ref 70–99)
Potassium: 3.5 mmol/L (ref 3.5–5.1)
Sodium: 138 mmol/L (ref 135–145)
Total Bilirubin: 0.5 mg/dL (ref 0.3–1.2)
Total Protein: 6.9 g/dL (ref 6.5–8.1)

## 2023-03-24 LAB — IRON AND TIBC
Iron: 51 ug/dL (ref 28–170)
Saturation Ratios: 44 % — ABNORMAL HIGH (ref 10.4–31.8)
TIBC: 117 ug/dL — ABNORMAL LOW (ref 250–450)
UIBC: 66 ug/dL

## 2023-03-24 LAB — FERRITIN: Ferritin: 257 ng/mL (ref 11–307)

## 2023-03-24 LAB — MAGNESIUM: Magnesium: 1.7 mg/dL (ref 1.7–2.4)

## 2023-03-24 LAB — LACTATE DEHYDROGENASE: LDH: 134 U/L (ref 98–192)

## 2023-03-24 MED ORDER — CYANOCOBALAMIN 1000 MCG/ML IJ SOLN
1000.0000 ug | Freq: Once | INTRAMUSCULAR | Status: AC
  Administered 2023-03-24: 1000 ug via INTRAMUSCULAR
  Filled 2023-03-24: qty 1

## 2023-03-24 MED ORDER — PROCHLORPERAZINE MALEATE 10 MG PO TABS
10.0000 mg | ORAL_TABLET | Freq: Once | ORAL | Status: AC
  Administered 2023-03-24: 10 mg via ORAL
  Filled 2023-03-24: qty 1

## 2023-03-24 MED ORDER — BORTEZOMIB CHEMO SQ INJECTION 3.5 MG (2.5MG/ML)
1.3000 mg/m2 | Freq: Once | INTRAMUSCULAR | Status: AC
  Administered 2023-03-24: 2.25 mg via SUBCUTANEOUS
  Filled 2023-03-24: qty 0.9

## 2023-03-24 MED ORDER — DEXAMETHASONE 4 MG PO TABS
20.0000 mg | ORAL_TABLET | Freq: Once | ORAL | Status: AC
  Administered 2023-03-24: 20 mg via ORAL
  Filled 2023-03-24: qty 5

## 2023-03-24 MED ORDER — EPOETIN ALFA-EPBX 20000 UNIT/ML IJ SOLN: 20000.0000 [IU] | Freq: Once | INTRAMUSCULAR | Status: DC

## 2023-03-24 NOTE — Patient Instructions (Signed)
[facility]  Discharge Instructions: Thank you for choosing [facility] to provide your oncology and hematology care.  If you have a lab appointment with the [facility], please come in thru the Main Entrance and check in at the main information desk.  Wear comfortable clothing and clothing appropriate for easy access to any Portacath or PICC line.   We strive to give you quality time with your provider. You may need to reschedule your appointment if you arrive late (15 or more minutes).  Arriving late affects you and other patients whose appointments are after yours.  Also, if you miss three or more appointments without notifying the office, you may be dismissed from the clinic at the provider's discretion.      For prescription refill requests, have your pharmacy contact our office and allow 72 hours for refills to be completed.    Today you received the following chemotherapy and/or immunotherapy agents Velcade injection. Bortezomib Injection What is this medication? BORTEZOMIB (bor TEZ oh mib) treats lymphoma. It may also be used to treat multiple myeloma, a type of bone marrow cancer. It works by blocking a protein that causes cancer cells to grow and multiply. This helps to slow or stop the spread of cancer cells. This medicine may be used for other purposes; ask your health care provider or pharmacist if you have questions. COMMON BRAND NAME(S): Velcade What should I tell my care team before I take this medication? They need to know if you have any of these conditions: Dehydration Diabetes Heart disease Liver disease Tingling of the fingers or toes or other nerve disorder An unusual or allergic reaction to bortezomib, other medications, foods, dyes, or preservatives If you or your partner are pregnant or trying to get pregnant Breastfeeding How should I use this medication? This medication is injected into a vein or under the skin. It is given  by your care team in a hospital or clinic setting. Talk to your care team about the use of this medication in children. Special care may be needed. Overdosage: If you think you have taken too much of this medicine contact a poison control center or emergency room at once. NOTE: This medicine is only for you. Do not share this medicine with others. What if I miss a dose? Keep appointments for follow-up doses. It is important not to miss your dose. Call your care team if you are unable to keep an appointment. What may interact with this medication? Ketoconazole Rifampin This list may not describe all possible interactions. Give your health care provider a list of all the medicines, herbs, non-prescription drugs, or dietary supplements you use. Also tell them if you smoke, drink alcohol, or use illegal drugs. Some items may interact with your medicine. What should I watch for while using this medication? Your condition will be monitored carefully while you are receiving this medication. You may need blood work while taking this medication. This medication may affect your coordination, reaction time, or judgment. Do not drive or operate machinery until you know how this medication affects you. Sit up or stand slowly to reduce the risk of dizzy or fainting spells. Drinking alcohol with this medication can increase the risk of these side effects. This medication may increase your risk of getting an infection. Call your care team for advice if you get a fever, chills, sore throat, or other symptoms of a cold or flu. Do not treat yourself. Try to avoid being around  people who are sick. Check with your care team if you have severe diarrhea, nausea, and vomiting, or if you sweat a lot. The loss of too much body fluid may make it dangerous for you to take this medication. Talk to your care team if you may be pregnant. Serious birth defects can occur if you take this medication during pregnancy and for 7 months  after the last dose. You will need a negative pregnancy test before starting this medication. Contraception is recommended while taking this medication and for 7 months after the last dose. Your care team can help you find the option that works for you. If your partner can get pregnant, use a condom during sex while taking this medication and for 4 months after the last dose. Do not breastfeed while taking this medication and for 2 months after the last dose. This medication may cause infertility. Talk to your care team if you are concerned about your fertility. What side effects may I notice from receiving this medication? Side effects that you should report to your care team as soon as possible: Allergic reactions--skin rash, itching, hives, swelling of the face, lips, tongue, or throat Bleeding--bloody or black, tar-like stools, vomiting blood or brown material that looks like coffee grounds, red or dark brown urine, small red or purple spots on skin, unusual bruising or bleeding Bleeding in the brain--severe headache, stiff neck, confusion, dizziness, change in vision, numbness or weakness of the face, arm, or leg, trouble speaking, trouble walking, vomiting Bowel blockage--stomach cramping, unable to have a bowel movement or pass gas, loss of appetite, vomiting Heart failure--shortness of breath, swelling of the ankles, feet, or hands, sudden weight gain, unusual weakness or fatigue Infection--fever, chills, cough, sore throat, wounds that don't heal, pain or trouble when passing urine, general feeling of discomfort or being unwell Liver injury--right upper belly pain, loss of appetite, nausea, light-colored stool, dark yellow or brown urine, yellowing skin or eyes, unusual weakness or fatigue Low blood pressure--dizziness, feeling faint or lightheaded, blurry vision Lung injury--shortness of breath or trouble breathing, cough, spitting up blood, chest pain, fever Pain, tingling, or numbness in  the hands or feet Severe or prolonged diarrhea Stomach pain, bloody diarrhea, pale skin, unusual weakness or fatigue, decrease in the amount of urine, which may be signs of hemolytic uremic syndrome Sudden and severe headache, confusion, change in vision, seizures, which may be signs of posterior reversible encephalopathy syndrome (PRES) TTP--purple spots on the skin or inside the mouth, pale skin, yellowing skin or eyes, unusual weakness or fatigue, fever, fast or irregular heartbeat, confusion, change in vision, trouble speaking, trouble walking Tumor lysis syndrome (TLS)--nausea, vomiting, diarrhea, decrease in the amount of urine, dark urine, unusual weakness or fatigue, confusion, muscle pain or cramps, fast or irregular heartbeat, joint pain Side effects that usually do not require medical attention (report to your care team if they continue or are bothersome): Constipation Diarrhea Fatigue Loss of appetite Nausea This list may not describe all possible side effects. Call your doctor for medical advice about side effects. You may report side effects to FDA at 1-800-FDA-1088. Where should I keep my medication? This medication is given in a hospital or clinic. It will not be stored at home. NOTE: This sheet is a summary. It may not cover all possible information. If you have questions about this medicine, talk to your doctor, pharmacist, or health care provider.  2023 Elsevier/Gold Standard (2022-05-13 00:00:00)       To help prevent  nausea and vomiting after your treatment, we encourage you to take your nausea medication as directed.  BELOW ARE SYMPTOMS THAT SHOULD BE REPORTED IMMEDIATELY: *FEVER GREATER THAN 100.4 F (38 C) OR HIGHER *CHILLS OR SWEATING *NAUSEA AND VOMITING THAT IS NOT CONTROLLED WITH YOUR NAUSEA MEDICATION *UNUSUAL SHORTNESS OF BREATH *UNUSUAL BRUISING OR BLEEDING *URINARY PROBLEMS (pain or burning when urinating, or frequent urination) *BOWEL PROBLEMS (unusual  diarrhea, constipation, pain near the anus) TENDERNESS IN MOUTH AND THROAT WITH OR WITHOUT PRESENCE OF ULCERS (sore throat, sores in mouth, or a toothache) UNUSUAL RASH, SWELLING OR PAIN  UNUSUAL VAGINAL DISCHARGE OR ITCHING   Items with * indicate a potential emergency and should be followed up as soon as possible or go to the Emergency Department if any problems should occur.  Please show the CHEMOTHERAPY ALERT CARD or IMMUNOTHERAPY ALERT CARD at check-in to the Emergency Department and triage nurse.  Should you have questions after your visit or need to cancel or reschedule your appointment, please contact [facility] [phone]  and follow the prompts.  Office hours are 8:00 a.m. to 4:30 p.m. Monday - Friday. Please note that voicemails left after 4:00 p.m. may not be returned until the following business day.  We are closed weekends and major holidays. You have access to a nurse at all times for urgent questions. Please call the main number to the clinic [phone] and follow the prompts.  For any non-urgent questions, you may also contact your provider using MyChart. We now offer e-Visits for anyone 59 and older to request care online for non-urgent symptoms. For details visit [email or web address].   Also download the MyChart app! Go to the app store, search "MyChart", open the app, select [facility], and log in with your MyChart username and password.

## 2023-03-24 NOTE — Progress Notes (Signed)
Patient presents today for Velcade injection, B12 injection and Retacrit injection. Blood pressure within parameters for treatment. Labs within parameters for treatment.  HGB 10.6 NO Retacrit given today.   Treatment given today per MD orders. Tolerated without adverse affects. Vital signs stable. No complaints at this time. Discharged from clinic ambulatory in stable condition. Alert and oriented x 3. F/U with [facility] as scheduled.

## 2023-03-25 LAB — KAPPA/LAMBDA LIGHT CHAINS
Kappa free light chain: 108.2 mg/L — ABNORMAL HIGH (ref 3.3–19.4)
Kappa, lambda light chain ratio: 5.36 — ABNORMAL HIGH (ref 0.26–1.65)
Lambda free light chains: 20.2 mg/L (ref 5.7–26.3)

## 2023-03-26 ENCOUNTER — Other Ambulatory Visit: Payer: Self-pay | Admitting: Hematology

## 2023-03-26 LAB — PROTEIN ELECTROPHORESIS, SERUM
A/G Ratio: 1 (ref 0.7–1.7)
Albumin ELP: 3.1 g/dL (ref 2.9–4.4)
Alpha-1-Globulin: 0.2 g/dL (ref 0.0–0.4)
Alpha-2-Globulin: 0.7 g/dL (ref 0.4–1.0)
Beta Globulin: 1.2 g/dL (ref 0.7–1.3)
Gamma Globulin: 1 g/dL (ref 0.4–1.8)
Globulin, Total: 3.2 g/dL (ref 2.2–3.9)
M-Spike, %: 0.4 g/dL — ABNORMAL HIGH
Total Protein ELP: 6.3 g/dL (ref 6.0–8.5)

## 2023-03-30 ENCOUNTER — Other Ambulatory Visit: Payer: Self-pay

## 2023-04-02 ENCOUNTER — Other Ambulatory Visit: Payer: Medicare Other

## 2023-04-05 ENCOUNTER — Other Ambulatory Visit: Payer: Medicare Other

## 2023-04-06 ENCOUNTER — Other Ambulatory Visit: Payer: Self-pay

## 2023-04-06 ENCOUNTER — Ambulatory Visit: Payer: Medicare Other

## 2023-04-06 ENCOUNTER — Ambulatory Visit: Payer: Medicare Other | Admitting: Hematology

## 2023-04-07 ENCOUNTER — Inpatient Hospital Stay: Payer: Medicare Other | Attending: Hematology

## 2023-04-07 ENCOUNTER — Inpatient Hospital Stay: Payer: Medicare Other

## 2023-04-07 ENCOUNTER — Ambulatory Visit: Payer: Medicare Other | Admitting: Hematology

## 2023-04-07 VITALS — BP 158/80 | HR 86 | Temp 97.6°F | Resp 20 | Wt 169.6 lb

## 2023-04-07 DIAGNOSIS — C9000 Multiple myeloma not having achieved remission: Secondary | ICD-10-CM | POA: Diagnosis present

## 2023-04-07 DIAGNOSIS — Z5112 Encounter for antineoplastic immunotherapy: Secondary | ICD-10-CM | POA: Diagnosis not present

## 2023-04-07 LAB — CBC WITH DIFFERENTIAL/PLATELET
Abs Immature Granulocytes: 0.01 10*3/uL (ref 0.00–0.07)
Basophils Absolute: 0 10*3/uL (ref 0.0–0.1)
Basophils Relative: 0 %
Eosinophils Absolute: 0.1 10*3/uL (ref 0.0–0.5)
Eosinophils Relative: 1 %
HCT: 34.3 % — ABNORMAL LOW (ref 36.0–46.0)
Hemoglobin: 11.2 g/dL — ABNORMAL LOW (ref 12.0–15.0)
Immature Granulocytes: 0 %
Lymphocytes Relative: 17 %
Lymphs Abs: 0.9 10*3/uL (ref 0.7–4.0)
MCH: 33.6 pg (ref 26.0–34.0)
MCHC: 32.7 g/dL (ref 30.0–36.0)
MCV: 103 fL — ABNORMAL HIGH (ref 80.0–100.0)
Monocytes Absolute: 0.6 10*3/uL (ref 0.1–1.0)
Monocytes Relative: 10 %
Neutro Abs: 4 10*3/uL (ref 1.7–7.7)
Neutrophils Relative %: 72 %
Platelets: 227 10*3/uL (ref 150–400)
RBC: 3.33 MIL/uL — ABNORMAL LOW (ref 3.87–5.11)
RDW: 14 % (ref 11.5–15.5)
WBC: 5.6 10*3/uL (ref 4.0–10.5)
nRBC: 0 % (ref 0.0–0.2)

## 2023-04-07 LAB — COMPREHENSIVE METABOLIC PANEL
ALT: 11 U/L (ref 0–44)
AST: 15 U/L (ref 15–41)
Albumin: 3.6 g/dL (ref 3.5–5.0)
Alkaline Phosphatase: 116 U/L (ref 38–126)
Anion gap: 8 (ref 5–15)
BUN: 17 mg/dL (ref 8–23)
CO2: 26 mmol/L (ref 22–32)
Calcium: 9.3 mg/dL (ref 8.9–10.3)
Chloride: 102 mmol/L (ref 98–111)
Creatinine, Ser: 1.14 mg/dL — ABNORMAL HIGH (ref 0.44–1.00)
GFR, Estimated: 48 mL/min — ABNORMAL LOW (ref >60)
Glucose, Bld: 243 mg/dL — ABNORMAL HIGH (ref 70–99)
Potassium: 3.5 mmol/L (ref 3.5–5.1)
Sodium: 136 mmol/L (ref 135–145)
Total Bilirubin: 0.8 mg/dL (ref 0.3–1.2)
Total Protein: 7.1 g/dL (ref 6.5–8.1)

## 2023-04-07 LAB — MAGNESIUM: Magnesium: 1.9 mg/dL (ref 1.7–2.4)

## 2023-04-07 MED ORDER — DEXAMETHASONE 4 MG PO TABS
20.0000 mg | ORAL_TABLET | Freq: Once | ORAL | Status: AC
  Administered 2023-04-07: 20 mg via ORAL
  Filled 2023-04-07: qty 5

## 2023-04-07 MED ORDER — PROCHLORPERAZINE MALEATE 10 MG PO TABS
10.0000 mg | ORAL_TABLET | Freq: Once | ORAL | Status: AC
  Administered 2023-04-07: 10 mg via ORAL
  Filled 2023-04-07: qty 1

## 2023-04-07 MED ORDER — BORTEZOMIB CHEMO SQ INJECTION 3.5 MG (2.5MG/ML)
1.3000 mg/m2 | Freq: Once | INTRAMUSCULAR | Status: AC
  Administered 2023-04-07: 2.25 mg via SUBCUTANEOUS
  Filled 2023-04-07: qty 0.9

## 2023-04-07 NOTE — Patient Instructions (Signed)
[facility]  Discharge Instructions: Thank you for choosing [facility] to provide your oncology and hematology care.  If you have a lab appointment with the Cancer Center - please note that after April 8th, 2024, all labs will be drawn in the cancer center.  You do not have to check in or register with the main entrance as you have in the past but will complete your check-in in the cancer center.  Wear comfortable clothing and clothing appropriate for easy access to any Portacath or PICC line.   We strive to give you quality time with your provider. You may need to reschedule your appointment if you arrive late (15 or more minutes).  Arriving late affects you and other patients whose appointments are after yours.  Also, if you miss three or more appointments without notifying the office, you may be dismissed from the clinic at the provider's discretion.      For prescription refill requests, have your pharmacy contact our office and allow 72 hours for refills to be completed.    Today you received the following chemotherapy and/or immunotherapy agents Velcade      To help prevent nausea and vomiting after your treatment, we encourage you to take your nausea medication as directed.  BELOW ARE SYMPTOMS THAT SHOULD BE REPORTED IMMEDIATELY: *FEVER GREATER THAN 100.4 F (38 C) OR HIGHER *CHILLS OR SWEATING *NAUSEA AND VOMITING THAT IS NOT CONTROLLED WITH YOUR NAUSEA MEDICATION *UNUSUAL SHORTNESS OF BREATH *UNUSUAL BRUISING OR BLEEDING *URINARY PROBLEMS (pain or burning when urinating, or frequent urination) *BOWEL PROBLEMS (unusual diarrhea, constipation, pain near the anus) TENDERNESS IN MOUTH AND THROAT WITH OR WITHOUT PRESENCE OF ULCERS (sore throat, sores in mouth, or a toothache) UNUSUAL RASH, SWELLING OR PAIN  UNUSUAL VAGINAL DISCHARGE OR ITCHING   Items with * indicate a potential emergency and should be followed up as soon as possible or go to the  Emergency Department if any problems should occur.  Please show the CHEMOTHERAPY ALERT CARD or IMMUNOTHERAPY ALERT CARD at check-in to the Emergency Department and triage nurse.  Should you have questions after your visit or need to cancel or reschedule your appointment, please contact [facility] [phone]  and follow the prompts.  Office hours are 8:00 a.m. to 4:30 p.m. Monday - Friday. Please note that voicemails left after 4:00 p.m. may not be returned until the following business day.  We are closed weekends and major holidays. You have access to a nurse at all times for urgent questions. Please call the main number to the clinic [phone] and follow the prompts.  For any non-urgent questions, you may also contact your provider using MyChart. We now offer e-Visits for anyone 9 and older to request care online for non-urgent symptoms. For details visit [email or web address].   Also download the MyChart app! Go to the app store, search "MyChart", open the app, select [facility], and log in with your MyChart username and password.

## 2023-04-07 NOTE — Progress Notes (Signed)
Patient presents today for Velcade and possible Retacrit per providers orders.  Vital signs within parameters for treatment.  Labs pending.  Hgb noted to be 11.2, no Retacrit today per providers order.  Labs within parameters for treatment.  Stable during administration without incident; injection site WNL; see MAR for injection details.  Patient tolerated procedure well and without incident.  No questions or complaints noted at this time.

## 2023-04-11 ENCOUNTER — Other Ambulatory Visit: Payer: Self-pay

## 2023-04-19 NOTE — Progress Notes (Signed)
[facility] [address] [place] [address]    Clinic Day:  04/20/2023  Referring physician: [name], MD  Patient Care Team: [name], MD as PCP - General (Internal Medicine) [name], MD as Medical Oncologist (Hematology)   ASSESSMENT & PLAN:   Assessment: 1.  IgA kappa plasma cell myeloma, stage I: -RVD started on 01/09/2018, held since 02/14/2020 due to mandible abscess. -Myeloma labs on 05/14/2020 showed progression with M spike of 0.5 g. -RVD started back on 05/21/2020. -Myeloma labs on 07/10/2020 shows M spike improved to 0.3 g from 0.6 g previously.  Free light chain ratio is 1.74 with kappa light chains 28.4. -Myeloma panel from 08/14/2020 shows M spike 0.4 g.  Kappa light chains are 24.8 and ratio is 2.23. - Revlimid (20 mg 2 weeks on/1 week off) and Velcade (3 weeks on/1 week off) were held after 02/26/2022 due to weight loss. - Velcade maintenance every 2 weeks started on 04/13/2022.   2.  Osteomyelitis of the right mandible/dental abscess: -Finished IV ceftriaxone on 04/03/2020.  Finished oral antibiotics. -We will hold Xgeva indefinitely.    Plan: 1.  IgA kappa plasma cell myeloma, stage I: - She is tolerating Velcade every other week along with dexamethasone 20 mg very well. - Myeloma labs from 03/24/2023: M spike is 0.4 g a slightly up from 0.3 g previously.  Kappa light chains are 108, down from 119 and ratio 5.36, slightly better from 5.72 previously. - Labs today: Normal LFTs with minimally elevated creatinine.  CBC grossly normal. - I will continue Velcade every 2 weeks with dexamethasone 20 mg.  I will reevaluate her in 8 weeks with multiple myeloma labs 2 weeks prior.  If there is any worsening, we will change therapy.   2.  Severe hypokalemia: - Continue liquid potassium 3 times daily.  Potassium today is 3.3.   3.  Osteomyelitis of the right mandible/dental abscess: - Bisphosphonates held due to ONJ.   4.   Hypomagnesemia: - Continue magnesium 3 times daily.  Magnesium is normal today.   5.  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  Hemoglobin is 11 today.    Orders Placed This Encounter  Procedures   Magnesium    Standing Status:   Future    Standing Expiration Date:   06/14/2024   CBC with Differential    Standing Status:   Future    Standing Expiration Date:   06/15/2024   Comprehensive metabolic panel    Standing Status:   Future    Standing Expiration Date:   06/15/2024   Magnesium    Standing Status:   Future    Standing Expiration Date:   06/28/2024   CBC with Differential    Standing Status:   Future    Standing Expiration Date:   06/29/2024   Comprehensive metabolic panel    Standing Status:   Future    Standing Expiration Date:   06/29/2024   Magnesium    Standing Status:   Future    Standing Expiration Date:   07/12/2024   CBC with Differential    Standing Status:   Future    Standing Expiration Date:   07/13/2024   Comprehensive metabolic panel    Standing Status:   Future    Standing Expiration Date:   07/13/2024   Magnesium    Standing Status:   Future    Standing Expiration Date:   07/26/2024   CBC with Differential  Standing Status:   Future    Standing Expiration Date:   07/27/2024   Comprehensive metabolic panel    Standing Status:   Future    Standing Expiration Date:   07/27/2024      I,[name],acting as a scribe for [name], MD.,have documented all relevant documentation on the behalf of [name], MD,as directed by  [name], MD while in the presence of [name], MD.   I, [name] MD, have reviewed the above documentation for accuracy and completeness, and I agree with the above.   [name], MD   4/23/20246:01 PM  CHIEF COMPLAINT:   Diagnosis: multiple myeloma    Cancer Staging  No matching staging information was found for the patient.    Prior Therapy: RVD 01/09/18 through 02/14/20, 05/21/20 through 02/26/22  Current Therapy:  Maintenance Velcade every other week.    HISTORY OF PRESENT ILLNESS:   Oncology History  Multiple myeloma not having achieved remission  01/20/2018 Initial Diagnosis   Multiple myeloma not having achieved remission (HCC)   01/26/2018 - 09/01/2022 Chemotherapy   Patient is on Treatment Plan : MYELOMA  RVD SQ (Bortezomib d 1,8,15 ) q28d x 4 cycles     04/13/2022 -  Chemotherapy   Patient is on Treatment Plan : MYELOMA MAINTENANCE Bortezomib SQ q14d        INTERVAL HISTORY:   Brittany Archer is a 82 y.o. female presenting to clinic today for follow up of multiple myeloma. She was last seen by me on 03/10/23.  Today, she states that she is doing well overall. Her appetite level is at 100%. Her energy level is at 75%.  PAST MEDICAL HISTORY:   Past Medical History: Past Medical History:  Diagnosis Date   Breast cancer (HCC)    left breast/ 2008/ surg/ rad tx   Coronary artery disease    Diabetes mellitus     Surgical History: Past Surgical History:  Procedure Laterality Date   ABDOMINAL HYSTERECTOMY     BREAST SURGERY     DEBRIDEMENT MANDIBLE N/A 02/22/2020   Procedure: INCISION AND DRAINAGE WITH DEBRIDEMENT MANDIBLE;  Surgeon: [name], DMD;  Location: [facility];  Service: Oral Surgery;  Laterality: N/A;   DEBRIDEMENT MANDIBLE Right 03/19/2021   Procedure: DEBRIDEMENT OF BONE RIGHT INTERIOR  MANDIBLE;  Surgeon: [name], DMD;  Location: [facility] OR;  Service: Oral Surgery;  Laterality: Right;   EYE SURGERY  2021   cataract removals    TOOTH EXTRACTION N/A 02/22/2020   Procedure: DENTAL RESTORATION/EXTRACTIONS;  Surgeon: [name], DMD;  Location: [facility];  Service: Oral Surgery;  Laterality: N/A;  DENTAL KIT REQUESTED    Social History: Social History   Socioeconomic History   Marital status: Divorced    Spouse name: Not on file   Number of children: Not on file   Years of education: Not on  file   Highest education level: Not on file  Occupational History   Not on file  Tobacco Use   Smoking status: Never   Smokeless tobacco: Never  Vaping Use   Vaping Use: Never used  Substance and Sexual Activity   Alcohol use: No   Drug use: No   Sexual activity: Yes    Birth control/protection: Surgical  Other Topics Concern   Not on file  Social History Narrative   Not on file   Social Determinants of Health   Financial Resource Strain: Low Risk  (12/09/2020)   Overall Financial Resource Strain (CARDIA)  Difficulty of Paying Living Expenses: Not hard at all  Food Insecurity: No Food Insecurity (12/09/2020)   Hunger Vital Sign    Worried About Running Out of Food in the Last Year: Never true    Ran Out of Food in the Last Year: Never true  Transportation Needs: No Transportation Needs (12/09/2020)   PRAPARE - Administrator, Civil Service (Medical): No    Lack of Transportation (Non-Medical): No  Physical Activity: Inactive (12/09/2020)   Exercise Vital Sign    Days of Exercise per Week: 0 days    Minutes of Exercise per Session: 0 min  Stress: No Stress Concern Present (12/09/2020)   [employer] of Occupational Health - Occupational Stress Questionnaire    Feeling of Stress : Not at all  Social Connections: Moderately Isolated (12/09/2020)   Social Connection and Isolation Panel [NHANES]    Frequency of Communication with Friends and Family: More than three times a week    Frequency of Social Gatherings with Friends and Family: More than three times a week    Attends Religious Services: More than 4 times per year    Active Member of [employer] or Organizations: No    Attends Banker Meetings: Never    Marital Status: Divorced  Catering manager Violence: Not At Risk (12/09/2020)   Humiliation, Afraid, Rape, and Kick questionnaire    Fear of Current or Ex-Partner: No    Emotionally Abused: No    Physically Abused: No    Sexually Abused:  No    Family History: Family History  Problem Relation Age of Onset   Obesity Sister     Current Medications:  Current Outpatient Medications:    acyclovir (ZOVIRAX) 400 MG tablet, Take 400 mg by mouth 2 (two) times daily., Disp: , Rfl:    aspirin 81 MG tablet, Take 1 tablet (81 mg total) by mouth daily with breakfast., Disp: 30 tablet, Rfl: 5   bortezomib IV (VELCADE) 3.5 MG injection, 3.5 mg once a week. weekly, Disp: , Rfl:    chlorhexidine (PERIDEX) 0.12 % solution, Use as directed 15 mLs in the mouth or throat 3 (three) times daily., Disp: , Rfl:    cholecalciferol (VITAMIN D) 25 MCG (1000 UNIT) tablet, 1 capsule, Disp: , Rfl:    glucose blood (ACCU-CHEK AVIVA PLUS) test strip, CHECK BLOOD SUGAR ONCE DAILY, Disp: , Rfl:    GNP ASPIRIN LOW DOSE 81 MG tablet, Take 81 mg by mouth daily., Disp: , Rfl:    magnesium oxide (MAG-OX) 400 (240 Mg) MG tablet, TAKE ONE TABLET BY MOUTH THREE TIMES A DAY, Disp: 90 tablet, Rfl: 3   mirtazapine (REMERON) 15 MG tablet, Take 0.5 tablets (7.5 mg total) by mouth at bedtime. For appetite stimulation, Disp: 30 tablet, Rfl: 2   Potassium Acetate POWD, Take 20 mEq by mouth daily., Disp: 12000 g, Rfl: 5   potassium chloride (KLOR-CON) 20 MEQ packet, MIX AND TAKE ONE PACKET DAILY., Disp: 30 packet, Rfl: 3   ondansetron (ZOFRAN) 8 MG tablet, , Disp: , Rfl:    Allergies: Allergies  Allergen Reactions   Other Other (See Comments)   Seasonal Ic [Cholestatin] Other (See Comments)    Sneezing, watery eyes   Motrin [Ibuprofen] Rash    REVIEW OF SYSTEMS:   Review of Systems  Constitutional:  Negative for chills, fatigue and fever.  HENT:   Negative for lump/mass, mouth sores, nosebleeds, sore throat and trouble swallowing.   Eyes:  Negative for eye problems.  Respiratory:  Negative for cough and shortness of breath.   Cardiovascular:  Negative for chest pain, leg swelling and palpitations.  Gastrointestinal:  Negative for abdominal pain, constipation,  diarrhea, nausea and vomiting.  Genitourinary:  Negative for bladder incontinence, difficulty urinating, dysuria, frequency, hematuria and nocturia.   Musculoskeletal:  Negative for arthralgias, back pain, flank pain, myalgias and neck pain.  Skin:  Negative for itching and rash.  Neurological:  Negative for dizziness, headaches and numbness.  Hematological:  Does not bruise/bleed easily.  Psychiatric/Behavioral:  Negative for depression, sleep disturbance and suicidal ideas. The patient is not nervous/anxious.   All other systems reviewed and are negative.    VITALS:   Blood pressure (!) 155/72, pulse 73, temperature 97.9 F (36.6 C), temperature source Oral, resp. rate 18, SpO2 98 %.  Wt Readings from Last 3 Encounters:  04/07/23 169 lb 9.6 oz (76.9 kg)  03/24/23 168 lb 1.3 oz (76.2 kg)  03/10/23 162 lb 3.2 oz (73.6 kg)    There is no height or weight on file to calculate BMI.  Performance status (ECOG): 1 - Symptomatic but completely ambulatory  PHYSICAL EXAM:   Physical Exam Vitals and nursing note reviewed. Exam conducted with a chaperone present.  Constitutional:      Appearance: Normal appearance.  Cardiovascular:     Rate and Rhythm: Normal rate and regular rhythm.     Pulses: Normal pulses.     Heart sounds: Normal heart sounds.  Pulmonary:     Effort: Pulmonary effort is normal.     Breath sounds: Normal breath sounds.  Abdominal:     Palpations: Abdomen is soft. There is no hepatomegaly, splenomegaly or mass.     Tenderness: There is no abdominal tenderness.  Musculoskeletal:     Right lower leg: No edema.     Left lower leg: No edema.  Lymphadenopathy:     Cervical: No cervical adenopathy.     Right cervical: No superficial, deep or posterior cervical adenopathy.    Left cervical: No superficial, deep or posterior cervical adenopathy.     Upper Body:     Right upper body: No supraclavicular or axillary adenopathy.     Left upper body: No supraclavicular or  axillary adenopathy.  Neurological:     General: No focal deficit present.     Mental Status: She is alert and oriented to person, place, and time.  Psychiatric:        Mood and Affect: Mood normal.        Behavior: Behavior normal.     LABS:      Latest Ref Rng & Units 04/20/2023    9:02 AM 04/07/2023    9:53 AM 03/24/2023   10:03 AM  CBC  WBC 4.0 - 10.5 K/uL 5.2  5.6  4.8   Hemoglobin 12.0 - 15.0 g/dL 16.1  09.6  04.5   Hematocrit 36.0 - 46.0 % 33.1  34.3  32.2   Platelets 150 - 400 K/uL 207  227  189       Latest Ref Rng & Units 04/20/2023    9:02 AM 04/07/2023    9:53 AM 03/24/2023   10:03 AM  CMP  Glucose 70 - 99 mg/dL 409  811  914   BUN 8 - 23 mg/dL 14  17  16    Creatinine 0.44 - 1.00 mg/dL 7.82  9.56  2.13   Sodium 135 - 145 mmol/L 137  136  138   Potassium 3.5 - 5.1 mmol/L  3.3  3.5  3.5   Chloride 98 - 111 mmol/L 101  102  104   CO2 22 - 32 mmol/L Calcium 8.9 - 10.3 mg/dL 9.3  9.3  9.2   Total Protein 6.5 - 8.1 g/dL 7.0  7.1  6.9   Total Bilirubin 0.3 - 1.2 mg/dL 0.7  0.8  0.5   Alkaline Phos 38 - 126 U/L 113  116  102   AST 15 - 41 U/L ALT 0 - 44 U/L No results found for: "CEA1", "CEA" / No results found for: "CEA1", "CEA" No results found for: "PSA1" No results found for: "WUJ811" No results found for: "CAN125"  Lab Results  Component Value Date   TOTALPROTELP 6.3 03/24/2023   ALBUMINELP 3.1 03/24/2023   A1GS 0.2 03/24/2023   A2GS 0.7 03/24/2023   BETS 1.2 03/24/2023   GAMS 1.0 03/24/2023   MSPIKE 0.4 (H) 03/24/2023   SPEI Comment 03/24/2023   Lab Results  Component Value Date   TIBC 117 (L) 03/24/2023   TIBC 107 (L) 01/26/2023   TIBC 100 (L) 11/17/2022   FERRITIN 257 03/24/2023   FERRITIN 339 (H) 01/26/2023   FERRITIN 562 (H) 11/17/2022   IRONPCTSAT 44 (H) 03/24/2023   IRONPCTSAT 50 (H) 01/26/2023   IRONPCTSAT 23 11/17/2022   Lab Results  Component Value Date   LDH 134 03/24/2023   LDH 125  01/26/2023   LDH 144 11/17/2022     STUDIES:   No results found.

## 2023-04-20 ENCOUNTER — Inpatient Hospital Stay (HOSPITAL_BASED_OUTPATIENT_CLINIC_OR_DEPARTMENT_OTHER): Payer: Medicare Other | Admitting: Hematology

## 2023-04-20 ENCOUNTER — Inpatient Hospital Stay: Payer: Medicare Other

## 2023-04-20 VITALS — BP 155/72 | HR 73 | Temp 97.9°F | Resp 18

## 2023-04-20 DIAGNOSIS — C9000 Multiple myeloma not having achieved remission: Secondary | ICD-10-CM

## 2023-04-20 DIAGNOSIS — Z5112 Encounter for antineoplastic immunotherapy: Secondary | ICD-10-CM | POA: Diagnosis not present

## 2023-04-20 DIAGNOSIS — E538 Deficiency of other specified B group vitamins: Secondary | ICD-10-CM

## 2023-04-20 LAB — CBC WITH DIFFERENTIAL/PLATELET
Abs Immature Granulocytes: 0.02 10*3/uL (ref 0.00–0.07)
Basophils Absolute: 0 10*3/uL (ref 0.0–0.1)
Basophils Relative: 0 %
Eosinophils Absolute: 0 10*3/uL (ref 0.0–0.5)
Eosinophils Relative: 1 %
HCT: 33.1 % — ABNORMAL LOW (ref 36.0–46.0)
Hemoglobin: 11 g/dL — ABNORMAL LOW (ref 12.0–15.0)
Immature Granulocytes: 0 %
Lymphocytes Relative: 22 %
Lymphs Abs: 1.2 10*3/uL (ref 0.7–4.0)
MCH: 33.6 pg (ref 26.0–34.0)
MCHC: 33.2 g/dL (ref 30.0–36.0)
MCV: 101.2 fL — ABNORMAL HIGH (ref 80.0–100.0)
Monocytes Absolute: 0.4 10*3/uL (ref 0.1–1.0)
Monocytes Relative: 7 %
Neutro Abs: 3.5 10*3/uL (ref 1.7–7.7)
Neutrophils Relative %: 70 %
Platelets: 207 10*3/uL (ref 150–400)
RBC: 3.27 MIL/uL — ABNORMAL LOW (ref 3.87–5.11)
RDW: 13.8 % (ref 11.5–15.5)
WBC: 5.2 10*3/uL (ref 4.0–10.5)
nRBC: 0 % (ref 0.0–0.2)

## 2023-04-20 LAB — COMPREHENSIVE METABOLIC PANEL
ALT: 10 U/L (ref 0–44)
AST: 16 U/L (ref 15–41)
Albumin: 3.3 g/dL — ABNORMAL LOW (ref 3.5–5.0)
Alkaline Phosphatase: 113 U/L (ref 38–126)
Anion gap: 10 (ref 5–15)
BUN: 14 mg/dL (ref 8–23)
CO2: 26 mmol/L (ref 22–32)
Calcium: 9.3 mg/dL (ref 8.9–10.3)
Chloride: 101 mmol/L (ref 98–111)
Creatinine, Ser: 1.08 mg/dL — ABNORMAL HIGH (ref 0.44–1.00)
GFR, Estimated: 51 mL/min — ABNORMAL LOW (ref >60)
Glucose, Bld: 257 mg/dL — ABNORMAL HIGH (ref 70–99)
Potassium: 3.3 mmol/L — ABNORMAL LOW (ref 3.5–5.1)
Sodium: 137 mmol/L (ref 135–145)
Total Bilirubin: 0.7 mg/dL (ref 0.3–1.2)
Total Protein: 7 g/dL (ref 6.5–8.1)

## 2023-04-20 LAB — MAGNESIUM: Magnesium: 1.7 mg/dL (ref 1.7–2.4)

## 2023-04-20 MED ORDER — DEXAMETHASONE 4 MG PO TABS
20.0000 mg | ORAL_TABLET | Freq: Once | ORAL | Status: AC
  Administered 2023-04-20: 20 mg via ORAL
  Filled 2023-04-20: qty 5

## 2023-04-20 MED ORDER — CYANOCOBALAMIN 1000 MCG/ML IJ SOLN
1000.0000 ug | Freq: Once | INTRAMUSCULAR | Status: AC
  Administered 2023-04-20: 1000 ug via INTRAMUSCULAR
  Filled 2023-04-20: qty 1

## 2023-04-20 MED ORDER — BORTEZOMIB CHEMO SQ INJECTION 3.5 MG (2.5MG/ML)
1.3000 mg/m2 | Freq: Once | INTRAMUSCULAR | Status: AC
  Administered 2023-04-20: 2.25 mg via SUBCUTANEOUS
  Filled 2023-04-20: qty 0.9

## 2023-04-20 MED ORDER — PROCHLORPERAZINE MALEATE 10 MG PO TABS
10.0000 mg | ORAL_TABLET | Freq: Once | ORAL | Status: AC
  Administered 2023-04-20: 10 mg via ORAL
  Filled 2023-04-20: qty 1

## 2023-04-20 NOTE — Patient Instructions (Signed)
[facility]  Discharge Instructions: Thank you for choosing [facility] to provide your oncology and hematology care.  If you have a lab appointment with the Cancer Center - please note that after April 8th, 2024, all labs will be drawn in the cancer center.  You do not have to check in or register with the main entrance as you have in the past but will complete your check-in in the cancer center.  Wear comfortable clothing and clothing appropriate for easy access to any Portacath or PICC line.   We strive to give you quality time with your provider. You may need to reschedule your appointment if you arrive late (15 or more minutes).  Arriving late affects you and other patients whose appointments are after yours.  Also, if you miss three or more appointments without notifying the office, you may be dismissed from the clinic at the provider's discretion.      For prescription refill requests, have your pharmacy contact our office and allow 72 hours for refills to be completed.    Today you received the following chemotherapy and/or immunotherapy agents Velcade injection. Bortezomib Injection What is this medication? BORTEZOMIB (bor TEZ oh mib) treats lymphoma. It may also be used to treat multiple myeloma, a type of bone marrow cancer. It works by blocking a protein that causes cancer cells to grow and multiply. This helps to slow or stop the spread of cancer cells. This medicine may be used for other purposes; ask your health care provider or pharmacist if you have questions. COMMON BRAND NAME(S): Velcade What should I tell my care team before I take this medication? They need to know if you have any of these conditions: Dehydration Diabetes Heart disease Liver disease Tingling of the fingers or toes or other nerve disorder An unusual or allergic reaction to bortezomib, other medications, foods, dyes, or preservatives If you or your partner are pregnant or  trying to get pregnant Breastfeeding How should I use this medication? This medication is injected into a vein or under the skin. It is given by your care team in a hospital or clinic setting. Talk to your care team about the use of this medication in children. Special care may be needed. Overdosage: If you think you have taken too much of this medicine contact a poison control center or emergency room at once. NOTE: This medicine is only for you. Do not share this medicine with others. What if I miss a dose? Keep appointments for follow-up doses. It is important not to miss your dose. Call your care team if you are unable to keep an appointment. What may interact with this medication? Ketoconazole Rifampin This list may not describe all possible interactions. Give your health care provider a list of all the medicines, herbs, non-prescription drugs, or dietary supplements you use. Also tell them if you smoke, drink alcohol, or use illegal drugs. Some items may interact with your medicine. What should I watch for while using this medication? Your condition will be monitored carefully while you are receiving this medication. You may need blood work while taking this medication. This medication may affect your coordination, reaction time, or judgment. Do not drive or operate machinery until you know how this medication affects you. Sit up or stand slowly to reduce the risk of dizzy or fainting spells. Drinking alcohol with this medication can increase the risk of these side effects. This medication may increase your risk of getting an infection.   Call your care team for advice if you get a fever, chills, sore throat, or other symptoms of a cold or flu. Do not treat yourself. Try to avoid being around people who are sick. Check with your care team if you have severe diarrhea, nausea, and vomiting, or if you sweat a lot. The loss of too much body fluid may make it dangerous for you to take this  medication. Talk to your care team if you may be pregnant. Serious birth defects can occur if you take this medication during pregnancy and for 7 months after the last dose. You will need a negative pregnancy test before starting this medication. Contraception is recommended while taking this medication and for 7 months after the last dose. Your care team can help you find the option that works for you. If your partner can get pregnant, use a condom during sex while taking this medication and for 4 months after the last dose. Do not breastfeed while taking this medication and for 2 months after the last dose. This medication may cause infertility. Talk to your care team if you are concerned about your fertility. What side effects may I notice from receiving this medication? Side effects that you should report to your care team as soon as possible: Allergic reactions--skin rash, itching, hives, swelling of the face, lips, tongue, or throat Bleeding--bloody or black, tar-like stools, vomiting blood or brown material that looks like coffee grounds, red or dark brown urine, small red or purple spots on skin, unusual bruising or bleeding Bleeding in the brain--severe headache, stiff neck, confusion, dizziness, change in vision, numbness or weakness of the face, arm, or leg, trouble speaking, trouble walking, vomiting Bowel blockage--stomach cramping, unable to have a bowel movement or pass gas, loss of appetite, vomiting Heart failure--shortness of breath, swelling of the ankles, feet, or hands, sudden weight gain, unusual weakness or fatigue Infection--fever, chills, cough, sore throat, wounds that don't heal, pain or trouble when passing urine, general feeling of discomfort or being unwell Liver injury--right upper belly pain, loss of appetite, nausea, light-colored stool, dark yellow or brown urine, yellowing skin or eyes, unusual weakness or fatigue Low blood pressure--dizziness, feeling faint or  lightheaded, blurry vision Lung injury--shortness of breath or trouble breathing, cough, spitting up blood, chest pain, fever Pain, tingling, or numbness in the hands or feet Severe or prolonged diarrhea Stomach pain, bloody diarrhea, pale skin, unusual weakness or fatigue, decrease in the amount of urine, which may be signs of hemolytic uremic syndrome Sudden and severe headache, confusion, change in vision, seizures, which may be signs of posterior reversible encephalopathy syndrome (PRES) TTP--purple spots on the skin or inside the mouth, pale skin, yellowing skin or eyes, unusual weakness or fatigue, fever, fast or irregular heartbeat, confusion, change in vision, trouble speaking, trouble walking Tumor lysis syndrome (TLS)--nausea, vomiting, diarrhea, decrease in the amount of urine, dark urine, unusual weakness or fatigue, confusion, muscle pain or cramps, fast or irregular heartbeat, joint pain Side effects that usually do not require medical attention (report to your care team if they continue or are bothersome): Constipation Diarrhea Fatigue Loss of appetite Nausea This list may not describe all possible side effects. Call your doctor for medical advice about side effects. You may report side effects to FDA at 1-800-FDA-1088. Where should I keep my medication? This medication is given in a hospital or clinic. It will not be stored at home. NOTE: This sheet is a summary. It may not cover all possible information.   If you have questions about this medicine, talk to your doctor, pharmacist, or health care provider.  2023 Elsevier/Gold Standard (2022-05-13 00:00:00)       To help prevent nausea and vomiting after your treatment, we encourage you to take your nausea medication as directed.  BELOW ARE SYMPTOMS THAT SHOULD BE REPORTED IMMEDIATELY: *FEVER GREATER THAN 100.4 F (38 C) OR HIGHER *CHILLS OR SWEATING *NAUSEA AND VOMITING THAT IS NOT CONTROLLED WITH YOUR NAUSEA  MEDICATION *UNUSUAL SHORTNESS OF BREATH *UNUSUAL BRUISING OR BLEEDING *URINARY PROBLEMS (pain or burning when urinating, or frequent urination) *BOWEL PROBLEMS (unusual diarrhea, constipation, pain near the anus) TENDERNESS IN MOUTH AND THROAT WITH OR WITHOUT PRESENCE OF ULCERS (sore throat, sores in mouth, or a toothache) UNUSUAL RASH, SWELLING OR PAIN  UNUSUAL VAGINAL DISCHARGE OR ITCHING   Items with * indicate a potential emergency and should be followed up as soon as possible or go to the Emergency Department if any problems should occur.  Please show the CHEMOTHERAPY ALERT CARD or IMMUNOTHERAPY ALERT CARD at check-in to the Emergency Department and triage nurse.  Should you have questions after your visit or need to cancel or reschedule your appointment, please contact [facility] [phone]  and follow the prompts.  Office hours are 8:00 a.m. to 4:30 p.m. Monday - Friday. Please note that voicemails left after 4:00 p.m. may not be returned until the following business day.  We are closed weekends and major holidays. You have access to a nurse at all times for urgent questions. Please call the main number to the clinic [phone] and follow the prompts.  For any non-urgent questions, you may also contact your provider using MyChart. We now offer e-Visits for anyone 18 and older to request care online for non-urgent symptoms. For details visit [email or web address].   Also download the MyChart app! Go to the app store, search "MyChart", open the app, select [facility], and log in with your MyChart username and password.   

## 2023-04-20 NOTE — Progress Notes (Signed)
Velcade , B12, Retacrit given today per MD orders. Tolerated infusion without adverse affects. Vital signs stable. No complaints at this time. Discharged from clinic ambulatory in stable condition. Alert and oriented x 3. F/U with [facility] as scheduled.

## 2023-04-20 NOTE — Progress Notes (Signed)
Patient has been examined by Dr. Katragadda. Vital signs and labs have been reviewed by MD - ANC, Creatinine, LFTs, hemoglobin, and platelets are within treatment parameters per M.D. - pt may proceed with treatment.  Primary RN and pharmacy notified.  

## 2023-04-20 NOTE — Patient Instructions (Signed)
[facility] Discharge Instructions   You were seen and examined today by Dr. Katragadda.  He reviewed the results of your lab work which are normal/stable.   We will proceed with your treatment today.  Return as scheduled.    Thank you for choosing [facility] at [facility] to provide your oncology and hematology care.  To afford each patient quality time with our provider, please arrive at least 15 minutes before your scheduled appointment time.   If you have a lab appointment with the Cancer Center please come in thru the Main Entrance and check in at the main information desk.  You need to re-schedule your appointment should you arrive 10 or more minutes late.  We strive to give you quality time with our providers, and arriving late affects you and other patients whose appointments are after yours.  Also, if you no show three or more times for appointments you may be dismissed from the clinic at the providers discretion.     Again, thank you for choosing [facility].  Our hope is that these requests will decrease the amount of time that you wait before being seen by our physicians.       _____________________________________________________________  Should you have questions after your visit to [facility], please contact our office at [phone] and follow the prompts.  Our office hours are 8:00 a.m. and 4:30 p.m. Monday - Friday.  Please note that voicemails left after 4:00 p.m. may not be returned until the following business day.  We are closed weekends and major holidays.  You do have access to a nurse 24-7, just call the main number to the clinic [phone] and do not press any options, hold on the line and a nurse will answer the phone.    For prescription refill requests, have your pharmacy contact our office and allow 72 hours.    Due to Covid, you will need to wear a mask upon entering  the hospital. If you do not have a mask, a mask will be given to you at the Main Entrance upon arrival. For doctor visits, patients may have 1 support person age 18 or older with them. For treatment visits, patients can not have anyone with them due to social distancing guidelines and our immunocompromised population.      

## 2023-04-20 NOTE — Progress Notes (Signed)
Labs reviewed with MD today at office visit. Ok to treat per MD.  

## 2023-04-21 ENCOUNTER — Other Ambulatory Visit: Payer: Self-pay

## 2023-05-01 ENCOUNTER — Other Ambulatory Visit: Payer: Self-pay

## 2023-05-04 ENCOUNTER — Ambulatory Visit: Payer: Medicare Other | Admitting: Hematology

## 2023-05-04 ENCOUNTER — Inpatient Hospital Stay: Payer: Medicare Other

## 2023-05-04 ENCOUNTER — Inpatient Hospital Stay: Payer: Medicare Other | Attending: Hematology

## 2023-05-04 VITALS — BP 172/80 | HR 70 | Temp 96.9°F | Resp 20 | Wt 170.2 lb

## 2023-05-04 DIAGNOSIS — E538 Deficiency of other specified B group vitamins: Secondary | ICD-10-CM | POA: Diagnosis not present

## 2023-05-04 DIAGNOSIS — C9000 Multiple myeloma not having achieved remission: Secondary | ICD-10-CM | POA: Diagnosis present

## 2023-05-04 DIAGNOSIS — R739 Hyperglycemia, unspecified: Secondary | ICD-10-CM | POA: Insufficient documentation

## 2023-05-04 DIAGNOSIS — D539 Nutritional anemia, unspecified: Secondary | ICD-10-CM | POA: Diagnosis not present

## 2023-05-04 DIAGNOSIS — Z5112 Encounter for antineoplastic immunotherapy: Secondary | ICD-10-CM | POA: Diagnosis present

## 2023-05-04 LAB — CBC WITH DIFFERENTIAL/PLATELET
Abs Immature Granulocytes: 0.02 10*3/uL (ref 0.00–0.07)
Basophils Absolute: 0 10*3/uL (ref 0.0–0.1)
Basophils Relative: 1 %
Eosinophils Absolute: 0 10*3/uL (ref 0.0–0.5)
Eosinophils Relative: 1 %
HCT: 33.9 % — ABNORMAL LOW (ref 36.0–46.0)
Hemoglobin: 10.9 g/dL — ABNORMAL LOW (ref 12.0–15.0)
Immature Granulocytes: 0 %
Lymphocytes Relative: 23 %
Lymphs Abs: 1.2 10*3/uL (ref 0.7–4.0)
MCH: 33.3 pg (ref 26.0–34.0)
MCHC: 32.2 g/dL (ref 30.0–36.0)
MCV: 103.7 fL — ABNORMAL HIGH (ref 80.0–100.0)
Monocytes Absolute: 0.5 10*3/uL (ref 0.1–1.0)
Monocytes Relative: 9 %
Neutro Abs: 3.4 10*3/uL (ref 1.7–7.7)
Neutrophils Relative %: 66 %
Platelets: 219 10*3/uL (ref 150–400)
RBC: 3.27 MIL/uL — ABNORMAL LOW (ref 3.87–5.11)
RDW: 13.9 % (ref 11.5–15.5)
WBC: 5.2 10*3/uL (ref 4.0–10.5)
nRBC: 0 % (ref 0.0–0.2)

## 2023-05-04 LAB — COMPREHENSIVE METABOLIC PANEL
ALT: 10 U/L (ref 0–44)
AST: 13 U/L — ABNORMAL LOW (ref 15–41)
Albumin: 3.4 g/dL — ABNORMAL LOW (ref 3.5–5.0)
Alkaline Phosphatase: 122 U/L (ref 38–126)
Anion gap: 9 (ref 5–15)
BUN: 20 mg/dL (ref 8–23)
CO2: 25 mmol/L (ref 22–32)
Calcium: 9.4 mg/dL (ref 8.9–10.3)
Chloride: 103 mmol/L (ref 98–111)
Creatinine, Ser: 1.04 mg/dL — ABNORMAL HIGH (ref 0.44–1.00)
GFR, Estimated: 53 mL/min — ABNORMAL LOW (ref >60)
Glucose, Bld: 229 mg/dL — ABNORMAL HIGH (ref 70–99)
Potassium: 3.7 mmol/L (ref 3.5–5.1)
Sodium: 137 mmol/L (ref 135–145)
Total Bilirubin: 0.6 mg/dL (ref 0.3–1.2)
Total Protein: 7 g/dL (ref 6.5–8.1)

## 2023-05-04 LAB — MAGNESIUM: Magnesium: 1.9 mg/dL (ref 1.7–2.4)

## 2023-05-04 MED ORDER — BORTEZOMIB CHEMO SQ INJECTION 3.5 MG (2.5MG/ML)
1.3000 mg/m2 | Freq: Once | INTRAMUSCULAR | Status: AC
  Administered 2023-05-04: 2.25 mg via SUBCUTANEOUS
  Filled 2023-05-04: qty 0.9

## 2023-05-04 MED ORDER — PROCHLORPERAZINE MALEATE 10 MG PO TABS
10.0000 mg | ORAL_TABLET | Freq: Once | ORAL | Status: AC
  Administered 2023-05-04: 10 mg via ORAL
  Filled 2023-05-04: qty 1

## 2023-05-04 MED ORDER — DEXAMETHASONE 4 MG PO TABS
20.0000 mg | ORAL_TABLET | Freq: Once | ORAL | Status: AC
  Administered 2023-05-04: 20 mg via ORAL
  Filled 2023-05-04: qty 5

## 2023-05-04 NOTE — Progress Notes (Signed)
Patient presents today for chemotherapy injection, Velcade.  Patient is in satisfactory condition with no new complaints voiced.  Vital signs are stable.  Labs reviewed and all labs are within treatment parameters. Patient's Hgb 10.9, patient does not require Retacrit injection today.   We will proceed with treatment per MD orders.    Patient tolerated treatment well with no complaints voiced.  Patient left ambulatory in stable condition.  Vital signs stable at discharge.  Follow up as scheduled.

## 2023-05-04 NOTE — Patient Instructions (Signed)
[facility]  Discharge Instructions: Thank you for choosing [facility] to provide your oncology and hematology care.  If you have a lab appointment with the Cancer Center - please note that after April 8th, 2024, all labs will be drawn in the cancer center.  You do not have to check in or register with the main entrance as you have in the past but will complete your check-in in the cancer center.  Wear comfortable clothing and clothing appropriate for easy access to any Portacath or PICC line.   We strive to give you quality time with your provider. You may need to reschedule your appointment if you arrive late (15 or more minutes).  Arriving late affects you and other patients whose appointments are after yours.  Also, if you miss three or more appointments without notifying the office, you may be dismissed from the clinic at the provider's discretion.      For prescription refill requests, have your pharmacy contact our office and allow 72 hours for refills to be completed.    Today you received the following chemotherapy and/or immunotherapy agents Velcade. Bortezomib Injection What is this medication? BORTEZOMIB (bor TEZ oh mib) treats lymphoma. It may also be used to treat multiple myeloma, a type of bone marrow cancer. It works by blocking a protein that causes cancer cells to grow and multiply. This helps to slow or stop the spread of cancer cells. This medicine may be used for other purposes; ask your health care provider or pharmacist if you have questions. COMMON BRAND NAME(S): Velcade What should I tell my care team before I take this medication? They need to know if you have any of these conditions: Dehydration Diabetes Heart disease Liver disease Tingling of the fingers or toes or other nerve disorder An unusual or allergic reaction to bortezomib, other medications, foods, dyes, or preservatives If you or your partner are pregnant or trying to  get pregnant Breastfeeding How should I use this medication? This medication is injected into a vein or under the skin. It is given by your care team in a hospital or clinic setting. Talk to your care team about the use of this medication in children. Special care may be needed. Overdosage: If you think you have taken too much of this medicine contact a poison control center or emergency room at once. NOTE: This medicine is only for you. Do not share this medicine with others. What if I miss a dose? Keep appointments for follow-up doses. It is important not to miss your dose. Call your care team if you are unable to keep an appointment. What may interact with this medication? Ketoconazole Rifampin This list may not describe all possible interactions. Give your health care provider a list of all the medicines, herbs, non-prescription drugs, or dietary supplements you use. Also tell them if you smoke, drink alcohol, or use illegal drugs. Some items may interact with your medicine. What should I watch for while using this medication? Your condition will be monitored carefully while you are receiving this medication. You may need blood work while taking this medication. This medication may affect your coordination, reaction time, or judgment. Do not drive or operate machinery until you know how this medication affects you. Sit up or stand slowly to reduce the risk of dizzy or fainting spells. Drinking alcohol with this medication can increase the risk of these side effects. This medication may increase your risk of getting an infection. Call   your care team for advice if you get a fever, chills, sore throat, or other symptoms of a cold or flu. Do not treat yourself. Try to avoid being around people who are sick. Check with your care team if you have severe diarrhea, nausea, and vomiting, or if you sweat a lot. The loss of too much body fluid may make it dangerous for you to take this medication. Talk to  your care team if you may be pregnant. Serious birth defects can occur if you take this medication during pregnancy and for 7 months after the last dose. You will need a negative pregnancy test before starting this medication. Contraception is recommended while taking this medication and for 7 months after the last dose. Your care team can help you find the option that works for you. If your partner can get pregnant, use a condom during sex while taking this medication and for 4 months after the last dose. Do not breastfeed while taking this medication and for 2 months after the last dose. This medication may cause infertility. Talk to your care team if you are concerned about your fertility. What side effects may I notice from receiving this medication? Side effects that you should report to your care team as soon as possible: Allergic reactions--skin rash, itching, hives, swelling of the face, lips, tongue, or throat Bleeding--bloody or black, tar-like stools, vomiting blood or brown material that looks like coffee grounds, red or dark brown urine, small red or purple spots on skin, unusual bruising or bleeding Bleeding in the brain--severe headache, stiff neck, confusion, dizziness, change in vision, numbness or weakness of the face, arm, or leg, trouble speaking, trouble walking, vomiting Bowel blockage--stomach cramping, unable to have a bowel movement or pass gas, loss of appetite, vomiting Heart failure--shortness of breath, swelling of the ankles, feet, or hands, sudden weight gain, unusual weakness or fatigue Infection--fever, chills, cough, sore throat, wounds that don't heal, pain or trouble when passing urine, general feeling of discomfort or being unwell Liver injury--right upper belly pain, loss of appetite, nausea, light-colored stool, dark yellow or brown urine, yellowing skin or eyes, unusual weakness or fatigue Low blood pressure--dizziness, feeling faint or lightheaded, blurry  vision Lung injury--shortness of breath or trouble breathing, cough, spitting up blood, chest pain, fever Pain, tingling, or numbness in the hands or feet Severe or prolonged diarrhea Stomach pain, bloody diarrhea, pale skin, unusual weakness or fatigue, decrease in the amount of urine, which may be signs of hemolytic uremic syndrome Sudden and severe headache, confusion, change in vision, seizures, which may be signs of posterior reversible encephalopathy syndrome (PRES) TTP--purple spots on the skin or inside the mouth, pale skin, yellowing skin or eyes, unusual weakness or fatigue, fever, fast or irregular heartbeat, confusion, change in vision, trouble speaking, trouble walking Tumor lysis syndrome (TLS)--nausea, vomiting, diarrhea, decrease in the amount of urine, dark urine, unusual weakness or fatigue, confusion, muscle pain or cramps, fast or irregular heartbeat, joint pain Side effects that usually do not require medical attention (report to your care team if they continue or are bothersome): Constipation Diarrhea Fatigue Loss of appetite Nausea This list may not describe all possible side effects. Call your doctor for medical advice about side effects. You may report side effects to FDA at 1-800-FDA-1088. Where should I keep my medication? This medication is given in a hospital or clinic. It will not be stored at home. NOTE: This sheet is a summary. It may not cover all possible information. If   you have questions about this medicine, talk to your doctor, pharmacist, or health care provider.  2023 Elsevier/Gold Standard (2022-05-13 00:00:00)       To help prevent nausea and vomiting after your treatment, we encourage you to take your nausea medication as directed.  BELOW ARE SYMPTOMS THAT SHOULD BE REPORTED IMMEDIATELY: *FEVER GREATER THAN 100.4 F (38 C) OR HIGHER *CHILLS OR SWEATING *NAUSEA AND VOMITING THAT IS NOT CONTROLLED WITH YOUR NAUSEA MEDICATION *UNUSUAL SHORTNESS OF  BREATH *UNUSUAL BRUISING OR BLEEDING *URINARY PROBLEMS (pain or burning when urinating, or frequent urination) *BOWEL PROBLEMS (unusual diarrhea, constipation, pain near the anus) TENDERNESS IN MOUTH AND THROAT WITH OR WITHOUT PRESENCE OF ULCERS (sore throat, sores in mouth, or a toothache) UNUSUAL RASH, SWELLING OR PAIN  UNUSUAL VAGINAL DISCHARGE OR ITCHING   Items with * indicate a potential emergency and should be followed up as soon as possible or go to the Emergency Department if any problems should occur.  Please show the CHEMOTHERAPY ALERT CARD or IMMUNOTHERAPY ALERT CARD at check-in to the Emergency Department and triage nurse.  Should you have questions after your visit or need to cancel or reschedule your appointment, please contact [facility] [phone]  and follow the prompts.  Office hours are 8:00 a.m. to 4:30 p.m. Monday - Friday. Please note that voicemails left after 4:00 p.m. may not be returned until the following business day.  We are closed weekends and major holidays. You have access to a nurse at all times for urgent questions. Please call the main number to the clinic [phone] and follow the prompts.  For any non-urgent questions, you may also contact your provider using MyChart. We now offer e-Visits for anyone 18 and older to request care online for non-urgent symptoms. For details visit [email or web address].   Also download the MyChart app! Go to the app store, search "MyChart", open the app, select [facility], and log in with your MyChart username and password.   

## 2023-05-15 ENCOUNTER — Other Ambulatory Visit: Payer: Self-pay

## 2023-05-19 ENCOUNTER — Other Ambulatory Visit: Payer: Self-pay

## 2023-05-20 ENCOUNTER — Ambulatory Visit: Payer: Medicare Other | Admitting: Hematology

## 2023-05-20 ENCOUNTER — Inpatient Hospital Stay: Payer: Medicare Other

## 2023-05-20 ENCOUNTER — Other Ambulatory Visit: Payer: Medicare Other

## 2023-05-20 ENCOUNTER — Ambulatory Visit: Payer: Medicare Other

## 2023-05-20 VITALS — BP 174/93 | HR 79 | Temp 97.5°F | Wt 172.6 lb

## 2023-05-20 DIAGNOSIS — C9000 Multiple myeloma not having achieved remission: Secondary | ICD-10-CM

## 2023-05-20 DIAGNOSIS — R739 Hyperglycemia, unspecified: Secondary | ICD-10-CM

## 2023-05-20 DIAGNOSIS — Z5112 Encounter for antineoplastic immunotherapy: Secondary | ICD-10-CM | POA: Diagnosis not present

## 2023-05-20 DIAGNOSIS — E538 Deficiency of other specified B group vitamins: Secondary | ICD-10-CM

## 2023-05-20 LAB — CBC WITH DIFFERENTIAL/PLATELET
Abs Immature Granulocytes: 0.01 10*3/uL (ref 0.00–0.07)
Basophils Absolute: 0 10*3/uL (ref 0.0–0.1)
Basophils Relative: 0 %
Eosinophils Absolute: 0.1 10*3/uL (ref 0.0–0.5)
Eosinophils Relative: 1 %
HCT: 33.5 % — ABNORMAL LOW (ref 36.0–46.0)
Hemoglobin: 10.9 g/dL — ABNORMAL LOW (ref 12.0–15.0)
Immature Granulocytes: 0 %
Lymphocytes Relative: 25 %
Lymphs Abs: 1.4 10*3/uL (ref 0.7–4.0)
MCH: 33.7 pg (ref 26.0–34.0)
MCHC: 32.5 g/dL (ref 30.0–36.0)
MCV: 103.7 fL — ABNORMAL HIGH (ref 80.0–100.0)
Monocytes Absolute: 0.5 10*3/uL (ref 0.1–1.0)
Monocytes Relative: 8 %
Neutro Abs: 3.5 10*3/uL (ref 1.7–7.7)
Neutrophils Relative %: 66 %
Platelets: 195 10*3/uL (ref 150–400)
RBC: 3.23 MIL/uL — ABNORMAL LOW (ref 3.87–5.11)
RDW: 13.9 % (ref 11.5–15.5)
WBC: 5.4 10*3/uL (ref 4.0–10.5)
nRBC: 0 % (ref 0.0–0.2)

## 2023-05-20 LAB — COMPREHENSIVE METABOLIC PANEL
ALT: 11 U/L (ref 0–44)
AST: 15 U/L (ref 15–41)
Albumin: 3.7 g/dL (ref 3.5–5.0)
Alkaline Phosphatase: 120 U/L (ref 38–126)
Anion gap: 8 (ref 5–15)
BUN: 16 mg/dL (ref 8–23)
CO2: 27 mmol/L (ref 22–32)
Calcium: 9.5 mg/dL (ref 8.9–10.3)
Chloride: 97 mmol/L — ABNORMAL LOW (ref 98–111)
Creatinine, Ser: 1.05 mg/dL — ABNORMAL HIGH (ref 0.44–1.00)
GFR, Estimated: 53 mL/min — ABNORMAL LOW (ref >60)
Glucose, Bld: 399 mg/dL — ABNORMAL HIGH (ref 70–99)
Potassium: 4.2 mmol/L (ref 3.5–5.1)
Sodium: 132 mmol/L — ABNORMAL LOW (ref 135–145)
Total Bilirubin: 0.8 mg/dL (ref 0.3–1.2)
Total Protein: 7.3 g/dL (ref 6.5–8.1)

## 2023-05-20 LAB — FERRITIN: Ferritin: 267 ng/mL (ref 11–307)

## 2023-05-20 LAB — MAGNESIUM: Magnesium: 2 mg/dL (ref 1.7–2.4)

## 2023-05-20 LAB — IRON AND TIBC
Iron: 45 ug/dL (ref 28–170)
Saturation Ratios: 33 % — ABNORMAL HIGH (ref 10.4–31.8)
TIBC: 137 ug/dL — ABNORMAL LOW (ref 250–450)
UIBC: 92 ug/dL

## 2023-05-20 LAB — LACTATE DEHYDROGENASE: LDH: 137 U/L (ref 98–192)

## 2023-05-20 MED ORDER — PROCHLORPERAZINE MALEATE 10 MG PO TABS
10.0000 mg | ORAL_TABLET | Freq: Once | ORAL | Status: AC
  Administered 2023-05-20: 10 mg via ORAL
  Filled 2023-05-20: qty 1

## 2023-05-20 MED ORDER — BORTEZOMIB CHEMO SQ INJECTION 3.5 MG (2.5MG/ML)
1.3000 mg/m2 | Freq: Once | INTRAMUSCULAR | Status: AC
  Administered 2023-05-20: 2.25 mg via SUBCUTANEOUS
  Filled 2023-05-20: qty 0.9

## 2023-05-20 MED ORDER — INSULIN ASPART 100 UNIT/ML IJ SOLN
12.0000 [IU] | Freq: Once | INTRAMUSCULAR | Status: AC
  Administered 2023-05-20: 12 [IU] via SUBCUTANEOUS
  Filled 2023-05-20: qty 0.12

## 2023-05-20 MED ORDER — CYANOCOBALAMIN 1000 MCG/ML IJ SOLN
1000.0000 ug | Freq: Once | INTRAMUSCULAR | Status: AC
  Administered 2023-05-20: 1000 ug via INTRAMUSCULAR
  Filled 2023-05-20: qty 1

## 2023-05-20 MED ORDER — DEXAMETHASONE 4 MG PO TABS
20.0000 mg | ORAL_TABLET | Freq: Once | ORAL | Status: AC
  Administered 2023-05-20: 20 mg via ORAL
  Filled 2023-05-20: qty 5

## 2023-05-20 NOTE — Patient Instructions (Signed)
[facility]  Discharge Instructions: Thank you for choosing [facility] to provide your oncology and hematology care.  If you have a lab appointment with the Cancer Center - please note that after April 8th, 2024, all labs will be drawn in the cancer center.  You do not have to check in or register with the main entrance as you have in the past but will complete your check-in in the cancer center.  Wear comfortable clothing and clothing appropriate for easy access to any Portacath or PICC line.   We strive to give you quality time with your provider. You may need to reschedule your appointment if you arrive late (15 or more minutes).  Arriving late affects you and other patients whose appointments are after yours.  Also, if you miss three or more appointments without notifying the office, you may be dismissed from the clinic at the provider's discretion.      For prescription refill requests, have your pharmacy contact our office and allow 72 hours for refills to be completed.    Today you received the following chemotherapy and/or immunotherapy agents: bortezomib      To help prevent nausea and vomiting after your treatment, we encourage you to take your nausea medication as directed.  BELOW ARE SYMPTOMS THAT SHOULD BE REPORTED IMMEDIATELY: *FEVER GREATER THAN 100.4 F (38 C) OR HIGHER *CHILLS OR SWEATING *NAUSEA AND VOMITING THAT IS NOT CONTROLLED WITH YOUR NAUSEA MEDICATION *UNUSUAL SHORTNESS OF BREATH *UNUSUAL BRUISING OR BLEEDING *URINARY PROBLEMS (pain or burning when urinating, or frequent urination) *BOWEL PROBLEMS (unusual diarrhea, constipation, pain near the anus) TENDERNESS IN MOUTH AND THROAT WITH OR WITHOUT PRESENCE OF ULCERS (sore throat, sores in mouth, or a toothache) UNUSUAL RASH, SWELLING OR PAIN  UNUSUAL VAGINAL DISCHARGE OR ITCHING   Items with * indicate a potential emergency and should be followed up as soon as possible or go to the  Emergency Department if any problems should occur.  Please show the CHEMOTHERAPY ALERT CARD or IMMUNOTHERAPY ALERT CARD at check-in to the Emergency Department and triage nurse.  Should you have questions after your visit or need to cancel or reschedule your appointment, please contact [facility] [phone]  and follow the prompts.  Office hours are 8:00 a.m. to 4:30 p.m. Monday - Friday. Please note that voicemails left after 4:00 p.m. may not be returned until the following business day.  We are closed weekends and major holidays. You have access to a nurse at all times for urgent questions. Please call the main number to the clinic [phone] and follow the prompts.  For any non-urgent questions, you may also contact your provider using MyChart. We now offer e-Visits for anyone 18 and older to request care online for non-urgent symptoms. For details visit [email or web address].   Also download the MyChart app! Go to the app store, search "MyChart", open the app, select [facility], and log in with your MyChart username and password.   

## 2023-05-20 NOTE — Progress Notes (Signed)
Blood glucose 399  Received order for Novolog 12 units Subq x 1  T.O. Dr Carilyn Goodpasture, PharmD

## 2023-05-20 NOTE — Progress Notes (Signed)
Patient and family member given instructions to check [facility] when she gets home and if 400 or greater to go to ER. Patient and family verbalized understanding.

## 2023-05-25 LAB — KAPPA/LAMBDA LIGHT CHAINS
Kappa free light chain: 102.2 mg/L — ABNORMAL HIGH (ref 3.3–19.4)
Kappa, lambda light chain ratio: 5.14 — ABNORMAL HIGH (ref 0.26–1.65)
Lambda free light chains: 19.9 mg/L (ref 5.7–26.3)

## 2023-05-26 LAB — PROTEIN ELECTROPHORESIS, SERUM
A/G Ratio: 1 (ref 0.7–1.7)
Albumin ELP: 3.4 g/dL (ref 2.9–4.4)
Alpha-1-Globulin: 0.2 g/dL (ref 0.0–0.4)
Alpha-2-Globulin: 0.8 g/dL (ref 0.4–1.0)
Beta Globulin: 1.5 g/dL — ABNORMAL HIGH (ref 0.7–1.3)
Gamma Globulin: 1 g/dL (ref 0.4–1.8)
Globulin, Total: 3.4 g/dL (ref 2.2–3.9)
M-Spike, %: 0.6 g/dL — ABNORMAL HIGH
Total Protein ELP: 6.8 g/dL (ref 6.0–8.5)

## 2023-06-02 ENCOUNTER — Other Ambulatory Visit: Payer: Medicare Other

## 2023-06-02 ENCOUNTER — Inpatient Hospital Stay: Payer: Medicare Other | Attending: Hematology

## 2023-06-02 ENCOUNTER — Inpatient Hospital Stay: Payer: Medicare Other

## 2023-06-02 ENCOUNTER — Ambulatory Visit: Payer: Medicare Other | Admitting: Hematology

## 2023-06-02 VITALS — BP 171/69 | HR 63 | Resp 16

## 2023-06-02 DIAGNOSIS — C9000 Multiple myeloma not having achieved remission: Secondary | ICD-10-CM

## 2023-06-02 DIAGNOSIS — Z5112 Encounter for antineoplastic immunotherapy: Secondary | ICD-10-CM | POA: Diagnosis present

## 2023-06-02 LAB — COMPREHENSIVE METABOLIC PANEL
ALT: 11 U/L (ref 0–44)
AST: 15 U/L (ref 15–41)
Albumin: 3.5 g/dL (ref 3.5–5.0)
Alkaline Phosphatase: 102 U/L (ref 38–126)
Anion gap: 9 (ref 5–15)
BUN: 19 mg/dL (ref 8–23)
CO2: 24 mmol/L (ref 22–32)
Calcium: 9.4 mg/dL (ref 8.9–10.3)
Chloride: 106 mmol/L (ref 98–111)
Creatinine, Ser: 1.06 mg/dL — ABNORMAL HIGH (ref 0.44–1.00)
GFR, Estimated: 52 mL/min — ABNORMAL LOW (ref >60)
Glucose, Bld: 181 mg/dL — ABNORMAL HIGH (ref 70–99)
Potassium: 3.5 mmol/L (ref 3.5–5.1)
Sodium: 139 mmol/L (ref 135–145)
Total Bilirubin: 0.8 mg/dL (ref 0.3–1.2)
Total Protein: 7 g/dL (ref 6.5–8.1)

## 2023-06-02 LAB — MAGNESIUM: Magnesium: 1.6 mg/dL — ABNORMAL LOW (ref 1.7–2.4)

## 2023-06-02 LAB — CBC WITH DIFFERENTIAL/PLATELET
Abs Immature Granulocytes: 0.01 10*3/uL (ref 0.00–0.07)
Basophils Absolute: 0 10*3/uL (ref 0.0–0.1)
Basophils Relative: 1 %
Eosinophils Absolute: 0 10*3/uL (ref 0.0–0.5)
Eosinophils Relative: 1 %
HCT: 33.2 % — ABNORMAL LOW (ref 36.0–46.0)
Hemoglobin: 10.9 g/dL — ABNORMAL LOW (ref 12.0–15.0)
Immature Granulocytes: 0 %
Lymphocytes Relative: 26 %
Lymphs Abs: 1.4 10*3/uL (ref 0.7–4.0)
MCH: 33.6 pg (ref 26.0–34.0)
MCHC: 32.8 g/dL (ref 30.0–36.0)
MCV: 102.5 fL — ABNORMAL HIGH (ref 80.0–100.0)
Monocytes Absolute: 0.4 10*3/uL (ref 0.1–1.0)
Monocytes Relative: 7 %
Neutro Abs: 3.6 10*3/uL (ref 1.7–7.7)
Neutrophils Relative %: 65 %
Platelets: 205 10*3/uL (ref 150–400)
RBC: 3.24 MIL/uL — ABNORMAL LOW (ref 3.87–5.11)
RDW: 14.2 % (ref 11.5–15.5)
WBC: 5.4 10*3/uL (ref 4.0–10.5)
nRBC: 0 % (ref 0.0–0.2)

## 2023-06-02 MED ORDER — BORTEZOMIB CHEMO SQ INJECTION 3.5 MG (2.5MG/ML)
1.3000 mg/m2 | Freq: Once | INTRAMUSCULAR | Status: AC
  Administered 2023-06-02: 2.25 mg via SUBCUTANEOUS
  Filled 2023-06-02: qty 0.9

## 2023-06-02 MED ORDER — PROCHLORPERAZINE MALEATE 10 MG PO TABS
10.0000 mg | ORAL_TABLET | Freq: Once | ORAL | Status: AC
  Administered 2023-06-02: 10 mg via ORAL
  Filled 2023-06-02: qty 1

## 2023-06-02 MED ORDER — DEXAMETHASONE 4 MG PO TABS
20.0000 mg | ORAL_TABLET | Freq: Once | ORAL | Status: AC
  Administered 2023-06-02: 20 mg via ORAL
  Filled 2023-06-02: qty 5

## 2023-06-02 NOTE — Progress Notes (Signed)
Patient presents today for Velcade infusion. Patient is in satisfactory condition with no new complaints voiced.  Vital signs are stable. Pt's hemoglobin noted to be 10.9 today per parameters, pt does not need Retacrit injection today. Labs reviewed and all labs are within treatment parameters.   We will proceed with treatment per MD orders.    Treatment given today per MD orders. Tolerated infusion without adverse affects. Vital signs stable. No complaints at this time. Discharged from clinic ambulatory in stable condition. Alert and oriented x 3. F/U with [facility] as scheduled.

## 2023-06-02 NOTE — Patient Instructions (Signed)
[facility]  Discharge Instructions: Thank you for choosing [facility] to provide your oncology and hematology care.  If you have a lab appointment with the Cancer Center - please note that after April 8th, 2024, all labs will be drawn in the cancer center.  You do not have to check in or register with the main entrance as you have in the past but will complete your check-in in the cancer center.  Wear comfortable clothing and clothing appropriate for easy access to any Portacath or PICC line.   We strive to give you quality time with your provider. You may need to reschedule your appointment if you arrive late (15 or more minutes).  Arriving late affects you and other patients whose appointments are after yours.  Also, if you miss three or more appointments without notifying the office, you may be dismissed from the clinic at the provider's discretion.      For prescription refill requests, have your pharmacy contact our office and allow 72 hours for refills to be completed.    Today you received the following chemotherapy and/or immunotherapy agents Velcade   To help prevent nausea and vomiting after your treatment, we encourage you to take your nausea medication as directed.  Bortezomib Injection What is this medication? BORTEZOMIB (bor TEZ oh mib) treats lymphoma. It may also be used to treat multiple myeloma, a type of bone marrow cancer. It works by blocking a protein that causes cancer cells to grow and multiply. This helps to slow or stop the spread of cancer cells. This medicine may be used for other purposes; ask your health care provider or pharmacist if you have questions. COMMON BRAND NAME(S): Velcade What should I tell my care team before I take this medication? They need to know if you have any of these conditions: Dehydration Diabetes Heart disease Liver disease Tingling of the fingers or toes or other nerve disorder An unusual or  allergic reaction to bortezomib, other medications, foods, dyes, or preservatives If you or your partner are pregnant or trying to get pregnant Breastfeeding How should I use this medication? This medication is injected into a vein or under the skin. It is given by your care team in a hospital or clinic setting. Talk to your care team about the use of this medication in children. Special care may be needed. Overdosage: If you think you have taken too much of this medicine contact a poison control center or emergency room at once. NOTE: This medicine is only for you. Do not share this medicine with others. What if I miss a dose? Keep appointments for follow-up doses. It is important not to miss your dose. Call your care team if you are unable to keep an appointment. What may interact with this medication? Ketoconazole Rifampin This list may not describe all possible interactions. Give your health care provider a list of all the medicines, herbs, non-prescription drugs, or dietary supplements you use. Also tell them if you smoke, drink alcohol, or use illegal drugs. Some items may interact with your medicine. What should I watch for while using this medication? Your condition will be monitored carefully while you are receiving this medication. You may need blood work while taking this medication. This medication may affect your coordination, reaction time, or judgment. Do not drive or operate machinery until you know how this medication affects you. Sit up or stand slowly to reduce the risk of dizzy or fainting spells. Drinking alcohol  with this medication can increase the risk of these side effects. This medication may increase your risk of getting an infection. Call your care team for advice if you get a fever, chills, sore throat, or other symptoms of a cold or flu. Do not treat yourself. Try to avoid being around people who are sick. Check with your care team if you have severe diarrhea, nausea,  and vomiting, or if you sweat a lot. The loss of too much body fluid may make it dangerous for you to take this medication. Talk to your care team if you may be pregnant. Serious birth defects can occur if you take this medication during pregnancy and for 7 months after the last dose. You will need a negative pregnancy test before starting this medication. Contraception is recommended while taking this medication and for 7 months after the last dose. Your care team can help you find the option that works for you. If your partner can get pregnant, use a condom during sex while taking this medication and for 4 months after the last dose. Do not breastfeed while taking this medication and for 2 months after the last dose. This medication may cause infertility. Talk to your care team if you are concerned about your fertility. What side effects may I notice from receiving this medication? Side effects that you should report to your care team as soon as possible: Allergic reactions--skin rash, itching, hives, swelling of the face, lips, tongue, or throat Bleeding--bloody or black, tar-like stools, vomiting blood or brown material that looks like coffee grounds, red or dark brown urine, small red or purple spots on skin, unusual bruising or bleeding Bleeding in the brain--severe headache, stiff neck, confusion, dizziness, change in vision, numbness or weakness of the face, arm, or leg, trouble speaking, trouble walking, vomiting Bowel blockage--stomach cramping, unable to have a bowel movement or pass gas, loss of appetite, vomiting Heart failure--shortness of breath, swelling of the ankles, feet, or hands, sudden weight gain, unusual weakness or fatigue Infection--fever, chills, cough, sore throat, wounds that don't heal, pain or trouble when passing urine, general feeling of discomfort or being unwell Liver injury--right upper belly pain, loss of appetite, nausea, light-colored stool, dark yellow or brown  urine, yellowing skin or eyes, unusual weakness or fatigue Low blood pressure--dizziness, feeling faint or lightheaded, blurry vision Lung injury--shortness of breath or trouble breathing, cough, spitting up blood, chest pain, fever Pain, tingling, or numbness in the hands or feet Severe or prolonged diarrhea Stomach pain, bloody diarrhea, pale skin, unusual weakness or fatigue, decrease in the amount of urine, which may be signs of hemolytic uremic syndrome Sudden and severe headache, confusion, change in vision, seizures, which may be signs of posterior reversible encephalopathy syndrome (PRES) TTP--purple spots on the skin or inside the mouth, pale skin, yellowing skin or eyes, unusual weakness or fatigue, fever, fast or irregular heartbeat, confusion, change in vision, trouble speaking, trouble walking Tumor lysis syndrome (TLS)--nausea, vomiting, diarrhea, decrease in the amount of urine, dark urine, unusual weakness or fatigue, confusion, muscle pain or cramps, fast or irregular heartbeat, joint pain Side effects that usually do not require medical attention (report to your care team if they continue or are bothersome): Constipation Diarrhea Fatigue Loss of appetite Nausea This list may not describe all possible side effects. Call your doctor for medical advice about side effects. You may report side effects to FDA at 1-800-FDA-1088. Where should I keep my medication? This medication is given in a hospital or  clinic. It will not be stored at home. NOTE: This sheet is a summary. It may not cover all possible information. If you have questions about this medicine, talk to your doctor, pharmacist, or health care provider.  2024 Elsevier/Gold Standard (2022-05-19 00:00:00)   BELOW ARE SYMPTOMS THAT SHOULD BE REPORTED IMMEDIATELY: *FEVER GREATER THAN 100.4 F (38 C) OR HIGHER *CHILLS OR SWEATING *NAUSEA AND VOMITING THAT IS NOT CONTROLLED WITH YOUR NAUSEA MEDICATION *UNUSUAL SHORTNESS OF  BREATH *UNUSUAL BRUISING OR BLEEDING *URINARY PROBLEMS (pain or burning when urinating, or frequent urination) *BOWEL PROBLEMS (unusual diarrhea, constipation, pain near the anus) TENDERNESS IN MOUTH AND THROAT WITH OR WITHOUT PRESENCE OF ULCERS (sore throat, sores in mouth, or a toothache) UNUSUAL RASH, SWELLING OR PAIN  UNUSUAL VAGINAL DISCHARGE OR ITCHING   Items with * indicate a potential emergency and should be followed up as soon as possible or go to the Emergency Department if any problems should occur.  Please show the CHEMOTHERAPY ALERT CARD or IMMUNOTHERAPY ALERT CARD at check-in to the Emergency Department and triage nurse.  Should you have questions after your visit or need to cancel or reschedule your appointment, please contact [facility] [phone]  and follow the prompts.  Office hours are 8:00 a.m. to 4:30 p.m. Monday - Friday. Please note that voicemails left after 4:00 p.m. may not be returned until the following business day.  We are closed weekends and major holidays. You have access to a nurse at all times for urgent questions. Please call the main number to the clinic [phone] and follow the prompts.  For any non-urgent questions, you may also contact your provider using MyChart. We now offer e-Visits for anyone 18 and older to request care online for non-urgent symptoms. For details visit [email or web address].   Also download the MyChart app! Go to the app store, search "MyChart", open the app, select [place], and log in with your MyChart username and password.

## 2023-06-03 LAB — KAPPA/LAMBDA LIGHT CHAINS
Kappa free light chain: 107.9 mg/L — ABNORMAL HIGH (ref 3.3–19.4)
Kappa, lambda light chain ratio: 6.46 — ABNORMAL HIGH (ref 0.26–1.65)
Lambda free light chains: 16.7 mg/L (ref 5.7–26.3)

## 2023-06-07 LAB — PROTEIN ELECTROPHORESIS, SERUM
A/G Ratio: 1 (ref 0.7–1.7)
Albumin ELP: 3.3 g/dL (ref 2.9–4.4)
Alpha-1-Globulin: 0.2 g/dL (ref 0.0–0.4)
Alpha-2-Globulin: 0.7 g/dL (ref 0.4–1.0)
Beta Globulin: 1.4 g/dL — ABNORMAL HIGH (ref 0.7–1.3)
Gamma Globulin: 0.8 g/dL (ref 0.4–1.8)
Globulin, Total: 3.2 g/dL (ref 2.2–3.9)
Total Protein ELP: 6.5 g/dL (ref 6.0–8.5)

## 2023-06-15 NOTE — Progress Notes (Addendum)
[facility] [address] [place] [address]    Clinic Day:  06/16/2023  Referring physician: [name], MD  Patient Care Team: [name], MD as PCP - General (Internal Medicine) [name], MD as Medical Oncologist (Hematology)   ASSESSMENT & PLAN:   Assessment: 1.  IgA kappa plasma cell myeloma, stage I: -RVD started on 01/09/2018, held since 02/14/2020 due to mandible abscess. -Myeloma labs on 05/14/2020 showed progression with M spike of 0.5 g. -RVD started back on 05/21/2020. -Myeloma labs on 07/10/2020 shows M spike improved to 0.3 g from 0.6 g previously.  Free light chain ratio is 1.74 with kappa light chains 28.4. -Myeloma panel from 08/14/2020 shows M spike 0.4 g.  Kappa light chains are 24.8 and ratio is 2.23. - Revlimid (20 mg 2 weeks on/1 week off) and Velcade (3 weeks on/1 week off) were held after 02/26/2022 due to weight loss. - Velcade maintenance every 2 weeks started on 04/13/2022.   2.  Osteomyelitis of the right mandible/dental abscess: -Finished IV ceftriaxone on 04/03/2020.  Finished oral antibiotics. -We will hold Xgeva indefinitely.    Plan: 1.  IgA kappa plasma cell myeloma, stage I: - She is tolerating Velcade every other week along with dexamethasone 20 mg on the injection is very well. - Reviewed myeloma panel from 06/02/2023: M spike is not observed.  Free light chain ratio is 6.46 with kappa light chains 107.9.  Overall this is stable. - Reviewed labs from today which showed creatinine stable at 1.11 and LFTs are stable.  CBC grossly normal with stable mild normocytic anemia. - She has noticed bruising under the left eye and the right forearm.  She thinks grease fell on it while she was cooking on the right arm causing bruising.  She does not remember falling down or bumping into furniture/wall which caused the left eye bruising.  She lives at home with her brother.  Her niece is accompanying her today.  There was no clear  indication of any elder abuse.  Will closely monitor. - Will continue Velcade maintenance with dexamethasone.  RTC 8 weeks for follow-up with repeat myeloma panel.   2.  Severe hypokalemia: - Continue liquid potassium 3 times daily.  Potassium is normal today.   3.  Osteomyelitis of the right mandible/dental abscess: - Bisphosphonates held due to ONJ.   4.  Hypomagnesemia: - Continue magnesium 3 times daily.  Magnesium is 1.6 today.   5.  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  Hemoglobin today is 10.4.    Orders Placed This Encounter  Procedures   Magnesium    Standing Status:   Future    Standing Expiration Date:   08/09/2024   CBC with Differential    Standing Status:   Future    Standing Expiration Date:   08/10/2024   Comprehensive metabolic panel    Standing Status:   Future    Standing Expiration Date:   08/10/2024   Magnesium    Standing Status:   Future    Standing Expiration Date:   08/23/2024   CBC with Differential    Standing Status:   Future    Standing Expiration Date:   08/24/2024   Comprehensive metabolic panel    Standing Status:   Future    Standing Expiration Date:   08/24/2024   Magnesium    Standing Status:   Future    Standing Expiration Date:   09/06/2024   CBC with  Differential    Standing Status:   Future    Standing Expiration Date:   09/07/2024   Comprehensive metabolic panel    Standing Status:   Future    Standing Expiration Date:   09/07/2024   Magnesium    Standing Status:   Future    Standing Expiration Date:   09/20/2024   CBC with Differential    Standing Status:   Future    Standing Expiration Date:   09/21/2024   Comprehensive metabolic panel    Standing Status:   Future    Standing Expiration Date:   09/21/2024   Magnesium    Standing Status:   Future    Standing Expiration Date:   10/04/2024   CBC with Differential    Standing Status:   Future    Standing Expiration Date:   10/05/2024   Comprehensive  metabolic panel    Standing Status:   Future    Standing Expiration Date:   10/05/2024   Magnesium    Standing Status:   Future    Standing Expiration Date:   10/18/2024   CBC with Differential    Standing Status:   Future    Standing Expiration Date:   10/19/2024   Comprehensive metabolic panel    Standing Status:   Future    Standing Expiration Date:   10/19/2024      I,[name],acting as a scribe for [name], MD.,have documented all relevant documentation on the behalf of [name], MD,as directed by  [name], MD while in the presence of [name], MD.   I, [name] MD, have reviewed the above documentation for accuracy and completeness, and I agree with the above.   [name], MD   6/19/202412:32 PM  CHIEF COMPLAINT:   Diagnosis: multiple myeloma    Cancer Staging  No matching staging information was found for the patient.   Prior Therapy: RVD 01/09/18 through 02/14/20, 05/21/20 through 02/26/22   Current Therapy:  Maintenance Velcade every other week.    HISTORY OF PRESENT ILLNESS:   Oncology History  Multiple myeloma not having achieved remission (HCC)  01/20/2018 Initial Diagnosis   Multiple myeloma not having achieved remission (HCC)   01/26/2018 - 09/01/2022 Chemotherapy   Patient is on Treatment Plan : MYELOMA  RVD SQ (Bortezomib d 1,8,15 ) q28d x 4 cycles     04/13/2022 -  Chemotherapy   Patient is on Treatment Plan : MYELOMA MAINTENANCE Bortezomib SQ q14d        INTERVAL HISTORY:   Brittany Archer is a 82 y.o. female presenting to clinic today for follow up of multiple myeloma. She was last seen by me on 04/20/23.  Today, she states that she is doing well overall. Her appetite level is at 100%. Her energy level is at 75%.  PAST MEDICAL HISTORY:   Past Medical History: Past Medical History:  Diagnosis Date   Breast cancer (HCC)    left breast/ 2008/ surg/ rad tx   Coronary artery disease     Diabetes mellitus     Surgical History: Past Surgical History:  Procedure Laterality Date   ABDOMINAL HYSTERECTOMY     BREAST SURGERY     DEBRIDEMENT MANDIBLE N/A 02/22/2020   Procedure: INCISION AND DRAINAGE WITH DEBRIDEMENT MANDIBLE;  Surgeon: [name], DMD;  Location: [facility];  Service: Oral Surgery;  Laterality: N/A;   DEBRIDEMENT MANDIBLE Right 03/19/2021   Procedure: DEBRIDEMENT OF BONE RIGHT INTERIOR  MANDIBLE;  Surgeon: [name], DMD;  Location: [facility];  Service: Oral Surgery;  Laterality: Right;   EYE SURGERY  2021   cataract removals    TOOTH EXTRACTION N/A 02/22/2020   Procedure: DENTAL RESTORATION/EXTRACTIONS;  Surgeon: [name], DMD;  Location: [facility];  Service: Oral Surgery;  Laterality: N/A;  DENTAL KIT REQUESTED    Social History: Social History   Socioeconomic History   Marital status: Divorced    Spouse name: Not on file   Number of children: Not on file   Years of education: Not on file   Highest education level: Not on file  Occupational History   Not on file  Tobacco Use   Smoking status: Never   Smokeless tobacco: Never  Vaping Use   Vaping Use: Never used  Substance and Sexual Activity   Alcohol use: No   Drug use: No   Sexual activity: Yes    Birth control/protection: Surgical  Other Topics Concern   Not on file  Social History Narrative   Not on file   Social Determinants of Health   Financial Resource Strain: Low Risk  (12/09/2020)   Overall Financial Resource Strain (CARDIA)    Difficulty of Paying Living Expenses: Not hard at all  Food Insecurity: No Food Insecurity (12/09/2020)   Hunger Vital Sign    Worried About Running Out of Food in the Last Year: Never true    Ran Out of Food in the Last Year: Never true  Transportation Needs: No Transportation Needs (12/09/2020)   PRAPARE - Administrator, Civil Service (Medical): No    Lack of Transportation (Non-Medical): No  Physical Activity: Inactive (12/09/2020)    Exercise Vital Sign    Days of Exercise per Week: 0 days    Minutes of Exercise per Session: 0 min  Stress: No Stress Concern Present (12/09/2020)   [employer] of Occupational Health - Occupational Stress Questionnaire    Feeling of Stress : Not at all  Social Connections: Moderately Isolated (12/09/2020)   Social Connection and Isolation Panel [NHANES]    Frequency of Communication with Friends and Family: More than three times a week    Frequency of Social Gatherings with Friends and Family: More than three times a week    Attends Religious Services: More than 4 times per year    Active Member of [employer] or Organizations: No    Attends Banker Meetings: Never    Marital Status: Divorced  Catering manager Violence: Not At Risk (12/09/2020)   Humiliation, Afraid, Rape, and Kick questionnaire    Fear of Current or Ex-Partner: No    Emotionally Abused: No    Physically Abused: No    Sexually Abused: No    Family History: Family History  Problem Relation Age of Onset   Obesity Sister     Current Medications:  Current Outpatient Medications:    acyclovir (ZOVIRAX) 400 MG tablet, Take 400 mg by mouth 2 (two) times daily., Disp: , Rfl:    aspirin 81 MG tablet, Take 1 tablet (81 mg total) by mouth daily with breakfast., Disp: 30 tablet, Rfl: 5   bortezomib IV (VELCADE) 3.5 MG injection, 3.5 mg once a week. weekly, Disp: , Rfl:    chlorhexidine (PERIDEX) 0.12 % solution, Use as directed 15 mLs in the mouth or throat 3 (three) times daily., Disp: , Rfl:    cholecalciferol (VITAMIN D) 25 MCG (1000 UNIT) tablet, 1 capsule, Disp: , Rfl:    glucose blood (ACCU-CHEK AVIVA PLUS) test strip,  CHECK BLOOD SUGAR ONCE DAILY, Disp: , Rfl:    GNP ASPIRIN LOW DOSE 81 MG tablet, Take 81 mg by mouth daily., Disp: , Rfl:    magnesium oxide (MAG-OX) 400 (240 Mg) MG tablet, TAKE ONE TABLET BY MOUTH THREE TIMES A DAY, Disp: 90 tablet, Rfl: 3   mirtazapine (REMERON) 15 MG tablet, Take 0.5  tablets (7.5 mg total) by mouth at bedtime. For appetite stimulation, Disp: 30 tablet, Rfl: 2   ondansetron (ZOFRAN) 8 MG tablet, , Disp: , Rfl:    Potassium Acetate POWD, Take 20 mEq by mouth daily., Disp: 12000 g, Rfl: 5   potassium chloride (KLOR-CON) 20 MEQ packet, MIX AND TAKE ONE PACKET DAILY., Disp: 30 packet, Rfl: 3 No current facility-administered medications for this visit.  Facility-Administered Medications Ordered in Other Visits:    bortezomib SQ (VELCADE) chemo injection (2.5mg /mL concentration) 2.25 mg, 1.3 mg/m2 (Treatment Plan Recorded), Subcutaneous, Once, [name], MD   dexamethasone (DECADRON) tablet 20 mg, 20 mg, Oral, Once, [name], MD   insulin aspart (novoLOG) injection 10 Units, 10 Units, Subcutaneous, Once, [name], MD   magnesium oxide (MAG-OX) tablet 400 mg, 400 mg, Oral, Once, [name], MD   prochlorperazine (COMPAZINE) tablet 10 mg, 10 mg, Oral, Once, [name], MD   Allergies: Allergies  Allergen Reactions   Other Other (See Comments)   Seasonal Ic [Cholestatin] Other (See Comments)    Sneezing, watery eyes   Motrin [Ibuprofen] Rash    REVIEW OF SYSTEMS:   Review of Systems  Constitutional:  Negative for chills, fatigue and fever.  HENT:   Negative for lump/mass, mouth sores, nosebleeds, sore throat and trouble swallowing.   Eyes:  Negative for eye problems.  Respiratory:  Negative for cough and shortness of breath.   Cardiovascular:  Negative for chest pain, leg swelling and palpitations.  Gastrointestinal:  Negative for abdominal pain, constipation, diarrhea, nausea and vomiting.  Genitourinary:  Negative for bladder incontinence, difficulty urinating, dysuria, frequency, hematuria and nocturia.   Musculoskeletal:  Negative for arthralgias, back pain, flank pain, myalgias and neck pain.  Skin:  Negative for itching and rash.  Neurological:  Negative for dizziness, headaches and numbness.   Hematological:  Does not bruise/bleed easily.  Psychiatric/Behavioral:  Negative for depression, sleep disturbance and suicidal ideas. The patient is not nervous/anxious.   All other systems reviewed and are negative.    VITALS:   Blood pressure (!) 164/74, pulse 75, temperature 98.4 F (36.9 C), temperature source Oral, resp. rate 18, height 5\' 2"  (1.575 m), weight 174 lb 6.4 oz (79.1 kg), SpO2 96 %.  Wt Readings from Last 3 Encounters:  06/16/23 174 lb 6.4 oz (79.1 kg)  05/20/23 172 lb 9.6 oz (78.3 kg)  05/04/23 170 lb 3.1 oz (77.2 kg)    Body mass index is 31.9 kg/m.  Performance status (ECOG): 1 - Symptomatic but completely ambulatory  PHYSICAL EXAM:   Physical Exam Vitals and nursing note reviewed. Exam conducted with a chaperone present.  Constitutional:      Appearance: Normal appearance.  Cardiovascular:     Rate and Rhythm: Normal rate and regular rhythm.     Pulses: Normal pulses.     Heart sounds: Normal heart sounds.  Pulmonary:     Effort: Pulmonary effort is normal.     Breath sounds: Normal breath sounds.  Abdominal:     Palpations: Abdomen is soft. There is no hepatomegaly, splenomegaly or mass.     Tenderness: There is no abdominal tenderness.  Musculoskeletal:     Right lower leg: No edema.     Left lower leg: No edema.  Lymphadenopathy:     Cervical: No cervical adenopathy.     Right cervical: No superficial, deep or posterior cervical adenopathy.    Left cervical: No superficial, deep or posterior cervical adenopathy.     Upper Body:     Right upper body: No supraclavicular or axillary adenopathy.     Left upper body: No supraclavicular or axillary adenopathy.  Skin:    Comments: Ecchymosis in the left periorbital fossa and right forearm.  Neurological:     General: No focal deficit present.     Mental Status: She is alert and oriented to person, place, and time.  Psychiatric:        Mood and Affect: Mood normal.        Behavior: Behavior  normal.     LABS:      Latest Ref Rng & Units 06/16/2023   10:34 AM 06/02/2023   12:27 PM 05/20/2023   12:46 PM  CBC  WBC 4.0 - 10.5 K/uL 5.4  5.4  5.4   Hemoglobin 12.0 - 15.0 g/dL 16.1  09.6  04.5   Hematocrit 36.0 - 46.0 % 31.9  33.2  33.5   Platelets 150 - 400 K/uL 180  205  195       Latest Ref Rng & Units 06/16/2023   10:34 AM 06/02/2023   12:27 PM 05/20/2023   12:46 PM  CMP  Glucose 70 - 99 mg/dL 409  811  914   BUN 8 - 23 mg/dL 22  19  16    Creatinine 0.44 - 1.00 mg/dL 7.82  9.56  2.13   Sodium 135 - 145 mmol/L 134  139  132   Potassium 3.5 - 5.1 mmol/L 3.7  3.5  4.2   Chloride 98 - 111 mmol/L 102  106  97   CO2 22 - 32 mmol/L 24  24  27    Calcium 8.9 - 10.3 mg/dL 9.1  9.4  9.5   Total Protein 6.5 - 8.1 g/dL 6.9  7.0  7.3   Total Bilirubin 0.3 - 1.2 mg/dL 0.7  0.8  0.8   Alkaline Phos 38 - 126 U/L 91  102  120   AST 15 - 41 U/L 13  15  15    ALT 0 - 44 U/L 12  11  11       No results found for: "CEA1", "CEA" / No results found for: "CEA1", "CEA" No results found for: "PSA1" No results found for: "CAN199" No results found for: "CAN125"  Lab Results  Component Value Date   TOTALPROTELP 6.5 06/02/2023   ALBUMINELP 3.3 06/02/2023   A1GS 0.2 06/02/2023   A2GS 0.7 06/02/2023   BETS 1.4 (H) 06/02/2023   GAMS 0.8 06/02/2023   MSPIKE Not Observed 06/02/2023   SPEI Comment 06/02/2023   Lab Results  Component Value Date   TIBC 137 (L) 05/20/2023   TIBC 117 (L) 03/24/2023   TIBC 107 (L) 01/26/2023   FERRITIN 267 05/20/2023   FERRITIN 257 03/24/2023   FERRITIN 339 (H) 01/26/2023   IRONPCTSAT 33 (H) 05/20/2023   IRONPCTSAT 44 (H) 03/24/2023   IRONPCTSAT 50 (H) 01/26/2023   Lab Results  Component Value Date   LDH 137 05/20/2023   LDH 134 03/24/2023   LDH 125 01/26/2023     STUDIES:   No results found.

## 2023-06-16 ENCOUNTER — Emergency Department (HOSPITAL_COMMUNITY)
Admission: EM | Admit: 2023-06-16 | Discharge: 2023-06-16 | Disposition: A | Discharge status: Home / Self Care | Payer: Medicare Other | Attending: Emergency Medicine | Admitting: Emergency Medicine

## 2023-06-16 ENCOUNTER — Encounter (HOSPITAL_COMMUNITY): Payer: Self-pay | Admitting: Emergency Medicine

## 2023-06-16 ENCOUNTER — Emergency Department (HOSPITAL_COMMUNITY): Payer: Medicare Other

## 2023-06-16 ENCOUNTER — Other Ambulatory Visit: Payer: Self-pay

## 2023-06-16 ENCOUNTER — Inpatient Hospital Stay: Payer: Medicare Other

## 2023-06-16 ENCOUNTER — Inpatient Hospital Stay (HOSPITAL_BASED_OUTPATIENT_CLINIC_OR_DEPARTMENT_OTHER): Payer: Medicare Other | Admitting: Hematology

## 2023-06-16 VITALS — BP 164/74 | HR 75 | Temp 98.4°F | Resp 18 | Ht 62.0 in | Wt 174.4 lb

## 2023-06-16 DIAGNOSIS — Z7982 Long term (current) use of aspirin: Secondary | ICD-10-CM | POA: Diagnosis not present

## 2023-06-16 DIAGNOSIS — W108XXA Fall (on) (from) other stairs and steps, initial encounter: Secondary | ICD-10-CM | POA: Insufficient documentation

## 2023-06-16 DIAGNOSIS — C9000 Multiple myeloma not having achieved remission: Secondary | ICD-10-CM

## 2023-06-16 DIAGNOSIS — S0083XA Contusion of other part of head, initial encounter: Secondary | ICD-10-CM | POA: Diagnosis not present

## 2023-06-16 DIAGNOSIS — S0990XA Unspecified injury of head, initial encounter: Secondary | ICD-10-CM | POA: Diagnosis present

## 2023-06-16 DIAGNOSIS — E538 Deficiency of other specified B group vitamins: Secondary | ICD-10-CM

## 2023-06-16 LAB — COMPREHENSIVE METABOLIC PANEL
ALT: 12 U/L (ref 0–44)
AST: 13 U/L — ABNORMAL LOW (ref 15–41)
Albumin: 3.3 g/dL — ABNORMAL LOW (ref 3.5–5.0)
Alkaline Phosphatase: 91 U/L (ref 38–126)
Anion gap: 8 (ref 5–15)
BUN: 22 mg/dL (ref 8–23)
CO2: 24 mmol/L (ref 22–32)
Calcium: 9.1 mg/dL (ref 8.9–10.3)
Chloride: 102 mmol/L (ref 98–111)
Creatinine, Ser: 1.11 mg/dL — ABNORMAL HIGH (ref 0.44–1.00)
GFR, Estimated: 49 mL/min — ABNORMAL LOW (ref >60)
Glucose, Bld: 307 mg/dL — ABNORMAL HIGH (ref 70–99)
Potassium: 3.7 mmol/L (ref 3.5–5.1)
Sodium: 134 mmol/L — ABNORMAL LOW (ref 135–145)
Total Bilirubin: 0.7 mg/dL (ref 0.3–1.2)
Total Protein: 6.9 g/dL (ref 6.5–8.1)

## 2023-06-16 LAB — MAGNESIUM: Magnesium: 1.6 mg/dL — ABNORMAL LOW (ref 1.7–2.4)

## 2023-06-16 LAB — CBC WITH DIFFERENTIAL/PLATELET
Abs Immature Granulocytes: 0.01 10*3/uL (ref 0.00–0.07)
Basophils Absolute: 0 10*3/uL (ref 0.0–0.1)
Basophils Relative: 0 %
Eosinophils Absolute: 0.1 10*3/uL (ref 0.0–0.5)
Eosinophils Relative: 1 %
HCT: 31.9 % — ABNORMAL LOW (ref 36.0–46.0)
Hemoglobin: 10.4 g/dL — ABNORMAL LOW (ref 12.0–15.0)
Immature Granulocytes: 0 %
Lymphocytes Relative: 23 %
Lymphs Abs: 1.3 10*3/uL (ref 0.7–4.0)
MCH: 33.2 pg (ref 26.0–34.0)
MCHC: 32.6 g/dL (ref 30.0–36.0)
MCV: 101.9 fL — ABNORMAL HIGH (ref 80.0–100.0)
Monocytes Absolute: 0.5 10*3/uL (ref 0.1–1.0)
Monocytes Relative: 9 %
Neutro Abs: 3.6 10*3/uL (ref 1.7–7.7)
Neutrophils Relative %: 67 %
Platelets: 180 10*3/uL (ref 150–400)
RBC: 3.13 MIL/uL — ABNORMAL LOW (ref 3.87–5.11)
RDW: 13.7 % (ref 11.5–15.5)
WBC: 5.4 10*3/uL (ref 4.0–10.5)
nRBC: 0 % (ref 0.0–0.2)

## 2023-06-16 MED ORDER — PROCHLORPERAZINE MALEATE 10 MG PO TABS
10.0000 mg | ORAL_TABLET | Freq: Once | ORAL | Status: AC
  Administered 2023-06-16: 10 mg via ORAL
  Filled 2023-06-16: qty 1

## 2023-06-16 MED ORDER — MAGNESIUM OXIDE -MG SUPPLEMENT 400 (240 MG) MG PO TABS
400.0000 mg | ORAL_TABLET | Freq: Once | ORAL | Status: AC
  Administered 2023-06-16: 400 mg via ORAL
  Filled 2023-06-16: qty 1

## 2023-06-16 MED ORDER — TETRACAINE HCL 0.5 % OP SOLN
2.0000 [drp] | Freq: Once | OPHTHALMIC | Status: DC
  Filled 2023-06-16: qty 4

## 2023-06-16 MED ORDER — DEXAMETHASONE 4 MG PO TABS
20.0000 mg | ORAL_TABLET | Freq: Once | ORAL | Status: AC
  Administered 2023-06-16: 20 mg via ORAL
  Filled 2023-06-16: qty 5

## 2023-06-16 MED ORDER — BORTEZOMIB CHEMO SQ INJECTION 3.5 MG (2.5MG/ML)
1.3000 mg/m2 | Freq: Once | INTRAMUSCULAR | Status: AC
  Administered 2023-06-16: 2.25 mg via SUBCUTANEOUS
  Filled 2023-06-16: qty 0.9

## 2023-06-16 MED ORDER — FLUORESCEIN SODIUM 1 MG OP STRP
1.0000 | ORAL_STRIP | Freq: Once | OPHTHALMIC | Status: DC
  Filled 2023-06-16: qty 1

## 2023-06-16 MED ORDER — CYANOCOBALAMIN 1000 MCG/ML IJ SOLN
1000.0000 ug | Freq: Once | INTRAMUSCULAR | Status: AC
  Administered 2023-06-16: 1000 ug via INTRAMUSCULAR
  Filled 2023-06-16: qty 1

## 2023-06-16 MED ORDER — INSULIN ASPART 100 UNIT/ML IJ SOLN
10.0000 [IU] | Freq: Once | INTRAMUSCULAR | Status: AC
  Administered 2023-06-16: 10 [IU] via SUBCUTANEOUS
  Filled 2023-06-16: qty 0.1

## 2023-06-16 NOTE — Discharge Instructions (Signed)
Take Tylenol for pain and follow-up with your family doctor next week for recheck °

## 2023-06-16 NOTE — ED Triage Notes (Signed)
Pt via POV c/o left eye pain and bruising, unknown duration. Pt has a cyst to right eye which has been there for a long time, but bruising around left eye socket is new. Family thinks she may have fallen but pt is unsure. They also note that she might have been splashed by hot grease recently. Pt does not take blood thinners. No known recent falls per pt and family. No changes to vision.

## 2023-06-16 NOTE — ED Notes (Signed)
Patient transported to CT 

## 2023-06-16 NOTE — Patient Instructions (Addendum)
[facility] at [facility] Discharge Instructions   You were seen and examined today by Dr. Ellin Saba.  He reviewed the results of your lab work. Your magnesium is slightly low at 1.6. We will give you magnesium in the clinic today.   Return as scheduled.    Thank you for choosing [facility] at [facility] to provide your oncology and hematology care.  To afford each patient quality time with our provider, please arrive at least 15 minutes before your scheduled appointment time.   If you have a lab appointment with the Cancer Center please come in thru the Main Entrance and check in at the main information desk.  You need to re-schedule your appointment should you arrive 10 or more minutes late.  We strive to give you quality time with our providers, and arriving late affects you and other patients whose appointments are after yours.  Also, if you no show three or more times for appointments you may be dismissed from the clinic at the providers discretion.     Again, thank you for choosing [facility].  Our hope is that these requests will decrease the amount of time that you wait before being seen by our physicians.       _____________________________________________________________  Should you have questions after your visit to [facility], please contact our office at [phone] and follow the prompts.  Our office hours are 8:00 a.m. and 4:30 p.m. Monday - Friday.  Please note that voicemails left after 4:00 p.m. may not be returned until the following business day.  We are closed weekends and major holidays.  You do have access to a nurse 24-7, just call the main number to the clinic [phone] and do not press any options, hold on the line and a nurse will answer the phone.    For prescription refill requests, have your pharmacy contact our office and allow 72 hours.    Due to Covid, you will need to wear a mask  upon entering the hospital. If you do not have a mask, a mask will be given to you at the Main Entrance upon arrival. For doctor visits, patients may have 1 support person age 39 or older with them. For treatment visits, patients can not have anyone with them due to social distancing guidelines and our immunocompromised population.

## 2023-06-16 NOTE — Progress Notes (Signed)
Patient presents today for Velcade and B12 injections per providers order.  Vital signs and labs reviewed by MD.  Patient has no new complaints at this time.  Message received from Chapman Moss RN/Dr. Ellin Saba patient okay for treatment.  Patient also receiving 10 Units of Novolog per providers order.  Stable during administration without incident; injection sites WNL; see MAR for injection details.  Patient tolerated procedure well and without incident.  No questions or complaints noted at this time.

## 2023-06-16 NOTE — Patient Instructions (Signed)
[facility]  Discharge Instructions: Thank you for choosing [facility] to provide your oncology and hematology care.  If you have a lab appointment with the Cancer Center - please note that after April 8th, 2024, all labs will be drawn in the cancer center.  You do not have to check in or register with the main entrance as you have in the past but will complete your check-in in the cancer center.  Wear comfortable clothing and clothing appropriate for easy access to any Portacath or PICC line.   We strive to give you quality time with your provider. You may need to reschedule your appointment if you arrive late (15 or more minutes).  Arriving late affects you and other patients whose appointments are after yours.  Also, if you miss three or more appointments without notifying the office, you may be dismissed from the clinic at the provider's discretion.      For prescription refill requests, have your pharmacy contact our office and allow 72 hours for refills to be completed.    Today you received the following chemotherapy and/or immunotherapy agents Velcade/B12      To help prevent nausea and vomiting after your treatment, we encourage you to take your nausea medication as directed.  BELOW ARE SYMPTOMS THAT SHOULD BE REPORTED IMMEDIATELY: *FEVER GREATER THAN 100.4 F (38 C) OR HIGHER *CHILLS OR SWEATING *NAUSEA AND VOMITING THAT IS NOT CONTROLLED WITH YOUR NAUSEA MEDICATION *UNUSUAL SHORTNESS OF BREATH *UNUSUAL BRUISING OR BLEEDING *URINARY PROBLEMS (pain or burning when urinating, or frequent urination) *BOWEL PROBLEMS (unusual diarrhea, constipation, pain near the anus) TENDERNESS IN MOUTH AND THROAT WITH OR WITHOUT PRESENCE OF ULCERS (sore throat, sores in mouth, or a toothache) UNUSUAL RASH, SWELLING OR PAIN  UNUSUAL VAGINAL DISCHARGE OR ITCHING   Items with * indicate a potential emergency and should be followed up as soon as possible or go to the  Emergency Department if any problems should occur.  Please show the CHEMOTHERAPY ALERT CARD or IMMUNOTHERAPY ALERT CARD at check-in to the Emergency Department and triage nurse.  Should you have questions after your visit or need to cancel or reschedule your appointment, please contact [facility] [phone]  and follow the prompts.  Office hours are 8:00 a.m. to 4:30 p.m. Monday - Friday. Please note that voicemails left after 4:00 p.m. may not be returned until the following business day.  We are closed weekends and major holidays. You have access to a nurse at all times for urgent questions. Please call the main number to the clinic [phone] and follow the prompts.  For any non-urgent questions, you may also contact your provider using MyChart. We now offer e-Visits for anyone 16 and older to request care online for non-urgent symptoms. For details visit [email or web address].   Also download the MyChart app! Go to the app store, search "MyChart", open the app, select [place], and log in with your MyChart username and password.

## 2023-06-16 NOTE — ED Notes (Signed)
Pt states she was cooking 2 days ago and got popped in the eye by grease. Pt denies pain. Eye is red and slightly swollen. Pt denies pain.

## 2023-06-16 NOTE — ED Notes (Signed)
Pt back from CT

## 2023-06-17 ENCOUNTER — Other Ambulatory Visit: Payer: Self-pay

## 2023-06-18 NOTE — ED Provider Notes (Signed)
[place] EMERGENCY DEPARTMENT AT [facility] Provider Note   CSN: [id number] Arrival date & time: 06/16/23  1626     History  Chief Complaint  Patient presents with   Eye Problem    Brittany Archer is a 82 y.o. female.  Patient with bruising around her eyes.  Possible fall and contusion  The history is provided by the patient and medical records. No language interpreter was used.  Head Injury Location:  Generalized Mechanism of injury: fall   Fall:    Fall occurred:  Down stairs   Impact surface: Unknown.   Point of impact:  Head   Entrapped after fall: no   Pain details:    Quality:  Aching Associated symptoms: no headaches and no seizures        Home Medications Prior to Admission medications   Medication Sig Start Date End Date Taking? Authorizing Provider  acyclovir (ZOVIRAX) 400 MG tablet Take 400 mg by mouth 2 (two) times daily. 10/19/22   [provider]  aspirin 81 MG tablet Take 1 tablet (81 mg total) by mouth daily with breakfast. 03/14/22   Emokpae, Courage, MD  bortezomib IV (VELCADE) 3.5 MG injection 3.5 mg once a week. weekly    [provider]  chlorhexidine (PERIDEX) 0.12 % solution Use as directed 15 mLs in the mouth or throat 3 (three) times daily. 04/04/20   [provider]  cholecalciferol (VITAMIN D) 25 MCG (1000 UNIT) tablet 1 capsule    [provider]  glucose blood (ACCU-CHEK AVIVA PLUS) test strip CHECK BLOOD SUGAR ONCE DAILY    [provider]  GNP ASPIRIN LOW DOSE 81 MG tablet Take 81 mg by mouth daily. 07/09/22   [provider]  magnesium oxide (MAG-OX) 400 (240 Mg) MG tablet TAKE ONE TABLET BY MOUTH THREE TIMES A DAY 03/26/23   [name], MD  mirtazapine (REMERON) 15 MG tablet Take 0.5 tablets (7.5 mg total) by mouth at bedtime. For appetite stimulation 03/14/22   Shon Hale, MD  ondansetron (ZOFRAN) 8 MG tablet     [provider]  Potassium Acetate  POWD Take 20 mEq by mouth daily. 12/09/21   [name], MD  potassium chloride (KLOR-CON) 20 MEQ packet MIX AND TAKE ONE PACKET DAILY. 03/26/23   [name], MD      Allergies    Other, Seasonal ic [cholestatin], and Motrin [ibuprofen]    Review of Systems   Review of Systems  Constitutional:  Negative for appetite change and fatigue.  HENT:  Negative for congestion, ear discharge and sinus pressure.        Facial swelling  Eyes:  Negative for discharge.  Respiratory:  Negative for cough.   Cardiovascular:  Negative for chest pain.  Gastrointestinal:  Negative for abdominal pain and diarrhea.  Genitourinary:  Negative for frequency and hematuria.  Musculoskeletal:  Negative for back pain.  Skin:  Negative for rash.  Neurological:  Negative for seizures and headaches.  Psychiatric/Behavioral:  Negative for hallucinations.     Physical Exam Updated Vital Signs BP (!) 178/85   Pulse 69   Temp 97.7 F (36.5 C) (Oral)   Resp 20   Ht 5\' 2"  (1.575 m)   Wt 78.9 kg   SpO2 98%   BMI 31.83 kg/m  Physical Exam Vitals and nursing note reviewed.  Constitutional:      Appearance: She is well-developed.  HENT:     Head: Normocephalic.     Comments: Bruising  around both eyes    Nose: Nose normal.  Eyes:     General: No scleral icterus.    Extraocular Movements: Extraocular movements intact.     Conjunctiva/sclera: Conjunctivae normal.     Pupils: Pupils are equal, round, and reactive to light.  Neck:     Thyroid: No thyromegaly.  Cardiovascular:     Rate and Rhythm: Normal rate and regular rhythm.     Heart sounds: No murmur heard.    No friction rub. No gallop.  Pulmonary:     Breath sounds: No stridor. No wheezing or rales.  Chest:     Chest wall: No tenderness.  Abdominal:     General: There is no distension.     Tenderness: There is no abdominal tenderness. There is no rebound.  Musculoskeletal:        General: Normal range of motion.     Cervical  back: Neck supple.  Lymphadenopathy:     Cervical: No cervical adenopathy.  Skin:    Findings: No erythema or rash.  Neurological:     Mental Status: She is oriented to person, place, and time.     Motor: No abnormal muscle tone.     Coordination: Coordination normal.  Psychiatric:        Behavior: Behavior normal.     ED Results / Procedures / Treatments   Labs (all labs ordered are listed, but only abnormal results are displayed) Labs Reviewed - No data to display  EKG None  Radiology CT Head Wo Contrast  Result Date: 06/16/2023 CLINICAL DATA:  Trauma. EXAM: CT HEAD WITHOUT CONTRAST CT CERVICAL SPINE WITHOUT CONTRAST TECHNIQUE: Multidetector CT imaging of the head and cervical spine was performed following the standard protocol without intravenous contrast. Multiplanar CT image reconstructions of the cervical spine were also generated. RADIATION DOSE REDUCTION: This exam was performed according to the departmental dose-optimization program which includes automated exposure control, adjustment of the mA and/or kV according to patient size and/or use of iterative reconstruction technique. COMPARISON:  Head CT dated 02/19/2020. FINDINGS: CT HEAD FINDINGS Brain: Mild age-related atrophy and chronic microvascular ischemic changes. There is no acute intracranial hemorrhage. No mass effect or midline shift no extra-axial fluid collection. Vascular: No hyperdense vessel or unexpected calcification. Skull: Normal. Negative for fracture or focal lesion. Sinuses/Orbits: No acute finding. Other: Small left forehead contusion. CT CERVICAL SPINE FINDINGS Alignment: No acute subluxation. Skull base and vertebrae: No acute fracture.  Osteopenia. Soft tissues and spinal canal: No prevertebral fluid or swelling. No visible canal hematoma. Disc levels:  No acute findings.  Degenerative changes. Upper chest: Negative. Other: Bilateral carotid bulb calcified plaques. IMPRESSION: 1. No acute intracranial  pathology. Mild age-related atrophy and chronic microvascular ischemic changes. 2. No acute/traumatic cervical spine pathology. Electronically Signed   By: Elgie Collard M.D.   On: 06/16/2023 21:04   CT Cervical Spine Wo Contrast  Result Date: 06/16/2023 CLINICAL DATA:  Trauma. EXAM: CT HEAD WITHOUT CONTRAST CT CERVICAL SPINE WITHOUT CONTRAST TECHNIQUE: Multidetector CT imaging of the head and cervical spine was performed following the standard protocol without intravenous contrast. Multiplanar CT image reconstructions of the cervical spine were also generated. RADIATION DOSE REDUCTION: This exam was performed according to the departmental dose-optimization program which includes automated exposure control, adjustment of the mA and/or kV according to patient size and/or use of iterative reconstruction technique. COMPARISON:  Head CT dated 02/19/2020. FINDINGS: CT HEAD FINDINGS Brain: Mild age-related atrophy and chronic microvascular ischemic changes. There is no acute  intracranial hemorrhage. No mass effect or midline shift no extra-axial fluid collection. Vascular: No hyperdense vessel or unexpected calcification. Skull: Normal. Negative for fracture or focal lesion. Sinuses/Orbits: No acute finding. Other: Small left forehead contusion. CT CERVICAL SPINE FINDINGS Alignment: No acute subluxation. Skull base and vertebrae: No acute fracture.  Osteopenia. Soft tissues and spinal canal: No prevertebral fluid or swelling. No visible canal hematoma. Disc levels:  No acute findings.  Degenerative changes. Upper chest: Negative. Other: Bilateral carotid bulb calcified plaques. IMPRESSION: 1. No acute intracranial pathology. Mild age-related atrophy and chronic microvascular ischemic changes. 2. No acute/traumatic cervical spine pathology. Electronically Signed   By: Elgie Collard M.D.   On: 06/16/2023 21:04    Procedures Procedures    Medications Ordered in ED Medications - No data to display  ED  Course/ Medical Decision Making/ A&P                             Medical Decision Making Amount and/or Complexity of Data Reviewed Radiology: ordered.   Patient with bruising to face and CT head shows no acute injury.  Patient told to take Tylenol as needed and follow-up with her PCP        Final Clinical Impression(s) / ED Diagnoses Final diagnoses:  None    Rx / DC Orders ED Discharge Orders     None         Bethann Berkshire, MD 06/18/23 1319

## 2023-06-22 ENCOUNTER — Other Ambulatory Visit: Payer: Self-pay

## 2023-06-30 ENCOUNTER — Ambulatory Visit: Payer: Medicare Other | Admitting: Hematology

## 2023-06-30 ENCOUNTER — Ambulatory Visit: Payer: Medicare Other

## 2023-06-30 ENCOUNTER — Inpatient Hospital Stay: Payer: Medicare Other

## 2023-06-30 ENCOUNTER — Inpatient Hospital Stay: Payer: Medicare Other | Attending: Hematology

## 2023-06-30 ENCOUNTER — Other Ambulatory Visit: Payer: Medicare Other

## 2023-06-30 VITALS — BP 174/73 | HR 68 | Temp 97.8°F | Resp 18

## 2023-06-30 DIAGNOSIS — C9000 Multiple myeloma not having achieved remission: Secondary | ICD-10-CM | POA: Insufficient documentation

## 2023-06-30 DIAGNOSIS — Z5112 Encounter for antineoplastic immunotherapy: Secondary | ICD-10-CM | POA: Diagnosis present

## 2023-06-30 LAB — CBC WITH DIFFERENTIAL/PLATELET
Abs Immature Granulocytes: 0.01 10*3/uL (ref 0.00–0.07)
Basophils Absolute: 0 10*3/uL (ref 0.0–0.1)
Basophils Relative: 1 %
Eosinophils Absolute: 0.1 10*3/uL (ref 0.0–0.5)
Eosinophils Relative: 1 %
HCT: 32.4 % — ABNORMAL LOW (ref 36.0–46.0)
Hemoglobin: 10.7 g/dL — ABNORMAL LOW (ref 12.0–15.0)
Immature Granulocytes: 0 %
Lymphocytes Relative: 22 %
Lymphs Abs: 1 10*3/uL (ref 0.7–4.0)
MCH: 33.4 pg (ref 26.0–34.0)
MCHC: 33 g/dL (ref 30.0–36.0)
MCV: 101.3 fL — ABNORMAL HIGH (ref 80.0–100.0)
Monocytes Absolute: 0.4 10*3/uL (ref 0.1–1.0)
Monocytes Relative: 8 %
Neutro Abs: 3.3 10*3/uL (ref 1.7–7.7)
Neutrophils Relative %: 68 %
Platelets: 184 10*3/uL (ref 150–400)
RBC: 3.2 MIL/uL — ABNORMAL LOW (ref 3.87–5.11)
RDW: 13.5 % (ref 11.5–15.5)
WBC: 4.8 10*3/uL (ref 4.0–10.5)
nRBC: 0 % (ref 0.0–0.2)

## 2023-06-30 LAB — COMPREHENSIVE METABOLIC PANEL
ALT: 12 U/L (ref 0–44)
AST: 17 U/L (ref 15–41)
Albumin: 3.4 g/dL — ABNORMAL LOW (ref 3.5–5.0)
Alkaline Phosphatase: 96 U/L (ref 38–126)
Anion gap: 9 (ref 5–15)
BUN: 16 mg/dL (ref 8–23)
CO2: 22 mmol/L (ref 22–32)
Calcium: 8.9 mg/dL (ref 8.9–10.3)
Chloride: 108 mmol/L (ref 98–111)
Creatinine, Ser: 1.01 mg/dL — ABNORMAL HIGH (ref 0.44–1.00)
GFR, Estimated: 55 mL/min — ABNORMAL LOW (ref >60)
Glucose, Bld: 191 mg/dL — ABNORMAL HIGH (ref 70–99)
Potassium: 3.3 mmol/L — ABNORMAL LOW (ref 3.5–5.1)
Sodium: 139 mmol/L (ref 135–145)
Total Bilirubin: 0.7 mg/dL (ref 0.3–1.2)
Total Protein: 6.7 g/dL (ref 6.5–8.1)

## 2023-06-30 LAB — MAGNESIUM: Magnesium: 1.6 mg/dL — ABNORMAL LOW (ref 1.7–2.4)

## 2023-06-30 MED ORDER — POTASSIUM CHLORIDE CRYS ER 20 MEQ PO TBCR
40.0000 meq | EXTENDED_RELEASE_TABLET | Freq: Once | ORAL | Status: AC
  Administered 2023-06-30: 40 meq via ORAL
  Filled 2023-06-30: qty 2

## 2023-06-30 MED ORDER — PROCHLORPERAZINE MALEATE 10 MG PO TABS
10.0000 mg | ORAL_TABLET | Freq: Once | ORAL | Status: AC
  Administered 2023-06-30: 10 mg via ORAL
  Filled 2023-06-30: qty 1

## 2023-06-30 MED ORDER — BORTEZOMIB CHEMO SQ INJECTION 3.5 MG (2.5MG/ML)
1.3000 mg/m2 | Freq: Once | INTRAMUSCULAR | Status: AC
  Administered 2023-06-30: 2.25 mg via SUBCUTANEOUS
  Filled 2023-06-30: qty 0.9

## 2023-06-30 MED ORDER — DEXAMETHASONE 4 MG PO TABS
20.0000 mg | ORAL_TABLET | Freq: Once | ORAL | Status: AC
  Administered 2023-06-30: 20 mg via ORAL
  Filled 2023-06-30: qty 5

## 2023-06-30 NOTE — Patient Instructions (Signed)
[facility] [place]  Discharge Instructions: Thank you for choosing [facility] to provide your oncology and hematology care.  If you have a lab appointment with the Cancer Center - please note that after April 8th, 2024, all labs will be drawn in the cancer center.  You do not have to check in or register with the main entrance as you have in the past but will complete your check-in in the cancer center.  Wear comfortable clothing and clothing appropriate for easy access to any Portacath or PICC line.   We strive to give you quality time with your provider. You may need to reschedule your appointment if you arrive late (15 or more minutes).  Arriving late affects you and other patients whose appointments are after yours.  Also, if you miss three or more appointments without notifying the office, you may be dismissed from the clinic at the provider's discretion.      For prescription refill requests, have your pharmacy contact our office and allow 72 hours for refills to be completed.    Today you received the following chemotherapy and/or immunotherapy agents Velcade   To help prevent nausea and vomiting after your treatment, we encourage you to take your nausea medication as directed.  Bortezomib Injection What is this medication? BORTEZOMIB (bor TEZ oh mib) treats lymphoma. It may also be used to treat multiple myeloma, a type of bone marrow cancer. It works by blocking a protein that causes cancer cells to grow and multiply. This helps to slow or stop the spread of cancer cells. This medicine may be used for other purposes; ask your health care provider or pharmacist if you have questions. COMMON BRAND NAME(S): Velcade What should I tell my care team before I take this medication? They need to know if you have any of these conditions: Dehydration Diabetes Heart disease Liver disease Tingling of the fingers or toes or other nerve disorder An unusual or  allergic reaction to bortezomib, other medications, foods, dyes, or preservatives If you or your partner are pregnant or trying to get pregnant Breastfeeding How should I use this medication? This medication is injected into a vein or under the skin. It is given by your care team in a hospital or clinic setting. Talk to your care team about the use of this medication in children. Special care may be needed. Overdosage: If you think you have taken too much of this medicine contact a poison control center or emergency room at once. NOTE: This medicine is only for you. Do not share this medicine with others. What if I miss a dose? Keep appointments for follow-up doses. It is important not to miss your dose. Call your care team if you are unable to keep an appointment. What may interact with this medication? Ketoconazole Rifampin This list may not describe all possible interactions. Give your health care provider a list of all the medicines, herbs, non-prescription drugs, or dietary supplements you use. Also tell them if you smoke, drink alcohol, or use illegal drugs. Some items may interact with your medicine. What should I watch for while using this medication? Your condition will be monitored carefully while you are receiving this medication. You may need blood work while taking this medication. This medication may affect your coordination, reaction time, or judgment. Do not drive or operate machinery until you know how this medication affects you. Sit up or stand slowly to reduce the risk of dizzy or fainting spells. Drinking alcohol   with this medication can increase the risk of these side effects. This medication may increase your risk of getting an infection. Call your care team for advice if you get a fever, chills, sore throat, or other symptoms of a cold or flu. Do not treat yourself. Try to avoid being around people who are sick. Check with your care team if you have severe diarrhea, nausea,  and vomiting, or if you sweat a lot. The loss of too much body fluid may make it dangerous for you to take this medication. Talk to your care team if you may be pregnant. Serious birth defects can occur if you take this medication during pregnancy and for 7 months after the last dose. You will need a negative pregnancy test before starting this medication. Contraception is recommended while taking this medication and for 7 months after the last dose. Your care team can help you find the option that works for you. If your partner can get pregnant, use a condom during sex while taking this medication and for 4 months after the last dose. Do not breastfeed while taking this medication and for 2 months after the last dose. This medication may cause infertility. Talk to your care team if you are concerned about your fertility. What side effects may I notice from receiving this medication? Side effects that you should report to your care team as soon as possible: Allergic reactions--skin rash, itching, hives, swelling of the face, lips, tongue, or throat Bleeding--bloody or black, tar-like stools, vomiting blood or brown material that looks like coffee grounds, red or dark brown urine, small red or purple spots on skin, unusual bruising or bleeding Bleeding in the brain--severe headache, stiff neck, confusion, dizziness, change in vision, numbness or weakness of the face, arm, or leg, trouble speaking, trouble walking, vomiting Bowel blockage--stomach cramping, unable to have a bowel movement or pass gas, loss of appetite, vomiting Heart failure--shortness of breath, swelling of the ankles, feet, or hands, sudden weight gain, unusual weakness or fatigue Infection--fever, chills, cough, sore throat, wounds that don't heal, pain or trouble when passing urine, general feeling of discomfort or being unwell Liver injury--right upper belly pain, loss of appetite, nausea, light-colored stool, dark yellow or brown  urine, yellowing skin or eyes, unusual weakness or fatigue Low blood pressure--dizziness, feeling faint or lightheaded, blurry vision Lung injury--shortness of breath or trouble breathing, cough, spitting up blood, chest pain, fever Pain, tingling, or numbness in the hands or feet Severe or prolonged diarrhea Stomach pain, bloody diarrhea, pale skin, unusual weakness or fatigue, decrease in the amount of urine, which may be signs of hemolytic uremic syndrome Sudden and severe headache, confusion, change in vision, seizures, which may be signs of posterior reversible encephalopathy syndrome (PRES) TTP--purple spots on the skin or inside the mouth, pale skin, yellowing skin or eyes, unusual weakness or fatigue, fever, fast or irregular heartbeat, confusion, change in vision, trouble speaking, trouble walking Tumor lysis syndrome (TLS)--nausea, vomiting, diarrhea, decrease in the amount of urine, dark urine, unusual weakness or fatigue, confusion, muscle pain or cramps, fast or irregular heartbeat, joint pain Side effects that usually do not require medical attention (report to your care team if they continue or are bothersome): Constipation Diarrhea Fatigue Loss of appetite Nausea This list may not describe all possible side effects. Call your doctor for medical advice about side effects. You may report side effects to FDA at 1-800-FDA-1088. Where should I keep my medication? This medication is given in a hospital or   clinic. It will not be stored at home. NOTE: This sheet is a summary. It may not cover all possible information. If you have questions about this medicine, talk to your doctor, pharmacist, or health care provider.  2024 Elsevier/Gold Standard (2022-05-19 00:00:00)   BELOW ARE SYMPTOMS THAT SHOULD BE REPORTED IMMEDIATELY: *FEVER GREATER THAN 100.4 F (38 C) OR HIGHER *CHILLS OR SWEATING *NAUSEA AND VOMITING THAT IS NOT CONTROLLED WITH YOUR NAUSEA MEDICATION *UNUSUAL SHORTNESS OF  BREATH *UNUSUAL BRUISING OR BLEEDING *URINARY PROBLEMS (pain or burning when urinating, or frequent urination) *BOWEL PROBLEMS (unusual diarrhea, constipation, pain near the anus) TENDERNESS IN MOUTH AND THROAT WITH OR WITHOUT PRESENCE OF ULCERS (sore throat, sores in mouth, or a toothache) UNUSUAL RASH, SWELLING OR PAIN  UNUSUAL VAGINAL DISCHARGE OR ITCHING   Items with * indicate a potential emergency and should be followed up as soon as possible or go to the Emergency Department if any problems should occur.  Please show the CHEMOTHERAPY ALERT CARD or IMMUNOTHERAPY ALERT CARD at check-in to the Emergency Department and triage nurse.  Should you have questions after your visit or need to cancel or reschedule your appointment, please contact [facility] [place] [phone]  and follow the prompts.  Office hours are 8:00 a.m. to 4:30 p.m. Monday - Friday. Please note that voicemails left after 4:00 p.m. may not be returned until the following business day.  We are closed weekends and major holidays. You have access to a nurse at all times for urgent questions. Please call the main number to the clinic [phone] and follow the prompts.  For any non-urgent questions, you may also contact your provider using MyChart. We now offer e-Visits for anyone 18 and older to request care online for non-urgent symptoms. For details visit [email or web address].   Also download the MyChart app! Go to the app store, search "MyChart", open the app, select [place], and log in with your MyChart username and password.   

## 2023-06-30 NOTE — Progress Notes (Signed)
Patient presents today for Velcade infusion. Patient is in satisfactory condition with no new complaints voiced.  Vital signs are stable.  Labs reviewed and all labs are within treatment parameters. Pt's hemoglobin noted to be 10.7 today per paramters, pt does not need Retacrit injection. Pt will receive 40 mEq due to potassium of 3.3 today. We will proceed with treatment per MD orders.    Treatment given today per MD orders. Tolerated infusion without adverse affects. Vital signs stable. No complaints at this time. Discharged from clinic ambulatory in stable condition. Alert and oriented x 3. F/U with [facility] as scheduled.

## 2023-07-13 ENCOUNTER — Ambulatory Visit: Payer: Medicare Other | Admitting: Hematology

## 2023-07-13 ENCOUNTER — Other Ambulatory Visit: Payer: Medicare Other

## 2023-07-13 ENCOUNTER — Ambulatory Visit: Payer: Medicare Other

## 2023-07-14 ENCOUNTER — Inpatient Hospital Stay: Payer: Medicare Other

## 2023-07-14 VITALS — BP 167/73 | HR 65 | Temp 97.4°F | Resp 20 | Wt 170.0 lb

## 2023-07-14 DIAGNOSIS — Z5112 Encounter for antineoplastic immunotherapy: Secondary | ICD-10-CM | POA: Diagnosis not present

## 2023-07-14 DIAGNOSIS — C9000 Multiple myeloma not having achieved remission: Secondary | ICD-10-CM

## 2023-07-14 LAB — CBC WITH DIFFERENTIAL/PLATELET
Abs Immature Granulocytes: 0.01 10*3/uL (ref 0.00–0.07)
Basophils Absolute: 0 10*3/uL (ref 0.0–0.1)
Basophils Relative: 1 %
Eosinophils Absolute: 0.1 10*3/uL (ref 0.0–0.5)
Eosinophils Relative: 1 %
HCT: 34.2 % — ABNORMAL LOW (ref 36.0–46.0)
Hemoglobin: 11.3 g/dL — ABNORMAL LOW (ref 12.0–15.0)
Immature Granulocytes: 0 %
Lymphocytes Relative: 26 %
Lymphs Abs: 1.2 10*3/uL (ref 0.7–4.0)
MCH: 34 pg (ref 26.0–34.0)
MCHC: 33 g/dL (ref 30.0–36.0)
MCV: 103 fL — ABNORMAL HIGH (ref 80.0–100.0)
Monocytes Absolute: 0.4 10*3/uL (ref 0.1–1.0)
Monocytes Relative: 8 %
Neutro Abs: 2.8 10*3/uL (ref 1.7–7.7)
Neutrophils Relative %: 64 %
Platelets: 179 10*3/uL (ref 150–400)
RBC: 3.32 MIL/uL — ABNORMAL LOW (ref 3.87–5.11)
RDW: 13.4 % (ref 11.5–15.5)
WBC: 4.4 10*3/uL (ref 4.0–10.5)
nRBC: 0 % (ref 0.0–0.2)

## 2023-07-14 LAB — COMPREHENSIVE METABOLIC PANEL
ALT: 10 U/L (ref 0–44)
AST: 12 U/L — ABNORMAL LOW (ref 15–41)
Albumin: 3.5 g/dL (ref 3.5–5.0)
Alkaline Phosphatase: 87 U/L (ref 38–126)
Anion gap: 7 (ref 5–15)
BUN: 17 mg/dL (ref 8–23)
CO2: 25 mmol/L (ref 22–32)
Calcium: 9.2 mg/dL (ref 8.9–10.3)
Chloride: 107 mmol/L (ref 98–111)
Creatinine, Ser: 1.14 mg/dL — ABNORMAL HIGH (ref 0.44–1.00)
GFR, Estimated: 48 mL/min — ABNORMAL LOW (ref >60)
Glucose, Bld: 180 mg/dL — ABNORMAL HIGH (ref 70–99)
Potassium: 3.6 mmol/L (ref 3.5–5.1)
Sodium: 139 mmol/L (ref 135–145)
Total Bilirubin: 1.1 mg/dL (ref 0.3–1.2)
Total Protein: 6.9 g/dL (ref 6.5–8.1)

## 2023-07-14 LAB — MAGNESIUM: Magnesium: 1.8 mg/dL (ref 1.7–2.4)

## 2023-07-14 MED ORDER — DEXAMETHASONE 4 MG PO TABS
20.0000 mg | ORAL_TABLET | Freq: Once | ORAL | Status: AC
  Administered 2023-07-14: 20 mg via ORAL
  Filled 2023-07-14: qty 5

## 2023-07-14 MED ORDER — PROCHLORPERAZINE MALEATE 10 MG PO TABS
10.0000 mg | ORAL_TABLET | Freq: Once | ORAL | Status: AC
  Administered 2023-07-14: 10 mg via ORAL
  Filled 2023-07-14: qty 1

## 2023-07-14 MED ORDER — BORTEZOMIB CHEMO SQ INJECTION 3.5 MG (2.5MG/ML)
1.3000 mg/m2 | Freq: Once | INTRAMUSCULAR | Status: AC
  Administered 2023-07-14: 2.25 mg via SUBCUTANEOUS
  Filled 2023-07-14: qty 0.9

## 2023-07-19 ENCOUNTER — Other Ambulatory Visit: Payer: Self-pay

## 2023-07-21 ENCOUNTER — Other Ambulatory Visit: Payer: Self-pay

## 2023-07-27 ENCOUNTER — Other Ambulatory Visit: Payer: Self-pay

## 2023-07-27 ENCOUNTER — Other Ambulatory Visit: Payer: Self-pay | Admitting: Hematology

## 2023-07-27 DIAGNOSIS — C9000 Multiple myeloma not having achieved remission: Secondary | ICD-10-CM

## 2023-07-28 ENCOUNTER — Inpatient Hospital Stay: Payer: Medicare Other

## 2023-07-28 VITALS — BP 168/78 | HR 70 | Temp 97.5°F | Resp 18

## 2023-07-28 DIAGNOSIS — C9000 Multiple myeloma not having achieved remission: Secondary | ICD-10-CM

## 2023-07-28 DIAGNOSIS — Z5112 Encounter for antineoplastic immunotherapy: Secondary | ICD-10-CM | POA: Diagnosis not present

## 2023-07-28 LAB — CBC WITH DIFFERENTIAL/PLATELET
Abs Immature Granulocytes: 0.01 10*3/uL (ref 0.00–0.07)
Basophils Absolute: 0 10*3/uL (ref 0.0–0.1)
Basophils Relative: 0 %
Eosinophils Absolute: 0 10*3/uL (ref 0.0–0.5)
Eosinophils Relative: 1 %
HCT: 33 % — ABNORMAL LOW (ref 36.0–46.0)
Hemoglobin: 10.9 g/dL — ABNORMAL LOW (ref 12.0–15.0)
Immature Granulocytes: 0 %
Lymphocytes Relative: 29 %
Lymphs Abs: 1.4 10*3/uL (ref 0.7–4.0)
MCH: 33.7 pg (ref 26.0–34.0)
MCHC: 33 g/dL (ref 30.0–36.0)
MCV: 102.2 fL — ABNORMAL HIGH (ref 80.0–100.0)
Monocytes Absolute: 0.4 10*3/uL (ref 0.1–1.0)
Monocytes Relative: 9 %
Neutro Abs: 2.9 10*3/uL (ref 1.7–7.7)
Neutrophils Relative %: 61 %
Platelets: 169 10*3/uL (ref 150–400)
RBC: 3.23 MIL/uL — ABNORMAL LOW (ref 3.87–5.11)
RDW: 13.2 % (ref 11.5–15.5)
WBC: 4.8 10*3/uL (ref 4.0–10.5)
nRBC: 0 % (ref 0.0–0.2)

## 2023-07-28 LAB — MAGNESIUM: Magnesium: 1.6 mg/dL — ABNORMAL LOW (ref 1.7–2.4)

## 2023-07-28 LAB — COMPREHENSIVE METABOLIC PANEL
ALT: 15 U/L (ref 0–44)
AST: 16 U/L (ref 15–41)
Albumin: 3.4 g/dL — ABNORMAL LOW (ref 3.5–5.0)
Alkaline Phosphatase: 93 U/L (ref 38–126)
Anion gap: 6 (ref 5–15)
BUN: 18 mg/dL (ref 8–23)
CO2: 25 mmol/L (ref 22–32)
Calcium: 9.1 mg/dL (ref 8.9–10.3)
Chloride: 109 mmol/L (ref 98–111)
Creatinine, Ser: 1.09 mg/dL — ABNORMAL HIGH (ref 0.44–1.00)
GFR, Estimated: 50 mL/min — ABNORMAL LOW (ref >60)
Glucose, Bld: 154 mg/dL — ABNORMAL HIGH (ref 70–99)
Potassium: 3.5 mmol/L (ref 3.5–5.1)
Sodium: 140 mmol/L (ref 135–145)
Total Bilirubin: 0.7 mg/dL (ref 0.3–1.2)
Total Protein: 6.7 g/dL (ref 6.5–8.1)

## 2023-07-28 MED ORDER — PROCHLORPERAZINE MALEATE 10 MG PO TABS
10.0000 mg | ORAL_TABLET | Freq: Once | ORAL | Status: AC
  Administered 2023-07-28: 10 mg via ORAL
  Filled 2023-07-28: qty 1

## 2023-07-28 MED ORDER — BORTEZOMIB CHEMO SQ INJECTION 3.5 MG (2.5MG/ML)
1.3000 mg/m2 | Freq: Once | INTRAMUSCULAR | Status: AC
  Administered 2023-07-28: 2.25 mg via SUBCUTANEOUS
  Filled 2023-07-28: qty 0.9

## 2023-07-28 MED ORDER — DEXAMETHASONE 4 MG PO TABS
20.0000 mg | ORAL_TABLET | Freq: Once | ORAL | Status: AC
  Administered 2023-07-28: 20 mg via ORAL
  Filled 2023-07-28: qty 5

## 2023-07-28 NOTE — Patient Instructions (Signed)

## 2023-07-28 NOTE — Progress Notes (Signed)
Magnesium 1.6 today and patient declined IV Magnesium.    Patient tolerated Velcade injection with no complaints voiced.  Lab work reviewed.  See MAR for details.  Injection site clean and dry with no bruising or swelling noted.  Patient stable during and after injection.  Band aid applied.  VSS.  Patient left in satisfactory condition with no s/s of distress noted.

## 2023-08-06 ENCOUNTER — Encounter (HOSPITAL_COMMUNITY): Payer: Self-pay | Admitting: Hematology

## 2023-08-06 ENCOUNTER — Encounter: Payer: Self-pay | Admitting: Hematology

## 2023-08-11 ENCOUNTER — Encounter: Payer: Self-pay | Admitting: *Deleted

## 2023-08-11 ENCOUNTER — Inpatient Hospital Stay: Payer: Medicare HMO | Attending: Hematology | Admitting: Hematology

## 2023-08-11 ENCOUNTER — Inpatient Hospital Stay: Payer: Medicare HMO

## 2023-08-11 VITALS — BP 176/72 | HR 78 | Temp 98.2°F | Resp 18 | Wt 165.8 lb

## 2023-08-11 DIAGNOSIS — Z79899 Other long term (current) drug therapy: Secondary | ICD-10-CM | POA: Insufficient documentation

## 2023-08-11 DIAGNOSIS — C9000 Multiple myeloma not having achieved remission: Secondary | ICD-10-CM

## 2023-08-11 DIAGNOSIS — Z5112 Encounter for antineoplastic immunotherapy: Secondary | ICD-10-CM | POA: Diagnosis not present

## 2023-08-11 DIAGNOSIS — E876 Hypokalemia: Secondary | ICD-10-CM

## 2023-08-11 LAB — COMPREHENSIVE METABOLIC PANEL
ALT: 10 U/L (ref 0–44)
AST: 16 U/L (ref 15–41)
Albumin: 3.2 g/dL — ABNORMAL LOW (ref 3.5–5.0)
Alkaline Phosphatase: 80 U/L (ref 38–126)
Anion gap: 10 (ref 5–15)
BUN: 11 mg/dL (ref 8–23)
CO2: 24 mmol/L (ref 22–32)
Calcium: 8.5 mg/dL — ABNORMAL LOW (ref 8.9–10.3)
Chloride: 102 mmol/L (ref 98–111)
Creatinine, Ser: 1.03 mg/dL — ABNORMAL HIGH (ref 0.44–1.00)
GFR, Estimated: 54 mL/min — ABNORMAL LOW (ref >60)
Glucose, Bld: 276 mg/dL — ABNORMAL HIGH (ref 70–99)
Potassium: 2.6 mmol/L — CL (ref 3.5–5.1)
Sodium: 136 mmol/L (ref 135–145)
Total Bilirubin: 0.7 mg/dL (ref 0.3–1.2)
Total Protein: 7.1 g/dL (ref 6.5–8.1)

## 2023-08-11 LAB — CBC WITH DIFFERENTIAL/PLATELET
Abs Immature Granulocytes: 0 10*3/uL (ref 0.00–0.07)
Basophils Absolute: 0 10*3/uL (ref 0.0–0.1)
Basophils Relative: 0 %
Eosinophils Absolute: 0 10*3/uL (ref 0.0–0.5)
Eosinophils Relative: 0 %
HCT: 31.8 % — ABNORMAL LOW (ref 36.0–46.0)
Hemoglobin: 10.7 g/dL — ABNORMAL LOW (ref 12.0–15.0)
Lymphocytes Relative: 33 %
Lymphs Abs: 1.2 10*3/uL (ref 0.7–4.0)
MCH: 33.8 pg (ref 26.0–34.0)
MCHC: 33.6 g/dL (ref 30.0–36.0)
MCV: 100.3 fL — ABNORMAL HIGH (ref 80.0–100.0)
Monocytes Absolute: 0 10*3/uL — ABNORMAL LOW (ref 0.1–1.0)
Monocytes Relative: 1 %
Neutro Abs: 2.3 10*3/uL (ref 1.7–7.7)
Neutrophils Relative %: 66 %
Platelets: 184 10*3/uL (ref 150–400)
RBC: 3.17 MIL/uL — ABNORMAL LOW (ref 3.87–5.11)
RDW: 13.3 % (ref 11.5–15.5)
WBC: 3.5 10*3/uL — ABNORMAL LOW (ref 4.0–10.5)
nRBC: 0 % (ref 0.0–0.2)

## 2023-08-11 LAB — MAGNESIUM: Magnesium: 1.5 mg/dL — ABNORMAL LOW (ref 1.7–2.4)

## 2023-08-11 MED ORDER — SODIUM CHLORIDE 0.9 % IV SOLN: INTRAVENOUS | Status: DC

## 2023-08-11 MED ORDER — MAGNESIUM SULFATE 2 GM/50ML IV SOLN
2.0000 g | Freq: Once | INTRAVENOUS | Status: AC
  Administered 2023-08-11: 2 g via INTRAVENOUS
  Filled 2023-08-11: qty 50

## 2023-08-11 MED ORDER — PROCHLORPERAZINE MALEATE 10 MG PO TABS
10.0000 mg | ORAL_TABLET | Freq: Once | ORAL | Status: AC
  Administered 2023-08-11: 10 mg via ORAL
  Filled 2023-08-11: qty 1

## 2023-08-11 MED ORDER — POTASSIUM CHLORIDE 20 MEQ/100ML IV SOLN
20.0000 meq | Freq: Once | INTRAVENOUS | Status: DC
  Filled 2023-08-11: qty 100

## 2023-08-11 MED ORDER — POTASSIUM CHLORIDE CRYS ER 20 MEQ PO TBCR
40.0000 meq | EXTENDED_RELEASE_TABLET | ORAL | Status: AC
  Administered 2023-08-11 (×2): 40 meq via ORAL
  Filled 2023-08-11 (×2): qty 2

## 2023-08-11 MED ORDER — BORTEZOMIB CHEMO SQ INJECTION 3.5 MG (2.5MG/ML)
1.3000 mg/m2 | Freq: Once | INTRAMUSCULAR | Status: AC
  Administered 2023-08-11: 2.25 mg via SUBCUTANEOUS
  Filled 2023-08-11: qty 0.9

## 2023-08-11 MED ORDER — DEXAMETHASONE 4 MG PO TABS
20.0000 mg | ORAL_TABLET | Freq: Once | ORAL | Status: AC
  Administered 2023-08-11: 20 mg via ORAL
  Filled 2023-08-11: qty 5

## 2023-08-11 MED ORDER — POTASSIUM CHLORIDE 20 MEQ/100ML IV SOLN
20.0000 meq | Freq: Once | INTRAVENOUS | Status: AC
  Administered 2023-08-11: 20 meq via INTRAVENOUS
  Filled 2023-08-11: qty 100

## 2023-08-11 NOTE — Progress Notes (Signed)
[facility] [address] [place] [address]    Clinic Day:  08/11/2023  Referring physician: [name], MD  Patient Care Team: [name], MD as PCP - General (Internal Medicine) [name], MD as Medical Oncologist (Hematology)   ASSESSMENT & PLAN:   Assessment: 1.  IgA kappa plasma cell myeloma, stage I: -RVD started on 01/09/2018, held since 02/14/2020 due to mandible abscess. -Myeloma labs on 05/14/2020 showed progression with M spike of 0.5 g. -RVD started back on 05/21/2020. -Myeloma labs on 07/10/2020 shows M spike improved to 0.3 g from 0.6 g previously.  Free light chain ratio is 1.74 with kappa light chains 28.4. -Myeloma panel from 08/14/2020 shows M spike 0.4 g.  Kappa light chains are 24.8 and ratio is 2.23. - Revlimid (20 mg 2 weeks on/1 week off) and Velcade (3 weeks on/1 week off) were held after 02/26/2022 due to weight loss. - Velcade maintenance every 2 weeks started on 04/13/2022.   2.  Osteomyelitis of the right mandible/dental abscess: -Finished IV ceftriaxone on 04/03/2020.  Finished oral antibiotics. -We will hold Xgeva indefinitely.    Plan: 1.  IgA kappa plasma cell myeloma, stage I: - She is tolerating Velcade every other week very well.  She also takes dexamethasone 20 mg on the days of injections. - Reviewed myeloma labs from 07/28/2023: M spike was negative.  Kappa light chains are 111.  Ratio is 6.24.  Immunofixation shows IgA kappa. - Her free light chain ratio and kappa light chains remain elevated but stable since January of this year.  Given her advanced age, I am not being aggressive in treatment of her myeloma.  We will continue Velcade maintenance every 2 weeks at this time as her labs are stable. - RTC 12 weeks for follow-up.   2.  Severe hypokalemia: - Continue liquid potassium 3 times daily.  Potassium today is low at 2.6.  She will receive supplementation.   3.  Osteomyelitis of the right mandible/dental  abscess: - Bisphosphonates held due to ONJ.   4.  Hypomagnesemia: - Continue magnesium 3 times daily.  Magnesium is 1.5 today.  She will receive magnesium supplements today.   5.  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  Hemoglobin stable between 10-11.    Orders Placed This Encounter  Procedures   Magnesium    Standing Status:   Future    Standing Expiration Date:   11/01/2024   CBC with Differential    Standing Status:   Future    Standing Expiration Date:   11/02/2024   Comprehensive metabolic panel    Standing Status:   Future    Standing Expiration Date:   11/02/2024   Magnesium    Standing Status:   Future    Standing Expiration Date:   11/15/2024   CBC with Differential    Standing Status:   Future    Standing Expiration Date:   11/16/2024   Comprehensive metabolic panel    Standing Status:   Future    Standing Expiration Date:   11/16/2024   Magnesium    Standing Status:   Future    Standing Expiration Date:   11/29/2024   CBC with Differential    Standing Status:   Future    Standing Expiration Date:   11/30/2024   Comprehensive metabolic panel    Standing Status:   Future    Standing Expiration Date:   11/30/2024   Magnesium  Standing Status:   Future    Standing Expiration Date:   12/13/2024   CBC with Differential    Standing Status:   Future    Standing Expiration Date:   12/14/2024   Comprehensive metabolic panel    Standing Status:   Future    Standing Expiration Date:   12/14/2024   Immunofixation electrophoresis    Standing Status:   Future    Standing Expiration Date:   08/10/2024   Protein electrophoresis, serum    Standing Status:   Future    Standing Expiration Date:   08/10/2024   Kappa/lambda light chains    Standing Status:   Future    Standing Expiration Date:   08/10/2024      Alben Deeds Teague,acting as a scribe for [name], MD.,have documented all relevant documentation on the behalf of  [name], MD,as directed by  [name], MD while in the presence of [name], MD.  I, [name] MD, have reviewed the above documentation for accuracy and completeness, and I agree with the above.    [name], MD   8/14/20246:03 PM  CHIEF COMPLAINT:   Diagnosis: multiple myeloma    Cancer Staging  No matching staging information was found for the patient.    Prior Therapy: RVD 01/09/18 through 02/14/20, 05/21/20 through 02/26/22   Current Therapy:  Maintenance Velcade every other week.    HISTORY OF PRESENT ILLNESS:   Oncology History  Multiple myeloma not having achieved remission (HCC)  01/20/2018 Initial Diagnosis   Multiple myeloma not having achieved remission (HCC)   01/26/2018 - 09/01/2022 Chemotherapy   Patient is on Treatment Plan : MYELOMA  RVD SQ (Bortezomib d 1,8,15 ) q28d x 4 cycles     04/13/2022 -  Chemotherapy   Patient is on Treatment Plan : MYELOMA MAINTENANCE Bortezomib SQ q14d        INTERVAL HISTORY:   Brittany Archer is a 82 y.o. female presenting to clinic today for follow up of multiple myeloma. She was last seen by me on 06/16/23.  Since her last visit, she presented to the ED on 06/16/23 for contusion of the face. CT head showed no acute injury.   Today, she states that she is doing well overall. Her appetite level is at 100%. Her energy level is at 75%. She is accompanied by her nephew.  She denies any medical issues. She denies any recent falls or infections, neuropathy in the hands and feet, nausea, diarrhea, constipation, or vomiting. She is taking Potassium and Magnesium as prescribed.   PAST MEDICAL HISTORY:   Past Medical History: Past Medical History:  Diagnosis Date   Breast cancer (HCC)    left breast/ 2008/ surg/ rad tx   Coronary artery disease    Diabetes mellitus     Surgical History: Past Surgical History:  Procedure Laterality Date   ABDOMINAL HYSTERECTOMY     BREAST SURGERY      DEBRIDEMENT MANDIBLE N/A 02/22/2020   Procedure: INCISION AND DRAINAGE WITH DEBRIDEMENT MANDIBLE;  Surgeon: [name], DMD;  Location: [facility];  Service: Oral Surgery;  Laterality: N/A;   DEBRIDEMENT MANDIBLE Right 03/19/2021   Procedure: DEBRIDEMENT OF BONE RIGHT INTERIOR  MANDIBLE;  Surgeon: [name], DMD;  Location: [facility] OR;  Service: Oral Surgery;  Laterality: Right;   EYE SURGERY  2021   cataract removals    TOOTH EXTRACTION N/A 02/22/2020   Procedure: DENTAL RESTORATION/EXTRACTIONS;  Surgeon: [name], DMD;  Location: [facility];  Service: Oral  Surgery;  Laterality: N/A;  DENTAL KIT REQUESTED    Social History: Social History   Socioeconomic History   Marital status: Divorced    Spouse name: Not on file   Number of children: Not on file   Years of education: Not on file   Highest education level: Not on file  Occupational History   Not on file  Tobacco Use   Smoking status: Never   Smokeless tobacco: Never  Vaping Use   Vaping status: Never Used  Substance and Sexual Activity   Alcohol use: No   Drug use: No   Sexual activity: Yes    Birth control/protection: Surgical  Other Topics Concern   Not on file  Social History Narrative   Not on file   Social Determinants of Health   Financial Resource Strain: Low Risk  (12/09/2020)   Overall Financial Resource Strain (CARDIA)    Difficulty of Paying Living Expenses: Not hard at all  Food Insecurity: No Food Insecurity (12/09/2020)   Hunger Vital Sign    Worried About Running Out of Food in the Last Year: Never true    Ran Out of Food in the Last Year: Never true  Transportation Needs: No Transportation Needs (12/09/2020)   PRAPARE - Administrator, Civil Service (Medical): No    Lack of Transportation (Non-Medical): No  Physical Activity: Inactive (12/09/2020)   Exercise Vital Sign    Days of Exercise per Week: 0 days    Minutes of Exercise per Session: 0 min  Stress: No Stress Concern Present  (12/09/2020)   [employer] of Occupational Health - Occupational Stress Questionnaire    Feeling of Stress : Not at all  Social Connections: Moderately Isolated (12/09/2020)   Social Connection and Isolation Panel [NHANES]    Frequency of Communication with Friends and Family: More than three times a week    Frequency of Social Gatherings with Friends and Family: More than three times a week    Attends Religious Services: More than 4 times per year    Active Member of [employer] or Organizations: No    Attends Banker Meetings: Never    Marital Status: Divorced  Catering manager Violence: Not At Risk (12/09/2020)   Humiliation, Afraid, Rape, and Kick questionnaire    Fear of Current or Ex-Partner: No    Emotionally Abused: No    Physically Abused: No    Sexually Abused: No    Family History: Family History  Problem Relation Age of Onset   Obesity Sister     Current Medications:  Current Outpatient Medications:    acyclovir (ZOVIRAX) 400 MG tablet, Take 400 mg by mouth 2 (two) times daily., Disp: , Rfl:    aspirin 81 MG tablet, Take 1 tablet (81 mg total) by mouth daily with breakfast., Disp: 30 tablet, Rfl: 5   atorvastatin (LIPITOR) 10 MG tablet, Take 10 mg by mouth daily., Disp: , Rfl:    bortezomib IV (VELCADE) 3.5 MG injection, 3.5 mg once a week. weekly, Disp: , Rfl:    chlorhexidine (PERIDEX) 0.12 % solution, Use as directed 15 mLs in the mouth or throat 3 (three) times daily., Disp: , Rfl:    cholecalciferol (VITAMIN D) 25 MCG (1000 UNIT) tablet, 1 capsule, Disp: , Rfl:    glucose blood (ACCU-CHEK AVIVA PLUS) test strip, CHECK BLOOD SUGAR ONCE DAILY, Disp: , Rfl:    GNP ASPIRIN LOW DOSE 81 MG tablet, Take 81 mg by mouth daily., Disp: , Rfl:  magnesium oxide (MAG-OX) 400 (240 Mg) MG tablet, TAKE ONE TABLET BY MOUTH THREE TIMES A DAY, Disp: 90 tablet, Rfl: 3   mirtazapine (REMERON) 15 MG tablet, Take 0.5 tablets (7.5 mg total) by mouth at bedtime. For  appetite stimulation, Disp: 30 tablet, Rfl: 2   ondansetron (ZOFRAN) 8 MG tablet, , Disp: , Rfl:    Potassium Acetate POWD, Take 20 mEq by mouth daily., Disp: 12000 g, Rfl: 5   potassium chloride (KLOR-CON) 20 MEQ packet, MIX AND TAKE ONE PACKET DAILY., Disp: 30 packet, Rfl: 3 No current facility-administered medications for this visit.  Facility-Administered Medications Ordered in Other Visits:    0.9 %  sodium chloride infusion, , Intravenous, Continuous, [name], MD, Stopped at 08/11/23 1610   Allergies: Allergies  Allergen Reactions   Other Other (See Comments)   Seasonal Ic [Cholestatin] Other (See Comments)    Sneezing, watery eyes   Motrin [Ibuprofen] Rash    REVIEW OF SYSTEMS:   Review of Systems  Constitutional:  Negative for chills, fatigue and fever.  HENT:   Negative for lump/mass, mouth sores, nosebleeds, sore throat and trouble swallowing.   Eyes:  Negative for eye problems.  Respiratory:  Negative for cough and shortness of breath.   Cardiovascular:  Negative for chest pain, leg swelling and palpitations.  Gastrointestinal:  Negative for abdominal pain, constipation, diarrhea, nausea and vomiting.  Genitourinary:  Negative for bladder incontinence, difficulty urinating, dysuria, frequency, hematuria and nocturia.   Musculoskeletal:  Negative for arthralgias, back pain, flank pain, myalgias and neck pain.  Skin:  Negative for itching and rash.  Neurological:  Negative for dizziness, headaches and numbness.  Hematological:  Does not bruise/bleed easily.  Psychiatric/Behavioral:  Negative for depression, sleep disturbance and suicidal ideas. The patient is not nervous/anxious.   All other systems reviewed and are negative.    VITALS:   Blood pressure (!) 176/72, pulse 78, temperature 98.2 F (36.8 C), temperature source Oral, resp. rate 18, weight 165 lb 12.8 oz (75.2 kg), SpO2 99%.  Wt Readings from Last 3 Encounters:  08/11/23 165 lb 12.8 oz (75.2  kg)  07/14/23 170 lb (77.1 kg)  06/16/23 174 lb (78.9 kg)    Body mass index is 30.33 kg/m.  Performance status (ECOG): 1 - Symptomatic but completely ambulatory  PHYSICAL EXAM:   Physical Exam Vitals and nursing note reviewed. Exam conducted with a chaperone present.  Constitutional:      Appearance: Normal appearance.  Cardiovascular:     Rate and Rhythm: Normal rate and regular rhythm.     Pulses: Normal pulses.     Heart sounds: Normal heart sounds.  Pulmonary:     Effort: Pulmonary effort is normal.     Breath sounds: Normal breath sounds.  Abdominal:     Palpations: Abdomen is soft. There is no hepatomegaly, splenomegaly or mass.     Tenderness: There is no abdominal tenderness.  Musculoskeletal:     Right lower leg: No edema.     Left lower leg: No edema.  Lymphadenopathy:     Cervical: No cervical adenopathy.     Right cervical: No superficial, deep or posterior cervical adenopathy.    Left cervical: No superficial, deep or posterior cervical adenopathy.     Upper Body:     Right upper body: No supraclavicular or axillary adenopathy.     Left upper body: No supraclavicular or axillary adenopathy.  Neurological:     General: No focal deficit present.     Mental  Status: She is alert and oriented to person, place, and time.  Psychiatric:        Mood and Affect: Mood normal.        Behavior: Behavior normal.     LABS:      Latest Ref Rng & Units 08/11/2023   12:31 PM 07/28/2023   12:14 PM 07/14/2023    7:56 AM  CBC  WBC 4.0 - 10.5 K/uL 3.5  4.8  4.4   Hemoglobin 12.0 - 15.0 g/dL 24.4  01.0  27.2   Hematocrit 36.0 - 46.0 % 31.8  33.0  34.2   Platelets 150 - 400 K/uL 184  169  179       Latest Ref Rng & Units 08/11/2023   12:31 PM 07/28/2023   12:14 PM 07/14/2023    7:56 AM  CMP  Glucose 70 - 99 mg/dL 536  644  034   BUN 8 - 23 mg/dL 11  18  17    Creatinine 0.44 - 1.00 mg/dL 7.42  5.95  6.38   Sodium 135 - 145 mmol/L 136  140  139   Potassium 3.5 - 5.1  mmol/L 2.6  3.5  3.6   Chloride 98 - 111 mmol/L 102  109  107   CO2 22 - 32 mmol/L 24  25  25    Calcium 8.9 - 10.3 mg/dL 8.5  9.1  9.2   Total Protein 6.5 - 8.1 g/dL 7.1  6.7  6.9   Total Bilirubin 0.3 - 1.2 mg/dL 0.7  0.7  1.1   Alkaline Phos 38 - 126 U/L 80  93  87   AST 15 - 41 U/L 16  16  12    ALT 0 - 44 U/L 10  15  10       No results found for: "CEA1", "CEA" / No results found for: "CEA1", "CEA" No results found for: "PSA1" No results found for: "CAN199" No results found for: "CAN125"  Lab Results  Component Value Date   TOTALPROTELP 6.2 07/28/2023   TOTALPROTELP 6.4 07/28/2023   ALBUMINELP 3.5 07/28/2023   A1GS 0.2 07/28/2023   A2GS 0.7 07/28/2023   BETS 1.2 07/28/2023   GAMS 0.7 07/28/2023   MSPIKE Not Observed 07/28/2023   SPEI Comment 07/28/2023   Lab Results  Component Value Date   TIBC 137 (L) 05/20/2023   TIBC 117 (L) 03/24/2023   TIBC 107 (L) 01/26/2023   FERRITIN 267 05/20/2023   FERRITIN 257 03/24/2023   FERRITIN 339 (H) 01/26/2023   IRONPCTSAT 33 (H) 05/20/2023   IRONPCTSAT 44 (H) 03/24/2023   IRONPCTSAT 50 (H) 01/26/2023   Lab Results  Component Value Date   LDH 137 05/20/2023   LDH 134 03/24/2023   LDH 125 01/26/2023     STUDIES:   No results found.

## 2023-08-11 NOTE — Patient Instructions (Addendum)
[facility] at [facility] Discharge Instructions   You were seen and examined today by Dr. Ellin Saba.  He reviewed the results of your lab work.   We will proceed with your treatment today.   Return as scheduled.    Thank you for choosing [facility] at [facility] to provide your oncology and hematology care.  To afford each patient quality time with our provider, please arrive at least 15 minutes before your scheduled appointment time.   If you have a lab appointment with the Cancer Center please come in thru the Main Entrance and check in at the main information desk.  You need to re-schedule your appointment should you arrive 10 or more minutes late.  We strive to give you quality time with our providers, and arriving late affects you and other patients whose appointments are after yours.  Also, if you no show three or more times for appointments you may be dismissed from the clinic at the providers discretion.     Again, thank you for choosing [facility].  Our hope is that these requests will decrease the amount of time that you wait before being seen by our physicians.       _____________________________________________________________  Should you have questions after your visit to [facility], please contact our office at [phone] and follow the prompts.  Our office hours are 8:00 a.m. and 4:30 p.m. Monday - Friday.  Please note that voicemails left after 4:00 p.m. may not be returned until the following business day.  We are closed weekends and major holidays.  You do have access to a nurse 24-7, just call the main number to the clinic [phone] and do not press any options, hold on the line and a nurse will answer the phone.    For prescription refill requests, have your pharmacy contact our office and allow 72 hours.    Due to Covid, you will need to wear a mask upon entering the hospital. If you do  not have a mask, a mask will be given to you at the Main Entrance upon arrival. For doctor visits, patients may have 1 support person age 83 or older with them. For treatment visits, patients can not have anyone with them due to social distancing guidelines and our immunocompromised population.

## 2023-08-11 NOTE — Progress Notes (Signed)
Patient presents today for Velcade injection per providers order.  Vital signs and labs reviewed by MD.  Message received from Chapman Moss RN/Dr. Ellin Saba okay to proceed with injection no need to wait for CMP.  Potassium noted to be 2.6 and Magnesium noted to be 1.5, MD notified and patient to receive Potassium 40 mEq PO every 2 hours for 2 doses and 2 Grams IV Magnesium Sulfate per providers order.  Stable during infusion without adverse affects.  Vital signs stable. Velcade administration without incident; injection site WNL; see MAR for injection details.  Patient tolerated procedure well and without incident.  No questions or complaints noted at this time. Discharge from clinic ambulatory in stable condition.  Alert and oriented X 3.  Follow up with [facility] as scheduled.

## 2023-08-11 NOTE — Patient Instructions (Signed)
[facility]  Discharge Instructions: Thank you for choosing [facility] to provide your oncology and hematology care.  If you have a lab appointment with the Cancer Center - please note that after April 8th, 2024, all labs will be drawn in the cancer center.  You do not have to check in or register with the main entrance as you have in the past but will complete your check-in in the cancer center.  Wear comfortable clothing and clothing appropriate for easy access to any Portacath or PICC line.   We strive to give you quality time with your provider. You may need to reschedule your appointment if you arrive late (15 or more minutes).  Arriving late affects you and other patients whose appointments are after yours.  Also, if you miss three or more appointments without notifying the office, you may be dismissed from the clinic at the provider's discretion.      For prescription refill requests, have your pharmacy contact our office and allow 72 hours for refills to be completed.    Today you received the following chemotherapy and/or immunotherapy agents Velcade, Potassium, Magnesium      To help prevent nausea and vomiting after your treatment, we encourage you to take your nausea medication as directed.  BELOW ARE SYMPTOMS THAT SHOULD BE REPORTED IMMEDIATELY: *FEVER GREATER THAN 100.4 F (38 C) OR HIGHER *CHILLS OR SWEATING *NAUSEA AND VOMITING THAT IS NOT CONTROLLED WITH YOUR NAUSEA MEDICATION *UNUSUAL SHORTNESS OF BREATH *UNUSUAL BRUISING OR BLEEDING *URINARY PROBLEMS (pain or burning when urinating, or frequent urination) *BOWEL PROBLEMS (unusual diarrhea, constipation, pain near the anus) TENDERNESS IN MOUTH AND THROAT WITH OR WITHOUT PRESENCE OF ULCERS (sore throat, sores in mouth, or a toothache) UNUSUAL RASH, SWELLING OR PAIN  UNUSUAL VAGINAL DISCHARGE OR ITCHING   Items with * indicate a potential emergency and should be followed up as soon as  possible or go to the Emergency Department if any problems should occur.  Please show the CHEMOTHERAPY ALERT CARD or IMMUNOTHERAPY ALERT CARD at check-in to the Emergency Department and triage nurse.  Should you have questions after your visit or need to cancel or reschedule your appointment, please contact [facility] [phone]  and follow the prompts.  Office hours are 8:00 a.m. to 4:30 p.m. Monday - Friday. Please note that voicemails left after 4:00 p.m. may not be returned until the following business day.  We are closed weekends and major holidays. You have access to a nurse at all times for urgent questions. Please call the main number to the clinic [phone] and follow the prompts.  For any non-urgent questions, you may also contact your provider using MyChart. We now offer e-Visits for anyone 86 and older to request care online for non-urgent symptoms. For details visit [email or web address].   Also download the MyChart app! Go to the app store, search "MyChart", open the app, select [place], and log in with your MyChart username and password.

## 2023-08-11 NOTE — Progress Notes (Signed)
CRITICAL VALUE STICKER  CRITICAL VALUE: K+ 2.6  RECEIVER (on-site recipient of call): Cynda Acres, RN  DATE & TIME NOTIFIED: 08/11/23 @1333   MESSENGER (representative from lab):Joseal  MD NOTIFIED: Dr. Ellin Saba  TIME OF NOTIFICATION: 1333  RESPONSE:  K+ 20 meq will be given IV per standing orders

## 2023-08-11 NOTE — Progress Notes (Signed)
Ok to proceed without CMET reported out.  V.O. Dr Carilyn Goodpasture, PharmD

## 2023-08-12 ENCOUNTER — Other Ambulatory Visit: Payer: Self-pay

## 2023-08-23 ENCOUNTER — Other Ambulatory Visit: Payer: Self-pay

## 2023-08-25 ENCOUNTER — Inpatient Hospital Stay: Payer: Medicare HMO

## 2023-08-25 VITALS — BP 188/74 | HR 77 | Temp 97.7°F | Resp 18 | Wt 170.4 lb

## 2023-08-25 DIAGNOSIS — C9000 Multiple myeloma not having achieved remission: Secondary | ICD-10-CM

## 2023-08-25 DIAGNOSIS — Z5112 Encounter for antineoplastic immunotherapy: Secondary | ICD-10-CM | POA: Diagnosis not present

## 2023-08-25 LAB — COMPREHENSIVE METABOLIC PANEL
ALT: 12 U/L (ref 0–44)
AST: 15 U/L (ref 15–41)
Albumin: 3.4 g/dL — ABNORMAL LOW (ref 3.5–5.0)
Alkaline Phosphatase: 88 U/L (ref 38–126)
Anion gap: 6 (ref 5–15)
BUN: 8 mg/dL (ref 8–23)
CO2: 27 mmol/L (ref 22–32)
Calcium: 8.7 mg/dL — ABNORMAL LOW (ref 8.9–10.3)
Chloride: 105 mmol/L (ref 98–111)
Creatinine, Ser: 1 mg/dL (ref 0.44–1.00)
GFR, Estimated: 56 mL/min — ABNORMAL LOW (ref >60)
Glucose, Bld: 236 mg/dL — ABNORMAL HIGH (ref 70–99)
Potassium: 3.3 mmol/L — ABNORMAL LOW (ref 3.5–5.1)
Sodium: 138 mmol/L (ref 135–145)
Total Bilirubin: 0.9 mg/dL (ref 0.3–1.2)
Total Protein: 6.5 g/dL (ref 6.5–8.1)

## 2023-08-25 LAB — CBC WITH DIFFERENTIAL/PLATELET
Abs Immature Granulocytes: 0.01 10*3/uL (ref 0.00–0.07)
Basophils Absolute: 0 10*3/uL (ref 0.0–0.1)
Basophils Relative: 1 %
Eosinophils Absolute: 0.1 10*3/uL (ref 0.0–0.5)
Eosinophils Relative: 1 %
HCT: 32.6 % — ABNORMAL LOW (ref 36.0–46.0)
Hemoglobin: 10.7 g/dL — ABNORMAL LOW (ref 12.0–15.0)
Immature Granulocytes: 0 %
Lymphocytes Relative: 26 %
Lymphs Abs: 1.3 10*3/uL (ref 0.7–4.0)
MCH: 33.8 pg (ref 26.0–34.0)
MCHC: 32.8 g/dL (ref 30.0–36.0)
MCV: 102.8 fL — ABNORMAL HIGH (ref 80.0–100.0)
Monocytes Absolute: 0.5 10*3/uL (ref 0.1–1.0)
Monocytes Relative: 9 %
Neutro Abs: 3.1 10*3/uL (ref 1.7–7.7)
Neutrophils Relative %: 63 %
Platelets: 161 10*3/uL (ref 150–400)
RBC: 3.17 MIL/uL — ABNORMAL LOW (ref 3.87–5.11)
RDW: 13.5 % (ref 11.5–15.5)
WBC: 4.9 10*3/uL (ref 4.0–10.5)
nRBC: 0 % (ref 0.0–0.2)

## 2023-08-25 LAB — MAGNESIUM: Magnesium: 1.4 mg/dL — ABNORMAL LOW (ref 1.7–2.4)

## 2023-08-25 MED ORDER — BORTEZOMIB CHEMO SQ INJECTION 3.5 MG (2.5MG/ML)
1.3000 mg/m2 | Freq: Once | INTRAMUSCULAR | Status: AC
  Administered 2023-08-25: 2.25 mg via SUBCUTANEOUS
  Filled 2023-08-25: qty 0.9

## 2023-08-25 MED ORDER — POTASSIUM CHLORIDE CRYS ER 20 MEQ PO TBCR
40.0000 meq | EXTENDED_RELEASE_TABLET | Freq: Once | ORAL | Status: AC
  Administered 2023-08-25: 40 meq via ORAL
  Filled 2023-08-25: qty 2

## 2023-08-25 MED ORDER — PROCHLORPERAZINE MALEATE 10 MG PO TABS
10.0000 mg | ORAL_TABLET | Freq: Once | ORAL | Status: AC
  Administered 2023-08-25: 10 mg via ORAL
  Filled 2023-08-25: qty 1

## 2023-08-25 MED ORDER — SODIUM CHLORIDE 0.9 % IV SOLN: INTRAVENOUS | Status: DC

## 2023-08-25 MED ORDER — DEXAMETHASONE 4 MG PO TABS
20.0000 mg | ORAL_TABLET | Freq: Once | ORAL | Status: AC
  Administered 2023-08-25: 20 mg via ORAL
  Filled 2023-08-25: qty 5

## 2023-08-25 MED ORDER — MAGNESIUM SULFATE 4 GM/100ML IV SOLN
4.0000 g | Freq: Once | INTRAVENOUS | Status: AC
  Administered 2023-08-25: 4 g via INTRAVENOUS
  Filled 2023-08-25: qty 100

## 2023-08-25 NOTE — Patient Instructions (Signed)
[facility]  Discharge Instructions: Thank you for choosing [facility] to provide your oncology and hematology care.  If you have a lab appointment with the Cancer Center - please note that after April 8th, 2024, all labs will be drawn in the cancer center.  You do not have to check in or register with the main entrance as you have in the past but will complete your check-in in the cancer center.  Wear comfortable clothing and clothing appropriate for easy access to any Portacath or PICC line.   We strive to give you quality time with your provider. You may need to reschedule your appointment if you arrive late (15 or more minutes).  Arriving late affects you and other patients whose appointments are after yours.  Also, if you miss three or more appointments without notifying the office, you may be dismissed from the clinic at the provider's discretion.      For prescription refill requests, have your pharmacy contact our office and allow 72 hours for refills to be completed.    Today you received the following chemotherapy and/or immunotherapy agents Velcade   To help prevent nausea and vomiting after your treatment, we encourage you to take your nausea medication as directed.  Bortezomib Injection What is this medication? BORTEZOMIB (bor TEZ oh mib) treats lymphoma. It may also be used to treat multiple myeloma, a type of bone marrow cancer. It works by blocking a protein that causes cancer cells to grow and multiply. This helps to slow or stop the spread of cancer cells. This medicine may be used for other purposes; ask your health care provider or pharmacist if you have questions. COMMON BRAND NAME(S): Velcade What should I tell my care team before I take this medication? They need to know if you have any of these conditions: Dehydration Diabetes Heart disease Liver disease Tingling of the fingers or toes or other nerve disorder An unusual or  allergic reaction to bortezomib, other medications, foods, dyes, or preservatives If you or your partner are pregnant or trying to get pregnant Breastfeeding How should I use this medication? This medication is injected into a vein or under the skin. It is given by your care team in a hospital or clinic setting. Talk to your care team about the use of this medication in children. Special care may be needed. Overdosage: If you think you have taken too much of this medicine contact a poison control center or emergency room at once. NOTE: This medicine is only for you. Do not share this medicine with others. What if I miss a dose? Keep appointments for follow-up doses. It is important not to miss your dose. Call your care team if you are unable to keep an appointment. What may interact with this medication? Ketoconazole Rifampin This list may not describe all possible interactions. Give your health care provider a list of all the medicines, herbs, non-prescription drugs, or dietary supplements you use. Also tell them if you smoke, drink alcohol, or use illegal drugs. Some items may interact with your medicine. What should I watch for while using this medication? Your condition will be monitored carefully while you are receiving this medication. You may need blood work while taking this medication. This medication may affect your coordination, reaction time, or judgment. Do not drive or operate machinery until you know how this medication affects you. Sit up or stand slowly to reduce the risk of dizzy or fainting spells. Drinking alcohol  with this medication can increase the risk of these side effects. This medication may increase your risk of getting an infection. Call your care team for advice if you get a fever, chills, sore throat, or other symptoms of a cold or flu. Do not treat yourself. Try to avoid being around people who are sick. Check with your care team if you have severe diarrhea, nausea,  and vomiting, or if you sweat a lot. The loss of too much body fluid may make it dangerous for you to take this medication. Talk to your care team if you may be pregnant. Serious birth defects can occur if you take this medication during pregnancy and for 7 months after the last dose. You will need a negative pregnancy test before starting this medication. Contraception is recommended while taking this medication and for 7 months after the last dose. Your care team can help you find the option that works for you. If your partner can get pregnant, use a condom during sex while taking this medication and for 4 months after the last dose. Do not breastfeed while taking this medication and for 2 months after the last dose. This medication may cause infertility. Talk to your care team if you are concerned about your fertility. What side effects may I notice from receiving this medication? Side effects that you should report to your care team as soon as possible: Allergic reactions--skin rash, itching, hives, swelling of the face, lips, tongue, or throat Bleeding--bloody or black, tar-like stools, vomiting blood or brown material that looks like coffee grounds, red or dark brown urine, small red or purple spots on skin, unusual bruising or bleeding Bleeding in the brain--severe headache, stiff neck, confusion, dizziness, change in vision, numbness or weakness of the face, arm, or leg, trouble speaking, trouble walking, vomiting Bowel blockage--stomach cramping, unable to have a bowel movement or pass gas, loss of appetite, vomiting Heart failure--shortness of breath, swelling of the ankles, feet, or hands, sudden weight gain, unusual weakness or fatigue Infection--fever, chills, cough, sore throat, wounds that don't heal, pain or trouble when passing urine, general feeling of discomfort or being unwell Liver injury--right upper belly pain, loss of appetite, nausea, light-colored stool, dark yellow or brown  urine, yellowing skin or eyes, unusual weakness or fatigue Low blood pressure--dizziness, feeling faint or lightheaded, blurry vision Lung injury--shortness of breath or trouble breathing, cough, spitting up blood, chest pain, fever Pain, tingling, or numbness in the hands or feet Severe or prolonged diarrhea Stomach pain, bloody diarrhea, pale skin, unusual weakness or fatigue, decrease in the amount of urine, which may be signs of hemolytic uremic syndrome Sudden and severe headache, confusion, change in vision, seizures, which may be signs of posterior reversible encephalopathy syndrome (PRES) TTP--purple spots on the skin or inside the mouth, pale skin, yellowing skin or eyes, unusual weakness or fatigue, fever, fast or irregular heartbeat, confusion, change in vision, trouble speaking, trouble walking Tumor lysis syndrome (TLS)--nausea, vomiting, diarrhea, decrease in the amount of urine, dark urine, unusual weakness or fatigue, confusion, muscle pain or cramps, fast or irregular heartbeat, joint pain Side effects that usually do not require medical attention (report to your care team if they continue or are bothersome): Constipation Diarrhea Fatigue Loss of appetite Nausea This list may not describe all possible side effects. Call your doctor for medical advice about side effects. You may report side effects to FDA at 1-800-FDA-1088. Where should I keep my medication? This medication is given in a hospital or  clinic. It will not be stored at home. NOTE: This sheet is a summary. It may not cover all possible information. If you have questions about this medicine, talk to your doctor, pharmacist, or health care provider.  2024 Elsevier/Gold Standard (2022-05-19 00:00:00)   BELOW ARE SYMPTOMS THAT SHOULD BE REPORTED IMMEDIATELY: *FEVER GREATER THAN 100.4 F (38 C) OR HIGHER *CHILLS OR SWEATING *NAUSEA AND VOMITING THAT IS NOT CONTROLLED WITH YOUR NAUSEA MEDICATION *UNUSUAL SHORTNESS OF  BREATH *UNUSUAL BRUISING OR BLEEDING *URINARY PROBLEMS (pain or burning when urinating, or frequent urination) *BOWEL PROBLEMS (unusual diarrhea, constipation, pain near the anus) TENDERNESS IN MOUTH AND THROAT WITH OR WITHOUT PRESENCE OF ULCERS (sore throat, sores in mouth, or a toothache) UNUSUAL RASH, SWELLING OR PAIN  UNUSUAL VAGINAL DISCHARGE OR ITCHING   Items with * indicate a potential emergency and should be followed up as soon as possible or go to the Emergency Department if any problems should occur.  Please show the CHEMOTHERAPY ALERT CARD or IMMUNOTHERAPY ALERT CARD at check-in to the Emergency Department and triage nurse.  Should you have questions after your visit or need to cancel or reschedule your appointment, please contact [facility] [phone]  and follow the prompts.  Office hours are 8:00 a.m. to 4:30 p.m. Monday - Friday. Please note that voicemails left after 4:00 p.m. may not be returned until the following business day.  We are closed weekends and major holidays. You have access to a nurse at all times for urgent questions. Please call the main number to the clinic [phone] and follow the prompts.  For any non-urgent questions, you may also contact your provider using MyChart. We now offer e-Visits for anyone 18 and older to request care online for non-urgent symptoms. For details visit [email or web address].   Also download the MyChart app! Go to the app store, search "MyChart", open the app, select [place], and log in with your MyChart username and password.

## 2023-08-25 NOTE — Progress Notes (Signed)
Patient presents today for Velcade infusion.  Patient is in satisfactory condition with no new complaints voiced.  Vital signs are stable.  Labs reviewed and all labs are within treatment parameters. No retacrit needed today due to 10.7 hemoglobin. We will proceed with treatment per MD orders.    Pt's magnesium is 1.4 and potassium is 3.3 today. Pt will receive 4g IV magnesium sulfate and 40 mEq potassium chloride p.o x 1 dose per Dr.K's standing orders.  Treatment given today per MD orders. Tolerated infusion without adverse affects. Vital signs stable. No complaints at this time. Discharged from clinic ambulatory in stable condition. Alert and oriented x 3. F/U with [facility] as scheduled.

## 2023-08-30 ENCOUNTER — Other Ambulatory Visit: Payer: Self-pay | Admitting: Hematology

## 2023-08-31 ENCOUNTER — Encounter (HOSPITAL_COMMUNITY): Payer: Self-pay | Admitting: Hematology

## 2023-08-31 ENCOUNTER — Encounter: Payer: Self-pay | Admitting: Hematology

## 2023-09-03 ENCOUNTER — Telehealth: Payer: Self-pay

## 2023-09-03 NOTE — Telephone Encounter (Signed)
Notified Patient of prior authorization approval for Potassium Chloride Packets. Medication is approved through 12/28/2023. No other needs or concerns voiced at this time.

## 2023-09-08 ENCOUNTER — Inpatient Hospital Stay: Payer: Medicare HMO | Attending: Hematology

## 2023-09-08 ENCOUNTER — Inpatient Hospital Stay: Payer: Medicare HMO

## 2023-09-08 VITALS — BP 170/79 | HR 73 | Temp 97.3°F | Resp 20 | Wt 166.7 lb

## 2023-09-08 DIAGNOSIS — C9000 Multiple myeloma not having achieved remission: Secondary | ICD-10-CM | POA: Diagnosis present

## 2023-09-08 DIAGNOSIS — Z5112 Encounter for antineoplastic immunotherapy: Secondary | ICD-10-CM | POA: Diagnosis present

## 2023-09-08 LAB — CBC WITH DIFFERENTIAL/PLATELET
Abs Immature Granulocytes: 0.01 10*3/uL (ref 0.00–0.07)
Basophils Absolute: 0 10*3/uL (ref 0.0–0.1)
Basophils Relative: 1 %
Eosinophils Absolute: 0.1 10*3/uL (ref 0.0–0.5)
Eosinophils Relative: 1 %
HCT: 33.1 % — ABNORMAL LOW (ref 36.0–46.0)
Hemoglobin: 10.9 g/dL — ABNORMAL LOW (ref 12.0–15.0)
Immature Granulocytes: 0 %
Lymphocytes Relative: 24 %
Lymphs Abs: 1.6 10*3/uL (ref 0.7–4.0)
MCH: 34 pg (ref 26.0–34.0)
MCHC: 32.9 g/dL (ref 30.0–36.0)
MCV: 103.1 fL — ABNORMAL HIGH (ref 80.0–100.0)
Monocytes Absolute: 0.5 10*3/uL (ref 0.1–1.0)
Monocytes Relative: 7 %
Neutro Abs: 4.4 10*3/uL (ref 1.7–7.7)
Neutrophils Relative %: 67 %
Platelets: 195 10*3/uL (ref 150–400)
RBC: 3.21 MIL/uL — ABNORMAL LOW (ref 3.87–5.11)
RDW: 13.6 % (ref 11.5–15.5)
WBC: 6.5 10*3/uL (ref 4.0–10.5)
nRBC: 0 % (ref 0.0–0.2)

## 2023-09-08 LAB — COMPREHENSIVE METABOLIC PANEL
ALT: 11 U/L (ref 0–44)
AST: 14 U/L — ABNORMAL LOW (ref 15–41)
Albumin: 3.5 g/dL (ref 3.5–5.0)
Alkaline Phosphatase: 93 U/L (ref 38–126)
Anion gap: 9 (ref 5–15)
BUN: 19 mg/dL (ref 8–23)
CO2: 26 mmol/L (ref 22–32)
Calcium: 9.2 mg/dL (ref 8.9–10.3)
Chloride: 101 mmol/L (ref 98–111)
Creatinine, Ser: 1.17 mg/dL — ABNORMAL HIGH (ref 0.44–1.00)
GFR, Estimated: 46 mL/min — ABNORMAL LOW (ref >60)
Glucose, Bld: 196 mg/dL — ABNORMAL HIGH (ref 70–99)
Potassium: 3.7 mmol/L (ref 3.5–5.1)
Sodium: 136 mmol/L (ref 135–145)
Total Bilirubin: 0.7 mg/dL (ref 0.3–1.2)
Total Protein: 7.2 g/dL (ref 6.5–8.1)

## 2023-09-08 LAB — MAGNESIUM: Magnesium: 1.6 mg/dL — ABNORMAL LOW (ref 1.7–2.4)

## 2023-09-08 MED ORDER — BORTEZOMIB CHEMO SQ INJECTION 3.5 MG (2.5MG/ML)
1.3000 mg/m2 | Freq: Once | INTRAMUSCULAR | Status: AC
  Administered 2023-09-08: 2.25 mg via SUBCUTANEOUS
  Filled 2023-09-08: qty 0.9

## 2023-09-08 MED ORDER — DEXAMETHASONE 4 MG PO TABS
20.0000 mg | ORAL_TABLET | Freq: Once | ORAL | Status: AC
  Administered 2023-09-08: 20 mg via ORAL
  Filled 2023-09-08: qty 5

## 2023-09-08 MED ORDER — PROCHLORPERAZINE MALEATE 10 MG PO TABS
10.0000 mg | ORAL_TABLET | Freq: Once | ORAL | Status: AC
  Administered 2023-09-08: 10 mg via ORAL
  Filled 2023-09-08: qty 1

## 2023-09-08 NOTE — Patient Instructions (Signed)
[facility] [place]  Discharge Instructions: Thank you for choosing [facility] to provide your oncology and hematology care.  If you have a lab appointment with the Cancer Center - please note that after April 8th, 2024, all labs will be drawn in the cancer center.  You do not have to check in or register with the main entrance as you have in the past but will complete your check-in in the cancer center.  Wear comfortable clothing and clothing appropriate for easy access to any Portacath or PICC line.   We strive to give you quality time with your provider. You may need to reschedule your appointment if you arrive late (15 or more minutes).  Arriving late affects you and other patients whose appointments are after yours.  Also, if you miss three or more appointments without notifying the office, you may be dismissed from the clinic at the provider's discretion.      For prescription refill requests, have your pharmacy contact our office and allow 72 hours for refills to be completed.    Today you received the following chemotherapy and/or immunotherapy agents velcade    To help prevent nausea and vomiting after your treatment, we encourage you to take your nausea medication as directed.  BELOW ARE SYMPTOMS THAT SHOULD BE REPORTED IMMEDIATELY: *FEVER GREATER THAN 100.4 F (38 C) OR HIGHER *CHILLS OR SWEATING *NAUSEA AND VOMITING THAT IS NOT CONTROLLED WITH YOUR NAUSEA MEDICATION *UNUSUAL SHORTNESS OF BREATH *UNUSUAL BRUISING OR BLEEDING *URINARY PROBLEMS (pain or burning when urinating, or frequent urination) *BOWEL PROBLEMS (unusual diarrhea, constipation, pain near the anus) TENDERNESS IN MOUTH AND THROAT WITH OR WITHOUT PRESENCE OF ULCERS (sore throat, sores in mouth, or a toothache) UNUSUAL RASH, SWELLING OR PAIN  UNUSUAL VAGINAL DISCHARGE OR ITCHING   Items with * indicate a potential emergency and should be followed up as soon as possible or go to the  Emergency Department if any problems should occur.  Please show the CHEMOTHERAPY ALERT CARD or IMMUNOTHERAPY ALERT CARD at check-in to the Emergency Department and triage nurse.  Should you have questions after your visit or need to cancel or reschedule your appointment, please contact [facility] [place] [phone]  and follow the prompts.  Office hours are 8:00 a.m. to 4:30 p.m. Monday - Friday. Please note that voicemails left after 4:00 p.m. may not be returned until the following business day.  We are closed weekends and major holidays. You have access to a nurse at all times for urgent questions. Please call the main number to the clinic [phone] and follow the prompts.  For any non-urgent questions, you may also contact your provider using MyChart. We now offer e-Visits for anyone 18 and older to request care online for non-urgent symptoms. For details visit [email or web address].   Also download the MyChart app! Go to the app store, search "MyChart", open the app, select [place], and log in with your MyChart username and password.   

## 2023-09-08 NOTE — Progress Notes (Signed)
Magnesium 1.6 today.  Patient stated she is taking her magnesium as directed.  Reviewed labs and patient declined the magnesium infusion.    Patient tolerated Velcade injection with no complaints voiced.  Lab work reviewed.  See MAR for details.  Injection site clean and dry with no bruising or swelling noted.  Patient stable during and after injection.  Band aid applied.  VSS.  Patient left in satisfactory condition with no s/s of distress noted.

## 2023-09-22 ENCOUNTER — Inpatient Hospital Stay: Payer: Medicare HMO

## 2023-09-22 VITALS — BP 164/75 | HR 65 | Temp 97.9°F | Resp 18

## 2023-09-22 DIAGNOSIS — C9000 Multiple myeloma not having achieved remission: Secondary | ICD-10-CM

## 2023-09-22 DIAGNOSIS — Z5112 Encounter for antineoplastic immunotherapy: Secondary | ICD-10-CM | POA: Diagnosis not present

## 2023-09-22 LAB — CBC WITH DIFFERENTIAL/PLATELET
Abs Immature Granulocytes: 0.02 10*3/uL (ref 0.00–0.07)
Basophils Absolute: 0 10*3/uL (ref 0.0–0.1)
Basophils Relative: 1 %
Eosinophils Absolute: 0.1 10*3/uL (ref 0.0–0.5)
Eosinophils Relative: 2 %
HCT: 34.6 % — ABNORMAL LOW (ref 36.0–46.0)
Hemoglobin: 11.3 g/dL — ABNORMAL LOW (ref 12.0–15.0)
Immature Granulocytes: 0 %
Lymphocytes Relative: 23 %
Lymphs Abs: 1.4 10*3/uL (ref 0.7–4.0)
MCH: 33.4 pg (ref 26.0–34.0)
MCHC: 32.7 g/dL (ref 30.0–36.0)
MCV: 102.4 fL — ABNORMAL HIGH (ref 80.0–100.0)
Monocytes Absolute: 0.4 10*3/uL (ref 0.1–1.0)
Monocytes Relative: 7 %
Neutro Abs: 4.1 10*3/uL (ref 1.7–7.7)
Neutrophils Relative %: 67 %
Platelets: 191 10*3/uL (ref 150–400)
RBC: 3.38 MIL/uL — ABNORMAL LOW (ref 3.87–5.11)
RDW: 13.7 % (ref 11.5–15.5)
WBC: 6 10*3/uL (ref 4.0–10.5)
nRBC: 0 % (ref 0.0–0.2)

## 2023-09-22 LAB — COMPREHENSIVE METABOLIC PANEL
ALT: 12 U/L (ref 0–44)
AST: 14 U/L — ABNORMAL LOW (ref 15–41)
Albumin: 3.6 g/dL (ref 3.5–5.0)
Alkaline Phosphatase: 88 U/L (ref 38–126)
Anion gap: 9 (ref 5–15)
BUN: 16 mg/dL (ref 8–23)
CO2: 26 mmol/L (ref 22–32)
Calcium: 9.5 mg/dL (ref 8.9–10.3)
Chloride: 104 mmol/L (ref 98–111)
Creatinine, Ser: 1.16 mg/dL — ABNORMAL HIGH (ref 0.44–1.00)
GFR, Estimated: 47 mL/min — ABNORMAL LOW (ref >60)
Glucose, Bld: 158 mg/dL — ABNORMAL HIGH (ref 70–99)
Potassium: 4.1 mmol/L (ref 3.5–5.1)
Sodium: 139 mmol/L (ref 135–145)
Total Bilirubin: 1.1 mg/dL (ref 0.3–1.2)
Total Protein: 7.2 g/dL (ref 6.5–8.1)

## 2023-09-22 LAB — MAGNESIUM: Magnesium: 1.9 mg/dL (ref 1.7–2.4)

## 2023-09-22 MED ORDER — BORTEZOMIB CHEMO SQ INJECTION 3.5 MG (2.5MG/ML)
1.3000 mg/m2 | Freq: Once | INTRAMUSCULAR | Status: AC
  Administered 2023-09-22: 2.25 mg via SUBCUTANEOUS
  Filled 2023-09-22: qty 0.9

## 2023-09-22 MED ORDER — DEXAMETHASONE 4 MG PO TABS
20.0000 mg | ORAL_TABLET | Freq: Once | ORAL | Status: AC
  Administered 2023-09-22: 20 mg via ORAL
  Filled 2023-09-22: qty 5

## 2023-09-22 MED ORDER — PROCHLORPERAZINE MALEATE 10 MG PO TABS
10.0000 mg | ORAL_TABLET | Freq: Once | ORAL | Status: AC
  Administered 2023-09-22: 10 mg via ORAL
  Filled 2023-09-22: qty 1

## 2023-09-22 NOTE — Patient Instructions (Signed)
[facility]  Discharge Instructions: Thank you for choosing [facility] to provide your oncology and hematology care.  If you have a lab appointment with the Cancer Center - please note that after April 8th, 2024, all labs will be drawn in the cancer center.  You do not have to check in or register with the main entrance as you have in the past but will complete your check-in in the cancer center.  Wear comfortable clothing and clothing appropriate for easy access to any Portacath or PICC line.   We strive to give you quality time with your provider. You may need to reschedule your appointment if you arrive late (15 or more minutes).  Arriving late affects you and other patients whose appointments are after yours.  Also, if you miss three or more appointments without notifying the office, you may be dismissed from the clinic at the provider's discretion.      For prescription refill requests, have your pharmacy contact our office and allow 72 hours for refills to be completed.    Today you received the following chemotherapy and/or immunotherapy agents Velcade.  Bortezomib Injection What is this medication? BORTEZOMIB (bor TEZ oh mib) treats lymphoma. It may also be used to treat multiple myeloma, a type of bone marrow cancer. It works by blocking a protein that causes cancer cells to grow and multiply. This helps to slow or stop the spread of cancer cells. This medicine may be used for other purposes; ask your health care provider or pharmacist if you have questions. COMMON BRAND NAME(S): Velcade What should I tell my care team before I take this medication? They need to know if you have any of these conditions: Dehydration Diabetes Heart disease Liver disease Tingling of the fingers or toes or other nerve disorder An unusual or allergic reaction to bortezomib, other medications, foods, dyes, or preservatives If you or your partner are pregnant or trying  to get pregnant Breastfeeding How should I use this medication? This medication is injected into a vein or under the skin. It is given by your care team in a hospital or clinic setting. Talk to your care team about the use of this medication in children. Special care may be needed. Overdosage: If you think you have taken too much of this medicine contact a poison control center or emergency room at once. NOTE: This medicine is only for you. Do not share this medicine with others. What if I miss a dose? Keep appointments for follow-up doses. It is important not to miss your dose. Call your care team if you are unable to keep an appointment. What may interact with this medication? Ketoconazole Rifampin This list may not describe all possible interactions. Give your health care provider a list of all the medicines, herbs, non-prescription drugs, or dietary supplements you use. Also tell them if you smoke, drink alcohol, or use illegal drugs. Some items may interact with your medicine. What should I watch for while using this medication? Your condition will be monitored carefully while you are receiving this medication. You may need blood work while taking this medication. This medication may affect your coordination, reaction time, or judgment. Do not drive or operate machinery until you know how this medication affects you. Sit up or stand slowly to reduce the risk of dizzy or fainting spells. Drinking alcohol with this medication can increase the risk of these side effects. This medication may increase your risk of getting an infection.  Call your care team for advice if you get a fever, chills, sore throat, or other symptoms of a cold or flu. Do not treat yourself. Try to avoid being around people who are sick. Check with your care team if you have severe diarrhea, nausea, and vomiting, or if you sweat a lot. The loss of too much body fluid may make it dangerous for you to take this medication. Talk  to your care team if you may be pregnant. Serious birth defects can occur if you take this medication during pregnancy and for 7 months after the last dose. You will need a negative pregnancy test before starting this medication. Contraception is recommended while taking this medication and for 7 months after the last dose. Your care team can help you find the option that works for you. If your partner can get pregnant, use a condom during sex while taking this medication and for 4 months after the last dose. Do not breastfeed while taking this medication and for 2 months after the last dose. This medication may cause infertility. Talk to your care team if you are concerned about your fertility. What side effects may I notice from receiving this medication? Side effects that you should report to your care team as soon as possible: Allergic reactions--skin rash, itching, hives, swelling of the face, lips, tongue, or throat Bleeding--bloody or black, tar-like stools, vomiting blood or Fawnda Vitullo material that looks like coffee grounds, red or dark Trice Aspinall urine, small red or purple spots on skin, unusual bruising or bleeding Bleeding in the brain--severe headache, stiff neck, confusion, dizziness, change in vision, numbness or weakness of the face, arm, or leg, trouble speaking, trouble walking, vomiting Bowel blockage--stomach cramping, unable to have a bowel movement or pass gas, loss of appetite, vomiting Heart failure--shortness of breath, swelling of the ankles, feet, or hands, sudden weight gain, unusual weakness or fatigue Infection--fever, chills, cough, sore throat, wounds that don't heal, pain or trouble when passing urine, general feeling of discomfort or being unwell Liver injury--right upper belly pain, loss of appetite, nausea, light-colored stool, dark yellow or Rusell Meneely urine, yellowing skin or eyes, unusual weakness or fatigue Low blood pressure--dizziness, feeling faint or lightheaded, blurry  vision Lung injury--shortness of breath or trouble breathing, cough, spitting up blood, chest pain, fever Pain, tingling, or numbness in the hands or feet Severe or prolonged diarrhea Stomach pain, bloody diarrhea, pale skin, unusual weakness or fatigue, decrease in the amount of urine, which may be signs of hemolytic uremic syndrome Sudden and severe headache, confusion, change in vision, seizures, which may be signs of posterior reversible encephalopathy syndrome (PRES) TTP--purple spots on the skin or inside the mouth, pale skin, yellowing skin or eyes, unusual weakness or fatigue, fever, fast or irregular heartbeat, confusion, change in vision, trouble speaking, trouble walking Tumor lysis syndrome (TLS)--nausea, vomiting, diarrhea, decrease in the amount of urine, dark urine, unusual weakness or fatigue, confusion, muscle pain or cramps, fast or irregular heartbeat, joint pain Side effects that usually do not require medical attention (report to your care team if they continue or are bothersome): Constipation Diarrhea Fatigue Loss of appetite Nausea This list may not describe all possible side effects. Call your doctor for medical advice about side effects. You may report side effects to FDA at 1-800-FDA-1088. Where should I keep my medication? This medication is given in a hospital or clinic. It will not be stored at home. NOTE: This sheet is a summary. It may not cover all possible information.  If you have questions about this medicine, talk to your doctor, pharmacist, or health care provider.  2024 Elsevier/Gold Standard (2022-05-19 00:00:00)       To help prevent nausea and vomiting after your treatment, we encourage you to take your nausea medication as directed.  BELOW ARE SYMPTOMS THAT SHOULD BE REPORTED IMMEDIATELY: *FEVER GREATER THAN 100.4 F (38 C) OR HIGHER *CHILLS OR SWEATING *NAUSEA AND VOMITING THAT IS NOT CONTROLLED WITH YOUR NAUSEA MEDICATION *UNUSUAL SHORTNESS OF  BREATH *UNUSUAL BRUISING OR BLEEDING *URINARY PROBLEMS (pain or burning when urinating, or frequent urination) *BOWEL PROBLEMS (unusual diarrhea, constipation, pain near the anus) TENDERNESS IN MOUTH AND THROAT WITH OR WITHOUT PRESENCE OF ULCERS (sore throat, sores in mouth, or a toothache) UNUSUAL RASH, SWELLING OR PAIN  UNUSUAL VAGINAL DISCHARGE OR ITCHING   Items with * indicate a potential emergency and should be followed up as soon as possible or go to the Emergency Department if any problems should occur.  Please show the CHEMOTHERAPY ALERT CARD or IMMUNOTHERAPY ALERT CARD at check-in to the Emergency Department and triage nurse.  Should you have questions after your visit or need to cancel or reschedule your appointment, please contact [facility] [phone]  and follow the prompts.  Office hours are 8:00 a.m. to 4:30 p.m. Monday - Friday. Please note that voicemails left after 4:00 p.m. may not be returned until the following business day.  We are closed weekends and major holidays. You have access to a nurse at all times for urgent questions. Please call the main number to the clinic [phone] and follow the prompts.  For any non-urgent questions, you may also contact your provider using MyChart. We now offer e-Visits for anyone 86 and older to request care online for non-urgent symptoms. For details visit [email or web address].   Also download the MyChart app! Go to the app store, search "MyChart", open the app, select [place], and log in with your MyChart username and password.

## 2023-09-22 NOTE — Progress Notes (Signed)
Patient presents today for Velcade injection.  Patient is in satisfactory condition with no new complaints voiced.  Vital signs are stable.  Labs reviewed and all labs are within treatment parameters.  Hemoglobin today is 11.3.  We will hold Retacrit today per orders.   We will proceed with treatment per MD orders.    Patient tolerated injection with no complaints voiced.  Site clean and dry with no bruising or swelling noted.  No complaints of pain.  Discharged with vital signs stable and no signs or symptoms of distress noted.

## 2023-10-05 ENCOUNTER — Other Ambulatory Visit: Payer: Self-pay

## 2023-10-06 ENCOUNTER — Inpatient Hospital Stay: Payer: Medicare HMO

## 2023-10-06 ENCOUNTER — Inpatient Hospital Stay: Payer: Medicare HMO | Attending: Hematology

## 2023-10-06 ENCOUNTER — Inpatient Hospital Stay: Payer: Medicare HMO | Admitting: Hematology

## 2023-10-06 VITALS — BP 174/78 | HR 68 | Temp 97.4°F | Resp 20 | Wt 169.4 lb

## 2023-10-06 DIAGNOSIS — C9000 Multiple myeloma not having achieved remission: Secondary | ICD-10-CM | POA: Diagnosis present

## 2023-10-06 DIAGNOSIS — Z5112 Encounter for antineoplastic immunotherapy: Secondary | ICD-10-CM | POA: Insufficient documentation

## 2023-10-06 LAB — CBC WITH DIFFERENTIAL/PLATELET
Abs Immature Granulocytes: 0.02 10*3/uL (ref 0.00–0.07)
Basophils Absolute: 0 10*3/uL (ref 0.0–0.1)
Basophils Relative: 0 %
Eosinophils Absolute: 0.1 10*3/uL (ref 0.0–0.5)
Eosinophils Relative: 2 %
HCT: 33.7 % — ABNORMAL LOW (ref 36.0–46.0)
Hemoglobin: 11.2 g/dL — ABNORMAL LOW (ref 12.0–15.0)
Immature Granulocytes: 0 %
Lymphocytes Relative: 20 %
Lymphs Abs: 1.4 10*3/uL (ref 0.7–4.0)
MCH: 33.6 pg (ref 26.0–34.0)
MCHC: 33.2 g/dL (ref 30.0–36.0)
MCV: 101.2 fL — ABNORMAL HIGH (ref 80.0–100.0)
Monocytes Absolute: 0.4 10*3/uL (ref 0.1–1.0)
Monocytes Relative: 5 %
Neutro Abs: 5.3 10*3/uL (ref 1.7–7.7)
Neutrophils Relative %: 73 %
Platelets: 202 10*3/uL (ref 150–400)
RBC: 3.33 MIL/uL — ABNORMAL LOW (ref 3.87–5.11)
RDW: 13.3 % (ref 11.5–15.5)
WBC: 7.2 10*3/uL (ref 4.0–10.5)
nRBC: 0 % (ref 0.0–0.2)

## 2023-10-06 LAB — COMPREHENSIVE METABOLIC PANEL
ALT: 13 U/L (ref 0–44)
AST: 16 U/L (ref 15–41)
Albumin: 3.5 g/dL (ref 3.5–5.0)
Alkaline Phosphatase: 103 U/L (ref 38–126)
Anion gap: 10 (ref 5–15)
BUN: 14 mg/dL (ref 8–23)
CO2: 25 mmol/L (ref 22–32)
Calcium: 9.1 mg/dL (ref 8.9–10.3)
Chloride: 99 mmol/L (ref 98–111)
Creatinine, Ser: 1 mg/dL (ref 0.44–1.00)
GFR, Estimated: 56 mL/min — ABNORMAL LOW (ref >60)
Glucose, Bld: 295 mg/dL — ABNORMAL HIGH (ref 70–99)
Potassium: 3.4 mmol/L — ABNORMAL LOW (ref 3.5–5.1)
Sodium: 134 mmol/L — ABNORMAL LOW (ref 135–145)
Total Bilirubin: 0.7 mg/dL (ref 0.3–1.2)
Total Protein: 7 g/dL (ref 6.5–8.1)

## 2023-10-06 LAB — MAGNESIUM: Magnesium: 1.7 mg/dL (ref 1.7–2.4)

## 2023-10-06 MED ORDER — BORTEZOMIB CHEMO SQ INJECTION 3.5 MG (2.5MG/ML)
1.3000 mg/m2 | Freq: Once | INTRAMUSCULAR | Status: AC
  Administered 2023-10-06: 2.25 mg via SUBCUTANEOUS
  Filled 2023-10-06: qty 0.9

## 2023-10-06 MED ORDER — DEXAMETHASONE 4 MG PO TABS
20.0000 mg | ORAL_TABLET | Freq: Once | ORAL | Status: AC
  Administered 2023-10-06: 20 mg via ORAL
  Filled 2023-10-06: qty 5

## 2023-10-06 MED ORDER — PROCHLORPERAZINE MALEATE 10 MG PO TABS
10.0000 mg | ORAL_TABLET | Freq: Once | ORAL | Status: AC
  Administered 2023-10-06: 10 mg via ORAL
  Filled 2023-10-06: qty 1

## 2023-10-06 NOTE — Progress Notes (Signed)
Labs meet parameters for treatment. Proceed as planned.   Velcade injection given today per orders.No retacrit needed for hemoglobin of 11.2. per Dr. Kirtland Bouchard, no more B12 injections needed.   Vitals stable and discharged home from clinic ambulatory. Follow up as scheduled.

## 2023-10-06 NOTE — Progress Notes (Signed)
Patient is no longer receiving B12 injections.  Anola Gurney [place], [place], BCPS, BCOP 10/06/2023 2:24 PM

## 2023-10-06 NOTE — Patient Instructions (Signed)
[facility] [place]  Discharge Instructions: Thank you for choosing [facility] to provide your oncology and hematology care.  If you have a lab appointment with the Cancer Center - please note that after April 8th, 2024, all labs will be drawn in the cancer center.  You do not have to check in or register with the main entrance as you have in the past but will complete your check-in in the cancer center.  Wear comfortable clothing and clothing appropriate for easy access to any Portacath or PICC line.   We strive to give you quality time with your provider. You may need to reschedule your appointment if you arrive late (15 or more minutes).  Arriving late affects you and other patients whose appointments are after yours.  Also, if you miss three or more appointments without notifying the office, you may be dismissed from the clinic at the provider's discretion.      For prescription refill requests, have your pharmacy contact our office and allow 72 hours for refills to be completed.    Today you received the following chemotherapy and/or immunotherapy agents Velcade      To help prevent nausea and vomiting after your treatment, we encourage you to take your nausea medication as directed.  BELOW ARE SYMPTOMS THAT SHOULD BE REPORTED IMMEDIATELY: *FEVER GREATER THAN 100.4 F (38 C) OR HIGHER *CHILLS OR SWEATING *NAUSEA AND VOMITING THAT IS NOT CONTROLLED WITH YOUR NAUSEA MEDICATION *UNUSUAL SHORTNESS OF BREATH *UNUSUAL BRUISING OR BLEEDING *URINARY PROBLEMS (pain or burning when urinating, or frequent urination) *BOWEL PROBLEMS (unusual diarrhea, constipation, pain near the anus) TENDERNESS IN MOUTH AND THROAT WITH OR WITHOUT PRESENCE OF ULCERS (sore throat, sores in mouth, or a toothache) UNUSUAL RASH, SWELLING OR PAIN  UNUSUAL VAGINAL DISCHARGE OR ITCHING   Items with * indicate a potential emergency and should be followed up as soon as possible or go to the  Emergency Department if any problems should occur.  Please show the CHEMOTHERAPY ALERT CARD or IMMUNOTHERAPY ALERT CARD at check-in to the Emergency Department and triage nurse.  Should you have questions after your visit or need to cancel or reschedule your appointment, please contact [facility] [place] [phone]  and follow the prompts.  Office hours are 8:00 a.m. to 4:30 p.m. Monday - Friday. Please note that voicemails left after 4:00 p.m. may not be returned until the following business day.  We are closed weekends and major holidays. You have access to a nurse at all times for urgent questions. Please call the main number to the clinic [phone] and follow the prompts.  For any non-urgent questions, you may also contact your provider using MyChart. We now offer e-Visits for anyone 18 and older to request care online for non-urgent symptoms. For details visit [email or web address].   Also download the MyChart app! Go to the app store, search "MyChart", open the app, select [place], and log in with your MyChart username and password.   

## 2023-10-20 ENCOUNTER — Inpatient Hospital Stay: Payer: Medicare HMO

## 2023-10-20 VITALS — BP 139/69 | HR 64 | Temp 97.9°F | Resp 18 | Wt 168.4 lb

## 2023-10-20 DIAGNOSIS — C9000 Multiple myeloma not having achieved remission: Secondary | ICD-10-CM

## 2023-10-20 DIAGNOSIS — Z5112 Encounter for antineoplastic immunotherapy: Secondary | ICD-10-CM | POA: Diagnosis not present

## 2023-10-20 LAB — CBC WITH DIFFERENTIAL/PLATELET
Abs Immature Granulocytes: 0 10*3/uL (ref 0.00–0.07)
Basophils Absolute: 0 10*3/uL (ref 0.0–0.1)
Basophils Relative: 0 %
Eosinophils Absolute: 0.1 10*3/uL (ref 0.0–0.5)
Eosinophils Relative: 1 %
HCT: 34.3 % — ABNORMAL LOW (ref 36.0–46.0)
Hemoglobin: 11 g/dL — ABNORMAL LOW (ref 12.0–15.0)
Immature Granulocytes: 0 %
Lymphocytes Relative: 24 %
Lymphs Abs: 1.3 10*3/uL (ref 0.7–4.0)
MCH: 32.9 pg (ref 26.0–34.0)
MCHC: 32.1 g/dL (ref 30.0–36.0)
MCV: 102.7 fL — ABNORMAL HIGH (ref 80.0–100.0)
Monocytes Absolute: 0.4 10*3/uL (ref 0.1–1.0)
Monocytes Relative: 8 %
Neutro Abs: 3.7 10*3/uL (ref 1.7–7.7)
Neutrophils Relative %: 67 %
Platelets: 193 10*3/uL (ref 150–400)
RBC: 3.34 MIL/uL — ABNORMAL LOW (ref 3.87–5.11)
RDW: 13.4 % (ref 11.5–15.5)
WBC: 5.5 10*3/uL (ref 4.0–10.5)
nRBC: 0 % (ref 0.0–0.2)

## 2023-10-20 LAB — COMPREHENSIVE METABOLIC PANEL
ALT: 14 U/L (ref 0–44)
AST: 17 U/L (ref 15–41)
Albumin: 3.6 g/dL (ref 3.5–5.0)
Alkaline Phosphatase: 94 U/L (ref 38–126)
Anion gap: 10 (ref 5–15)
BUN: 15 mg/dL (ref 8–23)
CO2: 25 mmol/L (ref 22–32)
Calcium: 9.3 mg/dL (ref 8.9–10.3)
Chloride: 103 mmol/L (ref 98–111)
Creatinine, Ser: 1.04 mg/dL — ABNORMAL HIGH (ref 0.44–1.00)
GFR, Estimated: 53 mL/min — ABNORMAL LOW (ref >60)
Glucose, Bld: 196 mg/dL — ABNORMAL HIGH (ref 70–99)
Potassium: 3.7 mmol/L (ref 3.5–5.1)
Sodium: 138 mmol/L (ref 135–145)
Total Bilirubin: 0.7 mg/dL (ref 0.3–1.2)
Total Protein: 7.3 g/dL (ref 6.5–8.1)

## 2023-10-20 LAB — MAGNESIUM: Magnesium: 1.8 mg/dL (ref 1.7–2.4)

## 2023-10-20 MED ORDER — PROCHLORPERAZINE MALEATE 10 MG PO TABS
10.0000 mg | ORAL_TABLET | Freq: Once | ORAL | Status: AC
  Administered 2023-10-20: 10 mg via ORAL
  Filled 2023-10-20: qty 1

## 2023-10-20 MED ORDER — BORTEZOMIB CHEMO SQ INJECTION 3.5 MG (2.5MG/ML)
1.3000 mg/m2 | Freq: Once | INTRAMUSCULAR | Status: AC
  Administered 2023-10-20: 2.25 mg via SUBCUTANEOUS
  Filled 2023-10-20: qty 0.9

## 2023-10-20 MED ORDER — DEXAMETHASONE 4 MG PO TABS
20.0000 mg | ORAL_TABLET | Freq: Once | ORAL | Status: AC
  Administered 2023-10-20: 20 mg via ORAL
  Filled 2023-10-20: qty 5

## 2023-10-20 NOTE — Progress Notes (Signed)
Patient tolerated Velcade injection with no complaints voiced.  Side effects with management reviewed with understanding verbalized. Labs WNL and Hgb-11, no Retacrit needed at this time per parameters.  Patient left in satisfactory condition with VSS and no s/s of distress noted. All follow ups as scheduled.   Brittany [employer]

## 2023-10-20 NOTE — Patient Instructions (Signed)
[facility] [place]  Discharge Instructions: Thank you for choosing [facility] to provide your oncology and hematology care.  If you have a lab appointment with the Cancer Center - please note that after April 8th, 2024, all labs will be drawn in the cancer center.  You do not have to check in or register with the main entrance as you have in the past but will complete your check-in in the cancer center.  Wear comfortable clothing and clothing appropriate for easy access to any Portacath or PICC line.   We strive to give you quality time with your provider. You may need to reschedule your appointment if you arrive late (15 or more minutes).  Arriving late affects you and other patients whose appointments are after yours.  Also, if you miss three or more appointments without notifying the office, you may be dismissed from the clinic at the provider's discretion.      For prescription refill requests, have your pharmacy contact our office and allow 72 hours for refills to be completed.    Today you received the following chemotherapy and/or immunotherapy agents Velcade      To help prevent nausea and vomiting after your treatment, we encourage you to take your nausea medication as directed.  BELOW ARE SYMPTOMS THAT SHOULD BE REPORTED IMMEDIATELY: *FEVER GREATER THAN 100.4 F (38 C) OR HIGHER *CHILLS OR SWEATING *NAUSEA AND VOMITING THAT IS NOT CONTROLLED WITH YOUR NAUSEA MEDICATION *UNUSUAL SHORTNESS OF BREATH *UNUSUAL BRUISING OR BLEEDING *URINARY PROBLEMS (pain or burning when urinating, or frequent urination) *BOWEL PROBLEMS (unusual diarrhea, constipation, pain near the anus) TENDERNESS IN MOUTH AND THROAT WITH OR WITHOUT PRESENCE OF ULCERS (sore throat, sores in mouth, or a toothache) UNUSUAL RASH, SWELLING OR PAIN  UNUSUAL VAGINAL DISCHARGE OR ITCHING   Items with * indicate a potential emergency and should be followed up as soon as possible or go to the  Emergency Department if any problems should occur.  Please show the CHEMOTHERAPY ALERT CARD or IMMUNOTHERAPY ALERT CARD at check-in to the Emergency Department and triage nurse.  Should you have questions after your visit or need to cancel or reschedule your appointment, please contact [facility] [place] [phone]  and follow the prompts.  Office hours are 8:00 a.m. to 4:30 p.m. Monday - Friday. Please note that voicemails left after 4:00 p.m. may not be returned until the following business day.  We are closed weekends and major holidays. You have access to a nurse at all times for urgent questions. Please call the main number to the clinic [phone] and follow the prompts.  For any non-urgent questions, you may also contact your provider using MyChart. We now offer e-Visits for anyone 18 and older to request care online for non-urgent symptoms. For details visit [email or web address].   Also download the MyChart app! Go to the app store, search "MyChart", open the app, select [place], and log in with your MyChart username and password.   

## 2023-10-21 ENCOUNTER — Other Ambulatory Visit: Payer: Self-pay

## 2023-10-22 LAB — KAPPA/LAMBDA LIGHT CHAINS
Kappa free light chain: 143.8 mg/L — ABNORMAL HIGH (ref 3.3–19.4)
Kappa, lambda light chain ratio: 8.72 — ABNORMAL HIGH (ref 0.26–1.65)
Lambda free light chains: 16.5 mg/L (ref 5.7–26.3)

## 2023-10-24 LAB — PROTEIN ELECTROPHORESIS, SERUM
A/G Ratio: 1 (ref 0.7–1.7)
Albumin ELP: 3.4 g/dL (ref 2.9–4.4)
Alpha-1-Globulin: 0.2 g/dL (ref 0.0–0.4)
Alpha-2-Globulin: 0.7 g/dL (ref 0.4–1.0)
Beta Globulin: 1.5 g/dL — ABNORMAL HIGH (ref 0.7–1.3)
Gamma Globulin: 0.9 g/dL (ref 0.4–1.8)
Globulin, Total: 3.3 g/dL (ref 2.2–3.9)
M-Spike, %: 0.5 g/dL — ABNORMAL HIGH
Total Protein ELP: 6.7 g/dL (ref 6.0–8.5)

## 2023-10-25 LAB — IMMUNOFIXATION ELECTROPHORESIS
IgA: 872 mg/dL — ABNORMAL HIGH (ref 64–422)
IgG (Immunoglobin G), Serum: 899 mg/dL (ref 586–1602)
IgM (Immunoglobulin M), Srm: 116 mg/dL (ref 26–217)
Total Protein ELP: 6.7 g/dL (ref 6.0–8.5)

## 2023-11-03 ENCOUNTER — Inpatient Hospital Stay: Payer: Medicare HMO

## 2023-11-03 ENCOUNTER — Inpatient Hospital Stay: Payer: Medicare HMO | Admitting: Hematology

## 2023-11-17 ENCOUNTER — Inpatient Hospital Stay: Payer: Medicare HMO | Attending: Hematology

## 2023-11-17 ENCOUNTER — Ambulatory Visit: Payer: Medicare HMO | Admitting: Hematology

## 2023-11-17 ENCOUNTER — Inpatient Hospital Stay: Payer: Medicare HMO

## 2023-11-17 VITALS — BP 150/73 | HR 81 | Temp 97.5°F | Resp 18 | Wt 172.4 lb

## 2023-11-17 DIAGNOSIS — C9000 Multiple myeloma not having achieved remission: Secondary | ICD-10-CM | POA: Diagnosis present

## 2023-11-17 DIAGNOSIS — Z5112 Encounter for antineoplastic immunotherapy: Secondary | ICD-10-CM | POA: Diagnosis present

## 2023-11-17 LAB — CBC WITH DIFFERENTIAL/PLATELET
Abs Immature Granulocytes: 0.01 10*3/uL (ref 0.00–0.07)
Basophils Absolute: 0 10*3/uL (ref 0.0–0.1)
Basophils Relative: 0 %
Eosinophils Absolute: 0 10*3/uL (ref 0.0–0.5)
Eosinophils Relative: 1 %
HCT: 33.4 % — ABNORMAL LOW (ref 36.0–46.0)
Hemoglobin: 10.6 g/dL — ABNORMAL LOW (ref 12.0–15.0)
Immature Granulocytes: 0 %
Lymphocytes Relative: 32 %
Lymphs Abs: 1.7 10*3/uL (ref 0.7–4.0)
MCH: 32.9 pg (ref 26.0–34.0)
MCHC: 31.7 g/dL (ref 30.0–36.0)
MCV: 103.7 fL — ABNORMAL HIGH (ref 80.0–100.0)
Monocytes Absolute: 0.4 10*3/uL (ref 0.1–1.0)
Monocytes Relative: 8 %
Neutro Abs: 3 10*3/uL (ref 1.7–7.7)
Neutrophils Relative %: 59 %
Platelets: 197 10*3/uL (ref 150–400)
RBC: 3.22 MIL/uL — ABNORMAL LOW (ref 3.87–5.11)
RDW: 13.5 % (ref 11.5–15.5)
WBC: 5.1 10*3/uL (ref 4.0–10.5)
nRBC: 0 % (ref 0.0–0.2)

## 2023-11-17 LAB — COMPREHENSIVE METABOLIC PANEL
ALT: 11 U/L (ref 0–44)
AST: 14 U/L — ABNORMAL LOW (ref 15–41)
Albumin: 3.5 g/dL (ref 3.5–5.0)
Alkaline Phosphatase: 86 U/L (ref 38–126)
Anion gap: 8 (ref 5–15)
BUN: 15 mg/dL (ref 8–23)
CO2: 27 mmol/L (ref 22–32)
Calcium: 9.4 mg/dL (ref 8.9–10.3)
Chloride: 103 mmol/L (ref 98–111)
Creatinine, Ser: 1.23 mg/dL — ABNORMAL HIGH (ref 0.44–1.00)
GFR, Estimated: 44 mL/min — ABNORMAL LOW (ref >60)
Glucose, Bld: 280 mg/dL — ABNORMAL HIGH (ref 70–99)
Potassium: 3.9 mmol/L (ref 3.5–5.1)
Sodium: 138 mmol/L (ref 135–145)
Total Bilirubin: 0.7 mg/dL (ref <1.2)
Total Protein: 6.9 g/dL (ref 6.5–8.1)

## 2023-11-17 LAB — MAGNESIUM: Magnesium: 1.8 mg/dL (ref 1.7–2.4)

## 2023-11-17 MED ORDER — PROCHLORPERAZINE MALEATE 10 MG PO TABS
10.0000 mg | ORAL_TABLET | Freq: Once | ORAL | Status: AC
  Administered 2023-11-17: 10 mg via ORAL
  Filled 2023-11-17: qty 1

## 2023-11-17 MED ORDER — DEXAMETHASONE 4 MG PO TABS
20.0000 mg | ORAL_TABLET | Freq: Once | ORAL | Status: AC
  Administered 2023-11-17: 20 mg via ORAL
  Filled 2023-11-17: qty 5

## 2023-11-17 MED ORDER — BORTEZOMIB CHEMO SQ INJECTION 3.5 MG (2.5MG/ML)
1.3000 mg/m2 | Freq: Once | INTRAMUSCULAR | Status: AC
Start: 2023-11-17 — End: 2023-11-17
  Administered 2023-11-17: 2.25 mg via SUBCUTANEOUS
  Filled 2023-11-17: qty 0.9

## 2023-11-17 NOTE — Progress Notes (Signed)
Patient tolerated Velcade injection with no complaints voiced. Lab work reviewed. See MAR for details. Injection site clean and dry with no bruising or swelling noted. Patient stable during and after injection. Band aid applied. VSS. Patient left in satisfactory condition with no s/s of distress noted. 

## 2023-11-17 NOTE — Patient Instructions (Signed)
 [facility] A DEPT OF [facility]  Discharge Instructions: Thank you for choosing [facility] to provide your oncology and hematology care.  If you have a lab appointment with the Cancer Center - please note that after April 8th, 2024, all labs will be drawn in the cancer center.  You do not have to check in or register with the main entrance as you have in the past but will complete your check-in in the cancer center.  Wear comfortable clothing and clothing appropriate for easy access to any Portacath or PICC line.   We strive to give you quality time with your provider. You may need to reschedule your appointment if you arrive late (15 or more minutes).  Arriving late affects you and other patients whose appointments are after yours.  Also, if you miss three or more appointments without notifying the office, you may be dismissed from the clinic at the provider's discretion.      For prescription refill requests, have your pharmacy contact our office and allow 72 hours for refills to be completed.    Today you received the following chemotherapy and/or immunotherapy agents Velcade, return as scheduled.   To help prevent nausea and vomiting after your treatment, we encourage you to take your nausea medication as directed.  BELOW ARE SYMPTOMS THAT SHOULD BE REPORTED IMMEDIATELY: *FEVER GREATER THAN 100.4 F (38 C) OR HIGHER *CHILLS OR SWEATING *NAUSEA AND VOMITING THAT IS NOT CONTROLLED WITH YOUR NAUSEA MEDICATION *UNUSUAL SHORTNESS OF BREATH *UNUSUAL BRUISING OR BLEEDING *URINARY PROBLEMS (pain or burning when urinating, or frequent urination) *BOWEL PROBLEMS (unusual diarrhea, constipation, pain near the anus) TENDERNESS IN MOUTH AND THROAT WITH OR WITHOUT PRESENCE OF ULCERS (sore throat, sores in mouth, or a toothache) UNUSUAL RASH, SWELLING OR PAIN  UNUSUAL VAGINAL DISCHARGE OR ITCHING   Items with * indicate a potential emergency and  should be followed up as soon as possible or go to the Emergency Department if any problems should occur.  Please show the CHEMOTHERAPY ALERT CARD or IMMUNOTHERAPY ALERT CARD at check-in to the Emergency Department and triage nurse.  Should you have questions after your visit or need to cancel or reschedule your appointment, please contact [facility] - [facility]PT OF Eligh[facility] [phone]  and follow the prompts.  Office hours are 8:00 a.m. to 4:30 p.m. Monday - Friday. Please note that voicemails left after 4:00 p.m. may not be returned until the following business day.  We are closed weekends and major holidays. You have access to a nurse at all times for urgent questions. Please call the main number to the clinic [phone] and follow the prompts.  For any non-urgent questions, you may also contact your provider using MyChart. We now offer e-Visits for anyone 19 and older to request care online for non-urgent symptoms. For details visit [email or web address].   Also download the MyChart app! Go to the app store, search "MyChart", open the app, select [place], and log in with your MyChart username and password.

## 2023-11-30 NOTE — Progress Notes (Signed)
[name] [address] [place] [address]    Clinic Day:  12/02/2023  Referring physician: [name], MD  Patient Care Team: Brittany Filbert, MD as PCP - General (Internal Medicine) Brittany Massed, MD as Medical Oncologist (Hematology)   ASSESSMENT & PLAN:   Assessment: 1.  IgA kappa plasma cell myeloma, stage I: -RVD started on 01/09/2018, held since 02/14/2020 due to mandible abscess. -Myeloma labs on 05/14/2020 showed progression with M spike of 0.5 g. -RVD started back on 05/21/2020. -Myeloma labs on 07/10/2020 shows M spike improved to 0.3 g from 0.6 g previously.  Free light chain ratio is 1.74 with kappa light chains 28.4. -Myeloma panel from 08/14/2020 shows M spike 0.4 g.  Kappa light chains are 24.8 and ratio is 2.23. - Revlimid (20 mg 2 weeks on/1 week off) and Velcade (3 weeks on/1 week off) were held after 02/26/2022 due to weight loss. - Velcade maintenance every 2 weeks started on 04/13/2022.   2.  Osteomyelitis of the right mandible/dental abscess: -Finished IV ceftriaxone on 04/03/2020.  Finished oral antibiotics. -We will hold Xgeva indefinitely.    Plan: 1.  IgA kappa plasma cell myeloma, stage I: - She is tolerating every other week Velcade reasonably well.  Denies any neuropathy. - She is also on dexamethasone 20 mg on the days of Velcade injection. - Reviewed myeloma labs from 10/20/2023: M spike is 0.5 g.  Kappa light chains are trending up at 143.  Light chain ratio is also trending up at 8.72.  Immunofixation is positive for IgA kappa. - She is gradually progressing.  I will send another SPEP and free light chains today. - I will set up a whole-body PET scan for staging. - We will have to change her therapy at this time.  Options include adding lenalidomide or pomalidomide.  We had discontinued lenalidomide in March 2023 due to weight loss. - RTC after the PET scan.   2.  Severe hypokalemia: - Continue potassium 3 times daily.   Potassium is normal at 3.5.   3.  Osteomyelitis of the right mandible/dental abscess: - Bisphosphonates on hold due to ONJ.   4.  Hypomagnesemia: - Continue magnesium 3 times daily.  Magnesium is 1.6 today.   5.  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  Hemoglobin is stable and 11.8 today.    Orders Placed This Encounter  Procedures   NM PET Image Restage (PS) Skull Base to Thigh (F-18 FDG)    Standing Status:   Future    Standing Expiration Date:   12/01/2024    Order Specific Question:   If indicated for the ordered procedure, I authorize the administration of a radiopharmaceutical per Radiology protocol    Answer:   Yes    Order Specific Question:   Preferred imaging location?    Answer:   [facility]    Order Specific Question:   Release to patient    Answer:   Immediate   Kappa/lambda light chains    Standing Status:   Future    Number of Occurrences:   1    Standing Expiration Date:   12/01/2024   Protein electrophoresis, serum    Standing Status:   Future    Number of Occurrences:   1    Standing Expiration Date:   12/01/2024      Brittany Archer,acting as a scribe for Brittany Massed, MD.,have documented all relevant documentation on the behalf of Brittany Massed,  MD,as directed by  Brittany Massed, MD while in the presence of Brittany Massed, MD.  I, Brittany Massed MD, have reviewed the above documentation for accuracy and completeness, and I agree with the above.     Brittany Massed, MD   12/5/20246:02 PM  CHIEF COMPLAINT:   Diagnosis: multiple myeloma    Cancer Staging  No matching staging information was found for the patient.    Prior Therapy: RVD 01/09/18 through 02/14/20, 05/21/20 through 02/26/22   Current Therapy:  Maintenance Velcade every other week.    HISTORY OF PRESENT ILLNESS:   Oncology History  Multiple myeloma not having achieved remission (HCC)  01/20/2018 Initial Diagnosis    Multiple myeloma not having achieved remission (HCC)   01/26/2018 - 09/01/2022 Chemotherapy   Patient is on Treatment Plan : MYELOMA  RVD SQ (Bortezomib d 1,8,15 ) q28d x 4 cycles     04/13/2022 -  Chemotherapy   Patient is on Treatment Plan : MYELOMA MAINTENANCE Bortezomib SQ q14d        INTERVAL HISTORY:   Brittany Archer is a 82 y.o. female presenting to clinic today for follow up of multiple myeloma. She was last seen by me on 08/11/23.  Today, she states that she is doing well overall. Her appetite level is at 100%. Her energy level is at 75%. She is accompanied by her daughter.  She is taking magnesium and calcium as prescribed. She denies any recent falls, new bone pains, and peripheral numbness or tingling.   PAST MEDICAL HISTORY:   Past Medical History: Past Medical History:  Diagnosis Date   Breast cancer (HCC)    left breast/ 2008/ surg/ rad tx   Coronary artery disease    Diabetes mellitus     Surgical History: Past Surgical History:  Procedure Laterality Date   ABDOMINAL HYSTERECTOMY     BREAST SURGERY     DEBRIDEMENT MANDIBLE N/A 02/22/2020   Procedure: INCISION AND DRAINAGE WITH DEBRIDEMENT MANDIBLE;  Surgeon: Brittany Archer, DMD;  Location: [facility];  Service: Oral Surgery;  Laterality: N/A;   DEBRIDEMENT MANDIBLE Right 03/19/2021   Procedure: DEBRIDEMENT OF BONE RIGHT INTERIOR  MANDIBLE;  Surgeon: Brittany Archer, DMD;  Location: [facility];  Service: Oral Surgery;  Laterality: Right;   EYE SURGERY  2021   cataract removals    TOOTH EXTRACTION N/A 02/22/2020   Procedure: DENTAL RESTORATION/EXTRACTIONS;  Surgeon: Brittany Archer, DMD;  Location: [facility];  Service: Oral Surgery;  Laterality: N/A;  DENTAL KIT REQUESTED    Social History: Social History   Socioeconomic History   Marital status: Divorced    Spouse name: Not on file   Number of children: Not on file   Years of education: Not on file   Highest education level: Not on file  Occupational History   Not on file  Tobacco  Use   Smoking status: Never   Smokeless tobacco: Never  Vaping Use   Vaping status: Never Used  Substance and Sexual Activity   Alcohol use: No   Drug use: No   Sexual activity: Yes    Birth control/protection: Surgical  Other Topics Concern   Not on file  Social History Narrative   Not on file   Social Determinants of Health   Financial Resource Strain: Low Risk  (12/09/2020)   Overall Financial Resource Strain (CARDIA)    Difficulty of Paying Living Expenses: Not hard at all  Food Insecurity: No Food Insecurity (12/09/2020)   Hunger Vital Sign    Worried About  Running Out of Food in the Last Year: Never true    Ran Out of Food in the Last Year: Never true  Transportation Needs: No Transportation Needs (12/09/2020)   PRAPARE - Administrator, Civil Service (Medical): No    Lack of Transportation (Non-Medical): No  Physical Activity: Inactive (12/09/2020)   Exercise Vital Sign    Days of Exercise per Week: 0 days    Minutes of Exercise per Session: 0 min  Stress: No Stress Concern Present (12/09/2020)   [facility] [employer] Occupational [employer] - Occupational Stress Questionnaire    Feeling of Stress : Not at all  Social Connections: Moderately Isolated (12/09/2020)   Social Connection and Isolation Panel [NHANES]    Frequency of Communication with Friends and Family: More than three times a week    Frequency of Social Gatherings with Friends and Family: More than three times a week    Attends Religious Services: More than 4 times per year    Active Member of [employer] or Organizations: No    Attends Banker Meetings: Never    Marital Status: Divorced  Catering manager Violence: Not At Risk (12/09/2020)   Humiliation, Afraid, Rape, and Kick questionnaire    Fear of Current or Ex-Partner: No    Emotionally Abused: No    Physically Abused: No    Sexually Abused: No    Family History: Family History  Problem Relation Age of Onset   Obesity Sister      Current Medications:  Current Outpatient Medications:    acyclovir (ZOVIRAX) 400 MG tablet, Take 400 mg by mouth 2 (two) times daily., Disp: , Rfl:    aspirin 81 MG tablet, Take 1 tablet (81 mg total) by mouth daily with breakfast., Disp: 30 tablet, Rfl: 5   atorvastatin (LIPITOR) 10 MG tablet, Take 10 mg by mouth daily., Disp: , Rfl:    bortezomib IV (VELCADE) 3.5 MG injection, 3.5 mg once a week. weekly, Disp: , Rfl:    chlorhexidine (PERIDEX) 0.12 % solution, Use as directed 15 mLs in the mouth or throat 3 (three) times daily., Disp: , Rfl:    cholecalciferol (VITAMIN D) 25 MCG (1000 UNIT) tablet, 1 capsule, Disp: , Rfl:    glucose blood (ACCU-CHEK AVIVA PLUS) test strip, CHECK BLOOD SUGAR ONCE DAILY, Disp: , Rfl:    GNP ASPIRIN LOW DOSE 81 MG tablet, Take 81 mg by mouth daily., Disp: , Rfl:    magnesium oxide (MAG-OX) 400 (240 Mg) MG tablet, TAKE ONE TABLET BY MOUTH THREE TIMES A DAY, Disp: 90 tablet, Rfl: 3   mirtazapine (REMERON) 15 MG tablet, Take 0.5 tablets (7.5 mg total) by mouth at bedtime. For appetite stimulation, Disp: 30 tablet, Rfl: 2   ondansetron (ZOFRAN) 8 MG tablet, , Disp: , Rfl:    Potassium Acetate POWD, Take 20 mEq by mouth daily., Disp: 12000 g, Rfl: 5   potassium chloride (KLOR-CON) 20 MEQ packet, MIX AND TAKE ONE PACKET DAILY., Disp: 30 packet, Rfl: 3   Allergies: Allergies  Allergen Reactions   Other Other (See Comments)   Seasonal Ic [Cholestatin] Other (See Comments)    Sneezing, watery eyes   Motrin [Ibuprofen] Rash    REVIEW OF SYSTEMS:   Review of Systems  Constitutional:  Negative for chills, fatigue and fever.  HENT:   Negative for lump/mass, mouth sores, nosebleeds, sore throat and trouble swallowing.   Eyes:  Negative for eye problems.  Respiratory:  Negative for cough and shortness of  breath.   Cardiovascular:  Negative for chest pain, leg swelling and palpitations.  Gastrointestinal:  Negative for abdominal pain, constipation, diarrhea,  nausea and vomiting.  Genitourinary:  Negative for bladder incontinence, difficulty urinating, dysuria, frequency, hematuria and nocturia.   Musculoskeletal:  Negative for arthralgias, back pain, flank pain, myalgias and neck pain.  Skin:  Negative for itching and rash.  Neurological:  Negative for dizziness, headaches and numbness.  Hematological:  Does not bruise/bleed easily.  Psychiatric/Behavioral:  Negative for depression, sleep disturbance and suicidal ideas. The patient is not nervous/anxious.   All other systems reviewed and are negative.    VITALS:   Blood pressure (!) 151/75, pulse 78, temperature (!) 96.4 F (35.8 C), temperature source Tympanic, resp. rate 18, weight 167 lb 12.8 oz (76.1 kg), SpO2 99%.  Wt Readings from Last 3 Encounters:  12/02/23 167 lb 12.8 oz (76.1 kg)  11/17/23 172 lb 6.4 oz (78.2 kg)  10/20/23 168 lb 6.4 oz (76.4 kg)    Body mass index is 30.69 kg/m.  Performance status (ECOG): 1 - Symptomatic but completely ambulatory  PHYSICAL EXAM:   Physical Exam Vitals and nursing note reviewed. Exam conducted with a chaperone present.  Constitutional:      Appearance: Normal appearance.  Cardiovascular:     Rate and Rhythm: Normal rate and regular rhythm.     Pulses: Normal pulses.     Heart sounds: Normal heart sounds.  Pulmonary:     Effort: Pulmonary effort is normal.     Breath sounds: Normal breath sounds.  Abdominal:     Palpations: Abdomen is soft. There is no hepatomegaly, splenomegaly or mass.     Tenderness: There is no abdominal tenderness.  Musculoskeletal:     Right lower leg: No edema.     Left lower leg: No edema.  Lymphadenopathy:     Cervical: No cervical adenopathy.     Right cervical: No superficial, deep or posterior cervical adenopathy.    Left cervical: No superficial, deep or posterior cervical adenopathy.     Upper Body:     Right upper body: No supraclavicular or axillary adenopathy.     Left upper body: No  supraclavicular or axillary adenopathy.  Neurological:     General: No focal deficit present.     Mental Status: She is alert and oriented to person, place, and time.  Psychiatric:        Mood and Affect: Mood normal.        Behavior: Behavior normal.     LABS:      Latest Ref Rng & Units 12/02/2023    8:45 AM 11/17/2023   12:44 PM 10/20/2023   11:54 AM  CBC  WBC 4.0 - 10.5 K/uL 5.3  5.1  5.5   Hemoglobin 12.0 - 15.0 g/dL 30.8  65.7  84.6   Hematocrit 36.0 - 46.0 % 35.7  33.4  34.3   Platelets 150 - 400 K/uL 169  197  193       Latest Ref Rng & Units 12/02/2023    8:45 AM 11/17/2023   12:44 PM 10/20/2023   11:54 AM  CMP  Glucose 70 - 99 mg/dL 962  952  841   BUN 8 - 23 mg/dL 16  15  15    Creatinine 0.44 - 1.00 mg/dL 3.24  4.01  0.27   Sodium 135 - 145 mmol/L 136  138  138   Potassium 3.5 - 5.1 mmol/L 3.5  3.9  3.7   Chloride  98 - 111 mmol/L 102  103  103   CO2 22 - 32 mmol/L 24  27  25    Calcium 8.9 - 10.3 mg/dL 9.3  9.4  9.3   Total Protein 6.5 - 8.1 g/dL 6.9  6.9  7.3   Total Bilirubin <1.2 mg/dL 0.9  0.7  0.7   Alkaline Phos 38 - 126 U/L 98  86  94   AST 15 - 41 U/L 17  14  17    ALT 0 - 44 U/L 15  11  14       No results found for: "CEA1", "CEA" / No results found for: "CEA1", "CEA" No results found for: "PSA1" No results found for: "ZOX096" No results found for: "CAN125"  Lab Results  Component Value Date   TOTALPROTELP 6.7 10/20/2023   ALBUMINELP 3.4 10/20/2023   A1GS 0.2 10/20/2023   A2GS 0.7 10/20/2023   BETS 1.5 (H) 10/20/2023   GAMS 0.9 10/20/2023   MSPIKE 0.5 (H) 10/20/2023   SPEI Comment 10/20/2023   Lab Results  Component Value Date   TIBC 137 (L) 05/20/2023   TIBC 117 (L) 03/24/2023   TIBC 107 (L) 01/26/2023   FERRITIN 267 05/20/2023   FERRITIN 257 03/24/2023   FERRITIN 339 (H) 01/26/2023   IRONPCTSAT 33 (H) 05/20/2023   IRONPCTSAT 44 (H) 03/24/2023   IRONPCTSAT 50 (H) 01/26/2023   Lab Results  Component Value Date   LDH 137  05/20/2023   LDH 134 03/24/2023   LDH 125 01/26/2023     STUDIES:   No results found.

## 2023-12-01 ENCOUNTER — Inpatient Hospital Stay: Payer: Medicare HMO

## 2023-12-02 ENCOUNTER — Inpatient Hospital Stay: Payer: Medicare HMO

## 2023-12-02 ENCOUNTER — Inpatient Hospital Stay: Payer: Medicare HMO | Attending: Hematology

## 2023-12-02 ENCOUNTER — Inpatient Hospital Stay: Payer: Medicare HMO | Admitting: Hematology

## 2023-12-02 VITALS — BP 151/75 | HR 78 | Temp 96.4°F | Resp 18 | Wt 167.8 lb

## 2023-12-02 DIAGNOSIS — C9000 Multiple myeloma not having achieved remission: Secondary | ICD-10-CM | POA: Diagnosis not present

## 2023-12-02 DIAGNOSIS — D539 Nutritional anemia, unspecified: Secondary | ICD-10-CM | POA: Insufficient documentation

## 2023-12-02 DIAGNOSIS — Z79899 Other long term (current) drug therapy: Secondary | ICD-10-CM | POA: Diagnosis not present

## 2023-12-02 DIAGNOSIS — D631 Anemia in chronic kidney disease: Secondary | ICD-10-CM | POA: Diagnosis not present

## 2023-12-02 DIAGNOSIS — N189 Chronic kidney disease, unspecified: Secondary | ICD-10-CM | POA: Diagnosis not present

## 2023-12-02 LAB — CBC WITH DIFFERENTIAL/PLATELET
Abs Immature Granulocytes: 0.01 10*3/uL (ref 0.00–0.07)
Basophils Absolute: 0 10*3/uL (ref 0.0–0.1)
Basophils Relative: 1 %
Eosinophils Absolute: 0.1 10*3/uL (ref 0.0–0.5)
Eosinophils Relative: 1 %
HCT: 35.7 % — ABNORMAL LOW (ref 36.0–46.0)
Hemoglobin: 11.8 g/dL — ABNORMAL LOW (ref 12.0–15.0)
Immature Granulocytes: 0 %
Lymphocytes Relative: 28 %
Lymphs Abs: 1.5 10*3/uL (ref 0.7–4.0)
MCH: 33.6 pg (ref 26.0–34.0)
MCHC: 33.1 g/dL (ref 30.0–36.0)
MCV: 101.7 fL — ABNORMAL HIGH (ref 80.0–100.0)
Monocytes Absolute: 0.3 10*3/uL (ref 0.1–1.0)
Monocytes Relative: 6 %
Neutro Abs: 3.4 10*3/uL (ref 1.7–7.7)
Neutrophils Relative %: 64 %
Platelets: 169 10*3/uL (ref 150–400)
RBC: 3.51 MIL/uL — ABNORMAL LOW (ref 3.87–5.11)
RDW: 13.2 % (ref 11.5–15.5)
WBC: 5.3 10*3/uL (ref 4.0–10.5)
nRBC: 0 % (ref 0.0–0.2)

## 2023-12-02 LAB — COMPREHENSIVE METABOLIC PANEL
ALT: 15 U/L (ref 0–44)
AST: 17 U/L (ref 15–41)
Albumin: 3.3 g/dL — ABNORMAL LOW (ref 3.5–5.0)
Alkaline Phosphatase: 98 U/L (ref 38–126)
Anion gap: 10 (ref 5–15)
BUN: 16 mg/dL (ref 8–23)
CO2: 24 mmol/L (ref 22–32)
Calcium: 9.3 mg/dL (ref 8.9–10.3)
Chloride: 102 mmol/L (ref 98–111)
Creatinine, Ser: 1.1 mg/dL — ABNORMAL HIGH (ref 0.44–1.00)
GFR, Estimated: 50 mL/min — ABNORMAL LOW (ref >60)
Glucose, Bld: 354 mg/dL — ABNORMAL HIGH (ref 70–99)
Potassium: 3.5 mmol/L (ref 3.5–5.1)
Sodium: 136 mmol/L (ref 135–145)
Total Bilirubin: 0.9 mg/dL (ref <1.2)
Total Protein: 6.9 g/dL (ref 6.5–8.1)

## 2023-12-02 LAB — MAGNESIUM: Magnesium: 1.6 mg/dL — ABNORMAL LOW (ref 1.7–2.4)

## 2023-12-02 NOTE — Progress Notes (Signed)
Patient presents today for Velcade and possible Retacrit injections per providers order.  Vital signs and labs reviewed by MD.  No Retacrit today per order parameters for Hgb 11.8.  Per Ellin Saba, holding tx today. She will have repeat myeloma labs today and a PET scan.  Patient will be scheduled to come back in 2 weeks. May have to change tx.

## 2023-12-03 ENCOUNTER — Other Ambulatory Visit: Payer: Self-pay

## 2023-12-03 LAB — KAPPA/LAMBDA LIGHT CHAINS
Kappa free light chain: 134 mg/L — ABNORMAL HIGH (ref 3.3–19.4)
Kappa, lambda light chain ratio: 8.59 — ABNORMAL HIGH (ref 0.26–1.65)
Lambda free light chains: 15.6 mg/L (ref 5.7–26.3)

## 2023-12-09 LAB — PROTEIN ELECTROPHORESIS, SERUM
A/G Ratio: 1.1 (ref 0.7–1.7)
Albumin ELP: 3.5 g/dL (ref 2.9–4.4)
Alpha-1-Globulin: 0.2 g/dL (ref 0.0–0.4)
Alpha-2-Globulin: 0.8 g/dL (ref 0.4–1.0)
Beta Globulin: 1.5 g/dL — ABNORMAL HIGH (ref 0.7–1.3)
Gamma Globulin: 0.8 g/dL (ref 0.4–1.8)
Globulin, Total: 3.3 g/dL (ref 2.2–3.9)
M-Spike, %: 0.5 g/dL — ABNORMAL HIGH
Total Protein ELP: 6.8 g/dL (ref 6.0–8.5)

## 2023-12-15 ENCOUNTER — Inpatient Hospital Stay: Payer: Medicare HMO

## 2023-12-16 ENCOUNTER — Encounter (HOSPITAL_COMMUNITY)
Admission: RE | Admit: 2023-12-16 | Discharge: 2023-12-16 | Disposition: A | Discharge status: Home / Self Care | Payer: Medicare HMO | Source: Ambulatory Visit | Attending: Hematology | Admitting: Hematology

## 2023-12-16 DIAGNOSIS — C9000 Multiple myeloma not having achieved remission: Secondary | ICD-10-CM | POA: Diagnosis present

## 2023-12-16 MED ORDER — FLUDEOXYGLUCOSE F - 18 (FDG) INJECTION
9.1000 | Freq: Once | INTRAVENOUS | Status: AC | PRN
  Administered 2023-12-16: 9.1 via INTRAVENOUS

## 2023-12-30 ENCOUNTER — Inpatient Hospital Stay: Payer: Medicare HMO

## 2023-12-30 ENCOUNTER — Inpatient Hospital Stay: Payer: Medicare HMO | Attending: Hematology

## 2023-12-30 ENCOUNTER — Inpatient Hospital Stay (HOSPITAL_BASED_OUTPATIENT_CLINIC_OR_DEPARTMENT_OTHER): Payer: Medicare HMO | Admitting: Hematology

## 2023-12-30 VITALS — Ht 62.0 in

## 2023-12-30 VITALS — BP 158/83 | HR 66 | Temp 97.8°F | Resp 18 | Wt 166.7 lb

## 2023-12-30 DIAGNOSIS — Z79899 Other long term (current) drug therapy: Secondary | ICD-10-CM | POA: Insufficient documentation

## 2023-12-30 DIAGNOSIS — R739 Hyperglycemia, unspecified: Secondary | ICD-10-CM | POA: Diagnosis not present

## 2023-12-30 DIAGNOSIS — Z5112 Encounter for antineoplastic immunotherapy: Secondary | ICD-10-CM | POA: Insufficient documentation

## 2023-12-30 DIAGNOSIS — C9000 Multiple myeloma not having achieved remission: Secondary | ICD-10-CM

## 2023-12-30 LAB — COMPREHENSIVE METABOLIC PANEL
ALT: 11 U/L (ref 0–44)
AST: 16 U/L (ref 15–41)
Albumin: 3.5 g/dL (ref 3.5–5.0)
Alkaline Phosphatase: 93 U/L (ref 38–126)
Anion gap: 7 (ref 5–15)
BUN: 11 mg/dL (ref 8–23)
CO2: 25 mmol/L (ref 22–32)
Calcium: 9.5 mg/dL (ref 8.9–10.3)
Chloride: 105 mmol/L (ref 98–111)
Creatinine, Ser: 1.1 mg/dL — ABNORMAL HIGH (ref 0.44–1.00)
GFR, Estimated: 50 mL/min — ABNORMAL LOW (ref >60)
Glucose, Bld: 235 mg/dL — ABNORMAL HIGH (ref 70–99)
Potassium: 3.5 mmol/L (ref 3.5–5.1)
Sodium: 137 mmol/L (ref 135–145)
Total Bilirubin: 0.8 mg/dL (ref 0.0–1.2)
Total Protein: 7.4 g/dL (ref 6.5–8.1)

## 2023-12-30 LAB — CBC WITH DIFFERENTIAL/PLATELET
Abs Immature Granulocytes: 0 10*3/uL (ref 0.00–0.07)
Basophils Absolute: 0 10*3/uL (ref 0.0–0.1)
Basophils Relative: 1 %
Eosinophils Absolute: 0.1 10*3/uL (ref 0.0–0.5)
Eosinophils Relative: 1 %
HCT: 35.7 % — ABNORMAL LOW (ref 36.0–46.0)
Hemoglobin: 11.7 g/dL — ABNORMAL LOW (ref 12.0–15.0)
Immature Granulocytes: 0 %
Lymphocytes Relative: 32 %
Lymphs Abs: 1.4 10*3/uL (ref 0.7–4.0)
MCH: 33.6 pg (ref 26.0–34.0)
MCHC: 32.8 g/dL (ref 30.0–36.0)
MCV: 102.6 fL — ABNORMAL HIGH (ref 80.0–100.0)
Monocytes Absolute: 0.3 10*3/uL (ref 0.1–1.0)
Monocytes Relative: 7 %
Neutro Abs: 2.6 10*3/uL (ref 1.7–7.7)
Neutrophils Relative %: 59 %
Platelets: 169 10*3/uL (ref 150–400)
RBC: 3.48 MIL/uL — ABNORMAL LOW (ref 3.87–5.11)
RDW: 12.9 % (ref 11.5–15.5)
WBC: 4.3 10*3/uL (ref 4.0–10.5)
nRBC: 0 % (ref 0.0–0.2)

## 2023-12-30 LAB — MAGNESIUM: Magnesium: 1.8 mg/dL (ref 1.7–2.4)

## 2023-12-30 MED ORDER — DEXAMETHASONE 4 MG PO TABS
20.0000 mg | ORAL_TABLET | Freq: Once | ORAL | Status: AC
Start: 2023-12-30 — End: 2023-12-30
  Administered 2023-12-30: 20 mg via ORAL
  Filled 2023-12-30: qty 5

## 2023-12-30 MED ORDER — PROCHLORPERAZINE MALEATE 10 MG PO TABS
10.0000 mg | ORAL_TABLET | Freq: Once | ORAL | Status: AC
  Administered 2023-12-30: 10 mg via ORAL
  Filled 2023-12-30: qty 1

## 2023-12-30 MED ORDER — BORTEZOMIB CHEMO SQ INJECTION 3.5 MG (2.5MG/ML)
1.3000 mg/m2 | Freq: Once | INTRAMUSCULAR | Status: AC
  Administered 2023-12-30: 2.25 mg via SUBCUTANEOUS
  Filled 2023-12-30: qty 0.9

## 2023-12-30 NOTE — Progress Notes (Signed)
 [facility] [address], [place] [address]    Clinic Day:  12/30/2023  Referring physician: Katrinka Aquas, MD  Patient Care Team: Katrinka Aquas, MD as PCP - General (Internal Medicine) Rogers Hai, MD as Medical Oncologist (Hematology)   ASSESSMENT & PLAN:   Assessment: 1.  IgA kappa plasma cell myeloma, stage I: -RVD started on 01/09/2018, held since 02/14/2020 due to mandible abscess. -Myeloma labs on 05/14/2020 showed progression with M spike of 0.5 g. -RVD started back on 05/21/2020. -Myeloma labs on 07/10/2020 shows M spike improved to 0.3 g from 0.6 g previously.  Free light chain ratio is 1.74 with kappa light chains 28.4. -Myeloma panel from 08/14/2020 shows M spike 0.4 g.  Kappa light chains are 24.8 and ratio is 2.23. - Revlimid  (20 mg 2 weeks on/1 week off) and Velcade  (3 weeks on/1 week off) were held after 02/26/2022 due to weight loss. - Velcade  maintenance every 2 weeks started on 04/13/2022.   2.  Osteomyelitis of the right mandible/dental abscess: -Finished IV ceftriaxone  on 04/03/2020.  Finished oral antibiotics. -We will hold Xgeva  indefinitely.    Plan: 1.  IgA kappa plasma cell myeloma, stage I: - She is tolerating Velcade  every other week very well.  She gets dexamethasone  20 mg on days of Velcade . - I have reviewed myeloma panel from 12/02/2023: M spike 0.5 g.  Free light chain ratio is gradually getting worse, at 8.59. - PET scan was done on 12/16/2023: Not reviewed by radiology yet. - I discussed that we should add Revlimid  10 mg 3 weeks on/1 week off to the current treatment to get better control of myeloma.  Previously she received 20 mg Revlimid  and we had to discontinue it because of weight loss.  We will send a prescription to our specialty pharmacy.  She will start taking it as soon as she receives it.  She will continue Velcade  every 2 weeks.  I will see her back in 6 weeks for follow-up.  Will do myeloma labs on same day.    2.  Severe hypokalemia: - Potassium is 3.5 today.  She will continue potassium 3 times daily.   3.  Osteomyelitis of the right mandible/dental abscess: - Bisphosphonates permanently on hold due to ONJ.   4.  Hypomagnesemia: - Magnesium  is 1.8 today.  Continue magnesium  3 times daily.   5.  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  Hemoglobin has been stable between 11 and 12.    No orders of the defined types were placed in this encounter.     LILLETTE Verneta SAUNDERS Teague,acting as a neurosurgeon for Hai Rogers, MD.,have documented all relevant documentation on the behalf of Hai Rogers, MD,as directed by  Hai Rogers, MD while in the presence of Hai Rogers, MD.  I, Hai Rogers MD, have reviewed the above documentation for accuracy and completeness, and I agree with the above.      Hai Rogers, MD   1/2/20251:36 PM  CHIEF COMPLAINT:   Diagnosis: multiple myeloma    Cancer Staging  No matching staging information was found for the patient.    Prior Therapy: RVD 01/09/18 through 02/14/20, 05/21/20 through 02/26/22   Current Therapy:  Maintenance Velcade  every other week.    HISTORY OF PRESENT ILLNESS:   Oncology History  Multiple myeloma not having achieved remission (HCC)  01/20/2018 Initial Diagnosis   Multiple myeloma not having achieved remission (HCC)   01/26/2018 - 09/01/2022 Chemotherapy   Patient is  on Treatment Plan : MYELOMA  RVD SQ (Bortezomib  d 1,8,15 ) q28d x 4 cycles     04/13/2022 -  Chemotherapy   Patient is on Treatment Plan : MYELOMA MAINTENANCE Bortezomib  SQ q14d        INTERVAL HISTORY:   Honore is a 83 y.o. female presenting to clinic today for follow up of multiple myeloma. She was last seen by me on 12/02/23.  Since her last visit, he underwent restaging PET on 12/16/23.   Today, she states that she is doing well overall. Her appetite level is at 100%. Her energy level is at 80%.  She is accompanied by her daughter. Her daughter states she visits the patient 5 times a week, though she will visit daily while Rechy is restarting chemotherapy.   PAST MEDICAL HISTORY:   Past Medical History: Past Medical History:  Diagnosis Date   Breast cancer (HCC)    left breast/ 2008/ surg/ rad tx   Coronary artery disease    Diabetes mellitus     Surgical History: Past Surgical History:  Procedure Laterality Date   ABDOMINAL HYSTERECTOMY     BREAST SURGERY     DEBRIDEMENT MANDIBLE N/A 02/22/2020   Procedure: INCISION AND DRAINAGE WITH DEBRIDEMENT MANDIBLE;  Surgeon: Joanette Soulier, DMD;  Location: [facility];  Service: Oral Surgery;  Laterality: N/A;   DEBRIDEMENT MANDIBLE Right 03/19/2021   Procedure: DEBRIDEMENT OF BONE RIGHT INTERIOR  MANDIBLE;  Surgeon: Joanette Soulier, DMD;  Location: [facility];  Service: Oral Surgery;  Laterality: Right;   EYE SURGERY  2021   cataract removals    TOOTH EXTRACTION N/A 02/22/2020   Procedure: DENTAL RESTORATION/EXTRACTIONS;  Surgeon: Joanette Soulier, DMD;  Location: [facility];  Service: Oral Surgery;  Laterality: N/A;  DENTAL KIT REQUESTED    Social History: Social History   Socioeconomic History   Marital status: Divorced    Spouse name: Not on file   Number of children: Not on file   Years of education: Not on file   Highest education level: Not on file  Occupational History   Not on file  Tobacco Use   Smoking status: Never   Smokeless tobacco: Never  Vaping Use   Vaping status: Never Used  Substance and Sexual Activity   Alcohol use: No   Drug use: No   Sexual activity: Yes    Birth control/protection: Surgical  Other Topics Concern   Not on file  Social History Narrative   Not on file   Social Drivers of Health   Financial Resource Strain: Low Risk  (12/09/2020)   Overall Financial Resource Strain (CARDIA)    Difficulty of Paying Living Expenses: Not hard at all  Food Insecurity: No Food Insecurity (12/09/2020)   Hunger Vital  Sign    Worried About Running Out of Food in the Last Year: Never true    Ran Out of Food in the Last Year: Never true  Transportation Needs: No Transportation Needs (12/09/2020)   PRAPARE - Administrator, Civil Service (Medical): No    Lack of Transportation (Non-Medical): No  Physical Activity: Inactive (12/09/2020)   Exercise Vital Sign    Days of Exercise per Week: 0 days    Minutes of Exercise per Session: 0 min  Stress: No Stress Concern Present (12/09/2020)   [employer] of Occupational Health - Occupational Stress Questionnaire    Feeling of Stress : Not at all  Social Connections: Moderately Isolated (12/09/2020)   Social Connection and Isolation Panel [  NHANES]    Frequency of Communication with Friends and Family: More than three times a week    Frequency of Social Gatherings with Friends and Family: More than three times a week    Attends Religious Services: More than 4 times per year    Active Member of [employer] or Organizations: No    Attends Banker Meetings: Never    Marital Status: Divorced  Catering Manager Violence: Not At Risk (12/09/2020)   Humiliation, Afraid, Rape, and Kick questionnaire    Fear of Current or Ex-Partner: No    Emotionally Abused: No    Physically Abused: No    Sexually Abused: No    Family History: Family History  Problem Relation Age of Onset   Obesity Sister     Current Medications:  Current Outpatient Medications:    acyclovir  (ZOVIRAX ) 400 MG tablet, Take 400 mg by mouth 2 (two) times daily., Disp: , Rfl:    aspirin  81 MG tablet, Take 1 tablet (81 mg total) by mouth daily with breakfast., Disp: 30 tablet, Rfl: 5   atorvastatin  (LIPITOR) 10 MG tablet, Take 10 mg by mouth daily., Disp: , Rfl:    bortezomib  IV (VELCADE ) 3.5 MG injection, 3.5 mg once a week. weekly, Disp: , Rfl:    chlorhexidine  (PERIDEX ) 0.12 % solution, Use as directed 15 mLs in the mouth or throat 3 (three) times daily., Disp: , Rfl:     cholecalciferol (VITAMIN D) 25 MCG (1000 UNIT) tablet, 1 capsule, Disp: , Rfl:    glucose blood (ACCU-CHEK AVIVA PLUS) test strip, CHECK BLOOD SUGAR ONCE DAILY, Disp: , Rfl:    GNP ASPIRIN  LOW DOSE 81 MG tablet, Take 81 mg by mouth daily., Disp: , Rfl:    magnesium  oxide (MAG-OX) 400 (240 Mg) MG tablet, TAKE ONE TABLET BY MOUTH THREE TIMES A DAY, Disp: 90 tablet, Rfl: 3   mirtazapine  (REMERON ) 15 MG tablet, Take 0.5 tablets (7.5 mg total) by mouth at bedtime. For appetite stimulation, Disp: 30 tablet, Rfl: 2   ondansetron  (ZOFRAN ) 8 MG tablet, , Disp: , Rfl:    Potassium Acetate  POWD, Take 20 mEq by mouth daily., Disp: 12000 g, Rfl: 5   potassium chloride  (KLOR-CON ) 20 MEQ packet, MIX AND TAKE ONE PACKET DAILY., Disp: 30 packet, Rfl: 3   Allergies: Allergies  Allergen Reactions   Other Other (See Comments)   Seasonal Ic [Cholestatin] Other (See Comments)    Sneezing, watery eyes   Motrin [Ibuprofen] Rash    REVIEW OF SYSTEMS:   Review of Systems  Constitutional:  Negative for chills, fatigue and fever.  HENT:   Negative for lump/mass, mouth sores, nosebleeds, sore throat and trouble swallowing.   Eyes:  Negative for eye problems.  Respiratory:  Negative for cough and shortness of breath.   Cardiovascular:  Negative for chest pain, leg swelling and palpitations.  Gastrointestinal:  Negative for abdominal pain, constipation, diarrhea, nausea and vomiting.  Genitourinary:  Negative for bladder incontinence, difficulty urinating, dysuria, frequency, hematuria and nocturia.   Musculoskeletal:  Negative for arthralgias, back pain, flank pain, myalgias and neck pain.  Skin:  Negative for itching and rash.  Neurological:  Negative for dizziness, headaches and numbness.  Hematological:  Does not bruise/bleed easily.  Psychiatric/Behavioral:  Negative for depression, sleep disturbance and suicidal ideas. The patient is not nervous/anxious.   All other systems reviewed and are negative.     VITALS:   Blood pressure (!) 158/83, pulse 66, temperature 97.8 F (36.6 C), temperature  source Oral, resp. rate 18, weight 166 lb 11.2 oz (75.6 kg), SpO2 100%.  Wt Readings from Last 3 Encounters:  12/30/23 166 lb 11.2 oz (75.6 kg)  12/02/23 167 lb 12.8 oz (76.1 kg)  11/17/23 172 lb 6.4 oz (78.2 kg)    Body mass index is 30.49 kg/m.  Performance status (ECOG): 1 - Symptomatic but completely ambulatory  PHYSICAL EXAM:   Physical Exam Vitals and nursing note reviewed. Exam conducted with a chaperone present.  Constitutional:      Appearance: Normal appearance.  Cardiovascular:     Rate and Rhythm: Normal rate and regular rhythm.     Pulses: Normal pulses.     Heart sounds: Normal heart sounds.  Pulmonary:     Effort: Pulmonary effort is normal.     Breath sounds: Normal breath sounds.  Abdominal:     Palpations: Abdomen is soft. There is no hepatomegaly, splenomegaly or mass.     Tenderness: There is no abdominal tenderness.  Musculoskeletal:     Right lower leg: No edema.     Left lower leg: No edema.  Lymphadenopathy:     Cervical: No cervical adenopathy.     Right cervical: No superficial, deep or posterior cervical adenopathy.    Left cervical: No superficial, deep or posterior cervical adenopathy.     Upper Body:     Right upper body: No supraclavicular or axillary adenopathy.     Left upper body: No supraclavicular or axillary adenopathy.  Neurological:     General: No focal deficit present.     Mental Status: She is alert and oriented to person, place, and time.  Psychiatric:        Mood and Affect: Mood normal.        Behavior: Behavior normal.     LABS:      Latest Ref Rng & Units 12/30/2023   11:57 AM 12/02/2023    8:45 AM 11/17/2023   12:44 PM  CBC  WBC 4.0 - 10.5 K/uL 4.3  5.3  5.1   Hemoglobin 12.0 - 15.0 g/dL 88.2  88.1  89.3   Hematocrit 36.0 - 46.0 % 35.7  35.7  33.4   Platelets 150 - 400 K/uL 169  169  197       Latest Ref Rng & Units  12/30/2023   11:57 AM 12/02/2023    8:45 AM 11/17/2023   12:44 PM  CMP  Glucose 70 - 99 mg/dL 764  645  719   BUN 8 - 23 mg/dL 11  16  15    Creatinine 0.44 - 1.00 mg/dL 8.89  8.89  8.76   Sodium 135 - 145 mmol/L 137  136  138   Potassium 3.5 - 5.1 mmol/L 3.5  3.5  3.9   Chloride 98 - 111 mmol/L 105  102  103   CO2 22 - 32 mmol/L 25  24  27    Calcium  8.9 - 10.3 mg/dL 9.5  9.3  9.4   Total Protein 6.5 - 8.1 g/dL 7.4  6.9  6.9   Total Bilirubin 0.0 - 1.2 mg/dL 0.8  0.9  0.7   Alkaline Phos 38 - 126 U/L 93  98  86   AST 15 - 41 U/L 16  17  14    ALT 0 - 44 U/L 11  15  11       No results found for: CEA1, CEA / No results found for: CEA1, CEA No results found for: PSA1 No results found for:  RJW800 No results found for: RJW874  Lab Results  Component Value Date   TOTALPROTELP 6.8 12/02/2023   ALBUMINELP 3.5 12/02/2023   A1GS 0.2 12/02/2023   A2GS 0.8 12/02/2023   BETS 1.5 (H) 12/02/2023   GAMS 0.8 12/02/2023   MSPIKE 0.5 (H) 12/02/2023   SPEI Comment 12/02/2023   Lab Results  Component Value Date   TIBC 137 (L) 05/20/2023   TIBC 117 (L) 03/24/2023   TIBC 107 (L) 01/26/2023   FERRITIN 267 05/20/2023   FERRITIN 257 03/24/2023   FERRITIN 339 (H) 01/26/2023   IRONPCTSAT 33 (H) 05/20/2023   IRONPCTSAT 44 (H) 03/24/2023   IRONPCTSAT 50 (H) 01/26/2023   Lab Results  Component Value Date   LDH 137 05/20/2023   LDH 134 03/24/2023   LDH 125 01/26/2023     STUDIES:   No results found.

## 2023-12-30 NOTE — Patient Instructions (Signed)

## 2023-12-30 NOTE — Progress Notes (Signed)
 Patient presents today for Velcade  infusion. Patient is in satisfactory condition with no new complaints voiced.  Vital signs are stable.  Labs reviewed by Dr. Rogers during the office visit and all labs are within treatment parameters. No Retacrit  injection needed today, due to hemoglobin of 11.7  We will proceed with treatment per MD orders.   Treatment given today per MD orders. Tolerated infusion without adverse affects. Vital signs stable. No complaints at this time. Discharged from clinic ambulatory in stable condition. Alert and oriented x 3. F/U with [facility] as scheduled.

## 2023-12-30 NOTE — Progress Notes (Signed)
 Patient has been examined by Dr. Ellin Saba. Vital signs and labs have been reviewed by MD - ANC, Creatinine, LFTs, hemoglobin, and platelets are within treatment parameters per M.D. - pt may proceed with treatment.  Primary RN and pharmacy notified.

## 2023-12-30 NOTE — Patient Instructions (Signed)
 [facility] [place] - A DEPT OF [facility]  Discharge Instructions: Thank you for choosing [facility] to provide your oncology and hematology care.  If you have a lab appointment with the Cancer Center - please note that after April 8th, 2024, all labs will be drawn in the cancer center.  You do not have to check in or register with the main entrance as you have in the past but will complete your check-in in the cancer center.  Wear comfortable clothing and clothing appropriate for easy access to any Portacath or PICC line.   We strive to give you quality time with your provider. You may need to reschedule your appointment if you arrive late (15 or more minutes).  Arriving late affects you and other patients whose appointments are after yours.  Also, if you miss three or more appointments without notifying the office, you may be dismissed from the clinic at the provider's discretion.      For prescription refill requests, have your pharmacy contact our office and allow 72 hours for refills to be completed.    Today you received the following chemotherapy and/or immunotherapy agents Velcade      To help prevent nausea and vomiting after your treatment, we encourage you to take your nausea medication as directed.  Bortezomib Injection What is this medication? BORTEZOMIB (bor TEZ oh mib) treats lymphoma. It may also be used to treat multiple myeloma, a type of bone marrow cancer. It works by blocking a protein that causes cancer cells to grow and multiply. This helps to slow or stop the spread of cancer cells. This medicine may be used for other purposes; ask your health care provider or pharmacist if you have questions. COMMON BRAND NAME(S): Velcade What should I tell my care team before I take this medication? They need to know if you have any of these conditions: Dehydration Diabetes Heart disease Liver disease Tingling of the fingers or toes or other  nerve disorder An unusual or allergic reaction to bortezomib, other medications, foods, dyes, or preservatives If you or your partner are pregnant or trying to get pregnant Breastfeeding How should I use this medication? This medication is injected into a vein or under the skin. It is given by your care team in a hospital or clinic setting. Talk to your care team about the use of this medication in children. Special care may be needed. Overdosage: If you think you have taken too much of this medicine contact a poison control center or emergency room at once. NOTE: This medicine is only for you. Do not share this medicine with others. What if I miss a dose? Keep appointments for follow-up doses. It is important not to miss your dose. Call your care team if you are unable to keep an appointment. What may interact with this medication? Ketoconazole Rifampin This list may not describe all possible interactions. Give your health care provider a list of all the medicines, herbs, non-prescription drugs, or dietary supplements you use. Also tell them if you smoke, drink alcohol, or use illegal drugs. Some items may interact with your medicine. What should I watch for while using this medication? Your condition will be monitored carefully while you are receiving this medication. You may need blood work while taking this medication. This medication may affect your coordination, reaction time, or judgment. Do not drive or operate machinery until you know how this medication affects you. Sit up or stand  slowly to reduce the risk of dizzy or fainting spells. Drinking alcohol with this medication can increase the risk of these side effects. This medication may increase your risk of getting an infection. Call your care team for advice if you get a fever, chills, sore throat, or other symptoms of a cold or flu. Do not treat yourself. Try to avoid being around people who are sick. Check with your care team if you  have severe diarrhea, nausea, and vomiting, or if you sweat a lot. The loss of too much body fluid may make it dangerous for you to take this medication. Talk to your care team if you may be pregnant. Serious birth defects can occur if you take this medication during pregnancy and for 7 months after the last dose. You will need a negative pregnancy test before starting this medication. Contraception is recommended while taking this medication and for 7 months after the last dose. Your care team can help you find the option that works for you. If your partner can get pregnant, use a condom during sex while taking this medication and for 4 months after the last dose. Do not breastfeed while taking this medication and for 2 months after the last dose. This medication may cause infertility. Talk to your care team if you are concerned about your fertility. What side effects may I notice from receiving this medication? Side effects that you should report to your care team as soon as possible: Allergic reactions--skin rash, itching, hives, swelling of the face, lips, tongue, or throat Bleeding--bloody or black, tar-like stools, vomiting blood or brown material that looks like coffee grounds, red or dark brown urine, small red or purple spots on skin, unusual bruising or bleeding Bleeding in the brain--severe headache, stiff neck, confusion, dizziness, change in vision, numbness or weakness of the face, arm, or leg, trouble speaking, trouble walking, vomiting Bowel blockage--stomach cramping, unable to have a bowel movement or pass gas, loss of appetite, vomiting Heart failure--shortness of breath, swelling of the ankles, feet, or hands, sudden weight gain, unusual weakness or fatigue Infection--fever, chills, cough, sore throat, wounds that don't heal, pain or trouble when passing urine, general feeling of discomfort or being unwell Liver injury--right upper belly pain, loss of appetite, nausea, light-colored  stool, dark yellow or brown urine, yellowing skin or eyes, unusual weakness or fatigue Low blood pressure--dizziness, feeling faint or lightheaded, blurry vision Lung injury--shortness of breath or trouble breathing, cough, spitting up blood, chest pain, fever Pain, tingling, or numbness in the hands or feet Severe or prolonged diarrhea Stomach pain, bloody diarrhea, pale skin, unusual weakness or fatigue, decrease in the amount of urine, which may be signs of hemolytic uremic syndrome Sudden and severe headache, confusion, change in vision, seizures, which may be signs of posterior reversible encephalopathy syndrome (PRES) TTP--purple spots on the skin or inside the mouth, pale skin, yellowing skin or eyes, unusual weakness or fatigue, fever, fast or irregular heartbeat, confusion, change in vision, trouble speaking, trouble walking Tumor lysis syndrome (TLS)--nausea, vomiting, diarrhea, decrease in the amount of urine, dark urine, unusual weakness or fatigue, confusion, muscle pain or cramps, fast or irregular heartbeat, joint pain Side effects that usually do not require medical attention (report to your care team if they continue or are bothersome): Constipation Diarrhea Fatigue Loss of appetite Nausea This list may not describe all possible side effects. Call your doctor for medical advice about side effects. You may report side effects to FDA at 1-800-FDA-1088. Where should  I keep my medication? This medication is given in a hospital or clinic. It will not be stored at home. NOTE: This sheet is a summary. It may not cover all possible information. If you have questions about this medicine, talk to your doctor, pharmacist, or health care provider.  2024 Elsevier/Gold Standard (2022-05-19 00:00:00)   BELOW ARE SYMPTOMS THAT SHOULD BE REPORTED IMMEDIATELY: *FEVER GREATER THAN 100.4 F (38 C) OR HIGHER *CHILLS OR SWEATING *NAUSEA AND VOMITING THAT IS NOT CONTROLLED WITH YOUR NAUSEA  MEDICATION *UNUSUAL SHORTNESS OF BREATH *UNUSUAL BRUISING OR BLEEDING *URINARY PROBLEMS (pain or burning when urinating, or frequent urination) *BOWEL PROBLEMS (unusual diarrhea, constipation, pain near the anus) TENDERNESS IN MOUTH AND THROAT WITH OR WITHOUT PRESENCE OF ULCERS (sore throat, sores in mouth, or a toothache) UNUSUAL RASH, SWELLING OR PAIN  UNUSUAL VAGINAL DISCHARGE OR ITCHING   Items with * indicate a potential emergency and should be followed up as soon as possible or go to the Emergency Department if any problems should occur.  Please show the CHEMOTHERAPY ALERT CARD or IMMUNOTHERAPY ALERT CARD at check-in to the Emergency Department and triage nurse.  Should you have questions after your visit or need to cancel or reschedule your appointment, please contact [facility] [place] - A DEPT OF [facility] [phone]  and follow the prompts.  Office hours are 8:00 a.m. to 4:30 p.m. Monday - Friday. Please note that voicemails left after 4:00 p.m. may not be returned until the following business day.  We are closed weekends and major holidays. You have access to a nurse at all times for urgent questions. Please call the main number to the clinic [phone] and follow the prompts.  For any non-urgent questions, you may also contact your provider using MyChart. We now offer e-Visits for anyone 24 and older to request care online for non-urgent symptoms. For details visit [email or web address].   Also download the MyChart app! Go to the app store, search "MyChart", open the app, select [place], and log in with your MyChart username and password.

## 2023-12-31 ENCOUNTER — Encounter (HOSPITAL_COMMUNITY): Payer: Self-pay | Admitting: Hematology

## 2023-12-31 ENCOUNTER — Other Ambulatory Visit: Payer: Self-pay

## 2023-12-31 ENCOUNTER — Telehealth: Payer: Self-pay | Admitting: Pharmacist

## 2023-12-31 ENCOUNTER — Other Ambulatory Visit (HOSPITAL_COMMUNITY): Payer: Self-pay

## 2023-12-31 ENCOUNTER — Encounter: Payer: Self-pay | Admitting: Hematology

## 2023-12-31 DIAGNOSIS — C9000 Multiple myeloma not having achieved remission: Secondary | ICD-10-CM

## 2023-12-31 MED ORDER — LENALIDOMIDE 10 MG PO CAPS: 10.0000 mg | ORAL_CAPSULE | Freq: Every day | ORAL | 0 refills | Status: DC

## 2023-12-31 NOTE — Telephone Encounter (Signed)
 Clinical Pharmacist Practitioner Encounter   New authorization- patient restarting medication after a break   Prior Authorization for lenalidomide  has been approved.     PA# [id number] Effective dates: 12/29/23 through 12/27/24  Rx redirected to [facility] due to insurance restrictions.   Copay $0   Oral Oncology Clinic will continue to follow.   Rhen Kawecki N. Quadry Kampa, PharmD, BCPS. [facility] Hematology/Oncology Clinical Pharmacist [facility] [phone]  12/31/2023 12:42 PM

## 2023-12-31 NOTE — Telephone Encounter (Signed)
 Clinical Pharmacist Practitioner Encounter  New authorization- patient restarting medication after a break  Received notification from [facility] that prior authorization for lenalidomide  is required.   PA submitted on CMM Key [id number] Status is pending   O[facility] Oncology Clinic will continue to follow.    Brittany Archer N. Cathryn Gallery, PharmD, BCPS, [facility] Hematology/Oncology Clinical Pharmacist [facility] [phone]  12/31/2023 12:35 PM

## 2024-01-06 ENCOUNTER — Other Ambulatory Visit: Payer: Self-pay | Admitting: Hematology

## 2024-01-12 ENCOUNTER — Encounter: Payer: Self-pay | Admitting: Hematology

## 2024-01-12 ENCOUNTER — Encounter (HOSPITAL_COMMUNITY): Payer: Self-pay | Admitting: Hematology

## 2024-01-12 NOTE — Telephone Encounter (Signed)
 Called [facility] to check on status of Lenalidomide . Informed by representative that medication was delivered on Tuesday, 01/11/24.    Brittany Archer, CPhT Oncology Pharmacy Patient Advocate  [facility]  [phone] (phone) [phone] (fax) 01/12/2024 2:14 PM

## 2024-01-13 ENCOUNTER — Other Ambulatory Visit: Payer: Self-pay

## 2024-01-13 ENCOUNTER — Encounter (HOSPITAL_COMMUNITY): Payer: Self-pay | Admitting: Hematology

## 2024-01-13 ENCOUNTER — Encounter: Payer: Self-pay | Admitting: Hematology

## 2024-01-13 ENCOUNTER — Inpatient Hospital Stay: Payer: Medicare HMO

## 2024-01-13 VITALS — BP 135/70 | HR 84 | Temp 98.4°F | Resp 18 | Wt 164.2 lb

## 2024-01-13 DIAGNOSIS — C9000 Multiple myeloma not having achieved remission: Secondary | ICD-10-CM

## 2024-01-13 DIAGNOSIS — Z5112 Encounter for antineoplastic immunotherapy: Secondary | ICD-10-CM | POA: Diagnosis not present

## 2024-01-13 DIAGNOSIS — R739 Hyperglycemia, unspecified: Secondary | ICD-10-CM

## 2024-01-13 LAB — CBC WITH DIFFERENTIAL/PLATELET
Abs Immature Granulocytes: 0.04 10*3/uL (ref 0.00–0.07)
Basophils Absolute: 0 10*3/uL (ref 0.0–0.1)
Basophils Relative: 1 %
Eosinophils Absolute: 0.1 10*3/uL (ref 0.0–0.5)
Eosinophils Relative: 2 %
HCT: 35.3 % — ABNORMAL LOW (ref 36.0–46.0)
Hemoglobin: 11.7 g/dL — ABNORMAL LOW (ref 12.0–15.0)
Immature Granulocytes: 1 %
Lymphocytes Relative: 15 %
Lymphs Abs: 0.9 10*3/uL (ref 0.7–4.0)
MCH: 33.3 pg (ref 26.0–34.0)
MCHC: 33.1 g/dL (ref 30.0–36.0)
MCV: 100.6 fL — ABNORMAL HIGH (ref 80.0–100.0)
Monocytes Absolute: 0.4 10*3/uL (ref 0.1–1.0)
Monocytes Relative: 6 %
Neutro Abs: 4.8 10*3/uL (ref 1.7–7.7)
Neutrophils Relative %: 75 %
Platelets: 206 10*3/uL (ref 150–400)
RBC: 3.51 MIL/uL — ABNORMAL LOW (ref 3.87–5.11)
RDW: 12.6 % (ref 11.5–15.5)
WBC: 6.3 10*3/uL (ref 4.0–10.5)
nRBC: 0 % (ref 0.0–0.2)

## 2024-01-13 LAB — COMPREHENSIVE METABOLIC PANEL
ALT: 18 U/L (ref 0–44)
AST: 18 U/L (ref 15–41)
Albumin: 3.4 g/dL — ABNORMAL LOW (ref 3.5–5.0)
Alkaline Phosphatase: 117 U/L (ref 38–126)
Anion gap: 10 (ref 5–15)
BUN: 10 mg/dL (ref 8–23)
CO2: 28 mmol/L (ref 22–32)
Calcium: 9.5 mg/dL (ref 8.9–10.3)
Chloride: 99 mmol/L (ref 98–111)
Creatinine, Ser: 1.08 mg/dL — ABNORMAL HIGH (ref 0.44–1.00)
GFR, Estimated: 51 mL/min — ABNORMAL LOW (ref >60)
Glucose, Bld: 232 mg/dL — ABNORMAL HIGH (ref 70–99)
Potassium: 3.9 mmol/L (ref 3.5–5.1)
Sodium: 137 mmol/L (ref 135–145)
Total Bilirubin: 0.8 mg/dL (ref 0.0–1.2)
Total Protein: 7.3 g/dL (ref 6.5–8.1)

## 2024-01-13 LAB — MAGNESIUM: Magnesium: 1.7 mg/dL (ref 1.7–2.4)

## 2024-01-13 MED ORDER — DEXAMETHASONE 4 MG PO TABS
20.0000 mg | ORAL_TABLET | Freq: Once | ORAL | Status: AC
Start: 2024-01-13 — End: 2024-01-13
  Administered 2024-01-13: 20 mg via ORAL
  Filled 2024-01-13: qty 5

## 2024-01-13 MED ORDER — PROCHLORPERAZINE MALEATE 10 MG PO TABS
10.0000 mg | ORAL_TABLET | Freq: Once | ORAL | Status: AC
  Administered 2024-01-13: 10 mg via ORAL
  Filled 2024-01-13: qty 1

## 2024-01-13 MED ORDER — BORTEZOMIB CHEMO SQ INJECTION 3.5 MG (2.5MG/ML)
1.3000 mg/m2 | Freq: Once | INTRAMUSCULAR | Status: AC
Start: 2024-01-13 — End: 2024-01-13
  Administered 2024-01-13: 2.25 mg via SUBCUTANEOUS
  Filled 2024-01-13: qty 0.9

## 2024-01-13 MED ORDER — INSULIN ASPART 100 UNIT/ML IJ SOLN
10.0000 [IU] | Freq: Once | INTRAMUSCULAR | Status: AC
Start: 2024-01-13 — End: 2024-01-13
  Administered 2024-01-13: 10 [IU] via SUBCUTANEOUS
  Filled 2024-01-13: qty 0.1

## 2024-01-13 NOTE — Progress Notes (Signed)
Patient presents today for velcade, labs within treatment parameters. Hemoglobin 11.7 no procrit needed today per parameters. Blood sugar 232, 10 units of insulin orderedand administered per standing orders.  Patient tolerated Velcade injection with no complaints voiced. Lab work reviewed. See MAR for details. Injection site clean and dry with no bruising or swelling noted. Patient stable during and after injection. Band aid applied. VSS. Patient left in satisfactory condition with no s/s of distress noted.

## 2024-01-20 ENCOUNTER — Encounter: Payer: Self-pay | Admitting: Hematology

## 2024-01-20 ENCOUNTER — Encounter (HOSPITAL_COMMUNITY): Payer: Self-pay | Admitting: Hematology

## 2024-01-25 ENCOUNTER — Other Ambulatory Visit: Payer: Self-pay | Admitting: Hematology

## 2024-01-27 ENCOUNTER — Inpatient Hospital Stay: Payer: Medicare HMO

## 2024-01-27 VITALS — BP 146/58 | HR 76 | Temp 97.3°F | Resp 19 | Wt 165.0 lb

## 2024-01-27 DIAGNOSIS — C9000 Multiple myeloma not having achieved remission: Secondary | ICD-10-CM

## 2024-01-27 DIAGNOSIS — Z5112 Encounter for antineoplastic immunotherapy: Secondary | ICD-10-CM | POA: Diagnosis not present

## 2024-01-27 LAB — COMPREHENSIVE METABOLIC PANEL
ALT: 14 U/L (ref 0–44)
AST: 15 U/L (ref 15–41)
Albumin: 3.2 g/dL — ABNORMAL LOW (ref 3.5–5.0)
Alkaline Phosphatase: 102 U/L (ref 38–126)
Anion gap: 8 (ref 5–15)
BUN: 15 mg/dL (ref 8–23)
CO2: 27 mmol/L (ref 22–32)
Calcium: 9.5 mg/dL (ref 8.9–10.3)
Chloride: 104 mmol/L (ref 98–111)
Creatinine, Ser: 1.1 mg/dL — ABNORMAL HIGH (ref 0.44–1.00)
GFR, Estimated: 50 mL/min — ABNORMAL LOW (ref >60)
Glucose, Bld: 161 mg/dL — ABNORMAL HIGH (ref 70–99)
Potassium: 4 mmol/L (ref 3.5–5.1)
Sodium: 139 mmol/L (ref 135–145)
Total Bilirubin: 0.7 mg/dL (ref 0.0–1.2)
Total Protein: 7.2 g/dL (ref 6.5–8.1)

## 2024-01-27 LAB — CBC WITH DIFFERENTIAL/PLATELET
Abs Immature Granulocytes: 0.01 10*3/uL (ref 0.00–0.07)
Basophils Absolute: 0 10*3/uL (ref 0.0–0.1)
Basophils Relative: 1 %
Eosinophils Absolute: 0.1 10*3/uL (ref 0.0–0.5)
Eosinophils Relative: 2 %
HCT: 33.1 % — ABNORMAL LOW (ref 36.0–46.0)
Hemoglobin: 10.8 g/dL — ABNORMAL LOW (ref 12.0–15.0)
Immature Granulocytes: 0 %
Lymphocytes Relative: 24 %
Lymphs Abs: 1.5 10*3/uL (ref 0.7–4.0)
MCH: 33.3 pg (ref 26.0–34.0)
MCHC: 32.6 g/dL (ref 30.0–36.0)
MCV: 102.2 fL — ABNORMAL HIGH (ref 80.0–100.0)
Monocytes Absolute: 0.6 10*3/uL (ref 0.1–1.0)
Monocytes Relative: 10 %
Neutro Abs: 3.8 10*3/uL (ref 1.7–7.7)
Neutrophils Relative %: 63 %
Platelets: 206 10*3/uL (ref 150–400)
RBC: 3.24 MIL/uL — ABNORMAL LOW (ref 3.87–5.11)
RDW: 12.9 % (ref 11.5–15.5)
WBC: 6 10*3/uL (ref 4.0–10.5)
nRBC: 0 % (ref 0.0–0.2)

## 2024-01-27 LAB — MAGNESIUM: Magnesium: 2 mg/dL (ref 1.7–2.4)

## 2024-01-27 MED ORDER — PROCHLORPERAZINE MALEATE 10 MG PO TABS
10.0000 mg | ORAL_TABLET | Freq: Once | ORAL | Status: AC
  Administered 2024-01-27: 10 mg via ORAL
  Filled 2024-01-27: qty 1

## 2024-01-27 MED ORDER — DEXAMETHASONE 4 MG PO TABS
20.0000 mg | ORAL_TABLET | Freq: Once | ORAL | Status: AC
  Administered 2024-01-27: 20 mg via ORAL
  Filled 2024-01-27: qty 5

## 2024-01-27 MED ORDER — BORTEZOMIB CHEMO SQ INJECTION 3.5 MG (2.5MG/ML)
1.3000 mg/m2 | Freq: Once | INTRAMUSCULAR | Status: AC
  Administered 2024-01-27: 2.25 mg via SUBCUTANEOUS
  Filled 2024-01-27: qty 0.9

## 2024-01-27 NOTE — Patient Instructions (Signed)
[facility] [place] - A DEPT OF [facility]  Discharge Instructions: Thank you for choosing [facility] to provide your oncology and hematology care.  If you have a lab appointment with the Cancer Center - please note that after April 8th, 2024, all labs will be drawn in the cancer center.  You do not have to check in or register with the main entrance as you have in the past but will complete your check-in in the cancer center.  Wear comfortable clothing and clothing appropriate for easy access to any Portacath or PICC line.   We strive to give you quality time with your provider. You may need to reschedule your appointment if you arrive late (15 or more minutes).  Arriving late affects you and other patients whose appointments are after yours.  Also, if you miss three or more appointments without notifying the office, you may be dismissed from the clinic at the provider's discretion.      For prescription refill requests, have your pharmacy contact our office and allow 72 hours for refills to be completed.    Today you received the following chemotherapy and/or immunotherapy agents velcade     To help prevent nausea and vomiting after your treatment, we encourage you to take your nausea medication as directed.  BELOW ARE SYMPTOMS THAT SHOULD BE REPORTED IMMEDIATELY: *FEVER GREATER THAN 100.4 F (38 C) OR HIGHER *CHILLS OR SWEATING *NAUSEA AND VOMITING THAT IS NOT CONTROLLED WITH YOUR NAUSEA MEDICATION *UNUSUAL SHORTNESS OF BREATH *UNUSUAL BRUISING OR BLEEDING *URINARY PROBLEMS (pain or burning when urinating, or frequent urination) *BOWEL PROBLEMS (unusual diarrhea, constipation, pain near the anus) TENDERNESS IN MOUTH AND THROAT WITH OR WITHOUT PRESENCE OF ULCERS (sore throat, sores in mouth, or a toothache) UNUSUAL RASH, SWELLING OR PAIN  UNUSUAL VAGINAL DISCHARGE OR ITCHING   Items with * indicate a potential emergency and should be followed up as  soon as possible or go to the Emergency Department if any problems should occur.  Please show the CHEMOTHERAPY ALERT CARD or IMMUNOTHERAPY ALERT CARD at check-in to the Emergency Department and triage nurse.  Should you have questions after your visit or need to cancel or reschedule your appointment, please contact [facility] [place] - A DEPT OF [facility] [phone]  and follow the prompts.  Office hours are 8:00 a.m. to 4:30 p.m. Monday - Friday. Please note that voicemails left after 4:00 p.m. may not be returned until the following business day.  We are closed weekends and major holidays. You have access to a nurse at all times for urgent questions. Please call the main number to the clinic [phone] and follow the prompts.  For any non-urgent questions, you may also contact your provider using MyChart. We now offer e-Visits for anyone 62 and older to request care online for non-urgent symptoms. For details visit [email or web address].   Also download the MyChart app! Go to the app store, search "MyChart", open the app, select [facility], and log in with your MyChart username and password.

## 2024-01-27 NOTE — Progress Notes (Signed)
Patient's Hgb 10.8. Hold retacrit per MD orders. Patient tolerated Velcade injection with no complaints voiced.  Site clean and dry with no bruising or swelling noted at site.  See MAR for details.  Band aid applied.  Patient stable during and after injection.  Vss with discharge and left in satisfactory condition with no s/s of distress noted.   [employer]

## 2024-01-28 ENCOUNTER — Other Ambulatory Visit: Payer: Self-pay

## 2024-01-30 ENCOUNTER — Other Ambulatory Visit: Payer: Self-pay | Admitting: Hematology

## 2024-01-30 DIAGNOSIS — C9000 Multiple myeloma not having achieved remission: Secondary | ICD-10-CM

## 2024-01-31 ENCOUNTER — Encounter: Payer: Self-pay | Admitting: Hematology

## 2024-01-31 ENCOUNTER — Encounter (HOSPITAL_COMMUNITY): Payer: Self-pay | Admitting: Hematology

## 2024-01-31 ENCOUNTER — Other Ambulatory Visit: Payer: Self-pay

## 2024-01-31 DIAGNOSIS — C9000 Multiple myeloma not having achieved remission: Secondary | ICD-10-CM

## 2024-01-31 MED ORDER — LENALIDOMIDE 10 MG PO CAPS: 10.0000 mg | ORAL_CAPSULE | Freq: Every day | ORAL | 0 refills | Status: DC

## 2024-01-31 NOTE — Telephone Encounter (Signed)
Chart reviewed. Revlimid refilled per last office note with Dr. Katragadda.  

## 2024-02-09 ENCOUNTER — Other Ambulatory Visit: Payer: Self-pay

## 2024-02-09 DIAGNOSIS — C9000 Multiple myeloma not having achieved remission: Secondary | ICD-10-CM

## 2024-02-10 ENCOUNTER — Inpatient Hospital Stay: Payer: Medicare HMO

## 2024-02-10 ENCOUNTER — Inpatient Hospital Stay: Payer: Medicare HMO | Attending: Hematology

## 2024-02-10 ENCOUNTER — Inpatient Hospital Stay: Payer: Medicare HMO | Admitting: Hematology

## 2024-02-10 ENCOUNTER — Other Ambulatory Visit (HOSPITAL_COMMUNITY): Payer: Self-pay | Admitting: Hematology

## 2024-02-10 ENCOUNTER — Inpatient Hospital Stay (HOSPITAL_BASED_OUTPATIENT_CLINIC_OR_DEPARTMENT_OTHER): Payer: Medicare HMO | Admitting: Hematology

## 2024-02-10 VITALS — BP 181/85 | HR 71 | Temp 99.2°F | Resp 16 | Wt 164.5 lb

## 2024-02-10 DIAGNOSIS — Z79899 Other long term (current) drug therapy: Secondary | ICD-10-CM | POA: Diagnosis not present

## 2024-02-10 DIAGNOSIS — C9000 Multiple myeloma not having achieved remission: Secondary | ICD-10-CM

## 2024-02-10 DIAGNOSIS — E538 Deficiency of other specified B group vitamins: Secondary | ICD-10-CM

## 2024-02-10 DIAGNOSIS — Z5112 Encounter for antineoplastic immunotherapy: Secondary | ICD-10-CM | POA: Insufficient documentation

## 2024-02-10 DIAGNOSIS — R9389 Abnormal findings on diagnostic imaging of other specified body structures: Secondary | ICD-10-CM | POA: Diagnosis not present

## 2024-02-10 DIAGNOSIS — N649 Disorder of breast, unspecified: Secondary | ICD-10-CM

## 2024-02-10 LAB — CBC WITH DIFFERENTIAL/PLATELET
Abs Immature Granulocytes: 0.01 10*3/uL (ref 0.00–0.07)
Basophils Absolute: 0 10*3/uL (ref 0.0–0.1)
Basophils Relative: 1 %
Eosinophils Absolute: 0.1 10*3/uL (ref 0.0–0.5)
Eosinophils Relative: 1 %
HCT: 33.2 % — ABNORMAL LOW (ref 36.0–46.0)
Hemoglobin: 10.5 g/dL — ABNORMAL LOW (ref 12.0–15.0)
Immature Granulocytes: 0 %
Lymphocytes Relative: 28 %
Lymphs Abs: 1.3 10*3/uL (ref 0.7–4.0)
MCH: 32.3 pg (ref 26.0–34.0)
MCHC: 31.6 g/dL (ref 30.0–36.0)
MCV: 102.2 fL — ABNORMAL HIGH (ref 80.0–100.0)
Monocytes Absolute: 0.6 10*3/uL (ref 0.1–1.0)
Monocytes Relative: 13 %
Neutro Abs: 2.7 10*3/uL (ref 1.7–7.7)
Neutrophils Relative %: 57 %
Platelets: 213 10*3/uL (ref 150–400)
RBC: 3.25 MIL/uL — ABNORMAL LOW (ref 3.87–5.11)
RDW: 13.4 % (ref 11.5–15.5)
WBC: 4.7 10*3/uL (ref 4.0–10.5)
nRBC: 0 % (ref 0.0–0.2)

## 2024-02-10 LAB — COMPREHENSIVE METABOLIC PANEL
ALT: 14 U/L (ref 0–44)
AST: 17 U/L (ref 15–41)
Albumin: 3.3 g/dL — ABNORMAL LOW (ref 3.5–5.0)
Alkaline Phosphatase: 118 U/L (ref 38–126)
Anion gap: 9 (ref 5–15)
BUN: 13 mg/dL (ref 8–23)
CO2: 24 mmol/L (ref 22–32)
Calcium: 9.2 mg/dL (ref 8.9–10.3)
Chloride: 104 mmol/L (ref 98–111)
Creatinine, Ser: 1.02 mg/dL — ABNORMAL HIGH (ref 0.44–1.00)
GFR, Estimated: 55 mL/min — ABNORMAL LOW (ref >60)
Glucose, Bld: 249 mg/dL — ABNORMAL HIGH (ref 70–99)
Potassium: 3.3 mmol/L — ABNORMAL LOW (ref 3.5–5.1)
Sodium: 137 mmol/L (ref 135–145)
Total Bilirubin: 0.6 mg/dL (ref 0.0–1.2)
Total Protein: 7 g/dL (ref 6.5–8.1)

## 2024-02-10 LAB — MAGNESIUM: Magnesium: 1.8 mg/dL (ref 1.7–2.4)

## 2024-02-10 MED ORDER — DEXAMETHASONE 4 MG PO TABS
20.0000 mg | ORAL_TABLET | Freq: Once | ORAL | Status: AC
  Administered 2024-02-10: 20 mg via ORAL
  Filled 2024-02-10: qty 5

## 2024-02-10 MED ORDER — BORTEZOMIB CHEMO SQ INJECTION 3.5 MG (2.5MG/ML)
1.3000 mg/m2 | Freq: Once | INTRAMUSCULAR | Status: AC
  Administered 2024-02-10: 2.25 mg via SUBCUTANEOUS
  Filled 2024-02-10: qty 0.9

## 2024-02-10 MED ORDER — POTASSIUM CHLORIDE CRYS ER 20 MEQ PO TBCR
40.0000 meq | EXTENDED_RELEASE_TABLET | ORAL | Status: DC
  Filled 2024-02-10: qty 2

## 2024-02-10 MED ORDER — POTASSIUM CHLORIDE CRYS ER 20 MEQ PO TBCR
40.0000 meq | EXTENDED_RELEASE_TABLET | Freq: Once | ORAL | Status: AC
Start: 2024-02-10 — End: 2024-02-10
  Administered 2024-02-10: 40 meq via ORAL

## 2024-02-10 MED ORDER — PROCHLORPERAZINE MALEATE 10 MG PO TABS
10.0000 mg | ORAL_TABLET | Freq: Once | ORAL | Status: AC
Start: 2024-02-10 — End: 2024-02-10
  Administered 2024-02-10: 10 mg via ORAL
  Filled 2024-02-10: qty 1

## 2024-02-10 NOTE — Progress Notes (Signed)
Patient has been examined by Dr. Ellin Saba. Vital signs (BP 181/85) and labs have been reviewed by MD - ANC, Creatinine, LFTs, hemoglobin, and platelets are within treatment parameters per M.D. - pt may proceed with treatment.  Primary RN and pharmacy notified.

## 2024-02-10 NOTE — Patient Instructions (Addendum)
[facility] at [facility] Discharge Instructions   You were seen and examined today by Dr. Ellin Saba.  He reviewed the results of your lab work which are normal/stable.   He reviewed the results of your PET scan which shows a lighting up in the left breast. We will arrange for you to have a mammogram to investigate this further.   Continue Revlimid as prescribed.   We will proceed with your treatment today.   Return as scheduled.    Thank you for choosing [facility] at [facility] to provide your oncology and hematology care.  To afford each patient quality time with our provider, please arrive at least 15 minutes before your scheduled appointment time.   If you have a lab appointment with the Cancer Center please come in thru the Main Entrance and check in at the main information desk.  You need to re-schedule your appointment should you arrive 10 or more minutes late.  We strive to give you quality time with our providers, and arriving late affects you and other patients whose appointments are after yours.  Also, if you no show three or more times for appointments you may be dismissed from the clinic at the providers discretion.     Again, thank you for choosing [facility].  Our hope is that these requests will decrease the amount of time that you wait before being seen by our physicians.       _____________________________________________________________  Should you have questions after your visit to [facility], please contact our office at [phone] and follow the prompts.  Our office hours are 8:00 a.m. and 4:30 p.m. Monday - Friday.  Please note that voicemails left after 4:00 p.m. may not be returned until the following business day.  We are closed weekends and major holidays.  You do have access to a nurse 24-7, just call the main number to the clinic [phone] and do not press any options, hold on the  line and a nurse will answer the phone.    For prescription refill requests, have your pharmacy contact our office and allow 72 hours.    Due to Covid, you will need to wear a mask upon entering the hospital. If you do not have a mask, a mask will be given to you at the Main Entrance upon arrival. For doctor visits, patients may have 1 support person age 59 or older with them. For treatment visits, patients can not have anyone with them due to social distancing guidelines and our immunocompromised population.

## 2024-02-10 NOTE — Progress Notes (Signed)
Brittany Archer [address] [place] [address]    Clinic Day:  02/10/2024  Referring physician: [name], MD  Patient Care Team: Brittany Filbert, MD as PCP - General (Internal Medicine) Brittany Massed, MD as Medical Oncologist (Hematology)   ASSESSMENT & PLAN:   Assessment: 1.  IgA kappa plasma cell myeloma, stage I: -RVD started on 01/09/2018, held since 02/14/2020 due to mandible abscess. -Myeloma labs on 05/14/2020 showed progression with M spike of 0.5 g. -RVD started back on 05/21/2020. -Myeloma labs on 07/10/2020 shows M spike improved to 0.3 g from 0.6 g previously.  Free light chain ratio is 1.74 with kappa light chains 28.4. -Myeloma panel from 08/14/2020 shows M spike 0.4 g.  Kappa light chains are 24.8 and ratio is 2.23. - Revlimid (20 mg 2 weeks on/1 week off) and Velcade (3 weeks on/1 week off) were held after 02/26/2022 due to weight loss. - Velcade maintenance every 2 weeks started on 04/13/2022.   2.  Osteomyelitis of the right mandible/dental abscess: -Finished IV ceftriaxone on 04/03/2020.  Finished oral antibiotics. -We will hold Xgeva indefinitely.    Plan: 1.  IgA kappa plasma cell myeloma, stage I: - PET scan (12/16/2023): No signs of bone lesions of multiple myeloma.  Tracer avid soft tissue lesion within the periareolar left breast 1.9 cm with SUV 6.1 concerning for malignancy.  Several morphological benign-appearing lymph nodes in the right axilla with mild increase in tracer uptake may be related to infiltration at the injection site.  Small focus of asymmetric uptake in the left temporal lobe gray-white matter junction. - She denies any headaches or vision changes. - She started on Revlimid 10 mg 3 weeks on/1 week off on 01/11/2024.  This is her off week.  She denies any diarrhea or other side effects. - Labs today: Creatinine 1.02 and stable.  LFTs are normal.  CBC grossly normal. - She will start back on Revlimid cycle 2 on  02/11/2024. - Continue Velcade every 2 weeks.  No neuropathy symptoms. - RTC 6 weeks for follow-up.  Will repeat multiple myeloma labs 2 weeks prior. - Will obtain left breast diagnostic mammogram and biopsy if positive.    2.  Severe hypokalemia: - Potassium is 3.3 today.  She will continue potassium 3 times daily.   3.  Osteomyelitis of the right mandible/dental abscess: - Bisphosphonates permanently on hold due to ONJ.   4.  Hypomagnesemia: - Continue magnesium 3 times daily.  Magnesium is 1.8.   5.  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  Hemoglobin stable at 10.5 today.    Orders Placed This Encounter  Procedures   MM 3D DIAGNOSTIC MAMMOGRAM BILATERAL BREAST    Standing Status:   Future    Expiration Date:   02/09/2025    Reason for Exam (SYMPTOM  OR DIAGNOSIS REQUIRED):   abnormal findings in left breast on PET    Preferred imaging location?:   [facility]   Magnesium    Standing Status:   Future    Expected Date:   02/10/2024    Expiration Date:   02/09/2025   CBC with Differential    Standing Status:   Future    Expected Date:   02/10/2024    Expiration Date:   02/10/2025   Comprehensive metabolic panel    Standing Status:   Future    Expected Date:   02/10/2024    Expiration Date:   02/10/2025   Magnesium  Standing Status:   Future    Expected Date:   02/24/2024    Expiration Date:   02/23/2025   CBC with Differential    Standing Status:   Future    Expected Date:   02/24/2024    Expiration Date:   02/24/2025   Comprehensive metabolic panel    Standing Status:   Future    Expected Date:   02/24/2024    Expiration Date:   02/24/2025   Magnesium    Standing Status:   Future    Expected Date:   03/09/2024    Expiration Date:   03/09/2025   CBC with Differential    Standing Status:   Future    Expected Date:   03/09/2024    Expiration Date:   03/10/2025   Comprehensive metabolic panel    Standing Status:   Future    Expected  Date:   03/09/2024    Expiration Date:   03/10/2025   Magnesium    Standing Status:   Future    Expected Date:   03/23/2024    Expiration Date:   03/23/2025   CBC with Differential    Standing Status:   Future    Expected Date:   03/23/2024    Expiration Date:   03/24/2025   Comprehensive metabolic panel    Standing Status:   Future    Expected Date:   03/23/2024    Expiration Date:   03/24/2025   Magnesium    Standing Status:   Future    Expected Date:   04/06/2024    Expiration Date:   04/06/2025   CBC with Differential    Standing Status:   Future    Expected Date:   04/06/2024    Expiration Date:   04/07/2025   Comprehensive metabolic panel    Standing Status:   Future    Expected Date:   04/06/2024    Expiration Date:   04/07/2025   Magnesium    Standing Status:   Future    Expected Date:   04/20/2024    Expiration Date:   04/20/2025   CBC with Differential    Standing Status:   Future    Expected Date:   04/20/2024    Expiration Date:   04/21/2025   Comprehensive metabolic panel    Standing Status:   Future    Expected Date:   04/20/2024    Expiration Date:   04/21/2025   Magnesium    Standing Status:   Future    Expected Date:   05/04/2024    Expiration Date:   05/04/2025   CBC with Differential    Standing Status:   Future    Expected Date:   05/04/2024    Expiration Date:   05/05/2025   Comprehensive metabolic panel    Standing Status:   Future    Expected Date:   05/04/2024    Expiration Date:   05/05/2025   Magnesium    Standing Status:   Future    Expected Date:   05/18/2024    Expiration Date:   05/18/2025   CBC with Differential    Standing Status:   Future    Expected Date:   05/18/2024    Expiration Date:   05/19/2025   Comprehensive metabolic panel    Standing Status:   Future    Expected Date:   05/18/2024    Expiration Date:   05/19/2025   Kappa/lambda light chains    Standing Status:  Future    Expected Date:   03/09/2024    Expiration Date:   02/09/2025   Protein  electrophoresis, serum    Standing Status:   Future    Expected Date:   03/09/2024    Expiration Date:   02/09/2025      Brittany Archer,acting as a scribe for Brittany Massed, MD.,have documented all relevant documentation on the behalf of Brittany Massed, MD,as directed by  Brittany Massed, MD while in the presence of Brittany Massed, MD.  I, Brittany Massed MD, have reviewed the above documentation for accuracy and completeness, and I agree with the above.      Brittany Massed, MD   2/13/20252:27 PM  CHIEF COMPLAINT:   Diagnosis: multiple myeloma    Cancer Staging  No matching staging information was found for the patient.    Prior Therapy: RVD 01/09/18 through 02/14/20, 05/21/20 through 02/26/22   Current Therapy:  Maintenance Velcade every other week.    HISTORY OF PRESENT ILLNESS:   Oncology History  Multiple myeloma not having achieved remission (HCC)  01/20/2018 Initial Diagnosis   Multiple myeloma not having achieved remission (HCC)   01/26/2018 - 09/01/2022 Chemotherapy   Patient is on Treatment Plan : MYELOMA  RVD SQ (Bortezomib d 1,8,15 ) q28d x 4 cycles     04/13/2022 -  Chemotherapy   Patient is on Treatment Plan : MYELOMA MAINTENANCE Bortezomib SQ q14d        INTERVAL HISTORY:   Brittany Archer is a 83 y.o. female presenting to clinic today for follow up of multiple myeloma. She was last seen by me on 12/30/23.  Today, she states that she is doing well overall. Her appetite level is at 100%. Her energy level is at 75%. She is accompanied by her daughter. Aalia has been living with her sister, and had another sister pass last night. She started Revlimid on 01/11/24 and is on the last day of her off week. She denies any side effects, including headaches on the left side, vision changes in the left eye, peripheral neuropathy, or balance issues.  PAST MEDICAL HISTORY:   Past Medical History: Past Medical History:  Diagnosis Date   Breast  cancer (HCC)    left breast/ 2008/ surg/ rad tx   Coronary artery disease    Diabetes mellitus     Surgical History: Past Surgical History:  Procedure Laterality Date   ABDOMINAL HYSTERECTOMY     BREAST SURGERY     DEBRIDEMENT MANDIBLE N/A 02/22/2020   Procedure: INCISION AND DRAINAGE WITH DEBRIDEMENT MANDIBLE;  Surgeon: [name], DMD;  Location: [facility];  Service: Oral Surgery;  Laterality: N/A;   DEBRIDEMENT MANDIBLE Right 03/19/2021   Procedure: DEBRIDEMENT OF BONE RIGHT INTERIOR  MANDIBLE;  Surgeon: [name], DMD;  Location: [facility];  Service: Oral Surgery;  Laterality: Right;   EYE SURGERY  2021   cataract removals    TOOTH EXTRACTION N/A 02/22/2020   Procedure: DENTAL RESTORATION/EXTRACTIONS;  Surgeon: [name], DMD;  Location: [facility];  Service: Oral Surgery;  Laterality: N/A;  DENTAL KIT REQUESTED    Social History: Social History   Socioeconomic History   Marital status: Divorced    Spouse name: Not on file   Number of children: Not on file   Years of education: Not on file   Highest education level: Not on file  Occupational History   Not on file  Tobacco Use   Smoking status: Never   Smokeless tobacco: Never  Vaping Use  Vaping status: Never Used  Substance and Sexual Activity   Alcohol use: No   Drug use: No   Sexual activity: Yes    Birth control/protection: Surgical  Other Topics Concern   Not on file  Social History Narrative   Not on file   Social Drivers of Health   Financial Resource Strain: Low Risk  (12/09/2020)   Overall Financial Resource Strain (CARDIA)    Difficulty of Paying Living Expenses: Not hard at all  Food Insecurity: No Food Insecurity (12/09/2020)   Hunger Vital Sign    Worried About Running Out of Food in the Last Year: Never true    Ran Out of Food in the Last Year: Never true  Transportation Needs: No Transportation Needs (12/09/2020)   PRAPARE - Administrator, Civil Service (Medical): No    Lack of  Transportation (Non-Medical): No  Physical Activity: Inactive (12/09/2020)   Exercise Vital Sign    Days of Exercise per Week: 0 days    Minutes of Exercise per Session: 0 min  Stress: No Stress Concern Present (12/09/2020)   [employer] of Occupational Health - Occupational Stress Questionnaire    Feeling of Stress : Not at all  Social Connections: Moderately Isolated (12/09/2020)   Social Connection and Isolation Panel [NHANES]    Frequency of Communication with Friends and Family: More than three times a week    Frequency of Social Gatherings with Friends and Family: More than three times a week    Attends Religious Services: More than 4 times per year    Active Member of [employer] or Organizations: No    Attends Banker Meetings: Never    Marital Status: Divorced  Catering manager Violence: Not At Risk (12/09/2020)   Humiliation, Afraid, Rape, and Kick questionnaire    Fear of Current or Ex-Partner: No    Emotionally Abused: No    Physically Abused: No    Sexually Abused: No    Family History: Family History  Problem Relation Age of Onset   Obesity Sister     Current Medications:  Current Outpatient Medications:    acyclovir (ZOVIRAX) 400 MG tablet, Take 400 mg by mouth 2 (two) times daily., Disp: , Rfl:    aspirin 81 MG tablet, Take 1 tablet (81 mg total) by mouth daily with breakfast., Disp: 30 tablet, Rfl: 5   atorvastatin (LIPITOR) 10 MG tablet, Take 10 mg by mouth daily., Disp: , Rfl:    bortezomib IV (VELCADE) 3.5 MG injection, 3.5 mg once a week. weekly, Disp: , Rfl:    chlorhexidine (PERIDEX) 0.12 % solution, Use as directed 15 mLs in the mouth or throat 3 (three) times daily., Disp: , Rfl:    cholecalciferol (VITAMIN D) 25 MCG (1000 UNIT) tablet, 1 capsule, Disp: , Rfl:    glucose blood (ACCU-CHEK AVIVA PLUS) test strip, CHECK BLOOD SUGAR ONCE DAILY, Disp: , Rfl:    GNP ASPIRIN LOW DOSE 81 MG tablet, Take 81 mg by mouth daily., Disp: , Rfl:     lenalidomide (REVLIMID) 10 MG capsule, Take 1 capsule (10 mg total) by mouth daily. 21 days on, 7 days off, Disp: 21 capsule, Rfl: 0   magnesium oxide (MAG-OX) 400 (240 Mg) MG tablet, TAKE ONE TABLET BY MOUTH THREE TIMES A DAY, Disp: 90 tablet, Rfl: 3   mirtazapine (REMERON) 15 MG tablet, Take 0.5 tablets (7.5 mg total) by mouth at bedtime. For appetite stimulation, Disp: 30 tablet, Rfl: 2   ondansetron (ZOFRAN)  8 MG tablet, , Disp: , Rfl:    Potassium Acetate POWD, Take 20 mEq by mouth daily., Disp: 12000 g, Rfl: 5   potassium chloride (KLOR-CON) 20 MEQ packet, MIX AND TAKE ONE PACKET DAILY., Disp: 30 packet, Rfl: 3 No current facility-administered medications for this visit.  Facility-Administered Medications Ordered in Other Visits:    bortezomib SQ (VELCADE) chemo injection (2.5mg /mL concentration) 2.25 mg, 1.3 mg/m2 (Treatment Plan Recorded), Subcutaneous, Once, Brittany Massed, MD   Allergies: Allergies  Allergen Reactions   Other Other (See Comments)   Seasonal Ic [Cholestatin] Other (See Comments)    Sneezing, watery eyes   Motrin [Ibuprofen] Rash    REVIEW OF SYSTEMS:   Review of Systems  Constitutional:  Negative for chills, fatigue and fever.  HENT:   Negative for lump/mass, mouth sores, nosebleeds, sore throat and trouble swallowing.   Eyes:  Negative for eye problems.  Respiratory:  Negative for cough and shortness of breath.   Cardiovascular:  Negative for chest pain, leg swelling and palpitations.  Gastrointestinal:  Negative for abdominal pain, constipation, diarrhea, nausea and vomiting.  Genitourinary:  Negative for bladder incontinence, difficulty urinating, dysuria, frequency, hematuria and nocturia.   Musculoskeletal:  Negative for arthralgias, back pain, flank pain, myalgias and neck pain.  Skin:  Negative for itching and rash.  Neurological:  Negative for dizziness, headaches and numbness.  Hematological:  Does not bruise/bleed easily.   Psychiatric/Behavioral:  Negative for depression, sleep disturbance and suicidal ideas. The patient is not nervous/anxious.   All other systems reviewed and are negative.    VITALS:   Blood pressure (!) 181/85, pulse 71, temperature 99.2 F (37.3 C), temperature source Oral, resp. rate 16, weight 164 lb 7.4 oz (74.6 kg), SpO2 100%.  Wt Readings from Last 3 Encounters:  02/10/24 164 lb 7.4 oz (74.6 kg)  01/27/24 165 lb (74.8 kg)  01/13/24 164 lb 3.2 oz (74.5 kg)    Body mass index is 30.08 kg/m.  Performance status (ECOG): 1 - Symptomatic but completely ambulatory  PHYSICAL EXAM:   Physical Exam Vitals and nursing note reviewed. Exam conducted with a chaperone present.  Constitutional:      Appearance: Normal appearance.  Cardiovascular:     Rate and Rhythm: Normal rate and regular rhythm.     Pulses: Normal pulses.     Heart sounds: Normal heart sounds.  Pulmonary:     Effort: Pulmonary effort is normal.     Breath sounds: Normal breath sounds.  Abdominal:     Palpations: Abdomen is soft. There is no hepatomegaly, splenomegaly or mass.     Tenderness: There is no abdominal tenderness.  Musculoskeletal:     Right lower leg: No edema.     Left lower leg: No edema.  Lymphadenopathy:     Cervical: No cervical adenopathy.     Right cervical: No superficial, deep or posterior cervical adenopathy.    Left cervical: No superficial, deep or posterior cervical adenopathy.     Upper Body:     Right upper body: No supraclavicular or axillary adenopathy.     Left upper body: No supraclavicular or axillary adenopathy.  Neurological:     General: No focal deficit present.     Mental Status: She is alert and oriented to person, place, and time.  Psychiatric:        Mood and Affect: Mood normal.        Behavior: Behavior normal.     LABS:      Latest Ref  Rng & Units 02/10/2024   11:33 AM 01/27/2024    1:08 PM 01/13/2024    9:02 AM  CBC  WBC 4.0 - 10.5 K/uL 4.7  6.0  6.3    Hemoglobin 12.0 - 15.0 g/dL 09.8  11.9  14.7   Hematocrit 36.0 - 46.0 % 33.2  33.1  35.3   Platelets 150 - 400 K/uL 213  206  206       Latest Ref Rng & Units 02/10/2024   11:33 AM 01/27/2024    1:08 PM 01/13/2024    9:02 AM  CMP  Glucose 70 - 99 mg/dL 829  562  130   BUN 8 - 23 mg/dL 13  15  10    Creatinine 0.44 - 1.00 mg/dL 8.65  7.84  6.96   Sodium 135 - 145 mmol/L 137  139  137   Potassium 3.5 - 5.1 mmol/L 3.3  4.0  3.9   Chloride 98 - 111 mmol/L 104  104  99   CO2 22 - 32 mmol/L 24  27  28    Calcium 8.9 - 10.3 mg/dL 9.2  9.5  9.5   Total Protein 6.5 - 8.1 g/dL 7.0  7.2  7.3   Total Bilirubin 0.0 - 1.2 mg/dL 0.6  0.7  0.8   Alkaline Phos 38 - 126 U/L 118  102  117   AST 15 - 41 U/L 17  15  18    ALT 0 - 44 U/L 14  14  18       No results found for: "CEA1", "CEA" / No results found for: "CEA1", "CEA" No results found for: "PSA1" No results found for: "CAN199" No results found for: "CAN125"  Lab Results  Component Value Date   TOTALPROTELP 6.8 12/02/2023   ALBUMINELP 3.5 12/02/2023   A1GS 0.2 12/02/2023   A2GS 0.8 12/02/2023   BETS 1.5 (H) 12/02/2023   GAMS 0.8 12/02/2023   MSPIKE 0.5 (H) 12/02/2023   SPEI Comment 12/02/2023   Lab Results  Component Value Date   TIBC 137 (L) 05/20/2023   TIBC 117 (L) 03/24/2023   TIBC 107 (L) 01/26/2023   FERRITIN 267 05/20/2023   FERRITIN 257 03/24/2023   FERRITIN 339 (H) 01/26/2023   IRONPCTSAT 33 (H) 05/20/2023   IRONPCTSAT 44 (H) 03/24/2023   IRONPCTSAT 50 (H) 01/26/2023   Lab Results  Component Value Date   LDH 137 05/20/2023   LDH 134 03/24/2023   LDH 125 01/26/2023     STUDIES:   No results found.

## 2024-02-10 NOTE — Patient Instructions (Signed)
[facility] [place] - A DEPT OF [facility]  Discharge Instructions: Thank you for choosing [facility] to provide your oncology and hematology care.  If you have a lab appointment with the Cancer Center - please note that after April 8th, 2024, all labs will be drawn in the cancer center.  You do not have to check in or register with the main entrance as you have in the past but will complete your check-in in the cancer center.  Wear comfortable clothing and clothing appropriate for easy access to any Portacath or PICC line.   We strive to give you quality time with your provider. You may need to reschedule your appointment if you arrive late (15 or more minutes).  Arriving late affects you and other patients whose appointments are after yours.  Also, if you miss three or more appointments without notifying the office, you may be dismissed from the clinic at the provider's discretion.      For prescription refill requests, have your pharmacy contact our office and allow 72 hours for refills to be completed.    Today you received the following chemotherapy and/or immunotherapy agents Velcade      To help prevent nausea and vomiting after your treatment, we encourage you to take your nausea medication as directed.  BELOW ARE SYMPTOMS THAT SHOULD BE REPORTED IMMEDIATELY: *FEVER GREATER THAN 100.4 F (38 C) OR HIGHER *CHILLS OR SWEATING *NAUSEA AND VOMITING THAT IS NOT CONTROLLED WITH YOUR NAUSEA MEDICATION *UNUSUAL SHORTNESS OF BREATH *UNUSUAL BRUISING OR BLEEDING *URINARY PROBLEMS (pain or burning when urinating, or frequent urination) *BOWEL PROBLEMS (unusual diarrhea, constipation, pain near the anus) TENDERNESS IN MOUTH AND THROAT WITH OR WITHOUT PRESENCE OF ULCERS (sore throat, sores in mouth, or a toothache) UNUSUAL RASH, SWELLING OR PAIN  UNUSUAL VAGINAL DISCHARGE OR ITCHING   Items with * indicate a potential emergency and should be followed up  as soon as possible or go to the Emergency Department if any problems should occur.  Please show the CHEMOTHERAPY ALERT CARD or IMMUNOTHERAPY ALERT CARD at check-in to the Emergency Department and triage nurse.  Should you have questions after your visit or need to cancel or reschedule your appointment, please contact [facility] [place] - A DEPT OF [facility] [phone]  and follow the prompts.  Office hours are 8:00 a.m. to 4:30 p.m. Monday - Friday. Please note that voicemails left after 4:00 p.m. may not be returned until the following business day.  We are closed weekends and major holidays. You have access to a nurse at all times for urgent questions. Please call the main number to the clinic [phone] and follow the prompts.  For any non-urgent questions, you may also contact your provider using MyChart. We now offer e-Visits for anyone 44 and older to request care online for non-urgent symptoms. For details visit [email or web address].   Also download the MyChart app! Go to the app store, search "MyChart", open the app, select [facility], and log in with your MyChart username and password.

## 2024-02-10 NOTE — Progress Notes (Signed)
Patient presents today for Velcade injection per provides order.  Vital signs and labs reviewed by MD.  Message received from Chapman Moss RN/Dr. Ellin Saba, patient okay for treatment.  Stable during administration without incident; injection site WNL; see MAR for injection details.  Patient tolerated procedure well and without incident.  No questions or complaints noted at this time.

## 2024-02-12 ENCOUNTER — Other Ambulatory Visit: Payer: Self-pay

## 2024-02-17 ENCOUNTER — Ambulatory Visit (HOSPITAL_COMMUNITY): Payer: Medicare HMO

## 2024-02-17 ENCOUNTER — Encounter (HOSPITAL_COMMUNITY): Payer: Medicare HMO

## 2024-02-22 ENCOUNTER — Other Ambulatory Visit: Payer: Self-pay | Admitting: Hematology

## 2024-02-22 ENCOUNTER — Other Ambulatory Visit: Payer: Self-pay

## 2024-02-22 DIAGNOSIS — C9000 Multiple myeloma not having achieved remission: Secondary | ICD-10-CM

## 2024-02-23 ENCOUNTER — Other Ambulatory Visit: Payer: Self-pay

## 2024-02-23 DIAGNOSIS — C9000 Multiple myeloma not having achieved remission: Secondary | ICD-10-CM

## 2024-02-23 MED ORDER — LENALIDOMIDE 10 MG PO CAPS: 10.0000 mg | ORAL_CAPSULE | Freq: Every day | ORAL | 0 refills | Status: DC

## 2024-02-23 NOTE — Telephone Encounter (Signed)
 Chart reviewed. Revlimid refilled per last office note with Dr. Ellin Saba.

## 2024-02-24 ENCOUNTER — Inpatient Hospital Stay: Payer: Medicare HMO | Admitting: Hematology

## 2024-02-24 ENCOUNTER — Telehealth: Payer: Self-pay | Admitting: Emergency Medicine

## 2024-02-24 ENCOUNTER — Inpatient Hospital Stay: Payer: Medicare HMO

## 2024-02-24 VITALS — BP 151/62 | HR 81 | Temp 97.3°F | Resp 18 | Wt 162.0 lb

## 2024-02-24 DIAGNOSIS — Z5112 Encounter for antineoplastic immunotherapy: Secondary | ICD-10-CM | POA: Diagnosis not present

## 2024-02-24 DIAGNOSIS — C9000 Multiple myeloma not having achieved remission: Secondary | ICD-10-CM

## 2024-02-24 LAB — CBC WITH DIFFERENTIAL/PLATELET
Abs Immature Granulocytes: 0.04 10*3/uL (ref 0.00–0.07)
Basophils Absolute: 0 10*3/uL (ref 0.0–0.1)
Basophils Relative: 0 %
Eosinophils Absolute: 0.1 10*3/uL (ref 0.0–0.5)
Eosinophils Relative: 2 %
HCT: 33.1 % — ABNORMAL LOW (ref 36.0–46.0)
Hemoglobin: 10.6 g/dL — ABNORMAL LOW (ref 12.0–15.0)
Immature Granulocytes: 1 %
Lymphocytes Relative: 25 %
Lymphs Abs: 1.7 10*3/uL (ref 0.7–4.0)
MCH: 32.2 pg (ref 26.0–34.0)
MCHC: 32 g/dL (ref 30.0–36.0)
MCV: 100.6 fL — ABNORMAL HIGH (ref 80.0–100.0)
Monocytes Absolute: 0.5 10*3/uL (ref 0.1–1.0)
Monocytes Relative: 8 %
Neutro Abs: 4.5 10*3/uL (ref 1.7–7.7)
Neutrophils Relative %: 64 %
Platelets: 294 10*3/uL (ref 150–400)
RBC: 3.29 MIL/uL — ABNORMAL LOW (ref 3.87–5.11)
RDW: 13.6 % (ref 11.5–15.5)
WBC: 6.9 10*3/uL (ref 4.0–10.5)
nRBC: 0 % (ref 0.0–0.2)

## 2024-02-24 LAB — COMPREHENSIVE METABOLIC PANEL
ALT: 8 U/L (ref 0–44)
AST: 15 U/L (ref 15–41)
Albumin: 3 g/dL — ABNORMAL LOW (ref 3.5–5.0)
Alkaline Phosphatase: 90 U/L (ref 38–126)
Anion gap: 13 (ref 5–15)
BUN: 13 mg/dL (ref 8–23)
CO2: 24 mmol/L (ref 22–32)
Calcium: 9.2 mg/dL (ref 8.9–10.3)
Chloride: 99 mmol/L (ref 98–111)
Creatinine, Ser: 1.07 mg/dL — ABNORMAL HIGH (ref 0.44–1.00)
GFR, Estimated: 52 mL/min — ABNORMAL LOW (ref >60)
Glucose, Bld: 205 mg/dL — ABNORMAL HIGH (ref 70–99)
Potassium: 2.9 mmol/L — ABNORMAL LOW (ref 3.5–5.1)
Sodium: 136 mmol/L (ref 135–145)
Total Bilirubin: 1.2 mg/dL (ref 0.0–1.2)
Total Protein: 7.2 g/dL (ref 6.5–8.1)

## 2024-02-24 LAB — MAGNESIUM: Magnesium: 1.7 mg/dL (ref 1.7–2.4)

## 2024-02-24 MED ORDER — DEXAMETHASONE 4 MG PO TABS
20.0000 mg | ORAL_TABLET | Freq: Once | ORAL | Status: AC
  Administered 2024-02-24: 20 mg via ORAL
  Filled 2024-02-24: qty 5

## 2024-02-24 MED ORDER — PROCHLORPERAZINE MALEATE 10 MG PO TABS
10.0000 mg | ORAL_TABLET | Freq: Once | ORAL | Status: AC
Start: 2024-02-24 — End: 2024-02-24
  Administered 2024-02-24: 10 mg via ORAL
  Filled 2024-02-24: qty 1

## 2024-02-24 MED ORDER — BORTEZOMIB CHEMO SQ INJECTION 3.5 MG (2.5MG/ML)
1.3000 mg/m2 | Freq: Once | INTRAMUSCULAR | Status: AC
  Administered 2024-02-24: 2.25 mg via SUBCUTANEOUS
  Filled 2024-02-24: qty 0.9

## 2024-02-24 MED ORDER — POTASSIUM CHLORIDE CRYS ER 20 MEQ PO TBCR
40.0000 meq | EXTENDED_RELEASE_TABLET | Freq: Once | ORAL | Status: AC
  Administered 2024-02-24: 40 meq via ORAL
  Filled 2024-02-24: qty 2

## 2024-02-24 NOTE — Progress Notes (Signed)
 Patient tolerated Velcade injection with no complaints voiced.  Lab work reviewed.  See MAR for details.  Injection site clean and dry with no bruising or swelling noted.  Patient stable during and after injection.  Band aid applied.  VSS.  Patient left in satisfactory condition with no s/s of distress noted.

## 2024-02-24 NOTE — Patient Instructions (Signed)
 [facility] [place] - [facility] OF [facility]  Discharge Instructions: Thank you for choosing [facility] to provide your oncology and hematology care.  If you have a lab appointment with the Cancer Center - please note that after April 8th, 2024, all labs will be drawn in the cancer center.  You do not have to check in or register with the main entrance as you have in the past but will complete your check-in in the cancer center.  Wear comfortable clothing and clothing appropriate for easy access to any Portacath or PICC line.   We strive to give you quality time with your provider. You may need to reschedule your appointment if you arrive late (15 or more minutes).  Arriving late affects you and other patients whose appointments are after yours.  Also, if you miss three or more appointments without notifying the office, you may be dismissed from the clinic at the provider's discretion.      For prescription refill requests, have your pharmacy contact our office and allow 72 hours for refills to be completed.    Today you received the following chemotherapy and/or immunotherapy agents Velcade      To help prevent nausea and vomiting after your treatment, we encourage you to take your nausea medication as directed.  BELOW ARE SYMPTOMS THAT SHOULD BE REPORTED IMMEDIATELY: *FEVER GREATER THAN 100.4 F (38 C) OR HIGHER *CHILLS OR SWEATING *NAUSEA AND VOMITING THAT IS NOT CONTROLLED WITH YOUR NAUSEA MEDICATION *UNUSUAL SHORTNESS OF BREATH *UNUSUAL BRUISING OR BLEEDING *URINARY PROBLEMS (pain or burning when urinating, or frequent urination) *BOWEL PROBLEMS (unusual diarrhea, constipation, pain near the anus) TENDERNESS IN MOUTH AND THROAT WITH OR WITHOUT PRESENCE OF ULCERS (sore throat, sores in mouth, or a toothache) UNUSUAL RASH, SWELLING OR PAIN  UNUSUAL VAGINAL DISCHARGE OR ITCHING   Items with * indicate a potential emergency and should be followed up  as soon as possible or go to the Emergency Department if any problems should occur.  Please show the CHEMOTHERAPY ALERT CARD or IMMUNOTHERAPY ALERT CARD at check-in to the Emergency Department and triage nurse.  Should you have questions after your visit or need to cancel or reschedule your appointment, please contact [facility] [place] - A DEPT OF Eligha Bridegroom [facility] [phone]  and follow the prompts.  Office hours are 8:00 a.m. to 4:30 p.m. Monday - Friday. Please note that voicemails left after 4:00 p.m. may not be returned until the following business day.  We are closed weekends and major holidays. You have access to a nurse at all times for urgent questions. Please call the main number to the clinic [phone] and follow the prompts.  For any non-urgent questions, you may also contact your provider using MyChart. We now offer e-Visits for anyone 44 and older to request care online for non-urgent symptoms. For details visit [email or web address].   Also download the MyChart app! Go to the app store, search "MyChart", open the app, select [place], and log in with your MyChart username and password.

## 2024-02-29 NOTE — Progress Notes (Signed)
 Dr. Ellin Saba made aware of K+.  No new orders received.

## 2024-03-01 ENCOUNTER — Encounter: Payer: Self-pay | Admitting: Hematology

## 2024-03-01 ENCOUNTER — Encounter (HOSPITAL_COMMUNITY): Payer: Self-pay | Admitting: Hematology

## 2024-03-07 ENCOUNTER — Other Ambulatory Visit: Payer: Self-pay

## 2024-03-09 ENCOUNTER — Inpatient Hospital Stay: Payer: Medicare HMO | Attending: Hematology

## 2024-03-09 ENCOUNTER — Inpatient Hospital Stay: Payer: Medicare HMO

## 2024-03-09 ENCOUNTER — Ambulatory Visit (HOSPITAL_COMMUNITY)
Admission: RE | Admit: 2024-03-09 | Discharge: 2024-03-09 | Disposition: A | Discharge status: Home / Self Care | Payer: Medicare HMO | Source: Ambulatory Visit | Attending: Hematology | Admitting: Hematology

## 2024-03-09 ENCOUNTER — Other Ambulatory Visit: Payer: Self-pay | Admitting: Hematology

## 2024-03-09 VITALS — BP 141/53 | HR 60 | Temp 97.8°F | Resp 18 | Wt 157.8 lb

## 2024-03-09 DIAGNOSIS — R9389 Abnormal findings on diagnostic imaging of other specified body structures: Secondary | ICD-10-CM | POA: Insufficient documentation

## 2024-03-09 DIAGNOSIS — C9000 Multiple myeloma not having achieved remission: Secondary | ICD-10-CM

## 2024-03-09 DIAGNOSIS — Z79899 Other long term (current) drug therapy: Secondary | ICD-10-CM | POA: Insufficient documentation

## 2024-03-09 DIAGNOSIS — D631 Anemia in chronic kidney disease: Secondary | ICD-10-CM | POA: Insufficient documentation

## 2024-03-09 DIAGNOSIS — N649 Disorder of breast, unspecified: Secondary | ICD-10-CM | POA: Insufficient documentation

## 2024-03-09 DIAGNOSIS — N1832 Chronic kidney disease, stage 3b: Secondary | ICD-10-CM | POA: Diagnosis not present

## 2024-03-09 DIAGNOSIS — Z5112 Encounter for antineoplastic immunotherapy: Secondary | ICD-10-CM | POA: Diagnosis present

## 2024-03-09 DIAGNOSIS — R921 Mammographic calcification found on diagnostic imaging of breast: Secondary | ICD-10-CM

## 2024-03-09 LAB — CBC WITH DIFFERENTIAL/PLATELET
Abs Immature Granulocytes: 0.03 10*3/uL (ref 0.00–0.07)
Basophils Absolute: 0.1 10*3/uL (ref 0.0–0.1)
Basophils Relative: 1 %
Eosinophils Absolute: 0.1 10*3/uL (ref 0.0–0.5)
Eosinophils Relative: 1 %
HCT: 31.3 % — ABNORMAL LOW (ref 36.0–46.0)
Hemoglobin: 10.1 g/dL — ABNORMAL LOW (ref 12.0–15.0)
Immature Granulocytes: 0 %
Lymphocytes Relative: 22 %
Lymphs Abs: 1.5 10*3/uL (ref 0.7–4.0)
MCH: 32.5 pg (ref 26.0–34.0)
MCHC: 32.3 g/dL (ref 30.0–36.0)
MCV: 100.6 fL — ABNORMAL HIGH (ref 80.0–100.0)
Monocytes Absolute: 0.5 10*3/uL (ref 0.1–1.0)
Monocytes Relative: 8 %
Neutro Abs: 4.5 10*3/uL (ref 1.7–7.7)
Neutrophils Relative %: 68 %
Platelets: 274 10*3/uL (ref 150–400)
RBC: 3.11 MIL/uL — ABNORMAL LOW (ref 3.87–5.11)
RDW: 13.9 % (ref 11.5–15.5)
WBC: 6.7 10*3/uL (ref 4.0–10.5)
nRBC: 0 % (ref 0.0–0.2)

## 2024-03-09 LAB — COMPREHENSIVE METABOLIC PANEL WITH GFR
ALT: 10 U/L (ref 0–44)
AST: 16 U/L (ref 15–41)
Albumin: 2.8 g/dL — ABNORMAL LOW (ref 3.5–5.0)
Alkaline Phosphatase: 93 U/L (ref 38–126)
Anion gap: 10 (ref 5–15)
BUN: 12 mg/dL (ref 8–23)
CO2: 26 mmol/L (ref 22–32)
Calcium: 9.3 mg/dL (ref 8.9–10.3)
Chloride: 105 mmol/L (ref 98–111)
Creatinine, Ser: 1.07 mg/dL — ABNORMAL HIGH (ref 0.44–1.00)
GFR, Estimated: 52 mL/min — ABNORMAL LOW
Glucose, Bld: 163 mg/dL — ABNORMAL HIGH (ref 70–99)
Potassium: 3.7 mmol/L (ref 3.5–5.1)
Sodium: 141 mmol/L (ref 135–145)
Total Bilirubin: 0.5 mg/dL (ref 0.0–1.2)
Total Protein: 7 g/dL (ref 6.5–8.1)

## 2024-03-09 LAB — MAGNESIUM: Magnesium: 1.7 mg/dL (ref 1.7–2.4)

## 2024-03-09 MED ORDER — DEXAMETHASONE 4 MG PO TABS
20.0000 mg | ORAL_TABLET | Freq: Once | ORAL | Status: AC
  Administered 2024-03-09: 20 mg via ORAL
  Filled 2024-03-09: qty 5

## 2024-03-09 MED ORDER — PROCHLORPERAZINE MALEATE 10 MG PO TABS
10.0000 mg | ORAL_TABLET | Freq: Once | ORAL | Status: AC
  Administered 2024-03-09: 10 mg via ORAL
  Filled 2024-03-09: qty 1

## 2024-03-09 MED ORDER — BORTEZOMIB CHEMO SQ INJECTION 3.5 MG (2.5MG/ML)
1.3000 mg/m2 | Freq: Once | INTRAMUSCULAR | Status: AC
  Administered 2024-03-09: 2.25 mg via SUBCUTANEOUS
  Filled 2024-03-09: qty 0.9

## 2024-03-09 NOTE — Patient Instructions (Signed)
 [facility] [place] [facility]  Discharge Instructions: Thank you for choosing [facility] to provide your oncology and hematology care.  If you have a lab appointment with the Cancer Center - please note that after April 8th, 2024, all labs will be drawn in the cancer center.  You do not have to check in or register with the main entrance as you have in the past but will complete your check-in in the cancer center.  Wear comfortable clothing and clothing appropriate for easy access to any Portacath or PICC line.   We strive to give you quality time with your provider. You may need to reschedule your appointment if you arrive late (15 or more minutes).  Arriving late affects you and other patients whose appointments are after yours.  Also, if you miss three or more appointments without notifying the office, you may be dismissed from the clinic at the provider's discretion.      For prescription refill requests, have your pharmacy contact our office and allow 72 hours for refills to be completed.    Today you received the following chemotherapy and/or immunotherapy agents velcade     To help prevent nausea and vomiting after your treatment, we encourage you to take your nausea medication as directed.  BELOW ARE SYMPTOMS THAT SHOULD BE REPORTED IMMEDIATELY: *FEVER GREATER THAN 100.4 F (38 C) OR HIGHER *CHILLS OR SWEATING *NAUSEA AND VOMITING THAT IS NOT CONTROLLED WITH YOUR NAUSEA MEDICATION *UNUSUAL SHORTNESS OF BREATH *UNUSUAL BRUISING OR BLEEDING *URINARY PROBLEMS (pain or burning when urinating, or frequent urination) *BOWEL PROBLEMS (unusual diarrhea, constipation, pain near the anus) TENDERNESS IN MOUTH AND THROAT WITH OR WITHOUT PRESENCE OF ULCERS (sore throat, sores in mouth, or a toothache) UNUSUAL RASH, SWELLING OR PAIN  UNUSUAL VAGINAL DISCHARGE OR ITCHING   Items with * indicate a potential emergency and should be followed up as  soon as possible or go to the Emergency Department if any problems should occur.  Please show the CHEMOTHERAPY ALERT CARD or IMMUNOTHERAPY ALERT CARD at check-in to the Emergency Department and triage nurse.  Should you have questions after your visit or need to cancel or reschedule your appointment, please contact [facility] [place] - A DEPT OF [facility] [phone]  and follow the prompts.  Office hours are 8:00 a.m. to 4:30 p.m. Monday - Friday. Please note that voicemails left after 4:00 p.m. may not be returned until the following business day.  We are closed weekends and major holidays. You have access to a nurse at all times for urgent questions. Please call the main number to the clinic [phone] and follow the prompts.  For any non-urgent questions, you may also contact your provider using MyChart. We now offer e-Visits for anyone 62 and older to request care online for non-urgent symptoms. For details visit [email or web address].   Also download the MyChart app! Go to the app store, search "MyChart", open the app, select [place], and log in with your MyChart username and password.

## 2024-03-09 NOTE — Progress Notes (Signed)
 Patient tolerated Velcade njection with no complaints voiced.  Site clean and dry with no bruising or swelling noted at site.  See MAR for details.  Band aid applied.  Patient stable during and after injection.  Vss with discharge and left in satisfactory condition with no s/s of distress noted. All follow ups as scheduled.   Brittany Archer [employer]

## 2024-03-10 LAB — KAPPA/LAMBDA LIGHT CHAINS
Kappa free light chain: 113.8 mg/L — ABNORMAL HIGH (ref 3.3–19.4)
Kappa, lambda light chain ratio: 2.89 — ABNORMAL HIGH (ref 0.26–1.65)
Lambda free light chains: 39.4 mg/L — ABNORMAL HIGH (ref 5.7–26.3)

## 2024-03-13 LAB — PROTEIN ELECTROPHORESIS, SERUM
A/G Ratio: 0.8 (ref 0.7–1.7)
Albumin ELP: 2.9 g/dL (ref 2.9–4.4)
Alpha-1-Globulin: 0.2 g/dL (ref 0.0–0.4)
Alpha-2-Globulin: 1 g/dL (ref 0.4–1.0)
Beta Globulin: 1.4 g/dL — ABNORMAL HIGH (ref 0.7–1.3)
Gamma Globulin: 1.2 g/dL (ref 0.4–1.8)
Globulin, Total: 3.7 g/dL (ref 2.2–3.9)
M-Spike, %: 0.5 g/dL — ABNORMAL HIGH
Total Protein ELP: 6.6 g/dL (ref 6.0–8.5)

## 2024-03-21 ENCOUNTER — Other Ambulatory Visit: Payer: Self-pay

## 2024-03-22 ENCOUNTER — Other Ambulatory Visit: Payer: Self-pay | Admitting: Hematology

## 2024-03-22 DIAGNOSIS — C9000 Multiple myeloma not having achieved remission: Secondary | ICD-10-CM

## 2024-03-23 ENCOUNTER — Inpatient Hospital Stay (HOSPITAL_BASED_OUTPATIENT_CLINIC_OR_DEPARTMENT_OTHER): Payer: Medicare HMO | Admitting: Hematology

## 2024-03-23 ENCOUNTER — Inpatient Hospital Stay: Payer: Medicare HMO

## 2024-03-23 VITALS — BP 165/65 | HR 59 | Temp 97.1°F | Resp 17 | Ht 64.0 in | Wt 157.0 lb

## 2024-03-23 DIAGNOSIS — C9000 Multiple myeloma not having achieved remission: Secondary | ICD-10-CM

## 2024-03-23 DIAGNOSIS — Z5112 Encounter for antineoplastic immunotherapy: Secondary | ICD-10-CM | POA: Diagnosis not present

## 2024-03-23 DIAGNOSIS — E538 Deficiency of other specified B group vitamins: Secondary | ICD-10-CM

## 2024-03-23 LAB — CBC WITH DIFFERENTIAL/PLATELET
Abs Immature Granulocytes: 0.02 10*3/uL (ref 0.00–0.07)
Basophils Absolute: 0.1 10*3/uL (ref 0.0–0.1)
Basophils Relative: 2 %
Eosinophils Absolute: 0.1 10*3/uL (ref 0.0–0.5)
Eosinophils Relative: 3 %
HCT: 30 % — ABNORMAL LOW (ref 36.0–46.0)
Hemoglobin: 9.4 g/dL — ABNORMAL LOW (ref 12.0–15.0)
Immature Granulocytes: 1 %
Lymphocytes Relative: 32 %
Lymphs Abs: 1.3 10*3/uL (ref 0.7–4.0)
MCH: 31.8 pg (ref 26.0–34.0)
MCHC: 31.3 g/dL (ref 30.0–36.0)
MCV: 101.4 fL — ABNORMAL HIGH (ref 80.0–100.0)
Monocytes Absolute: 0.3 10*3/uL (ref 0.1–1.0)
Monocytes Relative: 8 %
Neutro Abs: 2.3 10*3/uL (ref 1.7–7.7)
Neutrophils Relative %: 54 %
Platelets: 148 10*3/uL — ABNORMAL LOW (ref 150–400)
RBC: 2.96 MIL/uL — ABNORMAL LOW (ref 3.87–5.11)
RDW: 14.7 % (ref 11.5–15.5)
WBC: 4.2 10*3/uL (ref 4.0–10.5)
nRBC: 0 % (ref 0.0–0.2)

## 2024-03-23 LAB — COMPREHENSIVE METABOLIC PANEL WITH GFR
ALT: 13 U/L (ref 0–44)
AST: 17 U/L (ref 15–41)
Albumin: 2.9 g/dL — ABNORMAL LOW (ref 3.5–5.0)
Alkaline Phosphatase: 86 U/L (ref 38–126)
Anion gap: 10 (ref 5–15)
BUN: 13 mg/dL (ref 8–23)
CO2: 25 mmol/L (ref 22–32)
Calcium: 8.7 mg/dL — ABNORMAL LOW (ref 8.9–10.3)
Chloride: 104 mmol/L (ref 98–111)
Creatinine, Ser: 0.95 mg/dL (ref 0.44–1.00)
GFR, Estimated: 59 mL/min — ABNORMAL LOW (ref >60)
Glucose, Bld: 166 mg/dL — ABNORMAL HIGH (ref 70–99)
Potassium: 3.2 mmol/L — ABNORMAL LOW (ref 3.5–5.1)
Sodium: 139 mmol/L (ref 135–145)
Total Bilirubin: 1 mg/dL (ref 0.0–1.2)
Total Protein: 6.3 g/dL — ABNORMAL LOW (ref 6.5–8.1)

## 2024-03-23 LAB — MAGNESIUM: Magnesium: 1.6 mg/dL — ABNORMAL LOW (ref 1.7–2.4)

## 2024-03-23 MED ORDER — POTASSIUM CHLORIDE CRYS ER 20 MEQ PO TBCR
40.0000 meq | EXTENDED_RELEASE_TABLET | Freq: Once | ORAL | Status: AC
  Administered 2024-03-23: 40 meq via ORAL
  Filled 2024-03-23: qty 2

## 2024-03-23 MED ORDER — DEXAMETHASONE 4 MG PO TABS
20.0000 mg | ORAL_TABLET | Freq: Once | ORAL | Status: AC
  Administered 2024-03-23: 20 mg via ORAL
  Filled 2024-03-23: qty 5

## 2024-03-23 MED ORDER — EPOETIN ALFA-EPBX 10000 UNIT/ML IJ SOLN
20000.0000 [IU] | Freq: Once | INTRAMUSCULAR | Status: AC
  Administered 2024-03-23: 20000 [IU] via SUBCUTANEOUS
  Filled 2024-03-23: qty 2

## 2024-03-23 MED ORDER — BORTEZOMIB CHEMO SQ INJECTION 3.5 MG (2.5MG/ML)
1.3000 mg/m2 | Freq: Once | INTRAMUSCULAR | Status: AC
  Administered 2024-03-23: 2.25 mg via SUBCUTANEOUS
  Filled 2024-03-23: qty 0.9

## 2024-03-23 MED ORDER — PROCHLORPERAZINE MALEATE 10 MG PO TABS
10.0000 mg | ORAL_TABLET | Freq: Once | ORAL | Status: AC
  Administered 2024-03-23: 10 mg via ORAL
  Filled 2024-03-23: qty 1

## 2024-03-23 NOTE — Progress Notes (Signed)
Patient is taking Revlimid as prescribed.  She has not missed any doses and reports no side effects at this time.    Patient has been examined by Dr. Katragadda. Vital signs and labs have been reviewed by MD - ANC, Creatinine, LFTs, hemoglobin, and platelets are within treatment parameters per M.D. - pt may proceed with treatment.  Primary RN and pharmacy notified.  

## 2024-03-23 NOTE — Patient Instructions (Signed)
 [facility] [place] [facility]  Discharge Instructions: Thank you for choosing [facility] to provide your oncology and hematology care.  If you have a lab appointment with the Cancer Center - please note that after April 8th, 2024, all labs will be drawn in the cancer center.  You do not have to check in or register with the main entrance as you have in the past but will complete your check-in in the cancer center.  Wear comfortable clothing and clothing appropriate for easy access to any Portacath or PICC line.   We strive to give you quality time with your provider. You may need to reschedule your appointment if you arrive late (15 or more minutes).  Arriving late affects you and other patients whose appointments are after yours.  Also, if you miss three or more appointments without notifying the office, you may be dismissed from the clinic at the provider's discretion.      For prescription refill requests, have your pharmacy contact our office and allow 72 hours for refills to be completed.    Today you received the following chemotherapy and/or immunotherapy agents Velcade and Retacrit 20,000U injections   To help prevent nausea and vomiting after your treatment, we encourage you to take your nausea medication as directed.  BELOW ARE SYMPTOMS THAT SHOULD BE REPORTED IMMEDIATELY: *FEVER GREATER THAN 100.4 F (38 C) OR HIGHER *CHILLS OR SWEATING *NAUSEA AND VOMITING THAT IS NOT CONTROLLED WITH YOUR NAUSEA MEDICATION *UNUSUAL SHORTNESS OF BREATH *UNUSUAL BRUISING OR BLEEDING *URINARY PROBLEMS (pain or burning when urinating, or frequent urination) *BOWEL PROBLEMS (unusual diarrhea, constipation, pain near the anus) TENDERNESS IN MOUTH AND THROAT WITH OR WITHOUT PRESENCE OF ULCERS (sore throat, sores in mouth, or a toothache) UNUSUAL RASH, SWELLING OR PAIN  UNUSUAL VAGINAL DISCHARGE OR ITCHING   Items with * indicate a potential  emergency and should be followed up as soon as possible or go to the Emergency Department if any problems should occur.  Please show the CHEMOTHERAPY ALERT CARD or IMMUNOTHERAPY ALERT CARD at check-in to the Emergency Department and triage nurse.  Should you have questions after your visit or need to cancel or reschedule your appointment, please contact [facility] [place] - A DEPT OF [facility]room [facility] [phone]  and follow the prompts.  Office hours are 8:00 a.m. to 4:30 p.m. Monday - Friday. Please note that voicemails left after 4:00 p.m. may not be returned until the following business day.  We are closed weekends and major holidays. You have access to a nurse at all times for urgent questions. Please call the main number to the clinic [phone] and follow the prompts.  For any non-urgent questions, you may also contact your provider using MyChart. We now offer e-Visits for anyone 45 and older to request care online for non-urgent symptoms. For details visit [email or web address].   Also download the MyChart app! Go to the app store, search "MyChart", open the app, select [place], and log in with your MyChart username and password.

## 2024-03-23 NOTE — Progress Notes (Signed)
 [facility] [address] [place] [address]    Clinic Day:  03/23/2024  Referring physician: [name], MD  Patient Care Team: [name], MD as PCP - General (Internal Medicine) [name], MD as Medical Oncologist (Hematology)   ASSESSMENT & PLAN:   Assessment: 1.  IgA kappa plasma cell myeloma, stage I: -RVD started on 01/09/2018, held since 02/14/2020 due to mandible abscess. -Myeloma labs on 05/14/2020 showed progression with M spike of 0.5 g. -RVD started back on 05/21/2020. -Myeloma labs on 07/10/2020 shows M spike improved to 0.3 g from 0.6 g previously.  Free light chain ratio is 1.74 with kappa light chains 28.4. -Myeloma panel from 08/14/2020 shows M spike 0.4 g.  Kappa light chains are 24.8 and ratio is 2.23. - Revlimid (20 mg 2 weeks on/1 week off) and Velcade (3 weeks on/1 week off) were held after 02/26/2022 due to weight loss. - Velcade maintenance every 2 weeks started on 04/13/2022.   2.  Osteomyelitis of the right mandible/dental abscess: -Finished IV ceftriaxone on 04/03/2020.  Finished oral antibiotics. -We will hold Xgeva indefinitely.    Plan: 1.  IgA kappa plasma cell myeloma, stage I: - PET scan (12/16/2023): No signs of bone lesions of myeloma.  Tracer avid soft tissue lesion within the periareolar left breast 1.9 cm with SUV 6.1 concerning for malignancy.  Several morphological benign-appearing lymph nodes in the right axilla with mild increase in tracer uptake may be related to infiltration at the injection site.  Small focus of asymmetric uptake in the left temporal lobe gray-white matter junction. - She had mammogram on 03/09/2024: Biopsy of the left breast mass as well as left axillary lymph nodes and stereotactic-guided core biopsy of the outer right breast were recommended. - She started back on Revlimid 10 mg 3 weeks on/1 week off on 01/11/2024. - She reports that she is tolerating well.  Reviewed myeloma labs from  03/09/2024: M spike is stable at 0.5 g.  However free light chain ratio improved to 2.89 with kappa light chains 113. - Continue Velcade every 2 weeks.  Continue Revlimid 10 mg 3 weeks on 1 week off.  RTC 8 weeks for follow-up with repeat myeloma labs 2 weeks prior.   2.  Severe hypokalemia: - Continue potassium 3 times daily.  Potassium is mildly low at 3.2.   3.  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   4.  Hypomagnesemia: - She will continue magnesium 3 times daily.  Magnesium is 1.6 today.   5.  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  Hemoglobin is 9.4 and slightly dropped since Revlimid started back.  Will check ferritin and iron panel at next visit.    No orders of the defined types were placed in this encounter.     Alben Deeds Teague,acting as a Neurosurgeon for [name], MD.,have documented all relevant documentation on the behalf of [name], MD,as directed by  [name], MD while in the presence of [name], MD.  I, [name] MD, have reviewed the above documentation for accuracy and completeness, and I agree with the above.       [name], MD   3/27/20252:01 PM  CHIEF COMPLAINT:   Diagnosis: multiple myeloma    Cancer Staging  No matching staging information was found for the patient.    Prior Therapy: RVD 01/09/18 through 02/14/20, 05/21/20 through 02/26/22   Current Therapy:  Maintenance Velcade every other week.    HISTORY OF PRESENT ILLNESS:   Oncology History  Multiple myeloma not having achieved remission (HCC)  01/20/2018 Initial Diagnosis   Multiple myeloma not having achieved remission (HCC)   01/26/2018 - 09/01/2022 Chemotherapy   Patient is on Treatment Plan : MYELOMA  RVD SQ (Bortezomib d 1,8,15 ) q28d x 4 cycles     04/13/2022 -  Chemotherapy   Patient is on Treatment Plan : MYELOMA MAINTENANCE  Bortezomib SQ q14d        INTERVAL HISTORY:   Brittany Archer is a 83 y.o. female presenting to clinic today for follow up of multiple myeloma. She was last seen by me on 02/10/24.  Since her last visit, she underwent bilateral diagnostic mammogram and [place] on 03/09/24 that found: Suspicious palpable 2.2 cm mass in the left breast at 9 o'clock retroareolar. Suspicious 0.7 cm mass/probable lymph node in the left axilla. Suspicious segmental calcifications involving the outer right breast spanning 8.6 cm.  Today, she states that she is doing well overall. Her appetite level is at 100%. Her energy level is at 70%. She is accompanied by her daughter in person and her sister on the phone. Her sister states she has started Revlimid in mid-February 2025. She denies any infections in the last 2 months.   PAST MEDICAL HISTORY:   Past Medical History: Past Medical History:  Diagnosis Date   Breast cancer (HCC)    left breast/ 2008/ surg/ rad tx   Coronary artery disease    Diabetes mellitus     Surgical History: Past Surgical History:  Procedure Laterality Date   ABDOMINAL HYSTERECTOMY     BREAST SURGERY     DEBRIDEMENT MANDIBLE N/A 02/22/2020   Procedure: INCISION AND DRAINAGE WITH DEBRIDEMENT MANDIBLE;  Surgeon: [name], DMD;  Location: [facility];  Service: Oral Surgery;  Laterality: N/A;   DEBRIDEMENT MANDIBLE Right 03/19/2021   Procedure: DEBRIDEMENT OF BONE RIGHT INTERIOR  MANDIBLE;  Surgeon: [name], DMD;  Location: [facility] OR;  Service: Oral Surgery;  Laterality: Right;   EYE SURGERY  2021   cataract removals    TOOTH EXTRACTION N/A 02/22/2020   Procedure: DENTAL RESTORATION/EXTRACTIONS;  Surgeon: [name], DMD;  Location: [facility];  Service: Oral Surgery;  Laterality: N/A;  DENTAL KIT REQUESTED    Social History: Social History   Socioeconomic History   Marital status: Divorced    Spouse name: Not on file   Number of children: Not on file   Years of education: Not on file   Highest  education level: Not on file  Occupational History   Not on file  Tobacco Use   Smoking status: Never   Smokeless tobacco: Never  Vaping Use   Vaping status: Never Used  Substance and Sexual Activity   Alcohol use: No   Drug use: No   Sexual activity: Yes    Birth control/protection: Surgical  Other Topics Concern   Not on file  Social History Narrative   Not on file   Social Drivers of Health   Financial Resource Strain: Low Risk  (12/09/2020)   Overall Financial Resource Strain (CARDIA)    Difficulty of Paying Living Expenses: Not hard at all  Food Insecurity: No Food Insecurity (12/09/2020)   Hunger Vital Sign    Worried About Running Out of Food in the Last Year: Never true    Ran Out of Food in the Last Year: Never true  Transportation Needs: No Transportation Needs (12/09/2020)  PRAPARE - Administrator, Civil Service (Medical): No    Lack of Transportation (Non-Medical): No  Physical Activity: Inactive (12/09/2020)   Exercise Vital Sign    Days of Exercise per Week: 0 days    Minutes of Exercise per Session: 0 min  Stress: No Stress Concern Present (12/09/2020)   [employer] of Occupational Health - Occupational Stress Questionnaire    Feeling of Stress : Not at all  Social Connections: Moderately Isolated (12/09/2020)   Social Connection and Isolation Panel [NHANES]    Frequency of Communication with Friends and Family: More than three times a week    Frequency of Social Gatherings with Friends and Family: More than three times a week    Attends Religious Services: More than 4 times per year    Active Member of [employer] or Organizations: No    Attends Banker Meetings: Never    Marital Status: Divorced  Catering manager Violence: Not At Risk (12/09/2020)   Humiliation, Afraid, Rape, and Kick questionnaire    Fear of Current or Ex-Partner: No    Emotionally Abused: No    Physically Abused: No    Sexually Abused: No    Family  History: Family History  Problem Relation Age of Onset   Obesity Sister     Current Medications:  Current Outpatient Medications:    acyclovir (ZOVIRAX) 400 MG tablet, Take 400 mg by mouth 2 (two) times daily., Disp: , Rfl:    aspirin 81 MG tablet, Take 1 tablet (81 mg total) by mouth daily with breakfast., Disp: 30 tablet, Rfl: 5   atorvastatin (LIPITOR) 10 MG tablet, Take 10 mg by mouth daily., Disp: , Rfl:    bortezomib IV (VELCADE) 3.5 MG injection, 3.5 mg once a week. weekly, Disp: , Rfl:    chlorhexidine (PERIDEX) 0.12 % solution, Use as directed 15 mLs in the mouth or throat 3 (three) times daily., Disp: , Rfl:    cholecalciferol (VITAMIN D) 25 MCG (1000 UNIT) tablet, 1 capsule, Disp: , Rfl:    famotidine (PEPCID) 20 MG tablet, Take 20 mg by mouth at bedtime as needed., Disp: , Rfl:    glucose blood (ACCU-CHEK AVIVA PLUS) test strip, CHECK BLOOD SUGAR ONCE DAILY, Disp: , Rfl:    GNP ASPIRIN LOW DOSE 81 MG tablet, Take 81 mg by mouth daily., Disp: , Rfl:    lenalidomide (REVLIMID) 10 MG capsule, Take 1 capsule (10 mg total) by mouth daily. 21 days on, 7 days off, Disp: 21 capsule, Rfl: 0   magnesium oxide (MAG-OX) 400 (240 Mg) MG tablet, TAKE ONE TABLET BY MOUTH THREE TIMES A DAY, Disp: 90 tablet, Rfl: 3   metFORMIN (GLUCOPHAGE-XR) 500 MG 24 hr tablet, SMARTSIG:1 Tablet(s) By Mouth Every Evening, Disp: , Rfl:    mirtazapine (REMERON) 15 MG tablet, Take 0.5 tablets (7.5 mg total) by mouth at bedtime. For appetite stimulation, Disp: 30 tablet, Rfl: 2   ondansetron (ZOFRAN) 8 MG tablet, , Disp: , Rfl:    Potassium Acetate POWD, Take 20 mEq by mouth daily., Disp: 12000 g, Rfl: 5   potassium chloride (KLOR-CON) 20 MEQ packet, MIX AND TAKE ONE PACKET DAILY., Disp: 30 packet, Rfl: 3   potassium chloride SA (KLOR-CON M) 20 MEQ tablet, Take by mouth., Disp: , Rfl:    Allergies: Allergies  Allergen Reactions   Other Other (See Comments)   Seasonal Ic [Cholestatin] Other (See Comments)     Sneezing, watery eyes   Motrin [Ibuprofen] Rash  REVIEW OF SYSTEMS:   Review of Systems  Constitutional:  Negative for chills, fatigue and fever.  HENT:   Negative for lump/mass, mouth sores, nosebleeds, sore throat and trouble swallowing.   Eyes:  Negative for eye problems.  Respiratory:  Negative for cough and shortness of breath.   Cardiovascular:  Negative for chest pain, leg swelling and palpitations.  Gastrointestinal:  Negative for abdominal pain, constipation, diarrhea, nausea and vomiting.  Genitourinary:  Negative for bladder incontinence, difficulty urinating, dysuria, frequency, hematuria and nocturia.   Musculoskeletal:  Negative for arthralgias, back pain, flank pain, myalgias and neck pain.  Skin:  Negative for itching and rash.  Neurological:  Negative for dizziness, headaches and numbness.  Hematological:  Does not bruise/bleed easily.  Psychiatric/Behavioral:  Negative for depression, sleep disturbance and suicidal ideas. The patient is not nervous/anxious.   All other systems reviewed and are negative.    VITALS:   Blood pressure (!) 165/65, pulse (!) 59, temperature (!) 97.1 F (36.2 C), temperature source Tympanic, resp. rate 17, height 5\' 4"  (1.626 m), weight 157 lb (71.2 kg), SpO2 100%.  Wt Readings from Last 3 Encounters:  03/23/24 157 lb (71.2 kg)  03/09/24 157 lb 12.8 oz (71.6 kg)  02/24/24 162 lb (73.5 kg)    Body mass index is 26.95 kg/m.  Performance status (ECOG): 1 - Symptomatic but completely ambulatory  PHYSICAL EXAM:   Physical Exam Vitals and nursing note reviewed. Exam conducted with a chaperone present.  Constitutional:      Appearance: Normal appearance.  Cardiovascular:     Rate and Rhythm: Normal rate and regular rhythm.     Pulses: Normal pulses.     Heart sounds: Normal heart sounds.  Pulmonary:     Effort: Pulmonary effort is normal.     Breath sounds: Normal breath sounds.  Abdominal:     Palpations: Abdomen is soft.  There is no hepatomegaly, splenomegaly or mass.     Tenderness: There is no abdominal tenderness.  Musculoskeletal:     Right lower leg: No edema.     Left lower leg: No edema.  Lymphadenopathy:     Cervical: No cervical adenopathy.     Right cervical: No superficial, deep or posterior cervical adenopathy.    Left cervical: No superficial, deep or posterior cervical adenopathy.     Upper Body:     Right upper body: No supraclavicular or axillary adenopathy.     Left upper body: No supraclavicular or axillary adenopathy.  Neurological:     General: No focal deficit present.     Mental Status: She is alert and oriented to person, place, and time.  Psychiatric:        Mood and Affect: Mood normal.        Behavior: Behavior normal.     LABS:      Latest Ref Rng & Units 03/23/2024   10:47 AM 03/09/2024   11:55 AM 02/24/2024   12:54 PM  CBC  WBC 4.0 - 10.5 K/uL 4.2  6.7  6.9   Hemoglobin 12.0 - 15.0 g/dL 9.4  16.1  09.6   Hematocrit 36.0 - 46.0 % 30.0  31.3  33.1   Platelets 150 - 400 K/uL 148  274  294       Latest Ref Rng & Units 03/23/2024   10:47 AM 03/09/2024   11:55 AM 02/24/2024   12:54 PM  CMP  Glucose 70 - 99 mg/dL 045  409  811   BUN 8 -  23 mg/dL 13  12  13    Creatinine 0.44 - 1.00 mg/dL 2.95  6.21  3.08   Sodium 135 - 145 mmol/L 139  141  136   Potassium 3.5 - 5.1 mmol/L 3.2  3.7  2.9   Chloride 98 - 111 mmol/L 104  105  99   CO2 22 - 32 mmol/L 25  26  24    Calcium 8.9 - 10.3 mg/dL 8.7  9.3  9.2   Total Protein 6.5 - 8.1 g/dL 6.3  7.0  7.2   Total Bilirubin 0.0 - 1.2 mg/dL 1.0  0.5  1.2   Alkaline Phos 38 - 126 U/L 86  93  90   AST 15 - 41 U/L 17  16  15    ALT 0 - 44 U/L 13  10  8       No results found for: "CEA1", "CEA" / No results found for: "CEA1", "CEA" No results found for: "PSA1" No results found for: "MVH846" No results found for: "CAN125"  Lab Results  Component Value Date   TOTALPROTELP 6.6 03/09/2024   ALBUMINELP 2.9 03/09/2024   A1GS 0.2  03/09/2024   A2GS 1.0 03/09/2024   BETS 1.4 (H) 03/09/2024   GAMS 1.2 03/09/2024   MSPIKE 0.5 (H) 03/09/2024   SPEI Comment 03/09/2024   Lab Results  Component Value Date   TIBC 137 (L) 05/20/2023   TIBC 117 (L) 03/24/2023   TIBC 107 (L) 01/26/2023   FERRITIN 267 05/20/2023   FERRITIN 257 03/24/2023   FERRITIN 339 (H) 01/26/2023   IRONPCTSAT 33 (H) 05/20/2023   IRONPCTSAT 44 (H) 03/24/2023   IRONPCTSAT 50 (H) 01/26/2023   Lab Results  Component Value Date   LDH 137 05/20/2023   LDH 134 03/24/2023   LDH 125 01/26/2023     STUDIES:   MM 3D DIAGNOSTIC MAMMOGRAM BILATERAL BREAST Result Date: 03/09/2024 CLINICAL DATA:  83 year old female with recent PET scan for multiple myeloma with findings demonstrating radiotracer uptake in the periareolar left breast. EXAM: DIGITAL DIAGNOSTIC BILATERAL MAMMOGRAM WITH TOMOSYNTHESIS AND CAD; ULTRASOUND LEFT BREAST LIMITED TECHNIQUE: Bilateral digital diagnostic mammography and breast tomosynthesis was performed. The images were evaluated with computer-aided detection. ; Targeted ultrasound examination of the left breast was performed. COMPARISON:  Previous exam(s), with most recent mammography performed in 2018. ACR Breast Density Category c: The breasts are heterogeneously dense, which may obscure small masses. FINDINGS: Spot compression magnification views of the outer right breast demonstrate new segmental linear oriented pleomorphic type calcifications spanning 8.6 cm. There is flattening of the left nipple with a new ill-defined mass in the slightly inner retroareolar left breast measuring 1.6 cm. Physical examination reveals a hard mass involving the retroareolar to slightly inner left breast. Targeted ultrasound of the left breast was performed. There is an irregular hypoechoic mass in the left breast at 9 o'clock retroareolar measuring 2.2 x 1 x 1.6 cm. This corresponds the mass seen in the left breast at mammography. There is an irregular mass in  the left axilla likely representing an abnormal lymph node measuring 0.6 x 0.5 x 0.7 cm. Postsurgical changes are present slightly inferior medial to the small mass in the left axilla. IMPRESSION: 1. Suspicious palpable 2.2 cm mass in the left breast at 9 o'clock retroareolar. 2.  Suspicious 0.7 cm mass/probable lymph node in the left axilla. 3. Suspicious segmental calcifications involving the outer right breast spanning 8.6 cm. RECOMMENDATION: 1. Recommend ultrasound-guided core biopsy of the mass in the left breast at  9 o'clock retroareolar. 2. Recommend ultrasound-guided core biopsy of the mass/probable lymph node in the left axilla. 3. Recommend 2 site stereotactic guided core biopsy of the calcifications in the outer right breast, to include the most anterior and most posterior extents of these calcifications. The patient and her sister were both informed of the findings and recommendations. They prefer to have these biopsies all occur on the same day and therefore she will be scheduled at the breast Center of [facility] for the ultrasound-guided core biopsies of the left breast as well as stereotactic guided biopsies of the right breast. I have discussed the findings and recommendations with the patient. If applicable, a reminder letter will be sent to the patient regarding the next appointment. BI-RADS CATEGORY  5: Highly suggestive of malignancy. Electronically Signed   By: Edwin Cap M.D.   On: 03/09/2024 15:47   [place] LIMITED ULTRASOUND INCLUDING AXILLA LEFT BREAST  Result Date: 03/09/2024 CLINICAL DATA:  83 year old female with recent PET scan for multiple myeloma with findings demonstrating radiotracer uptake in the periareolar left breast. EXAM: DIGITAL DIAGNOSTIC BILATERAL MAMMOGRAM WITH TOMOSYNTHESIS AND CAD; ULTRASOUND LEFT BREAST LIMITED TECHNIQUE: Bilateral digital diagnostic mammography and breast tomosynthesis was performed. The images were evaluated with computer-aided detection. ;  Targeted ultrasound examination of the left breast was performed. COMPARISON:  Previous exam(s), with most recent mammography performed in 2018. ACR Breast Density Category c: The breasts are heterogeneously dense, which may obscure small masses. FINDINGS: Spot compression magnification views of the outer right breast demonstrate new segmental linear oriented pleomorphic type calcifications spanning 8.6 cm. There is flattening of the left nipple with a new ill-defined mass in the slightly inner retroareolar left breast measuring 1.6 cm. Physical examination reveals a hard mass involving the retroareolar to slightly inner left breast. Targeted ultrasound of the left breast was performed. There is an irregular hypoechoic mass in the left breast at 9 o'clock retroareolar measuring 2.2 x 1 x 1.6 cm. This corresponds the mass seen in the left breast at mammography. There is an irregular mass in the left axilla likely representing an abnormal lymph node measuring 0.6 x 0.5 x 0.7 cm. Postsurgical changes are present slightly inferior medial to the small mass in the left axilla. IMPRESSION: 1. Suspicious palpable 2.2 cm mass in the left breast at 9 o'clock retroareolar. 2.  Suspicious 0.7 cm mass/probable lymph node in the left axilla. 3. Suspicious segmental calcifications involving the outer right breast spanning 8.6 cm. RECOMMENDATION: 1. Recommend ultrasound-guided core biopsy of the mass in the left breast at 9 o'clock retroareolar. 2. Recommend ultrasound-guided core biopsy of the mass/probable lymph node in the left axilla. 3. Recommend 2 site stereotactic guided core biopsy of the calcifications in the outer right breast, to include the most anterior and most posterior extents of these calcifications. The patient and her sister were both informed of the findings and recommendations. They prefer to have these biopsies all occur on the same day and therefore she will be scheduled at the breast Center of [facility] for  the ultrasound-guided core biopsies of the left breast as well as stereotactic guided biopsies of the right breast. I have discussed the findings and recommendations with the patient. If applicable, a reminder letter will be sent to the patient regarding the next appointment. BI-RADS CATEGORY  5: Highly suggestive of malignancy. Electronically Signed   By: Edwin Cap M.D.   On: 03/09/2024 15:47

## 2024-03-23 NOTE — Progress Notes (Signed)
 Patient presents today for Velcade and Retacit 20,000U infusion. Patient is in satisfactory condition with no new complaints voiced.  Vital signs are stable.  Labs reviewed by Dr. Ellin Saba during the office visit and all labs are within treatment parameters. Patient will receive 40 mEq p.o x 1 dose per Dr.Katragadda's standing orders. We will proceed with treatment per MD orders. Patient's hemoglobin noted to be 9.4 today.  Treatment given today per MD orders. Tolerated infusion without adverse affects. Vital signs stable. No complaints at this time. Discharged from clinic ambulatory in stable condition. Alert and oriented x 3. F/U with [facility] as scheduled.

## 2024-03-24 ENCOUNTER — Ambulatory Visit
Admission: RE | Admit: 2024-03-24 | Discharge: 2024-03-24 | Disposition: A | Discharge status: Home / Self Care | Source: Ambulatory Visit | Attending: Hematology | Admitting: Hematology

## 2024-03-24 ENCOUNTER — Other Ambulatory Visit: Payer: Self-pay

## 2024-03-24 DIAGNOSIS — C9000 Multiple myeloma not having achieved remission: Secondary | ICD-10-CM

## 2024-03-24 DIAGNOSIS — R921 Mammographic calcification found on diagnostic imaging of breast: Secondary | ICD-10-CM

## 2024-03-24 DIAGNOSIS — R9389 Abnormal findings on diagnostic imaging of other specified body structures: Secondary | ICD-10-CM

## 2024-03-24 HISTORY — PX: BREAST BIOPSY: SHX20

## 2024-03-24 MED ORDER — LENALIDOMIDE 10 MG PO CAPS: 10.0000 mg | ORAL_CAPSULE | Freq: Every day | ORAL | 0 refills | Status: DC

## 2024-03-24 NOTE — Telephone Encounter (Signed)
 Chart reviewed. Revlimid refilled per last office note with Dr. Ellin Saba.

## 2024-03-27 LAB — SURGICAL PATHOLOGY

## 2024-03-29 ENCOUNTER — Ambulatory Visit: Admitting: Hematology

## 2024-03-30 ENCOUNTER — Inpatient Hospital Stay: Attending: Hematology | Admitting: Hematology

## 2024-03-30 VITALS — BP 150/70 | HR 56 | Temp 97.7°F | Resp 16 | Ht 62.01 in | Wt 157.4 lb

## 2024-03-30 DIAGNOSIS — D0511 Intraductal carcinoma in situ of right breast: Secondary | ICD-10-CM | POA: Insufficient documentation

## 2024-03-30 DIAGNOSIS — C50212 Malignant neoplasm of upper-inner quadrant of left female breast: Secondary | ICD-10-CM | POA: Diagnosis not present

## 2024-03-30 DIAGNOSIS — Z803 Family history of malignant neoplasm of breast: Secondary | ICD-10-CM | POA: Insufficient documentation

## 2024-03-30 DIAGNOSIS — C9000 Multiple myeloma not having achieved remission: Secondary | ICD-10-CM | POA: Insufficient documentation

## 2024-03-30 DIAGNOSIS — Z86000 Personal history of in-situ neoplasm of breast: Secondary | ICD-10-CM | POA: Insufficient documentation

## 2024-03-30 DIAGNOSIS — Z1721 Progesterone receptor positive status: Secondary | ICD-10-CM | POA: Insufficient documentation

## 2024-03-30 DIAGNOSIS — Z1501 Genetic susceptibility to malignant neoplasm of breast: Secondary | ICD-10-CM

## 2024-03-30 DIAGNOSIS — C50811 Malignant neoplasm of overlapping sites of right female breast: Secondary | ICD-10-CM

## 2024-03-30 DIAGNOSIS — Z5112 Encounter for antineoplastic immunotherapy: Secondary | ICD-10-CM | POA: Diagnosis present

## 2024-03-30 DIAGNOSIS — E876 Hypokalemia: Secondary | ICD-10-CM | POA: Diagnosis not present

## 2024-03-30 DIAGNOSIS — D631 Anemia in chronic kidney disease: Secondary | ICD-10-CM | POA: Diagnosis not present

## 2024-03-30 DIAGNOSIS — Z1732 Human epidermal growth factor receptor 2 negative status: Secondary | ICD-10-CM | POA: Insufficient documentation

## 2024-03-30 DIAGNOSIS — Z8051 Family history of malignant neoplasm of kidney: Secondary | ICD-10-CM | POA: Insufficient documentation

## 2024-03-30 DIAGNOSIS — Z170 Estrogen receptor positive status [ER+]: Secondary | ICD-10-CM | POA: Diagnosis not present

## 2024-03-30 DIAGNOSIS — N189 Chronic kidney disease, unspecified: Secondary | ICD-10-CM

## 2024-03-30 DIAGNOSIS — Z800 Family history of malignant neoplasm of digestive organs: Secondary | ICD-10-CM | POA: Diagnosis not present

## 2024-03-30 DIAGNOSIS — C50911 Malignant neoplasm of unspecified site of right female breast: Secondary | ICD-10-CM | POA: Insufficient documentation

## 2024-03-30 DIAGNOSIS — N1832 Chronic kidney disease, stage 3b: Secondary | ICD-10-CM | POA: Diagnosis not present

## 2024-03-30 DIAGNOSIS — Z79899 Other long term (current) drug therapy: Secondary | ICD-10-CM

## 2024-03-30 MED ORDER — ANASTROZOLE 1 MG PO TABS: 1.0000 mg | ORAL_TABLET | Freq: Every day | ORAL | 6 refills | Status: DC

## 2024-03-30 NOTE — Patient Instructions (Addendum)
 [facility]  Discharge Instructions  You were seen and examined today by Dr. Ellin Saba.   You were referred back to Dr. Ellin Saba due to a new finding of breast cancer in both your right and left side, including at least one lymph node on your left side. 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  Typically, this cancer is treated with surgery, radiation and anti-estrogen therapy. Given that you have Multiple Myeloma and require lifelong treatment for that, Dr. Ellin Saba discussed the option of foregoing surgery and going on anti-estrogen therapy to shrink the cancer. If you wish to pursue surgery, we can send a referral to [facility] here in [place].  Dr. Kirtland Bouchard does not recommend you going for surgery, as this would require you coming off your treatment for multiple myeloma.   The cancer you have feeds on estrogen, so we will send a pill called anastrozole (an estrogen blocking pill) to your pharmacy. This pill needs to be taken every day. This will help control the cancer. These pills can also weaken your bones. We will arrange for you to have a bone density test.   We will also arrange for you to have genetic testing since there is a very strong family history of breast cancer. We will refer you to a geneticist.   Return as scheduled.       Thank you for choosing [facility] to provide your oncology and hematology care.   To afford each patient quality time with our provider, please arrive at least 15 minutes before your scheduled appointment time. You may need to reschedule your appointment if you arrive late (10 or more minutes). Arriving late affects you and other patients whose appointments are after yours.  Also, if you miss three or more appointments without notifying the office, you may be dismissed from the clinic at the  provider's discretion.    Again, thank you for choosing [facility].  Our hope is that these requests will decrease the amount of time that you wait before being seen by our physicians.   If you have a lab appointment with the Cancer Center - please note that after April 8th, all labs will be drawn in the cancer center.  You do not have to check in or register with the main entrance as you have in the past but will complete your check-in at the cancer center.            _____________________________________________________________  Should you have questions after your visit to [facility], please contact our office at [phone] and follow the prompts.  Our office hours are 8:00 a.m. to 4:30 p.m. Monday - Thursday and 8:00 a.m. to 2:30 p.m. Friday.  Please note that voicemails left after 4:00 p.m. may not be returned until the following business day.  We are closed weekends and all major holidays.  You do have access to a nurse 24-7, just call the main number to the clinic [phone] and do not press any options, hold on the line and a nurse will answer the phone.    For prescription refill requests, have your pharmacy contact our office and allow 72 hours.    Masks are no longer required in the cancer centers. If you would like for your care team to wear a mask while they are taking  care of you, please let them know. You may have one support person who is at least 84 years old accompany you for your appointments.

## 2024-03-30 NOTE — Progress Notes (Signed)
 [facility] [address], [place] [address]   Clinic Day:  03/30/2024  Referring physician: [name], MD  Patient Care Team: [name], MD as PCP - General (Internal Medicine) [name], MD as Medical Oncologist (Hematology)   ASSESSMENT & PLAN:   Assessment:  1.  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  In the left axilla there is mild tracer avid lymph node measuring 5 mm with SUV 2.3.  In the left breast there is a tracer avid soft tissue nodule within the periareolar region measuring 1.9 cm SUV 6.1.  No tracer avid mediastinal or hilar lymph nodes.  No bone lesions. - Diagnostic mammogram/ultrasound (03/10/2023): Suspicious palpable 2.2 cm mass in the left breast at 9:00 retroareolar.  Suspicious 0.7 cm mass/node in the left axilla. - Left breast 9:00 retroareolar mass biopsy (03/24/2021): IDC, grade 2, ER 95% strong, PR 60% strong, HER2 (0), Ki-67 15% - Left axillary lymph node biopsy: Metastatic carcinoma  2.  Right breast cancer, ER/PR+ G2 HER2 (1+): - Mammogram/ultrasound (03/09/2024): New segmental linear oriented pleomorphic type calcifications spanning 8.6 cm in the outer right breast. - Right breast biopsy outer posterior calcifications (03/16/2024): DCIS, grade 2. - Right breast biopsy outer anterior calcifications (03/16/2024): IDC, grade 2, ER 100% strong, PR 60% strong, HER2 (1+), Ki-67 10%  3.  IgA kappa multiple myeloma, standard risk: - RVD started on 01/09/2018  4.  Left breast DCIS: - S/p lumpectomy on 08/16/2007, 0.7 cm DCIS, high-grade, ER/PR positive - Treated with XRT  5.  Social/family history: - She lives at home with her sister and sister's husband.  She is independent of ADLs and some IADLs.  Her 2 children are deceased. - Sister had breast and colon cancer.  Mother had cancer,  type unknown to the patient.  Brother died of kidney cancer recently.  Another sister had breast cancer.   Plan:  1.  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 - We reviewed results of the imaging studies as well as biopsy results. - We discussed her new diagnosis in detail.  Ideally we would approach this with lumpectomy/mastectomy and lymph node biopsy bilaterally.  However given her recent worsening of multiple myeloma, if we stop her myeloma therapy, she may be at risk for progression.  Hence we would hold off on surgical management at this time.  Once her myeloma is stabilized, we will get her back to surgery. - We will start her on anastrozole 1 mg daily as her bilateral tumors were strongly ER positive.  We discussed side effects in detail including hot flashes and musculoskeletal symptoms. - Given her bilateral breast cancer and family history, strongly recommend genetic testing. - Will also obtain baseline DEXA scan.  2.  IgA kappa multiple myeloma, standard risk: - As her myeloma gotten recently worse, Revlimid was added back on 01/11/2024.  She is continuing Velcade every 2 weeks and tolerating it very well. - Reviewed myeloma labs from 03/09/2024: M spike is stable at 0.5 g.  FLC ratio has improved to 2.89 with improvement in kappa light chains to 113.8. - Continue Revlimid 10 mg 3 weeks on/1 week off and Velcade every 2 weeks. - RTC 6 weeks for follow-up with repeat myeloma labs.  3.  Severe hypokalemia/hypomagnesemia: - Continue potassium 3 times  daily and magnesium 3 times daily.  4.  Osteomyelitis of the right mandible/dental abscess: - Denosumab on hold indefinitely.  5.  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 - Latest CBC shows hemoglobin 9.4.  Will check ferritin and iron panel intermittently.   Orders Placed This Encounter  Procedures   DG Bone Density     Standing Status:   Future    Expiration Date:   03/30/2025    Reason for Exam (SYMPTOM  OR DIAGNOSIS REQUIRED):   anti-estrogen therapy    Preferred imaging location?:   [facility]   Genetic Screening Order    Standing Status:   Future    Expected Date:   04/13/2024    Expiration Date:   03/30/2025       [name], MD   4/3/20254:40 PM  CHIEF COMPLAINT/PURPOSE OF CONSULT:   Diagnosis: Bilateral ER positive breast cancer, multiple myeloma   Cancer Staging  Breast cancer of upper-inner quadrant of left female breast [facility]) Staging form: Breast, AJCC 8th Edition - Clinical stage from 03/30/2024: Stage IIA (cT2, cN1, cM0, G2, ER+, PR+, HER2-) - Unsigned  Breast cancer, right (HCC) Staging form: Breast, AJCC 8th Edition - Clinical stage from 03/30/2024: Stage IIA (cT3, cN0, cM0, G2, ER+, PR+, HER2-) - Unsigned    Prior Therapy: None  Current Therapy: Anastrozole   HISTORY OF PRESENT ILLNESS:   Oncology History  Multiple myeloma not having achieved remission (HCC)  01/20/2018 Initial Diagnosis   Multiple myeloma not having achieved remission (HCC)   01/26/2018 - 09/01/2022 Chemotherapy   Patient is on Treatment Plan : MYELOMA  RVD SQ (Bortezomib d 1,8,15 ) q28d x 4 cycles     04/13/2022 -  Chemotherapy   Patient is on Treatment Plan : MYELOMA MAINTENANCE Bortezomib SQ q14d         Brittany Archer is a 83 y.o. female presenting to clinic today for evaluation of newly diagnosed bilateral breast cancer at the request of breast center.  She was found to have incidental breast mass on PET scan for myeloma.  This was followed by diagnostic mammogram and ultrasound on 03/09/2024 and biopsies of both breasts on 03/24/2024.  She is here to discuss results and further plan with her sister.  She attained menarche around age 18.  She had TAH in her 50s.  She had 2 children who are deceased now.  Age at first childbirth was not clear but in her teens.  Never received hormone replacement  therapy or birth control pills.  She had previous history of left breast DCIS, underwent lumpectomy in August 2008 and radiation therapy.  Today, she states that she is doing well overall. Her appetite level is at 60%. Her energy level is at 20%.  PAST MEDICAL HISTORY:   Past Medical History: Past Medical History:  Diagnosis Date   Breast cancer (HCC)    left breast/ 2008/ surg/ rad tx   Coronary artery disease    Diabetes mellitus     Surgical History: Past Surgical History:  Procedure Laterality Date   ABDOMINAL HYSTERECTOMY     BREAST BIOPSY Left 03/24/2024   [place] LT BREAST BX W LOC DEV 1ST LESION IMG BX SPEC US GUIDE 03/24/2024 [facility]   BREAST BIOPSY Left 03/24/2024   [place] LT BREAST BX W LOC DEV EA ADD LESION IMG BX SPEC US GUIDE 03/24/2024 [facility]   BREAST BIOPSY Right 03/24/2024   MM RT BREAST BX W LOC DEV EA AD LESION  IMG BX SPEC STEREO GUIDE 03/24/2024 [facility]   BREAST BIOPSY Right 03/24/2024   MM RT BREAST BX W LOC DEV 1ST LESION IMAGE BX SPEC STEREO GUIDE 03/24/2024 [facility]   BREAST SURGERY     DEBRIDEMENT MANDIBLE N/A 02/22/2020   Procedure: INCISION AND DRAINAGE WITH DEBRIDEMENT MANDIBLE;  Surgeon: [name], DMD;  Location: [facility];  Service: Oral Surgery;  Laterality: N/A;   DEBRIDEMENT MANDIBLE Right 03/19/2021   Procedure: DEBRIDEMENT OF BONE RIGHT INTERIOR  MANDIBLE;  Surgeon: [name], DMD;  Location: [facility];  Service: Oral Surgery;  Laterality: Right;   EYE SURGERY  2021   cataract removals    TOOTH EXTRACTION N/A 02/22/2020   Procedure: DENTAL RESTORATION/EXTRACTIONS;  Surgeon: [name], DMD;  Location: [facility];  Service: Oral Surgery;  Laterality: N/A;  DENTAL KIT REQUESTED    Social History: Social History   Socioeconomic History   Marital status: Divorced    Spouse name: Not on file   Number of children: Not on file   Years of education: Not on file   Highest education level: Not on file  Occupational History    Not on file  Tobacco Use   Smoking status: Never   Smokeless tobacco: Never  Vaping Use   Vaping status: Never Used  Substance and Sexual Activity   Alcohol use: No   Drug use: No   Sexual activity: Yes    Birth control/protection: Surgical  Other Topics Concern   Not on file  Social History Narrative   Not on file   Social Drivers of Health   Financial Resource Strain: Low Risk  (12/09/2020)   Overall Financial Resource Strain (CARDIA)    Difficulty of Paying Living Expenses: Not hard at all  Food Insecurity: No Food Insecurity (12/09/2020)   Hunger Vital Sign    Worried About Running Out of Food in the Last Year: Never true    Ran Out of Food in the Last Year: Never true  Transportation Needs: No Transportation Needs (12/09/2020)   PRAPARE - Administrator, Civil Service (Medical): No    Lack of Transportation (Non-Medical): No  Physical Activity: Inactive (12/09/2020)   Exercise Vital Sign    Days of Exercise per Week: 0 days    Minutes of Exercise per Session: 0 min  Stress: No Stress Concern Present (12/09/2020)   [employer] of Occupational Health - Occupational Stress Questionnaire    Feeling of Stress : Not at all  Social Connections: Moderately Isolated (12/09/2020)   Social Connection and Isolation Panel [NHANES]    Frequency of Communication with Friends and Family: More than three times a week    Frequency of Social Gatherings with Friends and Family: More than three times a week    Attends Religious Services: More than 4 times per year    Active Member of [employer] or Organizations: No    Attends Banker Meetings: Never    Marital Status: Divorced  Catering manager Violence: Not At Risk (12/09/2020)   Humiliation, Afraid, Rape, and Kick questionnaire    Fear of Current or Ex-Partner: No    Emotionally Abused: No    Physically Abused: No    Sexually Abused: No    Family History: Family History  Problem Relation Age of  Onset   Obesity Sister     Current Medications:  Current Outpatient Medications:    acyclovir (ZOVIRAX) 400 MG tablet, Take 400 mg by mouth 2 (two) times daily., Disp: ,  Rfl:    anastrozole (ARIMIDEX) 1 MG tablet, Take 1 tablet (1 mg total) by mouth daily., Disp: 30 tablet, Rfl: 6   aspirin 81 MG tablet, Take 1 tablet (81 mg total) by mouth daily with breakfast., Disp: 30 tablet, Rfl: 5   atorvastatin (LIPITOR) 10 MG tablet, Take 10 mg by mouth daily., Disp: , Rfl:    bortezomib IV (VELCADE) 3.5 MG injection, 3.5 mg once a week. weekly, Disp: , Rfl:    chlorhexidine (PERIDEX) 0.12 % solution, Use as directed 15 mLs in the mouth or throat 3 (three) times daily., Disp: , Rfl:    cholecalciferol (VITAMIN D) 25 MCG (1000 UNIT) tablet, 1 capsule, Disp: , Rfl:    famotidine (PEPCID) 20 MG tablet, Take 20 mg by mouth at bedtime as needed., Disp: , Rfl:    glucose blood (ACCU-CHEK AVIVA PLUS) test strip, CHECK BLOOD SUGAR ONCE DAILY, Disp: , Rfl:    GNP ASPIRIN LOW DOSE 81 MG tablet, Take 81 mg by mouth daily., Disp: , Rfl:    lenalidomide (REVLIMID) 10 MG capsule, Take 1 capsule (10 mg total) by mouth daily. 21 days on, 7 days off, Disp: 21 capsule, Rfl: 0   magnesium oxide (MAG-OX) 400 (240 Mg) MG tablet, TAKE ONE TABLET BY MOUTH THREE TIMES A DAY, Disp: 90 tablet, Rfl: 3   metFORMIN (GLUCOPHAGE-XR) 500 MG 24 hr tablet, SMARTSIG:1 Tablet(s) By Mouth Every Evening, Disp: , Rfl:    mirtazapine (REMERON) 15 MG tablet, Take 0.5 tablets (7.5 mg total) by mouth at bedtime. For appetite stimulation, Disp: 30 tablet, Rfl: 2   ondansetron (ZOFRAN) 8 MG tablet, , Disp: , Rfl:    Potassium Acetate POWD, Take 20 mEq by mouth daily., Disp: 12000 g, Rfl: 5   potassium chloride (KLOR-CON) 20 MEQ packet, MIX AND TAKE ONE PACKET DAILY., Disp: 30 packet, Rfl: 3   potassium chloride SA (KLOR-CON M) 20 MEQ tablet, Take by mouth., Disp: , Rfl:    Allergies: Allergies  Allergen Reactions   Other Other (See  Comments)   Seasonal Ic [Cholestatin] Other (See Comments)    Sneezing, watery eyes   Motrin [Ibuprofen] Rash    REVIEW OF SYSTEMS:   Review of Systems  HENT:   Positive for trouble swallowing.   All other systems reviewed and are negative.    VITALS:   Blood pressure (!) 150/70, pulse (!) 56, temperature 97.7 F (36.5 C), temperature source Oral, resp. rate 16, height 5' 2.01" (1.575 m), weight 157 lb 6.5 oz (71.4 kg), SpO2 100%.  Wt Readings from Last 3 Encounters:  03/30/24 157 lb 6.5 oz (71.4 kg)  03/23/24 157 lb (71.2 kg)  03/09/24 157 lb 12.8 oz (71.6 kg)    Body mass index is 28.78 kg/m.  Performance status (ECOG): 1 - Symptomatic but completely ambulatory  PHYSICAL EXAM:   Physical Exam Vitals reviewed.  Constitutional:      Appearance: Normal appearance.  Cardiovascular:     Rate and Rhythm: Normal rate and regular rhythm.     Pulses: Normal pulses.     Heart sounds: Normal heart sounds.  Pulmonary:     Effort: Pulmonary effort is normal.     Breath sounds: Normal breath sounds.  Abdominal:     Palpations: Abdomen is soft.  Neurological:     General: No focal deficit present.     Mental Status: She is alert and oriented to person, place, and time.  Psychiatric:        Mood  and Affect: Mood normal.        Behavior: Behavior normal.     LABS:   CBC    Component Value Date/Time   WBC 4.2 03/23/2024 1047   RBC 2.96 (L) 03/23/2024 1047   HGB 9.4 (L) 03/23/2024 1047   HCT 30.0 (L) 03/23/2024 1047   PLT 148 (L) 03/23/2024 1047   MCV 101.4 (H) 03/23/2024 1047   MCH 31.8 03/23/2024 1047   MCHC 31.3 03/23/2024 1047   RDW 14.7 03/23/2024 1047   LYMPHSABS 1.3 03/23/2024 1047   MONOABS 0.3 03/23/2024 1047   EOSABS 0.1 03/23/2024 1047   BASOSABS 0.1 03/23/2024 1047    CMP    Component Value Date/Time   NA 139 03/23/2024 1047   K 3.2 (L) 03/23/2024 1047   CL 104 03/23/2024 1047   CO2 25 03/23/2024 1047   GLUCOSE 166 (H) 03/23/2024 1047   BUN 13  03/23/2024 1047   CREATININE 0.95 03/23/2024 1047   CALCIUM 8.7 (L) 03/23/2024 1047   PROT 6.3 (L) 03/23/2024 1047   ALBUMIN 2.9 (L) 03/23/2024 1047   AST 17 03/23/2024 1047   ALT 13 03/23/2024 1047   ALKPHOS 86 03/23/2024 1047   BILITOT 1.0 03/23/2024 1047   GFRNONAA 59 (L) 03/23/2024 1047   GFRAA >60 09/30/2020 1225     No results found for: "CEA1", "CEA" / No results found for: "CEA1", "CEA" No results found for: "PSA1" No results found for: "XNA355" No results found for: "CAN125"  Lab Results  Component Value Date   TOTALPROTELP 6.6 03/09/2024   ALBUMINELP 2.9 03/09/2024   A1GS 0.2 03/09/2024   A2GS 1.0 03/09/2024   BETS 1.4 (H) 03/09/2024   GAMS 1.2 03/09/2024   MSPIKE 0.5 (H) 03/09/2024   SPEI Comment 03/09/2024   Lab Results  Component Value Date   TIBC 137 (L) 05/20/2023   TIBC 117 (L) 03/24/2023   TIBC 107 (L) 01/26/2023   FERRITIN 267 05/20/2023   FERRITIN 257 03/24/2023   FERRITIN 339 (H) 01/26/2023   IRONPCTSAT 33 (H) 05/20/2023   IRONPCTSAT 44 (H) 03/24/2023   IRONPCTSAT 50 (H) 01/26/2023   Lab Results  Component Value Date   LDH 137 05/20/2023   LDH 134 03/24/2023   LDH 125 01/26/2023     STUDIES:   [place] LT BREAST BX W LOC DEV 1ST LESION IMG BX SPEC US GUIDE Addendum Date: 03/28/2024 ADDENDUM REPORT: 03/28/2024 13:03 ADDENDUM: PATHOLOGY revealed: Site 1. Breast, left, needle core biopsy, 9 o'clock retro mass (coil clip) : INVASIVE DUCTAL CARCINOMA, DUCTAL CARCINOMA IN SITU, CRIBRIFORM, INTERMEDIATE NUCLEAR GRADE, WITH NECROSIS OVERALL GRADE: 2. LYMPHOVASCULAR INVASION: PRESENT. CANCER LENGTH: 1.3 CM. CALCIFICATIONS: PRESENT. Pathology results are CONCORDANT with imaging findings, per Dr. Edwin Cap. PATHOLOGY revealed: Site 2. 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 Pathology results are CONCORDANT with imaging findings, per Dr. Edwin Cap. PATHOLOGY revealed: Site 3. Breast, right, needle core biopsy, outer posterior calcs (x clip) : DUCTAL CARCINOMA IN SITU, CRIBRIFORM AND MICROPAPILLARY, INTERMEDIATE NUCLEAR GRADE. NECROSIS: PRESENT, FOCAL. CALCIFICATIONS: PRESENT.  DCIS LENGTH: 1.2 CM. Pathology results are CONCORDANT with imaging findings, per Dr. Edwin Cap. PATHOLOGY revealed: Site 4. Breast, right, needle core biopsy, outer anterior calcs (coil clip) ; INVASIVE DUCTAL CARCINOMA, DUCTAL CARCINOMA IN SITU, CRIBRIFORM AND MICROPAPILLARY, INTERMEDIATE NUCLEAR GRADE, WITH NECROSIS. OVERALL GRADE: 2. LYMPHOVASCULAR INVASION: PRESENT. CANCER LENGTH: 0.3, INVASIVE. 1.3, DCIS. CALCIFICATIONS: PRESENT. Pathology results are CONCORDANT with  imaging findings, per Dr. Edwin Cap. Pathology results and recommendations below were discussed with patient and sister Louie Casa Long) by telephone on 03/27/2024 by Randa Lynn RN. Patient reported biopsy site within normal limits with slight tenderness at the site. Post biopsy care instructions were reviewed, questions were answered and my direct phone number was provided to patient. Patient was instructed to call [facility] if any concerns or questions arise related to the biopsy. RECOMMENDATION: 1. Surgical and oncological consultation. Request for oncological consultation relayed to patient's provider/oncologist (Dr. [name]). Per provider and patient request, surgical consultation will be deferred until patient meets with oncologist to discuss whether surgical intervention will be pursued. Pathology results reported by Randa Lynn RN on 03/28/2024. Electronically Signed   By: Edwin Cap M.D.   On: 03/28/2024 13:03   Result Date: 03/28/2024 CLINICAL DATA:  Patient presents for ultrasound-guided core biopsy of a suspicious palpable 2.2 cm mass in the left breast at 9 o'clock retroareolar as well as ultrasound-guided core biopsy of a 0.7 cm irregular lymph node  in the left axilla. EXAM: ULTRASOUND GUIDED LEFT BREAST CORE NEEDLE BIOPSY COMPARISON:  Previous exam(s). PROCEDURE: I met with the patient and we discussed the procedure of ultrasound-guided biopsy, including benefits and alternatives. We discussed the high likelihood of a successful procedure. We discussed the risks of the procedure, including infection, bleeding, tissue injury, clip migration, and inadequate sampling. Informed written consent was given. The usual time-out protocol was performed immediately prior to the procedure. LEFT BREAST 9 O'CLOCK RETROAREOLAR MASS: Lesion quadrant: UPPER INNER Using sterile technique and 1% Lidocaine as local anesthetic, under direct ultrasound visualization, a 14 gauge spring-loaded device was used to perform biopsy of the 2.2 cm mass in the left breast at 9 o'clock retroareolar using a medial to lateral approach. At the conclusion of the procedure a coil shaped tissue marker clip was deployed into the biopsy cavity. Follow up 2 view mammogram was performed and dictated separately. LEFT AXILLA LYMPH NODE: Lesion quadrant: UPPER OUTER Using sterile technique and 1% Lidocaine as local anesthetic, under direct ultrasound visualization, a 14 gauge spring-loaded device was used to perform biopsy of the abnormal lymph node in the left axilla using a lateral to medial approach. At the conclusion of the procedure a HydroMARK spiral shaped tissue marker clip was deployed into the biopsy cavity. Follow up 2 view mammogram was performed and dictated separately. IMPRESSION: 1. Ultrasound-guided core biopsy of the mass in the left breast at 9 o'clock retroareolar, at site of coil shaped biopsy marking clip. 2. Ultrasound-guided core biopsy of the lymph node in the left axilla, at site of [facility] spiral biopsy marking clip. Electronically Signed: By: Edwin Cap M.D. On: 03/24/2024 11:37   [place] LT BREAST BX W LOC DEV EA ADD LESION IMG BX SPEC US GUIDE Addendum Date:  03/28/2024 ADDENDUM REPORT: 03/28/2024 13:03 ADDENDUM: PATHOLOGY revealed: Site 1. Breast, left, needle core biopsy, 9 o'clock retro mass (coil clip) : INVASIVE DUCTAL CARCINOMA, DUCTAL CARCINOMA IN SITU, CRIBRIFORM, INTERMEDIATE NUCLEAR GRADE, WITH NECROSIS OVERALL GRADE: 2. LYMPHOVASCULAR INVASION: PRESENT. CANCER LENGTH: 1.3 CM. CALCIFICATIONS: PRESENT. Pathology results are CONCORDANT with imaging findings, per Dr. Edwin Cap. PATHOLOGY revealed: Site 2. 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 Pathology results are CONCORDANT with imaging findings, per Dr. Edwin Cap. PATHOLOGY revealed: Site 3. Breast, right, needle core biopsy, outer posterior calcs (x clip) : DUCTAL CARCINOMA IN  SITU, CRIBRIFORM AND MICROPAPILLARY, INTERMEDIATE NUCLEAR GRADE. NECROSIS: PRESENT, FOCAL. CALCIFICATIONS: PRESENT.  DCIS LENGTH: 1.2 CM. Pathology results are CONCORDANT with imaging findings, per Dr. Edwin Cap. PATHOLOGY revealed: Site 4. Breast, right, needle core biopsy, outer anterior calcs (coil clip) ; INVASIVE DUCTAL CARCINOMA, DUCTAL CARCINOMA IN SITU, CRIBRIFORM AND MICROPAPILLARY, INTERMEDIATE NUCLEAR GRADE, WITH NECROSIS. OVERALL GRADE: 2. LYMPHOVASCULAR INVASION: PRESENT. CANCER LENGTH: 0.3, INVASIVE. 1.3, DCIS. CALCIFICATIONS: PRESENT. Pathology results are CONCORDANT with imaging findings, per Dr. Edwin Cap. Pathology results and recommendations below were discussed with patient and sister Louie Casa Long) by telephone on 03/27/2024 by Randa Lynn RN. Patient reported biopsy site within normal limits with slight tenderness at the site. Post biopsy care instructions were reviewed, questions were answered and my direct phone number was provided to patient. Patient was instructed to call [facility] if any concerns or questions arise related to the biopsy. RECOMMENDATION:  1. Surgical and oncological consultation. Request for oncological consultation relayed to patient's provider/oncologist (Dr. [name]). Per provider and patient request, surgical consultation will be deferred until patient meets with oncologist to discuss whether surgical intervention will be pursued. Pathology results reported by Randa Lynn RN on 03/28/2024. Electronically Signed   By: Edwin Cap M.D.   On: 03/28/2024 13:03   Result Date: 03/28/2024 CLINICAL DATA:  Patient presents for ultrasound-guided core biopsy of a suspicious palpable 2.2 cm mass in the left breast at 9 o'clock retroareolar as well as ultrasound-guided core biopsy of a 0.7 cm irregular lymph node in the left axilla. EXAM: ULTRASOUND GUIDED LEFT BREAST CORE NEEDLE BIOPSY COMPARISON:  Previous exam(s). PROCEDURE: I met with the patient and we discussed the procedure of ultrasound-guided biopsy, including benefits and alternatives. We discussed the high likelihood of a successful procedure. We discussed the risks of the procedure, including infection, bleeding, tissue injury, clip migration, and inadequate sampling. Informed written consent was given. The usual time-out protocol was performed immediately prior to the procedure. LEFT BREAST 9 O'CLOCK RETROAREOLAR MASS: Lesion quadrant: UPPER INNER Using sterile technique and 1% Lidocaine as local anesthetic, under direct ultrasound visualization, a 14 gauge spring-loaded device was used to perform biopsy of the 2.2 cm mass in the left breast at 9 o'clock retroareolar using a medial to lateral approach. At the conclusion of the procedure a coil shaped tissue marker clip was deployed into the biopsy cavity. Follow up 2 view mammogram was performed and dictated separately. LEFT AXILLA LYMPH NODE: Lesion quadrant: UPPER OUTER Using sterile technique and 1% Lidocaine as local anesthetic, under direct ultrasound visualization, a 14 gauge spring-loaded device was used to perform biopsy of  the abnormal lymph node in the left axilla using a lateral to medial approach. At the conclusion of the procedure a HydroMARK spiral shaped tissue marker clip was deployed into the biopsy cavity. Follow up 2 view mammogram was performed and dictated separately. IMPRESSION: 1. Ultrasound-guided core biopsy of the mass in the left breast at 9 o'clock retroareolar, at site of coil shaped biopsy marking clip. 2. Ultrasound-guided core biopsy of the lymph node in the left axilla, at site of [facility] spiral biopsy marking clip. Electronically Signed: By: Edwin Cap M.D. On: 03/24/2024 11:37   MM RT BREAST BX W LOC DEV 1ST LESION IMAGE BX SPEC STEREO GUIDE Addendum Date: 03/28/2024 ADDENDUM REPORT: 03/28/2024 12:56 ADDENDUM: PATHOLOGY revealed: Site 1. Breast, left, needle core biopsy, 9 o'clock retro mass (coil clip) : INVASIVE DUCTAL CARCINOMA, DUCTAL CARCINOMA IN SITU, CRIBRIFORM, INTERMEDIATE NUCLEAR GRADE, WITH NECROSIS  OVERALL GRADE: 2. LYMPHOVASCULAR INVASION: PRESENT. CANCER LENGTH: 1.3 CM. CALCIFICATIONS: PRESENT. Pathology results are CONCORDANT with imaging findings, per Dr. Edwin Cap. PATHOLOGY revealed: Site 2. 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 Pathology results are CONCORDANT with imaging findings, per Dr. Edwin Cap. PATHOLOGY revealed: Site 3. Breast, right, needle core biopsy, outer posterior calcs (x clip) : DUCTAL CARCINOMA IN SITU, CRIBRIFORM AND MICROPAPILLARY, INTERMEDIATE NUCLEAR GRADE. NECROSIS: PRESENT, FOCAL. CALCIFICATIONS: PRESENT.  DCIS LENGTH: 1.2 CM. Pathology results are CONCORDANT with imaging findings, per Dr. Edwin Cap. PATHOLOGY revealed: Site 4. Breast, right, needle core biopsy, outer anterior calcs (coil clip) ; INVASIVE DUCTAL CARCINOMA, DUCTAL CARCINOMA IN SITU, CRIBRIFORM AND MICROPAPILLARY, INTERMEDIATE NUCLEAR GRADE, WITH NECROSIS.  OVERALL GRADE: 2. LYMPHOVASCULAR INVASION: PRESENT. CANCER LENGTH: 0.3, INVASIVE. 1.3, DCIS. CALCIFICATIONS: PRESENT. Pathology results are CONCORDANT with imaging findings, per Dr. Edwin Cap. Pathology results and recommendations below were discussed with patient and sister Louie Casa Long) by telephone on 03/27/2024 by Randa Lynn RN. Patient reported biopsy site within normal limits with slight tenderness at the site. Post biopsy care instructions were reviewed, questions were answered and my direct phone number was provided to patient. Patient was instructed to call [facility] if any concerns or questions arise related to the biopsy. RECOMMENDATION: 1. Surgical and oncological consultation. Request for oncological consultation relayed to patient's provider/oncologist (Dr. [name]). Per provider and patient request, surgical consultation will be deferred until patient meets with oncologist to discuss whether surgical intervention will be pursued. Pathology results reported by Randa Lynn RN on 03/28/2024. Electronically Signed   By: Edwin Cap M.D.   On: 03/28/2024 12:56   Result Date: 03/28/2024 CLINICAL DATA:  Patient presents for stereotactic guided core biopsy of the anterior and posterior extent of calcifications in the outer right breast spanning 8.6 cm. EXAM: RIGHT BREAST STEREOTACTIC CORE NEEDLE BIOPSY COMPARISON:  Previous exam(s). FINDINGS: The patient and I discussed the procedure of stereotactic-guided biopsy including benefits and alternatives. We discussed the high likelihood of a successful procedure. We discussed the risks of the procedure including infection, bleeding, tissue injury, clip migration, and inadequate sampling. Informed written consent was given. The usual time out protocol was performed immediately prior to the procedure. SITE 1: RIGHT BREAST OUTER POSTERIOR CALCIFICATIONS: Using sterile technique and 1% Lidocaine as local anesthetic, under  stereotactic guidance, a 9 gauge vacuum assisted device was used to perform core needle biopsy of the calcifications in the outer posterior right breast using a lateral to medial approach. Specimen radiograph was performed showing the presence of calcifications. Specimens with calcifications are identified for pathology. Lesion quadrant: Upper outer At the conclusion of the procedure, an X shaped tissue marker clip was deployed into the biopsy cavity. Follow-up 2-view mammogram was performed and dictated separately. SITE 2: RIGHT BREAST OUTER ANTERIOR CALCIFICATIONS: Using sterile technique and 1% Lidocaine as local anesthetic, under stereotactic guidance, a 9 gauge vacuum assisted device was used to perform core needle biopsy of the calcifications in the outer anterior right breast was performed using a lateral to medial approach. Specimen radiograph was performed showing the presence of calcifications. Specimens with calcifications are identified for pathology. Lesion quadrant: Upper outer At the conclusion of the procedure, a coil shaped tissue marker clip was deployed into the biopsy cavity. Follow-up 2-view mammogram was performed and dictated separately. IMPRESSION: 1. Stereotactic guided core biopsy of calcifications in the outer posterior right breast, at site  of X shaped biopsy marking clip. 2. Stereotactic guided core biopsy of calcifications in the outer anterior right breast, at site of coil shaped biopsy marking clip. Electronically Signed: By: Edwin Cap M.D. On: 03/24/2024 11:55   MM RT BREAST BX W LOC DEV EA AD LESION IMG BX SPEC STEREO GUIDE Addendum Date: 03/28/2024 ADDENDUM REPORT: 03/28/2024 12:56 ADDENDUM: PATHOLOGY revealed: Site 1. Breast, left, needle core biopsy, 9 o'clock retro mass (coil clip) : INVASIVE DUCTAL CARCINOMA, DUCTAL CARCINOMA IN SITU, CRIBRIFORM, INTERMEDIATE NUCLEAR GRADE, WITH NECROSIS OVERALL GRADE: 2. LYMPHOVASCULAR INVASION: PRESENT. CANCER LENGTH: 1.3 CM.  CALCIFICATIONS: PRESENT. Pathology results are CONCORDANT with imaging findings, per Dr. Edwin Cap. PATHOLOGY revealed: Site 2. 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 Pathology results are CONCORDANT with imaging findings, per Dr. Edwin Cap. PATHOLOGY revealed: Site 3. Breast, right, needle core biopsy, outer posterior calcs (x clip) : DUCTAL CARCINOMA IN SITU, CRIBRIFORM AND MICROPAPILLARY, INTERMEDIATE NUCLEAR GRADE. NECROSIS: PRESENT, FOCAL. CALCIFICATIONS: PRESENT.  DCIS LENGTH: 1.2 CM. Pathology results are CONCORDANT with imaging findings, per Dr. Edwin Cap. PATHOLOGY revealed: Site 4. Breast, right, needle core biopsy, outer anterior calcs (coil clip) ; INVASIVE DUCTAL CARCINOMA, DUCTAL CARCINOMA IN SITU, CRIBRIFORM AND MICROPAPILLARY, INTERMEDIATE NUCLEAR GRADE, WITH NECROSIS. OVERALL GRADE: 2. LYMPHOVASCULAR INVASION: PRESENT. CANCER LENGTH: 0.3, INVASIVE. 1.3, DCIS. CALCIFICATIONS: PRESENT. Pathology results are CONCORDANT with imaging findings, per Dr. Edwin Cap. Pathology results and recommendations below were discussed with patient and sister Louie Casa Long) by telephone on 03/27/2024 by Randa Lynn RN. Patient reported biopsy site within normal limits with slight tenderness at the site. Post biopsy care instructions were reviewed, questions were answered and my direct phone number was provided to patient. Patient was instructed to call [facility] if any concerns or questions arise related to the biopsy. RECOMMENDATION: 1. Surgical and oncological consultation. Request for oncological consultation relayed to patient's provider/oncologist (Dr. [name]). Per provider and patient request, surgical consultation will be deferred until patient meets with oncologist to discuss whether surgical intervention will be pursued. Pathology results  reported by Randa Lynn RN on 03/28/2024. Electronically Signed   By: Edwin Cap M.D.   On: 03/28/2024 12:56   Result Date: 03/28/2024 CLINICAL DATA:  Patient presents for stereotactic guided core biopsy of the anterior and posterior extent of calcifications in the outer right breast spanning 8.6 cm. EXAM: RIGHT BREAST STEREOTACTIC CORE NEEDLE BIOPSY COMPARISON:  Previous exam(s). FINDINGS: The patient and I discussed the procedure of stereotactic-guided biopsy including benefits and alternatives. We discussed the high likelihood of a successful procedure. We discussed the risks of the procedure including infection, bleeding, tissue injury, clip migration, and inadequate sampling. Informed written consent was given. The usual time out protocol was performed immediately prior to the procedure. SITE 1: RIGHT BREAST OUTER POSTERIOR CALCIFICATIONS: Using sterile technique and 1% Lidocaine as local anesthetic, under stereotactic guidance, a 9 gauge vacuum assisted device was used to perform core needle biopsy of the calcifications in the outer posterior right breast using a lateral to medial approach. Specimen radiograph was performed showing the presence of calcifications. Specimens with calcifications are identified for pathology. Lesion quadrant: Upper outer At the conclusion of the procedure, an X shaped tissue marker clip was deployed into the biopsy cavity. Follow-up 2-view mammogram was performed and dictated separately. SITE 2: RIGHT BREAST OUTER ANTERIOR CALCIFICATIONS: Using sterile technique and 1% Lidocaine as local anesthetic, under stereotactic guidance, a  9 gauge vacuum assisted device was used to perform core needle biopsy of the calcifications in the outer anterior right breast was performed using a lateral to medial approach. Specimen radiograph was performed showing the presence of calcifications. Specimens with calcifications are identified for pathology. Lesion quadrant: Upper outer At the  conclusion of the procedure, a coil shaped tissue marker clip was deployed into the biopsy cavity. Follow-up 2-view mammogram was performed and dictated separately. IMPRESSION: 1. Stereotactic guided core biopsy of calcifications in the outer posterior right breast, at site of X shaped biopsy marking clip. 2. Stereotactic guided core biopsy of calcifications in the outer anterior right breast, at site of coil shaped biopsy marking clip. Electronically Signed: By: Edwin Cap M.D. On: 03/24/2024 11:55   MM CLIP PLACEMENT RIGHT Result Date: 03/24/2024 CLINICAL DATA:  Post ultrasound-guided core biopsy of a mass in the left breast at 9 o'clock retroareolar and ultrasound-guided core biopsy of an abnormal lymph node in the left axilla. Post stereotactic guided core biopsy of calcifications in the outer posterior right breast and stereotactic guided core biopsy of calcifications in the outer anterior right breast. EXAM: 3D DIAGNOSTIC BILATERAL MAMMOGRAM POST ULTRASOUND AND STEREOTACTIC GUIDED BIOPSIES COMPARISON:  Previous exam(s). ACR Breast Density Category c: The breasts are heterogeneously dense, which may obscure small masses. FINDINGS: 3D Mammographic images were obtained following ultrasound-guided core biopsy of a mass in the left breast at 9 o'clock retroareolar, ultrasound-guided core biopsy of an abnormal lymph node in the left axilla, stereotactic guided core biopsy of calcifications in the outer posterior right breast and stereotactic guided core biopsy of calcifications in the outer anterior right breast. An X shaped biopsy marking clip is present at the site of the biopsied calcifications in the outer posterior right breast. A coil shaped biopsy marking clip is present at the site of the biopsied calcifications in the outer anterior right breast. A coil shaped biopsy marking clip is present at the site of the biopsied mass in the left breast at 9 o'clock retroareolar. The spiral HydroMARK biopsy  marking clip at site of biopsied lymph node in the left axilla could not be included in the field of view. IMPRESSION: 1. X shaped biopsy marking clip at site of biopsied calcifications in the outer posterior right breast. 2. Coil shaped biopsy marking clip at site of biopsied calcifications in the outer anterior right breast. 3. Coil shaped biopsy marking clip at site of biopsied mass in the left breast at 9 o'clock retroareolar. 4. The spiral HydroMARK biopsy marking clip at site of biopsied lymph node in the left axilla could not be included in the field of view. Final Assessment: Post Procedure Mammograms for Marker Placement Electronically Signed   By: Edwin Cap M.D.   On: 03/24/2024 12:00   MM CLIP PLACEMENT LEFT Result Date: 03/24/2024 CLINICAL DATA:  Post ultrasound-guided core biopsy of a mass in the left breast at 9 o'clock retroareolar and ultrasound-guided core biopsy of an abnormal lymph node in the left axilla. Post stereotactic guided core biopsy of calcifications in the outer posterior right breast and stereotactic guided core biopsy of calcifications in the outer anterior right breast. EXAM: 3D DIAGNOSTIC BILATERAL MAMMOGRAM POST ULTRASOUND AND STEREOTACTIC GUIDED BIOPSIES COMPARISON:  Previous exam(s). ACR Breast Density Category c: The breasts are heterogeneously dense, which may obscure small masses. FINDINGS: 3D Mammographic images were obtained following ultrasound-guided core biopsy of a mass in the left breast at 9 o'clock retroareolar, ultrasound-guided core biopsy of an abnormal lymph  node in the left axilla, stereotactic guided core biopsy of calcifications in the outer posterior right breast and stereotactic guided core biopsy of calcifications in the outer anterior right breast. An X shaped biopsy marking clip is present at the site of the biopsied calcifications in the outer posterior right breast. A coil shaped biopsy marking clip is present at the site of the biopsied  calcifications in the outer anterior right breast. A coil shaped biopsy marking clip is present at the site of the biopsied mass in the left breast at 9 o'clock retroareolar. The spiral HydroMARK biopsy marking clip at site of biopsied lymph node in the left axilla could not be included in the field of view. IMPRESSION: 1. X shaped biopsy marking clip at site of biopsied calcifications in the outer posterior right breast. 2. Coil shaped biopsy marking clip at site of biopsied calcifications in the outer anterior right breast. 3. Coil shaped biopsy marking clip at site of biopsied mass in the left breast at 9 o'clock retroareolar. 4. The spiral HydroMARK biopsy marking clip at site of biopsied lymph node in the left axilla could not be included in the field of view. Final Assessment: Post Procedure Mammograms for Marker Placement Electronically Signed   By: Edwin Cap M.D.   On: 03/24/2024 12:00   MM 3D DIAGNOSTIC MAMMOGRAM BILATERAL BREAST Result Date: 03/09/2024 CLINICAL DATA:  83 year old female with recent PET scan for multiple myeloma with findings demonstrating radiotracer uptake in the periareolar left breast. EXAM: DIGITAL DIAGNOSTIC BILATERAL MAMMOGRAM WITH TOMOSYNTHESIS AND CAD; ULTRASOUND LEFT BREAST LIMITED TECHNIQUE: Bilateral digital diagnostic mammography and breast tomosynthesis was performed. The images were evaluated with computer-aided detection. ; Targeted ultrasound examination of the left breast was performed. COMPARISON:  Previous exam(s), with most recent mammography performed in 2018. ACR Breast Density Category c: The breasts are heterogeneously dense, which may obscure small masses. FINDINGS: Spot compression magnification views of the outer right breast demonstrate new segmental linear oriented pleomorphic type calcifications spanning 8.6 cm. There is flattening of the left nipple with a new ill-defined mass in the slightly inner retroareolar left breast measuring 1.6 cm. Physical  examination reveals a hard mass involving the retroareolar to slightly inner left breast. Targeted ultrasound of the left breast was performed. There is an irregular hypoechoic mass in the left breast at 9 o'clock retroareolar measuring 2.2 x 1 x 1.6 cm. This corresponds the mass seen in the left breast at mammography. There is an irregular mass in the left axilla likely representing an abnormal lymph node measuring 0.6 x 0.5 x 0.7 cm. Postsurgical changes are present slightly inferior medial to the small mass in the left axilla. IMPRESSION: 1. Suspicious palpable 2.2 cm mass in the left breast at 9 o'clock retroareolar. 2.  Suspicious 0.7 cm mass/probable lymph node in the left axilla. 3. Suspicious segmental calcifications involving the outer right breast spanning 8.6 cm. RECOMMENDATION: 1. Recommend ultrasound-guided core biopsy of the mass in the left breast at 9 o'clock retroareolar. 2. Recommend ultrasound-guided core biopsy of the mass/probable lymph node in the left axilla. 3. Recommend 2 site stereotactic guided core biopsy of the calcifications in the outer right breast, to include the most anterior and most posterior extents of these calcifications. The patient and her sister were both informed of the findings and recommendations. They prefer to have these biopsies all occur on the same day and therefore she will be scheduled at the breast Center of [facility] for the ultrasound-guided core biopsies of the left  breast as well as stereotactic guided biopsies of the right breast. I have discussed the findings and recommendations with the patient. If applicable, a reminder letter will be sent to the patient regarding the next appointment. BI-RADS CATEGORY  5: Highly suggestive of malignancy. Electronically Signed   By: Edwin Cap M.D.   On: 03/09/2024 15:47   [place] LIMITED ULTRASOUND INCLUDING AXILLA LEFT BREAST  Result Date: 03/09/2024 CLINICAL DATA:  83 year old female with recent PET scan for  multiple myeloma with findings demonstrating radiotracer uptake in the periareolar left breast. EXAM: DIGITAL DIAGNOSTIC BILATERAL MAMMOGRAM WITH TOMOSYNTHESIS AND CAD; ULTRASOUND LEFT BREAST LIMITED TECHNIQUE: Bilateral digital diagnostic mammography and breast tomosynthesis was performed. The images were evaluated with computer-aided detection. ; Targeted ultrasound examination of the left breast was performed. COMPARISON:  Previous exam(s), with most recent mammography performed in 2018. ACR Breast Density Category c: The breasts are heterogeneously dense, which may obscure small masses. FINDINGS: Spot compression magnification views of the outer right breast demonstrate new segmental linear oriented pleomorphic type calcifications spanning 8.6 cm. There is flattening of the left nipple with a new ill-defined mass in the slightly inner retroareolar left breast measuring 1.6 cm. Physical examination reveals a hard mass involving the retroareolar to slightly inner left breast. Targeted ultrasound of the left breast was performed. There is an irregular hypoechoic mass in the left breast at 9 o'clock retroareolar measuring 2.2 x 1 x 1.6 cm. This corresponds the mass seen in the left breast at mammography. There is an irregular mass in the left axilla likely representing an abnormal lymph node measuring 0.6 x 0.5 x 0.7 cm. Postsurgical changes are present slightly inferior medial to the small mass in the left axilla. IMPRESSION: 1. Suspicious palpable 2.2 cm mass in the left breast at 9 o'clock retroareolar. 2.  Suspicious 0.7 cm mass/probable lymph node in the left axilla. 3. Suspicious segmental calcifications involving the outer right breast spanning 8.6 cm. RECOMMENDATION: 1. Recommend ultrasound-guided core biopsy of the mass in the left breast at 9 o'clock retroareolar. 2. Recommend ultrasound-guided core biopsy of the mass/probable lymph node in the left axilla. 3. Recommend 2 site stereotactic guided core  biopsy of the calcifications in the outer right breast, to include the most anterior and most posterior extents of these calcifications. The patient and her sister were both informed of the findings and recommendations. They prefer to have these biopsies all occur on the same day and therefore she will be scheduled at the breast Center of [facility] for the ultrasound-guided core biopsies of the left breast as well as stereotactic guided biopsies of the right breast. I have discussed the findings and recommendations with the patient. If applicable, a reminder letter will be sent to the patient regarding the next appointment. BI-RADS CATEGORY  5: Highly suggestive of malignancy. Electronically Signed   By: Edwin Cap M.D.   On: 03/09/2024 15:47

## 2024-03-31 ENCOUNTER — Encounter: Payer: Self-pay | Admitting: Hematology

## 2024-03-31 ENCOUNTER — Encounter (HOSPITAL_COMMUNITY): Payer: Self-pay | Admitting: Hematology

## 2024-04-04 ENCOUNTER — Telehealth: Payer: Self-pay | Admitting: *Deleted

## 2024-04-04 ENCOUNTER — Other Ambulatory Visit: Payer: Self-pay

## 2024-04-04 NOTE — Telephone Encounter (Signed)
 Received call from patient's sister, Brittany Archer to advise she has had no injury. Woke up this morning and is experiencing pain in both shoulders. No restriction in motion, however said it is very painful when she tries to do anything.  Per Dr. Ellin Saba, pain is from starting Anastrozole and will get better with time.  Recommended Tylenol for mild pain and Advil or Ibuprofen for moderate to severe pain.  Verbalized understanding.

## 2024-04-06 ENCOUNTER — Inpatient Hospital Stay

## 2024-04-06 ENCOUNTER — Inpatient Hospital Stay: Payer: Medicare HMO

## 2024-04-06 VITALS — BP 163/69 | HR 67 | Temp 97.4°F | Resp 18 | Wt 154.6 lb

## 2024-04-06 DIAGNOSIS — Z5112 Encounter for antineoplastic immunotherapy: Secondary | ICD-10-CM | POA: Diagnosis not present

## 2024-04-06 DIAGNOSIS — E876 Hypokalemia: Secondary | ICD-10-CM

## 2024-04-06 DIAGNOSIS — C9000 Multiple myeloma not having achieved remission: Secondary | ICD-10-CM

## 2024-04-06 DIAGNOSIS — E538 Deficiency of other specified B group vitamins: Secondary | ICD-10-CM

## 2024-04-06 DIAGNOSIS — D0511 Intraductal carcinoma in situ of right breast: Secondary | ICD-10-CM

## 2024-04-06 DIAGNOSIS — Z170 Estrogen receptor positive status [ER+]: Secondary | ICD-10-CM

## 2024-04-06 DIAGNOSIS — D649 Anemia, unspecified: Secondary | ICD-10-CM

## 2024-04-06 LAB — CBC WITH DIFFERENTIAL/PLATELET
Abs Immature Granulocytes: 0.02 10*3/uL (ref 0.00–0.07)
Basophils Absolute: 0.1 10*3/uL (ref 0.0–0.1)
Basophils Relative: 1 %
Eosinophils Absolute: 0.1 10*3/uL (ref 0.0–0.5)
Eosinophils Relative: 2 %
HCT: 29.9 % — ABNORMAL LOW (ref 36.0–46.0)
Hemoglobin: 9.6 g/dL — ABNORMAL LOW (ref 12.0–15.0)
Immature Granulocytes: 0 %
Lymphocytes Relative: 25 %
Lymphs Abs: 1.2 10*3/uL (ref 0.7–4.0)
MCH: 32.4 pg (ref 26.0–34.0)
MCHC: 32.1 g/dL (ref 30.0–36.0)
MCV: 101 fL — ABNORMAL HIGH (ref 80.0–100.0)
Monocytes Absolute: 0.4 10*3/uL (ref 0.1–1.0)
Monocytes Relative: 9 %
Neutro Abs: 3 10*3/uL (ref 1.7–7.7)
Neutrophils Relative %: 63 %
Platelets: 239 10*3/uL (ref 150–400)
RBC: 2.96 MIL/uL — ABNORMAL LOW (ref 3.87–5.11)
RDW: 15.9 % — ABNORMAL HIGH (ref 11.5–15.5)
WBC: 4.8 10*3/uL (ref 4.0–10.5)
nRBC: 0 % (ref 0.0–0.2)

## 2024-04-06 LAB — COMPREHENSIVE METABOLIC PANEL WITH GFR
ALT: 11 U/L (ref 0–44)
AST: 13 U/L — ABNORMAL LOW (ref 15–41)
Albumin: 2.8 g/dL — ABNORMAL LOW (ref 3.5–5.0)
Alkaline Phosphatase: 102 U/L (ref 38–126)
Anion gap: 9 (ref 5–15)
BUN: 12 mg/dL (ref 8–23)
CO2: 27 mmol/L (ref 22–32)
Calcium: 9.3 mg/dL (ref 8.9–10.3)
Chloride: 103 mmol/L (ref 98–111)
Creatinine, Ser: 0.79 mg/dL (ref 0.44–1.00)
GFR, Estimated: >60 mL/min (ref >60)
Glucose, Bld: 181 mg/dL — ABNORMAL HIGH (ref 70–99)
Potassium: 3.4 mmol/L — ABNORMAL LOW (ref 3.5–5.1)
Sodium: 139 mmol/L (ref 135–145)
Total Bilirubin: 0.8 mg/dL (ref 0.0–1.2)
Total Protein: 7.2 g/dL (ref 6.5–8.1)

## 2024-04-06 LAB — MAGNESIUM: Magnesium: 1.7 mg/dL (ref 1.7–2.4)

## 2024-04-06 MED ORDER — PROCHLORPERAZINE MALEATE 10 MG PO TABS
10.0000 mg | ORAL_TABLET | Freq: Once | ORAL | Status: AC
  Administered 2024-04-06: 10 mg via ORAL
  Filled 2024-04-06: qty 1

## 2024-04-06 MED ORDER — BORTEZOMIB CHEMO SQ INJECTION 3.5 MG (2.5MG/ML)
1.3000 mg/m2 | Freq: Once | INTRAMUSCULAR | Status: AC
  Administered 2024-04-06: 2.25 mg via SUBCUTANEOUS
  Filled 2024-04-06: qty 0.9

## 2024-04-06 MED ORDER — DEXAMETHASONE 4 MG PO TABS
20.0000 mg | ORAL_TABLET | Freq: Once | ORAL | Status: AC
Start: 2024-04-06 — End: 2024-04-06
  Administered 2024-04-06: 20 mg via ORAL
  Filled 2024-04-06: qty 5

## 2024-04-06 MED ORDER — POTASSIUM CHLORIDE CRYS ER 20 MEQ PO TBCR
40.0000 meq | EXTENDED_RELEASE_TABLET | Freq: Once | ORAL | Status: AC
  Administered 2024-04-06: 40 meq via ORAL
  Filled 2024-04-06: qty 2

## 2024-04-06 MED ORDER — EPOETIN ALFA-EPBX 10000 UNIT/ML IJ SOLN
20000.0000 [IU] | Freq: Once | INTRAMUSCULAR | Status: AC
  Administered 2024-04-06: 20000 [IU] via SUBCUTANEOUS
  Filled 2024-04-06: qty 2

## 2024-04-06 NOTE — Patient Instructions (Signed)
 [facility] [place] [facility]  Discharge Instructions: Thank you for choosing [facility] to provide your oncology and hematology care.  If you have a lab appointment with the Cancer Center - please note that after April 8th, 2024, all labs will be drawn in the cancer center.  You do not have to check in or register with the main entrance as you have in the past but will complete your check-in in the cancer center.  Wear comfortable clothing and clothing appropriate for easy access to any Portacath or PICC line.   We strive to give you quality time with your provider. You may need to reschedule your appointment if you arrive late (15 or more minutes).  Arriving late affects you and other patients whose appointments are after yours.  Also, if you miss three or more appointments without notifying the office, you may be dismissed from the clinic at the provider's discretion.      For prescription refill requests, have your pharmacy contact our office and allow 72 hours for refills to be completed.    Today you received the following chemotherapy and/or immunotherapy agents velcade     To help prevent nausea and vomiting after your treatment, we encourage you to take your nausea medication as directed.  BELOW ARE SYMPTOMS THAT SHOULD BE REPORTED IMMEDIATELY: *FEVER GREATER THAN 100.4 F (38 C) OR HIGHER *CHILLS OR SWEATING *NAUSEA AND VOMITING THAT IS NOT CONTROLLED WITH YOUR NAUSEA MEDICATION *UNUSUAL SHORTNESS OF BREATH *UNUSUAL BRUISING OR BLEEDING *URINARY PROBLEMS (pain or burning when urinating, or frequent urination) *BOWEL PROBLEMS (unusual diarrhea, constipation, pain near the anus) TENDERNESS IN MOUTH AND THROAT WITH OR WITHOUT PRESENCE OF ULCERS (sore throat, sores in mouth, or a toothache) UNUSUAL RASH, SWELLING OR PAIN  UNUSUAL VAGINAL DISCHARGE OR ITCHING   Items with * indicate a potential emergency and should be followed up as  soon as possible or go to the Emergency Department if any problems should occur.  Please show the CHEMOTHERAPY ALERT CARD or IMMUNOTHERAPY ALERT CARD at check-in to the Emergency Department and triage nurse.  Should you have questions after your visit or need to cancel or reschedule your appointment, please contact [facility] [place] - A DEPT OF [facility] [phone]  and follow the prompts.  Office hours are 8:00 a.m. to 4:30 p.m. Monday - Friday. Please note that voicemails left after 4:00 p.m. may not be returned until the following business day.  We are closed weekends and major holidays. You have access to a nurse at all times for urgent questions. Please call the main number to the clinic [phone] and follow the prompts.  For any non-urgent questions, you may also contact your provider using MyChart. We now offer e-Visits for anyone 62 and older to request care online for non-urgent symptoms. For details visit [email or web address].   Also download the MyChart app! Go to the app store, search "MyChart", open the app, select [place], and log in with your MyChart username and password.

## 2024-04-06 NOTE — Progress Notes (Signed)
 Patient tolerated Velcade injection with no complaints voiced.  Lab work reviewed.  See MAR for details.  Injection site clean and dry with no bruising or swelling noted.  Patient stable during and after injection.  Band aid applied.  VSS.  Patient left in satisfactory condition with no s/s of distress noted.

## 2024-04-06 NOTE — Progress Notes (Unsigned)
 Patient stated having shoulder discomfort with muscle and joint aches.  Recently started Anastrozole and Revlimid.   Reviewed with oncologist with verbal order patient may take NSAIDS once a day and use Tylenol for relief.  Reviewed with caregiver with understanding verbalized.   No other complaints voiced.

## 2024-04-17 ENCOUNTER — Other Ambulatory Visit: Payer: Self-pay | Admitting: Hematology

## 2024-04-17 DIAGNOSIS — C9000 Multiple myeloma not having achieved remission: Secondary | ICD-10-CM

## 2024-04-18 ENCOUNTER — Other Ambulatory Visit: Payer: Self-pay | Admitting: Hematology

## 2024-04-18 DIAGNOSIS — C9000 Multiple myeloma not having achieved remission: Secondary | ICD-10-CM

## 2024-04-18 NOTE — Telephone Encounter (Signed)
 Chart reviewed. Revlimid refilled per last office note with Dr. Ellin Saba.

## 2024-04-20 ENCOUNTER — Inpatient Hospital Stay

## 2024-04-20 ENCOUNTER — Encounter: Payer: Self-pay | Admitting: Hematology

## 2024-04-20 ENCOUNTER — Ambulatory Visit (HOSPITAL_COMMUNITY)
Admission: RE | Admit: 2024-04-20 | Discharge: 2024-04-20 | Disposition: A | Discharge status: Home / Self Care | Source: Ambulatory Visit | Attending: Hematology | Admitting: Hematology

## 2024-04-20 VITALS — BP 152/67 | HR 78 | Temp 98.9°F | Resp 18 | Wt 155.3 lb

## 2024-04-20 DIAGNOSIS — Z79899 Other long term (current) drug therapy: Secondary | ICD-10-CM | POA: Insufficient documentation

## 2024-04-20 DIAGNOSIS — Z780 Asymptomatic menopausal state: Secondary | ICD-10-CM | POA: Diagnosis not present

## 2024-04-20 DIAGNOSIS — C9000 Multiple myeloma not having achieved remission: Secondary | ICD-10-CM

## 2024-04-20 DIAGNOSIS — Z13820 Encounter for screening for osteoporosis: Secondary | ICD-10-CM | POA: Diagnosis not present

## 2024-04-20 DIAGNOSIS — M8589 Other specified disorders of bone density and structure, multiple sites: Secondary | ICD-10-CM | POA: Diagnosis not present

## 2024-04-20 DIAGNOSIS — Z1501 Genetic susceptibility to malignant neoplasm of breast: Secondary | ICD-10-CM

## 2024-04-20 DIAGNOSIS — Z5112 Encounter for antineoplastic immunotherapy: Secondary | ICD-10-CM | POA: Diagnosis not present

## 2024-04-20 DIAGNOSIS — D649 Anemia, unspecified: Secondary | ICD-10-CM

## 2024-04-20 LAB — CBC WITH DIFFERENTIAL/PLATELET
Abs Immature Granulocytes: 0.02 10*3/uL (ref 0.00–0.07)
Basophils Absolute: 0 10*3/uL (ref 0.0–0.1)
Basophils Relative: 1 %
Eosinophils Absolute: 0.1 10*3/uL (ref 0.0–0.5)
Eosinophils Relative: 3 %
HCT: 33.2 % — ABNORMAL LOW (ref 36.0–46.0)
Hemoglobin: 10.5 g/dL — ABNORMAL LOW (ref 12.0–15.0)
Immature Granulocytes: 1 %
Lymphocytes Relative: 22 %
Lymphs Abs: 1 10*3/uL (ref 0.7–4.0)
MCH: 32.6 pg (ref 26.0–34.0)
MCHC: 31.6 g/dL (ref 30.0–36.0)
MCV: 103.1 fL — ABNORMAL HIGH (ref 80.0–100.0)
Monocytes Absolute: 0.4 10*3/uL (ref 0.1–1.0)
Monocytes Relative: 9 %
Neutro Abs: 2.9 10*3/uL (ref 1.7–7.7)
Neutrophils Relative %: 64 %
Platelets: 161 10*3/uL (ref 150–400)
RBC: 3.22 MIL/uL — ABNORMAL LOW (ref 3.87–5.11)
RDW: 17.1 % — ABNORMAL HIGH (ref 11.5–15.5)
WBC: 4.4 10*3/uL (ref 4.0–10.5)
nRBC: 0 % (ref 0.0–0.2)

## 2024-04-20 LAB — IRON AND TIBC
Iron: 28 ug/dL (ref 28–170)
Saturation Ratios: 23 % (ref 10.4–31.8)
TIBC: 120 ug/dL — ABNORMAL LOW (ref 250–450)
UIBC: 92 ug/dL

## 2024-04-20 LAB — COMPREHENSIVE METABOLIC PANEL WITH GFR
ALT: 12 U/L (ref 0–44)
AST: 12 U/L — ABNORMAL LOW (ref 15–41)
Albumin: 3 g/dL — ABNORMAL LOW (ref 3.5–5.0)
Alkaline Phosphatase: 98 U/L (ref 38–126)
Anion gap: 11 (ref 5–15)
BUN: 12 mg/dL (ref 8–23)
CO2: 25 mmol/L (ref 22–32)
Calcium: 9.3 mg/dL (ref 8.9–10.3)
Chloride: 102 mmol/L (ref 98–111)
Creatinine, Ser: 1.07 mg/dL — ABNORMAL HIGH (ref 0.44–1.00)
GFR, Estimated: 51 mL/min — ABNORMAL LOW (ref >60)
Glucose, Bld: 185 mg/dL — ABNORMAL HIGH (ref 70–99)
Potassium: 3.7 mmol/L (ref 3.5–5.1)
Sodium: 138 mmol/L (ref 135–145)
Total Bilirubin: 1.2 mg/dL (ref 0.0–1.2)
Total Protein: 6.6 g/dL (ref 6.5–8.1)

## 2024-04-20 LAB — GENETIC SCREENING ORDER

## 2024-04-20 LAB — MAGNESIUM: Magnesium: 1.7 mg/dL (ref 1.7–2.4)

## 2024-04-20 LAB — FERRITIN: Ferritin: 306 ng/mL (ref 11–307)

## 2024-04-20 MED ORDER — BORTEZOMIB CHEMO SQ INJECTION 3.5 MG (2.5MG/ML)
1.3000 mg/m2 | Freq: Once | INTRAMUSCULAR | Status: AC
  Administered 2024-04-20: 2.25 mg via SUBCUTANEOUS
  Filled 2024-04-20: qty 0.9

## 2024-04-20 MED ORDER — PROCHLORPERAZINE MALEATE 10 MG PO TABS
10.0000 mg | ORAL_TABLET | Freq: Once | ORAL | Status: AC
  Administered 2024-04-20: 10 mg via ORAL
  Filled 2024-04-20: qty 1

## 2024-04-20 MED ORDER — DEXAMETHASONE 4 MG PO TABS
20.0000 mg | ORAL_TABLET | Freq: Once | ORAL | Status: AC
  Administered 2024-04-20: 20 mg via ORAL
  Filled 2024-04-20: qty 5

## 2024-04-20 NOTE — Progress Notes (Signed)
Patient presents today for Velcade injection per providers order.  Vital signs and labs within parameters for treatment.  Patient has no new complaints at this time.  Stable during administration without incident; injection site WNL; see MAR for injection details.  Patient tolerated procedure well and without incident.  No questions or complaints noted at this time.

## 2024-04-20 NOTE — Patient Instructions (Signed)
 [facility] [place] - [facility] OF [facility]  Discharge Instructions: Thank you for choosing [facility] to provide your oncology and hematology care.  If you have a lab appointment with the Cancer Center - please note that after April 8th, 2024, all labs will be drawn in the cancer center.  You do not have to check in or register with the main entrance as you have in the past but will complete your check-in in the cancer center.  Wear comfortable clothing and clothing appropriate for easy access to any Portacath or PICC line.   We strive to give you quality time with your provider. You may need to reschedule your appointment if you arrive late (15 or more minutes).  Arriving late affects you and other patients whose appointments are after yours.  Also, if you miss three or more appointments without notifying the office, you may be dismissed from the clinic at the provider's discretion.      For prescription refill requests, have your pharmacy contact our office and allow 72 hours for refills to be completed.    Today you received the following chemotherapy and/or immunotherapy agents Velcade      To help prevent nausea and vomiting after your treatment, we encourage you to take your nausea medication as directed.  BELOW ARE SYMPTOMS THAT SHOULD BE REPORTED IMMEDIATELY: *FEVER GREATER THAN 100.4 F (38 C) OR HIGHER *CHILLS OR SWEATING *NAUSEA AND VOMITING THAT IS NOT CONTROLLED WITH YOUR NAUSEA MEDICATION *UNUSUAL SHORTNESS OF BREATH *UNUSUAL BRUISING OR BLEEDING *URINARY PROBLEMS (pain or burning when urinating, or frequent urination) *BOWEL PROBLEMS (unusual diarrhea, constipation, pain near the anus) TENDERNESS IN MOUTH AND THROAT WITH OR WITHOUT PRESENCE OF ULCERS (sore throat, sores in mouth, or a toothache) UNUSUAL RASH, SWELLING OR PAIN  UNUSUAL VAGINAL DISCHARGE OR ITCHING   Items with * indicate a potential emergency and should be followed up  as soon as possible or go to the Emergency Department if any problems should occur.  Please show the CHEMOTHERAPY ALERT CARD or IMMUNOTHERAPY ALERT CARD at check-in to the Emergency Department and triage nurse.  Should you have questions after your visit or need to cancel or reschedule your appointment, please contact [facility] [place] - A DEPT OF Eligha Bridegroom [facility] [phone]  and follow the prompts.  Office hours are 8:00 a.m. to 4:30 p.m. Monday - Friday. Please note that voicemails left after 4:00 p.m. may not be returned until the following business day.  We are closed weekends and major holidays. You have access to a nurse at all times for urgent questions. Please call the main number to the clinic [phone] and follow the prompts.  For any non-urgent questions, you may also contact your provider using MyChart. We now offer e-Visits for anyone 44 and older to request care online for non-urgent symptoms. For details visit [email or web address].   Also download the MyChart app! Go to the app store, search "MyChart", open the app, select [place], and log in with your MyChart username and password.

## 2024-05-01 ENCOUNTER — Other Ambulatory Visit: Payer: Self-pay | Admitting: Hematology

## 2024-05-04 ENCOUNTER — Inpatient Hospital Stay: Attending: Hematology

## 2024-05-04 ENCOUNTER — Inpatient Hospital Stay: Admitting: Licensed Clinical Social Worker

## 2024-05-04 ENCOUNTER — Other Ambulatory Visit: Payer: Self-pay

## 2024-05-04 ENCOUNTER — Encounter: Payer: Self-pay | Admitting: Licensed Clinical Social Worker

## 2024-05-04 ENCOUNTER — Inpatient Hospital Stay: Admitting: Hematology

## 2024-05-04 ENCOUNTER — Inpatient Hospital Stay

## 2024-05-04 ENCOUNTER — Encounter: Payer: Self-pay | Admitting: Hematology

## 2024-05-04 VITALS — BP 172/68 | HR 64 | Temp 98.2°F | Resp 18 | Wt 155.0 lb

## 2024-05-04 DIAGNOSIS — C50212 Malignant neoplasm of upper-inner quadrant of left female breast: Secondary | ICD-10-CM

## 2024-05-04 DIAGNOSIS — N182 Chronic kidney disease, stage 2 (mild): Secondary | ICD-10-CM | POA: Insufficient documentation

## 2024-05-04 DIAGNOSIS — C9000 Multiple myeloma not having achieved remission: Secondary | ICD-10-CM | POA: Insufficient documentation

## 2024-05-04 DIAGNOSIS — Z5112 Encounter for antineoplastic immunotherapy: Secondary | ICD-10-CM | POA: Diagnosis present

## 2024-05-04 DIAGNOSIS — Z170 Estrogen receptor positive status [ER+]: Secondary | ICD-10-CM

## 2024-05-04 DIAGNOSIS — Z79899 Other long term (current) drug therapy: Secondary | ICD-10-CM | POA: Diagnosis not present

## 2024-05-04 DIAGNOSIS — Z803 Family history of malignant neoplasm of breast: Secondary | ICD-10-CM

## 2024-05-04 DIAGNOSIS — Z1379 Encounter for other screening for genetic and chromosomal anomalies: Secondary | ICD-10-CM | POA: Insufficient documentation

## 2024-05-04 DIAGNOSIS — D631 Anemia in chronic kidney disease: Secondary | ICD-10-CM | POA: Insufficient documentation

## 2024-05-04 LAB — CBC WITH DIFFERENTIAL/PLATELET
Abs Immature Granulocytes: 0.02 10*3/uL (ref 0.00–0.07)
Basophils Absolute: 0.1 10*3/uL (ref 0.0–0.1)
Basophils Relative: 1 %
Eosinophils Absolute: 0.1 10*3/uL (ref 0.0–0.5)
Eosinophils Relative: 1 %
HCT: 32.7 % — ABNORMAL LOW (ref 36.0–46.0)
Hemoglobin: 10.4 g/dL — ABNORMAL LOW (ref 12.0–15.0)
Immature Granulocytes: 0 %
Lymphocytes Relative: 33 %
Lymphs Abs: 1.9 10*3/uL (ref 0.7–4.0)
MCH: 32.8 pg (ref 26.0–34.0)
MCHC: 31.8 g/dL (ref 30.0–36.0)
MCV: 103.2 fL — ABNORMAL HIGH (ref 80.0–100.0)
Monocytes Absolute: 0.4 10*3/uL (ref 0.1–1.0)
Monocytes Relative: 7 %
Neutro Abs: 3.2 10*3/uL (ref 1.7–7.7)
Neutrophils Relative %: 58 %
Platelets: 262 10*3/uL (ref 150–400)
RBC: 3.17 MIL/uL — ABNORMAL LOW (ref 3.87–5.11)
RDW: 16.1 % — ABNORMAL HIGH (ref 11.5–15.5)
WBC: 5.6 10*3/uL (ref 4.0–10.5)
nRBC: 0 % (ref 0.0–0.2)

## 2024-05-04 LAB — COMPREHENSIVE METABOLIC PANEL WITH GFR
ALT: 11 U/L (ref 0–44)
AST: 17 U/L (ref 15–41)
Albumin: 3 g/dL — ABNORMAL LOW (ref 3.5–5.0)
Alkaline Phosphatase: 106 U/L (ref 38–126)
Anion gap: 8 (ref 5–15)
BUN: 13 mg/dL (ref 8–23)
CO2: 23 mmol/L (ref 22–32)
Calcium: 9.5 mg/dL (ref 8.9–10.3)
Chloride: 109 mmol/L (ref 98–111)
Creatinine, Ser: 1 mg/dL (ref 0.44–1.00)
GFR, Estimated: 56 mL/min — ABNORMAL LOW (ref >60)
Glucose, Bld: 133 mg/dL — ABNORMAL HIGH (ref 70–99)
Potassium: 3.7 mmol/L (ref 3.5–5.1)
Sodium: 140 mmol/L (ref 135–145)
Total Bilirubin: 0.5 mg/dL (ref 0.0–1.2)
Total Protein: 7 g/dL (ref 6.5–8.1)

## 2024-05-04 LAB — MAGNESIUM: Magnesium: 1.8 mg/dL (ref 1.7–2.4)

## 2024-05-04 LAB — GENETIC SCREENING ORDER

## 2024-05-04 MED ORDER — DEXAMETHASONE 4 MG PO TABS
20.0000 mg | ORAL_TABLET | Freq: Once | ORAL | Status: AC
  Administered 2024-05-04: 20 mg via ORAL
  Filled 2024-05-04: qty 5

## 2024-05-04 MED ORDER — PROCHLORPERAZINE MALEATE 10 MG PO TABS
10.0000 mg | ORAL_TABLET | Freq: Once | ORAL | Status: AC
  Administered 2024-05-04: 10 mg via ORAL
  Filled 2024-05-04: qty 1

## 2024-05-04 MED ORDER — BORTEZOMIB CHEMO SQ INJECTION 3.5 MG (2.5MG/ML)
1.3000 mg/m2 | Freq: Once | INTRAMUSCULAR | Status: AC
  Administered 2024-05-04: 2.25 mg via SUBCUTANEOUS
  Filled 2024-05-04: qty 0.9

## 2024-05-04 NOTE — Patient Instructions (Signed)
 [facility] [place] - A DEPT OF [facility]  Discharge Instructions: Thank you for choosing [facility] to provide your oncology and hematology care.  If you have a lab appointment with the Cancer Center - please note that after April 8th, 2024, all labs will be drawn in the cancer center.  You do not have to check in or register with the main entrance as you have in the past but will complete your check-in in the cancer center.  Wear comfortable clothing and clothing appropriate for easy access to any Portacath or PICC line.   We strive to give you quality time with your provider. You may need to reschedule your appointment if you arrive late (15 or more minutes).  Arriving late affects you and other patients whose appointments are after yours.  Also, if you miss three or more appointments without notifying the office, you may be dismissed from the clinic at the provider's discretion.      For prescription refill requests, have your pharmacy contact our office and allow 72 hours for refills to be completed.    Today you received the following chemotherapy and/or immunotherapy agents Velcade, return as scheduled.   To help prevent nausea and vomiting after your treatment, we encourage you to take your nausea medication as directed.  BELOW ARE SYMPTOMS THAT SHOULD BE REPORTED IMMEDIATELY: *FEVER GREATER THAN 100.4 F (38 C) OR HIGHER *CHILLS OR SWEATING *NAUSEA AND VOMITING THAT IS NOT CONTROLLED WITH YOUR NAUSEA MEDICATION *UNUSUAL SHORTNESS OF BREATH *UNUSUAL BRUISING OR BLEEDING *URINARY PROBLEMS (pain or burning when urinating, or frequent urination) *BOWEL PROBLEMS (unusual diarrhea, constipation, pain near the anus) TENDERNESS IN MOUTH AND THROAT WITH OR WITHOUT PRESENCE OF ULCERS (sore throat, sores in mouth, or a toothache) UNUSUAL RASH, SWELLING OR PAIN  UNUSUAL VAGINAL DISCHARGE OR ITCHING   Items with * indicate a potential emergency and  should be followed up as soon as possible or go to the Emergency Department if any problems should occur.  Please show the CHEMOTHERAPY ALERT CARD or IMMUNOTHERAPY ALERT CARD at check-in to the Emergency Department and triage nurse.  Should you have questions after your visit or need to cancel or reschedule your appointment, please contact [facility] [place] - A DEPT OF [facility] [phone]  and follow the prompts.  Office hours are 8:00 a.m. to 4:30 p.m. Monday - Friday. Please note that voicemails left after 4:00 p.m. may not be returned until the following business day.  We are closed weekends and major holidays. You have access to a nurse at all times for urgent questions. Please call the main number to the clinic [phone] and follow the prompts.  For any non-urgent questions, you may also contact your provider using MyChart. We now offer e-Visits for anyone 68 and older to request care online for non-urgent symptoms. For details visit [email or web address].   Also download the MyChart app! Go to the app store, search "MyChart", open the app, select [place], and log in with your MyChart username and password.

## 2024-05-04 NOTE — Progress Notes (Signed)
 REFERRING PROVIDER: Paulett Boros, MD [address] [place],  [place] [address]  PRIMARY PROVIDER:  Twylla Galen, MD  PRIMARY REASON FOR VISIT:  1. Genetic testing   2. Malignant neoplasm of upper-inner quadrant of left breast in female, estrogen receptor positive (HCC)   3. Malignant neoplasm of overlapping sites of right breast in female, estrogen receptor positive (HCC)   4. Multiple myeloma not having achieved remission (HCC)   5. Family history of breast cancer    I connected with Brittany Archer and her niece on 05/04/2024 at 10:50 AM EDT by MyChart video conference and verified that I am speaking with the correct person using two identifiers.    Patient location: [facility] Provider location: [facility]   HISTORY OF PRESENT ILLNESS:   Brittany Archer, a 83 y.o. female, was seen for a [facility] cancer genetics consultation at the request of Dr. Cheree Cords due to a personal and family history of cancer.  Brittany Archer presents to clinic today to discuss the possibility of a hereditary predisposition to cancer, genetic testing, and to further clarify her future cancer risks, as well as potential cancer risks for family members.    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 She was diagnosed with multiple myeloma at 31. 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  RISK FACTORS:  Menarche was at age 35.  First live birth at age teens.  Hysterectomy: yes.  Menopausal status: postmenopausal.  HRT use: 0  years.  Past Medical History:  Diagnosis Date   Breast cancer (HCC)    left breast/ 2008/ surg/ rad tx   Coronary artery disease    Diabetes mellitus     Past Surgical History:  Procedure Laterality Date   ABDOMINAL HYSTERECTOMY     BREAST BIOPSY Left 03/24/2024   US  LT BREAST BX W LOC DEV 1ST LESION IMG BX SPEC US  GUIDE 03/24/2024 [facility]   BREAST BIOPSY Left 03/24/2024   US  LT BREAST BX W LOC DEV EA ADD LESION IMG BX SPEC US  GUIDE 03/24/2024 [facility]   BREAST BIOPSY Right 03/24/2024   MM RT BREAST BX W LOC DEV EA AD LESION IMG BX SPEC STEREO GUIDE 03/24/2024 [facility]   BREAST BIOPSY Right 03/24/2024   MM RT BREAST BX W LOC DEV 1ST LESION IMAGE BX SPEC STEREO GUIDE 03/24/2024 [facility]   BREAST SURGERY     DEBRIDEMENT MANDIBLE N/A 02/22/2020   Procedure: INCISION AND DRAINAGE WITH DEBRIDEMENT MANDIBLE;  Surgeon: Joseph Nickel, DMD;  Location: [facility];  Service: Oral Surgery;  Laterality: N/A;   DEBRIDEMENT MANDIBLE Right 03/19/2021   Procedure: DEBRIDEMENT OF BONE RIGHT INTERIOR  MANDIBLE;  Surgeon: Joseph Nickel, DMD;  Location: [facility];  Service: Oral Surgery;  Laterality: Right;   EYE SURGERY  2021   cataract removals    TOOTH EXTRACTION N/A 02/22/2020   Procedure: DENTAL RESTORATION/EXTRACTIONS;  Surgeon: Joseph Nickel, DMD;  Location: [facility];  Service: Oral  Surgery;  Laterality: N/A;  DENTAL KIT REQUESTED    FAMILY HISTORY:  We obtained a detailed, 4-generation family history.  Significant diagnoses are listed below: Family History  Problem Relation Age of Onset   Obesity Sister    Breast cancer Sister        dx 59s   Breast cancer Sister    Kidney cancer Brother     Brittany Archer had 1 son, he passed recently in his sleep in his 67s. Patient had 4 sisters and 3 brothers. One brother passed of kidney cancer. A sister passed of breast cancer over age 93. Another sister currently has berast cancer in her 17s. A nephew passed of brain cancer. No  other known cancers in the family.  Brittany Archer is unaware of previous family history of genetic testing for hereditary cancer risks. There is no reported Ashkenazi Jewish ancestry. There is no known consanguinity.    GENETIC COUNSELING ASSESSMENT: Brittany Archer is a 83 y.o. female with a personal and family history of breast cancer which is somewhat suggestive of a hereditary cancer syndrome and predisposition to cancer. We, therefore, discussed and recommended the following at today's visit.   DISCUSSION: We discussed that approximately 10% of breast cancer is hereditary. Most cases of hereditary breast cancer are associated with BRCA1/2 genes, although there are other genes associated with hereditary cancer as well. Cancers and risks are gene specific. We discussed that testing is beneficial for several reasons including knowing about cancer risks, identifying potential screening and risk-reduction options that may be appropriate, and to understand if other family members could be at risk for cancer and allow them to undergo genetic testing.   We reviewed the characteristics, features and inheritance patterns of hereditary cancer syndromes. We also discussed genetic testing, including the appropriate family members to test, the process of testing, insurance coverage and turn-around-time for results. We discussed the implications of a negative, positive and/or variant of uncertain significant result. We recommended Brittany Archer pursue genetic testing for the [employer] Common Hereditary Cancers+RNA gene panel.   Based on Brittany Archer's personal and family history of cancer, she meets medical criteria for genetic testing. Despite that she meets criteria, she may still have an out of pocket cost.   PLAN: After considering the risks, benefits, and limitations, Brittany Archer did not wish to pursue genetic testing at today's visit. We understand this decision and remain available to coordinate genetic testing  at any time in the future. We, therefore, recommend Brittany Archer continue to follow the cancer screening guidelines given by her primary healthcare provider.   Brittany Archer's questions were answered to her satisfaction today. Our contact information was provided should additional questions or concerns arise. Thank you for the referral and allowing us  to share in the care of your patient.   Valri Gee, MS, [facility] Genetic Counselor [email or web address] Phone: [phone]  35 minutes were spent on the date of the encounter in service to the patient including preparation, face-to-face consultation, documentation and care coordination. Dr. Nelson Bandy was available for discussion regarding this case.   _______________________________________________________________________ For Office Staff:  Number of people involved in session: 2 Was an Intern/ student involved with case: no

## 2024-05-04 NOTE — Progress Notes (Signed)
 Patient tolerated Velcade injection with no complaints voiced.  Lab work reviewed.  See MAR for details.  Injection site clean and dry with no bruising or swelling noted.  Patient stable during and after injection.  Band aid applied.  VSS.  Patient left in satisfactory condition with no s/s of distress noted.

## 2024-05-09 ENCOUNTER — Other Ambulatory Visit: Payer: Self-pay | Admitting: Hematology

## 2024-05-09 DIAGNOSIS — C9000 Multiple myeloma not having achieved remission: Secondary | ICD-10-CM

## 2024-05-11 ENCOUNTER — Inpatient Hospital Stay (HOSPITAL_BASED_OUTPATIENT_CLINIC_OR_DEPARTMENT_OTHER): Admitting: Hematology

## 2024-05-11 ENCOUNTER — Encounter: Payer: Self-pay | Admitting: Hematology

## 2024-05-11 VITALS — BP 133/55 | HR 80 | Temp 98.0°F | Resp 20 | Wt 155.0 lb

## 2024-05-11 DIAGNOSIS — C9000 Multiple myeloma not having achieved remission: Secondary | ICD-10-CM | POA: Diagnosis not present

## 2024-05-11 DIAGNOSIS — Z5112 Encounter for antineoplastic immunotherapy: Secondary | ICD-10-CM | POA: Diagnosis not present

## 2024-05-11 NOTE — Progress Notes (Signed)
 [facility] [address], [place] [address]   Clinic Day:  05/11/2024  Referring physician: Twylla Galen, MD  Patient Care Team: Brittany Galen, MD as PCP - General (Internal Medicine) Brittany Boros, MD as Medical Oncologist (Hematology)   ASSESSMENT & PLAN:   Assessment:  1.  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  In the left axilla there is mild tracer avid lymph node measuring 5 mm with SUV 2.3.  In the left breast there is a tracer avid soft tissue nodule within the periareolar region measuring 1.9 cm SUV 6.1.  No tracer avid mediastinal or hilar lymph nodes.  No bone lesions. - Diagnostic mammogram/ultrasound (03/10/2023): Suspicious palpable 2.2 cm mass in the left breast at 9:00 retroareolar.  Suspicious 0.7 cm mass/node in the left axilla. - Left breast 9:00 retroareolar mass biopsy (03/24/2021): IDC, grade 2, ER 95% strong, PR 60% strong, HER2 (0), Ki-67 15% - Left axillary lymph node biopsy: Metastatic carcinoma  2.  Right breast cancer, ER/PR+ G2 HER2 (1+): - PET scan done for multiple myeloma showed incidental breast mass. - Mammogram/ultrasound (03/09/2024): New segmental linear oriented pleomorphic type calcifications spanning 8.6 cm in the outer right breast. - Right breast biopsy outer posterior calcifications (03/16/2024): DCIS, grade 2. - Right breast biopsy outer anterior calcifications (03/16/2024): IDC, grade 2, ER 100% strong, PR 60% strong, HER2 (1+), Ki-67 10% - Ideally we would approach this with lumpectomy/mastectomy and lymph node biopsy bilaterally.  However because of recent worsening of multiple myeloma, definitive breast cancer management was put on back burner.  3.  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  Social/family history: - She lives at home with her sister and sister's husband.  She is independent of ADLs and some IADLs.  Her 2 children are deceased. - Sister had breast and colon cancer.  Mother had cancer, type unknown to the patient.  Brother died of kidney cancer recently.  Another sister had breast cancer.   Plan:  1.  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 - She was started on anastrozole  which she is tolerating it very well. - We discussed bone density results from 04/20/2024: Osteopenia.  Will continue monitoring.  Continue anastrozole  daily at this time.  2.  IgA kappa multiple myeloma, standard risk: - As her myeloma gotten recently worse, Revlimid  was added back on 01/11/2024.  She is continuing Velcade  every 2 weeks and tolerating it well. - Last myeloma labs on 03/09/2024: M spike stable at 0.5 g.  FLC ratio has improved to 2.89 with improvement in kappa light chains to 113.8. - Continue Revlimid  10 mg 3 weeks on/1 week off and Velcade  every 2 weeks.  She is tolerating it very well without any side effects. - RTC 6 weeks for follow-up with repeat multiple myeloma labs 2 weeks prior.  3.  Severe hypokalemia/hypomagnesemia: - Continue potassium 3 times daily and magnesium  3 times daily.  Both are normal.  4.  Osteomyelitis of the right mandible/dental abscess: - Denosumab  on hold indefinitely.  5.  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  Hemoglobin is  10.4.  Ferritin was 304 and percent saturation 23.   Orders Placed This Encounter  Procedures   Magnesium     Standing Status:   Future    Expected Date:   06/01/2024    Expiration Date:   06/01/2025   CBC with Differential    Standing Status:   Future    Expected Date:   06/01/2024    Expiration Date:   06/02/2025    Comprehensive metabolic panel    Standing Status:   Future    Expected Date:   06/01/2024    Expiration Date:   06/02/2025   Magnesium     Standing Status:   Future    Expected Date:   06/15/2024    Expiration Date:   06/15/2025   CBC with Differential    Standing Status:   Future    Expected Date:   06/15/2024    Expiration Date:   06/16/2025   Comprehensive metabolic panel    Standing Status:   Future    Expected Date:   06/15/2024    Expiration Date:   06/16/2025   Magnesium     Standing Status:   Future    Expected Date:   06/29/2024    Expiration Date:   06/29/2025   CBC with Differential    Standing Status:   Future    Expected Date:   06/29/2024    Expiration Date:   06/30/2025   Comprehensive metabolic panel    Standing Status:   Future    Expected Date:   06/29/2024    Expiration Date:   06/30/2025   Magnesium     Standing Status:   Future    Expected Date:   07/13/2024    Expiration Date:   07/13/2025   CBC with Differential    Standing Status:   Future    Expected Date:   07/13/2024    Expiration Date:   07/14/2025   Comprehensive metabolic panel    Standing Status:   Future    Expected Date:   07/13/2024    Expiration Date:   07/14/2025   Magnesium     Standing Status:   Future    Expected Date:   07/27/2024    Expiration Date:   07/27/2025   CBC with Differential    Standing Status:   Future    Expected Date:   07/27/2024    Expiration Date:   07/28/2025   Comprehensive metabolic panel    Standing Status:   Future    Expected Date:   07/27/2024    Expiration Date:   07/28/2025   Magnesium     Standing Status:   Future    Expected Date:   08/10/2024    Expiration Date:   08/10/2025   CBC with Differential    Standing Status:   Future    Expected Date:   08/10/2024    Expiration Date:   08/11/2025   Comprehensive metabolic panel    Standing Status:   Future    Expected Date:   08/10/2024    Expiration Date:   08/11/2025     [name],acting as a scribe for Brittany Boros,  MD.,have documented all relevant documentation on the behalf of Brittany Boros, MD,as directed by  Brittany Boros, MD while in the presence of Brittany Boros, MD.  I, Brittany Boros MD, have reviewed the above documentation for accuracy and completeness, and I agree with the above.    Brittany Boros, MD   5/15/20254:22 PM  CHIEF  COMPLAINT/PURPOSE OF CONSULT:   Diagnosis: Bilateral ER positive breast cancer, multiple myeloma   Cancer Staging  Breast cancer of upper-inner quadrant of left female breast [facility]) Staging form: Breast, AJCC 8th Edition - Clinical stage from 03/30/2024: Stage IIA (cT2, cN1, cM0, G2, ER+, PR+, HER2-) - Unsigned  Breast cancer, right (HCC) Staging form: Breast, AJCC 8th Edition - Clinical stage from 03/30/2024: Stage IIA (cT3, cN0, cM0, G2, ER+, PR+, HER2-) - Unsigned    Prior Therapy: None  Current Therapy: Anastrozole    HISTORY OF PRESENT ILLNESS:   Oncology History  Multiple myeloma not having achieved remission (HCC)  01/20/2018 Initial Diagnosis   Multiple myeloma not having achieved remission (HCC)   01/26/2018 - 09/01/2022 Chemotherapy   Patient is on Treatment Plan : MYELOMA  RVD SQ (Bortezomib  d 1,8,15 ) q28d x 4 cycles     04/13/2022 -  Chemotherapy   Patient is on Treatment Plan : MYELOMA MAINTENANCE Bortezomib  SQ q14d         Brittany Archer is a 83 y.o. female presenting to clinic today for evaluation of newly diagnosed bilateral breast cancer at the request of breast center.  She was found to have incidental breast mass on PET scan for myeloma.  This was followed by diagnostic mammogram and ultrasound on 03/09/2024 and biopsies of both breasts on 03/24/2024.  She is here to discuss results and further plan with her sister.  She attained menarche around age 89.  She had TAH in her 82s.  She had 2 children who are deceased now.  Age at first childbirth was not clear but in her teens.  Never received hormone replacement therapy or  birth control pills.  She had previous history of left breast DCIS, underwent lumpectomy in August 2008 and radiation therapy.  Today, she states that she is doing well overall. Her appetite level is at 60%. Her energy level is at 20%.  INTERVAL HISTORY:   Brittany Archer is a 83 y.o. female presenting to the clinic today for follow-up of bilateral breast cancer and multiple myeloma. She was last seen by me on 03/30/24 in consultation.  Since her last visit, she underwent a bone density scan on 04/20/24 with a T-score of -1.0 which is considered osteopenic.   Today, she states that she is doing well overall. Her appetite level is at 100%. Her energy level is at 100%.   PAST MEDICAL HISTORY:   Past Medical History: Past Medical History:  Diagnosis Date   Breast cancer (HCC)    left breast/ 2008/ surg/ rad tx   Coronary artery disease    Diabetes mellitus     Surgical History: Past Surgical History:  Procedure Laterality Date   ABDOMINAL HYSTERECTOMY     BREAST BIOPSY Left 03/24/2024   US  LT BREAST BX W LOC DEV 1ST LESION IMG BX SPEC US  GUIDE 03/24/2024 [facility]   BREAST BIOPSY Left 03/24/2024   US  LT BREAST BX W LOC DEV EA ADD LESION IMG BX SPEC US  GUIDE 03/24/2024 [facility]   BREAST BIOPSY Right 03/24/2024   MM RT BREAST BX W LOC DEV EA AD LESION IMG BX SPEC STEREO GUIDE 03/24/2024 [facility]   BREAST BIOPSY Right 03/24/2024   MM RT BREAST BX W LOC DEV 1ST LESION IMAGE BX SPEC STEREO GUIDE 03/24/2024 [facility]   BREAST SURGERY     DEBRIDEMENT MANDIBLE N/A 02/22/2020   Procedure: INCISION AND DRAINAGE WITH DEBRIDEMENT MANDIBLE;  Surgeon: Joseph Nickel, DMD;  Location: [facility];  Service: Oral Surgery;  Laterality: N/A;   DEBRIDEMENT MANDIBLE Right 03/19/2021   Procedure: DEBRIDEMENT OF BONE RIGHT INTERIOR  MANDIBLE;  Surgeon: Joseph Nickel, DMD;  Location: [facility];  Service: Oral Surgery;  Laterality: Right;   EYE SURGERY  2021   cataract removals    TOOTH  EXTRACTION N/A 02/22/2020   Procedure: DENTAL RESTORATION/EXTRACTIONS;  Surgeon: Joseph Nickel, DMD;  Location: [facility];  Service: Oral Surgery;  Laterality: N/A;  DENTAL KIT REQUESTED    Social History: Social History   Socioeconomic History   Marital status: Divorced    Spouse name: Not on file   Number of children: Not on file   Years of education: Not on file   Highest education level: Not on file  Occupational History   Not on file  Tobacco Use   Smoking status: Never   Smokeless tobacco: Never  Vaping Use   Vaping status: Never Used  Substance and Sexual Activity   Alcohol use: No   Drug use: No   Sexual activity: Yes    Birth control/protection: Surgical  Other Topics Concern   Not on file  Social History Narrative   Not on file   Social Drivers of Health   Financial Resource Strain: Low Risk  (12/09/2020)   Overall Financial Resource Strain (CARDIA)    Difficulty of Paying Living Expenses: Not hard at all  Food Insecurity: No Food Insecurity (12/09/2020)   Hunger Vital Sign    Worried About Running Out of Food in the Last Year: Never true    Ran Out of Food in the Last Year: Never true  Transportation Needs: No Transportation Needs (12/09/2020)   PRAPARE - Administrator, Civil Service (Medical): No    Lack of Transportation (Non-Medical): No  Physical Activity: Inactive (12/09/2020)   Exercise Vital Sign    Days of Exercise per Week: 0 days    Minutes of Exercise per Session: 0 min  Stress: No Stress Concern Present (12/09/2020)   [employer] of Occupational Health - Occupational Stress Questionnaire    Feeling of Stress : Not at all  Social Connections: Moderately Isolated (12/09/2020)   Social Connection and Isolation Panel [NHANES]    Frequency of Communication with Friends and Family: More than three times a week    Frequency of Social Gatherings with Friends and Family: More than three times a week    Attends Religious Services: More  than 4 times per year    Active Member of [employer] or Organizations: No    Attends Banker Meetings: Never    Marital Status: Divorced  Catering manager Violence: Not At Risk (12/09/2020)   Humiliation, Afraid, Rape, and Kick questionnaire    Fear of Current or Ex-Partner: No    Emotionally Abused: No    Physically Abused: No    Sexually Abused: No    Family History: Family History  Problem Relation Age of Onset   Obesity Sister    Breast cancer Sister        dx 33s   Breast cancer Sister    Kidney cancer Brother     Current Medications:  Current Outpatient Medications:    acyclovir  (ZOVIRAX ) 400 MG tablet, Take 400 mg by mouth 2 (two) times daily., Disp: , Rfl:    anastrozole  (ARIMIDEX ) 1 MG tablet, Take 1 tablet (1 mg total) by mouth daily., Disp: 30 tablet, Rfl: 6   aspirin  81 MG tablet, Take 1 tablet (81 mg total) by mouth  daily with breakfast., Disp: 30 tablet, Rfl: 5   atorvastatin (LIPITOR) 10 MG tablet, Take 10 mg by mouth daily., Disp: , Rfl:    bortezomib  IV (VELCADE ) 3.5 MG injection, 3.5 mg once a week. weekly, Disp: , Rfl:    chlorhexidine  (PERIDEX ) 0.12 % solution, Use as directed 15 mLs in the mouth or throat 3 (three) times daily., Disp: , Rfl:    cholecalciferol (VITAMIN D) 25 MCG (1000 UNIT) tablet, 1 capsule, Disp: , Rfl:    famotidine  (PEPCID ) 20 MG tablet, Take 20 mg by mouth at bedtime as needed., Disp: , Rfl:    glucose blood (ACCU-CHEK AVIVA PLUS) test strip, CHECK BLOOD SUGAR ONCE DAILY, Disp: , Rfl:    GNP ASPIRIN  LOW DOSE 81 MG tablet, Take 81 mg by mouth daily., Disp: , Rfl:    lenalidomide  (REVLIMID ) 10 MG capsule, TAKE 1 CAPSULE BY MOUTH EVERY DAY FOR 21 DAYS ON , 7 DAYS OFF, Disp: 21 capsule, Rfl: 0   magnesium  oxide (MAG-OX) 400 (240 Mg) MG tablet, TAKE ONE TABLET BY MOUTH THREE TIMES A DAY, Disp: 90 tablet, Rfl: 3   metFORMIN  (GLUCOPHAGE -XR) 500 MG 24 hr tablet, SMARTSIG:1 Tablet(s) By Mouth Every Evening, Disp: , Rfl:    mirtazapine   (REMERON ) 15 MG tablet, Take 0.5 tablets (7.5 mg total) by mouth at bedtime. For appetite stimulation, Disp: 30 tablet, Rfl: 2   ondansetron  (ZOFRAN ) 8 MG tablet, , Disp: , Rfl:    Potassium Acetate  POWD, Take 20 mEq by mouth daily., Disp: 12000 g, Rfl: 5   potassium chloride  (KLOR-CON ) 20 MEQ packet, MIX AND TAKE ONE PACKET DAILY., Disp: 30 packet, Rfl: 3   potassium chloride  SA (KLOR-CON  M) 20 MEQ tablet, Take by mouth., Disp: , Rfl:    Allergies: Allergies  Allergen Reactions   Other Other (See Comments)   Seasonal Ic [Cholestatin] Other (See Comments)    Sneezing, watery eyes   Motrin [Ibuprofen] Rash    REVIEW OF SYSTEMS:   Review of Systems  Constitutional:  Negative for chills, fatigue and fever.  HENT:   Negative for lump/mass, mouth sores, nosebleeds, sore throat and trouble swallowing.   Eyes:  Negative for eye problems.  Respiratory:  Negative for cough and shortness of breath.   Cardiovascular:  Negative for chest pain, leg swelling and palpitations.  Gastrointestinal:  Negative for abdominal pain, constipation, diarrhea, nausea and vomiting.  Genitourinary:  Negative for bladder incontinence, difficulty urinating, dysuria, frequency, hematuria and nocturia.   Musculoskeletal:  Negative for arthralgias, back pain, flank pain, myalgias and neck pain.  Skin:  Negative for itching and rash.  Neurological:  Negative for dizziness, headaches and numbness.  Hematological:  Does not bruise/bleed easily.  Psychiatric/Behavioral:  Negative for depression, sleep disturbance and suicidal ideas. The patient is not nervous/anxious.   All other systems reviewed and are negative.    VITALS:   Blood pressure (!) 133/55, pulse 80, temperature 98 F (36.7 C), temperature source Oral, resp. rate 20, weight 154 lb 15.7 oz (70.3 kg), SpO2 99%.  Wt Readings from Last 3 Encounters:  05/11/24 154 lb 15.7 oz (70.3 kg)  05/04/24 155 lb (70.3 kg)  04/20/24 155 lb 4.8 oz (70.4 kg)    Body  mass index is 28.34 kg/m.  Performance status (ECOG): 1 - Symptomatic but completely ambulatory  PHYSICAL EXAM:   Physical Exam Vitals and nursing note reviewed. Exam conducted with a chaperone present.  Constitutional:      Appearance: Normal appearance.  Cardiovascular:  Rate and Rhythm: Normal rate and regular rhythm.     Pulses: Normal pulses.     Heart sounds: Normal heart sounds.  Pulmonary:     Effort: Pulmonary effort is normal.     Breath sounds: Normal breath sounds.  Abdominal:     Palpations: Abdomen is soft. There is no hepatomegaly, splenomegaly or mass.     Tenderness: There is no abdominal tenderness.  Musculoskeletal:     Right lower leg: No edema.     Left lower leg: No edema.  Lymphadenopathy:     Cervical: No cervical adenopathy.     Right cervical: No superficial, deep or posterior cervical adenopathy.    Left cervical: No superficial, deep or posterior cervical adenopathy.     Upper Body:     Right upper body: No supraclavicular or axillary adenopathy.     Left upper body: No supraclavicular or axillary adenopathy.  Neurological:     General: No focal deficit present.     Mental Status: She is alert and oriented to person, place, and time.  Psychiatric:        Mood and Affect: Mood normal.        Behavior: Behavior normal.     LABS:   CBC    Component Value Date/Time   WBC 5.6 05/04/2024 1045   RBC 3.17 (L) 05/04/2024 1045   HGB 10.4 (L) 05/04/2024 1045   HCT 32.7 (L) 05/04/2024 1045   PLT 262 05/04/2024 1045   MCV 103.2 (H) 05/04/2024 1045   MCH 32.8 05/04/2024 1045   MCHC 31.8 05/04/2024 1045   RDW 16.1 (H) 05/04/2024 1045   LYMPHSABS 1.9 05/04/2024 1045   MONOABS 0.4 05/04/2024 1045   EOSABS 0.1 05/04/2024 1045   BASOSABS 0.1 05/04/2024 1045    CMP    Component Value Date/Time   NA 140 05/04/2024 1045   K 3.7 05/04/2024 1045   CL 109 05/04/2024 1045   CO2 23 05/04/2024 1045   GLUCOSE 133 (H) 05/04/2024 1045   BUN 13  05/04/2024 1045   CREATININE 1.00 05/04/2024 1045   CALCIUM 9.5 05/04/2024 1045   PROT 7.0 05/04/2024 1045   ALBUMIN 3.0 (L) 05/04/2024 1045   AST 17 05/04/2024 1045   ALT 11 05/04/2024 1045   ALKPHOS 106 05/04/2024 1045   BILITOT 0.5 05/04/2024 1045   GFRNONAA 56 (L) 05/04/2024 1045   GFRAA >60 09/30/2020 1225     No results found for: "CEA1", "CEA" / No results found for: "CEA1", "CEA" No results found for: "PSA1" No results found for: "CAN199" No results found for: "CAN125"  Lab Results  Component Value Date   TOTALPROTELP 6.6 03/09/2024   ALBUMINELP 2.9 03/09/2024   A1GS 0.2 03/09/2024   A2GS 1.0 03/09/2024   BETS 1.4 (H) 03/09/2024   GAMS 1.2 03/09/2024   MSPIKE 0.5 (H) 03/09/2024   SPEI Comment 03/09/2024   Lab Results  Component Value Date   TIBC 120 (L) 04/20/2024   TIBC 137 (L) 05/20/2023   TIBC 117 (L) 03/24/2023   FERRITIN 306 04/20/2024   FERRITIN 267 05/20/2023   FERRITIN 257 03/24/2023   IRONPCTSAT 23 04/20/2024   IRONPCTSAT 33 (H) 05/20/2023   IRONPCTSAT 44 (H) 03/24/2023   Lab Results  Component Value Date   LDH 137 05/20/2023   LDH 134 03/24/2023   LDH 125 01/26/2023     STUDIES:   DG Bone Density Result Date: 04/20/2024 EXAM: DUAL X-RAY ABSORPTIOMETRY (DXA) FOR BONE MINERAL DENSITY 04/20/2024 12:48  pm CLINICAL DATA:  83 year old Female Postmenopausal. Screening for osteoporosis TECHNIQUE: An axial (e.g., hips, spine) and/or appendicular (e.g., radius) exam was performed, as appropriate, using GE Psychologist, sport and exercise at [facility]. Images are obtained for bone mineral density measurement and are not obtained for diagnostic purposes. [id number] Exclusions: None. COMPARISON:  None. FINDINGS: Scan quality: Good. LUMBAR SPINE (L1-L4): BMD (in g/cm2): 1.186 T-score: 0.0 Z-score: 1.3 LEFT FEMORAL NECK: BMD (in g/cm2): 0.819 T-score: -1.6 Z-score: -0.2 LEFT TOTAL HIP: BMD (in g/cm2): 0.878 T-score: -1.0 Z-score: 0.2 RIGHT FEMORAL NECK: BMD  (in g/cm2): 0.856 T-score: -1.3 Z-score: 0.1 RIGHT TOTAL HIP: BMD (in g/cm2): 0.879 T-score: -1.0 Z-score: 0.2 FRAX 10-YEAR PROBABILITY OF FRACTURE: 10-year fracture risk is performed using the [employer] FRAX calculator based on patient-reported risk factors. Major osteoporotic fracture: 6.4% Hip fracture: 1.7% Other situations known to alter the reliability of the FRAX score should be considered when making treatment decisions, including chronic glucocorticoid use and past treatments. Further guidance on treatment can be found at the [facility]oporosis Foundation's website https://www.patton.com/. IMPRESSION: Osteopenia based on BMD. Fracture risk is increased. Increased risk is based on low BMD. RECOMMENDATIONS: 1. All patients should optimize calcium and vitamin D intake. 2. Consider FDA-approved medical therapies in postmenopausal women and men aged 42 years and older, based on the following: - A hip or vertebral (clinical or morphometric) fracture - T-score less than or equal to -2.5 and secondary causes have been excluded. - Low bone mass (T-score between -1.0 and -2.5) and a 10-year probability of a hip fracture greater than or equal to 3% or a 10-year probability of a major osteoporosis-related fracture greater than or equal to 20% based on the [place] -adapted WHO algorithm. - Clinician judgment and/or patient preferences may indicate treatment for people with 10-year fracture probabilities above or below these levels 3. Patients with diagnosis of osteoporosis or at high risk for fracture should have regular bone mineral density tests. For patients eligible for Medicare, routine testing is allowed once every 2 years. The testing frequency can be increased to one year for patients who have rapidly progressing disease, those who are receiving or discontinuing medical therapy to restore bone mass, or have additional risk factors. Electronically Signed   By: Sundra Engel M.D.   On: 04/20/2024 13:43

## 2024-05-11 NOTE — Patient Instructions (Addendum)
 [facility] at [facility] Discharge Instructions   You were seen and examined today by Dr. Cheree Cords.  He reviewed the results of your lab work which are normal/stable.   He reviewed the results of your bone density test which shows you have osteopenia (weakened bones).   Return as scheduled.    Thank you for choosing [facility] at [facility] to provide your oncology and hematology care.  To afford each patient quality time with our provider, please arrive at least 15 minutes before your scheduled appointment time.   If you have a lab appointment with the Cancer Center please come in thru the Main Entrance and check in at the main information desk.  You need to re-schedule your appointment should you arrive 10 or more minutes late.  We strive to give you quality time with our providers, and arriving late affects you and other patients whose appointments are after yours.  Also, if you no show three or more times for appointments you may be dismissed from the clinic at the providers discretion.     Again, thank you for choosing [facility].  Our hope is that these requests will decrease the amount of time that you wait before being seen by our physicians.       _____________________________________________________________  Should you have questions after your visit to [facility], please contact our office at [phone] and follow the prompts.  Our office hours are 8:00 a.m. and 4:30 p.m. Monday - Friday.  Please note that voicemails left after 4:00 p.m. may not be returned until the following business day.  We are closed weekends and major holidays.  You do have access to a nurse 24-7, just call the main number to the clinic [phone] and do not press any options, hold on the line and a nurse will answer the phone.    For prescription refill requests, have your pharmacy contact our office and allow 72 hours.     Due to Covid, you will need to wear a mask upon entering the hospital. If you do not have a mask, a mask will be given to you at the Main Entrance upon arrival. For doctor visits, patients may have 1 support person age 5 or older with them. For treatment visits, patients can not have anyone with them due to social distancing guidelines and our immunocompromised population.

## 2024-05-11 NOTE — Progress Notes (Signed)
Patient is taking Revlimid as prescribed.  She has not missed any doses and reports no side effects at this time.   

## 2024-05-12 ENCOUNTER — Other Ambulatory Visit: Payer: Self-pay

## 2024-05-17 ENCOUNTER — Other Ambulatory Visit: Payer: Self-pay

## 2024-05-18 ENCOUNTER — Inpatient Hospital Stay: Admitting: Hematology

## 2024-05-18 ENCOUNTER — Inpatient Hospital Stay

## 2024-05-18 VITALS — BP 149/89 | HR 57 | Temp 97.8°F | Resp 18 | Wt 153.8 lb

## 2024-05-18 DIAGNOSIS — C9000 Multiple myeloma not having achieved remission: Secondary | ICD-10-CM

## 2024-05-18 DIAGNOSIS — E538 Deficiency of other specified B group vitamins: Secondary | ICD-10-CM

## 2024-05-18 DIAGNOSIS — Z5112 Encounter for antineoplastic immunotherapy: Secondary | ICD-10-CM | POA: Diagnosis not present

## 2024-05-18 LAB — CBC WITH DIFFERENTIAL/PLATELET
Abs Immature Granulocytes: 0.02 10*3/uL (ref 0.00–0.07)
Basophils Absolute: 0.1 10*3/uL (ref 0.0–0.1)
Basophils Relative: 1 %
Eosinophils Absolute: 0.1 10*3/uL (ref 0.0–0.5)
Eosinophils Relative: 2 %
HCT: 30.7 % — ABNORMAL LOW (ref 36.0–46.0)
Hemoglobin: 9.6 g/dL — ABNORMAL LOW (ref 12.0–15.0)
Immature Granulocytes: 1 %
Lymphocytes Relative: 34 %
Lymphs Abs: 1.4 10*3/uL (ref 0.7–4.0)
MCH: 32 pg (ref 26.0–34.0)
MCHC: 31.3 g/dL (ref 30.0–36.0)
MCV: 102.3 fL — ABNORMAL HIGH (ref 80.0–100.0)
Monocytes Absolute: 0.3 10*3/uL (ref 0.1–1.0)
Monocytes Relative: 8 %
Neutro Abs: 2.2 10*3/uL (ref 1.7–7.7)
Neutrophils Relative %: 54 %
Platelets: 167 10*3/uL (ref 150–400)
RBC: 3 MIL/uL — ABNORMAL LOW (ref 3.87–5.11)
RDW: 16.8 % — ABNORMAL HIGH (ref 11.5–15.5)
WBC: 4 10*3/uL (ref 4.0–10.5)
nRBC: 0 % (ref 0.0–0.2)

## 2024-05-18 LAB — COMPREHENSIVE METABOLIC PANEL WITH GFR
ALT: 13 U/L (ref 0–44)
AST: 15 U/L (ref 15–41)
Albumin: 3.1 g/dL — ABNORMAL LOW (ref 3.5–5.0)
Alkaline Phosphatase: 90 U/L (ref 38–126)
Anion gap: 7 (ref 5–15)
BUN: 11 mg/dL (ref 8–23)
CO2: 26 mmol/L (ref 22–32)
Calcium: 8.9 mg/dL (ref 8.9–10.3)
Chloride: 104 mmol/L (ref 98–111)
Creatinine, Ser: 1.03 mg/dL — ABNORMAL HIGH (ref 0.44–1.00)
GFR, Estimated: 54 mL/min — ABNORMAL LOW (ref >60)
Glucose, Bld: 177 mg/dL — ABNORMAL HIGH (ref 70–99)
Potassium: 3.7 mmol/L (ref 3.5–5.1)
Sodium: 137 mmol/L (ref 135–145)
Total Bilirubin: 1.2 mg/dL (ref 0.0–1.2)
Total Protein: 6.4 g/dL — ABNORMAL LOW (ref 6.5–8.1)

## 2024-05-18 LAB — MAGNESIUM: Magnesium: 1.8 mg/dL (ref 1.7–2.4)

## 2024-05-18 MED ORDER — EPOETIN ALFA-EPBX 10000 UNIT/ML IJ SOLN
20000.0000 [IU] | Freq: Once | INTRAMUSCULAR | Status: AC
  Administered 2024-05-18: 20000 [IU] via SUBCUTANEOUS
  Filled 2024-05-18: qty 2

## 2024-05-18 MED ORDER — BORTEZOMIB CHEMO SQ INJECTION 3.5 MG (2.5MG/ML)
1.3000 mg/m2 | Freq: Once | INTRAMUSCULAR | Status: AC
  Administered 2024-05-18: 2.25 mg via SUBCUTANEOUS
  Filled 2024-05-18: qty 0.9

## 2024-05-18 MED ORDER — DEXAMETHASONE 4 MG PO TABS
20.0000 mg | ORAL_TABLET | Freq: Once | ORAL | Status: AC
  Administered 2024-05-18: 20 mg via ORAL
  Filled 2024-05-18: qty 5

## 2024-05-18 MED ORDER — PROCHLORPERAZINE MALEATE 10 MG PO TABS
10.0000 mg | ORAL_TABLET | Freq: Once | ORAL | Status: AC
  Administered 2024-05-18: 10 mg via ORAL
  Filled 2024-05-18: qty 1

## 2024-05-18 NOTE — Patient Instructions (Signed)
 [facility] - A DEPT OF [place]. [facility]  Discharge Instructions: Thank you for choosing [facility] to provide your oncology and hematology care.  If you have a lab appointment with the Cancer Center - please note that after April 8th, 2024, all labs will be drawn in the cancer center.  You do not have to check in or register with the main entrance as you have in the past but will complete your check-in in the cancer center.  Wear comfortable clothing and clothing appropriate for easy access to any Portacath or PICC line.   We strive to give you quality time with your provider. You may need to reschedule your appointment if you arrive late (15 or more minutes).  Arriving late affects you and other patients whose appointments are after yours.  Also, if you miss three or more appointments without notifying the office, you may be dismissed from the clinic at the provider's discretion.      For prescription refill requests, have your pharmacy contact our office and allow 72 hours for refills to be completed.    Today you received the following chemotherapy and/or immunotherapy agents Velcade  and Retacrit .  Bortezomib  Injection What is this medication? BORTEZOMIB  (bor TEZ oh mib) treats lymphoma. It may also be used to treat multiple myeloma, a type of bone marrow cancer. It works by blocking a protein that causes cancer cells to grow and multiply. This helps to slow or stop the spread of cancer cells. This medicine may be used for other purposes; ask your health care provider or pharmacist if you have questions. COMMON BRAND NAME(S): BORUZU, Velcade  What should I tell my care team before I take this medication? They need to know if you have any of these conditions: Dehydration Diabetes Heart disease Liver disease Tingling of the fingers or toes or other nerve disorder An unusual or allergic reaction to bortezomib , other medications, foods, dyes, or  preservatives If you or your partner are pregnant or trying to get pregnant Breastfeeding How should I use this medication? This medication is injected into a vein or under the skin. It is given by your care team in a hospital or clinic setting. Talk to your care team about the use of this medication in children. Special care may be needed. Overdosage: If you think you have taken too much of this medicine contact a poison control center or emergency room at once. NOTE: This medicine is only for you. Do not share this medicine with others. What if I miss a dose? Keep appointments for follow-up doses. It is important not to miss your dose. Call your care team if you are unable to keep an appointment. What may interact with this medication? Ketoconazole Rifampin This list may not describe all possible interactions. Give your health care provider a list of all the medicines, herbs, non-prescription drugs, or dietary supplements you use. Also tell them if you smoke, drink alcohol, or use illegal drugs. Some items may interact with your medicine. What should I watch for while using this medication? Your condition will be monitored carefully while you are receiving this medication. You may need blood work while taking this medication. This medication may affect your coordination, reaction time, or judgment. Do not drive or operate machinery until you know how this medication affects you. Sit up or stand slowly to reduce the risk of dizzy or fainting spells. Drinking alcohol with this medication can increase the risk of these  side effects. This medication may increase your risk of getting an infection. Call your care team for advice if you get a fever, chills, sore throat, or other symptoms of a cold or flu. Do not treat yourself. Try to avoid being around people who are sick. Check with your care team if you have severe diarrhea, nausea, and vomiting, or if you sweat a lot. The loss of too much body fluid  may make it dangerous for you to take this medication. Talk to your care team if you may be pregnant. Serious birth defects can occur if you take this medication during pregnancy and for 7 months after the last dose. You will need a negative pregnancy test before starting this medication. Contraception is recommended while taking this medication and for 7 months after the last dose. Your care team can help you find the option that works for you. If your partner can get pregnant, use a condom during sex while taking this medication and for 4 months after the last dose. Do not breastfeed while taking this medication and for 2 months after the last dose. This medication may cause infertility. Talk to your care team if you are concerned about your fertility. What side effects may I notice from receiving this medication? Side effects that you should report to your care team as soon as possible: Allergic reactions--skin rash, itching, hives, swelling of the face, lips, tongue, or throat Bleeding--bloody or black, tar-like stools, vomiting blood or brown material that looks like coffee grounds, red or dark brown urine, small red or purple spots on skin, unusual bruising or bleeding Bleeding in the brain--severe headache, stiff neck, confusion, dizziness, change in vision, numbness or weakness of the face, arm, or leg, trouble speaking, trouble walking, vomiting Bowel blockage--stomach cramping, unable to have a bowel movement or pass gas, loss of appetite, vomiting Heart failure--shortness of breath, swelling of the ankles, feet, or hands, sudden weight gain, unusual weakness or fatigue Infection--fever, chills, cough, sore throat, wounds that don't heal, pain or trouble when passing urine, general feeling of discomfort or being unwell Liver injury--right upper belly pain, loss of appetite, nausea, light-colored stool, dark yellow or brown urine, yellowing skin or eyes, unusual weakness or fatigue Low blood  pressure--dizziness, feeling faint or lightheaded, blurry vision Lung injury--shortness of breath or trouble breathing, cough, spitting up blood, chest pain, fever Pain, tingling, or numbness in the hands or feet Severe or prolonged diarrhea Stomach pain, bloody diarrhea, pale skin, unusual weakness or fatigue, decrease in the amount of urine, which may be signs of hemolytic uremic syndrome Sudden and severe headache, confusion, change in vision, seizures, which may be signs of posterior reversible encephalopathy syndrome (PRES) TTP--purple spots on the skin or inside the mouth, pale skin, yellowing skin or eyes, unusual weakness or fatigue, fever, fast or irregular heartbeat, confusion, change in vision, trouble speaking, trouble walking Tumor lysis syndrome (TLS)--nausea, vomiting, diarrhea, decrease in the amount of urine, dark urine, unusual weakness or fatigue, confusion, muscle pain or cramps, fast or irregular heartbeat, joint pain Side effects that usually do not require medical attention (report to your care team if they continue or are bothersome): Constipation Diarrhea Fatigue Loss of appetite Nausea This list may not describe all possible side effects. Call your doctor for medical advice about side effects. You may report side effects to FDA at 1-800-FDA-1088. Where should I keep my medication? This medication is given in a hospital or clinic. It will not be stored at home. NOTE:  This sheet is a summary. It may not cover all possible information. If you have questions about this medicine, talk to your doctor, pharmacist, or health care provider.  2024 Elsevier/Gold Standard (2022-05-19 00:00:00)  Epoetin  Alfa Injection What is this medication? EPOETIN  ALFA (e POE e tin AL fa) treats low levels of red blood cells (anemia) caused by kidney disease, chemotherapy, or HIV medications. It can also be used in people who are at risk for blood loss during surgery. It works by Building surveyor make more red blood cells, which reduces the need for blood transfusions. This medicine may be used for other purposes; ask your health care provider or pharmacist if you have questions. COMMON BRAND NAME(S): Epogen , Procrit , Retacrit  What should I tell my care team before I take this medication? They need to know if you have any of these conditions: Blood clots Cancer Heart disease High blood pressure On dialysis Seizures Stroke An unusual or allergic reaction to epoetin  alfa, albumin, benzyl alcohol, other medications, foods, dyes, or preservatives Pregnant or trying to get pregnant Breast-feeding How should I use this medication? This medication is injected into a vein or under the skin. It is usually given by your care team in a hospital or clinic setting. It may also be given at home. If you get this medication at home, you will be taught how to prepare and give it. Use exactly as directed. Take it as directed on the prescription label at the same time every day. Keep taking it unless your care team tells you to stop. It is important that you put your used needles and syringes in a special sharps container. Do not put them in a trash can. If you do not have a sharps container, call your pharmacist or care team to get one. A special MedGuide will be given to you by the pharmacist with each prescription and refill. Be sure to read this information carefully each time. Talk to your care team about the use of this medication in children. While this medication may be used in children as young as 1 month of age for selected conditions, precautions do apply. Overdosage: If you think you have taken too much of this medicine contact a poison control center or emergency room at once. NOTE: This medicine is only for you. Do not share this medicine with others. What if I miss a dose? If you miss a dose, take it as soon as you can. If it is almost time for your next dose, take only that dose. Do  not take double or extra doses. What may interact with this medication? Darbepoetin alfa Methoxy polyethylene glycol-epoetin  beta This list may not describe all possible interactions. Give your health care provider a list of all the medicines, herbs, non-prescription drugs, or dietary supplements you use. Also tell them if you smoke, drink alcohol, or use illegal drugs. Some items may interact with your medicine. What should I watch for while using this medication? Visit your care team for regular checks on your progress. Check your blood pressure as directed. Know what your blood pressure should be and when to contact your care team. Your condition will be monitored carefully while you are receiving this medication. You may need blood work while taking this medication. What side effects may I notice from receiving this medication? Side effects that you should report to your care team as soon as possible: Allergic reactions--skin rash, itching, hives, swelling of the face, lips, tongue, or throat Blood  clot--pain, swelling, or warmth in the leg, shortness of breath, chest pain Heart attack--pain or tightness in the chest, shoulders, arms, or jaw, nausea, shortness of breath, cold or clammy skin, feeling faint or lightheaded Increase in blood pressure Rash, fever, and swollen lymph nodes Redness, blistering, peeling, or loosening of the skin, including inside the mouth Seizures Stroke--sudden numbness or weakness of the face, arm, or leg, trouble speaking, confusion, trouble walking, loss of balance or coordination, dizziness, severe headache, change in vision Side effects that usually do not require medical attention (report to your care team if they continue or are bothersome): Bone, joint, or muscle pain Cough Headache Nausea Pain, redness, or irritation at injection site This list may not describe all possible side effects. Call your doctor for medical advice about side effects. You may  report side effects to FDA at 1-800-FDA-1088. Where should I keep my medication? Keep out of the reach of children and pets. Store in a refrigerator. Do not freeze. Do not shake. Protect from light. Keep this medication in the original container until you are ready to take it. See product for storage information. Get rid of any unused medication after the expiration date. To get rid of medications that are no longer needed or have expired: Take the medication to a medication take-back program. Check with your pharmacy or law enforcement to find a location. If you cannot return the medication, ask your pharmacist or care team how to get rid of the medication safely. NOTE: This sheet is a summary. It may not cover all possible information. If you have questions about this medicine, talk to your doctor, pharmacist, or health care provider.  2024 Elsevier/Gold Standard (2022-04-17 00:00:00)     To help prevent nausea and vomiting after your treatment, we encourage you to take your nausea medication as directed.  BELOW ARE SYMPTOMS THAT SHOULD BE REPORTED IMMEDIATELY: *FEVER GREATER THAN 100.4 F (38 C) OR HIGHER *CHILLS OR SWEATING *NAUSEA AND VOMITING THAT IS NOT CONTROLLED WITH YOUR NAUSEA MEDICATION *UNUSUAL SHORTNESS OF BREATH *UNUSUAL BRUISING OR BLEEDING *URINARY PROBLEMS (pain or burning when urinating, or frequent urination) *BOWEL PROBLEMS (unusual diarrhea, constipation, pain near the anus) TENDERNESS IN MOUTH AND THROAT WITH OR WITHOUT PRESENCE OF ULCERS (sore throat, sores in mouth, or a toothache) UNUSUAL RASH, SWELLING OR PAIN  UNUSUAL VAGINAL DISCHARGE OR ITCHING   Items with * indicate a potential emergency and should be followed up as soon as possible or go to the Emergency Department if any problems should occur.  Please show the CHEMOTHERAPY ALERT CARD or IMMUNOTHERAPY ALERT CARD at check-in to the Emergency Department and triage nurse.  Should you have questions after  your visit or need to cancel or reschedule your appointment, please contact [facility] [place] - A DEPT OF [facility] [phone]  and follow the prompts.  Office hours are 8:00 a.m. to 4:30 p.m. Monday - Friday. Please note that voicemails left after 4:00 p.m. may not be returned until the following business day.  We are closed weekends and major holidays. You have access to a nurse at all times for urgent questions. Please call the main number to the clinic [phone] and follow the prompts.  For any non-urgent questions, you may also contact your provider using MyChart. We now offer e-Visits for anyone 32 and older to request care online for non-urgent symptoms. For details visit [email or web address].   Also download the MyChart app! Go to the app store, search "MyChart",  open the app, select [facility], and log in with your MyChart username and password.

## 2024-05-18 NOTE — Progress Notes (Signed)
 Patient presents today for Retacrit  injection and chemotherapy injection of Velcade .  Patient is in satisfactory condition with no new complaints voiced.  Vital signs are stable.  Labs reviewed and all labs are within treatment parameters. Hgb 9.6.  We will proceed with treatment per MD orders.    Patient tolerated treatment well with no complaints voiced.  Patient left ambulatory in stable condition.  Vital signs stable at discharge.  Follow up as scheduled.

## 2024-05-19 ENCOUNTER — Other Ambulatory Visit: Payer: Self-pay | Admitting: *Deleted

## 2024-05-19 DIAGNOSIS — C9000 Multiple myeloma not having achieved remission: Secondary | ICD-10-CM

## 2024-05-19 MED ORDER — LENALIDOMIDE 10 MG PO CAPS
10.0000 mg | ORAL_CAPSULE | Freq: Every day | ORAL | 0 refills | Status: DC
Start: 2024-05-19 — End: 2024-06-22

## 2024-05-27 ENCOUNTER — Encounter (HOSPITAL_COMMUNITY): Payer: Self-pay

## 2024-05-27 ENCOUNTER — Other Ambulatory Visit: Payer: Self-pay

## 2024-05-27 ENCOUNTER — Emergency Department (HOSPITAL_COMMUNITY)

## 2024-05-27 ENCOUNTER — Inpatient Hospital Stay (HOSPITAL_COMMUNITY)
Admission: EM | Admit: 2024-05-27 | Discharge: 2024-05-30 | DRG: 543 | Disposition: A | Discharge status: Skilled Nursing Facility | Attending: Internal Medicine | Admitting: Internal Medicine

## 2024-05-27 DIAGNOSIS — Z79811 Long term (current) use of aromatase inhibitors: Secondary | ICD-10-CM | POA: Diagnosis not present

## 2024-05-27 DIAGNOSIS — Z7984 Long term (current) use of oral hypoglycemic drugs: Secondary | ICD-10-CM | POA: Diagnosis not present

## 2024-05-27 DIAGNOSIS — S32020A Wedge compression fracture of second lumbar vertebra, initial encounter for closed fracture: Secondary | ICD-10-CM | POA: Diagnosis not present

## 2024-05-27 DIAGNOSIS — Z79899 Other long term (current) drug therapy: Secondary | ICD-10-CM

## 2024-05-27 DIAGNOSIS — R109 Unspecified abdominal pain: Principal | ICD-10-CM

## 2024-05-27 DIAGNOSIS — D696 Thrombocytopenia, unspecified: Secondary | ICD-10-CM | POA: Diagnosis present

## 2024-05-27 DIAGNOSIS — N390 Urinary tract infection, site not specified: Secondary | ICD-10-CM | POA: Diagnosis present

## 2024-05-27 DIAGNOSIS — E876 Hypokalemia: Secondary | ICD-10-CM | POA: Diagnosis present

## 2024-05-27 DIAGNOSIS — M4856XA Collapsed vertebra, not elsewhere classified, lumbar region, initial encounter for fracture: Secondary | ICD-10-CM | POA: Diagnosis present

## 2024-05-27 DIAGNOSIS — N200 Calculus of kidney: Secondary | ICD-10-CM | POA: Diagnosis present

## 2024-05-27 DIAGNOSIS — D539 Nutritional anemia, unspecified: Secondary | ICD-10-CM | POA: Diagnosis present

## 2024-05-27 DIAGNOSIS — B961 Klebsiella pneumoniae [K. pneumoniae] as the cause of diseases classified elsewhere: Secondary | ICD-10-CM | POA: Diagnosis present

## 2024-05-27 DIAGNOSIS — M48061 Spinal stenosis, lumbar region without neurogenic claudication: Secondary | ICD-10-CM | POA: Diagnosis present

## 2024-05-27 DIAGNOSIS — K219 Gastro-esophageal reflux disease without esophagitis: Secondary | ICD-10-CM | POA: Insufficient documentation

## 2024-05-27 DIAGNOSIS — Z923 Personal history of irradiation: Secondary | ICD-10-CM

## 2024-05-27 DIAGNOSIS — Z8051 Family history of malignant neoplasm of kidney: Secondary | ICD-10-CM

## 2024-05-27 DIAGNOSIS — Z886 Allergy status to analgesic agent status: Secondary | ICD-10-CM | POA: Diagnosis not present

## 2024-05-27 DIAGNOSIS — C9000 Multiple myeloma not having achieved remission: Secondary | ICD-10-CM | POA: Diagnosis present

## 2024-05-27 DIAGNOSIS — I10 Essential (primary) hypertension: Secondary | ICD-10-CM | POA: Diagnosis present

## 2024-05-27 DIAGNOSIS — Z853 Personal history of malignant neoplasm of breast: Secondary | ICD-10-CM | POA: Diagnosis not present

## 2024-05-27 DIAGNOSIS — N3000 Acute cystitis without hematuria: Secondary | ICD-10-CM | POA: Diagnosis present

## 2024-05-27 DIAGNOSIS — I2510 Atherosclerotic heart disease of native coronary artery without angina pectoris: Secondary | ICD-10-CM | POA: Diagnosis present

## 2024-05-27 DIAGNOSIS — N3090 Cystitis, unspecified without hematuria: Secondary | ICD-10-CM | POA: Diagnosis not present

## 2024-05-27 DIAGNOSIS — C50911 Malignant neoplasm of unspecified site of right female breast: Secondary | ICD-10-CM | POA: Diagnosis present

## 2024-05-27 DIAGNOSIS — E1165 Type 2 diabetes mellitus with hyperglycemia: Secondary | ICD-10-CM | POA: Insufficient documentation

## 2024-05-27 DIAGNOSIS — M4316 Spondylolisthesis, lumbar region: Secondary | ICD-10-CM | POA: Diagnosis present

## 2024-05-27 DIAGNOSIS — Z803 Family history of malignant neoplasm of breast: Secondary | ICD-10-CM

## 2024-05-27 DIAGNOSIS — Z7982 Long term (current) use of aspirin: Secondary | ICD-10-CM

## 2024-05-27 LAB — LIPASE, BLOOD: Lipase: 24 U/L (ref 11–51)

## 2024-05-27 LAB — URINALYSIS, ROUTINE W REFLEX MICROSCOPIC
Bilirubin Urine: NEGATIVE
Glucose, UA: NEGATIVE mg/dL
Ketones, ur: NEGATIVE mg/dL
Nitrite: POSITIVE — AB
Protein, ur: 30 mg/dL — AB
Specific Gravity, Urine: 1.012 (ref 1.005–1.030)
pH: 7 (ref 5.0–8.0)

## 2024-05-27 LAB — CBC
HCT: 31.4 % — ABNORMAL LOW (ref 36.0–46.0)
Hemoglobin: 10 g/dL — ABNORMAL LOW (ref 12.0–15.0)
MCH: 32.1 pg (ref 26.0–34.0)
MCHC: 31.8 g/dL (ref 30.0–36.0)
MCV: 100.6 fL — ABNORMAL HIGH (ref 80.0–100.0)
Platelets: 141 10*3/uL — ABNORMAL LOW (ref 150–400)
RBC: 3.12 MIL/uL — ABNORMAL LOW (ref 3.87–5.11)
RDW: 17.4 % — ABNORMAL HIGH (ref 11.5–15.5)
WBC: 4 10*3/uL (ref 4.0–10.5)
nRBC: 0 % (ref 0.0–0.2)

## 2024-05-27 LAB — COMPREHENSIVE METABOLIC PANEL WITH GFR
ALT: 9 U/L (ref 0–44)
AST: 11 U/L — ABNORMAL LOW (ref 15–41)
Albumin: 2.9 g/dL — ABNORMAL LOW (ref 3.5–5.0)
Alkaline Phosphatase: 97 U/L (ref 38–126)
Anion gap: 7 (ref 5–15)
BUN: 10 mg/dL (ref 8–23)
CO2: 24 mmol/L (ref 22–32)
Calcium: 8.8 mg/dL — ABNORMAL LOW (ref 8.9–10.3)
Chloride: 106 mmol/L (ref 98–111)
Creatinine, Ser: 0.91 mg/dL (ref 0.44–1.00)
GFR, Estimated: >60 mL/min (ref >60)
Glucose, Bld: 176 mg/dL — ABNORMAL HIGH (ref 70–99)
Potassium: 2.9 mmol/L — ABNORMAL LOW (ref 3.5–5.1)
Sodium: 137 mmol/L (ref 135–145)
Total Bilirubin: 1.5 mg/dL — ABNORMAL HIGH (ref 0.0–1.2)
Total Protein: 6.4 g/dL — ABNORMAL LOW (ref 6.5–8.1)

## 2024-05-27 LAB — CBG MONITORING, ED: Glucose-Capillary: 176 mg/dL — ABNORMAL HIGH (ref 70–99)

## 2024-05-27 LAB — GLUCOSE, CAPILLARY: Glucose-Capillary: 168 mg/dL — ABNORMAL HIGH (ref 70–99)

## 2024-05-27 MED ORDER — POTASSIUM CHLORIDE CRYS ER 20 MEQ PO TBCR
40.0000 meq | EXTENDED_RELEASE_TABLET | Freq: Once | ORAL | Status: AC
  Administered 2024-05-27: 40 meq via ORAL
  Filled 2024-05-27: qty 2

## 2024-05-27 MED ORDER — ONDANSETRON HCL 4 MG/2ML IJ SOLN
4.0000 mg | Freq: Once | INTRAMUSCULAR | Status: AC | PRN
  Administered 2024-05-27: 4 mg via INTRAVENOUS
  Filled 2024-05-27: qty 2

## 2024-05-27 MED ORDER — ONDANSETRON HCL 4 MG/2ML IJ SOLN
4.0000 mg | Freq: Four times a day (QID) | INTRAMUSCULAR | Status: DC | PRN
  Administered 2024-05-28 – 2024-05-29 (×2): 4 mg via INTRAVENOUS
  Filled 2024-05-27 (×2): qty 2

## 2024-05-27 MED ORDER — PANTOPRAZOLE SODIUM 40 MG IV SOLR
40.0000 mg | Freq: Once | INTRAVENOUS | Status: AC
Start: 2024-05-27 — End: 2024-05-27
  Administered 2024-05-27: 40 mg via INTRAVENOUS
  Filled 2024-05-27: qty 10

## 2024-05-27 MED ORDER — POTASSIUM CHLORIDE 10 MEQ/100ML IV SOLN
10.0000 meq | INTRAVENOUS | Status: AC
  Administered 2024-05-27 (×2): 10 meq via INTRAVENOUS
  Filled 2024-05-27 (×2): qty 100

## 2024-05-27 MED ORDER — ENOXAPARIN SODIUM 40 MG/0.4ML IJ SOSY
40.0000 mg | PREFILLED_SYRINGE | INTRAMUSCULAR | Status: DC
  Administered 2024-05-27 – 2024-05-29 (×3): 40 mg via SUBCUTANEOUS
  Filled 2024-05-27 (×3): qty 0.4

## 2024-05-27 MED ORDER — ACETAMINOPHEN 325 MG PO TABS
650.0000 mg | ORAL_TABLET | Freq: Four times a day (QID) | ORAL | Status: DC | PRN
  Administered 2024-05-28 – 2024-05-29 (×2): 650 mg via ORAL
  Filled 2024-05-27 (×2): qty 2

## 2024-05-27 MED ORDER — OXYCODONE HCL 5 MG PO TABS
5.0000 mg | ORAL_TABLET | ORAL | Status: DC | PRN
  Administered 2024-05-28 – 2024-05-29 (×2): 5 mg via ORAL
  Filled 2024-05-27 (×3): qty 1

## 2024-05-27 MED ORDER — MORPHINE SULFATE (PF) 2 MG/ML IV SOLN
2.0000 mg | Freq: Once | INTRAVENOUS | Status: AC
  Administered 2024-05-27: 2 mg via INTRAVENOUS
  Filled 2024-05-27: qty 1

## 2024-05-27 MED ORDER — SODIUM CHLORIDE 0.9 % IV SOLN
2.0000 g | Freq: Once | INTRAVENOUS | Status: AC
  Administered 2024-05-27: 2 g via INTRAVENOUS
  Filled 2024-05-27: qty 20

## 2024-05-27 MED ORDER — INSULIN ASPART 100 UNIT/ML IJ SOLN
0.0000 [IU] | Freq: Three times a day (TID) | INTRAMUSCULAR | Status: DC
  Administered 2024-05-28 – 2024-05-29 (×4): 2 [IU] via SUBCUTANEOUS
  Administered 2024-05-29: 1 [IU] via SUBCUTANEOUS
  Administered 2024-05-30: 2 [IU] via SUBCUTANEOUS
  Administered 2024-05-30: 1 [IU] via SUBCUTANEOUS

## 2024-05-27 MED ORDER — OXYCODONE-ACETAMINOPHEN 5-325 MG PO TABS
1.0000 | ORAL_TABLET | Freq: Once | ORAL | Status: AC
  Administered 2024-05-27: 1 via ORAL
  Filled 2024-05-27: qty 1

## 2024-05-27 MED ORDER — ONDANSETRON HCL 4 MG PO TABS
4.0000 mg | ORAL_TABLET | Freq: Four times a day (QID) | ORAL | Status: DC | PRN
  Filled 2024-05-27: qty 1

## 2024-05-27 MED ORDER — MAGNESIUM SULFATE 2 GM/50ML IV SOLN
2.0000 g | Freq: Once | INTRAVENOUS | Status: AC
  Administered 2024-05-27: 2 g via INTRAVENOUS
  Filled 2024-05-27: qty 50

## 2024-05-27 MED ORDER — ACETAMINOPHEN 650 MG RE SUPP: 650.0000 mg | Freq: Four times a day (QID) | RECTAL | Status: DC | PRN

## 2024-05-27 MED ORDER — PANTOPRAZOLE SODIUM 40 MG PO TBEC: 40.0000 mg | DELAYED_RELEASE_TABLET | Freq: Once | ORAL | Status: DC

## 2024-05-27 MED ORDER — GADOBUTROL 1 MMOL/ML IV SOLN
7.0000 mL | Freq: Once | INTRAVENOUS | Status: AC | PRN
  Administered 2024-05-27: 7 mL via INTRAVENOUS

## 2024-05-27 NOTE — ED Notes (Signed)
 TSLO brace will arrive first thing in the AM

## 2024-05-27 NOTE — ED Notes (Signed)
 TLSO BRACE ORDERED [facility]RU [facility]

## 2024-05-27 NOTE — H&P (Signed)
 History and Physical    Patient: Brittany Archer HQI:[id number] DOB: [birth year] DOA: 05/27/2024 DOS: the patient was seen and examined on 05/27/2024 PCP: Twylla Galen, MD  Patient coming from: Home  Chief Complaint:  Chief Complaint  Patient presents with   Abdominal Pain    BIB [facility] from home with c/o rt lwr flank pain, abd pain with movement.    HPI: Brittany Archer is a 83 y.o. female with medical history significant of , CAD, type 2 diabetes mellitus, HTN, Hx of left breast cancer, multiple myeloma who presents to the emergency department from home via EMS due to low back pain.  Patient states that she does not know why she came to the hospital, most of the history was obtained from ED PA and sister at bedside.  Per report, patient has a long history (1 to 2 years) of mild intermittent low back pain which was usually well-controlled with Tylenol  up to earlier this week.  Patient has been complaining of worsening right-sided lower back pain within the last 3 days and it appears that Tylenol  has not been controlling the pain that much.  However, she continued to ambulate without any assistive device until this morning when she had difficulty in being able to get up from bed due to pain, so EMS was activated and patient was sent to the ED for further evaluation and management.  She denies burning sensation or urination or any other irritative bladder symptoms.  ED Course:  In the emergency department, respiratory rate on arrival to the ED was 21/min, BP was 156/60, but other vital signs were within normal range.  Workup in the ED showed macrocytic anemia and thrombocytopenia.  Urinalysis was positive for nitrite and trace leukocytes with only 21-50 WBCs. MRI lumbar spine with and without IV contrast showed acute appearing compression fracture of the L2 vertebral body with severe central height loss, marrow edema, and enhancement. Grade 1 anterolisthesis of L4 on L5 contributes to  moderate spinal canal stenosis at L4-L5. Severe right and moderate left neural foraminal narrowing at L5-S1. Empiric IV ceftriaxone  was given due to presumed UTI.  Potassium was replenished, morphine  and oxycodone were given, Protonix was also given.  Neurosurgery was consulted and recommended TLSO without any need for surgical intervention. Hospitalist was asked to admit patient for further evaluation and management.  Review of Systems: Review of systems as noted in the HPI. All other systems reviewed and are negative.   Past Medical History:  Diagnosis Date   Breast cancer (HCC)    left breast/ 2008/ surg/ rad tx   Coronary artery disease    Diabetes mellitus    Past Surgical History:  Procedure Laterality Date   ABDOMINAL HYSTERECTOMY     BREAST BIOPSY Left 03/24/2024   US  LT BREAST BX W LOC DEV 1ST LESION IMG BX SPEC US  GUIDE 03/24/2024 [facility]   BREAST BIOPSY Left 03/24/2024   US  LT BREAST BX W LOC DEV EA ADD LESION IMG BX SPEC US  GUIDE 03/24/2024 [facility]   BREAST BIOPSY Right 03/24/2024   MM RT BREAST BX W LOC DEV EA AD LESION IMG BX SPEC STEREO GUIDE 03/24/2024 [facility]   BREAST BIOPSY Right 03/24/2024   MM RT BREAST BX W LOC DEV 1ST LESION IMAGE BX SPEC STEREO GUIDE 03/24/2024 [facility]   BREAST SURGERY     DEBRIDEMENT MANDIBLE N/A 02/22/2020   Procedure: INCISION AND DRAINAGE WITH DEBRIDEMENT MANDIBLE;  Surgeon: Joseph Nickel, DMD;  Location:  [facility];  Service: Oral Surgery;  Laterality: N/A;   DEBRIDEMENT MANDIBLE Right 03/19/2021   Procedure: DEBRIDEMENT OF BONE RIGHT INTERIOR  MANDIBLE;  Surgeon: Joseph Nickel, DMD;  Location: [facility];  Service: Oral Surgery;  Laterality: Right;   EYE SURGERY  2021   cataract removals    TOOTH EXTRACTION N/A 02/22/2020   Procedure: DENTAL RESTORATION/EXTRACTIONS;  Surgeon: Joseph Nickel, DMD;  Location: [facility];  Service: Oral Surgery;  Laterality: N/A;  DENTAL KIT REQUESTED    Social History:  reports that she  has never smoked. She has never used smokeless tobacco. She reports that she does not drink alcohol and does not use drugs.   Allergies  Allergen Reactions   Motrin [Ibuprofen] Rash   Seasonal Ic [Cholestatin] Other (See Comments)    Sneezing, watery eyes    Family History  Problem Relation Age of Onset   Obesity Sister    Breast cancer Sister        dx 72s   Breast cancer Sister    Kidney cancer Brother      Prior to Admission medications   Medication Sig Start Date End Date Taking? Authorizing Provider  anastrozole  (ARIMIDEX ) 1 MG tablet Take 1 tablet (1 mg total) by mouth daily. 03/30/24  Yes Paulett Boros, MD  aspirin  81 MG tablet Take 1 tablet (81 mg total) by mouth daily with breakfast. 03/14/22  Yes Emokpae, Courage, MD  atorvastatin (LIPITOR) 10 MG tablet Take 10 mg by mouth daily. 08/10/23  Yes [provider]  bortezomib  IV (VELCADE ) 3.5 MG injection 3.5 mg once a week. weekly   Yes [provider]  famotidine  (PEPCID ) 20 MG tablet Take 20 mg by mouth at bedtime as needed. 03/16/24  Yes [provider]  lenalidomide  (REVLIMID ) 10 MG capsule Take 1 capsule (10 mg total) by mouth daily. Take daily for 21 days, with 7 days off 05/19/24  Yes Paulett Boros, MD  magnesium  oxide (MAG-OX) 400 (240 Mg) MG tablet TAKE ONE TABLET BY MOUTH THREE TIMES A DAY 05/01/24  Yes Paulett Boros, MD  metFORMIN  (GLUCOPHAGE -XR) 500 MG 24 hr tablet SMARTSIG:1 Tablet(s) By Mouth Every Evening 03/16/24  Yes [provider]  potassium chloride  SA (KLOR-CON  M) 20 MEQ tablet Take 20 mEq by mouth 2 (two) times daily. 03/18/24  Yes [provider]  glucose blood (ACCU-CHEK AVIVA PLUS) test strip CHECK BLOOD SUGAR ONCE DAILY    [provider]    Physical Exam: BP (!) 169/60 (BP Location: Right Arm)   Pulse 63   Temp 98.3 F (36.8 C) (Oral)   Resp 18   Ht 5\' 4"  (1.626 m)   Wt 69.4 kg   SpO2 100%   BMI 26.26 kg/m   General: 83 y.o.  year-old female well developed well nourished in no acute distress.  Alert and oriented x3. HEENT: NCAT, EOMI Neck: Supple, trachea medial Cardiovascular: Regular rate and rhythm with no rubs or gallops.  No thyromegaly or JVD noted.  No lower extremity edema. 2/4 pulses in all 4 extremities. Respiratory: Clear to auscultation with no wheezes or rales. Good inspiratory effort. Abdomen: Soft, nontender nondistended with normal bowel sounds x4 quadrants. Muskuloskeletal: Tender to palpation of right paraspinal muscles to midline of L1-L3. Neuro: CN II-XII intact, sensation, reflexes intact Skin: No ulcerative lesions noted or rashes Psychiatry: Mood is appropriate for condition and setting          Labs on Admission:  Basic Metabolic Panel: Recent Labs  Lab 05/27/24  1008  NA 137  K 2.9*  CL 106  CO2 24  GLUCOSE 176*  BUN 10  CREATININE 0.91  CALCIUM 8.8*   Liver Function Tests: Recent Labs  Lab 05/27/24 1008  AST 11*  ALT 9  ALKPHOS 97  BILITOT 1.5*  PROT 6.4*  ALBUMIN 2.9*   Recent Labs  Lab 05/27/24 1008  LIPASE 24   No results for input(s): "AMMONIA" in the last 168 hours. CBC: Recent Labs  Lab 05/27/24 1008  WBC 4.0  HGB 10.0*  HCT 31.4*  MCV 100.6*  PLT 141*   Cardiac Enzymes: No results for input(s): "CKTOTAL", "CKMB", "CKMBINDEX", "TROPONINI" in the last 168 hours.  BNP (last 3 results) No results for input(s): "BNP" in the last 8760 hours.  ProBNP (last 3 results) No results for input(s): "PROBNP" in the last 8760 hours.  CBG: Recent Labs  Lab 05/27/24 0946 05/27/24 2139  GLUCAP 176* 168*    Radiological Exams on Admission: MR Lumbar Spine W Wo Contrast Result Date: 05/27/2024 EXAM: MR Lumbar Spine with and without intravenous contrast. 05/27/2024 03:15:57 PM TECHNIQUE: Multiplanar multisequence MRI of the lumbar spine was performed with and without the administration of intravenous contrast. COMPARISON: Lumbar spine CT dated 05/27/2024.  CLINICAL HISTORY: Low back pain, cancer suspected; Back pain history of multiple myeloma. F/u L2 fracture seen on CT today. FINDINGS: BONES AND ALIGNMENT: Conventional lumbosacral anatomy is assumed with 5 non-rib-bearing, lumbar-type vertebral bodies. Compression fracture of the L2 vertebral body with severe central height loss, marrow edema, and enhancement. Grade 1 anterolisthesis of L4 on L5. SPINAL CORD: The conus terminates at L2. SOFT TISSUES: Partially imaged cystic lesion arising from the lower pole of the left kidney. Attention on same day abdominal CT. Moderate fatty atrophy of the paraspinal muscles. L1-L2: Small disc bulge. L2-L3: Facet arthropathy. L3-L4: Left eccentric disc bulge and mild bilateral facet arthropathy contribute to moderate left neural foraminal narrowing. L4-L5: Anterolisthesis with uncovered disc and moderate bilateral facet arthropathy with periarticular edema and joint effusions contribute to moderate spinal canal stenosis and moderate left neural foraminal narrowing. L5-S1: Disc bulge and right greater than left facet arthropathy result in severe right and moderate left neural foraminal narrowing. IMPRESSION: 1. Acute appearing compression fracture of the L2 vertebral body with severe central height loss, marrow edema, and enhancement. 2. Grade 1 anterolisthesis of L4 on L5 contributes to moderate spinal canal stenosis at L4-L5. 3. Severe right and moderate left neural foraminal narrowing at L5-S1. Electronically signed by: Audra Blend MD 05/27/2024 04:22 PM EDT RP Workstation: [id number]   CT Renal Stone Study Result Date: 05/27/2024 CLINICAL DATA:  Abdominal and flank pain, history of multiple myeloma * Tracking Code: BO * EXAM: CT ABDOMEN AND PELVIS WITHOUT CONTRAST CT LUMBAR SPINE WITHOUT CONTRAST TECHNIQUE: Multidetector CT imaging of the abdomen and pelvis was performed following the standard protocol without IV contrast. Multidetector CT imaging of the lumbar spine was  performed following the standard protocol without IV contrast. RADIATION DOSE REDUCTION: This exam was performed according to the departmental dose-optimization program which includes automated exposure control, adjustment of the mA and/or kV according to patient size and/or use of iterative reconstruction technique. COMPARISON:  PET-CT, 12/16/2023 FINDINGS: CT ABDOMEN PELVIS FINDINGS Lower chest: No acute findings. Bandlike scarring or atelectasis of the lung bases. Coronary artery calcifications. Small hiatal hernia. Hepatobiliary: No solid liver abnormality is seen. Faintly calcified gallstones (series 6, image 56). No biliary ductal dilatation. Pneumobilia (series 3, image 19). Pancreas: Unremarkable. No pancreatic ductal  dilatation or surrounding inflammatory changes. Spleen: Normal in size without significant abnormality. Adrenals/Urinary Tract: Adrenal glands are unremarkable. Mildly atrophic kidneys. Punctuate nonobstructive calculus of the inferior pole of the right kidney (series 6, image 62). Bladder is unremarkable. Stomach/Bowel: Stomach is within normal limits. Appendix appears normal. No evidence of bowel wall thickening, distention, or inflammatory changes. Descending and sigmoid diverticulosis. Vascular/Lymphatic: Aortic atherosclerosis. No enlarged abdominal or pelvic lymph nodes. Reproductive: Status post hysterectomy. Other: No abdominal wall hernia or abnormality. No ascites. Musculoskeletal: No acute osseous findings. CT LUMBAR SPINE FINDINGS Alignment: Normal lumbar lordosis. Vertebral bodies: Increased superior and inferior endplate deformities of L2, of uncertain acuity (series 503, image 44). No other evidence of fracture. No suspicious osseous lesions. Disc spaces: Mild multilevel disc space height loss and osteophytosis. Mild facet degenerative change of the lower lumbar levels. Paraspinous soft tissues: Unremarkable. IMPRESSION: 1. Increased superior and inferior endplate deformities of  L2, of uncertain acuity. Correlate for point tenderness. MRI may be used to assess for pathologic fracture and fracture acuity. 2. No other CT evidence of acute abnormality of the abdomen or pelvis. 3. Punctuate nonobstructive calculus of the inferior pole of the right kidney. 4. Cholelithiasis without evidence of acute cholecystitis. 5. Descending and sigmoid diverticulosis without evidence of acute diverticulitis. 6. Coronary artery disease. Aortic Atherosclerosis (ICD10-I70.0). Electronically Signed   By: Fredricka Jenny M.D.   On: 05/27/2024 11:20   CT L-SPINE NO CHARGE Result Date: 05/27/2024 CLINICAL DATA:  Abdominal and flank pain, history of multiple myeloma * Tracking Code: BO * EXAM: CT ABDOMEN AND PELVIS WITHOUT CONTRAST CT LUMBAR SPINE WITHOUT CONTRAST TECHNIQUE: Multidetector CT imaging of the abdomen and pelvis was performed following the standard protocol without IV contrast. Multidetector CT imaging of the lumbar spine was performed following the standard protocol without IV contrast. RADIATION DOSE REDUCTION: This exam was performed according to the departmental dose-optimization program which includes automated exposure control, adjustment of the mA and/or kV according to patient size and/or use of iterative reconstruction technique. COMPARISON:  PET-CT, 12/16/2023 FINDINGS: CT ABDOMEN PELVIS FINDINGS Lower chest: No acute findings. Bandlike scarring or atelectasis of the lung bases. Coronary artery calcifications. Small hiatal hernia. Hepatobiliary: No solid liver abnormality is seen. Faintly calcified gallstones (series 6, image 56). No biliary ductal dilatation. Pneumobilia (series 3, image 19). Pancreas: Unremarkable. No pancreatic ductal dilatation or surrounding inflammatory changes. Spleen: Normal in size without significant abnormality. Adrenals/Urinary Tract: Adrenal glands are unremarkable. Mildly atrophic kidneys. Punctuate nonobstructive calculus of the inferior pole of the right  kidney (series 6, image 62). Bladder is unremarkable. Stomach/Bowel: Stomach is within normal limits. Appendix appears normal. No evidence of bowel wall thickening, distention, or inflammatory changes. Descending and sigmoid diverticulosis. Vascular/Lymphatic: Aortic atherosclerosis. No enlarged abdominal or pelvic lymph nodes. Reproductive: Status post hysterectomy. Other: No abdominal wall hernia or abnormality. No ascites. Musculoskeletal: No acute osseous findings. CT LUMBAR SPINE FINDINGS Alignment: Normal lumbar lordosis. Vertebral bodies: Increased superior and inferior endplate deformities of L2, of uncertain acuity (series 503, image 44). No other evidence of fracture. No suspicious osseous lesions. Disc spaces: Mild multilevel disc space height loss and osteophytosis. Mild facet degenerative change of the lower lumbar levels. Paraspinous soft tissues: Unremarkable. IMPRESSION: 1. Increased superior and inferior endplate deformities of L2, of uncertain acuity. Correlate for point tenderness. MRI may be used to assess for pathologic fracture and fracture acuity. 2. No other CT evidence of acute abnormality of the abdomen or pelvis. 3. Punctuate nonobstructive calculus of the inferior pole of  the right kidney. 4. Cholelithiasis without evidence of acute cholecystitis. 5. Descending and sigmoid diverticulosis without evidence of acute diverticulitis. 6. Coronary artery disease. Aortic Atherosclerosis (ICD10-I70.0). Electronically Signed   By: Fredricka Jenny M.D.   On: 05/27/2024 11:20    EKG: I independently viewed the EKG done and my findings are as followed: Normal sinus rhythm at a rate of 68 bpm  Assessment/Plan Present on Admission:  Closed compression fracture of L2 lumbar vertebra, initial encounter (HCC)  Multiple myeloma not having achieved remission (HCC)  Hypokalemia  Coronary artery disease  UTI (urinary tract infection)  Macrocytic anemia  Bilateral breast cancer (HCC)  Principal  Problem:   Closed compression fracture of L2 lumbar vertebra, initial encounter (HCC) Active Problems:   UTI (urinary tract infection)   Multiple myeloma not having achieved remission (HCC)   Hypokalemia   Coronary artery disease   Macrocytic anemia   Bilateral breast cancer (HCC)   Type 2 diabetes mellitus with hyperglycemia (HCC)   GERD (gastroesophageal reflux disease)  Closed compression fracture of L2 lumbar vertebra Continue Tylenol  as needed for mild pain Continue oxycodone 5 mg every 4 hours as needed for moderate/severe pain Continue TLSO Continue fall precaution Continue PT and OT eval and treat  Presumed UTI POA Patient was empirically started on IV ceftriaxone .   Prior urine culture in 02/2022 was positive for E. coli and was sensitive to ceftriaxone .  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 Cheree Cords  Multiple myeloma Patient is on Revlimid  and Velcade   Patient follows with Dr. Cheree Cords Last office visit was on 05/11/2024  DVT prophylaxis: Lovenox   Code Status: Full code  Family Communication: Sister at bedside (all questions answered to satisfaction)  Consults: None  Severity of Illness: The appropriate patient status for this patient is INPATIENT. Inpatient status is judged to be reasonable and necessary in order to provide the required intensity of service to ensure the patient's  safety. The patient's presenting symptoms, physical exam findings, and initial radiographic and laboratory data in the context of their chronic comorbidities is felt to place them at high risk for further clinical deterioration. Furthermore, it is not anticipated that the patient will be medically stable for discharge from the hospital within 2 midnights of admission.   * I certify that at the point of admission it is my clinical judgment that the patient will require inpatient hospital care spanning beyond 2 midnights from the point of admission due to high intensity of service, high risk for further deterioration and high frequency of surveillance required.*  Author: Arian Murley, DO 05/27/2024 9:56 PM  For on call review [email or web address].

## 2024-05-27 NOTE — ED Notes (Signed)
 Patient BP elevated, 180/69. Denies pain. MD notified.

## 2024-05-27 NOTE — ED Triage Notes (Signed)
 Increased abd pain after breakfast. Frequent belching at baseline, also observed here. Gallbladder and appy intact. Abd distended.

## 2024-05-27 NOTE — ED Provider Notes (Signed)
 [facility] AT [facility] Provider Note   CSN: [id number] Arrival date & time: 05/27/24  2595     History  Chief Complaint  Patient presents with   Abdominal Pain    BIB [facility] from home with c/o rt lwr flank pain, abd pain with movement.     Brittany Archer is a 83 y.o. female.   Abdominal Pain Associated symptoms: no chest pain, no chills, no cough, no diarrhea, no dysuria, no fever, no nausea, no shortness of breath and no vomiting         Brittany Archer is a 83 y.o. female with past medical history of type 2 diabetes, hypertension, CKD, dementia, breast cancer with multiple myeloma comes from home for evaluation accompanied by her sister who provides most of history.  Patient lives with her sister and sister endorses generalized weakness this morning and decreased appetite.  Patient complains of right flank pain with pain radiating toward her right hip.  Patient and sister deny fall.  Patient denies any pain into her right leg numbness or weakness of her extremities or pain that radiates into her abdomen.  Denies any nausea vomiting or diarrhea.  No fever or chills. She is not having dysuria symptoms.  She is having frequent belching which she states is baseline for her.      Home Medications Prior to Admission medications   Medication Sig Start Date End Date Taking? Authorizing Provider  acyclovir  (ZOVIRAX ) 400 MG tablet Take 400 mg by mouth 2 (two) times daily. 10/19/22   [provider]  anastrozole  (ARIMIDEX ) 1 MG tablet Take 1 tablet (1 mg total) by mouth daily. 03/30/24   Paulett Boros, MD  aspirin  81 MG tablet Take 1 tablet (81 mg total) by mouth daily with breakfast. 03/14/22   Colin Dawley, MD  atorvastatin (LIPITOR) 10 MG tablet Take 10 mg by mouth daily. 08/10/23   [provider]  bortezomib  IV (VELCADE ) 3.5 MG injection 3.5 mg once a week. weekly    [provider]  chlorhexidine   (PERIDEX ) 0.12 % solution Use as directed 15 mLs in the mouth or throat 3 (three) times daily. 04/04/20   [provider]  cholecalciferol (VITAMIN D) 25 MCG (1000 UNIT) tablet 1 capsule    [provider]  famotidine  (PEPCID ) 20 MG tablet Take 20 mg by mouth at bedtime as needed. 03/16/24   [provider]  glucose blood (ACCU-CHEK AVIVA PLUS) test strip CHECK BLOOD SUGAR ONCE DAILY    [provider]  GNP ASPIRIN  LOW DOSE 81 MG tablet Take 81 mg by mouth daily. 07/09/22   [provider]  lenalidomide  (REVLIMID ) 10 MG capsule Take 1 capsule (10 mg total) by mouth daily. Take daily for 21 days, with 7 days off 05/19/24   Paulett Boros, MD  magnesium  oxide (MAG-OX) 400 (240 Mg) MG tablet TAKE ONE TABLET BY MOUTH THREE TIMES A DAY 05/01/24   Paulett Boros, MD  metFORMIN  (GLUCOPHAGE -XR) 500 MG 24 hr tablet SMARTSIG:1 Tablet(s) By Mouth Every Evening 03/16/24   [provider]  mirtazapine  (REMERON ) 15 MG tablet Take 0.5 tablets (7.5 mg total) by mouth at bedtime. For appetite stimulation 03/14/22   Colin Dawley, MD  ondansetron  (ZOFRAN ) 8 MG tablet     [provider]  Potassium Acetate  POWD Take 20 mEq by mouth daily. 12/09/21   Paulett Boros, MD  potassium chloride  (KLOR-CON ) 20 MEQ packet MIX AND TAKE ONE PACKET DAILY. 01/25/24  Paulett Boros, MD  potassium chloride  SA (KLOR-CON  M) 20 MEQ tablet Take by mouth. 03/18/24   [provider]      Allergies    Other, Seasonal ic [cholestatin], and Motrin [ibuprofen]    Review of Systems   Review of Systems  Constitutional:  Negative for chills and fever.  Respiratory:  Negative for cough and shortness of breath.   Cardiovascular:  Negative for chest pain.  Gastrointestinal:  Negative for abdominal pain, diarrhea, nausea and vomiting.  Genitourinary:  Positive for flank pain. Negative for difficulty urinating, dysuria and frequency.  Musculoskeletal:   Negative for neck pain.  Skin:  Negative for color change and rash.  Neurological:  Negative for weakness and numbness.    Physical Exam Updated Vital Signs BP (!) 168/60   Pulse (!) 41   Temp 98.8 F (37.1 C) (Oral)   Resp (!) 22   Ht 5\' 4"  (1.626 m)   Wt 69.4 kg   SpO2 97%   BMI 26.26 kg/m  Physical Exam Vitals and nursing note reviewed.  Constitutional:      Appearance: She is well-developed.  HENT:     Mouth/Throat:     Mouth: Mucous membranes are moist.  Cardiovascular:     Rate and Rhythm: Normal rate and regular rhythm.     Pulses: Normal pulses.  Pulmonary:     Effort: Pulmonary effort is normal.     Breath sounds: Normal breath sounds.  Chest:     Chest wall: No tenderness.  Abdominal:     Palpations: Abdomen is soft.     Tenderness: There is no abdominal tenderness. There is no right CVA tenderness, left CVA tenderness or guarding.  Musculoskeletal:        General: Normal range of motion.     Right lower leg: No edema.     Left lower leg: No edema.     Comments: Ttp right lower lumbar paraspinal muscles extending to midline  Skin:    General: Skin is warm.     Capillary Refill: Capillary refill takes less than 2 seconds.  Neurological:     General: No focal deficit present.     Mental Status: She is alert.     Sensory: No sensory deficit.     Motor: No weakness.     ED Results / Procedures / Treatments   Labs (all labs ordered are listed, but only abnormal results are displayed) Labs Reviewed  COMPREHENSIVE METABOLIC PANEL WITH GFR - Abnormal; Notable for the following components:      Result Value   Potassium 2.9 (*)    Glucose, Bld 176 (*)    Calcium 8.8 (*)    Total Protein 6.4 (*)    Albumin 2.9 (*)    AST 11 (*)    Total Bilirubin 1.5 (*)    All other components within normal limits  CBC - Abnormal; Notable for the following components:   RBC 3.12 (*)    Hemoglobin 10.0 (*)    HCT 31.4 (*)    MCV 100.6 (*)    RDW 17.4 (*)     Platelets 141 (*)    All other components within normal limits  URINALYSIS, ROUTINE W REFLEX MICROSCOPIC - Abnormal; Notable for the following components:   APPearance HAZY (*)    Hgb urine dipstick SMALL (*)    Protein, ur 30 (*)    Nitrite POSITIVE (*)    Leukocytes,Ua TRACE (*)    Bacteria, UA FEW (*)  All other components within normal limits  CBG MONITORING, ED - Abnormal; Notable for the following components:   Glucose-Capillary 176 (*)    All other components within normal limits  URINE CULTURE  LIPASE, BLOOD    EKG None  Radiology MR Lumbar Spine W Wo Contrast Result Date: 05/27/2024 EXAM: MR Lumbar Spine with and without intravenous contrast. 05/27/2024 03:15:57 PM TECHNIQUE: Multiplanar multisequence MRI of the lumbar spine was performed with and without the administration of intravenous contrast. COMPARISON: Lumbar spine CT dated 05/27/2024. CLINICAL HISTORY: Low back pain, cancer suspected; Back pain history of multiple myeloma. F/u L2 fracture seen on CT today. FINDINGS: BONES AND ALIGNMENT: Conventional lumbosacral anatomy is assumed with 5 non-rib-bearing, lumbar-type vertebral bodies. Compression fracture of the L2 vertebral body with severe central height loss, marrow edema, and enhancement. Grade 1 anterolisthesis of L4 on L5. SPINAL CORD: The conus terminates at L2. SOFT TISSUES: Partially imaged cystic lesion arising from the lower pole of the left kidney. Attention on same day abdominal CT. Moderate fatty atrophy of the paraspinal muscles. L1-L2: Small disc bulge. L2-L3: Facet arthropathy. L3-L4: Left eccentric disc bulge and mild bilateral facet arthropathy contribute to moderate left neural foraminal narrowing. L4-L5: Anterolisthesis with uncovered disc and moderate bilateral facet arthropathy with periarticular edema and joint effusions contribute to moderate spinal canal stenosis and moderate left neural foraminal narrowing. L5-S1: Disc bulge and right greater than  left facet arthropathy result in severe right and moderate left neural foraminal narrowing. IMPRESSION: 1. Acute appearing compression fracture of the L2 vertebral body with severe central height loss, marrow edema, and enhancement. 2. Grade 1 anterolisthesis of L4 on L5 contributes to moderate spinal canal stenosis at L4-L5. 3. Severe right and moderate left neural foraminal narrowing at L5-S1. Electronically signed by: Audra Blend MD 05/27/2024 04:22 PM EDT RP Workstation: [id number]   CT Renal Stone Study Result Date: 05/27/2024 CLINICAL DATA:  Abdominal and flank pain, history of multiple myeloma * Tracking Code: BO * EXAM: CT ABDOMEN AND PELVIS WITHOUT CONTRAST CT LUMBAR SPINE WITHOUT CONTRAST TECHNIQUE: Multidetector CT imaging of the abdomen and pelvis was performed following the standard protocol without IV contrast. Multidetector CT imaging of the lumbar spine was performed following the standard protocol without IV contrast. RADIATION DOSE REDUCTION: This exam was performed according to the departmental dose-optimization program which includes automated exposure control, adjustment of the mA and/or kV according to patient size and/or use of iterative reconstruction technique. COMPARISON:  PET-CT, 12/16/2023 FINDINGS: CT ABDOMEN PELVIS FINDINGS Lower chest: No acute findings. Bandlike scarring or atelectasis of the lung bases. Coronary artery calcifications. Small hiatal hernia. Hepatobiliary: No solid liver abnormality is seen. Faintly calcified gallstones (series 6, image 56). No biliary ductal dilatation. Pneumobilia (series 3, image 19). Pancreas: Unremarkable. No pancreatic ductal dilatation or surrounding inflammatory changes. Spleen: Normal in size without significant abnormality. Adrenals/Urinary Tract: Adrenal glands are unremarkable. Mildly atrophic kidneys. Punctuate nonobstructive calculus of the inferior pole of the right kidney (series 6, image 62). Bladder is unremarkable.  Stomach/Bowel: Stomach is within normal limits. Appendix appears normal. No evidence of bowel wall thickening, distention, or inflammatory changes. Descending and sigmoid diverticulosis. Vascular/Lymphatic: Aortic atherosclerosis. No enlarged abdominal or pelvic lymph nodes. Reproductive: Status post hysterectomy. Other: No abdominal wall hernia or abnormality. No ascites. Musculoskeletal: No acute osseous findings. CT LUMBAR SPINE FINDINGS Alignment: Normal lumbar lordosis. Vertebral bodies: Increased superior and inferior endplate deformities of L2, of uncertain acuity (series 503, image 44). No other evidence of fracture. No suspicious osseous  lesions. Disc spaces: Mild multilevel disc space height loss and osteophytosis. Mild facet degenerative change of the lower lumbar levels. Paraspinous soft tissues: Unremarkable. IMPRESSION: 1. Increased superior and inferior endplate deformities of L2, of uncertain acuity. Correlate for point tenderness. MRI may be used to assess for pathologic fracture and fracture acuity. 2. No other CT evidence of acute abnormality of the abdomen or pelvis. 3. Punctuate nonobstructive calculus of the inferior pole of the right kidney. 4. Cholelithiasis without evidence of acute cholecystitis. 5. Descending and sigmoid diverticulosis without evidence of acute diverticulitis. 6. Coronary artery disease. Aortic Atherosclerosis (ICD10-I70.0). Electronically Signed   By: Fredricka Jenny M.D.   On: 05/27/2024 11:20   CT L-SPINE NO CHARGE Result Date: 05/27/2024 CLINICAL DATA:  Abdominal and flank pain, history of multiple myeloma * Tracking Code: BO * EXAM: CT ABDOMEN AND PELVIS WITHOUT CONTRAST CT LUMBAR SPINE WITHOUT CONTRAST TECHNIQUE: Multidetector CT imaging of the abdomen and pelvis was performed following the standard protocol without IV contrast. Multidetector CT imaging of the lumbar spine was performed following the standard protocol without IV contrast. RADIATION DOSE REDUCTION:  This exam was performed according to the departmental dose-optimization program which includes automated exposure control, adjustment of the mA and/or kV according to patient size and/or use of iterative reconstruction technique. COMPARISON:  PET-CT, 12/16/2023 FINDINGS: CT ABDOMEN PELVIS FINDINGS Lower chest: No acute findings. Bandlike scarring or atelectasis of the lung bases. Coronary artery calcifications. Small hiatal hernia. Hepatobiliary: No solid liver abnormality is seen. Faintly calcified gallstones (series 6, image 56). No biliary ductal dilatation. Pneumobilia (series 3, image 19). Pancreas: Unremarkable. No pancreatic ductal dilatation or surrounding inflammatory changes. Spleen: Normal in size without significant abnormality. Adrenals/Urinary Tract: Adrenal glands are unremarkable. Mildly atrophic kidneys. Punctuate nonobstructive calculus of the inferior pole of the right kidney (series 6, image 62). Bladder is unremarkable. Stomach/Bowel: Stomach is within normal limits. Appendix appears normal. No evidence of bowel wall thickening, distention, or inflammatory changes. Descending and sigmoid diverticulosis. Vascular/Lymphatic: Aortic atherosclerosis. No enlarged abdominal or pelvic lymph nodes. Reproductive: Status post hysterectomy. Other: No abdominal wall hernia or abnormality. No ascites. Musculoskeletal: No acute osseous findings. CT LUMBAR SPINE FINDINGS Alignment: Normal lumbar lordosis. Vertebral bodies: Increased superior and inferior endplate deformities of L2, of uncertain acuity (series 503, image 44). No other evidence of fracture. No suspicious osseous lesions. Disc spaces: Mild multilevel disc space height loss and osteophytosis. Mild facet degenerative change of the lower lumbar levels. Paraspinous soft tissues: Unremarkable. IMPRESSION: 1. Increased superior and inferior endplate deformities of L2, of uncertain acuity. Correlate for point tenderness. MRI may be used to assess for  pathologic fracture and fracture acuity. 2. No other CT evidence of acute abnormality of the abdomen or pelvis. 3. Punctuate nonobstructive calculus of the inferior pole of the right kidney. 4. Cholelithiasis without evidence of acute cholecystitis. 5. Descending and sigmoid diverticulosis without evidence of acute diverticulitis. 6. Coronary artery disease. Aortic Atherosclerosis (ICD10-I70.0). Electronically Signed   By: Fredricka Jenny M.D.   On: 05/27/2024 11:20    Procedures Procedures    Medications Ordered in ED Medications  pantoprazole (PROTONIX) EC tablet 40 mg (has no administration in time range)  ondansetron  (ZOFRAN ) injection 4 mg (4 mg Intravenous Given 05/27/24 1220)  morphine  (PF) 2 MG/ML injection 2 mg (2 mg Intravenous Given 05/27/24 1219)  cefTRIAXone  (ROCEPHIN ) 2 g in sodium chloride  0.9 % 100 mL IVPB (0 g Intravenous Stopping previously hung infusion 05/27/24 1747)  potassium chloride  SA (KLOR-CON  M) CR tablet  40 mEq (40 mEq Oral Given 05/27/24 1220)  potassium chloride  10 mEq in 100 mL IVPB (0 mEq Intravenous Stopping previously hung infusion 05/27/24 1747)  magnesium  sulfate IVPB 2 g 50 mL (0 g Intravenous Stopping previously hung infusion 05/27/24 1747)  gadobutrol  (GADAVIST ) 1 MMOL/ML injection 7 mL (7 mLs Intravenous Contrast Given 05/27/24 1516)  oxyCODONE-acetaminophen  (PERCOCET/ROXICET) 5-325 MG per tablet 1 tablet (1 tablet Oral Given 05/27/24 1742)    ED Course/ Medical Decision Making/ A&P                                 Medical Decision Making Pt here from home for evaluation of right flank pain since this morning.  Pt's sister at bedside endorses generalized weakness today and states that pt was too weak to get out of bed this morning she denies any fever chills nausea vomiting or diarrhea.  Remote history of kidney stones but patient cannot recall if current symptoms feel similar  Given patient's history of multiple myeloma pain could be related to spinal process,  kidney stone, pyelonephritis, bony process of the hip all considered.  Acute intra-abdominal process also considered but patient has reassuring abdominal exam.  Amount and/or Complexity of Data Reviewed Labs: ordered.    Details: Labs no evidence of leukocytosis, chemistries show hypokalemia with potassium of 2.9 urinalysis shows UTI urine culture pending Radiology: ordered.    Details: CT renal stone study without acute abnormality of abdomen or pelvis.  CT L-spine shows possible endplate deformities of L2 MRI recommended for evaluation of pathologic fracture  MRI shows an acute appearing compression fracture of the L2 vertebral body with severe central height loss Discussion of management or test interpretation with external provider(s): MRI shows likely acute compression fracture at L2.  No reported recent fall.  Has right flank pain  and her workup today shows that she has hypokalemia and likely cystitis. No fever, vomiting. Flank pain possibly secondary to compression fx.    No evidence of sepsis.  Urine culture pending and she has been given IV Rocephin  she has been given oral and IV potassium here  I have attempted to discharge, but patient lives with her sister who states she is unable to care for her at home   Attempted to ambulate pt here, but very unsteady even while using a walker, requiring assistance and only able to make few steps.    Consulted with neurosurgery, Meyran NP and discussed MRI findings.  Recommends TLSO brace.    Consulted [facility] hospitalist, Dr. Adefeso who agrees to admit.  TLSO brace ordered    Risk Prescription drug management. Decision regarding hospitalization.           Final Clinical Impression(s) / ED Diagnoses Final diagnoses:  Flank pain  Hypokalemia  Compression fracture of L2 vertebra, initial encounter [facility])    Rx / DC Orders ED Discharge Orders     None         Catherne Clubs, PA-C 05/27/24 1956    Deatra Face,  MD 05/28/24 [phone]

## 2024-05-28 ENCOUNTER — Other Ambulatory Visit: Payer: Self-pay

## 2024-05-28 DIAGNOSIS — S32020A Wedge compression fracture of second lumbar vertebra, initial encounter for closed fracture: Secondary | ICD-10-CM | POA: Diagnosis not present

## 2024-05-28 DIAGNOSIS — E876 Hypokalemia: Secondary | ICD-10-CM | POA: Diagnosis not present

## 2024-05-28 DIAGNOSIS — C9000 Multiple myeloma not having achieved remission: Secondary | ICD-10-CM

## 2024-05-28 LAB — CBC
HCT: 28.1 % — ABNORMAL LOW (ref 36.0–46.0)
Hemoglobin: 8.9 g/dL — ABNORMAL LOW (ref 12.0–15.0)
MCH: 32 pg (ref 26.0–34.0)
MCHC: 31.7 g/dL (ref 30.0–36.0)
MCV: 101.1 fL — ABNORMAL HIGH (ref 80.0–100.0)
Platelets: 139 10*3/uL — ABNORMAL LOW (ref 150–400)
RBC: 2.78 MIL/uL — ABNORMAL LOW (ref 3.87–5.11)
RDW: 17.4 % — ABNORMAL HIGH (ref 11.5–15.5)
WBC: 4 10*3/uL (ref 4.0–10.5)
nRBC: 0 % (ref 0.0–0.2)

## 2024-05-28 LAB — COMPREHENSIVE METABOLIC PANEL WITH GFR
ALT: 9 U/L (ref 0–44)
AST: 9 U/L — ABNORMAL LOW (ref 15–41)
Albumin: 2.5 g/dL — ABNORMAL LOW (ref 3.5–5.0)
Alkaline Phosphatase: 86 U/L (ref 38–126)
Anion gap: 8 (ref 5–15)
BUN: 14 mg/dL (ref 8–23)
CO2: 24 mmol/L (ref 22–32)
Calcium: 8.7 mg/dL — ABNORMAL LOW (ref 8.9–10.3)
Chloride: 106 mmol/L (ref 98–111)
Creatinine, Ser: 0.91 mg/dL (ref 0.44–1.00)
GFR, Estimated: >60 mL/min (ref >60)
Glucose, Bld: 134 mg/dL — ABNORMAL HIGH (ref 70–99)
Potassium: 3.8 mmol/L (ref 3.5–5.1)
Sodium: 138 mmol/L (ref 135–145)
Total Bilirubin: 1.3 mg/dL — ABNORMAL HIGH (ref 0.0–1.2)
Total Protein: 6 g/dL — ABNORMAL LOW (ref 6.5–8.1)

## 2024-05-28 LAB — GLUCOSE, CAPILLARY
Glucose-Capillary: 118 mg/dL — ABNORMAL HIGH (ref 70–99)
Glucose-Capillary: 160 mg/dL — ABNORMAL HIGH (ref 70–99)
Glucose-Capillary: 163 mg/dL — ABNORMAL HIGH (ref 70–99)
Glucose-Capillary: 149 mg/dL — ABNORMAL HIGH (ref 70–99)

## 2024-05-28 LAB — HEMOGLOBIN A1C
Hgb A1c MFr Bld: 7.4 % — ABNORMAL HIGH (ref 4.8–5.6)
Mean Plasma Glucose: 165.68 mg/dL

## 2024-05-28 LAB — MAGNESIUM: Magnesium: 2 mg/dL (ref 1.7–2.4)

## 2024-05-28 LAB — FOLATE: Folate: 8.3 ng/mL (ref >5.9)

## 2024-05-28 LAB — PHOSPHORUS: Phosphorus: 3.5 mg/dL (ref 2.5–4.6)

## 2024-05-28 LAB — VITAMIN B12: Vitamin B-12: 359 pg/mL (ref 180–914)

## 2024-05-28 MED ORDER — ANASTROZOLE 1 MG PO TABS
1.0000 mg | ORAL_TABLET | Freq: Every day | ORAL | Status: DC
  Administered 2024-05-28 – 2024-05-30 (×3): 1 mg via ORAL
  Filled 2024-05-28 (×4): qty 1

## 2024-05-28 MED ORDER — ATORVASTATIN CALCIUM 10 MG PO TABS
10.0000 mg | ORAL_TABLET | Freq: Every day | ORAL | Status: DC
  Administered 2024-05-28 – 2024-05-30 (×3): 10 mg via ORAL
  Filled 2024-05-28 (×3): qty 1

## 2024-05-28 MED ORDER — POLYETHYLENE GLYCOL 3350 17 G PO PACK
17.0000 g | PACK | Freq: Every day | ORAL | Status: DC
  Administered 2024-05-29 – 2024-05-30 (×2): 17 g via ORAL
  Filled 2024-05-28 (×2): qty 1

## 2024-05-28 MED ORDER — SODIUM CHLORIDE 0.9 % IV SOLN
1.0000 g | INTRAVENOUS | Status: DC
  Administered 2024-05-28 – 2024-05-29 (×2): 1 g via INTRAVENOUS
  Filled 2024-05-28 (×2): qty 10

## 2024-05-28 MED ORDER — FAMOTIDINE 20 MG PO TABS: 20.0000 mg | ORAL_TABLET | Freq: Every evening | ORAL | Status: DC | PRN

## 2024-05-28 MED ORDER — ASPIRIN 81 MG PO TBEC
81.0000 mg | DELAYED_RELEASE_TABLET | Freq: Every day | ORAL | Status: DC
  Administered 2024-05-28 – 2024-05-30 (×3): 81 mg via ORAL
  Filled 2024-05-28 (×3): qty 1

## 2024-05-28 MED ORDER — ASPIRIN 81 MG PO TABS: 81.0000 mg | ORAL_TABLET | Freq: Every day | ORAL | Status: DC

## 2024-05-28 NOTE — Hospital Course (Addendum)
 59 female with a history of diabetes mellitus type 2, retention, coronary disease, bilateral breast cancer, myeloma, CKD, cognitive impairment presenting with worsening low back pain.  History is obtained from review of the medical record, the patient's sister, and niece.  patient has a long history (1 to 2 years) of mild intermittent low back pain which was usually well-controlled with Tylenol  up to earlier this week.  Patient has been complaining of worsening right-sided lower back pain within the last 3 days and it appears that Tylenol  has not been controlling the pain that much.  However, she continued to ambulate without any assistive device until this morning when she had difficulty in being able to get up from bed due to pain, so EMS was activated and patient was sent to the ED for further evaluation and management.  She denies burning sensation or urination or any other irritative bladder symptoms.   ED Course:  In the emergency department, respiratory rate on arrival to the ED was 21/min, BP was 156/60, but other vital signs were within normal range.  Workup in the ED showed macrocytic anemia and thrombocytopenia.  Urinalysis was positive for nitrite and trace leukocytes with only 21-50 WBCs. MRI lumbar spine with and without IV contrast showed acute appearing compression fracture of the L2 vertebral body with severe central height loss, marrow edema, and enhancement. Grade 1 anterolisthesis of L4 on L5 contributes to moderate spinal canal stenosis at L4-L5. Severe right and moderate left neural foraminal narrowing at L5-S1. Empiric IV ceftriaxone  was given due to presumed UTI.  Potassium was replenished, morphine  and oxycodone were given, Protonix was also given.  Neurosurgery was consulted and recommended TLSO without any need for surgical intervention. Hospitalist was asked to admit patient for further evaluation and management.

## 2024-05-28 NOTE — Progress Notes (Signed)
 Physical Therapy Evaluation Patient Details Name: Brittany Archer MRN: [id number] DOB: 1941-01-22 Today's Date: 05/28/2024  History of Present Illness  MRI lumbar spine with and without IV contrast showed acute appearing compression fracture of the L2 vertebral body with severe central height loss, marrow edema, and enhancement. Grade 1 anterolisthesis of L4 on L5 contributes to moderate spinal canal stenosis at L4-L5. Severe right and moderate left neural foraminal narrowing at L5-S1  Clinical Impression  PT recently moved in with sister was I in mobility until recent compression fx.  Pt, family unsure what happened as pt did not fall.         If plan is discharge home, recommend the following: Two people to help with walking and/or transfers;Assistance with cooking/housework;Direct supervision/assist for medications management;Assist for transportation;Help with stairs or ramp for entrance   Can travel by private vehicle   No    Equipment Recommendations None recommended by PT (will need a rolling walker following SNF)  Recommendations for Other Services  OT consult    Functional Status Assessment Patient has had a recent decline in their functional status and demonstrates the ability to make significant improvements in function in a reasonable and predictable amount of time.     Precautions / Restrictions Precautions Precautions: Fall;Other (comment) Precaution/Restrictions Comments: TSLO when up Required Braces or Orthoses: Spinal Brace Spinal Brace: Thoracolumbosacral orthotic Restrictions Weight Bearing Restrictions Per Provider Order: No      Mobility  Bed Mobility Overal bed mobility: Needs Assistance Bed Mobility: Rolling Rolling: Max assist              Transfers                   General transfer comment: unable to come to sitting due to pain              Pertinent Vitals/Pain Pain Assessment Pain Assessment: Faces Faces Pain Scale:  Hurts even more (when working on bed mobility)            Extremity/Trunk Assessment        Lower Extremity Assessment Lower Extremity Assessment: Generalized weakness;LLE deficits/detail;RLE deficits/detail RLE Deficits / Details: 2/5 LLE Deficits / Details: 2+/5       Communication   Communication Communication: No apparent difficulties Factors Affecting Communication: Hearing impaired    Cognition Arousal: Alert Behavior During Therapy: WFL for tasks assessed/performed                             Following commands: Intact       Cueing Cueing Techniques: Verbal cues     General Comments      Exercises General Exercises - Lower Extremity Ankle Circles/Pumps: Both, 10 reps Quad Sets: AROM, Both, 10 reps Gluteal Sets: AROM, 10 reps Heel Slides: Both, AAROM, 10 reps Hip ABduction/ADduction: Both, AAROM, 10 reps   Assessment/Plan    PT Assessment Patient needs continued PT services  PT Problem List Decreased strength;Decreased activity tolerance;Decreased balance;Decreased mobility;Pain;Decreased knowledge of precautions       PT Treatment Interventions Gait training;Functional mobility training;Therapeutic exercise    PT Goals (Current goals can be found in the Care Plan section)  Acute Rehab PT Goals Patient Stated Goal: to walk again PT Goal Formulation: With patient Time For Goal Achievement: 06/01/24 Potential to Achieve Goals: Good    Frequency Min 4X/week        AM-PAC PT "6 Clicks" Mobility  Outcome Measure Help  needed turning from your back to your side while in a flat bed without using bedrails?: A Lot Help needed moving from lying on your back to sitting on the side of a flat bed without using bedrails?: Total Help needed moving to and from a bed to a chair (including a wheelchair)?: Total Help needed standing up from a chair using your arms (e.g., wheelchair or bedside chair)?: Total Help needed to walk in hospital room?:  Total Help needed climbing 3-5 steps with a railing? : Total 6 Click Score: 7    End of Session   Activity Tolerance: Patient limited by pain Patient left: in bed;with call bell/phone within reach;with family/visitor present Nurse Communication: Mobility status PT Visit Diagnosis: Other abnormalities of gait and mobility (R26.89);Muscle weakness (generalized) (M62.81);Difficulty in walking, not elsewhere classified (R26.2);Pain Pain - part of body:  (back)    Time: 9604-5409 PT Time Calculation (min) (ACUTE ONLY): 29 min   Charges:   PT Evaluation $PT Eval Low Complexity: 1 Low PT Treatments $Therapeutic Exercise: 8-22 mins PT General Charges $$ ACUTE PT VISIT: 1 Visit         Leodis Rainwater, PT CLT [phone]  05/28/2024, 10:21 AM

## 2024-05-28 NOTE — Progress Notes (Addendum)
 PROGRESS NOTE  Brittany Archer ZOX:[id number] DOB: 1941-10-12 DOA: 05/27/2024 PCP: Twylla Galen, MD  Brief History:  29 female with a history of diabetes mellitus type 2, retention, coronary disease, bilateral breast cancer, myeloma, CKD, cognitive impairment presenting with worsening low back pain.  History is obtained from review of the medical record, the patient's sister, and niece.  patient has a long history (1 to 2 years) of mild intermittent low back pain which was usually well-controlled with Tylenol  up to earlier this week.  Patient has been complaining of worsening right-sided lower back pain within the last 3 days and it appears that Tylenol  has not been controlling the pain that much.  However, she continued to ambulate without any assistive device until this morning when she had difficulty in being able to get up from bed due to pain, so EMS was activated and patient was sent to the ED for further evaluation and management.  She denies burning sensation or urination or any other irritative bladder symptoms.   ED Course:  In the emergency department, respiratory rate on arrival to the ED was 21/min, BP was 156/60, but other vital signs were within normal range.  Workup in the ED showed macrocytic anemia and thrombocytopenia.  Urinalysis was positive for nitrite and trace leukocytes with only 21-50 WBCs. MRI lumbar spine with and without IV contrast showed acute appearing compression fracture of the L2 vertebral body with severe central height loss, marrow edema, and enhancement. Grade 1 anterolisthesis of L4 on L5 contributes to moderate spinal canal stenosis at L4-L5. Severe right and moderate left neural foraminal narrowing at L5-S1. Empiric IV ceftriaxone  was given due to presumed UTI.  Potassium was replenished, morphine  and oxycodone were given, Protonix was also given.  Neurosurgery was consulted and recommended TLSO without any need for surgical  intervention. Hospitalist was asked to admit patient for further evaluation and management.     Assessment/Plan: Closed compression fracture of L2 lumbar vertebra  Continue oxycodone 5 mg every 4 hours as needed for moderate/severe pain Continue TLSO PT and OT eval and treat>> SNF - MRI L-spine as discussed above  Hypokalemia -Repleted  Pyuria -Continue ceftriaxone  pending culture data -CT renal--mildly atrophic kidneys.  No hydronephrosis.  Cholelithiasis.  Nonobstructive calculus in the right kidney.  Coronary artery disease -No chest pain presently -Continue aspirin  and statin  Myeloma -Follow-up Dr. Cheree Cords - As her myeloma gotten recently worse, Revlimid  was added back on 01/11/2024 --Continue Revlimid  10 mg 3 weeks on/1 week off and Velcade  every 2 weeks.  Bilateral breast cancer - Due to recent worsening of myeloma, definitive breast cancer management was put on back partner. --Continue anastrozole  daily    Macrocytic anemia - B12 359 - Folic acid  8.3 - Felt to be in part due to myelosuppression - Baseline hemoglobin 9-10        Family Communication:   niece at bedside 6/1  Consultants:  neurosurg on phone  Code Status:  FULL / DNR  DVT Prophylaxis:  [place] Heparin  /  Lovenox    Procedures: As Listed in Progress Note Above  Antibiotics: Ceftriaxone  5/31>>     Subjective: Patient denies fevers, chills, headache, chest pain, dyspnea, nausea, vomiting, diarrhea, abdominal pain, dysuria, hematuria, hematochezia, and melena.   Objective: Vitals:   05/27/24 2109 05/28/24 0206 05/28/24 0456 05/28/24 0836  BP:  134/61 135/62 (!) 161/61  Pulse:  (!) 57 (!) 57 61  Resp:  17 16 16  Temp:  97.8 F (36.6 C) 97.8 F (36.6 C)   TempSrc:  Oral Oral   SpO2: 100% 99% 98% 97%  Weight:      Height:        Intake/Output Summary (Last 24 hours) at 05/28/2024 1303 Last data filed at 05/27/2024 2300 Gross per 24 hour  Intake 120 ml  Output --  Net 120  ml   Weight change:  Exam:  General:  Pt is alert, follows commands appropriately, not in acute distress HEENT: No icterus, No thrush, No neck mass, [place]/AT Cardiovascular: RRR, S1/S2, no rubs, no gallops Respiratory: CTA bilaterally, no wheezing, no crackles, no rhonchi Abdomen: Soft/+BS, non tender, non distended, no guarding Extremities: No edema, No lymphangitis, No petechiae, No rashes, no synovitis   Data Reviewed: I have personally reviewed following labs and imaging studies Basic Metabolic Panel: Recent Labs  Lab 05/27/24 1008 05/28/24 0316  NA 137 138  K 2.9* 3.8  CL 106 106  CO2 24 24  GLUCOSE 176* 134*  BUN 10 14  CREATININE 0.91 0.91  CALCIUM 8.8* 8.7*  MG  --  2.0  PHOS  --  3.5   Liver Function Tests: Recent Labs  Lab 05/27/24 1008 05/28/24 0316  AST 11* 9*  ALT 9 9  ALKPHOS 97 86  BILITOT 1.5* 1.3*  PROT 6.4* 6.0*  ALBUMIN 2.9* 2.5*   Recent Labs  Lab 05/27/24 1008  LIPASE 24   No results for input(s): "AMMONIA" in the last 168 hours. Coagulation Profile: No results for input(s): "INR", "PROTIME" in the last 168 hours. CBC: Recent Labs  Lab 05/27/24 1008 05/28/24 0316  WBC 4.0 4.0  HGB 10.0* 8.9*  HCT 31.4* 28.1*  MCV 100.6* 101.1*  PLT 141* 139*   Cardiac Enzymes: No results for input(s): "CKTOTAL", "CKMB", "CKMBINDEX", "TROPONINI" in the last 168 hours. BNP: Invalid input(s): "POCBNP" CBG: Recent Labs  Lab 05/27/24 0946 05/27/24 2139 05/28/24 0720 05/28/24 1116  GLUCAP 176* 168* 118* 160*   HbA1C: Recent Labs    05/28/24 0316  HGBA1C 7.4*   Urine analysis:    Component Value Date/Time   COLORURINE YELLOW 05/27/2024 1121   APPEARANCEUR HAZY (A) 05/27/2024 1121   LABSPEC 1.012 05/27/2024 1121   PHURINE 7.0 05/27/2024 1121   GLUCOSEU NEGATIVE 05/27/2024 1121   HGBUR SMALL (A) 05/27/2024 1121   BILIRUBINUR NEGATIVE 05/27/2024 1121   KETONESUR NEGATIVE 05/27/2024 1121   PROTEINUR 30 (A) 05/27/2024 1121    UROBILINOGEN 0.2 09/11/2011 0005   NITRITE POSITIVE (A) 05/27/2024 1121   LEUKOCYTESUR TRACE (A) 05/27/2024 1121   Sepsis Labs: @LABRCNTIP (procalcitonin:4,lacticidven:4) )No results found for this or any previous visit (from the past 240 hours).   Scheduled Meds:  anastrozole   1 mg Oral Daily   aspirin  EC  81 mg Oral Daily   atorvastatin  10 mg Oral Daily   enoxaparin  (LOVENOX ) injection  40 mg Subcutaneous Q24H   insulin  aspart  0-9 Units Subcutaneous TID WC   Continuous Infusions:  cefTRIAXone  (ROCEPHIN )  IV 1 g (05/28/24 0842)    Procedures/Studies: MR Lumbar Spine W Wo Contrast Result Date: 05/27/2024 EXAM: MR Lumbar Spine with and without intravenous contrast. 05/27/2024 03:15:57 PM TECHNIQUE: Multiplanar multisequence MRI of the lumbar spine was performed with and without the administration of intravenous contrast. COMPARISON: Lumbar spine CT dated 05/27/2024. CLINICAL HISTORY: Low back pain, cancer suspected; Back pain history of multiple myeloma. F/u L2 fracture seen on CT today. FINDINGS: BONES AND ALIGNMENT: Conventional lumbosacral anatomy is assumed  with 5 non-rib-bearing, lumbar-type vertebral bodies. Compression fracture of the L2 vertebral body with severe central height loss, marrow edema, and enhancement. Grade 1 anterolisthesis of L4 on L5. SPINAL CORD: The conus terminates at L2. SOFT TISSUES: Partially imaged cystic lesion arising from the lower pole of the left kidney. Attention on same day abdominal CT. Moderate fatty atrophy of the paraspinal muscles. L1-L2: Small disc bulge. L2-L3: Facet arthropathy. L3-L4: Left eccentric disc bulge and mild bilateral facet arthropathy contribute to moderate left neural foraminal narrowing. L4-L5: Anterolisthesis with uncovered disc and moderate bilateral facet arthropathy with periarticular edema and joint effusions contribute to moderate spinal canal stenosis and moderate left neural foraminal narrowing. L5-S1: Disc bulge and right  greater than left facet arthropathy result in severe right and moderate left neural foraminal narrowing. IMPRESSION: 1. Acute appearing compression fracture of the L2 vertebral body with severe central height loss, marrow edema, and enhancement. 2. Grade 1 anterolisthesis of L4 on L5 contributes to moderate spinal canal stenosis at L4-L5. 3. Severe right and moderate left neural foraminal narrowing at L5-S1. Electronically signed by: Audra Blend MD 05/27/2024 04:22 PM EDT RP Workstation: [id number]   CT Renal Stone Study Result Date: 05/27/2024 CLINICAL DATA:  Abdominal and flank pain, history of multiple myeloma * Tracking Code: BO * EXAM: CT ABDOMEN AND PELVIS WITHOUT CONTRAST CT LUMBAR SPINE WITHOUT CONTRAST TECHNIQUE: Multidetector CT imaging of the abdomen and pelvis was performed following the standard protocol without IV contrast. Multidetector CT imaging of the lumbar spine was performed following the standard protocol without IV contrast. RADIATION DOSE REDUCTION: This exam was performed according to the departmental dose-optimization program which includes automated exposure control, adjustment of the mA and/or kV according to patient size and/or use of iterative reconstruction technique. COMPARISON:  PET-CT, 12/16/2023 FINDINGS: CT ABDOMEN PELVIS FINDINGS Lower chest: No acute findings. Bandlike scarring or atelectasis of the lung bases. Coronary artery calcifications. Small hiatal hernia. Hepatobiliary: No solid liver abnormality is seen. Faintly calcified gallstones (series 6, image 56). No biliary ductal dilatation. Pneumobilia (series 3, image 19). Pancreas: Unremarkable. No pancreatic ductal dilatation or surrounding inflammatory changes. Spleen: Normal in size without significant abnormality. Adrenals/Urinary Tract: Adrenal glands are unremarkable. Mildly atrophic kidneys. Punctuate nonobstructive calculus of the inferior pole of the right kidney (series 6, image 62). Bladder is unremarkable.  Stomach/Bowel: Stomach is within normal limits. Appendix appears normal. No evidence of bowel wall thickening, distention, or inflammatory changes. Descending and sigmoid diverticulosis. Vascular/Lymphatic: Aortic atherosclerosis. No enlarged abdominal or pelvic lymph nodes. Reproductive: Status post hysterectomy. Other: No abdominal wall hernia or abnormality. No ascites. Musculoskeletal: No acute osseous findings. CT LUMBAR SPINE FINDINGS Alignment: Normal lumbar lordosis. Vertebral bodies: Increased superior and inferior endplate deformities of L2, of uncertain acuity (series 503, image 44). No other evidence of fracture. No suspicious osseous lesions. Disc spaces: Mild multilevel disc space height loss and osteophytosis. Mild facet degenerative change of the lower lumbar levels. Paraspinous soft tissues: Unremarkable. IMPRESSION: 1. Increased superior and inferior endplate deformities of L2, of uncertain acuity. Correlate for point tenderness. MRI may be used to assess for pathologic fracture and fracture acuity. 2. No other CT evidence of acute abnormality of the abdomen or pelvis. 3. Punctuate nonobstructive calculus of the inferior pole of the right kidney. 4. Cholelithiasis without evidence of acute cholecystitis. 5. Descending and sigmoid diverticulosis without evidence of acute diverticulitis. 6. Coronary artery disease. Aortic Atherosclerosis (ICD10-I70.0). Electronically Signed   By: Fredricka Jenny M.D.   On: 05/27/2024 11:20  CT L-SPINE NO CHARGE Result Date: 05/27/2024 CLINICAL DATA:  Abdominal and flank pain, history of multiple myeloma * Tracking Code: BO * EXAM: CT ABDOMEN AND PELVIS WITHOUT CONTRAST CT LUMBAR SPINE WITHOUT CONTRAST TECHNIQUE: Multidetector CT imaging of the abdomen and pelvis was performed following the standard protocol without IV contrast. Multidetector CT imaging of the lumbar spine was performed following the standard protocol without IV contrast. RADIATION DOSE REDUCTION:  This exam was performed according to the departmental dose-optimization program which includes automated exposure control, adjustment of the mA and/or kV according to patient size and/or use of iterative reconstruction technique. COMPARISON:  PET-CT, 12/16/2023 FINDINGS: CT ABDOMEN PELVIS FINDINGS Lower chest: No acute findings. Bandlike scarring or atelectasis of the lung bases. Coronary artery calcifications. Small hiatal hernia. Hepatobiliary: No solid liver abnormality is seen. Faintly calcified gallstones (series 6, image 56). No biliary ductal dilatation. Pneumobilia (series 3, image 19). Pancreas: Unremarkable. No pancreatic ductal dilatation or surrounding inflammatory changes. Spleen: Normal in size without significant abnormality. Adrenals/Urinary Tract: Adrenal glands are unremarkable. Mildly atrophic kidneys. Punctuate nonobstructive calculus of the inferior pole of the right kidney (series 6, image 62). Bladder is unremarkable. Stomach/Bowel: Stomach is within normal limits. Appendix appears normal. No evidence of bowel wall thickening, distention, or inflammatory changes. Descending and sigmoid diverticulosis. Vascular/Lymphatic: Aortic atherosclerosis. No enlarged abdominal or pelvic lymph nodes. Reproductive: Status post hysterectomy. Other: No abdominal wall hernia or abnormality. No ascites. Musculoskeletal: No acute osseous findings. CT LUMBAR SPINE FINDINGS Alignment: Normal lumbar lordosis. Vertebral bodies: Increased superior and inferior endplate deformities of L2, of uncertain acuity (series 503, image 44). No other evidence of fracture. No suspicious osseous lesions. Disc spaces: Mild multilevel disc space height loss and osteophytosis. Mild facet degenerative change of the lower lumbar levels. Paraspinous soft tissues: Unremarkable. IMPRESSION: 1. Increased superior and inferior endplate deformities of L2, of uncertain acuity. Correlate for point tenderness. MRI may be used to assess for  pathologic fracture and fracture acuity. 2. No other CT evidence of acute abnormality of the abdomen or pelvis. 3. Punctuate nonobstructive calculus of the inferior pole of the right kidney. 4. Cholelithiasis without evidence of acute cholecystitis. 5. Descending and sigmoid diverticulosis without evidence of acute diverticulitis. 6. Coronary artery disease. Aortic Atherosclerosis (ICD10-I70.0). Electronically Signed   By: Fredricka Jenny M.D.   On: 05/27/2024 11:20    Demaris Fillers, DO  [facility]  If 7PM-7AM, please contact night-coverage [email or web address] Password [email or web address] 05/28/2024, 1:03 PM   LOS: 1 day

## 2024-05-28 NOTE — Plan of Care (Signed)
 Pain controlled with oral narcotic med and with wearing of TLSO brace.  Problem: Education: Goal: Knowledge of General Education information will improve Description: Including pain rating scale, medication(s)/side effects and non-pharmacologic comfort measures Outcome: Progressing   Problem: Health Behavior/Discharge Planning: Goal: Ability to manage health-related needs will improve Outcome: Progressing   Problem: Clinical Measurements: Goal: Ability to maintain clinical measurements within normal limits will improve Outcome: Progressing Goal: Will remain free from infection Outcome: Progressing Goal: Diagnostic test results will improve Outcome: Progressing Goal: Respiratory complications will improve Outcome: Progressing Goal: Cardiovascular complication will be avoided Outcome: Progressing   Problem: Activity: Goal: Risk for activity intolerance will decrease Outcome: Progressing   Problem: Nutrition: Goal: Adequate nutrition will be maintained Outcome: Progressing   Problem: Coping: Goal: Level of anxiety will decrease Outcome: Progressing   Problem: Elimination: Goal: Will not experience complications related to bowel motility Outcome: Progressing Goal: Will not experience complications related to urinary retention Outcome: Progressing   Problem: Pain Managment: Goal: General experience of comfort will improve and/or be controlled Outcome: Progressing   Problem: Safety: Goal: Ability to remain free from injury will improve Outcome: Progressing   Problem: Skin Integrity: Goal: Risk for impaired skin integrity will decrease Outcome: Progressing   Problem: Education: Goal: Ability to describe self-care measures that may prevent or decrease complications (Diabetes Survival Skills Education) will improve Outcome: Progressing Goal: Individualized Educational Video(s) Outcome: Progressing   Problem: Coping: Goal: Ability to adjust to condition or change in  health will improve Outcome: Progressing   Problem: Fluid Volume: Goal: Ability to maintain a balanced intake and output will improve Outcome: Progressing   Problem: Health Behavior/Discharge Planning: Goal: Ability to identify and utilize available resources and services will improve Outcome: Progressing Goal: Ability to manage health-related needs will improve Outcome: Progressing   Problem: Metabolic: Goal: Ability to maintain appropriate glucose levels will improve Outcome: Progressing   Problem: Nutritional: Goal: Maintenance of adequate nutrition will improve Outcome: Progressing Goal: Progress toward achieving an optimal weight will improve Outcome: Progressing   Problem: Skin Integrity: Goal: Risk for impaired skin integrity will decrease Outcome: Progressing   Problem: Tissue Perfusion: Goal: Adequacy of tissue perfusion will improve Outcome: Progressing

## 2024-05-28 NOTE — Plan of Care (Signed)
  Problem: Acute Rehab PT Goals(only PT should resolve) Goal: Pt will Roll Supine to Side Flowsheets (Taken 05/28/2024 1017) Pt will Roll Supine to Side: with min assist Goal: Pt Will Go Supine/Side To Sit Flowsheets (Taken 05/28/2024 1018) Pt will go Supine/Side to Sit: with moderate assist Goal: Pt Will Go Sit To Supine/Side Flowsheets (Taken 05/28/2024 1018) Pt will go Sit to Supine/Side: with moderate assist Goal: Patient Will Transfer Sit To/From Stand Flowsheets (Taken 05/28/2024 1018) Patient will transfer sit to/from stand: with moderate assist Goal: Pt Will Ambulate Flowsheets (Taken 05/28/2024 1018) Pt will Ambulate:  10 feet  with moderate assist  with rolling walker

## 2024-05-28 NOTE — TOC Initial Note (Signed)
 Transition of Care [facility]) - Initial/Assessment Note    Patient Details  Name: Brittany Archer MRN: [id number] Date of Birth: 1941-07-02  [facility]) CM/SW Contact:    Katrine Parody, LCSW Phone Number: 05/28/2024, 12:51 PM  Clinical Narrative:                 Pt lives with sister and brother.  PT recommending SNF.  CSW visited her at bedside.  She listened and responded as if she understood, nodding her head.  Explained the hospital is not a place for daily PT . Pt wanted to decide tomorrow if we should request Auth so she can go to SNF.  CSW asked her if I can start requesting bed offers in case her decision is yes tomorrow, she agreed, prefers facilities close by. PASSR completed, FL2 started and bed offers sent. TOC should follow up with pt/Family Mon and request auth.  TOC to follow.   Expected Discharge Plan: Skilled Nursing Facility Barriers to Discharge: Continued Medical Work up   Patient Goals and CMS Choice            Expected Discharge Plan and Services                                              Prior Living Arrangements/Services                       Activities of Daily Living   ADL Screening (condition at time of admission) Independently performs ADLs?: No Does the patient have a NEW difficulty with bathing/dressing/toileting/self-feeding that is expected to last >3 days?: Yes (Initiates electronic notice to provider for possible OT consult) Does the patient have a NEW difficulty with getting in/out of bed, walking, or climbing stairs that is expected to last >3 days?: Yes (Initiates electronic notice to provider for possible PT consult) Does the patient have a NEW difficulty with communication that is expected to last >3 days?: No Is the patient deaf or have difficulty hearing?: No Does the patient have difficulty seeing, even when wearing glasses/contacts?: No Does the patient have difficulty concentrating, remembering, or  making decisions?: No  Permission Sought/Granted                  Emotional Assessment              Admission diagnosis:  Hypokalemia [E87.6] Flank pain [R10.9] Cystitis [N30.90] Closed compression fracture of L2 lumbar vertebra, initial encounter (HCC) [S32.020A] Compression fracture of L2 vertebra, initial encounter (HCC) [S32.020A] Patient Active Problem List   Diagnosis Date Noted   Closed compression fracture of L2 lumbar vertebra, initial encounter (HCC) 05/27/2024   Type 2 diabetes mellitus with hyperglycemia (HCC) 05/27/2024   GERD (gastroesophageal reflux disease) 05/27/2024   Genetic testing 05/04/2024   Breast cancer of upper-inner quadrant of left female breast (HCC) 03/30/2024   Bilateral breast cancer (HCC) 03/30/2024   Macrocytic anemia 03/14/2022   Hypoglycemia 03/14/2022   CKD (chronic kidney disease), stage III B 03/14/2022   UTI (urinary tract infection) 03/14/2022   Dementia without behavioral disturbance (HCC) 03/14/2022   Open mouth wound 11/15/2020   Encounter for peripheral line placement 03/25/2020   PICC (peripherally inserted central catheter) in place 03/18/2020   Leukopenia 03/18/2020   Osteomyelitis, jaw acute    Osteonecrosis (HCC)  Confusion 02/20/2020   Facial cellulitis 02/19/2020   Type 2 diabetes mellitus (HCC)    Coronary artery disease    Hypertension    Stage 3b chronic kidney disease (HCC)    Hypomagnesemia    Hypophosphatemia    Hypokalemia 01/03/2020   CKD (chronic kidney disease) 05/01/2019   B12 deficiency 02/09/2018   Multiple myeloma not having achieved remission (HCC) 01/20/2018   Iron deficiency anemia 11/25/2017   PCP:  Twylla Galen, MD Pharmacy:   [facility] - [place], [place] - [address] [place] [address] Phone: [phone] Fax: [phone]  [facility] [phone] - [place], [place] - [address] AT [facility] [address] [place] [address] Phone: [phone] Fax: [phone]  [facility] - [place], [place] - [address] [place] [address] Phone: [phone] Fax: [phone]     Social Drivers of Health (SDOH) Social History: SDOH Screenings   Food Insecurity: No Food Insecurity (05/27/2024)  Housing: Low Risk  (05/27/2024)  Transportation Needs: No Transportation Needs (05/27/2024)  Utilities: Not At Risk (05/27/2024)  Alcohol Screen: Low Risk  (12/09/2020)  Depression (PHQ2-9): Low Risk  (03/18/2020)  Financial Resource Strain: Low Risk  (12/09/2020)  Physical Activity: Inactive (12/09/2020)  Social Connections: Moderately Isolated (05/27/2024)  Stress: No Stress Concern Present (12/09/2020)  Tobacco Use: Low Risk  (05/27/2024)   SDOH Interventions:     Readmission Risk Interventions     No data to display

## 2024-05-29 DIAGNOSIS — N3090 Cystitis, unspecified without hematuria: Secondary | ICD-10-CM

## 2024-05-29 DIAGNOSIS — S32020A Wedge compression fracture of second lumbar vertebra, initial encounter for closed fracture: Secondary | ICD-10-CM | POA: Diagnosis not present

## 2024-05-29 DIAGNOSIS — E876 Hypokalemia: Secondary | ICD-10-CM | POA: Diagnosis not present

## 2024-05-29 LAB — GLUCOSE, CAPILLARY
Glucose-Capillary: 165 mg/dL — ABNORMAL HIGH (ref 70–99)
Glucose-Capillary: 150 mg/dL — ABNORMAL HIGH (ref 70–99)
Glucose-Capillary: 170 mg/dL — ABNORMAL HIGH (ref 70–99)
Glucose-Capillary: 157 mg/dL — ABNORMAL HIGH (ref 70–99)

## 2024-05-29 LAB — URINE CULTURE: Culture: >=100000 — AB

## 2024-05-29 MED ORDER — CEPHALEXIN 500 MG PO CAPS
500.0000 mg | ORAL_CAPSULE | Freq: Three times a day (TID) | ORAL | Status: DC
  Administered 2024-05-29 – 2024-05-30 (×3): 500 mg via ORAL
  Filled 2024-05-29 (×3): qty 1

## 2024-05-29 MED ORDER — PANTOPRAZOLE SODIUM 40 MG PO TBEC
40.0000 mg | DELAYED_RELEASE_TABLET | Freq: Every day | ORAL | Status: DC
  Administered 2024-05-29 – 2024-05-30 (×2): 40 mg via ORAL
  Filled 2024-05-29 (×2): qty 1

## 2024-05-29 MED ORDER — ACETAMINOPHEN 500 MG PO TABS
1000.0000 mg | ORAL_TABLET | Freq: Three times a day (TID) | ORAL | Status: DC
  Administered 2024-05-29 – 2024-05-30 (×3): 1000 mg via ORAL
  Filled 2024-05-29 (×3): qty 2

## 2024-05-29 MED ORDER — SODIUM CHLORIDE 0.9 % IV SOLN: INTRAVENOUS | Status: AC | PRN

## 2024-05-29 MED ORDER — PANTOPRAZOLE SODIUM 40 MG PO TBEC: 40.0000 mg | DELAYED_RELEASE_TABLET | Freq: Every day | ORAL | Status: DC

## 2024-05-29 MED ORDER — SIMETHICONE 80 MG PO CHEW
80.0000 mg | CHEWABLE_TABLET | Freq: Four times a day (QID) | ORAL | Status: DC
  Administered 2024-05-29 – 2024-05-30 (×4): 80 mg via ORAL
  Filled 2024-05-29 (×4): qty 1

## 2024-05-29 MED ORDER — ACETAMINOPHEN 500 MG PO TABS: 1000.0000 mg | ORAL_TABLET | Freq: Three times a day (TID) | ORAL | Status: DC

## 2024-05-29 MED ORDER — OXYCODONE HCL 5 MG PO TABS
5.0000 mg | ORAL_TABLET | ORAL | 0 refills | Status: DC | PRN
Start: 2024-05-29 — End: 2024-07-14

## 2024-05-29 NOTE — Plan of Care (Signed)
   Problem: Education: Goal: Knowledge of General Education information will improve Description Including pain rating scale, medication(s)/side effects and non-pharmacologic comfort measures Outcome: Progressing   Problem: Health Behavior/Discharge Planning: Goal: Ability to manage health-related needs will improve Outcome: Progressing

## 2024-05-29 NOTE — Progress Notes (Signed)
 PROGRESS NOTE  Brittany Archer ZOX:[id number] DOB: 1941/06/28 DOA: 05/27/2024 PCP: Twylla Galen, MD  Brief History:  62 female with a history of diabetes mellitus type 2, retention, coronary disease, bilateral breast cancer, myeloma, CKD, cognitive impairment presenting with worsening low back pain.  History is obtained from review of the medical record, the patient's sister, and niece.  patient has a long history (1 to 2 years) of mild intermittent low back pain which was usually well-controlled with Tylenol  up to earlier this week.  Patient has been complaining of worsening right-sided lower back pain within the last 3 days and it appears that Tylenol  has not been controlling the pain that much.  However, she continued to ambulate without any assistive device until this morning when she had difficulty in being able to get up from bed due to pain, so EMS was activated and patient was sent to the ED for further evaluation and management.  She denies burning sensation or urination or any other irritative bladder symptoms.   ED Course:  In the emergency department, respiratory rate on arrival to the ED was 21/min, BP was 156/60, but other vital signs were within normal range.  Workup in the ED showed macrocytic anemia and thrombocytopenia.  Urinalysis was positive for nitrite and trace leukocytes with only 21-50 WBCs. MRI lumbar spine with and without IV contrast showed acute appearing compression fracture of the L2 vertebral body with severe central height loss, marrow edema, and enhancement. Grade 1 anterolisthesis of L4 on L5 contributes to moderate spinal canal stenosis at L4-L5. Severe right and moderate left neural foraminal narrowing at L5-S1. Empiric IV ceftriaxone  was given due to presumed UTI.  Potassium was replenished, morphine  and oxycodone were given, Protonix was also given.  Neurosurgery was consulted and recommended TLSO without any need for surgical  intervention. Hospitalist was asked to admit patient for further evaluation and management.     Assessment/Plan:  Closed compression fracture of L2 lumbar vertebra  Continue oxycodone 5 mg every 4 hours as needed for moderate/severe pain Continue TLSO PT and OT eval and treat>> SNF - MRI L-spine as discussed above - oxycodone prn pain   Hypokalemia -Repleted   UTI--Klebsiella -initially ceftriaxone  pending culture data -CT renal--mildly atrophic kidneys.  No hydronephrosis.  Cholelithiasis.  Nonobstructive calculus in the right kidney. - transition to cephalexin    Coronary artery disease -No chest pain presently -Continue aspirin  and statin   Myeloma -Follow-up Dr. Cheree Cords - As her myeloma gotten recently worse, Revlimid  was added back on 01/11/2024 --Continue Revlimid  10 mg 3 weeks on/1 week off and Velcade  every 2 weeks.   Bilateral breast cancer - Due to recent worsening of myeloma, definitive breast cancer management was put on back partner. --Continue anastrozole  daily     Macrocytic anemia - B12 359 - Folic acid  8.3 - Felt to be in part due to myelosuppression - Baseline hemoglobin 9-10               Family Communication:   sister at bedside 6/2   Consultants:  neurosurg on phone   Code Status:  FULL    DVT Prophylaxis:  [place] Lovenox      Procedures: As Listed in Progress Note Above   Antibiotics: Ceftriaxone  5/31>>6/2 Cephalexin  6/3>>       Subjective: Pt states opioids are helping her back pain.  Denies f/c, cp, n/v/d, abd pain  Objective: Vitals:   05/28/24 1503 05/28/24 1959 05/29/24 0440  05/29/24 1508  BP: (!) 160/75 (!) 135/53 (!) 151/66 (!) 163/60  Pulse: 68 71 60 62  Resp: 16 20 18 17   Temp: 98.7 F (37.1 C) 98.3 F (36.8 C) 98 F (36.7 C) 98.6 F (37 C)  TempSrc: Oral   Oral  SpO2: 100% 100% 98% 97%  Weight:      Height:        Intake/Output Summary (Last 24 hours) at 05/29/2024 1625 Last data filed at 05/29/2024  0600 Gross per 24 hour  Intake 220 ml  Output 200 ml  Net 20 ml   Weight change:  Exam:  General:  Pt is alert, follows commands appropriately, not in acute distress HEENT: No icterus, No thrush, No neck mass, [place]/AT Cardiovascular: RRR, S1/S2, no rubs, no gallops Respiratory: CTA bilaterally, no wheezing, no crackles, no rhonchi Abdomen: Soft/+BS, non tender, non distended, no guarding Extremities: No edema, No lymphangitis, No petechiae, No rashes, no synovitis   Data Reviewed: I have personally reviewed following labs and imaging studies Basic Metabolic Panel: Recent Labs  Lab 05/27/24 1008 05/28/24 0316  NA 137 138  K 2.9* 3.8  CL 106 106  CO2 24 24  GLUCOSE 176* 134*  BUN 10 14  CREATININE 0.91 0.91  CALCIUM 8.8* 8.7*  MG  --  2.0  PHOS  --  3.5   Liver Function Tests: Recent Labs  Lab 05/27/24 1008 05/28/24 0316  AST 11* 9*  ALT 9 9  ALKPHOS 97 86  BILITOT 1.5* 1.3*  PROT 6.4* 6.0*  ALBUMIN 2.9* 2.5*   Recent Labs  Lab 05/27/24 1008  LIPASE 24   No results for input(s): "AMMONIA" in the last 168 hours. Coagulation Profile: No results for input(s): "INR", "PROTIME" in the last 168 hours. CBC: Recent Labs  Lab 05/27/24 1008 05/28/24 0316  WBC 4.0 4.0  HGB 10.0* 8.9*  HCT 31.4* 28.1*  MCV 100.6* 101.1*  PLT 141* 139*   Cardiac Enzymes: No results for input(s): "CKTOTAL", "CKMB", "CKMBINDEX", "TROPONINI" in the last 168 hours. BNP: Invalid input(s): "POCBNP" CBG: Recent Labs  Lab 05/28/24 1116 05/28/24 1612 05/28/24 2043 05/29/24 0722 05/29/24 1110  GLUCAP 160* 163* 149* 165* 150*   HbA1C: Recent Labs    05/28/24 0316  HGBA1C 7.4*   Urine analysis:    Component Value Date/Time   COLORURINE YELLOW 05/27/2024 1121   APPEARANCEUR HAZY (A) 05/27/2024 1121   LABSPEC 1.012 05/27/2024 1121   PHURINE 7.0 05/27/2024 1121   GLUCOSEU NEGATIVE 05/27/2024 1121   HGBUR SMALL (A) 05/27/2024 1121   BILIRUBINUR NEGATIVE 05/27/2024 1121    KETONESUR NEGATIVE 05/27/2024 1121   PROTEINUR 30 (A) 05/27/2024 1121   UROBILINOGEN 0.2 09/11/2011 0005   NITRITE POSITIVE (A) 05/27/2024 1121   LEUKOCYTESUR TRACE (A) 05/27/2024 1121   Sepsis Labs: @LABRCNTIP (procalcitonin:4,lacticidven:4) ) Recent Results (from the past 240 hours)  Urine Culture     Status: Abnormal   Collection Time: 05/27/24 11:21 AM   Specimen: Urine, Clean Catch  Result Value Ref Range Status   Specimen Description   Final    URINE, CLEAN CATCH Performed at [facility], [address] [place], [place] [address]    Special Requests   Final    NONE Performed at [facility], [address], [place], [place] [address]    Culture >=100,000 COLONIES/mL KLEBSIELLA PNEUMONIAE (A)  Final   Report Status 05/29/2024 FINAL  Final   Organism ID, Bacteria KLEBSIELLA PNEUMONIAE (A)  Final      Susceptibility  Klebsiella pneumoniae - MIC*    AMPICILLIN  >=32 RESISTANT Resistant     CEFAZOLIN <=4 SENSITIVE Sensitive     CEFEPIME <=0.12 SENSITIVE Sensitive     CEFTRIAXONE  <=0.25 SENSITIVE Sensitive     CIPROFLOXACIN <=0.25 SENSITIVE Sensitive     GENTAMICIN <=1 SENSITIVE Sensitive     IMIPENEM <=0.25 SENSITIVE Sensitive     NITROFURANTOIN 64 INTERMEDIATE Intermediate     TRIMETH /SULFA  <=20 SENSITIVE Sensitive     AMPICILLIN /SULBACTAM 4 SENSITIVE Sensitive     PIP/TAZO <=4 SENSITIVE Sensitive ug/mL    * >=100,000 COLONIES/mL KLEBSIELLA PNEUMONIAE     Scheduled Meds:  acetaminophen   1,000 mg Oral Q8H   anastrozole   1 mg Oral Daily   aspirin  EC  81 mg Oral Daily   atorvastatin  10 mg Oral Daily   cephALEXin   500 mg Oral Q8H   enoxaparin  (LOVENOX ) injection  40 mg Subcutaneous Q24H   insulin  aspart  0-9 Units Subcutaneous TID WC   pantoprazole  40 mg Oral Daily   polyethylene glycol  17 g Oral Daily   Continuous Infusions:  sodium chloride  10 mL/hr at 05/29/24 0908    Procedures/Studies: MR Lumbar Spine W Wo Contrast Result Date: 05/27/2024 EXAM: MR Lumbar  Spine with and without intravenous contrast. 05/27/2024 03:15:57 PM TECHNIQUE: Multiplanar multisequence MRI of the lumbar spine was performed with and without the administration of intravenous contrast. COMPARISON: Lumbar spine CT dated 05/27/2024. CLINICAL HISTORY: Low back pain, cancer suspected; Back pain history of multiple myeloma. F/u L2 fracture seen on CT today. FINDINGS: BONES AND ALIGNMENT: Conventional lumbosacral anatomy is assumed with 5 non-rib-bearing, lumbar-type vertebral bodies. Compression fracture of the L2 vertebral body with severe central height loss, marrow edema, and enhancement. Grade 1 anterolisthesis of L4 on L5. SPINAL CORD: The conus terminates at L2. SOFT TISSUES: Partially imaged cystic lesion arising from the lower pole of the left kidney. Attention on same day abdominal CT. Moderate fatty atrophy of the paraspinal muscles. L1-L2: Small disc bulge. L2-L3: Facet arthropathy. L3-L4: Left eccentric disc bulge and mild bilateral facet arthropathy contribute to moderate left neural foraminal narrowing. L4-L5: Anterolisthesis with uncovered disc and moderate bilateral facet arthropathy with periarticular edema and joint effusions contribute to moderate spinal canal stenosis and moderate left neural foraminal narrowing. L5-S1: Disc bulge and right greater than left facet arthropathy result in severe right and moderate left neural foraminal narrowing. IMPRESSION: 1. Acute appearing compression fracture of the L2 vertebral body with severe central height loss, marrow edema, and enhancement. 2. Grade 1 anterolisthesis of L4 on L5 contributes to moderate spinal canal stenosis at L4-L5. 3. Severe right and moderate left neural foraminal narrowing at L5-S1. Electronically signed by: Audra Blend MD 05/27/2024 04:22 PM EDT RP Workstation: [id number]   CT Renal Stone Study Result Date: 05/27/2024 CLINICAL DATA:  Abdominal and flank pain, history of multiple myeloma * Tracking Code: BO * EXAM:  CT ABDOMEN AND PELVIS WITHOUT CONTRAST CT LUMBAR SPINE WITHOUT CONTRAST TECHNIQUE: Multidetector CT imaging of the abdomen and pelvis was performed following the standard protocol without IV contrast. Multidetector CT imaging of the lumbar spine was performed following the standard protocol without IV contrast. RADIATION DOSE REDUCTION: This exam was performed according to the departmental dose-optimization program which includes automated exposure control, adjustment of the mA and/or kV according to patient size and/or use of iterative reconstruction technique. COMPARISON:  PET-CT, 12/16/2023 FINDINGS: CT ABDOMEN PELVIS FINDINGS Lower chest: No acute findings. Bandlike scarring or atelectasis of the lung bases. Coronary artery  calcifications. Small hiatal hernia. Hepatobiliary: No solid liver abnormality is seen. Faintly calcified gallstones (series 6, image 56). No biliary ductal dilatation. Pneumobilia (series 3, image 19). Pancreas: Unremarkable. No pancreatic ductal dilatation or surrounding inflammatory changes. Spleen: Normal in size without significant abnormality. Adrenals/Urinary Tract: Adrenal glands are unremarkable. Mildly atrophic kidneys. Punctuate nonobstructive calculus of the inferior pole of the right kidney (series 6, image 62). Bladder is unremarkable. Stomach/Bowel: Stomach is within normal limits. Appendix appears normal. No evidence of bowel wall thickening, distention, or inflammatory changes. Descending and sigmoid diverticulosis. Vascular/Lymphatic: Aortic atherosclerosis. No enlarged abdominal or pelvic lymph nodes. Reproductive: Status post hysterectomy. Other: No abdominal wall hernia or abnormality. No ascites. Musculoskeletal: No acute osseous findings. CT LUMBAR SPINE FINDINGS Alignment: Normal lumbar lordosis. Vertebral bodies: Increased superior and inferior endplate deformities of L2, of uncertain acuity (series 503, image 44). No other evidence of fracture. No suspicious osseous  lesions. Disc spaces: Mild multilevel disc space height loss and osteophytosis. Mild facet degenerative change of the lower lumbar levels. Paraspinous soft tissues: Unremarkable. IMPRESSION: 1. Increased superior and inferior endplate deformities of L2, of uncertain acuity. Correlate for point tenderness. MRI may be used to assess for pathologic fracture and fracture acuity. 2. No other CT evidence of acute abnormality of the abdomen or pelvis. 3. Punctuate nonobstructive calculus of the inferior pole of the right kidney. 4. Cholelithiasis without evidence of acute cholecystitis. 5. Descending and sigmoid diverticulosis without evidence of acute diverticulitis. 6. Coronary artery disease. Aortic Atherosclerosis (ICD10-I70.0). Electronically Signed   By: Fredricka Jenny M.D.   On: 05/27/2024 11:20   CT L-SPINE NO CHARGE Result Date: 05/27/2024 CLINICAL DATA:  Abdominal and flank pain, history of multiple myeloma * Tracking Code: BO * EXAM: CT ABDOMEN AND PELVIS WITHOUT CONTRAST CT LUMBAR SPINE WITHOUT CONTRAST TECHNIQUE: Multidetector CT imaging of the abdomen and pelvis was performed following the standard protocol without IV contrast. Multidetector CT imaging of the lumbar spine was performed following the standard protocol without IV contrast. RADIATION DOSE REDUCTION: This exam was performed according to the departmental dose-optimization program which includes automated exposure control, adjustment of the mA and/or kV according to patient size and/or use of iterative reconstruction technique. COMPARISON:  PET-CT, 12/16/2023 FINDINGS: CT ABDOMEN PELVIS FINDINGS Lower chest: No acute findings. Bandlike scarring or atelectasis of the lung bases. Coronary artery calcifications. Small hiatal hernia. Hepatobiliary: No solid liver abnormality is seen. Faintly calcified gallstones (series 6, image 56). No biliary ductal dilatation. Pneumobilia (series 3, image 19). Pancreas: Unremarkable. No pancreatic ductal  dilatation or surrounding inflammatory changes. Spleen: Normal in size without significant abnormality. Adrenals/Urinary Tract: Adrenal glands are unremarkable. Mildly atrophic kidneys. Punctuate nonobstructive calculus of the inferior pole of the right kidney (series 6, image 62). Bladder is unremarkable. Stomach/Bowel: Stomach is within normal limits. Appendix appears normal. No evidence of bowel wall thickening, distention, or inflammatory changes. Descending and sigmoid diverticulosis. Vascular/Lymphatic: Aortic atherosclerosis. No enlarged abdominal or pelvic lymph nodes. Reproductive: Status post hysterectomy. Other: No abdominal wall hernia or abnormality. No ascites. Musculoskeletal: No acute osseous findings. CT LUMBAR SPINE FINDINGS Alignment: Normal lumbar lordosis. Vertebral bodies: Increased superior and inferior endplate deformities of L2, of uncertain acuity (series 503, image 44). No other evidence of fracture. No suspicious osseous lesions. Disc spaces: Mild multilevel disc space height loss and osteophytosis. Mild facet degenerative change of the lower lumbar levels. Paraspinous soft tissues: Unremarkable. IMPRESSION: 1. Increased superior and inferior endplate deformities of L2, of uncertain acuity. Correlate for point tenderness. MRI may  be used to assess for pathologic fracture and fracture acuity. 2. No other CT evidence of acute abnormality of the abdomen or pelvis. 3. Punctuate nonobstructive calculus of the inferior pole of the right kidney. 4. Cholelithiasis without evidence of acute cholecystitis. 5. Descending and sigmoid diverticulosis without evidence of acute diverticulitis. 6. Coronary artery disease. Aortic Atherosclerosis (ICD10-I70.0). Electronically Signed   By: Fredricka Jenny M.D.   On: 05/27/2024 11:20    Demaris Fillers, DO  [facility]  If 7PM-7AM, please contact night-coverage [email or web address] Password [email or web address] 05/29/2024, 4:25 PM   LOS: 2 days

## 2024-05-29 NOTE — Progress Notes (Signed)
 Bloated and belching but not vomiting.  Sister and patient report that this has been happening for several months and primary md started on pepcid  which is not effective.  Contacted Dr. Winferd Hatter and her ordered protonix.  Has had back brace on all day.

## 2024-05-29 NOTE — Discharge Summary (Addendum)
 Physician Discharge Summary   Patient: Brittany Archer MRN: [id number] DOB: 1941/04/15  Admit date:     05/27/2024  Discharge date: 05/30/2024  Discharge Physician: Myrtie Atkinson Mattison Golay   PCP: Twylla Galen, MD   Recommendations at discharge:   Please follow up with primary care provider within 1-2 weeks  Please repeat BMP and CBC in one week   Hospital Course: 64 female with a history of diabetes mellitus type 2, retention, coronary disease, bilateral breast cancer, myeloma, CKD, cognitive impairment presenting with worsening low back pain.  History is obtained from review of the medical record, the patient's sister, and niece.  patient has a long history (1 to 2 years) of mild intermittent low back pain which was usually well-controlled with Tylenol  up to earlier this week.  Patient has been complaining of worsening right-sided lower back pain within the last 3 days and it appears that Tylenol  has not been controlling the pain that much.  However, she continued to ambulate without any assistive device until this morning when she had difficulty in being able to get up from bed due to pain, so EMS was activated and patient was sent to the ED for further evaluation and management.  She denies burning sensation or urination or any other irritative bladder symptoms.   ED Course:  In the emergency department, respiratory rate on arrival to the ED was 21/min, BP was 156/60, but other vital signs were within normal range.  Workup in the ED showed macrocytic anemia and thrombocytopenia.  Urinalysis was positive for nitrite and trace leukocytes with only 21-50 WBCs. MRI lumbar spine with and without IV contrast showed acute appearing compression fracture of the L2 vertebral body with severe central height loss, marrow edema, and enhancement. Grade 1 anterolisthesis of L4 on L5 contributes to moderate spinal canal stenosis at L4-L5. Severe right and moderate left neural foraminal narrowing at L5-S1. Empiric IV  ceftriaxone  was given due to presumed UTI.  Potassium was replenished, morphine  and oxycodone were given, Protonix was also given.  Neurosurgery was consulted and recommended TLSO without any need for surgical intervention. Hospitalist was asked to admit patient for further evaluation and management.    Assessment and Plan:  Closed compression fracture of L2 lumbar vertebra  -Continue oxycodone 5 mg every 4 hours as needed for moderate/severe pain -Continue TLSO PT and OT eval and treat>> SNF - MRI L-spine as discussed above - oxycodone prn pain - pain controlled with oxycodone   Hypokalemia -Repleted - continue daily potassium supplement - mag 2.0   UTI--Klebsiella -initially ceftriaxone  pending culture data -CT renal--mildly atrophic kidneys.  No hydronephrosis.  Cholelithiasis.  Nonobstructive calculus in the right kidney. - transition to cephalexin  x 3 more days after d/c   Coronary artery disease -No chest pain presently -Continue aspirin  and statin   Myeloma -Follow-up Dr. Cheree Cords - As her myeloma gotten recently worse, Revlimid  was added back on 01/11/2024 --Continue Revlimid  10 mg 3 weeks on/1 week off and Velcade  every 2 weeks.   Bilateral breast cancer - Due to recent worsening of myeloma, definitive breast cancer management was put on back partner. --Continue anastrozole  daily     Macrocytic anemia - B12 359 - Folic acid  8.3 - Felt to be in part due to myelosuppression - Baseline hemoglobin 9-10      Consultants: none Procedures performed: none  Disposition: Skilled nursing facility Diet recommendation:  Regular diet DISCHARGE MEDICATION: Allergies as of 05/30/2024       Reactions   Motrin [  ibuprofen] Rash   Seasonal Ic [cholestatin] Other (See Comments)   Sneezing, watery eyes        Medication List     TAKE these medications    Accu-Chek Aviva Plus test strip Generic drug: glucose blood CHECK BLOOD SUGAR ONCE DAILY   acetaminophen   500 MG tablet Commonly known as: TYLENOL  Take 2 tablets (1,000 mg total) by mouth every 8 (eight) hours.   anastrozole  1 MG tablet Commonly known as: ARIMIDEX  Take 1 tablet (1 mg total) by mouth daily.   aspirin  81 MG tablet Take 1 tablet (81 mg total) by mouth daily with breakfast.   atorvastatin 10 MG tablet Commonly known as: LIPITOR Take 10 mg by mouth daily.   bortezomib  IV 3.5 MG injection Commonly known as: VELCADE  3.5 mg once a week. weekly   cephALEXin  500 MG capsule Commonly known as: KEFLEX  Take 1 capsule (500 mg total) by mouth every 8 (eight) hours. X 3 days   famotidine  20 MG tablet Commonly known as: PEPCID  Take 20 mg by mouth at bedtime as needed.   lenalidomide  10 MG capsule Commonly known as: REVLIMID  Take 1 capsule (10 mg total) by mouth daily. Take daily for 21 days, with 7 days off   magnesium  oxide 400 (240 Mg) MG tablet Commonly known as: MAG-OX TAKE ONE TABLET BY MOUTH THREE TIMES A DAY   metFORMIN  500 MG 24 hr tablet Commonly known as: GLUCOPHAGE -XR SMARTSIG:1 Tablet(s) By Mouth Every Evening   oxyCODONE 5 MG immediate release tablet Commonly known as: Oxy IR/ROXICODONE Take 1 tablet (5 mg total) by mouth every 4 (four) hours as needed for moderate pain (pain score 4-6) or severe pain (pain score 7-10).   pantoprazole 40 MG tablet Commonly known as: PROTONIX Take 1 tablet (40 mg total) by mouth daily.   potassium chloride  SA 20 MEQ tablet Commonly known as: KLOR-CON  M Take 20 mEq by mouth 2 (two) times daily.        Contact information for after-discharge care     Destination     [facility] .   Service: Skilled Nursing Contact information: [address] [place]  [address] [phone]                    Discharge Exam: Cleavon Curls Weights   05/27/24 0944 05/27/24 2045  Weight: 69.4 kg 69 kg   HEENT:  Bartholomew/AT, No thrush, no icterus CV:  RRR, no rub, no S3, no  S4 Lung:  CTA, no wheeze, no rhonchi Abd:  soft/+BS, NT Ext:  No edema, no lymphangitis, no synovitis, no rash   Condition at discharge: stable  The results of significant diagnostics from this hospitalization (including imaging, microbiology, ancillary and laboratory) are listed below for reference.   Imaging Studies: MR Lumbar Spine W Wo Contrast Result Date: 05/27/2024 EXAM: MR Lumbar Spine with and without intravenous contrast. 05/27/2024 03:15:57 PM TECHNIQUE: Multiplanar multisequence MRI of the lumbar spine was performed with and without the administration of intravenous contrast. COMPARISON: Lumbar spine CT dated 05/27/2024. CLINICAL HISTORY: Low back pain, cancer suspected; Back pain history of multiple myeloma. F/u L2 fracture seen on CT today. FINDINGS: BONES AND ALIGNMENT: Conventional lumbosacral anatomy is assumed with 5 non-rib-bearing, lumbar-type vertebral bodies. Compression fracture of the L2 vertebral body with severe central height loss, marrow edema, and enhancement. Grade 1 anterolisthesis of L4 on L5. SPINAL CORD: The conus terminates at L2. SOFT TISSUES: Partially imaged cystic lesion arising from the  lower pole of the left kidney. Attention on same day abdominal CT. Moderate fatty atrophy of the paraspinal muscles. L1-L2: Small disc bulge. L2-L3: Facet arthropathy. L3-L4: Left eccentric disc bulge and mild bilateral facet arthropathy contribute to moderate left neural foraminal narrowing. L4-L5: Anterolisthesis with uncovered disc and moderate bilateral facet arthropathy with periarticular edema and joint effusions contribute to moderate spinal canal stenosis and moderate left neural foraminal narrowing. L5-S1: Disc bulge and right greater than left facet arthropathy result in severe right and moderate left neural foraminal narrowing. IMPRESSION: 1. Acute appearing compression fracture of the L2 vertebral body with severe central height loss, marrow edema, and enhancement. 2.  Grade 1 anterolisthesis of L4 on L5 contributes to moderate spinal canal stenosis at L4-L5. 3. Severe right and moderate left neural foraminal narrowing at L5-S1. Electronically signed by: Audra Blend MD 05/27/2024 04:22 PM EDT RP Workstation: [id number]   CT Renal Stone Study Result Date: 05/27/2024 CLINICAL DATA:  Abdominal and flank pain, history of multiple myeloma * Tracking Code: BO * EXAM: CT ABDOMEN AND PELVIS WITHOUT CONTRAST CT LUMBAR SPINE WITHOUT CONTRAST TECHNIQUE: Multidetector CT imaging of the abdomen and pelvis was performed following the standard protocol without IV contrast. Multidetector CT imaging of the lumbar spine was performed following the standard protocol without IV contrast. RADIATION DOSE REDUCTION: This exam was performed according to the departmental dose-optimization program which includes automated exposure control, adjustment of the mA and/or kV according to patient size and/or use of iterative reconstruction technique. COMPARISON:  PET-CT, 12/16/2023 FINDINGS: CT ABDOMEN PELVIS FINDINGS Lower chest: No acute findings. Bandlike scarring or atelectasis of the lung bases. Coronary artery calcifications. Small hiatal hernia. Hepatobiliary: No solid liver abnormality is seen. Faintly calcified gallstones (series 6, image 56). No biliary ductal dilatation. Pneumobilia (series 3, image 19). Pancreas: Unremarkable. No pancreatic ductal dilatation or surrounding inflammatory changes. Spleen: Normal in size without significant abnormality. Adrenals/Urinary Tract: Adrenal glands are unremarkable. Mildly atrophic kidneys. Punctuate nonobstructive calculus of the inferior pole of the right kidney (series 6, image 62). Bladder is unremarkable. Stomach/Bowel: Stomach is within normal limits. Appendix appears normal. No evidence of bowel wall thickening, distention, or inflammatory changes. Descending and sigmoid diverticulosis. Vascular/Lymphatic: Aortic atherosclerosis. No enlarged  abdominal or pelvic lymph nodes. Reproductive: Status post hysterectomy. Other: No abdominal wall hernia or abnormality. No ascites. Musculoskeletal: No acute osseous findings. CT LUMBAR SPINE FINDINGS Alignment: Normal lumbar lordosis. Vertebral bodies: Increased superior and inferior endplate deformities of L2, of uncertain acuity (series 503, image 44). No other evidence of fracture. No suspicious osseous lesions. Disc spaces: Mild multilevel disc space height loss and osteophytosis. Mild facet degenerative change of the lower lumbar levels. Paraspinous soft tissues: Unremarkable. IMPRESSION: 1. Increased superior and inferior endplate deformities of L2, of uncertain acuity. Correlate for point tenderness. MRI may be used to assess for pathologic fracture and fracture acuity. 2. No other CT evidence of acute abnormality of the abdomen or pelvis. 3. Punctuate nonobstructive calculus of the inferior pole of the right kidney. 4. Cholelithiasis without evidence of acute cholecystitis. 5. Descending and sigmoid diverticulosis without evidence of acute diverticulitis. 6. Coronary artery disease. Aortic Atherosclerosis (ICD10-I70.0). Electronically Signed   By: Fredricka Jenny M.D.   On: 05/27/2024 11:20   CT L-SPINE NO CHARGE Result Date: 05/27/2024 CLINICAL DATA:  Abdominal and flank pain, history of multiple myeloma * Tracking Code: BO * EXAM: CT ABDOMEN AND PELVIS WITHOUT CONTRAST CT LUMBAR SPINE WITHOUT CONTRAST TECHNIQUE: Multidetector CT imaging of the abdomen and pelvis was  performed following the standard protocol without IV contrast. Multidetector CT imaging of the lumbar spine was performed following the standard protocol without IV contrast. RADIATION DOSE REDUCTION: This exam was performed according to the departmental dose-optimization program which includes automated exposure control, adjustment of the mA and/or kV according to patient size and/or use of iterative reconstruction technique. COMPARISON:   PET-CT, 12/16/2023 FINDINGS: CT ABDOMEN PELVIS FINDINGS Lower chest: No acute findings. Bandlike scarring or atelectasis of the lung bases. Coronary artery calcifications. Small hiatal hernia. Hepatobiliary: No solid liver abnormality is seen. Faintly calcified gallstones (series 6, image 56). No biliary ductal dilatation. Pneumobilia (series 3, image 19). Pancreas: Unremarkable. No pancreatic ductal dilatation or surrounding inflammatory changes. Spleen: Normal in size without significant abnormality. Adrenals/Urinary Tract: Adrenal glands are unremarkable. Mildly atrophic kidneys. Punctuate nonobstructive calculus of the inferior pole of the right kidney (series 6, image 62). Bladder is unremarkable. Stomach/Bowel: Stomach is within normal limits. Appendix appears normal. No evidence of bowel wall thickening, distention, or inflammatory changes. Descending and sigmoid diverticulosis. Vascular/Lymphatic: Aortic atherosclerosis. No enlarged abdominal or pelvic lymph nodes. Reproductive: Status post hysterectomy. Other: No abdominal wall hernia or abnormality. No ascites. Musculoskeletal: No acute osseous findings. CT LUMBAR SPINE FINDINGS Alignment: Normal lumbar lordosis. Vertebral bodies: Increased superior and inferior endplate deformities of L2, of uncertain acuity (series 503, image 44). No other evidence of fracture. No suspicious osseous lesions. Disc spaces: Mild multilevel disc space height loss and osteophytosis. Mild facet degenerative change of the lower lumbar levels. Paraspinous soft tissues: Unremarkable. IMPRESSION: 1. Increased superior and inferior endplate deformities of L2, of uncertain acuity. Correlate for point tenderness. MRI may be used to assess for pathologic fracture and fracture acuity. 2. No other CT evidence of acute abnormality of the abdomen or pelvis. 3. Punctuate nonobstructive calculus of the inferior pole of the right kidney. 4. Cholelithiasis without evidence of acute  cholecystitis. 5. Descending and sigmoid diverticulosis without evidence of acute diverticulitis. 6. Coronary artery disease. Aortic Atherosclerosis (ICD10-I70.0). Electronically Signed   By: Fredricka Jenny M.D.   On: 05/27/2024 11:20    Microbiology: Results for orders placed or performed during the hospital encounter of 05/27/24  Urine Culture     Status: Abnormal   Collection Time: 05/27/24 11:21 AM   Specimen: Urine, Clean Catch  Result Value Ref Range Status   Specimen Description   Final    URINE, CLEAN CATCH Performed at [facility], [address] [place], [place] [address]    Special Requests   Final    NONE Performed at [facility], [address], [place], [place] [address]    Culture >=100,000 COLONIES/mL KLEBSIELLA PNEUMONIAE (A)  Final   Report Status 05/29/2024 FINAL  Final   Organism ID, Bacteria KLEBSIELLA PNEUMONIAE (A)  Final      Susceptibility   Klebsiella pneumoniae - MIC*    AMPICILLIN  >=32 RESISTANT Resistant     CEFAZOLIN <=4 SENSITIVE Sensitive     CEFEPIME <=0.12 SENSITIVE Sensitive     CEFTRIAXONE  <=0.25 SENSITIVE Sensitive     CIPROFLOXACIN <=0.25 SENSITIVE Sensitive     GENTAMICIN <=1 SENSITIVE Sensitive     IMIPENEM <=0.25 SENSITIVE Sensitive     NITROFURANTOIN 64 INTERMEDIATE Intermediate     TRIMETH /SULFA  <=20 SENSITIVE Sensitive     AMPICILLIN /SULBACTAM 4 SENSITIVE Sensitive     PIP/TAZO <=4 SENSITIVE Sensitive ug/mL    * >=100,000 COLONIES/mL KLEBSIELLA PNEUMONIAE   *Note: Due to a large number of results and/or encounters for the requested time period,  some results have not been displayed. A complete set of results can be found in Results Review.    Labs: CBC: Recent Labs  Lab 05/27/24 1008 05/28/24 0316  WBC 4.0 4.0  HGB 10.0* 8.9*  HCT 31.4* 28.1*  MCV 100.6* 101.1*  PLT 141* 139*   Basic Metabolic Panel: Recent Labs  Lab 05/27/24 1008 05/28/24 0316 05/30/24 0431  NA 137 138 135  K 2.9* 3.8 3.2*  CL 106 106 104  CO2 24  24 24   GLUCOSE 176* 134* 114*  BUN 10 14 22   CREATININE 0.91 0.91 1.04*  CALCIUM 8.8* 8.7* 9.1  MG  --  2.0 2.0  PHOS  --  3.5  --    Liver Function Tests: Recent Labs  Lab 05/27/24 1008 05/28/24 0316  AST 11* 9*  ALT 9 9  ALKPHOS 97 86  BILITOT 1.5* 1.3*  PROT 6.4* 6.0*  ALBUMIN 2.9* 2.5*   CBG: Recent Labs  Lab 05/29/24 0722 05/29/24 1110 05/29/24 1711 05/29/24 2100 05/30/24 0716  GLUCAP 165* 150* 170* 157* 122*    Discharge time spent: greater than 30 minutes.  Signed: Demaris Fillers, MD [facility] 05/30/2024

## 2024-05-29 NOTE — NC FL2 (Signed)
 Brittany Archer  MEDICAID FL2 LEVEL OF CARE FORM     IDENTIFICATION  Patient Name: Brittany Archer Birthdate: 02/05/1941 Sex: female Admission Date (Current Location): 05/27/2024  [place] and [place] Number:  Brittany Archer [id number] A Facility and Address:  [facility],  [address], Brittany Archer [address]      Provider Number: [id number]  Attending Physician Name and Address:  Demaris Fillers, MD  Relative Name and Phone Number:  Brittany Archer (Sister)  [phone] [facility] Phone)    Current Level of Care: Hospital Recommended Level of Care: Skilled Nursing Facility Prior Approval Number:    Date Approved/Denied:   PASRR Number: [id number]  Discharge Plan: SNF    Current Diagnoses: Patient Active Problem List   Diagnosis Date Noted   Closed compression fracture of L2 lumbar vertebra, initial encounter (HCC) 05/27/2024   Type 2 diabetes mellitus with hyperglycemia (HCC) 05/27/2024   GERD (gastroesophageal reflux disease) 05/27/2024   Genetic testing 05/04/2024   Breast cancer of upper-inner quadrant of left female breast (HCC) 03/30/2024   Bilateral breast cancer (HCC) 03/30/2024   Macrocytic anemia 03/14/2022   Hypoglycemia 03/14/2022   CKD (chronic kidney disease), stage III B 03/14/2022   UTI (urinary tract infection) 03/14/2022   Dementia without behavioral disturbance (HCC) 03/14/2022   Open mouth wound 11/15/2020   Encounter for peripheral line placement 03/25/2020   PICC (peripherally inserted central catheter) in place 03/18/2020   Leukopenia 03/18/2020   Osteomyelitis, jaw acute    Osteonecrosis (HCC)    Confusion 02/20/2020   Facial cellulitis 02/19/2020   Type 2 diabetes mellitus (HCC)    Coronary artery disease    Hypertension    Stage 3b chronic kidney disease (HCC)    Hypomagnesemia    Hypophosphatemia    Hypokalemia 01/03/2020   CKD (chronic kidney disease) 05/01/2019   B12 deficiency 02/09/2018   Multiple myeloma not having achieved remission (HCC)  01/20/2018   Iron deficiency anemia 11/25/2017    Orientation RESPIRATION BLADDER Height & Weight     Self, Time, Situation, Place  Normal Incontinent Weight: 69 kg Height:  5\' 4"  (162.6 cm)  BEHAVIORAL SYMPTOMS/MOOD NEUROLOGICAL BOWEL NUTRITION STATUS      Continent Diet (see dc summary)  AMBULATORY STATUS COMMUNICATION OF NEEDS Skin   Limited Assist Verbally Normal                       Personal Care Assistance Level of Assistance  Bathing, Feeding, Dressing Bathing Assistance: Limited assistance Feeding assistance: Limited assistance Dressing Assistance: Limited assistance     Functional Limitations Info  Sight          SPECIAL CARE FACTORS FREQUENCY                       Contractures Contractures Info: Not present    Additional Factors Info  Code Status, Allergies Code Status Info: Full Allergies Info: Allergies: Motrin (Ibuprofen), Seasonal Ic (Cholestatin)           Current Medications (05/29/2024):  This is the current hospital active medication list Current Facility-Administered Medications  Medication Dose Route Frequency Provider Last Rate Last Admin   0.9 %  sodium chloride  infusion   Intravenous PRN Demaris Fillers, MD 10 mL/hr at 05/29/24 0908 New Bag at 05/29/24 0908   acetaminophen  (TYLENOL ) tablet 650 mg  650 mg Oral Q6H PRN Adefeso, Oladapo, DO   650 mg at 05/28/24 0128   Or   acetaminophen  (TYLENOL ) suppository 650 mg  650 mg Rectal Q6H PRN Adefeso, Oladapo, DO       anastrozole  (ARIMIDEX ) tablet 1 mg  1 mg Oral Daily Adefeso, Oladapo, DO   1 mg at 05/29/24 0913   aspirin  EC tablet 81 mg  81 mg Oral Daily Tat, Myrtie Atkinson, MD   81 mg at 05/29/24 0912   atorvastatin (LIPITOR) tablet 10 mg  10 mg Oral Daily Adefeso, Oladapo, DO   10 mg at 05/29/24 0912   cefTRIAXone  (ROCEPHIN ) 1 g in sodium chloride  0.9 % 100 mL IVPB  1 g Intravenous Q24H Adefeso, Oladapo, DO 200 mL/hr at 05/29/24 0912 1 g at 05/29/24 0912   enoxaparin  (LOVENOX ) injection 40 mg  40  mg Subcutaneous Q24H Adefeso, Oladapo, DO   40 mg at 05/28/24 2249   famotidine  (PEPCID ) tablet 20 mg  20 mg Oral QHS PRN Adefeso, Oladapo, DO       insulin  aspart (novoLOG ) injection 0-9 Units  0-9 Units Subcutaneous TID WC Adefeso, Oladapo, DO   1 Units at 05/29/24 1150   ondansetron  (ZOFRAN ) tablet 4 mg  4 mg Oral Q6H PRN Adefeso, Oladapo, DO       Or   ondansetron  (ZOFRAN ) injection 4 mg  4 mg Intravenous Q6H PRN Adefeso, Oladapo, DO   4 mg at 05/28/24 2249   oxyCODONE (Oxy IR/ROXICODONE) immediate release tablet 5 mg  5 mg Oral Q4H PRN Adefeso, Oladapo, DO   5 mg at 05/28/24 0837   polyethylene glycol (MIRALAX / GLYCOLAX) packet 17 g  17 g Oral Daily Adefeso, Oladapo, DO   17 g at 05/29/24 1610     Discharge Medications: Please see discharge summary for a list of discharge medications.  Relevant Imaging Results:  Relevant Lab Results:   Additional Information [id number]  Geraldina Klinefelter, RN

## 2024-05-30 LAB — BASIC METABOLIC PANEL WITH GFR
Anion gap: 7 (ref 5–15)
BUN: 22 mg/dL (ref 8–23)
CO2: 24 mmol/L (ref 22–32)
Calcium: 9.1 mg/dL (ref 8.9–10.3)
Chloride: 104 mmol/L (ref 98–111)
Creatinine, Ser: 1.04 mg/dL — ABNORMAL HIGH (ref 0.44–1.00)
GFR, Estimated: 53 mL/min — ABNORMAL LOW (ref >60)
Glucose, Bld: 114 mg/dL — ABNORMAL HIGH (ref 70–99)
Potassium: 3.2 mmol/L — ABNORMAL LOW (ref 3.5–5.1)
Sodium: 135 mmol/L (ref 135–145)

## 2024-05-30 LAB — GLUCOSE, CAPILLARY
Glucose-Capillary: 122 mg/dL — ABNORMAL HIGH (ref 70–99)
Glucose-Capillary: 173 mg/dL — ABNORMAL HIGH (ref 70–99)

## 2024-05-30 LAB — MAGNESIUM: Magnesium: 2 mg/dL (ref 1.7–2.4)

## 2024-05-30 MED ORDER — POTASSIUM CHLORIDE CRYS ER 20 MEQ PO TBCR
40.0000 meq | EXTENDED_RELEASE_TABLET | Freq: Once | ORAL | Status: AC
  Administered 2024-05-30: 40 meq via ORAL
  Filled 2024-05-30: qty 2

## 2024-05-30 MED ORDER — CEPHALEXIN 500 MG PO CAPS: 500.0000 mg | ORAL_CAPSULE | Freq: Three times a day (TID) | ORAL | Status: DC

## 2024-05-30 NOTE — TOC Transition Note (Signed)
 Transition of Care [facility]) - Discharge Note   Patient Details  Name: Brittany Archer MRN: [id number] Date of Birth: 28-Nov-1941  [facility] CM/SW Contact:  Grandville Lax, LCSWA Phone Number: 05/30/2024, 2:29 PM   Clinical Narrative:    CSW noted that insurance [employer] has been approved for SNF at [facility]. CSW updated Debbie in admissions who confirms they can accept pt today. CSW sent D/C clinicals to SNF via [facility]. CSW updated pts sister of plan for D/C. RN provided with room and report numbers. Med necessity printed to floor for RN. EMS called for transport. TOC signing off.   Final next level of care: Skilled Nursing Facility Barriers to Discharge: Barriers Resolved   Patient Goals and CMS Choice Patient states their goals for this hospitalization and ongoing recovery are:: go to SNF CMS Medicare.gov Compare Post Acute Care list provided to:: Patient Choice offered to / list presented to : [facility] ownership interest in [facility].provided to:: Patient    Discharge Placement                Patient to be transferred to facility by: EMS Name of family member notified: Sister Patient and family notified of of transfer: 05/30/24  Discharge Plan and Services Additional resources added to the After Visit Summary for                                       Social Drivers of Health (SDOH) Interventions SDOH Screenings   Food Insecurity: No Food Insecurity (05/27/2024)  Housing: Low Risk  (05/27/2024)  Transportation Needs: No Transportation Needs (05/27/2024)  Utilities: Not At Risk (05/27/2024)  Alcohol Screen: Low Risk  (12/09/2020)  Depression (PHQ2-9): Low Risk  (03/18/2020)  Financial Resource Strain: Low Risk  (12/09/2020)  Physical Activity: Inactive (12/09/2020)  Social Connections: Moderately Isolated (05/27/2024)  Stress: No Stress Concern Present (12/09/2020)  Tobacco Use: Low Risk  (05/27/2024)     Readmission Risk  Interventions     No data to display

## 2024-05-30 NOTE — Evaluation (Signed)
 Occupational Therapy Evaluation Patient Details Name: Brittany Archer MRN: [id number] DOB: July 07, 1941 Today's Date: 05/30/2024   History of Present Illness   Brittany Archer is a 83 y.o. female with medical history significant of , CAD, type 2 diabetes mellitus, HTN, Hx of left breast cancer, multiple myeloma who presents to the emergency department from home via EMS due to low back pain.  Patient states that she does not know why she came to the hospital, most of the history was obtained from ED PA and sister at bedside.  Per report, patient has a long history (1 to 2 years) of mild intermittent low back pain which was usually well-controlled with Tylenol  up to earlier this week.  Patient has been complaining of worsening right-sided lower back pain within the last 3 days and it appears that Tylenol  has not been controlling the pain that much.  However, she continued to ambulate without any assistive device until this morning when she had difficulty in being able to get up from bed due to pain, so EMS was activated and patient was sent to the ED for further evaluation and management.  She denies burning sensation or urination or any other irritative bladder symptoms. (per DO)     Clinical Impressions Pt agreeable to OT and PT co-evaluation. Pt not oriented to year or president today. Pt able to follow commands well despite mild confusion. Pt demonstrated need for mod A for bed mobility and min to mod A for transfer to chair with RW. TLSO donned for the duration of the session. Pt able to doff and don sock at EOB with mild extended time. Some assist likely needed for complete lower body dressing and bathing. Pt left in the chair with chair alarm set and call bell within reach. Pt will benefit from continued OT in the hospital and recommended venue below to increase strength, balance, and endurance for safe ADL's.        If plan is discharge home, recommend the following:   A lot of help with  walking and/or transfers;A little help with bathing/dressing/bathroom;Assistance with cooking/housework;Assist for transportation;Direct supervision/assist for medications management;Help with stairs or ramp for entrance     Functional Status Assessment   Patient has had a recent decline in their functional status and demonstrates the ability to make significant improvements in function in a reasonable and predictable amount of time.     Equipment Recommendations   None recommended by OT             Precautions/Restrictions   Precautions Precautions: Fall;Other (comment) Recall of Precautions/Restrictions: Impaired Precaution/Restrictions Comments: TSLO when up Required Braces or Orthoses: Spinal Brace Spinal Brace: Thoracolumbosacral orthotic Restrictions Weight Bearing Restrictions Per Provider Order: No     Mobility Bed Mobility Overal bed mobility: Needs Assistance Bed Mobility: Rolling, Sidelying to Sit Rolling: Min assist, Mod assist Sidelying to sit: Mod assist, HOB elevated       General bed mobility comments: labored movement; assist to push from sidelying to sitting    Transfers Overall transfer level: Needs assistance Equipment used: Rolling walker (2 wheels) Transfers: Sit to/from Stand, Bed to chair/wheelchair/BSC Sit to Stand: Min assist, Mod assist     Step pivot transfers: Min assist, Mod assist     General transfer comment: very slow and labored movement      Balance Overall balance assessment: Needs assistance Sitting-balance support: No upper extremity supported, Feet supported Sitting balance-Leahy Scale: Fair Sitting balance - Comments: fair to good at EOB  Standing balance support: Bilateral upper extremity supported, During functional activity, Reliant on assistive device for balance Standing balance-Leahy Scale: Poor Standing balance comment: poor to fair with RW                           ADL either performed or  assessed with clinical judgement   ADL Overall ADL's : Needs assistance/impaired     Grooming: Set up;Sitting   Upper Body Bathing: Set up;Sitting   Lower Body Bathing: Sitting/lateral leans;Contact guard assist;Minimal assistance   Upper Body Dressing : Set up;Sitting   Lower Body Dressing: Contact guard assist;Set up;Sitting/lateral leans Lower Body Dressing Details (indicate cue type and reason): Able to doff and don sock seated at EOB today. Toilet Transfer: Minimal assistance;Moderate assistance;Stand-pivot;Rolling walker (2 wheels);Ambulation Toilet Transfer Details (indicate cue type and reason): simualted via EOB to chair transfer and ambulation in the hall Toileting- Clothing Manipulation and Hygiene: Moderate assistance;Sitting/lateral lean       Functional mobility during ADLs: Minimal assistance;Moderate assistance;Rolling walker (2 wheels)       Vision Baseline Vision/History: 0 No visual deficits Ability to See in Adequate Light: 0 Adequate Patient Visual Report: No change from baseline Vision Assessment?: No apparent visual deficits     Perception Perception: Not tested       Praxis Praxis: Not tested       Pertinent Vitals/Pain Pain Assessment Pain Assessment: Faces Faces Pain Scale: Hurts a little bit Pain Location: back; L LE Pain Descriptors / Indicators: Discomfort, Sore Pain Intervention(s): Limited activity within patient's tolerance, Monitored during session, Repositioned     Extremity/Trunk Assessment Upper Extremity Assessment Upper Extremity Assessment: Generalized weakness   Lower Extremity Assessment Lower Extremity Assessment: Defer to PT evaluation   Cervical / Trunk Assessment Cervical / Trunk Assessment: Kyphotic   Communication Communication Communication: No apparent difficulties   Cognition Arousal: Alert Behavior During Therapy: WFL for tasks assessed/performed Cognition: History of cognitive impairments              OT - Cognition Comments: Oriented to person and place; not oriented to time or president.                 Following commands: Intact       Cueing  General Comments   Cueing Techniques: Verbal cues;Tactile cues                 Home Living Family/patient expects to be discharged to:: Private residence Living Arrangements: Other relatives (sister and brother-in-law) Available Help at Discharge: Family;Available 24 hours/day Type of Home: House Home Access: Level entry     Home Layout: One level               Home Equipment: None          Prior Functioning/Environment Prior Level of Function : Needs assist       Physical Assist : Mobility (physical);ADLs (physical)   ADLs (physical): IADLs Mobility Comments: Independent without AD ADLs Comments: Independent ADL; assist IADL from family    OT Problem List: Decreased strength;Decreased activity tolerance;Impaired balance (sitting and/or standing);Decreased cognition;Decreased safety awareness   OT Treatment/Interventions: Self-care/ADL training;Therapeutic exercise;DME and/or AE instruction;Therapeutic activities;Patient/family education;Balance training;Cognitive remediation/compensation      OT Goals(Current goals can be found in the care plan section)   Acute Rehab OT Goals Patient Stated Goal: get stronger OT Goal Formulation: With patient Time For Goal Achievement: 06/13/24 Potential to Achieve Goals: Good   OT  Frequency:  Min 2X/week    Co-evaluation PT/OT/SLP Co-Evaluation/Treatment: Yes Reason for Co-Treatment: To address functional/ADL transfers PT goals addressed during session: Mobility/safety with mobility;Balance;Proper use of DME OT goals addressed during session: ADL's and self-care                       End of Session Equipment Utilized During Treatment: Gait belt;Rolling walker (2 wheels)  Activity Tolerance: Patient tolerated treatment well Patient left: in  chair;with call bell/phone within reach;with chair alarm set  OT Visit Diagnosis: Unsteadiness on feet (R26.81);Other abnormalities of gait and mobility (R26.89);Muscle weakness (generalized) (M62.81);Other symptoms and signs involving cognitive function                Time: 4782-9562 OT Time Calculation (min): 20 min Charges:  OT General Charges $OT Visit: 1 Visit OT Evaluation $OT Eval Low Complexity: 1 Low  Chanelle Hodsdon OT, MOT   Thurnell Floss 05/30/2024, 8:50 AM

## 2024-05-30 NOTE — Plan of Care (Signed)
  Problem: Education: Goal: Knowledge of General Education information will improve Description: Including pain rating scale, medication(s)/side effects and non-pharmacologic comfort measures Outcome: Adequate for Discharge   Problem: Health Behavior/Discharge Planning: Goal: Ability to manage health-related needs will improve Outcome: Adequate for Discharge   Problem: Clinical Measurements: Goal: Ability to maintain clinical measurements within normal limits will improve Outcome: Adequate for Discharge Goal: Will remain free from infection Outcome: Adequate for Discharge Goal: Diagnostic test results will improve Outcome: Adequate for Discharge Goal: Respiratory complications will improve Outcome: Adequate for Discharge Goal: Cardiovascular complication will be avoided Outcome: Adequate for Discharge   Problem: Activity: Goal: Risk for activity intolerance will decrease Outcome: Adequate for Discharge   Problem: Nutrition: Goal: Adequate nutrition will be maintained Outcome: Adequate for Discharge   Problem: Coping: Goal: Level of anxiety will decrease Outcome: Adequate for Discharge   Problem: Elimination: Goal: Will not experience complications related to bowel motility Outcome: Adequate for Discharge Goal: Will not experience complications related to urinary retention Outcome: Adequate for Discharge   Problem: Pain Managment: Goal: General experience of comfort will improve and/or be controlled Outcome: Adequate for Discharge   Problem: Safety: Goal: Ability to remain free from injury will improve Outcome: Adequate for Discharge   Problem: Skin Integrity: Goal: Risk for impaired skin integrity will decrease Outcome: Adequate for Discharge   Problem: Education: Goal: Ability to describe self-care measures that may prevent or decrease complications (Diabetes Survival Skills Education) will improve Outcome: Adequate for Discharge Goal: Individualized Educational  Video(s) Outcome: Adequate for Discharge   Problem: Coping: Goal: Ability to adjust to condition or change in health will improve Outcome: Adequate for Discharge   Problem: Fluid Volume: Goal: Ability to maintain a balanced intake and output will improve Outcome: Adequate for Discharge   Problem: Health Behavior/Discharge Planning: Goal: Ability to identify and utilize available resources and services will improve Outcome: Adequate for Discharge Goal: Ability to manage health-related needs will improve Outcome: Adequate for Discharge   Problem: Metabolic: Goal: Ability to maintain appropriate glucose levels will improve Outcome: Adequate for Discharge   Problem: Nutritional: Goal: Maintenance of adequate nutrition will improve Outcome: Adequate for Discharge Goal: Progress toward achieving an optimal weight will improve Outcome: Adequate for Discharge   Problem: Skin Integrity: Goal: Risk for impaired skin integrity will decrease Outcome: Adequate for Discharge   Problem: Tissue Perfusion: Goal: Adequacy of tissue perfusion will improve Outcome: Adequate for Discharge   Problem: Acute Rehab PT Goals(only PT should resolve) Goal: Pt will Roll Supine to Side Outcome: Adequate for Discharge Goal: Pt Will Go Supine/Side To Sit Outcome: Adequate for Discharge Goal: Pt Will Go Sit To Supine/Side Outcome: Adequate for Discharge Goal: Patient Will Transfer Sit To/From Stand Outcome: Adequate for Discharge Goal: Pt Will Ambulate Outcome: Adequate for Discharge   Problem: Acute Rehab OT Goals (only OT should resolve) Goal: Pt. Will Perform Grooming Outcome: Adequate for Discharge Goal: Pt. Will Perform Lower Body Bathing Outcome: Adequate for Discharge Goal: Pt. Will Perform Lower Body Dressing Outcome: Adequate for Discharge Goal: Pt. Will Transfer To Toilet Outcome: Adequate for Discharge Goal: Pt. Will Perform Toileting-Clothing Manipulation Outcome: Adequate for  Discharge Goal: Pt/Caregiver Will Perform Home Exercise Program Outcome: Adequate for Discharge

## 2024-05-30 NOTE — Progress Notes (Signed)
 Mobility Specialist Progress Note:    05/30/24 0904  Mobility  Activity Ambulated with assistance in room;Ambulated with assistance to bathroom  Level of Assistance Minimal assist, patient does 75% or more  Assistive Device Front wheel walker  Distance Ambulated (ft) 20 ft  Range of Motion/Exercises Active;All extremities  Activity Response Tolerated well  Mobility visit 1 Mobility  Mobility Specialist Start Time (ACUTE ONLY) 0850  Mobility Specialist Stop Time (ACUTE ONLY) 0904  Mobility Specialist Time Calculation (min) (ACUTE ONLY) 14 min   Pt received in chair requesting assistance to bathroom. Required MinA to stand and ambulate with RW. Tolerated well, asx throughout. Left pt with NT, all needs met.   Glinda Lapping Mobility Specialist Please contact via Special educational needs teacher or  Rehab office at [phone]

## 2024-05-30 NOTE — Progress Notes (Signed)
 Physical Therapy Treatment Patient Details Name: KHADEJAH SON MRN: [id number] DOB: 12/18/1941 Today's Date: 05/30/2024   History of Present Illness Brittany Archer is a 83 y.o. female with medical history significant of , CAD, type 2 diabetes mellitus, HTN, Hx of left breast cancer, multiple myeloma who presents to the emergency department from home via EMS due to low back pain.  Patient states that she does not know why she came to the hospital, most of the history was obtained from ED PA and sister at bedside.  Per report, patient has a long history (1 to 2 years) of mild intermittent low back pain which was usually well-controlled with Tylenol  up to earlier this week.  Patient has been complaining of worsening right-sided lower back pain within the last 3 days and it appears that Tylenol  has not been controlling the pain that much.  However, she continued to ambulate without any assistive device until this morning when she had difficulty in being able to get up from bed due to pain, so EMS was activated and patient was sent to the ED for further evaluation and management.  She denies burning sensation or urination or any other irritative bladder symptoms. (per DO)    PT Comments  Patient agreeable for therapy. Patient demonstrates fair return for rolling to side and sitting up from side lying position requiring use of bed rail with HOB partially raised and Mod assist. Patient unable to maintain standing balance without AD, required use for RW for safely completing sit to stands and transferring to chair, tolerated walking in room, but limited mostly due to c/o fatigue and BLE weakness. Patient tolerated sitting up in chair after therapy - nurse notified. Patient will benefit from continued skilled physical therapy in hospital and recommended venue below to increase strength, balance, endurance for safe ADLs and gait.      If plan is discharge home, recommend the following: A lot of help with  walking and/or transfers;A lot of help with bathing/dressing/bathroom;Help with stairs or ramp for entrance;Assistance with cooking/housework   Can travel by private vehicle     No  Equipment Recommendations  None recommended by PT    Recommendations for Other Services       Precautions / Restrictions Precautions Precautions: Fall Recall of Precautions/Restrictions: Intact Precaution/Restrictions Comments: TSLO when up Required Braces or Orthoses: Spinal Brace Spinal Brace: Thoracolumbosacral orthotic Restrictions Weight Bearing Restrictions Per Provider Order: No     Mobility  Bed Mobility Overal bed mobility: Needs Assistance Bed Mobility: Rolling, Sidelying to Sit Rolling: Mod assist Sidelying to sit: Mod assist, Used rails       General bed mobility comments: increased time, labored movment with fair carryover for propping up on left elbow    Transfers Overall transfer level: Needs assistance Equipment used: Rolling walker (2 wheels) Transfers: Sit to/from Stand, Bed to chair/wheelchair/BSC Sit to Stand: Min assist, Mod assist   Step pivot transfers: Min assist, Mod assist       General transfer comment: unsteady labored movement    Ambulation/Gait Ambulation/Gait assistance: Min assist, Mod assist Gait Distance (Feet): 22 Feet Assistive device: Rolling walker (2 wheels) Gait Pattern/deviations: Decreased step length - right, Decreased step length - left, Decreased stride length, Trunk flexed Gait velocity: decreased     General Gait Details: slow labored movement with c/o BLE weakness, left weaker than right, limited mostly due to fatigue   Optometrist  Tilt Bed    Modified Rankin (Stroke Patients Only)       Balance Overall balance assessment: Needs assistance Sitting-balance support: Feet supported, No upper extremity supported Sitting balance-Leahy Scale: Fair Sitting balance - Comments: seated at  EOB   Standing balance support: During functional activity, No upper extremity supported Standing balance-Leahy Scale: Poor Standing balance comment: fair/poor using RW                            Communication Communication Communication: No apparent difficulties  Cognition Arousal: Alert Behavior During Therapy: WFL for tasks assessed/performed   PT - Cognitive impairments: No apparent impairments                         Following commands: Intact      Cueing Cueing Techniques: Verbal cues, Tactile cues  Exercises      General Comments        Pertinent Vitals/Pain Pain Assessment Pain Assessment: Faces Faces Pain Scale: Hurts a little bit Pain Location: low back Pain Descriptors / Indicators: Discomfort, Sore Pain Intervention(s): Limited activity within patient's tolerance, Monitored during session, Repositioned    Home Living Family/patient expects to be discharged to:: Private residence Living Arrangements: Other relatives (sister and brother-in-law) Available Help at Discharge: Family;Available 24 hours/day Type of Home: House Home Access: Level entry       Home Layout: One level Home Equipment: None      Prior Function            PT Goals (current goals can now be found in the care plan section) Acute Rehab PT Goals Patient Stated Goal: to walk again PT Goal Formulation: With patient Time For Goal Achievement: 06/11/24 Potential to Achieve Goals: Good Progress towards PT goals: Progressing toward goals    Frequency    Min 3X/week      PT Plan      Co-evaluation PT/OT/SLP Co-Evaluation/Treatment: Yes Reason for Co-Treatment: To address functional/ADL transfers PT goals addressed during session: Mobility/safety with mobility;Balance;Proper use of DME OT goals addressed during session: ADL's and self-care      AM-PAC PT "6 Clicks" Mobility   Outcome Measure  Help needed turning from your back to your side while  in a flat bed without using bedrails?: A Lot Help needed moving from lying on your back to sitting on the side of a flat bed without using bedrails?: A Lot Help needed moving to and from a bed to a chair (including a wheelchair)?: A Lot Help needed standing up from a chair using your arms (e.g., wheelchair or bedside chair)?: A Little Help needed to walk in hospital room?: A Lot Help needed climbing 3-5 steps with a railing? : A Lot 6 Click Score: 13    End of Session Equipment Utilized During Treatment: Back brace;Gait belt Activity Tolerance: Patient tolerated treatment well;Patient limited by fatigue Patient left: in chair;with call bell/phone within reach;with chair alarm set Nurse Communication: Mobility status PT Visit Diagnosis: Other abnormalities of gait and mobility (R26.89);Muscle weakness (generalized) (M62.81);Unsteadiness on feet (R26.81)     Time: 2130-8657 PT Time Calculation (min) (ACUTE ONLY): 28 min  Charges:    $Gait Training: 8-22 mins $Therapeutic Activity: 8-22 mins PT General Charges $$ ACUTE PT VISIT: 1 Visit                     8:51 AM, 05/30/24 Walton Guppy, MPT Physical Therapist  with [facility] [phone] office [phone] mobile phone

## 2024-05-30 NOTE — Plan of Care (Signed)
  Problem: Acute Rehab OT Goals (only OT should resolve) Goal: Pt. Will Perform Grooming Flowsheets (Taken 05/30/2024 0853) Pt Will Perform Grooming:  with modified independence  standing Goal: Pt. Will Perform Lower Body Bathing Flowsheets (Taken 05/30/2024 0853) Pt Will Perform Lower Body Bathing: with modified independence Goal: Pt. Will Perform Lower Body Dressing Flowsheets (Taken 05/30/2024 0853) Pt Will Perform Lower Body Dressing: with modified independence Goal: Pt. Will Transfer To Toilet Flowsheets (Taken 05/30/2024 [phone]) Pt Will Transfer to Toilet:  with modified independence  ambulating Goal: Pt. Will Perform Toileting-Clothing Manipulation Flowsheets (Taken 05/30/2024 0853) Pt Will Perform Toileting - Clothing Manipulation and hygiene: with modified independence Goal: Pt/Caregiver Will Perform Home Exercise Program Flowsheets (Taken 05/30/2024 [phone]) Pt/caregiver will Perform Home Exercise Program:  Increased strength  Both right and left upper extremity  Independently  Isabel Freese OT, MOT

## 2024-05-30 NOTE — Care Management Important Message (Signed)
 Important Message  Patient Details  Name: Brittany Archer MRN: [id number] Date of Birth: 10-20-1941   Important Message Given:  N/A - LOS <3 / Initial given by admissions     [name] 05/30/2024, 10:52 AM

## 2024-05-31 ENCOUNTER — Encounter: Payer: Self-pay | Admitting: *Deleted

## 2024-05-31 NOTE — Progress Notes (Signed)
 Brittany Archer @ [place] was contacted by telephone to verify understanding of discharge instructions status post their most recent discharge from the hospital on the date:  05/30/24.  Inpatient discharge AVS was re-reviewed with patient, along with cancer center appointments.  Verification of understanding for oncology specific follow-up was validated using the Teach Back method.    Transportation to appointments were confirmed for the patient as being [facility].  Brittany Archer's questions were addressed to their satisfaction upon completion of this post discharge follow-up call for outpatient oncology.  Schedule faxed to [phone].

## 2024-06-01 ENCOUNTER — Inpatient Hospital Stay: Admitting: Hematology

## 2024-06-01 ENCOUNTER — Inpatient Hospital Stay

## 2024-06-13 ENCOUNTER — Other Ambulatory Visit: Payer: Self-pay

## 2024-06-15 ENCOUNTER — Inpatient Hospital Stay: Attending: Hematology

## 2024-06-15 ENCOUNTER — Inpatient Hospital Stay: Admitting: Hematology

## 2024-06-15 ENCOUNTER — Other Ambulatory Visit (HOSPITAL_COMMUNITY): Payer: Self-pay | Admitting: Internal Medicine

## 2024-06-15 VITALS — BP 142/62 | HR 57 | Temp 96.3°F | Resp 20 | Wt 146.9 lb

## 2024-06-15 DIAGNOSIS — Z5112 Encounter for antineoplastic immunotherapy: Secondary | ICD-10-CM | POA: Diagnosis present

## 2024-06-15 DIAGNOSIS — C9000 Multiple myeloma not having achieved remission: Secondary | ICD-10-CM | POA: Diagnosis present

## 2024-06-15 DIAGNOSIS — Z13820 Encounter for screening for osteoporosis: Secondary | ICD-10-CM

## 2024-06-15 DIAGNOSIS — E538 Deficiency of other specified B group vitamins: Secondary | ICD-10-CM

## 2024-06-15 DIAGNOSIS — E876 Hypokalemia: Secondary | ICD-10-CM | POA: Diagnosis not present

## 2024-06-15 DIAGNOSIS — N1832 Chronic kidney disease, stage 3b: Secondary | ICD-10-CM | POA: Insufficient documentation

## 2024-06-15 DIAGNOSIS — D631 Anemia in chronic kidney disease: Secondary | ICD-10-CM | POA: Insufficient documentation

## 2024-06-15 LAB — CBC WITH DIFFERENTIAL/PLATELET
Abs Immature Granulocytes: 0.02 10*3/uL (ref 0.00–0.07)
Basophils Absolute: 0.1 10*3/uL (ref 0.0–0.1)
Basophils Relative: 2 %
Eosinophils Absolute: 0.1 10*3/uL (ref 0.0–0.5)
Eosinophils Relative: 2 %
HCT: 29.7 % — ABNORMAL LOW (ref 36.0–46.0)
Hemoglobin: 9.8 g/dL — ABNORMAL LOW (ref 12.0–15.0)
Immature Granulocytes: 1 %
Lymphocytes Relative: 42 %
Lymphs Abs: 1.6 10*3/uL (ref 0.7–4.0)
MCH: 33 pg (ref 26.0–34.0)
MCHC: 33 g/dL (ref 30.0–36.0)
MCV: 100 fL (ref 80.0–100.0)
Monocytes Absolute: 0.3 10*3/uL (ref 0.1–1.0)
Monocytes Relative: 9 %
Neutro Abs: 1.8 10*3/uL (ref 1.7–7.7)
Neutrophils Relative %: 44 %
Platelets: 167 10*3/uL (ref 150–400)
RBC: 2.97 MIL/uL — ABNORMAL LOW (ref 3.87–5.11)
RDW: 17.4 % — ABNORMAL HIGH (ref 11.5–15.5)
WBC: 3.9 10*3/uL — ABNORMAL LOW (ref 4.0–10.5)
nRBC: 0 % (ref 0.0–0.2)

## 2024-06-15 LAB — COMPREHENSIVE METABOLIC PANEL WITH GFR
ALT: 9 U/L (ref 0–44)
AST: 15 U/L (ref 15–41)
Albumin: 2.8 g/dL — ABNORMAL LOW (ref 3.5–5.0)
Alkaline Phosphatase: 99 U/L (ref 38–126)
Anion gap: 9 (ref 5–15)
BUN: 12 mg/dL (ref 8–23)
CO2: 21 mmol/L — ABNORMAL LOW (ref 22–32)
Calcium: 8.6 mg/dL — ABNORMAL LOW (ref 8.9–10.3)
Chloride: 110 mmol/L (ref 98–111)
Creatinine, Ser: 1.09 mg/dL — ABNORMAL HIGH (ref 0.44–1.00)
GFR, Estimated: 50 mL/min — ABNORMAL LOW (ref >60)
Glucose, Bld: 139 mg/dL — ABNORMAL HIGH (ref 70–99)
Potassium: 2.8 mmol/L — ABNORMAL LOW (ref 3.5–5.1)
Sodium: 140 mmol/L (ref 135–145)
Total Bilirubin: 0.9 mg/dL (ref 0.0–1.2)
Total Protein: 6.2 g/dL — ABNORMAL LOW (ref 6.5–8.1)

## 2024-06-15 LAB — MAGNESIUM: Magnesium: 1.2 mg/dL — ABNORMAL LOW (ref 1.7–2.4)

## 2024-06-15 MED ORDER — SODIUM CHLORIDE 0.9 % IV SOLN: INTRAVENOUS | Status: DC

## 2024-06-15 MED ORDER — PROCHLORPERAZINE MALEATE 10 MG PO TABS
10.0000 mg | ORAL_TABLET | Freq: Once | ORAL | Status: AC
  Administered 2024-06-15: 10 mg via ORAL
  Filled 2024-06-15: qty 1

## 2024-06-15 MED ORDER — MAGNESIUM SULFATE 4 GM/100ML IV SOLN
4.0000 g | Freq: Once | INTRAVENOUS | Status: AC
  Administered 2024-06-15: 4 g via INTRAVENOUS
  Filled 2024-06-15: qty 100

## 2024-06-15 MED ORDER — DEXAMETHASONE 4 MG PO TABS
20.0000 mg | ORAL_TABLET | Freq: Once | ORAL | Status: AC
  Administered 2024-06-15: 20 mg via ORAL
  Filled 2024-06-15: qty 5

## 2024-06-15 MED ORDER — BORTEZOMIB CHEMO SQ INJECTION 3.5 MG (2.5MG/ML)
1.3000 mg/m2 | Freq: Once | INTRAMUSCULAR | Status: AC
  Administered 2024-06-15: 2.25 mg via SUBCUTANEOUS
  Filled 2024-06-15: qty 0.9

## 2024-06-15 MED ORDER — EPOETIN ALFA-EPBX 20000 UNIT/ML IJ SOLN
20000.0000 [IU] | Freq: Once | INTRAMUSCULAR | Status: AC
  Administered 2024-06-15: 20000 [IU] via SUBCUTANEOUS
  Filled 2024-06-15: qty 1

## 2024-06-15 MED ORDER — POTASSIUM CHLORIDE CRYS ER 20 MEQ PO TBCR
40.0000 meq | EXTENDED_RELEASE_TABLET | Freq: Two times a day (BID) | ORAL | Status: DC
  Administered 2024-06-15: 40 meq via ORAL
  Filled 2024-06-15: qty 2

## 2024-06-15 NOTE — Progress Notes (Signed)
 Vecade injection and 4 grams Magnesium  Sulfate given today per MD orders. Tolerated infusion without adverse affects. Vital signs stable. No complaints at this time. Discharged from clinic by wheel chair accompanied by care giver in stable condition. Alert and oriented x 3. F/U with [facility] as scheduled.

## 2024-06-15 NOTE — Progress Notes (Signed)
 Patient presents today for Velcade  and Retacrit  injection per providers order.  Vital signs and labs within parameters fro treatment.  Hgb 9.8, Potassium 2.8 and Magnesium  1.2, per standing order patient will receive 40 mEq PO Potassium and 4 grams IV magnesium .  Stable during administration without incident; injection sites WNL; see MAR for injection details.  Patient tolerated procedure well and without incident.   Kadcyla infusion without adverse affects.  Vital signs stable.  No complaints at this time.  Discharge from clinic ambulatory in stable condition.  Alert and oriented X 3.  Follow up with [facility] as scheduled.

## 2024-06-15 NOTE — Patient Instructions (Signed)
 [facility] - A DEPT OF [facility]  Discharge Instructions: Thank you for choosing [facility] to provide your oncology and hematology care.  If you have a lab appointment with the Cancer Center - please note that after April 8th, 2024, all labs will be drawn in the cancer center.  You do not have to check in or register with the main entrance as you have in the past but will complete your check-in in the cancer center.  Wear comfortable clothing and clothing appropriate for easy access to any Portacath or PICC line.   We strive to give you quality time with your provider. You may need to reschedule your appointment if you arrive late (15 or more minutes).  Arriving late affects you and other patients whose appointments are after yours.  Also, if you miss three or more appointments without notifying the office, you may be dismissed from the clinic at the provider's discretion.      For prescription refill requests, have your pharmacy contact our office and allow 72 hours for refills to be completed.    Today you received the following chemotherapy and/or immunotherapy agents Velcade  Retacrit  Magnesium  Potassium      To help prevent nausea and vomiting after your treatment, we encourage you to take your nausea medication as directed.  BELOW ARE SYMPTOMS THAT SHOULD BE REPORTED IMMEDIATELY: *FEVER GREATER THAN 100.4 F (38 C) OR HIGHER *CHILLS OR SWEATING *NAUSEA AND VOMITING THAT IS NOT CONTROLLED WITH YOUR NAUSEA MEDICATION *UNUSUAL SHORTNESS OF BREATH *UNUSUAL BRUISING OR BLEEDING *URINARY PROBLEMS (pain or burning when urinating, or frequent urination) *BOWEL PROBLEMS (unusual diarrhea, constipation, pain near the anus) TENDERNESS IN MOUTH AND THROAT WITH OR WITHOUT PRESENCE OF ULCERS (sore throat, sores in mouth, or a toothache) UNUSUAL RASH, SWELLING OR PAIN  UNUSUAL VAGINAL DISCHARGE OR ITCHING   Items with * indicate a potential  emergency and should be followed up as soon as possible or go to the Emergency Department if any problems should occur.  Please show the CHEMOTHERAPY ALERT CARD or IMMUNOTHERAPY ALERT CARD at check-in to the Emergency Department and triage nurse.  Should you have questions after your visit or need to cancel or reschedule your appointment, please contact [facility] - A DEPT OF [facility] [phone]  and follow the prompts.  Office hours are 8:00 a.m. to 4:30 p.m. Monday - Friday. Please note that voicemails left after 4:00 p.m. may not be returned until the following business day.  We are closed weekends and major holidays. You have access to a nurse at all times for urgent questions. Please call the main number to the clinic [phone] and follow the prompts.  For any non-urgent questions, you may also contact your provider using MyChart. We now offer e-Visits for anyone 12 and older to request care online for non-urgent symptoms. For details visit [email or web address].   Also download the MyChart app! Go to the app store, search MyChart, open the app, select [facility], and log in with your MyChart username and password.

## 2024-06-19 ENCOUNTER — Other Ambulatory Visit: Payer: Self-pay | Admitting: Hematology

## 2024-06-19 ENCOUNTER — Other Ambulatory Visit: Payer: Self-pay

## 2024-06-19 DIAGNOSIS — C9000 Multiple myeloma not having achieved remission: Secondary | ICD-10-CM

## 2024-06-22 ENCOUNTER — Other Ambulatory Visit: Payer: Self-pay | Admitting: *Deleted

## 2024-06-22 DIAGNOSIS — C9000 Multiple myeloma not having achieved remission: Secondary | ICD-10-CM

## 2024-06-22 MED ORDER — LENALIDOMIDE 10 MG PO CAPS: 10.0000 mg | ORAL_CAPSULE | Freq: Every day | ORAL | 0 refills | Status: DC

## 2024-06-27 ENCOUNTER — Other Ambulatory Visit: Payer: Self-pay

## 2024-06-28 ENCOUNTER — Other Ambulatory Visit: Payer: Self-pay

## 2024-06-28 DIAGNOSIS — C9000 Multiple myeloma not having achieved remission: Secondary | ICD-10-CM

## 2024-06-28 DIAGNOSIS — C50212 Malignant neoplasm of upper-inner quadrant of left female breast: Secondary | ICD-10-CM

## 2024-06-29 ENCOUNTER — Inpatient Hospital Stay: Attending: Hematology

## 2024-06-29 ENCOUNTER — Inpatient Hospital Stay

## 2024-06-29 ENCOUNTER — Inpatient Hospital Stay: Admitting: Hematology

## 2024-06-29 ENCOUNTER — Other Ambulatory Visit: Payer: Self-pay | Admitting: Pharmacist

## 2024-06-29 VITALS — Wt 153.8 lb

## 2024-06-29 VITALS — BP 105/67 | HR 72 | Temp 97.8°F | Resp 18 | Wt 152.9 lb

## 2024-06-29 DIAGNOSIS — C9000 Multiple myeloma not having achieved remission: Secondary | ICD-10-CM | POA: Diagnosis present

## 2024-06-29 DIAGNOSIS — Z5112 Encounter for antineoplastic immunotherapy: Secondary | ICD-10-CM | POA: Insufficient documentation

## 2024-06-29 DIAGNOSIS — Z170 Estrogen receptor positive status [ER+]: Secondary | ICD-10-CM

## 2024-06-29 LAB — CBC WITH DIFFERENTIAL/PLATELET
Abs Immature Granulocytes: 0 10*3/uL (ref 0.00–0.07)
Basophils Absolute: 0 10*3/uL (ref 0.0–0.1)
Basophils Relative: 1 %
Eosinophils Absolute: 0.1 10*3/uL (ref 0.0–0.5)
Eosinophils Relative: 4 %
HCT: 33.6 % — ABNORMAL LOW (ref 36.0–46.0)
Hemoglobin: 10.8 g/dL — ABNORMAL LOW (ref 12.0–15.0)
Immature Granulocytes: 0 %
Lymphocytes Relative: 42 %
Lymphs Abs: 1.5 10*3/uL (ref 0.7–4.0)
MCH: 33.2 pg (ref 26.0–34.0)
MCHC: 32.1 g/dL (ref 30.0–36.0)
MCV: 103.4 fL — ABNORMAL HIGH (ref 80.0–100.0)
Monocytes Absolute: 0.3 10*3/uL (ref 0.1–1.0)
Monocytes Relative: 8 %
Neutro Abs: 1.6 10*3/uL — ABNORMAL LOW (ref 1.7–7.7)
Neutrophils Relative %: 45 %
Platelets: 186 10*3/uL (ref 150–400)
RBC: 3.25 MIL/uL — ABNORMAL LOW (ref 3.87–5.11)
RDW: 18.1 % — ABNORMAL HIGH (ref 11.5–15.5)
WBC: 3.6 10*3/uL — ABNORMAL LOW (ref 4.0–10.5)
nRBC: 0 % (ref 0.0–0.2)

## 2024-06-29 LAB — MAGNESIUM: Magnesium: 1.8 mg/dL (ref 1.7–2.4)

## 2024-06-29 LAB — COMPREHENSIVE METABOLIC PANEL WITH GFR
ALT: 15 U/L (ref 0–44)
AST: 22 U/L (ref 15–41)
Albumin: 3.1 g/dL — ABNORMAL LOW (ref 3.5–5.0)
Alkaline Phosphatase: 129 U/L — ABNORMAL HIGH (ref 38–126)
Anion gap: 9 (ref 5–15)
BUN: 13 mg/dL (ref 8–23)
CO2: 23 mmol/L (ref 22–32)
Calcium: 9.2 mg/dL (ref 8.9–10.3)
Chloride: 107 mmol/L (ref 98–111)
Creatinine, Ser: 1.18 mg/dL — ABNORMAL HIGH (ref 0.44–1.00)
GFR, Estimated: 46 mL/min — ABNORMAL LOW (ref >60)
Glucose, Bld: 124 mg/dL — ABNORMAL HIGH (ref 70–99)
Potassium: 4 mmol/L (ref 3.5–5.1)
Sodium: 139 mmol/L (ref 135–145)
Total Bilirubin: 0.7 mg/dL (ref 0.0–1.2)
Total Protein: 6.5 g/dL (ref 6.5–8.1)

## 2024-06-29 MED ORDER — DEXAMETHASONE 4 MG PO TABS
20.0000 mg | ORAL_TABLET | Freq: Once | ORAL | Status: AC
  Administered 2024-06-29: 20 mg via ORAL
  Filled 2024-06-29: qty 5

## 2024-06-29 MED ORDER — BORTEZOMIB CHEMO SQ INJECTION 3.5 MG (2.5MG/ML)
1.3000 mg/m2 | Freq: Once | INTRAMUSCULAR | Status: AC
  Administered 2024-06-29: 2.25 mg via SUBCUTANEOUS
  Filled 2024-06-29: qty 0.9

## 2024-06-29 MED ORDER — PROCHLORPERAZINE MALEATE 10 MG PO TABS
10.0000 mg | ORAL_TABLET | Freq: Once | ORAL | Status: AC
  Administered 2024-06-29: 10 mg via ORAL
  Filled 2024-06-29: qty 1

## 2024-06-29 NOTE — Patient Instructions (Signed)
 [facility] [place] - A DEPT OF [facility]  Discharge Instructions: Thank you for choosing [facility] to provide your oncology and hematology care.  If you have a lab appointment with the Cancer Center - please note that after April 8th, 2024, all labs will be drawn in the cancer center.  You do not have to check in or register with the main entrance as you have in the past but will complete your check-in in the cancer center.  Wear comfortable clothing and clothing appropriate for easy access to any Portacath or PICC line.   We strive to give you quality time with your provider. You may need to reschedule your appointment if you arrive late (15 or more minutes).  Arriving late affects you and other patients whose appointments are after yours.  Also, if you miss three or more appointments without notifying the office, you may be dismissed from the clinic at the provider's discretion.      For prescription refill requests, have your pharmacy contact our office and allow 72 hours for refills to be completed.    Today you received the following chemotherapy and/or immunotherapy agents Velcade, return as scheduled.   To help prevent nausea and vomiting after your treatment, we encourage you to take your nausea medication as directed.  BELOW ARE SYMPTOMS THAT SHOULD BE REPORTED IMMEDIATELY: *FEVER GREATER THAN 100.4 F (38 C) OR HIGHER *CHILLS OR SWEATING *NAUSEA AND VOMITING THAT IS NOT CONTROLLED WITH YOUR NAUSEA MEDICATION *UNUSUAL SHORTNESS OF BREATH *UNUSUAL BRUISING OR BLEEDING *URINARY PROBLEMS (pain or burning when urinating, or frequent urination) *BOWEL PROBLEMS (unusual diarrhea, constipation, pain near the anus) TENDERNESS IN MOUTH AND THROAT WITH OR WITHOUT PRESENCE OF ULCERS (sore throat, sores in mouth, or a toothache) UNUSUAL RASH, SWELLING OR PAIN  UNUSUAL VAGINAL DISCHARGE OR ITCHING   Items with * indicate a potential emergency and  should be followed up as soon as possible or go to the Emergency Department if any problems should occur.  Please show the CHEMOTHERAPY ALERT CARD or IMMUNOTHERAPY ALERT CARD at check-in to the Emergency Department and triage nurse.  Should you have questions after your visit or need to cancel or reschedule your appointment, please contact [facility] [place] - A DEPT OF [facility] [phone]  and follow the prompts.  Office hours are 8:00 a.m. to 4:30 p.m. Monday - Friday. Please note that voicemails left after 4:00 p.m. may not be returned until the following business day.  We are closed weekends and major holidays. You have access to a nurse at all times for urgent questions. Please call the main number to the clinic [phone] and follow the prompts.  For any non-urgent questions, you may also contact your provider using MyChart. We now offer e-Visits for anyone 68 and older to request care online for non-urgent symptoms. For details visit [email or web address].   Also download the MyChart app! Go to the app store, search "MyChart", open the app, select [place], and log in with your MyChart username and password.

## 2024-06-29 NOTE — Patient Instructions (Signed)

## 2024-06-29 NOTE — Progress Notes (Signed)
 Patient tolerated Velcade injection with no complaints voiced.  Lab work reviewed.  See MAR for details.  Injection site clean and dry with no bruising or swelling noted.  Patient stable during and after injection.  Band aid applied.  VSS.  Patient left in satisfactory condition with no s/s of distress noted.

## 2024-06-29 NOTE — Progress Notes (Signed)
 Patient has been examined by Dr. Ellin Saba. Vital signs and labs have been reviewed by MD - ANC, Creatinine, LFTs, hemoglobin, and platelets are within treatment parameters per M.D. - pt may proceed with treatment.  Primary RN and pharmacy notified.

## 2024-06-29 NOTE — Progress Notes (Signed)
 [facility] [address] [place] [address]   Clinic Day:  06/29/2024  Referring physician: Katrinka Aquas, MD  Patient Care Team: Katrinka Aquas, MD as PCP - General (Internal Medicine) Rogers Hai, MD as Medical Oncologist (Hematology)   ASSESSMENT & PLAN:   Assessment:  1.  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  In the left axilla there is mild tracer avid lymph node measuring 5 mm with SUV 2.3.  In the left breast there is a tracer avid soft tissue nodule within the periareolar region measuring 1.9 cm SUV 6.1.  No tracer avid mediastinal or hilar lymph nodes.  No bone lesions. - Diagnostic mammogram/ultrasound (03/10/2023): Suspicious palpable 2.2 cm mass in the left breast at 9:00 retroareolar.  Suspicious 0.7 cm mass/node in the left axilla. - Left breast 9:00 retroareolar mass biopsy (03/24/2021): IDC, grade 2, ER 95% strong, PR 60% strong, HER2 (0), Ki-67 15% - Left axillary lymph node biopsy: Metastatic carcinoma  2.  Right breast cancer, ER/PR+ G2 HER2 (1+): - PET scan done for multiple myeloma showed incidental breast mass. - Mammogram/ultrasound (03/09/2024): New segmental linear oriented pleomorphic type calcifications spanning 8.6 cm in the outer right breast. - Right breast biopsy outer posterior calcifications (03/16/2024): DCIS, grade 2. - Right breast biopsy outer anterior calcifications (03/16/2024): IDC, grade 2, ER 100% strong, PR 60% strong, HER2 (1+), Ki-67 10% - Ideally we would approach this with lumpectomy/mastectomy and lymph node biopsy bilaterally.  However because of recent worsening of multiple myeloma, definitive breast cancer management was put on back burner.  3.  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  Social/family history: - She lives at home with her sister and sister's husband.  She is independent of ADLs and some IADLs.  Her 2 children are deceased. - Sister had breast and colon cancer.  Mother had cancer, type unknown to the patient.  Brother died of kidney cancer recently.  Another sister had breast cancer.   Plan:  1.  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  Continue anastrozole  which she is tolerating well.  2.  IgA kappa multiple myeloma, standard risk: - Last myeloma labs on 03/09/2024: M spike is 0.5 g.  FLC ratio improved to 2.89 and improvement of kappa light chains to 113.8. - Recent hospitalization from 05/27/2024 through 05/30/2024 with closed compression fracture of L2 vertebral body.  She is on oxycodone  5 mg every 4 hours as needed.  Neurosurgery was consulted and was recommended TLSO. - Labs today: Normal LFTs and creatinine.  CBC is grossly normal. - Will send myeloma labs today.  Proceed with Velcade  every 2 weeks.  Continue Revlimid  10 mg 3 weeks on/1 week off.  She had 1 week delay in obtaining the medicine.  RTC 2 weeks to discuss results.  3.  Severe hypokalemia/hypomagnesemia: - Continue potassium 3 times daily and magnesium  3 times daily.  Both are normal today.  4.  Osteomyelitis of the right mandible/dental abscess: - Denosumab  is on hold indefinitely.  5.  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  Hemoglobin is 10.8.  Orders Placed This Encounter  Procedures   Kappa/lambda light chains    Standing Status:   Future    Number of Occurrences:   1    Expected Date:   06/29/2024    Expiration Date:   09/27/2024   Protein electrophoresis, serum    Standing Status:   Future    Number of Occurrences:   1    Expected Date:   06/29/2024    Expiration Date:    09/27/2024     LILLETTE Hummingbird R Teague,acting as a scribe for Alean Stands, MD.,have documented all relevant documentation on the behalf of Alean Stands, MD,as directed by  Alean Stands, MD while in the presence of Alean Stands, MD.  I, Alean Stands MD, have reviewed the above documentation for accuracy and completeness, and I agree with the above.     Alean Stands, MD   7/3/20252:40 PM  CHIEF COMPLAINT/PURPOSE OF CONSULT:   Diagnosis: Bilateral ER positive breast cancer, multiple myeloma   Cancer Staging  Bilateral breast cancer [facility]) Staging form: Breast, AJCC 8th Edition - Clinical stage from 03/30/2024: Stage IIA (cT3, cN0, cM0, G2, ER+, PR+, HER2-) - Unsigned  Breast cancer of upper-inner quadrant of left female breast (HCC) Staging form: Breast, AJCC 8th Edition - Clinical stage from 03/30/2024: Stage IIA (cT2, cN1, cM0, G2, ER+, PR+, HER2-) - Unsigned    Prior Therapy: None  Current Therapy: Anastrozole    HISTORY OF PRESENT ILLNESS:   Oncology History  Multiple myeloma not having achieved remission (HCC)  01/20/2018 Initial Diagnosis   Multiple myeloma not having achieved remission (HCC)   01/26/2018 - 09/01/2022 Chemotherapy   Patient is on Treatment Plan : MYELOMA  RVD SQ (Bortezomib  d 1,8,15 ) q28d x 4 cycles     04/13/2022 -  Chemotherapy   Patient is on Treatment Plan : MYELOMA MAINTENANCE Bortezomib  SQ q14d         Zelina is a 83 y.o. female presenting to clinic today for evaluation of newly diagnosed bilateral breast cancer at the request of breast center.  She was found to have incidental breast mass on PET scan for myeloma.  This was followed by diagnostic mammogram and ultrasound on 03/09/2024 and biopsies of both breasts on 03/24/2024.  She is here to discuss results and further plan with her sister.  She attained menarche around age 50.  She had TAH in her 12s.  She had 2 children who are deceased now.  Age at first  childbirth was not clear but in her teens.  Never received hormone replacement therapy or birth control pills.  She had previous history of left breast DCIS, underwent lumpectomy in August 2008 and radiation therapy.  Today, she states that she is doing well overall. Her appetite level is at 60%. Her energy level is at 20%.  INTERVAL HISTORY:   AIME CARRERAS is a 83 y.o. female presenting to the clinic today for follow-up of bilateral breast cancer and multiple myeloma. She was last seen by me on 05/11/24.  Since her last visit, she was admitted to the hospital from 05/27/24 to 05/30/24 for  L2 vertebra fracture and Klebsiella UTI, treated with oxycodone  and cephalexin .   Today, she states that she is doing well overall. Her appetite level is at 100%. Her energy level is at 70%. Aijah is accompanied by her daughter.   She is currently wearing a back brace, which she uses as needed for back pain. She is taking Anastrozole  as prescribed. Brittany Archer has been  off of Revlimid  for 2 weeks due to the rehab center losing her medication for 1 week and then having her off week this week. She denies any peripheral neuropathy.   PAST MEDICAL HISTORY:   Past Medical History: Past Medical History:  Diagnosis Date   Breast cancer (HCC)    left breast/ 2008/ surg/ rad tx   Coronary artery disease    Diabetes mellitus     Surgical History: Past Surgical History:  Procedure Laterality Date   ABDOMINAL HYSTERECTOMY     BREAST BIOPSY Left 03/24/2024   US  LT BREAST BX W LOC DEV 1ST LESION IMG BX SPEC US  GUIDE 03/24/2024 [facility]   BREAST BIOPSY Left 03/24/2024   US  LT BREAST BX W LOC DEV EA ADD LESION IMG BX SPEC US  GUIDE 03/24/2024 [facility]   BREAST BIOPSY Right 03/24/2024   MM RT BREAST BX W LOC DEV EA AD LESION IMG BX SPEC STEREO GUIDE 03/24/2024 [facility]   BREAST BIOPSY Right 03/24/2024   MM RT BREAST BX W LOC DEV 1ST LESION IMAGE BX SPEC STEREO GUIDE 03/24/2024 [facility]   BREAST SURGERY     DEBRIDEMENT MANDIBLE N/A 02/22/2020   Procedure: INCISION AND DRAINAGE WITH DEBRIDEMENT MANDIBLE;  Surgeon: Joanette Soulier, DMD;  Location: [facility];  Service: Oral Surgery;  Laterality: N/A;   DEBRIDEMENT MANDIBLE Right 03/19/2021   Procedure: DEBRIDEMENT OF BONE RIGHT INTERIOR  MANDIBLE;  Surgeon: Joanette Soulier, DMD;  Location: [facility];  Service: Oral Surgery;  Laterality: Right;   EYE SURGERY  2021   cataract removals    TOOTH EXTRACTION N/A 02/22/2020   Procedure: DENTAL RESTORATION/EXTRACTIONS;  Surgeon: Joanette Soulier, DMD;  Location: [facility];  Service: Oral Surgery;  Laterality: N/A;  DENTAL KIT REQUESTED    Social History: Social History   Socioeconomic History   Marital status: Divorced    Spouse name: Not on file   Number of children: Not on file   Years of education: Not on file   Highest education level: Not on file  Occupational History   Not on file  Tobacco Use   Smoking status: Never   Smokeless tobacco: Never  Vaping Use   Vaping status: Never Used  Substance and Sexual Activity   Alcohol use: No   Drug use: No   Sexual activity: Yes    Birth control/protection: Surgical  Other Topics Concern   Not on file  Social History Narrative   Not on file   Social Drivers of Health   Financial Resource Strain: Low Risk  (12/09/2020)   Overall Financial Resource Strain (CARDIA)    Difficulty of Paying Living Expenses: Not hard at all  Food Insecurity: No Food Insecurity (05/27/2024)   Hunger Vital Sign    Worried About Running Out of Food in the Last Year: Never true    Ran Out of Food in the Last Year: Never true  Transportation Needs: No Transportation Needs (05/27/2024)   PRAPARE - Administrator, Civil Service (Medical): No    Lack of Transportation (Non-Medical): No  Physical Activity: Inactive (12/09/2020)   Exercise Vital Sign    Days of Exercise per Week: 0 days    Minutes of Exercise per Session: 0 min  Stress: No  Stress Concern Present (12/09/2020)   [employer] of Occupational Health - Occupational Stress Questionnaire    Feeling of Stress : Not at all  Social Connections: Moderately Isolated (05/27/2024)   Social Connection and Isolation Panel  Frequency of Communication with Friends and Family: More than three times a week    Frequency of Social Gatherings with Friends and Family: More than three times a week    Attends Religious Services: More than 4 times per year    Active Member of [employer] or Organizations: No    Attends Banker Meetings: Never    Marital Status: Divorced  Catering manager Violence: Not At Risk (05/27/2024)   Humiliation, Afraid, Rape, and Kick questionnaire    Fear of Current or Ex-Partner: No    Emotionally Abused: No    Physically Abused: No    Sexually Abused: No    Family History: Family History  Problem Relation Age of Onset   Obesity Sister    Breast cancer Sister        dx 71s   Breast cancer Sister    Kidney cancer Brother     Current Medications:  Current Outpatient Medications:    acetaminophen  (TYLENOL ) 500 MG tablet, Take 2 tablets (1,000 mg total) by mouth every 8 (eight) hours., Disp: , Rfl:    anastrozole  (ARIMIDEX ) 1 MG tablet, Take 1 tablet (1 mg total) by mouth daily., Disp: 30 tablet, Rfl: 6   aspirin  81 MG tablet, Take 1 tablet (81 mg total) by mouth daily with breakfast., Disp: 30 tablet, Rfl: 5   atorvastatin  (LIPITOR) 10 MG tablet, Take 10 mg by mouth daily., Disp: , Rfl:    bortezomib  IV (VELCADE ) 3.5 MG injection, 3.5 mg once a week. weekly, Disp: , Rfl:    cephALEXin  (KEFLEX ) 500 MG capsule, Take 1 capsule (500 mg total) by mouth every 8 (eight) hours. X 3 days, Disp: , Rfl:    famotidine  (PEPCID ) 20 MG tablet, Take 20 mg by mouth at bedtime as needed., Disp: , Rfl:    glucose blood (ACCU-CHEK AVIVA PLUS) test strip, CHECK BLOOD SUGAR ONCE DAILY, Disp: , Rfl:    lenalidomide  (REVLIMID ) 10 MG capsule, Take 1 capsule (10  mg total) by mouth daily. Take daily for 21 days, with 7 days off, Disp: 21 capsule, Rfl: 0   magnesium  oxide (MAG-OX) 400 (240 Mg) MG tablet, TAKE ONE TABLET BY MOUTH THREE TIMES A DAY, Disp: 90 tablet, Rfl: 3   metFORMIN  (GLUCOPHAGE -XR) 500 MG 24 hr tablet, SMARTSIG:1 Tablet(s) By Mouth Every Evening, Disp: , Rfl:    oxyCODONE  (OXY IR/ROXICODONE ) 5 MG immediate release tablet, Take 1 tablet (5 mg total) by mouth every 4 (four) hours as needed for moderate pain (pain score 4-6) or severe pain (pain score 7-10)., Disp: 12 tablet, Rfl: 0   pantoprazole  (PROTONIX ) 40 MG tablet, Take 1 tablet (40 mg total) by mouth daily., Disp: , Rfl:    potassium chloride  SA (KLOR-CON  M) 20 MEQ tablet, Take 20 mEq by mouth 2 (two) times daily., Disp: , Rfl:    memantine (NAMENDA) 5 MG tablet, TAKE ONE TABLET BY MOUTH ONCE DAILY Oral; Duration: 30 Days, Disp: , Rfl:    ondansetron  (ZOFRAN ) 4 MG tablet, TAKE ONE TABLET BY MOUTH EVERY 6 HOURS AS NEEDED FOR NAUSEA AND VOMITING Oral; Duration: 8 Days, Disp: , Rfl:    potassium chloride  (KLOR-CON ) 20 MEQ packet, Take by mouth., Disp: , Rfl:  No current facility-administered medications for this visit.  Facility-Administered Medications Ordered in Other Visits:    bortezomib  SQ (VELCADE ) chemo injection (2.5mg /mL concentration) 2.25 mg, 1.3 mg/m2 (Treatment Plan Recorded), Subcutaneous, Once, Rogers Hai, MD   Allergies: Allergies  Allergen Reactions   Motrin [Ibuprofen]  Rash   Seasonal Ic [Cholestatin] Other (See Comments)    Sneezing, watery eyes    REVIEW OF SYSTEMS:   Review of Systems  Constitutional:  Negative for chills, fatigue and fever.  HENT:   Negative for lump/mass, mouth sores, nosebleeds, sore throat and trouble swallowing.   Eyes:  Negative for eye problems.  Respiratory:  Positive for cough. Negative for shortness of breath.   Cardiovascular:  Negative for chest pain, leg swelling and palpitations.  Gastrointestinal:  Negative for  abdominal pain, constipation, diarrhea, nausea and vomiting.  Genitourinary:  Negative for bladder incontinence, difficulty urinating, dysuria, frequency, hematuria and nocturia.   Musculoskeletal:  Positive for back pain (5/10 severity). Negative for arthralgias, flank pain, myalgias and neck pain.  Skin:  Negative for itching and rash.  Neurological:  Negative for dizziness, headaches and numbness.  Hematological:  Does not bruise/bleed easily.  Psychiatric/Behavioral:  Negative for depression, sleep disturbance and suicidal ideas. The patient is not nervous/anxious.   All other systems reviewed and are negative.    VITALS:   Blood pressure 105/67, pulse 72, temperature 97.8 F (36.6 C), temperature source Tympanic, resp. rate 18, weight 152 lb 14.4 oz (69.4 kg), SpO2 100%.  Wt Readings from Last 3 Encounters:  06/29/24 153 lb 12.8 oz (69.8 kg)  06/29/24 152 lb 14.4 oz (69.4 kg)  06/15/24 146 lb 14.4 oz (66.6 kg)    Body mass index is 26.25 kg/m.  Performance status (ECOG): 1 - Symptomatic but completely ambulatory  PHYSICAL EXAM:   Physical Exam Vitals and nursing note reviewed. Exam conducted with a chaperone present.  Constitutional:      Appearance: Normal appearance.  Cardiovascular:     Rate and Rhythm: Normal rate and regular rhythm.     Pulses: Normal pulses.     Heart sounds: Normal heart sounds.  Pulmonary:     Effort: Pulmonary effort is normal.     Breath sounds: Normal breath sounds.  Abdominal:     Palpations: Abdomen is soft. There is no hepatomegaly, splenomegaly or mass.     Tenderness: There is no abdominal tenderness.  Musculoskeletal:     Right lower leg: No edema.     Left lower leg: No edema.  Lymphadenopathy:     Cervical: No cervical adenopathy.     Right cervical: No superficial, deep or posterior cervical adenopathy.    Left cervical: No superficial, deep or posterior cervical adenopathy.     Upper Body:     Right upper body: No  supraclavicular or axillary adenopathy.     Left upper body: No supraclavicular or axillary adenopathy.  Neurological:     General: No focal deficit present.     Mental Status: She is alert and oriented to person, place, and time.  Psychiatric:        Mood and Affect: Mood normal.        Behavior: Behavior normal.     LABS:   CBC    Component Value Date/Time   WBC 3.6 (L) 06/29/2024 1102   RBC 3.25 (L) 06/29/2024 1102   HGB 10.8 (L) 06/29/2024 1102   HCT 33.6 (L) 06/29/2024 1102   PLT 186 06/29/2024 1102   MCV 103.4 (H) 06/29/2024 1102   MCH 33.2 06/29/2024 1102   MCHC 32.1 06/29/2024 1102   RDW 18.1 (H) 06/29/2024 1102   LYMPHSABS 1.5 06/29/2024 1102   MONOABS 0.3 06/29/2024 1102   EOSABS 0.1 06/29/2024 1102   BASOSABS 0.0 06/29/2024 1102  CMP    Component Value Date/Time   NA 139 06/29/2024 1102   K 4.0 06/29/2024 1102   CL 107 06/29/2024 1102   CO2 23 06/29/2024 1102   GLUCOSE 124 (H) 06/29/2024 1102   BUN 13 06/29/2024 1102   CREATININE 1.18 (H) 06/29/2024 1102   CALCIUM  9.2 06/29/2024 1102   PROT 6.5 06/29/2024 1102   ALBUMIN 3.1 (L) 06/29/2024 1102   AST 22 06/29/2024 1102   ALT 15 06/29/2024 1102   ALKPHOS 129 (H) 06/29/2024 1102   BILITOT 0.7 06/29/2024 1102   GFRNONAA 46 (L) 06/29/2024 1102   GFRAA >60 09/30/2020 1225     No results found for: CEA1, CEA / No results found for: CEA1, CEA No results found for: PSA1 No results found for: CAN199 No results found for: RJW874  Lab Results  Component Value Date   TOTALPROTELP 6.6 03/09/2024   ALBUMINELP 2.9 03/09/2024   A1GS 0.2 03/09/2024   A2GS 1.0 03/09/2024   BETS 1.4 (H) 03/09/2024   GAMS 1.2 03/09/2024   MSPIKE 0.5 (H) 03/09/2024   SPEI Comment 03/09/2024   Lab Results  Component Value Date   TIBC 120 (L) 04/20/2024   TIBC 137 (L) 05/20/2023   TIBC 117 (L) 03/24/2023   FERRITIN 306 04/20/2024   FERRITIN 267 05/20/2023   FERRITIN 257 03/24/2023   IRONPCTSAT 23  04/20/2024   IRONPCTSAT 33 (H) 05/20/2023   IRONPCTSAT 44 (H) 03/24/2023   Lab Results  Component Value Date   LDH 137 05/20/2023   LDH 134 03/24/2023   LDH 125 01/26/2023     STUDIES:   No results found.

## 2024-06-30 LAB — PROTEIN ELECTROPHORESIS, SERUM
A/G Ratio: 1.1 (ref 0.7–1.7)
Albumin ELP: 3 g/dL (ref 2.9–4.4)
Alpha-1-Globulin: 0.2 g/dL (ref 0.0–0.4)
Alpha-2-Globulin: 0.7 g/dL (ref 0.4–1.0)
Beta Globulin: 1.1 g/dL (ref 0.7–1.3)
Gamma Globulin: 0.9 g/dL (ref 0.4–1.8)
Globulin, Total: 2.8 g/dL (ref 2.2–3.9)
M-Spike, %: 0.4 g/dL — ABNORMAL HIGH
Total Protein ELP: 5.8 g/dL — ABNORMAL LOW (ref 6.0–8.5)

## 2024-07-03 LAB — KAPPA/LAMBDA LIGHT CHAINS
Kappa free light chain: 112.5 mg/L — ABNORMAL HIGH (ref 3.3–19.4)
Kappa, lambda light chain ratio: 5.74 — ABNORMAL HIGH (ref 0.26–1.65)
Lambda free light chains: 19.6 mg/L (ref 5.7–26.3)

## 2024-07-13 ENCOUNTER — Inpatient Hospital Stay: Admitting: Hematology

## 2024-07-13 ENCOUNTER — Inpatient Hospital Stay

## 2024-07-14 ENCOUNTER — Inpatient Hospital Stay (HOSPITAL_COMMUNITY)
Admission: EM | Admit: 2024-07-14 | Discharge: 2024-07-16 | DRG: 552 | Disposition: A | Discharge status: Skilled Nursing Facility | Attending: Family Medicine | Admitting: Family Medicine

## 2024-07-14 ENCOUNTER — Emergency Department (HOSPITAL_COMMUNITY)

## 2024-07-14 ENCOUNTER — Encounter (HOSPITAL_COMMUNITY): Payer: Self-pay | Admitting: Emergency Medicine

## 2024-07-14 ENCOUNTER — Other Ambulatory Visit: Payer: Self-pay

## 2024-07-14 ENCOUNTER — Observation Stay (HOSPITAL_COMMUNITY)

## 2024-07-14 DIAGNOSIS — S32050A Wedge compression fracture of fifth lumbar vertebra, initial encounter for closed fracture: Secondary | ICD-10-CM

## 2024-07-14 DIAGNOSIS — X501XXA Overexertion from prolonged static or awkward postures, initial encounter: Secondary | ICD-10-CM | POA: Diagnosis not present

## 2024-07-14 DIAGNOSIS — N390 Urinary tract infection, site not specified: Secondary | ICD-10-CM | POA: Diagnosis present

## 2024-07-14 DIAGNOSIS — D649 Anemia, unspecified: Secondary | ICD-10-CM

## 2024-07-14 DIAGNOSIS — M4856XS Collapsed vertebra, not elsewhere classified, lumbar region, sequela of fracture: Secondary | ICD-10-CM

## 2024-07-14 DIAGNOSIS — Z79899 Other long term (current) drug therapy: Secondary | ICD-10-CM | POA: Diagnosis not present

## 2024-07-14 DIAGNOSIS — F0390 Unspecified dementia without behavioral disturbance: Secondary | ICD-10-CM | POA: Diagnosis present

## 2024-07-14 DIAGNOSIS — R82998 Other abnormal findings in urine: Secondary | ICD-10-CM

## 2024-07-14 DIAGNOSIS — I2510 Atherosclerotic heart disease of native coronary artery without angina pectoris: Secondary | ICD-10-CM | POA: Diagnosis not present

## 2024-07-14 DIAGNOSIS — R1084 Generalized abdominal pain: Secondary | ICD-10-CM | POA: Diagnosis present

## 2024-07-14 DIAGNOSIS — R4189 Other symptoms and signs involving cognitive functions and awareness: Secondary | ICD-10-CM | POA: Diagnosis not present

## 2024-07-14 DIAGNOSIS — Y9389 Activity, other specified: Secondary | ICD-10-CM

## 2024-07-14 DIAGNOSIS — N183 Chronic kidney disease, stage 3 unspecified: Secondary | ICD-10-CM | POA: Diagnosis present

## 2024-07-14 DIAGNOSIS — Z8579 Personal history of other malignant neoplasms of lymphoid, hematopoietic and related tissues: Secondary | ICD-10-CM

## 2024-07-14 DIAGNOSIS — Z8051 Family history of malignant neoplasm of kidney: Secondary | ICD-10-CM | POA: Diagnosis not present

## 2024-07-14 DIAGNOSIS — Z79811 Long term (current) use of aromatase inhibitors: Secondary | ICD-10-CM | POA: Diagnosis not present

## 2024-07-14 DIAGNOSIS — D539 Nutritional anemia, unspecified: Secondary | ICD-10-CM | POA: Diagnosis not present

## 2024-07-14 DIAGNOSIS — Z803 Family history of malignant neoplasm of breast: Secondary | ICD-10-CM

## 2024-07-14 DIAGNOSIS — C50912 Malignant neoplasm of unspecified site of left female breast: Secondary | ICD-10-CM | POA: Diagnosis not present

## 2024-07-14 DIAGNOSIS — M879 Osteonecrosis, unspecified: Secondary | ICD-10-CM | POA: Diagnosis present

## 2024-07-14 DIAGNOSIS — S32029A Unspecified fracture of second lumbar vertebra, initial encounter for closed fracture: Principal | ICD-10-CM | POA: Diagnosis present

## 2024-07-14 DIAGNOSIS — Z7984 Long term (current) use of oral hypoglycemic drugs: Secondary | ICD-10-CM

## 2024-07-14 DIAGNOSIS — N3000 Acute cystitis without hematuria: Secondary | ICD-10-CM | POA: Diagnosis present

## 2024-07-14 DIAGNOSIS — D72819 Decreased white blood cell count, unspecified: Secondary | ICD-10-CM | POA: Diagnosis present

## 2024-07-14 DIAGNOSIS — C50911 Malignant neoplasm of unspecified site of right female breast: Secondary | ICD-10-CM | POA: Diagnosis not present

## 2024-07-14 DIAGNOSIS — E876 Hypokalemia: Secondary | ICD-10-CM | POA: Diagnosis present

## 2024-07-14 DIAGNOSIS — K219 Gastro-esophageal reflux disease without esophagitis: Secondary | ICD-10-CM | POA: Diagnosis not present

## 2024-07-14 DIAGNOSIS — C9000 Multiple myeloma not having achieved remission: Secondary | ICD-10-CM | POA: Diagnosis not present

## 2024-07-14 DIAGNOSIS — E1122 Type 2 diabetes mellitus with diabetic chronic kidney disease: Secondary | ICD-10-CM | POA: Diagnosis present

## 2024-07-14 DIAGNOSIS — N1832 Chronic kidney disease, stage 3b: Secondary | ICD-10-CM | POA: Diagnosis present

## 2024-07-14 DIAGNOSIS — M810 Age-related osteoporosis without current pathological fracture: Secondary | ICD-10-CM | POA: Diagnosis not present

## 2024-07-14 DIAGNOSIS — M5459 Other low back pain: Secondary | ICD-10-CM

## 2024-07-14 DIAGNOSIS — Z7982 Long term (current) use of aspirin: Secondary | ICD-10-CM

## 2024-07-14 DIAGNOSIS — S32030A Wedge compression fracture of third lumbar vertebra, initial encounter for closed fracture: Secondary | ICD-10-CM

## 2024-07-14 DIAGNOSIS — S32020A Wedge compression fracture of second lumbar vertebra, initial encounter for closed fracture: Secondary | ICD-10-CM | POA: Diagnosis present

## 2024-07-14 DIAGNOSIS — E119 Type 2 diabetes mellitus without complications: Secondary | ICD-10-CM

## 2024-07-14 DIAGNOSIS — R102 Pelvic and perineal pain: Secondary | ICD-10-CM

## 2024-07-14 LAB — URINALYSIS, ROUTINE W REFLEX MICROSCOPIC
Bilirubin Urine: NEGATIVE
Glucose, UA: NEGATIVE mg/dL
Ketones, ur: NEGATIVE mg/dL
Nitrite: NEGATIVE
Protein, ur: NEGATIVE mg/dL
Specific Gravity, Urine: 1.01 (ref 1.005–1.030)
pH: 5 (ref 5.0–8.0)

## 2024-07-14 LAB — COMPREHENSIVE METABOLIC PANEL WITH GFR
ALT: 14 U/L (ref 0–44)
AST: 12 U/L — ABNORMAL LOW (ref 15–41)
Albumin: 2.8 g/dL — ABNORMAL LOW (ref 3.5–5.0)
Alkaline Phosphatase: 120 U/L (ref 38–126)
Anion gap: 13 (ref 5–15)
BUN: 13 mg/dL (ref 8–23)
CO2: 22 mmol/L (ref 22–32)
Calcium: 9.1 mg/dL (ref 8.9–10.3)
Chloride: 104 mmol/L (ref 98–111)
Creatinine, Ser: 0.96 mg/dL (ref 0.44–1.00)
GFR, Estimated: 58 mL/min — ABNORMAL LOW (ref >60)
Glucose, Bld: 100 mg/dL — ABNORMAL HIGH (ref 70–99)
Potassium: 3.7 mmol/L (ref 3.5–5.1)
Sodium: 139 mmol/L (ref 135–145)
Total Bilirubin: 1 mg/dL (ref 0.0–1.2)
Total Protein: 6.5 g/dL (ref 6.5–8.1)

## 2024-07-14 LAB — CBC
HCT: 30.9 % — ABNORMAL LOW (ref 36.0–46.0)
Hemoglobin: 9.8 g/dL — ABNORMAL LOW (ref 12.0–15.0)
MCH: 31.8 pg (ref 26.0–34.0)
MCHC: 31.7 g/dL (ref 30.0–36.0)
MCV: 100.3 fL — ABNORMAL HIGH (ref 80.0–100.0)
Platelets: 151 K/uL (ref 150–400)
RBC: 3.08 MIL/uL — ABNORMAL LOW (ref 3.87–5.11)
RDW: 17.5 % — ABNORMAL HIGH (ref 11.5–15.5)
WBC: 3.8 K/uL — ABNORMAL LOW (ref 4.0–10.5)
nRBC: 0 % (ref 0.0–0.2)

## 2024-07-14 LAB — GLUCOSE, CAPILLARY
Glucose-Capillary: 95 mg/dL (ref 70–99)
Glucose-Capillary: 95 mg/dL (ref 70–99)
Glucose-Capillary: 102 mg/dL — ABNORMAL HIGH (ref 70–99)

## 2024-07-14 LAB — LIPASE, BLOOD: Lipase: 27 U/L (ref 11–51)

## 2024-07-14 MED ORDER — IOHEXOL 300 MG/ML  SOLN
100.0000 mL | Freq: Once | INTRAMUSCULAR | Status: AC | PRN
  Administered 2024-07-14: 100 mL via INTRAVENOUS

## 2024-07-14 MED ORDER — ANASTROZOLE 1 MG PO TABS
1.0000 mg | ORAL_TABLET | Freq: Every day | ORAL | Status: DC
  Administered 2024-07-14 – 2024-07-16 (×3): 1 mg via ORAL
  Filled 2024-07-14 (×3): qty 1

## 2024-07-14 MED ORDER — GADOBUTROL 1 MMOL/ML IV SOLN
6.0000 mL | Freq: Once | INTRAVENOUS | Status: AC | PRN
  Administered 2024-07-14: 6 mL via INTRAVENOUS

## 2024-07-14 MED ORDER — ACETAMINOPHEN 325 MG PO TABS
650.0000 mg | ORAL_TABLET | Freq: Once | ORAL | Status: AC
  Administered 2024-07-14: 650 mg via ORAL
  Filled 2024-07-14: qty 2

## 2024-07-14 MED ORDER — TRAMADOL HCL 50 MG PO TABS
50.0000 mg | ORAL_TABLET | Freq: Four times a day (QID) | ORAL | Status: DC | PRN
  Administered 2024-07-15: 50 mg via ORAL
  Filled 2024-07-14: qty 1

## 2024-07-14 MED ORDER — INSULIN ASPART 100 UNIT/ML IJ SOLN
0.0000 [IU] | Freq: Three times a day (TID) | INTRAMUSCULAR | Status: DC
  Administered 2024-07-15: 1 [IU] via SUBCUTANEOUS
  Administered 2024-07-15: 2 [IU] via SUBCUTANEOUS
  Administered 2024-07-16: 1 [IU] via SUBCUTANEOUS

## 2024-07-14 MED ORDER — ENOXAPARIN SODIUM 40 MG/0.4ML IJ SOSY
40.0000 mg | PREFILLED_SYRINGE | INTRAMUSCULAR | Status: DC
  Administered 2024-07-14 – 2024-07-15 (×2): 40 mg via SUBCUTANEOUS
  Filled 2024-07-14 (×2): qty 0.4

## 2024-07-14 MED ORDER — ACETAMINOPHEN 650 MG RE SUPP: 650.0000 mg | Freq: Four times a day (QID) | RECTAL | Status: DC | PRN

## 2024-07-14 MED ORDER — TRAMADOL HCL 50 MG PO TABS
50.0000 mg | ORAL_TABLET | Freq: Once | ORAL | Status: AC
  Administered 2024-07-14: 50 mg via ORAL
  Filled 2024-07-14: qty 1

## 2024-07-14 MED ORDER — ASPIRIN 81 MG PO TBEC
81.0000 mg | DELAYED_RELEASE_TABLET | Freq: Every day | ORAL | Status: DC
  Administered 2024-07-14 – 2024-07-16 (×3): 81 mg via ORAL
  Filled 2024-07-14 (×3): qty 1

## 2024-07-14 MED ORDER — ACETAMINOPHEN 325 MG PO TABS: 650.0000 mg | ORAL_TABLET | Freq: Four times a day (QID) | ORAL | Status: DC | PRN

## 2024-07-14 MED ORDER — MORPHINE SULFATE (PF) 2 MG/ML IV SOLN: 2.0000 mg | INTRAVENOUS | Status: DC | PRN

## 2024-07-14 MED ORDER — MEMANTINE HCL 10 MG PO TABS
5.0000 mg | ORAL_TABLET | Freq: Every day | ORAL | Status: DC
Start: 2024-07-14 — End: 2024-07-16
  Administered 2024-07-14 – 2024-07-16 (×3): 5 mg via ORAL
  Filled 2024-07-14 (×3): qty 1

## 2024-07-14 MED ORDER — INSULIN ASPART 100 UNIT/ML IJ SOLN: 0.0000 [IU] | Freq: Every day | INTRAMUSCULAR | Status: DC

## 2024-07-14 MED ORDER — METHOCARBAMOL 500 MG PO TABS: 500.0000 mg | ORAL_TABLET | Freq: Four times a day (QID) | ORAL | Status: DC | PRN

## 2024-07-14 MED ORDER — PANTOPRAZOLE SODIUM 40 MG PO TBEC
40.0000 mg | DELAYED_RELEASE_TABLET | Freq: Every day | ORAL | Status: DC
  Administered 2024-07-14 – 2024-07-16 (×3): 40 mg via ORAL
  Filled 2024-07-14 (×3): qty 1

## 2024-07-14 MED ORDER — METHOCARBAMOL 500 MG PO TABS
500.0000 mg | ORAL_TABLET | Freq: Once | ORAL | Status: AC
  Administered 2024-07-14: 500 mg via ORAL
  Filled 2024-07-14: qty 1

## 2024-07-14 MED ORDER — CEPHALEXIN 500 MG PO CAPS
500.0000 mg | ORAL_CAPSULE | Freq: Four times a day (QID) | ORAL | Status: DC
  Administered 2024-07-14 – 2024-07-16 (×8): 500 mg via ORAL
  Filled 2024-07-14 (×8): qty 1

## 2024-07-14 MED ORDER — CEPHALEXIN 500 MG PO CAPS
500.0000 mg | ORAL_CAPSULE | Freq: Once | ORAL | Status: AC
  Administered 2024-07-14: 500 mg via ORAL
  Filled 2024-07-14: qty 1

## 2024-07-14 MED ORDER — ONDANSETRON HCL 4 MG PO TABS: 4.0000 mg | ORAL_TABLET | Freq: Four times a day (QID) | ORAL | Status: DC | PRN

## 2024-07-14 MED ORDER — CEPHALEXIN 500 MG PO CAPS: 500.0000 mg | ORAL_CAPSULE | Freq: Three times a day (TID) | ORAL | 0 refills | Status: DC

## 2024-07-14 MED ORDER — ATORVASTATIN CALCIUM 10 MG PO TABS
10.0000 mg | ORAL_TABLET | Freq: Every day | ORAL | Status: DC
  Administered 2024-07-14 – 2024-07-16 (×3): 10 mg via ORAL
  Filled 2024-07-14 (×3): qty 1

## 2024-07-14 NOTE — ED Provider Notes (Addendum)
 [place] EMERGENCY DEPARTMENT AT [facility] Provider Note   CSN: [id number] Arrival date & time: 07/14/24  9042     Patient presents with: Abdominal Pain   Brittany Archer is a 83 y.o. female.   Pt with c/o mid to lower abdominal pain in past day. Dull, non radiating. Pt limited historian - level 5 caveat. No specific exacerbating or alleviating factors. No new back or flank pain. Hx compression fx to lumbar area, but denies any acute worsening of back pain, denies new or worsening radicular pain, no numbness/weakness. No fever or chills. Having normal bms. No abd distension or nv. No dysuria or gu c/o.  Family subsequently arrives, indicates had recent/prior back injury. Indicates now generally weak and not able to get up and ambulate due to pain.    The history is provided by the patient, medical records and the EMS personnel. The history is limited by the condition of the patient.  Abdominal Pain Associated symptoms: no chest pain, no chills, no diarrhea, no dysuria, no fever, no shortness of breath, no sore throat, no vaginal bleeding, no vaginal discharge and no vomiting        Prior to Admission medications   Medication Sig Start Date End Date Taking? Authorizing Provider  acetaminophen  (TYLENOL ) 500 MG tablet Take 2 tablets (1,000 mg total) by mouth every 8 (eight) hours. 05/29/24   Evonnie Lenis, MD  anastrozole  (ARIMIDEX ) 1 MG tablet Take 1 tablet (1 mg total) by mouth daily. 03/30/24   Rogers Hai, MD  aspirin  81 MG tablet Take 1 tablet (81 mg total) by mouth daily with breakfast. 03/14/22   Pearlean Manus, MD  atorvastatin  (LIPITOR) 10 MG tablet Take 10 mg by mouth daily. 08/10/23   [provider]  bortezomib  IV (VELCADE ) 3.5 MG injection 3.5 mg once a week. weekly    [provider]  cephALEXin  (KEFLEX ) 500 MG capsule Take 1 capsule (500 mg total) by mouth every 8 (eight) hours. X 3 days 05/30/24   Evonnie Lenis, MD  famotidine  (PEPCID ) 20 MG  tablet Take 20 mg by mouth at bedtime as needed. 03/16/24   [provider]  glucose blood (ACCU-CHEK AVIVA PLUS) test strip CHECK BLOOD SUGAR ONCE DAILY    [provider]  lenalidomide  (REVLIMID ) 10 MG capsule Take 1 capsule (10 mg total) by mouth daily. Take daily for 21 days, with 7 days off 06/22/24   Rogers Hai, MD  magnesium  oxide (MAG-OX) 400 (240 Mg) MG tablet TAKE ONE TABLET BY MOUTH THREE TIMES A DAY 05/01/24   Rogers Hai, MD  memantine (NAMENDA) 5 MG tablet TAKE ONE TABLET BY MOUTH ONCE DAILY Oral; Duration: 30 Days    [provider]  metFORMIN  (GLUCOPHAGE -XR) 500 MG 24 hr tablet SMARTSIG:1 Tablet(s) By Mouth Every Evening 03/16/24   [provider]  ondansetron  (ZOFRAN ) 4 MG tablet TAKE ONE TABLET BY MOUTH EVERY 6 HOURS AS NEEDED FOR NAUSEA AND VOMITING Oral; Duration: 8 Days    [provider]  oxyCODONE  (OXY IR/ROXICODONE ) 5 MG immediate release tablet Take 1 tablet (5 mg total) by mouth every 4 (four) hours as needed for moderate pain (pain score 4-6) or severe pain (pain score 7-10). 05/29/24   Tat, Lenis, MD  pantoprazole  (PROTONIX ) 40 MG tablet Take 1 tablet (40 mg total) by mouth daily. 05/30/24   Evonnie Lenis, MD  potassium chloride  (KLOR-CON ) 20 MEQ packet Take by mouth.    [provider]  potassium chloride  SA (KLOR-CON   M) 20 MEQ tablet Take 20 mEq by mouth 2 (two) times daily. 03/18/24   [provider]    Allergies: Motrin [ibuprofen] and Seasonal ic [cholestatin]    Review of Systems  Constitutional:  Negative for chills and fever.  HENT:  Negative for sore throat.   Respiratory:  Negative for shortness of breath.   Cardiovascular:  Negative for chest pain.  Gastrointestinal:  Positive for abdominal pain. Negative for diarrhea and vomiting.  Genitourinary:  Negative for dysuria, flank pain, vaginal bleeding and vaginal discharge.  Musculoskeletal:  Negative for neck pain.  Neurological:  Negative  for headaches.    Updated Vital Signs BP (!) 154/64   Pulse 68   Temp 98.5 F (36.9 C) (Oral)   Resp 19   Ht 1.626 m (5' 4)   Wt 70 kg   SpO2 98%   BMI 26.49 kg/m   Physical Exam Vitals and nursing note reviewed.  Constitutional:      Appearance: Normal appearance. She is well-developed.  HENT:     Head: Atraumatic.     Nose: Nose normal.     Mouth/Throat:     Mouth: Mucous membranes are moist.  Eyes:     General: No scleral icterus.    Conjunctiva/sclera: Conjunctivae normal.  Neck:     Trachea: No tracheal deviation.  Cardiovascular:     Rate and Rhythm: Normal rate and regular rhythm.     Pulses: Normal pulses.     Heart sounds: Normal heart sounds. No murmur heard.    No friction rub. No gallop.  Pulmonary:     Effort: Pulmonary effort is normal. No respiratory distress.     Breath sounds: Normal breath sounds.  Abdominal:     General: Bowel sounds are normal. There is no distension.     Palpations: Abdomen is soft. There is no mass.     Tenderness: There is abdominal tenderness. There is no guarding or rebound.     Comments: Mid abdominal tenderness.  Genitourinary:    Comments: No cva tenderness.  Musculoskeletal:        General: No swelling or tenderness.     Cervical back: Normal range of motion and neck supple. No rigidity. No muscular tenderness.  Skin:    General: Skin is warm and dry.     Findings: No rash.  Neurological:     Mental Status: She is alert.     Comments: Alert, speech normal. Motor/sens grossly intact bill lower extremities.   Psychiatric:        Mood and Affect: Mood normal.     (all labs ordered are listed, but only abnormal results are displayed) Results for orders placed or performed during the hospital encounter of 07/14/24  CBC   Collection Time: 07/14/24 10:56 AM  Result Value Ref Range   WBC 3.8 (L) 4.0 - 10.5 K/uL   RBC 3.08 (L) 3.87 - 5.11 MIL/uL   Hemoglobin 9.8 (L) 12.0 - 15.0 g/dL   HCT 69.0 (L) 63.9 - 53.9 %    MCV 100.3 (H) 80.0 - 100.0 fL   MCH 31.8 26.0 - 34.0 pg   MCHC 31.7 30.0 - 36.0 g/dL   RDW 82.4 (H) 88.4 - 84.4 %   Platelets 151 150 - 400 K/uL   nRBC 0.0 0.0 - 0.2 %  Comprehensive metabolic panel with GFR   Collection Time: 07/14/24 10:56 AM  Result Value Ref Range   Sodium 139 135 - 145 mmol/L   Potassium 3.7  3.5 - 5.1 mmol/L   Chloride 104 98 - 111 mmol/L   CO2 22 22 - 32 mmol/L   Glucose, Bld 100 (H) 70 - 99 mg/dL   BUN 13 8 - 23 mg/dL   Creatinine, Ser 9.03 0.44 - 1.00 mg/dL   Calcium  9.1 8.9 - 10.3 mg/dL   Total Protein 6.5 6.5 - 8.1 g/dL   Albumin 2.8 (L) 3.5 - 5.0 g/dL   AST 12 (L) 15 - 41 U/L   ALT 14 0 - 44 U/L   Alkaline Phosphatase 120 38 - 126 U/L   Total Bilirubin 1.0 0.0 - 1.2 mg/dL   GFR, Estimated 58 (L) >60 mL/min   Anion gap 13 5 - 15  Lipase, blood   Collection Time: 07/14/24 10:56 AM  Result Value Ref Range   Lipase 27 11 - 51 U/L  Urinalysis, Routine w reflex microscopic -Urine, Catheterized   Collection Time: 07/14/24 10:56 AM  Result Value Ref Range   Color, Urine YELLOW YELLOW   APPearance HAZY (A) CLEAR   Specific Gravity, Urine 1.010 1.005 - 1.030   pH 5.0 5.0 - 8.0   Glucose, UA NEGATIVE NEGATIVE mg/dL   Hgb urine dipstick MODERATE (A) NEGATIVE   Bilirubin Urine NEGATIVE NEGATIVE   Ketones, ur NEGATIVE NEGATIVE mg/dL   Protein, ur NEGATIVE NEGATIVE mg/dL   Nitrite NEGATIVE NEGATIVE   Leukocytes,Ua MODERATE (A) NEGATIVE   RBC / HPF 6-10 0 - 5 RBC/hpf   WBC, UA 11-20 0 - 5 WBC/hpf   Bacteria, UA RARE (A) NONE SEEN   Squamous Epithelial / HPF 0-5 0 - 5 /HPF   Mucus PRESENT    *Note: Due to a large number of results and/or encounters for the requested time period, some results have not been displayed. A complete set of results can be found in Results Review.   CT ABDOMEN PELVIS W CONTRAST Result Date: 07/14/2024 CLINICAL DATA:  Abdominal pain, discharge, UTI and back pain for 2 months, history of multiple myeloma * Tracking Code: BO *  EXAM: CT ABDOMEN AND PELVIS WITH CONTRAST TECHNIQUE: Multidetector CT imaging of the abdomen and pelvis was performed using the standard protocol following bolus administration of intravenous contrast. RADIATION DOSE REDUCTION: This exam was performed according to the departmental dose-optimization program which includes automated exposure control, adjustment of the mA and/or kV according to patient size and/or use of iterative reconstruction technique. CONTRAST:  OMNIPAQUE  IOHEXOL  300 MG/ML  SOLN COMPARISON:  05/27/2024 FINDINGS: Lower chest: No acute abnormality.  Coronary artery calcifications. Hepatobiliary: No solid liver abnormality is seen. No gallstones, gallbladder wall thickening, or biliary dilatation. Faintly calcified gallstones seen on prior examination are not clearly appreciated today. Pancreas: Unremarkable. No pancreatic ductal dilatation or surrounding inflammatory changes. Spleen: Normal in size without significant abnormality. Adrenals/Urinary Tract: Adrenal glands are unremarkable. Simple, benign left renal cortical cysts for which no further follow-up or characterization is required. Kidneys are otherwise normal, without renal calculi, solid lesion, or hydronephrosis. Bladder is unremarkable. Stomach/Bowel: Stomach is within normal limits. Appendix not clearly visualized. No evidence of bowel wall thickening, distention, or inflammatory changes. Descending and sigmoid diverticulosis. Vascular/Lymphatic: Aortic atherosclerosis. No enlarged abdominal or pelvic lymph nodes. Reproductive: Hysterectomy. Other: No abdominal wall hernia or abnormality. No ascites. Musculoskeletal: No acute or significant osseous findings. Unchanged superior and inferior endplate deformities of L2. New inferior endplate deformity of L3 and superior endplate deformity of L5. IMPRESSION: 1. No acute CT findings of the abdominal or pelvic organs to explain  abdominal pain, discharge, or hematuria. 2. Descending and  sigmoid diverticulosis without evidence of acute diverticulitis. 3. Unchanged superior and inferior endplate deformities of L2. New inferior endplate deformity of L3 and superior endplate deformity of L5. Correlate for acutely referable pain and point tenderness. Consider MRI to assess for fracture acuity and underlying lesion in this patient with history of multiple myeloma. 4. Coronary artery disease. Aortic Atherosclerosis (ICD10-I70.0). Electronically Signed   By: Marolyn JONETTA Jaksch M.D.   On: 07/14/2024 12:14     EKG: None  Radiology: CT ABDOMEN PELVIS W CONTRAST Result Date: 07/14/2024 CLINICAL DATA:  Abdominal pain, discharge, UTI and back pain for 2 months, history of multiple myeloma * Tracking Code: BO * EXAM: CT ABDOMEN AND PELVIS WITH CONTRAST TECHNIQUE: Multidetector CT imaging of the abdomen and pelvis was performed using the standard protocol following bolus administration of intravenous contrast. RADIATION DOSE REDUCTION: This exam was performed according to the departmental dose-optimization program which includes automated exposure control, adjustment of the mA and/or kV according to patient size and/or use of iterative reconstruction technique. CONTRAST:  OMNIPAQUE  IOHEXOL  300 MG/ML  SOLN COMPARISON:  05/27/2024 FINDINGS: Lower chest: No acute abnormality.  Coronary artery calcifications. Hepatobiliary: No solid liver abnormality is seen. No gallstones, gallbladder wall thickening, or biliary dilatation. Faintly calcified gallstones seen on prior examination are not clearly appreciated today. Pancreas: Unremarkable. No pancreatic ductal dilatation or surrounding inflammatory changes. Spleen: Normal in size without significant abnormality. Adrenals/Urinary Tract: Adrenal glands are unremarkable. Simple, benign left renal cortical cysts for which no further follow-up or characterization is required. Kidneys are otherwise normal, without renal calculi, solid lesion, or hydronephrosis.  Bladder is unremarkable. Stomach/Bowel: Stomach is within normal limits. Appendix not clearly visualized. No evidence of bowel wall thickening, distention, or inflammatory changes. Descending and sigmoid diverticulosis. Vascular/Lymphatic: Aortic atherosclerosis. No enlarged abdominal or pelvic lymph nodes. Reproductive: Hysterectomy. Other: No abdominal wall hernia or abnormality. No ascites. Musculoskeletal: No acute or significant osseous findings. Unchanged superior and inferior endplate deformities of L2. New inferior endplate deformity of L3 and superior endplate deformity of L5. IMPRESSION: 1. No acute CT findings of the abdominal or pelvic organs to explain abdominal pain, discharge, or hematuria. 2. Descending and sigmoid diverticulosis without evidence of acute diverticulitis. 3. Unchanged superior and inferior endplate deformities of L2. New inferior endplate deformity of L3 and superior endplate deformity of L5. Correlate for acutely referable pain and point tenderness. Consider MRI to assess for fracture acuity and underlying lesion in this patient with history of multiple myeloma. 4. Coronary artery disease. Aortic Atherosclerosis (ICD10-I70.0). Electronically Signed   By: Marolyn JONETTA Jaksch M.D.   On: 07/14/2024 12:14     Procedures   Medications Ordered in the ED  cephALEXin  (KEFLEX ) capsule 500 mg (has no administration in time range)  iohexol  (OMNIPAQUE ) 300 MG/ML solution 100 mL (100 mLs Intravenous Contrast Given 07/14/24 1142)                                    Medical Decision Making Problems Addressed: Abdominal pain, generalized: acute illness or injury with systemic symptoms that poses a threat to life or bodily functions Chronic anemia: chronic illness or injury Closed compression fracture of L3 vertebra, initial encounter [facility]): acute illness or injury Closed compression fracture of L5 vertebra, initial encounter [facility]): acute illness or injury Compression fracture of lumbar  vertebra, non-traumatic, sequela: chronic illness or injury History of multiple  myeloma: chronic illness or injury with exacerbation, progression, or side effects of treatment that poses a threat to life or bodily functions Suprapubic pain: acute illness or injury Urine white blood cells increased: acute illness or injury  Amount and/or Complexity of Data Reviewed Independent Historian: EMS    Details: hx External Data Reviewed: notes. Labs: ordered. Decision-making details documented in ED Course. Radiology: ordered and independent interpretation performed. Decision-making details documented in ED Course. Discussion of management or test interpretation with external provider(s): medicine  Risk OTC drugs. Prescription drug management. Decision regarding hospitalization.   Iv ns. Continuous pulse ox and cardiac monitoring. Labs ordered/sent. Imaging ordered.   Differential diagnosis includes diverticulitis, uti, acute abd process, etc. Dispo decision including potential need for admission considered - will get labs and imaging and reassess.   Reviewed nursing notes and prior charts for additional history. External reports reviewed. Additional history from: EMS.   Cardiac monitor: sinus rhythm, rate 68.  Labs reviewed/interpreted by me - wbc 4, hct 31, c/w prior. Chem unremarkable. Ua w possible uti. Given suprapubic area discomfort, ua, will tx. Keflex  po.   CT reviewed/interpreted by me - no acute abd  process. Lumbar compression fxs. L2 known, ?L3 and L5 new.   Ultram  po. Robaxin po.  Pt not able to ambulate, multiple prior compression fractures (already has brace), also possible uti on labs. Cx pending. Given inability to ambulate, new compression fx, uti - will consult hospitalists for admission. Discussed pt - will admit.           Final diagnoses:  None    ED Discharge Orders     None          Bernard Drivers, MD 07/14/24 [phone]

## 2024-07-14 NOTE — H&P (Addendum)
 History and Physical    Brittany Archer:[id number] DOB: [birth year] DOA: 07/14/2024  PCP: Katrinka Aquas, MD   Patient coming from: Home  Chief Complaint: Intractable low back pain with inability to ambulate  HPI: Brittany Archer is a 83 y.o. female with medical history significant for type 2 diabetes, CAD, bilateral breast cancer, multiple myeloma, and cognitive impairment, who was recently discharged on 6/2 to SNF after finding of a closed compression fracture to L2 lumbar vertebra and at that time was also noted to have a Klebsiella UTI.  She was discharged back to home after a 2-week stay.  She presented to the ED today with worsening low back pain and now inability to get up out of the bed due to the severity of her pain.  Sister at bedside states that this past Wednesday on 7/16, she was getting out of the car and she twisted and acutely injured her back.  Since then she has been more or less been resting in bed, but since this morning, could not get up out of bed due to the severity of her pain and had urinated on herself.  Her mentation is at baseline, per the sister at bedside.  She has not had any bowel movements in the last 1-2 days.  Patient denies any abdominal pain, nausea, vomiting, or change in bowel movements.  She denies any fevers or chills.   ED Course: Vital signs stable and patient afebrile.  WBC 3.8 and hemoglobin 9.8.  CT of the abdomen pelvis concerning for new endplate deformities at L3 and L5.  No other acute findings noted.  She was given some oral pain medications for relief of her pain and started on oral Keflex .  Family members were concerned about her intractable weakness and inability to ambulate.  Sister is at bedside and is the primary historian.  Review of Systems: As above in HPI.  Past Medical History:  Diagnosis Date   Breast cancer (HCC)    left breast/ 2008/ surg/ rad tx   Coronary artery disease    Diabetes mellitus     Past Surgical History:   Procedure Laterality Date   ABDOMINAL HYSTERECTOMY     BREAST BIOPSY Left 03/24/2024   US  LT BREAST BX W LOC DEV 1ST LESION IMG BX SPEC US  GUIDE 03/24/2024 [facility]   BREAST BIOPSY Left 03/24/2024   US  LT BREAST BX W LOC DEV EA ADD LESION IMG BX SPEC US  GUIDE 03/24/2024 [facility]   BREAST BIOPSY Right 03/24/2024   MM RT BREAST BX W LOC DEV EA AD LESION IMG BX SPEC STEREO GUIDE 03/24/2024 [facility]   BREAST BIOPSY Right 03/24/2024   MM RT BREAST BX W LOC DEV 1ST LESION IMAGE BX SPEC STEREO GUIDE 03/24/2024 [facility]   BREAST SURGERY     DEBRIDEMENT MANDIBLE N/A 02/22/2020   Procedure: INCISION AND DRAINAGE WITH DEBRIDEMENT MANDIBLE;  Surgeon: Joanette Soulier, DMD;  Location: [facility];  Service: Oral Surgery;  Laterality: N/A;   DEBRIDEMENT MANDIBLE Right 03/19/2021   Procedure: DEBRIDEMENT OF BONE RIGHT INTERIOR  MANDIBLE;  Surgeon: Joanette Soulier, DMD;  Location: [facility];  Service: Oral Surgery;  Laterality: Right;   EYE SURGERY  2021   cataract removals    TOOTH EXTRACTION N/A 02/22/2020   Procedure: DENTAL RESTORATION/EXTRACTIONS;  Surgeon: Joanette Soulier, DMD;  Location: [facility];  Service: Oral Surgery;  Laterality: N/A;  DENTAL KIT REQUESTED     reports that she has never smoked. She has  never used smokeless tobacco. She reports that she does not drink alcohol and does not use drugs.  Allergies  Allergen Reactions   Motrin [Ibuprofen] Rash   Seasonal Ic [Cholestatin] Other (See Comments)    Sneezing, watery eyes    Family History  Problem Relation Age of Onset   Obesity Sister    Breast cancer Sister        dx 7s   Breast cancer Sister    Kidney cancer Brother     Prior to Admission medications   Medication Sig Start Date End Date Taking? Authorizing Provider  cephALEXin  (KEFLEX ) 500 MG capsule Take 1 capsule (500 mg total) by mouth 3 (three) times daily. 07/14/24  Yes Bernard Drivers, MD  acetaminophen  (TYLENOL ) 500 MG tablet Take 2 tablets (1,000 mg  total) by mouth every 8 (eight) hours. 05/29/24   Evonnie Lenis, MD  anastrozole  (ARIMIDEX ) 1 MG tablet Take 1 tablet (1 mg total) by mouth daily. 03/30/24   Rogers Hai, MD  aspirin  81 MG tablet Take 1 tablet (81 mg total) by mouth daily with breakfast. 03/14/22   Emokpae, Courage, MD  atorvastatin  (LIPITOR) 10 MG tablet Take 10 mg by mouth daily. 08/10/23   [provider]  bortezomib  IV (VELCADE ) 3.5 MG injection 3.5 mg once a week. weekly    [provider]  famotidine  (PEPCID ) 20 MG tablet Take 20 mg by mouth at bedtime as needed. 03/16/24   [provider]  glucose blood (ACCU-CHEK AVIVA PLUS) test strip CHECK BLOOD SUGAR ONCE DAILY    [provider]  lenalidomide  (REVLIMID ) 10 MG capsule Take 1 capsule (10 mg total) by mouth daily. Take daily for 21 days, with 7 days off 06/22/24   Rogers Hai, MD  magnesium  oxide (MAG-OX) 400 (240 Mg) MG tablet TAKE ONE TABLET BY MOUTH THREE TIMES A DAY 05/01/24   Rogers Hai, MD  memantine (NAMENDA) 5 MG tablet TAKE ONE TABLET BY MOUTH ONCE DAILY Oral; Duration: 30 Days    [provider]  metFORMIN  (GLUCOPHAGE -XR) 500 MG 24 hr tablet SMARTSIG:1 Tablet(s) By Mouth Every Evening 03/16/24   [provider]  ondansetron  (ZOFRAN ) 4 MG tablet TAKE ONE TABLET BY MOUTH EVERY 6 HOURS AS NEEDED FOR NAUSEA AND VOMITING Oral; Duration: 8 Days    [provider]  oxyCODONE  (OXY IR/ROXICODONE ) 5 MG immediate release tablet Take 1 tablet (5 mg total) by mouth every 4 (four) hours as needed for moderate pain (pain score 4-6) or severe pain (pain score 7-10). 05/29/24   Tat, Lenis, MD  pantoprazole  (PROTONIX ) 40 MG tablet Take 1 tablet (40 mg total) by mouth daily. 05/30/24   Evonnie Lenis, MD  potassium chloride  (KLOR-CON ) 20 MEQ packet Take by mouth.    [provider]  potassium chloride  SA (KLOR-CON  M) 20 MEQ tablet Take 20 mEq by mouth 2 (two) times daily. 03/18/24   [provider]     Physical Exam: Vitals:   07/14/24 1005 07/14/24 1006 07/14/24 1007 07/14/24 1008  BP: (!) 154/64     Pulse:    68  Resp:    19  Temp:   98.5 F (36.9 C)   TempSrc:   Oral   SpO2:    98%  Weight:  70 kg    Height:  5' 4 (1.626 m)      Constitutional: NAD, calm, comfortable Vitals:   07/14/24 1005 07/14/24 1006 07/14/24 1007 07/14/24 1008  BP: (!) 154/64     Pulse:  68  Resp:    19  Temp:   98.5 F (36.9 C)   TempSrc:   Oral   SpO2:    98%  Weight:  70 kg    Height:  5' 4 (1.626 m)     Eyes: lids and conjunctivae normal Neck: normal, supple Respiratory: clear to auscultation bilaterally. Normal respiratory effort. No accessory muscle use.  Cardiovascular: Regular rate and rhythm, no murmurs. Abdomen: no tenderness, no distention. Bowel sounds positive.  Musculoskeletal:  No edema. Skin: no rashes, lesions, ulcers.  Psychiatric: Flat affect  Labs on Admission: I have personally reviewed following labs and imaging studies  CBC: Recent Labs  Lab 07/14/24 1056  WBC 3.8*  HGB 9.8*  HCT 30.9*  MCV 100.3*  PLT 151   Basic Metabolic Panel: Recent Labs  Lab 07/14/24 1056  NA 139  K 3.7  CL 104  CO2 22  GLUCOSE 100*  BUN 13  CREATININE 0.96  CALCIUM  9.1   GFR: Estimated Creatinine Clearance: 41.9 mL/min (by C-G formula based on SCr of 0.96 mg/dL). Liver Function Tests: Recent Labs  Lab 07/14/24 1056  AST 12*  ALT 14  ALKPHOS 120  BILITOT 1.0  PROT 6.5  ALBUMIN 2.8*   Recent Labs  Lab 07/14/24 1056  LIPASE 27   No results for input(s): AMMONIA in the last 168 hours. Coagulation Profile: No results for input(s): INR, PROTIME in the last 168 hours. Cardiac Enzymes: No results for input(s): CKTOTAL, CKMB, CKMBINDEX, TROPONINI in the last 168 hours. BNP (last 3 results) No results for input(s): PROBNP in the last 8760 hours. HbA1C: No results for input(s): HGBA1C in the last 72 hours. CBG: No results for input(s):  GLUCAP in the last 168 hours. Lipid Profile: No results for input(s): CHOL, HDL, LDLCALC, TRIG, CHOLHDL, LDLDIRECT in the last 72 hours. Thyroid Function Tests: No results for input(s): TSH, T4TOTAL, FREET4, T3FREE, THYROIDAB in the last 72 hours. Anemia Panel: No results for input(s): VITAMINB12, FOLATE, FERRITIN, TIBC, IRON, RETICCTPCT in the last 72 hours. Urine analysis:    Component Value Date/Time   COLORURINE YELLOW 07/14/2024 1056   APPEARANCEUR HAZY (A) 07/14/2024 1056   LABSPEC 1.010 07/14/2024 1056   PHURINE 5.0 07/14/2024 1056   GLUCOSEU NEGATIVE 07/14/2024 1056   HGBUR MODERATE (A) 07/14/2024 1056   BILIRUBINUR NEGATIVE 07/14/2024 1056   KETONESUR NEGATIVE 07/14/2024 1056   PROTEINUR NEGATIVE 07/14/2024 1056   UROBILINOGEN 0.2 09/11/2011 0005   NITRITE NEGATIVE 07/14/2024 1056   LEUKOCYTESUR MODERATE (A) 07/14/2024 1056    Radiological Exams on Admission: CT ABDOMEN PELVIS W CONTRAST Result Date: 07/14/2024 CLINICAL DATA:  Abdominal pain, discharge, UTI and back pain for 2 months, history of multiple myeloma * Tracking Code: BO * EXAM: CT ABDOMEN AND PELVIS WITH CONTRAST TECHNIQUE: Multidetector CT imaging of the abdomen and pelvis was performed using the standard protocol following bolus administration of intravenous contrast. RADIATION DOSE REDUCTION: This exam was performed according to the departmental dose-optimization program which includes automated exposure control, adjustment of the mA and/or kV according to patient size and/or use of iterative reconstruction technique. CONTRAST:  OMNIPAQUE  IOHEXOL  300 MG/ML  SOLN COMPARISON:  05/27/2024 FINDINGS: Lower chest: No acute abnormality.  Coronary artery calcifications. Hepatobiliary: No solid liver abnormality is seen. No gallstones, gallbladder wall thickening, or biliary dilatation. Faintly calcified gallstones seen on prior examination are not clearly appreciated today. Pancreas:  Unremarkable. No pancreatic ductal dilatation or surrounding inflammatory changes. Spleen: Normal in size without significant abnormality. Adrenals/Urinary  Tract: Adrenal glands are unremarkable. Simple, benign left renal cortical cysts for which no further follow-up or characterization is required. Kidneys are otherwise normal, without renal calculi, solid lesion, or hydronephrosis. Bladder is unremarkable. Stomach/Bowel: Stomach is within normal limits. Appendix not clearly visualized. No evidence of bowel wall thickening, distention, or inflammatory changes. Descending and sigmoid diverticulosis. Vascular/Lymphatic: Aortic atherosclerosis. No enlarged abdominal or pelvic lymph nodes. Reproductive: Hysterectomy. Other: No abdominal wall hernia or abnormality. No ascites. Musculoskeletal: No acute or significant osseous findings. Unchanged superior and inferior endplate deformities of L2. New inferior endplate deformity of L3 and superior endplate deformity of L5. IMPRESSION: 1. No acute CT findings of the abdominal or pelvic organs to explain abdominal pain, discharge, or hematuria. 2. Descending and sigmoid diverticulosis without evidence of acute diverticulitis. 3. Unchanged superior and inferior endplate deformities of L2. New inferior endplate deformity of L3 and superior endplate deformity of L5. Correlate for acutely referable pain and point tenderness. Consider MRI to assess for fracture acuity and underlying lesion in this patient with history of multiple myeloma. 4. Coronary artery disease. Aortic Atherosclerosis (ICD10-I70.0). Electronically Signed   By: Marolyn JONETTA Jaksch M.D.   On: 07/14/2024 12:14    Assessment/Plan Principal Problem:   Closed compression fracture of L2 lumbar vertebra, initial encounter [facility]) Active Problems:   Type 2 diabetes mellitus (HCC)   CKD (chronic kidney disease), stage III B   UTI (urinary tract infection)   Dementia without behavioral disturbance (HCC)   Coronary  artery disease   Osteonecrosis (HCC)   Leukopenia   Bilateral breast cancer (HCC)   GERD (gastroesophageal reflux disease)    Intractable low back pain with prior L2 compression fracture and possible new compression fractures to lumbar vertebra with worsening debility/deconditioning -Noted to have history of osteoporosis and 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 - Pain management - TLSO brace - MRI to lumbar spine as recommended on CT imaging for further evaluation - Fall precautions - PT/OT evaluation with likely need for repeat SNF placement  Possible UTI - Follow-up urine cultures - Recent discharge on 6/2 with Klebsiella UTI that was sensitive to Keflex , continue this for now for total 3-day course and follow ID and sensitivities  CAD - No chest pain currently - Continue aspirin  and statin  Type 2 diabetes - Currently without hyperglycemia - Hold home metformin  - Carb modified diet and SSI  Cognitive impairment - Continue memantine - Monitor for behavioral disturbances  Multiple myeloma - Follows with Dr. Rogers  Bilateral breast cancer-stage II - Follows with Dr. Rogers, definitive breast cancer management withheld for now given worsening myeloma - Continue anastrozole   Chronic macrocytic anemia - Felt to be due to myelosuppression - Hemoglobin at baseline around 9-10  GERD - PPI  DVT prophylaxis: Lovenox  Code Status: Full Family Communication: Patient lives with sister who is present at bedside. Disposition Plan: Admit for UTI treatment and PT/OT evaluation Consults called: None Admission status: Observation, MedSurg  Severity of Illness: The appropriate patient status for this patient is OBSERVATION. Observation status is judged to be reasonable and necessary in order to provide the required intensity of service to ensure the patient's safety. The patient's presenting symptoms, physical exam findings, and initial radiographic and  laboratory data in the context of their medical condition is felt to place them at decreased risk for further clinical deterioration. Furthermore, it is anticipated that the patient will be medically stable for discharge from the hospital within 2 midnights of admission.  Melany Wiesman D Maree DO [facility]  If 7PM-7AM, please contact night-coverage [email or web address]  07/14/2024, 3:51 PM

## 2024-07-14 NOTE — ED Notes (Signed)
 Milky discharge and odor noted during in and out.

## 2024-07-14 NOTE — Discharge Instructions (Signed)
 It was our pleasure to provide your ER care today - we hope that you feel better.  Drink plenty of fluids/stay well hydrated. Your urine tests show a possible urine infection - take keflex  (antibiotic) as prescribed. Take your pain medication as need.   Your ct scan shows no acute intra-abdominal process - note is made of your lumbar compression fractures.   Follow up closely with primary care doctor in the coming week.  Return to ER if worse, new symptoms, fevers, new/severe pain, numbness/weakness, or other concern.

## 2024-07-14 NOTE — ED Triage Notes (Addendum)
 Pt bib [facility] for back pain x2 days. Pt is in back brace from a previous injury x1-27months ago. Also c/o of UTI.

## 2024-07-15 DIAGNOSIS — Z79899 Other long term (current) drug therapy: Secondary | ICD-10-CM | POA: Diagnosis not present

## 2024-07-15 DIAGNOSIS — M879 Osteonecrosis, unspecified: Secondary | ICD-10-CM | POA: Diagnosis not present

## 2024-07-15 DIAGNOSIS — Z7984 Long term (current) use of oral hypoglycemic drugs: Secondary | ICD-10-CM | POA: Diagnosis not present

## 2024-07-15 DIAGNOSIS — S32029A Unspecified fracture of second lumbar vertebra, initial encounter for closed fracture: Secondary | ICD-10-CM | POA: Diagnosis not present

## 2024-07-15 DIAGNOSIS — X501XXA Overexertion from prolonged static or awkward postures, initial encounter: Secondary | ICD-10-CM | POA: Diagnosis not present

## 2024-07-15 DIAGNOSIS — F0390 Unspecified dementia without behavioral disturbance: Secondary | ICD-10-CM | POA: Diagnosis not present

## 2024-07-15 DIAGNOSIS — R4189 Other symptoms and signs involving cognitive functions and awareness: Secondary | ICD-10-CM | POA: Diagnosis not present

## 2024-07-15 DIAGNOSIS — D539 Nutritional anemia, unspecified: Secondary | ICD-10-CM | POA: Diagnosis not present

## 2024-07-15 DIAGNOSIS — Y9389 Activity, other specified: Secondary | ICD-10-CM | POA: Diagnosis not present

## 2024-07-15 DIAGNOSIS — Z7982 Long term (current) use of aspirin: Secondary | ICD-10-CM | POA: Diagnosis not present

## 2024-07-15 DIAGNOSIS — R1084 Generalized abdominal pain: Secondary | ICD-10-CM | POA: Diagnosis present

## 2024-07-15 DIAGNOSIS — Z803 Family history of malignant neoplasm of breast: Secondary | ICD-10-CM | POA: Diagnosis not present

## 2024-07-15 DIAGNOSIS — E876 Hypokalemia: Secondary | ICD-10-CM | POA: Diagnosis not present

## 2024-07-15 DIAGNOSIS — S32020A Wedge compression fracture of second lumbar vertebra, initial encounter for closed fracture: Secondary | ICD-10-CM

## 2024-07-15 DIAGNOSIS — N1832 Chronic kidney disease, stage 3b: Secondary | ICD-10-CM | POA: Diagnosis not present

## 2024-07-15 DIAGNOSIS — M810 Age-related osteoporosis without current pathological fracture: Secondary | ICD-10-CM | POA: Diagnosis not present

## 2024-07-15 DIAGNOSIS — K219 Gastro-esophageal reflux disease without esophagitis: Secondary | ICD-10-CM | POA: Diagnosis not present

## 2024-07-15 DIAGNOSIS — C50912 Malignant neoplasm of unspecified site of left female breast: Secondary | ICD-10-CM | POA: Diagnosis not present

## 2024-07-15 DIAGNOSIS — C9000 Multiple myeloma not having achieved remission: Secondary | ICD-10-CM | POA: Diagnosis not present

## 2024-07-15 DIAGNOSIS — I2510 Atherosclerotic heart disease of native coronary artery without angina pectoris: Secondary | ICD-10-CM | POA: Diagnosis not present

## 2024-07-15 DIAGNOSIS — Z79811 Long term (current) use of aromatase inhibitors: Secondary | ICD-10-CM | POA: Diagnosis not present

## 2024-07-15 DIAGNOSIS — Z8051 Family history of malignant neoplasm of kidney: Secondary | ICD-10-CM | POA: Diagnosis not present

## 2024-07-15 DIAGNOSIS — C50911 Malignant neoplasm of unspecified site of right female breast: Secondary | ICD-10-CM | POA: Diagnosis not present

## 2024-07-15 DIAGNOSIS — E1122 Type 2 diabetes mellitus with diabetic chronic kidney disease: Secondary | ICD-10-CM | POA: Diagnosis not present

## 2024-07-15 LAB — BASIC METABOLIC PANEL WITH GFR
Anion gap: 9 (ref 5–15)
BUN: 10 mg/dL (ref 8–23)
CO2: 24 mmol/L (ref 22–32)
Calcium: 8.7 mg/dL — ABNORMAL LOW (ref 8.9–10.3)
Chloride: 104 mmol/L (ref 98–111)
Creatinine, Ser: 0.87 mg/dL (ref 0.44–1.00)
GFR, Estimated: >60 mL/min (ref >60)
Glucose, Bld: 82 mg/dL (ref 70–99)
Potassium: 3 mmol/L — ABNORMAL LOW (ref 3.5–5.1)
Sodium: 137 mmol/L (ref 135–145)

## 2024-07-15 LAB — CBC
HCT: 29.3 % — ABNORMAL LOW (ref 36.0–46.0)
Hemoglobin: 9.6 g/dL — ABNORMAL LOW (ref 12.0–15.0)
MCH: 32.5 pg (ref 26.0–34.0)
MCHC: 32.8 g/dL (ref 30.0–36.0)
MCV: 99.3 fL (ref 80.0–100.0)
Platelets: 149 K/uL — ABNORMAL LOW (ref 150–400)
RBC: 2.95 MIL/uL — ABNORMAL LOW (ref 3.87–5.11)
RDW: 17.4 % — ABNORMAL HIGH (ref 11.5–15.5)
WBC: 3.1 K/uL — ABNORMAL LOW (ref 4.0–10.5)
nRBC: 0 % (ref 0.0–0.2)

## 2024-07-15 LAB — GLUCOSE, CAPILLARY
Glucose-Capillary: 88 mg/dL (ref 70–99)
Glucose-Capillary: 180 mg/dL — ABNORMAL HIGH (ref 70–99)
Glucose-Capillary: 127 mg/dL — ABNORMAL HIGH (ref 70–99)
Glucose-Capillary: 114 mg/dL — ABNORMAL HIGH (ref 70–99)

## 2024-07-15 LAB — MAGNESIUM: Magnesium: 1.7 mg/dL (ref 1.7–2.4)

## 2024-07-15 MED ORDER — POTASSIUM CHLORIDE CRYS ER 20 MEQ PO TBCR
40.0000 meq | EXTENDED_RELEASE_TABLET | Freq: Once | ORAL | Status: AC
  Administered 2024-07-15: 40 meq via ORAL
  Filled 2024-07-15: qty 2

## 2024-07-15 MED ORDER — MAGNESIUM SULFATE IN D5W 1-5 GM/100ML-% IV SOLN
1.0000 g | Freq: Once | INTRAVENOUS | Status: AC
  Administered 2024-07-15: 1 g via INTRAVENOUS
  Filled 2024-07-15: qty 100

## 2024-07-15 NOTE — Plan of Care (Signed)
  Problem: Coping: Goal: Level of anxiety will decrease Outcome: Progressing   Problem: Elimination: Goal: Will not experience complications related to urinary retention Outcome: Progressing   Problem: Pain Managment: Goal: General experience of comfort will improve and/or be controlled Outcome: Progressing   Problem: Safety: Goal: Ability to remain free from injury will improve Outcome: Progressing   Problem: Skin Integrity: Goal: Risk for impaired skin integrity will decrease Outcome: Progressing   Problem: Skin Integrity: Goal: Risk for impaired skin integrity will decrease Outcome: Progressing   Problem: Tissue Perfusion: Goal: Adequacy of tissue perfusion will improve Outcome: Progressing

## 2024-07-15 NOTE — Progress Notes (Signed)
 Patient able to transfer from from chair to [facility] with w/c, minimal assist. Patient then transferred back to bed upon request.

## 2024-07-15 NOTE — NC FL2 (Signed)
 [place]  MEDICAID FL2 LEVEL OF CARE FORM     IDENTIFICATION  Patient Name: Brittany Archer Birthdate: Jun 03, 1941 Sex: female Admission Date (Current Location): 07/14/2024  [facility] and [place] Number:  [employer] and Address:  [facility],  [address], [place] [address]      Provider Number: [id number]  Attending Physician Name and Address:  Bryn Bernardino NOVAK, MD  Relative Name and Phone Number:       Current Level of Care: Hospital Recommended Level of Care: Skilled Nursing Facility Prior Approval Number:    Date Approved/Denied:   PASRR Number: [id number]  Discharge Plan: SNF    Current Diagnoses: Patient Active Problem List   Diagnosis Date Noted   Cystitis 05/29/2024   Closed compression fracture of L2 lumbar vertebra, initial encounter (HCC) 05/27/2024   Type 2 diabetes mellitus with hyperglycemia (HCC) 05/27/2024   GERD (gastroesophageal reflux disease) 05/27/2024   Genetic testing 05/04/2024   Breast cancer of upper-inner quadrant of left female breast (HCC) 03/30/2024   Bilateral breast cancer (HCC) 03/30/2024   Macrocytic anemia 03/14/2022   Hypoglycemia 03/14/2022   CKD (chronic kidney disease), stage III B 03/14/2022   UTI (urinary tract infection) 03/14/2022   Dementia without behavioral disturbance (HCC) 03/14/2022   Open mouth wound 11/15/2020   Encounter for peripheral line placement 03/25/2020   PICC (peripherally inserted central catheter) in place 03/18/2020   Leukopenia 03/18/2020   Osteomyelitis, jaw acute    Osteonecrosis (HCC)    Confusion 02/20/2020   Facial cellulitis 02/19/2020   Type 2 diabetes mellitus (HCC)    Coronary artery disease    Hypertension    Stage 3b chronic kidney disease (HCC)    Hypomagnesemia    Hypophosphatemia    Hypokalemia 01/03/2020   CKD (chronic kidney disease) 05/01/2019   B12 deficiency 02/09/2018   Multiple myeloma not having achieved remission (HCC) 01/20/2018   Iron  deficiency anemia 11/25/2017    Orientation RESPIRATION BLADDER Height & Weight     Self, Situation, Place  Normal Continent Weight: 63.8 kg Height:  5' 4 (162.6 cm)  BEHAVIORAL SYMPTOMS/MOOD NEUROLOGICAL BOWEL NUTRITION STATUS      Continent Diet (see dc summary)  AMBULATORY STATUS COMMUNICATION OF NEEDS Skin   Extensive Assist Verbally Normal                       Personal Care Assistance Level of Assistance  Bathing, Feeding, Dressing Bathing Assistance: Maximum assistance Feeding assistance: Independent Dressing Assistance: Maximum assistance     Functional Limitations Info  Sight, Hearing, Speech Sight Info: Adequate Hearing Info: Adequate Speech Info: Adequate    SPECIAL CARE FACTORS FREQUENCY  PT (By licensed PT), OT (By licensed OT)     PT Frequency: 5x weekly OT Frequency: 5x weekly            Contractures Contractures Info: Not present    Additional Factors Info  Code Status, Allergies Code Status Info: Full Allergies Info: Motrin (Ibuprofen), Seasonal Ic (Cholestatin)           Current Medications (07/15/2024):  This is the current hospital active medication list Current Facility-Administered Medications  Medication Dose Route Frequency Provider Last Rate Last Admin   acetaminophen  (TYLENOL ) tablet 650 mg  650 mg Oral Q6H PRN Maree, Pratik D, DO       Or   acetaminophen  (TYLENOL ) suppository 650 mg  650 mg Rectal Q6H PRN Maree, Pratik D, DO  anastrozole  (ARIMIDEX ) tablet 1 mg  1 mg Oral Daily Maree, Pratik D, DO   1 mg at 07/15/24 1230   aspirin  EC tablet 81 mg  81 mg Oral Daily Shah, Pratik D, DO   81 mg at 07/15/24 9197   atorvastatin  (LIPITOR) tablet 10 mg  10 mg Oral Daily Maree, Pratik D, DO   10 mg at 07/15/24 9197   cephALEXin  (KEFLEX ) capsule 500 mg  500 mg Oral Q6H Shah, Pratik D, DO   500 mg at 07/15/24 1230   enoxaparin  (LOVENOX ) injection 40 mg  40 mg Subcutaneous Q24H Maree, Pratik D, DO   40 mg at 07/14/24 2147   insulin  aspart  (novoLOG ) injection 0-5 Units  0-5 Units Subcutaneous QHS Maree, Pratik D, DO       insulin  aspart (novoLOG ) injection 0-9 Units  0-9 Units Subcutaneous TID WC Maree, Pratik D, DO   2 Units at 07/15/24 1233   memantine  (NAMENDA ) tablet 5 mg  5 mg Oral Daily Maree, Pratik D, DO   5 mg at 07/15/24 9197   methocarbamol  (ROBAXIN ) tablet 500 mg  500 mg Oral Q6H PRN Maree, Pratik D, DO       morphine  (PF) 2 MG/ML injection 2 mg  2 mg Intravenous Q2H PRN Maree, Pratik D, DO       ondansetron  (ZOFRAN ) tablet 4 mg  4 mg Oral Q6H PRN Maree, Pratik D, DO       pantoprazole  (PROTONIX ) EC tablet 40 mg  40 mg Oral Daily Maree, Pratik D, DO   40 mg at 07/15/24 9197   traMADol  (ULTRAM ) tablet 50 mg  50 mg Oral Q6H PRN Shah, Pratik D, DO   50 mg at 07/15/24 1551     Discharge Medications: Please see discharge summary for a list of discharge medications.  Relevant Imaging Results:  Relevant Lab Results:   Additional Information [id number]  Nena LITTIE Coffee, RN

## 2024-07-15 NOTE — Plan of Care (Signed)
  Problem: Acute Rehab PT Goals(only PT should resolve) Goal: Pt Will Go Supine/Side To Sit Outcome: Progressing Flowsheets (Taken 07/15/2024 1005) Pt will go Supine/Side to Sit: with minimal assist Goal: Patient Will Transfer Sit To/From Stand Outcome: Progressing Flowsheets (Taken 07/15/2024 1005) Patient will transfer sit to/from stand: with minimal assist Goal: Pt Will Transfer Bed To Chair/Chair To Bed Outcome: Progressing Flowsheets (Taken 07/15/2024 1005) Pt will Transfer Bed to Chair/Chair to Bed: with contact guard assist Goal: Pt Will Ambulate Outcome: Progressing Flowsheets (Taken 07/15/2024 1005) Pt will Ambulate:  15 feet  with contact guard assist  with least restrictive assistive device    10:06 AM, 07/15/24 Rosaria Settler, PT, DPT [place] with [facility]

## 2024-07-15 NOTE — Progress Notes (Signed)
 [facility] PROGRESS NOTE  Brittany Archer (DOB: Jan 19, 1941) FMW:[id number] PCP: Brittany Aquas, MD  Brief Narrative: HPI: Brittany Archer is a 83 y.o. female with medical history significant for type 2 diabetes, CAD, bilateral breast cancer, multiple myeloma, and cognitive impairment, who was recently discharged on 6/2 to SNF after finding of a closed compression fracture to L2 lumbar vertebra and at that time was also noted to have a Klebsiella UTI.  She was discharged back to home after a 2-week stay.  She presented to the ED today with worsening low back pain and now inability to get up out of the bed due to the severity of her pain.  Sister at bedside states that this past Wednesday on 7/16, she was getting out of the car and she twisted and acutely injured her back.  Since then she has been more or less been resting in bed, but since this morning, could not get up out of bed due to the severity of her pain and had urinated on herself.  Her mentation is at baseline, per the sister at bedside.  She has not had any bowel movements in the last 1-2 days.  Patient denies any abdominal pain, nausea, vomiting, or change in bowel movements.  She denies any fevers or chills.    ED Course: Vital signs stable and patient afebrile.  WBC 3.8 and hemoglobin 9.8.  CT of the abdomen pelvis concerning for new endplate deformities at L3 and L5.  No other acute findings noted.  She was given some oral pain medications for relief of her pain and started on oral Keflex .  Family members were concerned about her intractable weakness and inability to ambulate.  Sister is at bedside and is the primary historian.  Hospital course: TLSO brace applied and patient has had PT evaluation, recommending SNF rehabilitation which will be pursued.   Subjective: No complaints, her back pain across the lower back is stable but better controlled. Got up to bathroom with PT, sister at bedside is her only caretaker at home and  unable to assist. Patient did require significant help, therefore rehab is warranted. No numbness or weakness in the legs, no new urinary or fecal incontinence.   Objective: BP (!) 140/57 (BP Location: Left Arm)   Pulse 61   Temp 98.9 F (37.2 C) (Oral)   Resp 20   Ht 5' 4 (1.626 m)   Wt 63.8 kg   SpO2 98%   BMI 24.14 kg/m   Gen: No distress, sitting in chair Pulm: Clear, nonlabored  CV: RRR, no MRG GI: Soft, NT, ND, +BS Neuro: Alert and interactive, cooperative with exam. Significant pain when leaning forward in chair. Sensation is normal throughout legs and strength is preserved bilaterally. patellar reflexes grossly symmetrical.  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  - MRI to lumbar spine confirmed new wedge compression fractures at L3 and L5 with less than 25% height loss and unchanged mild L2 compression deformity. There are other areas of stenosis without neurological compromise on exam.  - Fall precautions   Possible UTI - Follow-up urine cultures - Recent discharge on 6/2  with Klebsiella UTI that was sensitive to Keflex , continue this for now for total 3-day course and follow ID and sensitivities   Hypokalemia:  - Supplement today. With marginally low magnesium  as well, will give 1g of Mg as well.   CAD - No chest pain currently - Continue aspirin  and statin   Type 2 diabetes - Currently without hyperglycemia - Hold home metformin  - Carb modified diet and SSI   Cognitive impairment - Continue memantine  - Monitor for behavioral disturbances    Multiple myeloma - Follows with Dr. Rogers   Bilateral breast cancer-stage II - Follows with Dr. Katragadda, definitive breast cancer management withheld for now given worsening myeloma - Continue anastrozole    Chronic macrocytic anemia - Felt to be due to myelosuppression - Hemoglobin at baseline around 9-10, stable.    GERD - PPI  Bernardino [name], MD [facility] [email or web address] 07/15/2024, 11:29 AM

## 2024-07-15 NOTE — TOC Initial Note (Signed)
 Transition of Care [facility]) - Initial/Assessment Note    Patient Details  Name: Brittany Archer MRN: [id number] Date of Birth: [birth year]  [facility] CM/SW Contact:    Nena LITTIE Coffee, RN Phone Number: 07/15/2024, 4:33 PM  Clinical Narrative:                 Pt admitted c/closed compression fracture of L2 lumbar vertebra. Assessed for SNF placement.  Expected Discharge Plan: Skilled Nursing Facility Barriers to Discharge: Continued Medical Work up   Patient Goals and CMS Choice     Choice offered to / list presented to : Patient, Sibling [place] ownership interest in [facility].provided to:: Sibling    Expected Discharge Plan and Services In-house Referral: Clinical Social Work Discharge Planning Services: CM Consult Post Acute Care Choice: Skilled Nursing Facility Living arrangements for the past 2 months: Skilled Nursing Facility, Single Family Home                                      Prior Living Arrangements/Services Living arrangements for the past 2 months: Skilled Nursing Facility, Single Family Home Lives with:: Siblings Patient language and need for interpreter reviewed:: Yes        Need for Family Participation in Patient Care: Yes (Comment) Care giver support system in place?: Yes (comment)   Criminal Activity/Legal Involvement Pertinent to Current Situation/Hospitalization: No - Comment as needed  Activities of Daily Living   ADL Screening (condition at time of admission) Independently performs ADLs?: No Does the patient have a NEW difficulty with bathing/dressing/toileting/self-feeding that is expected to last >3 days?: Yes (Initiates electronic notice to provider for possible OT consult) Does the patient have a NEW difficulty with getting in/out of bed, walking, or climbing stairs that is expected to last >3 days?: Yes (Initiates electronic notice to provider for possible PT consult) Does the patient have a NEW  difficulty with communication that is expected to last >3 days?: No Is the patient deaf or have difficulty hearing?: No Does the patient have difficulty seeing, even when wearing glasses/contacts?: No Does the patient have difficulty concentrating, remembering, or making decisions?: Yes  Permission Sought/Granted Permission sought to share information with : Case Manager, Family Supports, Magazine features editor Permission granted to share information with : Yes, Verbal Permission Granted              Emotional Assessment Appearance:: Appears stated age Attitude/Demeanor/Rapport: Engaged Affect (typically observed): Appropriate Orientation: : Oriented to Self, Oriented to Place, Oriented to Situation Alcohol / Substance Use: Not Applicable Psych Involvement: No (comment)  Admission diagnosis:  Abdominal pain, generalized [R10.84] UTI (urinary tract infection) [N39.0] Suprapubic pain [R10.2] Urine white blood cells increased [R82.998] History of multiple myeloma [Z85.79] Chronic anemia [D64.9] Compression fracture of lumbar vertebra, non-traumatic, sequela [M48.56XS] Closed compression fracture of L5 vertebra, initial encounter (HCC) [S32.050A] Closed compression fracture of L3 vertebra, initial encounter (HCC) [S32.030A] Patient Active Problem List   Diagnosis Date Noted   Cystitis 05/29/2024   Closed compression fracture of L2 lumbar vertebra, initial encounter (HCC) 05/27/2024   Type 2 diabetes mellitus with hyperglycemia (HCC) 05/27/2024   GERD (gastroesophageal reflux disease) 05/27/2024   Genetic testing 05/04/2024   Breast cancer of upper-inner quadrant of left female breast (HCC) 03/30/2024   Bilateral breast cancer (HCC) 03/30/2024   Macrocytic anemia 03/14/2022   Hypoglycemia 03/14/2022   CKD (chronic kidney disease),  stage III B 03/14/2022   UTI (urinary tract infection) 03/14/2022   Dementia without behavioral disturbance (HCC) 03/14/2022   Open mouth  wound 11/15/2020   Encounter for peripheral line placement 03/25/2020   PICC (peripherally inserted central catheter) in place 03/18/2020   Leukopenia 03/18/2020   Osteomyelitis, jaw acute    Osteonecrosis (HCC)    Confusion 02/20/2020   Facial cellulitis 02/19/2020   Type 2 diabetes mellitus (HCC)    Coronary artery disease    Hypertension    Stage 3b chronic kidney disease (HCC)    Hypomagnesemia    Hypophosphatemia    Hypokalemia 01/03/2020   CKD (chronic kidney disease) 05/01/2019   B12 deficiency 02/09/2018   Multiple myeloma not having achieved remission (HCC) 01/20/2018   Iron deficiency anemia 11/25/2017   PCP:  Katrinka Aquas, MD Pharmacy:   [facility] - [place], [place] - [address] [place] [address] Phone: [phone] Fax: [phone]  [facility] [phone] - [place], [place] - [address] AT [facility] [address] [place] [address] Phone: [phone] Fax: [phone]  [facility] - [place], [place] - [address] [place] [address] Phone: [phone] Fax: [phone]     Social Drivers of Health (SDOH) Social History: SDOH Screenings   Food Insecurity: No Food Insecurity (07/14/2024)  Housing: Low Risk  (07/14/2024)  Transportation Needs: No Transportation Needs (07/14/2024)  Utilities: Not At Risk (07/14/2024)  Alcohol Screen: Low Risk  (12/09/2020)  Depression (PHQ2-9): Low Risk  (06/29/2024)  Financial Resource Strain: Low Risk  (12/09/2020)  Physical Activity: Inactive (12/09/2020)  Social Connections: Moderately Isolated (07/15/2024)  Stress: No Stress Concern Present (12/09/2020)  Tobacco Use: Low Risk  (07/14/2024)   SDOH Interventions:     Readmission Risk Interventions     No data to display

## 2024-07-15 NOTE — Evaluation (Signed)
 Physical Therapy Evaluation Patient Details Name: Brittany Archer MRN: [id number] DOB: 12-08-1941 Today's Date: 07/15/2024  History of Present Illness  Brittany Archer is a 83 y.o. female with medical history significant for type 2 diabetes, CAD, bilateral breast cancer, multiple myeloma, and cognitive impairment, who was recently discharged on 6/2 to SNF after finding of a closed compression fracture to L2 lumbar vertebra and at that time was also noted to have a Klebsiella UTI.  She was discharged back to home after a 2-week stay.  She presented to the ED today with worsening low back pain and now inability to get up out of the bed due to the severity of her pain.  Sister at bedside states that this past Wednesday on 7/16, she was getting out of the car and she twisted and acutely injured her back.  Since then she has been more or less been resting in bed, but since this morning, could not get up out of bed due to the severity of her pain and had urinated on herself.  Her mentation is at baseline, per the sister at bedside.  She has not had any bowel movements in the last 1-2 days.  Patient denies any abdominal pain, nausea, vomiting, or change in bowel movements.  She denies any fevers or chills.   Clinical Impression  Patient agreeable to and tolerated PT session well, although limited due to pain. Patient was received supine in bed. Required max assist to donn TLSO, and moderate assist for bed mobility, and functional transfers due to back pain and general weakness. Once on her feet, patient required min assist due to unsteadiness. Pain limits ambulation distance as well. Patient is a poor historian due to noted cognitive change, but she reports she is unsure of the amount of physical assist that can be given at home with family. Patient tolerated sitting up in chair after session with chair alarm set and nursing notified. Patient will benefit from continued skilled physical therapy acutely and in  recommended venue in order to address current deficits for safe return home.        If plan is discharge home, recommend the following: A lot of help with walking and/or transfers;A lot of help with bathing/dressing/bathroom;Assistance with cooking/housework;Assist for transportation;Direct supervision/assist for medications management;Help with stairs or ramp for entrance;Supervision due to cognitive status   Can travel by private vehicle   No    Equipment Recommendations None recommended by PT;Other (comment) (Can be recommended by next venue)  Recommendations for Other Services       Functional Status Assessment Patient has had a recent decline in their functional status and demonstrates the ability to make significant improvements in function in a reasonable and predictable amount of time.     Precautions / Restrictions Precautions Precautions: Fall Recall of Precautions/Restrictions: Impaired Required Braces or Orthoses: Spinal Brace Spinal Brace: Thoracolumbosacral orthotic Restrictions Weight Bearing Restrictions Per Provider Order: No Other Position/Activity Restrictions: TLSO when OOB, max A, applied/adjusted in supine/sitting      Mobility  Bed Mobility Overal bed mobility: Needs Assistance Bed Mobility: Supine to Sit     Supine to sit: Mod assist     General bed mobility comments: Slow, painful and labored movement due to pain. Mod A due to pain and weakness. Max A required to donn TLSO    Transfers Overall transfer level: Needs assistance Equipment used: Rolling walker (2 wheels) Transfers: Sit to/from Stand, Bed to chair/wheelchair/BSC Sit to Stand: Mod assist   Step  pivot transfers: Min assist       General transfer comment: Slow, labored, and painful movement. Mod A for STS due to pain and LE weakness. Min A during bed>chair due to mild unsteadiness    Ambulation/Gait Ambulation/Gait assistance: Min Chemical engineer (Feet): 3 Feet Assistive  device: Rolling walker (2 wheels) Gait Pattern/deviations: Step-to pattern, Decreased step length - right, Decreased step length - left Gait velocity: Dec     General Gait Details: Limited to very short side steps at bedside due to inc pain. pt unsteady on feet, requiring min A and support of RW  Stairs            Wheelchair Mobility     Tilt Bed    Modified Rankin (Stroke Patients Only)       Balance Overall balance assessment: Needs assistance Sitting-balance support: Feet supported, No upper extremity supported Sitting balance-Leahy Scale: Fair Sitting balance - Comments: Seated EOB , posterior lean at times   Standing balance support: During functional activity, Reliant on assistive device for balance, Bilateral upper extremity supported Standing balance-Leahy Scale: Fair Standing balance comment: w/ RW and min A for steadying               Pertinent Vitals/Pain Pain Assessment Pain Assessment: Faces Faces Pain Scale: Hurts even more Pain Location: Pt initially with no pain but inc pain of note with mobility Pain Descriptors / Indicators: Sharp, Discomfort Pain Intervention(s): Limited activity within patient's tolerance, Repositioned    Home Living Family/patient expects to be discharged to:: Private residence Living Arrangements: Other relatives;Other (Comment) (Sister and brother in law) Available Help at Discharge: Family;Available 24 hours/day Type of Home: House Home Access: Level entry       Home Layout: One level Home Equipment: Grab bars - tub/shower;Shower seat Additional Comments: Pt poor historian. Does not recall recent stay at SNF. Above info collected via previous admission, providers note, and pt report. PT reports sister needs help with walking and not sure if she can assist pt    Prior Function Prior Level of Function : Patient poor historian/Family not available             Mobility Comments: Pt reports independnce without AD  prior to and since discharge from SNF ADLs Comments: Pt reports independent     Extremity/Trunk Assessment   Upper Extremity Assessment Upper Extremity Assessment: Defer to OT evaluation    Lower Extremity Assessment Lower Extremity Assessment: Generalized weakness (Ankle DF MMT 5/5 bilat, hip and shoulder flexion 4-/5 bilat. Shoulder flexion ROM moderately limited and increases LBP, ~100 deg)    Cervical / Trunk Assessment Cervical / Trunk Assessment: Kyphotic  Communication   Communication Communication: No apparent difficulties    Cognition Arousal: Alert Behavior During Therapy: WFL for tasks assessed/performed   PT - Cognitive impairments: History of cognitive impairments, No family/caregiver present to determine baseline       PT - Cognition Comments: Alert and oriented to self and place only, disoriented to time and situation Following commands: Intact       Cueing Cueing Techniques: Verbal cues, Tactile cues, Visual cues     General Comments      Exercises     Assessment/Plan    PT Assessment Patient needs continued PT services;All further PT needs can be met in the next venue of care  PT Problem List Decreased strength;Decreased range of motion;Decreased activity tolerance;Decreased balance;Decreased mobility;Pain;Decreased cognition       PT Treatment Interventions DME instruction;Gait training;Functional  mobility training;Therapeutic activities;Therapeutic exercise;Balance training;Patient/family education    PT Goals (Current goals can be found in the Care Plan section)  Acute Rehab PT Goals Patient Stated Goal: Return home following rehab stay PT Goal Formulation: With patient Time For Goal Achievement: 07/29/24 Potential to Achieve Goals: Good    Frequency Min 3X/week     Co-evaluation               AM-PAC PT 6 Clicks Mobility  Outcome Measure Help needed turning from your back to your side while in a flat bed without using  bedrails?: A Little Help needed moving from lying on your back to sitting on the side of a flat bed without using bedrails?: A Lot Help needed moving to and from a bed to a chair (including a wheelchair)?: A Little Help needed standing up from a chair using your arms (e.g., wheelchair or bedside chair)?: A Lot Help needed to walk in hospital room?: A Little Help needed climbing 3-5 steps with a railing? : A Lot 6 Click Score: 15    End of Session Equipment Utilized During Treatment: Gait belt;Back brace Activity Tolerance: Patient tolerated treatment well;Patient limited by pain Patient left: in chair;with call bell/phone within reach;with chair alarm set Nurse Communication: Mobility status PT Visit Diagnosis: Unsteadiness on feet (R26.81);Other abnormalities of gait and mobility (R26.89);Muscle weakness (generalized) (M62.81);Difficulty in walking, not elsewhere classified (R26.2);Pain Pain - part of body:  (Back)    Time: 9167-9092 PT Time Calculation (min) (ACUTE ONLY): 35 min   Charges:   PT Evaluation $PT Eval Moderate Complexity: 1 Mod PT Treatments $Therapeutic Activity: 23-37 mins PT General Charges $$ ACUTE PT VISIT: 1 Visit        10:04 AM, 07/15/24 Rosaria Settler, PT, DPT [place] with [facility]

## 2024-07-15 NOTE — Care Management Obs Status (Signed)
 MEDICARE OBSERVATION STATUS NOTIFICATION   Patient Details  Name: Brittany Archer MRN: [id number] Date of Birth: 08/15/1941   Medicare Observation Status Notification Given:  Yes    Nena LITTIE Coffee, RN 07/15/2024, 11:48 AM

## 2024-07-16 DIAGNOSIS — S32020A Wedge compression fracture of second lumbar vertebra, initial encounter for closed fracture: Secondary | ICD-10-CM | POA: Diagnosis not present

## 2024-07-16 LAB — GLUCOSE, CAPILLARY
Glucose-Capillary: 109 mg/dL — ABNORMAL HIGH (ref 70–99)
Glucose-Capillary: 129 mg/dL — ABNORMAL HIGH (ref 70–99)

## 2024-07-16 MED ORDER — CEPHALEXIN 500 MG PO CAPS
500.0000 mg | ORAL_CAPSULE | Freq: Two times a day (BID) | ORAL | Status: AC
Start: 2024-07-16 — End: 2024-07-21

## 2024-07-16 MED ORDER — TRAMADOL HCL 50 MG PO TABS: 50.0000 mg | ORAL_TABLET | Freq: Two times a day (BID) | ORAL | 0 refills | Status: DC | PRN

## 2024-07-16 NOTE — Plan of Care (Signed)
  Problem: Education: Goal: Knowledge of General Education information will improve Description: Including pain rating scale, medication(s)/side effects and non-pharmacologic comfort measures Outcome: Adequate for Discharge   Problem: Health Behavior/Discharge Planning: Goal: Ability to manage health-related needs will improve Outcome: Adequate for Discharge   Problem: Clinical Measurements: Goal: Ability to maintain clinical measurements within normal limits will improve Outcome: Adequate for Discharge Goal: Will remain free from infection Outcome: Adequate for Discharge Goal: Diagnostic test results will improve Outcome: Adequate for Discharge Goal: Respiratory complications will improve Outcome: Adequate for Discharge Goal: Cardiovascular complication will be avoided Outcome: Adequate for Discharge   Problem: Activity: Goal: Risk for activity intolerance will decrease Outcome: Adequate for Discharge   Problem: Nutrition: Goal: Adequate nutrition will be maintained Outcome: Adequate for Discharge   Problem: Coping: Goal: Level of anxiety will decrease Outcome: Adequate for Discharge   Problem: Elimination: Goal: Will not experience complications related to bowel motility Outcome: Adequate for Discharge Goal: Will not experience complications related to urinary retention Outcome: Adequate for Discharge   Problem: Pain Managment: Goal: General experience of comfort will improve and/or be controlled Outcome: Adequate for Discharge   Problem: Safety: Goal: Ability to remain free from injury will improve Outcome: Adequate for Discharge   Problem: Skin Integrity: Goal: Risk for impaired skin integrity will decrease Outcome: Adequate for Discharge   Problem: Education: Goal: Ability to describe self-care measures that may prevent or decrease complications (Diabetes Survival Skills Education) will improve Outcome: Adequate for Discharge Goal: Individualized Educational  Video(s) Outcome: Adequate for Discharge   Problem: Coping: Goal: Ability to adjust to condition or change in health will improve Outcome: Adequate for Discharge   Problem: Fluid Volume: Goal: Ability to maintain a balanced intake and output will improve Outcome: Adequate for Discharge   Problem: Health Behavior/Discharge Planning: Goal: Ability to identify and utilize available resources and services will improve Outcome: Adequate for Discharge Goal: Ability to manage health-related needs will improve Outcome: Adequate for Discharge   Problem: Metabolic: Goal: Ability to maintain appropriate glucose levels will improve Outcome: Adequate for Discharge   Problem: Nutritional: Goal: Maintenance of adequate nutrition will improve Outcome: Adequate for Discharge Goal: Progress toward achieving an optimal weight will improve Outcome: Adequate for Discharge   Problem: Skin Integrity: Goal: Risk for impaired skin integrity will decrease Outcome: Adequate for Discharge   Problem: Tissue Perfusion: Goal: Adequacy of tissue perfusion will improve Outcome: Adequate for Discharge   Problem: Acute Rehab PT Goals(only PT should resolve) Goal: Pt Will Go Supine/Side To Sit Outcome: Adequate for Discharge Goal: Patient Will Transfer Sit To/From Stand Outcome: Adequate for Discharge Goal: Pt Will Transfer Bed To Chair/Chair To Bed Outcome: Adequate for Discharge Goal: Pt Will Ambulate Outcome: Adequate for Discharge

## 2024-07-16 NOTE — TOC Transition Note (Incomplete)
 Transition of Care [facility]) - Discharge Note   Patient Details  Name: Brittany Archer MRN: [id number] Date of Birth: 06/25/1941  [facility]) CM/SW Contact:  Nena LITTIE Coffee, RN Phone Number: 07/16/2024, 12:16 PM   Clinical Narrative:    Pt admitted c/lumbar fracture will dc to [facility]. Staff and family updated. [facility] to transport.    Final next level of care: Skilled Nursing Facility Barriers to Discharge: Barriers Resolved   Patient Goals and CMS Choice     Choice offered to / list presented to : Patient, Sibling Severance ownership interest in [facility].provided to:: Sibling    Discharge Placement                       Discharge Plan and Services Additional resources added to the After Visit Summary for   In-house Referral: Clinical Social Work Discharge Planning Services: CM Consult Post Acute Care Choice: Skilled Nursing Facility                               Social Drivers of Health (SDOH) Interventions SDOH Screenings   Food Insecurity: No Food Insecurity (07/14/2024)  Housing: Low Risk  (07/14/2024)  Transportation Needs: No Transportation Needs (07/14/2024)  Utilities: Not At Risk (07/14/2024)  Alcohol Screen: Low Risk  (12/09/2020)  Depression (PHQ2-9): Low Risk  (06/29/2024)  Financial Resource Strain: Low Risk  (12/09/2020)  Physical Activity: Inactive (12/09/2020)  Social Connections: Moderately Isolated (07/15/2024)  Stress: No Stress Concern Present (12/09/2020)  Tobacco Use: Low Risk  (07/14/2024)     Readmission Risk Interventions     No data to display

## 2024-07-16 NOTE — Plan of Care (Signed)
   Problem: Health Behavior/Discharge Planning: Goal: Ability to manage health-related needs will improve Outcome: Progressing

## 2024-07-16 NOTE — Progress Notes (Signed)
 [facility] PROGRESS NOTE  Brittany Archer (DOB: 05-Aug-1941) FMW:[id number] PCP: Katrinka Aquas, MD  Brief Narrative: HPI: Brittany Archer is a 83 y.o. female with medical history significant for type 2 diabetes, CAD, bilateral breast cancer, multiple myeloma, and cognitive impairment, who was recently discharged on 6/2 to SNF after finding of a closed compression fracture to L2 lumbar vertebra and at that time was also noted to have a Klebsiella UTI.  She was discharged back to home after a 2-week stay.  She presented to the ED today with worsening low back pain and now inability to get up out of the bed due to the severity of her pain.  Sister at bedside states that this past Wednesday on 7/16, she was getting out of the car and she twisted and acutely injured her back.  Since then she has been more or less been resting in bed, but since this morning, could not get up out of bed due to the severity of her pain and had urinated on herself.  Her mentation is at baseline, per the sister at bedside.  She has not had any bowel movements in the last 1-2 days.  Patient denies any abdominal pain, nausea, vomiting, or change in bowel movements.  She denies any fevers or chills.    ED Course: Vital signs stable and patient afebrile.  WBC 3.8 and hemoglobin 9.8.  CT of the abdomen pelvis concerning for new endplate deformities at L3 and L5.  No other acute findings noted.  She was given some oral pain medications for relief of her pain and started on oral Keflex .  Family members were concerned about her intractable weakness and inability to ambulate.  Sister is at bedside and is the primary historian.  Hospital course: TLSO brace applied and patient has had PT evaluation, recommending SNF rehabilitation which will be pursued.   Subjective: No complaints. Pain controlled, up in chair this AM, ate breakfast normally. No family at bedside this AM.  Objective: BP (!) 152/67 (BP Location: Right Arm)    Pulse 60   Temp 97.7 F (36.5 C) (Oral)   Resp 16   Ht 5' 4 (1.626 m)   Wt 63.8 kg   SpO2 100%   BMI 24.14 kg/m   Gen: No distress Pulm: Nonlabored GI: Soft, NT, ND, +BS  Neuro: Alert and interactive, cooperative with exam. Strength 5/5, SILT Patellar reflexes grossly symmetrical.   Assessment & Plan: Intractable low back pain due to acute L3 and L5 compression fractures: MRI to lumbar spine confirmed new wedge compression fractures at L3 and L5 with less than 25% height loss and unchanged mild L2 compression deformity. There are other areas of stenosis without neurological compromise on exam.  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  - TLSO brace in place, continue PT. TOC working on SNF placement. The patient is medically stable for discharge once appropriate venue is confirmed.  - Fall precautions   Possible UTI: No culture sent unfortunately.  - Recent discharge on 6/2 with Klebsiella UTI that was sensitive to keflex . Will complete typical course again.    Hypokalemia:  - Supplemented, recheck later this week.   CAD - No chest pain currently - Continue aspirin  and statin   Type 2 diabetes - Currently without hyperglycemia - Hold home metformin  - Carb modified diet and SSI   Cognitive impairment - Continue memantine  - Monitor  for behavioral disturbances   Multiple myeloma - Follows with Dr. Rogers   Bilateral breast cancer-stage II - Follows with Dr. Rogers, definitive breast cancer management withheld for now given worsening myeloma - Continue anastrozole    Chronic macrocytic anemia - Felt to be due to myelosuppression - Hemoglobin at baseline around 9-10, stable.    GERD - PPI  Bernardino [name], MD [facility] [email or web address] 07/16/2024, 8:33 AM

## 2024-07-16 NOTE — Discharge Summary (Addendum)
 Physician Discharge Summary   Patient: Brittany Archer MRN: [id number] DOB: 26-Apr-1941  Admit date:     07/14/2024  Discharge date: 07/16/24  Discharge Physician: Bernardino [name]   PCP: Katrinka Aquas, MD   Recommendations at discharge:  Follow up with PCP in 1-2 weeks Continue PT/OT at [facility] Continue routine follow up for multiple myeloma.  Recheck BMP, CBC in the next week.   Discharge Diagnoses: Principal Problem:   Closed compression fracture of L2 lumbar vertebra, initial encounter (HCC) Active Problems:   Type 2 diabetes mellitus (HCC)   CKD (chronic kidney disease), stage III B   UTI (urinary tract infection)   Dementia without behavioral disturbance (HCC)   Coronary artery disease   Osteonecrosis (HCC)   Leukopenia   Bilateral breast cancer (HCC)   GERD (gastroesophageal reflux disease)  Hospital Course: HPI: Brittany Archer is a 83 y.o. female with medical history significant for type 2 diabetes, CAD, bilateral breast cancer, multiple myeloma, and cognitive impairment, who was recently discharged on 6/2 to SNF after finding of a closed compression fracture to L2 lumbar vertebra and at that time was also noted to have a Klebsiella UTI.  She was discharged back to home after a 2-week stay.  She presented to the ED today with worsening low back pain and now inability to get up out of the bed due to the severity of her pain.  Sister at bedside states that this past Wednesday on 7/16, she was getting out of the car and she twisted and acutely injured her back.  Since then she has been more or less been resting in bed, but since this morning, could not get up out of bed due to the severity of her pain and had urinated on herself.  Her mentation is at baseline, per the sister at bedside.  She has not had any bowel movements in the last 1-2 days.  Patient denies any abdominal pain, nausea, vomiting, or change in bowel movements.  She denies any fevers or chills.    ED Course: Vital  signs stable and patient afebrile.  WBC 3.8 and hemoglobin 9.8.  CT of the abdomen pelvis concerning for new endplate deformities at L3 and L5.  No other acute findings noted.  She was given some oral pain medications for relief of her pain and started on oral Keflex .  Family members were concerned about her intractable weakness and inability to ambulate.  Sister is at bedside and is the primary historian.   Hospital course: TLSO brace applied and patient has had PT evaluation, recommending SNF rehabilitation which will be pursued.   Subjective: No complaints. Pain controlled, up in chair this AM, ate breakfast normally. No family at bedside this AM.   Objective: BP (!) 152/67 (BP Location: Right Arm)   Pulse 60   Temp 97.7 F (36.5 C) (Oral)   Resp 16   Ht 5' 4 (1.626 m)   Wt 63.8 kg   SpO2 100%   BMI 24.14 kg/m   Gen: No distress Pulm: Nonlabored GI: Soft, NT, ND, +BS  Neuro: Alert and interactive, cooperative with exam. Strength 5/5, SILT Patellar reflexes grossly symmetrical.    Assessment & Plan: Intractable low back pain due to acute L3 and L5 compression fractures: MRI to lumbar spine confirmed new wedge compression fractures at L3 and L5 with less than 25% height loss and unchanged mild L2 compression deformity. There are other areas of stenosis without neurological compromise on exam.  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  - TLSO brace in place, continue PT. TOC working on SNF placement. The patient is medically stable for discharge once appropriate venue is confirmed.  - Fall precautions   Possible UTI: No culture sent unfortunately.  - Recent discharge on 6/2 with Klebsiella UTI that was sensitive to keflex . Will complete typical course again.    Hypokalemia:  - Supplemented, continue supplement, recheck later this week.    CAD - No chest pain  currently - Continue aspirin  and statin   Type 2 diabetes - Currently without hyperglycemia, continue metformin .   Cognitive impairment - Continue memantine     Multiple myeloma - Follows with Dr. Rogers   Bilateral breast cancer-stage II - Follows with Dr. Rogers, definitive breast cancer management withheld for now given worsening myeloma - Continue anastrozole    Chronic macrocytic anemia - Felt to be due to myelosuppression - Hemoglobin at baseline around 9-10, stable.    GERD - PPI  Consultants: None Procedures performed: None  Disposition: Skilled nursing facility Diet recommendation: Heart healthy, carb-modified DISCHARGE MEDICATION: Allergies as of 07/16/2024       Reactions   Motrin [ibuprofen] Rash   Seasonal Ic [cholestatin] Other (See Comments)   Sneezing, watery eyes        Medication List     TAKE these medications    Accu-Chek Aviva Plus test strip Generic drug: glucose blood CHECK BLOOD SUGAR ONCE DAILY   acetaminophen  500 MG tablet Commonly known as: TYLENOL  Take 2 tablets (1,000 mg total) by mouth every 8 (eight) hours.   anastrozole  1 MG tablet Commonly known as: ARIMIDEX  Take 1 tablet (1 mg total) by mouth daily.   Aspirin  Low Dose 81 MG tablet Generic drug: aspirin  EC Take 81 mg by mouth daily.   atorvastatin  10 MG tablet Commonly known as: LIPITOR Take 10 mg by mouth daily.   bortezomib  IV 3.5 MG injection Commonly known as: VELCADE  Inject 3.5 mg into the vein once a week. weekly   cephALEXin  500 MG capsule Commonly known as: KEFLEX  Take 1 capsule (500 mg total) by mouth 2 (two) times daily for 5 days. What changed:  when to take this additional instructions   famotidine  20 MG tablet Commonly known as: PEPCID  Take 20 mg by mouth at bedtime as needed for heartburn or indigestion.   lenalidomide  10 MG capsule Commonly known as: REVLIMID  Take 1 capsule (10 mg total) by mouth daily. Take daily for 21 days, with 7  days off   magnesium  oxide 400 (240 Mg) MG tablet Commonly known as: MAG-OX TAKE ONE TABLET BY MOUTH THREE TIMES A DAY   memantine  5 MG tablet Commonly known as: NAMENDA  Take 5 mg by mouth daily.   metFORMIN  500 MG tablet Commonly known as: GLUCOPHAGE  Take 500 mg by mouth at bedtime.   pantoprazole  40 MG tablet Commonly known as: PROTONIX  Take 1 tablet (40 mg total) by mouth daily.   potassium chloride  SA 20 MEQ tablet Commonly known as: KLOR-CON  M Take 20 mEq by mouth 2 (two) times daily.   traMADol  50 MG tablet Commonly known as: ULTRAM  Take 1 tablet (50 mg total) by mouth every 12 (twelve) hours as needed for moderate pain (pain score 4-6) or severe pain (pain score 7-10).        Follow-up Information     Katrinka Aquas, MD Follow up.   Specialty: Internal Medicine  Contact information: [address] [place] [address] [phone]                Condition at discharge: stable  The results of significant diagnostics from this hospitalization (including imaging, microbiology, ancillary and laboratory) are listed below for reference.   Imaging Studies: MR Lumbar Spine W Wo Contrast Result Date: 07/14/2024 EXAM: MRI LUMBAR SPINE 07/14/2024 05:21:15 PM TECHNIQUE: Multiplanar multisequence MRI of the lumbar spine was performed with and without the administration of intravenous contrast. 6mL Gadobutrol  (GADAVIST ) 1 MMOL/ML IV solution was used. COMPARISON: 05/27/2024 CLINICAL HISTORY: Compression fracture, lumbar. F/u New inferior endplate deformity of L3 and superior endplate deformity of L5 seen on CT scan today. FINDINGS: BONES AND ALIGNMENT: Mild compression deformity at L2 is unchanged. There is new bone marrow edema and height loss at the inferior endplate of L3 and the superior endplate of L5 with less than 25% height loss. Grade 1 anterolisthesis at L4-5 and L5-S1. SPINAL CORD: The conus terminates normally. SOFT TISSUES: No paraspinal mass. L1-L2: No  significant disc herniation. No spinal canal stenosis or neural foraminal narrowing. L2-L3: Small disc bulge without stenosis. L3-L4: Small disc bulge with mild bilateral foraminal narrowing. Mild left facet hypertrophy. L4-L5: Moderate facet hypertrophy with moderate spinal canal stenosis and mild bilateral foraminal stenosis. L5-S1: Moderate facet hypertrophy. Severe bilateral foraminal stenosis. Right lateral recess narrowing. IMPRESSION: 1. New wedge compression fractures at L3 and L5 with less than 25% height loss. 2. Mild compression deformity at L2, unchanged. 3. Grade 1 anterolisthesis at L4-5 and L5-S1. 4. 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 5. Severe bilateral foraminal stenosis at L5-S1. Electronically signed by: Franky Stanford MD 07/14/2024 07:15 PM EDT RP Workstation: [id number]   CT ABDOMEN PELVIS W CONTRAST Result Date: 07/14/2024 CLINICAL DATA:  Abdominal pain, discharge, UTI and back pain for 2 months, history of multiple myeloma * Tracking Code: BO * EXAM: CT ABDOMEN AND PELVIS WITH CONTRAST TECHNIQUE: Multidetector CT imaging of the abdomen and pelvis was performed using the standard protocol following bolus administration of intravenous contrast. RADIATION DOSE REDUCTION: This exam was performed according to the departmental dose-optimization program which includes automated exposure control, adjustment of the mA and/or kV according to patient size and/or use of iterative reconstruction technique. CONTRAST:  OMNIPAQUE  IOHEXOL  300 MG/ML  SOLN COMPARISON:  05/27/2024 FINDINGS: Lower chest: No acute abnormality.  Coronary artery calcifications. Hepatobiliary: No solid liver abnormality is seen. No gallstones, gallbladder wall thickening, or biliary dilatation. Faintly calcified gallstones seen on prior examination are not clearly appreciated today. Pancreas: Unremarkable. No pancreatic ductal dilatation or surrounding inflammatory changes. Spleen: Normal in size without significant  abnormality. Adrenals/Urinary Tract: Adrenal glands are unremarkable. Simple, benign left renal cortical cysts for which no further follow-up or characterization is required. Kidneys are otherwise normal, without renal calculi, solid lesion, or hydronephrosis. Bladder is unremarkable. Stomach/Bowel: Stomach is within normal limits. Appendix not clearly visualized. No evidence of bowel wall thickening, distention, or inflammatory changes. Descending and sigmoid diverticulosis. Vascular/Lymphatic: Aortic atherosclerosis. No enlarged abdominal or pelvic lymph nodes. Reproductive: Hysterectomy. Other: No abdominal wall hernia or abnormality. No ascites. Musculoskeletal: No acute or significant osseous findings. Unchanged superior and inferior endplate deformities of L2. New inferior endplate deformity of L3 and superior endplate deformity of L5. IMPRESSION: 1. No acute CT findings of the abdominal or pelvic organs to explain abdominal pain, discharge, or hematuria. 2. Descending and sigmoid diverticulosis without evidence of acute diverticulitis. 3. Unchanged superior and inferior endplate deformities of  L2. New inferior endplate deformity of L3 and superior endplate deformity of L5. Correlate for acutely referable pain and point tenderness. Consider MRI to assess for fracture acuity and underlying lesion in this patient with history of multiple myeloma. 4. Coronary artery disease. Aortic Atherosclerosis (ICD10-I70.0). Electronically Signed   By: Marolyn JONETTA Jaksch M.D.   On: 07/14/2024 12:14    Microbiology: Results for orders placed or performed during the hospital encounter of 05/27/24  Urine Culture     Status: Abnormal   Collection Time: 05/27/24 11:21 AM   Specimen: Urine, Clean Catch  Result Value Ref Range Status   Specimen Description   Final    URINE, CLEAN CATCH Performed at [facility], [address], [place], [place] [address]    Special Requests   Final    NONE Performed at [facility],  [address] [place], [place] [address]    Culture >=100,000 COLONIES/mL KLEBSIELLA PNEUMONIAE (A)  Final   Report Status 05/29/2024 FINAL  Final   Organism ID, Bacteria KLEBSIELLA PNEUMONIAE (A)  Final      Susceptibility   Klebsiella pneumoniae - MIC*    AMPICILLIN  >=32 RESISTANT Resistant     CEFAZOLIN <=4 SENSITIVE Sensitive     CEFEPIME <=0.12 SENSITIVE Sensitive     CEFTRIAXONE  <=0.25 SENSITIVE Sensitive     CIPROFLOXACIN <=0.25 SENSITIVE Sensitive     GENTAMICIN <=1 SENSITIVE Sensitive     IMIPENEM <=0.25 SENSITIVE Sensitive     NITROFURANTOIN 64 INTERMEDIATE Intermediate     TRIMETH /SULFA  <=20 SENSITIVE Sensitive     AMPICILLIN /SULBACTAM 4 SENSITIVE Sensitive     PIP/TAZO <=4 SENSITIVE Sensitive ug/mL    * >=100,000 COLONIES/mL KLEBSIELLA PNEUMONIAE   *Note: Due to a large number of results and/or encounters for the requested time period, some results have not been displayed. A complete set of results can be found in Results Review.    Labs: CBC: Recent Labs  Lab 07/14/24 1056 07/15/24 0316  WBC 3.8* 3.1*  HGB 9.8* 9.6*  HCT 30.9* 29.3*  MCV 100.3* 99.3  PLT 151 149*   Basic Metabolic Panel: Recent Labs  Lab 07/14/24 1056 07/15/24 0316  NA 139 137  K 3.7 3.0*  CL 104 104  CO2 22 24  GLUCOSE 100* 82  BUN 13 10  CREATININE 0.96 0.87  CALCIUM  9.1 8.7*  MG  --  1.7   Liver Function Tests: Recent Labs  Lab 07/14/24 1056  AST 12*  ALT 14  ALKPHOS 120  BILITOT 1.0  PROT 6.5  ALBUMIN 2.8*   CBG: Recent Labs  Lab 07/15/24 1134 07/15/24 1609 07/15/24 2106 07/16/24 0711 07/16/24 1112  GLUCAP 180* 127* 114* 109* 129*    Discharge time spent: greater than 30 minutes.  Signed: Bernardino [name], MD [facility] 07/16/2024

## 2024-07-16 NOTE — Progress Notes (Signed)
 Spoke with sister Tonda Law about patients transport to [facility] scheduled for today

## 2024-07-16 NOTE — TOC CM/SW Note (Signed)
 Auth [id number] [facility] 7/20-7/23/2025

## 2024-07-17 ENCOUNTER — Other Ambulatory Visit: Payer: Self-pay | Admitting: Hematology

## 2024-07-17 DIAGNOSIS — C9000 Multiple myeloma not having achieved remission: Secondary | ICD-10-CM

## 2024-07-18 ENCOUNTER — Other Ambulatory Visit: Payer: Self-pay

## 2024-07-18 DIAGNOSIS — C9000 Multiple myeloma not having achieved remission: Secondary | ICD-10-CM

## 2024-07-18 MED ORDER — LENALIDOMIDE 10 MG PO CAPS: 10.0000 mg | ORAL_CAPSULE | Freq: Every day | ORAL | 0 refills | Status: DC

## 2024-07-18 NOTE — Telephone Encounter (Signed)
 Chart reviewed. Revlimid refilled per last office note with Dr. Ellin Saba.

## 2024-07-27 ENCOUNTER — Inpatient Hospital Stay

## 2024-07-27 ENCOUNTER — Telehealth: Payer: Self-pay

## 2024-07-27 ENCOUNTER — Inpatient Hospital Stay (HOSPITAL_BASED_OUTPATIENT_CLINIC_OR_DEPARTMENT_OTHER): Admitting: Hematology

## 2024-07-27 ENCOUNTER — Encounter: Payer: Self-pay | Admitting: Hematology

## 2024-07-27 VITALS — BP 163/69 | HR 73 | Temp 97.8°F | Resp 16

## 2024-07-27 DIAGNOSIS — Z170 Estrogen receptor positive status [ER+]: Secondary | ICD-10-CM

## 2024-07-27 DIAGNOSIS — C50212 Malignant neoplasm of upper-inner quadrant of left female breast: Secondary | ICD-10-CM

## 2024-07-27 DIAGNOSIS — C9000 Multiple myeloma not having achieved remission: Secondary | ICD-10-CM

## 2024-07-27 DIAGNOSIS — Z5112 Encounter for antineoplastic immunotherapy: Secondary | ICD-10-CM | POA: Diagnosis present

## 2024-07-27 LAB — CBC WITH DIFFERENTIAL/PLATELET
Abs Immature Granulocytes: 0.03 K/uL (ref 0.00–0.07)
Basophils Absolute: 0 K/uL (ref 0.0–0.1)
Basophils Relative: 0 %
Eosinophils Absolute: 0.1 K/uL (ref 0.0–0.5)
Eosinophils Relative: 1 %
HCT: 32.3 % — ABNORMAL LOW (ref 36.0–46.0)
Hemoglobin: 10.2 g/dL — ABNORMAL LOW (ref 12.0–15.0)
Immature Granulocytes: 1 %
Lymphocytes Relative: 32 %
Lymphs Abs: 1.4 K/uL (ref 0.7–4.0)
MCH: 32.2 pg (ref 26.0–34.0)
MCHC: 31.6 g/dL (ref 30.0–36.0)
MCV: 101.9 fL — ABNORMAL HIGH (ref 80.0–100.0)
Monocytes Absolute: 0.3 K/uL (ref 0.1–1.0)
Monocytes Relative: 7 %
Neutro Abs: 2.7 K/uL (ref 1.7–7.7)
Neutrophils Relative %: 59 %
Platelets: 284 K/uL (ref 150–400)
RBC: 3.17 MIL/uL — ABNORMAL LOW (ref 3.87–5.11)
RDW: 17.7 % — ABNORMAL HIGH (ref 11.5–15.5)
WBC: 4.6 K/uL (ref 4.0–10.5)
nRBC: 0 % (ref 0.0–0.2)

## 2024-07-27 LAB — COMPREHENSIVE METABOLIC PANEL WITH GFR
ALT: 9 U/L (ref 0–44)
AST: 14 U/L — ABNORMAL LOW (ref 15–41)
Albumin: 2.8 g/dL — ABNORMAL LOW (ref 3.5–5.0)
Alkaline Phosphatase: 160 U/L — ABNORMAL HIGH (ref 38–126)
Anion gap: 10 (ref 5–15)
BUN: 19 mg/dL (ref 8–23)
CO2: 22 mmol/L (ref 22–32)
Calcium: 9.4 mg/dL (ref 8.9–10.3)
Chloride: 107 mmol/L (ref 98–111)
Creatinine, Ser: 1.18 mg/dL — ABNORMAL HIGH (ref 0.44–1.00)
GFR, Estimated: 46 mL/min — ABNORMAL LOW (ref >60)
Glucose, Bld: 166 mg/dL — ABNORMAL HIGH (ref 70–99)
Potassium: 3.2 mmol/L — ABNORMAL LOW (ref 3.5–5.1)
Sodium: 139 mmol/L (ref 135–145)
Total Bilirubin: 0.8 mg/dL (ref 0.0–1.2)
Total Protein: 7.1 g/dL (ref 6.5–8.1)

## 2024-07-27 LAB — MAGNESIUM: Magnesium: 2.1 mg/dL (ref 1.7–2.4)

## 2024-07-27 MED ORDER — PROCHLORPERAZINE MALEATE 10 MG PO TABS
10.0000 mg | ORAL_TABLET | Freq: Once | ORAL | Status: AC
  Administered 2024-07-27: 10 mg via ORAL
  Filled 2024-07-27: qty 1

## 2024-07-27 MED ORDER — DEXAMETHASONE 4 MG PO TABS
20.0000 mg | ORAL_TABLET | Freq: Once | ORAL | Status: AC
  Administered 2024-07-27: 20 mg via ORAL
  Filled 2024-07-27: qty 5

## 2024-07-27 MED ORDER — BORTEZOMIB CHEMO SQ INJECTION 3.5 MG (2.5MG/ML)
1.3000 mg/m2 | Freq: Once | INTRAMUSCULAR | Status: AC
  Administered 2024-07-27: 2.25 mg via SUBCUTANEOUS
  Filled 2024-07-27: qty 0.9

## 2024-07-27 MED ORDER — POTASSIUM CHLORIDE CRYS ER 20 MEQ PO TBCR
40.0000 meq | EXTENDED_RELEASE_TABLET | Freq: Once | ORAL | Status: AC
  Administered 2024-07-27: 40 meq via ORAL
  Filled 2024-07-27: qty 2

## 2024-07-27 NOTE — Telephone Encounter (Signed)
 Patient presents to the [facility] for Velcade  injection and f/u with Dr. Katragadda. Patient received Velcade  injection. Called [facility] @ 15:44 pm pertaining to pick up for patient. RN spoke with [place] at [place]. Per Dena at [facility] call placed to [place] over transportation to confirm a pick up time for the patient. [place] at [phone] called by primary RN B.Emergency planning/management officer. Transportation states he is in [place].   15:53 [facility] here to pick up patient.

## 2024-07-27 NOTE — Progress Notes (Signed)
 Patient has been examined by Dr. Ellin Saba. Vital signs and labs have been reviewed by MD - ANC, Creatinine, LFTs, hemoglobin, and platelets are within treatment parameters per M.D. - pt may proceed with treatment.  Primary RN and pharmacy notified.

## 2024-07-27 NOTE — Patient Instructions (Signed)

## 2024-07-27 NOTE — Progress Notes (Signed)
 Patient tolerated injection with no complaints voiced.  Site clean and dry with no bruising or swelling noted at site.  See MAR for details.  Band aid applied.  Patient stable during and after injection.  Vss with discharge and left in satisfactory condition with no s/s of distress noted.

## 2024-07-27 NOTE — Progress Notes (Signed)
 KCl 40mEq PO x1 ordered per protocol.  Lillie Portner, PharmD, MBA

## 2024-07-27 NOTE — Progress Notes (Signed)
 [facility] [address] [place] [address]   Clinic Day:  07/27/2024  Referring physician: Katrinka Aquas, MD  Patient Care Team: Katrinka Aquas, MD as PCP - General (Internal Medicine) Rogers Hai, MD as Medical Oncologist (Hematology)   ASSESSMENT & PLAN:   Assessment:  1.  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  In the left axilla there is mild tracer avid lymph node measuring 5 mm with SUV 2.3.  In the left breast there is a tracer avid soft tissue nodule within the periareolar region measuring 1.9 cm SUV 6.1.  No tracer avid mediastinal or hilar lymph nodes.  No bone lesions. - Diagnostic mammogram/ultrasound (03/10/2023): Suspicious palpable 2.2 cm mass in the left breast at 9:00 retroareolar.  Suspicious 0.7 cm mass/node in the left axilla. - Left breast 9:00 retroareolar mass biopsy (03/24/2021): IDC, grade 2, ER 95% strong, PR 60% strong, HER2 (0), Ki-67 15% - Left axillary lymph node biopsy: Metastatic carcinoma  2.  Right breast cancer, ER/PR+ G2 HER2 (1+): - PET scan done for multiple myeloma showed incidental breast mass. - Mammogram/ultrasound (03/09/2024): New segmental linear oriented pleomorphic type calcifications spanning 8.6 cm in the outer right breast. - Right breast biopsy outer posterior calcifications (03/16/2024): DCIS, grade 2. - Right breast biopsy outer anterior calcifications (03/16/2024): IDC, grade 2, ER 100% strong, PR 60% strong, HER2 (1+), Ki-67 10% - Ideally we would approach this with lumpectomy/mastectomy and lymph node biopsy bilaterally.  However because of recent worsening of multiple myeloma, definitive breast cancer management was put on back burner.  3.  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  Social/family history: - She lives at home with her sister and sister's husband.  She is independent of ADLs and some IADLs.  Her 2 children are deceased. - Sister had breast and colon cancer.  Mother had cancer, type unknown to the patient.  Brother died of kidney cancer recently.  Another sister had breast cancer.   Plan:  1.  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  She will continue anastrozole  which she is tolerating well.  2.  IgA kappa multiple myeloma, standard risk: - She was hospitalized from 05/27/2024 through 05/30/2024 with close compression fracture of the L2 vertebral body. - She was again hospitalized from 07/14/2024 through 07/16/2024 with Klebsiella UTI. - She is currently at [facility] center. - We reviewed myeloma labs from 06/29/2024: M spike is 0.4 g, down from 0.5 g in March.  Kappa light chains are 112, stable from March and improved from December.  Ratio is 5.74, slightly higher than in March but improved from December. - I have reviewed her MRI of the lumbar spine from 05/27/2024 as well as 07/14/2024.  Wedge compression deformity at L2, L3 and L5.  No evidence of myeloma lesions.  Her previous DEXA scan showed osteopenia. - I have recommended continuing Velcade  every 2 weeks.  Continue Revlimid  10 mg 3 weeks on/1 week off. - Recommend follow-up in 8 weeks with repeat myeloma labs 2 weeks prior.  Will also do PET scan for restaging.  3.  Severe hypokalemia/hypomagnesemia: -  Continue potassium 3 times daily and magnesium  3 times daily.  Potassium is 3.2 and magnesium  is normal.  4.  Osteomyelitis of the right mandible/dental abscess: - Denosumab  on hold indefinitely.  5.  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  Hemoglobin stable at 10.2.   Orders Placed This  Encounter  Procedures   NM PET Image Restage (PS) Skull Base to Thigh (F-18 FDG)    Standing Status:   Future    Expected Date:   09/21/2024    Expiration Date:   07/27/2025    If indicated for the ordered procedure, I authorize the administration of a radiopharmaceutical per Radiology protocol:   Yes    Preferred imaging location?:   [facility]     I,Helena R Teague,acting as a scribe for Alean Stands, MD.,have documented all relevant documentation on the behalf of Alean Stands, MD,as directed by  Alean Stands, MD while in the presence of Alean Stands, MD.  I, Alean Stands MD, have reviewed the above documentation for accuracy and completeness, and I agree with the above.      Alean Stands, MD   7/31/20254:39 PM  CHIEF COMPLAINT/PURPOSE OF CONSULT:   Diagnosis: Bilateral ER positive breast cancer, multiple myeloma   Cancer Staging  Bilateral breast cancer [facility]) Staging form: Breast, AJCC 8th Edition - Clinical stage from 03/30/2024: Stage IIA (cT3, cN0, cM0, G2, ER+, PR+, HER2-) - Unsigned  Breast cancer of upper-inner quadrant of left female breast (HCC) Staging form: Breast, AJCC 8th Edition - Clinical stage from 03/30/2024: Stage IIA (cT2, cN1, cM0, G2, ER+, PR+, HER2-) - Unsigned    Prior Therapy: None  Current Therapy: Anastrozole    HISTORY OF PRESENT ILLNESS:   Oncology History  Multiple myeloma not having achieved remission (HCC)  01/20/2018 Initial Diagnosis   Multiple myeloma not having achieved remission (HCC)   01/26/2018 - 09/01/2022 Chemotherapy   Patient is on Treatment Plan : MYELOMA  RVD SQ (Bortezomib  d 1,8,15 ) q28d x 4 cycles     04/13/2022 -  Chemotherapy   Patient is on Treatment Plan : MYELOMA MAINTENANCE Bortezomib  SQ q14d         Lamiah is a 83 y.o. female presenting to clinic today for evaluation of newly diagnosed bilateral breast cancer at the request of breast center.  She was found to have  incidental breast mass on PET scan for myeloma.  This was followed by diagnostic mammogram and ultrasound on 03/09/2024 and biopsies of both breasts on 03/24/2024.  She is here to discuss results and further plan with her sister.  She attained menarche around age 23.  She had TAH in her 55s.  She had 2 children who are deceased now.  Age at first childbirth was not clear but in her teens.  Never received hormone replacement therapy or birth control pills.  She had previous history of left breast DCIS, underwent lumpectomy in August 2008 and radiation therapy.  Today, she states that she is doing well overall. Her appetite level is at 60%. Her energy level is at 20%.  INTERVAL HISTORY:   CODI FOLKERTS is a 83 y.o. female presenting to the clinic today for follow-up of bilateral breast cancer and multiple myeloma. She was last seen by me on 06/29/2024.  Since her last visit, she was admitted to the hospital from 07/14/2024 to 07/16/2024 for a UTI.   Today, she states that she is doing well overall. Her appetite level is at 75%. Her energy level is  at 75%. Karine is residing at [facility]. She believes she is receiving Revlimid  as prescribed at [facility] and is also taking anastrozole .  She denies any dysuria. Charmagne notes mild back pain.   PAST MEDICAL HISTORY:   Past Medical History: Past Medical History:  Diagnosis Date   Breast cancer (HCC)    left breast/ 2008/ surg/ rad tx   Coronary artery disease    Diabetes mellitus     Surgical History: Past Surgical History:  Procedure Laterality Date   ABDOMINAL HYSTERECTOMY     BREAST BIOPSY Left 03/24/2024   US  LT BREAST BX W LOC DEV 1ST LESION IMG BX SPEC US  GUIDE 03/24/2024 [facility]   BREAST BIOPSY Left 03/24/2024   US  LT BREAST BX W LOC DEV EA ADD LESION IMG BX SPEC US  GUIDE 03/24/2024 [facility]   BREAST BIOPSY Right 03/24/2024   MM RT BREAST BX W LOC DEV EA AD LESION IMG BX SPEC STEREO GUIDE 03/24/2024 [facility]   BREAST BIOPSY Right 03/24/2024   MM RT BREAST BX W LOC DEV 1ST LESION IMAGE BX SPEC STEREO GUIDE 03/24/2024 [facility]   BREAST SURGERY     DEBRIDEMENT MANDIBLE N/A 02/22/2020   Procedure: INCISION AND DRAINAGE WITH DEBRIDEMENT MANDIBLE;  Surgeon: Joanette Soulier, DMD;  Location: [facility];  Service: Oral Surgery;  Laterality: N/A;   DEBRIDEMENT MANDIBLE Right 03/19/2021   Procedure: DEBRIDEMENT OF BONE RIGHT INTERIOR  MANDIBLE;  Surgeon: Joanette Soulier, DMD;  Location: [facility] OR;  Service: Oral Surgery;  Laterality: Right;   EYE SURGERY  2021   cataract removals    TOOTH EXTRACTION N/A 02/22/2020   Procedure: DENTAL RESTORATION/EXTRACTIONS;  Surgeon: Joanette Soulier, DMD;  Location: [facility];  Service: Oral Surgery;  Laterality: N/A;  DENTAL KIT REQUESTED    Social History: Social History   Socioeconomic History   Marital status: Divorced    Spouse name: Not on file   Number of children: Not on file   Years of education: Not on file   Highest education level: Not on file  Occupational History   Not on file  Tobacco Use   Smoking status: Never   Smokeless tobacco: Never  Vaping Use   Vaping status: Never Used  Substance and Sexual Activity   Alcohol use: No   Drug use: No   Sexual activity: Yes    Birth control/protection: Surgical  Other Topics Concern   Not on file  Social History Narrative   Not on file   Social Drivers of Health   Financial Resource Strain: Low Risk  (12/09/2020)   Overall Financial Resource Strain (CARDIA)    Difficulty of Paying Living Expenses: Not hard at all  Food Insecurity: No Food Insecurity (07/14/2024)   Hunger Vital Sign    Worried About Running Out of Food in the Last Year: Never true    Ran Out of Food in the Last Year: Never true  Transportation Needs: No Transportation Needs (07/14/2024)   PRAPARE - Administrator, Civil Service (Medical): No    Lack of Transportation (Non-Medical): No  Physical Activity: Inactive  (12/09/2020)   Exercise Vital Sign    Days of Exercise per Week: 0 days    Minutes of Exercise per Session: 0 min  Stress: No Stress Concern Present (12/09/2020)   [employer] of Occupational Health - Occupational Stress Questionnaire    Feeling of Stress : Not at all  Social Connections: Moderately Isolated (07/15/2024)   Social Connection and  Isolation Panel    Frequency of Communication with Friends and Family: More than three times a week    Frequency of Social Gatherings with Friends and Family: More than three times a week    Attends Religious Services: More than 4 times per year    Active Member of [employer] or Organizations: No    Attends Banker Meetings: Never    Marital Status: Divorced  Catering manager Violence: Not At Risk (07/14/2024)   Humiliation, Afraid, Rape, and Kick questionnaire    Fear of Current or Ex-Partner: No    Emotionally Abused: No    Physically Abused: No    Sexually Abused: No    Family History: Family History  Problem Relation Age of Onset   Obesity Sister    Breast cancer Sister        dx 54s   Breast cancer Sister    Kidney cancer Brother     Current Medications:  Current Outpatient Medications:    acetaminophen  (TYLENOL ) 500 MG tablet, Take 2 tablets (1,000 mg total) by mouth every 8 (eight) hours., Disp: , Rfl:    anastrozole  (ARIMIDEX ) 1 MG tablet, Take 1 tablet (1 mg total) by mouth daily., Disp: 30 tablet, Rfl: 6   ASPIRIN  LOW DOSE 81 MG tablet, Take 81 mg by mouth daily., Disp: , Rfl:    atorvastatin  (LIPITOR) 10 MG tablet, Take 10 mg by mouth daily., Disp: , Rfl:    bortezomib  IV (VELCADE ) 3.5 MG injection, Inject 3.5 mg into the vein once a week. weekly, Disp: , Rfl:    famotidine  (PEPCID ) 20 MG tablet, Take 20 mg by mouth at bedtime as needed for heartburn or indigestion., Disp: , Rfl:    glucose blood (ACCU-CHEK AVIVA PLUS) test strip, CHECK BLOOD SUGAR ONCE DAILY, Disp: , Rfl:    lenalidomide  (REVLIMID ) 10 MG  capsule, Take 1 capsule (10 mg total) by mouth daily. Take daily for 21 days, with 7 days off, Disp: 21 capsule, Rfl: 0   magnesium  oxide (MAG-OX) 400 (240 Mg) MG tablet, TAKE ONE TABLET BY MOUTH THREE TIMES A DAY, Disp: 90 tablet, Rfl: 3   memantine  (NAMENDA ) 5 MG tablet, Take 5 mg by mouth daily., Disp: , Rfl:    metFORMIN  (GLUCOPHAGE ) 500 MG tablet, Take 500 mg by mouth at bedtime., Disp: , Rfl:    pantoprazole  (PROTONIX ) 40 MG tablet, Take 1 tablet (40 mg total) by mouth daily., Disp: , Rfl:    potassium chloride  SA (KLOR-CON  M) 20 MEQ tablet, Take 20 mEq by mouth 2 (two) times daily., Disp: , Rfl:    traMADol  (ULTRAM ) 50 MG tablet, Take 1 tablet (50 mg total) by mouth every 12 (twelve) hours as needed for moderate pain (pain score 4-6) or severe pain (pain score 7-10)., Disp: 6 tablet, Rfl: 0   Allergies: Allergies  Allergen Reactions   Motrin [Ibuprofen] Rash   Seasonal Ic [Cholestatin] Other (See Comments)    Sneezing, watery eyes    REVIEW OF SYSTEMS:   Review of Systems  Constitutional:  Negative for chills, fatigue and fever.  HENT:   Negative for lump/mass, mouth sores, nosebleeds, sore throat and trouble swallowing.   Eyes:  Negative for eye problems.  Respiratory:  Negative for cough and shortness of breath.   Cardiovascular:  Negative for chest pain, leg swelling and palpitations.  Gastrointestinal:  Negative for abdominal pain, constipation, diarrhea, nausea and vomiting.  Genitourinary:  Negative for bladder incontinence, difficulty urinating, dysuria, frequency, hematuria and nocturia.  Musculoskeletal:  Positive for back pain (3/10 severity). Negative for arthralgias, flank pain, myalgias and neck pain.  Skin:  Negative for itching and rash.  Neurological:  Negative for dizziness, headaches and numbness.  Hematological:  Does not bruise/bleed easily.  Psychiatric/Behavioral:  Negative for depression, sleep disturbance and suicidal ideas. The patient is not  nervous/anxious.   All other systems reviewed and are negative.    VITALS:   Blood pressure (!) 163/69, pulse 73, temperature 97.8 F (36.6 C), temperature source Oral, resp. rate 16, SpO2 100%.  Wt Readings from Last 3 Encounters:  07/14/24 140 lb 10.5 oz (63.8 kg)  06/29/24 153 lb 12.8 oz (69.8 kg)  06/29/24 152 lb 14.4 oz (69.4 kg)    There is no height or weight on file to calculate BMI.  Performance status (ECOG): 1 - Symptomatic but completely ambulatory  PHYSICAL EXAM:   Physical Exam Vitals and nursing note reviewed. Exam conducted with a chaperone present.  Constitutional:      Appearance: Normal appearance.  Cardiovascular:     Rate and Rhythm: Normal rate and regular rhythm.     Pulses: Normal pulses.     Heart sounds: Normal heart sounds.  Pulmonary:     Effort: Pulmonary effort is normal.     Breath sounds: Normal breath sounds.  Abdominal:     Palpations: Abdomen is soft. There is no hepatomegaly, splenomegaly or mass.     Tenderness: There is no abdominal tenderness.  Musculoskeletal:     Right lower leg: No edema.     Left lower leg: No edema.  Lymphadenopathy:     Cervical: No cervical adenopathy.     Right cervical: No superficial, deep or posterior cervical adenopathy.    Left cervical: No superficial, deep or posterior cervical adenopathy.     Upper Body:     Right upper body: No supraclavicular or axillary adenopathy.     Left upper body: No supraclavicular or axillary adenopathy.  Neurological:     General: No focal deficit present.     Mental Status: She is alert and oriented to person, place, and time.  Psychiatric:        Mood and Affect: Mood normal.        Behavior: Behavior normal.     LABS:   CBC    Component Value Date/Time   WBC 4.6 07/27/2024 1257   RBC 3.17 (L) 07/27/2024 1257   HGB 10.2 (L) 07/27/2024 1257   HCT 32.3 (L) 07/27/2024 1257   PLT 284 07/27/2024 1257   MCV 101.9 (H) 07/27/2024 1257   MCH 32.2 07/27/2024 1257    MCHC 31.6 07/27/2024 1257   RDW 17.7 (H) 07/27/2024 1257   LYMPHSABS 1.4 07/27/2024 1257   MONOABS 0.3 07/27/2024 1257   EOSABS 0.1 07/27/2024 1257   BASOSABS 0.0 07/27/2024 1257    CMP    Component Value Date/Time   NA 139 07/27/2024 1257   K 3.2 (L) 07/27/2024 1257   CL 107 07/27/2024 1257   CO2 22 07/27/2024 1257   GLUCOSE 166 (H) 07/27/2024 1257   BUN 19 07/27/2024 1257   CREATININE 1.18 (H) 07/27/2024 1257   CALCIUM  9.4 07/27/2024 1257   PROT 7.1 07/27/2024 1257   ALBUMIN 2.8 (L) 07/27/2024 1257   AST 14 (L) 07/27/2024 1257   ALT 9 07/27/2024 1257   ALKPHOS 160 (H) 07/27/2024 1257   BILITOT 0.8 07/27/2024 1257   GFRNONAA 46 (L) 07/27/2024 1257   GFRAA >60 09/30/2020 1225  No results found for: CEA1, CEA / No results found for: CEA1, CEA No results found for: PSA1 No results found for: CAN199 No results found for: CAN125  Lab Results  Component Value Date   TOTALPROTELP 5.8 (L) 06/29/2024   ALBUMINELP 3.0 06/29/2024   A1GS 0.2 06/29/2024   A2GS 0.7 06/29/2024   BETS 1.1 06/29/2024   GAMS 0.9 06/29/2024   MSPIKE 0.4 (H) 06/29/2024   SPEI Comment 06/29/2024   Lab Results  Component Value Date   TIBC 120 (L) 04/20/2024   TIBC 137 (L) 05/20/2023   TIBC 117 (L) 03/24/2023   FERRITIN 306 04/20/2024   FERRITIN 267 05/20/2023   FERRITIN 257 03/24/2023   IRONPCTSAT 23 04/20/2024   IRONPCTSAT 33 (H) 05/20/2023   IRONPCTSAT 44 (H) 03/24/2023   Lab Results  Component Value Date   LDH 137 05/20/2023   LDH 134 03/24/2023   LDH 125 01/26/2023     STUDIES:   MR Lumbar Spine W Wo Contrast Result Date: 07/14/2024 EXAM: MRI LUMBAR SPINE 07/14/2024 05:21:15 PM TECHNIQUE: Multiplanar multisequence MRI of the lumbar spine was performed with and without the administration of intravenous contrast. 6mL Gadobutrol  (GADAVIST ) 1 MMOL/ML IV solution was used. COMPARISON: 05/27/2024 CLINICAL HISTORY: Compression fracture, lumbar. F/u New inferior endplate  deformity of L3 and superior endplate deformity of L5 seen on CT scan today. FINDINGS: BONES AND ALIGNMENT: Mild compression deformity at L2 is unchanged. There is new bone marrow edema and height loss at the inferior endplate of L3 and the superior endplate of L5 with less than 25% height loss. Grade 1 anterolisthesis at L4-5 and L5-S1. SPINAL CORD: The conus terminates normally. SOFT TISSUES: No paraspinal mass. L1-L2: No significant disc herniation. No spinal canal stenosis or neural foraminal narrowing. L2-L3: Small disc bulge without stenosis. L3-L4: Small disc bulge with mild bilateral foraminal narrowing. Mild left facet hypertrophy. L4-L5: Moderate facet hypertrophy with moderate spinal canal stenosis and mild bilateral foraminal stenosis. L5-S1: Moderate facet hypertrophy. Severe bilateral foraminal stenosis. Right lateral recess narrowing. IMPRESSION: 1. New wedge compression fractures at L3 and L5 with less than 25% height loss. 2. Mild compression deformity at L2, unchanged. 3. Grade 1 anterolisthesis at L4-5 and L5-S1. 4. 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 5. Severe bilateral foraminal stenosis at L5-S1. Electronically signed by: Franky Stanford MD 07/14/2024 07:15 PM EDT RP Workstation: [id number]   CT ABDOMEN PELVIS W CONTRAST Result Date: 07/14/2024 CLINICAL DATA:  Abdominal pain, discharge, UTI and back pain for 2 months, history of multiple myeloma * Tracking Code: BO * EXAM: CT ABDOMEN AND PELVIS WITH CONTRAST TECHNIQUE: Multidetector CT imaging of the abdomen and pelvis was performed using the standard protocol following bolus administration of intravenous contrast. RADIATION DOSE REDUCTION: This exam was performed according to the departmental dose-optimization program which includes automated exposure control, adjustment of the mA and/or kV according to patient size and/or use of iterative reconstruction technique. CONTRAST:  OMNIPAQUE  IOHEXOL  300 MG/ML  SOLN COMPARISON:  05/27/2024  FINDINGS: Lower chest: No acute abnormality.  Coronary artery calcifications. Hepatobiliary: No solid liver abnormality is seen. No gallstones, gallbladder wall thickening, or biliary dilatation. Faintly calcified gallstones seen on prior examination are not clearly appreciated today. Pancreas: Unremarkable. No pancreatic ductal dilatation or surrounding inflammatory changes. Spleen: Normal in size without significant abnormality. Adrenals/Urinary Tract: Adrenal glands are unremarkable. Simple, benign left renal cortical cysts for which no further follow-up or characterization is required. Kidneys are otherwise normal, without renal calculi, solid lesion, or hydronephrosis. Bladder  is unremarkable. Stomach/Bowel: Stomach is within normal limits. Appendix not clearly visualized. No evidence of bowel wall thickening, distention, or inflammatory changes. Descending and sigmoid diverticulosis. Vascular/Lymphatic: Aortic atherosclerosis. No enlarged abdominal or pelvic lymph nodes. Reproductive: Hysterectomy. Other: No abdominal wall hernia or abnormality. No ascites. Musculoskeletal: No acute or significant osseous findings. Unchanged superior and inferior endplate deformities of L2. New inferior endplate deformity of L3 and superior endplate deformity of L5. IMPRESSION: 1. No acute CT findings of the abdominal or pelvic organs to explain abdominal pain, discharge, or hematuria. 2. Descending and sigmoid diverticulosis without evidence of acute diverticulitis. 3. Unchanged superior and inferior endplate deformities of L2. New inferior endplate deformity of L3 and superior endplate deformity of L5. Correlate for acutely referable pain and point tenderness. Consider MRI to assess for fracture acuity and underlying lesion in this patient with history of multiple myeloma. 4. Coronary artery disease. Aortic Atherosclerosis (ICD10-I70.0). Electronically Signed   By: Marolyn JONETTA Jaksch M.D.   On: 07/14/2024 12:14

## 2024-07-27 NOTE — Patient Instructions (Signed)
 [facility] [place] [facility]  Discharge Instructions: Thank you for choosing [facility] to provide your oncology and hematology care.  If you have a lab appointment with the Cancer Center - please note that after April 8th, 2024, all labs will be drawn in the cancer center.  You do not have to check in or register with the main entrance as you have in the past but will complete your check-in in the cancer center.  Wear comfortable clothing and clothing appropriate for easy access to any Portacath or PICC line.   We strive to give you quality time with your provider. You may need to reschedule your appointment if you arrive late (15 or more minutes).  Arriving late affects you and other patients whose appointments are after yours.  Also, if you miss three or more appointments without notifying the office, you may be dismissed from the clinic at the provider's discretion.      For prescription refill requests, have your pharmacy contact our office and allow 72 hours for refills to be completed.    Today you received the following chemotherapy and/or immunotherapy agents velcade     To help prevent nausea and vomiting after your treatment, we encourage you to take your nausea medication as directed.  BELOW ARE SYMPTOMS THAT SHOULD BE REPORTED IMMEDIATELY: *FEVER GREATER THAN 100.4 F (38 C) OR HIGHER *CHILLS OR SWEATING *NAUSEA AND VOMITING THAT IS NOT CONTROLLED WITH YOUR NAUSEA MEDICATION *UNUSUAL SHORTNESS OF BREATH *UNUSUAL BRUISING OR BLEEDING *URINARY PROBLEMS (pain or burning when urinating, or frequent urination) *BOWEL PROBLEMS (unusual diarrhea, constipation, pain near the anus) TENDERNESS IN MOUTH AND THROAT WITH OR WITHOUT PRESENCE OF ULCERS (sore throat, sores in mouth, or a toothache) UNUSUAL RASH, SWELLING OR PAIN  UNUSUAL VAGINAL DISCHARGE OR ITCHING   Items with * indicate a potential emergency and should be followed up as  soon as possible or go to the Emergency Department if any problems should occur.  Please show the CHEMOTHERAPY ALERT CARD or IMMUNOTHERAPY ALERT CARD at check-in to the Emergency Department and triage nurse.  Should you have questions after your visit or need to cancel or reschedule your appointment, please contact [facility] [place] - A DEPT OF [facility] [phone]  and follow the prompts.  Office hours are 8:00 a.m. to 4:30 p.m. Monday - Friday. Please note that voicemails left after 4:00 p.m. may not be returned until the following business day.  We are closed weekends and major holidays. You have access to a nurse at all times for urgent questions. Please call the main number to the clinic [phone] and follow the prompts.  For any non-urgent questions, you may also contact your provider using MyChart. We now offer e-Visits for anyone 62 and older to request care online for non-urgent symptoms. For details visit [email or web address].   Also download the MyChart app! Go to the app store, search "MyChart", open the app, select [place], and log in with your MyChart username and password.

## 2024-07-28 ENCOUNTER — Other Ambulatory Visit: Payer: Self-pay

## 2024-07-28 DIAGNOSIS — C9000 Multiple myeloma not having achieved remission: Secondary | ICD-10-CM

## 2024-07-28 MED ORDER — LENALIDOMIDE 10 MG PO CAPS: 10.0000 mg | ORAL_CAPSULE | Freq: Every day | ORAL | 0 refills | Status: DC

## 2024-08-01 ENCOUNTER — Other Ambulatory Visit: Payer: Self-pay | Admitting: *Deleted

## 2024-08-01 DIAGNOSIS — C9000 Multiple myeloma not having achieved remission: Secondary | ICD-10-CM

## 2024-08-01 MED ORDER — LENALIDOMIDE 10 MG PO CAPS
10.0000 mg | ORAL_CAPSULE | Freq: Every day | ORAL | 0 refills | Status: DC
Start: 2024-08-01 — End: 2024-08-25

## 2024-08-04 ENCOUNTER — Other Ambulatory Visit: Payer: Self-pay

## 2024-08-10 ENCOUNTER — Inpatient Hospital Stay

## 2024-08-11 ENCOUNTER — Other Ambulatory Visit: Payer: Self-pay

## 2024-08-11 ENCOUNTER — Emergency Department (HOSPITAL_COMMUNITY)

## 2024-08-11 ENCOUNTER — Observation Stay (HOSPITAL_COMMUNITY)
Admission: EM | Admit: 2024-08-11 | Discharge: 2024-08-16 | Disposition: A | Discharge status: Home / Self Care | Attending: Internal Medicine | Admitting: Internal Medicine

## 2024-08-11 ENCOUNTER — Encounter (HOSPITAL_COMMUNITY): Payer: Self-pay

## 2024-08-11 DIAGNOSIS — R52 Pain, unspecified: Secondary | ICD-10-CM | POA: Diagnosis not present

## 2024-08-11 DIAGNOSIS — I2510 Atherosclerotic heart disease of native coronary artery without angina pectoris: Secondary | ICD-10-CM | POA: Diagnosis not present

## 2024-08-11 DIAGNOSIS — S32000A Wedge compression fracture of unspecified lumbar vertebra, initial encounter for closed fracture: Principal | ICD-10-CM

## 2024-08-11 DIAGNOSIS — I700 Atherosclerosis of aorta: Secondary | ICD-10-CM | POA: Diagnosis not present

## 2024-08-11 DIAGNOSIS — M5489 Other dorsalgia: Secondary | ICD-10-CM | POA: Diagnosis present

## 2024-08-11 DIAGNOSIS — E119 Type 2 diabetes mellitus without complications: Secondary | ICD-10-CM

## 2024-08-11 DIAGNOSIS — Z79899 Other long term (current) drug therapy: Secondary | ICD-10-CM | POA: Insufficient documentation

## 2024-08-11 DIAGNOSIS — E785 Hyperlipidemia, unspecified: Secondary | ICD-10-CM | POA: Diagnosis not present

## 2024-08-11 DIAGNOSIS — C9000 Multiple myeloma not having achieved remission: Secondary | ICD-10-CM | POA: Diagnosis present

## 2024-08-11 DIAGNOSIS — N1832 Chronic kidney disease, stage 3b: Secondary | ICD-10-CM | POA: Diagnosis not present

## 2024-08-11 DIAGNOSIS — C50212 Malignant neoplasm of upper-inner quadrant of left female breast: Secondary | ICD-10-CM | POA: Diagnosis not present

## 2024-08-11 DIAGNOSIS — F0390 Unspecified dementia without behavioral disturbance: Secondary | ICD-10-CM | POA: Diagnosis present

## 2024-08-11 DIAGNOSIS — E1122 Type 2 diabetes mellitus with diabetic chronic kidney disease: Secondary | ICD-10-CM | POA: Diagnosis not present

## 2024-08-11 DIAGNOSIS — E11628 Type 2 diabetes mellitus with other skin complications: Secondary | ICD-10-CM | POA: Diagnosis not present

## 2024-08-11 DIAGNOSIS — N183 Chronic kidney disease, stage 3 unspecified: Secondary | ICD-10-CM | POA: Diagnosis present

## 2024-08-11 DIAGNOSIS — Z7901 Long term (current) use of anticoagulants: Secondary | ICD-10-CM | POA: Insufficient documentation

## 2024-08-11 DIAGNOSIS — E876 Hypokalemia: Secondary | ICD-10-CM | POA: Diagnosis not present

## 2024-08-11 DIAGNOSIS — K219 Gastro-esophageal reflux disease without esophagitis: Secondary | ICD-10-CM | POA: Diagnosis not present

## 2024-08-11 DIAGNOSIS — Z170 Estrogen receptor positive status [ER+]: Secondary | ICD-10-CM

## 2024-08-11 LAB — CBC WITH DIFFERENTIAL/PLATELET
Abs Immature Granulocytes: 0.02 K/uL (ref 0.00–0.07)
Basophils Absolute: 0 K/uL (ref 0.0–0.1)
Basophils Relative: 1 %
Eosinophils Absolute: 0.1 K/uL (ref 0.0–0.5)
Eosinophils Relative: 2 %
HCT: 28.2 % — ABNORMAL LOW (ref 36.0–46.0)
Hemoglobin: 9 g/dL — ABNORMAL LOW (ref 12.0–15.0)
Immature Granulocytes: 1 %
Lymphocytes Relative: 20 %
Lymphs Abs: 0.8 K/uL (ref 0.7–4.0)
MCH: 32 pg (ref 26.0–34.0)
MCHC: 31.9 g/dL (ref 30.0–36.0)
MCV: 100.4 fL — ABNORMAL HIGH (ref 80.0–100.0)
Monocytes Absolute: 0.2 K/uL (ref 0.1–1.0)
Monocytes Relative: 5 %
Neutro Abs: 2.8 K/uL (ref 1.7–7.7)
Neutrophils Relative %: 71 %
Platelets: 174 K/uL (ref 150–400)
RBC: 2.81 MIL/uL — ABNORMAL LOW (ref 3.87–5.11)
RDW: 18.4 % — ABNORMAL HIGH (ref 11.5–15.5)
WBC: 3.9 K/uL — ABNORMAL LOW (ref 4.0–10.5)
nRBC: 0 % (ref 0.0–0.2)

## 2024-08-11 LAB — URINALYSIS, W/ REFLEX TO CULTURE (INFECTION SUSPECTED)
Bacteria, UA: NONE SEEN
Bilirubin Urine: NEGATIVE
Glucose, UA: NEGATIVE mg/dL
Ketones, ur: NEGATIVE mg/dL
Leukocytes,Ua: NEGATIVE
Nitrite: NEGATIVE
Protein, ur: NEGATIVE mg/dL
Specific Gravity, Urine: 1.009 (ref 1.005–1.030)
pH: 5 (ref 5.0–8.0)

## 2024-08-11 LAB — BASIC METABOLIC PANEL WITH GFR
Anion gap: 10 (ref 5–15)
BUN: 14 mg/dL (ref 8–23)
CO2: 22 mmol/L (ref 22–32)
Calcium: 8.7 mg/dL — ABNORMAL LOW (ref 8.9–10.3)
Chloride: 106 mmol/L (ref 98–111)
Creatinine, Ser: 0.82 mg/dL (ref 0.44–1.00)
GFR, Estimated: >60 mL/min (ref >60)
Glucose, Bld: 88 mg/dL (ref 70–99)
Potassium: 3.6 mmol/L (ref 3.5–5.1)
Sodium: 138 mmol/L (ref 135–145)

## 2024-08-11 LAB — MAGNESIUM: Magnesium: 1.6 mg/dL — ABNORMAL LOW (ref 1.7–2.4)

## 2024-08-11 MED ORDER — BORTEZOMIB CHEMO IV INJECTION 3.5 MG: 3.5000 mg | INTRAMUSCULAR | Status: DC

## 2024-08-11 MED ORDER — POTASSIUM CHLORIDE 20 MEQ PO PACK
20.0000 meq | PACK | Freq: Two times a day (BID) | ORAL | Status: DC
  Administered 2024-08-11 – 2024-08-14 (×6): 20 meq via ORAL
  Filled 2024-08-11 (×6): qty 1

## 2024-08-11 MED ORDER — MEMANTINE HCL 10 MG PO TABS
5.0000 mg | ORAL_TABLET | Freq: Every day | ORAL | Status: DC
  Administered 2024-08-11 – 2024-08-15 (×5): 5 mg via ORAL
  Filled 2024-08-11 (×6): qty 1

## 2024-08-11 MED ORDER — PANTOPRAZOLE SODIUM 40 MG PO TBEC
40.0000 mg | DELAYED_RELEASE_TABLET | Freq: Every day | ORAL | Status: DC
  Administered 2024-08-11 – 2024-08-15 (×5): 40 mg via ORAL
  Filled 2024-08-11 (×6): qty 1

## 2024-08-11 MED ORDER — HEPARIN SODIUM (PORCINE) 5000 UNIT/ML IJ SOLN
5000.0000 [IU] | Freq: Three times a day (TID) | INTRAMUSCULAR | Status: DC
  Administered 2024-08-11 – 2024-08-16 (×14): 5000 [IU] via SUBCUTANEOUS
  Filled 2024-08-11 (×13): qty 1

## 2024-08-11 MED ORDER — MIRTAZAPINE 15 MG PO TABS
7.5000 mg | ORAL_TABLET | Freq: Every day | ORAL | Status: DC
  Administered 2024-08-11 – 2024-08-15 (×5): 7.5 mg via ORAL
  Filled 2024-08-11 (×5): qty 1

## 2024-08-11 MED ORDER — FENTANYL CITRATE (PF) 100 MCG/2ML IJ SOLN
50.0000 ug | Freq: Once | INTRAMUSCULAR | Status: AC
  Administered 2024-08-11: 50 ug via INTRAVENOUS
  Filled 2024-08-11: qty 2

## 2024-08-11 MED ORDER — INSULIN ASPART 100 UNIT/ML IJ SOLN: 0.0000 [IU] | Freq: Every day | INTRAMUSCULAR | Status: DC

## 2024-08-11 MED ORDER — INSULIN ASPART 100 UNIT/ML IJ SOLN
0.0000 [IU] | Freq: Three times a day (TID) | INTRAMUSCULAR | Status: DC
  Administered 2024-08-12 – 2024-08-15 (×4): 2 [IU] via SUBCUTANEOUS

## 2024-08-11 MED ORDER — ASPIRIN 81 MG PO TBEC
81.0000 mg | DELAYED_RELEASE_TABLET | Freq: Every day | ORAL | Status: DC
  Administered 2024-08-11 – 2024-08-15 (×5): 81 mg via ORAL
  Filled 2024-08-11 (×6): qty 1

## 2024-08-11 MED ORDER — LENALIDOMIDE 10 MG PO CAPS: 10.0000 mg | ORAL_CAPSULE | Freq: Every day | ORAL | Status: DC

## 2024-08-11 MED ORDER — ANASTROZOLE 1 MG PO TABS
1.0000 mg | ORAL_TABLET | Freq: Every day | ORAL | Status: DC
  Administered 2024-08-11 – 2024-08-15 (×5): 1 mg via ORAL
  Filled 2024-08-11 (×7): qty 1

## 2024-08-11 MED ORDER — SENNA 8.6 MG PO TABS
1.0000 | ORAL_TABLET | Freq: Two times a day (BID) | ORAL | Status: DC
  Administered 2024-08-11 – 2024-08-15 (×9): 8.6 mg via ORAL
  Filled 2024-08-11 (×10): qty 1

## 2024-08-11 MED ORDER — MAGNESIUM OXIDE -MG SUPPLEMENT 400 (240 MG) MG PO TABS
400.0000 mg | ORAL_TABLET | Freq: Three times a day (TID) | ORAL | Status: DC
  Administered 2024-08-11: 400 mg via ORAL
  Filled 2024-08-11: qty 1

## 2024-08-11 MED ORDER — ATORVASTATIN CALCIUM 10 MG PO TABS
10.0000 mg | ORAL_TABLET | Freq: Every day | ORAL | Status: DC
  Administered 2024-08-11 – 2024-08-15 (×5): 10 mg via ORAL
  Filled 2024-08-11 (×6): qty 1

## 2024-08-11 MED ORDER — HYDROMORPHONE HCL 1 MG/ML IJ SOLN: 0.5000 mg | INTRAMUSCULAR | Status: DC | PRN

## 2024-08-11 NOTE — ED Notes (Signed)
 Pt states she is unable to provide urine at this time. Will continue to encourage her.

## 2024-08-11 NOTE — ED Triage Notes (Signed)
 Pt BIB ems for back pain per pt rt side. Pt has hx of lukemia, compression fx and has a back brace at this time. Pt is from home. Pt states back pain not that bad.

## 2024-08-11 NOTE — ED Notes (Signed)
 Pt brief and sheets soaked. Cleaned and changed and fresh brief provided.

## 2024-08-11 NOTE — ED Provider Notes (Signed)
 [place] [facility] AT [facility] Provider Note   CSN: [id number] Arrival date & time: 08/11/24  1201     Patient presents with: Back Pain   Brittany Archer is a 83 y.o. female.  She has a history of multiple myeloma and CKD.  She has a known compression fracture and is wearing a back brace for the last few months.  Complaining of worsening of her chronic right lower back pain today.  Tylenol  not helping.  This has been an on-and-off problem per her daughter.  No fevers chills nausea vomiting or urinary symptoms.  No recent falls.   The history is provided by the patient.  Back Pain Location:  Lumbar spine Quality:  Aching Onset quality:  Unable to specify Timing:  Unable to specify Progression:  Unchanged Chronicity:  Recurrent Relieved by:  Nothing Ineffective treatments:  OTC medications Associated symptoms: no abdominal pain, no chest pain, no dysuria, no fever and no weakness        Prior to Admission medications   Medication Sig Start Date End Date Taking? Authorizing Provider  acetaminophen  (TYLENOL ) 500 MG tablet Take 2 tablets (1,000 mg total) by mouth every 8 (eight) hours. 05/29/24   Evonnie Lenis, MD  anastrozole  (ARIMIDEX ) 1 MG tablet Take 1 tablet (1 mg total) by mouth daily. 03/30/24   Rogers Hai, MD  ASPIRIN  LOW DOSE 81 MG tablet Take 81 mg by mouth daily. 07/11/24   [provider]  atorvastatin  (LIPITOR) 10 MG tablet Take 10 mg by mouth daily. 08/10/23   [provider]  bortezomib  IV (VELCADE ) 3.5 MG injection Inject 3.5 mg into the vein once a week. weekly    [provider]  famotidine  (PEPCID ) 20 MG tablet Take 20 mg by mouth at bedtime as needed for heartburn or indigestion. 03/16/24   [provider]  glucose blood (ACCU-CHEK AVIVA PLUS) test strip CHECK BLOOD SUGAR ONCE DAILY    [provider]  lenalidomide  (REVLIMID ) 10 MG capsule Take 1 capsule (10 mg total) by mouth daily. Take  daily for 21 days, with 7 days off 08/01/24   Rogers Hai, MD  magnesium  oxide (MAG-OX) 400 (240 Mg) MG tablet TAKE ONE TABLET BY MOUTH THREE TIMES A DAY 05/01/24   Rogers Hai, MD  memantine  (NAMENDA ) 5 MG tablet Take 5 mg by mouth daily.    [provider]  metFORMIN  (GLUCOPHAGE ) 500 MG tablet Take 500 mg by mouth at bedtime. 07/11/24   [provider]  pantoprazole  (PROTONIX ) 40 MG tablet Take 1 tablet (40 mg total) by mouth daily. 05/30/24   Evonnie Lenis, MD  potassium chloride  SA (KLOR-CON  M) 20 MEQ tablet Take 20 mEq by mouth 2 (two) times daily. 03/18/24   [provider]  traMADol  (ULTRAM ) 50 MG tablet Take 1 tablet (50 mg total) by mouth every 12 (twelve) hours as needed for moderate pain (pain score 4-6) or severe pain (pain score 7-10). 07/16/24   Bryn Bernardino NOVAK, MD    Allergies: Motrin [ibuprofen] and Seasonal ic [cholestatin]    Review of Systems  Constitutional:  Negative for fever.  Cardiovascular:  Negative for chest pain.  Gastrointestinal:  Negative for abdominal pain.  Genitourinary:  Negative for dysuria.  Musculoskeletal:  Positive for back pain.  Neurological:  Negative for weakness.    Updated Vital Signs BP (!) 142/69 (BP Location: Left Arm)   Pulse 61   Temp 99.5 F (37.5 C) (Oral)   Resp 16  Ht 5' 4 (1.626 m)   Wt 63.8 kg   SpO2 99%   BMI 24.14 kg/m   Physical Exam Vitals and nursing note reviewed.  Constitutional:      General: She is not in acute distress.    Appearance: Normal appearance. She is well-developed.  HENT:     Head: Normocephalic and atraumatic.  Eyes:     Conjunctiva/sclera: Conjunctivae normal.  Cardiovascular:     Rate and Rhythm: Normal rate and regular rhythm.     Heart sounds: No murmur heard. Pulmonary:     Effort: Pulmonary effort is normal. No respiratory distress.     Breath sounds: Normal breath sounds. No stridor. No wheezing.  Abdominal:     Palpations: Abdomen is soft.      Tenderness: There is no abdominal tenderness. There is no guarding or rebound.  Musculoskeletal:        General: Tenderness (right paralumbar) present.     Cervical back: Neck supple.     Comments: She is in a TLSO brace.  Skin:    General: Skin is warm and dry.  Neurological:     General: No focal deficit present.     Mental Status: She is alert.     GCS: GCS eye subscore is 4. GCS verbal subscore is 5. GCS motor subscore is 6.     Motor: No weakness.     (all labs ordered are listed, but only abnormal results are displayed) Labs Reviewed  BASIC METABOLIC PANEL WITH GFR - Abnormal; Notable for the following components:      Result Value   Calcium  8.7 (*)    All other components within normal limits  CBC WITH DIFFERENTIAL/PLATELET - Abnormal; Notable for the following components:   WBC 3.9 (*)    RBC 2.81 (*)    Hemoglobin 9.0 (*)    HCT 28.2 (*)    MCV 100.4 (*)    RDW 18.4 (*)    All other components within normal limits  MAGNESIUM  - Abnormal; Notable for the following components:   Magnesium  1.6 (*)    All other components within normal limits  URINALYSIS, W/ REFLEX TO CULTURE (INFECTION SUSPECTED)    EKG: None  Radiology: CT L-SPINE NO CHARGE Result Date: 08/11/2024 CLINICAL DATA:  back pain per pt rt side. Pt has hx of leukemia, compression fx and has a back brace at this time. Pt is from home. Pt states back pain not that bad. EXAM: CT LUMBAR SPINE WITHOUT CONTRAST TECHNIQUE: Multidetector CT imaging of the lumbar spine was performed without intravenous contrast administration. Multiplanar CT image reconstructions were also generated. RADIATION DOSE REDUCTION: This exam was performed according to the departmental dose-optimization program which includes automated exposure control, adjustment of the mA and/or kV according to patient size and/or use of iterative reconstruction technique. COMPARISON:  CT lumbar spine 05/27/2024, MRI lumbar spine 07/14/2024 FINDINGS:  Segmentation: 5 lumbar type vertebrae. Alignment: Normal. Vertebrae: Interval development of age-indeterminate L1 superior endplate compression fracture with at least 20% vertebral body height loss. Stable to slightly worsened L5 compression fracture with at least 35% vertebral body height loss. Chronic stable L2 compression fracture with similar-appearing 70% vertebral body height loss centrally. Chronic stable L3 compression fracture with at least 35% vertebral body height loss. No focal pathologic process. Paraspinal and other soft tissues: Negative. Disc levels: Maintained. IMPRESSION: 1. Interval development of age-indeterminate L1 superior endplate compression fracture with at least 20% vertebral body height loss. Correlate with point tenderness to palpation  to evaluate for an acute component. 2. Stable to slightly worsened L5 compression fracture with at least 35% vertebral body height loss. Correlate with point tenderness to palpation to evaluate for an acute component. 3. Stable L2 and L3 compression fractures. Electronically Signed   By: Morgane  Naveau M.D.   On: 08/11/2024 17:04   CT Renal Stone Study Result Date: 08/11/2024 CLINICAL DATA:  Abdominal/flank pain, stone suspected Pt has hx of leukemia, compression fx and has a back brace at this time. Pt is from home. Pt states back pain not that bad. EXAM: CT ABDOMEN AND PELVIS WITHOUT CONTRAST TECHNIQUE: Multidetector CT imaging of the abdomen and pelvis was performed following the standard protocol without IV contrast. RADIATION DOSE REDUCTION: This exam was performed according to the departmental dose-optimization program which includes automated exposure control, adjustment of the mA and/or kV according to patient size and/or use of iterative reconstruction technique. COMPARISON:  None Available. FINDINGS: Lower chest: Left lower lobe dependent atelectasis. Small hiatal hernia. Hepatobiliary: No focal liver abnormality. No gallstones, gallbladder  wall thickening, or pericholecystic fluid. No biliary dilatation. Pancreas: No focal lesion. Normal pancreatic contour. No surrounding inflammatory changes. No main pancreatic ductal dilatation. Spleen: Normal in size without focal abnormality. Adrenals/Urinary Tract: No adrenal nodule bilaterally. No nephrolithiasis and no hydronephrosis. Fluid dense lesion of the left kidney likely represents a simple renal cyst. Simple renal cysts, in the absence of clinically indicated signs/symptoms, require no independent follow-up. No ureterolithiasis or hydroureter. The urinary bladder is unremarkable. Stomach/Bowel: Stomach is within normal limits. No evidence of bowel wall thickening or dilatation. Colonic diverticulosis. The appendix is not definitely identified with no inflammatory changes in the right lower quadrant to suggest acute appendicitis. Vascular/Lymphatic: No abdominal aorta or iliac aneurysm. Severe atherosclerotic plaque of the aorta and its branches. No abdominal, pelvic, or inguinal lymphadenopathy. Reproductive: Status post hysterectomy. No adnexal masses. Other: No intraperitoneal free fluid. No intraperitoneal free gas. No organized fluid collection. Musculoskeletal: No abdominal wall hernia or abnormality. No suspicious lytic or blastic osseous lesions. No acute displaced fracture. Please see separately dictated CT lumbar spine 08/11/2024. IMPRESSION: 1. No acute intra-abdominal or intrapelvic abnormality with limited evaluation on this noncontrast study. 2. Small hiatal hernia. 3. Colonic diverticulosis with no acute diverticulitis. 4.  Aortic Atherosclerosis (ICD10-I70.0). 5. Please see separately dictated CT lumbar spine 08/11/2024. Electronically Signed   By: Morgane  Naveau M.D.   On: 08/11/2024 16:58     Procedures   Medications Ordered in the ED - No data to display  Clinical Course as of 08/11/24 1729  Fri Aug 11, 2024  1415 On review of prior medical records she was admitted in May  for similar right back pain.  Found to have an L2 compression fracture and a urinary tract infection.  Neurosurgery recommended TLSO brace. [MB]    Clinical Course User Index [MB] Towana Ozell BROCKS, MD                                 Medical Decision Making Amount and/or Complexity of Data Reviewed Labs: ordered. Radiology: ordered.   This patient complains of worsening of her chronic low back pain; this involves an extensive number of treatment Options and is a complaint that carries with it a high risk of complications and morbidity. The differential includes fracture, pyelonephritis, mass  I ordered, reviewed and interpreted labs, which included CBC with chronically low white count and hemoglobin, chemistries unremarkable,  magnesium  low I ordered imaging studies which included CT renal, CT lumbar spine and these are pending at time of signout Additional history obtained from patient's family member Previous records obtained and reviewed in epic including recent ED visits Social determinants considered, no significant barriers Critical Interventions: None  After the interventions stated above, I reevaluated the patient and found patient to be comfortable appearing in no distress Admission and further testing considered, her care was signed out Dr. Patsey to follow-up on urinalysis and results of imaging.  If no significant acute findings patient can probably be discharged with adjustment of her pain regiment.      Final diagnoses:  None    ED Discharge Orders     None          Towana Ozell BROCKS, MD 08/11/24 1731

## 2024-08-11 NOTE — H&P (Addendum)
 PCP:   Katrinka Aquas, MD   Chief Complaint:  Intractable back pain  HPI:  This is a 83 year old female with past medical history significant for dementia, T2DM, CAD, multiple myeloma, bilateral breast cancer, CKD stage IIIb, GERD.  Patient was recently admitted 7/18 to 7/20 with complaints of back pain secondary to close compression fracture L2.  She was provided a TLSO brace, pain control to, and discharged.  Last night she went to bed, could not get up this morning secondary to increased back pain.  Visiting home nursing came by.  Patient sent to the ER.  Patient is incontinent of bowel and bladder but this is not new.    CT L-spine IMPRESSION: 1. Interval development of age-indeterminate L1 superior endplate compression fracture with at least 20% vertebral body height loss. Correlate with point tenderness to palpation to evaluate for an acute component. 2. Stable to slightly worsened L5 compression fracture with at least 35% vertebral body height loss. Correlate with point tenderness to palpation to evaluate for an acute component. 3. Stable L2 and L3 compression fractures.  Review of Systems:  The patient denies anorexia, fever, weight loss,, vision loss, decreased hearing, hoarseness, chest pain, syncope, dyspnea on exertion, peripheral edema, balance deficits, hemoptysis, abdominal pain, melena, hematochezia, severe indigestion/heartburn, hematuria, incontinence, genital sores, muscle weakness, suspicious skin lesions, transient blindness, depression, unusual weight change, abnormal bleeding, enlarged lymph nodes, angioedema, and breast masses. Positive: Back pain  Past Medical History: Past Medical History:  Diagnosis Date   Breast cancer (HCC)    left breast/ 2008/ surg/ rad tx   Coronary artery disease    Diabetes mellitus    Past Surgical History:  Procedure Laterality Date   ABDOMINAL HYSTERECTOMY     BREAST BIOPSY Left 03/24/2024   US  LT BREAST BX W LOC DEV 1ST LESION  IMG BX SPEC US  GUIDE 03/24/2024 [facility]   BREAST BIOPSY Left 03/24/2024   US  LT BREAST BX W LOC DEV EA ADD LESION IMG BX SPEC US  GUIDE 03/24/2024 [facility]   BREAST BIOPSY Right 03/24/2024   MM RT BREAST BX W LOC DEV EA AD LESION IMG BX SPEC STEREO GUIDE 03/24/2024 [facility]   BREAST BIOPSY Right 03/24/2024   MM RT BREAST BX W LOC DEV 1ST LESION IMAGE BX SPEC STEREO GUIDE 03/24/2024 [facility]   BREAST SURGERY     DEBRIDEMENT MANDIBLE N/A 02/22/2020   Procedure: INCISION AND DRAINAGE WITH DEBRIDEMENT MANDIBLE;  Surgeon: Joanette Soulier, DMD;  Location: [facility];  Service: Oral Surgery;  Laterality: N/A;   DEBRIDEMENT MANDIBLE Right 03/19/2021   Procedure: DEBRIDEMENT OF BONE RIGHT INTERIOR  MANDIBLE;  Surgeon: Joanette Soulier, DMD;  Location: [facility];  Service: Oral Surgery;  Laterality: Right;   EYE SURGERY  2021   cataract removals    TOOTH EXTRACTION N/A 02/22/2020   Procedure: DENTAL RESTORATION/EXTRACTIONS;  Surgeon: Joanette Soulier, DMD;  Location: [facility];  Service: Oral Surgery;  Laterality: N/A;  DENTAL KIT REQUESTED    Medications: Prior to Admission medications   Medication Sig Start Date End Date Taking? Authorizing Provider  acetaminophen  (TYLENOL ) 500 MG tablet Take 2 tablets (1,000 mg total) by mouth every 8 (eight) hours. 05/29/24   Evonnie Lenis, MD  anastrozole  (ARIMIDEX ) 1 MG tablet Take 1 tablet (1 mg total) by mouth daily. 03/30/24   Rogers Hai, MD  ASPIRIN  LOW DOSE 81 MG tablet Take 81 mg by mouth daily. 07/11/24   [provider]  atorvastatin  (LIPITOR) 10 MG tablet Take  10 mg by mouth daily. 08/10/23   [provider]  bortezomib  IV (VELCADE ) 3.5 MG injection Inject 3.5 mg into the vein once a week. weekly    [provider]  famotidine  (PEPCID ) 20 MG tablet Take 20 mg by mouth at bedtime as needed for heartburn or indigestion. 03/16/24   [provider]  glucose blood (ACCU-CHEK AVIVA PLUS) test strip CHECK BLOOD  SUGAR ONCE DAILY    [provider]  lenalidomide  (REVLIMID ) 10 MG capsule Take 1 capsule (10 mg total) by mouth daily. Take daily for 21 days, with 7 days off 08/01/24   Rogers Hai, MD  magnesium  oxide (MAG-OX) 400 (240 Mg) MG tablet TAKE ONE TABLET BY MOUTH THREE TIMES A DAY 05/01/24   Rogers Hai, MD  memantine  (NAMENDA ) 5 MG tablet Take 5 mg by mouth daily.    [provider]  metFORMIN  (GLUCOPHAGE ) 500 MG tablet Take 500 mg by mouth at bedtime. 07/11/24   [provider]  mirtazapine  (REMERON ) 7.5 MG tablet Take 7.5 mg by mouth at bedtime. 08/07/24   [provider]  pantoprazole  (PROTONIX ) 40 MG tablet Take 1 tablet (40 mg total) by mouth daily. 05/30/24   Evonnie Lenis, MD  potassium chloride  (KLOR-CON ) 20 MEQ packet Take 20 mEq by mouth 2 (two) times daily. 08/04/24   [provider]  potassium chloride  SA (KLOR-CON  M) 20 MEQ tablet Take 20 mEq by mouth 2 (two) times daily. 03/18/24   [provider]  traMADol  (ULTRAM ) 50 MG tablet Take 1 tablet (50 mg total) by mouth every 12 (twelve) hours as needed for moderate pain (pain score 4-6) or severe pain (pain score 7-10). 07/16/24   Bryn Bernardino NOVAK, MD    Allergies:   Allergies  Allergen Reactions   Motrin [Ibuprofen] Rash   Seasonal Ic [Cholestatin] Other (See Comments)    Sneezing, watery eyes    Social History:  reports that she has never smoked. She has never used smokeless tobacco. She reports that she does not drink alcohol and does not use drugs.  Family History: Family History  Problem Relation Age of Onset   Obesity Sister    Breast cancer Sister        dx 42s   Breast cancer Sister    Kidney cancer Brother     Physical Exam: Vitals:   08/11/24 1213 08/11/24 1220 08/11/24 1848 08/11/24 1930  BP: (!) 142/69   (!) 171/117  Pulse: 61   68  Resp: 16   17  Temp: 99.5 F (37.5 C)  98.8 F (37.1 C)   TempSrc: Oral  Oral   SpO2:    93%  Weight:  63.8 kg     Height:  5' 4 (1.626 m)      General:  A&x, well developed and nourished, no acute distress Eyes: PERRLA, pink conjunctiva, no scleral icterus ENT: Moist oral mucosa, neck supple, no thyromegaly Lungs: clear to ascultation, no wheeze, no crackles, no use of accessory muscles Cardiovascular: regular rate and rhythm, no regurgitation, no gallops, no murmurs. No carotid bruits, no JVD Abdomen: soft, positive BS, non-tender, non-distended, no organomegaly, not an acute abdomen GU: not examined Neuro: CN II - XII grossly intact, sensation intact Musculoskeletal: strength 5/5 all extremities, no clubbing, cyanosis or edema Skin: no rash, no subcutaneous crepitation, no decubitus Psych: appropriate patient   Labs on Admission:  Recent Labs    08/11/24 1458  NA 138  K 3.6  CL 106  CO2  22  GLUCOSE 88  BUN 14  CREATININE 0.82  CALCIUM  8.7*  MG 1.6*    Recent Labs    08/11/24 1458  WBC 3.9*  NEUTROABS 2.8  HGB 9.0*  HCT 28.2*  MCV 100.4*  PLT 174    Radiological Exams on Admission: CT L-SPINE NO CHARGE Result Date: 08/11/2024 CLINICAL DATA:  back pain per pt rt side. Pt has hx of leukemia, compression fx and has a back brace at this time. Pt is from home. Pt states back pain not that bad. EXAM: CT LUMBAR SPINE WITHOUT CONTRAST TECHNIQUE: Multidetector CT imaging of the lumbar spine was performed without intravenous contrast administration. Multiplanar CT image reconstructions were also generated. RADIATION DOSE REDUCTION: This exam was performed according to the departmental dose-optimization program which includes automated exposure control, adjustment of the mA and/or kV according to patient size and/or use of iterative reconstruction technique. COMPARISON:  CT lumbar spine 05/27/2024, MRI lumbar spine 07/14/2024 FINDINGS: Segmentation: 5 lumbar type vertebrae. Alignment: Normal. Vertebrae: Interval development of age-indeterminate L1 superior endplate compression fracture with  at least 20% vertebral body height loss. Stable to slightly worsened L5 compression fracture with at least 35% vertebral body height loss. Chronic stable L2 compression fracture with similar-appearing 70% vertebral body height loss centrally. Chronic stable L3 compression fracture with at least 35% vertebral body height loss. No focal pathologic process. Paraspinal and other soft tissues: Negative. Disc levels: Maintained. IMPRESSION: 1. Interval development of age-indeterminate L1 superior endplate compression fracture with at least 20% vertebral body height loss. Correlate with point tenderness to palpation to evaluate for an acute component. 2. Stable to slightly worsened L5 compression fracture with at least 35% vertebral body height loss. Correlate with point tenderness to palpation to evaluate for an acute component. 3. Stable L2 and L3 compression fractures. Electronically Signed   By: Morgane  Naveau M.D.   On: 08/11/2024 17:04   CT Renal Stone Study Result Date: 08/11/2024 CLINICAL DATA:  Abdominal/flank pain, stone suspected Pt has hx of leukemia, compression fx and has a back brace at this time. Pt is from home. Pt states back pain not that bad. EXAM: CT ABDOMEN AND PELVIS WITHOUT CONTRAST TECHNIQUE: Multidetector CT imaging of the abdomen and pelvis was performed following the standard protocol without IV contrast. RADIATION DOSE REDUCTION: This exam was performed according to the departmental dose-optimization program which includes automated exposure control, adjustment of the mA and/or kV according to patient size and/or use of iterative reconstruction technique. COMPARISON:  None Available. FINDINGS: Lower chest: Left lower lobe dependent atelectasis. Small hiatal hernia. Hepatobiliary: No focal liver abnormality. No gallstones, gallbladder wall thickening, or pericholecystic fluid. No biliary dilatation. Pancreas: No focal lesion. Normal pancreatic contour. No surrounding inflammatory changes. No  main pancreatic ductal dilatation. Spleen: Normal in size without focal abnormality. Adrenals/Urinary Tract: No adrenal nodule bilaterally. No nephrolithiasis and no hydronephrosis. Fluid dense lesion of the left kidney likely represents a simple renal cyst. Simple renal cysts, in the absence of clinically indicated signs/symptoms, require no independent follow-up. No ureterolithiasis or hydroureter. The urinary bladder is unremarkable. Stomach/Bowel: Stomach is within normal limits. No evidence of bowel wall thickening or dilatation. Colonic diverticulosis. The appendix is not definitely identified with no inflammatory changes in the right lower quadrant to suggest acute appendicitis. Vascular/Lymphatic: No abdominal aorta or iliac aneurysm. Severe atherosclerotic plaque of the aorta and its branches. No abdominal, pelvic, or inguinal lymphadenopathy. Reproductive: Status post hysterectomy. No adnexal masses. Other: No intraperitoneal free fluid. No intraperitoneal free  gas. No organized fluid collection. Musculoskeletal: No abdominal wall hernia or abnormality. No suspicious lytic or blastic osseous lesions. No acute displaced fracture. Please see separately dictated CT lumbar spine 08/11/2024. IMPRESSION: 1. No acute intra-abdominal or intrapelvic abnormality with limited evaluation on this noncontrast study. 2. Small hiatal hernia. 3. Colonic diverticulosis with no acute diverticulitis. 4.  Aortic Atherosclerosis (ICD10-I70.0). 5. Please see separately dictated CT lumbar spine 08/11/2024. Electronically Signed   By: Morgane  Naveau M.D.   On: 08/11/2024 16:58    Assessment/Plan Present on Admission:  Intractable back pain - Lidoderm  patch.  Scheduled Tylenol  -.PRN Dilaudid  low-dose 0.5 mg - PT/OT consult - Social service for placement/rehab   Hypomagnesemia - Hold p.o. magnesium  ordered.  Magnesium  level in a.m.   T2DM - Sliding scale insulin    CAD //  HLD - ASA, atorvastatin  resumed    Dementia without behavioral disturbance (HCC) -Namenda  reordered   GERD (gastroesophageal reflux disease - Protonix  resumed   Breast cancer of upper-inner quadrant of left female breast (HCC)  Multiple myeloma not having achieved remission (HCC) - Arimidex  ordered - Weekly Velcade  ordered, dosage due [typically q. Thursdays however patient missed this Thursday's dose] - Revlimid  ordered  Brittany Archer 08/11/2024, 7:42 PM

## 2024-08-11 NOTE — ED Provider Notes (Signed)
  Physical Exam  BP (!) 142/69 (BP Location: Left Arm)   Pulse 61   Temp 98.8 F (37.1 C) (Oral)   Resp 16   Ht 5' 4 (1.626 m)   Wt 63.8 kg   SpO2 99%   BMI 24.14 kg/m   Physical Exam  Procedures  Procedures  ED Course / MDM   Clinical Course as of 08/11/24 1852  Fri Aug 11, 2024  1415 On review of prior medical records she was admitted in May for similar right back pain.  Found to have an L2 compression fracture and a urinary tract infection.  Neurosurgery recommended TLSO brace. [MB]    Clinical Course User Index [MB] Towana Ozell BROCKS, MD   Medical Decision Making Amount and/or Complexity of Data Reviewed Labs: ordered. Radiology: ordered.  Risk Prescription drug management.   Patient with back pain.  Recent lumbar compression fractures.  Now potentially new fracture at L1 along with worsening fracture L5.  Pain has been uncontrolled.  Patient cannot reportedly walk well.  Lives with sister.  With uncontrolled pain will admit for pain control and potential further treatment or placement.       Patsey Lot, MD 08/11/24 [phone]

## 2024-08-12 DIAGNOSIS — R52 Pain, unspecified: Secondary | ICD-10-CM | POA: Diagnosis not present

## 2024-08-12 DIAGNOSIS — C50212 Malignant neoplasm of upper-inner quadrant of left female breast: Secondary | ICD-10-CM | POA: Diagnosis not present

## 2024-08-12 DIAGNOSIS — M5489 Other dorsalgia: Secondary | ICD-10-CM | POA: Diagnosis not present

## 2024-08-12 DIAGNOSIS — C9000 Multiple myeloma not having achieved remission: Secondary | ICD-10-CM | POA: Diagnosis not present

## 2024-08-12 DIAGNOSIS — E11628 Type 2 diabetes mellitus with other skin complications: Secondary | ICD-10-CM | POA: Diagnosis not present

## 2024-08-12 LAB — GLUCOSE, CAPILLARY
Glucose-Capillary: 81 mg/dL (ref 70–99)
Glucose-Capillary: 122 mg/dL — ABNORMAL HIGH (ref 70–99)
Glucose-Capillary: 116 mg/dL — ABNORMAL HIGH (ref 70–99)
Glucose-Capillary: 132 mg/dL — ABNORMAL HIGH (ref 70–99)

## 2024-08-12 LAB — BASIC METABOLIC PANEL WITH GFR
Anion gap: 10 (ref 5–15)
BUN: 12 mg/dL (ref 8–23)
CO2: 25 mmol/L (ref 22–32)
Calcium: 8.9 mg/dL (ref 8.9–10.3)
Chloride: 105 mmol/L (ref 98–111)
Creatinine, Ser: 0.83 mg/dL (ref 0.44–1.00)
GFR, Estimated: >60 mL/min (ref >60)
Glucose, Bld: 77 mg/dL (ref 70–99)
Potassium: 3.7 mmol/L (ref 3.5–5.1)
Sodium: 140 mmol/L (ref 135–145)

## 2024-08-12 LAB — CBC
HCT: 30.6 % — ABNORMAL LOW (ref 36.0–46.0)
Hemoglobin: 9.8 g/dL — ABNORMAL LOW (ref 12.0–15.0)
MCH: 32.1 pg (ref 26.0–34.0)
MCHC: 32 g/dL (ref 30.0–36.0)
MCV: 100.3 fL — ABNORMAL HIGH (ref 80.0–100.0)
Platelets: 164 K/uL (ref 150–400)
RBC: 3.05 MIL/uL — ABNORMAL LOW (ref 3.87–5.11)
RDW: 18.5 % — ABNORMAL HIGH (ref 11.5–15.5)
WBC: 2.9 K/uL — ABNORMAL LOW (ref 4.0–10.5)
nRBC: 0 % (ref 0.0–0.2)

## 2024-08-12 LAB — MAGNESIUM: Magnesium: 1.7 mg/dL (ref 1.7–2.4)

## 2024-08-12 LAB — CBG MONITORING, ED: Glucose-Capillary: 78 mg/dL (ref 70–99)

## 2024-08-12 MED ORDER — MAGNESIUM SULFATE IN D5W 1-5 GM/100ML-% IV SOLN
1.0000 g | Freq: Once | INTRAVENOUS | Status: AC
  Administered 2024-08-12: 1 g via INTRAVENOUS
  Filled 2024-08-12: qty 100

## 2024-08-12 MED ORDER — OXYCODONE HCL 5 MG PO TABS
5.0000 mg | ORAL_TABLET | Freq: Four times a day (QID) | ORAL | Status: DC | PRN
  Administered 2024-08-14 – 2024-08-16 (×2): 5 mg via ORAL
  Filled 2024-08-12 (×2): qty 1

## 2024-08-12 MED ORDER — FENTANYL CITRATE PF 50 MCG/ML IJ SOSY: 12.5000 ug | PREFILLED_SYRINGE | INTRAMUSCULAR | Status: DC | PRN

## 2024-08-12 MED ORDER — ACETAMINOPHEN 325 MG PO TABS
650.0000 mg | ORAL_TABLET | Freq: Four times a day (QID) | ORAL | Status: DC
  Administered 2024-08-12 – 2024-08-16 (×11): 650 mg via ORAL
  Filled 2024-08-12 (×12): qty 2

## 2024-08-12 MED ORDER — MAGNESIUM SULFATE 50 % IJ SOLN: 3.0000 g | Freq: Once | INTRAVENOUS | Status: DC

## 2024-08-12 MED ORDER — CLOTRIMAZOLE 1 % EX CREA
TOPICAL_CREAM | Freq: Two times a day (BID) | CUTANEOUS | Status: DC
  Administered 2024-08-12: 1 via TOPICAL
  Filled 2024-08-12: qty 15

## 2024-08-12 MED ORDER — MAGNESIUM SULFATE 2 GM/50ML IV SOLN
2.0000 g | Freq: Once | INTRAVENOUS | Status: AC
  Administered 2024-08-12: 2 g via INTRAVENOUS
  Filled 2024-08-12: qty 50

## 2024-08-12 MED ORDER — LIDOCAINE 5 % EX PTCH
2.0000 | MEDICATED_PATCH | CUTANEOUS | Status: DC
  Administered 2024-08-12 – 2024-08-15 (×4): 2 via TRANSDERMAL
  Filled 2024-08-12 (×4): qty 2

## 2024-08-12 NOTE — Plan of Care (Signed)
  Problem: Acute Rehab PT Goals(only PT should resolve) Goal: Pt Will Go Supine/Side To Sit Outcome: Progressing Flowsheets (Taken 08/12/2024 1343) Pt will go Supine/Side to Sit: with minimal assist Goal: Patient Will Transfer Sit To/From Stand Outcome: Progressing Flowsheets (Taken 08/12/2024 1343) Patient will transfer sit to/from stand:  with minimal assist  with contact guard assist Goal: Pt Will Transfer Bed To Chair/Chair To Bed Outcome: Progressing Flowsheets (Taken 08/12/2024 1343) Pt will Transfer Bed to Chair/Chair to Bed:  with min assist  with contact guard assist Goal: Pt Will Ambulate Outcome: Progressing Flowsheets (Taken 08/12/2024 1343) Pt will Ambulate:  50 feet  with minimal assist  with moderate assist  with rolling walker   1:44 PM, 08/12/24 Lynwood Music, MPT Physical Therapist with [facility] [phone] office [phone] mobile phone

## 2024-08-12 NOTE — Plan of Care (Signed)

## 2024-08-12 NOTE — Hospital Course (Signed)
 83 year old female with past medical history significant for dementia, T2DM, CAD, multiple myeloma, bilateral breast cancer, CKD stage IIIb, GERD.  Patient was recently admitted 7/18 to 7/20 with complaints of back pain secondary to close compression fracture L2.  She was provided a TLSO brace, pain control to, and discharged.  Last night she went to bed, could not get up this morning secondary to increased back pain.  Visiting home nursing came by.  Patient sent to the ER.  Patient is incontinent of bowel and bladder but this is not new.   Pt was admitted for uncontrolled pain.

## 2024-08-12 NOTE — Progress Notes (Signed)
 PROGRESS NOTE   Brittany Archer  FMW:[id number] DOB: Jan 13, 1941 DOA: 08/11/2024 PCP: Katrinka Aquas, MD   Chief Complaint  Patient presents with   Back Pain   Level of care: Med-Surg  Brief Admission History:  83 year old female with past medical history significant for dementia, T2DM, CAD, multiple myeloma, bilateral breast cancer, CKD stage IIIb, GERD.  Patient was recently admitted 7/18 to 7/20 with complaints of back pain secondary to close compression fracture L2.  She was provided a TLSO brace, pain control to, and discharged.  Last night she went to bed, could not get up this morning secondary to increased back pain.  Visiting home nursing came by.  Patient sent to the ER.  Patient is incontinent of bowel and bladder but this is not new.   Pt was admitted for uncontrolled pain.     Assessment and Plan:  Uncontrolled back pain  --from compression fractures --agree with symptom management -- continue TLSO brace -- PT eval recommendation for SNF -- see orders  Hypomagnesemia -- IV replacement ordered -- recheck in AM  Type 2 DM controlled -- continue SSI coverage for now and CBG monitoring  CAD Hyperlipidemia -- restarted on home aspirin  and atorvastatin   Advanced dementia - resumed home memantine   GERD -- home pantoprazole  resumed   Left breast cancer Multiple Myeloma -- resumed home arimidex   DVT prophylaxis: sq heparin   Code Status: Full  Family Communication:  Disposition: anticipating SNF    Consultants:   Procedures:   Antimicrobials:    Subjective: Pt says back pain is a lot better now  Objective: Vitals:   08/12/24 0100 08/12/24 0125 08/12/24 0508 08/12/24 1423  BP:  (!) 173/70 (!) 178/62 111/69  Pulse:  65 65 64  Resp:  16 16 18   Temp: 98.1 F (36.7 C) 99.2 F (37.3 C) 97.8 F (36.6 C) 98 F (36.7 C)  TempSrc: Oral Oral Oral   SpO2:  98% 98% 100%  Weight:  64.5 kg    Height:        Intake/Output Summary (Last 24 hours) at  08/12/2024 1540 Last data filed at 08/12/2024 1518 Gross per 24 hour  Intake 220 ml  Output --  Net 220 ml   Filed Weights   08/11/24 1220 08/12/24 0125  Weight: 63.8 kg 64.5 kg   Examination:  General exam: Appears calm and comfortable  Respiratory system: Clear to auscultation. Respiratory effort normal. Cardiovascular system: normal S1 & S2 heard. No JVD, murmurs, rubs, gallops or clicks. No pedal edema. Gastrointestinal system: Abdomen is nondistended, soft and nontender. No organomegaly or masses felt. Normal bowel sounds heard. Central nervous system: Alert and disoriented. No focal neurological deficits. Extremities: Symmetric 5 x 5 power. Skin: No rashes, lesions or ulcers. Psychiatry: Judgement and insight appear diminished. Mood & affect appropriate.   Data Reviewed: I have personally reviewed following labs and imaging studies  CBC: Recent Labs  Lab 08/11/24 1458 08/12/24 0433  WBC 3.9* 2.9*  NEUTROABS 2.8  --   HGB 9.0* 9.8*  HCT 28.2* 30.6*  MCV 100.4* 100.3*  PLT 174 164    Basic Metabolic Panel: Recent Labs  Lab 08/11/24 1458 08/12/24 0433  NA 138 140  K 3.6 3.7  CL 106 105  CO2 22 25  GLUCOSE 88 77  BUN 14 12  CREATININE 0.82 0.83  CALCIUM  8.7* 8.9  MG 1.6* 1.7    CBG: Recent Labs  Lab 08/12/24 0000 08/12/24 0730 08/12/24 1122  GLUCAP 78 81 122*  No results found for this or any previous visit (from the past 240 hours).   Radiology Studies: CT L-SPINE NO CHARGE Result Date: 08/11/2024 CLINICAL DATA:  back pain per pt rt side. Pt has hx of leukemia, compression fx and has a back brace at this time. Pt is from home. Pt states back pain not that bad. EXAM: CT LUMBAR SPINE WITHOUT CONTRAST TECHNIQUE: Multidetector CT imaging of the lumbar spine was performed without intravenous contrast administration. Multiplanar CT image reconstructions were also generated. RADIATION DOSE REDUCTION: This exam was performed according to the departmental  dose-optimization program which includes automated exposure control, adjustment of the mA and/or kV according to patient size and/or use of iterative reconstruction technique. COMPARISON:  CT lumbar spine 05/27/2024, MRI lumbar spine 07/14/2024 FINDINGS: Segmentation: 5 lumbar type vertebrae. Alignment: Normal. Vertebrae: Interval development of age-indeterminate L1 superior endplate compression fracture with at least 20% vertebral body height loss. Stable to slightly worsened L5 compression fracture with at least 35% vertebral body height loss. Chronic stable L2 compression fracture with similar-appearing 70% vertebral body height loss centrally. Chronic stable L3 compression fracture with at least 35% vertebral body height loss. No focal pathologic process. Paraspinal and other soft tissues: Negative. Disc levels: Maintained. IMPRESSION: 1. Interval development of age-indeterminate L1 superior endplate compression fracture with at least 20% vertebral body height loss. Correlate with point tenderness to palpation to evaluate for an acute component. 2. Stable to slightly worsened L5 compression fracture with at least 35% vertebral body height loss. Correlate with point tenderness to palpation to evaluate for an acute component. 3. Stable L2 and L3 compression fractures. Electronically Signed   By: Morgane  Naveau M.D.   On: 08/11/2024 17:04   CT Renal Stone Study Result Date: 08/11/2024 CLINICAL DATA:  Abdominal/flank pain, stone suspected Pt has hx of leukemia, compression fx and has a back brace at this time. Pt is from home. Pt states back pain not that bad. EXAM: CT ABDOMEN AND PELVIS WITHOUT CONTRAST TECHNIQUE: Multidetector CT imaging of the abdomen and pelvis was performed following the standard protocol without IV contrast. RADIATION DOSE REDUCTION: This exam was performed according to the departmental dose-optimization program which includes automated exposure control, adjustment of the mA and/or kV  according to patient size and/or use of iterative reconstruction technique. COMPARISON:  None Available. FINDINGS: Lower chest: Left lower lobe dependent atelectasis. Small hiatal hernia. Hepatobiliary: No focal liver abnormality. No gallstones, gallbladder wall thickening, or pericholecystic fluid. No biliary dilatation. Pancreas: No focal lesion. Normal pancreatic contour. No surrounding inflammatory changes. No main pancreatic ductal dilatation. Spleen: Normal in size without focal abnormality. Adrenals/Urinary Tract: No adrenal nodule bilaterally. No nephrolithiasis and no hydronephrosis. Fluid dense lesion of the left kidney likely represents a simple renal cyst. Simple renal cysts, in the absence of clinically indicated signs/symptoms, require no independent follow-up. No ureterolithiasis or hydroureter. The urinary bladder is unremarkable. Stomach/Bowel: Stomach is within normal limits. No evidence of bowel wall thickening or dilatation. Colonic diverticulosis. The appendix is not definitely identified with no inflammatory changes in the right lower quadrant to suggest acute appendicitis. Vascular/Lymphatic: No abdominal aorta or iliac aneurysm. Severe atherosclerotic plaque of the aorta and its branches. No abdominal, pelvic, or inguinal lymphadenopathy. Reproductive: Status post hysterectomy. No adnexal masses. Other: No intraperitoneal free fluid. No intraperitoneal free gas. No organized fluid collection. Musculoskeletal: No abdominal wall hernia or abnormality. No suspicious lytic or blastic osseous lesions. No acute displaced fracture. Please see separately dictated CT lumbar spine 08/11/2024. IMPRESSION: 1.  No acute intra-abdominal or intrapelvic abnormality with limited evaluation on this noncontrast study. 2. Small hiatal hernia. 3. Colonic diverticulosis with no acute diverticulitis. 4.  Aortic Atherosclerosis (ICD10-I70.0). 5. Please see separately dictated CT lumbar spine 08/11/2024.  Electronically Signed   By: Morgane  Naveau M.D.   On: 08/11/2024 16:58    Scheduled Meds:  acetaminophen   650 mg Oral Q6H   anastrozole   1 mg Oral Daily   aspirin  EC  81 mg Oral Daily   atorvastatin   10 mg Oral Daily   clotrimazole    Topical BID   heparin   5,000 Units Subcutaneous Q8H   insulin  aspart  0-15 Units Subcutaneous TID WC   insulin  aspart  0-5 Units Subcutaneous QHS   lenalidomide   10 mg Oral Daily   lidocaine   2 patch Transdermal Q24H   memantine   5 mg Oral Daily   mirtazapine   7.5 mg Oral QHS   pantoprazole   40 mg Oral Daily   potassium chloride   20 mEq Oral BID   senna  1 tablet Oral BID   Continuous Infusions:   LOS: 1 day   Time spent: 38 mins  Riyanshi Wahab Vicci, MD How to contact the [facility] Attending or Consulting provider 7A - 7P or covering provider during after hours 7P -7A, for this patient?  Check the care team in [facility] and look for a) attending/consulting [facility] provider listed and b) the [facility] team listed Log into [email or web address] to find provider on call.  Locate the [facility] provider you are looking for under [facility] and page to a number that you can be directly reached. If you still have difficulty reaching the provider, please page the [facility] (Director on Call) for the Hospitalists listed on amion for assistance.  08/12/2024, 3:40 PM

## 2024-08-12 NOTE — Evaluation (Signed)
 Physical Therapy Evaluation Patient Details Name: Brittany Archer MRN: [id number] DOB: 22-Oct-1941 Today's Date: 08/12/2024  History of Present Illness  This is a 83 year old female with past medical history significant for dementia, T2DM, CAD, multiple myeloma, bilateral breast cancer, CKD stage IIIb, GERD.  Patient was recently admitted 7/18 to 7/20 with complaints of back pain secondary to close compression fracture L2.  She was provided a TLSO brace, pain control to, and discharged.  Last night she went to bed, could not get up this morning secondary to increased back pain.  Visiting home nursing came by.  Patient sent to the ER.  Patient is incontinent of bowel and bladder but this is not new.       CT L-spine  IMPRESSION:  1. Interval development of age-indeterminate L1 superior endplate  compression fracture with at least 20% vertebral body height loss.  Correlate with point tenderness to palpation to evaluate for an  acute component.  2. Stable to slightly worsened L5 compression fracture with at least  35% vertebral body height loss. Correlate with point tenderness to  palpation to evaluate for an acute component.  3. Stable L2 and L3 compression fractures.   Clinical Impression  Patient demonstrates slow labored movement for rolling to side and sitting up at bedside having to use bed rail with HOB partially raised, very unsteady on feet with near fall taking steps without AD, required use of RW for safety, able transfer to/from commode in bathroom and limited for gait training mostly due to c/o fatigue and having diarrhea. Patient left sitting on commode in bathroom with her sister in room - nursing staff notified. Patient will benefit from continued skilled physical therapy in hospital and recommended venue below to increase strength, balance, endurance for safe ADLs and gait.          If plan is discharge home, recommend the following: A lot of help with bathing/dressing/bathroom;A lot of  help with walking and/or transfers;Help with stairs or ramp for entrance;Assistance with cooking/housework   Can travel by private vehicle   Yes    Equipment Recommendations Rolling walker (2 wheels)  Recommendations for Other Services       Functional Status Assessment Patient has had a recent decline in their functional status and demonstrates the ability to make significant improvements in function in a reasonable and predictable amount of time.     Precautions / Restrictions Precautions Precautions: Fall Recall of Precautions/Restrictions: Intact Restrictions Weight Bearing Restrictions Per Provider Order: No      Mobility  Bed Mobility Overal bed mobility: Needs Assistance Bed Mobility: Rolling, Sidelying to Sit Rolling: Mod assist Sidelying to sit: Mod assist, HOB elevated       General bed mobility comments: slow labored movement, had to use bed rail with HOB partially raised    Transfers Overall transfer level: Needs assistance Equipment used: Rolling walker (2 wheels) Transfers: Sit to/from Stand, Bed to chair/wheelchair/BSC Sit to Stand: Min assist   Step pivot transfers: Min assist       General transfer comment: very unsteady on feet requiring bilateral hand held assist when not using an AD, required use of RW for safety    Ambulation/Gait Ambulation/Gait assistance: Min assist Gait Distance (Feet): 18 Feet Assistive device: Rolling walker (2 wheels) Gait Pattern/deviations: Decreased step length - right, Decreased step length - left, Decreased stride length Gait velocity: decreased     General Gait Details: slow slow labored movement using RW without loss of balance, limited mostly due  to fatigue and diarrhea  Stairs            Wheelchair Mobility     Tilt Bed    Modified Rankin (Stroke Patients Only)       Balance Overall balance assessment: Needs assistance Sitting-balance support: Feet supported, No upper extremity  supported Sitting balance-Leahy Scale: Fair Sitting balance - Comments: seated at EOB   Standing balance support: During functional activity, No upper extremity supported Standing balance-Leahy Scale: Poor Standing balance comment: fair using RW                             Pertinent Vitals/Pain Pain Assessment Pain Assessment: No/denies pain    Home Living Family/patient expects to be discharged to:: Private residence Living Arrangements: Other relatives Available Help at Discharge: Family;Available 24 hours/day Type of Home: House Home Access: Level entry       Home Layout: One level Home Equipment: Grab bars - tub/shower      Prior Function Prior Level of Function : Needs assist       Physical Assist : Mobility (physical);ADLs (physical) Mobility (physical): Bed mobility;Transfers;Gait;Stairs   Mobility Comments: household ambulation without AD ADLs Comments: Independent with most household, assisted by family for community     Extremity/Trunk Assessment   Upper Extremity Assessment Upper Extremity Assessment: Defer to OT evaluation    Lower Extremity Assessment Lower Extremity Assessment: Generalized weakness    Cervical / Trunk Assessment Cervical / Trunk Assessment: Kyphotic  Communication   Communication Communication: No apparent difficulties    Cognition Arousal: Alert Behavior During Therapy: WFL for tasks assessed/performed   PT - Cognitive impairments: No apparent impairments                         Following commands: Intact       Cueing Cueing Techniques: Verbal cues, Tactile cues     General Comments      Exercises     Assessment/Plan    PT Assessment Patient needs continued PT services  PT Problem List Decreased strength;Decreased activity tolerance;Decreased balance;Decreased mobility       PT Treatment Interventions DME instruction;Gait training;Stair training;Functional mobility training;Therapeutic  activities;Therapeutic exercise;Balance training;Patient/family education    PT Goals (Current goals can be found in the Care Plan section)  Acute Rehab PT Goals Patient Stated Goal: return home with family to assist PT Goal Formulation: With patient/family Time For Goal Achievement: 08/26/24 Potential to Achieve Goals: Good    Frequency Min 3X/week     Co-evaluation               AM-PAC PT 6 Clicks Mobility  Outcome Measure Help needed turning from your back to your side while in a flat bed without using bedrails?: A Lot Help needed moving from lying on your back to sitting on the side of a flat bed without using bedrails?: A Lot Help needed moving to and from a bed to a chair (including a wheelchair)?: A Little Help needed standing up from a chair using your arms (e.g., wheelchair or bedside chair)?: A Little Help needed to walk in hospital room?: A Little Help needed climbing 3-5 steps with a railing? : A Lot 6 Click Score: 15    End of Session   Activity Tolerance: Patient tolerated treatment well;Patient limited by fatigue Patient left: in chair;with call bell/phone within reach;with family/visitor present Nurse Communication: Mobility status PT Visit Diagnosis: Unsteadiness on  feet (R26.81);Other abnormalities of gait and mobility (R26.89);Muscle weakness (generalized) (M62.81)    Time: 8949-8888 PT Time Calculation (min) (ACUTE ONLY): 21 min   Charges:   PT Evaluation $PT Eval Moderate Complexity: 1 Mod PT Treatments $Therapeutic Activity: 23-37 mins PT General Charges $$ ACUTE PT VISIT: 1 Visit         1:41 PM, 08/12/24 Lynwood Music, MPT Physical Therapist with [facility] 336 [phone] office [phone] mobile phone

## 2024-08-12 NOTE — Plan of Care (Signed)
   Problem: Education: Goal: Ability to describe self-care measures that may prevent or decrease complications (Diabetes Survival Skills Education) will improve Outcome: Progressing Goal: Individualized Educational Video(s) Outcome: Progressing   Problem: Coping: Goal: Ability to adjust to condition or change in health will improve Outcome: Progressing

## 2024-08-12 NOTE — ED Notes (Signed)
 Pt's family Tonda updated that pt is going to room and room number. Pt had BM; cleaned and changed.

## 2024-08-13 DIAGNOSIS — R52 Pain, unspecified: Secondary | ICD-10-CM | POA: Diagnosis not present

## 2024-08-13 DIAGNOSIS — N1832 Chronic kidney disease, stage 3b: Secondary | ICD-10-CM

## 2024-08-13 DIAGNOSIS — M5489 Other dorsalgia: Secondary | ICD-10-CM | POA: Diagnosis not present

## 2024-08-13 DIAGNOSIS — E11628 Type 2 diabetes mellitus with other skin complications: Secondary | ICD-10-CM | POA: Diagnosis not present

## 2024-08-13 DIAGNOSIS — C50212 Malignant neoplasm of upper-inner quadrant of left female breast: Secondary | ICD-10-CM | POA: Diagnosis not present

## 2024-08-13 LAB — GLUCOSE, CAPILLARY
Glucose-Capillary: 103 mg/dL — ABNORMAL HIGH (ref 70–99)
Glucose-Capillary: 124 mg/dL — ABNORMAL HIGH (ref 70–99)
Glucose-Capillary: 91 mg/dL (ref 70–99)
Glucose-Capillary: 87 mg/dL (ref 70–99)

## 2024-08-13 LAB — BASIC METABOLIC PANEL WITH GFR
Anion gap: 10 (ref 5–15)
BUN: 15 mg/dL (ref 8–23)
CO2: 25 mmol/L (ref 22–32)
Calcium: 8.8 mg/dL — ABNORMAL LOW (ref 8.9–10.3)
Chloride: 103 mmol/L (ref 98–111)
Creatinine, Ser: 1.06 mg/dL — ABNORMAL HIGH (ref 0.44–1.00)
GFR, Estimated: 52 mL/min — ABNORMAL LOW (ref >60)
Glucose, Bld: 100 mg/dL — ABNORMAL HIGH (ref 70–99)
Potassium: 3 mmol/L — ABNORMAL LOW (ref 3.5–5.1)
Sodium: 138 mmol/L (ref 135–145)

## 2024-08-13 LAB — MAGNESIUM: Magnesium: 2.5 mg/dL — ABNORMAL HIGH (ref 1.7–2.4)

## 2024-08-13 NOTE — Progress Notes (Addendum)
 PROGRESS NOTE   Brittany Archer  FMW:[id number] DOB: 1941/09/19 DOA: 08/11/2024 PCP: Katrinka Aquas, MD   Chief Complaint  Patient presents with   Back Pain   Level of care: Med-Surg  Brief Admission History:  83 year old female with past medical history significant for dementia, T2DM, CAD, multiple myeloma, bilateral breast cancer, CKD stage IIIb, GERD.  Patient was recently admitted 7/18 to 7/20 with complaints of back pain secondary to close compression fracture L2.  She was provided a TLSO brace, pain control to, and discharged.  Last night she went to bed, could not get up this morning secondary to increased back pain.  Visiting home nursing came by.  Patient sent to the ER.  Patient is incontinent of bowel and bladder but this is not new.   Pt was admitted for uncontrolled pain.     Assessment and Plan:  Uncontrolled back pain  --from compression fractures --agree with symptom management -- continue TLSO brace -- PT eval recommendation for SNF -- see orders for pain management   Hypomagnesemia -- IV replacement ordered -- repleted   Hypokalemia -- Mg has been repleted. -- replacement ordered KCl 20 meq BID   -- recheck in AM   Type 2 DM controlled -- continue SSI coverage for now and CBG monitoring CBG (last 3)  Recent Labs    08/12/24 2058 08/13/24 0726 08/13/24 1132  GLUCAP 132* 103* 124*    CAD Hyperlipidemia -- restarted on home aspirin  and atorvastatin   Advanced dementia - resumed home memantine   GERD -- home pantoprazole  resumed   Left breast cancer Multiple Myeloma -- resumed home arimidex   DVT prophylaxis: sq heparin   Code Status: Full  Family Communication:  Disposition: anticipating SNF    Consultants:   Procedures:   Antimicrobials:    Subjective: Pt still having back pain but it is better controlled, she is wearing TLSO brace.   Objective: Vitals:   08/12/24 1423 08/12/24 2030 08/13/24 0448 08/13/24 1350  BP: 111/69 (!)  110/59 (!) 159/61 (!) 144/64  Pulse: 64 73 (!) 58 68  Resp: 18 18 16 16   Temp: 98 F (36.7 C) 98.7 F (37.1 C) 97.9 F (36.6 C) 97.8 F (36.6 C)  TempSrc:  Oral Oral   SpO2: 100% 97% 97% 97%  Weight:      Height:        Intake/Output Summary (Last 24 hours) at 08/13/2024 1438 Last data filed at 08/13/2024 0516 Gross per 24 hour  Intake 940 ml  Output 500 ml  Net 440 ml   Filed Weights   08/11/24 1220 08/12/24 0125  Weight: 63.8 kg 64.5 kg   Examination:  General exam: Appears calm and comfortable. Pt wearing TLSO brace.   Respiratory system: Clear to auscultation. Respiratory effort normal. Cardiovascular system: normal S1 & S2 heard. No JVD, murmurs, rubs, gallops or clicks. No pedal edema. Gastrointestinal system: Abdomen is nondistended, soft and nontender. No organomegaly or masses felt. Normal bowel sounds heard. Central nervous system: Alert and disoriented. No focal neurological deficits. Extremities: Symmetric 5 x 5 power. Skin: No rashes, lesions or ulcers. Psychiatry: Judgement and insight appear diminished. Mood & affect appropriate.   Data Reviewed: I have personally reviewed following labs and imaging studies  CBC: Recent Labs  Lab 08/11/24 1458 08/12/24 0433  WBC 3.9* 2.9*  NEUTROABS 2.8  --   HGB 9.0* 9.8*  HCT 28.2* 30.6*  MCV 100.4* 100.3*  PLT 174 164    Basic Metabolic Panel: Recent Labs  Lab  08/11/24 1458 08/12/24 0433 08/13/24 0507  NA 138 140 138  K 3.6 3.7 3.0*  CL 106 105 103  CO2 22 25 25   GLUCOSE 88 77 100*  BUN 14 12 15   CREATININE 0.82 0.83 1.06*  CALCIUM  8.7* 8.9 8.8*  MG 1.6* 1.7 2.5*    CBG: Recent Labs  Lab 08/12/24 1122 08/12/24 1632 08/12/24 2058 08/13/24 0726 08/13/24 1132  GLUCAP 122* 116* 132* 103* 124*    No results found for this or any previous visit (from the past 240 hours).   Radiology Studies: CT L-SPINE NO CHARGE Result Date: 08/11/2024 CLINICAL DATA:  back pain per pt rt side. Pt has hx of  leukemia, compression fx and has a back brace at this time. Pt is from home. Pt states back pain not that bad. EXAM: CT LUMBAR SPINE WITHOUT CONTRAST TECHNIQUE: Multidetector CT imaging of the lumbar spine was performed without intravenous contrast administration. Multiplanar CT image reconstructions were also generated. RADIATION DOSE REDUCTION: This exam was performed according to the departmental dose-optimization program which includes automated exposure control, adjustment of the mA and/or kV according to patient size and/or use of iterative reconstruction technique. COMPARISON:  CT lumbar spine 05/27/2024, MRI lumbar spine 07/14/2024 FINDINGS: Segmentation: 5 lumbar type vertebrae. Alignment: Normal. Vertebrae: Interval development of age-indeterminate L1 superior endplate compression fracture with at least 20% vertebral body height loss. Stable to slightly worsened L5 compression fracture with at least 35% vertebral body height loss. Chronic stable L2 compression fracture with similar-appearing 70% vertebral body height loss centrally. Chronic stable L3 compression fracture with at least 35% vertebral body height loss. No focal pathologic process. Paraspinal and other soft tissues: Negative. Disc levels: Maintained. IMPRESSION: 1. Interval development of age-indeterminate L1 superior endplate compression fracture with at least 20% vertebral body height loss. Correlate with point tenderness to palpation to evaluate for an acute component. 2. Stable to slightly worsened L5 compression fracture with at least 35% vertebral body height loss. Correlate with point tenderness to palpation to evaluate for an acute component. 3. Stable L2 and L3 compression fractures. Electronically Signed   By: Morgane  Naveau M.D.   On: 08/11/2024 17:04   CT Renal Stone Study Result Date: 08/11/2024 CLINICAL DATA:  Abdominal/flank pain, stone suspected Pt has hx of leukemia, compression fx and has a back brace at this time. Pt is  from home. Pt states back pain not that bad. EXAM: CT ABDOMEN AND PELVIS WITHOUT CONTRAST TECHNIQUE: Multidetector CT imaging of the abdomen and pelvis was performed following the standard protocol without IV contrast. RADIATION DOSE REDUCTION: This exam was performed according to the departmental dose-optimization program which includes automated exposure control, adjustment of the mA and/or kV according to patient size and/or use of iterative reconstruction technique. COMPARISON:  None Available. FINDINGS: Lower chest: Left lower lobe dependent atelectasis. Small hiatal hernia. Hepatobiliary: No focal liver abnormality. No gallstones, gallbladder wall thickening, or pericholecystic fluid. No biliary dilatation. Pancreas: No focal lesion. Normal pancreatic contour. No surrounding inflammatory changes. No main pancreatic ductal dilatation. Spleen: Normal in size without focal abnormality. Adrenals/Urinary Tract: No adrenal nodule bilaterally. No nephrolithiasis and no hydronephrosis. Fluid dense lesion of the left kidney likely represents a simple renal cyst. Simple renal cysts, in the absence of clinically indicated signs/symptoms, require no independent follow-up. No ureterolithiasis or hydroureter. The urinary bladder is unremarkable. Stomach/Bowel: Stomach is within normal limits. No evidence of bowel wall thickening or dilatation. Colonic diverticulosis. The appendix is not definitely identified with no inflammatory changes  in the right lower quadrant to suggest acute appendicitis. Vascular/Lymphatic: No abdominal aorta or iliac aneurysm. Severe atherosclerotic plaque of the aorta and its branches. No abdominal, pelvic, or inguinal lymphadenopathy. Reproductive: Status post hysterectomy. No adnexal masses. Other: No intraperitoneal free fluid. No intraperitoneal free gas. No organized fluid collection. Musculoskeletal: No abdominal wall hernia or abnormality. No suspicious lytic or blastic osseous lesions. No  acute displaced fracture. Please see separately dictated CT lumbar spine 08/11/2024. IMPRESSION: 1. No acute intra-abdominal or intrapelvic abnormality with limited evaluation on this noncontrast study. 2. Small hiatal hernia. 3. Colonic diverticulosis with no acute diverticulitis. 4.  Aortic Atherosclerosis (ICD10-I70.0). 5. Please see separately dictated CT lumbar spine 08/11/2024. Electronically Signed   By: Morgane  Naveau M.D.   On: 08/11/2024 16:58    Scheduled Meds:  acetaminophen   650 mg Oral Q6H   anastrozole   1 mg Oral Daily   aspirin  EC  81 mg Oral Daily   atorvastatin   10 mg Oral Daily   clotrimazole    Topical BID   heparin   5,000 Units Subcutaneous Q8H   insulin  aspart  0-15 Units Subcutaneous TID WC   insulin  aspart  0-5 Units Subcutaneous QHS   lenalidomide   10 mg Oral Daily   lidocaine   2 patch Transdermal Q24H   memantine   5 mg Oral Daily   mirtazapine   7.5 mg Oral QHS   pantoprazole   40 mg Oral Daily   potassium chloride   20 mEq Oral BID   senna  1 tablet Oral BID   Continuous Infusions:   LOS: 2 days   Time spent: 50 mins  Crystelle Ferrufino Vicci, MD How to contact the [facility] Attending or Consulting provider 7A - 7P or covering provider during after hours 7P -7A, for this patient?  Check the care team in [facility] and look for a) attending/consulting TR[facility] provider listed and b) the TRH team listed Log into [email or web address] to find provider on call.  Locate the [facility] provider you are looking for under [facility] and page to a number that you can be directly reached. If you still have difficulty reaching the provider, please page the [facility] (Director on Call) for the Hospitalists listed on amion for assistance.  08/13/2024, 2:38 PM

## 2024-08-13 NOTE — [place] FL2 (Signed)
 [place]  MEDICAID FL2 LEVEL OF CARE FORM     IDENTIFICATION  Patient Name: Brittany Archer Birthdate: [birth year] Sex: female Admission Date (Current Location): 08/11/2024  [facility] and [place] Number:  [employer] and Address:  [facility],  [address] [place] [address]      Provider Number: [id number]  Attending Physician Name and Address:  Vicci Afton CROME, MD  Relative Name and Phone Number:       Current Level of Care: Hospital Recommended Level of Care: Skilled Nursing Facility Prior Approval Number:    Date Approved/Denied:   PASRR Number: [id number]  Discharge Plan: SNF    Current Diagnoses: Patient Active Problem List   Diagnosis Date Noted   Intractable pain 08/11/2024   Cystitis 05/29/2024   Closed compression fracture of L2 lumbar vertebra, initial encounter (HCC) 05/27/2024   Type 2 diabetes mellitus with hyperglycemia (HCC) 05/27/2024   GERD (gastroesophageal reflux disease) 05/27/2024   Genetic testing 05/04/2024   Breast cancer of upper-inner quadrant of left female breast (HCC) 03/30/2024   Bilateral breast cancer (HCC) 03/30/2024   Macrocytic anemia 03/14/2022   Hypoglycemia 03/14/2022   CKD (chronic kidney disease), stage III B 03/14/2022   UTI (urinary tract infection) 03/14/2022   Dementia without behavioral disturbance (HCC) 03/14/2022   Open mouth wound 11/15/2020   Encounter for peripheral line placement 03/25/2020   PICC (peripherally inserted central catheter) in place 03/18/2020   Leukopenia 03/18/2020   Osteomyelitis, jaw acute    Osteonecrosis (HCC)    Confusion 02/20/2020   Facial cellulitis 02/19/2020   Type 2 diabetes mellitus (HCC)    Coronary artery disease    Hypertension    Stage 3b chronic kidney disease (HCC)    Hypomagnesemia    Hypophosphatemia    Hypokalemia 01/03/2020   CKD (chronic kidney disease) 05/01/2019   B12 deficiency 02/09/2018   Multiple myeloma not having achieved  remission (HCC) 01/20/2018   Iron deficiency anemia 11/25/2017    Orientation RESPIRATION BLADDER Height & Weight     Self, Situation, Place  Normal Incontinent Weight: 64.5 kg Height:  5' 4 (162.6 cm)  BEHAVIORAL SYMPTOMS/MOOD NEUROLOGICAL BOWEL NUTRITION STATUS      Continent Diet (see dc summary)  AMBULATORY STATUS COMMUNICATION OF NEEDS Skin   Extensive Assist Verbally Other (Comment) (rash to groin)                       Personal Care Assistance Level of Assistance  Bathing, Feeding, Dressing Bathing Assistance: Maximum assistance Feeding assistance: Independent Dressing Assistance: Maximum assistance     Functional Limitations Info  Sight, Hearing, Speech Sight Info: Adequate Hearing Info: Adequate Speech Info: Adequate    SPECIAL CARE FACTORS FREQUENCY  PT (By licensed PT), OT (By licensed OT)     PT Frequency: 5x weekly OT Frequency: 5x weekly            Contractures Contractures Info: Not present    Additional Factors Info  Code Status, Allergies Code Status Info: Full Allergies Info: Motrin (Ibuprofen), Seasonal Ic (Cholestatin)           Current Medications (08/13/2024):  This is the current hospital active medication list Current Facility-Administered Medications  Medication Dose Route Frequency Provider Last Rate Last Admin   acetaminophen  (TYLENOL ) tablet 650 mg  650 mg Oral Q6H Johnson, Clanford L, MD   650 mg at 08/13/24 1840   anastrozole  (ARIMIDEX ) tablet 1 mg  1 mg Oral Daily Crosley,  Debby, MD   1 mg at 08/13/24 0943   aspirin  EC tablet 81 mg  81 mg Oral Daily Crosley, Debby, MD   81 mg at 08/13/24 9061   atorvastatin  (LIPITOR) tablet 10 mg  10 mg Oral Daily Crosley, Debby, MD   10 mg at 08/13/24 0944   clotrimazole  (LOTRIMIN ) 1 % cream   Topical BID Laveda Roosevelt, MD   Given at 08/13/24 2151   fentaNYL  (SUBLIMAZE ) injection 12.5 mcg  12.5 mcg Intravenous Q2H PRN Vicci, Clanford L, MD       heparin  injection 5,000 Units  5,000  Units Subcutaneous Q8H Crosley, Debby, MD   5,000 Units at 08/13/24 2151   insulin  aspart (novoLOG ) injection 0-15 Units  0-15 Units Subcutaneous TID WC Crosley, Debby, MD   2 Units at 08/13/24 1242   insulin  aspart (novoLOG ) injection 0-5 Units  0-5 Units Subcutaneous QHS Crosley, Debby, MD       lenalidomide  (REVLIMID ) capsule 10 mg  10 mg Oral Daily Crosley, Debby, MD       lidocaine  (LIDODERM ) 5 % 2 patch  2 patch Transdermal Q24H Johnson, Clanford L, MD   2 patch at 08/13/24 1840   memantine  (NAMENDA ) tablet 5 mg  5 mg Oral Daily Crosley, Debby, MD   5 mg at 08/13/24 9056   mirtazapine  (REMERON ) tablet 7.5 mg  7.5 mg Oral QHS Crosley, Debby, MD   7.5 mg at 08/13/24 2151   oxyCODONE  (Oxy IR/ROXICODONE ) immediate release tablet 5 mg  5 mg Oral Q6H PRN Johnson, Clanford L, MD       pantoprazole  (PROTONIX ) EC tablet 40 mg  40 mg Oral Daily Crosley, Debby, MD   40 mg at 08/13/24 9062   potassium chloride  (KLOR-CON ) packet 20 mEq  20 mEq Oral BID Laveda Roosevelt, MD   20 mEq at 08/13/24 2150   senna (SENOKOT) tablet 8.6 mg  1 tablet Oral BID Laveda Roosevelt, MD   8.6 mg at 08/13/24 2151     Discharge Medications: Please see discharge summary for a list of discharge medications.  Relevant Imaging Results:  Relevant Lab Results:   Additional Information [id number]  Nena LITTIE Coffee, RN

## 2024-08-13 NOTE — TOC Initial Note (Signed)
 Transition of Care [facility]) - Initial/Assessment Note    Patient Details  Name: Brittany Archer MRN: [id number] Date of Birth: 04-26-1941  [facility]) CM/SW Contact:    Nena LITTIE Coffee, RN Phone Number: 08/13/2024, 10:05 PM  Clinical Narrative:                 Pt admitted c/uncontrolled pain. Previous admission of 7/18 for L2 compression fracture. Pt is pleasantly confused/forgetful. Assessed for SNF placement.  Expected Discharge Plan: Skilled Nursing Facility Barriers to Discharge: Continued Medical Work up   Patient Goals and CMS Choice            Expected Discharge Plan and Services In-house Referral: Clinical Social Work Discharge Planning Services: CM Consult   Living arrangements for the past 2 months: Single Family Home, Skilled Nursing Facility                                      Prior Living Arrangements/Services Living arrangements for the past 2 months: Single Family Home, Skilled Nursing Facility Lives with:: Facility Resident, Siblings Patient language and need for interpreter reviewed:: Yes        Need for Family Participation in Patient Care: Yes (Comment) Care giver support system in place?: Yes (comment)   Criminal Activity/Legal Involvement Pertinent to Current Situation/Hospitalization: No - Comment as needed  Activities of Daily Living   ADL Screening (condition at time of admission) Independently performs ADLs?: Yes (appropriate for developmental age) Is the patient deaf or have difficulty hearing?: No Does the patient have difficulty seeing, even when wearing glasses/contacts?: No Does the patient have difficulty concentrating, remembering, or making decisions?: No  Permission Sought/Granted                  Emotional Assessment Appearance:: Appears stated age   Affect (typically observed): Calm Orientation: : Oriented to Self Alcohol / Substance Use: Not Applicable Psych Involvement: No  (comment)  Admission diagnosis:  Intractable pain [R52] Compression fracture of lumbar vertebra, unspecified lumbar vertebral level, initial encounter (HCC) [S32.000A] Patient Active Problem List   Diagnosis Date Noted   Intractable pain 08/11/2024   Cystitis 05/29/2024   Closed compression fracture of L2 lumbar vertebra, initial encounter (HCC) 05/27/2024   Type 2 diabetes mellitus with hyperglycemia (HCC) 05/27/2024   GERD (gastroesophageal reflux disease) 05/27/2024   Genetic testing 05/04/2024   Breast cancer of upper-inner quadrant of left female breast (HCC) 03/30/2024   Bilateral breast cancer (HCC) 03/30/2024   Macrocytic anemia 03/14/2022   Hypoglycemia 03/14/2022   CKD (chronic kidney disease), stage III B 03/14/2022   UTI (urinary tract infection) 03/14/2022   Dementia without behavioral disturbance (HCC) 03/14/2022   Open mouth wound 11/15/2020   Encounter for peripheral line placement 03/25/2020   PICC (peripherally inserted central catheter) in place 03/18/2020   Leukopenia 03/18/2020   Osteomyelitis, jaw acute    Osteonecrosis (HCC)    Confusion 02/20/2020   Facial cellulitis 02/19/2020   Type 2 diabetes mellitus (HCC)    Coronary artery disease    Hypertension    Stage 3b chronic kidney disease (HCC)    Hypomagnesemia    Hypophosphatemia    Hypokalemia 01/03/2020   CKD (chronic kidney disease) 05/01/2019   B12 deficiency 02/09/2018   Multiple myeloma not having achieved remission (HCC) 01/20/2018   Iron deficiency anemia 11/25/2017   PCP:  Katrinka Aquas, MD Pharmacy:  [facility] - [place], [place] - [address] [place] [address] Phone: [phone] Fax: [phone]  [facility] [phone] - [place], [place] - [address] AT [facility] [address] [place] [address] Phone: [phone] Fax: [phone]  [facility] - [place], [place] - [address] [place] [address] Phone: [phone] Fax: [phone]  [facility] - [place], [place] - [address] [place] [address] Phone: [phone] Fax: [phone]  [facility] - [place], [place] [address] [place] [address] Phone: [phone] Fax: [phone]     Social Drivers of Health (SDOH) Social History: SDOH Screenings   Food Insecurity: No Food Insecurity (08/12/2024)  Housing: Low Risk  (08/12/2024)  Transportation Needs: No Transportation Needs (08/12/2024)  Utilities: Not At Risk (08/12/2024)  Alcohol Screen: Low Risk  (12/09/2020)  Depression (PHQ2-9): Low Risk  (07/27/2024)  Financial Resource Strain: Low Risk  (12/09/2020)  Physical Activity: Inactive (12/09/2020)  Social Connections: Moderately Isolated (08/12/2024)  Stress: No Stress Concern Present (12/09/2020)  Tobacco Use: Low Risk  (08/11/2024)   SDOH Interventions:     Readmission Risk Interventions     No data to display

## 2024-08-14 DIAGNOSIS — N1832 Chronic kidney disease, stage 3b: Secondary | ICD-10-CM | POA: Diagnosis not present

## 2024-08-14 DIAGNOSIS — E876 Hypokalemia: Secondary | ICD-10-CM | POA: Diagnosis not present

## 2024-08-14 DIAGNOSIS — R52 Pain, unspecified: Secondary | ICD-10-CM | POA: Diagnosis not present

## 2024-08-14 DIAGNOSIS — M5489 Other dorsalgia: Secondary | ICD-10-CM | POA: Diagnosis not present

## 2024-08-14 LAB — GLUCOSE, CAPILLARY
Glucose-Capillary: 92 mg/dL (ref 70–99)
Glucose-Capillary: 110 mg/dL — ABNORMAL HIGH (ref 70–99)
Glucose-Capillary: 132 mg/dL — ABNORMAL HIGH (ref 70–99)
Glucose-Capillary: 134 mg/dL — ABNORMAL HIGH (ref 70–99)

## 2024-08-14 LAB — BASIC METABOLIC PANEL WITH GFR
Anion gap: 9 (ref 5–15)
BUN: 12 mg/dL (ref 8–23)
CO2: 25 mmol/L (ref 22–32)
Calcium: 8.4 mg/dL — ABNORMAL LOW (ref 8.9–10.3)
Chloride: 104 mmol/L (ref 98–111)
Creatinine, Ser: 0.88 mg/dL (ref 0.44–1.00)
GFR, Estimated: >60 mL/min (ref >60)
Glucose, Bld: 95 mg/dL (ref 70–99)
Potassium: 3.1 mmol/L — ABNORMAL LOW (ref 3.5–5.1)
Sodium: 138 mmol/L (ref 135–145)

## 2024-08-14 MED ORDER — POTASSIUM CHLORIDE 20 MEQ PO PACK
40.0000 meq | PACK | Freq: Two times a day (BID) | ORAL | Status: DC
  Administered 2024-08-14 – 2024-08-15 (×3): 40 meq via ORAL
  Filled 2024-08-14 (×4): qty 2

## 2024-08-14 NOTE — Evaluation (Signed)
 Occupational Therapy Evaluation Patient Details Name: Brittany Archer MRN: [id number] DOB: 11-Aug-1941 Today's Date: 08/14/2024   History of Present Illness   This is a 83 year old female with past medical history significant for dementia, T2DM, CAD, multiple myeloma, bilateral breast cancer, CKD stage IIIb, GERD.  Patient was recently admitted 7/18 to 7/20 with complaints of back pain secondary to close compression fracture L2.  She was provided a TLSO brace, pain control to, and discharged.  Last night she went to bed, could not get up this morning secondary to increased back pain.  Visiting home nursing came by.  Patient sent to the ER.  Patient is incontinent of bowel and bladder but this is not new. (per MD)     Clinical Impressions Pt agreeable to OT evaluation. Pt able to follow commands well and wearing TLSO while in bed. CGA for bed mobility and CGA to min A for transfer to chair. Able to ambulate with RW and stand at sink for grooming with CGA. Pt also able to complete peri-care/toileting with CGA. Pt left in the chair with call bell within reach and chair alarm set. Pt will benefit from continued OT in the hospital and recommended venue below to increase strength, balance, and endurance for safe ADL's.         If plan is discharge home, recommend the following:   A little help with walking and/or transfers;A little help with bathing/dressing/bathroom;Assistance with cooking/housework;Assist for transportation;Help with stairs or ramp for entrance     Functional Status Assessment   Patient has had a recent decline in their functional status and demonstrates the ability to make significant improvements in function in a reasonable and predictable amount of time.     Equipment Recommendations   None recommended by OT             Precautions/Restrictions   Precautions Precautions: Fall Recall of Precautions/Restrictions: Intact Required Braces or Orthoses:  Spinal Brace Spinal Brace: Thoracolumbosacral orthotic Restrictions Weight Bearing Restrictions Per Provider Order: No     Mobility Bed Mobility Overal bed mobility: Needs Assistance Bed Mobility: Sidelying to Sit   Sidelying to sit: Contact guard assist, HOB elevated, Used rails       General bed mobility comments: labored movement    Transfers Overall transfer level: Needs assistance Equipment used: Rolling walker (2 wheels) Transfers: Sit to/from Stand, Bed to chair/wheelchair/BSC Sit to Stand: Contact guard assist, Min assist     Step pivot transfers: Contact guard assist     General transfer comment: EOB to chair with RW      Balance Overall balance assessment: Needs assistance Sitting-balance support: Feet supported, No upper extremity supported Sitting balance-Leahy Scale: Fair Sitting balance - Comments: seated at EOB   Standing balance support: During functional activity, No upper extremity supported Standing balance-Leahy Scale: Fair Standing balance comment: using RW                           ADL either performed or assessed with clinical judgement   ADL Overall ADL's : Needs assistance/impaired     Grooming: Contact guard assist;Standing;Wash/dry hands Grooming Details (indicate cue type and reason): standing at the sink with RW available to wash hands Upper Body Bathing: Minimal assistance;Set up;Sitting   Lower Body Bathing: Minimal assistance;Sitting/lateral leans   Upper Body Dressing : Set up;Minimal assistance;Sitting   Lower Body Dressing: Minimal assistance;Sitting/lateral leans   Toilet Transfer: Contact guard assist;Minimal assistance;Rolling walker (2 wheels);Ambulation Toilet  Transfer Details (indicate cue type and reason): EOB to toilet with RW Toileting- Clothing Manipulation and Hygiene: Contact guard assist;Sitting/lateral lean Toileting - Clothing Manipulation Details (indicate cue type and reason): Pt completed  peri-care without physical assist.     Functional mobility during ADLs: Contact guard assist;Rolling walker (2 wheels) General ADL Comments: Able to ambualte to the toilet, sink, and then to chair with RW and CGA.     Vision Baseline Vision/History: 1 Wears glasses Ability to See in Adequate Light: 1 Impaired Patient Visual Report: No change from baseline Vision Assessment?: No apparent visual deficits     Perception Perception: Not tested       Praxis Praxis: Not tested       Pertinent Vitals/Pain Pain Assessment Pain Assessment: No/denies pain     Extremity/Trunk Assessment Upper Extremity Assessment Upper Extremity Assessment: Generalized weakness (3-/5 bilateral shoulder flexion. WFL p/rom for shoudler flexion.)   Lower Extremity Assessment Lower Extremity Assessment: Defer to PT evaluation   Cervical / Trunk Assessment Cervical / Trunk Assessment: Kyphotic   Communication Communication Communication: No apparent difficulties   Cognition Arousal: Alert Behavior During Therapy: WFL for tasks assessed/performed Cognition: History of cognitive impairments                               Following commands: Intact       Cueing  General Comments   Cueing Techniques: Verbal cues;Tactile cues                 Home Living Family/patient expects to be discharged to:: Private residence Living Arrangements: Other relatives Available Help at Discharge: Family;Available 24 hours/day Type of Home: House Home Access: Level entry     Home Layout: One level     Bathroom Shower/Tub: Tub/shower unit;Walk-in shower   Bathroom Toilet: Standard Bathroom Accessibility: Yes   Home Equipment: Grab bars - tub/shower   Additional Comments: per chart      Prior Functioning/Environment Prior Level of Function : Needs assist       Physical Assist : Mobility (physical);ADLs (physical)   ADLs (physical): IADLs Mobility Comments: household  ambulation without AD ADLs Comments: Independent with most household, assisted by family for IADL's    OT Problem List: Decreased strength;Decreased range of motion;Decreased activity tolerance;Impaired balance (sitting and/or standing);Decreased cognition;Decreased safety awareness   OT Treatment/Interventions: Self-care/ADL training;Therapeutic exercise;DME and/or AE instruction;Therapeutic activities;Patient/family education;Balance training;Cognitive remediation/compensation      OT Goals(Current goals can be found in the care plan section)   Acute Rehab OT Goals Patient Stated Goal: improve function OT Goal Formulation: With patient Time For Goal Achievement: 08/28/24 Potential to Achieve Goals: Good   OT Frequency:  Min 3X/week                                   End of Session Equipment Utilized During Treatment: Rolling walker (2 wheels) Nurse Communication: Mobility status  Activity Tolerance: Patient tolerated treatment well Patient left: in chair;with call bell/phone within reach;with chair alarm set  OT Visit Diagnosis: Unsteadiness on feet (R26.81);Other abnormalities of gait and mobility (R26.89);Muscle weakness (generalized) (M62.81)                Time: 8665-8648 OT Time Calculation (min): 17 min Charges:  OT General Charges $OT Visit: 1 Visit OT Evaluation $OT Eval Low Complexity: 1 Low  Laronica Bhagat OT, MOT  Jayson Person 08/14/2024, 3:31 PM

## 2024-08-14 NOTE — TOC Progression Note (Signed)
 Transition of Care [facility]) - Progression Note    Patient Details  Name: Brittany Archer MRN: [id number] Date of Birth: Apr 15, 1941  [facility] CM/SW Contact  Lucie Lunger, [place] Phone Number: 08/14/2024, 11:23 AM  Clinical Narrative:    CSW met with pt at bedside to review bed offers for SNF placement. Pt states she would like to accept bed at [facility] so she can stay close to her family. CSW update HUB to reflect bed choice. CSW also reached out to pts sister Mae to update, no answer and VM not set up at this time. Insurance auth started for SNF placement. TOC to follow.   Expected Discharge Plan: Skilled Nursing Facility Barriers to Discharge: Continued Medical Work up               Expected Discharge Plan and Services In-house Referral: Clinical Social Work Discharge Planning Services: CM Consult   Living arrangements for the past 2 months: Single Family Home, Skilled Nursing Facility                                       Social Drivers of Health (SDOH) Interventions SDOH Screenings   Food Insecurity: No Food Insecurity (08/12/2024)  Housing: Low Risk  (08/12/2024)  Transportation Needs: No Transportation Needs (08/12/2024)  Utilities: Not At Risk (08/12/2024)  Alcohol Screen: Low Risk  (12/09/2020)  Depression (PHQ2-9): Low Risk  (07/27/2024)  Financial Resource Strain: Low Risk  (12/09/2020)  Physical Activity: Inactive (12/09/2020)  Social Connections: Moderately Isolated (08/12/2024)  Stress: No Stress Concern Present (12/09/2020)  Tobacco Use: Low Risk  (08/11/2024)    Readmission Risk Interventions     No data to display

## 2024-08-14 NOTE — Plan of Care (Signed)

## 2024-08-14 NOTE — Progress Notes (Signed)
 Mobility Specialist Progress Note:    08/14/24 1031  Mobility  Activity Stood at bedside;Pivoted/transferred to/from [facility];Pivoted/transferred from bed to chair;Ambulated with assistance  Level of Assistance Minimal assist, patient does 75% or more  Assistive Device BSC  Distance Ambulated (ft) 6 ft  Range of Motion/Exercises Active;All extremities  Activity Response Tolerated well  Mobility Referral Yes  Mobility visit 1 Mobility  Mobility Specialist Start Time (ACUTE ONLY) 1004  Mobility Specialist Stop Time (ACUTE ONLY) 1031  Mobility Specialist Time Calculation (min) (ACUTE ONLY) 27 min   Pt received in bed, agreeable to mobility. Required MinA to stand and ambulate using BSC and hand-held assistance. Tolerated well, asx throughout. Left in chair with NT, all needs met.   Sherrilee Ditty Mobility Specialist Please contact via Special educational needs teacher or  Rehab office at [phone]

## 2024-08-14 NOTE — Plan of Care (Signed)
  Problem: Acute Rehab OT Goals (only OT should resolve) Goal: Pt. Will Perform Grooming Flowsheets (Taken 08/14/2024 1533) Pt Will Perform Grooming:  with modified independence  standing Goal: Pt. Will Perform Lower Body Bathing Flowsheets (Taken 08/14/2024 1533) Pt Will Perform Lower Body Bathing:  with modified independence  sitting/lateral leans Goal: Pt. Will Perform Lower Body Dressing Flowsheets (Taken 08/14/2024 1533) Pt Will Perform Lower Body Dressing:  with modified independence  sitting/lateral leans Goal: Pt. Will Perform Toileting-Clothing Manipulation Flowsheets (Taken 08/14/2024 1533) Pt Will Perform Toileting - Clothing Manipulation and hygiene:  with modified independence  sitting/lateral leans Goal: Pt/Caregiver Will Perform Home Exercise Program Flowsheets (Taken 08/14/2024 1533) Pt/caregiver will Perform Home Exercise Program:  Increased ROM  Increased strength  Both right and left upper extremity  Independently  Prestyn Stanco OT, MOT

## 2024-08-14 NOTE — Progress Notes (Signed)
 PROGRESS NOTE   Brittany Archer  FMW:[id number] DOB: 12-Nov-1941 DOA: 08/11/2024 PCP: Katrinka Aquas, MD   Chief Complaint  Patient presents with   Back Pain   Level of care: Med-Surg  Brief Admission History:  83 year old female with past medical history significant for dementia, T2DM, CAD, multiple myeloma, bilateral breast cancer, CKD stage IIIb, GERD.  Patient was recently admitted 7/18 to 7/20 with complaints of back pain secondary to close compression fracture L2.  She was provided a TLSO brace, pain control to, and discharged.  Last night she went to bed, could not get up this morning secondary to increased back pain.  Visiting home nursing came by.  Patient sent to the ER.  Patient is incontinent of bowel and bladder but this is not new.   Pt was admitted for uncontrolled pain.     Assessment and Plan:  Uncontrolled back pain  --from compression fractures --agree with symptom management -- continue TLSO brace -- PT eval recommendation for SNF -- see orders for pain management   Hypomagnesemia -- IV replacement ordered -- repleted   Hypokalemia -- Mg has been repleted. -- replacement increased to KCl 40 meq BID   -- K being repleted   Type 2 DM controlled -- continue SSI coverage for now and CBG monitoring CBG (last 3)  Recent Labs    08/13/24 2200 08/14/24 0722 08/14/24 1117  GLUCAP 87 92 110*    CAD Hyperlipidemia -- restarted on home aspirin  and atorvastatin   Advanced dementia - resumed home memantine   GERD -- home pantoprazole  resumed   Left breast cancer Multiple Myeloma -- resumed home arimidex   DVT prophylaxis: sq heparin   Code Status: Full  Family Communication:  Disposition: DC SNF on 8/19   Consultants:   Procedures:   Antimicrobials:    Subjective: Pain seems better controlled today.     Objective: Vitals:   08/13/24 0448 08/13/24 1350 08/13/24 1949 08/14/24 0332  BP: (!) 159/61 (!) 144/64 (!) 140/54 (!) 160/78  Pulse: (!)  58 68 67 71  Resp: 16 16 18 19   Temp: 97.9 F (36.6 C) 97.8 F (36.6 C) 98.2 F (36.8 C) 98.6 F (37 C)  TempSrc: Oral  Oral Oral  SpO2: 97% 97% 99% 96%  Weight:      Height:        Intake/Output Summary (Last 24 hours) at 08/14/2024 1322 Last data filed at 08/14/2024 0908 Gross per 24 hour  Intake 0 ml  Output 500 ml  Net -500 ml   Filed Weights   08/11/24 1220 08/12/24 0125  Weight: 63.8 kg 64.5 kg   Examination:  General exam: Appears calm and comfortable. Pt wearing TLSO brace.   Respiratory system: Clear to auscultation. Respiratory effort normal. Cardiovascular system: normal S1 & S2 heard. No JVD, murmurs, rubs, gallops or clicks. No pedal edema. Gastrointestinal system: Abdomen is nondistended, soft and nontender. No organomegaly or masses felt. Normal bowel sounds heard. Central nervous system: Alert and disoriented. No focal neurological deficits. Extremities: Symmetric 5 x 5 power. Skin: No rashes, lesions or ulcers. Psychiatry: Judgement and insight appear diminished. Mood & affect appropriate.   Data Reviewed: I have personally reviewed following labs and imaging studies  CBC: Recent Labs  Lab 08/11/24 1458 08/12/24 0433  WBC 3.9* 2.9*  NEUTROABS 2.8  --   HGB 9.0* 9.8*  HCT 28.2* 30.6*  MCV 100.4* 100.3*  PLT 174 164    Basic Metabolic Panel: Recent Labs  Lab 08/11/24 1458 08/12/24 0433 08/13/24  0507 08/14/24 0811  NA 138 140 138 138  K 3.6 3.7 3.0* 3.1*  CL 106 105 103 104  CO2 22 25 25 25   GLUCOSE 88 77 100* 95  BUN 14 12 15 12   CREATININE 0.82 0.83 1.06* 0.88  CALCIUM  8.7* 8.9 8.8* 8.4*  MG 1.6* 1.7 2.5*  --     CBG: Recent Labs  Lab 08/13/24 1132 08/13/24 1626 08/13/24 2200 08/14/24 0722 08/14/24 1117  GLUCAP 124* 91 87 92 110*    No results found for this or any previous visit (from the past 240 hours).   Radiology Studies: No results found.   Scheduled Meds:  acetaminophen   650 mg Oral Q6H   anastrozole   1 mg  Oral Daily   aspirin  EC  81 mg Oral Daily   atorvastatin   10 mg Oral Daily   clotrimazole    Topical BID   heparin   5,000 Units Subcutaneous Q8H   insulin  aspart  0-15 Units Subcutaneous TID WC   insulin  aspart  0-5 Units Subcutaneous QHS   lenalidomide   10 mg Oral Daily   lidocaine   2 patch Transdermal Q24H   memantine   5 mg Oral Daily   mirtazapine   7.5 mg Oral QHS   pantoprazole   40 mg Oral Daily   potassium chloride   20 mEq Oral BID   senna  1 tablet Oral BID   Continuous Infusions:   LOS: 3 days   Time spent: 40 mins  Kayde Warehime Vicci, MD How to contact the [facility] Attending or Consulting provider 7A - 7P or covering provider during after hours 7P -7A, for this patient?  Check the care team in [facility] and look for a) attending/consulting TR[facility] provider listed and b) the TRH team listed Log into [email or web address] to find provider on call.  Locate the TRH provider you are looking for under [facility] and page to a number that you can be directly reached. If you still have difficulty reaching the provider, please page the [facility] (Director on Call) for the Hospitalists listed on amion for assistance.  08/14/2024, 1:22 PM

## 2024-08-15 DIAGNOSIS — M5489 Other dorsalgia: Secondary | ICD-10-CM | POA: Diagnosis not present

## 2024-08-15 DIAGNOSIS — C9000 Multiple myeloma not having achieved remission: Secondary | ICD-10-CM | POA: Diagnosis not present

## 2024-08-15 DIAGNOSIS — E11628 Type 2 diabetes mellitus with other skin complications: Secondary | ICD-10-CM | POA: Diagnosis not present

## 2024-08-15 DIAGNOSIS — R52 Pain, unspecified: Secondary | ICD-10-CM | POA: Diagnosis not present

## 2024-08-15 DIAGNOSIS — C50212 Malignant neoplasm of upper-inner quadrant of left female breast: Secondary | ICD-10-CM | POA: Diagnosis not present

## 2024-08-15 LAB — CBC WITH DIFFERENTIAL/PLATELET
Abs Immature Granulocytes: 0.01 K/uL (ref 0.00–0.07)
Basophils Absolute: 0 K/uL (ref 0.0–0.1)
Basophils Relative: 1 %
Eosinophils Absolute: 0.2 K/uL (ref 0.0–0.5)
Eosinophils Relative: 5 %
HCT: 30.2 % — ABNORMAL LOW (ref 36.0–46.0)
Hemoglobin: 9.7 g/dL — ABNORMAL LOW (ref 12.0–15.0)
Immature Granulocytes: 0 %
Lymphocytes Relative: 29 %
Lymphs Abs: 1 K/uL (ref 0.7–4.0)
MCH: 32.6 pg (ref 26.0–34.0)
MCHC: 32.1 g/dL (ref 30.0–36.0)
MCV: 101.3 fL — ABNORMAL HIGH (ref 80.0–100.0)
Monocytes Absolute: 0.2 K/uL (ref 0.1–1.0)
Monocytes Relative: 7 %
Neutro Abs: 1.9 K/uL (ref 1.7–7.7)
Neutrophils Relative %: 58 %
Platelets: 151 K/uL (ref 150–400)
RBC: 2.98 MIL/uL — ABNORMAL LOW (ref 3.87–5.11)
RDW: 18.1 % — ABNORMAL HIGH (ref 11.5–15.5)
WBC: 3.3 K/uL — ABNORMAL LOW (ref 4.0–10.5)
nRBC: 0 % (ref 0.0–0.2)

## 2024-08-15 LAB — GLUCOSE, CAPILLARY
Glucose-Capillary: 101 mg/dL — ABNORMAL HIGH (ref 70–99)
Glucose-Capillary: 182 mg/dL — ABNORMAL HIGH (ref 70–99)
Glucose-Capillary: 139 mg/dL — ABNORMAL HIGH (ref 70–99)
Glucose-Capillary: 125 mg/dL — ABNORMAL HIGH (ref 70–99)

## 2024-08-15 MED ORDER — LIDOCAINE 5 % EX PTCH: 2.0000 | MEDICATED_PATCH | CUTANEOUS | Status: DC

## 2024-08-15 MED ORDER — ACETAMINOPHEN 325 MG PO TABS: 650.0000 mg | ORAL_TABLET | Freq: Three times a day (TID) | ORAL | Status: DC

## 2024-08-15 MED ORDER — OXYCODONE HCL 5 MG PO TABS: 5.0000 mg | ORAL_TABLET | Freq: Four times a day (QID) | ORAL | 0 refills | Status: DC | PRN

## 2024-08-15 MED ORDER — CLOTRIMAZOLE 1 % EX CREA: TOPICAL_CREAM | Freq: Two times a day (BID) | CUTANEOUS | Status: AC

## 2024-08-15 MED ORDER — METFORMIN HCL ER 500 MG PO TB24: 500.0000 mg | ORAL_TABLET | Freq: Every day | ORAL | Status: DC

## 2024-08-15 MED ORDER — MAGNESIUM OXIDE -MG SUPPLEMENT 400 (240 MG) MG PO TABS
1.0000 | ORAL_TABLET | Freq: Every day | ORAL | Status: DC
Start: 2024-08-15 — End: 2024-08-18

## 2024-08-15 MED ORDER — SENNA 8.6 MG PO TABS
1.0000 | ORAL_TABLET | Freq: Every day | ORAL | Status: DC
Start: 2024-08-15 — End: 2024-10-26

## 2024-08-15 NOTE — Progress Notes (Signed)
 Mobility Specialist Progress Note:    08/15/24 1254  Mobility  Activity Pivoted/transferred from bed to chair;Ambulated with assistance  Level of Assistance Minimal assist, patient does 75% or more  Assistive Device Front wheel walker  Distance Ambulated (ft) 4 ft  Range of Motion/Exercises Active;All extremities  Activity Response Tolerated well  Mobility Referral Yes  Mobility visit 1 Mobility  Mobility Specialist Start Time (ACUTE ONLY) 1232  Mobility Specialist Stop Time (ACUTE ONLY) 1251  Mobility Specialist Time Calculation (min) (ACUTE ONLY) 19 min   Pt agreeable to mobility, required MinA to stand and ambulate with RW. Tolerated well, asx throughout. NT in room, all needs met.   Sherrilee Ditty Mobility Specialist Please contact via Special educational needs teacher or  Rehab office at [phone]

## 2024-08-15 NOTE — Progress Notes (Signed)
 Occupational Therapy Treatment Patient Details Name: Brittany Archer MRN: [id number] DOB: [birth year] Today's Date: 08/15/2024   History of present illness This is a 83 year old female with past medical history significant for dementia, T2DM, CAD, multiple myeloma, bilateral breast cancer, CKD stage IIIb, GERD.  Patient was recently admitted 7/18 to 7/20 with complaints of back pain secondary to close compression fracture L2.  She was provided a TLSO brace, pain control to, and discharged.  Last night she went to bed, could not get up this morning secondary to increased back pain.  Visiting home nursing came by.  Patient sent to the ER.  Patient is incontinent of bowel and bladder but this is not new.   OT comments  Pt agreeable to OT treatment. Pt able to functionally ambulate to the door with CGA using RW while donning TLSO. Pt also able to doff and don L LE sock while seated in the chair with feet elevated. Verbal and tactile cuing needed for pt to complete lower body dressing. Pt also tolerated B UE A/ROM/strengthening seated in the chair well. Pt left in the chair with call bell within reach. Pt will benefit from continued OT in the hospital and recommended venue below to increase strength, balance, and endurance for safe ADL's.       If plan is discharge home, recommend the following:  A little help with walking and/or transfers;A little help with bathing/dressing/bathroom;Assistance with cooking/housework;Assist for transportation;Help with stairs or ramp for entrance   Equipment Recommendations  None recommended by OT          Precautions / Restrictions Precautions Precautions: Fall Recall of Precautions/Restrictions: Impaired Required Braces or Orthoses: Spinal Brace Spinal Brace: Thoracolumbosacral orthotic Restrictions Weight Bearing Restrictions Per Provider Order: No       Mobility Bed Mobility               General bed mobility comments: Pt seated in the chair  when this therapist entered the room.    Transfers Overall transfer level: Needs assistance Equipment used: Rolling walker (2 wheels) Transfers: Sit to/from Stand Sit to Stand: Contact guard assist           General transfer comment: No physical assist to stand with RW from chair today. Pt ambulated in room after sit to stand.     Balance Overall balance assessment: Needs assistance Sitting-balance support: Bilateral upper extremity supported, Feet supported Sitting balance-Leahy Scale: Fair Sitting balance - Comments: seated in the chair   Standing balance support: Bilateral upper extremity supported, During functional activity, Reliant on assistive device for balance Standing balance-Leahy Scale: Fair Standing balance comment: using RW                           ADL either performed or assessed with clinical judgement   ADL Overall ADL's : Needs assistance/impaired                     Lower Body Dressing: Supervision/safety;Sitting/lateral leans Lower Body Dressing Details (indicate cue type and reason): Pt able to doff and don L LE sock seated in the chair with legs elevated; cuing and coaching needed for technique to bring L LE over the pt's R knee to try lower body dressing.             Functional mobility during ADLs: Contact guard assist;Rolling walker (2 wheels) General ADL Comments: Pt able to functionally ambulate to just outside the door for a  total distance of ~20 feet.     Communication Communication Communication: No apparent difficulties   Cognition Arousal: Alert Behavior During Therapy: WFL for tasks assessed/performed Cognition: History of cognitive impairments                               Following commands: Intact        Cueing   Cueing Techniques: Verbal cues, Tactile cues  Exercises Exercises: General Upper Extremity General Exercises - Upper Extremity Shoulder Flexion: AROM, Both, 10 reps,  Strengthening, Seated Shoulder ABduction: AROM, Both, 10 reps, Strengthening, Seated Shoulder Horizontal ABduction: AROM, Strengthening, Both, 10 reps, Seated (x10 external rotation and protraction as well)                 Pertinent Vitals/ Pain       Pain Assessment Pain Assessment: Faces Faces Pain Scale: Hurts a little bit Pain Location: R flank pain Pain Descriptors / Indicators: Discomfort Pain Intervention(s): Monitored during session, Repositioned                                                          Frequency  Min 3X/week        Progress Toward Goals  OT Goals(current goals can now be found in the care plan section)  Progress towards OT goals: Progressing toward goals  Acute Rehab OT Goals Patient Stated Goal: improve function OT Goal Formulation: With patient Time For Goal Achievement: 08/28/24 Potential to Achieve Goals: Good ADL Goals Pt Will Perform Grooming: with modified independence;standing Pt Will Perform Lower Body Bathing: with modified independence;sitting/lateral leans Pt Will Perform Lower Body Dressing: with modified independence;sitting/lateral leans Pt Will Perform Toileting - Clothing Manipulation and hygiene: with modified independence;sitting/lateral leans Pt/caregiver will Perform Home Exercise Program: Increased ROM;Increased strength;Both right and left upper extremity;Independently  Plan                                      End of Session Equipment Utilized During Treatment: Rolling walker (2 wheels)  OT Visit Diagnosis: Unsteadiness on feet (R26.81);Other abnormalities of gait and mobility (R26.89);Muscle weakness (generalized) (M62.81)   Activity Tolerance Patient tolerated treatment well   Patient Left in chair;with call bell/phone within reach   Nurse Communication          Time: 8666-8653 OT Time Calculation (min): 13 min  Charges: OT General Charges $OT Visit: 1 Visit OT  Treatments $Self Care/Home Management : 8-22 mins  Amair Shrout OT, MOT  Jayson Person 08/15/2024, 2:25 PM

## 2024-08-15 NOTE — TOC Progression Note (Signed)
 Transition of Care [facility]) - Progression Note    Patient Details  Name: Brittany Archer MRN: [id number] Date of Birth: 1941/07/31  Transition of Care [facility]) CM/SW Contact  Lucie Lunger, [place] Phone Number: 08/15/2024, 11:51 AM  Clinical Narrative:    CSW updated by MD that pt is requesting to go home to one of her sisters home instead of SNF. CSW met with pt at bedside to confirm her preference in this, pt states she would like to go back home if possible. CSW explained that I will need to speak with sisters to confirm they are agreeable to plan. CSW spoke with pts niece as unable to get either sister to answer and no VM box being set up, niece states she will request one of them return call to this CSW. Also, niece states pt has been living with sister [place]. TOC to follow.   Expected Discharge Plan: Skilled Nursing Facility Barriers to Discharge: Continued Medical Work up               Expected Discharge Plan and Services In-house Referral: Clinical Social Work Discharge Planning Services: CM Consult   Living arrangements for the past 2 months: Single Family Home, Skilled Nursing Facility Expected Discharge Date: 08/15/24                                     Social Drivers of Health (SDOH) Interventions SDOH Screenings   Food Insecurity: No Food Insecurity (08/12/2024)  Housing: Low Risk  (08/12/2024)  Transportation Needs: No Transportation Needs (08/12/2024)  Utilities: Not At Risk (08/12/2024)  Alcohol Screen: Low Risk  (12/09/2020)  Depression (PHQ2-9): Low Risk  (07/27/2024)  Financial Resource Strain: Low Risk  (12/09/2020)  Physical Activity: Inactive (12/09/2020)  Social Connections: Moderately Isolated (08/12/2024)  Stress: No Stress Concern Present (12/09/2020)  Tobacco Use: Low Risk  (08/11/2024)    Readmission Risk Interventions     No data to display

## 2024-08-15 NOTE — Care Management CC44 (Signed)
 Condition Code 44 Documentation Completed  Patient Details  Name: Brittany Archer MRN: [id number] Date of Birth: [birth year]   Condition Code 44 given:  Yes Patient signature on Condition Code 44 notice:  Yes Documentation of 2 MD's agreement:  Yes Code 44 added to claim:  Yes    Lucie Lunger, LCSWA 08/15/2024, 11:46 AM

## 2024-08-15 NOTE — Plan of Care (Signed)

## 2024-08-15 NOTE — Care Management Important Message (Deleted)
 Important Message  Patient Details  Name: Brittany Archer MRN: [id number] Date of Birth: [birth year]   Important Message Given:  Yes - Medicare IM     Renzo Vincelette L Kayda Allers 08/15/2024, 9:10 AM

## 2024-08-15 NOTE — TOC Progression Note (Deleted)
 Transition of Care [facility]) - Progression Note    Patient Details  Name: Brittany Archer MRN: [id number] Date of Birth: [birth year]  Transition of Care [facility]) CM/SW Contact  Lucie Lunger, [place] Phone Number: 08/15/2024, 3:28 PM  Clinical Narrative:    CSW spoke with pts sister [place] who pt lives with, she states that she is not able to offer the needed help that pt will need and she feels pt would benefit most from SNF placement. CSW explained that [facility] does not have any beds at this time due to plumbing issues but they can offer her a bed at [facility] until a bed opens at [facility] and they will move her back, she is agreeable. CSW met with pt at bedside to review, she is agreeable with this plan. CSW to update insurance auth on new bed choice. Pt will be able to D/C tomorrow. TOC to follow.   Expected Discharge Plan: Skilled Nursing Facility Barriers to Discharge: Continued Medical Work up               Expected Discharge Plan and Services In-house Referral: Clinical Social Work Discharge Planning Services: CM Consult   Living arrangements for the past 2 months: Single Family Home, Skilled Nursing Facility Expected Discharge Date: 08/15/24                                     Social Drivers of Health (SDOH) Interventions SDOH Screenings   Food Insecurity: No Food Insecurity (08/12/2024)  Housing: Low Risk  (08/12/2024)  Transportation Needs: No Transportation Needs (08/12/2024)  Utilities: Not At Risk (08/12/2024)  Alcohol Screen: Low Risk  (12/09/2020)  Depression (PHQ2-9): Low Risk  (07/27/2024)  Financial Resource Strain: Low Risk  (12/09/2020)  Physical Activity: Inactive (12/09/2020)  Social Connections: Moderately Isolated (08/12/2024)  Stress: No Stress Concern Present (12/09/2020)  Tobacco Use: Low Risk  (08/11/2024)    Readmission Risk Interventions     No data to display

## 2024-08-15 NOTE — Progress Notes (Signed)
 Patient awaiting transport to [facility], report has been called

## 2024-08-15 NOTE — Discharge Instructions (Signed)
°  IMPORTANT INFORMATION: PAY CLOSE ATTENTION  ° °PHYSICIAN DISCHARGE INSTRUCTIONS ° °Follow with Primary care provider  Hunter, Denise, MD  and other consultants as instructed by your Hospitalist Physician ° °SEEK MEDICAL CARE OR RETURN TO EMERGENCY ROOM IF SYMPTOMS COME BACK, WORSEN OR NEW PROBLEM DEVELOPS  ° °Please note: °You were cared for by a hospitalist during your hospital stay. Every effort will be made to forward records to your primary care provider.  You can request that your primary care provider send for your hospital records if they have not received them.  Once you are discharged, your primary care physician will handle any further medical issues. Please note that NO REFILLS for any discharge medications will be authorized once you are discharged, as it is imperative that you return to your primary care physician (or establish a relationship with a primary care physician if you do not have one) for your post hospital discharge needs so that they can reassess your need for medications and monitor your lab values. ° °Please get a complete blood count and chemistry panel checked by your Primary MD at your next visit, and again as instructed by your Primary MD. ° °Get Medicines reviewed and adjusted: °Please take all your medications with you for your next visit with your Primary MD ° °Laboratory/radiological data: °Please request your Primary MD to go over all hospital tests and procedure/radiological results at the follow up, please ask your primary care provider to get all Hospital records sent to his/her office. ° °In some cases, they will be blood work, cultures and biopsy results pending at the time of your discharge. Please request that your primary care provider follow up on these results. ° °If you are diabetic, please bring your blood sugar readings with you to your follow up appointment with primary care.   ° °Please call and make your follow up appointments as soon as possible.   ° °Also Note  the following: °If you experience worsening of your admission symptoms, develop shortness of breath, life threatening emergency, suicidal or homicidal thoughts you must seek medical attention immediately by calling 911 or calling your MD immediately  if symptoms less severe. ° °You must read complete instructions/literature along with all the possible adverse reactions/side effects for all the Medicines you take and that have been prescribed to you. Take any new Medicines after you have completely understood and accpet all the possible adverse reactions/side effects.  ° °Do not drive when taking Pain medications or sleeping medications (Benzodiazepines) ° °Do not take more than prescribed Pain, Sleep and Anxiety Medications. It is not advisable to combine anxiety,sleep and pain medications without talking with your primary care practitioner ° °Special Instructions: If you have smoked or chewed Tobacco  in the last 2 yrs please stop smoking, stop any regular Alcohol  and or any Recreational drug use. ° °Wear Seat belts while driving.  Do not drive if taking any narcotic, mind altering or controlled substances or recreational drugs or alcohol.  ° ° ° ° ° ° °

## 2024-08-15 NOTE — Progress Notes (Signed)
 Mobility Specialist Progress Note:    08/15/24 1000  Mobility  Activity Refused and notified nurse if applicable   Pt kindly refused, stated I don't feel like doing nothing. All needs met.   Sherrilee Ditty Mobility Specialist Please contact via Special educational needs teacher or  Rehab office at [phone]

## 2024-08-15 NOTE — Discharge Summary (Signed)
 Physician Discharge Summary  Sumi Lye [facility] FMW:[id number] DOB: 08-Jan-1941 DOA: 08/11/2024  PCP: Katrinka Aquas, MD  Admit date: 08/11/2024 Discharge date: 08/15/2024  Admitted From:  HOME  Disposition:  SNF   Recommendations for Outpatient Follow-up:  Follow up with PCP in 1 weeks Please monitor blood sugars per facility protocol  Recommend outpatient palliative medicine consult  Home Health: SNF  Discharge Condition: STABLE   CODE STATUS: FULL DIET: Dysphagia 3    Brief Hospitalization Summary: Please see all hospital notes, images, labs for full details of the hospitalization. 83 year old female with past medical history significant for dementia, T2DM, CAD, multiple myeloma, bilateral breast cancer, CKD stage IIIb, GERD.  Patient was recently admitted 7/18 to 7/20 with complaints of back pain secondary to close compression fracture L2.  She was provided a TLSO brace, pain control to, and discharged.  Last night she went to bed, could not get up this morning secondary to increased back pain.  Visiting home nursing came by.  Patient sent to the ER.  Patient is incontinent of bowel and bladder but this is not new.   Pt was admitted for uncontrolled pain.    Hospital course by listed problems addressed  Uncontrolled back pain - resolved  --from compression fractures --agree with symptom management as ordered  -- continue TLSO brace -- PT eval recommendation for SNF -- see orders for pain management    Hypomagnesemia -- IV replacement ordered -- repleted    Hypokalemia -- Mg has been repleted. -- replacement increased to KCl 40 meq BID   -- K being repleted    Type 2 DM controlled -- continue SSI coverage for now and CBG monitoring CBG (last 3)  Recent Labs (last 2 labs)       Recent Labs    08/13/24 2200 08/14/24 0722 08/14/24 1117  GLUCAP 87 92 110*       CAD Hyperlipidemia -- restarted on home aspirin  and atorvastatin    Advanced dementia - resumed home  memantine    GERD -- home pantoprazole  resumed    Left breast cancer Multiple Myeloma -- resumed home arimidex  -- follow up with [facility]   Discharge Diagnoses:  Principal Problem:   Intractable pain Active Problems:   Multiple myeloma not having achieved remission (HCC)   Type 2 diabetes mellitus (HCC)   Hypomagnesemia   CKD (chronic kidney disease), stage III B   Dementia without behavioral disturbance (HCC)   Breast cancer of upper-inner quadrant of left female breast (HCC)   GERD (gastroesophageal reflux disease)   Discharge Instructions:  Allergies as of 08/15/2024       Reactions   Motrin [ibuprofen] Rash   Seasonal Ic [cholestatin] Other (See Comments)   Sneezing, watery eyes        Medication List     STOP taking these medications    metFORMIN  500 MG tablet Commonly known as: GLUCOPHAGE  Replaced by: metFORMIN  500 MG 24 hr tablet   traMADol  50 MG tablet Commonly known as: ULTRAM        TAKE these medications    Accu-Chek Aviva Plus test strip Generic drug: glucose blood CHECK BLOOD SUGAR ONCE DAILY   acetaminophen  325 MG tablet Commonly known as: TYLENOL  Take 2 tablets (650 mg total) by mouth in the morning, at noon, and at bedtime. What changed:  medication strength how much to take when to take this   anastrozole  1 MG tablet Commonly known as: ARIMIDEX  Take 1 tablet (1 mg total) by mouth daily.  Aspirin  Low Dose 81 MG tablet Generic drug: aspirin  EC Take 81 mg by mouth daily.   atorvastatin  10 MG tablet Commonly known as: LIPITOR Take 10 mg by mouth daily.   bortezomib  IV 3.5 MG injection Commonly known as: VELCADE  Inject 3.5 mg into the vein every Thursday.   clotrimazole  1 % cream Commonly known as: LOTRIMIN  Apply topically 2 (two) times daily for 7 days. Apply to affected groin area   famotidine  20 MG tablet Commonly known as: PEPCID  Take 20 mg by mouth at bedtime as needed for heartburn or indigestion.    lenalidomide  10 MG capsule Commonly known as: REVLIMID  Take 1 capsule (10 mg total) by mouth daily. Take daily for 21 days, with 7 days off   lidocaine  5 % Commonly known as: LIDODERM  Place 2 patches onto the skin daily. Remove & Discard patch within 12 hours or as directed by MD   magnesium  oxide 400 (240 Mg) MG tablet Commonly known as: MAG-OX Take 1 tablet (400 mg total) by mouth daily. What changed: when to take this   memantine  5 MG tablet Commonly known as: NAMENDA  Take 5 mg by mouth daily.   metFORMIN  500 MG 24 hr tablet Commonly known as: GLUCOPHAGE -XR Take 1 tablet (500 mg total) by mouth daily with supper. Replaces: metFORMIN  500 MG tablet   mirtazapine  7.5 MG tablet Commonly known as: REMERON  Take 7.5 mg by mouth at bedtime.   oxyCODONE  5 MG immediate release tablet Commonly known as: Oxy IR/ROXICODONE  Take 1 tablet (5 mg total) by mouth every 6 (six) hours as needed for severe pain (pain score 7-10).   pantoprazole  40 MG tablet Commonly known as: PROTONIX  Take 1 tablet (40 mg total) by mouth daily.   potassium chloride  20 MEQ packet Commonly known as: KLOR-CON  Take 20 mEq by mouth 2 (two) times daily.   senna 8.6 MG Tabs tablet Commonly known as: SENOKOT Take 1 tablet (8.6 mg total) by mouth at bedtime.        Contact information for follow-up providers     Katrinka Aquas, MD. Schedule an appointment as soon as possible for a visit in 1 week(s).   Specialty: Internal Medicine Why: Hospital Follow Up Contact information: [address] [place] [address] [phone]              Contact information for after-discharge care     Destination     [facility] .   Service: Skilled Nursing Contact information: [address] [place]  [address] [phone]                    Allergies  Allergen Reactions   Motrin [Ibuprofen] Rash   Seasonal Ic [Cholestatin]  Other (See Comments)    Sneezing, watery eyes   Allergies as of 08/15/2024       Reactions   Motrin [ibuprofen] Rash   Seasonal Ic [cholestatin] Other (See Comments)   Sneezing, watery eyes        Medication List     STOP taking these medications    metFORMIN  500 MG tablet Commonly known as: GLUCOPHAGE  Replaced by: metFORMIN  500 MG 24 hr tablet   traMADol  50 MG tablet Commonly known as: ULTRAM        TAKE these medications    Accu-Chek Aviva Plus test strip Generic drug: glucose blood CHECK BLOOD SUGAR ONCE DAILY   acetaminophen  325 MG tablet Commonly known as: TYLENOL  Take 2 tablets (650 mg  total) by mouth in the morning, at noon, and at bedtime. What changed:  medication strength how much to take when to take this   anastrozole  1 MG tablet Commonly known as: ARIMIDEX  Take 1 tablet (1 mg total) by mouth daily.   Aspirin  Low Dose 81 MG tablet Generic drug: aspirin  EC Take 81 mg by mouth daily.   atorvastatin  10 MG tablet Commonly known as: LIPITOR Take 10 mg by mouth daily.   bortezomib  IV 3.5 MG injection Commonly known as: VELCADE  Inject 3.5 mg into the vein every Thursday.   clotrimazole  1 % cream Commonly known as: LOTRIMIN  Apply topically 2 (two) times daily for 7 days. Apply to affected groin area   famotidine  20 MG tablet Commonly known as: PEPCID  Take 20 mg by mouth at bedtime as needed for heartburn or indigestion.   lenalidomide  10 MG capsule Commonly known as: REVLIMID  Take 1 capsule (10 mg total) by mouth daily. Take daily for 21 days, with 7 days off   lidocaine  5 % Commonly known as: LIDODERM  Place 2 patches onto the skin daily. Remove & Discard patch within 12 hours or as directed by MD   magnesium  oxide 400 (240 Mg) MG tablet Commonly known as: MAG-OX Take 1 tablet (400 mg total) by mouth daily. What changed: when to take this   memantine  5 MG tablet Commonly known as: NAMENDA  Take 5 mg by mouth daily.   metFORMIN  500 MG  24 hr tablet Commonly known as: GLUCOPHAGE -XR Take 1 tablet (500 mg total) by mouth daily with supper. Replaces: metFORMIN  500 MG tablet   mirtazapine  7.5 MG tablet Commonly known as: REMERON  Take 7.5 mg by mouth at bedtime.   oxyCODONE  5 MG immediate release tablet Commonly known as: Oxy IR/ROXICODONE  Take 1 tablet (5 mg total) by mouth every 6 (six) hours as needed for severe pain (pain score 7-10).   pantoprazole  40 MG tablet Commonly known as: PROTONIX  Take 1 tablet (40 mg total) by mouth daily.   potassium chloride  20 MEQ packet Commonly known as: KLOR-CON  Take 20 mEq by mouth 2 (two) times daily.   senna 8.6 MG Tabs tablet Commonly known as: SENOKOT Take 1 tablet (8.6 mg total) by mouth at bedtime.        Procedures/Studies: CT L-SPINE NO CHARGE Result Date: 08/11/2024 CLINICAL DATA:  back pain per pt rt side. Pt has hx of leukemia, compression fx and has a back brace at this time. Pt is from home. Pt states back pain not that bad. EXAM: CT LUMBAR SPINE WITHOUT CONTRAST TECHNIQUE: Multidetector CT imaging of the lumbar spine was performed without intravenous contrast administration. Multiplanar CT image reconstructions were also generated. RADIATION DOSE REDUCTION: This exam was performed according to the departmental dose-optimization program which includes automated exposure control, adjustment of the mA and/or kV according to patient size and/or use of iterative reconstruction technique. COMPARISON:  CT lumbar spine 05/27/2024, MRI lumbar spine 07/14/2024 FINDINGS: Segmentation: 5 lumbar type vertebrae. Alignment: Normal. Vertebrae: Interval development of age-indeterminate L1 superior endplate compression fracture with at least 20% vertebral body height loss. Stable to slightly worsened L5 compression fracture with at least 35% vertebral body height loss. Chronic stable L2 compression fracture with similar-appearing 70% vertebral body height loss centrally. Chronic stable L3  compression fracture with at least 35% vertebral body height loss. No focal pathologic process. Paraspinal and other soft tissues: Negative. Disc levels: Maintained. IMPRESSION: 1. Interval development of age-indeterminate L1 superior endplate compression fracture with at least 20% vertebral  body height loss. Correlate with point tenderness to palpation to evaluate for an acute component. 2. Stable to slightly worsened L5 compression fracture with at least 35% vertebral body height loss. Correlate with point tenderness to palpation to evaluate for an acute component. 3. Stable L2 and L3 compression fractures. Electronically Signed   By: Morgane  Naveau M.D.   On: 08/11/2024 17:04   CT Renal Stone Study Result Date: 08/11/2024 CLINICAL DATA:  Abdominal/flank pain, stone suspected Pt has hx of leukemia, compression fx and has a back brace at this time. Pt is from home. Pt states back pain not that bad. EXAM: CT ABDOMEN AND PELVIS WITHOUT CONTRAST TECHNIQUE: Multidetector CT imaging of the abdomen and pelvis was performed following the standard protocol without IV contrast. RADIATION DOSE REDUCTION: This exam was performed according to the departmental dose-optimization program which includes automated exposure control, adjustment of the mA and/or kV according to patient size and/or use of iterative reconstruction technique. COMPARISON:  None Available. FINDINGS: Lower chest: Left lower lobe dependent atelectasis. Small hiatal hernia. Hepatobiliary: No focal liver abnormality. No gallstones, gallbladder wall thickening, or pericholecystic fluid. No biliary dilatation. Pancreas: No focal lesion. Normal pancreatic contour. No surrounding inflammatory changes. No main pancreatic ductal dilatation. Spleen: Normal in size without focal abnormality. Adrenals/Urinary Tract: No adrenal nodule bilaterally. No nephrolithiasis and no hydronephrosis. Fluid dense lesion of the left kidney likely represents a simple renal cyst.  Simple renal cysts, in the absence of clinically indicated signs/symptoms, require no independent follow-up. No ureterolithiasis or hydroureter. The urinary bladder is unremarkable. Stomach/Bowel: Stomach is within normal limits. No evidence of bowel wall thickening or dilatation. Colonic diverticulosis. The appendix is not definitely identified with no inflammatory changes in the right lower quadrant to suggest acute appendicitis. Vascular/Lymphatic: No abdominal aorta or iliac aneurysm. Severe atherosclerotic plaque of the aorta and its branches. No abdominal, pelvic, or inguinal lymphadenopathy. Reproductive: Status post hysterectomy. No adnexal masses. Other: No intraperitoneal free fluid. No intraperitoneal free gas. No organized fluid collection. Musculoskeletal: No abdominal wall hernia or abnormality. No suspicious lytic or blastic osseous lesions. No acute displaced fracture. Please see separately dictated CT lumbar spine 08/11/2024. IMPRESSION: 1. No acute intra-abdominal or intrapelvic abnormality with limited evaluation on this noncontrast study. 2. Small hiatal hernia. 3. Colonic diverticulosis with no acute diverticulitis. 4.  Aortic Atherosclerosis (ICD10-I70.0). 5. Please see separately dictated CT lumbar spine 08/11/2024. Electronically Signed   By: Morgane  Naveau M.D.   On: 08/11/2024 16:58     Subjective: Pt agreeable to SNF, says pain is controlled now.    Discharge Exam: Vitals:   08/14/24 2128 08/15/24 0544  BP: (!) 160/87 (!) 170/68  Pulse: (!) 58 65  Resp: 19 18  Temp: 98.6 F (37 C) 98.5 F (36.9 C)  SpO2: 99% 97%   Vitals:   08/13/24 1949 08/14/24 0332 08/14/24 2128 08/15/24 0544  BP: (!) 140/54 (!) 160/78 (!) 160/87 (!) 170/68  Pulse: 67 71 (!) 58 65  Resp: 18 19 19 18   Temp: 98.2 F (36.8 C) 98.6 F (37 C) 98.6 F (37 C) 98.5 F (36.9 C)  TempSrc: Oral Oral Oral Oral  SpO2: 99% 96% 99% 97%  Weight:      Height:        General exam: Appears calm and  comfortable. Pt wearing TLSO brace.   Respiratory system: Clear to auscultation. Respiratory effort normal. Cardiovascular system: normal S1 & S2 heard. No JVD, murmurs, rubs, gallops or clicks. No pedal edema. Gastrointestinal system:  Abdomen is nondistended, soft and nontender. No organomegaly or masses felt. Normal bowel sounds heard. Central nervous system: Alert and disoriented. No focal neurological deficits. Extremities: Symmetric 5 x 5 power. Skin: No rashes, lesions or ulcers. Psychiatry: Judgement and insight appear diminished. Mood & affect appropriate.    The results of significant diagnostics from this hospitalization (including imaging, microbiology, ancillary and laboratory) are listed below for reference.     Microbiology: No results found for this or any previous visit (from the past 240 hours).   Labs: BNP (last 3 results) No results for input(s): BNP in the last 8760 hours. Basic Metabolic Panel: Recent Labs  Lab 08/11/24 1458 08/12/24 0433 08/13/24 0507 08/14/24 0811  NA 138 140 138 138  K 3.6 3.7 3.0* 3.1*  CL 106 105 103 104  CO2 22 25 25 25   GLUCOSE 88 77 100* 95  BUN 14 12 15 12   CREATININE 0.82 0.83 1.06* 0.88  CALCIUM  8.7* 8.9 8.8* 8.4*  MG 1.6* 1.7 2.5*  --    Liver Function Tests: No results for input(s): AST, ALT, ALKPHOS, BILITOT, PROT, ALBUMIN in the last 168 hours. No results for input(s): LIPASE, AMYLASE in the last 168 hours. No results for input(s): AMMONIA in the last 168 hours. CBC: Recent Labs  Lab 08/11/24 1458 08/12/24 0433 08/15/24 0901  WBC 3.9* 2.9* 3.3*  NEUTROABS 2.8  --  1.9  HGB 9.0* 9.8* 9.7*  HCT 28.2* 30.6* 30.2*  MCV 100.4* 100.3* 101.3*  PLT 174 164 151   Cardiac Enzymes: No results for input(s): CKTOTAL, CKMB, CKMBINDEX, TROPONINI in the last 168 hours. BNP: Invalid input(s): POCBNP CBG: Recent Labs  Lab 08/14/24 0722 08/14/24 1117 08/14/24 1612 08/14/24 2131 08/15/24 0735   GLUCAP 92 110* 132* 134* 101*   D-Dimer No results for input(s): DDIMER in the last 72 hours. Hgb A1c No results for input(s): HGBA1C in the last 72 hours. Lipid Profile No results for input(s): CHOL, HDL, LDLCALC, TRIG, CHOLHDL, LDLDIRECT in the last 72 hours. Thyroid function studies No results for input(s): TSH, T4TOTAL, T3FREE, THYROIDAB in the last 72 hours.  Invalid input(s): FREET3 Anemia work up No results for input(s): VITAMINB12, FOLATE, FERRITIN, TIBC, IRON, RETICCTPCT in the last 72 hours. Urinalysis    Component Value Date/Time   COLORURINE YELLOW 08/11/2024 2000   APPEARANCEUR CLEAR 08/11/2024 2000   LABSPEC 1.009 08/11/2024 2000   PHURINE 5.0 08/11/2024 2000   GLUCOSEU NEGATIVE 08/11/2024 2000   HGBUR MODERATE (A) 08/11/2024 2000   BILIRUBINUR NEGATIVE 08/11/2024 2000   KETONESUR NEGATIVE 08/11/2024 2000   PROTEINUR NEGATIVE 08/11/2024 2000   UROBILINOGEN 0.2 09/11/2011 0005   NITRITE NEGATIVE 08/11/2024 2000   LEUKOCYTESUR NEGATIVE 08/11/2024 2000   Sepsis Labs Recent Labs  Lab 08/11/24 1458 08/12/24 0433 08/15/24 0901  WBC 3.9* 2.9* 3.3*   Microbiology No results found for this or any previous visit (from the past 240 hours).  Time coordinating discharge: 33 mins  SIGNED:  Afton Louder, MD  [facility] 08/15/2024, 9:19 AM How to contact the [facility] Attending or Consulting provider 7A - 7P or covering provider during after hours 7P -7A, for this patient?  Check the care team in [facility] and look for a) attending/consulting [facility] provider listed and b) the TR[facility] team listed Log into [email or web address] and use Agency's universal password to access. If you do not have the password, please contact the hospital operator. Locate the [facility] provider you are looking for under [facility] and page to a  number that you can be directly reached. If you still have difficulty reaching the provider, please page the [facility]  (Director on Call) for the Hospitalists listed on amion for assistance.

## 2024-08-15 NOTE — Care Management Obs Status (Signed)
 MEDICARE OBSERVATION STATUS NOTIFICATION   Patient Details  Name: Brittany Archer MRN: [id number] Date of Birth: 26-Apr-1941   Medicare Observation Status Notification Given:  Yes    Lucie Lunger, LCSWA 08/15/2024, 11:46 AM

## 2024-08-15 NOTE — TOC Transition Note (Signed)
 Transition of Care [facility]) - Discharge Note   Patient Details  Name: Brittany Archer MRN: [id number] Date of Birth: [birth year]  [facility] CM/SW Contact:  Lucie Lunger, LCSWA Phone Number: 08/15/2024, 3:34 PM   Clinical Narrative:    CSW spoke with pts sister [place] who pt lives with, she states that she is not able to offer the needed help that pt will need and she feels pt would benefit most from SNF placement. CSW explained that [facility] does not have any beds at this time due to plumbing issues but they can offer her a bed at [facility] until a bed opens at [facility] and they will move her back, she is agreeable. CSW met with pt at bedside to review, she is agreeable with this plan. Insurance auth updated on bed change. CSW spoke to [place] with [facility] who states pt can arrive today. RN provided with report number. Med necessity to be printed to the floor. EMS to be called for transport. TOC signing off.   Final next level of care: Skilled Nursing Facility Barriers to Discharge: Barriers Resolved   Patient Goals and CMS Choice Patient states their goals for this hospitalization and ongoing recovery are:: go to SNF CMS Medicare.gov Compare Post Acute Care list provided to:: Patient Choice offered to / list presented to : Patient, Sibling      Discharge Placement                Patient to be transferred to facility by: EMS Name of family member notified: sister [place] Patient and family notified of of transfer: 08/15/24  Discharge Plan and Services Additional resources added to the After Visit Summary for   In-house Referral: Clinical Social Work Discharge Planning Services: CM Consult                                 Social Drivers of Health (SDOH) Interventions SDOH Screenings   Food Insecurity: No Food Insecurity (08/12/2024)  Housing: Low Risk  (08/12/2024)  Transportation Needs: No Transportation Needs (08/12/2024)  Utilities: Not  At Risk (08/12/2024)  Alcohol Screen: Low Risk  (12/09/2020)  Depression (PHQ2-9): Low Risk  (07/27/2024)  Financial Resource Strain: Low Risk  (12/09/2020)  Physical Activity: Inactive (12/09/2020)  Social Connections: Moderately Isolated (08/12/2024)  Stress: No Stress Concern Present (12/09/2020)  Tobacco Use: Low Risk  (08/11/2024)     Readmission Risk Interventions     No data to display

## 2024-08-16 DIAGNOSIS — R52 Pain, unspecified: Secondary | ICD-10-CM | POA: Diagnosis not present

## 2024-08-16 DIAGNOSIS — S32000A Wedge compression fracture of unspecified lumbar vertebra, initial encounter for closed fracture: Secondary | ICD-10-CM

## 2024-08-16 DIAGNOSIS — M5489 Other dorsalgia: Secondary | ICD-10-CM | POA: Diagnosis not present

## 2024-08-16 LAB — BASIC METABOLIC PANEL WITH GFR
Anion gap: 10 (ref 5–15)
BUN: 14 mg/dL (ref 8–23)
CO2: 24 mmol/L (ref 22–32)
Calcium: 8.5 mg/dL — ABNORMAL LOW (ref 8.9–10.3)
Chloride: 105 mmol/L (ref 98–111)
Creatinine, Ser: 1.08 mg/dL — ABNORMAL HIGH (ref 0.44–1.00)
GFR, Estimated: 51 mL/min — ABNORMAL LOW (ref >60)
Glucose, Bld: 93 mg/dL (ref 70–99)
Potassium: 3.6 mmol/L (ref 3.5–5.1)
Sodium: 139 mmol/L (ref 135–145)

## 2024-08-16 LAB — CBC
HCT: 29.3 % — ABNORMAL LOW (ref 36.0–46.0)
Hemoglobin: 9.2 g/dL — ABNORMAL LOW (ref 12.0–15.0)
MCH: 31.8 pg (ref 26.0–34.0)
MCHC: 31.4 g/dL (ref 30.0–36.0)
MCV: 101.4 fL — ABNORMAL HIGH (ref 80.0–100.0)
Platelets: 168 K/uL (ref 150–400)
RBC: 2.89 MIL/uL — ABNORMAL LOW (ref 3.87–5.11)
RDW: 18.1 % — ABNORMAL HIGH (ref 11.5–15.5)
WBC: 3.9 K/uL — ABNORMAL LOW (ref 4.0–10.5)
nRBC: 0 % (ref 0.0–0.2)

## 2024-08-16 LAB — GLUCOSE, CAPILLARY: Glucose-Capillary: 83 mg/dL (ref 70–99)

## 2024-08-16 NOTE — Progress Notes (Signed)
 [facility] Progress Note  As per chart review, patient was supposed to discharge to SNF on 8/19.  DC summary and DC order was in place.  However due to transportation issues, patient was unable to leave on 8/19.  I reviewed labs early this morning, noted hypokalemia of 3.1 and bicytopenia on 8/18 and 8/19 respectively.  Before seeing the patient, I ordered stat CBC and BMP.  However on reaching the floor, transport was already at bedside ready to take patient to SNF  Subjective: Interviewed and examined patient.  Speech difficult to understand but as per nurses interpretation, patient really not keen on going to SNF, reluctantly agreeable.  No other acute issues reported  Objective: Elderly female, moderately built, thinly nourished lying comfortably supine in bed without distress.  Oral mucosa moist. Vitals:   08/15/24 1658 08/15/24 2014  BP: (!) 158/67 (!) 167/61  Pulse: 67 77  Resp: 18 20  Temp: 97.6 F (36.4 C) 98.3 F (36.8 C)  SpO2: 99% 97%   Respiratory system: Clear to auscultation.  No increased work of breathing Cardiovascular system: S1 and S2 heard, RRR.  No JVD, murmurs or pedal edema.  Not on telemetry. Abdomen: Nondistended, soft and nontender.  No organomegaly or masses appreciated.  Normal bowel sounds heard. CNS: Alert and oriented to person and place.  No focal neurological deficits Extremities: Symmetrically moving all extremities Musculoskeletal: Had TLSO brace.  Labwork reviewed: BMP unremarkable.  Hypokalemia has been corrected Hemoglobin 9.7 > 9.2.  WBC 3.3 > 3.9   Assessment and plan: 83 year old female with PMH of dementia, type II DM, CAD, multiple myeloma, breast cancer, CKD stage IIIb, GERD, recent hospitalization 7/18 - 7/24 back pain due to closed compression fracture of L2, presented to ED on 8/15 with complaints of inability to get up from bed due to increased back pain, seen by visiting home nurse and sent to ED for evaluation.  Uncontrolled back  pain Lumbar compression fractures CT L-spine results appreciated: Interval development of age-indeterminate L1 superior endplate compression fracture with at least 20% vertebral body height loss.  Stable to slightly worsened L5 compression fracture with at least 35% vertebral body height loss.  Stable L2 and L3 compression fractures. Appears to be controlled on scheduled Tylenol , Lidoderm  patch and infrequent doses of Oxy IR 5 mg. Therapies evaluated and recommended SNF. 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 If has recurrent or uncontrolled pain at SNF, could consider outpatient neurosurgery or IR consultation for potential intervention.  Also outpatient oncology follow-up with Dr. Rogers to see if any of these are related to her multiple myeloma. Recommend checking vitamin D levels and bone density as outpatient.  Hypomagnesemia/hypokalemia Replaced  Bicytopenia (leukopenia and anemia) May be related to multiple myeloma, related treatment, anemia of chronic disease etc. Outpatient follow-up with primary oncologist  Uncontrolled type II DM A1c 7.4 on 6/1 Good inpatient control on SSI Continue metformin .  Given her advanced age, target A1c probably should be closer to 7.  Outpatient follow-up.  CAD Hyperlipidemia Continue aspirin  and atorvastatin .  No chest pain reported  Advanced dementia Continue home dose of memantine   GERD Continue famotidine  and Protonix .  Left breast cancer Multiple myeloma Continue Arimidex  and outpatient follow-up with oncology.  Appears to be on Revlimid  and Velcade .  Stable for DC to SNF.  Discussed with patient's RN.  Trenda Mar, MD,  FACP, [facility], [facility], [facility]   [facility]  Physician Advisor [place]     To contact the attending provider between 7A-7P or the covering provider during after hours 7P-7A, please log into the  web site [email or web address] and access using universal [place] password for that web site. If you do not have the password, please call the hospital operator.

## 2024-08-16 NOTE — Progress Notes (Signed)
 Patient leaving with ems at this time to [facility], patient sent with meds from pharmacy.

## 2024-08-16 NOTE — Plan of Care (Signed)

## 2024-08-18 ENCOUNTER — Other Ambulatory Visit: Payer: Self-pay

## 2024-08-18 MED ORDER — MAGNESIUM OXIDE -MG SUPPLEMENT 400 (240 MG) MG PO TABS: 1.0000 | ORAL_TABLET | Freq: Three times a day (TID) | ORAL | Status: DC

## 2024-08-21 ENCOUNTER — Other Ambulatory Visit: Payer: Self-pay | Admitting: *Deleted

## 2024-08-21 MED ORDER — MAGNESIUM OXIDE -MG SUPPLEMENT 400 (240 MG) MG PO TABS: 1.0000 | ORAL_TABLET | Freq: Three times a day (TID) | ORAL | Status: DC

## 2024-08-24 ENCOUNTER — Encounter: Payer: Self-pay | Admitting: Oncology

## 2024-08-24 ENCOUNTER — Inpatient Hospital Stay

## 2024-08-24 ENCOUNTER — Inpatient Hospital Stay: Attending: Hematology

## 2024-08-24 DIAGNOSIS — C9000 Multiple myeloma not having achieved remission: Secondary | ICD-10-CM

## 2024-08-24 DIAGNOSIS — Z5112 Encounter for antineoplastic immunotherapy: Secondary | ICD-10-CM | POA: Insufficient documentation

## 2024-08-24 DIAGNOSIS — N1832 Chronic kidney disease, stage 3b: Secondary | ICD-10-CM | POA: Insufficient documentation

## 2024-08-24 DIAGNOSIS — D631 Anemia in chronic kidney disease: Secondary | ICD-10-CM | POA: Insufficient documentation

## 2024-08-24 DIAGNOSIS — E538 Deficiency of other specified B group vitamins: Secondary | ICD-10-CM

## 2024-08-24 LAB — CBC WITH DIFFERENTIAL/PLATELET
Abs Immature Granulocytes: 0.02 K/uL (ref 0.00–0.07)
Basophils Absolute: 0 K/uL (ref 0.0–0.1)
Basophils Relative: 1 %
Eosinophils Absolute: 0.4 K/uL (ref 0.0–0.5)
Eosinophils Relative: 10 %
HCT: 30.2 % — ABNORMAL LOW (ref 36.0–46.0)
Hemoglobin: 9.3 g/dL — ABNORMAL LOW (ref 12.0–15.0)
Immature Granulocytes: 1 %
Lymphocytes Relative: 23 %
Lymphs Abs: 0.9 K/uL (ref 0.7–4.0)
MCH: 31.7 pg (ref 26.0–34.0)
MCHC: 30.8 g/dL (ref 30.0–36.0)
MCV: 103.1 fL — ABNORMAL HIGH (ref 80.0–100.0)
Monocytes Absolute: 0.2 K/uL (ref 0.1–1.0)
Monocytes Relative: 4 %
Neutro Abs: 2.5 K/uL (ref 1.7–7.7)
Neutrophils Relative %: 61 %
Platelets: 233 K/uL (ref 150–400)
RBC: 2.93 MIL/uL — ABNORMAL LOW (ref 3.87–5.11)
RDW: 17.9 % — ABNORMAL HIGH (ref 11.5–15.5)
WBC: 4 K/uL (ref 4.0–10.5)
nRBC: 0 % (ref 0.0–0.2)

## 2024-08-24 LAB — COMPREHENSIVE METABOLIC PANEL WITH GFR
ALT: 9 U/L (ref 0–44)
AST: 15 U/L (ref 15–41)
Albumin: 2.4 g/dL — ABNORMAL LOW (ref 3.5–5.0)
Alkaline Phosphatase: 146 U/L — ABNORMAL HIGH (ref 38–126)
Anion gap: 9 (ref 5–15)
BUN: 12 mg/dL (ref 8–23)
CO2: 22 mmol/L (ref 22–32)
Calcium: 8.7 mg/dL — ABNORMAL LOW (ref 8.9–10.3)
Chloride: 109 mmol/L (ref 98–111)
Creatinine, Ser: 1.1 mg/dL — ABNORMAL HIGH (ref 0.44–1.00)
GFR, Estimated: 50 mL/min — ABNORMAL LOW (ref >60)
Glucose, Bld: 109 mg/dL — ABNORMAL HIGH (ref 70–99)
Potassium: 3.6 mmol/L (ref 3.5–5.1)
Sodium: 140 mmol/L (ref 135–145)
Total Bilirubin: 0.7 mg/dL (ref 0.0–1.2)
Total Protein: 6.2 g/dL — ABNORMAL LOW (ref 6.5–8.1)

## 2024-08-24 LAB — MAGNESIUM: Magnesium: 1.6 mg/dL — ABNORMAL LOW (ref 1.7–2.4)

## 2024-08-24 MED ORDER — SODIUM CHLORIDE 0.9 % IV SOLN: INTRAVENOUS | Status: DC

## 2024-08-24 MED ORDER — BORTEZOMIB CHEMO SQ INJECTION 3.5 MG (2.5MG/ML)
1.3000 mg/m2 | Freq: Once | INTRAMUSCULAR | Status: AC
  Administered 2024-08-24: 2.25 mg via SUBCUTANEOUS
  Filled 2024-08-24: qty 0.9

## 2024-08-24 MED ORDER — EPOETIN ALFA-EPBX 20000 UNIT/ML IJ SOLN
20000.0000 [IU] | Freq: Once | INTRAMUSCULAR | Status: AC
  Administered 2024-08-24: 20000 [IU] via SUBCUTANEOUS
  Filled 2024-08-24: qty 1

## 2024-08-24 MED ORDER — MAGNESIUM SULFATE 2 GM/50ML IV SOLN
2.0000 g | Freq: Once | INTRAVENOUS | Status: AC
  Administered 2024-08-24: 2 g via INTRAVENOUS
  Filled 2024-08-24: qty 50

## 2024-08-24 MED ORDER — PROCHLORPERAZINE MALEATE 10 MG PO TABS
10.0000 mg | ORAL_TABLET | Freq: Once | ORAL | Status: AC
  Administered 2024-08-24: 10 mg via ORAL
  Filled 2024-08-24: qty 1

## 2024-08-24 MED ORDER — DEXAMETHASONE 4 MG PO TABS
20.0000 mg | ORAL_TABLET | Freq: Once | ORAL | Status: AC
  Administered 2024-08-24: 20 mg via ORAL
  Filled 2024-08-24: qty 5

## 2024-08-24 NOTE — Progress Notes (Signed)
 Patient presents today for Velcade . Magnesium  1.6 2g of magnesium  ordered per standing orders, patient tolerated infusion with no complaints voiced. Hemoglobin reviewed prior to administration. VSS tolerated retacrit , injection without incident or complaint. See MAR for details. Patient stable during and after injection. Patient tolerated Velcade  injection with no complaints voiced. Lab work reviewed. See MAR for details. Injection site clean and dry with no bruising or swelling noted. Patient stable during and after injection. Band aid applied. VSS. Patient left in satisfactory condition with niece with no s/s of distress noted.

## 2024-08-24 NOTE — Patient Instructions (Signed)
 [facility] [place] - A DEPT OF [place]  Discharge Instructions: Thank you for choosing [facility] to provide your oncology and hematology care.  If you have a lab appointment with the Cancer Center - please note that after April 8th, 2024, all labs will be drawn in the cancer center.  You do not have to check in or register with the main entrance as you have in the past but will complete your check-in in the cancer center.  Wear comfortable clothing and clothing appropriate for easy access to any Portacath or PICC line.   We strive to give you quality time with your provider. You may need to reschedule your appointment if you arrive late (15 or more minutes).  Arriving late affects you and other patients whose appointments are after yours.  Also, if you miss three or more appointments without notifying the office, you may be dismissed from the clinic at the provider's discretion.      For prescription refill requests, have your pharmacy contact our office and allow 72 hours for refills to be completed.    Today you received the following chemotherapy and/or immunotherapy agents Velcade , and Magnesium . Return as scheduled.   To help prevent nausea and vomiting after your treatment, we encourage you to take your nausea medication as directed.  BELOW ARE SYMPTOMS THAT SHOULD BE REPORTED IMMEDIATELY: *FEVER GREATER THAN 100.4 F (38 C) OR HIGHER *CHILLS OR SWEATING *NAUSEA AND VOMITING THAT IS NOT CONTROLLED WITH YOUR NAUSEA MEDICATION *UNUSUAL SHORTNESS OF BREATH *UNUSUAL BRUISING OR BLEEDING *URINARY PROBLEMS (pain or burning when urinating, or frequent urination) *BOWEL PROBLEMS (unusual diarrhea, constipation, pain near the anus) TENDERNESS IN MOUTH AND THROAT WITH OR WITHOUT PRESENCE OF ULCERS (sore throat, sores in mouth, or a toothache) UNUSUAL RASH, SWELLING OR PAIN  UNUSUAL VAGINAL DISCHARGE OR ITCHING   Items with * indicate a potential  emergency and should be followed up as soon as possible or go to the Emergency Department if any problems should occur.  Please show the CHEMOTHERAPY ALERT CARD or IMMUNOTHERAPY ALERT CARD at check-in to the Emergency Department and triage nurse.  Should you have questions after your visit or need to cancel or reschedule your appointment, please contact [facility] [place] - A DEPT OF [name] [phone]  and follow the prompts.  Office hours are 8:00 a.m. to 4:30 p.m. Monday - Friday. Please note that voicemails left after 4:00 p.m. may not be returned until the following business day.  We are closed weekends and major holidays. You have access to a nurse at all times for urgent questions. Please call the main number to the clinic [phone] and follow the prompts.  For any non-urgent questions, you may also contact your provider using MyChart. We now offer e-Visits for anyone 13 and older to request care online for non-urgent symptoms. For details visit [email or web address].   Also download the MyChart app! Go to the app store, search MyChart, open the app, select , and log in with your MyChart username and password.

## 2024-08-25 ENCOUNTER — Other Ambulatory Visit: Payer: Self-pay

## 2024-08-25 DIAGNOSIS — C9000 Multiple myeloma not having achieved remission: Secondary | ICD-10-CM

## 2024-08-25 MED ORDER — LENALIDOMIDE 10 MG PO CAPS: 10.0000 mg | ORAL_CAPSULE | Freq: Every day | ORAL | 0 refills | Status: DC

## 2024-08-25 NOTE — Telephone Encounter (Signed)
 Chart reviewed. Revlimid refilled per last office note with Dr. Ellin Saba.

## 2024-09-07 ENCOUNTER — Inpatient Hospital Stay: Attending: Hematology

## 2024-09-07 ENCOUNTER — Encounter: Payer: Self-pay | Admitting: Oncology

## 2024-09-07 ENCOUNTER — Inpatient Hospital Stay

## 2024-09-07 ENCOUNTER — Other Ambulatory Visit: Payer: Self-pay

## 2024-09-07 VITALS — BP 128/65 | HR 77 | Temp 97.5°F | Resp 18 | Wt 136.1 lb

## 2024-09-07 DIAGNOSIS — D631 Anemia in chronic kidney disease: Secondary | ICD-10-CM | POA: Diagnosis not present

## 2024-09-07 DIAGNOSIS — N1832 Chronic kidney disease, stage 3b: Secondary | ICD-10-CM | POA: Insufficient documentation

## 2024-09-07 DIAGNOSIS — C9000 Multiple myeloma not having achieved remission: Secondary | ICD-10-CM | POA: Diagnosis present

## 2024-09-07 DIAGNOSIS — E538 Deficiency of other specified B group vitamins: Secondary | ICD-10-CM

## 2024-09-07 DIAGNOSIS — Z5112 Encounter for antineoplastic immunotherapy: Secondary | ICD-10-CM | POA: Diagnosis present

## 2024-09-07 LAB — COMPREHENSIVE METABOLIC PANEL WITH GFR
ALT: 11 U/L (ref 0–44)
AST: 18 U/L (ref 15–41)
Albumin: 2.5 g/dL — ABNORMAL LOW (ref 3.5–5.0)
Alkaline Phosphatase: 138 U/L — ABNORMAL HIGH (ref 38–126)
Anion gap: 13 (ref 5–15)
BUN: 13 mg/dL (ref 8–23)
CO2: 18 mmol/L — ABNORMAL LOW (ref 22–32)
Calcium: 8.7 mg/dL — ABNORMAL LOW (ref 8.9–10.3)
Chloride: 106 mmol/L (ref 98–111)
Creatinine, Ser: 1.1 mg/dL — ABNORMAL HIGH (ref 0.44–1.00)
GFR, Estimated: 50 mL/min — ABNORMAL LOW (ref >60)
Glucose, Bld: 100 mg/dL — ABNORMAL HIGH (ref 70–99)
Potassium: 4.8 mmol/L (ref 3.5–5.1)
Sodium: 137 mmol/L (ref 135–145)
Total Bilirubin: 1 mg/dL (ref 0.0–1.2)
Total Protein: 5.9 g/dL — ABNORMAL LOW (ref 6.5–8.1)

## 2024-09-07 LAB — CBC WITH DIFFERENTIAL/PLATELET
Abs Immature Granulocytes: 0.02 K/uL (ref 0.00–0.07)
Basophils Absolute: 0 K/uL (ref 0.0–0.1)
Basophils Relative: 1 %
Eosinophils Absolute: 0.1 K/uL (ref 0.0–0.5)
Eosinophils Relative: 3 %
HCT: 30.4 % — ABNORMAL LOW (ref 36.0–46.0)
Hemoglobin: 9.2 g/dL — ABNORMAL LOW (ref 12.0–15.0)
Immature Granulocytes: 1 %
Lymphocytes Relative: 26 %
Lymphs Abs: 1 K/uL (ref 0.7–4.0)
MCH: 31.5 pg (ref 26.0–34.0)
MCHC: 30.3 g/dL (ref 30.0–36.0)
MCV: 104.1 fL — ABNORMAL HIGH (ref 80.0–100.0)
Monocytes Absolute: 0.4 K/uL (ref 0.1–1.0)
Monocytes Relative: 9 %
Neutro Abs: 2.4 K/uL (ref 1.7–7.7)
Neutrophils Relative %: 60 %
Platelets: 180 K/uL (ref 150–400)
RBC: 2.92 MIL/uL — ABNORMAL LOW (ref 3.87–5.11)
RDW: 19.2 % — ABNORMAL HIGH (ref 11.5–15.5)
WBC: 4 K/uL (ref 4.0–10.5)
nRBC: 0 % (ref 0.0–0.2)

## 2024-09-07 LAB — MAGNESIUM: Magnesium: 1.6 mg/dL — ABNORMAL LOW (ref 1.7–2.4)

## 2024-09-07 MED ORDER — PROCHLORPERAZINE MALEATE 10 MG PO TABS
10.0000 mg | ORAL_TABLET | Freq: Once | ORAL | Status: AC
  Administered 2024-09-07: 10 mg via ORAL
  Filled 2024-09-07: qty 1

## 2024-09-07 MED ORDER — MAGNESIUM SULFATE 2 GM/50ML IV SOLN
2.0000 g | Freq: Once | INTRAVENOUS | Status: AC
  Administered 2024-09-07: 2 g via INTRAVENOUS
  Filled 2024-09-07: qty 50

## 2024-09-07 MED ORDER — BORTEZOMIB CHEMO SQ INJECTION 3.5 MG (2.5MG/ML)
1.3000 mg/m2 | Freq: Once | INTRAMUSCULAR | Status: AC
  Administered 2024-09-07: 2.25 mg via SUBCUTANEOUS
  Filled 2024-09-07: qty 0.9

## 2024-09-07 MED ORDER — EPOETIN ALFA-EPBX 20000 UNIT/ML IJ SOLN
20000.0000 [IU] | Freq: Once | INTRAMUSCULAR | Status: AC
  Administered 2024-09-07: 20000 [IU] via SUBCUTANEOUS
  Filled 2024-09-07: qty 1

## 2024-09-07 MED ORDER — DEXAMETHASONE 4 MG PO TABS
20.0000 mg | ORAL_TABLET | Freq: Once | ORAL | Status: AC
  Administered 2024-09-07: 20 mg via ORAL
  Filled 2024-09-07: qty 5

## 2024-09-07 MED ORDER — SODIUM CHLORIDE 0.9 % IV SOLN: INTRAVENOUS | Status: DC

## 2024-09-07 NOTE — Patient Instructions (Signed)
 [facility] [place] - A DEPT OF [place]  Discharge Instructions: Thank you for choosing [facility] to provide your oncology and hematology care.  If you have a lab appointment with the Cancer Center - please note that after April 8th, 2024, all labs will be drawn in the cancer center.  You do not have to check in or register with the main entrance as you have in the past but will complete your check-in in the cancer center.  Wear comfortable clothing and clothing appropriate for easy access to any Portacath or PICC line.   We strive to give you quality time with your provider. You may need to reschedule your appointment if you arrive late (15 or more minutes).  Arriving late affects you and other patients whose appointments are after yours.  Also, if you miss three or more appointments without notifying the office, you may be dismissed from the clinic at the provider's discretion.      For prescription refill requests, have your pharmacy contact our office and allow 72 hours for refills to be completed.    Today you received the following chemotherapy and/or immunotherapy agents Velcade , Retacrit , and 2g IV magnesium  sulfate      To help prevent nausea and vomiting after your treatment, we encourage you to take your nausea medication as directed.  BELOW ARE SYMPTOMS THAT SHOULD BE REPORTED IMMEDIATELY: *FEVER GREATER THAN 100.4 F (38 C) OR HIGHER *CHILLS OR SWEATING *NAUSEA AND VOMITING THAT IS NOT CONTROLLED WITH YOUR NAUSEA MEDICATION *UNUSUAL SHORTNESS OF BREATH *UNUSUAL BRUISING OR BLEEDING *URINARY PROBLEMS (pain or burning when urinating, or frequent urination) *BOWEL PROBLEMS (unusual diarrhea, constipation, pain near the anus) TENDERNESS IN MOUTH AND THROAT WITH OR WITHOUT PRESENCE OF ULCERS (sore throat, sores in mouth, or a toothache) UNUSUAL RASH, SWELLING OR PAIN  UNUSUAL VAGINAL DISCHARGE OR ITCHING   Items with * indicate a  potential emergency and should be followed up as soon as possible or go to the Emergency Department if any problems should occur.  Please show the CHEMOTHERAPY ALERT CARD or IMMUNOTHERAPY ALERT CARD at check-in to the Emergency Department and triage nurse.  Should you have questions after your visit or need to cancel or reschedule your appointment, please contact [facility] [place] - A DEPT OF [name] [phone]  and follow the prompts.  Office hours are 8:00 a.m. to 4:30 p.m. Monday - Friday. Please note that voicemails left after 4:00 p.m. may not be returned until the following business day.  We are closed weekends and major holidays. You have access to a nurse at all times for urgent questions. Please call the main number to the clinic [phone] and follow the prompts.  For any non-urgent questions, you may also contact your provider using MyChart. We now offer e-Visits for anyone 76 and older to request care online for non-urgent symptoms. For details visit [email or web address].   Also download the MyChart app! Go to the app store, search MyChart, open the app, select [place], and log in with your MyChart username and password.

## 2024-09-07 NOTE — Progress Notes (Signed)
 Patient presents today for Velade and Retacrit  20,000U injections.  Patient is in satisfactory condition with no new complaints voiced.  Vital signs are stable.  Labs reviewed and all labs are within treatment parameters. Patient will receive 2g IV magnesium  sulfate per standing orders. We will proceed with treatment per MD orders.    Discharged from clinic via wheelchair in stable condition. Alert and oriented x 3. F/U with [facility] as scheduled.

## 2024-09-08 LAB — KAPPA/LAMBDA LIGHT CHAINS
Kappa free light chain: 102.6 mg/L — ABNORMAL HIGH (ref 3.3–19.4)
Kappa, lambda light chain ratio: 4.62 — ABNORMAL HIGH (ref 0.26–1.65)
Lambda free light chains: 22.2 mg/L (ref 5.7–26.3)

## 2024-09-11 LAB — PROTEIN ELECTROPHORESIS, SERUM
A/G Ratio: 1.1 (ref 0.7–1.7)
Albumin ELP: 2.8 g/dL — ABNORMAL LOW (ref 2.9–4.4)
Alpha-1-Globulin: 0.2 g/dL (ref 0.0–0.4)
Alpha-2-Globulin: 0.6 g/dL (ref 0.4–1.0)
Beta Globulin: 1.1 g/dL (ref 0.7–1.3)
Gamma Globulin: 0.7 g/dL (ref 0.4–1.8)
Globulin, Total: 2.6 g/dL (ref 2.2–3.9)
M-Spike, %: 0.5 g/dL — ABNORMAL HIGH
Total Protein ELP: 5.4 g/dL — ABNORMAL LOW (ref 6.0–8.5)

## 2024-09-14 ENCOUNTER — Encounter (HOSPITAL_COMMUNITY): Payer: Self-pay | Admitting: Oncology

## 2024-09-14 ENCOUNTER — Encounter: Payer: Self-pay | Admitting: Oncology

## 2024-09-14 ENCOUNTER — Ambulatory Visit (HOSPITAL_COMMUNITY)
Admission: RE | Admit: 2024-09-14 | Discharge: 2024-09-14 | Disposition: A | Discharge status: Home / Self Care | Source: Ambulatory Visit | Attending: Hematology | Admitting: Hematology

## 2024-09-14 DIAGNOSIS — C9000 Multiple myeloma not having achieved remission: Secondary | ICD-10-CM | POA: Diagnosis present

## 2024-09-14 DIAGNOSIS — C50212 Malignant neoplasm of upper-inner quadrant of left female breast: Secondary | ICD-10-CM | POA: Diagnosis present

## 2024-09-14 DIAGNOSIS — Z170 Estrogen receptor positive status [ER+]: Secondary | ICD-10-CM | POA: Diagnosis present

## 2024-09-14 MED ORDER — FLUDEOXYGLUCOSE F - 18 (FDG) INJECTION
6.8300 | Freq: Once | INTRAVENOUS | Status: AC | PRN
  Administered 2024-09-14: 6.83 via INTRAVENOUS

## 2024-09-18 ENCOUNTER — Encounter: Payer: Self-pay | Admitting: Oncology

## 2024-09-18 ENCOUNTER — Encounter (HOSPITAL_COMMUNITY): Payer: Self-pay | Admitting: Oncology

## 2024-09-20 ENCOUNTER — Other Ambulatory Visit: Payer: Self-pay

## 2024-09-20 DIAGNOSIS — C9000 Multiple myeloma not having achieved remission: Secondary | ICD-10-CM

## 2024-09-21 ENCOUNTER — Inpatient Hospital Stay (HOSPITAL_BASED_OUTPATIENT_CLINIC_OR_DEPARTMENT_OTHER): Admitting: Oncology

## 2024-09-21 ENCOUNTER — Inpatient Hospital Stay

## 2024-09-21 ENCOUNTER — Encounter: Payer: Self-pay | Admitting: Oncology

## 2024-09-21 VITALS — BP 113/58 | HR 65 | Temp 98.5°F | Resp 18 | Wt 134.0 lb

## 2024-09-21 VITALS — BP 126/60 | HR 60 | Temp 98.2°F | Resp 18

## 2024-09-21 DIAGNOSIS — C9000 Multiple myeloma not having achieved remission: Secondary | ICD-10-CM

## 2024-09-21 DIAGNOSIS — Z5112 Encounter for antineoplastic immunotherapy: Secondary | ICD-10-CM | POA: Diagnosis not present

## 2024-09-21 DIAGNOSIS — E538 Deficiency of other specified B group vitamins: Secondary | ICD-10-CM

## 2024-09-21 LAB — COMPREHENSIVE METABOLIC PANEL WITH GFR
ALT: 12 U/L (ref 0–44)
AST: 14 U/L — ABNORMAL LOW (ref 15–41)
Albumin: 2.4 g/dL — ABNORMAL LOW (ref 3.5–5.0)
Alkaline Phosphatase: 128 U/L — ABNORMAL HIGH (ref 38–126)
Anion gap: 9 (ref 5–15)
BUN: 12 mg/dL (ref 8–23)
CO2: 20 mmol/L — ABNORMAL LOW (ref 22–32)
Calcium: 8.7 mg/dL — ABNORMAL LOW (ref 8.9–10.3)
Chloride: 109 mmol/L (ref 98–111)
Creatinine, Ser: 1.1 mg/dL — ABNORMAL HIGH (ref 0.44–1.00)
GFR, Estimated: 50 mL/min — ABNORMAL LOW (ref >60)
Glucose, Bld: 92 mg/dL (ref 70–99)
Potassium: 4.3 mmol/L (ref 3.5–5.1)
Sodium: 138 mmol/L (ref 135–145)
Total Bilirubin: 1.2 mg/dL (ref 0.0–1.2)
Total Protein: 5.4 g/dL — ABNORMAL LOW (ref 6.5–8.1)

## 2024-09-21 LAB — CBC WITH DIFFERENTIAL/PLATELET
Abs Immature Granulocytes: 0.02 K/uL (ref 0.00–0.07)
Basophils Absolute: 0.1 K/uL (ref 0.0–0.1)
Basophils Relative: 1 %
Eosinophils Absolute: 0.2 K/uL (ref 0.0–0.5)
Eosinophils Relative: 4 %
HCT: 29 % — ABNORMAL LOW (ref 36.0–46.0)
Hemoglobin: 9.1 g/dL — ABNORMAL LOW (ref 12.0–15.0)
Immature Granulocytes: 1 %
Lymphocytes Relative: 24 %
Lymphs Abs: 1 K/uL (ref 0.7–4.0)
MCH: 32.3 pg (ref 26.0–34.0)
MCHC: 31.4 g/dL (ref 30.0–36.0)
MCV: 102.8 fL — ABNORMAL HIGH (ref 80.0–100.0)
Monocytes Absolute: 0.2 K/uL (ref 0.1–1.0)
Monocytes Relative: 4 %
Neutro Abs: 2.6 K/uL (ref 1.7–7.7)
Neutrophils Relative %: 66 %
Platelets: 138 K/uL — ABNORMAL LOW (ref 150–400)
RBC: 2.82 MIL/uL — ABNORMAL LOW (ref 3.87–5.11)
RDW: 19.1 % — ABNORMAL HIGH (ref 11.5–15.5)
WBC: 3.9 K/uL — ABNORMAL LOW (ref 4.0–10.5)
nRBC: 0 % (ref 0.0–0.2)

## 2024-09-21 LAB — MAGNESIUM: Magnesium: 1.7 mg/dL (ref 1.7–2.4)

## 2024-09-21 MED ORDER — DEXAMETHASONE 4 MG PO TABS
20.0000 mg | ORAL_TABLET | Freq: Once | ORAL | Status: AC
  Administered 2024-09-21: 20 mg via ORAL
  Filled 2024-09-21: qty 5

## 2024-09-21 MED ORDER — BORTEZOMIB CHEMO SQ INJECTION 3.5 MG (2.5MG/ML)
1.3000 mg/m2 | Freq: Once | INTRAMUSCULAR | Status: AC
  Administered 2024-09-21: 2.25 mg via SUBCUTANEOUS
  Filled 2024-09-21: qty 0.9

## 2024-09-21 MED ORDER — EPOETIN ALFA-EPBX 20000 UNIT/ML IJ SOLN
20000.0000 [IU] | Freq: Once | INTRAMUSCULAR | Status: AC
  Administered 2024-09-21: 20000 [IU] via SUBCUTANEOUS
  Filled 2024-09-21: qty 1

## 2024-09-21 MED ORDER — PROCHLORPERAZINE MALEATE 10 MG PO TABS
10.0000 mg | ORAL_TABLET | Freq: Once | ORAL | Status: AC
  Administered 2024-09-21: 10 mg via ORAL
  Filled 2024-09-21: qty 1

## 2024-09-21 NOTE — Patient Instructions (Addendum)
 [facility] at [facility] Discharge Instructions   You were seen and examined today by Dr. Davonna.  She reviewed the results of your lab work which are normal/stable.   Continue Revlimid  and anastrozole  as prescribed.   We will proceed with your treatment today.   Return as scheduled.    Thank you for choosing [facility] at [facility] to provide your oncology and hematology care.  To afford each patient quality time with our provider, please arrive at least 15 minutes before your scheduled appointment time.   If you have a lab appointment with the Cancer Center please come in thru the Main Entrance and check in at the main information desk.  You need to re-schedule your appointment should you arrive 10 or more minutes late.  We strive to give you quality time with our providers, and arriving late affects you and other patients whose appointments are after yours.  Also, if you no show three or more times for appointments you may be dismissed from the clinic at the providers discretion.     Again, thank you for choosing [facility].  Our hope is that these requests will decrease the amount of time that you wait before being seen by our physicians.       _____________________________________________________________  Should you have questions after your visit to [facility], please contact our office at [phone] and follow the prompts.  Our office hours are 8:00 a.m. and 4:30 p.m. Monday - Friday.  Please note that voicemails left after 4:00 p.m. may not be returned until the following business day.  We are closed weekends and major holidays.  You do have access to a nurse 24-7, just call the main number to the clinic [phone] and do not press any options, hold on the line and a nurse will answer the phone.    For prescription refill requests, have your pharmacy contact our office and allow 72 hours.    Due to  Covid, you will need to wear a mask upon entering the hospital. If you do not have a mask, a mask will be given to you at the Main Entrance upon arrival. For doctor visits, patients may have 1 support person age 39 or older with them. For treatment visits, patients can not have anyone with them due to social distancing guidelines and our immunocompromised population.

## 2024-09-21 NOTE — Progress Notes (Signed)
 Patient presents today for chemotherapy injection of Velcade  and a Retacrit  injection. Patient is in satisfactory condition with no new complaints voiced.  Vital signs are stable.  Labs reviewed by Dr. Davonna during the office visit and all labs are within treatment parameters. HGB 9.1.  We will proceed with treatment per MD orders.   Patient tolerated Retacrit  injection in left arm well with no complaints voiced.  Site clean and dry with no bruising or swelling noted.   Patient tolerated Velcade  treatment injection in left abd well with no complaints voiced. No bruising or swelling noted. Patient left via wheelchair in stable condition.  Vital signs stable at discharge.  Follow up as scheduled.

## 2024-09-21 NOTE — Patient Instructions (Signed)
 [facility] [place] - A DEPT OF [facility]  Discharge Instructions: Thank you for choosing [facility] to provide your oncology and hematology care.  If you have a lab appointment with the Cancer Center - please note that after April 8th, 2024, all labs will be drawn in the cancer center.  You do not have to check in or register with the main entrance as you have in the past but will complete your check-in in the cancer center.  Wear comfortable clothing and clothing appropriate for easy access to any Portacath or PICC line.   We strive to give you quality time with your provider. You may need to reschedule your appointment if you arrive late (15 or more minutes).  Arriving late affects you and other patients whose appointments are after yours.  Also, if you miss three or more appointments without notifying the office, you may be dismissed from the clinic at the provider's discretion.      For prescription refill requests, have your pharmacy contact our office and allow 72 hours for refills to be completed.    Today you received the following chemotherapy and/or immunotherapy agents Velcade.  Bortezomib Injection What is this medication? BORTEZOMIB (bor TEZ oh mib) treats lymphoma. It may also be used to treat multiple myeloma, a type of bone marrow cancer. It works by blocking a protein that causes cancer cells to grow and multiply. This helps to slow or stop the spread of cancer cells. This medicine may be used for other purposes; ask your health care provider or pharmacist if you have questions. COMMON BRAND NAME(S): BORUZU, Velcade What should I tell my care team before I take this medication? They need to know if you have any of these conditions: Dehydration Diabetes Heart disease Liver disease Tingling of the fingers or toes or other nerve disorder An unusual or allergic reaction to bortezomib, other medications, foods, dyes, or preservatives If  you or your partner are pregnant or trying to get pregnant Breastfeeding How should I use this medication? This medication is injected into a vein or under the skin. It is given by your care team in a hospital or clinic setting. Talk to your care team about the use of this medication in children. Special care may be needed. Overdosage: If you think you have taken too much of this medicine contact a poison control center or emergency room at once. NOTE: This medicine is only for you. Do not share this medicine with others. What if I miss a dose? Keep appointments for follow-up doses. It is important not to miss your dose. Call your care team if you are unable to keep an appointment. What may interact with this medication? Ketoconazole Rifampin This list may not describe all possible interactions. Give your health care provider a list of all the medicines, herbs, non-prescription drugs, or dietary supplements you use. Also tell them if you smoke, drink alcohol, or use illegal drugs. Some items may interact with your medicine. What should I watch for while using this medication? Your condition will be monitored carefully while you are receiving this medication. You may need blood work while taking this medication. This medication may affect your coordination, reaction time, or judgment. Do not drive or operate machinery until you know how this medication affects you. Sit up or stand slowly to reduce the risk of dizzy or fainting spells. Drinking alcohol with this medication can increase the risk of these side effects.  This medication may increase your risk of getting an infection. Call your care team for advice if you get a fever, chills, sore throat, or other symptoms of a cold or flu. Do not treat yourself. Try to avoid being around people who are sick. Check with your care team if you have severe diarrhea, nausea, and vomiting, or if you sweat a lot. The loss of too much body fluid may make it  dangerous for you to take this medication. Talk to your care team if you may be pregnant. Serious birth defects can occur if you take this medication during pregnancy and for 7 months after the last dose. You will need a negative pregnancy test before starting this medication. Contraception is recommended while taking this medication and for 7 months after the last dose. Your care team can help you find the option that works for you. If your partner can get pregnant, use a condom during sex while taking this medication and for 4 months after the last dose. Do not breastfeed while taking this medication and for 2 months after the last dose. This medication may cause infertility. Talk to your care team if you are concerned about your fertility. What side effects may I notice from receiving this medication? Side effects that you should report to your care team as soon as possible: Allergic reactions--skin rash, itching, hives, swelling of the face, lips, tongue, or throat Bleeding--bloody or black, tar-like stools, vomiting blood or [place] material that looks like coffee grounds, red or dark [place] urine, small red or purple spots on skin, unusual bruising or bleeding Bleeding in the brain--severe headache, stiff neck, confusion, dizziness, change in vision, numbness or weakness of the face, arm, or leg, trouble speaking, trouble walking, vomiting Bowel blockage--stomach cramping, unable to have a bowel movement or pass gas, loss of appetite, vomiting Heart failure--shortness of breath, swelling of the ankles, feet, or hands, sudden weight gain, unusual weakness or fatigue Infection--fever, chills, cough, sore throat, wounds that don't heal, pain or trouble when passing urine, general feeling of discomfort or being unwell Liver injury--right upper belly pain, loss of appetite, nausea, light-colored stool, dark yellow or [place] urine, yellowing skin or eyes, unusual weakness or fatigue Low blood  pressure--dizziness, feeling faint or lightheaded, blurry vision Lung injury--shortness of breath or trouble breathing, cough, spitting up blood, chest pain, fever Pain, tingling, or numbness in the hands or feet Severe or prolonged diarrhea Stomach pain, bloody diarrhea, pale skin, unusual weakness or fatigue, decrease in the amount of urine, which may be signs of hemolytic uremic syndrome Sudden and severe headache, confusion, change in vision, seizures, which may be signs of posterior reversible encephalopathy syndrome (PRES) TTP--purple spots on the skin or inside the mouth, pale skin, yellowing skin or eyes, unusual weakness or fatigue, fever, fast or irregular heartbeat, confusion, change in vision, trouble speaking, trouble walking Tumor lysis syndrome (TLS)--nausea, vomiting, diarrhea, decrease in the amount of urine, dark urine, unusual weakness or fatigue, confusion, muscle pain or cramps, fast or irregular heartbeat, joint pain Side effects that usually do not require medical attention (report to your care team if they continue or are bothersome): Constipation Diarrhea Fatigue Loss of appetite Nausea This list may not describe all possible side effects. Call your doctor for medical advice about side effects. You may report side effects to FDA at 1-800-FDA-1088. Where should I keep my medication? This medication is given in a hospital or clinic. It will not be stored at home. NOTE: This sheet  is a summary. It may not cover all possible information. If you have questions about this medicine, talk to your doctor, pharmacist, or health care provider.  2024 Elsevier/Gold Standard (2022-05-19 00:00:00)       To help prevent nausea and vomiting after your treatment, we encourage you to take your nausea medication as directed.  BELOW ARE SYMPTOMS THAT SHOULD BE REPORTED IMMEDIATELY: *FEVER GREATER THAN 100.4 F (38 C) OR HIGHER *CHILLS OR SWEATING *NAUSEA AND VOMITING THAT IS NOT  CONTROLLED WITH YOUR NAUSEA MEDICATION *UNUSUAL SHORTNESS OF BREATH *UNUSUAL BRUISING OR BLEEDING *URINARY PROBLEMS (pain or burning when urinating, or frequent urination) *BOWEL PROBLEMS (unusual diarrhea, constipation, pain near the anus) TENDERNESS IN MOUTH AND THROAT WITH OR WITHOUT PRESENCE OF ULCERS (sore throat, sores in mouth, or a toothache) UNUSUAL RASH, SWELLING OR PAIN  UNUSUAL VAGINAL DISCHARGE OR ITCHING   Items with * indicate a potential emergency and should be followed up as soon as possible or go to the Emergency Department if any problems should occur.  Please show the CHEMOTHERAPY ALERT CARD or IMMUNOTHERAPY ALERT CARD at check-in to the Emergency Department and triage nurse.  Should you have questions after your visit or need to cancel or reschedule your appointment, please contact [facility]  - A DEPT OF [facility] [phone]  and follow the prompts.  Office hours are 8:00 a.m. to 4:30 p.m. Monday - Friday. Please note that voicemails left after 4:00 p.m. may not be returned until the following business day.  We are closed weekends and major holidays. You have access to a nurse at all times for urgent questions. Please call the main number to the clinic [phone] and follow the prompts.  For any non-urgent questions, you may also contact your provider using MyChart. We now offer e-Visits for anyone 29 and older to request care online for non-urgent symptoms. For details visit [email or web address].   Also download the MyChart app! Go to the app store, search "MyChart", open the app, select [place], and log in with your MyChart username and password.

## 2024-09-21 NOTE — Progress Notes (Signed)
 Patient is taking Revlimid  as prescribed. She has not missed any doses and reports no side effects at this time.    Patient has been examined by Dr. Davonna. Vital signs and labs have been reviewed by MD - ANC, Creatinine, LFTs, hemoglobin, and platelets have been reviewed by M.D. - pt may proceed with treatment.  Primary RN and pharmacy notified.

## 2024-09-21 NOTE — Progress Notes (Signed)
 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 female was transferred to my care after her prior physician has left.   ASSESSMENT & PLAN:   Assessment & Plan: Brittany Archer  is a 83 y.o. 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 Considering patient's worsening multiple myeloma, patient was started on anastrozole .  Lumpectomy and radiation were deferred at this time.  -Continue anastrozole  1 mg daily.  Patient reports no side effects from it - No evidence of metastasis on PET scan  IgA kappa multiple myeloma, standard risk Extensive oncology history below Patient has been on Revlimid  and Velcade  since 2019. Patient was recently admitted in the hospital for compression fracture  -Continue Velcade  1.3 mg/m every 2 weeks, continue Revlimid  10 mg 3 weeks on/1 week off - Labs reviewed today: CMP: Creatinine: 1.10-stable, elevated alkaline phosphatase at 128, normal LFTs.  CBC: WBC: 3.9, hemoglobin: 9.1, platelets: 138.  SPEP: M spike: 0.5, free kappa light chain: 102.6, free lambda light chain: 22.2, ratio: 4.62-stable - Physical exam stable today.  Will proceed with treatment today. -We reviewed the recent PET scan together.  No evidence of multiple myeloma at this time - If patient has worsening multiple myeloma, can consider adding daratumumab.  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  She knows to contact our office if she develops concerns prior to her next appointment.  60 minutes of total time was spent for this patient encounter, including preparation,review of records,  face-to-face counseling with the patient and coordination of care, physical exam, and documentation of the encounter.   Mickiel Dry, MD  [facility] [place] - A DEPT OF [name] [address] [place] [address] Dept: [phone] Dept Fax: [phone]   Orders Placed This Encounter  Procedures   Ferritin    Standing Status:   Future    Expected Date:   10/19/2024    Expiration Date:   01/17/2025   Folate    Standing Status:   Future    Expected Date:   10/19/2024    Expiration Date:   01/17/2025   Vitamin B12    Standing Status:   Future    Expected Date:   10/19/2024    Expiration Date:   01/17/2025   Iron and TIBC    Standing Status:   Future    Expected Date:   10/19/2024    Expiration Date:   01/17/2025     ONCOLOGY HISTORY:   I have reviewed her chart and materials related to her cancer extensively and collaborated history with the patient. Summary of oncologic history is as  follows:   Diagnosis: IgA kappa multiple myeloma, standard risk   -11/25/2017: MM Panel: IgA 897. M-spike 0.9. Total globulin 4.0. Beta globulin 1.7.  -11/25/2017: Myeloma Labs: Elevated kappa free light chains at 2,048.9 with FLC ratio at 123. 43. HGB low at 9.4. Creatinine elevated at 1.09. Calcium  elevated at 10.5.  -01/03/2018: Bone Marrow Biopsy.  Pathology: Hypercellular bone marrow for the age with plasma cell neoplasm. The bone  marrow shows increased number of atypical plasma cells representing 60% of all cells in the aspirate associated with interstitial infiltrates and numerous variably sized clusters in the clot and biopsy sections. IHC highlight the increased plasma cell component in the marrow, which shows kappa light chain restriction consistent with plasma cell neoplasm. -Cytogenetics: Abnormal female karyotype.  -FISH: Normal. -01/09/2018-current: RVD with velcade  1.3mg /m2 every 2 weeks and Revlimid  10 mg 3 weeks on/1 week off -01/16/2018-02/07/2020: Denosumab , Discontinued for osteonecrosis of jaw -02/02/2018: Beta-2  microglobulin: 2.4 -02/16/2018: Bone Survey: Subtle patchy areas of osteopenia of the thoracolumbar spine and possibly distal right clavicle that may reflect subtle changes of multiple myeloma. Otherwise negative. -02/18/2018: Positive for proteinuria with elevated kappa light chains.  -02/20/2020: MRI Face Trigeminal: Inflammatory changes along the buccal and lingual aspects of the body and angle of the mandible on the right. There is abnormal signal of the adjacent mandible corresponding to sclerosis on CT likely reflecting sequelae of chronic inflammation. Mild marrow enhancement at the level of the mandibular foramen could reflect an area of more acute osteomyelitis. More focal soft tissue thickening and fluid in the right submental region likely reflecting phlegmon/developing multilocular abscess. -03/19/2021: Debridement of right mandible.   Pathology: Consistent with clinical impression of osteonecrosis.  -2024-2025: PET scans show NED   Diagnosis: Breast Cancer  -07/19/2007: Left Diagnostic Mammogram: Calcifications.  -07/26/2007: Left breast biopsy.  Pathology: DCIS with necrosis, intermediate and focally high grade. Tumor cells are ER positive (98%) and PR positive (52%).  -08/16/2007: Left lumpectomy. Pathology : Intermediate grade DCIS with calcifications, 0.7 cm.  -2008: Radiation  therapy to left breast  -2008-12/15/2023: Patient was under surveillance with mammograms showing NED -12/16/2023: PET: There is a tracer avid soft tissue lesion within the periareolar region of the left breast measuring 1.9 cm with SUV max of 6.10. This is concerning for primary breast neoplasm. There are several morphologically benign appearing lymph nodes within the right axilla which exhibit mild increased tracer uptake. There is a mild tracer avid lymph node within the left axilla which measures 5 mm with SUV max of 2.3. -03/09/2024: Bilateral diagnostic mammogram and US : Suspicious palpable 2.2 cm mass in the left breast at 9 o'clock retroareolar. Suspicious 0.7 cm mass/probable lymph node in the left axilla. Suspicious segmental calcifications involving the outer right breast spanning 8.6 cm. -03/24/2024: Left breast retroareolar biopsy at the 9 o'clock position.  Pathology: Invasive ductal carcinoma, 1.3 cm, with intermediate grade DCIS, cribriform type. Lymphovascular invasion present. Calcifications present.  Tumor cells are ER positive (95%), PR positive (60%), and Her2 negative (0). Ki-67 15%. -03/24/2024: Left axillary lymph node biopsy.  Pathology: Ductal carcinoma involving adipose tissue consistent with metastatic carcinoma.  -03/16/2024: Right breast outer posterior and anterior calcifications biopsies.  Pathology of posterior calcifications: Intermediate grade DCIS, cribriform and micropapillary types, spanning 1.2 cm.  Pathology of anterior calcifications: Invasive ductal carcinoma spanning 0.3 cm with intermediate grade DCIS, cribriform and micropapillary types. DCIS length is 1.3 cm. Tumor cells are ER positive (100%), PR positive (60%), and Her2 negative (1+). Ki-67 10%. -03/30/2024-current:  Anastrozole  -04/20/2024: Bone Density scan: T-score of -1.3 (osteopenia) -Management of breast cancer put on back burner due to patient's worsening myeloma  Current Treatment:  Velcade  +  Revlimid  for multiple myeloma, Anastrozole  1mg  daily for Breast carcinoma  INTERVAL HISTORY:   MERIEM LEMIEUX is here today for follow up and to establish care with me for multiple myeloma and bilateral breast cancer. Patient is accompanied by sister.   Gwenna lives at home and receiving assistance from her sister. We discussed continuing RVD treatment and Anastrozole , as myeloma labs are stable to improved. She is taking Revlimid  and Anastrozole  as prescribed. She denies any hot flashes or arthralgias due to the Anastrozole . Alissah denies any numbness or tingling in the extremities and fatigue. She also denies any jaw pain.   She is wearing a back brace today due to her recent lumbar compression fracture that required hospitalization. Nakaila is taking calcium  supplements.   I have reviewed the past medical history, past surgical history, social history and family history with the patient and they are unchanged from previous note.  ALLERGIES:  is allergic to motrin [ibuprofen] and seasonal ic [cholestatin].  MEDICATIONS:  Current Outpatient Medications  Medication Sig Dispense Refill   acetaminophen  (TYLENOL ) 325 MG tablet Take 2 tablets (650 mg total) by mouth in the morning, at noon, and at bedtime.     anastrozole  (ARIMIDEX ) 1 MG tablet Take 1 tablet (1 mg total) by mouth daily. 30 tablet 6   ASPIRIN  LOW DOSE 81 MG tablet Take 81 mg by mouth daily.     atorvastatin  (LIPITOR) 10 MG tablet Take 10 mg by mouth daily.     bortezomib  IV (VELCADE ) 3.5 MG injection Inject 3.5 mg into the vein every Thursday.     famotidine  (PEPCID ) 20 MG tablet Take 20 mg by mouth at bedtime as needed for heartburn or indigestion.     glucose blood (ACCU-CHEK AVIVA PLUS) test strip CHECK BLOOD SUGAR ONCE DAILY     lidocaine  (LIDODERM ) 5 % Place 2 patches onto the skin daily. Remove & Discard patch within 12 hours or as directed by MD     magnesium  oxide (MAG-OX) 400 (240 Mg) MG tablet Take 1 tablet (400  mg total) by mouth 3 (three) times daily.     memantine  (NAMENDA ) 5 MG tablet Take 5 mg by mouth daily.     metFORMIN  (GLUCOPHAGE -XR) 500 MG 24 hr tablet Take 1 tablet (500 mg total) by mouth daily with supper.     mirtazapine  (REMERON ) 7.5 MG tablet Take 7.5 mg by mouth at bedtime.     oxyCODONE  (OXY IR/ROXICODONE ) 5 MG immediate release tablet Take 1 tablet (5 mg total) by mouth every 6 (six) hours as needed for severe pain (pain score 7-10). 12 tablet 0   pantoprazole  (PROTONIX ) 40 MG tablet Take 1 tablet (40 mg total) by mouth daily.     potassium chloride  (KLOR-CON ) 20 MEQ packet Take 20 mEq by mouth 2 (two) times daily.     senna (SENOKOT) 8.6 MG TABS tablet Take 1 tablet (8.6 mg total) by mouth at bedtime.     lenalidomide  (REVLIMID ) 10 MG capsule Take 1 capsule (10 mg total) by mouth daily. Take daily for 21 days, with 7 days off 21 capsule 0   No current facility-administered medications for this visit.    REVIEW OF SYSTEMS:   Constitutional: Denies fevers, chills or abnormal weight loss Eyes: Denies blurriness of vision Ears, nose, mouth, throat, and face: Denies mucositis or  sore throat Respiratory: Denies cough, dyspnea or wheezes Cardiovascular: Denies palpitation, chest discomfort or lower extremity swelling Gastrointestinal:  Denies nausea, heartburn or change in bowel habits Skin: Denies abnormal skin rashes Lymphatics: Denies new lymphadenopathy or easy bruising Neurological:Denies numbness, tingling or new weaknesses Behavioral/Psych: Mood is stable, no new changes  All other systems were reviewed with the patient and are negative.   VITALS:  Blood pressure (!) 113/58, pulse 65, temperature 98.5 F (36.9 C), temperature source Tympanic, resp. rate 18, weight 134 lb 0.6 oz (60.8 kg), SpO2 99%.  Wt Readings from Last 3 Encounters:  09/21/24 134 lb 0.6 oz (60.8 kg)  09/07/24 136 lb 1.6 oz (61.7 kg)  08/24/24 135 lb 9.6 oz (61.5 kg)    Body mass index is 23.01  kg/m.  Performance status (ECOG): 3 - Symptomatic, >50% confined to bed  PHYSICAL EXAM:   GENERAL:alert, no distress and comfortable, in a wheelchair and a back brace LYMPH:  no palpable lymphadenopathy in the cervical, axillary or inguinal LUNGS: clear to auscultation and percussion with normal breathing effort HEART: regular rate & rhythm and no murmurs and no lower extremity edema ABDOMEN:abdomen soft, non-tender and normal bowel sounds Musculoskeletal:no cyanosis of digits and no clubbing  NEURO: alert & oriented x 3 with fluent speech, no focal motor/sensory deficits  LABORATORY DATA:  I have reviewed the data as listed   Lab Results  Component Value Date   WBC 3.9 (L) 09/21/2024   NEUTROABS 2.6 09/21/2024   HGB 9.1 (L) 09/21/2024   HCT 29.0 (L) 09/21/2024   MCV 102.8 (H) 09/21/2024   PLT 138 (L) 09/21/2024     Chemistry      Component Value Date/Time   NA 138 09/21/2024 0906   K 4.3 09/21/2024 0906   CL 109 09/21/2024 0906   CO2 20 (L) 09/21/2024 0906   BUN 12 09/21/2024 0906   CREATININE 1.10 (H) 09/21/2024 0906      Component Value Date/Time   CALCIUM  8.7 (L) 09/21/2024 0906   ALKPHOS 128 (H) 09/21/2024 0906   AST 14 (L) 09/21/2024 0906   ALT 12 09/21/2024 0906   BILITOT 1.2 09/21/2024 0906      Latest Reference Range & Units 09/07/24 10:57  Total Protein ELP 6.0 - 8.5 g/dL 5.4 (L)  Albumin ELP 2.9 - 4.4 g/dL 2.8 (L)  Globulin, Total 2.2 - 3.9 g/dL 2.6 (C)  A/G Ratio 0.7 - 1.7  1.1 (C)  Alpha-1-Globulin 0.0 - 0.4 g/dL 0.2  Joeyj-7-Honalopw 0.4 - 1.0 g/dL 0.6  Beta Globulin 0.7 - 1.3 g/dL 1.1  Gamma Globulin 0.4 - 1.8 g/dL 0.7  M-SPIKE, % Not Observed g/dL 0.5 (H)  SPE Interp.  Comment  Comment  Comment  (L): Data is abnormally low (H): Data is abnormally high (C): Corrected   Latest Reference Range & Units 09/07/24 10:57  Kappa free light chain 3.3 - 19.4 mg/L 102.6 (H)  Lambda free light chains 5.7 - 26.3 mg/L 22.2  Kappa, lambda light chain  ratio 0.26 - 1.65  4.62 (H)  (H): Data is abnormally high  RADIOGRAPHIC STUDIES: I have personally reviewed the radiological images as listed and agreed with the findings in the report.  NM PET Image Restage (PS) Skull Base to Thigh (F-18 FDG) CLINICAL DATA:  Subsequent treatment strategy for multiple myeloma.  EXAM: NUCLEAR MEDICINE PET SKULL BASE TO THIGH  TECHNIQUE: 6.8 mCi F-18 FDG was injected intravenously. Full-ring PET imaging was performed from the skull base to thigh after  the radiotracer. CT data was obtained and used for attenuation correction and anatomic localization.  Fasting blood glucose: 110 mg/dl  COMPARISON:  None Available.  FINDINGS: NECK: No hypermetabolic lymph nodes in the neck.  Incidental CT findings: None.  CHEST: No hypermetabolic mediastinal or hilar nodes. No suspicious pulmonary nodules on the CT scan.  Incidental CT findings: Postsurgical change in LEFT breast  ABDOMEN/PELVIS: No abnormal hypermetabolic activity within the liver, pancreas, adrenal glands, or spleen. No hypermetabolic lymph nodes in the abdomen or pelvis.  No plasmacytoma  Incidental CT findings: Atherosclerotic calcification of the aorta.  SKELETON: No focal hypermetabolic activity to suggest skeletal metastasis.  Incidental CT findings: No suspicious lytic lesions on CT portion exam.  IMPRESSION: 1. No evidence of multiple myeloma on FDG PET scan. 2. No evidence of plasmacytoma. 3. Postsurgical change in the LEFT breast. 4.  Aortic Atherosclerosis (ICD10-I70.0).  Electronically Signed   By: Jackquline Boxer M.D.   On: 09/18/2024 14:20

## 2024-09-22 ENCOUNTER — Other Ambulatory Visit: Payer: Self-pay

## 2024-09-26 ENCOUNTER — Other Ambulatory Visit: Payer: Self-pay

## 2024-09-26 DIAGNOSIS — C9000 Multiple myeloma not having achieved remission: Secondary | ICD-10-CM

## 2024-09-26 MED ORDER — LENALIDOMIDE 10 MG PO CAPS: 10.0000 mg | ORAL_CAPSULE | Freq: Every day | ORAL | 0 refills | Status: DC

## 2024-09-26 NOTE — Telephone Encounter (Signed)
 Chart reviewed. Revlimid  refilled per last office note with Dr. Davonna.

## 2024-09-29 ENCOUNTER — Encounter (HOSPITAL_COMMUNITY): Payer: Self-pay | Admitting: Oncology

## 2024-09-29 ENCOUNTER — Encounter: Payer: Self-pay | Admitting: Oncology

## 2024-10-05 ENCOUNTER — Inpatient Hospital Stay: Attending: Hematology

## 2024-10-05 ENCOUNTER — Inpatient Hospital Stay

## 2024-10-05 ENCOUNTER — Encounter: Payer: Self-pay | Admitting: Oncology

## 2024-10-05 VITALS — BP 166/61 | HR 58 | Temp 98.0°F | Resp 16 | Wt 127.0 lb

## 2024-10-05 DIAGNOSIS — D649 Anemia, unspecified: Secondary | ICD-10-CM | POA: Diagnosis not present

## 2024-10-05 DIAGNOSIS — C9000 Multiple myeloma not having achieved remission: Secondary | ICD-10-CM

## 2024-10-05 DIAGNOSIS — Z5112 Encounter for antineoplastic immunotherapy: Secondary | ICD-10-CM | POA: Insufficient documentation

## 2024-10-05 DIAGNOSIS — Z79899 Other long term (current) drug therapy: Secondary | ICD-10-CM | POA: Insufficient documentation

## 2024-10-05 LAB — CBC WITH DIFFERENTIAL/PLATELET
Abs Immature Granulocytes: 0.02 K/uL (ref 0.00–0.07)
Basophils Absolute: 0 K/uL (ref 0.0–0.1)
Basophils Relative: 1 %
Eosinophils Absolute: 0.2 K/uL (ref 0.0–0.5)
Eosinophils Relative: 9 %
HCT: 32.6 % — ABNORMAL LOW (ref 36.0–46.0)
Hemoglobin: 10.2 g/dL — ABNORMAL LOW (ref 12.0–15.0)
Immature Granulocytes: 1 %
Lymphocytes Relative: 33 %
Lymphs Abs: 0.9 K/uL (ref 0.7–4.0)
MCH: 32.5 pg (ref 26.0–34.0)
MCHC: 31.3 g/dL (ref 30.0–36.0)
MCV: 103.8 fL — ABNORMAL HIGH (ref 80.0–100.0)
Monocytes Absolute: 0.3 K/uL (ref 0.1–1.0)
Monocytes Relative: 10 %
Neutro Abs: 1.3 K/uL — ABNORMAL LOW (ref 1.7–7.7)
Neutrophils Relative %: 46 %
Platelets: 187 K/uL (ref 150–400)
RBC: 3.14 MIL/uL — ABNORMAL LOW (ref 3.87–5.11)
RDW: 19.1 % — ABNORMAL HIGH (ref 11.5–15.5)
WBC: 2.8 K/uL — ABNORMAL LOW (ref 4.0–10.5)
nRBC: 0 % (ref 0.0–0.2)

## 2024-10-05 LAB — COMPREHENSIVE METABOLIC PANEL WITH GFR
ALT: 6 U/L (ref 0–44)
AST: 14 U/L — ABNORMAL LOW (ref 15–41)
Albumin: 3.1 g/dL — ABNORMAL LOW (ref 3.5–5.0)
Alkaline Phosphatase: 149 U/L — ABNORMAL HIGH (ref 38–126)
Anion gap: 12 (ref 5–15)
BUN: 13 mg/dL (ref 8–23)
CO2: 22 mmol/L (ref 22–32)
Calcium: 8.9 mg/dL (ref 8.9–10.3)
Chloride: 107 mmol/L (ref 98–111)
Creatinine, Ser: 1.08 mg/dL — ABNORMAL HIGH (ref 0.44–1.00)
GFR, Estimated: 50 mL/min — ABNORMAL LOW (ref >60)
Glucose, Bld: 86 mg/dL (ref 70–99)
Potassium: 4 mmol/L (ref 3.5–5.1)
Sodium: 142 mmol/L (ref 135–145)
Total Bilirubin: 0.7 mg/dL (ref 0.0–1.2)
Total Protein: 5.8 g/dL — ABNORMAL LOW (ref 6.5–8.1)

## 2024-10-05 LAB — MAGNESIUM: Magnesium: 1.9 mg/dL (ref 1.7–2.4)

## 2024-10-05 MED ORDER — PROCHLORPERAZINE MALEATE 10 MG PO TABS
10.0000 mg | ORAL_TABLET | Freq: Once | ORAL | Status: AC
  Administered 2024-10-05: 10 mg via ORAL
  Filled 2024-10-05: qty 1

## 2024-10-05 MED ORDER — DEXAMETHASONE 4 MG PO TABS
20.0000 mg | ORAL_TABLET | Freq: Once | ORAL | Status: AC
  Administered 2024-10-05: 20 mg via ORAL
  Filled 2024-10-05: qty 5

## 2024-10-05 MED ORDER — BORTEZOMIB CHEMO SQ INJECTION 3.5 MG (2.5MG/ML)
1.3000 mg/m2 | Freq: Once | INTRAMUSCULAR | Status: AC
  Administered 2024-10-05: 2.25 mg via SUBCUTANEOUS
  Filled 2024-10-05: qty 0.9

## 2024-10-05 NOTE — Patient Instructions (Signed)
 [facility] [place] - A DEPT OF [facility]  Discharge Instructions: Thank you for choosing [facility] to provide your oncology and hematology care.  If you have a lab appointment with the Cancer Center - please note that after April 8th, 2024, all labs will be drawn in the cancer center.  You do not have to check in or register with the main entrance as you have in the past but will complete your check-in in the cancer center.  Wear comfortable clothing and clothing appropriate for easy access to any Portacath or PICC line.   We strive to give you quality time with your provider. You may need to reschedule your appointment if you arrive late (15 or more minutes).  Arriving late affects you and other patients whose appointments are after yours.  Also, if you miss three or more appointments without notifying the office, you may be dismissed from the clinic at the provider's discretion.      For prescription refill requests, have your pharmacy contact our office and allow 72 hours for refills to be completed.    Today you received the following chemotherapy and/or immunotherapy agents bortezomib    To help prevent nausea and vomiting after your treatment, we encourage you to take your nausea medication as directed.  BELOW ARE SYMPTOMS THAT SHOULD BE REPORTED IMMEDIATELY: *FEVER GREATER THAN 100.4 F (38 C) OR HIGHER *CHILLS OR SWEATING *NAUSEA AND VOMITING THAT IS NOT CONTROLLED WITH YOUR NAUSEA MEDICATION *UNUSUAL SHORTNESS OF BREATH *UNUSUAL BRUISING OR BLEEDING *URINARY PROBLEMS (pain or burning when urinating, or frequent urination) *BOWEL PROBLEMS (unusual diarrhea, constipation, pain near the anus) TENDERNESS IN MOUTH AND THROAT WITH OR WITHOUT PRESENCE OF ULCERS (sore throat, sores in mouth, or a toothache) UNUSUAL RASH, SWELLING OR PAIN  UNUSUAL VAGINAL DISCHARGE OR ITCHING   Items with * indicate a potential emergency and should be followed up  as soon as possible or go to the Emergency Department if any problems should occur.  Please show the CHEMOTHERAPY ALERT CARD or IMMUNOTHERAPY ALERT CARD at check-in to the Emergency Department and triage nurse.  Should you have questions after your visit or need to cancel or reschedule your appointment, please contact [facility] [place] - A DEPT OF [name] [phone]  and follow the prompts.  Office hours are 8:00 a.m. to 4:30 p.m. Monday - Friday. Please note that voicemails left after 4:00 p.m. may not be returned until the following business day.  We are closed weekends and major holidays. You have access to a nurse at all times for urgent questions. Please call the main number to the clinic [phone] and follow the prompts.  For any non-urgent questions, you may also contact your provider using MyChart. We now offer e-Visits for anyone 69 and older to request care online for non-urgent symptoms. For details visit [email or web address].   Also download the MyChart app! Go to the app store, search MyChart, open the app, select [facility], and log in with your MyChart username and password.

## 2024-10-05 NOTE — Progress Notes (Signed)
 Labs reviewed , ok to treat with ANC of 1.3  Treatment given per orders. Patient tolerated it well without problems. Vitals stable and discharged home from clinic ambulatory. Follow up as scheduled.

## 2024-10-09 ENCOUNTER — Other Ambulatory Visit: Payer: Self-pay | Admitting: *Deleted

## 2024-10-09 MED ORDER — ANASTROZOLE 1 MG PO TABS: 1.0000 mg | ORAL_TABLET | Freq: Every day | ORAL | 6 refills | Status: DC

## 2024-10-12 ENCOUNTER — Other Ambulatory Visit: Payer: Self-pay | Admitting: Oncology

## 2024-10-12 DIAGNOSIS — C9000 Multiple myeloma not having achieved remission: Secondary | ICD-10-CM

## 2024-10-13 ENCOUNTER — Other Ambulatory Visit: Payer: Self-pay

## 2024-10-19 ENCOUNTER — Inpatient Hospital Stay

## 2024-10-19 ENCOUNTER — Encounter: Payer: Self-pay | Admitting: Oncology

## 2024-10-19 ENCOUNTER — Inpatient Hospital Stay (HOSPITAL_BASED_OUTPATIENT_CLINIC_OR_DEPARTMENT_OTHER): Admitting: Oncology

## 2024-10-19 VITALS — BP 106/61 | HR 74 | Temp 97.3°F | Resp 17 | Wt 128.0 lb

## 2024-10-19 DIAGNOSIS — C50212 Malignant neoplasm of upper-inner quadrant of left female breast: Secondary | ICD-10-CM | POA: Diagnosis not present

## 2024-10-19 DIAGNOSIS — Z170 Estrogen receptor positive status [ER+]: Secondary | ICD-10-CM

## 2024-10-19 DIAGNOSIS — Z5112 Encounter for antineoplastic immunotherapy: Secondary | ICD-10-CM | POA: Diagnosis not present

## 2024-10-19 DIAGNOSIS — C9000 Multiple myeloma not having achieved remission: Secondary | ICD-10-CM | POA: Diagnosis not present

## 2024-10-19 DIAGNOSIS — D649 Anemia, unspecified: Secondary | ICD-10-CM

## 2024-10-19 DIAGNOSIS — M879 Osteonecrosis, unspecified: Secondary | ICD-10-CM

## 2024-10-19 LAB — CBC WITH DIFFERENTIAL/PLATELET
Abs Immature Granulocytes: 0.01 K/uL (ref 0.00–0.07)
Basophils Absolute: 0.1 K/uL (ref 0.0–0.1)
Basophils Relative: 2 %
Eosinophils Absolute: 0.1 K/uL (ref 0.0–0.5)
Eosinophils Relative: 3 %
HCT: 32.7 % — ABNORMAL LOW (ref 36.0–46.0)
Hemoglobin: 10.3 g/dL — ABNORMAL LOW (ref 12.0–15.0)
Immature Granulocytes: 0 %
Lymphocytes Relative: 31 %
Lymphs Abs: 1.1 K/uL (ref 0.7–4.0)
MCH: 32.4 pg (ref 26.0–34.0)
MCHC: 31.5 g/dL (ref 30.0–36.0)
MCV: 102.8 fL — ABNORMAL HIGH (ref 80.0–100.0)
Monocytes Absolute: 0.2 K/uL (ref 0.1–1.0)
Monocytes Relative: 5 %
Neutro Abs: 2.1 K/uL (ref 1.7–7.7)
Neutrophils Relative %: 59 %
Platelets: 200 K/uL (ref 150–400)
RBC: 3.18 MIL/uL — ABNORMAL LOW (ref 3.87–5.11)
RDW: 18.6 % — ABNORMAL HIGH (ref 11.5–15.5)
WBC: 3.6 K/uL — ABNORMAL LOW (ref 4.0–10.5)
nRBC: 0 % (ref 0.0–0.2)

## 2024-10-19 LAB — MAGNESIUM: Magnesium: 1.9 mg/dL (ref 1.7–2.4)

## 2024-10-19 LAB — FOLATE: Folate: 6.8 ng/mL (ref >5.9)

## 2024-10-19 LAB — VITAMIN B12: Vitamin B-12: 740 pg/mL (ref 180–914)

## 2024-10-19 LAB — COMPREHENSIVE METABOLIC PANEL WITH GFR
ALT: 7 U/L (ref 0–44)
AST: 15 U/L (ref 15–41)
Albumin: 3.2 g/dL — ABNORMAL LOW (ref 3.5–5.0)
Alkaline Phosphatase: 149 U/L — ABNORMAL HIGH (ref 38–126)
Anion gap: 10 (ref 5–15)
BUN: 14 mg/dL (ref 8–23)
CO2: 25 mmol/L (ref 22–32)
Calcium: 9 mg/dL (ref 8.9–10.3)
Chloride: 104 mmol/L (ref 98–111)
Creatinine, Ser: 1.2 mg/dL — ABNORMAL HIGH (ref 0.44–1.00)
GFR, Estimated: 44 mL/min — ABNORMAL LOW (ref >60)
Glucose, Bld: 100 mg/dL — ABNORMAL HIGH (ref 70–99)
Potassium: 4.6 mmol/L (ref 3.5–5.1)
Sodium: 139 mmol/L (ref 135–145)
Total Bilirubin: 0.8 mg/dL (ref 0.0–1.2)
Total Protein: 5.7 g/dL — ABNORMAL LOW (ref 6.5–8.1)

## 2024-10-19 LAB — IRON AND TIBC: Iron: 46 ug/dL (ref 28–170)

## 2024-10-19 LAB — FERRITIN: Ferritin: 1021 ng/mL — ABNORMAL HIGH (ref 11–307)

## 2024-10-19 MED ORDER — DEXAMETHASONE 4 MG PO TABS
20.0000 mg | ORAL_TABLET | Freq: Once | ORAL | Status: AC
  Administered 2024-10-19: 20 mg via ORAL
  Filled 2024-10-19: qty 5

## 2024-10-19 MED ORDER — BORTEZOMIB CHEMO SQ INJECTION 3.5 MG (2.5MG/ML)
1.3000 mg/m2 | Freq: Once | INTRAMUSCULAR | Status: AC
  Administered 2024-10-19: 2.25 mg via SUBCUTANEOUS
  Filled 2024-10-19: qty 0.9

## 2024-10-19 MED ORDER — PROCHLORPERAZINE MALEATE 10 MG PO TABS
10.0000 mg | ORAL_TABLET | Freq: Once | ORAL | Status: AC
  Administered 2024-10-19: 10 mg via ORAL
  Filled 2024-10-19: qty 1

## 2024-10-19 NOTE — Progress Notes (Signed)
 Patient tolerated Velcade injection with no complaints voiced.  Lab work reviewed.  See MAR for details.  Injection site clean and dry with no bruising or swelling noted.  Patient stable during and after injection.  Band aid applied.  VSS.  Patient left in satisfactory condition with no s/s of distress noted.

## 2024-10-19 NOTE — Patient Instructions (Signed)

## 2024-10-19 NOTE — Progress Notes (Signed)
 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 Considering patient's worsening multiple myeloma, patient was started on anastrozole .  Lumpectomy and radiation were deferred at this time.  -Continue anastrozole  1 mg daily.  Patient reports no side effects from it - No evidence of metastasis on PET scan  IgA kappa multiple myeloma, standard risk Extensive oncology history below Patient has been on Revlimid  and Velcade  since 2019. Patient was recently admitted in the hospital for compression fracture  -Continue Velcade  1.3 mg/m every 2 weeks, continue Revlimid  10 mg 3 weeks on/1 week off - Labs reviewed today: CMP: Creatinine: 1.20-stable, elevated alkaline phosphatase at 149, normal LFTs.  CBC: WBC: 3.6, hemoglobin: 10.3, platelets: 200.  SPEP: M spike: 0.5, free kappa light chain: 102.6, free lambda light chain: 22.2, ratio: 4.62-stable - Physical exam stable today.  Will proceed with treatment today. - Recent PET scan with no evidence of multiple myeloma. - If patient has worsening multiple myeloma, can consider adding daratumumab.  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  Will continue to monitor at this  time.  Uncontrolled back pain From compression fractures  -Continue to brace -Continue pain medications as prescribed   The patient understands the plans discussed today and is in agreement with them.  She knows to contact our office if she develops concerns prior to her next appointment.  The total time spent in the appointment was 30 minutes for the encounter  with patient, including review of chart and various tests results, discussions about plan of care and coordination of care plan  Brittany Dry, MD  [facility] [place] - A DEPT OF [facility] [address] [place] [address] Dept: [phone] Dept Fax: [phone]   No orders of the defined types were placed in this encounter.    ONCOLOGY HISTORY:   I have reviewed her chart and materials related to her cancer extensively and collaborated history with the patient. Summary of oncologic history is as follows:   Diagnosis: IgA kappa multiple myeloma, standard risk   -11/25/2017: MM Panel: IgA 897. M-spike 0.9. Total globulin 4.0. Beta globulin 1.7.  -11/25/2017: Myeloma Labs: Elevated kappa free light chains at 2,048.9 with FLC ratio at 123. 43. HGB low at 9.4. Creatinine elevated at 1.09. Calcium  elevated at 10.5.  -01/03/2018: Bone Marrow Biopsy.  Pathology: Hypercellular bone marrow for the age with plasma cell neoplasm. The bone marrow shows increased number of atypical plasma cells representing 60% of all cells in the aspirate associated with interstitial infiltrates and numerous variably sized clusters in the clot and biopsy sections. IHC highlight the increased plasma cell component in the marrow, which shows kappa light chain restriction consistent with plasma cell neoplasm. -  Cytogenetics: Abnormal female karyotype.  -FISH: Normal. -01/09/2018-current: RVD with velcade  1.3mg /m2 every 2 weeks and Revlimid  10 mg 3 weeks on/1 week  off -01/16/2018-02/07/2020: Denosumab , Discontinued for osteonecrosis of jaw -02/02/2018: Beta-2  microglobulin: 2.4 -02/16/2018: Bone Survey: Subtle patchy areas of osteopenia of the thoracolumbar spine and possibly distal right clavicle that may reflect subtle changes of multiple myeloma. Otherwise negative. -02/18/2018: Positive for proteinuria with elevated kappa light chains.  -02/20/2020: MRI Face Trigeminal: Inflammatory changes along the buccal and lingual aspects of the body and angle of the mandible on the right. There is abnormal signal of the adjacent mandible corresponding to sclerosis on CT likely reflecting sequelae of chronic inflammation. Mild marrow enhancement at the level of the mandibular foramen could reflect an area of more acute osteomyelitis. More focal soft tissue thickening and fluid in the right submental region likely reflecting phlegmon/developing multilocular abscess. -03/19/2021: Debridement of right mandible.   Pathology: Consistent with clinical impression of osteonecrosis.  -2024-2025: PET scans show NED   Diagnosis: Breast Cancer  -07/19/2007: Left Diagnostic Mammogram: Calcifications.  -07/26/2007: Left breast biopsy.  Pathology: DCIS with necrosis, intermediate and focally high grade. Tumor cells are ER positive (98%) and PR positive (52%).  -08/16/2007: Left lumpectomy. Pathology : Intermediate grade DCIS with calcifications, 0.7 cm.  -2008: Radiation therapy to left breast  -2008-12/15/2023: Patient was under surveillance with mammograms showing NED -12/16/2023: PET: There is a tracer avid soft tissue lesion within the periareolar region of the left breast measuring 1.9 cm with SUV max of 6.10. This is concerning for primary breast neoplasm. There are several morphologically benign appearing lymph nodes within the right axilla which exhibit mild increased tracer uptake. There is a mild tracer avid lymph node within the left axilla which measures 5 mm with  SUV max of 2.3. -03/09/2024: Bilateral diagnostic mammogram and US : Suspicious palpable 2.2 cm mass in the left breast at 9 o'clock retroareolar. Suspicious 0.7 cm mass/probable lymph node in the left axilla. Suspicious segmental calcifications involving the outer right breast spanning 8.6 cm. -03/24/2024: Left breast retroareolar biopsy at the 9 o'clock position.  Pathology: Invasive ductal carcinoma, 1.3 cm, with intermediate grade DCIS, cribriform type. Lymphovascular invasion present. Calcifications present.  Tumor cells are ER positive (95%), PR positive (60%), and Her2 negative (0). Ki-67 15%. -03/24/2024: Left axillary lymph node biopsy.  Pathology: Ductal carcinoma involving adipose tissue consistent with metastatic carcinoma.  -03/16/2024: Right breast outer posterior and anterior calcifications biopsies.  Pathology of posterior calcifications: Intermediate grade DCIS, cribriform and micropapillary types, spanning 1.2 cm.  Pathology of anterior calcifications: Invasive ductal carcinoma spanning 0.3 cm with intermediate grade DCIS, cribriform and micropapillary types. DCIS length is 1.3 cm. Tumor cells are ER positive (100%), PR positive (60%), and Her2 negative (1+). Ki-67 10%. -03/30/2024-current: Anastrozole  -04/20/2024: Bone Density scan: T-score of -1.3 (osteopenia) -Management of breast cancer put on back burner due to patient's worsening myeloma  Current Treatment:  Velcade  + Revlimid  for multiple myeloma, Anastrozole  1mg  daily for Breast carcinoma  INTERVAL HISTORY:   Discussed the use of AI scribe software for clinical note transcription with the patient, who gave verbal consent to proceed.  History of Present Illness Brittany Archer is an 83 year old female with breast cancer and multiple myeloma who presents for routine follow-up.  She was unaccompanied today.  She is currently on Revlimid  for multiple myeloma, taken for three weeks with a one-week break. There is  uncertainty about her adherence to anastrozole  for breast cancer, as her niece  manages her medication regimen.  No current pain, numbness, or tingling in her hands and feet. No hot flashes, joint or finger pains, or diarrhea. She uses a back brace for pain control.   I have reviewed the past medical history, past surgical history, social history and family history with the patient and they are unchanged from previous note.  ALLERGIES:  is allergic to motrin [ibuprofen] and seasonal ic [cholestatin].  MEDICATIONS:  Current Outpatient Medications  Medication Sig Dispense Refill   acetaminophen  (TYLENOL ) 325 MG tablet Take 2 tablets (650 mg total) by mouth in the morning, at noon, and at bedtime.     anastrozole  (ARIMIDEX ) 1 MG tablet Take 1 tablet (1 mg total) by mouth daily. 30 tablet 6   ASPIRIN  LOW DOSE 81 MG tablet Take 81 mg by mouth daily.     atorvastatin  (LIPITOR) 10 MG tablet Take 10 mg by mouth daily.     bortezomib  IV (VELCADE ) 3.5 MG injection Inject 3.5 mg into the vein every Thursday.     famotidine  (PEPCID ) 20 MG tablet Take 20 mg by mouth at bedtime as needed for heartburn or indigestion.     glucose blood (ACCU-CHEK AVIVA PLUS) test strip CHECK BLOOD SUGAR ONCE DAILY     lenalidomide  (REVLIMID ) 10 MG capsule Take 1 capsule (10 mg total) by mouth daily. Take daily for 21 days, with 7 days off 21 capsule 0   lidocaine  (LIDODERM ) 5 % Place 2 patches onto the skin daily. Remove & Discard patch within 12 hours or as directed by MD     magnesium  oxide (MAG-OX) 400 (240 Mg) MG tablet Take 1 tablet (400 mg total) by mouth 3 (three) times daily.     memantine  (NAMENDA ) 5 MG tablet Take 5 mg by mouth daily.     metFORMIN  (GLUCOPHAGE ) 500 MG tablet Take 500 mg by mouth daily.     mirtazapine  (REMERON ) 7.5 MG tablet Take 7.5 mg by mouth at bedtime.     oxyCODONE  (OXY IR/ROXICODONE ) 5 MG immediate release tablet Take 1 tablet (5 mg total) by mouth every 6 (six) hours as needed for severe  pain (pain score 7-10). 12 tablet 0   pantoprazole  (PROTONIX ) 40 MG tablet Take 1 tablet (40 mg total) by mouth daily.     potassium chloride  (KLOR-CON ) 20 MEQ packet Take 20 mEq by mouth 2 (two) times daily.     senna (SENOKOT) 8.6 MG TABS tablet Take 1 tablet (8.6 mg total) by mouth at bedtime.     No current facility-administered medications for this visit.    REVIEW OF SYSTEMS:   Constitutional: Denies fevers, chills or abnormal weight loss Eyes: Denies blurriness of vision Ears, nose, mouth, throat, and face: Denies mucositis or sore throat Respiratory: Denies cough, dyspnea or wheezes Cardiovascular: Denies palpitation, chest discomfort or lower extremity swelling Gastrointestinal:  Denies nausea, heartburn or change in bowel habits Skin: Denies abnormal skin rashes Lymphatics: Denies new lymphadenopathy or easy bruising Neurological:Denies numbness, tingling or new weaknesses Behavioral/Psych: Mood is stable, no new changes  All other systems were reviewed with the patient and are negative.   VITALS:  Blood pressure 106/61, pulse 74, temperature (!) 97.3 F (36.3 C), temperature source Tympanic, resp. rate 17, weight 128 lb (58.1 kg), SpO2 100%.  Wt Readings from Last 3 Encounters:  10/19/24 128 lb (58.1 kg)  10/05/24 126 lb 15.8 oz (57.6 kg)  09/21/24 134 lb 0.6 oz (60.8 kg)    Body mass index is 21.97 kg/m.  Performance status (ECOG): 3 - Symptomatic, >50% confined to bed  PHYSICAL EXAM:   GENERAL:alert, no distress and comfortable, in a wheelchair and a back brace.  Examination limited by back brace. LYMPH:  no palpable lymphadenopathy in the cervical, axillary or inguinal LUNGS: clear to auscultation and percussion with normal breathing effort HEART: regular rate & rhythm and no murmurs and no lower extremity edema ABDOMEN:abdomen soft, non-tender and normal bowel sounds Musculoskeletal:no cyanosis of digits and no clubbing  NEURO: alert & oriented x 3 with  fluent speech  LABORATORY DATA:  I have reviewed the data as listed   Lab Results  Component Value Date   WBC 3.6 (L) 10/19/2024   NEUTROABS 2.1 10/19/2024   HGB 10.3 (L) 10/19/2024   HCT 32.7 (L) 10/19/2024   MCV 102.8 (H) 10/19/2024   PLT 200 10/19/2024     Chemistry      Component Value Date/Time   NA 139 10/19/2024 0817   K 4.6 10/19/2024 0817   CL 104 10/19/2024 0817   CO2 25 10/19/2024 0817   BUN 14 10/19/2024 0817   CREATININE 1.20 (H) 10/19/2024 0817      Component Value Date/Time   CALCIUM  9.0 10/19/2024 0817   ALKPHOS 149 (H) 10/19/2024 0817   AST 15 10/19/2024 0817   ALT 7 10/19/2024 0817   BILITOT 0.8 10/19/2024 0817      Latest Reference Range & Units 09/07/24 10:57  Total Protein ELP 6.0 - 8.5 g/dL 5.4 (L)  Albumin ELP 2.9 - 4.4 g/dL 2.8 (L)  Globulin, Total 2.2 - 3.9 g/dL 2.6 (C)  A/G Ratio 0.7 - 1.7  1.1 (C)  Alpha-1-Globulin 0.0 - 0.4 g/dL 0.2  Joeyj-7-Honalopw 0.4 - 1.0 g/dL 0.6  Beta Globulin 0.7 - 1.3 g/dL 1.1  Gamma Globulin 0.4 - 1.8 g/dL 0.7  M-SPIKE, % Not Observed g/dL 0.5 (H)  SPE Interp.  Comment  Comment  Comment  (L): Data is abnormally low (H): Data is abnormally high (C): Corrected   Latest Reference Range & Units 09/07/24 10:57  Kappa free light chain 3.3 - 19.4 mg/L 102.6 (H)  Lambda free light chains 5.7 - 26.3 mg/L 22.2  Kappa, lambda light chain ratio 0.26 - 1.65  4.62 (H)  (H): Data is abnormally high  RADIOGRAPHIC STUDIES: I have personally reviewed the radiological images as listed and agreed with the findings in the report.  NM PET Image Restage (PS) Skull Base to Thigh (F-18 FDG) CLINICAL DATA:  Subsequent treatment strategy for multiple myeloma.  EXAM: NUCLEAR MEDICINE PET SKULL BASE TO THIGH  TECHNIQUE: 6.8 mCi F-18 FDG was injected intravenously. Full-ring PET imaging was performed from the skull base to thigh after the radiotracer. CT data was obtained and used for attenuation correction and  anatomic localization.  Fasting blood glucose: 110 mg/dl  COMPARISON:  None Available.  FINDINGS: NECK: No hypermetabolic lymph nodes in the neck.  Incidental CT findings: None.  CHEST: No hypermetabolic mediastinal or hilar nodes. No suspicious pulmonary nodules on the CT scan.  Incidental CT findings: Postsurgical change in LEFT breast  ABDOMEN/PELVIS: No abnormal hypermetabolic activity within the liver, pancreas, adrenal glands, or spleen. No hypermetabolic lymph nodes in the abdomen or pelvis.  No plasmacytoma  Incidental CT findings: Atherosclerotic calcification of the aorta.  SKELETON: No focal hypermetabolic activity to suggest skeletal metastasis.  Incidental CT findings: No suspicious lytic lesions on CT portion exam.  IMPRESSION: 1. No evidence of multiple myeloma on FDG PET scan. 2. No evidence  of plasmacytoma. 3. Postsurgical change in the LEFT breast. 4.  Aortic Atherosclerosis (ICD10-I70.0).  Electronically Signed   By: Jackquline Boxer M.D.   On: 09/18/2024 14:20

## 2024-10-19 NOTE — Patient Instructions (Signed)
 [facility] [place] - A DEPT OF [facility]  Discharge Instructions: Thank you for choosing [facility] to provide your oncology and hematology care.  If you have a lab appointment with the Cancer Center - please note that after April 8th, 2024, all labs will be drawn in the cancer center.  You do not have to check in or register with the main entrance as you have in the past but will complete your check-in in the cancer center.  Wear comfortable clothing and clothing appropriate for easy access to any Portacath or PICC line.   We strive to give you quality time with your provider. You may need to reschedule your appointment if you arrive late (15 or more minutes).  Arriving late affects you and other patients whose appointments are after yours.  Also, if you miss three or more appointments without notifying the office, you may be dismissed from the clinic at the provider's discretion.      For prescription refill requests, have your pharmacy contact our office and allow 72 hours for refills to be completed.    Today you received the following chemotherapy and/or immunotherapy agents velcade .       To help prevent nausea and vomiting after your treatment, we encourage you to take your nausea medication as directed.  BELOW ARE SYMPTOMS THAT SHOULD BE REPORTED IMMEDIATELY: *FEVER GREATER THAN 100.4 F (38 C) OR HIGHER *CHILLS OR SWEATING *NAUSEA AND VOMITING THAT IS NOT CONTROLLED WITH YOUR NAUSEA MEDICATION *UNUSUAL SHORTNESS OF BREATH *UNUSUAL BRUISING OR BLEEDING *URINARY PROBLEMS (pain or burning when urinating, or frequent urination) *BOWEL PROBLEMS (unusual diarrhea, constipation, pain near the anus) TENDERNESS IN MOUTH AND THROAT WITH OR WITHOUT PRESENCE OF ULCERS (sore throat, sores in mouth, or a toothache) UNUSUAL RASH, SWELLING OR PAIN  UNUSUAL VAGINAL DISCHARGE OR ITCHING   Items with * indicate a potential emergency and should be followed up  as soon as possible or go to the Emergency Department if any problems should occur.  Please show the CHEMOTHERAPY ALERT CARD or IMMUNOTHERAPY ALERT CARD at check-in to the Emergency Department and triage nurse.  Should you have questions after your visit or need to cancel or reschedule your appointment, please contact [facility] [place] - A DEPT OF [name] [phone]  and follow the prompts.  Office hours are 8:00 a.m. to 4:30 p.m. Monday - Friday. Please note that voicemails left after 4:00 p.m. may not be returned until the following business day.  We are closed weekends and major holidays. You have access to a nurse at all times for urgent questions. Please call the main number to the clinic [phone] and follow the prompts.  For any non-urgent questions, you may also contact your provider using MyChart. We now offer e-Visits for anyone 8 and older to request care online for non-urgent symptoms. For details visit [email or web address].   Also download the MyChart app! Go to the app store, search MyChart, open the app, select [facility], and log in with your MyChart username and password.

## 2024-10-20 ENCOUNTER — Other Ambulatory Visit: Payer: Self-pay

## 2024-10-22 ENCOUNTER — Other Ambulatory Visit: Payer: Self-pay

## 2024-10-25 ENCOUNTER — Other Ambulatory Visit: Payer: Self-pay

## 2024-10-25 ENCOUNTER — Emergency Department (HOSPITAL_COMMUNITY)

## 2024-10-25 ENCOUNTER — Telehealth: Payer: Self-pay | Admitting: *Deleted

## 2024-10-25 ENCOUNTER — Observation Stay (HOSPITAL_COMMUNITY)
Admission: EM | Admit: 2024-10-25 | Discharge: 2024-10-30 | Disposition: A | Discharge status: Home / Self Care | Attending: Family Medicine | Admitting: Family Medicine

## 2024-10-25 ENCOUNTER — Encounter (HOSPITAL_COMMUNITY): Payer: Self-pay

## 2024-10-25 DIAGNOSIS — F0390 Unspecified dementia without behavioral disturbance: Secondary | ICD-10-CM | POA: Insufficient documentation

## 2024-10-25 DIAGNOSIS — D61818 Other pancytopenia: Secondary | ICD-10-CM | POA: Diagnosis not present

## 2024-10-25 DIAGNOSIS — C50912 Malignant neoplasm of unspecified site of left female breast: Secondary | ICD-10-CM | POA: Insufficient documentation

## 2024-10-25 DIAGNOSIS — R531 Weakness: Secondary | ICD-10-CM

## 2024-10-25 DIAGNOSIS — C9000 Multiple myeloma not having achieved remission: Secondary | ICD-10-CM | POA: Insufficient documentation

## 2024-10-25 DIAGNOSIS — N3090 Cystitis, unspecified without hematuria: Principal | ICD-10-CM | POA: Insufficient documentation

## 2024-10-25 DIAGNOSIS — K219 Gastro-esophageal reflux disease without esophagitis: Secondary | ICD-10-CM | POA: Insufficient documentation

## 2024-10-25 DIAGNOSIS — K5730 Diverticulosis of large intestine without perforation or abscess without bleeding: Secondary | ICD-10-CM | POA: Insufficient documentation

## 2024-10-25 DIAGNOSIS — R627 Adult failure to thrive: Secondary | ICD-10-CM | POA: Diagnosis present

## 2024-10-25 DIAGNOSIS — D539 Nutritional anemia, unspecified: Secondary | ICD-10-CM

## 2024-10-25 DIAGNOSIS — E43 Unspecified severe protein-calorie malnutrition: Secondary | ICD-10-CM | POA: Insufficient documentation

## 2024-10-25 DIAGNOSIS — R319 Hematuria, unspecified: Secondary | ICD-10-CM

## 2024-10-25 DIAGNOSIS — I2510 Atherosclerotic heart disease of native coronary artery without angina pectoris: Secondary | ICD-10-CM | POA: Insufficient documentation

## 2024-10-25 DIAGNOSIS — Z853 Personal history of malignant neoplasm of breast: Secondary | ICD-10-CM

## 2024-10-25 DIAGNOSIS — N3000 Acute cystitis without hematuria: Principal | ICD-10-CM | POA: Diagnosis present

## 2024-10-25 DIAGNOSIS — N390 Urinary tract infection, site not specified: Secondary | ICD-10-CM | POA: Diagnosis not present

## 2024-10-25 DIAGNOSIS — G8929 Other chronic pain: Secondary | ICD-10-CM | POA: Insufficient documentation

## 2024-10-25 DIAGNOSIS — K529 Noninfective gastroenteritis and colitis, unspecified: Secondary | ICD-10-CM | POA: Insufficient documentation

## 2024-10-25 DIAGNOSIS — N1832 Chronic kidney disease, stage 3b: Secondary | ICD-10-CM

## 2024-10-25 DIAGNOSIS — E782 Mixed hyperlipidemia: Secondary | ICD-10-CM | POA: Insufficient documentation

## 2024-10-25 DIAGNOSIS — Z794 Long term (current) use of insulin: Secondary | ICD-10-CM | POA: Insufficient documentation

## 2024-10-25 DIAGNOSIS — E119 Type 2 diabetes mellitus without complications: Secondary | ICD-10-CM | POA: Insufficient documentation

## 2024-10-25 DIAGNOSIS — D649 Anemia, unspecified: Secondary | ICD-10-CM | POA: Diagnosis not present

## 2024-10-25 LAB — CBC WITH DIFFERENTIAL/PLATELET
Abs Immature Granulocytes: 0.03 K/uL (ref 0.00–0.07)
Basophils Absolute: 0 K/uL (ref 0.0–0.1)
Basophils Relative: 1 %
Eosinophils Absolute: 0.1 K/uL (ref 0.0–0.5)
Eosinophils Relative: 2 %
HCT: 30.8 % — ABNORMAL LOW (ref 36.0–46.0)
Hemoglobin: 9.7 g/dL — ABNORMAL LOW (ref 12.0–15.0)
Immature Granulocytes: 1 %
Lymphocytes Relative: 23 %
Lymphs Abs: 0.7 K/uL (ref 0.7–4.0)
MCH: 32.2 pg (ref 26.0–34.0)
MCHC: 31.5 g/dL (ref 30.0–36.0)
MCV: 102.3 fL — ABNORMAL HIGH (ref 80.0–100.0)
Monocytes Absolute: 0.3 K/uL (ref 0.1–1.0)
Monocytes Relative: 11 %
Neutro Abs: 1.8 K/uL (ref 1.7–7.7)
Neutrophils Relative %: 62 %
Platelets: 89 K/uL — ABNORMAL LOW (ref 150–400)
RBC: 3.01 MIL/uL — ABNORMAL LOW (ref 3.87–5.11)
RDW: 18.4 % — ABNORMAL HIGH (ref 11.5–15.5)
WBC: 2.9 K/uL — ABNORMAL LOW (ref 4.0–10.5)
nRBC: 0 % (ref 0.0–0.2)

## 2024-10-25 LAB — URINALYSIS, W/ REFLEX TO CULTURE (INFECTION SUSPECTED)
Bilirubin Urine: NEGATIVE
Glucose, UA: NEGATIVE mg/dL
Hgb urine dipstick: NEGATIVE
Ketones, ur: NEGATIVE mg/dL
Nitrite: POSITIVE — AB
Protein, ur: NEGATIVE mg/dL
Specific Gravity, Urine: 1.024 (ref 1.005–1.030)
pH: 5 (ref 5.0–8.0)

## 2024-10-25 LAB — COMPREHENSIVE METABOLIC PANEL WITH GFR
ALT: 9 U/L (ref 0–44)
AST: 16 U/L (ref 15–41)
Albumin: 3.1 g/dL — ABNORMAL LOW (ref 3.5–5.0)
Alkaline Phosphatase: 115 U/L (ref 38–126)
Anion gap: 11 (ref 5–15)
BUN: 18 mg/dL (ref 8–23)
CO2: 22 mmol/L (ref 22–32)
Calcium: 8.7 mg/dL — ABNORMAL LOW (ref 8.9–10.3)
Chloride: 106 mmol/L (ref 98–111)
Creatinine, Ser: 1.11 mg/dL — ABNORMAL HIGH (ref 0.44–1.00)
GFR, Estimated: 49 mL/min — ABNORMAL LOW (ref >60)
Glucose, Bld: 71 mg/dL (ref 70–99)
Potassium: 4.3 mmol/L (ref 3.5–5.1)
Sodium: 139 mmol/L (ref 135–145)
Total Bilirubin: 1 mg/dL (ref 0.0–1.2)
Total Protein: 5.2 g/dL — ABNORMAL LOW (ref 6.5–8.1)

## 2024-10-25 LAB — AMMONIA: Ammonia: <13 umol/L (ref 9–35)

## 2024-10-25 LAB — RESP PANEL BY RT-PCR (RSV, FLU A&B, COVID)  RVPGX2
Influenza A by PCR: NEGATIVE
Influenza B by PCR: NEGATIVE
Resp Syncytial Virus by PCR: NEGATIVE
SARS Coronavirus 2 by RT PCR: NEGATIVE

## 2024-10-25 LAB — TSH: TSH: 0.529 u[IU]/mL (ref 0.350–4.500)

## 2024-10-25 LAB — LIPASE, BLOOD: Lipase: 16 U/L (ref 11–51)

## 2024-10-25 MED ORDER — LENALIDOMIDE 10 MG PO CAPS: 10.0000 mg | ORAL_CAPSULE | Freq: Every day | ORAL | 0 refills | Status: DC

## 2024-10-25 MED ORDER — SODIUM CHLORIDE 0.9 % IV BOLUS
1000.0000 mL | Freq: Once | INTRAVENOUS | Status: AC
  Administered 2024-10-25: 1000 mL via INTRAVENOUS

## 2024-10-25 MED ORDER — SODIUM CHLORIDE 0.9 % IV SOLN
2.0000 g | Freq: Once | INTRAVENOUS | Status: AC
  Administered 2024-10-25: 2 g via INTRAVENOUS
  Filled 2024-10-25: qty 20

## 2024-10-25 MED ORDER — IOHEXOL 300 MG/ML  SOLN
100.0000 mL | Freq: Once | INTRAMUSCULAR | Status: AC | PRN
  Administered 2024-10-25: 100 mL via INTRAVENOUS

## 2024-10-25 NOTE — Telephone Encounter (Signed)
 Chart reviewed. Revlimid  refilled per last office note with Dr. Davonna.

## 2024-10-25 NOTE — ED Triage Notes (Signed)
 Pt BIB [facility] for failure to thrive. Pt generally weak, not eating or drinking. VS WNL

## 2024-10-25 NOTE — Telephone Encounter (Signed)
 Received call from Paramedic Jon Cork, who is in the home with patient.  Has not eaten or drank for several days, nor has she been out of the bed.  Cognition is declining and is extremely weak.  Will be transporting to [facility] for evaluation.

## 2024-10-25 NOTE — H&P (Signed)
 History and Physical    Patient: Brittany Archer FMW:[id number] DOB: 02-20-1941 DOA: 10/25/2024 DOS: the patient was seen and examined on 10/25/2024 PCP: Katrinka Aquas, MD  Patient coming from: {Point_of_Origin:[address]}  Chief Complaint:  Chief Complaint  Patient presents with   Failure To Thrive   HPI: Brittany Archer is a 83 y.o. female with medical history significant of ***  Review of Systems: {ROS_Text:26778} Past Medical History:  Diagnosis Date   Breast cancer (HCC)    left breast/ 2008/ surg/ rad tx   Coronary artery disease    Diabetes mellitus    Past Surgical History:  Procedure Laterality Date   ABDOMINAL HYSTERECTOMY     BREAST BIOPSY Left 03/24/2024   US  LT BREAST BX W LOC DEV 1ST LESION IMG BX SPEC US  GUIDE 03/24/2024 [facility]   BREAST BIOPSY Left 03/24/2024   US  LT BREAST BX W LOC DEV EA ADD LESION IMG BX SPEC US  GUIDE 03/24/2024 [facility]   BREAST BIOPSY Right 03/24/2024   MM RT BREAST BX W LOC DEV EA AD LESION IMG BX SPEC STEREO GUIDE 03/24/2024 [facility]   BREAST BIOPSY Right 03/24/2024   MM RT BREAST BX W LOC DEV 1ST LESION IMAGE BX SPEC STEREO GUIDE 03/24/2024 [facility]   BREAST SURGERY     DEBRIDEMENT MANDIBLE N/A 02/22/2020   Procedure: INCISION AND DRAINAGE WITH DEBRIDEMENT MANDIBLE;  Surgeon: Joanette Soulier, DMD;  Location: [facility];  Service: Oral Surgery;  Laterality: N/A;   DEBRIDEMENT MANDIBLE Right 03/19/2021   Procedure: DEBRIDEMENT OF BONE RIGHT INTERIOR  MANDIBLE;  Surgeon: Joanette Soulier, DMD;  Location: [facility];  Service: Oral Surgery;  Laterality: Right;   EYE SURGERY  2021   cataract removals    TOOTH EXTRACTION N/A 02/22/2020   Procedure: DENTAL RESTORATION/EXTRACTIONS;  Surgeon: Joanette Soulier, DMD;  Location: [facility];  Service: Oral Surgery;  Laterality: N/A;  DENTAL KIT REQUESTED   Social History:  reports that she has never smoked. She has never used smokeless tobacco. She reports that she does not drink alcohol  and does not use drugs.  Allergies  Allergen Reactions   Motrin [Ibuprofen] Rash   Seasonal Ic [Cholestatin] Other (See Comments)    Sneezing, watery eyes    Family History  Problem Relation Age of Onset   Obesity Sister    Breast cancer Sister        dx 91s   Breast cancer Sister    Kidney cancer Brother     Prior to Admission medications   Medication Sig Start Date End Date Taking? Authorizing Provider  acetaminophen  (TYLENOL ) 325 MG tablet Take 2 tablets (650 mg total) by mouth in the morning, at noon, and at bedtime. 08/15/24   Johnson, Clanford L, MD  anastrozole  (ARIMIDEX ) 1 MG tablet Take 1 tablet (1 mg total) by mouth daily. 10/09/24   Kandala, Hyndavi, MD  ASPIRIN  LOW DOSE 81 MG tablet Take 81 mg by mouth daily. 07/11/24   [provider]  atorvastatin  (LIPITOR) 10 MG tablet Take 10 mg by mouth daily. 08/10/23   [provider]  bortezomib  IV (VELCADE ) 3.5 MG injection Inject 3.5 mg into the vein every Thursday.    [provider]  famotidine  (PEPCID ) 20 MG tablet Take 20 mg by mouth at bedtime as needed for heartburn or indigestion. 03/16/24   [provider]  glucose blood (ACCU-CHEK AVIVA PLUS) test strip CHECK BLOOD SUGAR ONCE DAILY    [provider]  lenalidomide  (REVLIMID ) 10  MG capsule Take 1 capsule (10 mg total) by mouth daily. Take daily for 21 days, with 7 days off 10/25/24   Davonna Siad, MD  lidocaine  (LIDODERM ) 5 % Place 2 patches onto the skin daily. Remove & Discard patch within 12 hours or as directed by MD 08/15/24   Vicci Afton CROME, MD  magnesium  oxide (MAG-OX) 400 (240 Mg) MG tablet Take 1 tablet (400 mg total) by mouth 3 (three) times daily. 08/21/24   Kandala, Hyndavi, MD  memantine  (NAMENDA ) 5 MG tablet Take 5 mg by mouth daily.    [provider]  metFORMIN  (GLUCOPHAGE ) 500 MG tablet Take 500 mg by mouth daily. 09/25/24   [provider]  mirtazapine  (REMERON ) 7.5 MG tablet Take 7.5 mg by  mouth at bedtime. 08/07/24   [provider]  oxyCODONE  (OXY IR/ROXICODONE ) 5 MG immediate release tablet Take 1 tablet (5 mg total) by mouth every 6 (six) hours as needed for severe pain (pain score 7-10). 08/15/24   Johnson, Clanford L, MD  pantoprazole  (PROTONIX ) 40 MG tablet Take 1 tablet (40 mg total) by mouth daily. 05/30/24   Evonnie Lenis, MD  potassium chloride  (KLOR-CON ) 20 MEQ packet Take 20 mEq by mouth 2 (two) times daily. 08/04/24   [provider]  senna (SENOKOT) 8.6 MG TABS tablet Take 1 tablet (8.6 mg total) by mouth at bedtime. 08/15/24   Vicci Afton CROME, MD    Physical Exam: Vitals:   10/25/24 1830 10/25/24 1900 10/25/24 1915 10/25/24 1922  BP: (!) 170/72 (!) 149/67 (!) 165/68   Pulse:  61 64   Resp: 17 15 13    Temp:  98.4 F (36.9 C)    TempSrc:      SpO2:  99% 99% 99%  Weight:       *** Data Reviewed: {Tip this will not be part of the note when signed- Document your independent interpretation of telemetry tracing, EKG, lab, Radiology test or any other diagnostic tests. Add any new diagnostic test ordered today. (Optional):26781} {Results:26384}  Assessment and Plan: No notes have been filed under this hospital service. Service: Hospitalist     Advance Care Planning:   Code Status: Prior ***  Consults: ***  Family Communication: ***  Severity of Illness: {Observation/Inpatient:21159}  Author: Posey Maier, DO 10/25/2024 10:24 PM  For on call review [email or web address].

## 2024-10-25 NOTE — ED Provider Notes (Signed)
 [place] EMERGENCY DEPARTMENT AT [facility] Provider Note  CSN: [id number] Arrival date & time: 10/25/24 1600  Chief Complaint(s) Failure To Thrive  HPI Brittany Archer is a 83 y.o. female history of bilateral breast cancer, diabetes, multiple myeloma, presenting to the emergency department with weakness.  Patient reports she does not know why she is here.  She reports that she feels somewhat weak.  History is limited due to her dementia.  Discussed with patient's sister with whom patient lives, she reports over the past 2 to 3 days patient has been increasingly weak, not wanting to eat or drink, normally will get up some but lately not getting up at all.  Brought her into the emergency department to get evaluated.  No falls.  Patient denies any pain anywhere.  Per sister the patient was complaining of abdominal pain yesterday  Past Medical History Past Medical History:  Diagnosis Date   Breast cancer (HCC)    left breast/ 2008/ surg/ rad tx   Coronary artery disease    Diabetes mellitus    Patient Active Problem List   Diagnosis Date Noted   Intractable pain 08/11/2024   Cystitis 05/29/2024   Closed compression fracture of L2 lumbar vertebra, initial encounter (HCC) 05/27/2024   Type 2 diabetes mellitus with hyperglycemia (HCC) 05/27/2024   GERD (gastroesophageal reflux disease) 05/27/2024   Genetic testing 05/04/2024   Breast cancer of upper-inner quadrant of left female breast (HCC) 03/30/2024   Bilateral breast cancer (HCC) 03/30/2024   Macrocytic anemia 03/14/2022   Hypoglycemia 03/14/2022   CKD (chronic kidney disease), stage III B 03/14/2022   UTI (urinary tract infection) 03/14/2022   Dementia without behavioral disturbance (HCC) 03/14/2022   Open mouth wound 11/15/2020   Encounter for peripheral line placement 03/25/2020   PICC (peripherally inserted central catheter) in place 03/18/2020   Leukopenia 03/18/2020   Osteomyelitis, jaw acute     Osteonecrosis (HCC)    Confusion 02/20/2020   Facial cellulitis 02/19/2020   Type 2 diabetes mellitus (HCC)    Coronary artery disease    Hypertension    Stage 3b chronic kidney disease (HCC)    Hypomagnesemia    Hypophosphatemia    Hypokalemia 01/03/2020   CKD (chronic kidney disease) 05/01/2019   B12 deficiency 02/09/2018   Multiple myeloma not having achieved remission (HCC) 01/20/2018   Iron deficiency anemia 11/25/2017   Home Medication(s) Prior to Admission medications   Medication Sig Start Date End Date Taking? Authorizing Provider  acetaminophen  (TYLENOL ) 325 MG tablet Take 2 tablets (650 mg total) by mouth in the morning, at noon, and at bedtime. 08/15/24   Johnson, Clanford L, MD  anastrozole  (ARIMIDEX ) 1 MG tablet Take 1 tablet (1 mg total) by mouth daily. 10/09/24   Kandala, Hyndavi, MD  ASPIRIN  LOW DOSE 81 MG tablet Take 81 mg by mouth daily. 07/11/24   [provider]  atorvastatin  (LIPITOR) 10 MG tablet Take 10 mg by mouth daily. 08/10/23   [provider]  bortezomib  IV (VELCADE ) 3.5 MG injection Inject 3.5 mg into the vein every Thursday.    [provider]  famotidine  (PEPCID ) 20 MG tablet Take 20 mg by mouth at bedtime as needed for heartburn or indigestion. 03/16/24   [provider]  glucose blood (ACCU-CHEK AVIVA PLUS) test strip CHECK BLOOD SUGAR ONCE DAILY    [provider]  lenalidomide  (REVLIMID ) 10 MG capsule Take 1 capsule (10 mg total) by mouth daily. Take daily for 21 days, with  7 days off 10/25/24   Davonna Siad, MD  lidocaine  (LIDODERM ) 5 % Place 2 patches onto the skin daily. Remove & Discard patch within 12 hours or as directed by MD 08/15/24   Vicci Afton CROME, MD  magnesium  oxide (MAG-OX) 400 (240 Mg) MG tablet Take 1 tablet (400 mg total) by mouth 3 (three) times daily. 08/21/24   Davonna Siad, MD  memantine  (NAMENDA ) 5 MG tablet Take 5 mg by mouth daily.    [provider]  metFORMIN   (GLUCOPHAGE ) 500 MG tablet Take 500 mg by mouth daily. 09/25/24   [provider]  mirtazapine  (REMERON ) 7.5 MG tablet Take 7.5 mg by mouth at bedtime. 08/07/24   [provider]  oxyCODONE  (OXY IR/ROXICODONE ) 5 MG immediate release tablet Take 1 tablet (5 mg total) by mouth every 6 (six) hours as needed for severe pain (pain score 7-10). 08/15/24   Johnson, Clanford L, MD  pantoprazole  (PROTONIX ) 40 MG tablet Take 1 tablet (40 mg total) by mouth daily. 05/30/24   Evonnie Lenis, MD  potassium chloride  (KLOR-CON ) 20 MEQ packet Take 20 mEq by mouth 2 (two) times daily. 08/04/24   [provider]  senna (SENOKOT) 8.6 MG TABS tablet Take 1 tablet (8.6 mg total) by mouth at bedtime. 08/15/24   Vicci Afton CROME, MD                                                                                                                                    Past Surgical History Past Surgical History:  Procedure Laterality Date   ABDOMINAL HYSTERECTOMY     BREAST BIOPSY Left 03/24/2024   US  LT BREAST BX W LOC DEV 1ST LESION IMG BX SPEC US  GUIDE 03/24/2024 [facility]   BREAST BIOPSY Left 03/24/2024   US  LT BREAST BX W LOC DEV EA ADD LESION IMG BX SPEC US  GUIDE 03/24/2024 [facility]   BREAST BIOPSY Right 03/24/2024   MM RT BREAST BX W LOC DEV EA AD LESION IMG BX SPEC STEREO GUIDE 03/24/2024 [facility]   BREAST BIOPSY Right 03/24/2024   MM RT BREAST BX W LOC DEV 1ST LESION IMAGE BX SPEC STEREO GUIDE 03/24/2024 [facility]   BREAST SURGERY     DEBRIDEMENT MANDIBLE N/A 02/22/2020   Procedure: INCISION AND DRAINAGE WITH DEBRIDEMENT MANDIBLE;  Surgeon: Joanette Soulier, DMD;  Location: [facility];  Service: Oral Surgery;  Laterality: N/A;   DEBRIDEMENT MANDIBLE Right 03/19/2021   Procedure: DEBRIDEMENT OF BONE RIGHT INTERIOR  MANDIBLE;  Surgeon: Joanette Soulier, DMD;  Location: [facility];  Service: Oral Surgery;  Laterality: Right;   EYE SURGERY  2021   cataract removals    TOOTH EXTRACTION  N/A 02/22/2020   Procedure: DENTAL RESTORATION/EXTRACTIONS;  Surgeon: Joanette Soulier, DMD;  Location: [facility];  Service: Oral Surgery;  Laterality: N/A;  DENTAL KIT REQUESTED   Family History Family History  Problem Relation Age of Onset  Obesity Sister    Breast cancer Sister        dx 71s   Breast cancer Sister    Kidney cancer Brother     Social History Social History   Tobacco Use   Smoking status: Never   Smokeless tobacco: Never  Vaping Use   Vaping status: Never Used  Substance Use Topics   Alcohol use: No   Drug use: No   Allergies Motrin [ibuprofen] and Seasonal ic [cholestatin]  Review of Systems Review of Systems  All other systems reviewed and are negative.   Physical Exam Vital Signs  I have reviewed the triage vital signs BP (!) 165/68   Pulse 64   Temp 98.4 F (36.9 C)   Resp 13   Wt 58.1 kg   SpO2 99%   BMI 21.97 kg/m  Physical Exam Vitals and nursing note reviewed.  Constitutional:      General: She is not in acute distress.    Appearance: She is well-developed.  HENT:     Head: Normocephalic and atraumatic.     Mouth/Throat:     Mouth: Mucous membranes are dry.  Eyes:     Pupils: Pupils are equal, round, and reactive to light.  Cardiovascular:     Rate and Rhythm: Normal rate and regular rhythm.     Heart sounds: No murmur heard. Pulmonary:     Effort: Pulmonary effort is normal. No respiratory distress.     Breath sounds: Normal breath sounds.  Abdominal:     General: Abdomen is flat.     Palpations: Abdomen is soft.     Tenderness: There is no abdominal tenderness.  Musculoskeletal:        General: No tenderness.     Right lower leg: No edema.     Left lower leg: No edema.  Skin:    General: Skin is warm and dry.  Neurological:     General: No focal deficit present.     Mental Status: She is alert. Mental status is at baseline.     Comments: Oriented to self, place, not to year or situation  Psychiatric:        Mood and  Affect: Mood normal.        Behavior: Behavior normal.     ED Results and Treatments Labs (all labs ordered are listed, but only abnormal results are displayed) Labs Reviewed  COMPREHENSIVE METABOLIC PANEL WITH GFR - Abnormal; Notable for the following components:      Result Value   Creatinine, Ser 1.11 (*)    Calcium  8.7 (*)    Total Protein 5.2 (*)    Albumin 3.1 (*)    GFR, Estimated 49 (*)    All other components within normal limits  CBC WITH DIFFERENTIAL/PLATELET - Abnormal; Notable for the following components:   WBC 2.9 (*)    RBC 3.01 (*)    Hemoglobin 9.7 (*)    HCT 30.8 (*)    MCV 102.3 (*)    RDW 18.4 (*)    Platelets 89 (*)    All other components within normal limits  URINALYSIS, W/ REFLEX TO CULTURE (INFECTION SUSPECTED) - Abnormal; Notable for the following components:   Nitrite POSITIVE (*)    Leukocytes,Ua TRACE (*)    Bacteria, UA RARE (*)    All other components within normal limits  RESP PANEL BY RT-PCR (RSV, FLU A&B, COVID)  RVPGX2  URINE CULTURE  LIPASE, BLOOD  TSH  AMMONIA  Radiology CT ABDOMEN PELVIS W CONTRAST Result Date: 10/25/2024 EXAM: CT ABDOMEN AND PELVIS WITH CONTRAST 10/25/2024 07:48:21 PM TECHNIQUE: CT of the abdomen and pelvis was performed with the administration of 100 mL of iohexol  (OMNIPAQUE ) 300 MG/ML solution. Multiplanar reformatted images are provided for review. Automated exposure control, iterative reconstruction, and/or weight-based adjustment of the mA/kV was utilized to reduce the radiation dose to as low as reasonably achievable. COMPARISON: CT abdomen and pelvis 8:15. CLINICAL HISTORY: Abdominal pain, acute, nonlocalized. Pt BIB [facility] for failure to thrive. Pt generally weak, not eating or drinking. VS WNL. FINDINGS: LOWER CHEST: No acute abnormality. LIVER: The liver is unremarkable. GALLBLADDER AND BILE  DUCTS: Gallbladder is unremarkable. No biliary ductal dilatation. SPLEEN: No acute abnormality. PANCREAS: No acute abnormality. ADRENAL GLANDS: No acute abnormality. KIDNEYS, URETERS AND BLADDER: There is a cyst in the left kidney measuring 4.1 cm. Per consensus, no follow-up is needed for simple Bosniak type 1 and 2 renal cysts, unless the patient has a malignancy history or risk factors. No stones in the kidneys or ureters. No hydronephrosis. No perinephric or periureteral stranding. There is a small focus of air in the bladder. GI AND BOWEL: Stomach demonstrates no acute abnormality. There is no bowel obstruction. The appendix is not seen. There is sigmoid and descending colon diverticulosis. There is some mild wall thickening of the transverse colon without surrounding inflammation. There are some scattered air-fluid levels within the proximal small bowel loops. There is some wall thickening of the anorectal junction. PERITONEUM AND RETROPERITONEUM: No ascites. No free air. VASCULATURE: Aorta is normal in caliber. There are atherosclerotic calcifications of the aorta. LYMPH NODES: No lymphadenopathy. REPRODUCTIVE ORGANS: No acute abnormality. BONES AND SOFT TISSUES: The bones are diffusely osteopenic. Moderate compression deformity of L5 is unchanged. Prominent Schmorl's nodes in L2 and L3 appear unchanged. There is mild compression deformity of the superior endplate of L1 which has slightly progressed. No focal soft tissue abnormality. IMPRESSION: 1. Mild wall thickening of the transverse colon without surrounding inflammation. Correlate clinically for nonspecific colitis. 2. Wall thickening of the anorectal junction. Recommend correlation with physical examination. 3. Sigmoid and descending colon diverticulosis without evidence of diverticulitis. Electronically signed by: Greig Pique MD 10/25/2024 08:12 PM EDT RP Workstation: [id number]   CT Head Wo Contrast Result Date: 10/25/2024 CLINICAL DATA:  Mental  status change, unknown cause EXAM: CT HEAD WITHOUT CONTRAST TECHNIQUE: Contiguous axial images were obtained from the base of the skull through the vertex without intravenous contrast. RADIATION DOSE REDUCTION: This exam was performed according to the departmental dose-optimization program which includes automated exposure control, adjustment of the mA and/or kV according to patient size and/or use of iterative reconstruction technique. COMPARISON:  Head CT 06/16/2023 FINDINGS: Brain: No intracranial hemorrhage, mass effect, or midline shift. Generalized atrophy. No hydrocephalus. The basilar cisterns are patent. Mild periventricular deep white matter hypodensity consistent with chronic small vessel ischemia. No evidence of territorial infarct or acute ischemia. No extra-axial or intracranial fluid collection. Vascular: Atherosclerosis of skullbase vasculature without hyperdense vessel or abnormal calcification. Skull: No fracture or focal lesion. Sinuses/Orbits: No acute finding. Chronic defect of the right lamina preparation. Other: None. IMPRESSION: 1. No acute intracranial abnormality. 2. Generalized atrophy and chronic small vessel ischemia. Electronically Signed   By: Andrea Gasman M.D.   On: 10/25/2024 19:54   DG Chest Portable 1 View Result Date: 10/25/2024 CLINICAL DATA:  Shortness of breath and failure to thrive. Multiple myeloma. EXAM: PORTABLE CHEST 1 VIEW COMPARISON:  03/14/2022 FINDINGS: Patient  is rotated to the right. Metallic device overlies the lateral right lower lung. Several metallic objects project over the right thorax including a Bobby pin. Lungs are hypoinflated without acute airspace consolidation or effusion. Cardiomediastinal silhouette and remainder of the exam is unchanged. IMPRESSION: Hypoinflation without acute cardiopulmonary disease. Electronically Signed   By: Toribio Agreste M.D.   On: 10/25/2024 18:42    Pertinent labs & imaging results that were available during my care  of the patient were reviewed by me and considered in my medical decision making (see MDM for details).  Medications Ordered in ED Medications  sodium chloride  0.9 % bolus 1,000 mL (0 mLs Intravenous Stopped 10/25/24 2052)  iohexol  (OMNIPAQUE ) 300 MG/ML solution 100 mL (100 mLs Intravenous Contrast Given 10/25/24 1923)  cefTRIAXone  (ROCEPHIN ) 2 g in sodium chloride  0.9 % 100 mL IVPB (2 g Intravenous New Bag/Given 10/25/24 2213)                                                                                                                                     Procedures Procedures  (including critical care time)  Medical Decision Making / ED Course   MDM:  83 year old presenting to the emergency department generalized weakness.  Differential broad, includes intracranial process, will check CT head.  Differential also includes occult infectious process, will obtain chest x-ray.  Was complaining of abdominal pain yesterday as well as obtain CT abdomen.  Also check urinalysis.  Per sister patient's mentation seems to be about baseline, patient denies headache, low concern for any CNS infectious process.  Differential also includes toxic or metabolic process will check laboratory testing.  Will reassess.  Give some IV fluids  Clinical Course as of 10/25/24 2256  Wed Oct 25, 2024  2254 Workup overall reassuring, does have evidence of possible UTI which may be contribuing factor. CT abdomen shows possible colitis but no reported diarrhea, abd soft and non-tender, lower concern for this. Discussed with hospitalist Dr. Manfred who will admit patient for further care  [WS]    Clinical Course User Index [WS] Francesca Elsie CROME, MD     Additional history obtained: -Additional history obtained from caregiver -External records from outside source obtained and reviewed including: Chart review including previous notes, labs, imaging, consultation notes including prior notes    Lab Tests: -I  ordered, reviewed, and interpreted labs.   The pertinent results include:   Labs Reviewed  COMPREHENSIVE METABOLIC PANEL WITH GFR - Abnormal; Notable for the following components:      Result Value   Creatinine, Ser 1.11 (*)    Calcium  8.7 (*)    Total Protein 5.2 (*)    Albumin 3.1 (*)    GFR, Estimated 49 (*)    All other components within normal limits  CBC WITH DIFFERENTIAL/PLATELET - Abnormal; Notable for the following components:   WBC 2.9 (*)    RBC 3.01 (*)    Hemoglobin  9.7 (*)    HCT 30.8 (*)    MCV 102.3 (*)    RDW 18.4 (*)    Platelets 89 (*)    All other components within normal limits  URINALYSIS, W/ REFLEX TO CULTURE (INFECTION SUSPECTED) - Abnormal; Notable for the following components:   Nitrite POSITIVE (*)    Leukocytes,Ua TRACE (*)    Bacteria, UA RARE (*)    All other components within normal limits  RESP PANEL BY RT-PCR (RSV, FLU A&B, COVID)  RVPGX2  URINE CULTURE  LIPASE, BLOOD  TSH  AMMONIA    Notable for chronic pancytopenia, UTI   Imaging Studies ordered: I ordered imaging studies including CT head, CT abd On my interpretation imaging demonstrates no acute process I independently visualized and interpreted imaging. I agree with the radiologist interpretation   Medicines ordered and prescription drug management: Meds ordered this encounter  Medications   sodium chloride  0.9 % bolus 1,000 mL   iohexol  (OMNIPAQUE ) 300 MG/ML solution 100 mL   cefTRIAXone  (ROCEPHIN ) 2 g in sodium chloride  0.9 % 100 mL IVPB    Antibiotic Indication::   UTI    -I have reviewed the patients home medicines and have made adjustments as needed  Reevaluation: After the interventions noted above, I reevaluated the patient and found that their symptoms have stayed the same  Co morbidities that complicate the patient evaluation  Past Medical History:  Diagnosis Date   Breast cancer (HCC)    left breast/ 2008/ surg/ rad tx   Coronary artery disease    Diabetes  mellitus       Dispostion: Disposition decision including need for hospitalization was considered, and patient admitted to the hospital.    Final Clinical Impression(s) / ED Diagnoses Final diagnoses:  Acute cystitis without hematuria  Generalized weakness     This chart was dictated using voice recognition software.  Despite best efforts to proofread,  errors can occur which can change the documentation meaning.    Francesca Elsie CROME, MD 10/25/24 2256

## 2024-10-26 DIAGNOSIS — N3000 Acute cystitis without hematuria: Secondary | ICD-10-CM | POA: Diagnosis not present

## 2024-10-26 LAB — CBC
HCT: 28.6 % — ABNORMAL LOW (ref 36.0–46.0)
Hemoglobin: 8.8 g/dL — ABNORMAL LOW (ref 12.0–15.0)
MCH: 31.5 pg (ref 26.0–34.0)
MCHC: 30.8 g/dL (ref 30.0–36.0)
MCV: 102.5 fL — ABNORMAL HIGH (ref 80.0–100.0)
Platelets: 85 K/uL — ABNORMAL LOW (ref 150–400)
RBC: 2.79 MIL/uL — ABNORMAL LOW (ref 3.87–5.11)
RDW: 18.2 % — ABNORMAL HIGH (ref 11.5–15.5)
WBC: 2.7 K/uL — ABNORMAL LOW (ref 4.0–10.5)
nRBC: 0 % (ref 0.0–0.2)

## 2024-10-26 LAB — CBG MONITORING, ED
Glucose-Capillary: 55 mg/dL — ABNORMAL LOW (ref 70–99)
Glucose-Capillary: 67 mg/dL — ABNORMAL LOW (ref 70–99)
Glucose-Capillary: 70 mg/dL (ref 70–99)
Glucose-Capillary: 120 mg/dL — ABNORMAL HIGH (ref 70–99)

## 2024-10-26 LAB — COMPREHENSIVE METABOLIC PANEL WITH GFR
ALT: 10 U/L (ref 0–44)
AST: 15 U/L (ref 15–41)
Albumin: 2.9 g/dL — ABNORMAL LOW (ref 3.5–5.0)
Alkaline Phosphatase: 108 U/L (ref 38–126)
Anion gap: 12 (ref 5–15)
BUN: 15 mg/dL (ref 8–23)
CO2: 22 mmol/L (ref 22–32)
Calcium: 8.2 mg/dL — ABNORMAL LOW (ref 8.9–10.3)
Chloride: 106 mmol/L (ref 98–111)
Creatinine, Ser: 0.91 mg/dL (ref 0.44–1.00)
GFR, Estimated: >60 mL/min (ref >60)
Glucose, Bld: 57 mg/dL — ABNORMAL LOW (ref 70–99)
Potassium: 3.7 mmol/L (ref 3.5–5.1)
Sodium: 139 mmol/L (ref 135–145)
Total Bilirubin: 0.6 mg/dL (ref 0.0–1.2)
Total Protein: 4.9 g/dL — ABNORMAL LOW (ref 6.5–8.1)

## 2024-10-26 LAB — GLUCOSE, CAPILLARY
Glucose-Capillary: 137 mg/dL — ABNORMAL HIGH (ref 70–99)
Glucose-Capillary: 104 mg/dL — ABNORMAL HIGH (ref 70–99)

## 2024-10-26 LAB — MAGNESIUM: Magnesium: 1.9 mg/dL (ref 1.7–2.4)

## 2024-10-26 LAB — VITAMIN B12: Vitamin B-12: 536 pg/mL (ref 180–914)

## 2024-10-26 LAB — FOLATE: Folate: 6.6 ng/mL (ref >5.9)

## 2024-10-26 LAB — PHOSPHORUS: Phosphorus: 2.8 mg/dL (ref 2.5–4.6)

## 2024-10-26 MED ORDER — INSULIN ASPART 100 UNIT/ML IJ SOLN
0.0000 [IU] | Freq: Three times a day (TID) | INTRAMUSCULAR | Status: DC
  Administered 2024-10-26: 1 [IU] via SUBCUTANEOUS
  Administered 2024-10-27 – 2024-10-28 (×2): 2 [IU] via SUBCUTANEOUS
  Administered 2024-10-30: 1 [IU] via SUBCUTANEOUS

## 2024-10-26 MED ORDER — PANTOPRAZOLE SODIUM 40 MG PO TBEC
40.0000 mg | DELAYED_RELEASE_TABLET | Freq: Every day | ORAL | Status: DC
  Administered 2024-10-26 – 2024-10-30 (×5): 40 mg via ORAL
  Filled 2024-10-26 (×5): qty 1

## 2024-10-26 MED ORDER — ASPIRIN 81 MG PO TBEC
81.0000 mg | DELAYED_RELEASE_TABLET | Freq: Every day | ORAL | Status: DC
  Administered 2024-10-26 – 2024-10-30 (×5): 81 mg via ORAL
  Filled 2024-10-26 (×5): qty 1

## 2024-10-26 MED ORDER — ONDANSETRON HCL 4 MG PO TABS: 4.0000 mg | ORAL_TABLET | Freq: Four times a day (QID) | ORAL | Status: DC | PRN

## 2024-10-26 MED ORDER — ENSURE PLUS HIGH PROTEIN PO LIQD
237.0000 mL | Freq: Two times a day (BID) | ORAL | Status: DC
  Administered 2024-10-26 – 2024-10-27 (×2): 237 mL via ORAL
  Filled 2024-10-26 (×4): qty 237

## 2024-10-26 MED ORDER — FAMOTIDINE 20 MG PO TABS
20.0000 mg | ORAL_TABLET | Freq: Every evening | ORAL | Status: DC | PRN
  Administered 2024-10-27: 20 mg via ORAL
  Filled 2024-10-26: qty 1

## 2024-10-26 MED ORDER — OXYCODONE HCL 5 MG PO TABS: 5.0000 mg | ORAL_TABLET | Freq: Four times a day (QID) | ORAL | Status: DC | PRN

## 2024-10-26 MED ORDER — ATORVASTATIN CALCIUM 10 MG PO TABS
10.0000 mg | ORAL_TABLET | Freq: Every day | ORAL | Status: DC
  Administered 2024-10-26 – 2024-10-30 (×5): 10 mg via ORAL
  Filled 2024-10-26 (×5): qty 1

## 2024-10-26 MED ORDER — ACETAMINOPHEN 650 MG RE SUPP: 650.0000 mg | Freq: Four times a day (QID) | RECTAL | Status: DC | PRN

## 2024-10-26 MED ORDER — ZINC OXIDE 40 % EX OINT: TOPICAL_OINTMENT | CUTANEOUS | Status: DC | PRN

## 2024-10-26 MED ORDER — SODIUM CHLORIDE 0.9 % IV SOLN
1.0000 g | INTRAVENOUS | Status: DC
  Administered 2024-10-26 – 2024-10-28 (×3): 1 g via INTRAVENOUS
  Filled 2024-10-26 (×4): qty 10

## 2024-10-26 MED ORDER — ACETAMINOPHEN 325 MG PO TABS
650.0000 mg | ORAL_TABLET | Freq: Four times a day (QID) | ORAL | Status: DC | PRN
  Administered 2024-10-29: 650 mg via ORAL
  Filled 2024-10-26: qty 2

## 2024-10-26 MED ORDER — ANASTROZOLE 1 MG PO TABS
1.0000 mg | ORAL_TABLET | Freq: Every day | ORAL | Status: DC
  Administered 2024-10-26 – 2024-10-30 (×5): 1 mg via ORAL
  Filled 2024-10-26 (×6): qty 1

## 2024-10-26 MED ORDER — MEMANTINE HCL 10 MG PO TABS
5.0000 mg | ORAL_TABLET | Freq: Every day | ORAL | Status: DC
  Administered 2024-10-26 – 2024-10-30 (×5): 5 mg via ORAL
  Filled 2024-10-26 (×5): qty 1

## 2024-10-26 MED ORDER — ONDANSETRON HCL 4 MG/2ML IJ SOLN: 4.0000 mg | Freq: Four times a day (QID) | INTRAMUSCULAR | Status: DC | PRN

## 2024-10-26 NOTE — ED Notes (Signed)
 Pt alert, NAD, calm, interactive, resps e/u, speaking in clear complete sentences. Denies pain, sob, nausea, dizziness, heartburn. Pt finished OJ. Agreeable to something more to eat/drink.

## 2024-10-26 NOTE — ED Notes (Signed)
 Pt's blood sugar was 55. I gave her some orange juice

## 2024-10-26 NOTE — Care Management CC44 (Signed)
 Condition Code 44 Documentation Completed  Patient Details  Name: Brittany Archer MRN: [id number] Date of Birth: August 28, 1941   Condition Code 44 given:  Yes Patient signature on Condition Code 44 notice:  Yes Documentation of 2 MD's agreement:  Yes Code 44 added to claim:  Yes    Mcarthur Saddie Kim, LCSW 10/26/2024, 12:05 PM

## 2024-10-26 NOTE — ED Notes (Signed)
 Called to request ensure for pt

## 2024-10-26 NOTE — TOC Initial Note (Addendum)
 Transition of Care [facility]) - Initial/Assessment Note    Patient Details  Name: Brittany Archer MRN: [id number] Date of Birth: [birth year]  [facility] CM/SW Contact:    Mcarthur Saddie Kim, LCSW Phone Number: 10/26/2024, 12:16 PM  Clinical Narrative:  Pt admitted due to presumed UTI. Assessment completed with pt's sister, Vernard due to pt's documented severe dementia. Pt lives with sister, Tonda who assist with ADLs as needed. Pt has rolling walker. PT evaluated pt and recommend HHPT. Discussed with Mae who is agreeable with no preference on agency. TOC will refer.                  Expected Discharge Plan: Home w Home Health Services Barriers to Discharge: Continued Medical Work up   Patient Goals and CMS Choice Patient states their goals for this hospitalization and ongoing recovery are:: return home   Choice offered to / list presented to : Sibling Lexa ownership interest in [facility].provided to::  (n/a)    Expected Discharge Plan and Services In-house Referral: Clinical Social Work   Post Acute Care Choice: Home Health Living arrangements for the past 2 months: Single Family Home                                      Prior Living Arrangements/Services Living arrangements for the past 2 months: Single Family Home Lives with:: Siblings Patient language and need for interpreter reviewed:: Yes Do you feel safe going back to the place where you live?: Yes      Need for Family Participation in Patient Care: Yes (Comment) Care giver support system in place?: Yes (comment) Current home services: DME (walker) Criminal Activity/Legal Involvement Pertinent to Current Situation/Hospitalization: No - Comment as needed  Activities of Daily Living      Permission Sought/Granted                  Emotional Assessment   Attitude/Demeanor/Rapport: Unable to Assess Affect (typically observed): Unable to Assess   Alcohol / Substance  Use: Not Applicable Psych Involvement: No (comment)  Admission diagnosis:  UTI (urinary tract infection) [N39.0] Patient Active Problem List   Diagnosis Date Noted   Intractable pain 08/11/2024   Cystitis 05/29/2024   Closed compression fracture of L2 lumbar vertebra, initial encounter (HCC) 05/27/2024   Type 2 diabetes mellitus with hyperglycemia (HCC) 05/27/2024   GERD (gastroesophageal reflux disease) 05/27/2024   Genetic testing 05/04/2024   Breast cancer of upper-inner quadrant of left female breast (HCC) 03/30/2024   Bilateral breast cancer (HCC) 03/30/2024   Macrocytic anemia 03/14/2022   Hypoglycemia 03/14/2022   CKD (chronic kidney disease), stage III B 03/14/2022   UTI (urinary tract infection) 03/14/2022   Dementia without behavioral disturbance (HCC) 03/14/2022   Open mouth wound 11/15/2020   Encounter for peripheral line placement 03/25/2020   PICC (peripherally inserted central catheter) in place 03/18/2020   Leukopenia 03/18/2020   Osteomyelitis, jaw acute    Osteonecrosis (HCC)    Confusion 02/20/2020   Facial cellulitis 02/19/2020   Type 2 diabetes mellitus (HCC)    Coronary artery disease    Hypertension    Stage 3b chronic kidney disease (HCC)    Hypomagnesemia    Hypophosphatemia    Hypokalemia 01/03/2020   CKD (chronic kidney disease) 05/01/2019   B12 deficiency 02/09/2018   Multiple myeloma not having achieved remission (HCC) 01/20/2018  Iron deficiency anemia 11/25/2017   PCP:  Katrinka Aquas, MD Pharmacy:   [facility] - [place], [place] - [address] [place] [address] Phone: [phone] Fax: [phone]  [facility] [phone] - [place], [place] - [address] AT [facility] OF [address] [place] [address] Phone: [phone] Fax: [phone]  [facility] - [place], [place] - [address] [place] [address] Phone:  [phone] Fax: [phone]  [facility] - [place], [place] - [address] [place] [address] Phone: [phone] Fax: [phone]  [facility] - [place], [place] - [place] [address] [place] [address] Phone: [phone] Fax: [phone]     Social Drivers of Health (SDOH) Social History: SDOH Screenings   Food Insecurity: No Food Insecurity (08/12/2024)  Housing: Low Risk  (08/12/2024)  Transportation Needs: No Transportation Needs (08/12/2024)  Utilities: Not At Risk (08/12/2024)  Alcohol Screen: Low Risk  (12/09/2020)  Depression (PHQ2-9): Low Risk  (10/19/2024)  Financial Resource Strain: Low Risk  (12/09/2020)  Physical Activity: Inactive (12/09/2020)  Social Connections: Moderately Isolated (08/12/2024)  Stress: No Stress Concern Present (12/09/2020)  Tobacco Use: Low Risk  (10/25/2024)   SDOH Interventions:     Readmission Risk Interventions    10/26/2024   12:13 PM  Readmission Risk Prevention Plan  Transportation Screening Complete  HRI or Home Care Consult Complete  Social Work Consult for Recovery Care Planning/Counseling Complete  Palliative Care Screening Not Applicable  Medication Review Oceanographer) Complete

## 2024-10-26 NOTE — ED Notes (Signed)
Orange juice given for CBG.

## 2024-10-26 NOTE — Plan of Care (Signed)
  Problem: Acute Rehab PT Goals(only PT should resolve) Goal: Pt Will Go Sit To Supine/Side Outcome: Progressing Flowsheets (Taken 10/26/2024 1349) Pt will go Sit to Supine/Side: with modified independence Goal: Patient Will Perform Sitting Balance Outcome: Progressing Flowsheets (Taken 10/26/2024 1349) Patient will perform sitting balance: with modified independence Goal: Patient Will Transfer Sit To/From Stand Outcome: Progressing Flowsheets (Taken 10/26/2024 1349) Patient will transfer sit to/from stand:  with supervision  with modified independence Goal: Pt Will Transfer Bed To Chair/Chair To Bed Outcome: Progressing Flowsheets (Taken 10/26/2024 1349) Pt will Transfer Bed to Chair/Chair to Bed:  with supervision  with modified independence Note: W/ RW Goal: Pt Will Perform Standing Balance Or Pre-Gait Outcome: Progressing Flowsheets (Taken 10/26/2024 1349) Pt will perform standing balance or pre-gait:  with Supervision  with Modified Independent Note: W/ RW Goal: Pt Will Ambulate Outcome: Progressing Flowsheets (Taken 10/26/2024 1349) Pt will Ambulate:  75 feet  100 feet  with supervision  with modified independence Note: W/ RW    Marilynne Dupuis, SPT

## 2024-10-26 NOTE — Care Management Obs Status (Signed)
 MEDICARE OBSERVATION STATUS NOTIFICATION   Patient Details  Name: Brittany Archer MRN: [id number] Date of Birth: April 03, 1941   Medicare Observation Status Notification Given:  Yes    Mcarthur Saddie Kim, LCSW 10/26/2024, 12:05 PM

## 2024-10-26 NOTE — Progress Notes (Signed)
 PROGRESS NOTE    Brittany Archer [facility]  FMW:[id number] DOB: May 02, 1941 DOA: 10/25/2024 PCP: Katrinka Aquas, MD   Brief Narrative:    Brittany Archer is a 83 y.o. female with medical history significant of CAD, type 2 diabetes mellitus, multiple myeloma, bilateral breast cancer, CKD stage IIIb, GERD, L2 compression fracture who presents to the emergency department from home via EMS for evaluation of weakness.  Patient was admitted for generalized weakness in the setting of UTI and was started on IV Rocephin  empirically.  She is also thought to have some failure to thrive.  Assessment & Plan:   Principal Problem:   UTI (urinary tract infection)  Assessment and Plan:   Presumed UTI POA Patient was started on IV ceftriaxone , we shall continue with same at this time Urine culture pending   Generalized weakness Failure to thrive in adults Protein supplement will be provided Dietitian will be consulted, we shall await further recommendation Fall precautions   Pancytopenia This may be due to chemotherapy Continue to monitor CBC   Macrocytic anemia MCV 102.3, vitamin B12 and folate levels will be checked   Chronic back pain Continue TLSO brace Continue PT and OT eval and treat   Type 2 diabetes mellitus Hemoglobin A1c on 05/29/23 was 7.4 Continue ISS and hypoglycemia protocol Metformin  will be held at this time   CAD Mixed hyperlipidemia Continue aspirin  and atorvastatin    Advanced dementia Continue home memantine    GERD Continue pantoprazole , Pepcid    Left breast cancer Multiple Myeloma Continue home Arimidex  Continue Velcade  with [facility]     DVT prophylaxis:SCDs Code Status: Full Family Communication: None at bedside Disposition Plan:  Status is: Inpatient Remains inpatient appropriate because: Need for IV medications.   Consultants:  None  Procedures:  None  Antimicrobials:  Anti-infectives (From admission, onward)    Start     Dose/Rate  Route Frequency Ordered Stop   10/26/24 1000  cefTRIAXone  (ROCEPHIN ) 1 g in sodium chloride  0.9 % 100 mL IVPB        1 g 200 mL/hr over 30 Minutes Intravenous Every 24 hours 10/26/24 0229     10/25/24 2200  cefTRIAXone  (ROCEPHIN ) 2 g in sodium chloride  0.9 % 100 mL IVPB        2 g 200 mL/hr over 30 Minutes Intravenous  Once 10/25/24 2150 10/25/24 2243       Subjective: Patient seen and evaluated today with no new acute complaints or concerns. No acute concerns or events noted overnight.  Objective: Vitals:   10/26/24 0730 10/26/24 1000 10/26/24 1005 10/26/24 1006  BP:  139/73  139/73  Pulse: (!) 54  65 65  Resp: 12 16 15 16   Temp:      TempSrc:      SpO2: 98%  96% 98%  Weight:       No intake or output data in the 24 hours ending 10/26/24 1049 Filed Weights   10/25/24 1605  Weight: 58.1 kg    Examination:  General exam: Appears calm and comfortable  Respiratory system: Clear to auscultation. Respiratory effort normal. Cardiovascular system: S1 & S2 heard, RRR.  Gastrointestinal system: Abdomen is soft Central nervous system: Alert and awake Extremities: No edema Skin: No significant lesions noted Psychiatry: Flat affect.    Data Reviewed: I have personally reviewed following labs and imaging studies  CBC: Recent Labs  Lab 10/25/24 1658 10/26/24 0327  WBC 2.9* 2.7*  NEUTROABS 1.8  --   HGB 9.7* 8.8*  HCT 30.8*  28.6*  MCV 102.3* 102.5*  PLT 89* 85*   Basic Metabolic Panel: Recent Labs  Lab 10/25/24 1658 10/26/24 0327  NA 139 139  K 4.3 3.7  CL 106 106  CO2 22 22  GLUCOSE 71 57*  BUN 18 15  CREATININE 1.11* 0.91  CALCIUM  8.7* 8.2*  MG  --  1.9  PHOS  --  2.8   GFR: Estimated Creatinine Clearance: 39.7 mL/min (by C-G formula based on SCr of 0.91 mg/dL). Liver Function Tests: Recent Labs  Lab 10/25/24 1658 10/26/24 0327  AST 16 15  ALT 9 10  ALKPHOS 115 108  BILITOT 1.0 0.6  PROT 5.2* 4.9*  ALBUMIN 3.1* 2.9*   Recent Labs  Lab  10/25/24 1658  LIPASE 16   Recent Labs  Lab 10/25/24 1838  AMMONIA <13   Coagulation Profile: No results for input(s): INR, PROTIME in the last 168 hours. Cardiac Enzymes: No results for input(s): CKTOTAL, CKMB, CKMBINDEX, TROPONINI in the last 168 hours. BNP (last 3 results) No results for input(s): PROBNP in the last 8760 hours. HbA1C: No results for input(s): HGBA1C in the last 72 hours. CBG: Recent Labs  Lab 10/26/24 0739 10/26/24 0826  GLUCAP 55* 67*   Lipid Profile: No results for input(s): CHOL, HDL, LDLCALC, TRIG, CHOLHDL, LDLDIRECT in the last 72 hours. Thyroid Function Tests: Recent Labs    10/25/24 1658  TSH 0.529   Anemia Panel: Recent Labs    10/26/24 0327  VITAMINB12 536  FOLATE 6.6   Sepsis Labs: No results for input(s): PROCALCITON, LATICACIDVEN in the last 168 hours.  Recent Results (from the past 240 hours)  Resp panel by RT-PCR (RSV, Flu A&B, Covid) Anterior Nasal Swab     Status: None   Collection Time: 10/25/24  6:37 PM   Specimen: Anterior Nasal Swab  Result Value Ref Range Status   SARS Coronavirus 2 by RT PCR NEGATIVE NEGATIVE Final    Comment: (NOTE) SARS-CoV-2 target nucleic acids are NOT DETECTED.  The SARS-CoV-2 RNA is generally detectable in upper respiratory specimens during the acute phase of infection. The lowest concentration of SARS-CoV-2 viral copies this assay can detect is 138 copies/mL. A negative result does not preclude SARS-Cov-2 infection and should not be used as the sole basis for treatment or other patient management decisions. A negative result may occur with  improper specimen collection/handling, submission of specimen other than nasopharyngeal swab, presence of viral mutation(s) within the areas targeted by this assay, and inadequate number of viral copies(<138 copies/mL). A negative result must be combined with clinical observations, patient history, and  epidemiological information. The expected result is Negative.  Fact Sheet for Patients:  bloggercourse.com  Fact Sheet for Healthcare Providers:  seriousbroker.it  This test is no t yet approved or cleared by the [place]  FDA and  has been authorized for detection and/or diagnosis of SARS-CoV-2 by FDA under an Emergency Use Authorization (EUA). This EUA will remain  in effect (meaning this test can be used) for the duration of the COVID-19 declaration under Section 564(b)(1) of the Act, 21 U.S.C.section 360bbb-3(b)(1), unless the authorization is terminated  or revoked sooner.       Influenza A by PCR NEGATIVE NEGATIVE Final   Influenza B by PCR NEGATIVE NEGATIVE Final    Comment: (NOTE) The Xpert Xpress SARS-CoV-2/FLU/RSV plus assay is intended as an aid in the diagnosis of influenza from Nasopharyngeal swab specimens and should not be used as a sole basis for treatment. Nasal washings and  aspirates are unacceptable for Xpert Xpress SARS-CoV-2/FLU/RSV testing.  Fact Sheet for Patients: bloggercourse.com  Fact Sheet for Healthcare Providers: seriousbroker.it  This test is not yet approved or cleared by the [place]  FDA and has been authorized for detection and/or diagnosis of SARS-CoV-2 by FDA under an Emergency Use Authorization (EUA). This EUA will remain in effect (meaning this test can be used) for the duration of the COVID-19 declaration under Section 564(b)(1) of the Act, 21 U.S.C. section 360bbb-3(b)(1), unless the authorization is terminated or revoked.     Resp Syncytial Virus by PCR NEGATIVE NEGATIVE Final    Comment: (NOTE) Fact Sheet for Patients: bloggercourse.com  Fact Sheet for Healthcare Providers: seriousbroker.it  This test is not yet approved or cleared by the [place]  FDA and has been  authorized for detection and/or diagnosis of SARS-CoV-2 by FDA under an Emergency Use Authorization (EUA). This EUA will remain in effect (meaning this test can be used) for the duration of the COVID-19 declaration under Section 564(b)(1) of the Act, 21 U.S.C. section 360bbb-3(b)(1), unless the authorization is terminated or revoked.  Performed at [facility], [address], [place], [place] [address]          Radiology Studies: CT ABDOMEN PELVIS W CONTRAST Result Date: 10/25/2024 EXAM: CT ABDOMEN AND PELVIS WITH CONTRAST 10/25/2024 07:48:21 PM TECHNIQUE: CT of the abdomen and pelvis was performed with the administration of 100 mL of iohexol  (OMNIPAQUE ) 300 MG/ML solution. Multiplanar reformatted images are provided for review. Automated exposure control, iterative reconstruction, and/or weight-based adjustment of the mA/kV was utilized to reduce the radiation dose to as low as reasonably achievable. COMPARISON: CT abdomen and pelvis 8:15. CLINICAL HISTORY: Abdominal pain, acute, nonlocalized. Pt BIB [facility] for failure to thrive. Pt generally weak, not eating or drinking. VS WNL. FINDINGS: LOWER CHEST: No acute abnormality. LIVER: The liver is unremarkable. GALLBLADDER AND BILE DUCTS: Gallbladder is unremarkable. No biliary ductal dilatation. SPLEEN: No acute abnormality. PANCREAS: No acute abnormality. ADRENAL GLANDS: No acute abnormality. KIDNEYS, URETERS AND BLADDER: There is a cyst in the left kidney measuring 4.1 cm. Per consensus, no follow-up is needed for simple Bosniak type 1 and 2 renal cysts, unless the patient has a malignancy history or risk factors. No stones in the kidneys or ureters. No hydronephrosis. No perinephric or periureteral stranding. There is a small focus of air in the bladder. GI AND BOWEL: Stomach demonstrates no acute abnormality. There is no bowel obstruction. The appendix is not seen. There is sigmoid and descending colon diverticulosis. There is some mild wall  thickening of the transverse colon without surrounding inflammation. There are some scattered air-fluid levels within the proximal small bowel loops. There is some wall thickening of the anorectal junction. PERITONEUM AND RETROPERITONEUM: No ascites. No free air. VASCULATURE: Aorta is normal in caliber. There are atherosclerotic calcifications of the aorta. LYMPH NODES: No lymphadenopathy. REPRODUCTIVE ORGANS: No acute abnormality. BONES AND SOFT TISSUES: The bones are diffusely osteopenic. Moderate compression deformity of L5 is unchanged. Prominent Schmorl's nodes in L2 and L3 appear unchanged. There is mild compression deformity of the superior endplate of L1 which has slightly progressed. No focal soft tissue abnormality. IMPRESSION: 1. Mild wall thickening of the transverse colon without surrounding inflammation. Correlate clinically for nonspecific colitis. 2. Wall thickening of the anorectal junction. Recommend correlation with physical examination. 3. Sigmoid and descending colon diverticulosis without evidence of diverticulitis. Electronically signed by: Greig Pique MD 10/25/2024 08:12 PM EDT RP Workstation: [id number]   CT Head Wo Contrast  Result Date: 10/25/2024 CLINICAL DATA:  Mental status change, unknown cause EXAM: CT HEAD WITHOUT CONTRAST TECHNIQUE: Contiguous axial images were obtained from the base of the skull through the vertex without intravenous contrast. RADIATION DOSE REDUCTION: This exam was performed according to the departmental dose-optimization program which includes automated exposure control, adjustment of the mA and/or kV according to patient size and/or use of iterative reconstruction technique. COMPARISON:  Head CT 06/16/2023 FINDINGS: Brain: No intracranial hemorrhage, mass effect, or midline shift. Generalized atrophy. No hydrocephalus. The basilar cisterns are patent. Mild periventricular deep white matter hypodensity consistent with chronic small vessel ischemia. No  evidence of territorial infarct or acute ischemia. No extra-axial or intracranial fluid collection. Vascular: Atherosclerosis of skullbase vasculature without hyperdense vessel or abnormal calcification. Skull: No fracture or focal lesion. Sinuses/Orbits: No acute finding. Chronic defect of the right lamina preparation. Other: None. IMPRESSION: 1. No acute intracranial abnormality. 2. Generalized atrophy and chronic small vessel ischemia. Electronically Signed   By: Andrea Gasman M.D.   On: 10/25/2024 19:54   DG Chest Portable 1 View Result Date: 10/25/2024 CLINICAL DATA:  Shortness of breath and failure to thrive. Multiple myeloma. EXAM: PORTABLE CHEST 1 VIEW COMPARISON:  03/14/2022 FINDINGS: Patient is rotated to the right. Metallic device overlies the lateral right lower lung. Several metallic objects project over the right thorax including a Bobby pin. Lungs are hypoinflated without acute airspace consolidation or effusion. Cardiomediastinal silhouette and remainder of the exam is unchanged. IMPRESSION: Hypoinflation without acute cardiopulmonary disease. Electronically Signed   By: Toribio Agreste M.D.   On: 10/25/2024 18:42        Scheduled Meds:  anastrozole   1 mg Oral Daily   aspirin  EC  81 mg Oral Daily   atorvastatin   10 mg Oral Daily   feeding supplement  237 mL Oral BID BM   insulin  aspart  0-9 Units Subcutaneous TID WC   memantine   5 mg Oral Daily   pantoprazole   40 mg Oral Daily   Continuous Infusions:  cefTRIAXone  (ROCEPHIN )  IV 1 g (10/26/24 1003)     LOS: 1 day    Time spent: 55 minutes    Taelyn Nemes D Maree, DO [facility]  If 7PM-7AM, please contact night-coverage [email or web address] 10/26/2024, 10:49 AM

## 2024-10-26 NOTE — Evaluation (Signed)
 Physical Therapy Evaluation Patient Details Name: Brittany Archer MRN: [id number] DOB: [birth year] Today's Date: 10/26/2024  History of Present Illness  Brittany Archer is a 83 y.o. female with medical history significant of CAD, type 2 diabetes mellitus, multiple myeloma, bilateral breast cancer, CKD stage IIIb, GERD, L2 compression fracture who presents to the emergency department from home via EMS for evaluation of weakness.  At bedside, patient states that she does not know why she was brought to the ED probably due to her history of dementia, but she complained of generalized weakness.  History was obtained from ED physician and ED medical record.  Per report, patient lives with sister who reports that patient has not been eating or drinking since last 2 to 3 days and now has increasingly become weaker.  Patient usually get up from the bed sometimes at baseline, but has not been doing so since last 2 to 3 days.  Sister activated EMS, so the patient can be evaluated in the ED.    Clinical Impression  Pt. Presented with general weakness in the LE. Pt was able to tolerate session well, and understood the tasks given, Pt. Was slow to EOB with supervision. For transfer from bed to chair and ambulation pt. Required CGA w/ RW. Towards the end of ambulation pt. Felt mildly fatigued. Nursing staff was notified on pt. Status. Patient will benefit from continued skilled physical therapy in hospital and recommended venue below to increase strength, balance, endurance for safe ADLs and gait.       If plan is discharge home, recommend the following: A little help with walking and/or transfers;Assistance with cooking/housework   Can travel by private vehicle    Yes    Equipment Recommendations None recommended by PT  Recommendations for Other Services       Functional Status Assessment Patient has had a recent decline in their functional status and demonstrates the ability to make significant  improvements in function in a reasonable and predictable amount of time.     Precautions / Restrictions Precautions Precautions: Fall Recall of Precautions/Restrictions: Intact Restrictions Weight Bearing Restrictions Per Provider Order: No      Mobility  Bed Mobility Overal bed mobility: Needs Assistance (supervision) Bed Mobility: Supine to Sit     Supine to sit: Supervision, Contact guard     General bed mobility comments: labored to EOB    Transfers Overall transfer level: Needs assistance Equipment used: Rolling walker (2 wheels) Transfers: Sit to/from Stand, Bed to chair/wheelchair/BSC Sit to Stand: Contact guard assist   Step pivot transfers: Contact guard assist       General transfer comment: RW w/ transfer from bed to chair    Ambulation/Gait Ambulation/Gait assistance: Contact guard assist Gait Distance (Feet): 55 Feet Assistive device: Rolling walker (2 wheels) Gait Pattern/deviations: Decreased step length - right, Decreased step length - left, Decreased stance time - right, Decreased stance time - left, Step-through pattern, Decreased stride length, Narrow base of support Gait velocity: slow     General Gait Details: Pt. was able to ambulate with RW and required contact guarding , during ambulation bc had mild fatigue towards the end.  Stairs            Wheelchair Mobility     Tilt Bed    Modified Rankin (Stroke Patients Only)       Balance Overall balance assessment: Needs assistance, Modified Independent Sitting-balance support: Feet supported, No upper extremity supported Sitting balance-Leahy Scale: Good Sitting balance - Comments:  Good/Fair EOB   Standing balance support: During functional activity Standing balance-Leahy Scale: Good Standing balance comment: Good/Fair w/RW                             Pertinent Vitals/Pain Pain Assessment Pain Assessment: No/denies pain    Home Living Family/patient expects  to be discharged to:: Private residence Living Arrangements: Other relatives (Her sister) Available Help at Discharge: Family;Available 24 hours/day Type of Home: House Home Access: Level entry       Home Layout: One level Home Equipment: Grab bars - tub/shower;Grab bars - toilet Additional Comments:  (Sister helps her)    Prior Function Prior Level of Function : Needs assist       Physical Assist : Mobility (physical);ADLs (physical) Mobility (physical): Bed mobility;Transfers;Gait;Stairs ADLs (physical): IADLs Mobility Comments: can EOB supervision, ambulate w/RW ADLs Comments: Independent with most household, assisted by family for IADL's     Extremity/Trunk Assessment        Lower Extremity Assessment Lower Extremity Assessment: Generalized weakness    Cervical / Trunk Assessment Cervical / Trunk Assessment: Kyphotic  Communication   Communication Communication: No apparent difficulties    Cognition Arousal: Alert Behavior During Therapy: WFL for tasks assessed/performed   PT - Cognitive impairments: No apparent impairments                         Following commands: Intact       Cueing Cueing Techniques: Verbal cues, Tactile cues     General Comments      Exercises     Assessment/Plan    PT Assessment Patient needs continued PT services  PT Problem List Decreased strength;Decreased range of motion;Decreased balance;Decreased activity tolerance;Decreased mobility       PT Treatment Interventions DME instruction;Gait training;Stair training;Functional mobility training;Therapeutic activities;Therapeutic exercise;Balance training;Patient/family education    PT Goals (Current goals can be found in the Care Plan section)  Acute Rehab PT Goals Patient Stated Goal: Pt. wants to return home to family PT Goal Formulation: With patient Time For Goal Achievement: 11/03/24 Potential to Achieve Goals: Good    Frequency Min 3X/week      Co-evaluation               AM-PAC PT 6 Clicks Mobility  Outcome Measure Help needed turning from your back to your side while in a flat bed without using bedrails?: None Help needed moving from lying on your back to sitting on the side of a flat bed without using bedrails?: A Little Help needed moving to and from a bed to a chair (including a wheelchair)?: A Little Help needed standing up from a chair using your arms (e.g., wheelchair or bedside chair)?: A Little Help needed to walk in hospital room?: A Little Help needed climbing 3-5 steps with a railing? : A Lot 6 Click Score: 18    End of Session   Activity Tolerance: Patient tolerated treatment well Patient left: in chair;with call bell/phone within reach Nurse Communication: Mobility status PT Visit Diagnosis: Other abnormalities of gait and mobility (R26.89);History of falling (Z91.81);Muscle weakness (generalized) (M62.81)    Time: 8980-8955 PT Time Calculation (min) (ACUTE ONLY): 25 min   Charges:   PT Evaluation $PT Eval Moderate Complexity: 1 Mod PT Treatments $Therapeutic Activity: 23-37 mins PT General Charges $$ ACUTE PT VISIT: 1 Visit       Vika Buske, SPT

## 2024-10-27 DIAGNOSIS — N10 Acute tubulo-interstitial nephritis: Secondary | ICD-10-CM

## 2024-10-27 LAB — CBC
HCT: 26.4 % — ABNORMAL LOW (ref 36.0–46.0)
Hemoglobin: 8.4 g/dL — ABNORMAL LOW (ref 12.0–15.0)
MCH: 31.8 pg (ref 26.0–34.0)
MCHC: 31.8 g/dL (ref 30.0–36.0)
MCV: 100 fL (ref 80.0–100.0)
Platelets: 87 K/uL — ABNORMAL LOW (ref 150–400)
RBC: 2.64 MIL/uL — ABNORMAL LOW (ref 3.87–5.11)
RDW: 18.4 % — ABNORMAL HIGH (ref 11.5–15.5)
WBC: 2.5 K/uL — ABNORMAL LOW (ref 4.0–10.5)
nRBC: 0 % (ref 0.0–0.2)

## 2024-10-27 LAB — BASIC METABOLIC PANEL WITH GFR
Anion gap: 9 (ref 5–15)
BUN: 15 mg/dL (ref 8–23)
CO2: 25 mmol/L (ref 22–32)
Calcium: 8.4 mg/dL — ABNORMAL LOW (ref 8.9–10.3)
Chloride: 106 mmol/L (ref 98–111)
Creatinine, Ser: 1.1 mg/dL — ABNORMAL HIGH (ref 0.44–1.00)
GFR, Estimated: 49 mL/min — ABNORMAL LOW (ref >60)
Glucose, Bld: 92 mg/dL (ref 70–99)
Potassium: 3.2 mmol/L — ABNORMAL LOW (ref 3.5–5.1)
Sodium: 139 mmol/L (ref 135–145)

## 2024-10-27 LAB — GLUCOSE, CAPILLARY
Glucose-Capillary: 96 mg/dL (ref 70–99)
Glucose-Capillary: 110 mg/dL — ABNORMAL HIGH (ref 70–99)
Glucose-Capillary: 164 mg/dL — ABNORMAL HIGH (ref 70–99)
Glucose-Capillary: 144 mg/dL — ABNORMAL HIGH (ref 70–99)

## 2024-10-27 LAB — MAGNESIUM: Magnesium: 1.9 mg/dL (ref 1.7–2.4)

## 2024-10-27 MED ORDER — POTASSIUM CHLORIDE CRYS ER 20 MEQ PO TBCR
40.0000 meq | EXTENDED_RELEASE_TABLET | Freq: Once | ORAL | Status: AC
  Administered 2024-10-27: 40 meq via ORAL
  Filled 2024-10-27: qty 2

## 2024-10-27 MED ORDER — MAGNESIUM SULFATE 2 GM/50ML IV SOLN
2.0000 g | Freq: Once | INTRAVENOUS | Status: AC
  Administered 2024-10-27: 2 g via INTRAVENOUS
  Filled 2024-10-27: qty 50

## 2024-10-27 MED ORDER — MEGESTROL ACETATE 400 MG/10ML PO SUSP
400.0000 mg | Freq: Every day | ORAL | Status: DC
Start: 2024-10-27 — End: 2024-10-30
  Administered 2024-10-27 – 2024-10-30 (×4): 400 mg via ORAL
  Filled 2024-10-27 (×4): qty 10

## 2024-10-27 MED ORDER — ADULT MULTIVITAMIN W/MINERALS CH
1.0000 | ORAL_TABLET | Freq: Every day | ORAL | Status: DC
  Administered 2024-10-27 – 2024-10-30 (×2): 1 via ORAL
  Filled 2024-10-27 (×4): qty 1

## 2024-10-27 MED ORDER — BOOST / RESOURCE BREEZE PO LIQD CUSTOM
1.0000 | Freq: Three times a day (TID) | ORAL | Status: DC
  Administered 2024-10-27 – 2024-10-28 (×2): 1 via ORAL

## 2024-10-27 NOTE — TOC Progression Note (Signed)
 Transition of Care [facility]) - Progression Note    Patient Details  Name: Brittany Archer MRN: [id number] Date of Birth: 01-01-1941  Transition of [facility]) CM/SW Contact  Lucie Lunger, [place] Phone Number: 10/27/2024, 10:39 AM  Clinical Narrative:    HH PT/OT/RN arranged for pt as sister is agreeable and has no agency preference. CSW spoke to Cory with [facility] who states they are able to accept referral at this time. TOC to follow.   Expected Discharge Plan: Home w Home Health Services Barriers to Discharge: Continued Medical Work up               Expected Discharge Plan and Services In-house Referral: Clinical Social Work   Post Acute Care Choice: Home Health Living arrangements for the past 2 months: Single Family Home                                       Social Drivers of Health (SDOH) Interventions SDOH Screenings   Food Insecurity: No Food Insecurity (10/26/2024)  Housing: Low Risk  (10/26/2024)  Transportation Needs: No Transportation Needs (10/26/2024)  Utilities: Not At Risk (10/26/2024)  Alcohol Screen: Low Risk  (12/09/2020)  Depression (PHQ2-9): Low Risk  (10/19/2024)  Financial Resource Strain: Low Risk  (12/09/2020)  Physical Activity: Inactive (12/09/2020)  Social Connections: Moderately Isolated (10/26/2024)  Stress: No Stress Concern Present (12/09/2020)  Tobacco Use: Low Risk  (10/25/2024)    Readmission Risk Interventions    10/27/2024   10:39 AM 10/26/2024   12:13 PM  Readmission Risk Prevention Plan  Transportation Screening Complete Complete  HRI or Home Care Consult Complete Complete  Social Work Consult for Recovery Care Planning/Counseling Complete Complete  Palliative Care Screening Not Applicable Not Applicable  Medication Review Oceanographer) Complete Complete

## 2024-10-27 NOTE — Progress Notes (Signed)
 PROGRESS NOTE    Patient: Brittany Archer                            PCP: Katrinka Aquas, MD                    DOB: 1941/01/07            DOA: 10/25/2024 [id number]             DOS: 10/27/2024, 11:21 AM   LOS: 1 day   Date of Service: The patient was seen and examined on 10/27/2024  Subjective:   The patient was seen and examined this morning. Hemodynamically stable.  Heart rate 54, blood pressure 132/44, satting 94% room air No issues overnight .  Brief Narrative:   Brittany Archer is a 83 y.o. female with medical history significant of CAD, type 2 diabetes mellitus, multiple myeloma, bilateral breast cancer, CKD stage IIIb, GERD, L2 compression fracture who presents to the emergency department from home via EMS for evaluation of weakness.  Patient was admitted for generalized weakness in the setting of UTI and was started on IV Rocephin  empirically.  She is also thought to have some failure to thrive.     Assessment & Plan:   Principal Problem:   UTI (urinary tract infection)     Assessment and Plan:   Presumed UTI POA Continue IV antibiotics of Rocephin , Urine culture > 100 K colonies gram-negative rods   Generalized weakness Failure to thrive in adults Protein supplement will be provided Dietitian will be consulted, we shall await further recommendation Fall precautions   Pancytopenia This may be due to chemotherapy Continue to monitor CBC    Latest Ref Rng & Units 10/27/2024    4:04 AM 10/26/2024    3:27 AM 10/25/2024    4:58 PM  CBC  WBC 4.0 - 10.5 K/uL 2.5  2.7  2.9   Hemoglobin 12.0 - 15.0 g/dL 8.4  8.8  9.7   Hematocrit 36.0 - 46.0 % 26.4  28.6  30.8   Platelets 150 - 400 K/uL 87  85  89       Macrocytic anemia MCV 102.3, vitamin B12 normal at 536, folate normal at 6.6    Chronic back pain Continue TLSO brace Continue PT and OT eval and treat   Type 2 diabetes mellitus Hemoglobin A1c on 05/29/23 was 7.4 Continue ISS and  hypoglycemia protocol Metformin  will be held at this time   CAD Mixed hyperlipidemia Continue aspirin  and atorvastatin    Advanced dementia Continue home memantine    GERD Continue pantoprazole , Pepcid    Left breast cancer Multiple Myeloma Continue home Arimidex  Continue Velcade  with [facility]     ----------------------------------------------------------------------------------------------------------------------------------------------- Nutritional status:  The patient's BMI is: Body mass index is 21.19 kg/m. I agree with the assessment and plan as outlined ---------------------------------------------------------------------------------------------------------------------------------------------------- Cultures; Blood Cultures x 2 >> Urine Culture  >>> > 100K gram-negative rods  ------------------------------------------------------------------------------------------------------------------------------------------------  DVT prophylaxis:  SCDs Start: 10/26/24 0202   Code Status:   Code Status: Full Code  Family Communication: No family member present at bedside-  -Advance care planning has been discussed.   Admission status:   Status is: Observation The patient remains OBS appropriate and will d/c before 2 midnights.   Disposition: From  - home             Planning for discharge in 1-2 days   Procedures:   No admission  procedures for hospital encounter.   Antimicrobials:  Anti-infectives (From admission, onward)    Start     Dose/Rate Route Frequency Ordered Stop   10/26/24 1000  cefTRIAXone  (ROCEPHIN ) 1 g in sodium chloride  0.9 % 100 mL IVPB        1 g 200 mL/hr over 30 Minutes Intravenous Every 24 hours 10/26/24 0229     10/25/24 2200  cefTRIAXone  (ROCEPHIN ) 2 g in sodium chloride  0.9 % 100 mL IVPB        2 g 200 mL/hr over 30 Minutes Intravenous  Once 10/25/24 2150 10/25/24 2243        Medication:   anastrozole   1 mg Oral Daily    aspirin  EC  81 mg Oral Daily   atorvastatin   10 mg Oral Daily   feeding supplement  237 mL Oral BID BM   insulin  aspart  0-9 Units Subcutaneous TID WC   megestrol  400 mg Oral Daily   memantine   5 mg Oral Daily   pantoprazole   40 mg Oral Daily    acetaminophen  **OR** acetaminophen , famotidine , liver oil-zinc  oxide, ondansetron  **OR** ondansetron  (ZOFRAN ) IV, oxyCODONE    Objective:   Vitals:   10/26/24 1500 10/26/24 2021 10/26/24 2049 10/27/24 0454  BP: 128/76 (!) 135/55 126/60 (!) 132/44  Pulse:  66 64 (!) 54  Resp: 20 20 15 17   Temp: 99 F (37.2 C) 99.3 F (37.4 C) 97.6 F (36.4 C) 97.7 F (36.5 C)  TempSrc:  Oral    SpO2: 97% 96% 96% 94%  Weight: 56 kg     Height: 5' 4 (1.626 m)       Intake/Output Summary (Last 24 hours) at 10/27/2024 1121 Last data filed at 10/27/2024 0500 Gross per 24 hour  Intake 580 ml  Output 600 ml  Net -20 ml   Filed Weights   10/25/24 1605 10/26/24 1500  Weight: 58.1 kg 56 kg     Physical examination:   General:  AAO x 1,  cooperative, no distress;   HEENT:  Normocephalic, PERRL, otherwise with in Normal limits   Neuro:  CNII-XII intact. , normal motor and sensation, reflexes intact   Lungs:   Clear to auscultation BL, Respirations unlabored,  No wheezes / crackles  Cardio:    S1/S2, RRR, No murmure, No Rubs or Gallops   Abdomen:  Soft, non-tender, bowel sounds active all four quadrants, no guarding or peritoneal signs.  Muscular  skeletal:  Thoracic back brace in place, therefore limited range of motion Limited exam -global generalized weaknesses - in bed, able to move all 4 extremities,   2+ pulses,  symmetric, No pitting edema  Skin:  Dry, warm to touch, negative for any Rashes,  Wounds: Please see nursing documentation       ------------------------------------------------------------------------------------------------------------------------------------------    LABs:     Latest Ref Rng & Units 10/27/2024    4:04  AM 10/26/2024    3:27 AM 10/25/2024    4:58 PM  CBC  WBC 4.0 - 10.5 K/uL 2.5  2.7  2.9   Hemoglobin 12.0 - 15.0 g/dL 8.4  8.8  9.7   Hematocrit 36.0 - 46.0 % 26.4  28.6  30.8   Platelets 150 - 400 K/uL 87  85  89       Latest Ref Rng & Units 10/27/2024    4:04 AM 10/26/2024    3:27 AM 10/25/2024    4:58 PM  CMP  Glucose 70 - 99 mg/dL 92  57  71  BUN 8 - 23 mg/dL 15  15  18    Creatinine 0.44 - 1.00 mg/dL 8.89  9.08  8.88   Sodium 135 - 145 mmol/L 139  139  139   Potassium 3.5 - 5.1 mmol/L 3.2  3.7  4.3   Chloride 98 - 111 mmol/L 106  106  106   CO2 22 - 32 mmol/L 25  22  22    Calcium  8.9 - 10.3 mg/dL 8.4  8.2  8.7   Total Protein 6.5 - 8.1 g/dL  4.9  5.2   Total Bilirubin 0.0 - 1.2 mg/dL  0.6  1.0   Alkaline Phos 38 - 126 U/L  108  115   AST 15 - 41 U/L  15  16   ALT 0 - 44 U/L  10  9        Micro Results Recent Results (from the past 240 hours)  Resp panel by RT-PCR (RSV, Flu A&B, Covid) Anterior Nasal Swab     Status: None   Collection Time: 10/25/24  6:37 PM   Specimen: Anterior Nasal Swab  Result Value Ref Range Status   SARS Coronavirus 2 by RT PCR NEGATIVE NEGATIVE Final    Comment: (NOTE) SARS-CoV-2 target nucleic acids are NOT DETECTED.  The SARS-CoV-2 RNA is generally detectable in upper respiratory specimens during the acute phase of infection. The lowest concentration of SARS-CoV-2 viral copies this assay can detect is 138 copies/mL. A negative result does not preclude SARS-Cov-2 infection and should not be used as the sole basis for treatment or other patient management decisions. A negative result may occur with  improper specimen collection/handling, submission of specimen other than nasopharyngeal swab, presence of viral mutation(s) within the areas targeted by this assay, and inadequate number of viral copies(<138 copies/mL). A negative result must be combined with clinical observations, patient history, and epidemiological information. The  expected result is Negative.  Fact Sheet for Patients:  bloggercourse.com  Fact Sheet for Healthcare Providers:  seriousbroker.it  This test is no t yet approved or cleared by the [place]  FDA and  has been authorized for detection and/or diagnosis of SARS-CoV-2 by FDA under an Emergency Use Authorization (EUA). This EUA will remain  in effect (meaning this test can be used) for the duration of the COVID-19 declaration under Section 564(b)(1) of the Act, 21 U.S.C.section 360bbb-3(b)(1), unless the authorization is terminated  or revoked sooner.       Influenza A by PCR NEGATIVE NEGATIVE Final   Influenza B by PCR NEGATIVE NEGATIVE Final    Comment: (NOTE) The Xpert Xpress SARS-CoV-2/FLU/RSV plus assay is intended as an aid in the diagnosis of influenza from Nasopharyngeal swab specimens and should not be used as a sole basis for treatment. Nasal washings and aspirates are unacceptable for Xpert Xpress SARS-CoV-2/FLU/RSV testing.  Fact Sheet for Patients: bloggercourse.com  Fact Sheet for Healthcare Providers: seriousbroker.it  This test is not yet approved or cleared by the [place]  FDA and has been authorized for detection and/or diagnosis of SARS-CoV-2 by FDA under an Emergency Use Authorization (EUA). This EUA will remain in effect (meaning this test can be used) for the duration of the COVID-19 declaration under Section 564(b)(1) of the Act, 21 U.S.C. section 360bbb-3(b)(1), unless the authorization is terminated or revoked.     Resp Syncytial Virus by PCR NEGATIVE NEGATIVE Final    Comment: (NOTE) Fact Sheet for Patients: bloggercourse.com  Fact Sheet for Healthcare Providers: seriousbroker.it  This test is not yet approved  or cleared by the [place]  FDA and has been authorized for detection and/or  diagnosis of SARS-CoV-2 by FDA under an Emergency Use Authorization (EUA). This EUA will remain in effect (meaning this test can be used) for the duration of the COVID-19 declaration under Section 564(b)(1) of the Act, 21 U.S.C. section 360bbb-3(b)(1), unless the authorization is terminated or revoked.  Performed at [facility], [address] [place], [place] [address]   Urine Culture     Status: Abnormal (Preliminary result)   Collection Time: 10/25/24  8:52 PM   Specimen: Urine, Random  Result Value Ref Range Status   Specimen Description   Final    URINE, RANDOM Performed at [facility], [address], [place], [place] [address]    Special Requests   Final    NONE Reflexed from [id number] Performed at [facility], [address] [place], [place] [address]    Culture (A)  Final    >=100,000 COLONIES/mL GRAM NEGATIVE RODS CULTURE REINCUBATED FOR BETTER GROWTH IDENTIFICATION AND SUSCEPTIBILITIES TO FOLLOW Performed at [facility], [address], [place], [place] [address]    Report Status PENDING  Incomplete    Radiology Reports No results found.  SIGNED: Adriana DELENA Grams, MD, FHM. FAAFP. Jolynn Pack - [facility] Time spent - 55 min.  In seeing, evaluating and examining the patient. Reviewing medical records, labs, drawn plan of care. [facility],  Pager (please use [email or web address] to page/ text) Please use Epic Secure Chat for non-urgent communication (7AM-7PM)  If 7PM-7AM, please contact night-coverage [email or web address], 10/27/2024, 11:21 AM

## 2024-10-27 NOTE — Plan of Care (Signed)
  Problem: Skin Integrity: Goal: Risk for impaired skin integrity will decrease Outcome: Progressing   Problem: Education: Goal: Knowledge of General Education information will improve Description: Including pain rating scale, medication(s)/side effects and non-pharmacologic comfort measures Outcome: Progressing   Problem: Health Behavior/Discharge Planning: Goal: Ability to manage health-related needs will improve Outcome: Progressing

## 2024-10-27 NOTE — Evaluation (Signed)
 Occupational Therapy Evaluation Patient Details Name: Brittany Archer MRN: [id number] DOB: 02/29/1941 Today's Date: 10/27/2024   History of Present Illness   Brittany Archer is a 83 y.o. female with medical history significant of CAD, type 2 diabetes mellitus, multiple myeloma, bilateral breast cancer, CKD stage IIIb, GERD, L2 compression fracture who presents to the emergency department from home via EMS for evaluation of weakness.  At bedside, patient states that she does not know why she was brought to the ED probably due to her history of dementia, but she complained of generalized weakness.  History was obtained from ED physician and ED medical record.  Per report, patient lives with sister who reports that patient has not been eating or drinking since last 2 to 3 days and now has increasingly become weaker.  Patient usually get up from the bed sometimes at baseline, but has not been doing so since last 2 to 3 days.  Sister activated EMS, so the patient can be evaluated in the ED.     Clinical Impressions Pt admitted for concerns listed above. PTA pt reported that she was independent with all ADL's and functional mobility, with her sister helping as needed. At this time, pt benefitting from supervision to contact guard, for safety with all functional mobility and standing ADL's. Recommending skilled OT follow pt once returned home, acute OT will continue to follow while in hospital.      If plan is discharge home, recommend the following:   A little help with walking and/or transfers;A little help with bathing/dressing/bathroom;Assistance with cooking/housework;Direct supervision/assist for medications management;Help with stairs or ramp for entrance     Functional Status Assessment   Patient has had a recent decline in their functional status and demonstrates the ability to make significant improvements in function in a reasonable and predictable amount of time.     Equipment  Recommendations   None recommended by OT     Recommendations for Other Services         Precautions/Restrictions   Precautions Precautions: Fall Recall of Precautions/Restrictions: Intact Restrictions Weight Bearing Restrictions Per Provider Order: No     Mobility Bed Mobility Overal bed mobility: Needs Assistance (supervision) Bed Mobility: Supine to Sit     Supine to sit: Supervision, Contact guard     General bed mobility comments: increased time    Transfers Overall transfer level: Needs assistance Equipment used: Rolling walker (2 wheels) Transfers: Sit to/from Stand Sit to Stand: Contact guard assist           General transfer comment: Pt wanted to stay in bed      Balance Overall balance assessment: Needs assistance, Modified Independent Sitting-balance support: Feet supported, No upper extremity supported Sitting balance-Leahy Scale: Good Sitting balance - Comments: Good/Fair EOB   Standing balance support: During functional activity Standing balance-Leahy Scale: Poor Standing balance comment: Good/Fair w/RW                           ADL either performed or assessed with clinical judgement   ADL Overall ADL's : Needs assistance/impaired                                       General ADL Comments: Pt can overall complete ADL's with supervision to contact guard.     Vision Baseline Vision/History: 1 Wears glasses Ability to See in  Adequate Light: 1 Impaired Patient Visual Report: No change from baseline Vision Assessment?: No apparent visual deficits     Perception         Praxis         Pertinent Vitals/Pain Pain Assessment Pain Assessment: No/denies pain     Extremity/Trunk Assessment Upper Extremity Assessment Upper Extremity Assessment: Generalized weakness   Lower Extremity Assessment Lower Extremity Assessment: Defer to PT evaluation   Cervical / Trunk Assessment Cervical / Trunk  Assessment: Kyphotic   Communication Communication Communication: No apparent difficulties   Cognition Arousal: Alert Behavior During Therapy: WFL for tasks assessed/performed Cognition: History of cognitive impairments             OT - Cognition Comments: Pt has dementia                 Following commands: Intact       Cueing  General Comments   Cueing Techniques: Verbal cues;Tactile cues  VSS on RA   Exercises     Shoulder Instructions      Home Living Family/patient expects to be discharged to:: Private residence Living Arrangements: Other relatives (Her sister) Available Help at Discharge: Family;Available 24 hours/day Type of Home: House Home Access: Level entry     Home Layout: One level     Bathroom Shower/Tub: Chief Strategy Officer: Standard Bathroom Accessibility: Yes   Home Equipment: Grab bars - tub/shower;Grab bars - toilet   Additional Comments: Sister helps as needed      Prior Functioning/Environment Prior Level of Function : Needs assist               ADLs Comments: Independent with most household, assisted by family for IADL's    OT Problem List: Decreased strength;Decreased activity tolerance;Impaired balance (sitting and/or standing);Decreased knowledge of use of DME or AE;Decreased safety awareness;Impaired UE functional use   OT Treatment/Interventions: Self-care/ADL training;Therapeutic exercise;Energy conservation;Neuromuscular education;DME and/or AE instruction;Therapeutic activities;Patient/family education      OT Goals(Current goals can be found in the care plan section)   Acute Rehab OT Goals Patient Stated Goal: To go home OT Goal Formulation: With patient Time For Goal Achievement: 11/10/24 Potential to Achieve Goals: Good ADL Goals Pt Will Perform Grooming: with modified independence;standing Pt Will Perform Lower Body Bathing: with modified independence;sit to/from stand;sitting/lateral  leans Pt Will Perform Lower Body Dressing: with modified independence;sit to/from stand;sitting/lateral leans Pt Will Transfer to Toilet: with modified independence;ambulating Pt Will Perform Toileting - Clothing Manipulation and hygiene: with modified independence;sitting/lateral leans;sit to/from stand   OT Frequency:  Min 1X/week    Co-evaluation              AM-PAC OT 6 Clicks Daily Activity     Outcome Measure Help from another person eating meals?: None Help from another person taking care of personal grooming?: A Little Help from another person toileting, which includes using toliet, bedpan, or urinal?: A Little Help from another person bathing (including washing, rinsing, drying)?: A Little Help from another person to put on and taking off regular upper body clothing?: A Little Help from another person to put on and taking off regular lower body clothing?: A Little 6 Click Score: 19   End of Session Equipment Utilized During Treatment: Rolling walker (2 wheels) Nurse Communication: Mobility status  Activity Tolerance: Patient tolerated treatment well Patient left: in bed;with call bell/phone within reach;with bed alarm set  OT Visit Diagnosis: Unsteadiness on feet (R26.81);Other abnormalities of gait and mobility (R26.89);Muscle  weakness (generalized) (M62.81)                Time: 9054-9041 OT Time Calculation (min): 13 min Charges:  OT General Charges $OT Visit: 1 Visit OT Evaluation $OT Eval Moderate Complexity: 1 Mod  Jorden Minchey, OTR/L [facility]  Danae Oland Elane Thelbert 10/27/2024, 11:39 AM

## 2024-10-27 NOTE — Progress Notes (Addendum)
 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  GOAL:   Patient will meet greater than or equal to 90% of their needs  MONITOR:   PO intake, Supplement acceptance  REASON FOR ASSESSMENT:   Consult Assessment of nutrition requirement/status  ASSESSMENT:   83 yo female admitted with UTI, FTT. PMH includes DM, CAD, breast cancer.  Patient has dementia and lives with her sister. On admission, sister reported that patient had not been eating or drinking for 2-3 days PTA and had become weaker.   Patient states that she has been eating okay since admission. She endorses some weight loss. She likes the taste of Ensure supplements, but they give her diarrhea, so she doesn't like to drink them often. She likes ice cream, all flavors and agreed to try [employer] and [employer] clear liquid supplement.  Labs reviewed.  K 3.2 Phos 2.8 Mag 1.9 CBG: 137-104-96  Medications reviewed and include novolog , megace, protonix , klor-con , IV magnesium  sulfate.  Weight history reviewed.  Patient has had 13% weight loss over the past 3 months and 20% weight loss over the past 6 months.  Patient meets criteria for severe malnutrition, given severe depletion of muscle and subcutaneous fat mass with 20% weight loss x 6 months.  NUTRITION - FOCUSED PHYSICAL EXAM:  Flowsheet Row Most Recent Value  Orbital Region Moderate depletion  Upper Arm Region Severe  depletion  Thoracic and Lumbar Region Severe depletion  Buccal Region Severe depletion  Temple Region Severe depletion  Clavicle Bone Region Severe depletion  Clavicle and Acromion Bone Region Severe depletion  Scapular Bone Region Severe depletion  Dorsal Hand Moderate depletion  Patellar Region Severe depletion  Anterior Thigh Region Severe depletion  Posterior Calf Region Severe depletion  Edema (RD Assessment) None  Hair Reviewed  Eyes Reviewed  Mouth Reviewed  Skin Reviewed  Nails Reviewed    Diet Order:   Diet Order             Diet Heart Room service appropriate? Yes; Fluid consistency: Thin  Diet effective now                   EDUCATION NEEDS:   No education needs have been identified at this time  Skin:  Skin Assessment: Reviewed RN Assessment  Last BM:  10/30 type 4  Height:   Ht Readings from Last 1 Encounters:  10/26/24 5' 4 (1.626 m)    Weight:   Wt Readings from Last 1 Encounters:  10/26/24 56 kg    Ideal Body Weight:  54.5 kg  BMI:  Body mass index is 21.19 kg/m.  Estimated Nutritional Needs:   Kcal:  1400-1600  Protein:  70-80 gm  Fluid:  >/= 1.5 L   Suzen HUNT RD, LDN, CNSC Contact via secure chat. If unavailable, use group chat RD Inpatient.

## 2024-10-28 DIAGNOSIS — N10 Acute tubulo-interstitial nephritis: Secondary | ICD-10-CM | POA: Diagnosis not present

## 2024-10-28 DIAGNOSIS — R5381 Other malaise: Secondary | ICD-10-CM | POA: Diagnosis not present

## 2024-10-28 LAB — BASIC METABOLIC PANEL WITH GFR
Anion gap: 8 (ref 5–15)
BUN: 9 mg/dL (ref 8–23)
CO2: 24 mmol/L (ref 22–32)
Calcium: 7.9 mg/dL — ABNORMAL LOW (ref 8.9–10.3)
Chloride: 106 mmol/L (ref 98–111)
Creatinine, Ser: 0.9 mg/dL (ref 0.44–1.00)
GFR, Estimated: >60 mL/min (ref >60)
Glucose, Bld: 97 mg/dL (ref 70–99)
Potassium: 3.1 mmol/L — ABNORMAL LOW (ref 3.5–5.1)
Sodium: 138 mmol/L (ref 135–145)

## 2024-10-28 LAB — CBC
HCT: 23.9 % — ABNORMAL LOW (ref 36.0–46.0)
Hemoglobin: 7.5 g/dL — ABNORMAL LOW (ref 12.0–15.0)
MCH: 31.8 pg (ref 26.0–34.0)
MCHC: 31.4 g/dL (ref 30.0–36.0)
MCV: 101.3 fL — ABNORMAL HIGH (ref 80.0–100.0)
Platelets: 87 K/uL — ABNORMAL LOW (ref 150–400)
RBC: 2.36 MIL/uL — ABNORMAL LOW (ref 3.87–5.11)
RDW: 18.1 % — ABNORMAL HIGH (ref 11.5–15.5)
WBC: 2.4 K/uL — ABNORMAL LOW (ref 4.0–10.5)
nRBC: 0 % (ref 0.0–0.2)

## 2024-10-28 LAB — URINE CULTURE: Culture: >=100000 — AB

## 2024-10-28 LAB — MAGNESIUM: Magnesium: 2 mg/dL (ref 1.7–2.4)

## 2024-10-28 LAB — GLUCOSE, CAPILLARY
Glucose-Capillary: 170 mg/dL — ABNORMAL HIGH (ref 70–99)
Glucose-Capillary: 84 mg/dL (ref 70–99)
Glucose-Capillary: 88 mg/dL (ref 70–99)
Glucose-Capillary: 103 mg/dL — ABNORMAL HIGH (ref 70–99)

## 2024-10-28 LAB — IRON AND TIBC: Iron: 39 ug/dL (ref 28–170)

## 2024-10-28 LAB — FOLATE: Folate: 3.8 ng/mL — ABNORMAL LOW (ref >5.9)

## 2024-10-28 LAB — VITAMIN B12: Vitamin B-12: 672 pg/mL (ref 180–914)

## 2024-10-28 MED ORDER — POTASSIUM CHLORIDE CRYS ER 20 MEQ PO TBCR
40.0000 meq | EXTENDED_RELEASE_TABLET | Freq: Once | ORAL | Status: AC
  Administered 2024-10-28: 40 meq via ORAL
  Filled 2024-10-28: qty 2

## 2024-10-28 NOTE — Progress Notes (Signed)
 PROGRESS NOTE    Patient: Brittany Archer                            PCP: Katrinka Aquas, MD                    DOB: 07/05/1941            DOA: 10/25/2024 FMW:[id number]             DOS: 10/28/2024, 10:16 AM   LOS: 1 day   Date of Service: The patient was seen and examined on 10/28/2024  Subjective:   The patient was seen and examined this morning, stable no acute distress Temp 99.1, pulse 57 otherwise hemodynamically stable satting 98% on room air Noted for drop in hemoglobin 7.5 nursing report of no active melena ordered blood per rectum  Brief Narrative:   Brittany Archer is a 83 y.o. female with medical history significant of CAD, type 2 diabetes mellitus, multiple myeloma, bilateral breast cancer, CKD stage IIIb, GERD, L2 compression fracture who presents to the emergency department from home via EMS for evaluation of weakness.  Patient was admitted for generalized weakness in the setting of UTI and was started on IV Rocephin  empirically.  She is also thought to have some failure to thrive.     Assessment & Plan:   Principal Problem:   UTI (urinary tract infection)     Assessment and Plan:   Presumed UTI POA Continue IV antibiotics of Rocephin , Urine culture > 100 K colonies E. coli, pansensitive Tmax 99.1, WBC 2.4   Generalized weakness Failure to thrive in adults Protein supplement will be provided Dietitian will be consulted, we shall await further recommendation Fall precautions   Pancytopenia This may be due to chemotherapy Continue to monitor CBC    Latest Ref Rng & Units 10/28/2024    3:27 AM 10/27/2024    4:04 AM 10/26/2024    3:27 AM  CBC  WBC 4.0 - 10.5 K/uL 2.4  2.5  2.7   Hemoglobin 12.0 - 15.0 g/dL 7.5  8.4  8.8   Hematocrit 36.0 - 46.0 % 23.9  26.4  28.6   Platelets 150 - 400 K/uL 87  87  85       Macrocytic anemia MCV 102.3, vitamin B12 normal at 536, folate normal at 6.6 Obtaining iron level Hemoccult    Chronic back  pain Continue TLSO brace Continue PT and OT eval and treat   Type 2 diabetes mellitus Hemoglobin A1c on 05/29/23 was 7.4 Continue ISS and hypoglycemia protocol Metformin  will be held at this time   CAD Mixed hyperlipidemia Continue aspirin  and atorvastatin    Advanced dementia Continue home memantine    GERD Continue pantoprazole , Pepcid    Left breast cancer Multiple Myeloma Continue home Arimidex  Continue Velcade  with [facility]     ----------------------------------------------------------------------------------------------------------------------------------------------- Nutritional status:  The patient's BMI is: Body mass index is 21.19 kg/m. I agree with the assessment and plan as outlined ---------------------------------------------------------------------------------------------------------------------------------------------------- Cultures; Blood Cultures x 2 >> Urine Culture  >>> > 100K gram-negative rods  ------------------------------------------------------------------------------------------------------------------------------------------------  DVT prophylaxis:  SCDs Start: 10/26/24 0202   Code Status:   Code Status: Full Code  Family Communication: No family member present at bedside-  -Advance care planning has been discussed.   Admission status:   Status is: Observation The patient remains OBS appropriate and will d/c before 2 midnights.   Disposition: From  - home  Planning for discharge in 1-2 days   Procedures:   No admission procedures for hospital encounter.   Antimicrobials:  Anti-infectives (From admission, onward)    Start     Dose/Rate Route Frequency Ordered Stop   10/26/24 1000  cefTRIAXone  (ROCEPHIN ) 1 g in sodium chloride  0.9 % 100 mL IVPB        1 g 200 mL/hr over 30 Minutes Intravenous Every 24 hours 10/26/24 0229     10/25/24 2200  cefTRIAXone  (ROCEPHIN ) 2 g in sodium chloride  0.9 % 100 mL IVPB         2 g 200 mL/hr over 30 Minutes Intravenous  Once 10/25/24 2150 10/25/24 2243        Medication:   anastrozole   1 mg Oral Daily   aspirin  EC  81 mg Oral Daily   atorvastatin   10 mg Oral Daily   feeding supplement  1 Container Oral TID BM   insulin  aspart  0-9 Units Subcutaneous TID WC   megestrol  400 mg Oral Daily   memantine   5 mg Oral Daily   multivitamin with minerals  1 tablet Oral Daily   pantoprazole   40 mg Oral Daily    acetaminophen  **OR** acetaminophen , famotidine , liver oil-zinc  oxide, ondansetron  **OR** ondansetron  (ZOFRAN ) IV, oxyCODONE    Objective:   Vitals:   10/27/24 0454 10/27/24 1417 10/27/24 2045 10/28/24 0534  BP: (!) 132/44 139/70 134/60 139/70  Pulse: (!) 54 67 64 (!) 57  Resp: 17 18 20 16   Temp: 97.7 F (36.5 C) 97.9 F (36.6 C) 99 F (37.2 C) 99.1 F (37.3 C)  TempSrc:   Oral Oral  SpO2: 94% 100% 100% 98%  Weight:      Height:        Intake/Output Summary (Last 24 hours) at 10/28/2024 1016 Last data filed at 10/28/2024 0844 Gross per 24 hour  Intake 780 ml  Output --  Net 780 ml   Filed Weights   10/25/24 1605 10/26/24 1500  Weight: 58.1 kg 56 kg     Physical examination:        General:  AAO x 2,  cooperative, no distress;   HEENT:  Normocephalic, PERRL, otherwise with in Normal limits   Neuro:  CNII-XII intact. , normal motor and sensation, reflexes intact   Lungs:   Clear to auscultation BL, Respirations unlabored,  No wheezes / crackles  Cardio:    S1/S2, RRR, No murmure, No Rubs or Gallops   Abdomen:  Soft, non-tender, bowel sounds active all four quadrants, no guarding or peritoneal signs.  Muscular  skeletal:  Chronic back pain with back brace in place, limited range of motion Limited exam -global generalized weaknesses - in bed, able to move all 4 extremities,   2+ pulses,  symmetric, No pitting edema  Skin:  Dry, warm to touch, negative for any Rashes,  Wounds: Please see nursing documentation          ------------------------------------------------------------------------------------------------------------------------------------------    LABs:     Latest Ref Rng & Units 10/28/2024    3:27 AM 10/27/2024    4:04 AM 10/26/2024    3:27 AM  CBC  WBC 4.0 - 10.5 K/uL 2.4  2.5  2.7   Hemoglobin 12.0 - 15.0 g/dL 7.5  8.4  8.8   Hematocrit 36.0 - 46.0 % 23.9  26.4  28.6   Platelets 150 - 400 K/uL 87  87  85       Latest Ref Rng & Units 10/28/2024  3:27 AM 10/27/2024    4:04 AM 10/26/2024    3:27 AM  CMP  Glucose 70 - 99 mg/dL 97  92  57   BUN 8 - 23 mg/dL 9  15  15    Creatinine 0.44 - 1.00 mg/dL 9.09  8.89  9.08   Sodium 135 - 145 mmol/L 138  139  139   Potassium 3.5 - 5.1 mmol/L 3.1  3.2  3.7   Chloride 98 - 111 mmol/L 106  106  106   CO2 22 - 32 mmol/L 24  25  22    Calcium  8.9 - 10.3 mg/dL 7.9  8.4  8.2   Total Protein 6.5 - 8.1 g/dL   4.9   Total Bilirubin 0.0 - 1.2 mg/dL   0.6   Alkaline Phos 38 - 126 U/L   108   AST 15 - 41 U/L   15   ALT 0 - 44 U/L   10        Micro Results  Urine culture > 100 K colony E. coli/gram-positive cocci -pansensitive Respiratory panel (RSV, flu A and B, COVID-all negative   Radiology Reports No results found.  SIGNED: Adriana DELENA Grams, MD, FHM. FAAFP. Jolynn Pack - [facility] Time spent - 55 min.  In seeing, evaluating and examining the patient. Reviewing medical records, labs, drawn plan of care. [facility],  Pager (please use [email or web address] to page/ text) Please use Epic Secure Chat for non-urgent communication (7AM-7PM)  If 7PM-7AM, please contact night-coverage [email or web address], 10/28/2024, 10:16 AM

## 2024-10-28 NOTE — Progress Notes (Signed)
 Patient assessed under her back brace. No wounds. Some dryness to under right breast.

## 2024-10-28 NOTE — Plan of Care (Signed)
  Problem: Skin Integrity: Goal: Risk for impaired skin integrity will decrease Outcome: Progressing   Problem: Education: Goal: Knowledge of General Education information will improve Description: Including pain rating scale, medication(s)/side effects and non-pharmacologic comfort measures Outcome: Progressing   Problem: Nutrition: Goal: Adequate nutrition will be maintained Outcome: Progressing   Problem: Safety: Goal: Ability to remain free from injury will improve Outcome: Progressing

## 2024-10-28 NOTE — Plan of Care (Signed)
  Problem: Education: Goal: Ability to describe self-care measures that may prevent or decrease complications (Diabetes Survival Skills Education) will improve Outcome: Progressing   Problem: Coping: Goal: Ability to adjust to condition or change in health will improve Outcome: Progressing   Problem: Fluid Volume: Goal: Ability to maintain a balanced intake and output will improve Outcome: Progressing   Problem: Health Behavior/Discharge Planning: Goal: Ability to identify and utilize available resources and services will improve Outcome: Progressing Goal: Ability to manage health-related needs will improve Outcome: Progressing   Problem: Metabolic: Goal: Ability to maintain appropriate glucose levels will improve Outcome: Progressing   Problem: Nutritional: Goal: Maintenance of adequate nutrition will improve Outcome: Progressing Goal: Progress toward achieving an optimal weight will improve Outcome: Progressing   Problem: Skin Integrity: Goal: Risk for impaired skin integrity will decrease Outcome: Progressing   Problem: Tissue Perfusion: Goal: Adequacy of tissue perfusion will improve Outcome: Progressing   Problem: Education: Goal: Knowledge of General Education information will improve Description: Including pain rating scale, medication(s)/side effects and non-pharmacologic comfort measures Outcome: Progressing   Problem: Health Behavior/Discharge Planning: Goal: Ability to manage health-related needs will improve Outcome: Progressing   Problem: Clinical Measurements: Goal: Ability to maintain clinical measurements within normal limits will improve Outcome: Progressing Goal: Will remain free from infection Outcome: Progressing Goal: Diagnostic test results will improve Outcome: Progressing Goal: Respiratory complications will improve Outcome: Progressing Goal: Cardiovascular complication will be avoided Outcome: Progressing   Problem: Activity: Goal:  Risk for activity intolerance will decrease Outcome: Progressing   Problem: Nutrition: Goal: Adequate nutrition will be maintained Outcome: Progressing   Problem: Elimination: Goal: Will not experience complications related to bowel motility Outcome: Progressing Goal: Will not experience complications related to urinary retention Outcome: Progressing   Problem: Pain Managment: Goal: General experience of comfort will improve and/or be controlled Outcome: Progressing   Problem: Safety: Goal: Ability to remain free from injury will improve Outcome: Progressing

## 2024-10-29 DIAGNOSIS — N3000 Acute cystitis without hematuria: Secondary | ICD-10-CM | POA: Diagnosis not present

## 2024-10-29 DIAGNOSIS — R5381 Other malaise: Secondary | ICD-10-CM | POA: Diagnosis not present

## 2024-10-29 LAB — GLUCOSE, CAPILLARY
Glucose-Capillary: 94 mg/dL (ref 70–99)
Glucose-Capillary: 80 mg/dL (ref 70–99)
Glucose-Capillary: 98 mg/dL (ref 70–99)
Glucose-Capillary: 81 mg/dL (ref 70–99)

## 2024-10-29 LAB — BASIC METABOLIC PANEL WITH GFR
Anion gap: 9 (ref 5–15)
BUN: 9 mg/dL (ref 8–23)
CO2: 22 mmol/L (ref 22–32)
Calcium: 8.1 mg/dL — ABNORMAL LOW (ref 8.9–10.3)
Chloride: 108 mmol/L (ref 98–111)
Creatinine, Ser: 0.91 mg/dL (ref 0.44–1.00)
GFR, Estimated: >60 mL/min (ref >60)
Glucose, Bld: 82 mg/dL (ref 70–99)
Potassium: 3.4 mmol/L — ABNORMAL LOW (ref 3.5–5.1)
Sodium: 139 mmol/L (ref 135–145)

## 2024-10-29 LAB — CBC
HCT: 24.5 % — ABNORMAL LOW (ref 36.0–46.0)
Hemoglobin: 7.9 g/dL — ABNORMAL LOW (ref 12.0–15.0)
MCH: 31.9 pg (ref 26.0–34.0)
MCHC: 32.2 g/dL (ref 30.0–36.0)
MCV: 98.8 fL (ref 80.0–100.0)
Platelets: 102 K/uL — ABNORMAL LOW (ref 150–400)
RBC: 2.48 MIL/uL — ABNORMAL LOW (ref 3.87–5.11)
RDW: 18.1 % — ABNORMAL HIGH (ref 11.5–15.5)
WBC: 2.4 K/uL — ABNORMAL LOW (ref 4.0–10.5)
nRBC: 0 % (ref 0.0–0.2)

## 2024-10-29 MED ORDER — CIPROFLOXACIN HCL 250 MG PO TABS
500.0000 mg | ORAL_TABLET | Freq: Two times a day (BID) | ORAL | Status: DC
  Administered 2024-10-29 – 2024-10-30 (×3): 500 mg via ORAL
  Filled 2024-10-29 (×3): qty 2

## 2024-10-29 MED ORDER — POTASSIUM CHLORIDE CRYS ER 20 MEQ PO TBCR
40.0000 meq | EXTENDED_RELEASE_TABLET | Freq: Once | ORAL | Status: AC
  Administered 2024-10-29: 40 meq via ORAL
  Filled 2024-10-29: qty 2

## 2024-10-29 MED ORDER — FLORANEX PO PACK
1.0000 g | PACK | Freq: Three times a day (TID) | ORAL | Status: DC
  Administered 2024-10-29 – 2024-10-30 (×4): 1 g via ORAL
  Filled 2024-10-29 (×4): qty 1

## 2024-10-29 MED ORDER — MEMANTINE HCL 5 MG PO TABS: 5.0000 mg | ORAL_TABLET | Freq: Every day | ORAL | 0 refills | Status: DC

## 2024-10-29 MED ORDER — CIPROFLOXACIN HCL 500 MG PO TABS: 500.0000 mg | ORAL_TABLET | Freq: Two times a day (BID) | ORAL | 0 refills | Status: DC

## 2024-10-29 MED ORDER — MEGESTROL ACETATE 400 MG/10ML PO SUSP: 400.0000 mg | Freq: Every day | ORAL | 1 refills | Status: DC

## 2024-10-29 NOTE — TOC Transition Note (Addendum)
 Transition of Care [facility]) - Progress Note   Patient Details  Name: Brittany Archer MRN: [id number] Date of Birth: 08/18/1941  [facility] CM/SW Contact:  Noreen KATHEE Cleotilde ISRAEL Phone Number: 10/29/2024, 10:15 AM   Clinical Narrative:     Patient is DC today . CSW sent Darleene with [facility] a message to update him on DC for today. CSW also attempted to reach patient sister and no response, phone just kept ringing. CSW signing off.   Addendum 3:33 pm   CSW went back to patient room and family was at bedside. Spoke with patient sister via phone and patient niece was also present in the room about SNF for rehab since she cannot care for patient the way she is. Sister stated that SNF for short-term rehab would be more appropriate for now and her choices are [facility] [place], and [facility]. CSW also shared that it will be based on patient insurance but will  start referral. ICM will continue to follow.   Final next level of care: Home w Home Health Services Barriers to Discharge: Barriers Resolved   Patient Goals and CMS Choice Patient states their goals for this hospitalization and ongoing recovery are:: return home   Choice offered to / list presented to : Sibling [place] ownership interest in [facility].provided to::  (n/a)    Discharge Placement                  Name of family member notified: Attempted to reach sister Mae Patient and family notified of of transfer: 10/29/24  Discharge Plan and Services Additional resources added to the After Visit Summary for   In-house Referral: Clinical Social Work   Post Acute Care Choice: Home Health                    HH Arranged: PT, OT, RN [facility] Agency: [facility] Date [facility] Agency Contacted: 10/29/24 Time HH Agency Contacted: 1014 Representative spoke with at [facility] Agency: Darleene  Social Drivers of Health (SDOH) Interventions SDOH Screenings   Food Insecurity: No Food Insecurity (10/26/2024)  Housing: Low  Risk  (10/26/2024)  Transportation Needs: No Transportation Needs (10/26/2024)  Utilities: Not At Risk (10/26/2024)  Alcohol Screen: Low Risk  (12/09/2020)  Depression (PHQ2-9): Low Risk  (10/19/2024)  Financial Resource Strain: Low Risk  (12/09/2020)  Physical Activity: Inactive (12/09/2020)  Social Connections: Moderately Isolated (10/26/2024)  Stress: No Stress Concern Present (12/09/2020)  Tobacco Use: Low Risk  (10/25/2024)     Readmission Risk Interventions    10/29/2024   10:13 AM 10/27/2024   10:39 AM 10/26/2024   12:13 PM  Readmission Risk Prevention Plan  Transportation Screening Complete Complete Complete  HRI or Home Care Consult Complete Complete Complete  Social Work Consult for Recovery Care Planning/Counseling Complete Complete Complete  Palliative Care Screening Not Applicable Not Applicable Not Applicable  Medication Review Oceanographer) Complete Complete Complete

## 2024-10-29 NOTE — Progress Notes (Signed)
 Discharge paper work has been gone over with patient and IV has been taken out awaiting ride now

## 2024-10-29 NOTE — TOC CM/SW Note (Cosign Needed)
30 Day Note  To whom it May Concern: Please be advised that the above name patient will require a short-term nursing home stay- anticipated 30 days or less rehabilitation and strengthening. The plan is for return home.

## 2024-10-29 NOTE — NC FL2 (Signed)
 [place]  MEDICAID FL2 LEVEL OF CARE FORM     IDENTIFICATION  Patient Name: Brittany Archer Birthdate: 1941-04-04 Sex: female Admission Date (Current Location): 10/25/2024  [facility] and [place] Number:  [employer] and Address:  [facility],  [address], [place] [address]      Provider Number: [id number]  Attending Physician Name and Address:  Willette Adriana LABOR, MD  Relative Name and Phone Number:  Tonda Law ( [phone]    Current Level of Care: Hospital Recommended Level of Care: Skilled Nursing Facility Prior Approval Number:    Date Approved/Denied:   PASRR Number:    Discharge Plan: SNF    Current Diagnoses: Patient Active Problem List   Diagnosis Date Noted   Intractable pain 08/11/2024   Cystitis 05/29/2024   Closed compression fracture of L2 lumbar vertebra, initial encounter (HCC) 05/27/2024   Type 2 diabetes mellitus with hyperglycemia (HCC) 05/27/2024   GERD (gastroesophageal reflux disease) 05/27/2024   Genetic testing 05/04/2024   Breast cancer of upper-inner quadrant of left female breast (HCC) 03/30/2024   Bilateral breast cancer (HCC) 03/30/2024   Macrocytic anemia 03/14/2022   Hypoglycemia 03/14/2022   CKD (chronic kidney disease), stage III B 03/14/2022   UTI (urinary tract infection) 03/14/2022   Dementia without behavioral disturbance (HCC) 03/14/2022   Open mouth wound 11/15/2020   Encounter for peripheral line placement 03/25/2020   PICC (peripherally inserted central catheter) in place 03/18/2020   Leukopenia 03/18/2020   Osteomyelitis, jaw acute    Osteonecrosis (HCC)    Confusion 02/20/2020   Facial cellulitis 02/19/2020   Type 2 diabetes mellitus (HCC)    Coronary artery disease    Hypertension    Stage 3b chronic kidney disease (HCC)    Hypomagnesemia    Hypophosphatemia    Hypokalemia 01/03/2020   CKD (chronic kidney disease) 05/01/2019   B12 deficiency 02/09/2018   Multiple myeloma not  having achieved remission (HCC) 01/20/2018   Iron deficiency anemia 11/25/2017    Orientation RESPIRATION BLADDER Height & Weight     Self, Situation, Place  Normal External catheter Weight: 123 lb 7.3 oz (56 kg) Height:  5' 4 (162.6 cm)  BEHAVIORAL SYMPTOMS/MOOD NEUROLOGICAL BOWEL NUTRITION STATUS      Continent Diet (regular)  AMBULATORY STATUS COMMUNICATION OF NEEDS Skin   Limited Assist Verbally Skin abrasions                       Personal Care Assistance Level of Assistance  Bathing, Feeding, Dressing Bathing Assistance: Limited assistance Feeding assistance: Independent Dressing Assistance: Limited assistance     Functional Limitations Info  Sight, Hearing, Speech Sight Info: Adequate Hearing Info: Adequate Speech Info: Adequate    SPECIAL CARE FACTORS FREQUENCY  PT (By licensed PT), OT (By licensed OT)     PT Frequency: 5 x a week OT Frequency: 5 x a week            Contractures Contractures Info: Not present    Additional Factors Info  Code Status Code Status Info: FULL             Current Medications (10/29/2024):  This is the current hospital active medication list Current Facility-Administered Medications  Medication Dose Route Frequency Provider Last Rate Last Admin   acetaminophen  (TYLENOL ) tablet 650 mg  650 mg Oral Q6H PRN Adefeso, Oladapo, DO       Or   acetaminophen  (TYLENOL ) suppository 650 mg  650 mg  Rectal Q6H PRN Adefeso, Oladapo, DO       anastrozole  (ARIMIDEX ) tablet 1 mg  1 mg Oral Daily Adefeso, Oladapo, DO   1 mg at 10/29/24 0909   aspirin  EC tablet 81 mg  81 mg Oral Daily Adefeso, Oladapo, DO   81 mg at 10/29/24 0841   atorvastatin  (LIPITOR) tablet 10 mg  10 mg Oral Daily Adefeso, Oladapo, DO   10 mg at 10/29/24 0840   ciprofloxacin (CIPRO) tablet 500 mg  500 mg Oral BID Shahmehdi, Seyed A, MD   500 mg at 10/29/24 9087   famotidine  (PEPCID ) tablet 20 mg  20 mg Oral QHS PRN Adefeso, Oladapo, DO   20 mg at 10/27/24 9077    feeding supplement (BOOST / RESOURCE BREEZE) liquid 1 Container  1 Container Oral TID BM Willette Adriana LABOR, MD   1 Container at 10/28/24 0901   insulin  aspart (novoLOG ) injection 0-9 Units  0-9 Units Subcutaneous TID WC Adefeso, Oladapo, DO   2 Units at 10/28/24 0900   lactobacillus (FLORANEX/LACTINEX) granules 1 g  1 g Oral TID WC Shahmehdi, Seyed A, MD   1 g at 10/29/24 1203   liver oil-zinc  oxide (DESITIN) 40 % ointment   Topical PRN Jesus America, NP       megestrol (MEGACE) 400 MG/10ML suspension 400 mg  400 mg Oral Daily Shahmehdi, Seyed A, MD   400 mg at 10/29/24 9156   memantine  (NAMENDA ) tablet 5 mg  5 mg Oral Daily Adefeso, Oladapo, DO   5 mg at 10/29/24 0840   multivitamin with minerals tablet 1 tablet  1 tablet Oral Daily Shahmehdi, Seyed A, MD   1 tablet at 10/27/24 8161   ondansetron  (ZOFRAN ) tablet 4 mg  4 mg Oral Q6H PRN Adefeso, Oladapo, DO       Or   ondansetron  (ZOFRAN ) injection 4 mg  4 mg Intravenous Q6H PRN Adefeso, Oladapo, DO       oxyCODONE  (Oxy IR/ROXICODONE ) immediate release tablet 5 mg  5 mg Oral Q6H PRN Adefeso, Oladapo, DO       pantoprazole  (PROTONIX ) EC tablet 40 mg  40 mg Oral Daily Adefeso, Oladapo, DO   40 mg at 10/29/24 9158     Discharge Medications: Please see discharge summary for a list of discharge medications.  Relevant Imaging Results:  Relevant Lab Results:   Additional Information [id number]  Noreen KATHEE Pinal, LCSWA

## 2024-10-29 NOTE — TOC CM/SW Note (Signed)
 Dementia note  please be advised that the above-named patient has a primary diagnosis of dementia which supersedes any psychiatric diagnosis.

## 2024-10-29 NOTE — Progress Notes (Signed)
 PROGRESS NOTE    Patient: Brittany Archer                            PCP: Katrinka Aquas, MD                    DOB: 1941-08-22            DOA: 10/25/2024 FMW:[id number]             DOS: 10/29/2024, 10:26 AM   LOS: 1 day   Date of Service: The patient was seen and examined on 10/29/2024  Subjective:   The patient was seen and examined this morning, stable no acute distress Temp 99.1, pulse 57 otherwise hemodynamically stable satting 98% on room air Noted for drop in hemoglobin 7.5 nursing report of no active melena ordered blood per rectum  Brief Narrative:   Brittany Archer is a 83 y.o. female with medical history significant of CAD, type 2 diabetes mellitus, multiple myeloma, bilateral breast cancer, CKD stage IIIb, GERD, L2 compression fracture who presents to the emergency department from home via EMS for evaluation of weakness.  Patient was admitted for generalized weakness in the setting of UTI and was started on IV Rocephin  empirically.  She is also thought to have some failure to thrive.     Assessment & Plan:   Principal Problem:   UTI (urinary tract infection)   Presumed UTI POA Continue IV antibiotics of Rocephin  >>> switching to p.o. ciprofloxacin  Urine culture > 100 K colonies E. coli, pansensitive Afebrile chronic leukopenic, normotensive   Generalized weakness Failure to thrive in adults Protein supplement will be provided Dietitian will be consulted, we shall await further recommendation Fall precautions Consult PT OT, arranging home health Pancytopenia This may be due to chemotherapy Continue to monitor CBC    Latest Ref Rng & Units 10/29/2024    4:23 AM 10/28/2024    3:27 AM 10/27/2024    4:04 AM  CBC  WBC 4.0 - 10.5 K/uL 2.4  2.4  2.5   Hemoglobin 12.0 - 15.0 g/dL 7.9  7.5  8.4   Hematocrit 36.0 - 46.0 % 24.5  23.9  26.4   Platelets 150 - 400 K/uL 102  87  87         Macrocytic anemia MCV 102.3, vitamin B12 normal at 536, folate normal  at 6.6 Iron/TIBC/Ferritin/ %Sat Labs (Brief)          Component Value Date/Time    IRON 39 10/28/2024 0733    TIBC NOT CALCULATED 10/28/2024 0733    FERRITIN 1,021 (H) 10/19/2024 0817    IRONPCTSAT NOT CALCULATED 10/28/2024 0733              Chronic back pain/history of multiple myeloma Continue TLSO brace Continue PT and OT eval and treat   Type 2 diabetes mellitus Hemoglobin A1c on 05/29/23 was 7.4 Continue home regimen, diabetic diet   CAD Mixed hyperlipidemia Continue aspirin  and atorvastatin    Advanced dementia Continue home memantine    GERD Continue pantoprazole , Pepcid    Left breast cancer Multiple Myeloma Continue home Arimidex  Continue Velcade  with [facility]         ----------------------------------------------------------------------------------------------------------------------------------------------- Nutritional status:  The patient's BMI is: Body mass index is 21.19 kg/m. I agree with the assessment and plan as outlined ---------------------------------------------------------------------------------------------------------------------------------------------------- Cultures; Blood Cultures x 2 >> Urine Culture  >>> > 100K gram-negative rods  ------------------------------------------------------------------------------------------------------------------------------------------------  DVT prophylaxis:  SCDs Start: 10/26/24 0202   Code Status:   Code Status: Full Code  Family Communication: No family member present at bedside-  -Advance care planning has been discussed.   Admission status:   Status is: Observation The patient remains OBS appropriate and will d/c before 2 midnights.   Disposition: From  - home             Planning for discharge in 1-2 days   Procedures:   No admission procedures for hospital encounter.   Antimicrobials:  Anti-infectives (From admission, onward)    Start     Dose/Rate Route Frequency  Ordered Stop   10/30/24 0000  ciprofloxacin (CIPRO) 500 MG tablet        500 mg Oral 2 times daily 10/29/24 0756 11/03/24 2359   10/29/24 1000  ciprofloxacin (CIPRO) tablet 500 mg        500 mg Oral 2 times daily 10/29/24 0749     10/26/24 1000  cefTRIAXone  (ROCEPHIN ) 1 g in sodium chloride  0.9 % 100 mL IVPB  Status:  Discontinued        1 g 200 mL/hr over 30 Minutes Intravenous Every 24 hours 10/26/24 0229 10/29/24 0749   10/25/24 2200  cefTRIAXone  (ROCEPHIN ) 2 g in sodium chloride  0.9 % 100 mL IVPB        2 g 200 mL/hr over 30 Minutes Intravenous  Once 10/25/24 2150 10/25/24 2243        Medication:   anastrozole   1 mg Oral Daily   aspirin  EC  81 mg Oral Daily   atorvastatin   10 mg Oral Daily   ciprofloxacin  500 mg Oral BID   feeding supplement  1 Container Oral TID BM   insulin  aspart  0-9 Units Subcutaneous TID WC   lactobacillus  1 g Oral TID WC   megestrol  400 mg Oral Daily   memantine   5 mg Oral Daily   multivitamin with minerals  1 tablet Oral Daily   pantoprazole   40 mg Oral Daily    acetaminophen  **OR** acetaminophen , famotidine , liver oil-zinc  oxide, ondansetron  **OR** ondansetron  (ZOFRAN ) IV, oxyCODONE    Objective:   Vitals:   10/27/24 2045 10/28/24 0534 10/28/24 2049 10/29/24 0401  BP: 134/60 139/70 137/68 (!) 148/73  Pulse: 64 (!) 57 (!) 55 (!) 55  Resp: 20 16 17 15   Temp: 99 F (37.2 C) 99.1 F (37.3 C) 98.3 F (36.8 C) 98.1 F (36.7 C)  TempSrc: Oral Oral Oral Oral  SpO2: 100% 98% 98% 96%  Weight:      Height:        Intake/Output Summary (Last 24 hours) at 10/29/2024 1026 Last data filed at 10/29/2024 0456 Gross per 24 hour  Intake 720 ml  Output 200 ml  Net 520 ml   Filed Weights   10/25/24 1605 10/26/24 1500  Weight: 58.1 kg 56 kg     Physical examination:        General:  AAO x 2,  cooperative, no distress;   HEENT:  Normocephalic, PERRL, otherwise with in Normal limits   Neuro:  CNII-XII intact. , normal motor and sensation,  reflexes intact   Lungs:   Clear to auscultation BL, Respirations unlabored,  No wheezes / crackles  Cardio:    S1/S2, RRR, No murmure, No Rubs or Gallops   Abdomen:  Soft, non-tender, bowel sounds active all four quadrants, no guarding or peritoneal signs.  Muscular  skeletal:  Chronic back pain with back brace in place Limited exam -  global generalized weaknesses - in bed, able to move all 4 extremities,   2+ pulses,  symmetric, No pitting edema  Skin:  Dry, warm to touch, negative for any Rashes,  Wounds: Please see nursing documentation            ------------------------------------------------------------------------------------------------------------------------------------------    LABs:     Latest Ref Rng & Units 10/29/2024    4:23 AM 10/28/2024    3:27 AM 10/27/2024    4:04 AM  CBC  WBC 4.0 - 10.5 K/uL 2.4  2.4  2.5   Hemoglobin 12.0 - 15.0 g/dL 7.9  7.5  8.4   Hematocrit 36.0 - 46.0 % 24.5  23.9  26.4   Platelets 150 - 400 K/uL 102  87  87       Latest Ref Rng & Units 10/29/2024    4:23 AM 10/28/2024    3:27 AM 10/27/2024    4:04 AM  CMP  Glucose 70 - 99 mg/dL 82  97  92   BUN 8 - 23 mg/dL 9  9  15    Creatinine 0.44 - 1.00 mg/dL 9.08  9.09  8.89   Sodium 135 - 145 mmol/L 139  138  139   Potassium 3.5 - 5.1 mmol/L 3.4  3.1  3.2   Chloride 98 - 111 mmol/L 108  106  106   CO2 22 - 32 mmol/L 22  24  25    Calcium  8.9 - 10.3 mg/dL 8.1  7.9  8.4        Micro Results  Urine culture > 100 K colony E. coli/gram-positive cocci -pansensitive Respiratory panel (RSV, flu A and B, COVID-all negative   Radiology Reports No results found.  SIGNED: Adriana DELENA Grams, MD, FHM. FAAFP. Brittany Archer - [facility] Time spent - 55 min.  In seeing, evaluating and examining the patient. Reviewing medical records, labs, drawn plan of care. [facility],  Pager (please use [email or web address] to page/ text) Please use Epic Secure Chat for non-urgent communication  (7AM-7PM)  If 7PM-7AM, please contact night-coverage [email or web address], 10/29/2024, 10:26 AM

## 2024-10-29 NOTE — Discharge Summary (Signed)
 Physician Discharge Summary   Patient: Brittany Archer MRN: [id number] DOB: 06-23-1941  Admit date:     10/25/2024  Discharge date: 10/30/24  Discharge Physician: Adriana DELENA Grams   PCP: Katrinka Aquas, MD   Recommendations at discharge:   Follow to PCP in 1 week CBC CMP in 1 week, please continue monitoring acute on chronic anemia with history of multiple myeloma Follow-up with outpatient palliative care team Continue current medication including recommend antibiotics, Encouraging oral hydration  Discharge Diagnoses: Principal Problem:   Acute cystitis Active Problems:   Protein-calorie malnutrition, severe   Brittany Archer is a 83 y.o. female with medical history significant of CAD, type 2 diabetes mellitus, multiple myeloma, bilateral breast cancer, CKD stage IIIb, GERD, L2 compression fracture who presents to the emergency department from home via EMS for evaluation of weakness.  Patient was admitted for generalized weakness in the setting of UTI and was started on IV Rocephin  empirically.  She is also thought to have some failure to thrive    Assessment and Plan:  Presumed UTI POA Continue IV antibiotics of Rocephin  >>> switching to p.o. amoxicillin  Urine culture > 100 K colonies E. coli, pansensitive Afebrile chronic leukopenic, normotensive   Generalized weakness Failure to thrive in adults Protein supplement will be provided Dietitian will be consulted, we shall await further recommendation Fall precautions Consult PT OT, arranging home health Pancytopenia This may be due to chemotherapy Continue to monitor CBC    Latest Ref Rng & Units 10/30/2024    3:47 AM 10/29/2024    4:23 AM 10/28/2024    3:27 AM  CBC  WBC 4.0 - 10.5 K/uL 2.4  2.4  2.4   Hemoglobin 12.0 - 15.0 g/dL 7.8  7.9  7.5   Hematocrit 36.0 - 46.0 % 24.3  24.5  23.9   Platelets 150 - 400 K/uL 102  102  87       Macrocytic anemia MCV 102.3, vitamin B12 normal at 536, folate normal at  6.6 Iron/TIBC/Ferritin/ %Sat    Component Value Date/Time   IRON 39 10/28/2024 0733   TIBC NOT CALCULATED 10/28/2024 0733   FERRITIN 1,021 (H) 10/19/2024 0817   IRONPCTSAT NOT CALCULATED 10/28/2024 0733         Chronic back pain/history of multiple myeloma Continue TLSO brace Continue PT and OT eval and treat   Type 2 diabetes mellitus Hemoglobin A1c on 05/29/23 was 7.4 Continue home regimen, diabetic diet   CAD Mixed hyperlipidemia Continue aspirin  and atorvastatin    Advanced dementia Continue home memantine    GERD Continue pantoprazole , Pepcid    Left breast cancer Multiple Myeloma Continue home Arimidex  Continue Velcade  with [facility]        ----------------------------------------------------------------------------------------------------------------------------------------------- Nutritional status:  The patient's BMI is: Body mass index is 21.19 kg/m. I agree with the assessment and plan as outlined  Encouraging oral hydration, nutritional supplements  ---------------------------------------------------------------------------------------------------------------------------------------------------- Cultures; Blood Cultures x 2 >> no growth to date Urine Culture  >>> > 100K E. coli-pansensitive     Consultants: PT/OT/TOC Procedures performed: None Disposition: Home Diet recommendation:  Discharge Diet Orders (From admission, onward)     Start     Ordered   10/29/24 0000  Diet - low sodium heart healthy        10/29/24 0756           Cardiac and Carb modified diet DISCHARGE MEDICATION: Allergies as of 10/30/2024       Reactions   Motrin [ibuprofen] Rash   Seasonal  Ic [cholestatin] Other (See Comments)   Sneezing, watery eyes        Medication List     TAKE these medications    Accu-Chek Aviva Plus test strip Generic drug: glucose blood CHECK BLOOD SUGAR ONCE DAILY   acetaminophen  325 MG tablet Commonly known as:  TYLENOL  Take 2 tablets (650 mg total) by mouth in the morning, at noon, and at bedtime. What changed:  when to take this reasons to take this   amoxicillin  500 MG capsule Commonly known as: AMOXIL  Take 1 capsule (500 mg total) by mouth every 8 (eight) hours for 5 days.   anastrozole  1 MG tablet Commonly known as: ARIMIDEX  Take 1 tablet (1 mg total) by mouth daily.   Aspirin  Low Dose 81 MG tablet Generic drug: aspirin  EC Take 81 mg by mouth daily.   atorvastatin  10 MG tablet Commonly known as: LIPITOR Take 10 mg by mouth daily.   bortezomib  IV 3.5 MG injection Commonly known as: VELCADE  Inject 3.5 mg into the vein every Thursday.   lenalidomide  10 MG capsule Commonly known as: REVLIMID  Take 1 capsule (10 mg total) by mouth daily. Take daily for 21 days, with 7 days off   magnesium  oxide 400 (240 Mg) MG tablet Commonly known as: MAG-OX Take 1 tablet (400 mg total) by mouth 3 (three) times daily.   megestrol 400 MG/10ML suspension Commonly known as: MEGACE Take 10 mLs (400 mg total) by mouth daily.   memantine  5 MG tablet Commonly known as: NAMENDA  Take 1 tablet (5 mg total) by mouth daily.   metFORMIN  500 MG tablet Commonly known as: GLUCOPHAGE  Take 500 mg by mouth daily.   pantoprazole  40 MG tablet Commonly known as: PROTONIX  Take 1 tablet (40 mg total) by mouth daily.   potassium chloride  20 MEQ packet Commonly known as: KLOR-CON  Take 20 mEq by mouth 2 (two) times daily.        Contact information for follow-up providers     [facility] Follow up.   Specialty: Home Health Services Why: Agency will call to set up first home therapy visit. Contact information: [address] [place] [address] [phone]              Contact information for after-discharge care     Destination     [facility] .   Service: Skilled Nursing Contact information: [address] [place]   [address] [phone]                    Discharge Exam: Fredricka Weights   10/25/24 1605 10/26/24 1500  Weight: 58.1 kg 56 kg        General:  AAO x 1,  cooperative, no distress;   HEENT:  Normocephalic, PERRL, otherwise with in Normal limits   Neuro:  CNII-XII intact. , normal motor and sensation, reflexes intact   Lungs:   Clear to auscultation BL, Respirations unlabored,  No wheezes / crackles  Cardio:    S1/S2, RRR, No murmure, No Rubs or Gallops   Abdomen:  Soft, non-tender, bowel sounds active all four quadrants, no guarding or peritoneal signs.  Muscular  skeletal:  Limited exam -global generalized weaknesses - in bed, able to move all 4 extremities,   2+ pulses,  symmetric, No pitting edema  Skin:  Dry, warm to touch, negative for any Rashes,  Wounds: Please see nursing documentation          Condition at discharge: fair  The results of significant diagnostics from this hospitalization (including imaging, microbiology, ancillary and laboratory) are listed below for reference.   Imaging Studies: CT ABDOMEN PELVIS W CONTRAST Result Date: 10/25/2024 EXAM: CT ABDOMEN AND PELVIS WITH CONTRAST 10/25/2024 07:48:21 PM TECHNIQUE: CT of the abdomen and pelvis was performed with the administration of 100 mL of iohexol  (OMNIPAQUE ) 300 MG/ML solution. Multiplanar reformatted images are provided for review. Automated exposure control, iterative reconstruction, and/or weight-based adjustment of the mA/kV was utilized to reduce the radiation dose to as low as reasonably achievable. COMPARISON: CT abdomen and pelvis 8:15. CLINICAL HISTORY: Abdominal pain, acute, nonlocalized. Pt BIB [facility] for failure to thrive. Pt generally weak, not eating or drinking. VS WNL. FINDINGS: LOWER CHEST: No acute abnormality. LIVER: The liver is unremarkable. GALLBLADDER AND BILE DUCTS: Gallbladder is unremarkable. No biliary ductal dilatation. SPLEEN: No acute abnormality. PANCREAS: No acute  abnormality. ADRENAL GLANDS: No acute abnormality. KIDNEYS, URETERS AND BLADDER: There is a cyst in the left kidney measuring 4.1 cm. Per consensus, no follow-up is needed for simple Bosniak type 1 and 2 renal cysts, unless the patient has a malignancy history or risk factors. No stones in the kidneys or ureters. No hydronephrosis. No perinephric or periureteral stranding. There is a small focus of air in the bladder. GI AND BOWEL: Stomach demonstrates no acute abnormality. There is no bowel obstruction. The appendix is not seen. There is sigmoid and descending colon diverticulosis. There is some mild wall thickening of the transverse colon without surrounding inflammation. There are some scattered air-fluid levels within the proximal small bowel loops. There is some wall thickening of the anorectal junction. PERITONEUM AND RETROPERITONEUM: No ascites. No free air. VASCULATURE: Aorta is normal in caliber. There are atherosclerotic calcifications of the aorta. LYMPH NODES: No lymphadenopathy. REPRODUCTIVE ORGANS: No acute abnormality. BONES AND SOFT TISSUES: The bones are diffusely osteopenic. Moderate compression deformity of L5 is unchanged. Prominent Schmorl's nodes in L2 and L3 appear unchanged. There is mild compression deformity of the superior endplate of L1 which has slightly progressed. No focal soft tissue abnormality. IMPRESSION: 1. Mild wall thickening of the transverse colon without surrounding inflammation. Correlate clinically for nonspecific colitis. 2. Wall thickening of the anorectal junction. Recommend correlation with physical examination. 3. Sigmoid and descending colon diverticulosis without evidence of diverticulitis. Electronically signed by: Greig Pique MD 10/25/2024 08:12 PM EDT RP Workstation: [id number]   CT Head Wo Contrast Result Date: 10/25/2024 CLINICAL DATA:  Mental status change, unknown cause EXAM: CT HEAD WITHOUT CONTRAST TECHNIQUE: Contiguous axial images were obtained from  the base of the skull through the vertex without intravenous contrast. RADIATION DOSE REDUCTION: This exam was performed according to the departmental dose-optimization program which includes automated exposure control, adjustment of the mA and/or kV according to patient size and/or use of iterative reconstruction technique. COMPARISON:  Head CT 06/16/2023 FINDINGS: Brain: No intracranial hemorrhage, mass effect, or midline shift. Generalized atrophy. No hydrocephalus. The basilar cisterns are patent. Mild periventricular deep white matter hypodensity consistent with chronic small vessel ischemia. No evidence of territorial infarct or acute ischemia. No extra-axial or intracranial fluid collection. Vascular: Atherosclerosis of skullbase vasculature without hyperdense vessel or abnormal calcification. Skull: No fracture or focal lesion. Sinuses/Orbits: No acute finding. Chronic defect of the right lamina preparation. Other: None. IMPRESSION: 1. No acute intracranial abnormality. 2. Generalized atrophy and chronic small vessel ischemia. Electronically Signed   By: Andrea Gasman M.D.   On: 10/25/2024 19:54   DG Chest Portable 1 View Result Date: 10/25/2024  CLINICAL DATA:  Shortness of breath and failure to thrive. Multiple myeloma. EXAM: PORTABLE CHEST 1 VIEW COMPARISON:  03/14/2022 FINDINGS: Patient is rotated to the right. Metallic device overlies the lateral right lower lung. Several metallic objects project over the right thorax including a Bobby pin. Lungs are hypoinflated without acute airspace consolidation or effusion. Cardiomediastinal silhouette and remainder of the exam is unchanged. IMPRESSION: Hypoinflation without acute cardiopulmonary disease. Electronically Signed   By: Toribio Agreste M.D.   On: 10/25/2024 18:42    Microbiology: Results for orders placed or performed during the hospital encounter of 10/25/24  Resp panel by RT-PCR (RSV, Flu A&B, Covid) Anterior Nasal Swab     Status: None    Collection Time: 10/25/24  6:37 PM   Specimen: Anterior Nasal Swab  Result Value Ref Range Status   SARS Coronavirus 2 by RT PCR NEGATIVE NEGATIVE Final    Comment: (NOTE) SARS-CoV-2 target nucleic acids are NOT DETECTED.  The SARS-CoV-2 RNA is generally detectable in upper respiratory specimens during the acute phase of infection. The lowest concentration of SARS-CoV-2 viral copies this assay can detect is 138 copies/mL. A negative result does not preclude SARS-Cov-2 infection and should not be used as the sole basis for treatment or other patient management decisions. A negative result may occur with  improper specimen collection/handling, submission of specimen other than nasopharyngeal swab, presence of viral mutation(s) within the areas targeted by this assay, and inadequate number of viral copies(<138 copies/mL). A negative result must be combined with clinical observations, patient history, and epidemiological information. The expected result is Negative.  Fact Sheet for Patients:  bloggercourse.com  Fact Sheet for Healthcare Providers:  seriousbroker.it  This test is no t yet approved or cleared by the [place]  FDA and  has been authorized for detection and/or diagnosis of SARS-CoV-2 by FDA under an Emergency Use Authorization (EUA). This EUA will remain  in effect (meaning this test can be used) for the duration of the COVID-19 declaration under Section 564(b)(1) of the Act, 21 U.S.C.section 360bbb-3(b)(1), unless the authorization is terminated  or revoked sooner.       Influenza A by PCR NEGATIVE NEGATIVE Final   Influenza B by PCR NEGATIVE NEGATIVE Final    Comment: (NOTE) The Xpert Xpress SARS-CoV-2/FLU/RSV plus assay is intended as an aid in the diagnosis of influenza from Nasopharyngeal swab specimens and should not be used as a sole basis for treatment. Nasal washings and aspirates are unacceptable for  Xpert Xpress SARS-CoV-2/FLU/RSV testing.  Fact Sheet for Patients: bloggercourse.com  Fact Sheet for Healthcare Providers: seriousbroker.it  This test is not yet approved or cleared by the [place]  FDA and has been authorized for detection and/or diagnosis of SARS-CoV-2 by FDA under an Emergency Use Authorization (EUA). This EUA will remain in effect (meaning this test can be used) for the duration of the COVID-19 declaration under Section 564(b)(1) of the Act, 21 U.S.C. section 360bbb-3(b)(1), unless the authorization is terminated or revoked.     Resp Syncytial Virus by PCR NEGATIVE NEGATIVE Final    Comment: (NOTE) Fact Sheet for Patients: bloggercourse.com  Fact Sheet for Healthcare Providers: seriousbroker.it  This test is not yet approved or cleared by the [place]  FDA and has been authorized for detection and/or diagnosis of SARS-CoV-2 by FDA under an Emergency Use Authorization (EUA). This EUA will remain in effect (meaning this test can be used) for the duration of the COVID-19 declaration under Section 564(b)(1) of the Act, 21 U.S.C. section 360bbb-3(b)(1),  unless the authorization is terminated or revoked.  Performed at [facility], [address] [place], [place] [address]   Urine Culture     Status: Abnormal   Collection Time: 10/25/24  8:52 PM   Specimen: Urine, Random  Result Value Ref Range Status   Specimen Description   Final    URINE, RANDOM Performed at [name], [address], [place], [place] [address]    Special Requests   Final    NONE Reflexed from [id number] Performed at [facility], [address] [place], [place] [address]    Culture (A)  Final    >=100,000 COLONIES/mL ESCHERICHIA COLI >=100,000 COLONIES/mL AEROCOCCUS URINAE Standardized susceptibility testing for this organism is not available. Performed at [facility], [address] [place], [place] [address]    Report Status 10/28/2024 FINAL  Final   Organism ID, Bacteria ESCHERICHIA COLI (A)  Final      Susceptibility   Escherichia coli - MIC*    AMPICILLIN  <=2 SENSITIVE Sensitive     CEFAZOLIN (URINE) Value in next row Sensitive      <=1 SENSITIVEThis is a modified FDA-approved test that has been validated and its performance characteristics determined by the reporting laboratory.  This laboratory is certified under the Clinical Laboratory Improvement Amendments CLIA as qualified to perform high complexity clinical laboratory testing.    CEFEPIME Value in next row Sensitive      <=1 SENSITIVEThis is a modified FDA-approved test that has been validated and its performance characteristics determined by the reporting laboratory.  This laboratory is certified under the Clinical Laboratory Improvement Amendments CLIA as qualified to perform high complexity clinical laboratory testing.    ERTAPENEM Value in next row Sensitive      <=1 SENSITIVEThis is a modified FDA-approved test that has been validated and its performance characteristics determined by the reporting laboratory.  This laboratory is certified under the Clinical Laboratory Improvement Amendments CLIA as qualified to perform high complexity clinical laboratory testing.    CEFTRIAXONE  Value in next row Sensitive      <=1 SENSITIVEThis is a modified FDA-approved test that has been validated and its performance characteristics determined by the reporting laboratory.  This laboratory is certified under the Clinical Laboratory Improvement Amendments CLIA as qualified to perform high complexity clinical laboratory testing.    CIPROFLOXACIN Value in next row Sensitive      <=1 SENSITIVEThis is a modified FDA-approved test that has been validated and its performance characteristics determined by the reporting laboratory.  This laboratory is certified under the Clinical Laboratory Improvement Amendments CLIA as  qualified to perform high complexity clinical laboratory testing.    GENTAMICIN Value in next row Sensitive      <=1 SENSITIVEThis is a modified FDA-approved test that has been validated and its performance characteristics determined by the reporting laboratory.  This laboratory is certified under the Clinical Laboratory Improvement Amendments CLIA as qualified to perform high complexity clinical laboratory testing.    NITROFURANTOIN Value in next row Sensitive      <=1 SENSITIVEThis is a modified FDA-approved test that has been validated and its performance characteristics determined by the reporting laboratory.  This laboratory is certified under the Clinical Laboratory Improvement Amendments CLIA as qualified to perform high complexity clinical laboratory testing.    TRIMETH /SULFA  Value in next row Sensitive      <=1 SENSITIVEThis is a modified FDA-approved test that has been validated and its performance characteristics determined by the reporting laboratory.  This  laboratory is certified under the Clinical Laboratory Improvement Amendments CLIA as qualified to perform high complexity clinical laboratory testing.    AMPICILLIN /SULBACTAM Value in next row Sensitive      <=1 SENSITIVEThis is a modified FDA-approved test that has been validated and its performance characteristics determined by the reporting laboratory.  This laboratory is certified under the Clinical Laboratory Improvement Amendments CLIA as qualified to perform high complexity clinical laboratory testing.    PIP/TAZO Value in next row Sensitive      <=4 SENSITIVEThis is a modified FDA-approved test that has been validated and its performance characteristics determined by the reporting laboratory.  This laboratory is certified under the Clinical Laboratory Improvement Amendments CLIA as qualified to perform high complexity clinical laboratory testing.    MEROPENEM Value in next row Sensitive      <=4 SENSITIVEThis is a modified  FDA-approved test that has been validated and its performance characteristics determined by the reporting laboratory.  This laboratory is certified under the Clinical Laboratory Improvement Amendments CLIA as qualified to perform high complexity clinical laboratory testing.    * >=100,000 COLONIES/mL ESCHERICHIA COLI   *Note: Due to a large number of results and/or encounters for the requested time period, some results have not been displayed. A complete set of results can be found in Results Review.    Labs: CBC: Recent Labs  Lab 10/25/24 1658 10/26/24 0327 10/27/24 0404 10/28/24 0327 10/29/24 0423 10/30/24 0347  WBC 2.9* 2.7* 2.5* 2.4* 2.4* 2.4*  NEUTROABS 1.8  --   --   --   --   --   HGB 9.7* 8.8* 8.4* 7.5* 7.9* 7.8*  HCT 30.8* 28.6* 26.4* 23.9* 24.5* 24.3*  MCV 102.3* 102.5* 100.0 101.3* 98.8 100.0  PLT 89* 85* 87* 87* 102* 102*   Basic Metabolic Panel: Recent Labs  Lab 10/26/24 0327 10/27/24 0404 10/28/24 0327 10/28/24 0900 10/29/24 0423 10/30/24 0347  NA 139 139 138  --  139 137  K 3.7 3.2* 3.1*  --  3.4* 3.4*  CL 106 106 106  --  108 108  CO2 22 25 24   --  22 22  GLUCOSE 57* 92 97  --  82 85  BUN 15 15 9   --  9 11  CREATININE 0.91 1.10* 0.90  --  0.91 1.09*  CALCIUM  8.2* 8.4* 7.9*  --  8.1* 8.2*  MG 1.9 1.9  --  2.0  --   --   PHOS 2.8  --   --   --   --   --    Liver Function Tests: Recent Labs  Lab 10/25/24 1658 10/26/24 0327  AST 16 15  ALT 9 10  ALKPHOS 115 108  BILITOT 1.0 0.6  PROT 5.2* 4.9*  ALBUMIN 3.1* 2.9*   CBG: Recent Labs  Lab 10/29/24 1105 10/29/24 1608 10/29/24 2031 10/30/24 0723 10/30/24 1124  GLUCAP 80 98 81 94 121*    Discharge time spent: greater than 40 minutes.  Signed: Adriana DELENA Grams, MD [facility] 10/30/2024

## 2024-10-29 NOTE — Plan of Care (Signed)
  Problem: Coping: Goal: Ability to adjust to condition or change in health will improve Outcome: Progressing   Problem: Fluid Volume: Goal: Ability to maintain a balanced intake and output will improve Outcome: Progressing   Problem: Health Behavior/Discharge Planning: Goal: Ability to identify and utilize available resources and services will improve Outcome: Progressing   Problem: Metabolic: Goal: Ability to maintain appropriate glucose levels will improve Outcome: Progressing   Problem: Nutritional: Goal: Maintenance of adequate nutrition will improve Outcome: Progressing   Problem: Skin Integrity: Goal: Risk for impaired skin integrity will decrease Outcome: Progressing   Problem: Health Behavior/Discharge Planning: Goal: Ability to manage health-related needs will improve Outcome: Progressing   Problem: Activity: Goal: Risk for activity intolerance will decrease Outcome: Progressing

## 2024-10-30 ENCOUNTER — Encounter (HOSPITAL_COMMUNITY): Payer: Self-pay | Admitting: Internal Medicine

## 2024-10-30 DIAGNOSIS — Z7189 Other specified counseling: Secondary | ICD-10-CM | POA: Diagnosis not present

## 2024-10-30 DIAGNOSIS — E43 Unspecified severe protein-calorie malnutrition: Secondary | ICD-10-CM | POA: Insufficient documentation

## 2024-10-30 DIAGNOSIS — F03B0 Unspecified dementia, moderate, without behavioral disturbance, psychotic disturbance, mood disturbance, and anxiety: Secondary | ICD-10-CM

## 2024-10-30 DIAGNOSIS — N3000 Acute cystitis without hematuria: Secondary | ICD-10-CM | POA: Diagnosis not present

## 2024-10-30 LAB — CBC
HCT: 24.3 % — ABNORMAL LOW (ref 36.0–46.0)
Hemoglobin: 7.8 g/dL — ABNORMAL LOW (ref 12.0–15.0)
MCH: 32.1 pg (ref 26.0–34.0)
MCHC: 32.1 g/dL (ref 30.0–36.0)
MCV: 100 fL (ref 80.0–100.0)
Platelets: 102 K/uL — ABNORMAL LOW (ref 150–400)
RBC: 2.43 MIL/uL — ABNORMAL LOW (ref 3.87–5.11)
RDW: 18.2 % — ABNORMAL HIGH (ref 11.5–15.5)
WBC: 2.4 K/uL — ABNORMAL LOW (ref 4.0–10.5)
nRBC: 0 % (ref 0.0–0.2)

## 2024-10-30 LAB — BASIC METABOLIC PANEL WITH GFR
Anion gap: 7 (ref 5–15)
BUN: 11 mg/dL (ref 8–23)
CO2: 22 mmol/L (ref 22–32)
Calcium: 8.2 mg/dL — ABNORMAL LOW (ref 8.9–10.3)
Chloride: 108 mmol/L (ref 98–111)
Creatinine, Ser: 1.09 mg/dL — ABNORMAL HIGH (ref 0.44–1.00)
GFR, Estimated: 50 mL/min — ABNORMAL LOW (ref >60)
Glucose, Bld: 85 mg/dL (ref 70–99)
Potassium: 3.4 mmol/L — ABNORMAL LOW (ref 3.5–5.1)
Sodium: 137 mmol/L (ref 135–145)

## 2024-10-30 LAB — GLUCOSE, CAPILLARY
Glucose-Capillary: 94 mg/dL (ref 70–99)
Glucose-Capillary: 121 mg/dL — ABNORMAL HIGH (ref 70–99)

## 2024-10-30 MED ORDER — AMOXICILLIN 500 MG PO CAPS: 500.0000 mg | ORAL_CAPSULE | Freq: Three times a day (TID) | ORAL | 0 refills | Status: AC

## 2024-10-30 MED ORDER — AMOXICILLIN 250 MG PO CAPS
500.0000 mg | ORAL_CAPSULE | Freq: Three times a day (TID) | ORAL | Status: DC
Start: 2024-10-30 — End: 2024-10-30
  Administered 2024-10-30: 500 mg via ORAL
  Filled 2024-10-30: qty 2

## 2024-10-30 NOTE — Progress Notes (Signed)
 Transfer report called to [facility] and given to receiving nurse. AVS in discharge packet for receiving facility.

## 2024-10-30 NOTE — Progress Notes (Addendum)
 Physical Therapy Treatment Patient Details Name: Brittany Archer MRN: [id number] DOB: 08-01-1941 Today's Date: 10/30/2024   History of Present Illness Brittany Archer is a 83 y.o. female with medical history significant of CAD, type 2 diabetes mellitus, multiple myeloma, bilateral breast cancer, CKD stage IIIb, GERD, L2 compression fracture who presents to the emergency department from home via EMS for evaluation of weakness.  At bedside, patient states that she does not know why she was brought to the ED probably due to her history of dementia, but she complained of generalized weakness.  History was obtained from ED physician and ED medical record.  Per report, patient lives with sister who reports that patient has not been eating or drinking since last 2 to 3 days and now has increasingly become weaker.  Patient usually get up from the bed sometimes at baseline, but has not been doing so since last 2 to 3 days.  Sister activated EMS, so the patient can be evaluated in the ED.    PT Comments  Pt. Presented with general weakness, with treatment today. Pt was able to perform bed mobility with CGA and was slow and labored to EOB, Pt used RW for transfer and ambulation with CGA. Pt was able to ambulate to bathroom, and to the hallway. Pt felt fatigued towards the end of ambulation in the LE. Pt was left in chair with call bell. Nursing staff was notified on pt. Status. Patient will benefit from continued skilled physical therapy in hospital and recommended venue below to continue to increase strength, balance, endurance for safe ADLs and gait.    If plan is discharge home, recommend the following: A little help with walking and/or transfers;Assistance with cooking/housework;Assist for transportation;A little help with bathing/dressing/bathroom   Can travel by private vehicle     Yes  Equipment Recommendations  None recommended by PT    Recommendations for Other Services       Precautions /  Restrictions Precautions Precautions: Fall Recall of Precautions/Restrictions: Intact Restrictions Weight Bearing Restrictions Per Provider Order: No     Mobility  Bed Mobility Overal bed mobility: Needs Assistance Bed Mobility: Supine to Sit     Supine to sit: Supervision, Contact guard, HOB elevated     General bed mobility comments: slow and labored    Transfers Overall transfer level: Needs assistance Equipment used: Rolling walker (2 wheels) Transfers: Sit to/from Stand, Bed to chair/wheelchair/BSC Sit to Stand: Contact guard assist   Step pivot transfers: Contact guard assist       General transfer comment: Pt used RW for transfer form sit to stand, slow and labored    Ambulation/Gait Ambulation/Gait assistance: Contact guard assist Gait Distance (Feet): 47 Feet Assistive device: Rolling walker (2 wheels) Gait Pattern/deviations: Decreased step length - right, Decreased step length - left, Decreased stance time - right, Decreased stance time - left, Step-through pattern, Decreased stride length, Narrow base of support Gait velocity: slow     General Gait Details: Pt. was able to ambulate to the bathroom, and to the hallways but felt fatigued and weak in the LE towards the end of ambulation   Stairs             Wheelchair Mobility     Tilt Bed    Modified Rankin (Stroke Patients Only)       Balance Overall balance assessment: Needs assistance, Modified Independent Sitting-balance support: Feet supported, No upper extremity supported Sitting balance-Leahy Scale: Good Sitting balance - Comments: Good/Fair EOB  Standing balance support: During functional activity Standing balance-Leahy Scale: Poor Standing balance comment: Fair/ Poor W/ RW                            Communication Communication Communication: No apparent difficulties Factors Affecting Communication: Hearing impaired  Cognition Arousal: Alert Behavior During  Therapy: WFL for tasks assessed/performed   PT - Cognitive impairments: No apparent impairments                         Following commands: Intact      Cueing Cueing Techniques: Verbal cues, Tactile cues  Exercises General Exercises - Lower Extremity Ankle Circles/Pumps: Seated, AROM, Both, 10 reps, Limitations Ankle Circles/Pumps Limitations: LLE limited ROM during warm up Straight Leg Raises: Seated, AROM, Strengthening, Both, 10 reps Hip Flexion/Marching: Seated, AROM, Strengthening, Both, 10 reps Toe Raises: Seated, AROM, Strengthening, Both, 10 reps Heel Raises: Seated, AROM, Strengthening, Both, 10 reps    General Comments        Pertinent Vitals/Pain Pain Assessment Pain Assessment: No/denies pain    Home Living                          Prior Function            PT Goals (current goals can now be found in the care plan section) Acute Rehab PT Goals Patient Stated Goal: Pt. wants to return home to family PT Goal Formulation: With patient Time For Goal Achievement: 11/03/24 Potential to Achieve Goals: Good Progress towards PT goals: Progressing toward goals    Frequency    Min 3X/week      PT Plan      Co-evaluation              AM-PAC PT 6 Clicks Mobility   Outcome Measure  Help needed turning from your back to your side while in a flat bed without using bedrails?: A Little Help needed moving from lying on your back to sitting on the side of a flat bed without using bedrails?: A Little Help needed moving to and from a bed to a chair (including a wheelchair)?: A Little Help needed standing up from a chair using your arms (e.g., wheelchair or bedside chair)?: A Little Help needed to walk in hospital room?: A Little Help needed climbing 3-5 steps with a railing? : A Lot 6 Click Score: 17    End of Session   Activity Tolerance: Patient tolerated treatment well Patient left: in chair;with call bell/phone within  reach Nurse Communication: Mobility status PT Visit Diagnosis: Other abnormalities of gait and mobility (R26.89);History of falling (Z91.81);Muscle weakness (generalized) (M62.81)     Time: 8951-8891 PT Time Calculation (min) (ACUTE ONLY): 20 min  Charges:    $Gait Training: 8-22 mins $Therapeutic Exercise: 8-22 mins PT General Charges $$ ACUTE PT VISIT: 1 Visit                        Ivery Cable, SPT    This licensed practitioner was present in the room guiding the student in service delivery.Therapy student was participating in the provision of services, and the practitioner was not engaged in treating another patient or doing other tasks at the same time.   12:22 PM, 10/30/24 Lynwood Music, MPT Physical Therapist with [facility] [phone] office [phone] mobile phone

## 2024-10-30 NOTE — Plan of Care (Signed)

## 2024-10-30 NOTE — TOC Transition Note (Signed)
 Transition of Care [facility]) - Discharge Note   Patient Details  Name: Brittany Archer MRN: [id number] Date of Birth: [birth year]  [facility] CM/SW Contact:  Lucie Lunger, LCSWA Phone Number: 10/30/2024, 3:39 PM  Clinical Narrative:    CSW updated that pts insurance auth has been approved for SNF at [facility]. CSW spoke to [facility] with facility who states they can accept pt today. D/C clinicals sent to facility via HUB. RN provided with room and report numbers. Pts sister notified of plan for D/C to SNF today. [facility] called for transport. TOC signing off.   Final next level of care: Skilled Nursing Facility Barriers to Discharge: Barriers Resolved   Patient Goals and CMS Choice Patient states their goals for this hospitalization and ongoing recovery are:: go to SNF CMS Medicare.gov Compare Post Acute Care list provided to:: Patient Represenative (must comment) Choice offered to / list presented to : [facility] ownership interest in [facility].provided to::  (n/a)    Discharge Placement                Patient to be transferred to facility by: [facility] Name of family member notified: sister Patient and family notified of of transfer: 10/30/24  Discharge Plan and Services Additional resources added to the After Visit Summary for   In-house Referral: Clinical Social Work   Post Acute Care Choice: Home Health                    HH Arranged: PT, OT, RN [facility] Agency: [facility] Date [facility] Agency Contacted: 10/29/24 Time HH Agency Contacted: 1014 Representative spoke with at [facility] Agency: [facility]le[facility]  Social Drivers of Health (SDOH) Interventions SDOH Screenings   Food Insecurity: No Food Insecurity (10/26/2024)  Housing: Low Risk  (10/26/2024)  Transportation Needs: No Transportation Needs (10/26/2024)  Utilities: Not At Risk (10/26/2024)  Alcohol Screen: Low Risk  (12/09/2020)  Depression (PHQ2-9): Low Risk  (10/19/2024)   Financial Resource Strain: Low Risk  (12/09/2020)  Physical Activity: Inactive (12/09/2020)  Social Connections: Moderately Isolated (10/26/2024)  Stress: No Stress Concern Present (12/09/2020)  Tobacco Use: Low Risk  (10/25/2024)     Readmission Risk Interventions    10/29/2024   10:13 AM 10/27/2024   10:39 AM 10/26/2024   12:13 PM  Readmission Risk Prevention Plan  Transportation Screening Complete Complete Complete  HRI or Home Care Consult Complete Complete Complete  Social Work Consult for Recovery Care Planning/Counseling Complete Complete Complete  Palliative Care Screening Not Applicable Not Applicable Not Applicable  Medication Review Oceanographer) Complete Complete Complete

## 2024-10-30 NOTE — Discharge Summary (Signed)
 Physician Discharge Summary   Patient: Brittany Archer MRN: [id number] DOB: 07-Nov-1941  Admit date:     10/25/2024  Discharge date: 10/30/24  Discharge Physician: Adriana DELENA Grams   PCP: Katrinka Aquas, MD   Recommendations at discharge:   Follow to PCP in 1 week CBC CMP in 1 week, please continue monitoring acute on chronic anemia with history of multiple myeloma Follow-up with outpatient palliative care team Continue current medication including recommend antibiotics, Encouraging oral hydration  Discharge Diagnoses: Principal Problem:   Acute cystitis Active Problems:   Protein-calorie malnutrition, severe   Brittany Archer is a 83 y.o. female with medical history significant of CAD, type 2 diabetes mellitus, multiple myeloma, bilateral breast cancer, CKD stage IIIb, GERD, L2 compression fracture who presents to the emergency department from home via EMS for evaluation of weakness.  Patient was admitted for generalized weakness in the setting of UTI and was started on IV Rocephin  empirically.  She is also thought to have some failure to thrive    Assessment and Plan:  Presumed UTI POA Continue IV antibiotics of Rocephin  >>> switching to p.o. amoxicillin  Urine culture > 100 K colonies E. coli, pansensitive Afebrile chronic leukopenic, normotensive   Generalized weakness Failure to thrive in adults Protein supplement will be provided Dietitian will be consulted, we shall await further recommendation Fall precautions Consult PT OT, arranging home health Pancytopenia This may be due to chemotherapy Continue to monitor CBC    Latest Ref Rng & Units 10/30/2024    3:47 AM 10/29/2024    4:23 AM 10/28/2024    3:27 AM  CBC  WBC 4.0 - 10.5 K/uL 2.4  2.4  2.4   Hemoglobin 12.0 - 15.0 g/dL 7.8  7.9  7.5   Hematocrit 36.0 - 46.0 % 24.3  24.5  23.9   Platelets 150 - 400 K/uL 102  102  87       Macrocytic anemia MCV 102.3, vitamin B12 normal at 536, folate normal at  6.6 Iron/TIBC/Ferritin/ %Sat    Component Value Date/Time   IRON 39 10/28/2024 0733   TIBC NOT CALCULATED 10/28/2024 0733   FERRITIN 1,021 (H) 10/19/2024 0817   IRONPCTSAT NOT CALCULATED 10/28/2024 0733         Chronic back pain/history of multiple myeloma Continue TLSO brace Continue PT and OT eval and treat   Type 2 diabetes mellitus Hemoglobin A1c on 05/29/23 was 7.4 Continue home regimen, diabetic diet   CAD Mixed hyperlipidemia Continue aspirin  and atorvastatin    Advanced dementia Continue home memantine    GERD Continue pantoprazole , Pepcid    Left breast cancer Multiple Myeloma Continue home Arimidex  Continue Velcade  with [facility]        ----------------------------------------------------------------------------------------------------------------------------------------------- Nutritional status:  The patient's BMI is: Body mass index is 21.19 kg/m. I agree with the assessment and plan as outlined  Encouraging oral hydration, nutritional supplements  ---------------------------------------------------------------------------------------------------------------------------------------------------- Cultures; Blood Cultures x 2 >> no growth to date Urine Culture  >>> > 100K E. coli-pansensitive     Consultants: PT/OT/TOC Procedures performed: None Disposition: Home Diet recommendation:  Discharge Diet Orders (From admission, onward)     Start     Ordered   10/29/24 0000  Diet - low sodium heart healthy        10/29/24 0756           Cardiac and Carb modified diet DISCHARGE MEDICATION: Allergies as of 10/30/2024       Reactions   Motrin [ibuprofen] Rash   Seasonal  Ic [cholestatin] Other (See Comments)   Sneezing, watery eyes        Medication List     TAKE these medications    Accu-Chek Aviva Plus test strip Generic drug: glucose blood CHECK BLOOD SUGAR ONCE DAILY   acetaminophen  325 MG tablet Commonly known as:  TYLENOL  Take 2 tablets (650 mg total) by mouth in the morning, at noon, and at bedtime. What changed:  when to take this reasons to take this   amoxicillin  500 MG capsule Commonly known as: AMOXIL  Take 1 capsule (500 mg total) by mouth every 8 (eight) hours for 5 days.   anastrozole  1 MG tablet Commonly known as: ARIMIDEX  Take 1 tablet (1 mg total) by mouth daily.   Aspirin  Low Dose 81 MG tablet Generic drug: aspirin  EC Take 81 mg by mouth daily.   atorvastatin  10 MG tablet Commonly known as: LIPITOR Take 10 mg by mouth daily.   bortezomib  IV 3.5 MG injection Commonly known as: VELCADE  Inject 3.5 mg into the vein every Thursday.   lenalidomide  10 MG capsule Commonly known as: REVLIMID  Take 1 capsule (10 mg total) by mouth daily. Take daily for 21 days, with 7 days off   magnesium  oxide 400 (240 Mg) MG tablet Commonly known as: MAG-OX Take 1 tablet (400 mg total) by mouth 3 (three) times daily.   megestrol 400 MG/10ML suspension Commonly known as: MEGACE Take 10 mLs (400 mg total) by mouth daily.   memantine  5 MG tablet Commonly known as: NAMENDA  Take 1 tablet (5 mg total) by mouth daily.   metFORMIN  500 MG tablet Commonly known as: GLUCOPHAGE  Take 500 mg by mouth daily.   pantoprazole  40 MG tablet Commonly known as: PROTONIX  Take 1 tablet (40 mg total) by mouth daily.   potassium chloride  20 MEQ packet Commonly known as: KLOR-CON  Take 20 mEq by mouth 2 (two) times daily.        Contact information for follow-up providers     [facility], [facility] Follow up.   Specialty: Home Health Services Why: Agency will call to set up first home therapy visit. Contact information: [address] [place] [address] [phone]              Contact information for after-discharge care     Destination     [facility]   Service: Skilled Nursing Contact information: [address] [place]   [address] [phone]                    Discharge Exam: Brittany Archer   10/25/24 1605 10/26/24 1500  Weight: 58.1 kg 56 kg         General:  AAO x 1,  cooperative, no distress;   HEENT:  Normocephalic, PERRL, otherwise with in Normal limits   Neuro:  CNII-XII intact. , normal motor and sensation, reflexes intact   Lungs:   Clear to auscultation BL, Respirations unlabored,  No wheezes / crackles  Cardio:    S1/S2, RRR, No murmure, No Rubs or Gallops   Abdomen:  Soft, non-tender, bowel sounds active all four quadrants, no guarding or peritoneal signs.  Muscular  skeletal:  Limited exam -global generalized weaknesses - in bed, able to move all 4 extremities,   2+ pulses,  symmetric, No pitting edema  Skin:  Dry, warm to touch, negative for any Rashes,  Wounds: Please see nursing documentation             Condition  at discharge: fair  The results of significant diagnostics from this hospitalization (including imaging, microbiology, ancillary and laboratory) are listed below for reference.   Imaging Studies: CT ABDOMEN PELVIS W CONTRAST Result Date: 10/25/2024 EXAM: CT ABDOMEN AND PELVIS WITH CONTRAST 10/25/2024 07:48:21 PM TECHNIQUE: CT of the abdomen and pelvis was performed with the administration of 100 mL of iohexol  (OMNIPAQUE ) 300 MG/ML solution. Multiplanar reformatted images are provided for review. Automated exposure control, iterative reconstruction, and/or weight-based adjustment of the mA/kV was utilized to reduce the radiation dose to as low as reasonably achievable. COMPARISON: CT abdomen and pelvis 8:15. CLINICAL HISTORY: Abdominal pain, acute, nonlocalized. Pt BIB [facility] for failure to thrive. Pt generally weak, not eating or drinking. VS WNL. FINDINGS: LOWER CHEST: No acute abnormality. LIVER: The liver is unremarkable. GALLBLADDER AND BILE DUCTS: Gallbladder is unremarkable. No biliary ductal dilatation. SPLEEN: No acute abnormality. PANCREAS: No acute  abnormality. ADRENAL GLANDS: No acute abnormality. KIDNEYS, URETERS AND BLADDER: There is a cyst in the left kidney measuring 4.1 cm. Per consensus, no follow-up is needed for simple Bosniak type 1 and 2 renal cysts, unless the patient has a malignancy history or risk factors. No stones in the kidneys or ureters. No hydronephrosis. No perinephric or periureteral stranding. There is a small focus of air in the bladder. GI AND BOWEL: Stomach demonstrates no acute abnormality. There is no bowel obstruction. The appendix is not seen. There is sigmoid and descending colon diverticulosis. There is some mild wall thickening of the transverse colon without surrounding inflammation. There are some scattered air-fluid levels within the proximal small bowel loops. There is some wall thickening of the anorectal junction. PERITONEUM AND RETROPERITONEUM: No ascites. No free air. VASCULATURE: Aorta is normal in caliber. There are atherosclerotic calcifications of the aorta. LYMPH NODES: No lymphadenopathy. REPRODUCTIVE ORGANS: No acute abnormality. BONES AND SOFT TISSUES: The bones are diffusely osteopenic. Moderate compression deformity of L5 is unchanged. Prominent Schmorl's nodes in L2 and L3 appear unchanged. There is mild compression deformity of the superior endplate of L1 which has slightly progressed. No focal soft tissue abnormality. IMPRESSION: 1. Mild wall thickening of the transverse colon without surrounding inflammation. Correlate clinically for nonspecific colitis. 2. Wall thickening of the anorectal junction. Recommend correlation with physical examination. 3. Sigmoid and descending colon diverticulosis without evidence of diverticulitis. Electronically signed by: Greig Pique MD 10/25/2024 08:12 PM EDT RP Workstation: [id number]   CT Head Wo Contrast Result Date: 10/25/2024 CLINICAL DATA:  Mental status change, unknown cause EXAM: CT HEAD WITHOUT CONTRAST TECHNIQUE: Contiguous axial images were obtained from  the base of the skull through the vertex without intravenous contrast. RADIATION DOSE REDUCTION: This exam was performed according to the departmental dose-optimization program which includes automated exposure control, adjustment of the mA and/or kV according to patient size and/or use of iterative reconstruction technique. COMPARISON:  Head CT 06/16/2023 FINDINGS: Brain: No intracranial hemorrhage, mass effect, or midline shift. Generalized atrophy. No hydrocephalus. The basilar cisterns are patent. Mild periventricular deep white matter hypodensity consistent with chronic small vessel ischemia. No evidence of territorial infarct or acute ischemia. No extra-axial or intracranial fluid collection. Vascular: Atherosclerosis of skullbase vasculature without hyperdense vessel or abnormal calcification. Skull: No fracture or focal lesion. Sinuses/Orbits: No acute finding. Chronic defect of the right lamina preparation. Other: None. IMPRESSION: 1. No acute intracranial abnormality. 2. Generalized atrophy and chronic small vessel ischemia. Electronically Signed   By: Andrea Gasman M.D.   On: 10/25/2024 19:54   DG Chest Portable 1  View Result Date: 10/25/2024 CLINICAL DATA:  Shortness of breath and failure to thrive. Multiple myeloma. EXAM: PORTABLE CHEST 1 VIEW COMPARISON:  03/14/2022 FINDINGS: Patient is rotated to the right. Metallic device overlies the lateral right lower lung. Several metallic objects project over the right thorax including a Bobby pin. Lungs are hypoinflated without acute airspace consolidation or effusion. Cardiomediastinal silhouette and remainder of the exam is unchanged. IMPRESSION: Hypoinflation without acute cardiopulmonary disease. Electronically Signed   By: Toribio Agreste M.D.   On: 10/25/2024 18:42    Microbiology: Results for orders placed or performed during the hospital encounter of 10/25/24  Resp panel by RT-PCR (RSV, Flu A&B, Covid) Anterior Nasal Swab     Status: None    Collection Time: 10/25/24  6:37 PM   Specimen: Anterior Nasal Swab  Result Value Ref Range Status   SARS Coronavirus 2 by RT PCR NEGATIVE NEGATIVE Final    Comment: (NOTE) SARS-CoV-2 target nucleic acids are NOT DETECTED.  The SARS-CoV-2 RNA is generally detectable in upper respiratory specimens during the acute phase of infection. The lowest concentration of SARS-CoV-2 viral copies this assay can detect is 138 copies/mL. A negative result does not preclude SARS-Cov-2 infection and should not be used as the sole basis for treatment or other patient management decisions. A negative result may occur with  improper specimen collection/handling, submission of specimen other than nasopharyngeal swab, presence of viral mutation(s) within the areas targeted by this assay, and inadequate number of viral copies(<138 copies/mL). A negative result must be combined with clinical observations, patient history, and epidemiological information. The expected result is Negative.  Fact Sheet for Patients:  bloggercourse.com  Fact Sheet for Healthcare Providers:  seriousbroker.it  This test is no t yet approved or cleared by the [place]  FDA and  has been authorized for detection and/or diagnosis of SARS-CoV-2 by FDA under an Emergency Use Authorization (EUA). This EUA will remain  in effect (meaning this test can be used) for the duration of the COVID-19 declaration under Section 564(b)(1) of the Act, 21 U.S.C.section 360bbb-3(b)(1), unless the authorization is terminated  or revoked sooner.       Influenza A by PCR NEGATIVE NEGATIVE Final   Influenza B by PCR NEGATIVE NEGATIVE Final    Comment: (NOTE) The Xpert Xpress SARS-CoV-2/FLU/RSV plus assay is intended as an aid in the diagnosis of influenza from Nasopharyngeal swab specimens and should not be used as a sole basis for treatment. Nasal washings and aspirates are unacceptable for  Xpert Xpress SARS-CoV-2/FLU/RSV testing.  Fact Sheet for Patients: bloggercourse.com  Fact Sheet for Healthcare Providers: seriousbroker.it  This test is not yet approved or cleared by the [place]  FDA and has been authorized for detection and/or diagnosis of SARS-CoV-2 by FDA under an Emergency Use Authorization (EUA). This EUA will remain in effect (meaning this test can be used) for the duration of the COVID-19 declaration under Section 564(b)(1) of the Act, 21 U.S.C. section 360bbb-3(b)(1), unless the authorization is terminated or revoked.     Resp Syncytial Virus by PCR NEGATIVE NEGATIVE Final    Comment: (NOTE) Fact Sheet for Patients: bloggercourse.com  Fact Sheet for Healthcare Providers: seriousbroker.it  This test is not yet approved or cleared by the [place]  FDA and has been authorized for detection and/or diagnosis of SARS-CoV-2 by FDA under an Emergency Use Authorization (EUA). This EUA will remain in effect (meaning this test can be used) for the duration of the COVID-19 declaration under Section 564(b)(1) of the Act,  21 U.S.C. section 360bbb-3(b)(1), unless the authorization is terminated or revoked.  Performed at [facility], [address] [place], [place] [address]   Urine Culture     Status: Abnormal   Collection Time: 10/25/24  8:52 PM   Specimen: Urine, Random  Result Value Ref Range Status   Specimen Description   Final    URINE, RANDOM Performed at [facility], [address], [place], [place] [address]    Special Requests   Final    NONE Reflexed from [id number] Performed at [facility], [address], [place], [place] [address]    Culture (A)  Final    >=100,000 COLONIES/mL ESCHERICHIA COLI >=100,000 COLONIES/mL AEROCOCCUS URINAE Standardized susceptibility testing for this organism is not available. Performed at [facility], [address] [place], [place] [address]    Report Status 10/28/2024 FINAL  Final   Organism ID, Bacteria ESCHERICHIA COLI (A)  Final      Susceptibility   Escherichia coli - MIC*    AMPICILLIN  <=2 SENSITIVE Sensitive     CEFAZOLIN (URINE) Value in next row Sensitive      <=1 SENSITIVEThis is a modified FDA-approved test that has been validated and its performance characteristics determined by the reporting laboratory.  This laboratory is certified under the Clinical Laboratory Improvement Amendments CLIA as qualified to perform high complexity clinical laboratory testing.    CEFEPIME Value in next row Sensitive      <=1 SENSITIVEThis is a modified FDA-approved test that has been validated and its performance characteristics determined by the reporting laboratory.  This laboratory is certified under the Clinical Laboratory Improvement Amendments CLIA as qualified to perform high complexity clinical laboratory testing.    ERTAPENEM Value in next row Sensitive      <=1 SENSITIVEThis is a modified FDA-approved test that has been validated and its performance characteristics determined by the reporting laboratory.  This laboratory is certified under the Clinical Laboratory Improvement Amendments CLIA as qualified to perform high complexity clinical laboratory testing.    CEFTRIAXONE  Value in next row Sensitive      <=1 SENSITIVEThis is a modified FDA-approved test that has been validated and its performance characteristics determined by the reporting laboratory.  This laboratory is certified under the Clinical Laboratory Improvement Amendments CLIA as qualified to perform high complexity clinical laboratory testing.    CIPROFLOXACIN Value in next row Sensitive      <=1 SENSITIVEThis is a modified FDA-approved test that has been validated and its performance characteristics determined by the reporting laboratory.  This laboratory is certified under the Clinical Laboratory Improvement Amendments CLIA as  qualified to perform high complexity clinical laboratory testing.    GENTAMICIN Value in next row Sensitive      <=1 SENSITIVEThis is a modified FDA-approved test that has been validated and its performance characteristics determined by the reporting laboratory.  This laboratory is certified under the Clinical Laboratory Improvement Amendments CLIA as qualified to perform high complexity clinical laboratory testing.    NITROFURANTOIN Value in next row Sensitive      <=1 SENSITIVEThis is a modified FDA-approved test that has been validated and its performance characteristics determined by the reporting laboratory.  This laboratory is certified under the Clinical Laboratory Improvement Amendments CLIA as qualified to perform high complexity clinical laboratory testing.    TRIMETH /SULFA  Value in next row Sensitive      <=1 SENSITIVEThis is a modified FDA-approved test that has been validated and its performance characteristics determined by the  reporting laboratory.  This laboratory is certified under the Clinical Laboratory Improvement Amendments CLIA as qualified to perform high complexity clinical laboratory testing.    AMPICILLIN /SULBACTAM Value in next row Sensitive      <=1 SENSITIVEThis is a modified FDA-approved test that has been validated and its performance characteristics determined by the reporting laboratory.  This laboratory is certified under the Clinical Laboratory Improvement Amendments CLIA as qualified to perform high complexity clinical laboratory testing.    PIP/TAZO Value in next row Sensitive      <=4 SENSITIVEThis is a modified FDA-approved test that has been validated and its performance characteristics determined by the reporting laboratory.  This laboratory is certified under the Clinical Laboratory Improvement Amendments CLIA as qualified to perform high complexity clinical laboratory testing.    MEROPENEM Value in next row Sensitive      <=4 SENSITIVEThis is a modified  FDA-approved test that has been validated and its performance characteristics determined by the reporting laboratory.  This laboratory is certified under the Clinical Laboratory Improvement Amendments CLIA as qualified to perform high complexity clinical laboratory testing.    * >=100,000 COLONIES/mL ESCHERICHIA COLI   *Note: Due to a large number of results and/or encounters for the requested time period, some results have not been displayed. A complete set of results can be found in Results Review.    Labs: CBC: Recent Labs  Lab 10/25/24 1658 10/26/24 0327 10/27/24 0404 10/28/24 0327 10/29/24 0423 10/30/24 0347  WBC 2.9* 2.7* 2.5* 2.4* 2.4* 2.4*  NEUTROABS 1.8  --   --   --   --   --   HGB 9.7* 8.8* 8.4* 7.5* 7.9* 7.8*  HCT 30.8* 28.6* 26.4* 23.9* 24.5* 24.3*  MCV 102.3* 102.5* 100.0 101.3* 98.8 100.0  PLT 89* 85* 87* 87* 102* 102*   Basic Metabolic Panel: Recent Labs  Lab 10/26/24 0327 10/27/24 0404 10/28/24 0327 10/28/24 0900 10/29/24 0423 10/30/24 0347  NA 139 139 138  --  139 137  K 3.7 3.2* 3.1*  --  3.4* 3.4*  CL 106 106 106  --  108 108  CO2 22 25 24   --  22 22  GLUCOSE 57* 92 97  --  82 85  BUN 15 15 9   --  9 11  CREATININE 0.91 1.10* 0.90  --  0.91 1.09*  CALCIUM  8.2* 8.4* 7.9*  --  8.1* 8.2*  MG 1.9 1.9  --  2.0  --   --   PHOS 2.8  --   --   --   --   --    Liver Function Tests: Recent Labs  Lab 10/25/24 1658 10/26/24 0327  AST 16 15  ALT 9 10  ALKPHOS 115 108  BILITOT 1.0 0.6  PROT 5.2* 4.9*  ALBUMIN 3.1* 2.9*   CBG: Recent Labs  Lab 10/29/24 1105 10/29/24 1608 10/29/24 2031 10/30/24 0723 10/30/24 1124  GLUCAP 80 98 81 94 121*    Discharge time spent: greater than 40 minutes.  Signed: Adriana DELENA Grams, MD [facility] 10/30/2024

## 2024-10-30 NOTE — Plan of Care (Signed)
 Problem: Education: Goal: Ability to describe self-care measures that may prevent or decrease complications (Diabetes Survival Skills Education) will improve Outcome: Adequate for Discharge Goal: Individualized Educational Video(s) Outcome: Adequate for Discharge   Problem: Coping: Goal: Ability to adjust to condition or change in health will improve 10/30/2024 1019 by Johnie Will HERO, RN Outcome: Adequate for Discharge 10/30/2024 1018 by Johnie Will HERO, RN Outcome: Progressing   Problem: Fluid Volume: Goal: Ability to maintain a balanced intake and output will improve 10/30/2024 1019 by Johnie Will HERO, RN Outcome: Adequate for Discharge 10/30/2024 1018 by Johnie Will HERO, RN Outcome: Progressing   Problem: Health Behavior/Discharge Planning: Goal: Ability to identify and utilize available resources and services will improve 10/30/2024 1019 by Johnie Will HERO, RN Outcome: Adequate for Discharge 10/30/2024 1018 by Johnie Will HERO, RN Outcome: Progressing Goal: Ability to manage health-related needs will improve 10/30/2024 1019 by Johnie Will HERO, RN Outcome: Adequate for Discharge 10/30/2024 1018 by Johnie Will HERO, RN Outcome: Progressing   Problem: Metabolic: Goal: Ability to maintain appropriate glucose levels will improve 10/30/2024 1019 by Johnie Will HERO, RN Outcome: Adequate for Discharge 10/30/2024 1018 by Johnie Will HERO, RN Outcome: Progressing   Problem: Nutritional: Goal: Maintenance of adequate nutrition will improve 10/30/2024 1019 by Johnie Will HERO, RN Outcome: Adequate for Discharge 10/30/2024 1018 by Johnie Will HERO, RN Outcome: Progressing Goal: Progress toward achieving an optimal weight will improve 10/30/2024 1019 by Johnie Will HERO, RN Outcome: Adequate for Discharge 10/30/2024 1018 by Johnie Will HERO, RN Outcome: Progressing   Problem: Skin Integrity: Goal: Risk for impaired skin integrity will  decrease 10/30/2024 1019 by Johnie Will HERO, RN Outcome: Adequate for Discharge 10/30/2024 1018 by Johnie Will HERO, RN Outcome: Progressing   Problem: Tissue Perfusion: Goal: Adequacy of tissue perfusion will improve 10/30/2024 1019 by Johnie Will HERO, RN Outcome: Adequate for Discharge 10/30/2024 1018 by Johnie Will HERO, RN Outcome: Progressing   Problem: Education: Goal: Knowledge of General Education information will improve Description: Including pain rating scale, medication(s)/side effects and non-pharmacologic comfort measures 10/30/2024 1019 by Johnie Will HERO, RN Outcome: Adequate for Discharge 10/30/2024 1018 by Johnie Will HERO, RN Outcome: Progressing   Problem: Health Behavior/Discharge Planning: Goal: Ability to manage health-related needs will improve 10/30/2024 1019 by Johnie Will HERO, RN Outcome: Adequate for Discharge 10/30/2024 1018 by Johnie Will HERO, RN Outcome: Progressing   Problem: Clinical Measurements: Goal: Ability to maintain clinical measurements within normal limits will improve 10/30/2024 1019 by Johnie Will HERO, RN Outcome: Adequate for Discharge 10/30/2024 1018 by Johnie Will HERO, RN Outcome: Progressing Goal: Will remain free from infection 10/30/2024 1019 by Johnie Will HERO, RN Outcome: Adequate for Discharge 10/30/2024 1018 by Johnie Will HERO, RN Outcome: Progressing Goal: Diagnostic test results will improve 10/30/2024 1019 by Johnie Will HERO, RN Outcome: Adequate for Discharge 10/30/2024 1018 by Johnie Will HERO, RN Outcome: Progressing Goal: Respiratory complications will improve 10/30/2024 1019 by Johnie Will HERO, RN Outcome: Adequate for Discharge 10/30/2024 1018 by Johnie Will HERO, RN Outcome: Progressing Goal: Cardiovascular complication will be avoided 10/30/2024 1019 by Johnie Will HERO, RN Outcome: Adequate for Discharge 10/30/2024 1018 by Johnie Will HERO, RN Outcome:  Progressing   Problem: Activity: Goal: Risk for activity intolerance will decrease 10/30/2024 1019 by Johnie Will HERO, RN Outcome: Adequate for Discharge 10/30/2024 1018 by Johnie Will HERO, RN Outcome: Progressing   Problem: Nutrition: Goal: Adequate nutrition will be maintained 10/30/2024 1019 by Johnie Will HERO, RN Outcome: Adequate for Discharge 10/30/2024 1018  by Johnie Will HERO, RN Outcome: Progressing   Problem: Coping: Goal: Level of anxiety will decrease 10/30/2024 1019 by Johnie Will HERO, RN Outcome: Adequate for Discharge 10/30/2024 1018 by Johnie Will HERO, RN Outcome: Progressing   Problem: Elimination: Goal: Will not experience complications related to bowel motility 10/30/2024 1019 by Johnie Will HERO, RN Outcome: Adequate for Discharge 10/30/2024 1018 by Johnie Will HERO, RN Outcome: Progressing Goal: Will not experience complications related to urinary retention 10/30/2024 1019 by Johnie Will HERO, RN Outcome: Adequate for Discharge 10/30/2024 1018 by Johnie Will HERO, RN Outcome: Progressing   Problem: Pain Managment: Goal: General experience of comfort will improve and/or be controlled 10/30/2024 1019 by Johnie Will HERO, RN Outcome: Adequate for Discharge 10/30/2024 1018 by Johnie Will HERO, RN Outcome: Progressing   Problem: Safety: Goal: Ability to remain free from injury will improve 10/30/2024 1019 by Johnie Will HERO, RN Outcome: Adequate for Discharge 10/30/2024 1018 by Johnie Will HERO, RN Outcome: Progressing   Problem: Skin Integrity: Goal: Risk for impaired skin integrity will decrease 10/30/2024 1019 by Johnie Will HERO, RN Outcome: Adequate for Discharge 10/30/2024 1018 by Johnie Will HERO, RN Outcome: Progressing   Problem: Acute Rehab PT Goals(only PT should resolve) Goal: Pt Will Go Sit To Supine/Side Outcome: Adequate for Discharge Goal: Patient Will Perform Sitting Balance Outcome:  Adequate for Discharge Goal: Patient Will Transfer Sit To/From Stand Outcome: Adequate for Discharge Goal: Pt Will Transfer Bed To Chair/Chair To Bed Outcome: Adequate for Discharge Goal: Pt Will Perform Standing Balance Or Pre-Gait Outcome: Adequate for Discharge Goal: Pt Will Ambulate Outcome: Adequate for Discharge   Problem: Acute Rehab OT Goals (only OT should resolve) Goal: Pt. Will Perform Grooming Outcome: Adequate for Discharge Goal: Pt. Will Perform Lower Body Bathing Outcome: Adequate for Discharge Goal: Pt. Will Perform Lower Body Dressing Outcome: Adequate for Discharge Goal: Pt. Will Transfer To Toilet Outcome: Adequate for Discharge Goal: Pt. Will Perform Toileting-Clothing Manipulation Outcome: Adequate for Discharge

## 2024-10-30 NOTE — Progress Notes (Signed)
 RE: Brittany Archer DOB: [birth year] Date: 10/30/2024  MUST ID: [id number]  To Whom It May Concern:   Please be advised that the above name patient will require a short-term nursing home stay- anticipated 30 days or less rehabilitation and strengthening. The plan is for return home.

## 2024-10-30 NOTE — Progress Notes (Addendum)
 PROGRESS NOTE    Patient: Brittany Archer                            PCP: Katrinka Aquas, MD                    DOB: 1941/11/14            DOA: 10/25/2024 FMW:[id number]             DOS: 10/30/2024, 1:23 PM   LOS: 0 days   Date of Service: The patient was seen and examined on 10/30/2024  Subjective:   The patient was seen and examined this morning, stable no acute distress Hemodynamically stable   Brief Narrative:   Brittany Archer is a 83 y.o. female with medical history significant of CAD, type 2 diabetes mellitus, multiple myeloma, bilateral breast cancer, CKD stage IIIb, GERD, L2 compression fracture who presents to the emergency department from home via EMS for evaluation of weakness.  Patient was admitted for generalized weakness in the setting of UTI and was started on IV Rocephin  empirically.  She is also thought to have some failure to thrive.     Assessment & Plan:   Principal Problem:   Acute cystitis Active Problems:   Protein-calorie malnutrition, severe   Presumed UTI POA Continue IV antibiotics of Rocephin  >>> switching to p.o. amoxicillin  Urine culture > 100 K colonies E. coli, pansensitive Hemodynamically stable, afebrile normotensive   Generalized weakness Failure to thrive in adults -aggressive PT OT, fall precautions Protein supplement will be provided Dietitian will be consulted, we shall await further recommendation Anticipating discharge to SNF    Pancytopenia -with history of multiple myeloma This may be due to chemotherapy Continue to monitor CBC    Latest Ref Rng & Units 10/30/2024    3:47 AM 10/29/2024    4:23 AM 10/28/2024    3:27 AM  CBC  WBC 4.0 - 10.5 K/uL 2.4  2.4  2.4   Hemoglobin 12.0 - 15.0 g/dL 7.8  7.9  7.5   Hematocrit 36.0 - 46.0 % 24.3  24.5  23.9   Platelets 150 - 400 K/uL 102  102  87         Macrocytic anemia MCV 102.3, vitamin B12 normal at 536, folate normal at 6.6 Iron/TIBC/Ferritin/ %Sat Labs (Brief)           Component Value Date/Time    IRON 39 10/28/2024 0733    TIBC NOT CALCULATED 10/28/2024 0733    FERRITIN 1,021 (H) 10/19/2024 0817    IRONPCTSAT NOT CALCULATED 10/28/2024 0733              Chronic back pain/history of multiple myeloma Continue TLSO brace Continue PT and OT eval and treat   Type 2 diabetes mellitus Hemoglobin A1c on 05/29/23 was 7.4 Continue home regimen, diabetic diet   CAD Mixed hyperlipidemia Continue aspirin  and atorvastatin    Advanced dementia - Continue home memantine    GERD - Continue pantoprazole , Pepcid    Left breast cancer Multiple Myeloma Continue home Arimidex  Continue Velcade  with [facility]         ----------------------------------------------------------------------------------------------------------------------------------------------- Nutritional status:  The patient's BMI is: Body mass index is 21.19 kg/m. I agree with the assessment and plan as outlined ---------------------------------------------------------------------------------------------------------------------------------------------------- Cultures; Blood Cultures x 2 >> Urine Culture  >>> > 100K gram-negative rods  ------------------------------------------------------------------------------------------------------------------------------------------------  DVT prophylaxis:  SCDs Start: 10/26/24 0202   Code Status:  Code Status: Full Code  Family Communication: No family member present at bedside-  -Advance care planning has been discussed.   Admission status:   Status is: Observation The patient remains OBS appropriate and will d/c before 2 midnights.   Disposition: From  - home             Planning for discharge in 1-2 days   Procedures:   No admission procedures for hospital encounter.   Antimicrobials:  Anti-infectives (From admission, onward)    Start     Dose/Rate Route Frequency Ordered Stop   10/30/24 1415  amoxicillin  (AMOXIL )  capsule 500 mg        500 mg Oral Every 8 hours 10/30/24 1322 11/01/24 0559   10/30/24 0000  ciprofloxacin (CIPRO) 500 MG tablet  Status:  Discontinued        500 mg Oral 2 times daily 10/29/24 0756 10/30/24    10/30/24 0000  amoxicillin  (AMOXIL ) 500 MG capsule        500 mg Oral Every 8 hours 10/30/24 1323 11/04/24 2359   10/29/24 1000  ciprofloxacin (CIPRO) tablet 500 mg        500 mg Oral 2 times daily 10/29/24 0749     10/26/24 1000  cefTRIAXone  (ROCEPHIN ) 1 g in sodium chloride  0.9 % 100 mL IVPB  Status:  Discontinued        1 g 200 mL/hr over 30 Minutes Intravenous Every 24 hours 10/26/24 0229 10/29/24 0749   10/25/24 2200  cefTRIAXone  (ROCEPHIN ) 2 g in sodium chloride  0.9 % 100 mL IVPB        2 g 200 mL/hr over 30 Minutes Intravenous  Once 10/25/24 2150 10/25/24 2243        Medication:   amoxicillin   500 mg Oral Q8H   anastrozole   1 mg Oral Daily   aspirin  EC  81 mg Oral Daily   atorvastatin   10 mg Oral Daily   ciprofloxacin  500 mg Oral BID   feeding supplement  1 Container Oral TID BM   insulin  aspart  0-9 Units Subcutaneous TID WC   lactobacillus  1 g Oral TID WC   megestrol  400 mg Oral Daily   memantine   5 mg Oral Daily   multivitamin with minerals  1 tablet Oral Daily   pantoprazole   40 mg Oral Daily    acetaminophen  **OR** acetaminophen , famotidine , liver oil-zinc  oxide, ondansetron  **OR** ondansetron  (ZOFRAN ) IV, oxyCODONE    Objective:   Vitals:   10/29/24 1255 10/29/24 1905 10/30/24 0541 10/30/24 1155  BP: 134/69 108/63 (!) 142/76 108/63  Pulse: 71 64 (!) 55 62  Resp:  18 16 17   Temp: 99.6 F (37.6 C) 100 F (37.8 C) 99.3 F (37.4 C) 98.8 F (37.1 C)  TempSrc: Oral Oral Oral Oral  SpO2: 97% 98% 97% 98%  Weight:      Height:        Intake/Output Summary (Last 24 hours) at 10/30/2024 1323 Last data filed at 10/30/2024 1000 Gross per 24 hour  Intake 360 ml  Output --  Net 360 ml   Filed Weights   10/25/24 1605 10/26/24 1500  Weight: 58.1 kg  56 kg     Physical examination:   General:  AAO x 2,  cooperative, no distress;   HEENT:  Normocephalic, PERRL, otherwise with in Normal limits   Neuro:  CNII-XII intact. , normal motor and sensation, reflexes intact   Lungs:   Clear to auscultation BL, Respirations unlabored,  No  wheezes / crackles  Cardio:    S1/S2, RRR, No murmure, No Rubs or Gallops   Abdomen:  Soft, non-tender, bowel sounds active all four quadrants, no guarding or peritoneal signs.  Muscular  skeletal:  Limited exam -global generalized weaknesses - in bed, able to move all 4 extremities,   2+ pulses,  symmetric, No pitting edema  Skin:  Dry, warm to touch, negative for any Rashes,  Wounds: Please see nursing documentation   -------------------------------------------------------------------------------------------------------------------------------    LABs:     Latest Ref Rng & Units 10/30/2024    3:47 AM 10/29/2024    4:23 AM 10/28/2024    3:27 AM  CBC  WBC 4.0 - 10.5 K/uL 2.4  2.4  2.4   Hemoglobin 12.0 - 15.0 g/dL 7.8  7.9  7.5   Hematocrit 36.0 - 46.0 % 24.3  24.5  23.9   Platelets 150 - 400 K/uL 102  102  87       Latest Ref Rng & Units 10/30/2024    3:47 AM 10/29/2024    4:23 AM 10/28/2024    3:27 AM  CMP  Glucose 70 - 99 mg/dL 85  82  97   BUN 8 - 23 mg/dL 11  9  9    Creatinine 0.44 - 1.00 mg/dL 8.90  9.08  9.09   Sodium 135 - 145 mmol/L 137  139  138   Potassium 3.5 - 5.1 mmol/L 3.4  3.4  3.1   Chloride 98 - 111 mmol/L 108  108  106   CO2 22 - 32 mmol/L 22  22  24    Calcium  8.9 - 10.3 mg/dL 8.2  8.1  7.9        Micro Results  Urine culture > 100 K colony E. coli/gram-positive cocci -pansensitive Respiratory panel (RSV, flu A and B, COVID-all negative   Radiology Reports No results found.  SIGNED: Adriana DELENA Grams, MD, FHM. FAAFP. Jolynn Pack - [facility] Time spent - 55 min.  In seeing, evaluating and examining the patient. Reviewing medical records, labs, drawn plan of  care. [facility],  Pager (please use [email or web address] to page/ text) Please use Epic Secure Chat for non-urgent communication (7AM-7PM)  If 7PM-7AM, please contact night-coverage [email or web address], 10/30/2024, 1:23 PM

## 2024-10-30 NOTE — Plan of Care (Signed)

## 2024-10-30 NOTE — Consult Note (Addendum)
 Consultation Note Date: 10/30/2024   Patient Name: Brittany Archer  DOB: 1941-07-08  MRN: [id number]  Age / Sex: 83 y.o., female  PCP: Katrinka Aquas, MD Referring Physician: Willette Adriana LABOR, MD  Reason for Consultation:  code status and GOC  HPI/Patient Profile: 83 y.o. female  with past medical history of dementia, multiple myeloma on Revlimid  and Velcade , bilateral breast cancer on anastrozole , osteonecrosis of the jaw, compression fractures, DM2, CAD, CKD IIIb,  admitted on 10/25/2024 with UTI. Palliative medicine consulted for code status and GOC.   Primary Decision Maker NEXT OF KIN - pt sister- Tonda Long  Discussion: Chart reviewed including labs, progress notes, imaging from this and previous encounters.  Patient is stable for discharge, awaiting possible SNF placement. She has been treated with IV antibiotics for UTI.  Labs reveal pancytopenia CBC    Component Value Date/Time   WBC 2.4 (L) 10/30/2024 0347   RBC 2.43 (L) 10/30/2024 0347   HGB 7.8 (L) 10/30/2024 0347   HCT 24.3 (L) 10/30/2024 0347   PLT 102 (L) 10/30/2024 0347   MCV 100.0 10/30/2024 0347   MCH 32.1 10/30/2024 0347   MCHC 32.1 10/30/2024 0347   RDW 18.2 (H) 10/30/2024 0347   LYMPHSABS 0.7 10/25/2024 1658   MONOABS 0.3 10/25/2024 1658   EOSABS 0.1 10/25/2024 1658   BASOSABS 0.0 10/25/2024 1658      Latest Ref Rng & Units 10/30/2024    3:47 AM 10/29/2024    4:23 AM 10/28/2024    3:27 AM  CMP  Glucose 70 - 99 mg/dL 85  82  97   BUN 8 - 23 mg/dL 11  9  9    Creatinine 0.44 - 1.00 mg/dL 8.90  9.08  9.09   Sodium 135 - 145 mmol/L 137  139  138   Potassium 3.5 - 5.1 mmol/L 3.4  3.4  3.1   Chloride 98 - 111 mmol/L 108  108  106   CO2 22 - 32 mmol/L 22  22  24    Calcium  8.9 - 10.3 mg/dL 8.2  8.1  7.9    CMP shows elevated Cr to 1.09 today.  Urine culture showed E. Coli and Aerococcus urinae.  CT head reviewed-  negative for acute findings.   On evaluation patient awake, alert, pleasant. Could not discuss her health issues with me. Told me that her sister makes her decisions for her.  I called her sister Mae. Mae deferred discussion to her other sister Geneva.  I called Geneva. Introduced Palliative medicine.  Prior to admission Yaeli was living with Geneva. Tonda has noted that sometimes Leeyah doesn't want to do things. She will decline to take showers. She will spend most of her day sitting. She sometimes doesn't want to eat. She is able to ambulate. We discussed the diagnosis of dementia and the trajectory of dementia with worsening overtime.  Tonda and Carmina have never discussed Advanced Care Planning. There are no Advance Directives in place for patient.  Given patient's diagnosis of dementia, I encouraged  Geneva to begin to think about what Iza's wishes would be when her health declines. Reviewed that questions around artificial feeding and artificial life support need to be thought about within the context of the meaning of quality of life for Akisha.    SUMMARY OF RECOMMENDATIONS -Continue current interventions- patient is stable for discharge -Encouraged Geneva to begin to think about Advance Care Planning issues  -Tonda is hopeful for patient to d/c to SNF -Patient has been referred to outpatient Palliative by attending    Code Status/Advance Care Planning:   Code Status: Full Code    Prognosis:   Unable to determine  Discharge Planning: Skilled Nursing Facility for rehab with Palliative care service follow-up  Primary Diagnoses: Present on Admission:  Acute cystitis   Review of Systems  All other systems reviewed and are negative.   Physical Exam Vitals and nursing note reviewed.  Constitutional:      General: She is not in acute distress. Cardiovascular:     Rate and Rhythm: Normal rate.  Pulmonary:     Effort: Pulmonary effort is normal.  Neurological:      Mental Status: She is alert. Mental status is at baseline.     Vital Signs: BP (!) 142/76 (BP Location: Right Arm)   Pulse (!) 55   Temp 99.3 F (37.4 C) (Oral)   Resp 16   Ht 5' 4 (1.626 m)   Wt 56 kg   SpO2 97%   BMI 21.19 kg/m  Pain Scale: 0-10   Pain Score: 0-No pain   SpO2: SpO2: 97 % O2 Device:SpO2: 97 % O2 Flow Rate: .   IO: Intake/output summary:  Intake/Output Summary (Last 24 hours) at 10/30/2024 1147 Last data filed at 10/30/2024 1000 Gross per 24 hour  Intake 840 ml  Output --  Net 840 ml    LBM: Last BM Date : 10/30/24 Baseline Weight: Weight: 58.1 kg Most recent weight: Weight: 56 kg       Thank you for this consult. Palliative medicine will continue to follow and assist as needed.  Time Total: 80 minutes Signed by: Cassondra Stain, AGNP-C Palliative Medicine  Time includes:   Preparing to see the patient (e.g., review of tests) Obtaining and/or reviewing separately obtained history Performing a medically necessary appropriate examination and/or evaluation Counseling and educating the patient/family/caregiver Ordering medications, tests, or procedures Referring and communicating with other health care professionals (when not reported separately) Documenting clinical information in the electronic or other health record Independently interpreting results (not reported separately) and communicating results to the patient/family/caregiver Care coordination (not reported separately) Clinical documentation   Please contact Palliative Medicine Team phone at [phone] for questions and concerns.  For individual provider: See Tracey

## 2024-11-02 ENCOUNTER — Encounter: Payer: Self-pay | Admitting: *Deleted

## 2024-11-02 ENCOUNTER — Telehealth: Payer: Self-pay | Admitting: *Deleted

## 2024-11-02 ENCOUNTER — Other Ambulatory Visit: Payer: Self-pay | Admitting: *Deleted

## 2024-11-02 ENCOUNTER — Inpatient Hospital Stay

## 2024-11-02 NOTE — Telephone Encounter (Signed)
 Received call from [facility] from [facility] to advise that patient is currently unable to swallow solids, to include medications.  Anastrozole  and Revlimid  will have to be held until she is cleared to consume solids and swallow pills, as these cannot be crushed.  Will make us  aware when she is cleared to resume regular intake.  Speech therapy scheduled to work with her starting tomorrow.  Dr. Davonna made aware and medication list updated.

## 2024-11-09 ENCOUNTER — Other Ambulatory Visit: Payer: Self-pay | Admitting: Oncology

## 2024-11-09 ENCOUNTER — Inpatient Hospital Stay: Attending: Hematology

## 2024-11-09 ENCOUNTER — Inpatient Hospital Stay

## 2024-11-09 VITALS — BP 97/64 | HR 91 | Temp 97.2°F | Resp 18 | Wt 119.5 lb

## 2024-11-09 DIAGNOSIS — D631 Anemia in chronic kidney disease: Secondary | ICD-10-CM | POA: Insufficient documentation

## 2024-11-09 DIAGNOSIS — Z5112 Encounter for antineoplastic immunotherapy: Secondary | ICD-10-CM | POA: Insufficient documentation

## 2024-11-09 DIAGNOSIS — C9000 Multiple myeloma not having achieved remission: Secondary | ICD-10-CM

## 2024-11-09 DIAGNOSIS — N1832 Chronic kidney disease, stage 3b: Secondary | ICD-10-CM | POA: Diagnosis not present

## 2024-11-09 DIAGNOSIS — E538 Deficiency of other specified B group vitamins: Secondary | ICD-10-CM

## 2024-11-09 LAB — CBC WITH DIFFERENTIAL/PLATELET
Abs Immature Granulocytes: 0.04 K/uL (ref 0.00–0.07)
Basophils Absolute: 0 K/uL (ref 0.0–0.1)
Basophils Relative: 0 %
Eosinophils Absolute: 0.1 K/uL (ref 0.0–0.5)
Eosinophils Relative: 3 %
HCT: 30.3 % — ABNORMAL LOW (ref 36.0–46.0)
Hemoglobin: 9.7 g/dL — ABNORMAL LOW (ref 12.0–15.0)
Immature Granulocytes: 1 %
Lymphocytes Relative: 31 %
Lymphs Abs: 1.5 K/uL (ref 0.7–4.0)
MCH: 32.1 pg (ref 26.0–34.0)
MCHC: 32 g/dL (ref 30.0–36.0)
MCV: 100.3 fL — ABNORMAL HIGH (ref 80.0–100.0)
Monocytes Absolute: 0.3 K/uL (ref 0.1–1.0)
Monocytes Relative: 6 %
Neutro Abs: 2.9 K/uL (ref 1.7–7.7)
Neutrophils Relative %: 59 %
Platelets: 201 K/uL (ref 150–400)
RBC: 3.02 MIL/uL — ABNORMAL LOW (ref 3.87–5.11)
RDW: 19.2 % — ABNORMAL HIGH (ref 11.5–15.5)
Smear Review: NORMAL
WBC: 5 K/uL (ref 4.0–10.5)
nRBC: 0 % (ref 0.0–0.2)

## 2024-11-09 LAB — COMPREHENSIVE METABOLIC PANEL WITH GFR
ALT: 8 U/L (ref 0–44)
AST: 15 U/L (ref 15–41)
Albumin: 3.2 g/dL — ABNORMAL LOW (ref 3.5–5.0)
Alkaline Phosphatase: 133 U/L — ABNORMAL HIGH (ref 38–126)
Anion gap: 16 — ABNORMAL HIGH (ref 5–15)
BUN: 24 mg/dL — ABNORMAL HIGH (ref 8–23)
CO2: 15 mmol/L — ABNORMAL LOW (ref 22–32)
Calcium: 9.2 mg/dL (ref 8.9–10.3)
Chloride: 108 mmol/L (ref 98–111)
Creatinine, Ser: 1.18 mg/dL — ABNORMAL HIGH (ref 0.44–1.00)
GFR, Estimated: 45 mL/min — ABNORMAL LOW (ref >60)
Glucose, Bld: 131 mg/dL — ABNORMAL HIGH (ref 70–99)
Potassium: 3.6 mmol/L (ref 3.5–5.1)
Sodium: 139 mmol/L (ref 135–145)
Total Bilirubin: 0.7 mg/dL (ref 0.0–1.2)
Total Protein: 6.3 g/dL — ABNORMAL LOW (ref 6.5–8.1)

## 2024-11-09 LAB — MAGNESIUM: Magnesium: 2.3 mg/dL (ref 1.7–2.4)

## 2024-11-09 MED ORDER — BORTEZOMIB CHEMO SQ INJECTION 3.5 MG (2.5MG/ML)
1.3000 mg/m2 | Freq: Once | INTRAMUSCULAR | Status: AC
  Administered 2024-11-09: 2.25 mg via SUBCUTANEOUS
  Filled 2024-11-09: qty 0.9

## 2024-11-09 MED ORDER — EPOETIN ALFA-EPBX 20000 UNIT/ML IJ SOLN
20000.0000 [IU] | Freq: Once | INTRAMUSCULAR | Status: AC
  Administered 2024-11-09: 20000 [IU] via SUBCUTANEOUS
  Filled 2024-11-09: qty 1

## 2024-11-09 MED ORDER — DEXAMETHASONE 4 MG PO TABS
20.0000 mg | ORAL_TABLET | Freq: Once | ORAL | Status: AC
  Administered 2024-11-09: 20 mg via ORAL
  Filled 2024-11-09: qty 5

## 2024-11-09 MED ORDER — PROCHLORPERAZINE MALEATE 10 MG PO TABS
10.0000 mg | ORAL_TABLET | Freq: Once | ORAL | Status: AC
  Administered 2024-11-09: 10 mg via ORAL
  Filled 2024-11-09: qty 1

## 2024-11-09 NOTE — Patient Instructions (Signed)
 [facility] [place] - A DEPT OF [facility]  Discharge Instructions: Thank you for choosing [facility] to provide your oncology and hematology care.  If you have a lab appointment with the Cancer Center - please note that after April 8th, 2024, all labs will be drawn in the cancer center.  You do not have to check in or register with the main entrance as you have in the past but will complete your check-in in the cancer center.  Wear comfortable clothing and clothing appropriate for easy access to any Portacath or PICC line.   We strive to give you quality time with your provider. You may need to reschedule your appointment if you arrive late (15 or more minutes).  Arriving late affects you and other patients whose appointments are after yours.  Also, if you miss three or more appointments without notifying the office, you may be dismissed from the clinic at the provider's discretion.      For prescription refill requests, have your pharmacy contact our office and allow 72 hours for refills to be completed.    Today you received the following chemotherapy and/or immunotherapy agents Velcade  and Retacrit       To help prevent nausea and vomiting after your treatment, we encourage you to take your nausea medication as directed.  BELOW ARE SYMPTOMS THAT SHOULD BE REPORTED IMMEDIATELY: *FEVER GREATER THAN 100.4 F (38 C) OR HIGHER *CHILLS OR SWEATING *NAUSEA AND VOMITING THAT IS NOT CONTROLLED WITH YOUR NAUSEA MEDICATION *UNUSUAL SHORTNESS OF BREATH *UNUSUAL BRUISING OR BLEEDING *URINARY PROBLEMS (pain or burning when urinating, or frequent urination) *BOWEL PROBLEMS (unusual diarrhea, constipation, pain near the anus) TENDERNESS IN MOUTH AND THROAT WITH OR WITHOUT PRESENCE OF ULCERS (sore throat, sores in mouth, or a toothache) UNUSUAL RASH, SWELLING OR PAIN  UNUSUAL VAGINAL DISCHARGE OR ITCHING   Items with * indicate a potential emergency and should be  followed up as soon as possible or go to the Emergency Department if any problems should occur.  Please show the CHEMOTHERAPY ALERT CARD or IMMUNOTHERAPY ALERT CARD at check-in to the Emergency Department and triage nurse.  Should you have questions after your visit or need to cancel or reschedule your appointment, please contact [facility] [place] - A DEPT OF [name] [phone]  and follow the prompts.  Office hours are 8:00 a.m. to 4:30 p.m. Monday - Friday. Please note that voicemails left after 4:00 p.m. may not be returned until the following business day.  We are closed weekends and major holidays. You have access to a nurse at all times for urgent questions. Please call the main number to the clinic [phone] and follow the prompts.  For any non-urgent questions, you may also contact your provider using MyChart. We now offer e-Visits for anyone 70 and older to request care online for non-urgent symptoms. For details visit [email or web address].   Also download the MyChart app! Go to the app store, search MyChart, open the app, select [place], and log in with your MyChart username and password.

## 2024-11-09 NOTE — Progress Notes (Signed)
 Patient tolerated Velcade  and Retacrit  injection with no complaints voiced.  Lab work reviewed.  See MAR for details.  Injection site clean and dry with no bruising or swelling noted.  Patient stable during and after injection.  Band aid applied.  VSS.  Patient left in satisfactory condition with no s/s of distress noted.

## 2024-11-16 ENCOUNTER — Inpatient Hospital Stay

## 2024-11-17 ENCOUNTER — Telehealth: Payer: Self-pay | Admitting: Emergency Medicine

## 2024-11-17 NOTE — Telephone Encounter (Signed)
 Staff called to verify her Velcade  injection [email or web address]

## 2024-11-19 ENCOUNTER — Other Ambulatory Visit: Payer: Self-pay

## 2024-11-24 ENCOUNTER — Other Ambulatory Visit: Payer: Self-pay | Admitting: Oncology

## 2024-11-24 DIAGNOSIS — C9000 Multiple myeloma not having achieved remission: Secondary | ICD-10-CM

## 2024-11-26 ENCOUNTER — Other Ambulatory Visit: Payer: Self-pay | Admitting: Oncology

## 2024-11-26 DIAGNOSIS — C9000 Multiple myeloma not having achieved remission: Secondary | ICD-10-CM

## 2024-11-30 ENCOUNTER — Inpatient Hospital Stay: Admitting: Oncology

## 2024-11-30 ENCOUNTER — Inpatient Hospital Stay

## 2024-12-14 ENCOUNTER — Inpatient Hospital Stay

## 2024-12-14 ENCOUNTER — Inpatient Hospital Stay: Admitting: Oncology

## 2024-12-27 ENCOUNTER — Inpatient Hospital Stay

## 2024-12-28 DEATH — deceased
